# Patient Record
Sex: Female | Born: 1955
Health system: Southern US, Community
[De-identification: ages and names within clinical notes are randomized; demographics above are authoritative.]

## PROBLEM LIST (undated history)

## (undated) DIAGNOSIS — F4310 Post-traumatic stress disorder, unspecified: Secondary | ICD-10-CM

## (undated) DIAGNOSIS — E119 Type 2 diabetes mellitus without complications: Secondary | ICD-10-CM

## (undated) DIAGNOSIS — R32 Unspecified urinary incontinence: Secondary | ICD-10-CM

## (undated) DIAGNOSIS — F329 Major depressive disorder, single episode, unspecified: Secondary | ICD-10-CM

## (undated) DIAGNOSIS — G4733 Obstructive sleep apnea (adult) (pediatric): Secondary | ICD-10-CM

## (undated) DIAGNOSIS — D72829 Elevated white blood cell count, unspecified: Secondary | ICD-10-CM

## (undated) DIAGNOSIS — IMO0002 Reserved for concepts with insufficient information to code with codable children: Secondary | ICD-10-CM

## (undated) DIAGNOSIS — E785 Hyperlipidemia, unspecified: Secondary | ICD-10-CM

## (undated) DIAGNOSIS — E875 Hyperkalemia: Secondary | ICD-10-CM

## (undated) DIAGNOSIS — G4700 Insomnia, unspecified: Secondary | ICD-10-CM

## (undated) DIAGNOSIS — J309 Allergic rhinitis, unspecified: Secondary | ICD-10-CM

## (undated) DIAGNOSIS — I10 Essential (primary) hypertension: Secondary | ICD-10-CM

## (undated) DIAGNOSIS — E1351 Other specified diabetes mellitus with diabetic peripheral angiopathy without gangrene: Secondary | ICD-10-CM

## (undated) DIAGNOSIS — E1365 Other specified diabetes mellitus with hyperglycemia: Secondary | ICD-10-CM

## (undated) HISTORY — PX: CARPAL TUNNEL RELEASE: SHX101

## (undated) HISTORY — DX: Reserved for concepts with insufficient information to code with codable children: IMO0002

## (undated) HISTORY — DX: Other specified diabetes mellitus with diabetic peripheral angiopathy without gangrene: E13.51

## (undated) HISTORY — DX: Type 2 diabetes mellitus without complications: E11.9

## (undated) HISTORY — DX: Other specified diabetes mellitus with hyperglycemia: E13.65

## (undated) HISTORY — DX: Post-traumatic stress disorder, unspecified: F43.10

## (undated) HISTORY — DX: Essential (primary) hypertension: I10

## (undated) HISTORY — PX: COLONOSCOPY: SHX174

## (undated) HISTORY — DX: Allergic rhinitis, unspecified: J30.9

## (undated) HISTORY — DX: Insomnia, unspecified: G47.00

## (undated) HISTORY — DX: Hyperlipidemia, unspecified: E78.5

## (undated) HISTORY — DX: Hyperkalemia: E87.5

## (undated) HISTORY — DX: Elevated white blood cell count, unspecified: D72.829

## (undated) HISTORY — DX: Major depressive disorder, single episode, unspecified: F32.9

---

## 2016-01-10 DIAGNOSIS — E119 Type 2 diabetes mellitus without complications: Secondary | ICD-10-CM | POA: Diagnosis not present

## 2016-01-10 DIAGNOSIS — I10 Essential (primary) hypertension: Secondary | ICD-10-CM | POA: Diagnosis not present

## 2016-01-10 DIAGNOSIS — Z7984 Long term (current) use of oral hypoglycemic drugs: Secondary | ICD-10-CM | POA: Diagnosis not present

## 2016-01-10 DIAGNOSIS — Z794 Long term (current) use of insulin: Secondary | ICD-10-CM | POA: Diagnosis not present

## 2016-01-10 DIAGNOSIS — E781 Pure hyperglyceridemia: Secondary | ICD-10-CM | POA: Diagnosis not present

## 2016-01-10 DIAGNOSIS — R928 Other abnormal and inconclusive findings on diagnostic imaging of breast: Secondary | ICD-10-CM | POA: Diagnosis not present

## 2016-01-10 DIAGNOSIS — R413 Other amnesia: Secondary | ICD-10-CM | POA: Diagnosis not present

## 2016-01-11 ENCOUNTER — Other Ambulatory Visit: Payer: Self-pay | Admitting: Family Medicine

## 2016-01-11 DIAGNOSIS — R928 Other abnormal and inconclusive findings on diagnostic imaging of breast: Secondary | ICD-10-CM

## 2016-01-30 ENCOUNTER — Ambulatory Visit: Payer: Self-pay | Admitting: Neurology

## 2016-02-07 ENCOUNTER — Encounter: Payer: Self-pay | Admitting: Endocrinology

## 2016-02-07 ENCOUNTER — Ambulatory Visit (INDEPENDENT_AMBULATORY_CARE_PROVIDER_SITE_OTHER): Payer: Commercial Managed Care - HMO | Admitting: Endocrinology

## 2016-02-07 VITALS — BP 126/87 | HR 73 | Temp 97.6°F | Ht 65.5 in | Wt 220.0 lb

## 2016-02-07 DIAGNOSIS — J309 Allergic rhinitis, unspecified: Secondary | ICD-10-CM | POA: Diagnosis not present

## 2016-02-07 DIAGNOSIS — G4700 Insomnia, unspecified: Secondary | ICD-10-CM

## 2016-02-07 DIAGNOSIS — F329 Major depressive disorder, single episode, unspecified: Secondary | ICD-10-CM

## 2016-02-07 DIAGNOSIS — I10 Essential (primary) hypertension: Secondary | ICD-10-CM

## 2016-02-07 DIAGNOSIS — F32A Depression, unspecified: Secondary | ICD-10-CM

## 2016-02-07 NOTE — Patient Instructions (Addendum)
good diet and exercise significantly improve the control of your diabetes.  please let me know if you wish to be referred to a dietician.  high blood sugar is very risky to your health.  you should see an eye doctor and dentist every year.  It is very important to get all recommended vaccinations.  controlling your blood pressure and cholesterol drastically reduces the damage diabetes does to your body.  Those who smoke should quit.  please discuss these with your doctor.  check your blood sugar twice a day.  vary the time of day when you check, between before the 3 meals, and at bedtime.  also check if you have symptoms of your blood sugar being too high or too low.  please keep a record of the readings and bring it to your next appointment here (or you can bring the meter itself).  You can write it on any piece of paper.  please call us sooner if your blood sugar goes below 70, or if you have a lot of readings over 200.  For now, change the insulin to 55 units in the morning and 30 units in the evening. On this type of insulin schedule, you should eat meals on a regular schedule.  If a meal is missed or significantly delayed, your blood sugar could go low.   Please come back for a follow-up appointment in 1 month.  Please call us next week, to tell us how the blood sugar is doing.

## 2016-02-07 NOTE — Progress Notes (Signed)
Subjective:    Patient ID: Danielle Buchanan, female    DOB: [birth year], 60 y.o.   MRN: [id number]  HPI pt states DM was dx'ed in 1992; she has severe neuropathy of the lower extremities; she is unaware of any associated chronic complications; she has been on insulin since 1997; pt says her diet and exercise are "ok;"  she has never had GDM, pancreatitis, severe hypoglycemia or DKA.  She says she misses approx 3 doses of insulin per week.  She says she cannot afford insulin analogs.  She has been on 38 units BID, x the past year.  She says cbg's are in the 200's.   Past Medical History  Diagnosis Date  . Diabetes (HCC)   . Dyslipidemia   . HTN (hypertension)   . Allergic rhinitis   . Insomnia   . Depression     No past surgical history on file.  Social History   Social History  . Marital Status: N/A    Spouse Name: N/A  . Number of Children: N/A  . Years of Education: N/A   Occupational History  . Not on file.   Social History Main Topics  . Smoking status: Current Every Day Smoker  . Smokeless tobacco: Not on file  . Alcohol Use: No  . Drug Use: Not on file  . Sexual Activity: Not on file   Other Topics Concern  . Not on file   Social History Narrative    No current outpatient prescriptions on file prior to visit.   No current facility-administered medications on file prior to visit.    Allergies  Allergen Reactions  . Doxycycline Rash    Family History  Problem Relation Age of Onset  . Diabetes Neg Hx     BP 126/87 mmHg  Pulse 73  Temp(Src) 97.6 F (36.4 C) (Oral)  Ht 5' 5.5" (1.664 m)  Wt 220 lb (99.791 kg)  BMI 36.04 kg/m2  SpO2 91%   Review of Systems denies weight loss, blurry vision, headache, chest pain, sob, n/v, muscle cramps, cold intolerance, rhinorrhea, and easy bruising.  She has urinary frequency, memory loss, and diaphoresis.     Objective:   Physical Exam VS: see vs page GEN: no distress HEAD: head: no deformity eyes: no  periorbital swelling, no proptosis external nose and ears are normal mouth: no lesion seen NECK: supple, thyroid is not enlarged CHEST WALL: no deformity LUNGS: clear to auscultation CV: reg rate and rhythm, no murmur ABD: abdomen is soft, nontender.  no hepatosplenomegaly.  not distended.  no hernia MUSCULOSKELETAL: muscle bulk and strength are grossly normal.  no obvious joint swelling.  gait is normal and steady EXTEMITIES: no deformity.  no ulcer on the feet.  feet are of normal color and temp.  no edema.  Both great toenails are absent.  At the plantar aspect of the left foot, there is a cracked callus.   PULSES: dorsalis pedis intact bilat.  no carotid bruit NEURO:  cn 2-12 grossly intact.   readily moves all 4's.  sensation is intact to touch on the feet, but severely decreased from normal SKIN:  Normal texture and temperature.  No rash or suspicious lesion is visible.   NODES:  None palpable at the neck PSYCH: alert, well-oriented.  Does not appear anxious nor depressed.   I have reviewed outside records, and summarized: Pt was noted to have elevated a1c, and referred here.  outside test results are reviewed: A1c=10%    Assessment &  Plan:  DM: severe exacerbation. HTN: new to me: well-controlled.  Please continue the same medication.   Patient is advised the following: Patient Instructions  good diet and exercise significantly improve the control of your diabetes.  please let me know if you wish to be referred to a dietician.  high blood sugar is very risky to your health.  you should see an eye doctor and dentist every year.  It is very important to get all recommended vaccinations.  controlling your blood pressure and cholesterol drastically reduces the damage diabetes does to your body.  Those who smoke should quit.  please discuss these with your doctor.  check your blood sugar twice a day.  vary the time of day when you check, between before the 3 meals, and at bedtime.   also check if you have symptoms of your blood sugar being too high or too low.  please keep a record of the readings and bring it to your next appointment here (or you can bring the meter itself).  You can write it on any piece of paper.  please call us sooner if your blood sugar goes below 70, or if you have a lot of readings over 200.  For now, change the insulin to 55 units in the morning and 30 units in the evening. On this type of insulin schedule, you should eat meals on a regular schedule.  If a meal is missed or significantly delayed, your blood sugar could go low.   Please come back for a follow-up appointment in 1 month.  Please call us next week, to tell us how the blood sugar is doing.

## 2016-02-08 ENCOUNTER — Encounter: Payer: Self-pay | Admitting: Endocrinology

## 2016-02-08 DIAGNOSIS — G4700 Insomnia, unspecified: Secondary | ICD-10-CM | POA: Insufficient documentation

## 2016-02-08 DIAGNOSIS — I10 Essential (primary) hypertension: Secondary | ICD-10-CM | POA: Insufficient documentation

## 2016-02-08 DIAGNOSIS — E119 Type 2 diabetes mellitus without complications: Secondary | ICD-10-CM | POA: Insufficient documentation

## 2016-02-08 DIAGNOSIS — J309 Allergic rhinitis, unspecified: Secondary | ICD-10-CM | POA: Insufficient documentation

## 2016-02-08 DIAGNOSIS — F329 Major depressive disorder, single episode, unspecified: Secondary | ICD-10-CM | POA: Insufficient documentation

## 2016-02-13 DIAGNOSIS — E781 Pure hyperglyceridemia: Secondary | ICD-10-CM | POA: Diagnosis not present

## 2016-02-15 ENCOUNTER — Encounter: Payer: Self-pay | Admitting: Neurology

## 2016-02-15 ENCOUNTER — Other Ambulatory Visit (INDEPENDENT_AMBULATORY_CARE_PROVIDER_SITE_OTHER): Payer: Commercial Managed Care - HMO

## 2016-02-15 ENCOUNTER — Ambulatory Visit (INDEPENDENT_AMBULATORY_CARE_PROVIDER_SITE_OTHER): Payer: Commercial Managed Care - HMO | Admitting: Neurology

## 2016-02-15 VITALS — BP 124/70 | HR 75 | Ht 65.5 in | Wt 227.1 lb

## 2016-02-15 DIAGNOSIS — F329 Major depressive disorder, single episode, unspecified: Secondary | ICD-10-CM | POA: Diagnosis not present

## 2016-02-15 DIAGNOSIS — F17200 Nicotine dependence, unspecified, uncomplicated: Secondary | ICD-10-CM | POA: Diagnosis not present

## 2016-02-15 DIAGNOSIS — E1142 Type 2 diabetes mellitus with diabetic polyneuropathy: Secondary | ICD-10-CM

## 2016-02-15 DIAGNOSIS — R413 Other amnesia: Secondary | ICD-10-CM

## 2016-02-15 DIAGNOSIS — F32A Depression, unspecified: Secondary | ICD-10-CM

## 2016-02-15 LAB — VITAMIN B12: Vitamin B-12: 294 pg/mL (ref 211–911)

## 2016-02-15 LAB — TSH: TSH: 2.34 u[IU]/mL (ref 0.35–4.50)

## 2016-02-15 NOTE — Patient Instructions (Addendum)
1.  MRI brain without contrast 2.  Check blood work 3.  Neuropsychological testing 4.  It is important for you to have regular sleep and diet habits.  Advised to exercise daily and engage in mentally stimulating activities such as puzzles to maintain your brain health 5.  Stop smoking  Return to clinic in 4 months
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
## 2016-02-16 NOTE — Progress Notes (Signed)
Note routed

## 2016-02-23 ENCOUNTER — Inpatient Hospital Stay: Admission: RE | Admit: 2016-02-23 | Payer: 59 | Source: Ambulatory Visit

## 2016-02-23 ENCOUNTER — Ambulatory Visit
Admission: RE | Admit: 2016-02-23 | Discharge: 2016-02-23 | Disposition: A | Discharge status: Home / Self Care | Payer: Commercial Managed Care - HMO | Source: Ambulatory Visit | Attending: Neurology | Admitting: Neurology

## 2016-02-23 DIAGNOSIS — F329 Major depressive disorder, single episode, unspecified: Secondary | ICD-10-CM

## 2016-02-23 DIAGNOSIS — R413 Other amnesia: Secondary | ICD-10-CM | POA: Diagnosis not present

## 2016-02-23 DIAGNOSIS — F17200 Nicotine dependence, unspecified, uncomplicated: Secondary | ICD-10-CM

## 2016-02-23 DIAGNOSIS — F32A Depression, unspecified: Secondary | ICD-10-CM

## 2016-02-23 DIAGNOSIS — E1142 Type 2 diabetes mellitus with diabetic polyneuropathy: Secondary | ICD-10-CM

## 2016-03-02 DIAGNOSIS — R413 Other amnesia: Secondary | ICD-10-CM | POA: Diagnosis not present

## 2016-03-09 ENCOUNTER — Ambulatory Visit: Payer: 59 | Admitting: Endocrinology

## 2016-04-11 ENCOUNTER — Ambulatory Visit: Payer: 59 | Admitting: Endocrinology

## 2016-04-12 ENCOUNTER — Encounter: Payer: Commercial Managed Care - HMO | Admitting: Psychology

## 2016-04-12 ENCOUNTER — Ambulatory Visit (INDEPENDENT_AMBULATORY_CARE_PROVIDER_SITE_OTHER): Payer: Commercial Managed Care - HMO | Admitting: Psychology

## 2016-04-12 ENCOUNTER — Encounter: Payer: Self-pay | Admitting: Psychology

## 2016-04-12 DIAGNOSIS — F329 Major depressive disorder, single episode, unspecified: Secondary | ICD-10-CM | POA: Diagnosis not present

## 2016-04-12 DIAGNOSIS — R413 Other amnesia: Secondary | ICD-10-CM

## 2016-04-12 DIAGNOSIS — F32A Depression, unspecified: Secondary | ICD-10-CM
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
## 2016-04-16 ENCOUNTER — Ambulatory Visit (INDEPENDENT_AMBULATORY_CARE_PROVIDER_SITE_OTHER): Payer: Commercial Managed Care - HMO | Admitting: Psychology

## 2016-04-16 DIAGNOSIS — F32A Depression, unspecified: Secondary | ICD-10-CM

## 2016-04-16 DIAGNOSIS — F329 Major depressive disorder, single episode, unspecified: Secondary | ICD-10-CM

## 2016-04-16 DIAGNOSIS — R413 Other amnesia: Secondary | ICD-10-CM | POA: Diagnosis not present

## 2016-04-16 NOTE — Progress Notes (Signed)
   Neuropsychology Note  Danielle Buchanan Ganim returned today for 2 hours of neuropsychological testing with technician, Wallace Kellerana Chamberlain, BS, under the supervision of Dr. Elvis CoilMaryBeth Bailar. The patient did not appear overtly distressed by the testing session, per behavioral observation as reported to me or via self-report to the technician. Rest breaks were offered. Danielle Buchanan Baxley will return within 2 weeks for a feedback session with Dr. Alinda DoomsBailar at which time her test performances, clinical impressions and treatment recommendations will be reviewed in detail. she understands she can contact our office should she require my assistance before this time.  Full report to follow.

## 2016-04-25 ENCOUNTER — Ambulatory Visit: Payer: Commercial Managed Care - HMO | Admitting: Endocrinology

## 2016-04-26 ENCOUNTER — Encounter: Payer: Commercial Managed Care - HMO | Admitting: Psychology

## 2016-04-26 ENCOUNTER — Ambulatory Visit (INDEPENDENT_AMBULATORY_CARE_PROVIDER_SITE_OTHER): Payer: Commercial Managed Care - HMO | Admitting: Psychology

## 2016-04-26 DIAGNOSIS — R413 Other amnesia: Secondary | ICD-10-CM | POA: Diagnosis not present

## 2016-04-26 DIAGNOSIS — F4312 Post-traumatic stress disorder, chronic: Secondary | ICD-10-CM | POA: Diagnosis not present

## 2016-04-26 DIAGNOSIS — F329 Major depressive disorder, single episode, unspecified: Secondary | ICD-10-CM

## 2016-04-26 DIAGNOSIS — F32A Depression, unspecified: Secondary | ICD-10-CM
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
## 2016-04-30 ENCOUNTER — Telehealth: Payer: Self-pay | Admitting: Neurology

## 2016-04-30 ENCOUNTER — Ambulatory Visit (INDEPENDENT_AMBULATORY_CARE_PROVIDER_SITE_OTHER): Payer: Commercial Managed Care - HMO | Admitting: Endocrinology

## 2016-04-30 VITALS — BP 117/70 | HR 66 | Temp 98.4°F | Ht 64.0 in | Wt 224.0 lb

## 2016-04-30 DIAGNOSIS — E1142 Type 2 diabetes mellitus with diabetic polyneuropathy: Secondary | ICD-10-CM | POA: Diagnosis not present

## 2016-04-30 DIAGNOSIS — Z794 Long term (current) use of insulin: Secondary | ICD-10-CM

## 2016-04-30 DIAGNOSIS — E1310 Other specified diabetes mellitus with ketoacidosis without coma: Secondary | ICD-10-CM | POA: Diagnosis not present

## 2016-04-30 LAB — POCT GLYCOSYLATED HEMOGLOBIN (HGB A1C): Hemoglobin A1C: 10.7

## 2016-04-30 NOTE — Progress Notes (Signed)
Subjective:    Patient ID: Danielle Buchanan, female    DOB: 06-16-1956, 60 y.o.   MRN: [id number]  HPI Pt returns for f/u of diabetes mellitus: DM type: Insulin-requiring type 2 Dx'ed: 1992 Complications: polyneuropathy Therapy: insulin since 1997 GDM: never DKA: never Severe hypoglycemia: never Pancreatitis: never Other: she says she cannot afford insulin analogs Interval history: she brings a record of her cbg's which i have reviewed today.  It varies from 176-338.  She checks in am only.  pt states she feels well in general. Past Medical History:  Diagnosis Date  . Allergic rhinitis   . Depression   . Diabetes (HCC)   . Dyslipidemia   . HTN (hypertension)   . Insomnia     Past Surgical History:  Procedure Laterality Date  . CARPAL TUNNEL RELEASE Right     Social History   Social History  . Marital status: Single    Spouse name: N/A  . Number of children: N/A  . Years of education: N/A   Occupational History  . Not on file.   Social History Main Topics  . Smoking status: Current Every Day Smoker    Packs/day: 1.00    Years: 51.00    Types: Cigarettes  . Smokeless tobacco: Never Used  . Alcohol use No  . Drug use: No  . Sexual activity: Not on file   Other Topics Concern  . Not on file   Social History Narrative   Lives with sister in an apartment on the first floor.  Has no children.    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    Education: 3rd grade.    Current Outpatient Prescriptions on File Prior to Visit  Medication Sig Dispense Refill  . atorvastatin (LIPITOR) 40 MG tablet     . B-D INS SYR ULTRAFINE 1CC/31G 31G X 5/16" 1 ML MISC     . buPROPion (WELLBUTRIN XL) 300 MG 24 hr tablet     . cetirizine (ZYRTEC) 10 MG tablet     . fenofibrate micronized (LOFIBRA) 134 MG capsule     . FLUoxetine (PROZAC) 40 MG capsule     . Insulin NPH Human, Isophane, (NOVOLIN N [place]) 55 units in the morning and 30 units in  the evening.    Marland Kitchen losartan (COZAAR) 100 MG tablet Take 100 mg by mouth daily.    . metFORMIN (GLUCOPHAGE) 1000 MG tablet     . traZODone (DESYREL) 50 MG tablet Take 50 mg by mouth at bedtime.    . TRUE METRIX BLOOD GLUCOSE TEST test strip      No current facility-administered medications on file prior to visit.     Allergies  Allergen Reactions  . Doxycycline Rash    Family History  Problem Relation Age of Onset  . Diabetes Neg Hx   . Heart attack Sister   . Heart attack Brother   . Heart attack Mother   . Heart attack Father   . Alcohol abuse Mother   . Alcohol abuse Father   . Hypercholesterolemia Sister     BP 117/70   Pulse 66   Temp 98.4 F (36.9 C) (Oral)   Ht  (1.626 m)   Wt 224 lb (101.6 kg)   SpO2 98%   BMI 38.45 kg/m   Review of Systems She denies hypoglycemia    Objective:   Physical Exam VITAL SIGNS:  See vs page GENERAL: no distress Pulses: dorsalis  pedis intact bilat.   MSK: no deformity of the feet CV: no leg edema Skin:  no ulcer on the feet.  normal color and temp on the feet. Neuro: sensation is intact to touch on the feet, but severely decreased from normal EXTEMITIES: both great toenails are absent. There is bilateral onychomycosis of the toenails.     Lab Results  Component Value Date   HGBA1C 10.7 04/30/2016      Assessment & Plan:  Insulin-requiring type 2 DM: she needs increased rx Noncompliance with cbg recording: i'll do the beat I can for now, but I told pt as cbg's improve, cbg's will become more important.  Patient is advised the following: Patient Instructions  check your blood sugar twice a day.  vary the time of day when you check, between before the 3 meals, and at bedtime.  also check if you have symptoms of your blood sugar being too high or too low.  please keep a record of the readings and bring it to your next appointment here (or you can bring the meter itself).  You can write it on any piece of paper.  please  call us sooner if your blood sugar goes below 70, or if you have a lot of readings over 200.  For now, please increase the insulin to 65 units in the morning and 40 units in the evening. On this type of insulin schedule, you should eat meals on a regular schedule.  If a meal is missed or significantly delayed, your blood sugar could go low.   Please come back for a follow-up appointment in 6 weeks.  Please see our dietician the same day. Please call us next week, to tell us how the blood sugar is doing.    [name], MD

## 2016-04-30 NOTE — Telephone Encounter (Signed)
Patient requested for appointment to be cancelled.

## 2016-04-30 NOTE — Patient Instructions (Addendum)
check your blood sugar twice a day.  vary the time of day when you check, between before the 3 meals, and at bedtime.  also check if you have symptoms of your blood sugar being too high or too low.  please keep a record of the readings and bring it to your next appointment here (or you can bring the meter itself).  You can write it on any piece of paper.  please call us sooner if your blood sugar goes below 70, or if you have a lot of readings over 200.  For now, please increase the insulin to 65 units in the morning and 40 units in the evening. On this type of insulin schedule, you should eat meals on a regular schedule.  If a meal is missed or significantly delayed, your blood sugar could go low.   Please come back for a follow-up appointment in 6 weeks.  Please see our dietician the same day. Please call us next week, to tell us how the blood sugar is doing.

## 2016-05-14 ENCOUNTER — Telehealth: Payer: Self-pay | Admitting: Endocrinology

## 2016-05-14 NOTE — Telephone Encounter (Signed)
PT called wants to make sure she is taking the correct amount of insulin, she thinks that he increased her dose and needs to double check.  Requests call back.

## 2016-05-16 NOTE — Telephone Encounter (Signed)
Called and advised patient of Dr.Ellison's last note regarding to increase insulin to 65 units in the AM and 40 units in the evening. Patient stated that what she had been doing and had no other questions.

## 2016-05-16 NOTE — Telephone Encounter (Signed)
PT called again, wondering how much insulin she needs to take, please call back.

## 2016-05-28 DIAGNOSIS — R413 Other amnesia: Secondary | ICD-10-CM | POA: Diagnosis not present

## 2016-05-28 DIAGNOSIS — F4310 Post-traumatic stress disorder, unspecified: Secondary | ICD-10-CM | POA: Diagnosis not present

## 2016-05-28 DIAGNOSIS — F331 Major depressive disorder, recurrent, moderate: Secondary | ICD-10-CM | POA: Diagnosis not present

## 2016-05-28 DIAGNOSIS — E781 Pure hyperglyceridemia: Secondary | ICD-10-CM | POA: Diagnosis not present

## 2016-05-28 DIAGNOSIS — S069X0S Unspecified intracranial injury without loss of consciousness, sequela: Secondary | ICD-10-CM | POA: Diagnosis not present

## 2016-06-12 ENCOUNTER — Telehealth: Payer: Self-pay | Admitting: Endocrinology

## 2016-06-12 NOTE — Telephone Encounter (Signed)
See message to be advised.  

## 2016-06-12 NOTE — Telephone Encounter (Signed)
Pt states her BS has gone down since the change in the insulin so she is cancelling her appt because according to her conversation with Dr. Everardo AllEllison, she did not need to come back for an appt

## 2016-06-14 ENCOUNTER — Ambulatory Visit: Payer: Commercial Managed Care - HMO | Admitting: Endocrinology

## 2016-06-14 ENCOUNTER — Encounter: Payer: Commercial Managed Care - HMO | Admitting: Dietician

## 2016-06-18 ENCOUNTER — Ambulatory Visit: Payer: 59 | Admitting: Neurology

## 2016-07-02 DIAGNOSIS — Z794 Long term (current) use of insulin: Secondary | ICD-10-CM | POA: Diagnosis not present

## 2016-07-02 DIAGNOSIS — Z23 Encounter for immunization: Secondary | ICD-10-CM | POA: Diagnosis not present

## 2016-07-02 DIAGNOSIS — F5101 Primary insomnia: Secondary | ICD-10-CM | POA: Diagnosis not present

## 2016-07-02 DIAGNOSIS — E119 Type 2 diabetes mellitus without complications: Secondary | ICD-10-CM | POA: Diagnosis not present

## 2016-07-02 DIAGNOSIS — F331 Major depressive disorder, recurrent, moderate: Secondary | ICD-10-CM | POA: Diagnosis not present

## 2016-07-09 DIAGNOSIS — Z9114 Patient's other noncompliance with medication regimen: Secondary | ICD-10-CM | POA: Diagnosis not present

## 2016-07-09 DIAGNOSIS — E119 Type 2 diabetes mellitus without complications: Secondary | ICD-10-CM | POA: Diagnosis not present

## 2016-07-09 DIAGNOSIS — I10 Essential (primary) hypertension: Secondary | ICD-10-CM | POA: Diagnosis not present

## 2016-07-09 DIAGNOSIS — F5101 Primary insomnia: Secondary | ICD-10-CM | POA: Diagnosis not present

## 2016-07-09 DIAGNOSIS — R413 Other amnesia: Secondary | ICD-10-CM | POA: Diagnosis not present

## 2016-07-17 DIAGNOSIS — I10 Essential (primary) hypertension: Secondary | ICD-10-CM | POA: Diagnosis not present

## 2016-07-17 DIAGNOSIS — Z9114 Patient's other noncompliance with medication regimen: Secondary | ICD-10-CM | POA: Diagnosis not present

## 2016-07-17 DIAGNOSIS — R413 Other amnesia: Secondary | ICD-10-CM | POA: Diagnosis not present

## 2016-07-17 DIAGNOSIS — F5101 Primary insomnia: Secondary | ICD-10-CM | POA: Diagnosis not present

## 2016-07-17 DIAGNOSIS — E119 Type 2 diabetes mellitus without complications: Secondary | ICD-10-CM | POA: Diagnosis not present

## 2016-07-24 DIAGNOSIS — Z9114 Patient's other noncompliance with medication regimen: Secondary | ICD-10-CM | POA: Diagnosis not present

## 2016-07-24 DIAGNOSIS — E119 Type 2 diabetes mellitus without complications: Secondary | ICD-10-CM | POA: Diagnosis not present

## 2016-07-24 DIAGNOSIS — R413 Other amnesia: Secondary | ICD-10-CM | POA: Diagnosis not present

## 2016-07-24 DIAGNOSIS — I10 Essential (primary) hypertension: Secondary | ICD-10-CM | POA: Diagnosis not present

## 2016-07-24 DIAGNOSIS — F5101 Primary insomnia: Secondary | ICD-10-CM | POA: Diagnosis not present

## 2016-07-31 DIAGNOSIS — E119 Type 2 diabetes mellitus without complications: Secondary | ICD-10-CM | POA: Diagnosis not present

## 2016-07-31 DIAGNOSIS — I10 Essential (primary) hypertension: Secondary | ICD-10-CM | POA: Diagnosis not present

## 2016-07-31 DIAGNOSIS — Z9114 Patient's other noncompliance with medication regimen: Secondary | ICD-10-CM | POA: Diagnosis not present

## 2016-07-31 DIAGNOSIS — R413 Other amnesia: Secondary | ICD-10-CM | POA: Diagnosis not present

## 2016-07-31 DIAGNOSIS — F5101 Primary insomnia: Secondary | ICD-10-CM | POA: Diagnosis not present

## 2016-08-07 DIAGNOSIS — I10 Essential (primary) hypertension: Secondary | ICD-10-CM | POA: Diagnosis not present

## 2016-08-07 DIAGNOSIS — F5101 Primary insomnia: Secondary | ICD-10-CM | POA: Diagnosis not present

## 2016-08-07 DIAGNOSIS — E119 Type 2 diabetes mellitus without complications: Secondary | ICD-10-CM | POA: Diagnosis not present

## 2016-08-07 DIAGNOSIS — R413 Other amnesia: Secondary | ICD-10-CM | POA: Diagnosis not present

## 2016-08-07 DIAGNOSIS — Z9114 Patient's other noncompliance with medication regimen: Secondary | ICD-10-CM | POA: Diagnosis not present

## 2017-05-03 DIAGNOSIS — M79672 Pain in left foot: Secondary | ICD-10-CM | POA: Diagnosis not present

## 2017-05-03 DIAGNOSIS — L089 Local infection of the skin and subcutaneous tissue, unspecified: Secondary | ICD-10-CM | POA: Diagnosis not present

## 2017-05-03 DIAGNOSIS — E1165 Type 2 diabetes mellitus with hyperglycemia: Secondary | ICD-10-CM | POA: Diagnosis not present

## 2017-05-03 DIAGNOSIS — Z794 Long term (current) use of insulin: Secondary | ICD-10-CM | POA: Diagnosis not present

## 2017-05-03 DIAGNOSIS — E781 Pure hyperglyceridemia: Secondary | ICD-10-CM | POA: Diagnosis not present

## 2017-05-03 DIAGNOSIS — E119 Type 2 diabetes mellitus without complications: Secondary | ICD-10-CM | POA: Diagnosis not present

## 2017-05-03 DIAGNOSIS — E782 Mixed hyperlipidemia: Secondary | ICD-10-CM | POA: Diagnosis not present

## 2017-05-03 DIAGNOSIS — M79671 Pain in right foot: Secondary | ICD-10-CM | POA: Diagnosis not present

## 2017-05-08 ENCOUNTER — Other Ambulatory Visit (HOSPITAL_COMMUNITY): Payer: Self-pay | Admitting: Family Medicine

## 2017-05-08 DIAGNOSIS — M79672 Pain in left foot: Secondary | ICD-10-CM

## 2017-05-08 DIAGNOSIS — M79671 Pain in right foot: Secondary | ICD-10-CM

## 2017-05-09 ENCOUNTER — Ambulatory Visit (INDEPENDENT_AMBULATORY_CARE_PROVIDER_SITE_OTHER)
Admission: RE | Admit: 2017-05-09 | Discharge: 2017-05-09 | Disposition: A | Discharge status: Home / Self Care | Payer: Medicare HMO | Source: Ambulatory Visit | Attending: Vascular Surgery | Admitting: Vascular Surgery

## 2017-05-09 ENCOUNTER — Ambulatory Visit (HOSPITAL_COMMUNITY)
Admission: RE | Admit: 2017-05-09 | Discharge: 2017-05-09 | Disposition: A | Discharge status: Home / Self Care | Payer: Medicare HMO | Source: Ambulatory Visit | Attending: Vascular Surgery | Admitting: Vascular Surgery

## 2017-05-09 DIAGNOSIS — R931 Abnormal findings on diagnostic imaging of heart and coronary circulation: Secondary | ICD-10-CM | POA: Diagnosis not present

## 2017-05-09 DIAGNOSIS — M79672 Pain in left foot: Secondary | ICD-10-CM | POA: Diagnosis not present

## 2017-05-09 DIAGNOSIS — M79671 Pain in right foot: Secondary | ICD-10-CM | POA: Diagnosis not present

## 2017-05-09 LAB — VAS US LOWER EXTREMITY ARTERIAL DUPLEX
LATIBDISTSYS: -17 cm/s
LSFPPSV: 138 cm/s
Left super femoral dist sys PSV: -143 cm/s
Left super femoral mid sys PSV: -108 cm/s
RATIBDISTSYS: 94 cm/s
RIGHT POST TIB DIST SYS: 57 cm/s
RSFDPSV: -98 cm/s
Right super femoral mid sys PSV: -117 cm/s
Right super femoral prox sys PSV: 106 cm/s
left post tibial dist sys: -59 cm/s

## 2017-05-13 ENCOUNTER — Encounter (HOSPITAL_COMMUNITY): Payer: Commercial Managed Care - HMO

## 2017-05-24 ENCOUNTER — Encounter: Payer: Self-pay | Admitting: Endocrinology

## 2017-05-24 ENCOUNTER — Ambulatory Visit (INDEPENDENT_AMBULATORY_CARE_PROVIDER_SITE_OTHER): Payer: Medicare HMO | Admitting: Endocrinology

## 2017-05-24 VITALS — BP 132/86 | HR 84 | Wt 234.2 lb

## 2017-05-24 DIAGNOSIS — E1142 Type 2 diabetes mellitus with diabetic polyneuropathy: Secondary | ICD-10-CM

## 2017-05-24 DIAGNOSIS — R21 Rash and other nonspecific skin eruption: Secondary | ICD-10-CM | POA: Insufficient documentation

## 2017-05-24 DIAGNOSIS — Z794 Long term (current) use of insulin: Secondary | ICD-10-CM

## 2017-05-24 LAB — POCT GLYCOSYLATED HEMOGLOBIN (HGB A1C): Hemoglobin A1C: 12.6

## 2017-05-24 MED ORDER — INSULIN NPH ISOPHANE & REGULAR (70-30) 100 UNIT/ML ~~LOC~~ SUSP: SUBCUTANEOUS | 11 refills | Status: DC

## 2017-05-24 NOTE — Patient Instructions (Addendum)
check your blood sugar twice a day.  vary the time of day when you check, between before the 3 meals, and at bedtime.  also check if you have symptoms of your blood sugar being too high or too low.  please keep a record of the readings and bring it to your next appointment here (or you can bring the meter itself).  You can write it on any piece of paper.  please call us sooner if your blood sugar goes below 70, or if you have a lot of readings over 200.  For now, please increase the insulin to 120 units with breakfast, and 90 units with the evening meal. On this type of insulin schedule, you should eat meals on a regular schedule.  If a meal is missed or significantly delayed, your blood sugar could go low.   Please come back for a follow-up appointment in 2 months.  Please call us next week, to tell us how the blood sugar is doing.   Please see a skin specialist.  you will receive a phone call, about a day and time for an appointment.

## 2017-05-24 NOTE — Progress Notes (Signed)
Subjective:    Patient ID: Danielle Buchanan, female    DOB: 1956/06/06, 61 y.o.   MRN: [id number]  HPI Pt returns for f/u of diabetes mellitus: DM type: Insulin-requiring type 2 Dx'ed: 1992 Complications: polyneuropathy and PAD Therapy: insulin since 1997 GDM: never DKA: never Severe hypoglycemia: never.  Pancreatitis: never Other: she says she cannot afford insulin analogs.   Interval history: no cbg record, but states cbg's are in the 200's.  It is in general higher as the day goes on.  She says she never misses the insulin.  She takes 70/30, 90 units BID.  She has been on this dosage x a few weeks.  She says a vial lasts a few days.  Past Medical History:  Diagnosis Date  . Allergic rhinitis   . Depression   . Diabetes (HCC)   . Dyslipidemia   . HTN (hypertension)   . Insomnia     Past Surgical History:  Procedure Laterality Date  . CARPAL TUNNEL RELEASE Right     Social History   Social History  . Marital status: Single    Spouse name: N/A  . Number of children: N/A  . Years of education: N/A   Occupational History  . Not on file.   Social History Main Topics  . Smoking status: Current Every Day Smoker    Packs/day: 1.00    Years: 51.00    Types: Cigarettes  . Smokeless tobacco: Never Used  . Alcohol use No  . Drug use: No  . Sexual activity: Not on file   Other Topics Concern  . Not on file   Social History Narrative   Lives with sister in an apartment on the first floor.  Has no children.    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    Education: 3rd grade.    Current Outpatient Prescriptions on File Prior to Visit  Medication Sig Dispense Refill  . atorvastatin (LIPITOR) 40 MG tablet     . B-D INS SYR ULTRAFINE 1CC/31G 31G X 5/16" 1 ML MISC     . losartan (COZAAR) 100 MG tablet Take 100 mg by mouth daily.    . traZODone (DESYREL) 50 MG tablet Take 50 mg by mouth at bedtime.    . TRUE METRIX BLOOD GLUCOSE  TEST test strip     . buPROPion (WELLBUTRIN XL) 300 MG 24 hr tablet     . cetirizine (ZYRTEC) 10 MG tablet     . fenofibrate micronized (LOFIBRA) 134 MG capsule     . FLUoxetine (PROZAC) 40 MG capsule     . Insulin NPH Human, Isophane, (NOVOLIN N Vansant) 55 units in the morning and 30 units in the evening.     No current facility-administered medications on file prior to visit.     Allergies  Allergen Reactions  . Doxycycline Rash    Family History  Problem Relation Age of Onset  . Diabetes Neg Hx   . Heart attack Sister   . Heart attack Brother   . Heart attack Mother   . Heart attack Father   . Alcohol abuse Mother   . Alcohol abuse Father   . Hypercholesterolemia Sister     BP 132/86   Pulse 84   Wt 234 lb 3.2 oz (106.2 kg)   SpO2 94%   BMI 40.20 kg/m    Review of Systems She denies hypoglycemia.  She has a rash on the feet (does not  recall burning herself)    Objective:   Physical Exam VITAL SIGNS:  See vs page GENERAL: no distress Pulses: dorsalis pedis intact bilat.   MSK: no deformity of the feet CV: no leg edema Skin:  no ulcer on the feet, but there is severe erythema and exfoliation of the distal aspect of both feet.  normal color and temp on the feet. Neuro: sensation is intact to touch on the feet, but severely decreased from normal.   EXTEMITIES: both great toenails are absent. There is bilateral onychomycosis of the toenails.     (i reviewed arterial doppler report).    Lab Results  Component Value Date   HGBA1C 12.6 05/24/2017      Assessment & Plan:  Insulin-requiring type 2 DM, with polyneuropathy: ongoing poor control Noncompliance with cbg recording and f/u ov's.  This complicates the rx of DM Rash, new to me, uncertain etiology PAD: this degree does not explain the rash.  Patient Instructions  check your blood sugar twice a day.  vary the time of day when you check, between before the 3 meals, and at bedtime.  also check if you have  symptoms of your blood sugar being too high or too low.  please keep a record of the readings and bring it to your next appointment here (or you can bring the meter itself).  You can write it on any piece of paper.  please call us sooner if your blood sugar goes below 70, or if you have a lot of readings over 200.  For now, please increase the insulin to 120 units with breakfast, and 90 units with the evening meal. On this type of insulin schedule, you should eat meals on a regular schedule.  If a meal is missed or significantly delayed, your blood sugar could go low.   Please come back for a follow-up appointment in 2 months.  Please call us next week, to tell us how the blood sugar is doing.   Please see a skin specialist.  you will receive a phone call, about a day and time for an appointment.

## 2017-05-27 ENCOUNTER — Telehealth: Payer: Self-pay | Admitting: Endocrinology

## 2017-05-27 NOTE — Telephone Encounter (Signed)
Called patient & told her because referrals take time to call PCP to see if they could see her sooner.

## 2017-05-27 NOTE — Telephone Encounter (Signed)
See message.  Thanks.

## 2017-05-27 NOTE — Telephone Encounter (Signed)
See below.  Thank you

## 2017-05-27 NOTE — Telephone Encounter (Signed)
Patient has questions about her dermatology referral. Call patient at [phone] to advise, okay to leave a detailed message on phone. Patient requested a call this morning, I did not guarantee the patient that it would be possible due to patient load however, that I would put the note in the chart.

## 2017-05-27 NOTE — Telephone Encounter (Signed)
I did referral.  However, besides that, I don't know what to do.  This is outside the scope of my practice, so you would have to ask PCP.

## 2017-05-27 NOTE — Telephone Encounter (Signed)
Patient called to check the status of the note below. I advised the patient that the nurse is waiting to hear back from Dr. Everardo All on where she needs to go for her referral and someone will call once they have an update. Please advise.

## 2017-05-27 NOTE — Telephone Encounter (Signed)
Called patient & wanted referral status. I told her that I would inquire as to where it was placed, as well as seek advice on what to do about her worsening rash.

## 2017-05-28 DIAGNOSIS — L03115 Cellulitis of right lower limb: Secondary | ICD-10-CM | POA: Diagnosis not present

## 2017-05-28 DIAGNOSIS — G629 Polyneuropathy, unspecified: Secondary | ICD-10-CM | POA: Diagnosis not present

## 2017-05-28 DIAGNOSIS — L03116 Cellulitis of left lower limb: Secondary | ICD-10-CM | POA: Diagnosis not present

## 2017-05-30 DIAGNOSIS — Z7984 Long term (current) use of oral hypoglycemic drugs: Secondary | ICD-10-CM | POA: Diagnosis not present

## 2017-05-30 DIAGNOSIS — R296 Repeated falls: Secondary | ICD-10-CM | POA: Diagnosis not present

## 2017-05-30 DIAGNOSIS — Z794 Long term (current) use of insulin: Secondary | ICD-10-CM | POA: Diagnosis not present

## 2017-05-30 DIAGNOSIS — E1121 Type 2 diabetes mellitus with diabetic nephropathy: Secondary | ICD-10-CM | POA: Diagnosis not present

## 2017-05-30 DIAGNOSIS — L97512 Non-pressure chronic ulcer of other part of right foot with fat layer exposed: Secondary | ICD-10-CM | POA: Diagnosis not present

## 2017-06-03 ENCOUNTER — Ambulatory Visit: Payer: Medicare HMO | Admitting: Surgery

## 2017-06-05 ENCOUNTER — Encounter (HOSPITAL_BASED_OUTPATIENT_CLINIC_OR_DEPARTMENT_OTHER): Payer: Medicare HMO | Attending: Surgery

## 2017-06-05 DIAGNOSIS — E11621 Type 2 diabetes mellitus with foot ulcer: Secondary | ICD-10-CM | POA: Insufficient documentation

## 2017-06-05 DIAGNOSIS — E785 Hyperlipidemia, unspecified: Secondary | ICD-10-CM | POA: Diagnosis not present

## 2017-06-05 DIAGNOSIS — E1165 Type 2 diabetes mellitus with hyperglycemia: Secondary | ICD-10-CM | POA: Insufficient documentation

## 2017-06-05 DIAGNOSIS — L97512 Non-pressure chronic ulcer of other part of right foot with fat layer exposed: Secondary | ICD-10-CM | POA: Insufficient documentation

## 2017-06-05 DIAGNOSIS — E1152 Type 2 diabetes mellitus with diabetic peripheral angiopathy with gangrene: Secondary | ICD-10-CM | POA: Insufficient documentation

## 2017-06-05 DIAGNOSIS — I10 Essential (primary) hypertension: Secondary | ICD-10-CM | POA: Diagnosis not present

## 2017-06-05 DIAGNOSIS — I70235 Atherosclerosis of native arteries of right leg with ulceration of other part of foot: Secondary | ICD-10-CM | POA: Diagnosis not present

## 2017-06-05 DIAGNOSIS — I70245 Atherosclerosis of native arteries of left leg with ulceration of other part of foot: Secondary | ICD-10-CM | POA: Diagnosis not present

## 2017-06-05 DIAGNOSIS — L97522 Non-pressure chronic ulcer of other part of left foot with fat layer exposed: Secondary | ICD-10-CM | POA: Diagnosis not present

## 2017-06-05 DIAGNOSIS — I96 Gangrene, not elsewhere classified: Secondary | ICD-10-CM | POA: Diagnosis not present

## 2017-06-05 DIAGNOSIS — Z7984 Long term (current) use of oral hypoglycemic drugs: Secondary | ICD-10-CM | POA: Diagnosis not present

## 2017-06-05 DIAGNOSIS — F17210 Nicotine dependence, cigarettes, uncomplicated: Secondary | ICD-10-CM | POA: Diagnosis not present

## 2017-06-05 DIAGNOSIS — F17218 Nicotine dependence, cigarettes, with other nicotine-induced disorders: Secondary | ICD-10-CM | POA: Diagnosis not present

## 2017-06-05 DIAGNOSIS — Z794 Long term (current) use of insulin: Secondary | ICD-10-CM | POA: Diagnosis not present

## 2017-06-05 DIAGNOSIS — E1140 Type 2 diabetes mellitus with diabetic neuropathy, unspecified: Secondary | ICD-10-CM | POA: Insufficient documentation

## 2017-06-09 ENCOUNTER — Inpatient Hospital Stay (HOSPITAL_COMMUNITY)
Admission: EM | Admit: 2017-06-09 | Discharge: 2017-06-11 | DRG: 638 | Disposition: A | Discharge status: Home / Self Care | Payer: Medicare HMO | Attending: Internal Medicine | Admitting: Internal Medicine

## 2017-06-09 ENCOUNTER — Emergency Department (HOSPITAL_COMMUNITY): Payer: Medicare HMO

## 2017-06-09 ENCOUNTER — Emergency Department (HOSPITAL_COMMUNITY)
Admission: EM | Admit: 2017-06-09 | Discharge: 2017-06-09 | Discharge status: Against Medical Advice | Payer: Medicare HMO | Source: Home / Self Care

## 2017-06-09 ENCOUNTER — Encounter (HOSPITAL_COMMUNITY): Payer: Self-pay | Admitting: Emergency Medicine

## 2017-06-09 DIAGNOSIS — L0390 Cellulitis, unspecified: Secondary | ICD-10-CM | POA: Diagnosis not present

## 2017-06-09 DIAGNOSIS — L97519 Non-pressure chronic ulcer of other part of right foot with unspecified severity: Secondary | ICD-10-CM | POA: Diagnosis present

## 2017-06-09 DIAGNOSIS — E871 Hypo-osmolality and hyponatremia: Secondary | ICD-10-CM | POA: Diagnosis not present

## 2017-06-09 DIAGNOSIS — E1142 Type 2 diabetes mellitus with diabetic polyneuropathy: Secondary | ICD-10-CM | POA: Diagnosis not present

## 2017-06-09 DIAGNOSIS — I10 Essential (primary) hypertension: Secondary | ICD-10-CM | POA: Diagnosis not present

## 2017-06-09 DIAGNOSIS — E1140 Type 2 diabetes mellitus with diabetic neuropathy, unspecified: Secondary | ICD-10-CM | POA: Diagnosis present

## 2017-06-09 DIAGNOSIS — E11621 Type 2 diabetes mellitus with foot ulcer: Secondary | ICD-10-CM | POA: Diagnosis not present

## 2017-06-09 DIAGNOSIS — Z79899 Other long term (current) drug therapy: Secondary | ICD-10-CM | POA: Diagnosis not present

## 2017-06-09 DIAGNOSIS — F3289 Other specified depressive episodes: Secondary | ICD-10-CM | POA: Diagnosis present

## 2017-06-09 DIAGNOSIS — G4700 Insomnia, unspecified: Secondary | ICD-10-CM | POA: Diagnosis not present

## 2017-06-09 DIAGNOSIS — Z8249 Family history of ischemic heart disease and other diseases of the circulatory system: Secondary | ICD-10-CM | POA: Diagnosis not present

## 2017-06-09 DIAGNOSIS — E11628 Type 2 diabetes mellitus with other skin complications: Principal | ICD-10-CM | POA: Diagnosis present

## 2017-06-09 DIAGNOSIS — G4733 Obstructive sleep apnea (adult) (pediatric): Secondary | ICD-10-CM | POA: Diagnosis not present

## 2017-06-09 DIAGNOSIS — F17210 Nicotine dependence, cigarettes, uncomplicated: Secondary | ICD-10-CM | POA: Diagnosis not present

## 2017-06-09 DIAGNOSIS — Z8349 Family history of other endocrine, nutritional and metabolic diseases: Secondary | ICD-10-CM | POA: Diagnosis not present

## 2017-06-09 DIAGNOSIS — Z811 Family history of alcohol abuse and dependence: Secondary | ICD-10-CM | POA: Diagnosis not present

## 2017-06-09 DIAGNOSIS — R509 Fever, unspecified: Secondary | ICD-10-CM | POA: Diagnosis not present

## 2017-06-09 DIAGNOSIS — L97518 Non-pressure chronic ulcer of other part of right foot with other specified severity: Secondary | ICD-10-CM | POA: Diagnosis not present

## 2017-06-09 DIAGNOSIS — E1165 Type 2 diabetes mellitus with hyperglycemia: Secondary | ICD-10-CM | POA: Diagnosis present

## 2017-06-09 DIAGNOSIS — L089 Local infection of the skin and subcutaneous tissue, unspecified: Secondary | ICD-10-CM

## 2017-06-09 DIAGNOSIS — E119 Type 2 diabetes mellitus without complications: Secondary | ICD-10-CM

## 2017-06-09 DIAGNOSIS — I70219 Atherosclerosis of native arteries of extremities with intermittent claudication, unspecified extremity: Secondary | ICD-10-CM | POA: Diagnosis not present

## 2017-06-09 DIAGNOSIS — Z794 Long term (current) use of insulin: Secondary | ICD-10-CM | POA: Diagnosis not present

## 2017-06-09 DIAGNOSIS — Z888 Allergy status to other drugs, medicaments and biological substances status: Secondary | ICD-10-CM | POA: Diagnosis not present

## 2017-06-09 DIAGNOSIS — E785 Hyperlipidemia, unspecified: Secondary | ICD-10-CM | POA: Diagnosis present

## 2017-06-09 DIAGNOSIS — I517 Cardiomegaly: Secondary | ICD-10-CM | POA: Diagnosis not present

## 2017-06-09 HISTORY — DX: Obstructive sleep apnea (adult) (pediatric): G47.33

## 2017-06-09 LAB — COMPREHENSIVE METABOLIC PANEL
ALT: 25 U/L (ref 14–54)
ANION GAP: 12 (ref 5–15)
AST: 21 U/L (ref 15–41)
Albumin: 3.6 g/dL (ref 3.5–5.0)
Alkaline Phosphatase: 86 U/L (ref 38–126)
BILIRUBIN TOTAL: 0.5 mg/dL (ref 0.3–1.2)
BUN: 19 mg/dL (ref 6–20)
CO2: 21 mmol/L — ABNORMAL LOW (ref 22–32)
Calcium: 8.9 mg/dL (ref 8.9–10.3)
Chloride: 92 mmol/L — ABNORMAL LOW (ref 101–111)
Creatinine, Ser: 1.19 mg/dL — ABNORMAL HIGH (ref 0.44–1.00)
GFR, EST AFRICAN AMERICAN: 56 mL/min — AB (ref >60)
GFR, EST NON AFRICAN AMERICAN: 48 mL/min — AB (ref >60)
Glucose, Bld: 452 mg/dL — ABNORMAL HIGH (ref 65–99)
POTASSIUM: 4.1 mmol/L (ref 3.5–5.1)
Sodium: 125 mmol/L — ABNORMAL LOW (ref 135–145)
TOTAL PROTEIN: 6.8 g/dL (ref 6.5–8.1)

## 2017-06-09 LAB — CBC WITH DIFFERENTIAL/PLATELET
BASOS ABS: 0 10*3/uL (ref 0.0–0.1)
Basophils Relative: 0 %
EOS PCT: 3 %
Eosinophils Absolute: 0.4 10*3/uL (ref 0.0–0.7)
HEMATOCRIT: 39.7 % (ref 36.0–46.0)
Hemoglobin: 13.4 g/dL (ref 12.0–15.0)
LYMPHS PCT: 16 %
Lymphs Abs: 1.8 10*3/uL (ref 0.7–4.0)
MCH: 29.2 pg (ref 26.0–34.0)
MCHC: 33.8 g/dL (ref 30.0–36.0)
MCV: 86.5 fL (ref 78.0–100.0)
Monocytes Absolute: 0.7 10*3/uL (ref 0.1–1.0)
Monocytes Relative: 6 %
NEUTROS ABS: 8.2 10*3/uL — AB (ref 1.7–7.7)
Neutrophils Relative %: 75 %
PLATELETS: 205 10*3/uL (ref 150–400)
RBC: 4.59 MIL/uL (ref 3.87–5.11)
RDW: 13.4 % (ref 11.5–15.5)
WBC: 11 10*3/uL — ABNORMAL HIGH (ref 4.0–10.5)

## 2017-06-09 LAB — PROTIME-INR
INR: 0.92
Prothrombin Time: 12.3 seconds (ref 11.4–15.2)

## 2017-06-09 LAB — CBG MONITORING, ED: Glucose-Capillary: 454 mg/dL — ABNORMAL HIGH (ref 65–99)

## 2017-06-09 LAB — I-STAT CG4 LACTIC ACID, ED: Lactic Acid, Venous: 2.22 mmol/L (ref 0.5–1.9)

## 2017-06-09 MED ORDER — PIPERACILLIN-TAZOBACTAM 3.375 G IVPB 30 MIN
3.3750 g | Freq: Once | INTRAVENOUS | Status: AC
  Administered 2017-06-09: 3.375 g via INTRAVENOUS
  Filled 2017-06-09: qty 50

## 2017-06-09 MED ORDER — SODIUM CHLORIDE 0.9 % IV BOLUS (SEPSIS)
1000.0000 mL | Freq: Once | INTRAVENOUS | Status: AC
  Administered 2017-06-09: 1000 mL via INTRAVENOUS

## 2017-06-09 MED ORDER — MORPHINE SULFATE (PF) 4 MG/ML IV SOLN
8.0000 mg | Freq: Once | INTRAVENOUS | Status: AC
  Administered 2017-06-09: 8 mg via INTRAVENOUS
  Filled 2017-06-09: qty 2

## 2017-06-09 MED ORDER — ACETAMINOPHEN 325 MG PO TABS
650.0000 mg | ORAL_TABLET | Freq: Once | ORAL | Status: AC
  Administered 2017-06-09: 650 mg via ORAL
  Filled 2017-06-09: qty 2

## 2017-06-09 NOTE — ED Notes (Signed)
Patient transported to X-ray 

## 2017-06-09 NOTE — ED Triage Notes (Signed)
Pt states she is a diabetic and has been fighting decubitus ulcers on her feet for some time. Pt reports that over the past week she has had fevers and pus draining from her wounds. Same are wrapped upon her arrival. Unwrapped same and they are weeping and black with decreased pulses and delayed cap refil.

## 2017-06-09 NOTE — ED Provider Notes (Signed)
[facility]-EMERGENCY DEPT Provider Note   CSN: [id number] Arrival date & time: 06/09/17  2056     History   Chief Complaint Chief Complaint  Patient presents with  . Wound Infection    Bilateral Feet  . Hyperglycemia    HPI Danielle Buchanan is a 61 y.o. female.  HPI  61 year old female with a history of diabetes resents with worsening foot pain in her left foot. She's been having skin breakdown and ulcers in both feet for the last several months. She has seen her endocrinologist at urgent care. She saw the wound care 3 days ago and she has appointment with vascular and vein on 9/10. However she was told by the wound care center to come into the ER immediately and intervention of her vascular disease if her pain worsened. The pain has been worsening over the last few days but severely worse tonight.  Has been on bactrim for the past week from urgent care.   Past Medical History:  Diagnosis Date  . Allergic rhinitis   . Depression   . Diabetes (HCC)   . Dyslipidemia   . HTN (hypertension)   . Insomnia     Patient Active Problem List   Diagnosis Date Noted  . Rash 05/24/2017  . Diabetes (HCC)   . HTN (hypertension)   . Allergic rhinitis   . Insomnia   . Depression     Past Surgical History:  Procedure Laterality Date  . CARPAL TUNNEL RELEASE Right     OB History    No data available       Home Medications    Prior to Admission medications   Medication Sig Start Date End Date Taking? Authorizing Provider  amitriptyline (ELAVIL) 25 MG tablet Take 25 mg by mouth 2 (two) times daily.   Yes [provider]  cetirizine (ZYRTEC) 10 MG tablet Take 10 mg by mouth daily.  01/30/16  Yes [provider]  gabapentin (NEURONTIN) 300 MG capsule Take 300 mg by mouth See admin instructions. Take 300mg  once on day 1, then 300mg  twice on day 2, then 300mg  three times daily thereafter 05/28/17  Yes [provider]  HYDROcodone-acetaminophen (NORCO/VICODIN) 5-325  MG tablet Take 1 tablet by mouth every 6 (six) hours as needed for pain. 06/07/17  Yes [provider]  losartan (COZAAR) 100 MG tablet Take 100 mg by mouth daily.   Yes [provider]  mupirocin cream (BACTROBAN) 2 % Apply 1 application topically daily. 05/30/17  Yes [provider]  pravastatin (PRAVACHOL) 20 MG tablet Take 20 mg by mouth daily.    Yes [provider]  sertraline (ZOLOFT) 100 MG tablet Take 100 mg by mouth daily.    Yes [provider]  sulfamethoxazole-trimethoprim (BACTRIM DS,SEPTRA DS) 800-160 MG tablet Take 1 tablet by mouth 2 (two) times daily. 05/28/17  Yes [provider]  traZODone (DESYREL) 50 MG tablet Take 50 mg by mouth at bedtime.   Yes [provider]  insulin NPH-regular Human (NOVOLIN 70/30) (70-30) 100 UNIT/ML injection 120 units with breakfast, and 90 units with the evening meal Patient taking differently: Inject 60-90 Units into the skin See admin instructions. 90 units with breakfast, and 120 units with the evening meal 05/24/17   [name], MD    Family History Family History  Problem Relation Age of Onset  . Heart attack Mother   . Alcohol abuse Mother   . Heart attack Father   . Alcohol abuse Father   .  Heart attack Sister   . Heart attack Brother   . Hypercholesterolemia Sister   . Diabetes Neg Hx     Social History Social History  Substance Use Topics  . Smoking status: Current Every Day Smoker    Packs/day: 1.00    Years: 51.00    Types: Cigarettes  . Smokeless tobacco: Never Used  . Alcohol use No     Allergies   Doxycycline   Review of Systems Review of Systems  Constitutional: Negative for fever.  Musculoskeletal: Positive for arthralgias.  Skin: Positive for color change and wound.  Neurological: Negative for weakness and numbness.  All other systems reviewed and are negative.    Physical Exam Updated Vital Signs BP (!) 141/67   Pulse 87   Temp (!)  100.8 F (38.2 C) (Rectal)   Resp (!) 21   Ht 5\' 4"  (1.626 m)   Wt 106.6 kg (235 lb)   SpO2 94%   BMI 40.34 kg/m   Physical Exam  Constitutional: She is oriented to person, place, and time. She appears well-developed and well-nourished.  HENT:  Head: Normocephalic and atraumatic.  Right Ear: External ear normal.  Left Ear: External ear normal.  Nose: Nose normal.  Eyes: Right eye exhibits no discharge. Left eye exhibits no discharge.  Cardiovascular: Normal rate, regular rhythm and normal heart sounds.   Pulses:      Dorsalis pedis pulses are Detected w/ doppler on the right side, and Detected w/ doppler on the left side.  Pulmonary/Chest: Effort normal.  Abdominal: She exhibits no distension.  Feet:  Right Foot:  Skin Integrity: Positive for skin breakdown.  Left Foot:  Skin Integrity: Positive for blister, skin breakdown and erythema.  Neurological: She is alert and oriented to person, place, and time.  Skin: Skin is warm and dry. She is not diaphoretic.  Nursing note and vitals reviewed.        ED Treatments / Results  Labs (all labs ordered are listed, but only abnormal results are displayed) Labs Reviewed  COMPREHENSIVE METABOLIC PANEL - Abnormal; Notable for the following:       Result Value   Sodium 125 (*)    Chloride 92 (*)    CO2 21 (*)    Glucose, Bld 452 (*)    Creatinine, Ser 1.19 (*)    GFR calc non Af Amer 48 (*)    GFR calc Af Amer 56 (*)    All other components within normal limits  CBC WITH DIFFERENTIAL/PLATELET - Abnormal; Notable for the following:    WBC 11.0 (*)    Neutro Abs 8.2 (*)    All other components within normal limits  CBG MONITORING, ED - Abnormal; Notable for the following:    Glucose-Capillary 454 (*)    All other components within normal limits  I-STAT CG4 LACTIC ACID, ED - Abnormal; Notable for the following:    Lactic Acid, Venous 2.22 (*)    All other components within normal limits  CULTURE, BLOOD (ROUTINE X 2)    CULTURE, BLOOD (ROUTINE X 2)  PROTIME-INR  URINALYSIS, ROUTINE W REFLEX MICROSCOPIC  CBG MONITORING, ED  I-STAT CG4 LACTIC ACID, ED    EKG  EKG Interpretation None       Radiology Dg Foot Complete Left  Result Date: 06/09/2017 CLINICAL DATA:  Bilateral foot infection. Diabetic foot ulcers for 2 months. EXAM: LEFT FOOT - COMPLETE 3+ VIEW COMPARISON:  None. FINDINGS: Probable ulcer about the distal aspect of the great toe.  No tracking soft tissue air. No radiopaque foreign body. No associated periosteal reaction or bony destructive change. No additional soft tissue ulcer is seen radiographically. Plantar calcaneal spur. No fracture or dislocation. IMPRESSION: Probable ulcer about the distal great toe. No radiographic evidence of osteomyelitis. Electronically Signed   By: Rubye OaksMelanie  Ehinger M.D.   On: 06/09/2017 23:00   Dg Foot Complete Right  Result Date: 06/09/2017 CLINICAL DATA:  Bilateral foot infection.  Diabetic foot ulcer. EXAM: RIGHT FOOT COMPLETE - 3+ VIEW COMPARISON:  None. FINDINGS: Suspect soft tissue ulcer about the fourth-fifth interspace with probable skin defect. No radiopaque foreign body. No radiographic evidence of bony destructive change or periosteal reaction. No additional findings of soft tissue ulcer. No fracture dislocation. Plantar calcaneal spur. Ossification within Kager's fat pad which appears chronic. IMPRESSION: Suspected soft tissue ulcer about the fourth- fifth interspace. No radiographic evidence of osteomyelitis. Electronically Signed   By: Rubye OaksMelanie  Ehinger M.D.   On: 06/09/2017 23:02    Procedures Procedures (including critical care time)  Medications Ordered in ED Medications  piperacillin-tazobactam (ZOSYN) IVPB 3.375 g (3.375 g Intravenous New Bag/Given 06/09/17 2333)  sodium chloride 0.9 % bolus 1,000 mL (1,000 mLs Intravenous New Bag/Given 06/09/17 2323)  morphine 4 MG/ML injection 8 mg (8 mg Intravenous Given 06/09/17 2323)  sodium chloride 0.9 % bolus  1,000 mL (1,000 mLs Intravenous New Bag/Given 06/09/17 2323)  acetaminophen (TYLENOL) tablet 650 mg (650 mg Oral Given 06/09/17 2332)     Initial Impression / Assessment and Plan / ED Course  I have reviewed the triage vital signs and the nursing notes.  Pertinent labs & imaging results that were available during my care of the patient were reviewed by me and considered in my medical decision making (see chart for details).     Patient has a low grade fever, 100.8 here. Given antibiotics for presumed cellulitis from poor vasculature and poorly controlled diabetes. D/w Dr. Randie Heinzain who will come eval. Consult hospitalist for admission given poor diabetic control in addition to infection. Fluids, zosyn. Added vanc after discussing with Dr. Selena BattenKim. Appears stable, not septic at this time. No obvious abscess.   Final Clinical Impressions(s) / ED Diagnoses   Final diagnoses:  Type 2 diabetes mellitus with diabetic foot infection (HCC)    New Prescriptions New Prescriptions   No medications on file     Pricilla LovelessGoldston, Oren Barella, MD 06/10/17 0005

## 2017-06-09 NOTE — ED Notes (Signed)
Called for triage no response 

## 2017-06-10 ENCOUNTER — Inpatient Hospital Stay (HOSPITAL_COMMUNITY): Payer: Medicare HMO

## 2017-06-10 ENCOUNTER — Encounter (HOSPITAL_COMMUNITY): Payer: Self-pay | Admitting: Internal Medicine

## 2017-06-10 DIAGNOSIS — I10 Essential (primary) hypertension: Secondary | ICD-10-CM | POA: Diagnosis not present

## 2017-06-10 DIAGNOSIS — Z888 Allergy status to other drugs, medicaments and biological substances status: Secondary | ICD-10-CM | POA: Diagnosis not present

## 2017-06-10 DIAGNOSIS — Z79899 Other long term (current) drug therapy: Secondary | ICD-10-CM | POA: Diagnosis not present

## 2017-06-10 DIAGNOSIS — E1142 Type 2 diabetes mellitus with diabetic polyneuropathy: Secondary | ICD-10-CM | POA: Diagnosis not present

## 2017-06-10 DIAGNOSIS — L97519 Non-pressure chronic ulcer of other part of right foot with unspecified severity: Secondary | ICD-10-CM | POA: Diagnosis present

## 2017-06-10 DIAGNOSIS — E1140 Type 2 diabetes mellitus with diabetic neuropathy, unspecified: Secondary | ICD-10-CM | POA: Diagnosis present

## 2017-06-10 DIAGNOSIS — R509 Fever, unspecified: Secondary | ICD-10-CM | POA: Diagnosis not present

## 2017-06-10 DIAGNOSIS — G4733 Obstructive sleep apnea (adult) (pediatric): Secondary | ICD-10-CM | POA: Diagnosis present

## 2017-06-10 DIAGNOSIS — E1165 Type 2 diabetes mellitus with hyperglycemia: Secondary | ICD-10-CM | POA: Diagnosis present

## 2017-06-10 DIAGNOSIS — E871 Hypo-osmolality and hyponatremia: Secondary | ICD-10-CM | POA: Diagnosis present

## 2017-06-10 DIAGNOSIS — F3289 Other specified depressive episodes: Secondary | ICD-10-CM | POA: Diagnosis present

## 2017-06-10 DIAGNOSIS — E11621 Type 2 diabetes mellitus with foot ulcer: Secondary | ICD-10-CM | POA: Diagnosis present

## 2017-06-10 DIAGNOSIS — L0390 Cellulitis, unspecified: Secondary | ICD-10-CM | POA: Diagnosis present

## 2017-06-10 DIAGNOSIS — Z811 Family history of alcohol abuse and dependence: Secondary | ICD-10-CM | POA: Diagnosis not present

## 2017-06-10 DIAGNOSIS — Z8249 Family history of ischemic heart disease and other diseases of the circulatory system: Secondary | ICD-10-CM | POA: Diagnosis not present

## 2017-06-10 DIAGNOSIS — Z794 Long term (current) use of insulin: Secondary | ICD-10-CM

## 2017-06-10 DIAGNOSIS — E11628 Type 2 diabetes mellitus with other skin complications: Secondary | ICD-10-CM | POA: Diagnosis present

## 2017-06-10 DIAGNOSIS — Z8349 Family history of other endocrine, nutritional and metabolic diseases: Secondary | ICD-10-CM | POA: Diagnosis not present

## 2017-06-10 DIAGNOSIS — L089 Local infection of the skin and subcutaneous tissue, unspecified: Secondary | ICD-10-CM | POA: Diagnosis not present

## 2017-06-10 DIAGNOSIS — I70219 Atherosclerosis of native arteries of extremities with intermittent claudication, unspecified extremity: Secondary | ICD-10-CM | POA: Diagnosis not present

## 2017-06-10 DIAGNOSIS — F17210 Nicotine dependence, cigarettes, uncomplicated: Secondary | ICD-10-CM | POA: Diagnosis present

## 2017-06-10 DIAGNOSIS — G4700 Insomnia, unspecified: Secondary | ICD-10-CM | POA: Diagnosis present

## 2017-06-10 DIAGNOSIS — E785 Hyperlipidemia, unspecified: Secondary | ICD-10-CM | POA: Diagnosis present

## 2017-06-10 HISTORY — DX: Hypo-osmolality and hyponatremia: E87.1

## 2017-06-10 LAB — CBC WITH DIFFERENTIAL/PLATELET
BASOS PCT: 0 %
Basophils Absolute: 0 10*3/uL (ref 0.0–0.1)
EOS PCT: 5 %
Eosinophils Absolute: 0.4 10*3/uL (ref 0.0–0.7)
HEMATOCRIT: 37.1 % (ref 36.0–46.0)
Hemoglobin: 12.2 g/dL (ref 12.0–15.0)
Lymphocytes Relative: 27 %
Lymphs Abs: 2.5 10*3/uL (ref 0.7–4.0)
MCH: 28.4 pg (ref 26.0–34.0)
MCHC: 32.9 g/dL (ref 30.0–36.0)
MCV: 86.5 fL (ref 78.0–100.0)
MONO ABS: 0.4 10*3/uL (ref 0.1–1.0)
MONOS PCT: 4 %
NEUTROS ABS: 6 10*3/uL (ref 1.7–7.7)
Neutrophils Relative %: 64 %
PLATELETS: 191 10*3/uL (ref 150–400)
RBC: 4.29 MIL/uL (ref 3.87–5.11)
RDW: 13.6 % (ref 11.5–15.5)
WBC: 9.4 10*3/uL (ref 4.0–10.5)

## 2017-06-10 LAB — COMPREHENSIVE METABOLIC PANEL
ALBUMIN: 3.2 g/dL — AB (ref 3.5–5.0)
ALT: 22 U/L (ref 14–54)
ANION GAP: 8 (ref 5–15)
AST: 20 U/L (ref 15–41)
Alkaline Phosphatase: 72 U/L (ref 38–126)
BILIRUBIN TOTAL: 0.6 mg/dL (ref 0.3–1.2)
BUN: 17 mg/dL (ref 6–20)
CHLORIDE: 102 mmol/L (ref 101–111)
CO2: 21 mmol/L — ABNORMAL LOW (ref 22–32)
Calcium: 8.2 mg/dL — ABNORMAL LOW (ref 8.9–10.3)
Creatinine, Ser: 1.02 mg/dL — ABNORMAL HIGH (ref 0.44–1.00)
GFR calc Af Amer: >60 mL/min (ref >60)
GFR, EST NON AFRICAN AMERICAN: 58 mL/min — AB (ref >60)
GLUCOSE: 337 mg/dL — AB (ref 65–99)
POTASSIUM: 3.5 mmol/L (ref 3.5–5.1)
Sodium: 131 mmol/L — ABNORMAL LOW (ref 135–145)
TOTAL PROTEIN: 6 g/dL — AB (ref 6.5–8.1)

## 2017-06-10 LAB — GLUCOSE, CAPILLARY
GLUCOSE-CAPILLARY: 327 mg/dL — AB (ref 65–99)
GLUCOSE-CAPILLARY: 196 mg/dL — AB (ref 65–99)
GLUCOSE-CAPILLARY: 260 mg/dL — AB (ref 65–99)
Glucose-Capillary: 293 mg/dL — ABNORMAL HIGH (ref 65–99)
Glucose-Capillary: 132 mg/dL — ABNORMAL HIGH (ref 65–99)
Glucose-Capillary: 127 mg/dL — ABNORMAL HIGH (ref 65–99)

## 2017-06-10 LAB — URINALYSIS, ROUTINE W REFLEX MICROSCOPIC
BILIRUBIN URINE: NEGATIVE
Bacteria, UA: NONE SEEN
Glucose, UA: >=500 mg/dL — AB
Hgb urine dipstick: NEGATIVE
KETONES UR: NEGATIVE mg/dL
LEUKOCYTES UA: NEGATIVE
NITRITE: NEGATIVE
PROTEIN: NEGATIVE mg/dL
Specific Gravity, Urine: 1.018 (ref 1.005–1.030)
pH: 5 (ref 5.0–8.0)

## 2017-06-10 LAB — HEMOGLOBIN A1C
HEMOGLOBIN A1C: 11.6 % — AB (ref 4.8–5.6)
MEAN PLASMA GLUCOSE: 286.22 mg/dL

## 2017-06-10 MED ORDER — LOSARTAN POTASSIUM 50 MG PO TABS
100.0000 mg | ORAL_TABLET | Freq: Every day | ORAL | Status: DC
  Administered 2017-06-10 – 2017-06-11 (×2): 100 mg via ORAL
  Filled 2017-06-10 (×2): qty 2

## 2017-06-10 MED ORDER — ENOXAPARIN SODIUM 40 MG/0.4ML ~~LOC~~ SOLN
40.0000 mg | SUBCUTANEOUS | Status: DC
  Administered 2017-06-10 – 2017-06-11 (×2): 40 mg via SUBCUTANEOUS
  Filled 2017-06-10 (×2): qty 0.4

## 2017-06-10 MED ORDER — MORPHINE SULFATE (PF) 2 MG/ML IV SOLN: 1.0000 mg | INTRAVENOUS | Status: DC | PRN

## 2017-06-10 MED ORDER — VANCOMYCIN HCL IN DEXTROSE 750-5 MG/150ML-% IV SOLN
750.0000 mg | Freq: Two times a day (BID) | INTRAVENOUS | Status: DC
  Administered 2017-06-10 – 2017-06-11 (×3): 750 mg via INTRAVENOUS
  Filled 2017-06-10 (×4): qty 150

## 2017-06-10 MED ORDER — SODIUM CHLORIDE 0.9 % IV SOLN
INTRAVENOUS | Status: AC
  Administered 2017-06-10 (×2): via INTRAVENOUS

## 2017-06-10 MED ORDER — ACETAMINOPHEN 325 MG PO TABS: 650.0000 mg | ORAL_TABLET | Freq: Four times a day (QID) | ORAL | Status: DC | PRN

## 2017-06-10 MED ORDER — INSULIN ASPART PROT & ASPART (70-30 MIX) 100 UNIT/ML ~~LOC~~ SUSP
90.0000 [IU] | Freq: Two times a day (BID) | SUBCUTANEOUS | Status: DC
  Administered 2017-06-10 – 2017-06-11 (×4): 90 [IU] via SUBCUTANEOUS
  Filled 2017-06-10: qty 10

## 2017-06-10 MED ORDER — HYDROCODONE-ACETAMINOPHEN 5-325 MG PO TABS
1.0000 | ORAL_TABLET | Freq: Four times a day (QID) | ORAL | Status: DC | PRN
  Administered 2017-06-10 (×2): 1 via ORAL
  Filled 2017-06-10 (×2): qty 1

## 2017-06-10 MED ORDER — ONDANSETRON HCL 4 MG/2ML IJ SOLN: 4.0000 mg | Freq: Four times a day (QID) | INTRAMUSCULAR | Status: DC | PRN

## 2017-06-10 MED ORDER — PRAVASTATIN SODIUM 20 MG PO TABS
20.0000 mg | ORAL_TABLET | Freq: Every day | ORAL | Status: DC
  Administered 2017-06-10 – 2017-06-11 (×2): 20 mg via ORAL
  Filled 2017-06-10 (×2): qty 1

## 2017-06-10 MED ORDER — TRAZODONE HCL 50 MG PO TABS
50.0000 mg | ORAL_TABLET | Freq: Every day | ORAL | Status: DC
  Administered 2017-06-10 (×2): 50 mg via ORAL
  Filled 2017-06-10 (×2): qty 1

## 2017-06-10 MED ORDER — AMITRIPTYLINE HCL 25 MG PO TABS
25.0000 mg | ORAL_TABLET | Freq: Two times a day (BID) | ORAL | Status: DC
  Administered 2017-06-10 – 2017-06-11 (×4): 25 mg via ORAL
  Filled 2017-06-10 (×4): qty 1

## 2017-06-10 MED ORDER — VANCOMYCIN HCL IN DEXTROSE 1-5 GM/200ML-% IV SOLN
1000.0000 mg | Freq: Once | INTRAVENOUS | Status: AC
  Administered 2017-06-10: 1000 mg via INTRAVENOUS
  Filled 2017-06-10: qty 200

## 2017-06-10 MED ORDER — LORATADINE 10 MG PO TABS
10.0000 mg | ORAL_TABLET | Freq: Every day | ORAL | Status: DC
  Administered 2017-06-10 – 2017-06-11 (×2): 10 mg via ORAL
  Filled 2017-06-10 (×2): qty 1

## 2017-06-10 MED ORDER — INSULIN ASPART 100 UNIT/ML ~~LOC~~ SOLN
0.0000 [IU] | Freq: Every day | SUBCUTANEOUS | Status: DC
  Administered 2017-06-10: 4 [IU] via SUBCUTANEOUS

## 2017-06-10 MED ORDER — PIPERACILLIN-TAZOBACTAM 3.375 G IVPB
3.3750 g | Freq: Three times a day (TID) | INTRAVENOUS | Status: DC
  Administered 2017-06-10 – 2017-06-11 (×5): 3.375 g via INTRAVENOUS
  Filled 2017-06-10 (×7): qty 50

## 2017-06-10 MED ORDER — MORPHINE SULFATE (PF) 2 MG/ML IV SOLN
1.0000 mg | INTRAVENOUS | Status: DC | PRN
  Administered 2017-06-10: 2 mg via INTRAVENOUS
  Filled 2017-06-10: qty 1

## 2017-06-10 MED ORDER — HYDROCODONE-ACETAMINOPHEN 5-325 MG PO TABS
1.0000 | ORAL_TABLET | ORAL | Status: DC | PRN
  Administered 2017-06-10: 2 via ORAL
  Filled 2017-06-10: qty 2

## 2017-06-10 MED ORDER — SERTRALINE HCL 100 MG PO TABS
100.0000 mg | ORAL_TABLET | Freq: Every day | ORAL | Status: DC
  Administered 2017-06-10 – 2017-06-11 (×2): 100 mg via ORAL
  Filled 2017-06-10 (×2): qty 1

## 2017-06-10 MED ORDER — ACETAMINOPHEN 650 MG RE SUPP: 650.0000 mg | Freq: Four times a day (QID) | RECTAL | Status: DC | PRN

## 2017-06-10 MED ORDER — INSULIN ASPART 100 UNIT/ML ~~LOC~~ SOLN
0.0000 [IU] | Freq: Three times a day (TID) | SUBCUTANEOUS | Status: DC
  Administered 2017-06-10: 8 [IU] via SUBCUTANEOUS
  Administered 2017-06-10: 2 [IU] via SUBCUTANEOUS
  Administered 2017-06-10: 3 [IU] via SUBCUTANEOUS
  Administered 2017-06-11 (×2): 2 [IU] via SUBCUTANEOUS

## 2017-06-10 MED ORDER — NICOTINE 21 MG/24HR TD PT24
21.0000 mg | MEDICATED_PATCH | Freq: Every day | TRANSDERMAL | Status: DC
  Administered 2017-06-10 – 2017-06-11 (×2): 21 mg via TRANSDERMAL
  Filled 2017-06-10 (×2): qty 1

## 2017-06-10 NOTE — Consult Note (Signed)
WOC Nurse wound consult note Reason for Consult: Bilateral full thickness wounds on feet, L>R.  Poorly controlled diabetes, current smoking.  [facility] (Dr. Randie Heinzain) saw early this am in ED. ABIs performed in office in early August. [facility] came to Ed due to worsening pain in the LLE. Dr. Randie Heinzain indicates that a baseline angiogram may be forthcoming and notes that due to likelihood of microvascular disease, she may require an amputation of toes or midfoot. Wound type: Arterial insufficiency Pressure Injury POA: N/A Measurement:  Full thickness ulcerations on the right foot, base of 4th and 5th digit measuring 1cm round x 0.4cm with dry, pale pink wound bed.  The 4th digit is erythematous and discolored maroon/purple. Full thickness wound on LGT with hard, dry eschar measuring 2cm round.  Ischemic purple discolorations surround left great toe. Ischemic changes on left forefoot encompassing digits 3-5 and extending 9cm in length. 4th and 5th toes with purple/maroon discolorations.  Full thickness wound at base of 4th digit. Wound bed:As described above Drainage (amount, consistency, odor) None Periwound:As described above Dressing procedure/placement/frequency: I will implement a conservative POC that includes application of a betadine swabstick to wounds and allowing them to air-dry before applying dry dressings and placing feet into protective boots that provide pressure redistribution, correction of lateral rotation and prevention of injury. I will defer all other limb and digit care to [facility] at this time. WOC nursing team will not follow, but will remain available to this patient, the nursing and medical teams.  Please re-consult if needed. Thanks, Ladona MowLaurie Kiarah Eckstein, MSN, RN, GNP, Hans EdenCWOCN, CWON-AP, FAAN  Pager# [phone]

## 2017-06-10 NOTE — H&P (Signed)
TRH H&P   Patient Demographics:    Danielle Buchanan, is a 61 y.o. female  MRN: [id number]   DOB - 04-Aug-1956  Admit Date - 06/09/2017  Outpatient Primary MD for the patient is Shirlean MylarWebb, Carol, MD  Referring MD/NP/PA:   Pricilla LovelessScott Goldston  Outpatient Specialists:  Romero BellingSean Ellison (endocrinology)   Patient coming from:   Chief Complaint  Patient presents with  . Wound Infection    Bilateral Feet  . Hyperglycemia      HPI:    Danielle Danielle Buchanan  is a 61 y.o. female, w uncontrolled dm2, hypertension, nicotine dep, apparently c/o pain in the left foot and that is why she presented today.  Pt has had foot sores for the past couple of months according to her son.   Pt has been taking bactrim at home, has been on for about 1.5 weeks. From urgent care [facility]  In ED,  Pt was noted to be febrile.  Xrays negative for osteomyelitis.  Bun 19, creatinine 1.19  Glucose 452.  Wbc 11.0.  T 100.8,  Vascular consulted in the ED and didn't think that her circulation above her ankles was poor.  Awaiting final consult report.  Pt will be admitted for cellulitis and fever.      Review of systems:    In addition to the HPI above,   No Headache, No changes with Vision or hearing, No problems swallowing food or Liquids, No Chest pain, Cough or Shortness of Breath, No Abdominal pain, No Nausea or Vommitting, Bowel movements are regular, No Blood in stool or Urine, No dysuria,  No new joints pains-aches,  No new weakness, tingling, numbness in any extremity, No recent weight gain or loss, No polyuria, polydypsia or polyphagia, No significant Mental Stressors.  A full 10 point Review of Systems was done, except as stated above, all other Review of Systems were negative.   With Past History of the following :    Past Medical History:  Diagnosis Date  . Allergic rhinitis   . Depression   . Diabetes  (HCC)   . Dyslipidemia   . HTN (hypertension)   . Insomnia   . OSA (obstructive sleep apnea)       Past Surgical History:  Procedure Laterality Date  . CARPAL TUNNEL RELEASE Right       Social History:     Social History  Substance Use Topics  . Smoking status: Current Every Day Smoker    Packs/day: 1.00    Years: 51.00    Types: Cigarettes  . Smokeless tobacco: Never Used  . Alcohol use No     Lives - at home  Mobility - walks by self   Family History :     Family History  Problem Relation Age of Onset  . Heart attack Mother   . Alcohol abuse Mother   . Heart attack Father   .  Alcohol abuse Father   . Heart attack Sister   . Heart attack Brother   . Hypercholesterolemia Sister   . Diabetes Neg Hx       Home Medications:   Prior to Admission medications   Medication Sig Start Date End Date Taking? Authorizing Provider  amitriptyline (ELAVIL) 25 MG tablet Take 25 mg by mouth 2 (two) times daily.   Yes [provider]  cetirizine (ZYRTEC) 10 MG tablet Take 10 mg by mouth daily.  01/30/16  Yes [provider]  gabapentin (NEURONTIN) 300 MG capsule Take 300 mg by mouth See admin instructions. Take 300mg  once on day 1, then 300mg  twice on day 2, then 300mg  three times daily thereafter 05/28/17  Yes [provider]  HYDROcodone-acetaminophen (NORCO/VICODIN) 5-325 MG tablet Take 1 tablet by mouth every 6 (six) hours as needed for pain. 06/07/17  Yes [provider]  losartan (COZAAR) 100 MG tablet Take 100 mg by mouth daily.   Yes [provider]  mupirocin cream (BACTROBAN) 2 % Apply 1 application topically daily. 05/30/17  Yes [provider]  pravastatin (PRAVACHOL) 20 MG tablet Take 20 mg by mouth daily.    Yes [provider]  sertraline (ZOLOFT) 100 MG tablet Take 100 mg by mouth daily.    Yes [provider]  sulfamethoxazole-trimethoprim (BACTRIM DS,SEPTRA DS) 800-160 MG tablet Take 1 tablet  by mouth 2 (two) times daily. 05/28/17  Yes [provider]  traZODone (DESYREL) 50 MG tablet Take 50 mg by mouth at bedtime.   Yes [provider]  insulin NPH-regular Human (NOVOLIN 70/30) (70-30) 100 UNIT/ML injection 120 units with breakfast, and 90 units with the evening meal Patient taking differently: Inject 60-90 Units into the skin See admin instructions. 90 units with breakfast, and 120 units with the evening meal 05/24/17   [name], MD     Allergies:     Allergies  Allergen Reactions  . Doxycycline Rash     Physical Exam:   Vitals  Blood pressure 135/76, pulse 85, temperature (!) 100.8 F (38.2 C), temperature source Rectal, resp. rate 20, height 5\' 4"  (1.626 m), weight 106.6 kg (235 lb), SpO2 94 %.   1. General  lying in bed in NAD,   2. Normal affect and insight, Not Suicidal or Homicidal, Awake Alert, Oriented X 3.  3. No F.N deficits, ALL C.Nerves Intact, Strength 5/5 all 4 extremities, Sensation intact all 4 extremities, Plantars down going.  4. Ears and Eyes appear Normal, Conjunctivae clear, PERRLA. Moist Oral Mucosa.  5. Supple Neck, No JVD, No cervical lymphadenopathy appriciated, No Carotid Bruits.  6. Symmetrical Chest wall movement, Good air movement bilaterally, CTAB.  7. RRR, No Gallops, Rubs or Murmurs, No Parasternal Heave.  8. Positive Bowel Sounds, Abdomen Soft, No tenderness, No organomegaly appriciated,No rebound -guarding or rigidity.  9.  No Cyanosis, Normal Skin Turgor,  Black area on the tip of the 1st toe left foot about quarter sized Redness extending on the dorsum of the left foot about 1/3 towards ankle Ulcer on the right foot between the 4th and 5th mtp joint  10. Good muscle tone,  joints appear normal , no effusions, Normal ROM.  11. No Palpable Lymph Nodes in Neck or Axillae     Data Review:    CBC  Recent Labs Lab 06/09/17 2105  WBC 11.0*  HGB 13.4  HCT 39.7  PLT 205  MCV 86.5  MCH 29.2    MCHC 33.8  RDW 13.4  LYMPHSABS 1.8  MONOABS 0.7  EOSABS 0.4  BASOSABS 0.0   ------------------------------------------------------------------------------------------------------------------  Chemistries   Recent Labs Lab 06/09/17 2105  NA 125*  K 4.1  CL 92*  CO2 21*  GLUCOSE 452*  BUN 19  CREATININE 1.19*  CALCIUM 8.9  AST 21  ALT 25  ALKPHOS 86  BILITOT 0.5   ------------------------------------------------------------------------------------------------------------------ estimated creatinine clearance is 59.2 mL/min (A) (by C-G formula based on SCr of 1.19 mg/dL (H)). ------------------------------------------------------------------------------------------------------------------ No results for input(s): TSH, T4TOTAL, T3FREE, THYROIDAB in the last 72 hours.  Invalid input(s): FREET3  Coagulation profile  Recent Labs Lab 06/09/17 2105  INR 0.92   ------------------------------------------------------------------------------------------------------------------- No results for input(s): DDIMER in the last 72 hours. -------------------------------------------------------------------------------------------------------------------  Cardiac Enzymes No results for input(s): CKMB, TROPONINI, MYOGLOBIN in the last 168 hours.  Invalid input(s): CK ------------------------------------------------------------------------------------------------------------------ No results found for: BNP   ---------------------------------------------------------------------------------------------------------------  Urinalysis No results found for: COLORURINE, APPEARANCEUR, LABSPEC, PHURINE, GLUCOSEU, HGBUR, BILIRUBINUR, KETONESUR, PROTEINUR, UROBILINOGEN, NITRITE, LEUKOCYTESUR  ----------------------------------------------------------------------------------------------------------------   Imaging Results:    Dg Foot Complete Left  Result Date: 06/09/2017 CLINICAL DATA:   Bilateral foot infection. Diabetic foot ulcers for 2 months. EXAM: LEFT FOOT - COMPLETE 3+ VIEW COMPARISON:  None. FINDINGS: Probable ulcer about the distal aspect of the great toe. No tracking soft tissue air. No radiopaque foreign body. No associated periosteal reaction or bony destructive change. No additional soft tissue ulcer is seen radiographically. Plantar calcaneal spur. No fracture or dislocation. IMPRESSION: Probable ulcer about the distal great toe. No radiographic evidence of osteomyelitis. Electronically Signed   By: Rubye Oaks M.D.   On: 06/09/2017 23:00   Dg Foot Complete Right  Result Date: 06/09/2017 CLINICAL DATA:  Bilateral foot infection.  Diabetic foot ulcer. EXAM: RIGHT FOOT COMPLETE - 3+ VIEW COMPARISON:  None. FINDINGS: Suspect soft tissue ulcer about the fourth-fifth interspace with probable skin defect. No radiopaque foreign body. No radiographic evidence of bony destructive change or periosteal reaction. No additional findings of soft tissue ulcer. No fracture dislocation. Plantar calcaneal spur. Ossification within Kager's fat pad which appears chronic. IMPRESSION: Suspected soft tissue ulcer about the fourth- fifth interspace. No radiographic evidence of osteomyelitis. Electronically Signed   By: Rubye Oaks M.D.   On: 06/09/2017 23:02     Assessment & Plan:    Principal Problem:   Cellulitis Active Problems:   Diabetes (HCC)   HTN (hypertension)   Hyponatremia   Fever    Fever secondary to cellulitis Blood culture x2,  Awaiting ua, CXR tx with vanco in pharmacy to dose and zosyn  ? PVD Appreciate vascular consult  Hyponatremia Hydrate with ns iv  Diabetes uncontrolled fsbs ac and qhs, iss  Diabetic neuropathy Cont gabapentin  Hyperlipidemia Cont pravastatin  OSA probably, needs evaluation as outpatient  Nicotine dep Pt given a stern warning about smoking cessation Needs to quit immediately.  Nicotine patch    DVT Prophylaxis  Lovenox - SCDs   AM Labs Ordered, also please review Full Orders  Family Communication: Admission, patients condition and plan of care including tests being ordered have been discussed with the patient  who indicate understanding and agree with the plan and Code Status.  Code Status FULL CODE  Likely DC to  home  Condition GUARDED   Consults called: vascular  Admission status: inpatient  Time spent in minutes : 45 minutes   Pearson Grippe M.D on 06/10/2017 at 12:47 AM  Between 7am to 7pm - Pager - [phone]. After 7pm  go to [email or web address] - password [email or web address]  [facility] - Office  [phone]    

## 2017-06-10 NOTE — Progress Notes (Addendum)
  Progress Note  SUBJECTIVE:    No current complaints.   OBJECTIVE:   Vitals:   06/10/17 0436 06/10/17 0742  BP: 132/65 123/61  Pulse: 72 67  Resp: 18 20  Temp: 97.6 F (36.4 C) 97.7 F (36.5 C)  SpO2: 96% 95%    Intake/Output Summary (Last 24 hours) at 06/10/17 1245 Last data filed at 06/10/17 0915  Gross per 24 hour  Intake           978.33 ml  Output                0 ml  Net           978.33 ml   Right foot wound near 4th and 5th toes on dorsum  Left foot wound to dorsum with erythema and dry gangrene of left great toe. Left foot appears worse than right.  Palpable DP pulses bilaterally.   ASSESSMENT/PLAN:   61 y.o. female with bilateral foot wounds   Patient with palpable pedal pulses and normal ABIs and waveforms. However toe pressures are less than 30 mmHg bilaterally and her wounds are not likely to heal due to likely microvascular disease from diabetes. Will plan to evaluate with an arteriogram at some point to evaluate possible pedal intervention.  Raymond GurneyKimberly A Trinh 06/10/2017 12:45 PM -- LABS:   CBC    Component Value Date/Time   WBC 9.4 06/10/2017 0223   HGB 12.2 06/10/2017 0223   HCT 37.1 06/10/2017 0223   PLT 191 06/10/2017 0223    BMET    Component Value Date/Time   NA 131 (L) 06/10/2017 0223   K 3.5 06/10/2017 0223   CL 102 06/10/2017 0223   CO2 21 (L) 06/10/2017 0223   GLUCOSE 337 (H) 06/10/2017 0223   BUN 17 06/10/2017 0223   CREATININE 1.02 (H) 06/10/2017 0223   CALCIUM 8.2 (L) 06/10/2017 0223   GFRNONAA 58 (L) 06/10/2017 0223   GFRAA >60 06/10/2017 0223    COAG Lab Results  Component Value Date   INR 0.92 06/09/2017   No results found for: PTT  ANTIBIOTICS:   Anti-infectives    Start     Dose/Rate Route Frequency Ordered Stop   06/10/17 1000  vancomycin (VANCOCIN) IVPB 750 mg/150 ml premix     750 mg 150 mL/hr over 60 Minutes Intravenous Every 12 hours 06/10/17 0010     06/10/17 0600  piperacillin-tazobactam (ZOSYN) IVPB  3.375 g     3.375 g 12.5 mL/hr over 240 Minutes Intravenous Every 8 hours 06/10/17 0206     06/10/17 0015  vancomycin (VANCOCIN) IVPB 1000 mg/200 mL premix     1,000 mg 200 mL/hr over 60 Minutes Intravenous  Once 06/10/17 0010 06/10/17 0315   06/09/17 2330  piperacillin-tazobactam (ZOSYN) IVPB 3.375 g     3.375 g 100 mL/hr over 30 Minutes Intravenous  Once 06/09/17 2324 06/10/17 0009       Maris BergerKimberly Trinh, PA-C [facility] Office: [phone] Pager: [phone] 06/10/2017 12:45 PM     I have interviewed and examined patient with PA and agree with assessment and plan above.   Louanne Calvillo C. Randie Heinzain, MD [facility] [place] Office: [phone] Pager: [phone]

## 2017-06-10 NOTE — Consult Note (Signed)
Hospital Consult    Reason for Consult:  Bilateral foot wounds Requesting Physician:  ED MRN #:  [id number]  History of Present Illness: This is a 61 y.o. female with poorly controlled diabetes, hypertension and current smoking. She has been evaluated by her primary care doctor for bilateral foot wounds and has been referred to the [facility] and is supposed to see us next week. She did have ABIs performed in our office at the beginning of August. She states that she presents tonight with worsening left foot pain. She is followed by wound care and was told that should she have further issues she should present to the emergency department. She has not had fevers at home but is noted to have of low-grade temperature in the emergency department. States that her blood sugar has been worse control recently. She does not have any nausea or vomiting increased fatigue. She has never had vascular surgery in the past or known coronary disease. X-rays performed of her feet and ED tonight.  Past Medical History:  Diagnosis Date  . Allergic rhinitis   . Depression   . Diabetes (HCC)   . Dyslipidemia   . HTN (hypertension)   . Insomnia     Past Surgical History:  Procedure Laterality Date  . CARPAL TUNNEL RELEASE Right     Allergies  Allergen Reactions  . Doxycycline Rash    Prior to Admission medications   Medication Sig Start Date End Date Taking? Authorizing Provider  amitriptyline (ELAVIL) 25 MG tablet Take 25 mg by mouth 2 (two) times daily.   Yes [provider]  cetirizine (ZYRTEC) 10 MG tablet Take 10 mg by mouth daily.  01/30/16  Yes [provider]  gabapentin (NEURONTIN) 300 MG capsule Take 300 mg by mouth See admin instructions. Take 300mg  once on day 1, then 300mg  twice on day 2, then 300mg  three times daily thereafter 05/28/17  Yes [provider]  HYDROcodone-acetaminophen (NORCO/VICODIN) 5-325 MG tablet Take 1 tablet by mouth every 6 (six) hours as needed for  pain. 06/07/17  Yes [provider]  losartan (COZAAR) 100 MG tablet Take 100 mg by mouth daily.   Yes [provider]  mupirocin cream (BACTROBAN) 2 % Apply 1 application topically daily. 05/30/17  Yes [provider]  pravastatin (PRAVACHOL) 20 MG tablet Take 20 mg by mouth daily.    Yes [provider]  sertraline (ZOLOFT) 100 MG tablet Take 100 mg by mouth daily.    Yes [provider]  sulfamethoxazole-trimethoprim (BACTRIM DS,SEPTRA DS) 800-160 MG tablet Take 1 tablet by mouth 2 (two) times daily. 05/28/17  Yes [provider]  traZODone (DESYREL) 50 MG tablet Take 50 mg by mouth at bedtime.   Yes [provider]  insulin NPH-regular Human (NOVOLIN 70/30) (70-30) 100 UNIT/ML injection 120 units with breakfast, and 90 units with the evening meal Patient taking differently: Inject 60-90 Units into the skin See admin instructions. 90 units with breakfast, and 120 units with the evening meal 05/24/17   Romero BellingEllison, Sean, MD    Social History   Social History  . Marital status: Single    Spouse name: N/A  . Number of children: N/A  . Years of education: N/A   Occupational History  . Not on file.   Social History Main Topics  . Smoking status: Current Every Day Smoker    Packs/day: 1.00    Years: 51.00    Types: Cigarettes  . Smokeless tobacco: Never Used  .  Alcohol use No  . Drug use: No  . Sexual activity: Not on file   Other Topics Concern  . Not on file   Social History Narrative   Lives with sister in an apartment on the first floor.  Has no children.    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    Education: 3rd grade.     Family History  Problem Relation Age of Onset  . Heart attack Mother   . Alcohol abuse Mother   . Heart attack Father   . Alcohol abuse Father   . Heart attack Sister   . Heart attack Brother   . Hypercholesterolemia Sister   . Diabetes Neg Hx      REVIEW OF SYSTEMS (negative unless checked):   Cardiac:  []  Chest pain or chest pressure? []  Shortness of breath upon activity? []  Shortness of breath when lying flat? []  Irregular heart rhythm?  Vascular:  []  Pain in calf, thigh, or hip brought on by walking? []  Pain in feet at night that wakes you up from your sleep? []  Blood clot in your veins? []  Leg swelling?  Pulmonary:  []  Oxygen at home? []  Productive cough? []  Wheezing?  Neurologic:  []  Sudden weakness in arms or legs? []  Sudden numbness in arms or legs? []  Sudden onset of difficult speaking or slurred speech? []  Temporary loss of vision in one eye? []  Problems with dizziness?  Gastrointestinal:  []  Blood in stool? []  Vomited blood?  Genitourinary:  []  Burning when urinating? []  Blood in urine?  Psychiatric:  []  Major depression  Hematologic:  []  Bleeding problems? []  Problems with blood clotting?  Dermatologic:  [x]  Rashes or ulcers?  Constitutional:  [x]  Fever or chills?  Ear/Nose/Throat:  []  Change in hearing? []  Nose bleeds? []  Sore throat?  Musculoskeletal:  []  Back pain? []  Joint pain? []  Muscle pain?   Physical Examination  Vitals:   06/09/17 2345 06/10/17 0000  BP: 138/78 135/76  Pulse: 86 85  Resp: 19 20  Temp:    SpO2: 94% 94%   Body mass index is 40.34 kg/m.  General:  WDWN in NAD HENT: WNL, normocephalic Pulmonary: normal non-labored breathing Cardiac: rrr 2+ bilateral femoral pulses Palpable right DP, left PT Abdomen:  soft, NT/ND, no masses Extremities: wound near right 4th/5th interspace without erythema Left great toe has tip dry gangrene, erythema of much of forefoot Musculoskeletal: no muscle wasting or atrophy  Neurologic: A&O X 3; Appropriate Affect ; SENSATION: normal; MOTOR FUNCTION:  moving all extremities equally. Speech is fluent/normal Psychiatric:  Appropriate mood and affect  CBC    Component Value Date/Time   WBC 11.0 (H) 06/09/2017 2105    RBC 4.59 06/09/2017 2105   HGB 13.4 06/09/2017 2105   HCT 39.7 06/09/2017 2105   PLT 205 06/09/2017 2105   MCV 86.5 06/09/2017 2105   MCH 29.2 06/09/2017 2105   MCHC 33.8 06/09/2017 2105   RDW 13.4 06/09/2017 2105   LYMPHSABS 1.8 06/09/2017 2105   MONOABS 0.7 06/09/2017 2105   EOSABS 0.4 06/09/2017 2105   BASOSABS 0.0 06/09/2017 2105    BMET    Component Value Date/Time   NA 125 (L) 06/09/2017 2105   K 4.1 06/09/2017 2105   CL 92 (L) 06/09/2017 2105   CO2 21 (L) 06/09/2017 2105   GLUCOSE 452 (H) 06/09/2017 2105   BUN 19 06/09/2017 2105   CREATININE 1.19 (H) 06/09/2017 2105   CALCIUM 8.9 06/09/2017  2105   GFRNONAA 48 (L) 06/09/2017 2105   GFRAA 56 (L) 06/09/2017 2105    COAGS: Lab Results  Component Value Date   INR 0.92 06/09/2017     Non-Invasive Vascular Imaging:      ASSESSMENT/PLAN: This is a 61 y.o. female with diabetes hypertension and current smoker. She presents today with worsening pain specifically in her left foot she has wounds on both and is followed in the wound center. She does have a low-grade temperature and also mild leukocytosis for which antibiotics have been started. She'll be admitted and will need blood sugar control. She does have palpable ankle pulses and I discussed with her that much of her disease is likely distal on the foot specifically on the right where I can obtain a signal near the toes. She may benefit from angiogram as a baseline study and see if there is any pedal work to be done to help improve the blood flow. Unfortunately much of this is likely microvascular disease secondary to diabetes and I discussed with her that she might lose her toes or have more proximal amputation of her blood sugar is not better controlled and she continues to smoke. She demonstrates good understanding of the presence of her nephew.    Britiney Blahnik C. Randie Heinz, MD [facility] [place] Office: [phone] Pager: [phone]

## 2017-06-10 NOTE — Progress Notes (Signed)
Danielle Buchanan  is a 61 y.o. female, w uncontrolled dm2, hypertension, nicotine dependent, comes in for worsening left leg pain. She was found to have worsening bilateral foot wounds. She was admitted to medical service and vascular surgery consulted for recommendations. Plan for arteriogram later this week  As per vascular.   Plan:  1. Arteriogram next week.  2. Pain control.    Edson SnowballVijaya Kiyan Burmester,MD [phone]

## 2017-06-10 NOTE — Progress Notes (Signed)
Report received. Room ready.  

## 2017-06-10 NOTE — Progress Notes (Addendum)
Pharmacy Antibiotic Note  Danielle Buchanan is a 61 y.o. female admitted on 06/09/2017 with wound infection, bilateral lower extremities.  Pharmacy has been consulted for Vancomycin/Zosyn dosing. WBC 11.0 Renal function ok.   Plan: Vancomycin 750 mg IV q12h  Zosyn 3.375G IV q8h to be infused over 4 hours Trend WBC, temp, renal function  F/U infectious work-up Drug levels as indicated   Height: 5\' 4"  (162.6 cm) Weight: 235 lb (106.6 kg) IBW/kg (Calculated) : 54.7  Temp (24hrs), Avg:100 F (37.8 C), Min:99.1 F (37.3 C), Max:100.8 F (38.2 C)   Recent Labs Lab 06/09/17 2105 06/09/17 2137  WBC 11.0*  --   CREATININE 1.19*  --   LATICACIDVEN  --  2.22*    Estimated Creatinine Clearance: 59.2 mL/min (A) (by C-G formula based on SCr of 1.19 mg/dL (H)).    Allergies  Allergen Reactions  . Doxycycline Rash    Abran DukeLedford, Sylvana Bonk 06/10/2017 12:08 AM

## 2017-06-10 NOTE — Progress Notes (Signed)
New Admission Note:  Arrival Method: Stretcher  Mental Orientation: Alert and oriented x4 Telemetry: N/A Assessment: Completed Skin Warm and dry. Diabetic foot ulcer Bil.  IV: NSL  Pain: 5/10  Tubes: N/A Safety Measures: Safety Fall Prevention Plan was given, discussed and signed. Admission: Completed [facility]  Orientation: Patient has been orientated to the room, unit and the staff. Family: Nephew  Orders have been reviewed and implemented. Will continue to monitor the patient. Call light has been placed within reach and bed alarm has been activated.   Guilford ShiEmmanuel Enaya Howze BSN, RN  Phone Number: [phone]

## 2017-06-10 NOTE — Progress Notes (Signed)
Dressing changed per order and prevalon boots applied. Will continue to monitor.

## 2017-06-10 NOTE — Progress Notes (Signed)
Dressing changed per order. No complications. Will continue to monitor.  

## 2017-06-11 ENCOUNTER — Ambulatory Visit: Payer: Medicare HMO | Admitting: Internal Medicine

## 2017-06-11 DIAGNOSIS — E11628 Type 2 diabetes mellitus with other skin complications: Principal | ICD-10-CM

## 2017-06-11 DIAGNOSIS — I10 Essential (primary) hypertension: Secondary | ICD-10-CM

## 2017-06-11 DIAGNOSIS — L089 Local infection of the skin and subcutaneous tissue, unspecified: Secondary | ICD-10-CM

## 2017-06-11 LAB — BASIC METABOLIC PANEL
ANION GAP: 10 (ref 5–15)
BUN: 10 mg/dL (ref 6–20)
CO2: 21 mmol/L — ABNORMAL LOW (ref 22–32)
Calcium: 9.3 mg/dL (ref 8.9–10.3)
Chloride: 104 mmol/L (ref 101–111)
Creatinine, Ser: 0.75 mg/dL (ref 0.44–1.00)
GFR calc Af Amer: >60 mL/min (ref >60)
Glucose, Bld: 131 mg/dL — ABNORMAL HIGH (ref 65–99)
POTASSIUM: 4.7 mmol/L (ref 3.5–5.1)
SODIUM: 135 mmol/L (ref 135–145)

## 2017-06-11 LAB — GLUCOSE, CAPILLARY
GLUCOSE-CAPILLARY: 136 mg/dL — AB (ref 65–99)
GLUCOSE-CAPILLARY: 134 mg/dL — AB (ref 65–99)
GLUCOSE-CAPILLARY: 160 mg/dL — AB (ref 65–99)

## 2017-06-11 LAB — HIV ANTIBODY (ROUTINE TESTING W REFLEX): HIV Screen 4th Generation wRfx: NONREACTIVE

## 2017-06-11 LAB — LACTIC ACID, PLASMA: LACTIC ACID, VENOUS: 1.5 mmol/L (ref 0.5–1.9)

## 2017-06-11 MED ORDER — CLINDAMYCIN HCL 300 MG PO CAPS: 300.0000 mg | ORAL_CAPSULE | Freq: Three times a day (TID) | ORAL | 0 refills | Status: AC

## 2017-06-11 MED ORDER — SULFAMETHOXAZOLE-TRIMETHOPRIM 800-160 MG PO TABS: 1.0000 | ORAL_TABLET | Freq: Two times a day (BID) | ORAL | 0 refills | Status: DC

## 2017-06-11 NOTE — Consult Note (Signed)
           [facility] Primary Care Navigator  06/11/2017  Danielle Buchanan 08/23/1956 [id number]   Went to see patient at the bedside to identify possible discharge needs. Patient reports having increased soreness/ pain to both feet resulting to difficulty walking and fever that had led to this admission.  Patient endorses Dr. Shirlean Mylararol Webb with [facility] as the primary care provider.    [facility] [address] and [facility] to obtain medications without any problem so far.   Patient reports managing her medications at home with sister's assistance using "pill box" system filled every 2 weeks.  Patient's brother in-law [name] provides transportation to her doctors' appointments.  [employer] transportation benefits discussed with patient as well.  She lives with sister [name] who will assist with her care needs at home as stated.    Anticipated discharge plan is home according to patient. Awaiting for therapy evaluation and recommendation.  Patient voiced understanding to call primary care provider's office when she returns home, for a post discharge follow-up appointment within a week or sooner if needed. Patient letter (with PCP's contact number) was provided as a reminder.  Explained to patient about [facility] CM services available for health management but she denies any current needs and declined services at this time. Patient states that sister is helping her manage her diabetes at home.  Patient verbalized that she will ask provider's help to refer her to diabetic classes after discharge. Patient also states that she will talk to providers on her follow-up visit about further management needs. [facility] patient advocate Eunice Blase(Debbie) was made aware of it when seen during this visit.  Encouraged patient to seek referral to [facility] care [facility] services if deemed necessary and appropriate for services in the future.     [facility] care [facility] contact information provided for future needs that may arise.    For questions, please contact:  Wyatt HasteLorraine Brazen Domangue, BSN, RN- [facility] Primary Care Navigator  Telephone: [phone] [facility]

## 2017-06-11 NOTE — Evaluation (Signed)
Physical Therapy Evaluation Patient Details Name: Danielle Buchanan MRN: [id number] DOB: 1956-01-27 Today's Date: 06/11/2017   History of Present Illness  Pt is a 61 y/o female admitted secondary to cellulitis on bilat feet. Pt scheduled for outpatient follow up for arteriogram. PMH includes DM, HTN, depression, and R carpal tunnel release.    Clinical Impression  Pt admitted secondary to problem above with deficits below. PTA, pt required assist with mobility and ADLs from sister and used rollator for ambulation. Upon eval, pt unsteady without use of AD and required HHA, use of IV pole, and min to min guard assist for mobility. Educated to continue use of Rollator at home to increase safety with mobility. Educated about recommendations for HHPT to work on strengthening and balance to increase safety with mobility. Refusing any other DME at this time. Will continue to follow acutely to maximize functional mobility independence and safety.     Follow Up Recommendations Home health PT;Supervision for mobility/OOB    Equipment Recommendations  None recommended by PT (refused 3 in 1)    Recommendations for Other Services       Precautions / Restrictions Precautions Precautions: Fall Precaution Comments: Pt reports history of falls. Reports sister has to help her get up when she falls. Reports she mostly falls out of the bed, so she now sleeps on mattress on the floor.  Restrictions Weight Bearing Restrictions: No      Mobility  Bed Mobility Overal bed mobility: Modified Independent             General bed mobility comments: Increased time required, but no external assist.   Transfers Overall transfer level: Needs assistance Equipment used: 1 person hand held assist Transfers: Sit to/from Stand Sit to Stand: Min assist         General transfer comment: Min A for steadying assist and lift assist.   Ambulation/Gait Ambulation/Gait assistance: Min assist;Min guard Ambulation  Distance (Feet): 100 Feet Assistive device: 1 person hand held assist (IV pole ) Gait Pattern/deviations: Step-through pattern;Decreased stride length;Trunk flexed Gait velocity: Decreased  Gait velocity interpretation: Below normal speed for age/gender General Gait Details: Slow, unsteady gait with use of IV pole and HHA for steadying. Verbal cues for upright posture and safe gait speed. Educated to use rollator at home for increased safety with mobility.   Stairs            Wheelchair Mobility    Modified Rankin (Stroke Patients Only)       Balance Overall balance assessment: Needs assistance Sitting-balance support: No upper extremity supported;Feet unsupported Sitting balance-Leahy Scale: Good     Standing balance support: Bilateral upper extremity supported;During functional activity Standing balance-Leahy Scale: Poor Standing balance comment: Reliant on UE support for steadying                             Pertinent Vitals/Pain Pain Assessment: No/denies pain    Home Living Family/patient expects to be discharged to:: Private residence Living Arrangements: Other relatives (sister ) Available Help at Discharge: Family;Available 24 hours/day Type of Home: Apartment Home Access: Level entry     Home Layout: One level Home Equipment: Walker - 4 wheels      Prior Function Level of Independence: Needs assistance   Gait / Transfers Assistance Needed: Uses Rollator for mobility. Reports she sometimes needs help with mobility from sister.   ADL's / Homemaking Assistance Needed: Sister helps with bathing and dressing  Hand Dominance   Dominant Hand: Right    Extremity/Trunk Assessment        Lower Extremity Assessment Lower Extremity Assessment: RLE deficits/detail;LLE deficits/detail;Generalized weakness RLE Deficits / Details: Tingling in feet. Feet wrapped in gauze secondary to wounds on feet.  LLE Deficits / Details: Tingling in  feet. Feet wrapped in gauze secondary to wounds on feet.     Cervical / Trunk Assessment Cervical / Trunk Assessment: Kyphotic  Communication   Communication: No difficulties  Cognition Arousal/Alertness: Awake/alert Behavior During Therapy: WFL for tasks assessed/performed Overall Cognitive Status: Within Functional Limits for tasks assessed                                        General Comments      Exercises     Assessment/Plan    PT Assessment Patient needs continued PT services  PT Problem List Decreased strength;Decreased balance;Decreased mobility;Decreased knowledge of use of DME;Impaired sensation       PT Treatment Interventions DME instruction;Gait training;Stair training;Functional mobility training;Balance training;Therapeutic exercise;Therapeutic activities;Neuromuscular re-education;Patient/family education    PT Goals (Current goals can be found in the Care Plan section)  Acute Rehab PT Goals Patient Stated Goal: to go home  PT Goal Formulation: With patient Time For Goal Achievement: 06/18/17 Potential to Achieve Goals: Good    Frequency Min 3X/week   Barriers to discharge        Co-evaluation               AM-PAC PT "6 Clicks" Daily Activity  Outcome Measure Difficulty turning over in bed (including adjusting bedclothes, sheets and blankets)?: None Difficulty moving from lying on back to sitting on the side of the bed? : None Difficulty sitting down on and standing up from a chair with arms (e.g., wheelchair, bedside commode, etc,.)?: Unable Help needed moving to and from a bed to chair (including a wheelchair)?: A Little Help needed walking in hospital room?: A Little Help needed climbing 3-5 steps with a railing? : A Lot 6 Click Score: 17    End of Session Equipment Utilized During Treatment: Gait belt Activity Tolerance: Patient tolerated treatment well Patient left: in bed;with call bell/phone within reach Nurse  Communication: Mobility status PT Visit Diagnosis: Unsteadiness on feet (R26.81);Other abnormalities of gait and mobility (R26.89);History of falling (Z91.81)    Time: 1610-9604 PT Time Calculation (min) (ACUTE ONLY): 23 min   Charges:   PT Evaluation $PT Eval Low Complexity: 1 Low PT Treatments $Gait Training: 8-22 mins   PT G Codes:        Gladys Damme, PT, DPT  Acute Rehabilitation Services  Pager: [phone]   Danielle Buchanan 06/11/2017, 4:57 PM

## 2017-06-11 NOTE — Progress Notes (Signed)
Patient Discharge: Disposition: Patient discharged to home. Education: Reviewed medications, prescriptions, follow-up appointments and discharge instructions. IV: Discontinued IV before discharge. Telemetry: Discontinued Tele before discharge, CCMD notified. Transportation: Patient escorted out of the unit in w/c accompanied by the family member. Belongings: Patient took all her belongings with her.

## 2017-06-11 NOTE — Progress Notes (Signed)
  Progress Note    06/11/2017 11:25 AM * No surgery found *  Subjective:  No new complaints  Vitals:   06/10/17 2218 06/11/17 0545  BP: (!) 157/76 (!) 146/72  Pulse: 83 95  Resp: 19 18  Temp: 98.2 F (36.8 C) 98.3 F (36.8 C)  SpO2: 95% 95%    Physical Exam: aaox3 Dressings on feet are cdi  CBC    Component Value Date/Time   WBC 9.4 06/10/2017 0223   RBC 4.29 06/10/2017 0223   HGB 12.2 06/10/2017 0223   HCT 37.1 06/10/2017 0223   PLT 191 06/10/2017 0223   MCV 86.5 06/10/2017 0223   MCH 28.4 06/10/2017 0223   MCHC 32.9 06/10/2017 0223   RDW 13.6 06/10/2017 0223   LYMPHSABS 2.5 06/10/2017 0223   MONOABS 0.4 06/10/2017 0223   EOSABS 0.4 06/10/2017 0223   BASOSABS 0.0 06/10/2017 0223    BMET    Component Value Date/Time   NA 131 (L) 06/10/2017 0223   K 3.5 06/10/2017 0223   CL 102 06/10/2017 0223   CO2 21 (L) 06/10/2017 0223   GLUCOSE 337 (H) 06/10/2017 0223   BUN 17 06/10/2017 0223   CREATININE 1.02 (H) 06/10/2017 0223   CALCIUM 8.2 (L) 06/10/2017 0223   GFRNONAA 58 (L) 06/10/2017 0223   GFRAA >60 06/10/2017 0223    INR    Component Value Date/Time   INR 0.92 06/09/2017 2105     Intake/Output Summary (Last 24 hours) at 06/11/17 1125 Last data filed at 06/11/17 1006  Gross per 24 hour  Intake          3101.67 ml  Output             1702 ml  Net          1399.67 ml     61 y.o. female with bilateral foot wounds   Patient with palpable pedal pulses and normal ABIs and waveforms. However toe pressures are less than 30 mmHg bilaterally and her wounds are not likely to heal due to likely microvascular disease from diabetes.   Needs arteriogram for possible pedal reconstruction at some point but can be as outpatient. Earliest time for inpatient would be next Monday.   Needs asa and statin. Discussed smoking cessation.  If she is to leave please contact me and I will arrange procedure as outpatient.    Rox Mcgriff C. Randie Heinzain, MD [facility] Office: [phone] Pager: [phone]  06/11/2017 11:25 AM

## 2017-06-12 ENCOUNTER — Encounter: Payer: Self-pay | Admitting: *Deleted

## 2017-06-12 ENCOUNTER — Other Ambulatory Visit: Payer: Self-pay | Admitting: *Deleted

## 2017-06-12 ENCOUNTER — Encounter (HOSPITAL_BASED_OUTPATIENT_CLINIC_OR_DEPARTMENT_OTHER): Payer: Medicare HMO

## 2017-06-12 NOTE — Discharge Summary (Signed)
Physician Discharge Summary  Danielle Buchanan DGL:[id number] DOB: 16-Dec-1955 DOA: 06/09/2017  PCP: [name], MD  Admit date: 06/09/2017 Discharge date: 06/11/2017  Admitted From: Home  Disposition:  Home.   Recommendations for Outpatient Follow-up:  1. Follow up with PCP in 1-2 weeks 2. Please obtain BMP/CBC in one week Please follow up with vascular surgery as recommended next week.   Home Health:yes.   Discharge Condition: stable.  CODE STATUS: full code.  Diet recommendation: Heart Healthy   Brief/Interim Summary: JaniceWeinkeis a 61 y.o.female,w uncontrolled dm2, hypertension, nicotine dependent, comes in for worsening left leg pain. She was found to have worsening bilateral foot wounds. She was admitted to medical service and vascular surgery consulted for recommendations. Plan for arteriogram later this week   Discharge Diagnoses:  Principal Problem:   Cellulitis Active Problems:   Diabetes (HCC)   HTN (hypertension)   Hyponatremia   Fever  Diabetic foot ulcers, infected, admitted for fever: It appears she had microvascular disease in addition to diabetic ulcers.  Vascular surgery consulted and recommendations were to follow up with an arteriogram as outpatient next week.  Pt wanted to be discharged and is adamant about it.  She was discharged on oral antibiotics and home health RN to follow up the ulcers    Uncontrolled DM with hyperglycemia:  CBG (last 3)   Recent Labs  06/11/17 0742 06/11/17 1145 06/11/17 1711  GLUCAP 136* 134* 160*    Resume home regimen of insulin.     Hypertension; controlled.      Discharge Instructions  Discharge Instructions    Diet - low sodium heart healthy    Complete by:  As directed    Discharge instructions    Complete by:  As directed    Please follow up with Dr Randie Heinz as recommended next for outpatient arteriogram and reconstruction .     Allergies as of 06/11/2017      Reactions   Doxycycline Rash       Medication List    TAKE these medications   amitriptyline 25 MG tablet Commonly known as:  ELAVIL Take 25 mg by mouth 2 (two) times daily.   cetirizine 10 MG tablet Commonly known as:  ZYRTEC Take 10 mg by mouth daily.   clindamycin 300 MG capsule Commonly known as:  CLEOCIN Take 1 capsule (300 mg total) by mouth 3 (three) times daily.   gabapentin 300 MG capsule Commonly known as:  NEURONTIN Take 300 mg by mouth See admin instructions. Take 300mg  once on day 1, then 300mg  twice on day 2, then 300mg  three times daily thereafter   HYDROcodone-acetaminophen 5-325 MG tablet Commonly known as:  NORCO/VICODIN Take 1 tablet by mouth every 6 (six) hours as needed for pain.   insulin NPH-regular Human (70-30) 100 UNIT/ML injection Commonly known as:  NOVOLIN 70/30 120 units with breakfast, and 90 units with the evening meal What changed:  how much to take  how to take this  when to take this  additional instructions   losartan 100 MG tablet Commonly known as:  COZAAR Take 100 mg by mouth daily.   mupirocin cream 2 % Commonly known as:  BACTROBAN Apply 1 application topically daily.   pravastatin 20 MG tablet Commonly known as:  PRAVACHOL Take 20 mg by mouth daily.   sertraline 100 MG tablet Commonly known as:  ZOLOFT Take 100 mg by mouth daily.   sulfamethoxazole-trimethoprim 800-160 MG tablet Commonly known as:  BACTRIM DS,SEPTRA DS Take 1 tablet by  mouth 2 (two) times daily.   traZODone 50 MG tablet Commonly known as:  DESYREL Take 50 mg by mouth at bedtime.            Discharge Care Instructions        Start     Ordered   06/11/17 0000  sulfamethoxazole-trimethoprim (BACTRIM DS,SEPTRA DS) 800-160 MG tablet  2 times daily     06/11/17 1538   06/11/17 0000  clindamycin (CLEOCIN) 300 MG capsule  3 times daily     06/11/17 1538   06/11/17 0000  Diet - low sodium heart healthy     06/11/17 1538   06/11/17 0000  Discharge instructions    Comments:   Please follow up with Dr Randie Heinz as recommended next for outpatient arteriogram and reconstruction .   06/11/17 1538     Follow-up Information    [name], MD. Schedule an appointment as soon as possible for a visit in 1 week(s).   Specialty:  Family Medicine Contact information: [address] [place] [address] [phone]          Allergies  Allergen Reactions  . Doxycycline Rash    Consultations:  Vascular surgery.    Procedures/Studies: Dg Chest 2 View  Result Date: 06/10/2017 CLINICAL DATA:  Fever. EXAM: CHEST  2 VIEW COMPARISON:  None. FINDINGS: Heart is enlarged. Lung volumes are low. There is no edema or effusion. No focal airspace consolidation is present. The visualized soft tissues and bony thorax are unremarkable. IMPRESSION: 1. Cardiomegaly without failure. 2. Low lung volumes. Electronically Signed   By: Marin Roberts M.D.   On: 06/10/2017 07:37   Dg Foot Complete Left  Result Date: 06/09/2017 CLINICAL DATA:  Bilateral foot infection. Diabetic foot ulcers for 2 months. EXAM: LEFT FOOT - COMPLETE 3+ VIEW COMPARISON:  None. FINDINGS: Probable ulcer about the distal aspect of the great toe. No tracking soft tissue air. No radiopaque foreign body. No associated periosteal reaction or bony destructive change. No additional soft tissue ulcer is seen radiographically. Plantar calcaneal spur. No fracture or dislocation. IMPRESSION: Probable ulcer about the distal great toe. No radiographic evidence of osteomyelitis. Electronically Signed   By: Rubye Oaks M.D.   On: 06/09/2017 23:00   Dg Foot Complete Right  Result Date: 06/09/2017 CLINICAL DATA:  Bilateral foot infection.  Diabetic foot ulcer. EXAM: RIGHT FOOT COMPLETE - 3+ VIEW COMPARISON:  None. FINDINGS: Suspect soft tissue ulcer about the fourth-fifth interspace with probable skin defect. No radiopaque foreign body. No radiographic evidence of bony destructive change or periosteal  reaction. No additional findings of soft tissue ulcer. No fracture dislocation. Plantar calcaneal spur. Ossification within Kager's fat pad which appears chronic. IMPRESSION: Suspected soft tissue ulcer about the fourth- fifth interspace. No radiographic evidence of osteomyelitis. Electronically Signed   By: Rubye Oaks M.D.   On: 06/09/2017 23:02       Subjective: No new complaints.   Discharge Exam: Vitals:   06/10/17 2218 06/11/17 0545  BP: (!) 157/76 (!) 146/72  Pulse: 83 95  Resp: 19 18  Temp: 98.2 F (36.8 C) 98.3 F (36.8 C)  SpO2: 95% 95%   Vitals:   06/10/17 0742 06/10/17 1624 06/10/17 2218 06/11/17 0545  BP: 123/61 (!) 142/71 (!) 157/76 (!) 146/72  Pulse: 67 75 83 95  Resp: 20 20 19 18   Temp: 97.7 F (36.5 C) 98.2 F (36.8 C) 98.2 F (36.8 C) 98.3 F (36.8 C)  TempSrc: Oral Oral Oral  Oral  SpO2: 95% 95% 95% 95%  Weight:   109 kg (240 lb 4.8 oz)   Height:        General: Pt is alert, awake, not in acute distress Cardiovascular: RRR, S1/S2 +, no rubs, no gallops Respiratory: CTA bilaterally, no wheezing, no rhonchi Abdominal: Soft, NT, ND, bowel sounds + Extremities: no edema, no cyanosis    The results of significant diagnostics from this hospitalization (including imaging, microbiology, ancillary and laboratory) are listed below for reference.     Microbiology: Recent Results (from the past 240 hour(s))  Culture, blood (Routine x 2)     Status: None (Preliminary result)   Collection Time: 06/09/17  9:05 PM  Result Value Ref Range Status   Specimen Description BLOOD RIGHT HAND  Final   Special Requests   Final    BOTTLES DRAWN AEROBIC AND ANAEROBIC Blood Culture adequate volume   Culture NO GROWTH 3 DAYS  Final   Report Status PENDING  Incomplete  Culture, blood (Routine x 2)     Status: None (Preliminary result)   Collection Time: 06/09/17  9:22 PM  Result Value Ref Range Status   Specimen Description BLOOD LEFT HAND  Final   Special  Requests   Final    BOTTLES DRAWN AEROBIC AND ANAEROBIC Blood Culture adequate volume   Culture NO GROWTH 3 DAYS  Final   Report Status PENDING  Incomplete     Labs: BNP (last 3 results) No results for input(s): BNP in the last 8760 hours. Basic Metabolic Panel:  Recent Labs Lab 06/09/17 2105 06/10/17 0223 06/11/17 1204  NA 125* 131* 135  K 4.1 3.5 4.7  CL 92* 102 104  CO2 21* 21* 21*  GLUCOSE 452* 337* 131*  BUN 19 17 10   CREATININE 1.19* 1.02* 0.75  CALCIUM 8.9 8.2* 9.3   Liver Function Tests:  Recent Labs Lab 06/09/17 2105 06/10/17 0223  AST 21 20  ALT 25 22  ALKPHOS 86 72  BILITOT 0.5 0.6  PROT 6.8 6.0*  ALBUMIN 3.6 3.2*   No results for input(s): LIPASE, AMYLASE in the last 168 hours. No results for input(s): AMMONIA in the last 168 hours. CBC:  Recent Labs Lab 06/09/17 2105 06/10/17 0223  WBC 11.0* 9.4  NEUTROABS 8.2* 6.0  HGB 13.4 12.2  HCT 39.7 37.1  MCV 86.5 86.5  PLT 205 191   Cardiac Enzymes: No results for input(s): CKTOTAL, CKMB, CKMBINDEX, TROPONINI in the last 168 hours. BNP: Invalid input(s): POCBNP CBG:  Recent Labs Lab 06/10/17 1729 06/10/17 2216 06/11/17 0742 06/11/17 1145 06/11/17 1711  GLUCAP 132* 127* 136* 134* 160*   D-Dimer No results for input(s): DDIMER in the last 72 hours. Hgb A1c  Recent Labs  06/10/17 0223  HGBA1C 11.6*   Lipid Profile No results for input(s): CHOL, HDL, LDLCALC, TRIG, CHOLHDL, LDLDIRECT in the last 72 hours. Thyroid function studies No results for input(s): TSH, T4TOTAL, T3FREE, THYROIDAB in the last 72 hours.  Invalid input(s): FREET3 Anemia work up No results for input(s): VITAMINB12, FOLATE, FERRITIN, TIBC, IRON, RETICCTPCT in the last 72 hours. Urinalysis    Component Value Date/Time   COLORURINE YELLOW 06/10/2017 1422   APPEARANCEUR CLEAR 06/10/2017 1422   LABSPEC 1.018 06/10/2017 1422   PHURINE 5.0 06/10/2017 1422   GLUCOSEU >=500 (A) 06/10/2017 1422   HGBUR NEGATIVE  06/10/2017 1422   BILIRUBINUR NEGATIVE 06/10/2017 1422   KETONESUR NEGATIVE 06/10/2017 1422   PROTEINUR NEGATIVE 06/10/2017 1422   NITRITE NEGATIVE 06/10/2017 1422  LEUKOCYTESUR NEGATIVE 06/10/2017 1422   Sepsis Labs Invalid input(s): PROCALCITONIN,  WBC,  LACTICIDVEN Microbiology Recent Results (from the past 240 hour(s))  Culture, blood (Routine x 2)     Status: None (Preliminary result)   Collection Time: 06/09/17  9:05 PM  Result Value Ref Range Status   Specimen Description BLOOD RIGHT HAND  Final   Special Requests   Final    BOTTLES DRAWN AEROBIC AND ANAEROBIC Blood Culture adequate volume   Culture NO GROWTH 3 DAYS  Final   Report Status PENDING  Incomplete  Culture, blood (Routine x 2)     Status: None (Preliminary result)   Collection Time: 06/09/17  9:22 PM  Result Value Ref Range Status   Specimen Description BLOOD LEFT HAND  Final   Special Requests   Final    BOTTLES DRAWN AEROBIC AND ANAEROBIC Blood Culture adequate volume   Culture NO GROWTH 3 DAYS  Final   Report Status PENDING  Incomplete     Time coordinating discharge: Over 30 minutes  SIGNED:   Kathlen ModyAKULA,Janiyha Montufar, MD  [facility] 06/12/2017, 10:29 PM Pager   If 7PM-7AM, please contact night-coverage [email or web address] Password [email or web address]

## 2017-06-12 NOTE — Progress Notes (Signed)
Patient DC 9/4, spoke with her on the phone, discussed HH needs, she would like to use [facility]. [facility]

## 2017-06-13 NOTE — Care Management Note (Signed)
Case Management Note        LATE ENTRY  Patient Details  Name: Danielle Buchanan MRN: [id number] Date of Birth: [birth year]  Subjective/Objective:      CM  Following for progression and d/c planning.               Action/Plan: 06/13/2017 Informed by PT on 06/11/2017 at 4:55 pm that pt will need HHPT, CM unable to arrange this due to hour. This CM called pt on 06/13/2017 [facility] and [facility] and they have called her at home to arrange an appointment for today at 4-5pm. Pt confirmed that she has DME. Pt is unable to recall the name of the agency providing [facility].   Expected Discharge Date:  06/11/17               Expected Discharge Plan:  Home w Home Health Services  In-House Referral:  NA  Discharge planning Services  CM Consult  Post Acute Care Choice:  Home Health Choice offered to:  Patient  DME Arranged:  N/A DME Agency:     HH Arranged:  PT HH Agency:     Status of Service:  Completed, signed off  If discussed at Long Length of Stay Meetings, dates discussed:    Additional Comments:  Starlyn SkeansRoyal, Galen Russman U, RN 06/13/2017, 10:31 AM

## 2017-06-14 LAB — CULTURE, BLOOD (ROUTINE X 2)
Culture: NO GROWTH
Culture: NO GROWTH
Special Requests: ADEQUATE
Special Requests: ADEQUATE

## 2017-06-17 ENCOUNTER — Encounter: Payer: Medicare HMO | Admitting: Surgery

## 2017-06-17 DIAGNOSIS — Z09 Encounter for follow-up examination after completed treatment for conditions other than malignant neoplasm: Secondary | ICD-10-CM | POA: Diagnosis not present

## 2017-06-17 DIAGNOSIS — Z5181 Encounter for therapeutic drug level monitoring: Secondary | ICD-10-CM | POA: Diagnosis not present

## 2017-06-17 DIAGNOSIS — Z23 Encounter for immunization: Secondary | ICD-10-CM | POA: Diagnosis not present

## 2017-06-17 DIAGNOSIS — E1152 Type 2 diabetes mellitus with diabetic peripheral angiopathy with gangrene: Secondary | ICD-10-CM | POA: Diagnosis not present

## 2017-06-17 DIAGNOSIS — I739 Peripheral vascular disease, unspecified: Secondary | ICD-10-CM | POA: Diagnosis not present

## 2017-06-19 ENCOUNTER — Encounter (HOSPITAL_BASED_OUTPATIENT_CLINIC_OR_DEPARTMENT_OTHER): Payer: Medicare HMO | Attending: Surgery

## 2017-06-19 DIAGNOSIS — E1140 Type 2 diabetes mellitus with diabetic neuropathy, unspecified: Secondary | ICD-10-CM | POA: Diagnosis not present

## 2017-06-19 DIAGNOSIS — E11621 Type 2 diabetes mellitus with foot ulcer: Secondary | ICD-10-CM | POA: Insufficient documentation

## 2017-06-19 DIAGNOSIS — I70245 Atherosclerosis of native arteries of left leg with ulceration of other part of foot: Secondary | ICD-10-CM | POA: Insufficient documentation

## 2017-06-19 DIAGNOSIS — L97512 Non-pressure chronic ulcer of other part of right foot with fat layer exposed: Secondary | ICD-10-CM | POA: Insufficient documentation

## 2017-06-19 DIAGNOSIS — F17210 Nicotine dependence, cigarettes, uncomplicated: Secondary | ICD-10-CM | POA: Diagnosis not present

## 2017-06-19 DIAGNOSIS — L97526 Non-pressure chronic ulcer of other part of left foot with bone involvement without evidence of necrosis: Secondary | ICD-10-CM | POA: Diagnosis not present

## 2017-06-19 DIAGNOSIS — I10 Essential (primary) hypertension: Secondary | ICD-10-CM | POA: Diagnosis not present

## 2017-06-19 DIAGNOSIS — I70235 Atherosclerosis of native arteries of right leg with ulceration of other part of foot: Secondary | ICD-10-CM | POA: Diagnosis not present

## 2017-06-19 DIAGNOSIS — E1142 Type 2 diabetes mellitus with diabetic polyneuropathy: Secondary | ICD-10-CM | POA: Insufficient documentation

## 2017-06-19 DIAGNOSIS — L97522 Non-pressure chronic ulcer of other part of left foot with fat layer exposed: Secondary | ICD-10-CM | POA: Diagnosis not present

## 2017-06-24 ENCOUNTER — Encounter (HOSPITAL_COMMUNITY)
Admission: RE | Disposition: A | Discharge status: Home / Self Care | Payer: Self-pay | Source: Ambulatory Visit | Attending: Vascular Surgery

## 2017-06-24 ENCOUNTER — Ambulatory Visit (HOSPITAL_COMMUNITY)
Admission: RE | Admit: 2017-06-24 | Discharge: 2017-06-24 | Disposition: A | Discharge status: Home / Self Care | Payer: Medicare HMO | Source: Ambulatory Visit | Attending: Vascular Surgery | Admitting: Vascular Surgery

## 2017-06-24 DIAGNOSIS — Z87891 Personal history of nicotine dependence: Secondary | ICD-10-CM | POA: Diagnosis not present

## 2017-06-24 DIAGNOSIS — E1151 Type 2 diabetes mellitus with diabetic peripheral angiopathy without gangrene: Secondary | ICD-10-CM | POA: Diagnosis present

## 2017-06-24 DIAGNOSIS — E11621 Type 2 diabetes mellitus with foot ulcer: Secondary | ICD-10-CM | POA: Diagnosis not present

## 2017-06-24 DIAGNOSIS — I70245 Atherosclerosis of native arteries of left leg with ulceration of other part of foot: Secondary | ICD-10-CM | POA: Insufficient documentation

## 2017-06-24 DIAGNOSIS — L97519 Non-pressure chronic ulcer of other part of right foot with unspecified severity: Secondary | ICD-10-CM | POA: Diagnosis not present

## 2017-06-24 DIAGNOSIS — I70213 Atherosclerosis of native arteries of extremities with intermittent claudication, bilateral legs: Secondary | ICD-10-CM | POA: Diagnosis not present

## 2017-06-24 DIAGNOSIS — L97529 Non-pressure chronic ulcer of other part of left foot with unspecified severity: Secondary | ICD-10-CM | POA: Insufficient documentation

## 2017-06-24 HISTORY — PX: ABDOMINAL AORTOGRAM W/LOWER EXTREMITY: CATH118223

## 2017-06-24 LAB — POCT I-STAT, CHEM 8
BUN: 14 mg/dL (ref 6–20)
CALCIUM ION: 1.19 mmol/L (ref 1.15–1.40)
Chloride: 102 mmol/L (ref 101–111)
Creatinine, Ser: 0.6 mg/dL (ref 0.44–1.00)
Glucose, Bld: 253 mg/dL — ABNORMAL HIGH (ref 65–99)
HCT: 42 % (ref 36.0–46.0)
HEMOGLOBIN: 14.3 g/dL (ref 12.0–15.0)
Potassium: 4.4 mmol/L (ref 3.5–5.1)
SODIUM: 134 mmol/L — AB (ref 135–145)
TCO2: 25 mmol/L (ref 22–32)

## 2017-06-24 LAB — GLUCOSE, CAPILLARY
GLUCOSE-CAPILLARY: 232 mg/dL — AB (ref 65–99)
Glucose-Capillary: 212 mg/dL — ABNORMAL HIGH (ref 65–99)

## 2017-06-24 SURGERY — ABDOMINAL AORTOGRAM W/LOWER EXTREMITY
Anesthesia: LOCAL

## 2017-06-24 MED ORDER — OXYCODONE HCL 5 MG PO TABS: 5.0000 mg | ORAL_TABLET | ORAL | Status: DC | PRN

## 2017-06-24 MED ORDER — FENTANYL CITRATE (PF) 100 MCG/2ML IJ SOLN
INTRAMUSCULAR | Status: DC | PRN
Start: 2017-06-24 — End: 2017-06-24
  Administered 2017-06-24: 50 ug via INTRAVENOUS

## 2017-06-24 MED ORDER — MIDAZOLAM HCL 2 MG/2ML IJ SOLN
INTRAMUSCULAR | Status: DC | PRN
  Administered 2017-06-24: 1 mg via INTRAVENOUS

## 2017-06-24 MED ORDER — SODIUM CHLORIDE 0.9 % IV SOLN
INTRAVENOUS | Status: DC
  Administered 2017-06-24: 13:00:00 via INTRAVENOUS

## 2017-06-24 MED ORDER — SODIUM CHLORIDE 0.9% FLUSH: 3.0000 mL | Freq: Two times a day (BID) | INTRAVENOUS | Status: DC

## 2017-06-24 MED ORDER — FENTANYL CITRATE (PF) 100 MCG/2ML IJ SOLN
INTRAMUSCULAR | Status: AC
  Filled 2017-06-24: qty 2

## 2017-06-24 MED ORDER — LIDOCAINE HCL (PF) 1 % IJ SOLN
INTRAMUSCULAR | Status: DC | PRN
  Administered 2017-06-24: 15 mL

## 2017-06-24 MED ORDER — HEPARIN (PORCINE) IN NACL 2-0.9 UNIT/ML-% IJ SOLN
INTRAMUSCULAR | Status: AC
  Filled 2017-06-24: qty 1000

## 2017-06-24 MED ORDER — MIDAZOLAM HCL 2 MG/2ML IJ SOLN
INTRAMUSCULAR | Status: AC
  Filled 2017-06-24: qty 2

## 2017-06-24 MED ORDER — HEPARIN (PORCINE) IN NACL 2-0.9 UNIT/ML-% IJ SOLN
INTRAMUSCULAR | Status: AC | PRN
  Administered 2017-06-24: 1000 mL via INTRA_ARTERIAL

## 2017-06-24 MED ORDER — LIDOCAINE HCL (PF) 1 % IJ SOLN
INTRAMUSCULAR | Status: AC
  Filled 2017-06-24: qty 30

## 2017-06-24 MED ORDER — LABETALOL HCL 5 MG/ML IV SOLN: 10.0000 mg | INTRAVENOUS | Status: DC | PRN

## 2017-06-24 MED ORDER — SODIUM CHLORIDE 0.9% FLUSH: 3.0000 mL | INTRAVENOUS | Status: DC | PRN

## 2017-06-24 MED ORDER — HYDRALAZINE HCL 20 MG/ML IJ SOLN: 5.0000 mg | INTRAMUSCULAR | Status: DC | PRN

## 2017-06-24 MED ORDER — IODIXANOL 320 MG/ML IV SOLN
INTRAVENOUS | Status: DC | PRN
  Administered 2017-06-24: 167 mL via INTRA_ARTERIAL

## 2017-06-24 MED ORDER — SODIUM CHLORIDE 0.9 % IV SOLN: 250.0000 mL | INTRAVENOUS | Status: DC | PRN

## 2017-06-24 MED ORDER — SODIUM CHLORIDE 0.9 % WEIGHT BASED INFUSION: 1.0000 mL/kg/h | INTRAVENOUS | Status: DC

## 2017-06-24 SURGICAL SUPPLY — 10 items
CATH OMNI FLUSH 5F 65CM (CATHETERS) ×2 IMPLANT
COVER PRB 48X5XTLSCP FOLD TPE (BAG) ×1 IMPLANT
COVER PROBE 5X48 (BAG) ×1
KIT MICROINTRODUCER STIFF 5F (SHEATH) ×2 IMPLANT
KIT PV (KITS) ×2 IMPLANT
SHEATH PINNACLE 5F 10CM (SHEATH) ×2 IMPLANT
SYR MEDRAD MARK V 150ML (SYRINGE) ×2 IMPLANT
TRANSDUCER W/STOPCOCK (MISCELLANEOUS) ×2 IMPLANT
TRAY PV CATH (CUSTOM PROCEDURE TRAY) ×2 IMPLANT
WIRE BENTSON .035X145CM (WIRE) ×2 IMPLANT

## 2017-06-24 NOTE — H&P (Signed)
   History and Physical Update  The patient was interviewed and re-examined.  The patient's previous History and Physical has been reviewed and is unchanged from recent hospitalization. Plan for aortogram with bilateral runoff and possible intervention.  Brandon C. Randie Heinz, MD [facility] [place] Office: [phone] Pager: [phone]   06/24/2017, 12:03 PM

## 2017-06-24 NOTE — Discharge Instructions (Signed)

## 2017-06-24 NOTE — Op Note (Signed)
    Patient name: Jocelynn Gioffre MRN: [id number] DOB: 05/24/1956 Sex: female  06/24/2017 Pre-operative Diagnosis: bilateral toe ulceration Post-operative diagnosis:  Same Surgeon:  Apolinar Junes C. Randie Heinz, MD Procedure Performed: 1.  US guided cannulation right common femoral artery 2.  Aortogram with bilateral lower extremity runoff 3.  Moderate sedation with fentanyl and versed for 19 minutes  Indications:  61 year old female with diabetes and recently quit smoking with bilateral toe ulcerations the left greater than the right.  Findings: The aortoiliac segments are patent without limiting disease. There is a 30% stenosis of the left SFA otherwise patent runoff to the popliteal arteries bilaterally. Predominant runoff on the right is via the peroneal artery was not much appreciable flow on the right foot. On left side dominant runoff is via the posterior tibial artery there is also had a peroneal artery. There is no appreciable plantar arch on the left either.   Procedure:  The patient was identified in the holding area and taken to room 8.  The patient was then placed supine on the table and prepped and draped in the usual sterile fashion.  A time out was called.  Ultrasound was used to evaluate the right common femoral artery.  It was patent .  A digital ultrasound image was acquired.  A micropuncture needle was used to access the right common femoral artery under ultrasound guidance.  An 018 wire was advanced without resistance and a micropuncture sheath was placed.  The 018 wire was removed and a benson wire was placed.  The micropuncture sheath was exchanged for a 5 french sheath.  An omniflush catheter was advanced over the wire to the level of L-1.  An abdominal angiogram was obtained followed by bilateral runoff with spot views of the feet. No intervention was undertaken.    Contrast: 167cc  Climmie Cronce C. Randie Heinz, MD [facility] [place] Office: [phone] Pager:  [phone]

## 2017-06-25 ENCOUNTER — Encounter (HOSPITAL_COMMUNITY): Payer: Self-pay | Admitting: Vascular Surgery

## 2017-06-26 ENCOUNTER — Ambulatory Visit: Payer: Medicare HMO | Admitting: Endocrinology

## 2017-06-26 DIAGNOSIS — I70235 Atherosclerosis of native arteries of right leg with ulceration of other part of foot: Secondary | ICD-10-CM | POA: Diagnosis not present

## 2017-06-26 DIAGNOSIS — E1142 Type 2 diabetes mellitus with diabetic polyneuropathy: Secondary | ICD-10-CM | POA: Diagnosis not present

## 2017-06-26 DIAGNOSIS — L97526 Non-pressure chronic ulcer of other part of left foot with bone involvement without evidence of necrosis: Secondary | ICD-10-CM | POA: Diagnosis not present

## 2017-06-26 DIAGNOSIS — L97512 Non-pressure chronic ulcer of other part of right foot with fat layer exposed: Secondary | ICD-10-CM | POA: Diagnosis not present

## 2017-06-26 DIAGNOSIS — I10 Essential (primary) hypertension: Secondary | ICD-10-CM | POA: Diagnosis not present

## 2017-06-26 DIAGNOSIS — F17210 Nicotine dependence, cigarettes, uncomplicated: Secondary | ICD-10-CM | POA: Diagnosis not present

## 2017-06-26 DIAGNOSIS — E1140 Type 2 diabetes mellitus with diabetic neuropathy, unspecified: Secondary | ICD-10-CM | POA: Diagnosis not present

## 2017-06-26 DIAGNOSIS — E11621 Type 2 diabetes mellitus with foot ulcer: Secondary | ICD-10-CM | POA: Diagnosis not present

## 2017-06-26 DIAGNOSIS — I70245 Atherosclerosis of native arteries of left leg with ulceration of other part of foot: Secondary | ICD-10-CM | POA: Diagnosis not present

## 2017-06-27 DIAGNOSIS — Z9181 History of falling: Secondary | ICD-10-CM | POA: Diagnosis not present

## 2017-06-27 DIAGNOSIS — F17218 Nicotine dependence, cigarettes, with other nicotine-induced disorders: Secondary | ICD-10-CM | POA: Diagnosis not present

## 2017-06-27 DIAGNOSIS — E11621 Type 2 diabetes mellitus with foot ulcer: Secondary | ICD-10-CM | POA: Diagnosis not present

## 2017-06-27 DIAGNOSIS — L97511 Non-pressure chronic ulcer of other part of right foot limited to breakdown of skin: Secondary | ICD-10-CM | POA: Diagnosis not present

## 2017-06-27 DIAGNOSIS — L97521 Non-pressure chronic ulcer of other part of left foot limited to breakdown of skin: Secondary | ICD-10-CM | POA: Diagnosis not present

## 2017-06-27 DIAGNOSIS — E1152 Type 2 diabetes mellitus with diabetic peripheral angiopathy with gangrene: Secondary | ICD-10-CM | POA: Diagnosis not present

## 2017-06-27 DIAGNOSIS — Z792 Long term (current) use of antibiotics: Secondary | ICD-10-CM | POA: Diagnosis not present

## 2017-06-27 DIAGNOSIS — I70245 Atherosclerosis of native arteries of left leg with ulceration of other part of foot: Secondary | ICD-10-CM | POA: Diagnosis not present

## 2017-06-28 DIAGNOSIS — L97509 Non-pressure chronic ulcer of other part of unspecified foot with unspecified severity: Secondary | ICD-10-CM | POA: Diagnosis not present

## 2017-06-28 DIAGNOSIS — E7800 Pure hypercholesterolemia, unspecified: Secondary | ICD-10-CM | POA: Diagnosis not present

## 2017-06-28 DIAGNOSIS — I10 Essential (primary) hypertension: Secondary | ICD-10-CM | POA: Diagnosis not present

## 2017-06-28 DIAGNOSIS — E1165 Type 2 diabetes mellitus with hyperglycemia: Secondary | ICD-10-CM | POA: Diagnosis not present

## 2017-07-01 DIAGNOSIS — E1152 Type 2 diabetes mellitus with diabetic peripheral angiopathy with gangrene: Secondary | ICD-10-CM | POA: Diagnosis not present

## 2017-07-01 DIAGNOSIS — L97521 Non-pressure chronic ulcer of other part of left foot limited to breakdown of skin: Secondary | ICD-10-CM | POA: Diagnosis not present

## 2017-07-01 DIAGNOSIS — F17218 Nicotine dependence, cigarettes, with other nicotine-induced disorders: Secondary | ICD-10-CM | POA: Diagnosis not present

## 2017-07-01 DIAGNOSIS — E11621 Type 2 diabetes mellitus with foot ulcer: Secondary | ICD-10-CM | POA: Diagnosis not present

## 2017-07-01 DIAGNOSIS — I70245 Atherosclerosis of native arteries of left leg with ulceration of other part of foot: Secondary | ICD-10-CM | POA: Diagnosis not present

## 2017-07-01 DIAGNOSIS — L97511 Non-pressure chronic ulcer of other part of right foot limited to breakdown of skin: Secondary | ICD-10-CM | POA: Diagnosis not present

## 2017-07-01 DIAGNOSIS — Z792 Long term (current) use of antibiotics: Secondary | ICD-10-CM | POA: Diagnosis not present

## 2017-07-01 DIAGNOSIS — Z9181 History of falling: Secondary | ICD-10-CM | POA: Diagnosis not present

## 2017-07-03 DIAGNOSIS — Z792 Long term (current) use of antibiotics: Secondary | ICD-10-CM | POA: Diagnosis not present

## 2017-07-03 DIAGNOSIS — L97521 Non-pressure chronic ulcer of other part of left foot limited to breakdown of skin: Secondary | ICD-10-CM | POA: Diagnosis not present

## 2017-07-03 DIAGNOSIS — I10 Essential (primary) hypertension: Secondary | ICD-10-CM | POA: Diagnosis not present

## 2017-07-03 DIAGNOSIS — I70235 Atherosclerosis of native arteries of right leg with ulceration of other part of foot: Secondary | ICD-10-CM | POA: Diagnosis not present

## 2017-07-03 DIAGNOSIS — E1142 Type 2 diabetes mellitus with diabetic polyneuropathy: Secondary | ICD-10-CM | POA: Diagnosis not present

## 2017-07-03 DIAGNOSIS — L97526 Non-pressure chronic ulcer of other part of left foot with bone involvement without evidence of necrosis: Secondary | ICD-10-CM | POA: Diagnosis not present

## 2017-07-03 DIAGNOSIS — F17210 Nicotine dependence, cigarettes, uncomplicated: Secondary | ICD-10-CM | POA: Diagnosis not present

## 2017-07-03 DIAGNOSIS — Z9181 History of falling: Secondary | ICD-10-CM | POA: Diagnosis not present

## 2017-07-03 DIAGNOSIS — I70245 Atherosclerosis of native arteries of left leg with ulceration of other part of foot: Secondary | ICD-10-CM | POA: Diagnosis not present

## 2017-07-03 DIAGNOSIS — L97512 Non-pressure chronic ulcer of other part of right foot with fat layer exposed: Secondary | ICD-10-CM | POA: Diagnosis not present

## 2017-07-03 DIAGNOSIS — E11621 Type 2 diabetes mellitus with foot ulcer: Secondary | ICD-10-CM | POA: Diagnosis not present

## 2017-07-03 DIAGNOSIS — L97511 Non-pressure chronic ulcer of other part of right foot limited to breakdown of skin: Secondary | ICD-10-CM | POA: Diagnosis not present

## 2017-07-03 DIAGNOSIS — F17218 Nicotine dependence, cigarettes, with other nicotine-induced disorders: Secondary | ICD-10-CM | POA: Diagnosis not present

## 2017-07-03 DIAGNOSIS — E1152 Type 2 diabetes mellitus with diabetic peripheral angiopathy with gangrene: Secondary | ICD-10-CM | POA: Diagnosis not present

## 2017-07-03 DIAGNOSIS — E1140 Type 2 diabetes mellitus with diabetic neuropathy, unspecified: Secondary | ICD-10-CM | POA: Diagnosis not present

## 2017-07-05 DIAGNOSIS — L97511 Non-pressure chronic ulcer of other part of right foot limited to breakdown of skin: Secondary | ICD-10-CM | POA: Diagnosis not present

## 2017-07-05 DIAGNOSIS — Z9181 History of falling: Secondary | ICD-10-CM | POA: Diagnosis not present

## 2017-07-05 DIAGNOSIS — F17218 Nicotine dependence, cigarettes, with other nicotine-induced disorders: Secondary | ICD-10-CM | POA: Diagnosis not present

## 2017-07-05 DIAGNOSIS — L97521 Non-pressure chronic ulcer of other part of left foot limited to breakdown of skin: Secondary | ICD-10-CM | POA: Diagnosis not present

## 2017-07-05 DIAGNOSIS — Z792 Long term (current) use of antibiotics: Secondary | ICD-10-CM | POA: Diagnosis not present

## 2017-07-05 DIAGNOSIS — E1152 Type 2 diabetes mellitus with diabetic peripheral angiopathy with gangrene: Secondary | ICD-10-CM | POA: Diagnosis not present

## 2017-07-05 DIAGNOSIS — I70245 Atherosclerosis of native arteries of left leg with ulceration of other part of foot: Secondary | ICD-10-CM | POA: Diagnosis not present

## 2017-07-05 DIAGNOSIS — E11621 Type 2 diabetes mellitus with foot ulcer: Secondary | ICD-10-CM | POA: Diagnosis not present

## 2017-07-08 DIAGNOSIS — Z792 Long term (current) use of antibiotics: Secondary | ICD-10-CM | POA: Diagnosis not present

## 2017-07-08 DIAGNOSIS — F17218 Nicotine dependence, cigarettes, with other nicotine-induced disorders: Secondary | ICD-10-CM | POA: Diagnosis not present

## 2017-07-08 DIAGNOSIS — E11621 Type 2 diabetes mellitus with foot ulcer: Secondary | ICD-10-CM | POA: Diagnosis not present

## 2017-07-08 DIAGNOSIS — I70245 Atherosclerosis of native arteries of left leg with ulceration of other part of foot: Secondary | ICD-10-CM | POA: Diagnosis not present

## 2017-07-08 DIAGNOSIS — Z9181 History of falling: Secondary | ICD-10-CM | POA: Diagnosis not present

## 2017-07-08 DIAGNOSIS — L97511 Non-pressure chronic ulcer of other part of right foot limited to breakdown of skin: Secondary | ICD-10-CM | POA: Diagnosis not present

## 2017-07-08 DIAGNOSIS — E1152 Type 2 diabetes mellitus with diabetic peripheral angiopathy with gangrene: Secondary | ICD-10-CM | POA: Diagnosis not present

## 2017-07-08 DIAGNOSIS — L97521 Non-pressure chronic ulcer of other part of left foot limited to breakdown of skin: Secondary | ICD-10-CM | POA: Diagnosis not present

## 2017-07-09 DIAGNOSIS — L97513 Non-pressure chronic ulcer of other part of right foot with necrosis of muscle: Secondary | ICD-10-CM | POA: Diagnosis not present

## 2017-07-09 DIAGNOSIS — Z794 Long term (current) use of insulin: Secondary | ICD-10-CM | POA: Diagnosis not present

## 2017-07-09 DIAGNOSIS — E11621 Type 2 diabetes mellitus with foot ulcer: Secondary | ICD-10-CM | POA: Diagnosis not present

## 2017-07-09 DIAGNOSIS — L97523 Non-pressure chronic ulcer of other part of left foot with necrosis of muscle: Secondary | ICD-10-CM | POA: Diagnosis not present

## 2017-07-12 ENCOUNTER — Encounter (HOSPITAL_COMMUNITY): Payer: Self-pay | Admitting: Emergency Medicine

## 2017-07-12 ENCOUNTER — Emergency Department (HOSPITAL_COMMUNITY)
Admission: EM | Admit: 2017-07-12 | Discharge: 2017-07-12 | Disposition: A | Discharge status: Home / Self Care | Payer: Medicare HMO | Attending: Emergency Medicine | Admitting: Emergency Medicine

## 2017-07-12 DIAGNOSIS — L03116 Cellulitis of left lower limb: Secondary | ICD-10-CM | POA: Diagnosis not present

## 2017-07-12 DIAGNOSIS — I10 Essential (primary) hypertension: Secondary | ICD-10-CM | POA: Insufficient documentation

## 2017-07-12 DIAGNOSIS — L97521 Non-pressure chronic ulcer of other part of left foot limited to breakdown of skin: Secondary | ICD-10-CM | POA: Diagnosis not present

## 2017-07-12 DIAGNOSIS — Z87891 Personal history of nicotine dependence: Secondary | ICD-10-CM | POA: Diagnosis not present

## 2017-07-12 DIAGNOSIS — L97529 Non-pressure chronic ulcer of other part of left foot with unspecified severity: Secondary | ICD-10-CM | POA: Diagnosis not present

## 2017-07-12 DIAGNOSIS — F17218 Nicotine dependence, cigarettes, with other nicotine-induced disorders: Secondary | ICD-10-CM | POA: Diagnosis not present

## 2017-07-12 DIAGNOSIS — E1152 Type 2 diabetes mellitus with diabetic peripheral angiopathy with gangrene: Secondary | ICD-10-CM | POA: Diagnosis not present

## 2017-07-12 DIAGNOSIS — L97511 Non-pressure chronic ulcer of other part of right foot limited to breakdown of skin: Secondary | ICD-10-CM | POA: Diagnosis not present

## 2017-07-12 DIAGNOSIS — I96 Gangrene, not elsewhere classified: Secondary | ICD-10-CM | POA: Diagnosis not present

## 2017-07-12 DIAGNOSIS — Z9181 History of falling: Secondary | ICD-10-CM | POA: Diagnosis not present

## 2017-07-12 DIAGNOSIS — M79672 Pain in left foot: Secondary | ICD-10-CM | POA: Insufficient documentation

## 2017-07-12 DIAGNOSIS — I70245 Atherosclerosis of native arteries of left leg with ulceration of other part of foot: Secondary | ICD-10-CM | POA: Diagnosis not present

## 2017-07-12 DIAGNOSIS — E1151 Type 2 diabetes mellitus with diabetic peripheral angiopathy without gangrene: Secondary | ICD-10-CM | POA: Diagnosis not present

## 2017-07-12 DIAGNOSIS — Z792 Long term (current) use of antibiotics: Secondary | ICD-10-CM | POA: Diagnosis not present

## 2017-07-12 DIAGNOSIS — M25572 Pain in left ankle and joints of left foot: Secondary | ICD-10-CM | POA: Diagnosis not present

## 2017-07-12 DIAGNOSIS — Z79899 Other long term (current) drug therapy: Secondary | ICD-10-CM | POA: Insufficient documentation

## 2017-07-12 DIAGNOSIS — E11621 Type 2 diabetes mellitus with foot ulcer: Secondary | ICD-10-CM | POA: Diagnosis not present

## 2017-07-12 LAB — CBC WITH DIFFERENTIAL/PLATELET
BASOS ABS: 0.1 10*3/uL (ref 0.0–0.1)
Basophils Relative: 1 %
EOS PCT: 5 %
Eosinophils Absolute: 0.4 10*3/uL (ref 0.0–0.7)
HEMATOCRIT: 41.9 % (ref 36.0–46.0)
Hemoglobin: 13.7 g/dL (ref 12.0–15.0)
LYMPHS PCT: 20 %
Lymphs Abs: 1.9 10*3/uL (ref 0.7–4.0)
MCH: 28.2 pg (ref 26.0–34.0)
MCHC: 32.7 g/dL (ref 30.0–36.0)
MCV: 86.2 fL (ref 78.0–100.0)
MONO ABS: 0.5 10*3/uL (ref 0.1–1.0)
MONOS PCT: 5 %
NEUTROS ABS: 6.5 10*3/uL (ref 1.7–7.7)
Neutrophils Relative %: 69 %
PLATELETS: 215 10*3/uL (ref 150–400)
RBC: 4.86 MIL/uL (ref 3.87–5.11)
RDW: 13.7 % (ref 11.5–15.5)
WBC: 9.3 10*3/uL (ref 4.0–10.5)

## 2017-07-12 LAB — I-STAT CG4 LACTIC ACID, ED: Lactic Acid, Venous: 2.38 mmol/L (ref 0.5–1.9)

## 2017-07-12 LAB — COMPREHENSIVE METABOLIC PANEL
ALBUMIN: 4 g/dL (ref 3.5–5.0)
ALK PHOS: 76 U/L (ref 38–126)
ALT: 23 U/L (ref 14–54)
AST: 22 U/L (ref 15–41)
Anion gap: 11 (ref 5–15)
BILIRUBIN TOTAL: 0.7 mg/dL (ref 0.3–1.2)
BUN: 11 mg/dL (ref 6–20)
CALCIUM: 9.4 mg/dL (ref 8.9–10.3)
CO2: 23 mmol/L (ref 22–32)
Chloride: 100 mmol/L — ABNORMAL LOW (ref 101–111)
Creatinine, Ser: 0.83 mg/dL (ref 0.44–1.00)
GFR calc Af Amer: >60 mL/min (ref >60)
GFR calc non Af Amer: >60 mL/min (ref >60)
GLUCOSE: 263 mg/dL — AB (ref 65–99)
POTASSIUM: 4.4 mmol/L (ref 3.5–5.1)
SODIUM: 134 mmol/L — AB (ref 135–145)
TOTAL PROTEIN: 7.3 g/dL (ref 6.5–8.1)

## 2017-07-12 MED ORDER — LEVOFLOXACIN 750 MG PO TABS
750.0000 mg | ORAL_TABLET | Freq: Once | ORAL | Status: AC
  Administered 2017-07-12: 750 mg via ORAL
  Filled 2017-07-12: qty 1

## 2017-07-12 MED ORDER — OXYCODONE-ACETAMINOPHEN 5-325 MG PO TABS: 1.0000 | ORAL_TABLET | Freq: Four times a day (QID) | ORAL | 0 refills | Status: DC | PRN

## 2017-07-12 MED ORDER — LEVOFLOXACIN 500 MG PO TABS: 500.0000 mg | ORAL_TABLET | Freq: Every day | ORAL | 0 refills | Status: DC

## 2017-07-12 MED ORDER — OXYCODONE-ACETAMINOPHEN 5-325 MG PO TABS
2.0000 | ORAL_TABLET | Freq: Once | ORAL | Status: AC
  Administered 2017-07-12: 2 via ORAL
  Filled 2017-07-12: qty 2

## 2017-07-12 NOTE — ED Notes (Signed)
Informed triage RN Callie of lactic acid 2.38

## 2017-07-12 NOTE — ED Notes (Signed)
Pt provided with discharge instructions, non further questions or concerns regarding care and follow up appts. Signature pad not working at this time; unable to obtain signature.

## 2017-07-12 NOTE — ED Triage Notes (Signed)
Pt c/o left foot pain. Hx diabetes, has wounds to both feet that were wrapped this am.

## 2017-07-12 NOTE — ED Notes (Signed)
Pt's foot wounds wrapped in clean gauze, pt ambulatory out of ED accompanied by family.

## 2017-07-12 NOTE — ED Provider Notes (Signed)
[facility]-EMERGENCY DEPT Provider Note   CSN: [id number] Arrival date & time: 07/12/17  1535     History   Chief Complaint Chief Complaint  Patient presents with  . Foot Pain    HPI Danielle Buchanan is a 61 y.o. female.  Patient w hx pvd, dm, c/o pain to left foot. Notes chronic pain due to chronic ulcers, chr vascular insuff, and dry gangrene.  Pain is constant, dull, severe. Pt indicates takes neurontin and hydrocodone but requests stronger pain med.  Family member notes overall appearance/gangrene is similar (4th toe extending to mid foot, and tip of great toe), but that area of erythema seems a little bit bigger. No new malodor or purulent drainage. No fever or chills. Pt denies feeling sick or ill.    The history is provided by the patient.  Foot Pain  Pertinent negatives include no chest pain, no abdominal pain, no headaches and no shortness of breath.    Past Medical History:  Diagnosis Date  . Allergic rhinitis   . Depression   . Diabetes (HCC)   . Dyslipidemia   . HTN (hypertension)   . Insomnia   . OSA (obstructive sleep apnea)     Patient Active Problem List   Diagnosis Date Noted  . Cellulitis 06/10/2017  . Hyponatremia 06/10/2017  . Fever 06/10/2017  . Rash 05/24/2017  . Diabetes (HCC)   . HTN (hypertension)   . Allergic rhinitis   . Insomnia   . Depression     Past Surgical History:  Procedure Laterality Date  . ABDOMINAL AORTOGRAM W/LOWER EXTREMITY N/A 06/24/2017   Procedure: ABDOMINAL AORTOGRAM W/LOWER EXTREMITY;  Surgeon: Maeola Harman, MD;  Location: [facility] INVASIVE CV LAB;  Service: Cardiovascular;  Laterality: N/A;  . CARPAL TUNNEL RELEASE Right     OB History    No data available       Home Medications    Prior to Admission medications   Medication Sig Start Date End Date Taking? Authorizing Provider  amitriptyline (ELAVIL) 25 MG tablet Take 25 mg by mouth 2 (two) times daily.    [provider]  cetirizine (ZYRTEC) 10  MG tablet Take 10 mg by mouth daily.  01/30/16   [provider]  gabapentin (NEURONTIN) 300 MG capsule Take 300 mg by mouth See admin instructions. Take  once on day 1, then  twice on day 2, then  three times daily thereafter 05/28/17   [provider]  HYDROcodone-acetaminophen (NORCO/VICODIN) 5-325 MG tablet Take 1 tablet by mouth every 6 (six) hours as needed for pain. 06/07/17   [provider]  insulin NPH-regular Human (NOVOLIN 70/30) (70-30) 100 UNIT/ML injection 120 units with breakfast, and 90 units with the evening meal Patient taking differently: Inject 60-90 Units into the skin See admin instructions. 90 units with breakfast, and 120 units with the evening meal 05/24/17   [name], MD  losartan (COZAAR) 100 MG tablet Take 100 mg by mouth daily.    [provider]  mupirocin cream (BACTROBAN) 2 % Apply 1 application topically daily. 05/30/17   [provider]  pravastatin (PRAVACHOL) 20 MG tablet Take 20 mg by mouth daily.     [provider]  sertraline (ZOLOFT) 100 MG tablet Take 100 mg by mouth daily.     [provider]  sulfamethoxazole-trimethoprim (BACTRIM DS,SEPTRA DS) 800-160 MG tablet Take 1 tablet by mouth 2 (two) times daily. 06/11/17   Kathlen Mody, MD  traZODone (DESYREL) 50 MG tablet Take 50  mg by mouth at bedtime.    [provider]    Family History Family History  Problem Relation Age of Onset  . Heart attack Mother   . Alcohol abuse Mother   . Heart attack Father   . Alcohol abuse Father   . Heart attack Sister   . Heart attack Brother   . Hypercholesterolemia Sister   . Diabetes Neg Hx     Social History Social History  Substance Use Topics  . Smoking status: Former Smoker    Packs/day: 1.00    Years: 51.00    Types: Cigarettes  . Smokeless tobacco: Never Used  . Alcohol use No     Allergies   Doxycycline   Review of Systems Review of Systems    Constitutional: Negative for fever.  HENT: Negative for sore throat.   Eyes: Negative for redness.  Respiratory: Negative for shortness of breath.   Cardiovascular: Negative for chest pain.  Gastrointestinal: Negative for abdominal pain.  Genitourinary: Negative for flank pain.  Musculoskeletal: Negative for back pain.  Skin: Negative for rash.  Neurological: Negative for headaches.  Hematological: Does not bruise/bleed easily.  Psychiatric/Behavioral: Negative for confusion.     Physical Exam Updated Vital Signs BP (!) 158/102 (BP Location: Left Arm)   Pulse 81   Temp 98 F (36.7 C) (Oral)   Resp 18   Ht 1.549 m ( )   Wt 104.3 kg (230 lb)   SpO2 96%   BMI 43.46 kg/m   Physical Exam  Constitutional: She appears well-developed and well-nourished. No distress.  HENT:  Mouth/Throat: Oropharynx is clear and moist.  Eyes: Conjunctivae are normal. No scleral icterus.  Neck: Neck supple. No tracheal deviation present.  Cardiovascular: Normal rate, regular rhythm, normal heart sounds and intact distal pulses.   Pulmonary/Chest: Effort normal. No respiratory distress.  Abdominal: Normal appearance. She exhibits no distension.  Musculoskeletal: She exhibits no edema.  Chronic wounds to left foot, tip of great toe and left 4th toe with dry gangrenous changes extending to left midfoot. No purulent drainage. Around ulcerated areas is mild surrounding erythema, also extending just prox to midfoot. dp is palp bil. No lymphangitis/swelling/redness up leg.    Neurological: She is alert.  Skin: Skin is warm and dry. No rash noted. She is not diaphoretic.  Psychiatric: She has a normal mood and affect.  Nursing note and vitals reviewed.    ED Treatments / Results  Labs (all labs ordered are listed, but only abnormal results are displayed) Results for orders placed or performed during the hospital encounter of 07/12/17  Comprehensive metabolic panel  Result Value Ref Range    Sodium 134 (L) 135 - 145 mmol/L   Potassium 4.4 3.5 - 5.1 mmol/L   Chloride 100 (L) 101 - 111 mmol/L   CO2 23 22 - 32 mmol/L   Glucose, Bld 263 (H) 65 - 99 mg/dL   BUN 11 6 - 20 mg/dL   Creatinine, Ser 4.09 0.44 - 1.00 mg/dL   Calcium 9.4 8.9 - 81.1 mg/dL   Total Protein 7.3 6.5 - 8.1 g/dL   Albumin 4.0 3.5 - 5.0 g/dL   AST 22 15 - 41 U/L   ALT 23 14 - 54 U/L   Alkaline Phosphatase 76 38 - 126 U/L   Total Bilirubin 0.7 0.3 - 1.2 mg/dL   GFR calc non Af Amer >60 >60 mL/min   GFR calc Af Amer >60 >60 mL/min   Anion gap 11 5 -  15  CBC with Differential  Result Value Ref Range   WBC 9.3 4.0 - 10.5 K/uL   RBC 4.86 3.87 - 5.11 MIL/uL   Hemoglobin 13.7 12.0 - 15.0 g/dL   HCT 40.9 81.1 - 91.4 %   MCV 86.2 78.0 - 100.0 fL   MCH 28.2 26.0 - 34.0 pg   MCHC 32.7 30.0 - 36.0 g/dL   RDW 78.2 95.6 - 21.3 %   Platelets 215 150 - 400 K/uL   Neutrophils Relative % 69 %   Neutro Abs 6.5 1.7 - 7.7 K/uL   Lymphocytes Relative 20 %   Lymphs Abs 1.9 0.7 - 4.0 K/uL   Monocytes Relative 5 %   Monocytes Absolute 0.5 0.1 - 1.0 K/uL   Eosinophils Relative 5 %   Eosinophils Absolute 0.4 0.0 - 0.7 K/uL   Basophils Relative 1 %   Basophils Absolute 0.1 0.0 - 0.1 K/uL  I-Stat CG4 Lactic Acid, ED  Result Value Ref Range   Lactic Acid, Venous 2.38 (HH) 0.5 - 1.9 mmol/L   Comment NOTIFIED PHYSICIAN    EKG  EKG Interpretation None       Radiology No results found.  Procedures Procedures (including critical care time)  Medications Ordered in ED Medications  levofloxacin (LEVAQUIN) tablet 750 mg (not administered)  oxyCODONE-acetaminophen (PERCOCET/ROXICET) 5-325 MG per tablet 2 tablet (not administered)     Initial Impression / Assessment and Plan / ED Course  I have reviewed the triage vital signs and the nursing notes.  Pertinent labs & imaging results that were available during my care of the patient were reviewed by me and considered in my medical decision making (see chart for  details).  Labs sent from triage.  pts main c/o is pain in foot uncontrolled with home meds, has chr pain in foot.   Pt/fam indicate gangrenous/black area unchanged from prior however mild increase in erythema.  Pt indicates amputation has been recommended, however, they have decided to wait and see how things go.    Pt systemically does not feel sick or ill. No fevers. Hr 78, rr 14. No chills or sweats.   Will tx w abx, and pain control.  Pt has appt w vascular next week.   levaquin po. Percocet po (pt has ride, does not have to drive).    Final Clinical Impressions(s) / ED Diagnoses   Final diagnoses:  None    New Prescriptions New Prescriptions   No medications on file     Cathren Laine, MD 07/12/17 [phone]

## 2017-07-12 NOTE — Discharge Instructions (Signed)
It was our pleasure to provide your ER care today - we hope that you feel better.  Continue to keep areas very clean/dry.  Take antibiotic as prescribed.  Take motrin or aleve as need for pain. You may also take percocet as need for pain. No driving for the next 6 hours or when taking percocet. Also, do not take tylenol or acetaminophen containing medication, including your hydrocodone medication,  when taking percocet.   Follow up with your foot doctors as planned this coming week.  Return to ER if worse, new symptoms, spreading redness, high fevers, intractable pain, other concern.

## 2017-07-12 NOTE — ED Notes (Signed)
Pt reports having a fever this am, took "one tylenol tablet", temp 98.0 in triage.

## 2017-07-17 ENCOUNTER — Encounter (HOSPITAL_BASED_OUTPATIENT_CLINIC_OR_DEPARTMENT_OTHER): Payer: Medicare HMO | Attending: Surgery

## 2017-07-17 DIAGNOSIS — E1140 Type 2 diabetes mellitus with diabetic neuropathy, unspecified: Secondary | ICD-10-CM | POA: Diagnosis not present

## 2017-07-17 DIAGNOSIS — I70245 Atherosclerosis of native arteries of left leg with ulceration of other part of foot: Secondary | ICD-10-CM | POA: Insufficient documentation

## 2017-07-17 DIAGNOSIS — I10 Essential (primary) hypertension: Secondary | ICD-10-CM | POA: Diagnosis not present

## 2017-07-17 DIAGNOSIS — E11621 Type 2 diabetes mellitus with foot ulcer: Secondary | ICD-10-CM | POA: Diagnosis not present

## 2017-07-17 DIAGNOSIS — L97522 Non-pressure chronic ulcer of other part of left foot with fat layer exposed: Secondary | ICD-10-CM | POA: Diagnosis not present

## 2017-07-17 DIAGNOSIS — L97512 Non-pressure chronic ulcer of other part of right foot with fat layer exposed: Secondary | ICD-10-CM | POA: Insufficient documentation

## 2017-07-17 DIAGNOSIS — I70235 Atherosclerosis of native arteries of right leg with ulceration of other part of foot: Secondary | ICD-10-CM | POA: Insufficient documentation

## 2017-07-19 ENCOUNTER — Encounter: Payer: Self-pay | Admitting: *Deleted

## 2017-07-19 ENCOUNTER — Ambulatory Visit (INDEPENDENT_AMBULATORY_CARE_PROVIDER_SITE_OTHER): Payer: Medicare HMO | Admitting: Vascular Surgery

## 2017-07-19 ENCOUNTER — Encounter: Payer: Self-pay | Admitting: Vascular Surgery

## 2017-07-19 ENCOUNTER — Other Ambulatory Visit: Payer: Self-pay | Admitting: *Deleted

## 2017-07-19 VITALS — BP 136/85 | HR 74 | Temp 97.1°F | Resp 20 | Ht 61.0 in | Wt 234.2 lb

## 2017-07-19 DIAGNOSIS — I739 Peripheral vascular disease, unspecified: Secondary | ICD-10-CM

## 2017-07-19 NOTE — Progress Notes (Signed)
Patient ID: Danielle Buchanan, female   DOB: May 10, 1956, 61 y.o.   MRN: [id number]  Reason for Consult: New Patient (Initial Visit) (Appointment to discuss possible amputation.)   Referred by [name], MD  Subjective:     HPI:  Danielle Buchanan is a 61 y.o. female here for evaluation of her bilateral feet. She underwent angiogram recently is demonstrated a reconstructive disease. She has quit smoking frankly. She is being seen in the wound center and considered for hyperbaric. She is having significant pain in the left foot where she has progressive gangrene. She is not having any fevers and not on antibiotics at this time.  Past Medical History:  Diagnosis Date  . Allergic rhinitis   . Depression   . Diabetes (HCC)   . Dyslipidemia   . HTN (hypertension)   . Insomnia   . OSA (obstructive sleep apnea)    Family History  Problem Relation Age of Onset  . Heart attack Mother   . Alcohol abuse Mother   . Heart attack Father   . Alcohol abuse Father   . Heart attack Sister   . Heart attack Brother   . Hypercholesterolemia Sister   . Diabetes Neg Hx    Past Surgical History:  Procedure Laterality Date  . ABDOMINAL AORTOGRAM W/LOWER EXTREMITY N/A 06/24/2017   Procedure: ABDOMINAL AORTOGRAM W/LOWER EXTREMITY;  Surgeon: Maeola Harman, MD;  Location: [facility] INVASIVE CV LAB;  Service: Cardiovascular;  Laterality: N/A;  . CARPAL TUNNEL RELEASE Right     Short Social History:  Social History  Substance Use Topics  . Smoking status: Former Smoker    Packs/day: 1.00    Years: 51.00    Types: Cigarettes  . Smokeless tobacco: Never Used  . Alcohol use No    Allergies  Allergen Reactions  . Doxycycline Rash    Current Outpatient Prescriptions  Medication Sig Dispense Refill  . amitriptyline (ELAVIL) 25 MG tablet Take 25 mg by mouth 2 (two) times daily.    . cetirizine (ZYRTEC) 10 MG tablet Take 10 mg by mouth daily.     Marland Kitchen gabapentin (NEURONTIN) 300 MG capsule Take 300  mg by mouth See admin instructions. Take  once on day 1, then  twice on day 2, then  three times daily thereafter  0  . HYDROcodone-acetaminophen (NORCO/VICODIN) 5-325 MG tablet Take 1 tablet by mouth every 6 (six) hours as needed for pain.  0  . insulin NPH-regular Human (NOVOLIN 70/30) (70-30) 100 UNIT/ML injection 120 units with breakfast, and 90 units with the evening meal (Patient taking differently: Inject 60-90 Units into the skin See admin instructions. 90 units with breakfast, and 120 units with the evening meal) 70 mL 11  . levofloxacin (LEVAQUIN) 500 MG tablet Take 1 tablet (500 mg total) by mouth daily. 7 tablet 0  . losartan (COZAAR) 100 MG tablet Take 100 mg by mouth daily.    . mupirocin cream (BACTROBAN) 2 % Apply 1 application topically daily.  0  . oxyCODONE-acetaminophen (PERCOCET/ROXICET) 5-325 MG tablet Take 1-2 tablets by mouth every 6 (six) hours as needed for severe pain. 20 tablet 0  . pravastatin (PRAVACHOL) 20 MG tablet Take 20 mg by mouth daily.     . sertraline (ZOLOFT) 100 MG tablet Take 100 mg by mouth daily.     Marland Kitchen sulfamethoxazole-trimethoprim (BACTRIM DS,SEPTRA DS) 800-160 MG tablet Take 1 tablet by mouth 2 (two) times daily. 20 tablet 0  . traZODone (DESYREL) 50 MG tablet Take 50 mg  by mouth at bedtime.     No current facility-administered medications for this visit.     Review of Systems  Constitutional:  Constitutional negative. HENT: HENT negative.  Eyes: Eyes negative.  Respiratory: Respiratory negative.  Cardiovascular: Cardiovascular negative.  GI: Gastrointestinal negative.  Musculoskeletal: Positive for gait problem and leg pain.  Hematologic: Hematologic/lymphatic negative.  Psychiatric: Psychiatric negative.        Objective:  Objective   Vitals:   07/19/17 1216  BP: 136/85  Pulse: 74  Resp: 20  Temp: (!) 97.1 F (36.2 C)  TempSrc: Oral  SpO2: 92%  Weight: 234 lb 3.2 oz (106.2 kg)  Height:  (1.549 m)   Body  mass index is 44.25 kg/m.  Physical Exam  Constitutional: She is oriented to person, place, and time. She appears well-developed.  Eyes: Pupils are equal, round, and reactive to light.  Neck: Normal range of motion.  Cardiovascular:  Pulses:      Posterior tibial pulses are 1+ on the right side, and 1+ on the left side.  Pulmonary/Chest: Effort normal.  Abdominal: Soft. She exhibits no mass.  Musculoskeletal:  Right 4th and 5th toe discoloration Left foot all toes gangrenous changes onto dorsum of foot  Neurological: She is alert and oriented to person, place, and time.  Psychiatric: She has a normal mood and affect. Her behavior is normal. Judgment and thought content normal.    Data: Reviewed previous angiogram without reconstructable disease     Assessment/Plan:     61yo Female with non-reconstructable vascular disease that is microvascular in nature. She will need at least a transmetatarsal amputation on the left which will be complicated with wound healing issues and possibly require wound VAC and future surgeries. I also gave her the option of below-knee indication on the left and we can get this done next week. We will get her scheduled today and they will decide on transmetatarsal application versus BKA prior surgery. Regardless of the surgery she has we discussed possibly needing rehabilitation therapy afterwards. We will also need to watch her right foot closely issues had some progression of her wounds on that foot too. I congratulated her on smoking cessation.     Maeola Harman MD [facility]

## 2017-07-22 ENCOUNTER — Encounter (HOSPITAL_COMMUNITY): Payer: Self-pay | Admitting: *Deleted

## 2017-07-22 DIAGNOSIS — I70245 Atherosclerosis of native arteries of left leg with ulceration of other part of foot: Secondary | ICD-10-CM | POA: Diagnosis not present

## 2017-07-22 DIAGNOSIS — Z9181 History of falling: Secondary | ICD-10-CM | POA: Diagnosis not present

## 2017-07-22 DIAGNOSIS — F17218 Nicotine dependence, cigarettes, with other nicotine-induced disorders: Secondary | ICD-10-CM | POA: Diagnosis not present

## 2017-07-22 DIAGNOSIS — E11621 Type 2 diabetes mellitus with foot ulcer: Secondary | ICD-10-CM | POA: Diagnosis not present

## 2017-07-22 DIAGNOSIS — L97521 Non-pressure chronic ulcer of other part of left foot limited to breakdown of skin: Secondary | ICD-10-CM | POA: Diagnosis not present

## 2017-07-22 DIAGNOSIS — L97511 Non-pressure chronic ulcer of other part of right foot limited to breakdown of skin: Secondary | ICD-10-CM | POA: Diagnosis not present

## 2017-07-22 DIAGNOSIS — E1152 Type 2 diabetes mellitus with diabetic peripheral angiopathy with gangrene: Secondary | ICD-10-CM | POA: Diagnosis not present

## 2017-07-22 DIAGNOSIS — Z792 Long term (current) use of antibiotics: Secondary | ICD-10-CM | POA: Diagnosis not present

## 2017-07-22 NOTE — Progress Notes (Signed)
Anesthesia Chart Review:  Pt is a same day work up.   Pt is a 61 year old female scheduled for L transmetatarsal amputation vs BKA on 07/23/2017 with Lemar Livings, MD  - PCP is [name], MD  PMH includes:  HTN, DM, OSA, dyslipidemia. Former smoker (quit about a month ago).   Medications include: novolin 70/30, losartan, pravastatin  Labs will be obtained DOS.  - HbA1c was 11.6 on 06/10/17.   CXR 06/10/17:  1. Cardiomegaly without failure. 2. Low lung volumes.  EKG 06/24/17: NSR. LAD. T wave abnormality, consider lateral ischemia  Reviewed case with Dr. Krista Blue. If no active CV DOS, I anticipate pt can proceed as scheduled.   Rica Mast, FNP-BC [facility]/Anesthesiology Phone: [phone] 07/22/2017 4:25 PM

## 2017-07-22 NOTE — Progress Notes (Signed)
Spoke with Danielle Buchanan for pre-op call. Danielle Buchanan denies cardiac history, chest pain or sob. Danielle Buchanan is a type 2 diabetic, last A1C was 11.6 on 06/10/17. Danielle Buchanan states her fasting blood sugar is usually around 115. Danielle Buchanan instructed to take 70% of her Novolin 70/30 insulin this evening and none in the AM. Instructed Danielle Buchanan to check her blood sugar in the AM when she gets up and every 2 hours until she leaves for the hospital. If blood sugar is 70 or below, treat with 1/2 cup of clear juice (apple or cranberry) and recheck blood sugar 15 minutes after drinking juice. If blood sugar continues to be 70 or below, call the Short Stay department and ask to speak to a nurse.

## 2017-07-23 ENCOUNTER — Inpatient Hospital Stay (HOSPITAL_COMMUNITY)
Admission: RE | Admit: 2017-07-23 | Discharge: 2017-07-30 | DRG: 240 | Disposition: A | Discharge status: Skilled Nursing Facility | Payer: Medicare HMO | Source: Ambulatory Visit | Attending: Vascular Surgery | Admitting: Vascular Surgery

## 2017-07-23 ENCOUNTER — Inpatient Hospital Stay (HOSPITAL_COMMUNITY): Payer: Medicare HMO | Admitting: Emergency Medicine

## 2017-07-23 ENCOUNTER — Encounter (HOSPITAL_COMMUNITY)
Admission: RE | Disposition: A | Discharge status: Skilled Nursing Facility | Payer: Self-pay | Source: Ambulatory Visit | Attending: Vascular Surgery

## 2017-07-23 ENCOUNTER — Encounter (HOSPITAL_COMMUNITY): Payer: Self-pay | Admitting: *Deleted

## 2017-07-23 DIAGNOSIS — L97529 Non-pressure chronic ulcer of other part of left foot with unspecified severity: Secondary | ICD-10-CM | POA: Diagnosis present

## 2017-07-23 DIAGNOSIS — Z89512 Acquired absence of left leg below knee: Secondary | ICD-10-CM | POA: Diagnosis not present

## 2017-07-23 DIAGNOSIS — I739 Peripheral vascular disease, unspecified: Secondary | ICD-10-CM | POA: Diagnosis not present

## 2017-07-23 DIAGNOSIS — E1152 Type 2 diabetes mellitus with diabetic peripheral angiopathy with gangrene: Secondary | ICD-10-CM | POA: Diagnosis not present

## 2017-07-23 DIAGNOSIS — E669 Obesity, unspecified: Secondary | ICD-10-CM | POA: Diagnosis present

## 2017-07-23 DIAGNOSIS — G4700 Insomnia, unspecified: Secondary | ICD-10-CM | POA: Diagnosis present

## 2017-07-23 DIAGNOSIS — R2689 Other abnormalities of gait and mobility: Secondary | ICD-10-CM | POA: Diagnosis not present

## 2017-07-23 DIAGNOSIS — E11621 Type 2 diabetes mellitus with foot ulcer: Secondary | ICD-10-CM | POA: Diagnosis not present

## 2017-07-23 DIAGNOSIS — Z87891 Personal history of nicotine dependence: Secondary | ICD-10-CM

## 2017-07-23 DIAGNOSIS — G4733 Obstructive sleep apnea (adult) (pediatric): Secondary | ICD-10-CM | POA: Diagnosis present

## 2017-07-23 DIAGNOSIS — E119 Type 2 diabetes mellitus without complications: Secondary | ICD-10-CM | POA: Diagnosis not present

## 2017-07-23 DIAGNOSIS — Z6841 Body Mass Index (BMI) 40.0 and over, adult: Secondary | ICD-10-CM | POA: Diagnosis not present

## 2017-07-23 DIAGNOSIS — I70262 Atherosclerosis of native arteries of extremities with gangrene, left leg: Secondary | ICD-10-CM | POA: Diagnosis not present

## 2017-07-23 DIAGNOSIS — L97519 Non-pressure chronic ulcer of other part of right foot with unspecified severity: Secondary | ICD-10-CM | POA: Diagnosis present

## 2017-07-23 DIAGNOSIS — S88019A Complete traumatic amputation at knee level, unspecified lower leg, initial encounter: Secondary | ICD-10-CM | POA: Diagnosis not present

## 2017-07-23 DIAGNOSIS — I10 Essential (primary) hypertension: Secondary | ICD-10-CM | POA: Diagnosis not present

## 2017-07-23 DIAGNOSIS — F329 Major depressive disorder, single episode, unspecified: Secondary | ICD-10-CM | POA: Diagnosis not present

## 2017-07-23 DIAGNOSIS — S8990XA Unspecified injury of unspecified lower leg, initial encounter: Secondary | ICD-10-CM | POA: Diagnosis not present

## 2017-07-23 DIAGNOSIS — R278 Other lack of coordination: Secondary | ICD-10-CM | POA: Diagnosis not present

## 2017-07-23 DIAGNOSIS — E785 Hyperlipidemia, unspecified: Secondary | ICD-10-CM | POA: Diagnosis not present

## 2017-07-23 DIAGNOSIS — Z4789 Encounter for other orthopedic aftercare: Secondary | ICD-10-CM | POA: Diagnosis not present

## 2017-07-23 DIAGNOSIS — G8918 Other acute postprocedural pain: Secondary | ICD-10-CM | POA: Diagnosis not present

## 2017-07-23 DIAGNOSIS — M79672 Pain in left foot: Secondary | ICD-10-CM | POA: Diagnosis present

## 2017-07-23 DIAGNOSIS — M6281 Muscle weakness (generalized): Secondary | ICD-10-CM | POA: Diagnosis not present

## 2017-07-23 HISTORY — DX: Peripheral vascular disease, unspecified: I73.9

## 2017-07-23 HISTORY — DX: Unspecified urinary incontinence: R32

## 2017-07-23 HISTORY — PX: AMPUTATION: SHX166

## 2017-07-23 LAB — CREATININE, SERUM
CREATININE: 0.74 mg/dL (ref 0.44–1.00)
GFR calc Af Amer: >60 mL/min (ref >60)
GFR calc non Af Amer: >60 mL/min (ref >60)

## 2017-07-23 LAB — CBC
HCT: 37.7 % (ref 36.0–46.0)
Hemoglobin: 12.1 g/dL (ref 12.0–15.0)
MCH: 28 pg (ref 26.0–34.0)
MCHC: 32.1 g/dL (ref 30.0–36.0)
MCV: 87.3 fL (ref 78.0–100.0)
PLATELETS: 194 10*3/uL (ref 150–400)
RBC: 4.32 MIL/uL (ref 3.87–5.11)
RDW: 13.3 % (ref 11.5–15.5)
WBC: 11.6 10*3/uL — ABNORMAL HIGH (ref 4.0–10.5)

## 2017-07-23 LAB — GLUCOSE, CAPILLARY
GLUCOSE-CAPILLARY: 235 mg/dL — AB (ref 65–99)
GLUCOSE-CAPILLARY: 250 mg/dL — AB (ref 65–99)
Glucose-Capillary: 229 mg/dL — ABNORMAL HIGH (ref 65–99)

## 2017-07-23 SURGERY — AMPUTATION BELOW KNEE
Anesthesia: General | Site: Leg Lower | Laterality: Left

## 2017-07-23 MED ORDER — DEXTROSE 5 % IV SOLN
1.5000 g | INTRAVENOUS | Status: AC
  Administered 2017-07-23: 1.5 g via INTRAVENOUS
  Filled 2017-07-23: qty 1.5

## 2017-07-23 MED ORDER — 0.9 % SODIUM CHLORIDE (POUR BTL) OPTIME
TOPICAL | Status: DC | PRN
  Administered 2017-07-23: 1000 mL

## 2017-07-23 MED ORDER — GABAPENTIN 300 MG PO CAPS
300.0000 mg | ORAL_CAPSULE | Freq: Three times a day (TID) | ORAL | Status: DC | PRN
  Administered 2017-07-23 – 2017-07-29 (×7): 300 mg via ORAL
  Filled 2017-07-23 (×8): qty 1

## 2017-07-23 MED ORDER — SERTRALINE HCL 50 MG PO TABS
150.0000 mg | ORAL_TABLET | Freq: Every day | ORAL | Status: DC
  Administered 2017-07-23 – 2017-07-30 (×8): 150 mg via ORAL
  Filled 2017-07-23 (×8): qty 1

## 2017-07-23 MED ORDER — LABETALOL HCL 5 MG/ML IV SOLN
10.0000 mg | INTRAVENOUS | Status: DC | PRN
  Administered 2017-07-24 – 2017-07-25 (×2): 10 mg via INTRAVENOUS
  Filled 2017-07-23 (×2): qty 4

## 2017-07-23 MED ORDER — OXYCODONE HCL 5 MG PO TABS
5.0000 mg | ORAL_TABLET | Freq: Once | ORAL | Status: AC | PRN
  Administered 2017-07-23: 5 mg via ORAL

## 2017-07-23 MED ORDER — SUGAMMADEX SODIUM 200 MG/2ML IV SOLN
INTRAVENOUS | Status: DC | PRN
  Administered 2017-07-23: 200 mg via INTRAVENOUS

## 2017-07-23 MED ORDER — OXYCODONE HCL 5 MG/5ML PO SOLN: 5.0000 mg | Freq: Once | ORAL | Status: AC | PRN

## 2017-07-23 MED ORDER — METOCLOPRAMIDE HCL 5 MG/ML IJ SOLN
INTRAMUSCULAR | Status: DC | PRN
Start: 2017-07-23 — End: 2017-07-23
  Administered 2017-07-23: 5 mg via INTRAVENOUS

## 2017-07-23 MED ORDER — ONDANSETRON HCL 4 MG/2ML IJ SOLN
INTRAMUSCULAR | Status: AC
  Filled 2017-07-23: qty 2

## 2017-07-23 MED ORDER — FENTANYL CITRATE (PF) 100 MCG/2ML IJ SOLN
INTRAMUSCULAR | Status: DC | PRN
  Administered 2017-07-23: 50 ug via INTRAVENOUS
  Administered 2017-07-23: 100 ug via INTRAVENOUS

## 2017-07-23 MED ORDER — PHENOL 1.4 % MT LIQD: 1.0000 | OROMUCOSAL | Status: DC | PRN

## 2017-07-23 MED ORDER — MORPHINE SULFATE (PF) 4 MG/ML IV SOLN
INTRAVENOUS | Status: AC
  Filled 2017-07-23: qty 1

## 2017-07-23 MED ORDER — ALUM & MAG HYDROXIDE-SIMETH 200-200-20 MG/5ML PO SUSP: 15.0000 mL | ORAL | Status: DC | PRN

## 2017-07-23 MED ORDER — LACTATED RINGERS IV SOLN: INTRAVENOUS | Status: DC

## 2017-07-23 MED ORDER — ACETAMINOPHEN 10 MG/ML IV SOLN
1000.0000 mg | Freq: Once | INTRAVENOUS | Status: AC
  Administered 2017-07-23: 1000 mg via INTRAVENOUS

## 2017-07-23 MED ORDER — PROPOFOL 10 MG/ML IV BOLUS
INTRAVENOUS | Status: DC | PRN
  Administered 2017-07-23: 170 mg via INTRAVENOUS

## 2017-07-23 MED ORDER — METOPROLOL TARTRATE 5 MG/5ML IV SOLN: 2.0000 mg | INTRAVENOUS | Status: DC | PRN

## 2017-07-23 MED ORDER — ROCURONIUM BROMIDE 10 MG/ML (PF) SYRINGE
PREFILLED_SYRINGE | INTRAVENOUS | Status: DC | PRN
  Administered 2017-07-23: 50 mg via INTRAVENOUS

## 2017-07-23 MED ORDER — OXYCODONE-ACETAMINOPHEN 5-325 MG PO TABS
ORAL_TABLET | ORAL | Status: AC
  Filled 2017-07-23: qty 2

## 2017-07-23 MED ORDER — FENTANYL CITRATE (PF) 100 MCG/2ML IJ SOLN
25.0000 ug | INTRAMUSCULAR | Status: DC | PRN
  Administered 2017-07-23 (×3): 50 ug via INTRAVENOUS

## 2017-07-23 MED ORDER — HEPARIN SODIUM (PORCINE) 5000 UNIT/ML IJ SOLN
5000.0000 [IU] | Freq: Three times a day (TID) | INTRAMUSCULAR | Status: DC
  Administered 2017-07-23 – 2017-07-30 (×20): 5000 [IU] via SUBCUTANEOUS
  Filled 2017-07-23 (×19): qty 1

## 2017-07-23 MED ORDER — ONDANSETRON HCL 4 MG/2ML IJ SOLN
INTRAMUSCULAR | Status: DC | PRN
  Administered 2017-07-23: 4 mg via INTRAVENOUS

## 2017-07-23 MED ORDER — PHENYLEPHRINE 40 MCG/ML (10ML) SYRINGE FOR IV PUSH (FOR BLOOD PRESSURE SUPPORT)
PREFILLED_SYRINGE | INTRAVENOUS | Status: AC
  Filled 2017-07-23: qty 10

## 2017-07-23 MED ORDER — FENTANYL CITRATE (PF) 100 MCG/2ML IJ SOLN
INTRAMUSCULAR | Status: AC
  Filled 2017-07-23: qty 2

## 2017-07-23 MED ORDER — EPHEDRINE 5 MG/ML INJ
INTRAVENOUS | Status: AC
  Filled 2017-07-23: qty 10

## 2017-07-23 MED ORDER — SUGAMMADEX SODIUM 200 MG/2ML IV SOLN
INTRAVENOUS | Status: AC
  Filled 2017-07-23: qty 2

## 2017-07-23 MED ORDER — CHLORHEXIDINE GLUCONATE CLOTH 2 % EX PADS: 6.0000 | MEDICATED_PAD | Freq: Once | CUTANEOUS | Status: DC

## 2017-07-23 MED ORDER — LOSARTAN POTASSIUM 50 MG PO TABS
100.0000 mg | ORAL_TABLET | Freq: Every day | ORAL | Status: DC
  Administered 2017-07-23 – 2017-07-30 (×8): 100 mg via ORAL
  Filled 2017-07-23 (×8): qty 2

## 2017-07-23 MED ORDER — MIDAZOLAM HCL 2 MG/2ML IJ SOLN
INTRAMUSCULAR | Status: DC | PRN
  Administered 2017-07-23: 2 mg via INTRAVENOUS

## 2017-07-23 MED ORDER — ACETAMINOPHEN 10 MG/ML IV SOLN
INTRAVENOUS | Status: AC
Start: 2017-07-23 — End: 2017-07-24
  Filled 2017-07-23: qty 100

## 2017-07-23 MED ORDER — GUAIFENESIN-DM 100-10 MG/5ML PO SYRP: 15.0000 mL | ORAL_SOLUTION | ORAL | Status: DC | PRN

## 2017-07-23 MED ORDER — PHENYLEPHRINE 40 MCG/ML (10ML) SYRINGE FOR IV PUSH (FOR BLOOD PRESSURE SUPPORT)
PREFILLED_SYRINGE | INTRAVENOUS | Status: DC | PRN
  Administered 2017-07-23: 120 ug via INTRAVENOUS
  Administered 2017-07-23: 80 ug via INTRAVENOUS

## 2017-07-23 MED ORDER — SUCCINYLCHOLINE CHLORIDE 200 MG/10ML IV SOSY
PREFILLED_SYRINGE | INTRAVENOUS | Status: AC
  Filled 2017-07-23: qty 10

## 2017-07-23 MED ORDER — TRAZODONE HCL 50 MG PO TABS
50.0000 mg | ORAL_TABLET | Freq: Every day | ORAL | Status: DC
  Administered 2017-07-23 – 2017-07-29 (×7): 50 mg via ORAL
  Filled 2017-07-23 (×7): qty 1

## 2017-07-23 MED ORDER — EPHEDRINE SULFATE-NACL 50-0.9 MG/10ML-% IV SOSY
PREFILLED_SYRINGE | INTRAVENOUS | Status: DC | PRN
  Administered 2017-07-23: 15 mg via INTRAVENOUS

## 2017-07-23 MED ORDER — MORPHINE SULFATE (PF) 2 MG/ML IV SOLN
2.0000 mg | INTRAVENOUS | Status: DC | PRN
  Administered 2017-07-23 – 2017-07-24 (×5): 4 mg via INTRAVENOUS
  Administered 2017-07-24 – 2017-07-25 (×2): 2 mg via INTRAVENOUS
  Administered 2017-07-25 (×2): 4 mg via INTRAVENOUS
  Administered 2017-07-26 – 2017-07-27 (×2): 2 mg via INTRAVENOUS
  Administered 2017-07-28: 4 mg via INTRAVENOUS
  Administered 2017-07-28: 2 mg via INTRAVENOUS
  Filled 2017-07-23: qty 2
  Filled 2017-07-23 (×2): qty 1
  Filled 2017-07-23: qty 2.5
  Filled 2017-07-23: qty 1
  Filled 2017-07-23 (×2): qty 2
  Filled 2017-07-23: qty 1
  Filled 2017-07-23: qty 2
  Filled 2017-07-23: qty 1
  Filled 2017-07-23 (×2): qty 2

## 2017-07-23 MED ORDER — LIDOCAINE 2% (20 MG/ML) 5 ML SYRINGE
INTRAMUSCULAR | Status: DC | PRN
  Administered 2017-07-23: 40 mg via INTRAVENOUS

## 2017-07-23 MED ORDER — OXYCODONE HCL 5 MG PO TABS
ORAL_TABLET | ORAL | Status: AC
  Filled 2017-07-23: qty 1

## 2017-07-23 MED ORDER — SODIUM CHLORIDE 0.9 % IV SOLN: INTRAVENOUS | Status: DC

## 2017-07-23 MED ORDER — AMITRIPTYLINE HCL 50 MG PO TABS
25.0000 mg | ORAL_TABLET | Freq: Every day | ORAL | Status: DC
  Administered 2017-07-23 – 2017-07-29 (×7): 25 mg via ORAL
  Filled 2017-07-23 (×7): qty 1

## 2017-07-23 MED ORDER — INSULIN ASPART 100 UNIT/ML ~~LOC~~ SOLN
0.0000 [IU] | Freq: Three times a day (TID) | SUBCUTANEOUS | Status: DC
  Administered 2017-07-24: 15 [IU] via SUBCUTANEOUS
  Administered 2017-07-24: 11 [IU] via SUBCUTANEOUS
  Administered 2017-07-24: 20 [IU] via SUBCUTANEOUS
  Administered 2017-07-25: 11 [IU] via SUBCUTANEOUS

## 2017-07-23 MED ORDER — ONDANSETRON HCL 4 MG/2ML IJ SOLN: 4.0000 mg | Freq: Once | INTRAMUSCULAR | Status: DC | PRN

## 2017-07-23 MED ORDER — INSULIN ASPART 100 UNIT/ML ~~LOC~~ SOLN
0.0000 [IU] | Freq: Every day | SUBCUTANEOUS | Status: DC
Start: 2017-07-23 — End: 2017-07-25
  Administered 2017-07-23: 2 [IU] via SUBCUTANEOUS
  Administered 2017-07-25: 4 [IU] via SUBCUTANEOUS

## 2017-07-23 MED ORDER — POTASSIUM CHLORIDE CRYS ER 20 MEQ PO TBCR: 20.0000 meq | EXTENDED_RELEASE_TABLET | Freq: Once | ORAL | Status: DC

## 2017-07-23 MED ORDER — DEXAMETHASONE SODIUM PHOSPHATE 10 MG/ML IJ SOLN
INTRAMUSCULAR | Status: AC
  Filled 2017-07-23: qty 1

## 2017-07-23 MED ORDER — ONDANSETRON HCL 4 MG/2ML IJ SOLN: 4.0000 mg | Freq: Four times a day (QID) | INTRAMUSCULAR | Status: DC | PRN

## 2017-07-23 MED ORDER — LIDOCAINE 2% (20 MG/ML) 5 ML SYRINGE
INTRAMUSCULAR | Status: AC
  Filled 2017-07-23: qty 5

## 2017-07-23 MED ORDER — POTASSIUM CHLORIDE IN NACL 20-0.9 MEQ/L-% IV SOLN
INTRAVENOUS | Status: DC
  Administered 2017-07-23: 22:00:00 via INTRAVENOUS
  Filled 2017-07-23: qty 1000

## 2017-07-23 MED ORDER — LACTATED RINGERS IV SOLN
INTRAVENOUS | Status: DC
  Administered 2017-07-23: 08:00:00 via INTRAVENOUS

## 2017-07-23 MED ORDER — PRAVASTATIN SODIUM 20 MG PO TABS
20.0000 mg | ORAL_TABLET | Freq: Every day | ORAL | Status: DC
  Administered 2017-07-23 – 2017-07-29 (×7): 20 mg via ORAL
  Filled 2017-07-23 (×7): qty 1

## 2017-07-23 MED ORDER — FENTANYL CITRATE (PF) 100 MCG/2ML IJ SOLN
INTRAMUSCULAR | Status: AC
  Administered 2017-07-23: 100 ug
  Filled 2017-07-23: qty 2

## 2017-07-23 MED ORDER — MIDAZOLAM HCL 2 MG/2ML IJ SOLN
INTRAMUSCULAR | Status: AC
  Administered 2017-07-23: 2 mg
  Filled 2017-07-23: qty 2

## 2017-07-23 MED ORDER — PREGABALIN 50 MG PO CAPS
75.0000 mg | ORAL_CAPSULE | Freq: Two times a day (BID) | ORAL | Status: DC
  Administered 2017-07-23 – 2017-07-30 (×14): 75 mg via ORAL
  Filled 2017-07-23 (×15): qty 1

## 2017-07-23 MED ORDER — ROCURONIUM BROMIDE 50 MG/5ML IV SOLN
INTRAVENOUS | Status: AC
  Filled 2017-07-23: qty 1

## 2017-07-23 MED ORDER — METOCLOPRAMIDE HCL 5 MG/ML IJ SOLN
INTRAMUSCULAR | Status: AC
  Filled 2017-07-23: qty 2

## 2017-07-23 MED ORDER — FENTANYL CITRATE (PF) 250 MCG/5ML IJ SOLN
INTRAMUSCULAR | Status: AC
  Filled 2017-07-23: qty 5

## 2017-07-23 MED ORDER — HYDRALAZINE HCL 20 MG/ML IJ SOLN
5.0000 mg | INTRAMUSCULAR | Status: DC | PRN
  Administered 2017-07-24: 5 mg via INTRAVENOUS

## 2017-07-23 MED ORDER — PROPOFOL 10 MG/ML IV BOLUS
INTRAVENOUS | Status: AC
  Filled 2017-07-23: qty 20

## 2017-07-23 MED ORDER — MIDAZOLAM HCL 2 MG/2ML IJ SOLN
INTRAMUSCULAR | Status: AC
  Filled 2017-07-23: qty 2

## 2017-07-23 MED ORDER — LORATADINE 10 MG PO TABS
10.0000 mg | ORAL_TABLET | Freq: Every day | ORAL | Status: DC
  Administered 2017-07-23 – 2017-07-30 (×8): 10 mg via ORAL
  Filled 2017-07-23 (×8): qty 1

## 2017-07-23 MED ORDER — PANTOPRAZOLE SODIUM 40 MG PO TBEC
40.0000 mg | DELAYED_RELEASE_TABLET | Freq: Every day | ORAL | Status: DC
  Administered 2017-07-23 – 2017-07-30 (×8): 40 mg via ORAL
  Filled 2017-07-23 (×8): qty 1

## 2017-07-23 MED ORDER — OXYCODONE-ACETAMINOPHEN 5-325 MG PO TABS
1.0000 | ORAL_TABLET | ORAL | Status: DC | PRN
  Administered 2017-07-23 – 2017-07-28 (×17): 2 via ORAL
  Administered 2017-07-29: 1 via ORAL
  Administered 2017-07-29: 2 via ORAL
  Administered 2017-07-29: 1 via ORAL
  Administered 2017-07-29: 2 via ORAL
  Administered 2017-07-29: 1 via ORAL
  Administered 2017-07-30: 2 via ORAL
  Administered 2017-07-30: 1 via ORAL
  Filled 2017-07-23 (×8): qty 2
  Filled 2017-07-23: qty 1
  Filled 2017-07-23 (×2): qty 2
  Filled 2017-07-23: qty 1
  Filled 2017-07-23: qty 2
  Filled 2017-07-23: qty 1
  Filled 2017-07-23 (×10): qty 2

## 2017-07-23 SURGICAL SUPPLY — 57 items
BANDAGE ACE 4X5 VEL STRL LF (GAUZE/BANDAGES/DRESSINGS) IMPLANT
BANDAGE ACE 6X5 VEL STRL LF (GAUZE/BANDAGES/DRESSINGS) IMPLANT
BANDAGE ELASTIC 4 VELCRO ST LF (GAUZE/BANDAGES/DRESSINGS) ×3 IMPLANT
BANDAGE ESMARK 6X9 LF (GAUZE/BANDAGES/DRESSINGS) ×1 IMPLANT
BLADE SAW GIGLI 510 (BLADE) ×2 IMPLANT
BLADE SAW GIGLI 510MM (BLADE) ×1
BNDG COHESIVE 6X5 TAN STRL LF (GAUZE/BANDAGES/DRESSINGS) ×3 IMPLANT
BNDG ESMARK 6X9 LF (GAUZE/BANDAGES/DRESSINGS) ×3
BNDG GAUZE ELAST 4 BULKY (GAUZE/BANDAGES/DRESSINGS) ×3 IMPLANT
CANISTER SUCT 3000ML PPV (MISCELLANEOUS) ×3 IMPLANT
CLIP VESOCCLUDE MED 6/CT (CLIP) IMPLANT
COVER SURGICAL LIGHT HANDLE (MISCELLANEOUS) ×3 IMPLANT
CUFF TOURNIQUET SINGLE 34IN LL (TOURNIQUET CUFF) ×3 IMPLANT
CUFF TOURNIQUET SINGLE 44IN (TOURNIQUET CUFF) IMPLANT
DRAIN CHANNEL 19F RND (DRAIN) IMPLANT
DRAPE HALF SHEET 40X57 (DRAPES) ×3 IMPLANT
DRAPE ORTHO SPLIT 77X108 STRL (DRAPES) ×4
DRAPE SURG ORHT 6 SPLT 77X108 (DRAPES) ×2 IMPLANT
DRSG ADAPTIC 3X8 NADH LF (GAUZE/BANDAGES/DRESSINGS) ×3 IMPLANT
ELECT REM PT RETURN 9FT ADLT (ELECTROSURGICAL) ×3
ELECTRODE REM PT RTRN 9FT ADLT (ELECTROSURGICAL) ×1 IMPLANT
EVACUATOR SILICONE 100CC (DRAIN) IMPLANT
GAUZE SPONGE 4X4 12PLY STRL (GAUZE/BANDAGES/DRESSINGS) ×3 IMPLANT
GAUZE SPONGE 4X4 12PLY STRL LF (GAUZE/BANDAGES/DRESSINGS) ×3 IMPLANT
GLOVE BIO SURGEON STRL SZ7.5 (GLOVE) ×3 IMPLANT
GLOVE BIOGEL PI IND STRL 6.5 (GLOVE) ×1 IMPLANT
GLOVE BIOGEL PI IND STRL 7.0 (GLOVE) ×1 IMPLANT
GLOVE BIOGEL PI IND STRL 8.5 (GLOVE) ×1 IMPLANT
GLOVE BIOGEL PI INDICATOR 6.5 (GLOVE) ×2
GLOVE BIOGEL PI INDICATOR 7.0 (GLOVE) ×2
GLOVE BIOGEL PI INDICATOR 8.5 (GLOVE) ×2
GLOVE SS BIOGEL STRL SZ 8 (GLOVE) ×1 IMPLANT
GLOVE SUPERSENSE BIOGEL SZ 8 (GLOVE) ×2
GLOVE SURG SS PI 6.5 STRL IVOR (GLOVE) ×3 IMPLANT
GOWN STRL REUS W/ TWL LRG LVL3 (GOWN DISPOSABLE) ×1 IMPLANT
GOWN STRL REUS W/ TWL XL LVL3 (GOWN DISPOSABLE) ×2 IMPLANT
GOWN STRL REUS W/TWL LRG LVL3 (GOWN DISPOSABLE) ×2
GOWN STRL REUS W/TWL XL LVL3 (GOWN DISPOSABLE) ×4
KIT BASIN OR (CUSTOM PROCEDURE TRAY) ×3 IMPLANT
KIT ROOM TURNOVER OR (KITS) ×3 IMPLANT
NS IRRIG 1000ML POUR BTL (IV SOLUTION) ×3 IMPLANT
PACK GENERAL/GYN (CUSTOM PROCEDURE TRAY) ×3 IMPLANT
PAD ARMBOARD 7.5X6 YLW CONV (MISCELLANEOUS) ×6 IMPLANT
STAPLER VISISTAT 35W (STAPLE) ×3 IMPLANT
STOCKINETTE IMPERVIOUS LG (DRAPES) ×3 IMPLANT
SUT BONE WAX W31G (SUTURE) IMPLANT
SUT ETHILON 3 0 PS 1 (SUTURE) IMPLANT
SUT SILK 0 TIES 10X30 (SUTURE) ×3 IMPLANT
SUT SILK 2 0 (SUTURE) ×2
SUT SILK 2 0 SH CR/8 (SUTURE) ×3 IMPLANT
SUT SILK 2-0 18XBRD TIE 12 (SUTURE) ×1 IMPLANT
SUT SILK 3 0 (SUTURE)
SUT SILK 3-0 18XBRD TIE 12 (SUTURE) IMPLANT
SUT VIC AB 2-0 CT1 18 (SUTURE) ×9 IMPLANT
TOWEL GREEN STERILE (TOWEL DISPOSABLE) ×3 IMPLANT
UNDERPAD 30X30 (UNDERPADS AND DIAPERS) ×3 IMPLANT
WATER STERILE IRR 1000ML POUR (IV SOLUTION) ×3 IMPLANT

## 2017-07-23 NOTE — Anesthesia Procedure Notes (Signed)
Anesthesia Regional Block: Popliteal block   Pre-Anesthetic Checklist: ,, timeout performed, Correct Patient, Correct Site, Correct Laterality, Correct Procedure, Correct Position, site marked, Risks and benefits discussed,  Surgical consent,  Pre-op evaluation,  At surgeon's request and post-op pain management  Laterality: Left  Prep: chloraprep       Needles:   Needle Type: Echogenic Stimulator Needle          Additional Needles:   Procedures:,,,, ultrasound used (permanent image in chart),,,,  Narrative:  Start time: 07/23/2017 9:25 AM End time: 07/23/2017 9:30 AM Injection made incrementally with aspirations every 5 mL.  Performed by: Personally  Anesthesiologist: Hartford Maulden  Additional Notes: 20 cc 0.75% Ropivacaine injected for popliteal block  8 cc 0.75% Ropivacaine injected over proximal medial tibia for saphenous block

## 2017-07-23 NOTE — Anesthesia Procedure Notes (Signed)
Procedure Name: Intubation Date/Time: 07/23/2017 9:51 AM Performed by: Barrington Ellison Pre-anesthesia Checklist: Patient identified, Emergency Drugs available, Suction available and Patient being monitored Patient Re-evaluated:Patient Re-evaluated prior to induction Oxygen Delivery Method: Circle System Utilized Preoxygenation: Pre-oxygenation with 100% oxygen Induction Type: IV induction Ventilation: Mask ventilation without difficulty and Oral airway inserted - appropriate to patient size Laryngoscope Size: Mac and 3 Grade View: Grade I Tube type: Oral Tube size: 7.0 mm Number of attempts: 1 Airway Equipment and Method: Stylet and Oral airway Placement Confirmation: ETT inserted through vocal cords under direct vision,  positive ETCO2 and breath sounds checked- equal and bilateral Secured at: 22 cm Tube secured with: Tape Dental Injury: Teeth and Oropharynx as per pre-operative assessment

## 2017-07-23 NOTE — Anesthesia Preprocedure Evaluation (Addendum)
Anesthesia Evaluation  Patient identified by MRN, date of birth, ID band Patient awake    Reviewed: Allergy & Precautions, NPO status , Patient's Chart, lab work & pertinent test results  Airway Mallampati: II  TM Distance: >3 FB     Dental  (+) Teeth Intact, Dental Advisory Given   Pulmonary former smoker,    breath sounds clear to auscultation       Cardiovascular hypertension,  Rhythm:Regular Rate:Normal     Neuro/Psych    GI/Hepatic   Endo/Other  diabetes  Renal/GU      Musculoskeletal   Abdominal (+) + obese,   Peds  Hematology   Anesthesia Other Findings   Reproductive/Obstetrics                            Anesthesia Physical Anesthesia Plan  ASA: III  Anesthesia Plan: General   Post-op Pain Management:    Induction: Intravenous  PONV Risk Score and Plan: Ondansetron and Metaclopromide  Airway Management Planned: Oral ETT  Additional Equipment:   Intra-op Plan:   Post-operative Plan: Extubation in OR  Informed Consent: I have reviewed the patients History and Physical, chart, labs and discussed the procedure including the risks, benefits and alternatives for the proposed anesthesia with the patient or authorized representative who has indicated his/her understanding and acceptance.   Dental advisory given  Plan Discussed with: CRNA and Anesthesiologist  Anesthesia Plan Comments:         Anesthesia Quick Evaluation

## 2017-07-23 NOTE — Transfer of Care (Signed)
Immediate Anesthesia Transfer of Care Note  Patient: Danielle Buchanan  Procedure(s) Performed: AMPUTATION  BELOW KNEE (Left Leg Lower)  Patient Location: PACU  Anesthesia Type:General  Level of Consciousness: drowsy and patient cooperative  Airway & Oxygen Therapy: Patient Spontanous Breathing and Patient connected to nasal cannula oxygen  Post-op Assessment: Report given to RN  Post vital signs: Reviewed and stable  Last Vitals:  Vitals:   07/23/17 0753 07/23/17 0924  BP: (!) 165/73 (!) 126/59  Pulse: 72 71  Resp: 18 20  Temp: 36.8 C   SpO2: 95% 99%    Last Pain:  Vitals:   07/23/17 0753  TempSrc: Oral  PainSc:       Patients Stated Pain Goal: 3 (07/23/17 0735)  Complications: No apparent anesthesia complications

## 2017-07-23 NOTE — Anesthesia Postprocedure Evaluation (Signed)
Anesthesia Post Note  Patient: Danielle Buchanan  Procedure(s) Performed: AMPUTATION  BELOW KNEE (Left Leg Lower)     Patient location during evaluation: PACU Anesthesia Type: General Level of consciousness: awake, awake and alert and oriented Pain management: pain level controlled Vital Signs Assessment: post-procedure vital signs reviewed and stable Respiratory status: spontaneous breathing, nonlabored ventilation and respiratory function stable Cardiovascular status: blood pressure returned to baseline Postop Assessment: no headache Anesthetic complications: no    Last Vitals:  Vitals:   07/23/17 1833 07/23/17 1845  BP: (!) 155/76 (!) 142/75  Pulse: 69 70  Resp: 13 15  Temp: (!) 36.1 C   SpO2: 95% 95%    Last Pain:  Vitals:   07/23/17 1845  TempSrc:   PainSc: 8                  Charde Macfarlane COKER

## 2017-07-23 NOTE — H&P (Signed)
   History and Physical Update  The patient was interviewed and re-examined.  The patient's previous History and Physical has been reviewed and is unchanged from recent office visit. Plan for left side below knee amputation.   Cosby Proby C. Randie Heinz, MD [facility] [place] Office: [phone] Pager: [phone]   07/23/2017, 9:00 AM

## 2017-07-23 NOTE — Op Note (Signed)
    Patient name: Danielle Buchanan MRN: [id number] DOB: 1956/02/13 Sex: female  07/23/2017 Pre-operative Diagnosis: non healing left foot ulceration Post-operative diagnosis:  Same Surgeon:  Apolinar Junes C. Randie Heinz, MD Assistant: Debbora Presto Procedure Performed: Left sided bka  Indications:  61yo WF with non healing left foot ulceration indicated for below knee amputation.  Procedure:  The patient was identified in the holding area and taken to the operating room where geta was induced, antibiotic administered and she was prepped and draped in her left leg. The bka site was marked with a 2/3 and 1/3 approach. The leg was exsanguinated and tourniquet inflated. Total time 5 minutes. The incision was traced with 10 blade and deepened with cautery. The tibia was transected with giglia and fibula clipped. A posterior flap was created and the tourniquet was allowed down. All vessel were clamped and either tied and suture ligated. The nerve was divided on tension. The wound was irrigated, muscle debulked and closed with interrupted 2-0 vicryl sutures. The skin was closed with staples. All counts were correct at completion.   EBL 150cc    Lova Urbieta C. Randie Heinz, MD [facility] Office: [phone] Pager: [phone]

## 2017-07-24 ENCOUNTER — Ambulatory Visit: Payer: Medicare HMO | Admitting: Endocrinology

## 2017-07-24 ENCOUNTER — Encounter (HOSPITAL_COMMUNITY): Payer: Self-pay | Admitting: Vascular Surgery

## 2017-07-24 LAB — CBC
HCT: 37 % (ref 36.0–46.0)
HEMOGLOBIN: 12.1 g/dL (ref 12.0–15.0)
MCH: 28.4 pg (ref 26.0–34.0)
MCHC: 32.7 g/dL (ref 30.0–36.0)
MCV: 86.9 fL (ref 78.0–100.0)
Platelets: 202 10*3/uL (ref 150–400)
RBC: 4.26 MIL/uL (ref 3.87–5.11)
RDW: 13.8 % (ref 11.5–15.5)
WBC: 10.8 10*3/uL — ABNORMAL HIGH (ref 4.0–10.5)

## 2017-07-24 LAB — COMPREHENSIVE METABOLIC PANEL
ALK PHOS: 67 U/L (ref 38–126)
ALT: 19 U/L (ref 14–54)
AST: 29 U/L (ref 15–41)
Albumin: 3.1 g/dL — ABNORMAL LOW (ref 3.5–5.0)
Anion gap: 11 (ref 5–15)
BUN: 10 mg/dL (ref 6–20)
CALCIUM: 8.6 mg/dL — AB (ref 8.9–10.3)
CO2: 23 mmol/L (ref 22–32)
CREATININE: 0.73 mg/dL (ref 0.44–1.00)
Chloride: 99 mmol/L — ABNORMAL LOW (ref 101–111)
GFR calc non Af Amer: >60 mL/min (ref >60)
GLUCOSE: 348 mg/dL — AB (ref 65–99)
Potassium: 4.9 mmol/L (ref 3.5–5.1)
SODIUM: 133 mmol/L — AB (ref 135–145)
Total Bilirubin: 0.7 mg/dL (ref 0.3–1.2)
Total Protein: 6.5 g/dL (ref 6.5–8.1)

## 2017-07-24 LAB — GLUCOSE, CAPILLARY
GLUCOSE-CAPILLARY: 264 mg/dL — AB (ref 65–99)
GLUCOSE-CAPILLARY: 416 mg/dL — AB (ref 65–99)
GLUCOSE-CAPILLARY: 407 mg/dL — AB (ref 65–99)
Glucose-Capillary: 329 mg/dL — ABNORMAL HIGH (ref 65–99)
Glucose-Capillary: 372 mg/dL — ABNORMAL HIGH (ref 65–99)

## 2017-07-24 MED ORDER — COLLAGENASE 250 UNIT/GM EX OINT
TOPICAL_OINTMENT | Freq: Every day | CUTANEOUS | Status: DC
  Administered 2017-07-24 – 2017-07-30 (×7): via TOPICAL
  Filled 2017-07-24 (×4): qty 30

## 2017-07-24 NOTE — Progress Notes (Addendum)
Inpatient Diabetes Program Recommendations  AACE/ADA: New Consensus Statement on Inpatient Glycemic Control (2015)  Target Ranges:  Prepandial:   less than 140 mg/dL      Peak postprandial:   less than 180 mg/dL (1-2 hours)      Critically ill patients:  140 - 180 mg/dL   Results for Danielle Buchanan, Markeria (MRN [id number]) as of 07/24/2017 08:53  Ref. Range 07/23/2017 08:00 07/23/2017 10:51 07/23/2017 20:57 07/24/2017 05:43  Glucose-Capillary Latest Ref Range: 65 - 99 mg/dL 119229 (H) 147235 (H) 829250 (H) 329 (H)   Review of Glycemic Control  Diabetes history: DM 2 (Notes from Dr. Everardo AllEllison, Endocrinologist outpatient for DM management) Outpatient Diabetes medications: 70/30 90 units BID ( basal equivalent 126 units, short acting equivalent 54 units) Current orders for Inpatient glycemic control: Novolog Resistant Correction 0-20 units tid + Novolog HS scale 0-5 units  A1c 11.6%  Inpatient Diabetes Program Recommendations:    Patient has regular diet ordered. Glucose elevated in the 200-300 range. Patient also has basal insulin component with home 70/30 insulin. Please consider Changing diet to carb modified diet and add back a portion of patient's 70/30 insulin 45 units BID (Half home dose).  Thanks,  Christena DeemShannon Carley Glendenning RN, MSN, [facility] Inpatient Diabetes Coordinator Team Pager [phone] (8a-5p)

## 2017-07-24 NOTE — Progress Notes (Signed)
  Progress Note    07/24/2017 8:25 AM 1 Day Post-Op  Subjective: no overnight issues  Vitals:   07/23/17 2059 07/24/17 0514  BP: (!) 147/71 (!) 141/83  Pulse:    Resp:  18  Temp: 98.7 F (37.1 C) 98.7 F (37.1 C)  SpO2: 91% 95%    Physical Exam: aaox3 Left bka dressing cdi Right foot dressing cdi  CBC    Component Value Date/Time   WBC 10.8 (H) 07/24/2017 0250   RBC 4.26 07/24/2017 0250   HGB 12.1 07/24/2017 0250   HCT 37.0 07/24/2017 0250   PLT 202 07/24/2017 0250   MCV 86.9 07/24/2017 0250   MCH 28.4 07/24/2017 0250   MCHC 32.7 07/24/2017 0250   RDW 13.8 07/24/2017 0250   LYMPHSABS 1.9 07/12/2017 1605   MONOABS 0.5 07/12/2017 1605   EOSABS 0.4 07/12/2017 1605   BASOSABS 0.1 07/12/2017 1605    BMET    Component Value Date/Time   NA 133 (L) 07/24/2017 0250   K 4.9 07/24/2017 0250   CL 99 (L) 07/24/2017 0250   CO2 23 07/24/2017 0250   GLUCOSE 348 (H) 07/24/2017 0250   BUN 10 07/24/2017 0250   CREATININE 0.73 07/24/2017 0250   CALCIUM 8.6 (L) 07/24/2017 0250   GFRNONAA >60 07/24/2017 0250   GFRAA >60 07/24/2017 0250    INR    Component Value Date/Time   INR 0.92 06/09/2017 2105     Intake/Output Summary (Last 24 hours) at 07/24/17 0825 Last data filed at 07/24/17 0351  Gross per 24 hour  Intake             2515 ml  Output              500 ml  Net             2015 ml     Assessment:  61 y.o. female is s/p left bka  Plan: Pt consult subq heparin Santyl to right foot Left bka dressing down tomorrow   Apolinar JunesBrandon C. Randie Heinzain, MD [facility] [place] Office: [phone] Pager: [phone]  07/24/2017 8:25 AM

## 2017-07-24 NOTE — Evaluation (Signed)
Physical Therapy Evaluation Patient Details Name: Danielle Buchanan ClassJanice Yellowhair MRN: [id number] DOB: 05/26/1956 Today's Date: 07/24/2017   History of Present Illness  Patient is a 61 yo female admitted 07/23/17 with pain in Lt foot.  Patient with non-healing wounds, now s/p Lt BKA on 07/23/17.    PMH:  PVK, KM, chronic foot ulcers, depression, HTN, HLD, OSA  Clinical Impression  Patient presents with problems listed below.  Will benefit from acute PT to maximize functional mobility prior to discharge.  Patient requiring significant assistance for mobility.  Recommend Inpatient Rehab consult with goal to optimize functional independence to facilitate safe d/c home with sister.    Follow Up Recommendations CIR;Supervision for mobility/OOB    Equipment Recommendations  3in1 (PT);Wheelchair (measurements PT);Wheelchair cushion (measurements PT)    Recommendations for Other Services Rehab consult;OT consult     Precautions / Restrictions Precautions Precautions: Fall Precaution Comments: New Lt BKA Restrictions Weight Bearing Restrictions: No      Mobility  Bed Mobility Overal bed mobility: Needs Assistance Bed Mobility: Rolling;Sidelying to Sit;Sit to Sidelying Rolling: Mod assist Sidelying to sit: Mod assist     Sit to sidelying: Mod assist General bed mobility comments: Patient requiring mod assist to roll to both sides to don underpants.  Attempted to have patient reach for rails and use RLE to assist.  Patient calls for sister to pull her over using hands.  To move to sitting, patient again reaching for sister to pull her up.  Verbal cues for proper technique.  Patient was able to sit EOB x 10 minutes.  Pain increasing and requested to return to supine.  Transfers                 General transfer comment: NT  Ambulation/Gait                Stairs            Wheelchair Mobility    Modified Rankin (Stroke Patients Only)       Balance Overall balance assessment:  Needs assistance Sitting-balance support: Single extremity supported Sitting balance-Leahy Scale: Poor Sitting balance - Comments: Uses UE to maintain sitting balance.  Patient with posterior lean Postural control: Posterior lean                                   Pertinent Vitals/Pain Pain Assessment: 0-10 Pain Score: 8  Pain Location: LLE Pain Descriptors / Indicators: Grimacing;Guarding;Sharp;Shooting;Tender Pain Intervention(s): Limited activity within patient's tolerance;Monitored during session;Repositioned;Patient requesting pain meds-RN notified    Home Living Family/patient expects to be discharged to:: Private residence Living Arrangements: Other relatives (Sister) Available Help at Discharge: Family;Available 24 hours/day Type of Home: Apartment Home Access: Level entry     Home Layout: One level Home Equipment: Walker - 4 wheels      Prior Function Level of Independence: Needs assistance   Gait / Transfers Assistance Needed: Uses Rollator for mobility. Reports she sometimes needs help with mobility from sister.   ADL's / Homemaking Assistance Needed: Patient reports she does her own bathing/dressing.  Prior documentation reports sister assists.        Hand Dominance   Dominant Hand: Right    Extremity/Trunk Assessment   Upper Extremity Assessment Upper Extremity Assessment: Generalized weakness    Lower Extremity Assessment Lower Extremity Assessment: LLE deficits/detail;RLE deficits/detail RLE Sensation: history of peripheral neuropathy;decreased light touch LLE Deficits / Details: New BKA.  Able to  lift LLE against gravity.  Slow to extend knee in sitting. LLE: Unable to fully assess due to pain       Communication   Communication: No difficulties  Cognition Arousal/Alertness: Awake/alert Behavior During Therapy: Anxious;Flat affect Overall Cognitive Status: Within Functional Limits for tasks assessed                                         General Comments      Exercises Amputee Exercises Quad Sets: AROM;Left;5 reps;Supine Knee Extension: AROM;Left;Seated (Able to perform only 2 reps very slowly)   Assessment/Plan    PT Assessment Patient needs continued PT services  PT Problem List Decreased strength;Decreased activity tolerance;Decreased balance;Decreased mobility;Decreased knowledge of use of DME;Decreased knowledge of precautions;Impaired sensation;Obesity;Decreased skin integrity;Pain       PT Treatment Interventions DME instruction;Gait training;Functional mobility training;Therapeutic activities;Therapeutic exercise;Balance training;Patient/family education    PT Goals (Current goals can be found in the Care Plan section)  Acute Rehab PT Goals Patient Stated Goal: To decrease pain PT Goal Formulation: With patient/family Time For Goal Achievement: 07/31/17 Potential to Achieve Goals: Good    Frequency Min 4X/week   Barriers to discharge        Co-evaluation               AM-PAC PT "6 Clicks" Daily Activity  Outcome Measure Difficulty turning over in bed (including adjusting bedclothes, sheets and blankets)?: Unable Difficulty moving from lying on back to sitting on the side of the bed? : Unable Difficulty sitting down on and standing up from a chair with arms (e.g., wheelchair, bedside commode, etc,.)?: Unable Help needed moving to and from a bed to chair (including a wheelchair)?: A Lot Help needed walking in hospital room?: A Lot Help needed climbing 3-5 steps with a railing? : Total 6 Click Score: 8    End of Session Equipment Utilized During Treatment: Oxygen Activity Tolerance: Patient limited by pain Patient left: in bed;with call bell/phone within reach;with family/visitor present Nurse Communication: Mobility status;Patient requests pain meds PT Visit Diagnosis: Difficulty in walking, not elsewhere classified (R26.2);Pain Pain - Right/Left: Left Pain -  part of body: Leg    Time: 1610-9604 PT Time Calculation (min) (ACUTE ONLY): 24 min   Charges:   PT Evaluation $PT Eval Moderate Complexity: 1 Mod PT Treatments $Therapeutic Activity: 8-22 mins   PT G Codes:        Durenda Hurt. Renaldo Fiddler, [facility] Acute Rehab Services Pager [phone]   Vena Austria 07/24/2017, 8:24 PM

## 2017-07-24 NOTE — Progress Notes (Signed)
Inpatient Diabetes Program Recommendations  AACE/ADA: New Consensus Statement on Inpatient Glycemic Control (2015)  Target Ranges:  Prepandial:   less than 140 mg/dL      Peak postprandial:   less than 180 mg/dL (1-2 hours)      Critically ill patients:  140 - 180 mg/dL   Lab Results  Component Value Date   GLUCAP 264 (H) 07/24/2017   HGBA1C 11.6 (H) 06/10/2017   Patient has noted from Endocrinologist for DM management outpatient, however, patient reports being followed by Dr. Hyman HopesWebb for DM management. She reports last seeing her 1 month ago and reduced her 70/30 dose from 90 units but can't remember her current dose.  Patient [facility] and could not afford it for this month. This is also why she does not know her current insulin dose.   Spoke with patient about [employer] 70/30 insulin being $25/vial but has to be the [employer] brand for it to be $25. Spoke with patient about A1c level 11.6% on 06/10/17. Encouraged glucose control for wound healing. Suggested her to ask her PCP, Dr. Hyman HopesWebb about Endocrinology consult.  Thanks,  Christena DeemShannon Shadonna Benedick RN, MSN, [facility] Inpatient Diabetes Coordinator Team Pager [phone] (8a-5p)

## 2017-07-24 NOTE — Progress Notes (Addendum)
  Assumed care ; patient alert & oriented times 3; c/o pain; HOB raised 30 degrees;   Morphine 4 mg IV given for pain 9 on pain scale 0-10  PT will be Plan of care today  @1900  Morphine 2 mg IV given for pain; Hydralazine given for BP  @1916  PT in room with patient   Family @ bedside; report given to on coming RN

## 2017-07-25 DIAGNOSIS — Z89512 Acquired absence of left leg below knee: Secondary | ICD-10-CM

## 2017-07-25 LAB — GLUCOSE, CAPILLARY
GLUCOSE-CAPILLARY: 289 mg/dL — AB (ref 65–99)
GLUCOSE-CAPILLARY: 344 mg/dL — AB (ref 65–99)
Glucose-Capillary: 307 mg/dL — ABNORMAL HIGH (ref 65–99)
Glucose-Capillary: 376 mg/dL — ABNORMAL HIGH (ref 65–99)
Glucose-Capillary: 260 mg/dL — ABNORMAL HIGH (ref 65–99)

## 2017-07-25 MED ORDER — INSULIN ASPART 100 UNIT/ML ~~LOC~~ SOLN
0.0000 [IU] | Freq: Every day | SUBCUTANEOUS | Status: DC
  Administered 2017-07-25: 3 [IU] via SUBCUTANEOUS
  Administered 2017-07-26: 4 [IU] via SUBCUTANEOUS
  Administered 2017-07-27: 3 [IU] via SUBCUTANEOUS
  Administered 2017-07-28: 2 [IU] via SUBCUTANEOUS
  Administered 2017-07-29: 3 [IU] via SUBCUTANEOUS

## 2017-07-25 MED ORDER — INSULIN ASPART PROT & ASPART (70-30 MIX) 100 UNIT/ML ~~LOC~~ SUSP
23.0000 [IU] | Freq: Two times a day (BID) | SUBCUTANEOUS | Status: DC
  Administered 2017-07-25 – 2017-07-30 (×11): 23 [IU] via SUBCUTANEOUS
  Filled 2017-07-25 (×2): qty 10

## 2017-07-25 MED ORDER — INSULIN ASPART 100 UNIT/ML ~~LOC~~ SOLN
0.0000 [IU] | Freq: Three times a day (TID) | SUBCUTANEOUS | Status: DC
  Administered 2017-07-25: 15 [IU] via SUBCUTANEOUS
  Administered 2017-07-25 – 2017-07-26 (×3): 11 [IU] via SUBCUTANEOUS
  Administered 2017-07-26: 3 [IU] via SUBCUTANEOUS
  Administered 2017-07-27: 5 [IU] via SUBCUTANEOUS
  Administered 2017-07-27: 11 [IU] via SUBCUTANEOUS
  Administered 2017-07-27: 8 [IU] via SUBCUTANEOUS
  Administered 2017-07-28: 11 [IU] via SUBCUTANEOUS
  Administered 2017-07-28 (×2): 8 [IU] via SUBCUTANEOUS
  Administered 2017-07-29: 11 [IU] via SUBCUTANEOUS
  Administered 2017-07-29: 15 [IU] via SUBCUTANEOUS
  Administered 2017-07-29 – 2017-07-30 (×2): 8 [IU] via SUBCUTANEOUS
  Administered 2017-07-30: 11 [IU] via SUBCUTANEOUS

## 2017-07-25 NOTE — Progress Notes (Signed)
Rehab Admissions Coordinator Note:  Patient was screened by Trish MageLogue, Meer Reindl M for appropriateness for an Inpatient Acute Rehab Consult.  At this time, we are recommending Inpatient Rehab consult.  Trish MageLogue, Jamauri Kruzel M 07/25/2017, 8:48 AM  I can be reached at [phone].

## 2017-07-25 NOTE — Progress Notes (Signed)
I met with pt at bedside to discuss goals and expectations of an inpt rehab admission. She lives with her sister at home and was Mod I with RW pta. I await OT eval and then will begin insurance authorization with [facility] for a possible inpt rehab admit pending their approval. [phone]

## 2017-07-25 NOTE — Progress Notes (Addendum)
[facility] [place]  Subjective  - Doing well over all.   Objective (!) 154/74 70 98.6 F (37 C) (Oral) 13 94%  Intake/Output Summary (Last 24 hours) at 07/25/17 0753 Last data filed at 07/25/17 0449  Gross per 24 hour  Intake                0 ml  Output             1600 ml  Net            -1600 ml   Left BKA amputation viable, dressing changed Right foot dorsal wound between 4th and 5th digits dressing changed with santyl.  Yellow eschar without surrounding erythema.    Assessment/Planning: POD # 2 left BKA Right foot wound dressing changed with santyl  Pending CIR consult for discharge plan  Clinton GallantCOLLINS, EMMA [facility] 07/25/2017 7:53 AM --  Laboratory Lab Results:  Recent Labs  07/23/17 1904 07/24/17 0250  WBC 11.6* 10.8*  HGB 12.1 12.1  HCT 37.7 37.0  PLT 194 202   BMET  Recent Labs  07/23/17 1904 07/24/17 0250  NA  --  133*  K  --  4.9  CL  --  99*  CO2  --  23  GLUCOSE  --  348*  BUN  --  10  CREATININE 0.74 0.73  CALCIUM  --  8.6*    COAG Lab Results  Component Value Date   INR 0.92 06/09/2017   No results found for: PTT   I have independently interviewed and examined patient and agree with PA assessment and plan above.   Shalene Gallen C. Randie Heinzain, MD [facility] [place] Office: [phone] Pager: [phone]

## 2017-07-25 NOTE — Consult Note (Signed)
Physical Medicine and Rehabilitation Consult Reason for Consult:Decreased functional mobility Referring Physician: Dr. Randie Heinzain   HPI: Danielle Buchanan is a 61 y.o.right handed female with history of hypertension,diabetes mellitus,and tobacco abuse. Presented 10/16/2018with nonhealing left foot ulcer.per chart review patient lives with sister. One level home. Used a walker prior to admission.Limb was not felt to be salvageable and underwent left BKA 07/23/2017 per Dr. Randie Heinzain.Hospital course pain management.Subcutaneous heparin for DVT prophylaxis.Physical therapy evaluation completed 07/24/2017 with recommendations of physical medicine rehabilitation consult.   Review of Systems  Constitutional: Negative for chills and fever.  HENT: Negative for hearing loss.   Eyes: Negative for blurred vision and double vision.  Respiratory: Negative for cough.        Shortness of breath on exertion  Cardiovascular: Positive for leg swelling. Negative for chest pain and palpitations.  Gastrointestinal: Positive for constipation. Negative for nausea and vomiting.  Genitourinary: Negative for dysuria and hematuria.       Stress urinary incontinence  Musculoskeletal: Positive for joint pain and myalgias.  Skin: Negative for rash.  Neurological: Positive for weakness. Negative for seizures.  Psychiatric/Behavioral: Positive for depression. The patient has insomnia.   All other systems reviewed and are negative.  Past Medical History:  Diagnosis Date  . Allergic rhinitis   . Depression   . Diabetes (HCC)   . Dyslipidemia   . HTN (hypertension)   . Insomnia   . OSA (obstructive sleep apnea)    no cpap  . Urinary incontinence    Past Surgical History:  Procedure Laterality Date  . ABDOMINAL AORTOGRAM W/LOWER EXTREMITY N/A 06/24/2017   Procedure: ABDOMINAL AORTOGRAM W/LOWER EXTREMITY;  Surgeon: Maeola Harmanain, Brandon Christopher, MD;  [facility];  Service: Cardiovascular;  Laterality: N/A;    . AMPUTATION Left 07/23/2017   Procedure: AMPUTATION  BELOW KNEE;  Surgeon: Maeola Harmanain, Brandon Christopher, MD;  [facility];  Service: Vascular;  Laterality: Left;  . CARPAL TUNNEL RELEASE Right   . COLONOSCOPY     Family History  Problem Relation Age of Onset  . Heart attack Mother   . Alcohol abuse Mother   . Heart attack Father   . Alcohol abuse Father   . Heart attack Sister   . Heart attack Brother   . Hypercholesterolemia Sister   . Diabetes Neg Hx    Social History:  reports that she quit smoking about 3 weeks ago. Her smoking use included Cigarettes. She has a 51.00 pack-year smoking history. She has never used smokeless tobacco. She reports that she does not drink alcohol or use drugs. Allergies:  Allergies  Allergen Reactions  . Doxycycline Rash   Medications Prior to Admission  Medication Sig Dispense Refill  . amitriptyline (ELAVIL) 25 MG tablet Take 25 mg by mouth at bedtime.     . cetirizine (ZYRTEC) 10 MG tablet Take 10 mg by mouth daily.     . [employer] NICOTINE TRANSDERMAL SYS 14 MG/24HR patch Apply 1 patch daily as needed for smoking cessation  11  . gabapentin (NEURONTIN) 300 MG capsule Take 300 mg by mouth 3 (three) times daily as needed (pain).   0  . HYDROcodone-acetaminophen (NORCO/VICODIN) 5-325 MG tablet Take 1 tablet by mouth 3 (three) times daily.   0  . insulin NPH-regular Human (NOVOLIN 70/30) (70-30) 100 UNIT/ML injection 120 units with breakfast, and 90 units with the evening meal (Patient taking differently: Inject 90 Units into the skin 2 (two) times daily. ) 70 mL 11  .  losartan (COZAAR) 100 MG tablet Take 100 mg by mouth daily.    . mupirocin cream (BACTROBAN) 2 % Apply 1 application topically daily as needed (sores).   0  . oxyCODONE-acetaminophen (PERCOCET/ROXICET) 5-325 MG tablet Take 1-2 tablets by mouth every 6 (six) hours as needed for severe pain. 20 tablet 0  . pravastatin (PRAVACHOL) 20 MG tablet Take 20 mg by mouth daily.     . pregabalin  (LYRICA) 75 MG capsule Take 75 mg by mouth 2 (two) times daily.    . sertraline (ZOLOFT) 100 MG tablet Take 150 mg by mouth daily.     . traZODone (DESYREL) 50 MG tablet Take 50 mg by mouth at bedtime.    Marland Kitchen levofloxacin (LEVAQUIN) 500 MG tablet Take 1 tablet (500 mg total) by mouth daily. (Patient not taking: Reported on 07/19/2017) 7 tablet 0  . sulfamethoxazole-trimethoprim (BACTRIM DS,SEPTRA DS) 800-160 MG tablet Take 1 tablet by mouth 2 (two) times daily. (Patient not taking: Reported on 07/19/2017) 20 tablet 0    Home: Home Living Family/patient expects to be discharged to:: Private residence Living Arrangements: Other relatives (Sister) Available Help at Discharge: Family, Available 24 hours/day Type of Home: Apartment Home Access: Level entry Home Layout: One level Bathroom Shower/Tub: Engineer, manufacturing systems: Standard Home Equipment: Environmental consultant - 4 wheels  Functional History: Prior Function Level of Independence: Needs assistance Gait / Transfers Assistance Needed: Uses Rollator for mobility. Reports she sometimes needs help with mobility from sister.  ADL's / Homemaking Assistance Needed: Patient reports she does her own bathing/dressing.  Prior documentation reports sister assists. Functional Status:  Mobility: Bed Mobility Overal bed mobility: Needs Assistance Bed Mobility: Rolling, Sidelying to Sit, Sit to Sidelying Rolling: Mod assist Sidelying to sit: Mod assist Sit to sidelying: Mod assist General bed mobility comments: Patient requiring mod assist to roll to both sides to don underpants.  Attempted to have patient reach for rails and use RLE to assist.  Patient calls for sister to pull her over using hands.  To move to sitting, patient again reaching for sister to pull her up.  Verbal cues for proper technique.  Patient was able to sit EOB x 10 minutes.  Pain increasing and requested to return to supine. Transfers General transfer comment: NT      ADL:     Cognition: Cognition Overall Cognitive Status: Within Functional Limits for tasks assessed Orientation Level: Oriented X4 Cognition Arousal/Alertness: Awake/alert Behavior During Therapy: Anxious, Flat affect Overall Cognitive Status: Within Functional Limits for tasks assessed  Blood pressure 127/76, pulse 73, temperature 98.2 F (36.8 C), temperature source Oral, resp. rate 12, height 5\' 1"  (1.549 m), weight 106.1 kg (234 lb), SpO2 93 %. Physical Exam  Vitals reviewed. Constitutional:  Obese   HENT:  Head: Normocephalic.  Eyes: EOM are normal.  Neck: Normal range of motion. Neck supple. No thyromegaly present.  Cardiovascular: Normal rate, regular rhythm and normal heart sounds.   Respiratory: Effort normal. No respiratory distress.  GI: Soft. Bowel sounds are normal. She exhibits no distension.  Neurological:  Lethargic but arousable. Follows commands. Provides name and age  Skin:  Left BKA is dressed/ACE appropriately tender. Right foot dressed with Santyl. Wound on dorsum of foot.   Psychiatric: She has a normal mood and affect.  drowsy    Results for orders placed or performed during the hospital encounter of 07/23/17 (from the past 24 hour(s))  Glucose, capillary     Status: Abnormal   Collection Time: 07/24/17 11:25  AM  Result Value Ref Range   Glucose-Capillary 264 (H) 65 - 99 mg/dL  Glucose, capillary     Status: Abnormal   Collection Time: 07/24/17  4:07 PM  Result Value Ref Range   Glucose-Capillary 416 (H) 65 - 99 mg/dL  Glucose, capillary     Status: Abnormal   Collection Time: 07/24/17  6:26 PM  Result Value Ref Range   Glucose-Capillary 407 (H) 65 - 99 mg/dL  Glucose, capillary     Status: Abnormal   Collection Time: 07/24/17  9:49 PM  Result Value Ref Range   Glucose-Capillary 372 (H) 65 - 99 mg/dL   Comment 1 Notify RN    Comment 2 Document in Chart   Glucose, capillary     Status: Abnormal   Collection Time: 07/25/17 12:24 AM  Result Value  Ref Range   Glucose-Capillary 344 (H) 65 - 99 mg/dL  Glucose, capillary     Status: Abnormal   Collection Time: 07/25/17  6:02 AM  Result Value Ref Range   Glucose-Capillary 289 (H) 65 - 99 mg/dL   Comment 1 Notify RN    Comment 2 Document in Chart    No results found.  Assessment/Plan: Diagnosis: Left BKA, PAD 1. Does the need for close, 24 hr/day medical supervision in concert with the patient's rehab needs make it unreasonable for this patient to be served in a less intensive setting? Yes 2. Co-Morbidities requiring supervision/potential complications: morbid obesity, DM, HTN, RLE wound 3. Due to bladder management, bowel management, safety, skin/wound care, disease management, medication administration, pain management and patient education, does the patient require 24 hr/day rehab nursing? Yes 4. Does the patient require coordinated care of a physician, rehab nurse, PT (1-2 hrs/day, 5 days/week) and OT (1-2 hrs/day, 5 days/week) to address physical and functional deficits in the context of the above medical diagnosis(es)? Yes Addressing deficits in the following areas: balance, endurance, locomotion, strength, transferring, bowel/bladder control, bathing, dressing, feeding, grooming and toileting 5. Can the patient actively participate in an intensive therapy program of at least 3 hrs of therapy per day at least 5 days per week? Yes 6. The potential for patient to make measurable gains while on inpatient rehab is excellent 7. Anticipated functional outcomes upon discharge from inpatient rehab are modified independent and supervision  with PT, modified independent and supervision with OT, n/a with SLP. 8. Estimated rehab length of stay to reach the above functional goals is: 10-13 days 9. Anticipated D/C setting: Home 10. Anticipated post D/C treatments: HH therapy 11. Overall Rehab/Functional Prognosis: excellent  RECOMMENDATIONS: This patient's condition is appropriate for continued  rehabilitative care in the following setting: CIR Patient has agreed to participate in recommended program. Yes Note that insurance prior authorization may be required for reimbursement for recommended care.  Comment: Rehab Admissions Coordinator to follow up.  Thanks,  Ranelle Oyster, MD, [place] [facility]    Charlton Amor., PA-C 07/25/2017

## 2017-07-25 NOTE — Evaluation (Signed)
Occupational Therapy Evaluation Patient Details Name: Danielle Buchanan MRN: [id number] DOB: [birth year] Today's Date: 07/25/2017    History of Present Illness Patient is a 61 yo female admitted 07/23/17 with pain in Lt foot.  Patient with non-healing wounds, now s/p Lt BKA on 07/23/17.    PMH:  PVK, KM, chronic foot ulcers, depression, HTN, HLD, OSA   Clinical Impression   This 61 yo female admitted and underwent above presents to acute OT with decreased mobility, decreased balance, increased pain all affecting this pt's PLOF of being min A only for basic ADLs and now is Max A for some basics ADLs. She will benefit from acute OT with follow up OT on CIR to get back to min A or better level.      Follow Up Recommendations  CIR;Supervision/Assistance - 24 hour    Equipment Recommendations  Tub/shower bench;Other (comment) (drop arm 3n1)       Precautions / Restrictions Precautions Precautions: Fall Precaution Comments: New Lt BKA Restrictions Weight Bearing Restrictions: No      Mobility Bed Mobility Overal bed mobility: Needs Assistance Bed Mobility: Rolling;Sidelying to Sit;Sit to Supine Rolling: Min assist (use of rail and HOB elevated) Sidelying to sit: Min assist;HOB elevated (and A with rail)   Sit to supine: Min assist (min A HOB flat and use of rail)      Transfers Overall transfer level: Needs assistance Equipment used: Rolling walker (2 wheeled) Transfers: Sit to/from Stand Sit to Stand: Mod assist;From elevated surface (increased time and cues for hand placement)              Balance Overall balance assessment: Needs assistance Sitting-balance support: No upper extremity supported (RLE supported)       Standing balance support: Bilateral upper extremity supported Standing balance-Leahy Scale: Poor Standing balance comment: reliant on RW for standing                           ADL either performed or assessed with clinical judgement   ADL  Overall ADL's : Needs assistance/impaired Eating/Feeding: Independent;Sitting   Grooming: Min guard;Sitting (EOB)   Upper Body Bathing: Min guard;Sitting (EOB)   Lower Body Bathing: Moderate assistance;Bed level   Upper Body Dressing : Min guard;Sitting (EOB)   Lower Body Dressing: Maximal assistance;Bed level     Toilet Transfer Details (indicate cue type and reason): Pt Mod A sit<>stand at edge of raised bed with VCs for hand placement; but then unable to hop or pivot on RLE for transfer Toileting- Clothing Manipulation and Hygiene: Total assistance Toileting - Clothing Manipulation Details (indicate cue type and reason): Pt Mod A sit<>stand at edge of raised bed with VCs for hand placement             Vision Patient Visual Report: No change from baseline              Pertinent Vitals/Pain Pain Assessment: 0-10 Pain Score: 5  Pain Location: LLE in dependent position Pain Descriptors / Indicators: Throbbing;Grimacing Pain Intervention(s): Limited activity within patient's tolerance;Monitored during session;Repositioned     Hand Dominance Right   Extremity/Trunk Assessment Upper Extremity Assessment Upper Extremity Assessment: Generalized weakness           Communication Communication Communication: No difficulties   Cognition Arousal/Alertness: Awake/alert Behavior During Therapy: WFL for tasks assessed/performed Overall Cognitive Status: Within Functional Limits for tasks assessed  Home Living Family/patient expects to be discharged to:: Private residence Living Arrangements: Other relatives (sister) Available Help at Discharge: Family;Available 24 hours/day Type of Home: Apartment Home Access: Level entry     Home Layout: One level     Bathroom Shower/Tub: Chief Strategy OfficerTub/shower unit   Bathroom Toilet: Standard     Home Equipment: Walker - 4 wheels          Prior  Functioning/Environment Level of Independence: Needs assistance  Gait / Transfers Assistance Needed: Uses Rollator for mobility. Reports she sometimes needs help with mobility from sister.  ADL's / Homemaking Assistance Needed: Patient reports she does her own bathing/dressing (except socks and shoes and washing feet)            OT Problem List: Decreased strength;Decreased range of motion;Impaired balance (sitting and/or standing);Pain;Decreased knowledge of use of DME or AE      OT Treatment/Interventions: Self-care/ADL training;Therapeutic activities;Patient/family education;DME and/or AE instruction;Balance training;Therapeutic exercise    OT Goals(Current goals can be found in the care plan section) Acute Rehab OT Goals Patient Stated Goal: to decrease pain and be as independent as possible again OT Goal Formulation: With patient Time For Goal Achievement: 08/08/17 Potential to Achieve Goals: Good  OT Frequency: Min 3X/week              AM-PAC PT "6 Clicks" Daily Activity     Outcome Measure Help from another person eating meals?: None Help from another person taking care of personal grooming?: A Little Help from another person toileting, which includes using toliet, bedpan, or urinal?: A Lot Help from another person bathing (including washing, rinsing, drying)?: A Lot Help from another person to put on and taking off regular upper body clothing?: A Little Help from another person to put on and taking off regular lower body clothing?: A Lot 6 Click Score: 16   End of Session Equipment Utilized During Treatment: Gait belt;Rolling walker Nurse Communication:  (charge nurse (bandage off of left foot)--pt's nurse having to be another room at present)  Activity Tolerance: Patient tolerated treatment well Patient left: in bed;with call bell/phone within reach;with bed alarm set  OT Visit Diagnosis: Unsteadiness on feet (R26.81);Other abnormalities of gait and mobility  (R26.89);Muscle weakness (generalized) (M62.81);Pain Pain - Right/Left: Left Pain - part of body: Leg                Time: [id number] OT Time Calculation (min): 19 min Charges:  OT General Charges $OT Visit: 1 Visit OT Evaluation $OT Eval Moderate Complexity: [address] Mod Cathy Manuel [place], [place] [phone] 07/25/2017

## 2017-07-25 NOTE — Progress Notes (Signed)
OT Cancellation Note  Patient Details Name: Josefine ClassJanice Fiorini MRN: [id number] DOB: Feb 12, 1956   Cancelled Treatment:    Reason Eval/Treat Not Completed: Other (comment). Pt just back to bed and 12 out of 10 on pain scale. RN aware and will give her pain meds. Will try back later as schedule allows for eval.  Evette GeorgesLeonard, Lurie Mullane Eva [phone] 07/25/2017, 2:12 PM

## 2017-07-25 NOTE — Progress Notes (Signed)
Physical Therapy Treatment Patient Details Name: Danielle Buchanan MRN: [id number] DOB: 1956/01/23 Today's Date: 07/25/2017    History of Present Illness Patient is a 61 yo female admitted 07/23/17 with pain in Lt foot.  Patient with non-healing wounds, now s/p Lt BKA on 07/23/17.    PMH:  PVK, KM, chronic foot ulcers, depression, HTN, HLD, OSA    PT Comments    Patient is making progress toward mobility goals. Pt was able to stand X1 from EOB with mod A +2 and RW for ~30 seconds and was then able to perform lateral scoot transfer with mod A +2. Continue to progress as tolerated.    Follow Up Recommendations  CIR;Supervision for mobility/OOB     Equipment Recommendations  3in1 (PT);Wheelchair (measurements PT);Wheelchair cushion (measurements PT)    Recommendations for Other Services Rehab consult;OT consult     Precautions / Restrictions Precautions Precautions: Fall Precaution Comments: New Lt BKA Restrictions Weight Bearing Restrictions: No    Mobility  Bed Mobility Overal bed mobility: Needs Assistance Bed Mobility: Supine to Sit     Supine to sit: Mod assist;HOB elevated     General bed mobility comments: cues for sequencing and technique; use of bed rail; assist required to bring L Hip toward EOB and to elevate trunk into sitting  Transfers Overall transfer level: Needs assistance Equipment used: Rolling walker (2 wheeled) Transfers: Sit to/from Stand;Lateral/Scoot Transfers Sit to Stand: Mod assist;+2 physical assistance;From elevated surface        Lateral/Scoot Transfers: Mod assist;+2 physical assistance General transfer comment: pt stood X1 from EOB for ~30second before becoming fatigued; pt unable to pivot R foot this session; lateral scoot transfer EOB to recliner with use of bed pad and gait belt; cues for safe hand placement, weight shifting, and technique required  Ambulation/Gait             General Gait Details: unable this  session   Stairs            Wheelchair Mobility    Modified Rankin (Stroke Patients Only)       Balance Overall balance assessment: Needs assistance Sitting-balance support: Feet supported;Single extremity supported Sitting balance-Leahy Scale: Fair     Standing balance support: Bilateral upper extremity supported Standing balance-Leahy Scale: Poor                              Cognition Arousal/Alertness: Lethargic;Suspect due to medications Behavior During Therapy: Flat affect Overall Cognitive Status: Within Functional Limits for tasks assessed                                 General Comments: pt drowsy but easily arousable throughout session; pt had pain medication prior to session      Exercises      General Comments General comments (skin integrity, edema, etc.): pt educated on positioning of L LE       Pertinent Vitals/Pain Pain Assessment: Faces Faces Pain Scale: Hurts even more Pain Location: LLE with mobility Pain Descriptors / Indicators: Grimacing;Guarding;Sore Pain Intervention(s): Limited activity within patient's tolerance;Monitored during session;Premedicated before session;Repositioned    Home Living                      Prior Function            PT Goals (current goals can now be found in the care  plan section) Acute Rehab PT Goals PT Goal Formulation: With patient/family Time For Goal Achievement: 07/31/17 Potential to Achieve Goals: Good Progress towards PT goals: Progressing toward goals    Frequency    Min 4X/week      PT Plan Current plan remains appropriate    Co-evaluation              AM-PAC PT "6 Clicks" Daily Activity  Outcome Measure  Difficulty turning over in bed (including adjusting bedclothes, sheets and blankets)?: Unable Difficulty moving from lying on back to sitting on the side of the bed? : Unable Difficulty sitting down on and standing up from a chair with  arms (e.g., wheelchair, bedside commode, etc,.)?: Unable Help needed moving to and from a bed to chair (including a wheelchair)?: A Lot Help needed walking in hospital room?: Total Help needed climbing 3-5 steps with a railing? : Total 6 Click Score: 7    End of Session Equipment Utilized During Treatment: Gait belt Activity Tolerance: Patient tolerated treatment well Patient left: with call bell/phone within reach;in chair;with chair alarm set Nurse Communication: Mobility status PT Visit Diagnosis: Difficulty in walking, not elsewhere classified (R26.2);Pain Pain - Right/Left: Left Pain - part of body: Leg     Time: 0920-0949 PT Time Calculation (min) (ACUTE ONLY): 29 min  Charges:  $Therapeutic Activity: 23-37 mins                    G Codes:       [name], PTA Pager: [phone]     [name] 07/25/2017, 11:08 AM

## 2017-07-26 LAB — GLUCOSE, CAPILLARY
GLUCOSE-CAPILLARY: 187 mg/dL — AB (ref 65–99)
GLUCOSE-CAPILLARY: 316 mg/dL — AB (ref 65–99)
GLUCOSE-CAPILLARY: 323 mg/dL — AB (ref 65–99)
Glucose-Capillary: 311 mg/dL — ABNORMAL HIGH (ref 65–99)

## 2017-07-26 NOTE — [place] FL2 (Signed)
[place] MEDICAID FL2 LEVEL OF CARE SCREENING TOOL     IDENTIFICATION  Patient Name: Danielle Buchanan Birthdate: November 17, 1955 Sex: female Admission Date (Current Location): 07/23/2017  [facility] and [place] Number:  Producer, television/film/video and Address:  [facility] [place], [place], [address], [place], [place] [address]      Provider Number: [id number]  Attending Physician Name and Address:  Juventino Slovak*  Relative Name and Phone Number:       Current Level of Care: Hospital Recommended Level of Care: Skilled Nursing Facility Prior Approval Number:    Date Approved/Denied:   PASRR Number: Manual review  Discharge Plan: SNF    Current Diagnoses: Patient Active Problem List   Diagnosis Date Noted  . PAD (peripheral artery disease) (HCC) 07/23/2017  . Cellulitis 06/10/2017  . Hyponatremia 06/10/2017  . Fever 06/10/2017  . Rash 05/24/2017  . Diabetes (HCC)   . HTN (hypertension)   . Allergic rhinitis   . Insomnia   . Depression     Orientation RESPIRATION BLADDER Height & Weight     Self, Time, Situation, Place  Normal Continent, External catheter Weight: 234 lb (106.1 kg) Height:  5\' 1"  (154.9 cm)  BEHAVIORAL SYMPTOMS/MOOD NEUROLOGICAL BOWEL NUTRITION STATUS   (None)  (None) Continent Diet (Carb modified)  AMBULATORY STATUS COMMUNICATION OF NEEDS Skin   Limited Assist Verbally Surgical wounds, Other (Comment) (Amputation. Diabetic ulcer: right and left toe.)                       Personal Care Assistance Level of Assistance  Bathing, Feeding, Dressing Bathing Assistance: Limited assistance Feeding assistance: Independent Dressing Assistance: Maximum assistance     Functional Limitations Info  Sight, Hearing, Speech Sight Info: Adequate Hearing Info: Adequate Speech Info: Adequate    SPECIAL CARE FACTORS FREQUENCY  PT (By licensed PT), OT (By licensed OT)     PT Frequency: 5 x week OT Frequency: 5 x week             Contractures Contractures Info: Not present    Additional Factors Info  Code Status, Allergies, Psychotropic Code Status Info: Full Allergies Info: Doxycycline Psychotropic Info: Depression: Zoloft 150 mg PO daily, Trazodone 50 mg PO QHS.         Current Medications (07/26/2017):  This is the current hospital active medication list Current Facility-Administered Medications  Medication Dose Route Frequency Provider Last Rate Last Dose  . 0.9 % NaCl with KCl 20 mEq/ L  infusion   Intravenous Continuous Maeola Harman, MD 100 mL/hr at 07/23/17 2142    . alum & mag hydroxide-simeth (MAALOX/MYLANTA) 200-200-20 MG/5ML suspension 15-30 mL  15-30 mL Oral Q2H PRN Maeola Harman, MD      . amitriptyline (ELAVIL) tablet 25 mg  25 mg Oral QHS Maeola Harman, MD   25 mg at 07/25/17 2037  . collagenase (SANTYL) ointment   Topical Daily Maeola Harman, MD      . gabapentin (NEURONTIN) capsule 300 mg  300 mg Oral TID PRN Maeola Harman, MD   300 mg at 07/26/17 0631  . guaiFENesin-dextromethorphan (ROBITUSSIN DM) 100-10 MG/5ML syrup 15 mL  15 mL Oral Q4H PRN Maeola Harman, MD      . heparin injection 5,000 Units  5,000 Units Subcutaneous Q8H Maeola Harman, MD   5,000 Units at 07/26/17 1404  . hydrALAZINE (APRESOLINE) injection 5 mg  5 mg Intravenous Q20 Min PRN Lemar Livings  Cristal Deerhristopher, MD   5 mg at 07/24/17 1851  . insulin aspart (novoLOG) injection 0-15 Units  0-15 Units Subcutaneous TID WC Lars Mageollins, Emma M, PA-C   3 Units at 07/26/17 1232  . insulin aspart (novoLOG) injection 0-5 Units  0-5 Units Subcutaneous QHS Lars MageCollins, Emma M, [place]-C   3 Units at 07/25/17 2110  . insulin aspart protamine- aspart (NOVOLOG MIX 70/30) injection 23 Units  23 Units Subcutaneous BID WC Lars MageCollins, Emma M, PA-C   23 Units at 07/26/17 0631  . labetalol (NORMODYNE,TRANDATE) injection 10 mg  10 mg Intravenous Q10 min PRN Maeola Harmanain, Brandon Christopher, MD    10 mg at 07/25/17 0559  . loratadine (CLARITIN) tablet 10 mg  10 mg Oral Daily Maeola Harmanain, Brandon Christopher, MD   10 mg at 07/26/17 0757  . losartan (COZAAR) tablet 100 mg  100 mg Oral Daily Maeola Harmanain, Brandon Christopher, MD   100 mg at 07/26/17 0757  . metoprolol tartrate (LOPRESSOR) injection 2-5 mg  2-5 mg Intravenous Q2H PRN Maeola Harmanain, Brandon Christopher, MD      . morphine 2 MG/ML injection 2-5 mg  2-5 mg Intravenous Q1H PRN Maeola Harmanain, Brandon Christopher, MD   2 mg at 07/26/17 1404  . ondansetron (ZOFRAN) injection 4 mg  4 mg Intravenous Q6H PRN Maeola Harmanain, Brandon Christopher, MD      . oxyCODONE-acetaminophen (PERCOCET/ROXICET) 5-325 MG per tablet 1-2 tablet  1-2 tablet Oral Q4H PRN Maeola Harmanain, Brandon Christopher, MD   2 tablet at 07/26/17 0755  . pantoprazole (PROTONIX) EC tablet 40 mg  40 mg Oral Daily Maeola Harmanain, Brandon Christopher, MD   40 mg at 07/26/17 0758  . phenol (CHLORASEPTIC) mouth spray 1 spray  1 spray Mouth/Throat PRN Maeola Harmanain, Brandon Christopher, MD      . potassium chloride SA (K-DUR,KLOR-CON) CR tablet 20-40 mEq  20-40 mEq Oral Once Maeola Harmanain, Brandon Christopher, MD      . pravastatin (PRAVACHOL) tablet 20 mg  20 mg Oral QHS Maeola Harmanain, Brandon Christopher, MD   20 mg at 07/25/17 2037  . pregabalin (LYRICA) capsule 75 mg  75 mg Oral BID Maeola Harmanain, Brandon Christopher, MD   75 mg at 07/26/17 0757  . sertraline (ZOLOFT) tablet 150 mg  150 mg Oral Daily Maeola Harmanain, Brandon Christopher, MD   150 mg at 07/26/17 0754  . traZODone (DESYREL) tablet 50 mg  50 mg Oral QHS Maeola Harmanain, Brandon Christopher, MD   50 mg at 07/25/17 2037     Discharge Medications: Please see discharge summary for a list of discharge medications.  Relevant Imaging Results:  Relevant Lab Results:   Additional Information SS#: [id number]  Margarito LinerSarah C Ketty Bitton, LCSW

## 2017-07-26 NOTE — Progress Notes (Signed)
[employer] has denied admission for inpt rehab. They recommend SNF. I met with pt at bedside to inform of insurance denial. She states she does not want to go to SNF. I encouraged her to reconsider and to receive SNF rehab prior to d/c home. I have alerted RN CM. We will sign off. [phone]

## 2017-07-26 NOTE — Clinical Social Work Note (Signed)
Clinical Social Work Assessment  Patient Details  Name: Danielle Buchanan MRN: [id number] Date of Birth: [birth year]  Date of referral:  07/26/17               Reason for consult:  Facility Placement, Discharge Planning                Permission sought to share information with:  Chartered certified accountant granted to share information::  Yes, Verbal Permission Granted  Name::        Agency::  SNF's  Relationship::     Contact Information:     Housing/Transportation Living arrangements for the past 2 months:  Single Family Home Source of Information:  Patient, Medical Team Patient Interpreter Needed:  None Criminal Activity/Legal Involvement Pertinent to Current Situation/Hospitalization:  No - Comment as needed Significant Relationships:  Other Family Members, Siblings Lives with:  Siblings Do you feel safe going back to the place where you live?  Yes Need for family participation in patient care:  Yes (Comment)  Care giving concerns:  Insurance denied admittance to CIR. CSW initiating SNF backup plan.   Social Worker assessment / plan:  CSW met with patient. No supports at bedside. CSW introduced role and explained that PT recommendations would be discussed. Patient upset that insurance denied CIR and really prefers to return home with home health. She states that she lives with her sister who is able to provide assistance. Discussed family concerns with her returning home without any rehab. Patient is agreeable to CSW faxing out referral. CSW provided SNF list for review. PASARR under manual review. Patient cannot go to SNF until PASARR obtained. No further concerns. CSW encouraged patient to contact CSW as needed CSW will continue to follow patient for support and facilitate discharge to SNF once medically stable.  Employment status:  Disabled (Comment on whether or not currently receiving Disability) Insurance information:  Managed Medicare PT Recommendations:   Inpatient Rehab Consult Information / Referral to community resources:  [place]  Patient/Family's Response to care:  Patient reluctant regarding SNF but is agreeable to CSW faxing out referral. Patient's family supportive and involved in patient's care. Patient appreciated social work intervention.  Patient/Family's Understanding of and Emotional Response to Diagnosis, Current Treatment, and Prognosis:  Patient has a good understanding of the reason for admission and her need for therapy at this time. Patient appears happy with hospital care although upset with insurance's decision.  Emotional Assessment Appearance:  Appears stated age Attitude/Demeanor/Rapport:  Other (Pleasant but obviously upset that insurance denied CIR.) Affect (typically observed):  Appropriate, Calm, Pleasant Orientation:  Oriented to Self, Oriented to Place, Oriented to  Time, Oriented to Situation Alcohol / Substance use:  Never Used Psych involvement (Current and /or in the community):  No (Comment)  Discharge Needs  Concerns to be addressed:  Care Coordination Readmission within the last 30 days:  Yes Current discharge risk:  Dependent with Mobility Barriers to Discharge:  Awaiting Regulatory affairs officer Tour manager), [place], LCSW 07/26/2017, 4:35 PM

## 2017-07-26 NOTE — Care Management Note (Addendum)
Case Management Note Danielle PieriniKristi Aleyza Salmi RN, BSN Unit 4E-Case Manager [phone]  Patient Details  Name: Danielle Buchanan MRN: [id number] Date of Birth: 1955-11-26  Subjective/Objective:   Pt admitted s/p BKA                 Action/Plan: PTA pt lived at home, has good family support, CIR consulted for possible admission- has submitted for insurance approval  Expected Discharge Date:                 Expected Discharge Plan:  Skilled Nursing Facility  In-House Referral:  Clinical Social Work  Discharge planning Services  CM Consult  Post Acute Care Choice:    Choice offered to:     DME Arranged:    DME Agency:     HH Arranged:    HH Agency:     Status of Service:  In process, will continue to follow  If discussed at Long Length of Stay Meetings, dates discussed:    Discharge Disposition:   Additional Comments:  07/26/17- 1500- Danielle PieriniKristi Dari Carpenito RN, CM- have been notified by Britta MccreedyBarbara with CIR that pt has been denied for CIR by insurance- have spoken with pt at bedside- pt unhappy with insurance decision and wants to return home with HH-however have discussed with pt that would be an unsafe plan at this time and encouraged pt to consider STSNF for rehab- pt asked this CM to call Tammy SoursGreg her nephew to discuss the d/c plan- call made Tammy SoursGreg and informed him of insurance decision- he states that [facility] and he is to call pt in room to speak with her- pt has sister that is willing to assist but has some limitations herself. After speaking with pt again- pt now agreeable to SNF search- have notified CSW for STSNF placement need- pt medically ready- have also called and notified M. Collins PA with [facility] of updated d/c plan.  [facility] also notified CM that they are following if needed.   Darrold SpanWebster, Vita Currin Hall, RN 07/26/2017, 3:17 PM

## 2017-07-26 NOTE — Progress Notes (Signed)
Orthopedic Tech Progress Note Patient Details:  Josefine ClassJanice Heap May 18, 1956 [id number] Brace completed by bio-tech. Patient ID: Josefine ClassJanice Manocchio, female   DOB: May 18, 1956, 61 y.o.   MRN: [id number]   Jennye MoccasinHughes, Smitty Ackerley Craig 07/26/2017, 3:02 PM

## 2017-07-26 NOTE — Progress Notes (Addendum)
assumed care from off going RN; call bell in reached  Patient up in chair ; alert & oriented; c/o pain 7 on pain scale 0-10; PA rounded on patient ; unwrapped BKA; no drainage; possibly rehab today; Diabetic coordinator will round later; call bell in reach

## 2017-07-26 NOTE — Clinical Social Work Placement (Signed)
   CLINICAL SOCIAL WORK PLACEMENT  NOTE  Date:  07/26/2017  Patient Details  Name: Danielle Buchanan MRN: [id number] Date of Birth: 1956-05-18  Clinical Social Work is seeking post-discharge placement for this patient at the Skilled  Nursing Facility level of care (*CSW will initial, date and re-position this form in  chart as items are completed):  Yes   Patient/family provided with [facility] Clinical Social Work Department's list of facilities offering this level of care within the geographic area requested by the patient (or if unable, by the patient's family).  Yes   Patient/family informed of their freedom to choose among providers that offer the needed level of care, that participate in Medicare, Medicaid or managed care program needed by the patient, have an available bed and are willing to accept the patient.  Yes   Patient/family informed of [facility]'s ownership interest in [facility] and [facility], as well as of the fact that they are under no obligation to receive care at these facilities.  PASRR submitted to EDS on 07/26/17     PASRR number received on       Existing PASRR number confirmed on       FL2 transmitted to all facilities in geographic area requested by pt/family on 07/26/17     FL2 transmitted to all facilities within larger geographic area on       Patient informed that his/her managed care company has contracts with or will negotiate with certain facilities, including the following:            Patient/family informed of bed offers received.  Patient chooses bed at       Physician recommends and patient chooses bed at      Patient to be transferred to   on  .  Patient to be transferred to facility by       Patient family notified on   of transfer.  Name of family member notified:        PHYSICIAN Please sign FL2     Additional Comment:    _______________________________________________ Margarito LinerSarah C Timira Bieda, LCSW 07/26/2017, 4:39  PM

## 2017-07-26 NOTE — Progress Notes (Addendum)
[facility]  Subjective  - she is doing well over all.   Objective (!) 145/91 86 98.7 F (37.1 C) (Oral) 14 93%  Intake/Output Summary (Last 24 hours) at 07/26/17 0738 Last data filed at 07/26/17 0500  Gross per 24 hour  Intake             1080 ml  Output             1850 ml  Net             -770 ml    Left BKA healing well, viable Right foot ulcer no change santyl to wound and dry dressing  Assessment/Planning: POD # 3 Left BKA  Santyl to right foot ulcer daily [employer] for BKA sock  Plan discharge to CIR when bed available  Staples out 4 weeks from surgery. F/U with Dr. Randie Heinzain in 4 weeks.   Clinton GallantCOLLINS, EMMA [facility] 07/26/2017 7:38 AM --  Laboratory Lab Results:  Recent Labs  07/23/17 1904 07/24/17 0250  WBC 11.6* 10.8*  HGB 12.1 12.1  HCT 37.7 37.0  PLT 194 202   BMET  Recent Labs  07/23/17 1904 07/24/17 0250  NA  --  133*  K  --  4.9  CL  --  99*  CO2  --  23  GLUCOSE  --  348*  BUN  --  10  CREATININE 0.74 0.73  CALCIUM  --  8.6*    COAG Lab Results  Component Value Date   INR 0.92 06/09/2017   No results found for: PTT  I have independently interviewed and examined patient and agree with PA assessment and plan above. I have discussed with her the need to keep mobility in her knee. Case also discussed via telephone with her nephew [name] at her request.   Luanna SalkBrandon C. Randie Heinzain, MD [facility] Office: [phone] Pager: [phone]

## 2017-07-26 NOTE — Progress Notes (Signed)
Physical Therapy Treatment Patient Details Name: Danielle Buchanan MRN: [id number] DOB: [birth year] Today's Date: 07/26/2017    History of Present Illness Patient is a 61 yo female admitted 07/23/17 with pain in Lt foot.  Patient with non-healing wounds, now s/p Lt BKA on 07/23/17.    PMH:  PVK, KM, chronic foot ulcers, depression, HTN, HLD, OSA    PT Comments    Patient continues to progress toward mobility goals and is eager to participate in therapy. Pt is tolerating L LE therex well and able to perform sit to stands X5 with min/mod A +2 and ambulate 34ft forward/backwards. Current plan remains appropriate.     Follow Up Recommendations  CIR;Supervision for mobility/OOB     Equipment Recommendations  3in1 (PT);Wheelchair (measurements PT);Wheelchair cushion (measurements PT)    Recommendations for Other Services Rehab consult;OT consult     Precautions / Restrictions Precautions Precautions: Fall Precaution Comments: New Lt BKA Restrictions Weight Bearing Restrictions: Yes LLE Weight Bearing: Non weight bearing    Mobility  Bed Mobility Overal bed mobility: Needs Assistance Bed Mobility: Rolling;Sidelying to Sit;Sit to Supine Rolling: Supervision (use of rail and HOB elevated) Sidelying to sit: HOB elevated;Min guard   Sit to supine: Min guard   General bed mobility comments: use of rail; cues for sequencing; min guard for safety  Transfers Overall transfer level: Needs assistance Equipment used: Rolling walker (2 wheeled) Transfers: Sit to/from Stand Sit to Stand: Mod assist;Min assist;+2 safety/equipment         General transfer comment: mod A +2 initial stand and min A +2 next sit to stands X4; cues for safe hand placement and technique; pt demonstrated carry over throughout session  Ambulation/Gait Ambulation/Gait assistance: Mod assist;+2 safety/equipment Ambulation Distance (Feet):  (18ft forward and 45ft backwards) Assistive device: Rolling walker (2  wheeled) Gait Pattern/deviations: Step-to pattern     General Gait Details: cues for sequencing, posture, proximity of AD, and technique   Stairs            Wheelchair Mobility    Modified Rankin (Stroke Patients Only)       Balance Overall balance assessment: Needs assistance Sitting-balance support: No upper extremity supported (RLE supported) Sitting balance-Leahy Scale: Fair     Standing balance support: Bilateral upper extremity supported Standing balance-Leahy Scale: Poor Standing balance comment: reliant on RW for standing                            Cognition Arousal/Alertness: Awake/alert Behavior During Therapy: WFL for tasks assessed/performed Overall Cognitive Status: Within Functional Limits for tasks assessed                                 General Comments: pt is a little impulsive and needs cues to wait for therapist to stand      Exercises Amputee Exercises Hip Flexion/Marching: AROM;Left;10 reps;Supine Knee Extension: AROM;Left;Seated;10 reps    General Comments        Pertinent Vitals/Pain Pain Assessment: Faces Faces Pain Scale: Hurts little more Pain Location: L LE with knee flexion Pain Descriptors / Indicators: Sore Pain Intervention(s): Limited activity within patient's tolerance;Monitored during session;Premedicated before session;Repositioned    Home Living                      Prior Function            PT Goals (current  goals can now be found in the care plan section) Acute Rehab PT Goals Patient Stated Goal: to decrease pain and be as independent as possible again PT Goal Formulation: With patient/family Time For Goal Achievement: 07/31/17 Potential to Achieve Goals: Good Progress towards PT goals: Progressing toward goals    Frequency    Min 4X/week      PT Plan Current plan remains appropriate    Co-evaluation              AM-PAC PT "6 Clicks" Daily Activity   Outcome Measure  Difficulty turning over in bed (including adjusting bedclothes, sheets and blankets)?: Unable Difficulty moving from lying on back to sitting on the side of the bed? : Unable Difficulty sitting down on and standing up from a chair with arms (e.g., wheelchair, bedside commode, etc,.)?: Unable Help needed moving to and from a bed to chair (including a wheelchair)?: A Lot Help needed walking in hospital room?: A Lot Help needed climbing 3-5 steps with a railing? : Total 6 Click Score: 8    End of Session Equipment Utilized During Treatment: Gait belt Activity Tolerance: Patient tolerated treatment well Patient left: with call bell/phone within reach;in bed;with family/visitor present Nurse Communication: Mobility status PT Visit Diagnosis: Difficulty in walking, not elsewhere classified (R26.2);Pain Pain - Right/Left: Left Pain - part of body: Leg     Time: [id number] PT Time Calculation (min) (ACUTE ONLY): 23 min  Charges:  $Therapeutic Activity: 23-37 mins                    G Codes:       Danielle Buchanan, PTA Pager: [phone]     Danielle Buchanan 07/26/2017, 11:06 AM

## 2017-07-26 NOTE — Progress Notes (Signed)
Inpatient Diabetes Program Recommendations  AACE/ADA: New Consensus Statement on Inpatient Glycemic Control (2015)  Target Ranges:  Prepandial:   less than 140 mg/dL      Peak postprandial:   less than 180 mg/dL (1-2 hours)      Critically ill patients:  140 - 180 mg/dL   Lab Results  Component Value Date   GLUCAP 187 (H) 07/26/2017   HGBA1C 11.6 (H) 06/10/2017    Review of Glycemic ControlResults for Josefine ClassWEINKE, [place] (MRN [id number]) as of 07/26/2017 14:17  Ref. Range 07/25/2017 11:11 07/25/2017 15:45 07/25/2017 20:50 07/26/2017 06:21 07/26/2017 11:19  Glucose-Capillary Latest Ref Range: 65 - 99 mg/dL 409307 (H) 811376 (H) 914260 (H) 311 (H) 187 (H)   Diabetes history: Type 2 DM Outpatient Diabetes medications: 70/30 - Patient unsure of dose but states MD reduced from 90 units bid Current orders for Inpatient glycemic control:  Novolog 70/30 23 units bid, Novolog moderate tid with meals and HS  Inpatient Diabetes Program Recommendations:   Please consider increasing 70/30 insulin to 35 units bid.  Will need to order Novolin 70/30 at d/c and [facility]ial. Discussed with RN.   Thanks, Beryl MeagerJenny Sheree Lalla, RN, BC-ADM Inpatient Diabetes Coordinator Pager [phone] (8a-5p)

## 2017-07-26 NOTE — Progress Notes (Signed)
Orthopedic Tech Progress Note Patient Details:  Danielle Buchanan Stmarie Jul 04, 1956 [id number]  Patient ID: Danielle Buchanan Danielle Buchanan, female   DOB: Jul 04, 1956, 61 y.o.   MRN: [id number]   Danielle Buchanan, [name] 07/26/2017, 9:19 AM Called in bio-tech brace order; spoke with Wylene MenLacey

## 2017-07-26 NOTE — Discharge Summary (Signed)
[facility] Discharge Summary   Patient ID:  Danielle Buchanan MRN: [id number] DOB/AGE: Oct 16, 1955 61 y.o.  Admit date: 07/23/2017 Discharge date: 07/26/2017 Date of Surgery: 07/23/2017 Surgeon: Surgeon(s): Maeola Harman, MD  Admission Diagnosis: nonviable tissue left foot  Discharge Diagnoses:  nonviable tissue left foot  Secondary Diagnoses: Past Medical History:  Diagnosis Date  . Allergic rhinitis   . Depression   . Diabetes (HCC)   . Dyslipidemia   . HTN (hypertension)   . Insomnia   . OSA (obstructive sleep apnea)    no cpap  . Urinary incontinence     Procedure(s): AMPUTATION  BELOW KNEE  Discharged Condition: good  HPI: Danielle Buchanan is a 61 y.o. female here for evaluation of her bilateral feet. She underwent angiogram recently is demonstrated non  reconstructive disease. She has quit smoking frankly. She is being seen in the wound center and considered for hyperbaric. She is having significant pain in the left foot where she has progressive gangrene. She is not having any fevers and not on antibiotics at this time.   Hospital Course:  Danielle Buchanan is a 61 y.o. female is S/P left Procedure(s): AMPUTATION  BELOW KNEE Left BKA healing well, viable Right foot ulcer no change santyl to wound and dry dressing  Assessment/Planning: POD # 3 Left BKA  Santyl to right foot ulcer daily [employer] for BKA sock  Plan discharge to CIR when bed available  Staples out 4 weeks from surgery. F/U with [name] in 4 weeks.  Consults:  Dr. Riley Kill [facility]   Significant Diagnostic Studies: CBC Lab Results  Component Value Date   WBC 10.8 (H) 07/24/2017   HGB 12.1 07/24/2017   HCT 37.0 07/24/2017   MCV 86.9 07/24/2017   PLT 202 07/24/2017    BMET    Component Value Date/Time   NA 133 (L) 07/24/2017 0250   K 4.9 07/24/2017 0250   CL 99 (L) 07/24/2017 0250   CO2 23 07/24/2017 0250   GLUCOSE 348 (H) 07/24/2017 0250   BUN 10 07/24/2017  0250   CREATININE 0.73 07/24/2017 0250   CALCIUM 8.6 (L) 07/24/2017 0250   GFRNONAA >60 07/24/2017 0250   GFRAA >60 07/24/2017 0250   COAG Lab Results  Component Value Date   INR 0.92 06/09/2017     Disposition:  Discharge to :Rehab Discharge Instructions    Call MD for:  redness, tenderness, or signs of infection (pain, swelling, bleeding, redness, odor or green/yellow discharge around incision site)    Complete by:  As directed    Call MD for:  severe or increased pain, loss or decreased feeling  in affected limb(s)    Complete by:  As directed    Call MD for:  temperature >100.5    Complete by:  As directed    Discharge instructions    Complete by:  As directed    May shower daily PRN   Resume previous diet    Complete by:  As directed      Allergies as of 07/26/2017      Reactions   Doxycycline Rash      Medication List    TAKE these medications   amitriptyline 25 MG tablet Commonly known as:  ELAVIL Take 25 mg by mouth at bedtime.   cetirizine 10 MG tablet Commonly known as:  ZYRTEC Take 10 mg by mouth daily.   [employer] NICOTINE TRANSDERMAL SYS 14 mg/24hr patch Generic drug:  nicotine Apply 1 patch daily as needed  for smoking cessation   gabapentin 300 MG capsule Commonly known as:  NEURONTIN Take 300 mg by mouth 3 (three) times daily as needed (pain).   HYDROcodone-acetaminophen 5-325 MG tablet Commonly known as:  NORCO/VICODIN Take 1 tablet by mouth 3 (three) times daily.   insulin NPH-regular Human (70-30) 100 UNIT/ML injection Commonly known as:  NOVOLIN 70/30 120 units with breakfast, and 90 units with the evening meal What changed:  how much to take  how to take this  when to take this  additional instructions   levofloxacin 500 MG tablet Commonly known as:  LEVAQUIN Take 1 tablet (500 mg total) by mouth daily.   losartan 100 MG tablet Commonly known as:  COZAAR Take 100 mg by mouth daily.   mupirocin cream 2 % Commonly known as:   BACTROBAN Apply 1 application topically daily as needed (sores).   oxyCODONE-acetaminophen 5-325 MG tablet Commonly known as:  PERCOCET/ROXICET Take 1-2 tablets by mouth every 6 (six) hours as needed for severe pain.   pravastatin 20 MG tablet Commonly known as:  PRAVACHOL Take 20 mg by mouth daily.   pregabalin 75 MG capsule Commonly known as:  LYRICA Take 75 mg by mouth 2 (two) times daily.   sertraline 100 MG tablet Commonly known as:  ZOLOFT Take 150 mg by mouth daily.   sulfamethoxazole-trimethoprim 800-160 MG tablet Commonly known as:  BACTRIM DS,SEPTRA DS Take 1 tablet by mouth 2 (two) times daily.   traZODone 50 MG tablet Commonly known as:  DESYREL Take 50 mg by mouth at bedtime.      Verbal and written Discharge instructions given to the patient. Wound care per Discharge AVS F/U with Dr. Randie Heinzain in 4 weeks for staple removal Left BKA  Signed: Clinton GallantCOLLINS, EMMA [facility] 07/26/2017, 8:02 AM

## 2017-07-27 LAB — GLUCOSE, CAPILLARY
Glucose-Capillary: 309 mg/dL — ABNORMAL HIGH (ref 65–99)
Glucose-Capillary: 248 mg/dL — ABNORMAL HIGH (ref 65–99)

## 2017-07-27 NOTE — Progress Notes (Signed)
Physical Therapy Treatment Patient Details Name: Danielle Buchanan MRN: [id number] DOB: 01/30/1956 Today's Date: 07/27/2017    History of Present Illness Patient is a 61 yo female admitted 07/23/17 with pain in Lt foot.  Patient with non-healing wounds, now s/p Lt BKA on 07/23/17.    PMH:  PVK, KM, chronic foot ulcers, depression, HTN, HLD, OSA    PT Comments    Pt is making good progress towards her goals. Pt is limited in her mobility by decreased UE strength needed to progress her R LE s/p L BKA. Pt is currently min guard for bed mobility, minAx2 for transfers and modAx2 for ambulation of 8 feet with RW. Pt requires skilled PT to progress mobility and improve strength and endurance to safely navigate their discharge environment.     Follow Up Recommendations  SNF     Equipment Recommendations   (to be determined at next venue)    Recommendations for Other Services Rehab consult;OT consult     Precautions / Restrictions Precautions Precautions: Fall Precaution Comments: New Lt BKA Restrictions Weight Bearing Restrictions: Yes LLE Weight Bearing: Non weight bearing    Mobility  Bed Mobility Overal bed mobility: Needs Assistance Bed Mobility: Rolling;Sidelying to Sit;Sit to Supine Rolling: Supervision (use of rail and HOB elevated) Sidelying to sit: HOB elevated;Min guard       General bed mobility comments: use of rail; cues for sequencing; min guard for safety  Transfers Overall transfer level: Needs assistance Equipment used: Rolling walker (2 wheeled) Transfers: Sit to/from [employer] Sit to Stand: Min assist;+2 safety/equipment Stand pivot transfers: Min assist;+2 physical assistance       General transfer comment: minAx2 for sit>stand and stand pivot to BSC, vc for incrreased UE support to progress R LE   Ambulation/Gait Ambulation/Gait assistance: Mod assist;+2 safety/equipment Ambulation Distance (Feet): 8 Feet Assistive device: Rolling  walker (2 wheeled) Gait Pattern/deviations:  (hop-to pattern) Gait velocity: slowed Gait velocity interpretation: Below normal speed for age/gender General Gait Details: cues for sequencing, posture, proximity of AD, and technique       Balance Overall balance assessment: Needs assistance Sitting-balance support: No upper extremity supported (RLE supported) Sitting balance-Leahy Scale: Fair     Standing balance support: Bilateral upper extremity supported Standing balance-Leahy Scale: Poor Standing balance comment: reliant on RW for standing                            Cognition Arousal/Alertness: Awake/alert Behavior During Therapy: WFL for tasks assessed/performed Overall Cognitive Status: Within Functional Limits for tasks assessed                                 General Comments: pt is a little impulsive and needs cues to wait for therapist to stand      Exercises Amputee Exercises Hip Flexion/Marching: AROM;Left;10 reps;Supine Knee Flexion: AROM;Left;10 reps Knee Extension: AROM;Left;Seated;10 reps    General Comments        Pertinent Vitals/Pain Pain Assessment: 0-10 Faces Pain Scale: Hurts whole lot Pain Location: L LE with knee flexion Pain Descriptors / Indicators: Sore Pain Intervention(s): Limited activity within patient's tolerance;Monitored during session;Repositioned           PT Goals (current goals can now be found in the care plan section) Acute Rehab PT Goals Patient Stated Goal: to decrease pain and be as independent as possible again PT Goal Formulation: With  patient/family Time For Goal Achievement: 07/31/17 Potential to Achieve Goals: Good Progress towards PT goals: Progressing toward goals    Frequency    Min 4X/week      PT Plan Discharge plan needs to be updated       AM-PAC PT "6 Clicks" Daily Activity  Outcome Measure  Difficulty turning over in bed (including adjusting bedclothes, sheets and  blankets)?: Unable Difficulty moving from lying on back to sitting on the side of the bed? : Unable Difficulty sitting down on and standing up from a chair with arms (e.g., wheelchair, bedside commode, etc,.)?: Unable Help needed moving to and from a bed to chair (including a wheelchair)?: A Lot Help needed walking in hospital room?: A Lot Help needed climbing 3-5 steps with a railing? : Total 6 Click Score: 8    End of Session Equipment Utilized During Treatment: Gait belt Activity Tolerance: Patient tolerated treatment well Patient left: with call bell/phone within reach;in bed;with family/visitor present Nurse Communication: Mobility status PT Visit Diagnosis: Difficulty in walking, not elsewhere classified (R26.2);Pain Pain - Right/Left: Left Pain - part of body: Leg     Time: [id number] PT Time Calculation (min) (ACUTE ONLY): 25 min  Charges:  $Therapeutic Exercise: 8-22 mins $Therapeutic Activity: 8-22 mins                    G Codes:       Ahsha Hinsley B. Beverely RisenVan Buchanan PT, DPT Acute Rehabilitation  [phone] Pager [phone]     Danielle Buchanan 07/27/2017, 4:34 PM

## 2017-07-27 NOTE — Progress Notes (Signed)
Medication Lyrica 25mg  pulled from pyxis and medication not in package. Pharmacy advised to write progress note. Another 25mg  of Lyrica was pulled from the pyxis. Count correct  Audie BoxMarissa Gabriele Zwilling, RN

## 2017-07-28 LAB — GLUCOSE, CAPILLARY
GLUCOSE-CAPILLARY: 275 mg/dL — AB (ref 65–99)
GLUCOSE-CAPILLARY: 251 mg/dL — AB (ref 65–99)
Glucose-Capillary: 245 mg/dL — ABNORMAL HIGH (ref 65–99)

## 2017-07-28 NOTE — Progress Notes (Signed)
Clinical Social Worker met patient at bedside to follow up with patient/family about disposition plan. CSW called patients nephew [name] and placed him on speaker phone with patients verbal permission. [name] stated he would prefer patient to discharge to [facility]. CSW stated to patient/family that authorization from insurance company would need to be attained before being able to discharge to facility. CSW also stated that PASSRr number from the stated would need to be given before discharge. CSW has requested PASSR from the stated but patients case is under review. CSW to follow up with patient for any update on disposition plan.   Rhea Pink, MSW,  [place]

## 2017-07-28 NOTE — Progress Notes (Signed)
Patient was reaching for the water on her tray. Scooted along the bed and slid off onto the floor. VSS. Patient stated that,"top of her stump really hurt". Patient was asked if she used the call bell. Patient stated, "she had called out". Patient also stated that it takes too long for staff to get to her room when she calls. Staff was sitting outside her room. Notified MD (Brabham) at [phone]. No orders given. Morphine given. Patient denies hitting her head just her stump. Patient alert and oriented. Will continue to monitor.

## 2017-07-28 NOTE — Progress Notes (Signed)
Pt pulled IV out and stated she is not having a new one placed since she is leaving tomorrow. This nurse also attempted to educate pt on diet and healthy eating with DM. Did not go so well, pts mom snuck in 2 sandwiches from [employer] today, also a medium sized bag of chips was on the bedside table(empty). Pt ate a cheese burger with fries for dinner, BS 344 insulin coverage given.

## 2017-07-28 NOTE — Progress Notes (Signed)
  Progress Note    07/28/2017 8:40 AM 5 Days Post-Op  Subjective:  Says she fell last night and has some pain on the front and bottom of her stump  Tm 99.8 HR 70's-90's NSR 120's-140's systolic 94%  Vitals:   07/28/17 0100 07/28/17 0344  BP:  (!) 148/77  Pulse: 96 89  Resp: 18   Temp:  99.8 F (37.7 C)  SpO2: 91% 91%    Physical Exam: Incisions:  Mild serosanguinous drainage from the lateral posterior portion of incision.        CBC    Component Value Date/Time   WBC 10.8 (H) 07/24/2017 0250   RBC 4.26 07/24/2017 0250   HGB 12.1 07/24/2017 0250   HCT 37.0 07/24/2017 0250   PLT 202 07/24/2017 0250   MCV 86.9 07/24/2017 0250   MCH 28.4 07/24/2017 0250   MCHC 32.7 07/24/2017 0250   RDW 13.8 07/24/2017 0250   LYMPHSABS 1.9 07/12/2017 1605   MONOABS 0.5 07/12/2017 1605   EOSABS 0.4 07/12/2017 1605   BASOSABS 0.1 07/12/2017 1605    BMET    Component Value Date/Time   NA 133 (L) 07/24/2017 0250   K 4.9 07/24/2017 0250   CL 99 (L) 07/24/2017 0250   CO2 23 07/24/2017 0250   GLUCOSE 348 (H) 07/24/2017 0250   BUN 10 07/24/2017 0250   CREATININE 0.73 07/24/2017 0250   CALCIUM 8.6 (L) 07/24/2017 0250   GFRNONAA >60 07/24/2017 0250   GFRAA >60 07/24/2017 0250    INR    Component Value Date/Time   INR 0.92 06/09/2017 2105    No intake or output data in the 24 hours ending 07/28/17 0840   Assessment/Plan:  61 y.o. female is s/p left below knee amputation  5 Days Post-Op  -pt fell early this am and now she has pain in her stump.  There is some bruising (see picture above) -will continue to monitor   Doreatha MassedSamantha Rhyne, PA-C [facility] [phone] 07/28/2017 8:40 AM     Agree with the above.  Patient had a fall last night.  Wound inspected.  There is some new bruising.  Continue with routine dressing changes to  Keep stump dry.  Durene CalWells Brabham

## 2017-07-28 NOTE — Progress Notes (Signed)
   07/28/17 0023  What Happened  Was fall witnessed? No  Was patient injured? No  Patient found on floor  Found by Staff-comment  Stated prior activity other (comment) (pt reaching for water/slid off the bed to floor)  Follow Up  MD notified Brabham  Time MD notified [phone]  Family notified Yes-comment (patient called family)  Time family notified 0055  Additional tests No  Simple treatment Dressing (rewrapped stump and ulcer on right foot)  Vitals  Temp 98.1 F (36.7 C)  Temp Source Oral  BP 126/60  BP Location Left Arm  BP Method Automatic  ECG Heart Rate 80  Oxygen Therapy  SpO2 93 %

## 2017-07-29 ENCOUNTER — Telehealth: Payer: Self-pay | Admitting: Vascular Surgery

## 2017-07-29 LAB — GLUCOSE, CAPILLARY
GLUCOSE-CAPILLARY: 284 mg/dL — AB (ref 65–99)
GLUCOSE-CAPILLARY: 325 mg/dL — AB (ref 65–99)
Glucose-Capillary: 365 mg/dL — ABNORMAL HIGH (ref 65–99)
Glucose-Capillary: 264 mg/dL — ABNORMAL HIGH (ref 65–99)

## 2017-07-29 NOTE — Telephone Encounter (Signed)
Sched appt 09/06/17 at 10:30. Spoke to sister.

## 2017-07-29 NOTE — Care Management Important Message (Signed)
Important Message  Patient Details  Name: Danielle Buchanan MRN: [id number] Date of Birth: 21-Apr-1956   Medicare Important Message Given:  Yes    Jameel Quant Abena 07/29/2017, 11:21 AM

## 2017-07-29 NOTE — Consult Note (Signed)
   [facility] L[facility] CM Inpatient Consult   07/29/2017  Danielle ClassJanice Buchanan 01-20-1956 [id number]  [facility].  Chart review reveals the patient is being recommended for skilled nursing care.  Patient is a s/p BKA.  No current community care management needs noted at this time.  For questions, please contact:  Charlesetta ShanksVictoria Decklyn Hornik, RN BSN CCM [facility] Liaison  [phone] business mobile phone Toll free office [phone]

## 2017-07-29 NOTE — Progress Notes (Signed)
Pt needs 2 person assist and walker to get to BCS and chair. Patient ambulated about 8 ft to sink and needed to sit down and rest. Pt experiencing some pain related to her BKA that is interfering with mobility.   Danielle StarksHanna  Zohal Reny, RN

## 2017-07-29 NOTE — Telephone Encounter (Signed)
-----   Message from Sharee PimpleMarilyn K McChesney, RN sent at 07/29/2017  9:01 AM EDT ----- Regarding: 4 weeks post left BKA   ----- Message ----- From: Lars Mageollins, Emma M, PA-C Sent: 07/29/2017   7:40 AM To: Vvs Charge Pool  F/U with Dr. Randie Heinzain in 4 weeks s/p left BKA

## 2017-07-29 NOTE — Progress Notes (Signed)
Chart reviewed for LOS; B Jodeen Mclin RN,MHA,BSN [phone] 

## 2017-07-29 NOTE — Progress Notes (Signed)
THIS NURSE SPOKE WITH EMMA COLLINS PA TO GET AN ORDER TO CANCEL THE DISCHARGE ORDERS FOR THE PT. ALSO SPOKE WITH CAITLIN RN, AND SHE TO COULD NOT FIND THE ACTIVE ORDER FOR DISCHARGE. PT WILL BE READY FOR DISCHARGE ON 10/23 AFTER AUTHORIZATION FROM [facility] IN [place].

## 2017-07-29 NOTE — Progress Notes (Signed)
Occupational Therapy Treatment Patient Details Name: Danielle Buchanan MRN: [id number] DOB: 1956/07/28 Today's Date: 07/29/2017    History of present illness Patient is a 61 yo female admitted 07/23/17 with pain in Lt foot.  Patient with non-healing wounds, now s/p Lt BKA on 07/23/17.    PMH:  PVK, KM, chronic foot ulcers, depression, HTN, HLD, OSA   OT comments  Pt motivated to participate with OT this session. Pt seen in conjunction with PTA in order to maximize functional gains toward toileting goals and transfers. Pt was able to initiate simulated toilet transfer with mod assist +2 for power up and min assist +2 for safety once on her feet. Pt able to ambulate approximately 3 feet; however, limited by fatigue and requiring guidance onto bed for safety. Feel pt most appropriate for SNF level rehabilitation at this time and updated follow-up recommendations. OT will continue to follow while admitted.    Follow Up Recommendations  SNF    Equipment Recommendations  Tub/shower bench (drop arm 3-in-1)    Recommendations for Other Services      Precautions / Restrictions Precautions Precautions: Fall Precaution Comments: New Lt BKA Restrictions Weight Bearing Restrictions: Yes LLE Weight Bearing: Non weight bearing       Mobility Bed Mobility Overal bed mobility: Needs Assistance Bed Mobility: Rolling;Sidelying to Sit;Sit to Supine Rolling: Supervision (use of rail and HOB elevated) Sidelying to sit: HOB elevated;Supervision Supine to sit: Mod assist;HOB elevated Sit to supine: Max assist;+2 for physical assistance Sit to sidelying: Mod assist General bed mobility comments: supervision for safety with coming into sitting and use of rail; pt required max A to return to supine as pt began sitting prematurely and required guidance onto EOB; pt positioned with bed pad and able to scoot up into bed with supervision  Transfers Overall transfer level: Needs assistance Equipment used:  Rolling walker (2 wheeled) Transfers: Sit to/from Stand Sit to Stand: Mod assist;+2 physical assistance         General transfer comment: Mod assist to power up into standing and min assist to maintain balance. VC's for safe hand placement.     Balance Overall balance assessment: Needs assistance Sitting-balance support: No upper extremity supported (R LE supported) Sitting balance-Leahy Scale: Fair Sitting balance - Comments: pt able to sit EOB and don sock Postural control: Posterior lean Standing balance support: Bilateral upper extremity supported Standing balance-Leahy Scale: Poor Standing balance comment: reliant on RW for standing                           ADL either performed or assessed with clinical judgement   ADL Overall ADL's : Needs assistance/impaired Eating/Feeding: Independent;Sitting                       Toilet Transfer: Moderate assistance;Maximal assistance;+2 for physical assistance Toilet Transfer Details (indicate cue type and reason): Mod assist +2 to rise from bed. Able to complete 2 hops forward with min-mod assist +2. However required emergent sitting with max assist from PTA and OT to return pt safely to bed.           General ADL Comments: Pt reports that she is having difficulty remembering that her L leg has been amputated.      Vision       Perception     Praxis      Cognition Arousal/Alertness: Awake/alert Behavior During Therapy: WFL for tasks assessed/performed;Impulsive Overall Cognitive Status:  Within Functional Limits for tasks assessed                                 General Comments: Pt impulsive with returning to seated position making this process unsafe.         Exercises     Shoulder Instructions       General Comments pt educated on L LE positioning    Pertinent Vitals/ Pain       Pain Assessment: Faces Faces Pain Scale: Hurts whole lot Pain Location: L LE Pain Descriptors  / Indicators: Aching;Constant;Pressure Pain Intervention(s): Limited activity within patient's tolerance;Monitored during session;Repositioned;Patient requesting pain meds-RN notified  Home Living                                          Prior Functioning/Environment              Frequency  Min 3X/week        Progress Toward Goals  OT Goals(current goals can now be found in the care plan section)  Progress towards OT goals: Progressing toward goals  Acute Rehab OT Goals Patient Stated Goal: to decrease pain and be as independent as possible again OT Goal Formulation: With patient Time For Goal Achievement: 08/08/17 Potential to Achieve Goals: Good  Plan Discharge plan needs to be updated    Co-evaluation                 AM-PAC PT "6 Clicks" Daily Activity     Outcome Measure   Help from another person eating meals?: None Help from another person taking care of personal grooming?: A Little Help from another person toileting, which includes using toliet, bedpan, or urinal?: A Lot Help from another person bathing (including washing, rinsing, drying)?: A Lot Help from another person to put on and taking off regular upper body clothing?: A Little Help from another person to put on and taking off regular lower body clothing?: A Lot 6 Click Score: 16    End of Session Equipment Utilized During Treatment: Gait belt;Rolling walker  OT Visit Diagnosis: Unsteadiness on feet (R26.81);Other abnormalities of gait and mobility (R26.89);Muscle weakness (generalized) (M62.81);Pain Pain - Right/Left: Left Pain - part of body: Leg   Activity Tolerance Patient tolerated treatment well   Patient Left in bed;with call bell/phone within reach;with bed alarm set   Nurse Communication Mobility status (recommend bed alarm)        Time: [id number] OT Time Calculation (min): 24 min  Charges: OT General Charges $OT Visit: 1 Visit OT Treatments $Self  Care/Home Management : 8-22 mins  Danielle Sectionharity A Melynda Krzywicki, MS OTR/L  Pager: [phone]    Karianna Gusman A Ruqayyah Lute 07/29/2017, 12:30 PM

## 2017-07-29 NOTE — Discharge Summary (Addendum)
[facility] Discharge Summary   Patient ID:  Danielle Buchanan MRN: [id number] DOB/AGE: [birth year] 61 y.o.  Admit date: 07/23/2017 Discharge date: 07/30/2017 Date of Surgery: 07/23/2017 Surgeon: Surgeon(s): Maeola Harmanain, Brandon Christopher, MD  Admission Diagnosis: nonviable tissue left foot  Discharge Diagnoses:  nonviable tissue left foot  Secondary Diagnoses: Past Medical History:  Diagnosis Date  . Allergic rhinitis   . Depression   . Diabetes (HCC)   . Dyslipidemia   . HTN (hypertension)   . Insomnia   . OSA (obstructive sleep apnea)    no cpap  . Urinary incontinence     Procedure(s): AMPUTATION  BELOW KNEE  Discharged Condition: good  HPI: Danielle Buchanan is a 61 y.o. female here for evaluation of her bilateral feet. She underwent angiogram recently is demonstrated a reconstructive disease. She has quit smoking frankly. She is being seen in the wound center and considered for hyperbaric. She is having significant pain in the left foot where she has progressive gangrene. She is not having any fevers and not on antibiotics at this time. Reviewed previous angiogram without reconstructable disease.   Hospital Course:  Danielle Buchanan is a 61 y.o. female is S/P Left Procedure(s): AMPUTATION  BELOW KNEE Increased mobility with time, unfortunate that she feel on her stump 07/28/2017.  With ecchymosis verse demarcation medial and laterally.  Right foot ulcer santyl dressing changes daily.  Discharge plan for SNF rehab.  Staples will remain in place for 4 weeks from surgery date.  Compression sleeve in room for BKA.    61 y.o.femaleis s/p left belowknee amputation 7Days Post-Op Lateral stump SS drainage, pack with dry guaze daily.  Fat necrosis. She is afebrile. Plan for discharge to SNF once insurance is in order. F/U with Dr. Randie Heinzain in 2 weeks. Started Keflex 500 mg TID for 2 weeks.    Significant Diagnostic Studies: CBC Lab Results  Component Value  Date   WBC 10.8 (H) 07/24/2017   HGB 12.1 07/24/2017   HCT 37.0 07/24/2017   MCV 86.9 07/24/2017   PLT 202 07/24/2017    BMET    Component Value Date/Time   NA 133 (L) 07/24/2017 0250   K 4.9 07/24/2017 0250   CL 99 (L) 07/24/2017 0250   CO2 23 07/24/2017 0250   GLUCOSE 348 (H) 07/24/2017 0250   BUN 10 07/24/2017 0250   CREATININE 0.73 07/24/2017 0250   CALCIUM 8.6 (L) 07/24/2017 0250   GFRNONAA >60 07/24/2017 0250   GFRAA >60 07/24/2017 0250   COAG Lab Results  Component Value Date   INR 0.92 06/09/2017     Disposition:  Discharge to :Skilled nursing facility Discharge Instructions    Call MD for:  redness, tenderness, or signs of infection (pain, swelling, bleeding, redness, odor or green/yellow discharge around incision site)    Complete by:  As directed    Call MD for:  severe or increased pain, loss or decreased feeling  in affected limb(s)    Complete by:  As directed    Call MD for:  temperature >100.5    Complete by:  As directed    Discharge instructions    Complete by:  As directed    May shower daily PRN   Discharge patient    Complete by:  As directed    Discharge disposition:  03-Skilled Nursing Facility   Discharge patient date:  07/29/2017   Resume previous diet    Complete by:  As directed      Allergies as of  07/29/2017      Reactions   Doxycycline Rash      Medication List    TAKE these medications   amitriptyline 25 MG tablet Commonly known as:  ELAVIL Take 25 mg by mouth at bedtime.   cetirizine 10 MG tablet Commonly known as:  ZYRTEC Take 10 mg by mouth daily.   [employer] NICOTINE TRANSDERMAL SYS 14 mg/24hr patch Generic drug:  nicotine Apply 1 patch daily as needed for smoking cessation   gabapentin 300 MG capsule Commonly known as:  NEURONTIN Take 300 mg by mouth 3 (three) times daily as needed (pain).   HYDROcodone-acetaminophen 5-325 MG tablet Commonly known as:  NORCO/VICODIN Take 1 tablet by mouth 3 (three) times daily.    insulin NPH-regular Human (70-30) 100 UNIT/ML injection Commonly known as:  NOVOLIN 70/30 120 units with breakfast, and 90 units with the evening meal What changed:  how much to take  how to take this  when to take this  additional instructions   levofloxacin 500 MG tablet Commonly known as:  LEVAQUIN Take 1 tablet (500 mg total) by mouth daily.   losartan 100 MG tablet Commonly known as:  COZAAR Take 100 mg by mouth daily.   mupirocin cream 2 % Commonly known as:  BACTROBAN Apply 1 application topically daily as needed (sores).   oxyCODONE-acetaminophen 5-325 MG tablet Commonly known as:  PERCOCET/ROXICET Take 1-2 tablets by mouth every 6 (six) hours as needed for severe pain.   pravastatin 20 MG tablet Commonly known as:  PRAVACHOL Take 20 mg by mouth daily.   pregabalin 75 MG capsule Commonly known as:  LYRICA Take 75 mg by mouth 2 (two) times daily.   sertraline 100 MG tablet Commonly known as:  ZOLOFT Take 150 mg by mouth daily.   sulfamethoxazole-trimethoprim 800-160 MG tablet Commonly known as:  BACTRIM DS,SEPTRA DS Take 1 tablet by mouth 2 (two) times daily.   traZODone 50 MG tablet Commonly known as:  DESYREL Take 50 mg by mouth at bedtime.      Verbal and written Discharge instructions given to the patient. Wound care per Discharge AVS  F/ U with [name] in 4 weeks from surgery  Signed: Clinton Gallant [facility] 07/29/2017, 7:40 AM

## 2017-07-29 NOTE — [place] FL2 (Signed)
[place] MEDICAID FL2 LEVEL OF CARE SCREENING TOOL     IDENTIFICATION  Patient Name: Danielle Buchanan Birthdate: Nov 05, 1955 Sex: female Admission Date (Current Location): 07/23/2017  [facility] [place]   Facility and Address:  [facility] [place], [address], [place], [place] [address]      Provider Number: [id number]  Attending Physician Name and Address:  Juventino Slovakain, Brandon Christophe*  Relative Name and Phone Number:       Current Level of Care: Hospital Recommended Level of Care: Skilled Nursing Facility Prior Approval Number:    Date Approved/Denied:   PASRR Number: Manual review  Discharge Plan: SNF    Current Diagnoses: Patient Active Problem List   Diagnosis Date Noted  . PAD (peripheral artery disease) (HCC) 07/23/2017  . Cellulitis 06/10/2017  . Hyponatremia 06/10/2017  . Fever 06/10/2017  . Rash 05/24/2017  . Diabetes (HCC)   . HTN (hypertension)   . Allergic rhinitis   . Insomnia   . Depression     Orientation RESPIRATION BLADDER Height & Weight     Self, Time, Situation, Place  Normal Continent, External catheter Weight: 234 lb (106.1 kg) Height:  5\' 1"  (154.9 cm)  BEHAVIORAL SYMPTOMS/MOOD NEUROLOGICAL BOWEL NUTRITION STATUS   (None)  (None) Continent Diet (Carb modified)  AMBULATORY STATUS COMMUNICATION OF NEEDS Skin   Limited Assist Verbally Surgical wounds, Other (Comment) (Amputation. Diabetic ulcer: right and left toe.)                       Personal Care Assistance Level of Assistance  Bathing, Feeding, Dressing Bathing Assistance: Limited assistance Feeding assistance: Independent Dressing Assistance: Maximum assistance     Functional Limitations Info  Sight, Hearing, Speech Sight Info: Adequate Hearing Info: Adequate Speech Info: Adequate    SPECIAL CARE FACTORS FREQUENCY  PT (By licensed PT), OT (By licensed OT)     PT Frequency: 5 x week OT Frequency: 5 x week             Contractures Contractures Info: Not present    Additional Factors Info  Code Status, Allergies, Psychotropic Code Status Info: Full Allergies Info: Doxycycline Psychotropic Info: Depression: Zoloft 150 mg PO daily, Trazodone 50 mg PO QHS.         Current Medications (07/29/2017):  This is the current hospital active medication list Current Facility-Administered Medications  Medication Dose Route Frequency Provider Last Rate Last Dose  . 0.9 % NaCl with KCl 20 mEq/ L  infusion   Intravenous Continuous Maeola Harmanain, Brandon Christopher, MD 100 mL/hr at 07/23/17 2142    . alum & mag hydroxide-simeth (MAALOX/MYLANTA) 200-200-20 MG/5ML suspension 15-30 mL  15-30 mL Oral Q2H PRN Maeola Harmanain, Brandon Christopher, MD      . amitriptyline (ELAVIL) tablet 25 mg  25 mg Oral QHS Maeola Harmanain, Brandon Christopher, MD   25 mg at 07/28/17 2239  . collagenase (SANTYL) ointment   Topical Daily Maeola Harmanain, Brandon Christopher, MD      . gabapentin (NEURONTIN) capsule 300 mg  300 mg Oral TID PRN Maeola Harmanain, Brandon Christopher, MD   300 mg at 07/27/17 1949  . guaiFENesin-dextromethorphan (ROBITUSSIN DM) 100-10 MG/5ML syrup 15 mL  15 mL Oral Q4H PRN Maeola Harmanain, Brandon Christopher, MD      . heparin injection 5,000 Units  5,000 Units Subcutaneous Q8H Maeola Harmanain, Brandon Christopher, MD   5,000 Units at 07/29/17 [id number]  . hydrALAZINE (APRESOLINE) injection 5 mg  5 mg Intravenous Q20 Min PRN Lemar Livingsain, Brandon  Cristal Deer, MD   5 mg at 07/24/17 1851  . insulin aspart (novoLOG) injection 0-15 Units  0-15 Units Subcutaneous TID WC Lars Mage, PA-C   11 Units at 07/29/17 1118  . insulin aspart (novoLOG) injection 0-5 Units  0-5 Units Subcutaneous QHS Lars Mage, PA-C   2 Units at 07/28/17 2244  . insulin aspart protamine- aspart (NOVOLOG MIX 70/30) injection 23 Units  23 Units Subcutaneous BID WC Lars Mage, PA-C   23 Units at 07/29/17 1610  . labetalol (NORMODYNE,TRANDATE) injection 10 mg  10 mg Intravenous Q10 min PRN Maeola Harman,  MD   10 mg at 07/25/17 0559  . loratadine (CLARITIN) tablet 10 mg  10 mg Oral Daily Maeola Harman, MD   10 mg at 07/29/17 9604  . losartan (COZAAR) tablet 100 mg  100 mg Oral Daily Maeola Harman, MD   100 mg at 07/29/17 5409  . metoprolol tartrate (LOPRESSOR) injection 2-5 mg  2-5 mg Intravenous Q2H PRN Maeola Harman, MD      . morphine 2 MG/ML injection 2-5 mg  2-5 mg Intravenous Q1H PRN Maeola Harman, MD   4 mg at 07/28/17 1302  . ondansetron (ZOFRAN) injection 4 mg  4 mg Intravenous Q6H PRN Maeola Harman, MD      . oxyCODONE-acetaminophen (PERCOCET/ROXICET) 5-325 MG per tablet 1-2 tablet  1-2 tablet Oral Q4H PRN Maeola Harman, MD   2 tablet at 07/29/17 8119  . pantoprazole (PROTONIX) EC tablet 40 mg  40 mg Oral Daily Maeola Harman, MD   40 mg at 07/29/17 0820  . phenol (CHLORASEPTIC) mouth spray 1 spray  1 spray Mouth/Throat PRN Maeola Harman, MD      . potassium chloride SA (K-DUR,KLOR-CON) CR tablet 20-40 mEq  20-40 mEq Oral Once Maeola Harman, MD      . pravastatin (PRAVACHOL) tablet 20 mg  20 mg Oral QHS Maeola Harman, MD   20 mg at 07/28/17 2238  . pregabalin (LYRICA) capsule 75 mg  75 mg Oral BID Maeola Harman, MD   75 mg at 07/29/17 0820  . sertraline (ZOLOFT) tablet 150 mg  150 mg Oral Daily Maeola Harman, MD   150 mg at 07/29/17 0820  . traZODone (DESYREL) tablet 50 mg  50 mg Oral QHS Maeola Harman, MD   50 mg at 07/28/17 2239     Discharge Medications: Please see discharge summary for a list of discharge medications.  Relevant Imaging Results:  Relevant Lab Results:   Additional Information SS#: [id number]  Althea Charon, LCSW

## 2017-07-29 NOTE — Progress Notes (Addendum)
[facility] [place]  Subjective  - Increased pain in stump.   Objective (!) 148/79 84 98.5 F (36.9 C) (Oral) 11 95%  Intake/Output Summary (Last 24 hours) at 07/29/17 0725 Last data filed at 07/28/17 2330  Gross per 24 hour  Intake              880 ml  Output              600 ml  Net              280 ml    Left stump with darkness medial and lateral.  Increased pain with dressing removal this am. Right foot no change Heart RRR Lungs non labored breathing  Assessment/Planning: 61 y.o. female is s/p left below knee amputation  6 Days Post-Op  She feel yesterday and hit the stump, not sure if this represents bruising or demarcation of the stump. See picture on note from 07/28/2017  Clinton GallantCOLLINS, EMMA [facility] 07/29/2017 7:25 AM --  Laboratory Lab Results: No results for input(s): WBC, HGB, HCT, PLT in the last 72 hours. BMET No results for input(s): NA, K, CL, CO2, GLUCOSE, BUN, CREATININE, CALCIUM in the last 72 hours.  COAG Lab Results  Component Value Date   INR 0.92 06/09/2017   No results found for: PTT   I have independently interviewed and examined patient and agree with PA assessment. Bruising present on residual limb but hopefully will heal. Ok for snf when bed available  Kambria Grima C. Randie Heinzain, MD [facility] [place] Office: [phone] Pager: [phone]

## 2017-07-29 NOTE — [place] FL2 (Signed)
[place] MEDICAID FL2 LEVEL OF CARE SCREENING TOOL     IDENTIFICATION  Patient Name: Danielle Buchanan Birthdate: 11-Nov-1955 Sex: female Admission Date (Current Location): 07/23/2017  [facility] [place]   Facility and Address:  [facility] [place], [address], [place], [place] [address]      Provider Number: [id number]  Attending Physician Name and Address:  Juventino Slovakain, Brandon Christophe*  Relative Name and Phone Number:       Current Level of Care: Hospital Recommended Level of Care: Skilled Nursing Facility Prior Approval Number:    Date Approved/Denied:   PASRR Number: [id number] [id number]  Discharge Plan: SNF    Current Diagnoses: Patient Active Problem List   Diagnosis Date Noted  . PAD (peripheral artery disease) (HCC) 07/23/2017  . Cellulitis 06/10/2017  . Hyponatremia 06/10/2017  . Fever 06/10/2017  . Rash 05/24/2017  . Diabetes (HCC)   . HTN (hypertension)   . Allergic rhinitis   . Insomnia   . Depression     Orientation RESPIRATION BLADDER Height & Weight     Self, Time, Situation, Place  Normal Continent, External catheter Weight: 234 lb (106.1 kg) Height:  5\' 1"  (154.9 cm)  BEHAVIORAL SYMPTOMS/MOOD NEUROLOGICAL BOWEL NUTRITION STATUS   (None)  (None) Continent Diet (Carb modified)  AMBULATORY STATUS COMMUNICATION OF NEEDS Skin   Limited Assist Verbally Surgical wounds, Other (Comment) (Amputation. Diabetic ulcer: right and left toe.)                       Personal Care Assistance Level of Assistance  Bathing, Feeding, Dressing Bathing Assistance: Limited assistance Feeding assistance: Independent Dressing Assistance: Maximum assistance     Functional Limitations Info  Sight, Hearing, Speech Sight Info: Adequate Hearing Info: Adequate Speech Info: Adequate    SPECIAL CARE FACTORS FREQUENCY  PT (By licensed PT), OT (By licensed OT)     PT Frequency: 5 x week OT Frequency: 5 x week             Contractures Contractures Info: Not present    Additional Factors Info  Code Status, Allergies, Psychotropic Code Status Info: Full Allergies Info: Doxycycline Psychotropic Info: Depression: Zoloft 150 mg PO daily, Trazodone 50 mg PO QHS.         Current Medications (07/29/2017):  This is the current hospital active medication list Current Facility-Administered Medications  Medication Dose Route Frequency Provider Last Rate Last Dose  . 0.9 % NaCl with KCl 20 mEq/ L  infusion   Intravenous Continuous Maeola Harmanain, Brandon Christopher, MD 100 mL/hr at 07/23/17 2142    . alum & mag hydroxide-simeth (MAALOX/MYLANTA) 200-200-20 MG/5ML suspension 15-30 mL  15-30 mL Oral Q2H PRN Maeola Harmanain, Brandon Christopher, MD      . amitriptyline (ELAVIL) tablet 25 mg  25 mg Oral QHS Maeola Harmanain, Brandon Christopher, MD   25 mg at 07/28/17 2239  . collagenase (SANTYL) ointment   Topical Daily Maeola Harmanain, Brandon Christopher, MD      . gabapentin (NEURONTIN) capsule 300 mg  300 mg Oral TID PRN Maeola Harmanain, Brandon Christopher, MD   300 mg at 07/27/17 1949  . guaiFENesin-dextromethorphan (ROBITUSSIN DM) 100-10 MG/5ML syrup 15 mL  15 mL Oral Q4H PRN Maeola Harmanain, Brandon Christopher, MD      . heparin injection 5,000 Units  5,000 Units Subcutaneous Q8H Maeola Harmanain, Brandon Christopher, MD   5,000 Units at 07/29/17 1309  . hydrALAZINE (APRESOLINE) injection 5 mg  5 mg Intravenous Q20 Min PRN Maeola Harmanain, Brandon Christopher,  MD   5 mg at 07/24/17 1851  . insulin aspart (novoLOG) injection 0-15 Units  0-15 Units Subcutaneous TID WC Lars Mage, PA-C   11 Units at 07/29/17 1118  . insulin aspart (novoLOG) injection 0-5 Units  0-5 Units Subcutaneous QHS Lars Mage, PA-C   2 Units at 07/28/17 2244  . insulin aspart protamine- aspart (NOVOLOG MIX 70/30) injection 23 Units  23 Units Subcutaneous BID WC Lars Mage, PA-C   23 Units at 07/29/17 1610  . labetalol (NORMODYNE,TRANDATE) injection 10 mg  10 mg Intravenous Q10 min PRN Maeola Harman,  MD   10 mg at 07/25/17 0559  . loratadine (CLARITIN) tablet 10 mg  10 mg Oral Daily Maeola Harman, MD   10 mg at 07/29/17 9604  . losartan (COZAAR) tablet 100 mg  100 mg Oral Daily Maeola Harman, MD   100 mg at 07/29/17 5409  . metoprolol tartrate (LOPRESSOR) injection 2-5 mg  2-5 mg Intravenous Q2H PRN Maeola Harman, MD      . morphine 2 MG/ML injection 2-5 mg  2-5 mg Intravenous Q1H PRN Maeola Harman, MD   4 mg at 07/28/17 1302  . ondansetron (ZOFRAN) injection 4 mg  4 mg Intravenous Q6H PRN Maeola Harman, MD      . oxyCODONE-acetaminophen (PERCOCET/ROXICET) 5-325 MG per tablet 1-2 tablet  1-2 tablet Oral Q4H PRN Maeola Harman, MD   1 tablet at 07/29/17 1201  . pantoprazole (PROTONIX) EC tablet 40 mg  40 mg Oral Daily Maeola Harman, MD   40 mg at 07/29/17 0820  . phenol (CHLORASEPTIC) mouth spray 1 spray  1 spray Mouth/Throat PRN Maeola Harman, MD      . potassium chloride SA (K-DUR,KLOR-CON) CR tablet 20-40 mEq  20-40 mEq Oral Once Maeola Harman, MD      . pravastatin (PRAVACHOL) tablet 20 mg  20 mg Oral QHS Maeola Harman, MD   20 mg at 07/28/17 2238  . pregabalin (LYRICA) capsule 75 mg  75 mg Oral BID Maeola Harman, MD   75 mg at 07/29/17 0820  . sertraline (ZOLOFT) tablet 150 mg  150 mg Oral Daily Maeola Harman, MD   150 mg at 07/29/17 0820  . traZODone (DESYREL) tablet 50 mg  50 mg Oral QHS Maeola Harman, MD   50 mg at 07/28/17 2239     Discharge Medications: Please see discharge summary for a list of discharge medications.  Relevant Imaging Results:  Relevant Lab Results:   Additional Information SS#: [id number]  Althea Charon, LCSW

## 2017-07-29 NOTE — Progress Notes (Signed)
Physical Therapy Treatment Patient Details Name: Danielle ClassJanice Buchanan MRN: [id number] DOB: Sep 25, 1956 Today's Date: 07/29/2017    History of Present Illness Patient is a 61 yo female admitted 07/23/17 with pain in Lt foot.  Patient with non-healing wounds, now s/p Lt BKA on 07/23/17.    PMH:  PVK, KM, chronic foot ulcers, depression, HTN, HLD, OSA    PT Comments    Patient continues to be eager to mobilize and participate in therapy. Pt required min A +2 for sit to stand transfers and min/mod A +2 for ambulation of ~343ft. Pt limited by fatigue this session and returned to bed. Continue to progress as tolerated with anticipated d/c to SNF for further skilled PT services.      Follow Up Recommendations  SNF     Equipment Recommendations   (to be determined at next venue)    Recommendations for Other Services Rehab consult;OT consult     Precautions / Restrictions Precautions Precautions: Fall Precaution Comments: New Lt BKA Restrictions Weight Bearing Restrictions: Yes LLE Weight Bearing: Non weight bearing    Mobility  Bed Mobility Overal bed mobility: Needs Assistance Bed Mobility: Rolling;Sidelying to Sit;Sit to Supine Rolling: Supervision (use of rail and HOB elevated) Sidelying to sit: HOB elevated;Supervision   Sit to supine: Max assist;+2 for physical assistance   General bed mobility comments: supervision for safety with coming into sitting and use of rail; pt required max A to return to supine as pt began sitting prematurely and required guidance onto EOB; pt positioned with bed pad and able to scoot up into bed with supervision  Transfers Overall transfer level: Needs assistance Equipment used: Rolling walker (2 wheeled) Transfers: Sit to/from Stand Sit to Stand: Min assist;+2 safety/equipment         General transfer comment: assist to power up into standing and to gain balance upon stand; cues for safe hand placement  Ambulation/Gait Ambulation/Gait  assistance: Min assist;+2 physical assistance;Mod assist Ambulation Distance (Feet): 3 Feet Assistive device: Rolling walker (2 wheeled) Gait Pattern/deviations:  (hop-to pattern) Gait velocity: decreased   General Gait Details: cues for sequencing and posture; pt limited by fatigue   Stairs            Wheelchair Mobility    Modified Rankin (Stroke Patients Only)       Balance Overall balance assessment: Needs assistance Sitting-balance support: No upper extremity supported (RLE supported) Sitting balance-Leahy Scale: Fair Sitting balance - Comments: pt able to sit EOB and don sock   Standing balance support: Bilateral upper extremity supported Standing balance-Leahy Scale: Poor Standing balance comment: reliant on RW for standing                            Cognition Arousal/Alertness: Awake/alert Behavior During Therapy: WFL for tasks assessed/performed;Impulsive Overall Cognitive Status: Within Functional Limits for tasks assessed                                        Exercises      General Comments General comments (skin integrity, edema, etc.): pt educated on L LE positioning      Pertinent Vitals/Pain Pain Assessment: Faces Faces Pain Scale: Hurts whole lot Pain Location: L LE Pain Descriptors / Indicators: Aching;Constant;Pressure Pain Intervention(s): Limited activity within patient's tolerance;Monitored during session;Premedicated before session;Repositioned;Patient requesting pain meds-RN notified    Home Living  Prior Function            PT Goals (current goals can now be found in the care plan section) Acute Rehab PT Goals PT Goal Formulation: With patient/family Time For Goal Achievement: 07/31/17 Potential to Achieve Goals: Good Progress towards PT goals: Progressing toward goals    Frequency    Min 4X/week      PT Plan Current plan remains appropriate    Co-evaluation               AM-PAC PT "6 Clicks" Daily Activity  Outcome Measure  Difficulty turning over in bed (including adjusting bedclothes, sheets and blankets)?: Unable Difficulty moving from lying on back to sitting on the side of the bed? : Unable Difficulty sitting down on and standing up from a chair with arms (e.g., wheelchair, bedside commode, etc,.)?: Unable Help needed moving to and from a bed to chair (including a wheelchair)?: A Lot Help needed walking in hospital room?: A Lot Help needed climbing 3-5 steps with a railing? : Total 6 Click Score: 8    End of Session Equipment Utilized During Treatment: Gait belt Activity Tolerance: Patient limited by fatigue Patient left: with call bell/phone within reach;in bed;with bed alarm set Nurse Communication: Mobility status;Patient requests pain meds;Other (comment) (bed alarm on) PT Visit Diagnosis: Difficulty in walking, not elsewhere classified (R26.2);Pain Pain - Right/Left: Left Pain - part of body: Leg     Time: 1610-9604 PT Time Calculation (min) (ACUTE ONLY): 26 min  Charges:  $Gait Training: 8-22 mins                    G Codes:       [name], PTA Pager: [phone]     [name] 07/29/2017, 11:35 AM

## 2017-07-30 DIAGNOSIS — G4733 Obstructive sleep apnea (adult) (pediatric): Secondary | ICD-10-CM | POA: Diagnosis not present

## 2017-07-30 DIAGNOSIS — I96 Gangrene, not elsewhere classified: Secondary | ICD-10-CM | POA: Diagnosis not present

## 2017-07-30 DIAGNOSIS — L97518 Non-pressure chronic ulcer of other part of right foot with other specified severity: Secondary | ICD-10-CM | POA: Diagnosis not present

## 2017-07-30 DIAGNOSIS — I70235 Atherosclerosis of native arteries of right leg with ulceration of other part of foot: Secondary | ICD-10-CM | POA: Diagnosis not present

## 2017-07-30 DIAGNOSIS — E119 Type 2 diabetes mellitus without complications: Secondary | ICD-10-CM | POA: Diagnosis not present

## 2017-07-30 DIAGNOSIS — L97519 Non-pressure chronic ulcer of other part of right foot with unspecified severity: Secondary | ICD-10-CM | POA: Diagnosis not present

## 2017-07-30 DIAGNOSIS — F329 Major depressive disorder, single episode, unspecified: Secondary | ICD-10-CM | POA: Diagnosis not present

## 2017-07-30 DIAGNOSIS — I10 Essential (primary) hypertension: Secondary | ICD-10-CM | POA: Diagnosis not present

## 2017-07-30 DIAGNOSIS — S88019A Complete traumatic amputation at knee level, unspecified lower leg, initial encounter: Secondary | ICD-10-CM | POA: Diagnosis not present

## 2017-07-30 DIAGNOSIS — R2689 Other abnormalities of gait and mobility: Secondary | ICD-10-CM | POA: Diagnosis not present

## 2017-07-30 DIAGNOSIS — I739 Peripheral vascular disease, unspecified: Secondary | ICD-10-CM | POA: Diagnosis not present

## 2017-07-30 DIAGNOSIS — T8149XD Infection following a procedure, other surgical site, subsequent encounter: Secondary | ICD-10-CM | POA: Diagnosis not present

## 2017-07-30 DIAGNOSIS — G4700 Insomnia, unspecified: Secondary | ICD-10-CM | POA: Diagnosis not present

## 2017-07-30 DIAGNOSIS — R278 Other lack of coordination: Secondary | ICD-10-CM | POA: Diagnosis not present

## 2017-07-30 DIAGNOSIS — S8990XA Unspecified injury of unspecified lower leg, initial encounter: Secondary | ICD-10-CM | POA: Diagnosis not present

## 2017-07-30 DIAGNOSIS — E1169 Type 2 diabetes mellitus with other specified complication: Secondary | ICD-10-CM | POA: Diagnosis not present

## 2017-07-30 DIAGNOSIS — Z4789 Encounter for other orthopedic aftercare: Secondary | ICD-10-CM | POA: Diagnosis not present

## 2017-07-30 DIAGNOSIS — W19XXXD Unspecified fall, subsequent encounter: Secondary | ICD-10-CM | POA: Diagnosis not present

## 2017-07-30 DIAGNOSIS — M6281 Muscle weakness (generalized): Secondary | ICD-10-CM | POA: Diagnosis not present

## 2017-07-30 DIAGNOSIS — T8189XD Other complications of procedures, not elsewhere classified, subsequent encounter: Secondary | ICD-10-CM | POA: Diagnosis not present

## 2017-07-30 DIAGNOSIS — F3341 Major depressive disorder, recurrent, in partial remission: Secondary | ICD-10-CM | POA: Diagnosis not present

## 2017-07-30 LAB — GLUCOSE, CAPILLARY
GLUCOSE-CAPILLARY: 265 mg/dL — AB (ref 65–99)
GLUCOSE-CAPILLARY: 346 mg/dL — AB (ref 65–99)

## 2017-07-30 MED ORDER — CEPHALEXIN 500 MG PO CAPS: 500.0000 mg | ORAL_CAPSULE | Freq: Three times a day (TID) | ORAL | 0 refills | Status: DC

## 2017-07-30 MED ORDER — CEPHALEXIN 500 MG PO CAPS
500.0000 mg | ORAL_CAPSULE | Freq: Four times a day (QID) | ORAL | Status: DC
  Administered 2017-07-30: 500 mg via ORAL
  Filled 2017-07-30: qty 1

## 2017-07-30 NOTE — Progress Notes (Signed)
Inpatient Diabetes Program Recommendations  AACE/ADA: New Consensus Statement on Inpatient Glycemic Control (2015)  Target Ranges:  Prepandial:   less than 140 mg/dL      Peak postprandial:   less than 180 mg/dL (1-2 hours)      Critically ill patients:  140 - 180 mg/dL   Lab Results  Component Value Date   GLUCAP 265 (H) 07/30/2017   HGBA1C 11.6 (H) 06/10/2017    Review of Glycemic Control  Diabetes history: DM2 Outpatient Diabetes medications: 70/30 90 units bid Current orders for Inpatient glycemic control: 70/30 23 units bid, Novolog 0-15 units tidwc and hs  Endo - Dr. Hyman HopesWebb HgbA1C 11.6% - indicates poor glycemic control at home.  See note from Diabetes Coordinator on 07/24/2017:  Patient has noted from Endocrinologist for DM management outpatient, however, patient reports being followed by Dr. Hyman HopesWebb for DM management. She reports last seeing her 1 month ago and reduced her 70/30 dose from 90 units but can't remember her current dose.  Patient reports [facility] and could not afford it for this month. This is also why she does not know her current insulin dose.   Spoke with patient about [employer] 70/30 insulin being $25/vial but has to be the [employer] brand for it to be $25. Spoke with patient about A1c level 11.6% on 06/10/17. Encouraged glucose control for wound healing. Suggested her to ask her PCP, Dr. Hyman HopesWebb about Endocrinology consult.  Inpatient Diabetes Program Recommendations:    Increase 70/30 to 45 units bid.  Continue to follow while inpatient.  Thank you. Danielle Buchanan, RD, LDN, CDE Inpatient Diabetes Coordinator [phone]

## 2017-07-30 NOTE — Progress Notes (Addendum)
Clinical Social Worker facilitated patient discharge including contacting patient family and facility to confirm patient discharge plans.  Clinical information faxed to facility and family agreeable with plan.  CSW [facility]  RN to call [phone](pt will go in rm# [phone]) for report prior to discharge.  Clinical Social Worker will sign off for now as social work intervention is no longer needed. Please consult us again if new need arises.  Marrianne MoodAshley Brynn Mulgrew, MSW, [employer]CSWA [phone]

## 2017-07-30 NOTE — Progress Notes (Signed)
Physical Therapy Treatment Patient Details Name: Danielle Buchanan ClassJanice Iglehart MRN: [id number] DOB: May 05, 1956 Today's Date: 07/30/2017    History of Present Illness Patient is a 61 yo female admitted 07/23/17 with pain in Lt foot.  Patient with non-healing wounds, now s/p Lt BKA on 07/23/17.    PMH:  PVK, KM, chronic foot ulcers, depression, HTN, HLD, OSA    PT Comments    Patient tolerated session well focusing on functional transfers, L LE therex, and gait. Pt was able to ambulate 412ft bouts X3 with close chair follow as pt gives very little notice before beginning to sit. Continue to progress as tolerated with anticipated d/c to SNF for further skilled PT services.     Follow Up Recommendations  SNF     Equipment Recommendations   (to be determined at next venue)    Recommendations for Other Services Rehab consult;OT consult     Precautions / Restrictions Precautions Precautions: Fall Precaution Comments: New Lt BKA Restrictions Weight Bearing Restrictions: Yes LLE Weight Bearing: Non weight bearing    Mobility  Bed Mobility               General bed mobility comments: pt OOB in chair upon arrival  Transfers Overall transfer level: Needs assistance Equipment used: Rolling walker (2 wheeled) Transfers: Sit to/from Stand Sit to Stand: Mod assist;+2 safety/equipment         General transfer comment: assist to power up into standing and to gain balance upon standing; cues for safe hand placement and to slow down and find COG/balance prior to beginning to take steps  Ambulation/Gait Ambulation/Gait assistance: Min assist;+2 physical assistance;Mod assist Ambulation Distance (Feet):  (~72ft bouts X3) Assistive device: Rolling walker (2 wheeled) Gait Pattern/deviations: Step-to pattern (hop-to pattern) Gait velocity: decreased   General Gait Details: multimodal cues for posture, proximity of RW, and safety; pt required seated rest breaks due to fatigue and gives little notice  prior to sitting; close chair follow utilized for ambulating   Stairs            Wheelchair Mobility    Modified Rankin (Stroke Patients Only)       Balance Overall balance assessment: Needs assistance Sitting-balance support: No upper extremity supported (R LE supported) Sitting balance-Leahy Scale: Fair     Standing balance support: Bilateral upper extremity supported Standing balance-Leahy Scale: Poor Standing balance comment: reliant on RW for standing                            Cognition Arousal/Alertness: Awake/alert Behavior During Therapy: WFL for tasks assessed/performed;Impulsive Overall Cognitive Status: Within Functional Limits for tasks assessed Area of Impairment: Safety/judgement                         Safety/Judgement: Decreased awareness of safety;Decreased awareness of deficits     General Comments: pt gives very little notice when needing to sit       Exercises Amputee Exercises Hip ABduction/ADduction: AROM;Left;10 reps Hip Flexion/Marching: AROM;10 reps;Both;Seated Knee Flexion: AROM;Left;10 reps Knee Extension: AROM;Left;Seated;10 reps Straight Leg Raises: AROM;Left;10 reps Chair Push Up: AROM;10 reps    General Comments        Pertinent Vitals/Pain Pain Assessment: 0-10 Faces Pain Scale: Hurts whole lot Pain Location: L LE Pain Descriptors / Indicators: Aching Pain Intervention(s): Monitored during session;Limited activity within patient's tolerance;Premedicated before session;Repositioned    Home Living  Prior Function            PT Goals (current goals can now be found in the care plan section) Acute Rehab PT Goals PT Goal Formulation: With patient/family Time For Goal Achievement: 07/31/17 Potential to Achieve Goals: Good Progress towards PT goals: Progressing toward goals    Frequency    Min 4X/week      PT Plan Current plan remains appropriate     Co-evaluation              AM-PAC PT "6 Clicks" Daily Activity  Outcome Measure  Difficulty turning over in bed (including adjusting bedclothes, sheets and blankets)?: Unable Difficulty moving from lying on back to sitting on the side of the bed? : Unable Difficulty sitting down on and standing up from a chair with arms (e.g., wheelchair, bedside commode, etc,.)?: Unable Help needed moving to and from a bed to chair (including a wheelchair)?: A Lot Help needed walking in hospital room?: A Lot Help needed climbing 3-5 steps with a railing? : Total 6 Click Score: 8    End of Session Equipment Utilized During Treatment: Gait belt Activity Tolerance: Patient tolerated treatment well Patient left: with call bell/phone within reach;in chair;with chair alarm set Nurse Communication: Mobility status PT Visit Diagnosis: Difficulty in walking, not elsewhere classified (R26.2);Pain Pain - Right/Left: Left Pain - part of body: Leg     Time: 1191-4782 PT Time Calculation (min) (ACUTE ONLY): 25 min  Charges:  $Gait Training: 8-22 mins $Therapeutic Exercise: 8-22 mins                    G Codes:       [name], PTA Pager: [phone]     [name] 07/30/2017, 1:47 PM

## 2017-07-30 NOTE — Progress Notes (Addendum)
Patient being discharged to nursing home. Report has been called and given to the nurse. No IV access at this time. Will continue to monitor until Pitar arrives. prescription given to transport to take to the facility.   Tristin Gladman, Charlaine DaltonAnn Brooke RN

## 2017-07-30 NOTE — Progress Notes (Addendum)
[facility] [place]  Subjective  - Still pain lateral stump from fall.   Objective 138/77 (!) 103 98.1 F (36.7 C) (Oral) 17 97% No intake or output data in the 24 hours ending 07/30/17 0913  Left stump with ss drainage.  3 staples removed lateral stump, dry guaze packed .  Fat necrosis. Ecchymosis  medial and lateral dissipating.  No change in right foot ulcer.  Daily santyl and alginate dressing changes to right foot.     Assessment/Planning: 61 y.o.femaleis s/p left belowknee amputation 7 Days Post-Op Lateral stump SS drainage, pack with dry guaze daily.  Fat necrosis. She is afebrile. Plan for discharge to SNF once insurance is in order. F/U with Dr. Randie Heinzain in 2 weeks. Started Keflex 500 mg TID for 2 weeks.    Clinton GallantCOLLINS, EMMA [facility] 07/30/2017 9:13 AM --  Laboratory Lab Results: No results for input(s): WBC, HGB, HCT, PLT in the last 72 hours. BMET No results for input(s): NA, K, CL, CO2, GLUCOSE, BUN, CREATININE, CALCIUM in the last 72 hours.  COAG Lab Results  Component Value Date   INR 0.92 06/09/2017   No results found for: PTT   I have independently interviewed and examined patient and agree with PA assessment and plan above. Unfortunately has fat necrosis of bka site and a few staples were removed. Will place on preventative antibiotics and get shorter order follow-up.  Ajah Vanhoose C. Randie Heinzain, MD [facility] [place] Office: [phone] Pager: [phone]

## 2017-07-31 ENCOUNTER — Ambulatory Visit: Payer: Medicare HMO | Admitting: Vascular Surgery

## 2017-07-31 DIAGNOSIS — L97518 Non-pressure chronic ulcer of other part of right foot with other specified severity: Secondary | ICD-10-CM | POA: Diagnosis not present

## 2017-07-31 DIAGNOSIS — I10 Essential (primary) hypertension: Secondary | ICD-10-CM | POA: Diagnosis not present

## 2017-07-31 DIAGNOSIS — T8189XD Other complications of procedures, not elsewhere classified, subsequent encounter: Secondary | ICD-10-CM | POA: Diagnosis not present

## 2017-07-31 DIAGNOSIS — I70235 Atherosclerosis of native arteries of right leg with ulceration of other part of foot: Secondary | ICD-10-CM | POA: Diagnosis not present

## 2017-07-31 DIAGNOSIS — E1169 Type 2 diabetes mellitus with other specified complication: Secondary | ICD-10-CM | POA: Diagnosis not present

## 2017-07-31 DIAGNOSIS — E119 Type 2 diabetes mellitus without complications: Secondary | ICD-10-CM | POA: Diagnosis not present

## 2017-07-31 DIAGNOSIS — I739 Peripheral vascular disease, unspecified: Secondary | ICD-10-CM | POA: Diagnosis not present

## 2017-07-31 DIAGNOSIS — G4733 Obstructive sleep apnea (adult) (pediatric): Secondary | ICD-10-CM | POA: Diagnosis not present

## 2017-07-31 DIAGNOSIS — F3341 Major depressive disorder, recurrent, in partial remission: Secondary | ICD-10-CM | POA: Diagnosis not present

## 2017-07-31 DIAGNOSIS — I96 Gangrene, not elsewhere classified: Secondary | ICD-10-CM | POA: Diagnosis not present

## 2017-08-05 DIAGNOSIS — I739 Peripheral vascular disease, unspecified: Secondary | ICD-10-CM | POA: Diagnosis not present

## 2017-08-05 DIAGNOSIS — L97518 Non-pressure chronic ulcer of other part of right foot with other specified severity: Secondary | ICD-10-CM | POA: Diagnosis not present

## 2017-08-05 DIAGNOSIS — E1169 Type 2 diabetes mellitus with other specified complication: Secondary | ICD-10-CM | POA: Diagnosis not present

## 2017-08-05 DIAGNOSIS — I96 Gangrene, not elsewhere classified: Secondary | ICD-10-CM | POA: Diagnosis not present

## 2017-08-06 ENCOUNTER — Telehealth: Payer: Self-pay | Admitting: *Deleted

## 2017-08-06 NOTE — Telephone Encounter (Signed)
Danielle Buchanan at [facility] called to report that this patient's stump is draining at times. She has no fever but the family is insisting that she be brought in to us for evaluation asap. I have made her a wound check appt for this Friday with our NP. They understand that they should call us if any S&S of infections should occur.

## 2017-08-07 DIAGNOSIS — T8149XD Infection following a procedure, other surgical site, subsequent encounter: Secondary | ICD-10-CM | POA: Diagnosis not present

## 2017-08-07 DIAGNOSIS — W19XXXD Unspecified fall, subsequent encounter: Secondary | ICD-10-CM | POA: Diagnosis not present

## 2017-08-07 DIAGNOSIS — I96 Gangrene, not elsewhere classified: Secondary | ICD-10-CM | POA: Diagnosis not present

## 2017-08-07 DIAGNOSIS — I70235 Atherosclerosis of native arteries of right leg with ulceration of other part of foot: Secondary | ICD-10-CM | POA: Diagnosis not present

## 2017-08-07 DIAGNOSIS — T8189XD Other complications of procedures, not elsewhere classified, subsequent encounter: Secondary | ICD-10-CM | POA: Diagnosis not present

## 2017-08-07 DIAGNOSIS — E1169 Type 2 diabetes mellitus with other specified complication: Secondary | ICD-10-CM | POA: Diagnosis not present

## 2017-08-08 ENCOUNTER — Encounter: Payer: Self-pay | Admitting: Family

## 2017-08-08 ENCOUNTER — Ambulatory Visit (INDEPENDENT_AMBULATORY_CARE_PROVIDER_SITE_OTHER): Payer: Self-pay | Admitting: Family

## 2017-08-08 VITALS — BP 104/72 | HR 86 | Temp 97.7°F | Resp 16 | Ht 65.0 in | Wt 217.0 lb

## 2017-08-08 DIAGNOSIS — T8789 Other complications of amputation stump: Secondary | ICD-10-CM

## 2017-08-08 DIAGNOSIS — I998 Other disorder of circulatory system: Secondary | ICD-10-CM

## 2017-08-08 NOTE — Progress Notes (Signed)
Postoperative Visit   History of Present Illness  Danielle Buchanan is a 61 y.o. female who is s/p left BKA on 07-23-17 by [name] for non healing left foot ulceration.   Pt returns today after received phone call from Alesia at [facility] to report that this patient's stump is draining at times. She has no fever but the family is insisting that she be brought in to [place] for evaluation asap.  Pt denies fever or chills. She indicates little or no pain at left BKA.   Pt has uncontrolled DM, last A1C result on file was 11.6 on 06-10-17.  She quit smoking on 07-01-17, smoked x 51 years.   The patient's left BKA wound is not healing.    For VQI Use Only  PRE-ADM LIVING: Nursing home  AMB STATUS: Wheelchair    Past Medical History:  Diagnosis Date  . Allergic rhinitis   . Depression   . Diabetes (HCC)   . Dyslipidemia   . HTN (hypertension)   . Insomnia   . OSA (obstructive sleep apnea)    no cpap  . Urinary incontinence     Past Surgical History:  Procedure Laterality Date  . ABDOMINAL AORTOGRAM W/LOWER EXTREMITY N/A 06/24/2017   Procedure: ABDOMINAL AORTOGRAM W/LOWER EXTREMITY;  Surgeon: Maeola Harman, MD;  Location: [facility] INVASIVE CV LAB;  Service: Cardiovascular;  Laterality: N/A;  . AMPUTATION Left 07/23/2017   Procedure: AMPUTATION  BELOW KNEE;  Surgeon: Maeola Harman, MD;  Location: [facility] OR;  Service: Vascular;  Laterality: Left;  . CARPAL TUNNEL RELEASE Right   . COLONOSCOPY      Social History   Social History  . Marital status: Single    Spouse name: N/A  . Number of children: N/A  . Years of education: N/A   Occupational History  . Not on file.   Social History Main Topics  . Smoking status: Former Smoker    Packs/day: 1.00    Years: 51.00    Types: Cigarettes    Quit date: 07/01/2017  . Smokeless tobacco: Never Used  . Alcohol use No  . Drug use: No  . Sexual activity: Not on file   Other Topics Concern  . Not on file    Social History Narrative   Lives with sister in an apartment on the first floor.  Has no children.    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    Education: 3rd grade.    Allergies  Allergen Reactions  . Doxycycline Rash    Current Outpatient Prescriptions on File Prior to Visit  Medication Sig Dispense Refill  . amitriptyline (ELAVIL) 25 MG tablet Take 25 mg by mouth at bedtime.     . cephALEXin (KEFLEX) 500 MG capsule Take 1 capsule (500 mg total) by mouth 3 (three) times daily. 42 capsule 0  . cetirizine (ZYRTEC) 10 MG tablet Take 10 mg by mouth daily.     . [employer] NICOTINE TRANSDERMAL SYS 14 MG/24HR patch Apply 1 patch daily as needed for smoking cessation  11  . gabapentin (NEURONTIN) 300 MG capsule Take 300 mg by mouth 3 (three) times daily as needed (pain).   0  . insulin NPH-regular Human (NOVOLIN 70/30) (70-30) 100 UNIT/ML injection 120 units with breakfast, and 90 units with the evening meal (Patient taking differently: Inject 90 Units into the skin 2 (two) times daily. ) 70 mL 11  . losartan (COZAAR) 100 MG tablet  Take 100 mg by mouth daily.    . mupirocin cream (BACTROBAN) 2 % Apply 1 application topically daily as needed (sores).   0  . oxyCODONE-acetaminophen (PERCOCET/ROXICET) 5-325 MG tablet Take 1-2 tablets by mouth every 6 (six) hours as needed for severe pain. 20 tablet 0  . pravastatin (PRAVACHOL) 20 MG tablet Take 20 mg by mouth daily.     . pregabalin (LYRICA) 75 MG capsule Take 75 mg by mouth 2 (two) times daily.    . sertraline (ZOLOFT) 100 MG tablet Take 150 mg by mouth daily.     . traZODone (DESYREL) 50 MG tablet Take 50 mg by mouth at bedtime.    Marland Kitchen. HYDROcodone-acetaminophen (NORCO/VICODIN) 5-325 MG tablet Take 1 tablet by mouth 3 (three) times daily.   0   No current facility-administered medications on file prior to visit.       Physical Examination  Vitals:   08/08/17 1022  BP: 104/72  Pulse: 86  Resp: 16   Temp: 97.7 F (36.5 C)  SpO2: 96%  Weight: 217 lb (98.4 kg)  Height: 5\' 5"  (1.651 m)   Body mass index is 36.11 kg/m.   Staples are intact.   Left BKA anterior    Left BKA lateral    Left BKA medial      Medical Decision Making  Danielle Buchanan is a 61 y.o. female who presents s/p left BKA on 07-23-17.  BKA is not healing, has dark non viable tissue, tissue necrosis, dehiscence of incision.  Dr. Darrick PennaFields spoke with pt, sister, niece, and son in law.  Dr. Darrick PennaFields explained that muscle flap over stump is not healing, tissue necrosis (see photos above), and she most likely needs a left AKA which is more likely to heal. Distal surgical wound healing is severely compromised in the setting of uncontrolled DM and 51 year smoking history, having quit 5 weeks ago. Pt and family are not prepared to schedule a left AKA, will schedule pt to see Dr. Randie Heinzain tomorrow morning in office.    Azam Gervasi, Carma LairSUZANNE L, RN, MSN, FNP-C [facility] [place] Office: [phone]  08/08/2017, 10:57 AM  Clinic MD: Darrick PennaFields

## 2017-08-09 ENCOUNTER — Ambulatory Visit: Payer: Medicare HMO | Admitting: Family

## 2017-08-09 ENCOUNTER — Encounter: Payer: Self-pay | Admitting: *Deleted

## 2017-08-09 ENCOUNTER — Encounter: Payer: Self-pay | Admitting: Vascular Surgery

## 2017-08-09 ENCOUNTER — Other Ambulatory Visit: Payer: Self-pay | Admitting: *Deleted

## 2017-08-09 ENCOUNTER — Ambulatory Visit (INDEPENDENT_AMBULATORY_CARE_PROVIDER_SITE_OTHER): Payer: Self-pay | Admitting: Vascular Surgery

## 2017-08-09 ENCOUNTER — Encounter (HOSPITAL_COMMUNITY): Payer: Self-pay | Admitting: *Deleted

## 2017-08-09 VITALS — BP 133/73 | HR 79 | Temp 97.4°F | Resp 18 | Ht 65.0 in | Wt 217.0 lb

## 2017-08-09 DIAGNOSIS — I739 Peripheral vascular disease, unspecified: Secondary | ICD-10-CM

## 2017-08-09 NOTE — Progress Notes (Signed)
Patient ID: Danielle Buchanan, female   DOB: 22-Jul-1956, 61 y.o.   MRN: [id number]  Reason for Consult: Follow-up (eval by [facility] per Rosalita Chessman NP)   Referred by [name], MD  Subjective:     HPI:  Danielle Buchanan is a 61 y.o. female follows up from recent left below-knee amputation.  Unfortunately in the hospital she had a fall and now follows up with wound as demonstrated in Suzanne's note yesterday.  She is not having any fevers or chills or other constitutional symptoms.  Unfortunately on discharge she went to a nursing facility but discharged herself and had to purchase a wheelchair upon getting home does not have other supplies for home.  They are now interested in getting that and will need home health physical therapy and nursing upon next discharge from the hospital.  Past Medical History:  Diagnosis Date  . Allergic rhinitis   . Depression   . Diabetes (HCC)   . Dyslipidemia   . HTN (hypertension)   . Insomnia   . OSA (obstructive sleep apnea)    no cpap  . Urinary incontinence    Family History  Problem Relation Age of Onset  . Heart attack Mother   . Alcohol abuse Mother   . Heart attack Father   . Alcohol abuse Father   . Heart attack Sister   . Heart attack Brother   . Hypercholesterolemia Sister   . Diabetes Neg Hx    Past Surgical History:  Procedure Laterality Date  . ABDOMINAL AORTOGRAM W/LOWER EXTREMITY N/A 06/24/2017   Procedure: ABDOMINAL AORTOGRAM W/LOWER EXTREMITY;  Surgeon: Maeola Harman, MD;  Location: [facility] INVASIVE CV LAB;  Service: Cardiovascular;  Laterality: N/A;  . AMPUTATION Left 07/23/2017   Procedure: AMPUTATION  BELOW KNEE;  Surgeon: Maeola Harman, MD;  Location: [facility] OR;  Service: Vascular;  Laterality: Left;  . CARPAL TUNNEL RELEASE Right   . COLONOSCOPY      Short Social History:  Social History  Substance Use Topics  . Smoking status: Former Smoker    Packs/day: 1.00    Years: 51.00    Types: Cigarettes    Quit  date: 07/01/2017  . Smokeless tobacco: Never Used  . Alcohol use No    Allergies  Allergen Reactions  . Doxycycline Rash    Current Outpatient Prescriptions  Medication Sig Dispense Refill  . amitriptyline (ELAVIL) 25 MG tablet Take 25 mg by mouth at bedtime.     . cetirizine (ZYRTEC) 10 MG tablet Take 10 mg by mouth daily.     . [employer]S NICOTINE TRANSDERMAL SYS 14 MG/24HR patch Apply 1 patch daily as needed for smoking cessation  11  . gabapentin (NEURONTIN) 300 MG capsule Take 300 mg by mouth 3 (three) times daily as needed (pain).   0  . HYDROcodone-acetaminophen (NORCO/VICODIN) 5-325 MG tablet Take 1 tablet by mouth 3 (three) times daily.   0  . insulin NPH-regular Human (NOVOLIN 70/30) (70-30) 100 UNIT/ML injection 120 units with breakfast, and 90 units with the evening meal (Patient taking differently: Inject 90 Units into the skin 2 (two) times daily. ) 70 mL 11  . losartan (COZAAR) 100 MG tablet Take 100 mg by mouth daily.    . mupirocin cream (BACTROBAN) 2 % Apply 1 application topically daily as needed (sores).   0  . oxyCODONE-acetaminophen (PERCOCET/ROXICET) 5-325 MG tablet Take 1-2 tablets by mouth every 6 (six) hours as needed for severe pain. 20 tablet 0  . pravastatin (PRAVACHOL) 20  MG tablet Take 20 mg by mouth daily.     . pregabalin (LYRICA) 75 MG capsule Take 75 mg by mouth 2 (two) times daily.    . sertraline (ZOLOFT) 100 MG tablet Take 150 mg by mouth daily.     . traZODone (DESYREL) 50 MG tablet Take 50 mg by mouth at bedtime.    . cephALEXin (KEFLEX) 500 MG capsule Take 1 capsule (500 mg total) by mouth 3 (three) times daily. (Patient not taking: Reported on 08/09/2017) 42 capsule 0   No current facility-administered medications for this visit.     Review of Systems  Skin: Positive for wound.        Objective:  Objective   Vitals:   08/09/17 0839  BP: 133/73  Pulse: 79  Resp: 18  Temp: (!) 97.4 F (36.3 C)  TempSrc: Oral  SpO2: 96%  Weight: 217 lb  (98.4 kg)  Height: 5\' 5"  (1.651 m)   Body mass index is 36.11 kg/m.  Physical Exam  Constitutional: She is oriented to person, place, and time. She appears well-developed.  HENT:  Head: Normocephalic.  Eyes: Pupils are equal, round, and reactive to light.  Neck: Normal range of motion.  Cardiovascular:  Pulses:      Popliteal pulses are 2+ on the right side, and 2+ on the left side.  Pulmonary/Chest: Effort normal.  Abdominal: Soft.  Musculoskeletal:  Left bka breakdown  Neurological: She is alert and oriented to person, place, and time.  Psychiatric: She has a normal mood and affect. Her behavior is normal. Judgment and thought content normal.         Assessment/Plan:     61 year old female with recent left below-knee amputation the now has wound breakdown.  We discussed the options moving forward which would be a revision of left below-knee amputation versus conversion to left above-knee amputation we will get this scheduled for early next week.  She does not want to return to nursing facility and will need to be set up for home health upon discharge.  I written her prescription for a wheelchair today we will get her scheduled for early next week.  She demonstrates good understanding in the presence of her family.     Maeola HarmanBrandon Christopher Terrionna Bridwell MD [facility]

## 2017-08-09 NOTE — Progress Notes (Signed)
Pt denies SOB, chest pain, and being under the care of a cardiologist. Pt stated that a stress test was performed > 10 years ago. Pt denies having an echo and cardiac cath. Pt made aware to stop taking vitamins, fish oil , Melatonin, Zinc and herbal medications. Do not take any NSAIDs ie: Ibuprofen, Advil, Naproxen (Aleve),Motrin,BC and Goody Powder. Pt made aware to take only 63 units of NPH Novolin 70/30 insulin the night before surgery and no insulin DOS. Pt made aware to check BG every 2 hours prior to arrival to hospital on DOS. Pt made aware to treat a BG < 70 with  4 ounces of cranberry juice, wait 15 minutes after drinking juice to recheck BG, if BG remains < 70, call Short Stay unit to speak with a nurse. Pt verbalized understanding of all pre-op instructions.

## 2017-08-12 ENCOUNTER — Encounter (HOSPITAL_COMMUNITY)
Admission: RE | Disposition: A | Discharge status: Home Health | Payer: Self-pay | Source: Ambulatory Visit | Attending: Vascular Surgery

## 2017-08-12 ENCOUNTER — Inpatient Hospital Stay (HOSPITAL_COMMUNITY): Payer: Medicare HMO | Admitting: Anesthesiology

## 2017-08-12 ENCOUNTER — Inpatient Hospital Stay (HOSPITAL_COMMUNITY)
Admission: RE | Admit: 2017-08-12 | Discharge: 2017-08-15 | DRG: 476 | Disposition: A | Discharge status: Home Health | Payer: Medicare HMO | Source: Ambulatory Visit | Attending: Vascular Surgery | Admitting: Vascular Surgery

## 2017-08-12 DIAGNOSIS — T8754 Necrosis of amputation stump, left lower extremity: Principal | ICD-10-CM | POA: Diagnosis present

## 2017-08-12 DIAGNOSIS — W010XXA Fall on same level from slipping, tripping and stumbling without subsequent striking against object, initial encounter: Secondary | ICD-10-CM | POA: Diagnosis present

## 2017-08-12 DIAGNOSIS — I10 Essential (primary) hypertension: Secondary | ICD-10-CM | POA: Diagnosis not present

## 2017-08-12 DIAGNOSIS — E785 Hyperlipidemia, unspecified: Secondary | ICD-10-CM | POA: Diagnosis not present

## 2017-08-12 DIAGNOSIS — F329 Major depressive disorder, single episode, unspecified: Secondary | ICD-10-CM | POA: Diagnosis present

## 2017-08-12 DIAGNOSIS — Y835 Amputation of limb(s) as the cause of abnormal reaction of the patient, or of later complication, without mention of misadventure at the time of the procedure: Secondary | ICD-10-CM | POA: Diagnosis present

## 2017-08-12 DIAGNOSIS — E1151 Type 2 diabetes mellitus with diabetic peripheral angiopathy without gangrene: Secondary | ICD-10-CM | POA: Diagnosis not present

## 2017-08-12 DIAGNOSIS — G4733 Obstructive sleep apnea (adult) (pediatric): Secondary | ICD-10-CM | POA: Diagnosis present

## 2017-08-12 DIAGNOSIS — J309 Allergic rhinitis, unspecified: Secondary | ICD-10-CM | POA: Diagnosis not present

## 2017-08-12 DIAGNOSIS — T8781 Dehiscence of amputation stump: Secondary | ICD-10-CM | POA: Diagnosis not present

## 2017-08-12 DIAGNOSIS — G4700 Insomnia, unspecified: Secondary | ICD-10-CM | POA: Diagnosis not present

## 2017-08-12 DIAGNOSIS — I739 Peripheral vascular disease, unspecified: Secondary | ICD-10-CM | POA: Diagnosis present

## 2017-08-12 HISTORY — PX: AMPUTATION: SHX166

## 2017-08-12 LAB — BASIC METABOLIC PANEL
ANION GAP: 10 (ref 5–15)
BUN: 9 mg/dL (ref 6–20)
CHLORIDE: 103 mmol/L (ref 101–111)
CO2: 23 mmol/L (ref 22–32)
Calcium: 9.1 mg/dL (ref 8.9–10.3)
Creatinine, Ser: 0.68 mg/dL (ref 0.44–1.00)
GFR calc Af Amer: >60 mL/min (ref >60)
GLUCOSE: 153 mg/dL — AB (ref 65–99)
POTASSIUM: 4.8 mmol/L (ref 3.5–5.1)
Sodium: 136 mmol/L (ref 135–145)

## 2017-08-12 LAB — CREATININE, SERUM: CREATININE: 0.78 mg/dL (ref 0.44–1.00)

## 2017-08-12 LAB — GLUCOSE, CAPILLARY
GLUCOSE-CAPILLARY: 288 mg/dL — AB (ref 65–99)
Glucose-Capillary: 190 mg/dL — ABNORMAL HIGH (ref 65–99)
Glucose-Capillary: 147 mg/dL — ABNORMAL HIGH (ref 65–99)
Glucose-Capillary: 197 mg/dL — ABNORMAL HIGH (ref 65–99)
Glucose-Capillary: 220 mg/dL — ABNORMAL HIGH (ref 65–99)

## 2017-08-12 LAB — CBC
HCT: 37.8 % (ref 36.0–46.0)
HEMATOCRIT: 38.7 % (ref 36.0–46.0)
HEMOGLOBIN: 12.8 g/dL (ref 12.0–15.0)
HEMOGLOBIN: 11.9 g/dL — AB (ref 12.0–15.0)
MCH: 27.5 pg (ref 26.0–34.0)
MCH: 27.4 pg (ref 26.0–34.0)
MCHC: 33.1 g/dL (ref 30.0–36.0)
MCHC: 31.5 g/dL (ref 30.0–36.0)
MCV: 83 fL (ref 78.0–100.0)
MCV: 86.9 fL (ref 78.0–100.0)
PLATELETS: 263 10*3/uL (ref 150–400)
Platelets: 374 10*3/uL (ref 150–400)
RBC: 4.66 MIL/uL (ref 3.87–5.11)
RBC: 4.35 MIL/uL (ref 3.87–5.11)
RDW: 15 % (ref 11.5–15.5)
RDW: 14 % (ref 11.5–15.5)
WBC: 8.9 10*3/uL (ref 4.0–10.5)
WBC: 10.7 10*3/uL — AB (ref 4.0–10.5)

## 2017-08-12 LAB — HEMOGLOBIN A1C
HEMOGLOBIN A1C: 9.3 % — AB (ref 4.8–5.6)
MEAN PLASMA GLUCOSE: 220.21 mg/dL

## 2017-08-12 SURGERY — AMPUTATION, ABOVE KNEE
Anesthesia: General | Laterality: Left

## 2017-08-12 MED ORDER — MAGNESIUM SULFATE 2 GM/50ML IV SOLN
2.0000 g | Freq: Every day | INTRAVENOUS | Status: DC | PRN
  Filled 2017-08-12: qty 50

## 2017-08-12 MED ORDER — LOSARTAN POTASSIUM 50 MG PO TABS
100.0000 mg | ORAL_TABLET | Freq: Every day | ORAL | Status: DC
Start: 2017-08-12 — End: 2017-08-15
  Administered 2017-08-12 – 2017-08-15 (×4): 100 mg via ORAL
  Filled 2017-08-12 (×4): qty 2

## 2017-08-12 MED ORDER — MORPHINE SULFATE (PF) 4 MG/ML IV SOLN
INTRAVENOUS | Status: AC
  Filled 2017-08-12: qty 1

## 2017-08-12 MED ORDER — HYDROMORPHONE HCL 1 MG/ML IJ SOLN
INTRAMUSCULAR | Status: AC
  Administered 2017-08-12: 0.5 mg via INTRAVENOUS
  Filled 2017-08-12: qty 1

## 2017-08-12 MED ORDER — INSULIN ASPART 100 UNIT/ML ~~LOC~~ SOLN
0.0000 [IU] | Freq: Three times a day (TID) | SUBCUTANEOUS | Status: DC
  Administered 2017-08-13: 7 [IU] via SUBCUTANEOUS
  Administered 2017-08-13 (×2): 5 [IU] via SUBCUTANEOUS
  Administered 2017-08-14: 9 [IU] via SUBCUTANEOUS
  Administered 2017-08-14 – 2017-08-15 (×3): 7 [IU] via SUBCUTANEOUS

## 2017-08-12 MED ORDER — MELATONIN 3 MG PO TABS
3.0000 mg | ORAL_TABLET | Freq: Every day | ORAL | Status: DC
  Administered 2017-08-12 – 2017-08-14 (×3): 3 mg via ORAL
  Filled 2017-08-12 (×3): qty 1

## 2017-08-12 MED ORDER — PROPOFOL 10 MG/ML IV BOLUS
INTRAVENOUS | Status: DC | PRN
  Administered 2017-08-12: 100 mg via INTRAVENOUS
  Administered 2017-08-12: 20 mg via INTRAVENOUS

## 2017-08-12 MED ORDER — LIDOCAINE HCL (CARDIAC) 20 MG/ML IV SOLN
INTRAVENOUS | Status: DC | PRN
  Administered 2017-08-12: 80 mg via INTRAVENOUS

## 2017-08-12 MED ORDER — MIDAZOLAM HCL 5 MG/5ML IJ SOLN
INTRAMUSCULAR | Status: DC | PRN
  Administered 2017-08-12: 2 mg via INTRAVENOUS

## 2017-08-12 MED ORDER — ROCURONIUM BROMIDE 100 MG/10ML IV SOLN
INTRAVENOUS | Status: DC | PRN
  Administered 2017-08-12: 40 mg via INTRAVENOUS

## 2017-08-12 MED ORDER — ENOXAPARIN SODIUM 30 MG/0.3ML ~~LOC~~ SOLN
30.0000 mg | SUBCUTANEOUS | Status: DC
  Administered 2017-08-13 – 2017-08-14 (×2): 30 mg via SUBCUTANEOUS
  Filled 2017-08-12 (×2): qty 0.3

## 2017-08-12 MED ORDER — VANCOMYCIN HCL IN DEXTROSE 1-5 GM/200ML-% IV SOLN
INTRAVENOUS | Status: AC
  Filled 2017-08-12: qty 200

## 2017-08-12 MED ORDER — AMITRIPTYLINE HCL 50 MG PO TABS
25.0000 mg | ORAL_TABLET | Freq: Every day | ORAL | Status: DC
  Administered 2017-08-12 – 2017-08-14 (×3): 25 mg via ORAL
  Filled 2017-08-12 (×3): qty 1

## 2017-08-12 MED ORDER — DEXTROSE 5 % IV SOLN
1.5000 g | Freq: Two times a day (BID) | INTRAVENOUS | Status: AC
  Administered 2017-08-12 – 2017-08-13 (×2): 1.5 g via INTRAVENOUS
  Filled 2017-08-12 (×2): qty 1.5

## 2017-08-12 MED ORDER — OXYCODONE HCL 5 MG/5ML PO SOLN: 5.0000 mg | Freq: Once | ORAL | Status: DC | PRN

## 2017-08-12 MED ORDER — LIDOCAINE 2% (20 MG/ML) 5 ML SYRINGE
INTRAMUSCULAR | Status: AC
  Filled 2017-08-12: qty 5

## 2017-08-12 MED ORDER — PRAVASTATIN SODIUM 20 MG PO TABS
20.0000 mg | ORAL_TABLET | Freq: Every day | ORAL | Status: DC
  Administered 2017-08-12 – 2017-08-15 (×4): 20 mg via ORAL
  Filled 2017-08-12 (×4): qty 1

## 2017-08-12 MED ORDER — ZINC SULFATE 220 (50 ZN) MG PO CAPS
220.0000 mg | ORAL_CAPSULE | Freq: Every day | ORAL | Status: DC
  Administered 2017-08-12 – 2017-08-15 (×4): 220 mg via ORAL
  Filled 2017-08-12 (×4): qty 1

## 2017-08-12 MED ORDER — FENTANYL CITRATE (PF) 250 MCG/5ML IJ SOLN
INTRAMUSCULAR | Status: AC
  Filled 2017-08-12: qty 5

## 2017-08-12 MED ORDER — VANCOMYCIN HCL IN DEXTROSE 1-5 GM/200ML-% IV SOLN: 1000.0000 mg | INTRAVENOUS | Status: DC

## 2017-08-12 MED ORDER — SENNOSIDES-DOCUSATE SODIUM 8.6-50 MG PO TABS: 1.0000 | ORAL_TABLET | Freq: Every evening | ORAL | Status: DC | PRN

## 2017-08-12 MED ORDER — CHLORHEXIDINE GLUCONATE 4 % EX LIQD: 60.0000 mL | Freq: Once | CUTANEOUS | Status: DC

## 2017-08-12 MED ORDER — SERTRALINE HCL 50 MG PO TABS
150.0000 mg | ORAL_TABLET | Freq: Every day | ORAL | Status: DC
  Administered 2017-08-12 – 2017-08-15 (×4): 150 mg via ORAL
  Filled 2017-08-12 (×4): qty 1

## 2017-08-12 MED ORDER — ROCURONIUM BROMIDE 10 MG/ML (PF) SYRINGE
PREFILLED_SYRINGE | INTRAVENOUS | Status: AC
  Filled 2017-08-12: qty 5

## 2017-08-12 MED ORDER — OXYCODONE HCL 5 MG PO TABS: 5.0000 mg | ORAL_TABLET | Freq: Once | ORAL | Status: DC | PRN

## 2017-08-12 MED ORDER — BISACODYL 5 MG PO TBEC
5.0000 mg | DELAYED_RELEASE_TABLET | Freq: Every day | ORAL | Status: DC | PRN
Start: 2017-08-12 — End: 2017-08-15

## 2017-08-12 MED ORDER — SODIUM CHLORIDE 0.9 % IV SOLN
INTRAVENOUS | Status: DC | PRN
  Administered 2017-08-12: 11:00:00 via INTRAVENOUS

## 2017-08-12 MED ORDER — FENTANYL CITRATE (PF) 100 MCG/2ML IJ SOLN
INTRAMUSCULAR | Status: DC | PRN
  Administered 2017-08-12 (×5): 50 ug via INTRAVENOUS

## 2017-08-12 MED ORDER — 0.9 % SODIUM CHLORIDE (POUR BTL) OPTIME
TOPICAL | Status: DC | PRN
  Administered 2017-08-12: 1000 mL

## 2017-08-12 MED ORDER — SODIUM CHLORIDE 0.9 % IV SOLN: INTRAVENOUS | Status: DC

## 2017-08-12 MED ORDER — HYDROMORPHONE HCL 1 MG/ML IJ SOLN
0.2500 mg | INTRAMUSCULAR | Status: DC | PRN
  Administered 2017-08-12 (×3): 0.5 mg via INTRAVENOUS

## 2017-08-12 MED ORDER — DOCUSATE SODIUM 100 MG PO CAPS
100.0000 mg | ORAL_CAPSULE | Freq: Every day | ORAL | Status: DC
  Administered 2017-08-13 – 2017-08-15 (×3): 100 mg via ORAL
  Filled 2017-08-12 (×3): qty 1

## 2017-08-12 MED ORDER — LABETALOL HCL 5 MG/ML IV SOLN
10.0000 mg | INTRAVENOUS | Status: DC | PRN
  Administered 2017-08-12: 10 mg via INTRAVENOUS
  Filled 2017-08-12: qty 4

## 2017-08-12 MED ORDER — NICOTINE 21 MG/24HR TD PT24
21.0000 mg | MEDICATED_PATCH | Freq: Every day | TRANSDERMAL | Status: DC
  Filled 2017-08-12 (×3): qty 1

## 2017-08-12 MED ORDER — PROPOFOL 10 MG/ML IV BOLUS
INTRAVENOUS | Status: AC
  Filled 2017-08-12: qty 20

## 2017-08-12 MED ORDER — HYDROMORPHONE HCL 1 MG/ML IJ SOLN
INTRAMUSCULAR | Status: AC
Start: 2017-08-12 — End: 2017-08-13
  Filled 2017-08-12: qty 1

## 2017-08-12 MED ORDER — VANCOMYCIN HCL 1000 MG IV SOLR
INTRAVENOUS | Status: DC | PRN
  Administered 2017-08-12: 1000 mg via INTRAVENOUS

## 2017-08-12 MED ORDER — TRAZODONE HCL 50 MG PO TABS
50.0000 mg | ORAL_TABLET | Freq: Every day | ORAL | Status: DC
  Administered 2017-08-12 – 2017-08-14 (×3): 50 mg via ORAL
  Filled 2017-08-12 (×3): qty 1

## 2017-08-12 MED ORDER — HYDROMORPHONE HCL 1 MG/ML IJ SOLN
0.2500 mg | INTRAMUSCULAR | Status: DC | PRN
  Administered 2017-08-12: 0.5 mg via INTRAVENOUS

## 2017-08-12 MED ORDER — MORPHINE SULFATE (PF) 2 MG/ML IV SOLN
2.0000 mg | INTRAVENOUS | Status: DC | PRN
  Administered 2017-08-12 (×2): 5 mg via INTRAVENOUS
  Administered 2017-08-12 – 2017-08-13 (×2): 4 mg via INTRAVENOUS
  Filled 2017-08-12: qty 3
  Filled 2017-08-12: qty 2
  Filled 2017-08-12: qty 3
  Filled 2017-08-12: qty 2

## 2017-08-12 MED ORDER — SODIUM CHLORIDE 0.9 % IV SOLN
INTRAVENOUS | Status: DC
  Administered 2017-08-12 – 2017-08-13 (×3): via INTRAVENOUS

## 2017-08-12 MED ORDER — ACETAMINOPHEN 650 MG RE SUPP: 325.0000 mg | RECTAL | Status: DC | PRN

## 2017-08-12 MED ORDER — CHLORHEXIDINE GLUCONATE 4 % EX LIQD
60.0000 mL | Freq: Once | CUTANEOUS | Status: DC
Start: 2017-08-13 — End: 2017-08-12

## 2017-08-12 MED ORDER — MORPHINE SULFATE (PF) 4 MG/ML IV SOLN
2.0000 mg | INTRAVENOUS | Status: DC | PRN
  Administered 2017-08-12 (×2): 2 mg via INTRAVENOUS

## 2017-08-12 MED ORDER — MORPHINE SULFATE (PF) 4 MG/ML IV SOLN
INTRAVENOUS | Status: AC
  Administered 2017-08-12: 2 mg via INTRAVENOUS
  Filled 2017-08-12: qty 1

## 2017-08-12 MED ORDER — ONDANSETRON HCL 4 MG/2ML IJ SOLN: 4.0000 mg | Freq: Four times a day (QID) | INTRAMUSCULAR | Status: DC | PRN

## 2017-08-12 MED ORDER — METOPROLOL TARTRATE 5 MG/5ML IV SOLN: 2.0000 mg | INTRAVENOUS | Status: DC | PRN

## 2017-08-12 MED ORDER — OXYCODONE-ACETAMINOPHEN 5-325 MG PO TABS
1.0000 | ORAL_TABLET | ORAL | Status: DC | PRN
  Administered 2017-08-13 (×2): 2 via ORAL
  Administered 2017-08-14: 1 via ORAL
  Administered 2017-08-14: 2 via ORAL
  Administered 2017-08-14: 1 via ORAL
  Administered 2017-08-14 – 2017-08-15 (×3): 2 via ORAL
  Filled 2017-08-12 (×4): qty 2
  Filled 2017-08-12: qty 1
  Filled 2017-08-12: qty 2
  Filled 2017-08-12: qty 1
  Filled 2017-08-12: qty 2

## 2017-08-12 MED ORDER — COLLAGENASE 250 UNIT/GM EX OINT
1.0000 "application " | TOPICAL_OINTMENT | Freq: Every day | CUTANEOUS | Status: DC
  Administered 2017-08-12 – 2017-08-14 (×3): 1 via TOPICAL
  Filled 2017-08-12: qty 30

## 2017-08-12 MED ORDER — ACETAMINOPHEN 325 MG PO TABS: 325.0000 mg | ORAL_TABLET | ORAL | Status: DC | PRN

## 2017-08-12 MED ORDER — SUGAMMADEX SODIUM 200 MG/2ML IV SOLN
INTRAVENOUS | Status: DC | PRN
  Administered 2017-08-12: 200 mg via INTRAVENOUS

## 2017-08-12 MED ORDER — GUAIFENESIN-DM 100-10 MG/5ML PO SYRP: 15.0000 mL | ORAL_SOLUTION | ORAL | Status: DC | PRN

## 2017-08-12 MED ORDER — PANTOPRAZOLE SODIUM 40 MG PO TBEC
40.0000 mg | DELAYED_RELEASE_TABLET | Freq: Every day | ORAL | Status: DC
  Administered 2017-08-12 – 2017-08-15 (×4): 40 mg via ORAL
  Filled 2017-08-12 (×4): qty 1

## 2017-08-12 MED ORDER — HYDRALAZINE HCL 20 MG/ML IJ SOLN: 5.0000 mg | INTRAMUSCULAR | Status: DC | PRN

## 2017-08-12 MED ORDER — ALUM & MAG HYDROXIDE-SIMETH 200-200-20 MG/5ML PO SUSP: 15.0000 mL | ORAL | Status: DC | PRN

## 2017-08-12 MED ORDER — ONDANSETRON HCL 4 MG/2ML IJ SOLN
INTRAMUSCULAR | Status: DC | PRN
  Administered 2017-08-12: 4 mg via INTRAVENOUS

## 2017-08-12 MED ORDER — POTASSIUM CHLORIDE CRYS ER 20 MEQ PO TBCR: 20.0000 meq | EXTENDED_RELEASE_TABLET | Freq: Every day | ORAL | Status: DC | PRN

## 2017-08-12 MED ORDER — PHENOL 1.4 % MT LIQD: 1.0000 | OROMUCOSAL | Status: DC | PRN

## 2017-08-12 MED ORDER — MIDAZOLAM HCL 2 MG/2ML IJ SOLN
INTRAMUSCULAR | Status: AC
  Filled 2017-08-12: qty 2

## 2017-08-12 SURGICAL SUPPLY — 46 items
BANDAGE ACE 4X5 VEL STRL LF (GAUZE/BANDAGES/DRESSINGS) ×3 IMPLANT
BANDAGE ACE 6X5 VEL STRL LF (GAUZE/BANDAGES/DRESSINGS) ×3 IMPLANT
BANDAGE ELASTIC 4 VELCRO ST LF (GAUZE/BANDAGES/DRESSINGS) ×3 IMPLANT
BLADE MIC 41X13 (BLADE) ×2 IMPLANT
BLADE MIC 41X13MM (BLADE) ×1
BLADE OSCILLATING /SAGITTAL (BLADE) ×3 IMPLANT
BLADE SAW GIGLI 510 (BLADE) IMPLANT
BLADE SAW GIGLI 510MM (BLADE)
BNDG COHESIVE 6X5 TAN STRL LF (GAUZE/BANDAGES/DRESSINGS) ×3 IMPLANT
BNDG GAUZE ELAST 4 BULKY (GAUZE/BANDAGES/DRESSINGS) ×3 IMPLANT
CANISTER SUCT 3000ML PPV (MISCELLANEOUS) ×3 IMPLANT
CLIP VESOCCLUDE MED 6/CT (CLIP) ×3 IMPLANT
COVER SURGICAL LIGHT HANDLE (MISCELLANEOUS) ×3 IMPLANT
DRAIN CHANNEL 19F RND (DRAIN) IMPLANT
DRAPE HALF SHEET 40X57 (DRAPES) ×3 IMPLANT
DRAPE ORTHO SPLIT 77X108 STRL (DRAPES) ×4
DRAPE SURG ORHT 6 SPLT 77X108 (DRAPES) ×2 IMPLANT
DRSG ADAPTIC 3X8 NADH LF (GAUZE/BANDAGES/DRESSINGS) ×3 IMPLANT
ELECT CAUTERY BLADE 6.4 (BLADE) ×3 IMPLANT
ELECT REM PT RETURN 9FT ADLT (ELECTROSURGICAL) ×3
ELECTRODE REM PT RTRN 9FT ADLT (ELECTROSURGICAL) ×1 IMPLANT
EVACUATOR SILICONE 100CC (DRAIN) IMPLANT
GAUZE SPONGE 4X4 12PLY STRL (GAUZE/BANDAGES/DRESSINGS) ×3 IMPLANT
GLOVE BIO SURGEON STRL SZ7.5 (GLOVE) ×3 IMPLANT
GOWN STRL REUS W/ TWL LRG LVL3 (GOWN DISPOSABLE) ×2 IMPLANT
GOWN STRL REUS W/ TWL XL LVL3 (GOWN DISPOSABLE) ×1 IMPLANT
GOWN STRL REUS W/TWL LRG LVL3 (GOWN DISPOSABLE) ×4
GOWN STRL REUS W/TWL XL LVL3 (GOWN DISPOSABLE) ×2
KIT BASIN OR (CUSTOM PROCEDURE TRAY) ×3 IMPLANT
KIT ROOM TURNOVER OR (KITS) ×3 IMPLANT
NS IRRIG 1000ML POUR BTL (IV SOLUTION) ×3 IMPLANT
PACK GENERAL/GYN (CUSTOM PROCEDURE TRAY) ×3 IMPLANT
PAD ARMBOARD 7.5X6 YLW CONV (MISCELLANEOUS) ×6 IMPLANT
SPONGE LAP 18X18 X RAY DECT (DISPOSABLE) ×6 IMPLANT
STAPLER VISISTAT 35W (STAPLE) ×3 IMPLANT
STOCKINETTE IMPERVIOUS LG (DRAPES) ×3 IMPLANT
SUT ETHILON 3 0 PS 1 (SUTURE) IMPLANT
SUT SILK 0 TIES 10X30 (SUTURE) ×3 IMPLANT
SUT SILK 2 0 (SUTURE) ×2
SUT SILK 2-0 18XBRD TIE 12 (SUTURE) ×1 IMPLANT
SUT SILK 3 0 (SUTURE)
SUT SILK 3-0 18XBRD TIE 12 (SUTURE) IMPLANT
SUT VIC AB 2-0 CT1 18 (SUTURE) ×9 IMPLANT
TOWEL GREEN STERILE (TOWEL DISPOSABLE) ×3 IMPLANT
UNDERPAD 30X30 (UNDERPADS AND DIAPERS) ×3 IMPLANT
WATER STERILE IRR 1000ML POUR (IV SOLUTION) IMPLANT

## 2017-08-12 NOTE — Op Note (Signed)
    Patient name: Danielle Buchanan MRN: [id number] DOB: 1956/01/30 Sex: female  08/12/2017 Pre-operative Diagnosis: necrosis of left below knee amputation Post-operative diagnosis:  Same Surgeon:  Luanna SalkBrandon C. Randie Heinzain, MD Assistant: Lianne CureMaureen Collins, PA Procedure Performed: Revision of left below knee amputation  Indications:  61 year old female recently underwent left below-knee amputation but certainly had a fall onto the amputation site. She was in rehabilitation took herself home and a surgical he had breakdown of her wound is indicated for revision of her below-knee of amputation on the left versus conversion to above-the-knee amputation.  Findings: After the necrotic tissue was debrided there was healthy tissue with adequate bleeding muscle. Patient the skin edges were approximated without excessive tension although the below-knee amputation is quite sure now after resecting 6 cm of bone of both the tibia and fibula.   Procedure:  The patient was identified in the holding area and taken to the operating room where she was placed supine on the operating table and general anesthesia was induced. She was sterilely prepped and draped in the left lower extremity and timeout called. Antibodies were administered prior to incision. We initially started with an incision above the below-knee dictation line. This down to the bone. We then traced a new below-knee indication incision. The wound was dissected free with cautery both the tibia and fibula. The tibia was transected 6 cm higher than previous as well as the fibula which was approximately 1 cm higher than tibial transection. We did have to ligate the anterior tibial vessels. The soleus muscle was then fully excised using cautery. The necrotic skin tissue was excised using scissors back to bleeding tissue. Hemostasis was obtained in the wound we irrigated. We then reapproximated the fascia with 2-0 Vicryl sutures followed by the skin with staples. A dry dressing  was then placed. Patient tolerated procedure well without immediate complication. All counts were correct at completion. Next  EBL 200 mL.    Asiel Chrostowski C. Randie Heinzain, MD [facility] [place] Office: [phone] Pager: [phone]

## 2017-08-12 NOTE — Plan of Care (Signed)
  Education: Knowledge of General Education information will improve 08/12/2017 2325 - Progressing by Parke PoissonWade, Merdith Adan N, RN 08/12/2017 2324 - Progressing by Parke PoissonWade, Kalin Kyler N, RN   Activity: Risk for activity intolerance will decrease 08/12/2017 2325 - Progressing by Parke PoissonWade, Demarrion Meiklejohn N, RN 08/12/2017 2324 - Progressing by Parke PoissonWade, Shyonna Carlin N, RN   Pain Managment: General experience of comfort will improve 08/12/2017 2325 - Progressing by Parke PoissonWade, Zackarey Holleman N, RN 08/12/2017 2324 - Progressing by Parke PoissonWade, Tanekia Ryans N, RN   Safety: Ability to remain free from injury will improve 08/12/2017 2325 - Progressing by Parke PoissonWade, Dayln Tugwell N, RN

## 2017-08-12 NOTE — Anesthesia Postprocedure Evaluation (Signed)
Anesthesia Post Note  Patient: Danielle Buchanan  Procedure(s) Performed: REVISION BELOW KNEE (Left )     Patient location during evaluation: PACU Anesthesia Type: General Level of consciousness: awake and alert Pain management: pain level not controlled (continued c pain complaints despite above average narcotic dose) Vital Signs Assessment: post-procedure vital signs reviewed and stable Respiratory status: spontaneous breathing, nonlabored ventilation, respiratory function stable and patient connected to nasal cannula oxygen Cardiovascular status: blood pressure returned to baseline and stable Postop Assessment: no apparent nausea or vomiting Anesthetic complications: no    Last Vitals:  Vitals:   08/12/17 1452 08/12/17 1507  BP: (!) 169/77 (!) 166/75  Pulse: 84 89  Resp: 14 15  Temp:  (!) 36.3 C  SpO2: 93% 92%    Last Pain:  Vitals:   08/12/17 1437  TempSrc:   PainSc: 10-Worst pain ever                 [name],JAMES TERRILL

## 2017-08-12 NOTE — Transfer of Care (Signed)
Immediate Anesthesia Transfer of Care Note  Patient: Danielle Buchanan  Procedure(s) Performed: REVISION BELOW KNEE (Left )  Patient Location: PACU  Anesthesia Type:General  Level of Consciousness: awake and alert   Airway & Oxygen Therapy: Patient Spontanous Breathing  Post-op Assessment: Report given to RN and Post -op Vital signs reviewed and stable  Post vital signs: Reviewed and stable  Last Vitals:  Vitals:   08/12/17 0837 08/12/17 1337  BP: (!) 168/74 (!) 157/92  Pulse: 66 70  Resp: 18 14  Temp: 36.7 C (!) 36.2 C  SpO2: 97% 96%    Last Pain:  Vitals:   08/12/17 0922  TempSrc:   PainSc: 0-No pain      Patients Stated Pain Goal: 3 (08/12/17 [id number])  Complications: No apparent anesthesia complications

## 2017-08-12 NOTE — Anesthesia Preprocedure Evaluation (Addendum)
Anesthesia Evaluation  Patient identified by MRN, date of birth, ID band Patient awake    Reviewed: Allergy & Precautions, NPO status , Patient's Chart, lab work & pertinent test results  Airway Mallampati: II  TM Distance: >3 FB Neck ROM: Full    Dental  (+) Teeth Intact, Dental Advisory Given   Pulmonary former smoker,    breath sounds clear to auscultation       Cardiovascular hypertension, + Peripheral Vascular Disease   Rhythm:Regular Rate:Normal     Neuro/Psych    GI/Hepatic   Endo/Other  diabetes  Renal/GU      Musculoskeletal   Abdominal (+) + obese,   Peds  Hematology   Anesthesia Other Findings   Reproductive/Obstetrics                             Anesthesia Physical  Anesthesia Plan  ASA: III  Anesthesia Plan: General   Post-op Pain Management:    Induction: Intravenous  PONV Risk Score and Plan: 3 and Ondansetron and Metaclopromide  Airway Management Planned: Oral ETT  Additional Equipment:   Intra-op Plan:   Post-operative Plan: Extubation in OR  Informed Consent: I have reviewed the patients History and Physical, chart, labs and discussed the procedure including the risks, benefits and alternatives for the proposed anesthesia with the patient or authorized representative who has indicated his/her understanding and acceptance.   Dental advisory given  Plan Discussed with: CRNA and Anesthesiologist  Anesthesia Plan Comments:         Anesthesia Quick Evaluation

## 2017-08-12 NOTE — Anesthesia Procedure Notes (Signed)
Procedure Name: Intubation Date/Time: 08/12/2017 11:42 AM Performed by: Purvis Kilts, CRNA Pre-anesthesia Checklist: Patient identified, Emergency Drugs available, Suction available, Patient being monitored and Timeout performed Patient Re-evaluated:Patient Re-evaluated prior to induction Oxygen Delivery Method: Circle system utilized Preoxygenation: Pre-oxygenation with 100% oxygen Induction Type: IV induction Ventilation: Mask ventilation without difficulty Laryngoscope Size: Mac and 3 Grade View: Grade II Tube type: Oral Tube size: 7.0 mm Number of attempts: 1 Airway Equipment and Method: Stylet Placement Confirmation: ETT inserted through vocal cords under direct vision,  breath sounds checked- equal and bilateral and positive ETCO2 Secured at: 21 cm Tube secured with: Tape Dental Injury: Teeth and Oropharynx as per pre-operative assessment

## 2017-08-12 NOTE — H&P (Signed)
   History and Physical Update  The patient was interviewed and re-examined.  The patient's previous History and Physical has been reviewed and is unchanged from recent office visit. Plan for revision of left bka vs conversion to left aka.   Nevae Pinnix C. Randie Heinzain, MD [facility] Office: [phone] Pager: [phone]   08/12/2017, 9:00 AM

## 2017-08-13 ENCOUNTER — Encounter (HOSPITAL_COMMUNITY): Payer: Self-pay | Admitting: Vascular Surgery

## 2017-08-13 LAB — BASIC METABOLIC PANEL
ANION GAP: 11 (ref 5–15)
BUN: 10 mg/dL (ref 6–20)
CHLORIDE: 97 mmol/L — AB (ref 101–111)
CO2: 23 mmol/L (ref 22–32)
CREATININE: 0.88 mg/dL (ref 0.44–1.00)
Calcium: 8.5 mg/dL — ABNORMAL LOW (ref 8.9–10.3)
GFR calc non Af Amer: >60 mL/min (ref >60)
Glucose, Bld: 327 mg/dL — ABNORMAL HIGH (ref 65–99)
POTASSIUM: 5.2 mmol/L — AB (ref 3.5–5.1)
SODIUM: 131 mmol/L — AB (ref 135–145)

## 2017-08-13 LAB — CBC
HCT: 33.2 % — ABNORMAL LOW (ref 36.0–46.0)
HEMOGLOBIN: 10.7 g/dL — AB (ref 12.0–15.0)
MCH: 27.9 pg (ref 26.0–34.0)
MCHC: 32.2 g/dL (ref 30.0–36.0)
MCV: 86.5 fL (ref 78.0–100.0)
PLATELETS: 273 10*3/uL (ref 150–400)
RBC: 3.84 MIL/uL — AB (ref 3.87–5.11)
RDW: 14.2 % (ref 11.5–15.5)
WBC: 13.7 10*3/uL — AB (ref 4.0–10.5)

## 2017-08-13 LAB — GLUCOSE, CAPILLARY
GLUCOSE-CAPILLARY: 268 mg/dL — AB (ref 65–99)
GLUCOSE-CAPILLARY: 335 mg/dL — AB (ref 65–99)
Glucose-Capillary: 329 mg/dL — ABNORMAL HIGH (ref 65–99)
Glucose-Capillary: 294 mg/dL — ABNORMAL HIGH (ref 65–99)

## 2017-08-13 MED ORDER — HYDROMORPHONE HCL 1 MG/ML IJ SOLN
0.5000 mg | INTRAMUSCULAR | Status: DC | PRN
  Administered 2017-08-14 – 2017-08-15 (×2): 0.5 mg via INTRAVENOUS
  Filled 2017-08-13 (×2): qty 0.5

## 2017-08-13 NOTE — Evaluation (Signed)
Occupational Therapy Evaluation Patient Details Name: Danielle Buchanan MRN: [id number] DOB: 18-Mar-1956 Today's Date: 08/13/2017    History of Present Illness Pt is a 61 y.o. female admitted 08/12/17 for revision of recent left BKA secondary to fall on amputation site; original BKA was 07/23/17. Pertinent PMH includes HTN, DM, HLD, OSA, depression.    Clinical Impression   PTA, pt had recently returned home following short-term SNF stay for rehabilitation. She reports modified independence with ADL transfers dressing tasks at home but requiring assistance from her sister for bathing tasks. Prior to initial BKA on 07/23/17, pt reports independence with ADL overall. She currently requires mod assist +2 for toilet transfers, min assist for seated UB ADL, and max assist for LB ADL. She presents with post-operative pain and decreased balance as well as decreased safety awareness impacting her independence with ADL and functional mobility. Pt would benefit from continued OT services while admitted to improve independence and safety with ADL and functional mobility. Recommend CIR level rehabilitation to maximize return to independent PLOF. Pt very motivated to improve her independence.     Follow Up Recommendations  CIR;Supervision/Assistance - 24 hour    Equipment Recommendations  Other (comment)(TBD at next venue )    Recommendations for Other Services Rehab consult     Precautions / Restrictions Precautions Precautions: Fall Precaution Comments: L BKA Restrictions Weight Bearing Restrictions: Yes LLE Weight Bearing: Non weight bearing      Mobility Bed Mobility Overal bed mobility: Needs Assistance Bed Mobility: Rolling;Sidelying to Sit Rolling: Min assist Sidelying to sit: Mod assist;+2 for physical assistance;HOB elevated       General bed mobility comments: Reliant on modA+2 to assist trunk into elevation, pt reliant on bed rail support; once sitting, pt required min-maxA(+2) for  sitting balance. Reports she cannot stay sitting indep secondary to "feeling woozy" after receiving morphine  Transfers Overall transfer level: Needs assistance Equipment used: None Transfers: Squat Pivot Transfers     Squat pivot transfers: Mod assist;+2 physical assistance     General transfer comment: ModA+2 to squat pivot from bed to chair on RLE. Pt initially attempting an unsafe transfer, requiring cues to reset in order to transfer safely. Assist to maintain balance and cues for sequencing    Balance Overall balance assessment: Needs assistance Sitting-balance support: No upper extremity supported Sitting balance-Leahy Scale: Fair Sitting balance - Comments: Able to sit EOB with min guard for <1 min, requiring intermittent min-maxA for seated balance as pt will fall into supine on bed Postural control: Posterior lean                                 ADL either performed or assessed with clinical judgement   ADL Overall ADL's : Needs assistance/impaired Eating/Feeding: Set up;Sitting Eating/Feeding Details (indicate cue type and reason): sitting with back support Grooming: Min guard;Sitting   Upper Body Bathing: Minimal assistance;Sitting   Lower Body Bathing: Sitting/lateral leans;Maximal assistance   Upper Body Dressing : Minimal assistance;Sitting   Lower Body Dressing: Maximal assistance;Sitting/lateral leans   Toilet Transfer: Moderate assistance;Squat-pivot;+2 for physical assistance   Toileting- Clothing Manipulation and Hygiene: Maximal assistance;Sitting/lateral lean         General ADL Comments: Pt with decreased safety awareness. She reports her highest priority is making sure her dog is cared for.      Vision   Vision Assessment?: No apparent visual deficits     Perception  Praxis      Pertinent Vitals/Pain Pain Assessment: 0-10 Pain Score: 9 (reports 9.5) Faces Pain Scale: Hurts whole lot Pain Location: L LE Pain  Descriptors / Indicators: Aching;Grimacing Pain Intervention(s): Monitored during session     Hand Dominance Right   Extremity/Trunk Assessment Upper Extremity Assessment Upper Extremity Assessment: Generalized weakness   Lower Extremity Assessment Lower Extremity Assessment: Defer to PT evaluation RLE Sensation: history of peripheral neuropathy LLE Deficits / Details: s/p L BKA revision; L hip flexion 3/5 LLE: Unable to fully assess due to pain       Communication Communication Communication: No difficulties   Cognition Arousal/Alertness: Awake/alert Behavior During Therapy: WFL for tasks assessed/performed Overall Cognitive Status: Impaired/Different from baseline Area of Impairment: Safety/judgement                         Safety/Judgement: Decreased awareness of safety;Decreased awareness of deficits     General Comments: Pt with poor understanding of need for assistance and functional deficits.    General Comments  Performed L knee extension stretching; pt tolerated this poorly secondary to pain. Educ on resting L knee in extension and bilateral hip flexor stretching    Exercises     Shoulder Instructions      Home Living Family/patient expects to be discharged to:: Private residence Living Arrangements: Other relatives(sister) Available Help at Discharge: Family;Available 24 hours/day Type of Home: Apartment Home Access: Level entry     Home Layout: One level     Bathroom Shower/Tub: Chief Strategy OfficerTub/shower unit   Bathroom Toilet: Standard Bathroom Accessibility: Yes   Home Equipment: Walker - 4 wheels;Wheelchair - manual      Lives With: Family    Prior Functioning/Environment Level of Independence: Needs assistance  Gait / Transfers Assistance Needed: Mod indep for squat pivot transfers to/from w/c ADL's / Homemaking Assistance Needed: Pt dresses herself. Reliant on assist from sister for bathing and other ADLs            OT Problem List:  Decreased strength;Decreased range of motion;Impaired balance (sitting and/or standing);Pain;Decreased knowledge of use of DME or AE;Decreased activity tolerance;Decreased safety awareness;Decreased knowledge of precautions      OT Treatment/Interventions: Self-care/ADL training;Therapeutic exercise;Energy conservation;DME and/or AE instruction;Therapeutic activities;Patient/family education;Balance training    OT Goals(Current goals can be found in the care plan section) Acute Rehab OT Goals Patient Stated Goal: To return home as soon as possible OT Goal Formulation: With patient Time For Goal Achievement: 08/27/17 Potential to Achieve Goals: Good ADL Goals Pt Will Perform Grooming: with set-up;sitting Pt Will Perform Upper Body Dressing: with set-up;sitting Pt Will Perform Lower Body Dressing: with min guard assist;sitting/lateral leans Pt Will Transfer to Toilet: with min guard assist;squat pivot transfer;bedside commode(drop arm BSC) Pt Will Perform Toileting - Clothing Manipulation and hygiene: with supervision;sitting/lateral leans  OT Frequency: Min 3X/week   Barriers to D/C:            Co-evaluation PT/OT/SLP Co-Evaluation/Treatment: Yes Reason for Co-Treatment: For patient/therapist safety;To address functional/ADL transfers PT goals addressed during session: Mobility/safety with mobility;Balance OT goals addressed during session: ADL's and self-care      AM-PAC PT "6 Clicks" Daily Activity     Outcome Measure Help from another person eating meals?: A Little Help from another person taking care of personal grooming?: A Little Help from another person toileting, which includes using toliet, bedpan, or urinal?: A Lot Help from another person bathing (including washing, rinsing, drying)?: A Lot Help from another person  to put on and taking off regular upper body clothing?: A Little Help from another person to put on and taking off regular lower body clothing?: A Lot 6  Click Score: 15   End of Session Equipment Utilized During Treatment: Gait belt Nurse Communication: Mobility status  Activity Tolerance: Patient tolerated treatment well Patient left: with call bell/phone within reach;in chair  OT Visit Diagnosis: Other abnormalities of gait and mobility (R26.89);Muscle weakness (generalized) (M62.81);Pain Pain - Right/Left: Left Pain - part of body: Leg                Time: [id number] OT Time Calculation (min): 21 min Charges:  OT General Charges $OT Visit: 1 Visit OT Evaluation $OT Eval Moderate Complexity: 1 Mod G-Codes:     Doristine Sectionharity A Sandy Haye, MS OTR/L  Pager: [phone]   Avana Kreiser A Shaquisha Wynn 08/13/2017, 11:12 AM

## 2017-08-13 NOTE — Evaluation (Signed)
Physical Therapy Evaluation Patient Details Name: Danielle Buchanan ClassJanice Meinecke MRN: [id number] DOB: August 23, 1956 Today's Date: 08/13/2017   History of Present Illness  Pt is a 61 y.o. female admitted 08/12/17 for revision of recent left BKA secondary to fall on amputation site; original BKA was 07/23/17. Pertinent PMH includes HTN, DM, HLD, OSA, depression.     Clinical Impression  Pt presents with pain and an overall decrease in functional mobility secondary to above. PTA, pt had been receiving rehab at SNF since her initial BKA on 07/23/17, but recently discharged home; at home, as been mod indep with squat pivot transfers to w/c, requiring assist for ADLs from sister. Educ on precautions, positioning, therex, and importance of mobility. Today, pt able to required modA (+2 safety) for squat pivot from bed to chair; pt with poor sitting balance, requiring intermittent min-maxA to correct upright posture. Pt initially reports wanting to return home with HHPT, but is willing to consider continued rehab prior to d/c with max education on PT/OT recommendation for this. Feel pt would benefit from CIR-level therapies to maximize functional mobility and independence. Will continue to follow acutely to address established goals.    Follow Up Recommendations CIR;Supervision for mobility/OOB    Equipment Recommendations  Other (comment)(TBD)    Recommendations for Other Services Rehab consult     Precautions / Restrictions Precautions Precautions: Fall Precaution Comments: L BKA Restrictions Weight Bearing Restrictions: Yes LLE Weight Bearing: Non weight bearing      Mobility  Bed Mobility Overal bed mobility: Needs Assistance Bed Mobility: Rolling;Sidelying to Sit Rolling: Min assist Sidelying to sit: Mod assist;+2 for physical assistance;HOB elevated       General bed mobility comments: Reliant on modA+2 to assist trunk into elevation, pt reliant on bed rail support; once sitting, pt required  min-maxA(+2) for sitting balance. Reports she cannot stay sitting indep secondary to "feeling woozy" after receiving morphine  Transfers Overall transfer level: Needs assistance Equipment used: None Transfers: Squat Pivot Transfers     Squat pivot transfers: Mod assist;+2 safety/equipment     General transfer comment: ModA+2 to squat pivot from bed to chair on RLE. Pt initially attempting an unsafe transfer, requiring cues to reset in order to transfer safely. Assist to maintain balance and cues for sequencing  Ambulation/Gait                Stairs            Wheelchair Mobility    Modified Rankin (Stroke Patients Only)       Balance Overall balance assessment: Needs assistance Sitting-balance support: No upper extremity supported Sitting balance-Leahy Scale: Fair Sitting balance - Comments: Able to sit EOB with min guard for <1 min, requiring intermittent min-maxA for seated balance as pt will fall into supine on bed Postural control: Posterior lean                                   Pertinent Vitals/Pain Pain Assessment: Faces Faces Pain Scale: Hurts whole lot Pain Location: L LE Pain Descriptors / Indicators: Aching;Grimacing Pain Intervention(s): Monitored during session;Repositioned;Premedicated before session    Home Living Family/patient expects to be discharged to:: Private residence Living Arrangements: Other relatives(sister) Available Help at Discharge: Family;Available 24 hours/day Type of Home: Apartment Home Access: Level entry     Home Layout: One level Home Equipment: Walker - 4 wheels;Wheelchair - manual      Prior Function Level of Independence:  Needs assistance   Gait / Transfers Assistance Needed: Mod indep for squat pivot transfers to/from w/c  ADL's / Homemaking Assistance Needed: Pt dresses herself. Reliant on assist from sister for bathing and other ADLs        Hand Dominance   Dominant Hand: Right     Extremity/Trunk Assessment   Upper Extremity Assessment Upper Extremity Assessment: Generalized weakness    Lower Extremity Assessment Lower Extremity Assessment: LLE deficits/detail;Generalized weakness;RLE deficits/detail RLE Sensation: history of peripheral neuropathy LLE Deficits / Details: s/p L BKA revision; L hip flexion 3/5 LLE: Unable to fully assess due to pain       Communication   Communication: No difficulties  Cognition Arousal/Alertness: Awake/alert Behavior During Therapy: WFL for tasks assessed/performed Overall Cognitive Status: Within Functional Limits for tasks assessed                                        General Comments General comments (skin integrity, edema, etc.): Performed L knee extension stretching; pt tolerated this poorly secondary to pain. Educ on resting L knee in extension and bilateral hip flexor stretching    Exercises     Assessment/Plan    PT Assessment Patient needs continued PT services  PT Problem List Decreased strength;Decreased activity tolerance;Decreased balance;Decreased mobility;Decreased knowledge of use of DME;Decreased knowledge of precautions;Impaired sensation;Obesity;Decreased skin integrity;Pain       PT Treatment Interventions DME instruction;Gait training;Functional mobility training;Therapeutic activities;Therapeutic exercise;Balance training;Patient/family education;Wheelchair mobility training    PT Goals (Current goals can be found in the Care Plan section)  Acute Rehab PT Goals Patient Stated Goal: To return home as soon as possible PT Goal Formulation: With patient Time For Goal Achievement: 08/27/17 Potential to Achieve Goals: Fair    Frequency Min 3X/week   Barriers to discharge        Co-evaluation PT/OT/SLP Co-Evaluation/Treatment: Yes Reason for Co-Treatment: For patient/therapist safety;To address functional/ADL transfers PT goals addressed during session: Mobility/safety with  mobility;Balance         AM-PAC PT "6 Clicks" Daily Activity  Outcome Measure Difficulty turning over in bed (including adjusting bedclothes, sheets and blankets)?: Unable Difficulty moving from lying on back to sitting on the side of the bed? : Unable Difficulty sitting down on and standing up from a chair with arms (e.g., wheelchair, bedside commode, etc,.)?: Unable Help needed moving to and from a bed to chair (including a wheelchair)?: A Lot Help needed walking in hospital room?: A Lot Help needed climbing 3-5 steps with a railing? : Total 6 Click Score: 8    End of Session Equipment Utilized During Treatment: Gait belt Activity Tolerance: Patient tolerated treatment well;Patient limited by pain Patient left: in chair;with call bell/phone within reach Nurse Communication: Mobility status PT Visit Diagnosis: Difficulty in walking, not elsewhere classified (R26.2);Pain Pain - Right/Left: Left Pain - part of body: Leg    Time: [id number] PT Time Calculation (min) (ACUTE ONLY): 21 min   Charges:   PT Evaluation $PT Eval Moderate Complexity: 1 Mod     PT G Codes:       Ina HomesJaclyn Ella Guillotte, PT, DPT Acute Rehab Services  Pager: [phone]  Malachy ChamberJaclyn L Deryn Massengale 08/13/2017, 10:41 AM

## 2017-08-13 NOTE — Progress Notes (Signed)
Inpatient Diabetes Program Recommendations  AACE/ADA: New Consensus Statement on Inpatient Glycemic Control (2015)  Target Ranges:  Prepandial:   less than 140 mg/dL      Peak postprandial:   less than 180 mg/dL (1-2 hours)      Critically ill patients:  140 - 180 mg/dL   Lab Results  Component Value Date   GLUCAP 329 (H) 08/13/2017   HGBA1C 9.3 (H) 08/12/2017    Review of Glycemic Control Results for Danielle Buchanan, [name] (MRN [id number]) as of 08/13/2017 10:31  Ref. Range 08/12/2017 10:44 08/12/2017 13:27 08/12/2017 16:29 08/12/2017 21:53 08/13/2017 06:20  Glucose-Capillary Latest Ref Range: 65 - 99 mg/dL 332147 (H) 951197 (H) 884220 (H) 288 (H) 329 (H)   Diabetes history: DM2 Outpatient Diabetes medications: Novolin 70/30 insulin mix 90 units bid Current orders for Inpatient glycemic control: Novolog correction sensitive tid  Inpatient Diabetes Program Recommendations:   -Add Novolog 70/30 45 units bid -Add Novolog 0-5 units hs correction  Thank you, Darel HongJudy E. Kelsha Older, RN, MSN, CDE  Diabetes Coordinator Inpatient Glycemic Control Team Team Pager [phone] (8am-5pm) 08/13/2017 10:33 AM

## 2017-08-13 NOTE — Progress Notes (Signed)
Rehab Admissions Coordinator Note:  Patient was screened by Trish MageLogue, Burch Marchuk M for appropriateness for an Inpatient Acute Rehab Consult.  At this time, we are [facility].  Patient was seen by inpatient rehab around 07/26/17 and we received a denial for acute inpatient rehab from insurance [facility]. Hence, patient went to a SNF after initial amputation.   It is highly unlikely that [facility] would approve inpatient rehab admission for currently diagnosis.  Trish MageLogue, Neala Miggins M 08/13/2017, 10:54 AM  I can be reached at [phone].

## 2017-08-13 NOTE — Care Management Note (Signed)
Case Management Note Donn PieriniKristi Simaya Lumadue RN, BSN Unit 4E-Case Manager [phone]  Patient Details  Name: Danielle ClassJanice Buchanan MRN: [id number] Date of Birth: 04-30-1956  Subjective/Objective:  Pt admitted s/p revision of right BKA (hx falls at home)                   Action/Plan: PTA pt lived at home- has sister that assist minimally- on last admit had recommendation for CIR- however insurance did not approve for admission and [facility]- left SNF and went home.- CSW to f/u with pt for possible [facility] CM and CSW to follow.    Expected Discharge Date:                  Expected Discharge Plan:  Skilled Nursing Facility  In-House Referral:  Clinical Social Work  Discharge planning Services  CM Consult  Post Acute Care Choice:    Choice offered to:     DME Arranged:    DME Agency:     HH Arranged:    HH Agency:     Status of Service:  In process, will continue to follow  If discussed at Long Length of Stay Meetings, dates discussed:    Discharge Disposition:   Additional Comments:  Darrold SpanWebster, Malea Swilling Hall, RN 08/13/2017, 2:36 PM

## 2017-08-13 NOTE — Progress Notes (Addendum)
[facility] [place]  Subjective  - Pain, but over all doing OK.   Objective 133/78 85 98.9 F (37.2 C) (Oral) (!) 27 97%  Intake/Output Summary (Last 24 hours) at 08/13/2017 0741 Last data filed at 08/13/2017 0622 Gross per 24 hour  Intake 1170 ml  Output 390 ml  Net 780 ml    Left BKA dressing clean and dry   Assessment/Planning: POD # 1 revision left BKA  Plan to change dressing tomorrow WBC 13.7  Clinton GallantCOLLINS, Danielle [facility] Center[facility]EN 08/13/2017 7:41 AM --  Laboratory Lab Results: Recent Labs    08/12/17 1537 08/13/17 0256  WBC 10.7* 13.7*  HGB 11.9* 10.7*  HCT 37.8 33.2*  PLT 263 273   BMET Recent Labs    08/12/17 1005 08/12/17 1537 08/13/17 0256  NA 136  --  131*  K 4.8  --  5.2*  CL 103  --  97*  CO2 23  --  23  GLUCOSE 153*  --  327*  BUN 9  --  10  CREATININE 0.68 0.78 0.88  CALCIUM 9.1  --  8.5*    COAG Lab Results  Component Value Date   INR 0.92 06/09/2017   No results found for: PTT   I have independently interviewed and examined patient and agree with PA assessment and plan above.  Ree Alcalde C. Randie Heinzain, MD [facility] [place] Office: [phone] Pager: [phone]

## 2017-08-14 ENCOUNTER — Other Ambulatory Visit: Payer: Self-pay

## 2017-08-14 ENCOUNTER — Encounter (HOSPITAL_BASED_OUTPATIENT_CLINIC_OR_DEPARTMENT_OTHER): Payer: Medicare HMO | Attending: Surgery

## 2017-08-14 DIAGNOSIS — I10 Essential (primary) hypertension: Secondary | ICD-10-CM | POA: Insufficient documentation

## 2017-08-14 DIAGNOSIS — L97512 Non-pressure chronic ulcer of other part of right foot with fat layer exposed: Secondary | ICD-10-CM | POA: Insufficient documentation

## 2017-08-14 DIAGNOSIS — Z794 Long term (current) use of insulin: Secondary | ICD-10-CM | POA: Insufficient documentation

## 2017-08-14 DIAGNOSIS — E1152 Type 2 diabetes mellitus with diabetic peripheral angiopathy with gangrene: Secondary | ICD-10-CM | POA: Insufficient documentation

## 2017-08-14 DIAGNOSIS — Z87891 Personal history of nicotine dependence: Secondary | ICD-10-CM | POA: Insufficient documentation

## 2017-08-14 DIAGNOSIS — E1140 Type 2 diabetes mellitus with diabetic neuropathy, unspecified: Secondary | ICD-10-CM | POA: Insufficient documentation

## 2017-08-14 DIAGNOSIS — Z89512 Acquired absence of left leg below knee: Secondary | ICD-10-CM | POA: Insufficient documentation

## 2017-08-14 DIAGNOSIS — Z79899 Other long term (current) drug therapy: Secondary | ICD-10-CM | POA: Insufficient documentation

## 2017-08-14 DIAGNOSIS — E11621 Type 2 diabetes mellitus with foot ulcer: Secondary | ICD-10-CM | POA: Insufficient documentation

## 2017-08-14 LAB — BASIC METABOLIC PANEL
ANION GAP: 7 (ref 5–15)
BUN: 7 mg/dL (ref 6–20)
CHLORIDE: 94 mmol/L — AB (ref 101–111)
CO2: 28 mmol/L (ref 22–32)
CREATININE: 0.82 mg/dL (ref 0.44–1.00)
Calcium: 8.3 mg/dL — ABNORMAL LOW (ref 8.9–10.3)
GFR calc non Af Amer: >60 mL/min (ref >60)
GLUCOSE: 309 mg/dL — AB (ref 65–99)
Potassium: 3.7 mmol/L (ref 3.5–5.1)
Sodium: 129 mmol/L — ABNORMAL LOW (ref 135–145)

## 2017-08-14 LAB — GLUCOSE, CAPILLARY
Glucose-Capillary: 329 mg/dL — ABNORMAL HIGH (ref 65–99)
Glucose-Capillary: 388 mg/dL — ABNORMAL HIGH (ref 65–99)

## 2017-08-14 LAB — CBC
HCT: 31.7 % — ABNORMAL LOW (ref 36.0–46.0)
HEMOGLOBIN: 10.3 g/dL — AB (ref 12.0–15.0)
MCH: 27.7 pg (ref 26.0–34.0)
MCHC: 32.5 g/dL (ref 30.0–36.0)
MCV: 85.2 fL (ref 78.0–100.0)
Platelets: 186 10*3/uL (ref 150–400)
RBC: 3.72 MIL/uL — AB (ref 3.87–5.11)
RDW: 14.2 % (ref 11.5–15.5)
WBC: 12.4 10*3/uL — ABNORMAL HIGH (ref 4.0–10.5)

## 2017-08-14 NOTE — Care Management Note (Signed)
Case Management Note  Patient Details  Name: Josefine ClassJanice Nofsinger MRN: [id number] Date of Birth: 1955-10-27  Subjective/Objective:          Pt presents for revision of Left BKA.  Pt lives at home with sister, who is present 24/7 to care for pt.  Pt has shower chair and wheelchair at home.  SNF was recommended but pt refused.  Pt would like to return home with home health.          [facility] [facility] [place] [facility] [place] to choose [facility] health agency.  List left with patient as she would like to discuss with family.  Expected Discharge Date:                  Expected Discharge Plan:  Home w Home Health Services  In-House Referral:  Clinical Social Work  Discharge planning Services  CM Consult  Post Acute Care Choice:  Home Health Choice offered to:  Patient  DME Arranged:  N/A DME Agency:  NA  HH Arranged:  NA HH Agency:  NA  Status of Service:  In process, will continue to follow  If discussed at Long Length of Stay Meetings, dates discussed:    Additional Comments:  Verdene LennertGoldean, Whitney Hillegass K, RN 08/14/2017, 4:50 PM

## 2017-08-14 NOTE — Progress Notes (Signed)
Inpatient Diabetes Program Recommendations  AACE/ADA: New Consensus Statement on Inpatient Glycemic Control (2015)  Target Ranges:  Prepandial:   less than 140 mg/dL      Peak postprandial:   less than 180 mg/dL (1-2 hours)      Critically ill patients:  140 - 180 mg/dL   Lab Results  Component Value Date   GLUCAP 329 (H) 08/14/2017   HGBA1C 9.3 (H) 08/12/2017    Review of Glycemic Control Results for Danielle Buchanan, Danielle Buchanan (MRN [id number]) as of 08/14/2017 11:28  Ref. Range 08/13/2017 06:20 08/13/2017 11:40 08/13/2017 16:38 08/13/2017 20:52 08/14/2017 06:42  Glucose-Capillary Latest Ref Range: 65 - 99 mg/dL 102329 (H) 725294 (H) 366268 (H) 335 (H) 329 (H)   Diabetes history: DM2 Outpatient Diabetes medications: Novolin 70/30 insulin mix 90 units bid Current orders for Inpatient glycemic control: Novolog correction sensitive tid  Inpatient Diabetes Program Recommendations:   -Add Novolog 70/30 45 units bid -Add Novolog 0-5 units hs correction  Thank you, Darel HongJudy E. Tejah Brekke, RN, MSN, CDE  Diabetes Coordinator Inpatient Glycemic Control Team Team Pager [phone] (8am-5pm) 08/14/2017 11:29 AM

## 2017-08-14 NOTE — Clinical Social Work Note (Signed)
Clinical Social Work Assessment  Patient Details  Name: Danielle Buchanan MRN: [id number] Date of Birth: 1956-01-17  Date of referral:  08/14/17               Reason for consult:  Discharge Planning                Permission sought to share information with:  Facility Art therapist granted to share information::  Yes, Release of Information Signed  Name::     Mora Bellman  Agency::     Relationship::  nephew  Contact Information:  [phone]  Housing/Transportation Living arrangements for the past 2 months:  Single Family Home Source of Information:  Patient, Power of Attorney Patient Interpreter Needed:  None Criminal Activity/Legal Involvement Pertinent to Current Situation/Hospitalization:  No - Comment as needed Significant Relationships:  Siblings, Other Family Members Lives with:  Siblings Do you feel safe going back to the place where you live?  Yes Need for family participation in patient care:  Yes (Comment)  Care giving concerns:  No family at bedside. Patient lives with sister and sister is supportive. Patients POA [name] (patients nephew) checks in on patient everyday for needs  Social Worker assessment / plan:  CSW met patient at bedside to discuss disposition plan. Patient refused SNF stating she does not want to go back to facility and would like to discharge back home.   CSW reached out to patients nephew [name] who is patients POA. [name] stated he would like patient to go back to SNF but understands that patient does not want to go back to SNF. Greg requested home health to follow patient at discharge and stated patients sister will be at the home for support. [name] stated he will be able to look after patient and his wife will also  Be able to check in on patient. CSW made RNCM aware of family's decisions. CSW signing off as patient no longer has social work needs.  Employment status:  Retired Nurse, adult PT  Recommendations:  [place] / Referral to community resources:  [place]  Patient/Family's Response to care:  Family supportive of patient and patients needs  Patient/Family's Understanding of and Emotional Response to Diagnosis, Current Treatment, and Prognosis:  Patient not willing to go back to SNF. Family supportive but is aware patient will need supervision in the home   Emotional Assessment Appearance:  Appears stated age Attitude/Demeanor/Rapport:  Other Affect (typically observed):  Appropriate Orientation:  Oriented to Self, Oriented to Situation, Oriented to Place, Oriented to  Time Alcohol / Substance use:  Not Applicable Psych involvement (Current and /or in the community):  No (Comment)  Discharge Needs  Concerns to be addressed:  Discharge Planning Concerns Readmission within the last 30 days:  Yes Current discharge risk:  Physical Impairment Barriers to Discharge:  No Barriers Identified   Wende Neighbors, LCSW 08/14/2017, 3:36 PM

## 2017-08-14 NOTE — Progress Notes (Signed)
Physical Therapy Treatment Patient Details Name: Josefine ClassJanice Richart MRN: [id number] DOB: [birth year] Today's Date: 08/14/2017    History of Present Illness Pt is a 61 y.o. female admitted 08/12/17 for revision of recent left BKA secondary to fall on amputation site; original BKA was 07/23/17. Pertinent PMH includes HTN, DM, HLD, OSA, depression.     PT Comments    Pt LLE propped and flexed in bed upon arrival. Educated pt on proper positioning for LLE when resting in bed or recliner.  Pt hesitant for getting out of bed today due to fear of falling. Encouraged pt to stand using stedy lift. Standing trials (listed below). Pt toiled using stedy lift in restroom. Difficult for pt to push up from toilet Max A+2 for sit to stand transfer from toilet. Recommending use of 3-in-1 in restroom to help with transfers. Continue to educate pt on positioning and getting OOB. Pt will benefit from continued PT to increase pt independence and safety with mobility.   Follow Up Recommendations  CIR;Supervision for mobility/OOB     Equipment Recommendations  Other (comment)    Recommendations for Other Services Rehab consult     Precautions / Restrictions Precautions Precautions: Fall Precaution Comments: L BKA Restrictions Weight Bearing Restrictions: Yes LLE Weight Bearing: Non weight bearing    Mobility  Bed Mobility Overal bed mobility: Needs Assistance Bed Mobility: Supine to Sit     Supine to sit: Mod assist;HOB elevated;+2 for physical assistance(sitting pivot, Used chuck pad to help pt get EOB)     General bed mobility comments: Reliant on modA+2 to assist trunk into elevation, pt reliant on UE support once sitting EOB, pt required min-modA for sitting balance without UE support. Pt hesitant due to fear of falling  Transfers Overall transfer level: Needs assistance     Sit to Stand: Mod assist;+2 safety/equipment;+2 physical assistance(4 standing trials; 23secs, 38secs, 30secs, 20 secs; VC  for erect posture (lateral lean to R) )         General transfer comment: Mod A+2 using stedy for sit to stand from EOB. Pt to restroom with stedy lift instead of BSC. Pt fatigued max assist +2 for sit to stand transfer from toilet with stedy lift. VC for power up.  Ambulation/Gait                 Stairs            Wheelchair Mobility    Modified Rankin (Stroke Patients Only)       Balance Overall balance assessment: Needs assistance Sitting-balance support: Feet supported;Bilateral upper extremity supported(RLE supported) Sitting balance-Leahy Scale: Poor Sitting balance - Comments: Pt relies on UE support. Min-mod A for seated balance without UE support Postural control: Right lateral lean Standing balance support: Bilateral upper extremity supported Standing balance-Leahy Scale: Poor Standing balance comment: used stedy lift                            Cognition Arousal/Alertness: Awake/alert Behavior During Therapy: WFL for tasks assessed/performed Overall Cognitive Status: Within Functional Limits for tasks assessed                                        Exercises      General Comments        Pertinent Vitals/Pain Pain Assessment: 0-10 Pain Score: 4  Pain Location: L LE Pain  Descriptors / Indicators: Guarding;Sore Pain Intervention(s): Monitored during session;Repositioned    Home Living                      Prior Function            PT Goals (current goals can now be found in the care plan section) Acute Rehab PT Goals Patient Stated Goal: not discussed Progress towards PT goals: Not progressing toward goals - comment(Pt fear of falling. Stood with [employer]edy lift today)    Frequency    Min 3X/week      PT Plan Current plan remains appropriate    Co-evaluation              AM-PAC PT "6 Clicks" Daily Activity  Outcome Measure  Difficulty turning over in bed (including adjusting  bedclothes, sheets and blankets)?: Unable Difficulty moving from lying on back to sitting on the side of the bed? : Unable Difficulty sitting down on and standing up from a chair with arms (e.g., wheelchair, bedside commode, etc,.)?: Unable Help needed moving to and from a bed to chair (including a wheelchair)?: A Lot Help needed walking in hospital room?: A Lot Help needed climbing 3-5 steps with a railing? : Total 6 Click Score: 8    End of Session Equipment Utilized During Treatment: Gait belt Activity Tolerance: Patient tolerated treatment well;Patient limited by fatigue Patient left: in chair;with call bell/phone within reach;with chair alarm set Nurse Communication: Mobility status PT Visit Diagnosis: Difficulty in walking, not elsewhere classified (R26.2);Pain Pain - Right/Left: Left Pain - part of body: Leg     Time: 0928-1000 PT Time Calculation (min) (ACUTE ONLY): 32 min  Charges:  $Therapeutic Activity: 23-37 mins                    G Codes:  Functional Assessment Tool Used: AM-PAC 6 Clicks Basic Mobility    Forde RadonSybil Blessed Cotham, SPTA    Forde RadonSybil Shantia Sanford 08/14/2017, 12:09 PM

## 2017-08-14 NOTE — Progress Notes (Signed)
Orthopedic Tech Progress Note Patient Details:  Danielle Buchanan Nov 26, 1955 [id number]  Patient ID: Danielle Buchanan, female   DOB: Nov 26, 1955, 61 y.o.   MRN: [id number]   [name], [name] 08/14/2017, 9:51 AM Called in bio-tech brace order; [facility]

## 2017-08-14 NOTE — Progress Notes (Addendum)
[facility] [place]  Subjective  - Sweating a lot over night.  Pain issues post surgery.   Objective (!) 144/79 94 98.6 F (37 C) (Oral) 18 92%  Intake/Output Summary (Last 24 hours) at 08/14/2017 0734 Last data filed at 08/14/2017 0316 Gross per 24 hour  Intake 720 ml  Output -  Net 720 ml    Left revised BKA viable, no erythema or skin discoloration. New dressing applied. Heart RRR Lungs non labored breathing  Assessment/Planning: POD # 2 revised left BKA  Afebrile WBC decreasing now 12.4 from 13.7 Plan is to discharge patient home when stable and pain is controlled. Will order biotech sock   Danielle Buchanan, Danielle Buchanan 08/14/2017 7:34 AM --  Laboratory Lab Results: Recent Labs    08/13/17 0256 08/14/17 0222  WBC 13.7* 12.4*  HGB 10.7* 10.3*  HCT 33.2* 31.7*  PLT 273 186   BMET Recent Labs    08/13/17 0256 08/14/17 0222  NA 131* 129*  K 5.2* 3.7  CL 97* 94*  CO2 23 28  GLUCOSE 327* 309*  BUN 10 7  CREATININE 0.88 0.82  CALCIUM 8.5* 8.3*    COAG Lab Results  Component Value Date   INR 0.92 06/09/2017   No results found for: PTT   I have independently interviewed and examined patient and agree with PA assessment and plan above.   Brandon C. Randie Heinzain, MD [facility] [place] Office: [phone] Pager: [phone]

## 2017-08-15 LAB — CBC
HCT: 30.3 % — ABNORMAL LOW (ref 36.0–46.0)
HEMOGLOBIN: 9.7 g/dL — AB (ref 12.0–15.0)
MCH: 27.2 pg (ref 26.0–34.0)
MCHC: 32 g/dL (ref 30.0–36.0)
MCV: 85.1 fL (ref 78.0–100.0)
PLATELETS: 178 10*3/uL (ref 150–400)
RBC: 3.56 MIL/uL — AB (ref 3.87–5.11)
RDW: 14 % (ref 11.5–15.5)
WBC: 9 10*3/uL (ref 4.0–10.5)

## 2017-08-15 LAB — BASIC METABOLIC PANEL
Anion gap: 8 (ref 5–15)
BUN: 9 mg/dL (ref 6–20)
CHLORIDE: 96 mmol/L — AB (ref 101–111)
CO2: 27 mmol/L (ref 22–32)
Calcium: 8.6 mg/dL — ABNORMAL LOW (ref 8.9–10.3)
Creatinine, Ser: 0.79 mg/dL (ref 0.44–1.00)
Glucose, Bld: 300 mg/dL — ABNORMAL HIGH (ref 65–99)
POTASSIUM: 3.7 mmol/L (ref 3.5–5.1)
SODIUM: 131 mmol/L — AB (ref 135–145)

## 2017-08-15 LAB — GLUCOSE, CAPILLARY
GLUCOSE-CAPILLARY: 325 mg/dL — AB (ref 65–99)
GLUCOSE-CAPILLARY: 316 mg/dL — AB (ref 65–99)
GLUCOSE-CAPILLARY: 310 mg/dL — AB (ref 65–99)
Glucose-Capillary: 326 mg/dL — ABNORMAL HIGH (ref 65–99)

## 2017-08-15 MED ORDER — OXYCODONE-ACETAMINOPHEN 5-325 MG PO TABS: 1.0000 | ORAL_TABLET | ORAL | 0 refills | Status: DC | PRN

## 2017-08-15 NOTE — Plan of Care (Signed)
Rested well throughout shift with adequate pain relief with current pain regimen.

## 2017-08-15 NOTE — Discharge Summary (Signed)
[facility] Discharge Summary   Patient ID:  Danielle Buchanan Harrell MRN: [id number] DOB/AGE: Jun 15, 1956 61 y.o.  Admit date: 08/12/2017 Discharge date: 08/15/2017 Date of Surgery: 08/12/2017 Surgeon: Surgeon(s): Maeola Harmanain, Brandon Christopher, MD  Admission Diagnosis: nonviable tissue  Discharge Diagnoses:  nonviable tissue  Secondary Diagnoses: Past Medical History:  Diagnosis Date  . Allergic rhinitis   . Depression   . Diabetes (HCC)   . Dyslipidemia   . HTN (hypertension)   . Insomnia   . OSA (obstructive sleep apnea)    no cpap  . Urinary incontinence     Procedure(s): REVISION BELOW KNEE  Discharged Condition: stable  HPI: Danielle Buchanan Doolan is a 61 y.o. female follows up from recent left below-knee amputation.  Unfortunately in the hospital she had a fall and now follows up with wound as demonstrated in Suzanne's note yesterday.  She is not having any fevers or chills or other constitutional symptoms.  Unfortunately on discharge she went to a nursing facility but discharged herself and had to purchase a wheelchair upon getting home does not have other supplies for home.  They are now interested in getting that and will need home health physical therapy and nursing upon next discharge from the hospital.  Wound break down left BKA stump.   Hospital Course:  Danielle Buchanan Slagel is a 61 y.o. female is S/P  Procedure(s): REVISION BELOW KNEE Assessment:  61 y.o. female is s/p left bka  POD # 1 revision left BKA  Plan to change dressing tomorrow WBC 13.7  Left revised BKA viable, no erythema or skin discoloration. New dressing applied. Heart RRR Lungs non labored breathing  Assessment/Planning: POD # 2 revised left BKA  Afebrile WBC decreasing now 12.4 from 13.7 Plan is to discharge patient home when stable and pain is controlled. Will order biotech sock    Home today with HH.  F/U 4 weeks for incision check and staple removal.    Significant Diagnostic  Studies: CBC Lab Results  Component Value Date   WBC 9.0 08/15/2017   HGB 9.7 (L) 08/15/2017   HCT 30.3 (L) 08/15/2017   MCV 85.1 08/15/2017   PLT 178 08/15/2017    BMET    Component Value Date/Time   NA 131 (L) 08/15/2017 0404   K 3.7 08/15/2017 0404   CL 96 (L) 08/15/2017 0404   CO2 27 08/15/2017 0404   GLUCOSE 300 (H) 08/15/2017 0404   BUN 9 08/15/2017 0404   CREATININE 0.79 08/15/2017 0404   CALCIUM 8.6 (L) 08/15/2017 0404   GFRNONAA >60 08/15/2017 0404   GFRAA >60 08/15/2017 0404   COAG Lab Results  Component Value Date   INR 0.92 06/09/2017     Disposition:  Discharge to :Home Discharge Instructions    Call MD for:  redness, tenderness, or signs of infection (pain, swelling, bleeding, redness, odor or green/yellow discharge around incision site)   Complete by:  As directed    Call MD for:  severe or increased pain, loss or decreased feeling  in affected limb(s)   Complete by:  As directed    Call MD for:  temperature >100.5   Complete by:  As directed    Discharge instructions   Complete by:  As directed    You may shower daily as needed.   Increase activity slowly   Complete by:  As directed    Walk with assistance use walker or cane as needed   Resume previous diet   Complete by:  As directed  Allergies as of 08/15/2017      Reactions   Doxycycline Rash      Medication List    TAKE these medications   amitriptyline 25 MG tablet Commonly known as:  ELAVIL Take 25 mg by mouth at bedtime.   cephALEXin 500 MG capsule Commonly known as:  KEFLEX Take 1 capsule (500 mg total) by mouth 3 (three) times daily.   collagenase ointment Commonly known as:  SANTYL Apply 1 application topically daily.   [employer] NICOTINE TRANSDERMAL SYS 14 mg/24hr patch Generic drug:  nicotine Apply 1 patch daily for smoking cessation   insulin NPH-regular Human (70-30) 100 UNIT/ML injection Commonly known as:  NOVOLIN 70/30 120 units with breakfast, and 90 units  with the evening meal What changed:    how much to take  how to take this  when to take this  additional instructions   losartan 100 MG tablet Commonly known as:  COZAAR Take 100 mg by mouth daily.   Melatonin 5 MG Tabs Take 5 mg by mouth at bedtime.   oxyCODONE-acetaminophen 5-325 MG tablet Commonly known as:  PERCOCET/ROXICET Take 1-2 tablets by mouth every 6 (six) hours as needed for severe pain. What changed:  Another medication with the same name was added. Make sure you understand how and when to take each.   oxyCODONE-acetaminophen 5-325 MG tablet Commonly known as:  PERCOCET/ROXICET Take 1-2 tablets every 4 (four) hours as needed by mouth for moderate pain. What changed:  You were already taking a medication with the same name, and this prescription was added. Make sure you understand how and when to take each.   pravastatin 20 MG tablet Commonly known as:  PRAVACHOL Take 20 mg by mouth daily.   sertraline 100 MG tablet Commonly known as:  ZOLOFT Take 150 mg by mouth daily.   traZODone 50 MG tablet Commonly known as:  DESYREL Take 50 mg by mouth at bedtime.   VITAMIN E PO Take 180 mg by mouth daily.   Zinc 50 MG Tabs Take 50 mg by mouth daily.      Verbal and written Discharge instructions given to the patient. Wound care per Discharge AVS Follow-up Information    Maeola Harmanain, Brandon Christopher, MD. Go on 09/06/2017.   Specialties:  Vascular Surgery, Cardiology Why:  11/30 at 10:30 am Contact information: [address] [place] [address] [phone]        [facility], [facility]-Home Follow up.   Specialty:  Home Health Services Why:  HHPT arranged- they will call you to set up home visits Contact information: [address] [place] [address] [phone]           Signed: Clinton GallantCOLLINS, Yasemin Rabon [facility]MA[facility]EN 08/15/2017, 1:03 PM

## 2017-08-15 NOTE — Progress Notes (Signed)
Inpatient Diabetes Program Recommendations  AACE/ADA: New Consensus Statement on Inpatient Glycemic Control (2015)  Target Ranges:  Prepandial:   less than 140 mg/dL      Peak postprandial:   less than 180 mg/dL (1-2 hours)      Critically ill patients:  140 - 180 mg/dL   Results for Josefine ClassWEINKE, Tina (MRN [id number]) as of 08/15/2017 11:21  Ref. Range 08/14/2017 06:42 08/14/2017 11:20 08/14/2017 18:05 08/14/2017 21:30 08/15/2017 06:44 08/15/2017 09:51  Glucose-Capillary Latest Ref Range: 65 - 99 mg/dL 409329 (H) 811325 (H) 914388 (H) 316 (H) 310 (H) 326 (H)    Review of Glycemic Control  Diabetes history: DM2 Outpatient Diabetes medications: 70/30 90 units BID (per home med list with last dose of 30 units taken prior to hospital admission) Current orders for Inpatient glycemic control: Novolog 0-9 units TID with meals  Inpatient Diabetes Program Recommendations:  Insulin - Basal: Blood glucose has ranged from 316-388 mg/dl over past 24 hours. Patient needs basal and meal coverage insulin ordered; recommend using 70/30 but at lower dose than patient reports taking as an outpatient.   Please consider ordering 70/30 28 units BID (based on 98 kg x 0.4 units; this dose of 70/30 will provide a total of 40 units for basal and 16 units for meal coverage per day).  Thanks, Orlando PennerMarie Jakeria Caissie, RN, MSN, CDE Diabetes Coordinator Inpatient Diabetes Program [phone] (Team Pager from 8am to 5pm)

## 2017-08-15 NOTE — Progress Notes (Signed)
Pt given discharge instructions and Rx, all questions answered.  Pt transported by wheelchair to personal vehicle and then assisted into vehicle.

## 2017-08-15 NOTE — Progress Notes (Signed)
  Progress Note    08/15/2017 8:16 AM 3 Days Post-Op  Subjective:  Stable pain in left bka  Vitals:   08/15/17 0600 08/15/17 0700  BP:    Pulse: 80 92  Resp: 17 19  Temp:    SpO2: 94% 94%    Physical Exam: aaox3 Limited mobility of knee     CBC    Component Value Date/Time   WBC 9.0 08/15/2017 0404   RBC 3.56 (L) 08/15/2017 0404   HGB 9.7 (L) 08/15/2017 0404   HCT 30.3 (L) 08/15/2017 0404   PLT 178 08/15/2017 0404   MCV 85.1 08/15/2017 0404   MCH 27.2 08/15/2017 0404   MCHC 32.0 08/15/2017 0404   RDW 14.0 08/15/2017 0404   LYMPHSABS 1.9 07/12/2017 1605   MONOABS 0.5 07/12/2017 1605   EOSABS 0.4 07/12/2017 1605   BASOSABS 0.1 07/12/2017 1605    BMET    Component Value Date/Time   NA 131 (L) 08/15/2017 0404   K 3.7 08/15/2017 0404   CL 96 (L) 08/15/2017 0404   CO2 27 08/15/2017 0404   GLUCOSE 300 (H) 08/15/2017 0404   BUN 9 08/15/2017 0404   CREATININE 0.79 08/15/2017 0404   CALCIUM 8.6 (L) 08/15/2017 0404   GFRNONAA >60 08/15/2017 0404   GFRAA >60 08/15/2017 0404    INR    Component Value Date/Time   INR 0.92 06/09/2017 2105     Intake/Output Summary (Last 24 hours) at 08/15/2017 0816 Last data filed at 08/14/2017 1827 Gross per 24 hour  Intake 0 ml  Output -  Net 0 ml     Assessment:  61 y.o. female is s/p revision of left bka  Plan: Needs home health Can d/c today when pain controlled   Veanna Dower C. Randie Heinzain, MD [facility] [place] Office: [phone] Pager: [phone]  08/15/2017 8:16 AM

## 2017-08-15 NOTE — Consult Note (Signed)
   [facility]   08/15/2017  Josefine ClassJanice Seith 10-23-1955 [id number]  Patient assessed for recent re-admission.  Decinng skilled nursing care.  Came by to speak with the patient and family regarding [facility] Care Management services after speaking with [facility] regarding patient refusing skilled facilty.  Patient is not in room and appears to have transtioned to home.  Patient to have [facility].  Will follow up.  For questions, please contact:  Charlesetta ShanksVictoria Taetum Flewellen, RN BSN CCM [facility] Liaison  [phone] business mobile phone Toll free office [phone]

## 2017-08-15 NOTE — Progress Notes (Signed)
Physical Therapy Treatment Patient Details Name: Danielle Buchanan MRN: [id number] DOB: 1956-07-20 Today's Date: 08/15/2017    History of Present Illness Pt is a 61 y.o. female admitted 08/12/17 for revision of recent left BKA secondary to fall on amputation site; original BKA was 07/23/17. Pertinent PMH includes HTN, DM, HLD, OSA, depression.     PT Comments    Pt with improved mobility today but continues to need assistance with transfers to chair.     Follow Up Recommendations  CIR;Supervision for mobility/OOB(pt refusing; wants HHPT)     Equipment Recommendations  Rolling walker with 5" wheels(slideboard)    Recommendations for Other Services Rehab consult     Precautions / Restrictions Precautions Precautions: Fall Precaution Comments: L BKA Restrictions Weight Bearing Restrictions: Yes LLE Weight Bearing: Non weight bearing    Mobility  Bed Mobility Overal bed mobility: Needs Assistance Bed Mobility: Supine to Sit     Supine to sit: Min assist     General bed mobility comments: Pt sitting up in recliner   Transfers Overall transfer level: Needs assistance Equipment used: 1 person hand held assist Transfers: Squat Pivot Transfers Sit to Stand: Mod assist   Squat pivot transfers: Min assist;+2 safety/equipment     General transfer comment: Utilized bedrail as a grab bar and assist from therapist - attempted to simulate home environment   Ambulation/Gait                 Stairs            Wheelchair Mobility    Modified Rankin (Stroke Patients Only)       Balance Overall balance assessment: Needs assistance Sitting-balance support: Feet supported Sitting balance-Leahy Scale: Fair     Standing balance support: Single extremity supported;During functional activity;Bilateral upper extremity supported Standing balance-Leahy Scale: Poor Standing balance comment: requires min - mod A and UE support                              Cognition Arousal/Alertness: Awake/alert Behavior During Therapy: WFL for tasks assessed/performed Overall Cognitive Status: Within Functional Limits for tasks assessed                                        Exercises Amputee Exercises Quad Sets: AROM;Left;5 reps;Seated    General Comments General comments (skin integrity, edema, etc.): Pt states she feels confident in her sister's ability to assist her at home       Pertinent Vitals/Pain Pain Assessment: Faces Pain Score: 8  Faces Pain Scale: Hurts whole lot Pain Location: L LE Pain Descriptors / Indicators: Operative site guarding;Sharp Pain Intervention(s): Monitored during session;Repositioned;Patient requesting pain meds-RN notified    Home Living                      Prior Function            PT Goals (current goals can now be found in the care plan section) Acute Rehab PT Goals PT Goal Formulation: With patient Time For Goal Achievement: 08/27/17 Potential to Achieve Goals: Fair Progress towards PT goals: Progressing toward goals    Frequency    Min 3X/week      PT Plan Discharge plan needs to be updated    Co-evaluation              AM-PAC  PT "6 Clicks" Daily Activity  Outcome Measure                   End of Session Equipment Utilized During Treatment: Gait belt Activity Tolerance: Patient tolerated treatment well Patient left: in chair;with call bell/phone within reach;with chair alarm set Nurse Communication: Mobility status PT Visit Diagnosis: Difficulty in walking, not elsewhere classified (R26.2);Pain Pain - Right/Left: Left Pain - part of body: Leg     Time: 4098-11911027-1045 PT Time Calculation (min) (ACUTE ONLY): 18 min  Charges:  $Therapeutic Activity: 8-22 mins                    G Codes:       Clarita CraneStephanie F Clanton Emanuelson, PT, DPT [phone]    Moshe CiproMatthews, Brandonlee Navis K 08/15/2017, 1:20 PM

## 2017-08-15 NOTE — Progress Notes (Signed)
Occupational Therapy Treatment Patient Details Name: Danielle Buchanan ClassJanice Charpentier MRN: [id number] DOB: April 01, 1956 Today's Date: 08/15/2017    History of present illness Pt is a 61 y.o. female admitted 08/12/17 for revision of recent left BKA secondary to fall on amputation site; original BKA was 07/23/17. Pertinent PMH includes HTN, DM, HLD, OSA, depression.    OT comments  Pt now with plan to discharge home.  She requires max A for LB ADLs and mod A for transfers.  She is at high risk for falls, but states [facility], and she feels confident with sister's ability to assist her at home.  Recommend HHOT.   Follow Up Recommendations  Home health OT;Supervision/Assistance - 24 hour    Equipment Recommendations  None recommended by OT    Recommendations for Other Services      Precautions / Restrictions Precautions Precautions: Fall Precaution Comments: L BKA Restrictions Weight Bearing Restrictions: Yes LLE Weight Bearing: Non weight bearing       Mobility Bed Mobility               General bed mobility comments: Pt sitting up in recliner   Transfers Overall transfer level: Needs assistance Equipment used: 1 person hand held assist   Sit to Stand: Mod assist         General transfer comment: Utilized bedrail as a grab bar and assist from therapist - attempted to simulate home environment     Balance Overall balance assessment: Needs assistance Sitting-balance support: Feet supported Sitting balance-Leahy Scale: Fair     Standing balance support: Single extremity supported;During functional activity;Bilateral upper extremity supported Standing balance-Leahy Scale: Poor Standing balance comment: requires min - mod A and UE support                            ADL either performed or assessed with clinical judgement   ADL Overall ADL's : Needs assistance/impaired                 Upper Body Dressing : Set  up;Sitting   Lower Body Dressing: Sit to/from stand;Maximal assistance Lower Body Dressing Details (indicate cue type and reason): simulated LB dressing like she performs at home.  Using bedrail as a grab bar, pt was able to stand with mod A, max A to pull pants over hips.  Requires mod A to thread LEs through pantlegs  Toilet Transfer: Moderate assistance;Stand-pivot;BSC   Toileting- Clothing Manipulation and Hygiene: Maximal assistance;Sit to/from stand               Vision       Perception     Praxis      Cognition Arousal/Alertness: Awake/alert Behavior During Therapy: WFL for tasks assessed/performed Overall Cognitive Status: Within Functional Limits for tasks assessed                                          Exercises     Shoulder Instructions       General Comments Pt states she feels confident in her sister's ability to assist her at home     Pertinent Vitals/ Pain       Pain Assessment: Faces Faces Pain Scale: Hurts whole lot Pain Location: L LE Pain Descriptors / Indicators: Operative site guarding;Sharp Pain Intervention(s): Monitored during session;Repositioned;Patient requesting pain meds-RN notified  Home Living                                          Prior Functioning/Environment              Frequency  Min 3X/week        Progress Toward Goals  OT Goals(current goals can now be found in the care plan section)  Progress towards OT goals: Progressing toward goals     Plan Discharge plan needs to be updated    Co-evaluation                 AM-PAC PT "6 Clicks" Daily Activity     Outcome Measure   Help from another person eating meals?: A Little Help from another person taking care of personal grooming?: A Little Help from another person toileting, which includes using toliet, bedpan, or urinal?: A Lot Help from another person bathing (including washing, rinsing, drying)?: A Little Help  from another person to put on and taking off regular upper body clothing?: A Little Help from another person to put on and taking off regular lower body clothing?: A Lot 6 Click Score: 16    End of Session Equipment Utilized During Treatment: Gait belt  OT Visit Diagnosis: Pain Pain - Right/Left: Left Pain - part of body: Leg   Activity Tolerance Patient tolerated treatment well   Patient Left in chair;with call bell/phone within reach;with chair alarm set;with family/visitor present   Nurse Communication Patient requests pain meds        Time: [id number] OT Time Calculation (min): 28 min  Charges: OT General Charges $OT Visit: 1 Visit OT Treatments $Self Care/Home Management : 23-37 mins  Reynolds AmericanWendi Carston Riedl, OTR/L [phone]    Jeani HawkingConarpe, Dalasia Predmore M 08/15/2017, 12:06 PM

## 2017-08-15 NOTE — Care Management Note (Signed)
Case Management Note Donn PieriniKristi Dock Baccam RN, BSN Unit 4E-Case Manager [phone]  Patient Details  Name: Danielle ClassJanice Curro MRN: [id number] Date of Birth: 1956/01/11  Subjective/Objective:  Pt admitted s/p revision of right BKA (hx falls at home)                   Action/Plan: PTA pt lived at home- has sister that assist minimally- on last admit had recommendation for CIR- however insurance did not approve for admission and [facility]- left SNF and went home.- CSW to f/u with pt for possible return [facility] SNF [facility]- CM and CSW to follow.    Expected Discharge Date:  08/15/17               Expected Discharge Plan:  Home w Home Health Services  In-House Referral:  Clinical Social Work  Discharge planning Services  CM Consult  Post Acute Care Choice:  Home Health Choice offered to:  Patient  DME Arranged:  N/A DME Agency:  NA  HH Arranged:  Refused SNF, PT HH Agency:  [facility]  Status of Service:  Completed, signed off  If discussed at Long Length of Stay Meetings, dates discussed:    Discharge Disposition: home/home health   Additional Comments:  08/15/17- 1100- Bethany Cumming RN CM-  Pt for d/c home today- order placed for HHPT- f[facility]u done with pt [facility]- has pt has refused SNF- per pt she has selected [facility] for agency of choice- discussed DME needs- pt has w/c, RW, and shower chair at home- may benefit from slide board to discuss with HHPT - [facility]  Zenda AlpersWebster, Lenn SinkKristi Hall, RN 08/15/2017, 11:04 AM

## 2017-08-16 ENCOUNTER — Other Ambulatory Visit: Payer: Self-pay

## 2017-08-16 DIAGNOSIS — Z89512 Acquired absence of left leg below knee: Secondary | ICD-10-CM | POA: Diagnosis not present

## 2017-08-16 DIAGNOSIS — E119 Type 2 diabetes mellitus without complications: Secondary | ICD-10-CM | POA: Diagnosis not present

## 2017-08-16 DIAGNOSIS — I10 Essential (primary) hypertension: Secondary | ICD-10-CM | POA: Diagnosis not present

## 2017-08-16 DIAGNOSIS — Z794 Long term (current) use of insulin: Secondary | ICD-10-CM | POA: Diagnosis not present

## 2017-08-16 DIAGNOSIS — Z4781 Encounter for orthopedic aftercare following surgical amputation: Secondary | ICD-10-CM | POA: Diagnosis not present

## 2017-08-16 NOTE — Consult Note (Signed)
Follow up:  0952: Called placed to patient and patient would only verify her name.  Tried to explain [facility] Care [facility] but patient politely states, "we have someone that's suppose to come out.  So, I don't need to people coming out. Attempted to explain what [facility] Care Management was when patient said politely, "I don't think we need two, I am going to hang up now."   11:24 - Chart review reveals the patient had her home health care arranged with [facility], spoke with Lupita LeashDonna from [facility] for Liaison's number.   12:15 Called and left a voice mail with Henderson NewcomerMelissa Stenson, Liaison Community, with [facility] for a return call.    Charlesetta ShanksVictoria Charnee Turnipseed, RN BSN CCM [facility] Liaison  [phone] business mobile phone Toll free office [phone]

## 2017-08-20 DIAGNOSIS — Z794 Long term (current) use of insulin: Secondary | ICD-10-CM | POA: Diagnosis not present

## 2017-08-20 DIAGNOSIS — Z89512 Acquired absence of left leg below knee: Secondary | ICD-10-CM | POA: Diagnosis not present

## 2017-08-20 DIAGNOSIS — I10 Essential (primary) hypertension: Secondary | ICD-10-CM | POA: Diagnosis not present

## 2017-08-20 DIAGNOSIS — Z4781 Encounter for orthopedic aftercare following surgical amputation: Secondary | ICD-10-CM | POA: Diagnosis not present

## 2017-08-20 DIAGNOSIS — E119 Type 2 diabetes mellitus without complications: Secondary | ICD-10-CM | POA: Diagnosis not present

## 2017-08-21 ENCOUNTER — Encounter (HOSPITAL_BASED_OUTPATIENT_CLINIC_OR_DEPARTMENT_OTHER): Payer: Medicare HMO | Attending: Surgery

## 2017-08-21 DIAGNOSIS — E1140 Type 2 diabetes mellitus with diabetic neuropathy, unspecified: Secondary | ICD-10-CM | POA: Diagnosis not present

## 2017-08-21 DIAGNOSIS — L97512 Non-pressure chronic ulcer of other part of right foot with fat layer exposed: Secondary | ICD-10-CM | POA: Diagnosis not present

## 2017-08-21 DIAGNOSIS — E1152 Type 2 diabetes mellitus with diabetic peripheral angiopathy with gangrene: Secondary | ICD-10-CM | POA: Diagnosis not present

## 2017-08-21 DIAGNOSIS — I70235 Atherosclerosis of native arteries of right leg with ulceration of other part of foot: Secondary | ICD-10-CM | POA: Diagnosis not present

## 2017-08-21 DIAGNOSIS — E11628 Type 2 diabetes mellitus with other skin complications: Secondary | ICD-10-CM | POA: Diagnosis not present

## 2017-08-21 DIAGNOSIS — I10 Essential (primary) hypertension: Secondary | ICD-10-CM | POA: Diagnosis not present

## 2017-08-21 DIAGNOSIS — Z79899 Other long term (current) drug therapy: Secondary | ICD-10-CM | POA: Diagnosis not present

## 2017-08-21 DIAGNOSIS — E11621 Type 2 diabetes mellitus with foot ulcer: Secondary | ICD-10-CM | POA: Diagnosis not present

## 2017-08-21 DIAGNOSIS — Z89512 Acquired absence of left leg below knee: Secondary | ICD-10-CM | POA: Diagnosis not present

## 2017-08-21 DIAGNOSIS — Z87891 Personal history of nicotine dependence: Secondary | ICD-10-CM | POA: Diagnosis not present

## 2017-08-21 DIAGNOSIS — Z794 Long term (current) use of insulin: Secondary | ICD-10-CM | POA: Diagnosis not present

## 2017-08-22 ENCOUNTER — Telehealth: Payer: Self-pay | Admitting: *Deleted

## 2017-08-22 DIAGNOSIS — I10 Essential (primary) hypertension: Secondary | ICD-10-CM | POA: Diagnosis not present

## 2017-08-22 DIAGNOSIS — E119 Type 2 diabetes mellitus without complications: Secondary | ICD-10-CM | POA: Diagnosis not present

## 2017-08-22 DIAGNOSIS — Z4781 Encounter for orthopedic aftercare following surgical amputation: Secondary | ICD-10-CM | POA: Diagnosis not present

## 2017-08-22 DIAGNOSIS — Z794 Long term (current) use of insulin: Secondary | ICD-10-CM | POA: Diagnosis not present

## 2017-08-22 DIAGNOSIS — Z89512 Acquired absence of left leg below knee: Secondary | ICD-10-CM | POA: Diagnosis not present

## 2017-08-22 NOTE — Telephone Encounter (Signed)
[facility] nurse called requesting appt.  for patient due to" large amount of drainage, odor and significant redness on stump" Very tender to touch, staples intact, however; patient is  Afebrile and completed antibiotics 08/21/17. Patient was at the wound clinic yesterday but they did not look at the stump treated her foot wound only. Called patient's sister [name] and gave her appointment for 08/23/17 at 10 am with Dr. Randie Heinzain. This patient had a one time visit with [facility]  for wound check and will need orders for any further visits.

## 2017-08-22 NOTE — Telephone Encounter (Signed)
[facility] to see if they have assigned home health for wound check/dressing changes. This nurse spoke to patient's sister who stated she is doing dressing changes and has questions. She stated home health for PT and Baths but not sure if someone is coming out for post-op wound care. [facility] to call me back this afternoon.

## 2017-08-23 ENCOUNTER — Inpatient Hospital Stay (HOSPITAL_COMMUNITY)
Admission: AD | Admit: 2017-08-23 | Discharge: 2017-09-03 | DRG: 475 | Disposition: A | Discharge status: Home Health | Payer: Medicare HMO | Source: Ambulatory Visit | Attending: Vascular Surgery | Admitting: Vascular Surgery

## 2017-08-23 ENCOUNTER — Ambulatory Visit (INDEPENDENT_AMBULATORY_CARE_PROVIDER_SITE_OTHER): Payer: Medicare HMO | Admitting: Vascular Surgery

## 2017-08-23 ENCOUNTER — Ambulatory Visit: Payer: Medicare HMO | Admitting: Vascular Surgery

## 2017-08-23 ENCOUNTER — Other Ambulatory Visit: Payer: Self-pay

## 2017-08-23 ENCOUNTER — Encounter: Payer: Self-pay | Admitting: Vascular Surgery

## 2017-08-23 ENCOUNTER — Encounter (HOSPITAL_COMMUNITY): Payer: Self-pay | Admitting: *Deleted

## 2017-08-23 VITALS — BP 137/77 | HR 75 | Temp 97.5°F | Resp 20 | Ht 65.0 in | Wt 217.0 lb

## 2017-08-23 DIAGNOSIS — Z89612 Acquired absence of left leg above knee: Secondary | ICD-10-CM | POA: Diagnosis not present

## 2017-08-23 DIAGNOSIS — E876 Hypokalemia: Secondary | ICD-10-CM | POA: Diagnosis present

## 2017-08-23 DIAGNOSIS — Z6834 Body mass index (BMI) 34.0-34.9, adult: Secondary | ICD-10-CM | POA: Diagnosis not present

## 2017-08-23 DIAGNOSIS — T8744 Infection of amputation stump, left lower extremity: Secondary | ICD-10-CM | POA: Diagnosis not present

## 2017-08-23 DIAGNOSIS — L97929 Non-pressure chronic ulcer of unspecified part of left lower leg with unspecified severity: Secondary | ICD-10-CM | POA: Diagnosis not present

## 2017-08-23 DIAGNOSIS — G4733 Obstructive sleep apnea (adult) (pediatric): Secondary | ICD-10-CM | POA: Diagnosis not present

## 2017-08-23 DIAGNOSIS — Z811 Family history of alcohol abuse and dependence: Secondary | ICD-10-CM

## 2017-08-23 DIAGNOSIS — E785 Hyperlipidemia, unspecified: Secondary | ICD-10-CM | POA: Diagnosis present

## 2017-08-23 DIAGNOSIS — E1365 Other specified diabetes mellitus with hyperglycemia: Secondary | ICD-10-CM

## 2017-08-23 DIAGNOSIS — F329 Major depressive disorder, single episode, unspecified: Secondary | ICD-10-CM | POA: Diagnosis present

## 2017-08-23 DIAGNOSIS — J309 Allergic rhinitis, unspecified: Secondary | ICD-10-CM | POA: Diagnosis not present

## 2017-08-23 DIAGNOSIS — L97519 Non-pressure chronic ulcer of other part of right foot with unspecified severity: Secondary | ICD-10-CM | POA: Diagnosis present

## 2017-08-23 DIAGNOSIS — Z8249 Family history of ischemic heart disease and other diseases of the circulatory system: Secondary | ICD-10-CM | POA: Diagnosis not present

## 2017-08-23 DIAGNOSIS — I10 Essential (primary) hypertension: Secondary | ICD-10-CM

## 2017-08-23 DIAGNOSIS — D62 Acute posthemorrhagic anemia: Secondary | ICD-10-CM

## 2017-08-23 DIAGNOSIS — T8189XA Other complications of procedures, not elsewhere classified, initial encounter: Secondary | ICD-10-CM | POA: Diagnosis not present

## 2017-08-23 DIAGNOSIS — Z8349 Family history of other endocrine, nutritional and metabolic diseases: Secondary | ICD-10-CM | POA: Diagnosis not present

## 2017-08-23 DIAGNOSIS — E1121 Type 2 diabetes mellitus with diabetic nephropathy: Secondary | ICD-10-CM | POA: Diagnosis not present

## 2017-08-23 DIAGNOSIS — E1165 Type 2 diabetes mellitus with hyperglycemia: Secondary | ICD-10-CM | POA: Diagnosis present

## 2017-08-23 DIAGNOSIS — E875 Hyperkalemia: Secondary | ICD-10-CM | POA: Diagnosis not present

## 2017-08-23 DIAGNOSIS — E1169 Type 2 diabetes mellitus with other specified complication: Secondary | ICD-10-CM | POA: Diagnosis not present

## 2017-08-23 DIAGNOSIS — E1140 Type 2 diabetes mellitus with diabetic neuropathy, unspecified: Secondary | ICD-10-CM | POA: Diagnosis present

## 2017-08-23 DIAGNOSIS — T8789 Other complications of amputation stump: Secondary | ICD-10-CM

## 2017-08-23 DIAGNOSIS — E669 Obesity, unspecified: Secondary | ICD-10-CM | POA: Diagnosis not present

## 2017-08-23 DIAGNOSIS — IMO0002 Reserved for concepts with insufficient information to code with codable children: Secondary | ICD-10-CM

## 2017-08-23 DIAGNOSIS — I998 Other disorder of circulatory system: Secondary | ICD-10-CM

## 2017-08-23 DIAGNOSIS — E11621 Type 2 diabetes mellitus with foot ulcer: Secondary | ICD-10-CM | POA: Diagnosis not present

## 2017-08-23 DIAGNOSIS — Z794 Long term (current) use of insulin: Secondary | ICD-10-CM | POA: Diagnosis not present

## 2017-08-23 DIAGNOSIS — T8740 Infection of amputation stump, unspecified extremity: Secondary | ICD-10-CM

## 2017-08-23 DIAGNOSIS — T8781 Dehiscence of amputation stump: Secondary | ICD-10-CM | POA: Diagnosis not present

## 2017-08-23 DIAGNOSIS — S78112A Complete traumatic amputation at level between left hip and knee, initial encounter: Secondary | ICD-10-CM

## 2017-08-23 DIAGNOSIS — Z89512 Acquired absence of left leg below knee: Secondary | ICD-10-CM | POA: Diagnosis not present

## 2017-08-23 DIAGNOSIS — R296 Repeated falls: Secondary | ICD-10-CM | POA: Diagnosis not present

## 2017-08-23 DIAGNOSIS — D72829 Elevated white blood cell count, unspecified: Secondary | ICD-10-CM

## 2017-08-23 DIAGNOSIS — E1351 Other specified diabetes mellitus with diabetic peripheral angiopathy without gangrene: Secondary | ICD-10-CM

## 2017-08-23 DIAGNOSIS — Z87891 Personal history of nicotine dependence: Secondary | ICD-10-CM | POA: Diagnosis not present

## 2017-08-23 DIAGNOSIS — Z881 Allergy status to other antibiotic agents status: Secondary | ICD-10-CM | POA: Diagnosis not present

## 2017-08-23 DIAGNOSIS — E1151 Type 2 diabetes mellitus with diabetic peripheral angiopathy without gangrene: Secondary | ICD-10-CM | POA: Diagnosis not present

## 2017-08-23 DIAGNOSIS — Y835 Amputation of limb(s) as the cause of abnormal reaction of the patient, or of later complication, without mention of misadventure at the time of the procedure: Secondary | ICD-10-CM | POA: Diagnosis present

## 2017-08-23 DIAGNOSIS — G8918 Other acute postprocedural pain: Secondary | ICD-10-CM | POA: Diagnosis not present

## 2017-08-23 DIAGNOSIS — T8742 Infection of amputation stump, left upper extremity: Secondary | ICD-10-CM | POA: Diagnosis not present

## 2017-08-23 DIAGNOSIS — I70262 Atherosclerosis of native arteries of extremities with gangrene, left leg: Secondary | ICD-10-CM | POA: Diagnosis not present

## 2017-08-23 HISTORY — DX: Infection of amputation stump, unspecified extremity: T87.40

## 2017-08-23 HISTORY — DX: Complete traumatic amputation at level between left hip and knee, initial encounter: S78.112A

## 2017-08-23 LAB — COMPREHENSIVE METABOLIC PANEL
ALK PHOS: 108 U/L (ref 38–126)
ALT: 26 U/L (ref 14–54)
AST: 19 U/L (ref 15–41)
Albumin: 2.1 g/dL — ABNORMAL LOW (ref 3.5–5.0)
Anion gap: 9 (ref 5–15)
BUN: 8 mg/dL (ref 6–20)
CALCIUM: 8.6 mg/dL — AB (ref 8.9–10.3)
CO2: 25 mmol/L (ref 22–32)
CREATININE: 0.59 mg/dL (ref 0.44–1.00)
Chloride: 100 mmol/L — ABNORMAL LOW (ref 101–111)
Glucose, Bld: 61 mg/dL — ABNORMAL LOW (ref 65–99)
Potassium: 3.3 mmol/L — ABNORMAL LOW (ref 3.5–5.1)
Sodium: 134 mmol/L — ABNORMAL LOW (ref 135–145)
Total Bilirubin: 0.1 mg/dL — ABNORMAL LOW (ref 0.3–1.2)
Total Protein: 6.6 g/dL (ref 6.5–8.1)

## 2017-08-23 LAB — PROTIME-INR
INR: 1.16
PROTHROMBIN TIME: 14.7 s (ref 11.4–15.2)

## 2017-08-23 LAB — CBC WITH DIFFERENTIAL/PLATELET
BASOS PCT: 1 %
Basophils Absolute: 0.2 10*3/uL — ABNORMAL HIGH (ref 0.0–0.1)
Eosinophils Absolute: 0.2 10*3/uL (ref 0.0–0.7)
Eosinophils Relative: 1 %
HEMATOCRIT: 30.7 % — AB (ref 36.0–46.0)
HEMOGLOBIN: 9.6 g/dL — AB (ref 12.0–15.0)
LYMPHS ABS: 2.6 10*3/uL (ref 0.7–4.0)
LYMPHS PCT: 14 %
MCH: 26.9 pg (ref 26.0–34.0)
MCHC: 31.3 g/dL (ref 30.0–36.0)
MCV: 86 fL (ref 78.0–100.0)
MONOS PCT: 5 %
Monocytes Absolute: 0.9 10*3/uL (ref 0.1–1.0)
NEUTROS ABS: 14.4 10*3/uL — AB (ref 1.7–7.7)
Neutrophils Relative %: 79 %
Platelets: 564 10*3/uL — ABNORMAL HIGH (ref 150–400)
RBC: 3.57 MIL/uL — ABNORMAL LOW (ref 3.87–5.11)
RDW: 14.6 % (ref 11.5–15.5)
WBC: 18.3 10*3/uL — ABNORMAL HIGH (ref 4.0–10.5)

## 2017-08-23 LAB — HEMOGLOBIN A1C
Hgb A1c MFr Bld: 9.3 % — ABNORMAL HIGH (ref 4.8–5.6)
Mean Plasma Glucose: 220.21 mg/dL

## 2017-08-23 LAB — GLUCOSE, CAPILLARY
GLUCOSE-CAPILLARY: 133 mg/dL — AB (ref 65–99)
Glucose-Capillary: 89 mg/dL (ref 65–99)

## 2017-08-23 MED ORDER — HEPARIN SODIUM (PORCINE) 5000 UNIT/ML IJ SOLN
5000.0000 [IU] | Freq: Three times a day (TID) | INTRAMUSCULAR | Status: DC
  Filled 2017-08-23: qty 1

## 2017-08-23 MED ORDER — SODIUM CHLORIDE 0.9 % IV SOLN
1250.0000 mg | Freq: Two times a day (BID) | INTRAVENOUS | Status: DC
  Administered 2017-08-24 – 2017-08-25 (×3): 1250 mg via INTRAVENOUS
  Filled 2017-08-23 (×4): qty 1250

## 2017-08-23 MED ORDER — INSULIN ASPART PROT & ASPART (70-30 MIX) 100 UNIT/ML ~~LOC~~ SUSP
23.0000 [IU] | Freq: Two times a day (BID) | SUBCUTANEOUS | Status: DC
  Filled 2017-08-23: qty 10

## 2017-08-23 MED ORDER — OXYCODONE-ACETAMINOPHEN 5-325 MG PO TABS
1.0000 | ORAL_TABLET | ORAL | Status: DC | PRN
  Administered 2017-08-23 – 2017-08-24 (×3): 2 via ORAL
  Administered 2017-08-24: 1 via ORAL
  Administered 2017-08-25 – 2017-08-28 (×12): 2 via ORAL
  Filled 2017-08-23: qty 2
  Filled 2017-08-23: qty 1
  Filled 2017-08-23 (×14): qty 2

## 2017-08-23 MED ORDER — PRAVASTATIN SODIUM 10 MG PO TABS: 20.0000 mg | ORAL_TABLET | Freq: Every day | ORAL | Status: DC

## 2017-08-23 MED ORDER — MORPHINE SULFATE (PF) 4 MG/ML IV SOLN
2.0000 mg | INTRAVENOUS | Status: DC | PRN
  Administered 2017-08-23 – 2017-08-24 (×5): 2 mg via INTRAVENOUS
  Filled 2017-08-23 (×5): qty 1

## 2017-08-23 MED ORDER — NICOTINE 14 MG/24HR TD PT24
14.0000 mg | MEDICATED_PATCH | Freq: Every day | TRANSDERMAL | Status: DC
  Administered 2017-08-23 – 2017-09-03 (×10): 14 mg via TRANSDERMAL
  Filled 2017-08-23 (×12): qty 1

## 2017-08-23 MED ORDER — OXYCODONE-ACETAMINOPHEN 5-325 MG PO TABS: 1.0000 | ORAL_TABLET | ORAL | Status: DC | PRN

## 2017-08-23 MED ORDER — SERTRALINE HCL 50 MG PO TABS
150.0000 mg | ORAL_TABLET | Freq: Every day | ORAL | Status: DC
  Administered 2017-08-23 – 2017-09-03 (×11): 150 mg via ORAL
  Filled 2017-08-23 (×12): qty 1

## 2017-08-23 MED ORDER — SODIUM CHLORIDE 0.9% FLUSH: 3.0000 mL | INTRAVENOUS | Status: DC | PRN

## 2017-08-23 MED ORDER — ONDANSETRON HCL 4 MG/2ML IJ SOLN: 4.0000 mg | Freq: Four times a day (QID) | INTRAMUSCULAR | Status: DC | PRN

## 2017-08-23 MED ORDER — GUAIFENESIN-DM 100-10 MG/5ML PO SYRP: 15.0000 mL | ORAL_SOLUTION | ORAL | Status: DC | PRN

## 2017-08-23 MED ORDER — METOPROLOL TARTRATE 5 MG/5ML IV SOLN: 2.0000 mg | INTRAVENOUS | Status: DC | PRN

## 2017-08-23 MED ORDER — ACETAMINOPHEN 325 MG PO TABS
325.0000 mg | ORAL_TABLET | ORAL | Status: DC | PRN
  Administered 2017-08-24: 650 mg via ORAL
  Filled 2017-08-23: qty 2

## 2017-08-23 MED ORDER — PANTOPRAZOLE SODIUM 40 MG PO TBEC: 40.0000 mg | DELAYED_RELEASE_TABLET | Freq: Every day | ORAL | Status: DC

## 2017-08-23 MED ORDER — INSULIN ASPART PROT & ASPART (70-30 MIX) 100 UNIT/ML ~~LOC~~ SUSP
90.0000 [IU] | Freq: Two times a day (BID) | SUBCUTANEOUS | Status: DC
  Filled 2017-08-23: qty 10

## 2017-08-23 MED ORDER — ALUM & MAG HYDROXIDE-SIMETH 200-200-20 MG/5ML PO SUSP: 15.0000 mL | ORAL | Status: DC | PRN

## 2017-08-23 MED ORDER — HEPARIN SODIUM (PORCINE) 5000 UNIT/ML IJ SOLN
5000.0000 [IU] | Freq: Three times a day (TID) | INTRAMUSCULAR | Status: DC
Start: 2017-08-23 — End: 2017-08-23

## 2017-08-23 MED ORDER — HEPARIN SODIUM (PORCINE) 5000 UNIT/ML IJ SOLN
5000.0000 [IU] | Freq: Three times a day (TID) | INTRAMUSCULAR | Status: DC
  Administered 2017-08-23 – 2017-09-02 (×28): 5000 [IU] via SUBCUTANEOUS
  Filled 2017-08-23 (×28): qty 1

## 2017-08-23 MED ORDER — PANTOPRAZOLE SODIUM 40 MG PO TBEC
40.0000 mg | DELAYED_RELEASE_TABLET | Freq: Every day | ORAL | Status: DC
  Administered 2017-08-24 – 2017-08-26 (×3): 40 mg via ORAL
  Filled 2017-08-23 (×3): qty 1

## 2017-08-23 MED ORDER — PREGABALIN 75 MG PO CAPS
75.0000 mg | ORAL_CAPSULE | Freq: Two times a day (BID) | ORAL | Status: DC
  Administered 2017-08-23 – 2017-09-03 (×21): 75 mg via ORAL
  Filled 2017-08-23 (×22): qty 1

## 2017-08-23 MED ORDER — LOSARTAN POTASSIUM 50 MG PO TABS: 100.0000 mg | ORAL_TABLET | Freq: Every day | ORAL | Status: DC

## 2017-08-23 MED ORDER — COLLAGENASE 250 UNIT/GM EX OINT
1.0000 "application " | TOPICAL_OINTMENT | Freq: Every day | CUTANEOUS | Status: DC
  Administered 2017-08-23 – 2017-09-02 (×6): 1 via TOPICAL
  Filled 2017-08-23: qty 30

## 2017-08-23 MED ORDER — ONDANSETRON HCL 4 MG PO TABS: 4.0000 mg | ORAL_TABLET | Freq: Four times a day (QID) | ORAL | Status: DC | PRN

## 2017-08-23 MED ORDER — LORATADINE 10 MG PO TABS
10.0000 mg | ORAL_TABLET | Freq: Every day | ORAL | Status: DC
  Administered 2017-08-24 – 2017-09-03 (×10): 10 mg via ORAL
  Filled 2017-08-23 (×11): qty 1

## 2017-08-23 MED ORDER — ACETAMINOPHEN 650 MG RE SUPP: 325.0000 mg | RECTAL | Status: DC | PRN

## 2017-08-23 MED ORDER — MELATONIN 3 MG PO TABS
3.0000 mg | ORAL_TABLET | Freq: Every day | ORAL | Status: DC
  Administered 2017-08-23 – 2017-09-02 (×11): 3 mg via ORAL
  Filled 2017-08-23 (×11): qty 1

## 2017-08-23 MED ORDER — SORBITOL 70 % SOLN
30.0000 mL | Freq: Every day | Status: DC | PRN
  Filled 2017-08-23: qty 30

## 2017-08-23 MED ORDER — HYDRALAZINE HCL 20 MG/ML IJ SOLN: 5.0000 mg | INTRAMUSCULAR | Status: DC | PRN

## 2017-08-23 MED ORDER — SODIUM CHLORIDE 0.9% FLUSH
3.0000 mL | Freq: Two times a day (BID) | INTRAVENOUS | Status: DC
  Administered 2017-08-26: 3 mL via INTRAVENOUS

## 2017-08-23 MED ORDER — SERTRALINE HCL 50 MG PO TABS: 150.0000 mg | ORAL_TABLET | Freq: Every day | ORAL | Status: DC

## 2017-08-23 MED ORDER — INSULIN ASPART PROT & ASPART (70-30 MIX) 100 UNIT/ML ~~LOC~~ SUSP
70.0000 [IU] | Freq: Two times a day (BID) | SUBCUTANEOUS | Status: DC
  Administered 2017-08-23 – 2017-09-03 (×20): 70 [IU] via SUBCUTANEOUS
  Filled 2017-08-23 (×4): qty 10

## 2017-08-23 MED ORDER — LABETALOL HCL 5 MG/ML IV SOLN
10.0000 mg | INTRAVENOUS | Status: DC | PRN
  Filled 2017-08-23: qty 4

## 2017-08-23 MED ORDER — COLLAGENASE 250 UNIT/GM EX OINT
TOPICAL_OINTMENT | Freq: Every day | CUTANEOUS | Status: DC
  Administered 2017-08-23 – 2017-09-03 (×9): via TOPICAL
  Filled 2017-08-23: qty 30

## 2017-08-23 MED ORDER — AMITRIPTYLINE HCL 25 MG PO TABS
25.0000 mg | ORAL_TABLET | Freq: Every day | ORAL | Status: DC
  Administered 2017-08-23 – 2017-09-02 (×11): 25 mg via ORAL
  Filled 2017-08-23 (×11): qty 1

## 2017-08-23 MED ORDER — ADULT MULTIVITAMIN W/MINERALS CH
1.0000 | ORAL_TABLET | Freq: Every day | ORAL | Status: DC
  Administered 2017-08-23 – 2017-09-03 (×11): 1 via ORAL
  Filled 2017-08-23 (×12): qty 1

## 2017-08-23 MED ORDER — TRAZODONE HCL 50 MG PO TABS
50.0000 mg | ORAL_TABLET | Freq: Every day | ORAL | Status: DC
  Administered 2017-08-23 – 2017-09-02 (×11): 50 mg via ORAL
  Filled 2017-08-23 (×11): qty 1

## 2017-08-23 MED ORDER — PIPERACILLIN-TAZOBACTAM 3.375 G IVPB 30 MIN
3.3750 g | Freq: Once | INTRAVENOUS | Status: AC
  Administered 2017-08-23: 3.375 g via INTRAVENOUS
  Filled 2017-08-23 (×2): qty 50

## 2017-08-23 MED ORDER — PIPERACILLIN-TAZOBACTAM 3.375 G IVPB
3.3750 g | Freq: Three times a day (TID) | INTRAVENOUS | Status: DC
  Administered 2017-08-23 – 2017-09-02 (×30): 3.375 g via INTRAVENOUS
  Filled 2017-08-23 (×32): qty 50

## 2017-08-23 MED ORDER — INSULIN ASPART 100 UNIT/ML ~~LOC~~ SOLN: 0.0000 [IU] | Freq: Three times a day (TID) | SUBCUTANEOUS | Status: DC

## 2017-08-23 MED ORDER — PREMIER PROTEIN SHAKE
2.0000 [oz_av] | Freq: Three times a day (TID) | ORAL | Status: DC
  Administered 2017-08-23 – 2017-08-29 (×12): 2 [oz_av] via ORAL
  Filled 2017-08-23 (×44): qty 325.31

## 2017-08-23 MED ORDER — AMITRIPTYLINE HCL 25 MG PO TABS: 25.0000 mg | ORAL_TABLET | Freq: Every day | ORAL | Status: DC

## 2017-08-23 MED ORDER — PRAVASTATIN SODIUM 10 MG PO TABS
20.0000 mg | ORAL_TABLET | Freq: Every day | ORAL | Status: DC
  Administered 2017-08-23 – 2017-09-02 (×11): 20 mg via ORAL
  Filled 2017-08-23 (×12): qty 2

## 2017-08-23 MED ORDER — PHENOL 1.4 % MT LIQD: 1.0000 | OROMUCOSAL | Status: DC | PRN

## 2017-08-23 MED ORDER — POTASSIUM CHLORIDE CRYS ER 20 MEQ PO TBCR
20.0000 meq | EXTENDED_RELEASE_TABLET | Freq: Once | ORAL | Status: AC
  Administered 2017-08-23: 40 meq via ORAL
  Filled 2017-08-23: qty 2

## 2017-08-23 MED ORDER — ZINC 50 MG PO TABS: 50.0000 mg | ORAL_TABLET | Freq: Every day | ORAL | Status: DC

## 2017-08-23 MED ORDER — VANCOMYCIN HCL 10 G IV SOLR
2000.0000 mg | Freq: Once | INTRAVENOUS | Status: AC
  Administered 2017-08-23: 2000 mg via INTRAVENOUS
  Filled 2017-08-23 (×2): qty 2000

## 2017-08-23 MED ORDER — GABAPENTIN 300 MG PO CAPS
300.0000 mg | ORAL_CAPSULE | Freq: Three times a day (TID) | ORAL | Status: DC | PRN
  Administered 2017-08-23: 300 mg via ORAL
  Filled 2017-08-23: qty 1

## 2017-08-23 MED ORDER — SODIUM CHLORIDE 0.9 % IV SOLN
250.0000 mL | INTRAVENOUS | Status: DC | PRN
  Administered 2017-08-23: 250 mL via INTRAVENOUS

## 2017-08-23 MED ORDER — LOSARTAN POTASSIUM 50 MG PO TABS
100.0000 mg | ORAL_TABLET | Freq: Every day | ORAL | Status: DC
  Administered 2017-08-24 – 2017-09-03 (×10): 100 mg via ORAL
  Filled 2017-08-23 (×11): qty 2

## 2017-08-23 MED ORDER — POLYETHYLENE GLYCOL 3350 17 G PO PACK: 17.0000 g | PACK | Freq: Every day | ORAL | Status: DC | PRN

## 2017-08-23 MED ORDER — MELATONIN 5 MG PO TABS
5.0000 mg | ORAL_TABLET | Freq: Every day | ORAL | Status: DC
  Filled 2017-08-23: qty 1

## 2017-08-23 MED ORDER — BISACODYL 10 MG RE SUPP: 10.0000 mg | Freq: Every day | RECTAL | Status: DC | PRN

## 2017-08-23 MED ORDER — INSULIN ASPART 100 UNIT/ML ~~LOC~~ SOLN
0.0000 [IU] | Freq: Three times a day (TID) | SUBCUTANEOUS | Status: DC
  Administered 2017-08-23: 2 [IU] via SUBCUTANEOUS
  Administered 2017-08-24 – 2017-08-25 (×3): 5 [IU] via SUBCUTANEOUS
  Administered 2017-08-26: 3 [IU] via SUBCUTANEOUS
  Administered 2017-08-26: 5 [IU] via SUBCUTANEOUS
  Administered 2017-08-27: 11 [IU] via SUBCUTANEOUS
  Administered 2017-08-27: 5 [IU] via SUBCUTANEOUS
  Administered 2017-08-28: 3 [IU] via SUBCUTANEOUS
  Administered 2017-08-28: 2 [IU] via SUBCUTANEOUS
  Administered 2017-08-28: 8 [IU] via SUBCUTANEOUS
  Administered 2017-08-29: 2 [IU] via SUBCUTANEOUS
  Administered 2017-08-29: 3 [IU] via SUBCUTANEOUS
  Administered 2017-08-30: 8 [IU] via SUBCUTANEOUS
  Administered 2017-08-30: 11 [IU] via SUBCUTANEOUS
  Administered 2017-08-31: 3 [IU] via SUBCUTANEOUS
  Administered 2017-09-01: 5 [IU] via SUBCUTANEOUS
  Administered 2017-09-01: 2 [IU] via SUBCUTANEOUS
  Administered 2017-09-02: 5 [IU] via SUBCUTANEOUS
  Administered 2017-09-03: 2 [IU] via SUBCUTANEOUS

## 2017-08-23 NOTE — Progress Notes (Signed)
 Patient ID: Danielle Buchanan, female   DOB: 07/28/1956, 61 y.o.   MRN: [id number]  Reason for Consult: Follow-up (HH nurse ref for stump check )   Referred by Webb, Carol, MD  Subjective:     HPI:Danielle Buchanan is a 61 y.o. female presents in follow-up from recent left revision of her left below-knee amputation.  She now has had significant drainage and pain.  She does not have any fevers or chills.  She is not on antibiotics.  She is followed by wound care for her right fourth and fifth toe ulceration.      Past Medical History:  Diagnosis Date  . Allergic rhinitis   . Depression   . Diabetes (HCC)   . Dyslipidemia   . HTN (hypertension)   . Insomnia   . OSA (obstructive sleep apnea)    no cpap  . Urinary incontinence         Family History  Problem Relation Age of Onset  . Heart attack Mother   . Alcohol abuse Mother   . Heart attack Father   . Alcohol abuse Father   . Heart attack Sister   . Heart attack Brother   . Hypercholesterolemia Sister   . Diabetes Neg Hx         Past Surgical History:  Procedure Laterality Date  . ABDOMINAL AORTOGRAM W/LOWER EXTREMITY N/A 06/24/2017   Performed by Yitzhak Awan Christopher, MD at [facility]  . AMPUTATION  BELOW KNEE Left 07/23/2017   Performed by Venba Zenner Christopher, MD at [facility] OR  . CARPAL TUNNEL RELEASE Right   . COLONOSCOPY    . REVISION BELOW KNEE Left 08/12/2017   Performed by Shakaya Bhullar Christopher, MD at [facility] OR    Short Social History:  Social History        Tobacco Use  . Smoking status: Former Smoker    Packs/day: 1.00    Years: 51.00    Pack years: 51.00    Types: Cigarettes    Last attempt to quit: 06/08/2017    Years since quitting: 0.2  . Smokeless tobacco: Never Used  Substance Use Topics  . Alcohol use: No    Alcohol/week: 0.0 oz        Allergies  Allergen Reactions  . Doxycycline Rash          Current Outpatient Medications    Medication Sig Dispense Refill  . amitriptyline (ELAVIL) 25 MG tablet Take 25 mg by mouth at bedtime.     . collagenase (SANTYL) ointment Apply 1 application topically daily.    . [employer] NICOTINE TRANSDERMAL SYS 14 MG/24HR patch Apply 1 patch daily for smoking cessation  11  . insulin NPH-regular Human (NOVOLIN 70/30) (70-30) 100 UNIT/ML injection 120 units with breakfast, and 90 units with the evening meal (Patient taking differently: Inject 90 Units into the skin 2 (two) times daily. ) 70 mL 11  . losartan (COZAAR) 100 MG tablet Take 100 mg by mouth daily.    . Melatonin 5 MG TABS Take 5 mg by mouth at bedtime.    . oxyCODONE-acetaminophen (PERCOCET/ROXICET) 5-325 MG tablet Take 1-2 tablets every 4 (four) hours as needed by mouth for moderate pain. 30 tablet 0  . pravastatin (PRAVACHOL) 20 MG tablet Take 20 mg by mouth daily.     . sertraline (ZOLOFT) 100 MG tablet Take 150 mg by mouth daily.     . traZODone (DESYREL) 50 MG tablet Take 50 mg by mouth at bedtime.    .   VITAMIN E PO Take 180 mg by mouth daily.    . Zinc 50 MG TABS Take 50 mg by mouth daily.    . cephALEXin (KEFLEX) 500 MG capsule Take 1 capsule (500 mg total) by mouth 3 (three) times daily. (Patient not taking: Reported on 08/09/2017) 42 capsule 0  . oxyCODONE-acetaminophen (PERCOCET/ROXICET) 5-325 MG tablet Take 1-2 tablets by mouth every 6 (six) hours as needed for severe pain. (Patient not taking: Reported on 08/09/2017) 20 tablet 0   No current facility-administered medications for this visit.     Review of Systems  Constitutional:  Constitutional negative. HENT: HENT negative.  Eyes: Eyes negative.  Respiratory: Respiratory negative.  Cardiovascular: Cardiovascular negative.  GI: Gastrointestinal negative.  Musculoskeletal: Musculoskeletal negative.  Skin: Skin negative.  Neurological: Neurological negative. Hematologic: Hematologic/lymphatic negative.  Psychiatric: Psychiatric negative.         Objective:  Objective      Vitals:   08/23/17 1011  BP: 137/77  Pulse: 75  Resp: 20  Temp: (!) 97.5 F (36.4 C)  TempSrc: Oral  SpO2: 98%  Weight: 217 lb (98.4 kg)  Height: 5' 5" (1.651 m)   Body mass index is 36.11 kg/m.  Physical Exam  Constitutional: She is oriented to person, place, and time. She appears well-developed.  HENT:  Head: Normocephalic.  Eyes: Pupils are equal, round, and reactive to light.  Neck: Normal range of motion.  Cardiovascular: Normal rate.  Pulmonary/Chest: Effort normal.  Abdominal: Soft.  Musculoskeletal:  Draining purulence left bka  Neurological: She is alert and oriented to person, place, and time.  Skin:  Breakdown around left bka incision  Psychiatric: She has a normal mood and affect. Her behavior is normal. Thought content normal.     Assessment/Plan:   61-year-old female status post revision of left BKA now with breakdown of this and purulent drainage.  She will need left above-knee amputation.  Will admit her to the hospital and IV antibiotics with IV pain control and plan amputation at the nearest convenience.  She demonstrates good understanding we will get her admitted today.   Reham Slabaugh Christopher Morgyn Marut MD [facility]    

## 2017-08-23 NOTE — Progress Notes (Signed)
Initial Nutrition Assessment  DOCUMENTATION CODES:   Obesity unspecified  INTERVENTION:   -Premier Protein TID, each supplement provides 160 kcals and 30 grams protein -MVI daily  NUTRITION DIAGNOSIS:   Increased nutrient needs related to wound healing as evidenced by estimated needs.  GOAL:   Patient will meet greater than or equal to 90% of their needs  MONITOR:   PO intake, Supplement acceptance, Labs, Weight trends, Skin, I & O's  REASON FOR ASSESSMENT:   Malnutrition Screening Tool    ASSESSMENT:   Danielle Buchanan is a 61 y.o. female presents in follow-up from recent left revision of her left below-knee amputation.  She now has had significant drainage and pain.  She does not have any fevers or chills.  She is not on antibiotics.  She is followed by wound care for her right fourth and fifth toe ulceration.  Pt admitted for planned lt AKA.   Spoke with pt, who was very drowsy at time of visit. She reports very poor appetite over the past 2 months, secondary to pain. Pt reports consuming 1 meal per day (peanut butter). Noted pt consumed 100% of lunch tray (pt reported lunch was a peanut butter sandwich).   Per chart review, pt had lt BKA on 07/23/17 and revision on 08/12/17. Pt estimated a 20# wt loss over the past 2 months. Wt changes difficult to interpret secondary to amputations.   Labs reviewed: Na: 131, CBGS: 310-388 (inpatient orders for glycemic control are 0-15 units insulin aspart TID with meals and 70 units of insulin aspart protamine-aspart BID). Pt with poor glycemic control at home; last Hgb A1c: 9.3 (08/23/17). Per DM coordinator note, home DM medication regimen 70/30 insulin 90 units BID.   NUTRITION - FOCUSED PHYSICAL EXAM:    Most Recent Value  Orbital Region  No depletion  Upper Arm Region  No depletion  Thoracic and Lumbar Region  No depletion  Buccal Region  No depletion  Temple Region  No depletion  Clavicle Bone Region  No depletion  Clavicle  and Acromion Bone Region  No depletion  Scapular Bone Region  No depletion  Dorsal Hand  No depletion  Patellar Region  No depletion  Anterior Thigh Region  No depletion  Posterior Calf Region  No depletion  Edema (RD Assessment)  Mild  Hair  Reviewed  Eyes  Reviewed  Mouth  Reviewed  Skin  Reviewed  Nails  Reviewed       Diet Order:  Diet Carb Modified Fluid consistency: Thin; Room service appropriate? Yes  EDUCATION NEEDS:   Not appropriate for education at this time  Skin:  Skin Assessment: Skin Integrity Issues: Skin Integrity Issues:: Diabetic Ulcer, Incisions Diabetic Ulcer: rt foot Incisions: lt BKA site  Last BM:  08/23/17  Height:   Ht Readings from Last 1 Encounters:  08/23/17 5\' 5"  (1.651 m)    Weight:   Wt Readings from Last 1 Encounters:  08/23/17 208 lb 12.4 oz (94.7 kg)    Ideal Body Weight:  53.1 kg  BMI:  Body mass index is 34.74 kg/m.  Estimated Nutritional Needs:   Kcal:  1600-1800  Protein:  105-120 grams  Fluid:  >1.6 L    Danielle Buchanan A. Mayford KnifeWilliams, RD, LDN, CDE Pager: [phone] After hours Pager: [phone]

## 2017-08-23 NOTE — Progress Notes (Signed)
Pt lying in bed.   C/o pain LLE.  Medicated with morphine.  Pulses palpable everywhere.  Dressing to BLE intact.  No s/s of any acute distress or pain.  Call bell in reach.

## 2017-08-23 NOTE — Progress Notes (Signed)
Received diabetes coordinator consult.  Will continue to monitor blood sugars while in the hospital. Goal is to keep blood sugars less than 180 mg/dl.  Home DM meds: 70/30 insulin 90 units BID Current meds: 70/30 insulin 90 units BID, Novolog MODERATE TID with meals.   Smith MinceKendra Montana Bryngelson RN BSN CDE Diabetes Coordinator Pager: [phone]  8am-5pm

## 2017-08-23 NOTE — Progress Notes (Addendum)
Pharmacy Antibiotic Note  Josefine ClassJanice Liuzzi is a 61 y.o. female admitted on 08/23/2017 with wound infection.  Pharmacy has been consulted for Zosyn and vancomycin dosing. Pt had recent revision of left below-knee amputation now with significant drainage and pain. WBC wnl, SCr 0.79. CrCl ~ 85 mL/min.   Plan: -Zosyn 3.375 gm IV Q 8 hours -Vancomycin 2 gm IV once, then vancomycin 1250 mg IV Q 12 hours -Monitor CBC, renal fx, cultures and clinical progress -VT at SS  Height: 5\' 5"  (165.1 cm) Weight: 208 lb 12.4 oz (94.7 kg) IBW/kg (Calculated) : 57  Temp (24hrs), Avg:97.7 F (36.5 C), Min:97.5 F (36.4 C), Max:97.9 F (36.6 C)  No results for input(s): WBC, CREATININE, LATICACIDVEN, VANCOTROUGH, VANCOPEAK, VANCORANDOM, GENTTROUGH, GENTPEAK, GENTRANDOM, TOBRATROUGH, TOBRAPEAK, TOBRARND, AMIKACINPEAK, AMIKACINTROU, AMIKACIN in the last 168 hours.  Estimated Creatinine Clearance: 84.1 mL/min (by C-G formula based on SCr of 0.79 mg/dL).    Allergies  Allergen Reactions  . Doxycycline Rash    Antimicrobials this admission: Vanc 11/16 >>  Zosyn 11/16 >>   Dose adjustments this admission: None   Microbiology results:  Thank you for allowing pharmacy to be a part of this patient's care.  Vinnie LevelBenjamin Trevonne Nyland, PharmD., BCPS Clinical Pharmacist Pager [phone]

## 2017-08-23 NOTE — H&P (Signed)
Patient ID: Danielle Buchanan, female   DOB: [birth year], 61 y.o.   MRN: [id number]  Reason for Consult: Follow-up [facility](HH nurse ref for stump check )   Referred by Danielle Buchanan, Carol, MD  Subjective:     WUJ:WJXBJYHPI:Danielle Buchanan is a 61 y.o. female presents in follow-up from recent left revision of her left below-knee amputation.  She now has had significant drainage and pain.  She does not have any fevers or chills.  She is not on antibiotics.  She is followed by wound care for her right fourth and fifth toe ulceration.      Past Medical History:  Diagnosis Date  . Allergic rhinitis   . Depression   . Diabetes (HCC)   . Dyslipidemia   . HTN (hypertension)   . Insomnia   . OSA (obstructive sleep apnea)    no cpap  . Urinary incontinence         Family History  Problem Relation Age of Onset  . Heart attack Mother   . Alcohol abuse Mother   . Heart attack Father   . Alcohol abuse Father   . Heart attack Sister   . Heart attack Brother   . Hypercholesterolemia Sister   . Diabetes Neg Hx         Past Surgical History:  Procedure Laterality Date  . ABDOMINAL AORTOGRAM W/LOWER EXTREMITY N/A 06/24/2017   Performed by Maeola Harmanain, Lavella Myren Christopher[facility]  . AMPUTATION  BELOW KNEE Left 07/23/2017   Performed by Maeola Harmanain, Ramie Erman [facility], [facility]  . CARPAL TUNNEL RELEASE Right   . COLONOSCOPY    . REVISION BELOW KNEE Left 08/12/2017   Performed by Maeola Harmanain, Uchenna Rappaport [facility]    Short Social History:  Social History        Tobacco Use  . Smoking status: Former Smoker    Packs/day: 1.00    Years: 51.00    Pack years: 51.00    Types: Cigarettes    Last attempt to quit: 06/08/2017    Years since quitting: 0.2  . Smokeless tobacco: Never Used  Substance Use Topics  . Alcohol use: No    Alcohol/week: 0.0 oz        Allergies  Allergen Reactions  . Doxycycline Rash          Current Outpatient Medications    Medication Sig Dispense Refill  . amitriptyline (ELAVIL) 25 MG tablet Take 25 mg by mouth at bedtime.     . collagenase (SANTYL) ointment Apply 1 application topically daily.    . [employer] NICOTINE TRANSDERMAL SYS 14 MG/24HR patch Apply 1 patch daily for smoking cessation  11  . insulin NPH-regular Human (NOVOLIN 70/30) (70-30) 100 UNIT/ML injection 120 units with breakfast, and 90 units with the evening meal (Patient taking differently: Inject 90 Units into the skin 2 (two) times daily. ) 70 mL 11  . losartan (COZAAR) 100 MG tablet Take 100 mg by mouth daily.    . Melatonin 5 MG TABS Take 5 mg by mouth at bedtime.    Marland Kitchen. oxyCODONE-acetaminophen (PERCOCET/ROXICET) 5-325 MG tablet Take 1-2 tablets every 4 (four) hours as needed by mouth for moderate pain. 30 tablet 0  . pravastatin (PRAVACHOL) 20 MG tablet Take 20 mg by mouth daily.     . sertraline (ZOLOFT) 100 MG tablet Take 150 mg by mouth daily.     . traZODone (DESYREL) 50 MG tablet Take 50 mg by mouth at bedtime.    .Marland Kitchen  VITAMIN E PO Take 180 mg by mouth daily.    . Zinc 50 MG TABS Take 50 mg by mouth daily.    . cephALEXin (KEFLEX) 500 MG capsule Take 1 capsule (500 mg total) by mouth 3 (three) times daily. (Patient not taking: Reported on 08/09/2017) 42 capsule 0  . oxyCODONE-acetaminophen (PERCOCET/ROXICET) 5-325 MG tablet Take 1-2 tablets by mouth every 6 (six) hours as needed for severe pain. (Patient not taking: Reported on 08/09/2017) 20 tablet 0   No current facility-administered medications for this visit.     Review of Systems  Constitutional:  Constitutional negative. HENT: HENT negative.  Eyes: Eyes negative.  Respiratory: Respiratory negative.  Cardiovascular: Cardiovascular negative.  GI: Gastrointestinal negative.  Musculoskeletal: Musculoskeletal negative.  Skin: Skin negative.  Neurological: Neurological negative. Hematologic: Hematologic/lymphatic negative.  Psychiatric: Psychiatric negative.         Objective:  Objective      Vitals:   08/23/17 1011  BP: 137/77  Pulse: 75  Resp: 20  Temp: (!) 97.5 F (36.4 C)  TempSrc: Oral  SpO2: 98%  Weight: 217 lb (98.4 kg)  Height: 5\' 5"  (1.651 m)   Body mass index is 36.11 kg/m.  Physical Exam  Constitutional: She is oriented to person, place, and time. She appears well-developed.  HENT:  Head: Normocephalic.  Eyes: Pupils are equal, round, and reactive to light.  Neck: Normal range of motion.  Cardiovascular: Normal rate.  Pulmonary/Chest: Effort normal.  Abdominal: Soft.  Musculoskeletal:  Draining purulence left bka  Neurological: She is alert and oriented to person, place, and time.  Skin:  Breakdown around left bka incision  Psychiatric: She has a normal mood and affect. Her behavior is normal. Thought content normal.     Assessment/Plan:   61 year old female status post revision of left BKA now with breakdown of this and purulent drainage.  She will need left above-knee amputation.  Will admit her to the hospital and IV antibiotics with IV pain control and plan amputation at the nearest convenience.  She demonstrates good understanding we will get her admitted today.   Maeola HarmanBrandon Christopher Zahria Ding MD [facility]

## 2017-08-23 NOTE — Progress Notes (Signed)
Report obtained from prior nurse.  Charge nurse to oversea patients until I arrive back to unit around 1620 after lunch 

## 2017-08-24 LAB — GLUCOSE, CAPILLARY
GLUCOSE-CAPILLARY: 228 mg/dL — AB (ref 65–99)
GLUCOSE-CAPILLARY: 125 mg/dL — AB (ref 65–99)
Glucose-Capillary: 92 mg/dL (ref 65–99)
Glucose-Capillary: 81 mg/dL (ref 65–99)

## 2017-08-24 MED ORDER — GABAPENTIN 300 MG PO CAPS
300.0000 mg | ORAL_CAPSULE | Freq: Three times a day (TID) | ORAL | Status: DC | PRN
  Administered 2017-08-25 – 2017-09-02 (×4): 300 mg via ORAL
  Filled 2017-08-24 (×4): qty 1

## 2017-08-24 MED ORDER — HYDROMORPHONE HCL 1 MG/ML IJ SOLN
0.5000 mg | INTRAMUSCULAR | Status: DC | PRN
  Administered 2017-08-24 – 2017-08-28 (×15): 0.5 mg via INTRAVENOUS
  Filled 2017-08-24 (×15): qty 1

## 2017-08-24 NOTE — Progress Notes (Signed)
Inpatient Rehabilitation  Per OT/PT request, patient was screened by Jenalee Trevizo for appropriateness for an Inpatient Acute Rehab consult.  At this time we are recommending an Inpatient Rehab consult.  Text paged MD to notify; please order if you are agreeable.    Carole Deere, M.A., CCC/SLP Admission Coordinator  [facility]  Cell [phone]  

## 2017-08-24 NOTE — Progress Notes (Signed)
PT Cancellation Note  Patient Details Name: Danielle Buchanan ClassJanice Borre MRN: [id number] DOB: December 27, 1955   Cancelled Treatment:    Reason Eval/Treat Not Completed: Pain limiting ability to participate.  Pt had such severe slowly relenting pain after mobilizing earlier with OT she did not feel up to doing it again this PM.  She was agreeable for PT to check back tomorrow and requested that the PT ask the RN to pre-medicate her so it will not be as torturous.    Thanks,    Rollene Rotundaebecca B. Hezakiah Champeau, PT, DPT [phone]   08/24/2017, 5:21 PM

## 2017-08-24 NOTE — Evaluation (Signed)
Occupational Therapy Evaluation Patient Details Name: Danielle Buchanan MRN: [id number] DOB: 1955/12/09 Today's Date: 08/24/2017    History of Present Illness Pt is a 61 y.o. female presents in follow-up from recent left revision of her left below-knee amputation.  She now has had significant drainage and pain.  She is followed by wound care for her right fourth and fifth toe ulceration. L AKA planned for early this week.   Clinical Impression   Pt presents with left LE pain during evaluation.  She requires mod-max assist for squat pivot transfer.  Prior to current admission, she was completing dressing tasks and squat pivot transfer at mod independent level with occasional assist from sister for transfers.  Pt reports increasing levels of pain with functional mobility and ADLs.  Pt returned to bed at end of session with RN present to administer pain meds. Recommending CIR to further progress patient rehab before return home.  Will continue to follow acutely in order to maximize safety and independence with ADLs and to address deficits listed below.    Follow Up Recommendations  CIR    Equipment Recommendations  (defer to next venue)    Recommendations for Other Services Rehab consult     Precautions / Restrictions Precautions Precautions: Fall Restrictions Weight Bearing Restrictions: Yes LLE Weight Bearing: Non weight bearing      Mobility Bed Mobility Overal bed mobility: Needs Assistance Bed Mobility: Supine to Sit;Sit to Supine     Supine to sit: Min assist Sit to supine: Min guard      Transfers Overall transfer level: Needs assistance         Squat pivot transfers: Mod assist;Max assist     General transfer comment: Mod assist to transfer to right side and max assist to transfer to left side.    Balance                                           ADL either performed or assessed with clinical judgement   ADL Overall ADL's : Needs  assistance/impaired Eating/Feeding: Set up;Sitting   Grooming: Min guard;Sitting   Upper Body Bathing: Sitting;Set up;Supervision/ safety   Lower Body Bathing: Sitting/lateral leans;Maximal assistance   Upper Body Dressing : Minimal assistance;Sitting   Lower Body Dressing: Sit to/from stand;Maximal assistance   Toilet Transfer: Moderate assistance;Maximal assistance;BSC;Squat-pivot Toilet Transfer Details (indicate cue type and reason): Mod assist to transfer [facility] bed to [facility] and max assist to transfer from [facility] to bed (transferring to left side going back to bed). Toileting- Clothing Manipulation and Hygiene: Minimal assistance;Sitting/lateral lean         General ADL Comments: Pt performed toileting tasks. Pt becoming increasingly painful with transfer.     Vision   Vision Assessment?: No apparent visual deficits     Perception     Praxis      Pertinent Vitals/Pain Pain Assessment: 0-10 Pain Score: 10-Worst pain ever Pain Location: L LE Pain Descriptors / Indicators: Operative site guarding;Sharp Pain Intervention(s): Limited activity within patient's tolerance     Hand Dominance     Extremity/Trunk Assessment Upper Extremity Assessment Upper Extremity Assessment: Generalized weakness           Communication     Cognition Arousal/Alertness: Awake/alert Behavior During Therapy: WFL for tasks assessed/performed Overall Cognitive Status: Within Functional Limits for tasks assessed Area of Impairment: Safety/judgement  Safety/Judgement: Decreased awareness of safety;Decreased awareness of deficits     General Comments: Impulsive during transfer   General Comments       Exercises     Shoulder Instructions      Home Living Family/patient expects to be discharged to:: Private residence Living Arrangements: Other relatives Available Help at Discharge: Family;Available 24 hours/day Type of Home: Apartment Home Access:  Level entry     Home Layout: One level     Bathroom Shower/Tub: Chief Strategy OfficerTub/shower unit   Bathroom Toilet: Standard Bathroom Accessibility: Yes How Accessible: Accessible via walker Home Equipment: Walker - 4 wheels;Wheelchair - Manufacturing systems engineermanual;Shower seat      Lives With: Family    Prior Functioning/Environment Level of Independence: Needs assistance  Gait / Transfers Assistance Needed: Occasional assist from sister for squat pivot transfers but is mod indep majority of time. ADL's / Homemaking Assistance Needed: Pt dresses herself. Reliant on assist from sister for bathing and other ADLs            OT Problem List: Decreased strength;Decreased range of motion;Impaired balance (sitting and/or standing);Pain;Decreased knowledge of use of DME or AE;Decreased activity tolerance;Decreased safety awareness;Decreased knowledge of precautions      OT Treatment/Interventions: Self-care/ADL training;Therapeutic exercise;Energy conservation;DME and/or AE instruction;Therapeutic activities;Patient/family education;Balance training    OT Goals(Current goals can be found in the care plan section) Acute Rehab OT Goals Patient Stated Goal: to decrease pain OT Goal Formulation: With patient Time For Goal Achievement: 09/07/17 Potential to Achieve Goals: Good ADL Goals Pt Will Perform Lower Body Bathing: with set-up;sitting/lateral leans Pt Will Perform Upper Body Dressing: with set-up;sitting Pt Will Perform Lower Body Dressing: with set-up;sitting/lateral leans Pt Will Transfer to Toilet: with min assist;squat pivot transfer;bedside commode Pt Will Perform Toileting - Clothing Manipulation and hygiene: with supervision;sitting/lateral leans  OT Frequency: Min 3X/week   Barriers to D/C:            Co-evaluation              AM-PAC PT "6 Clicks" Daily Activity     Outcome Measure Help from another person eating meals?: A Little Help from another person taking care of personal grooming?: A  Little Help from another person toileting, which includes using toliet, bedpan, or urinal?: A Lot Help from another person bathing (including washing, rinsing, drying)?: A Lot Help from another person to put on and taking off regular upper body clothing?: A Little Help from another person to put on and taking off regular lower body clothing?: A Lot 6 Click Score: 15   End of Session Equipment Utilized During Treatment: Gait belt Nurse Communication: Patient requests pain meds;Mobility status  Activity Tolerance: Patient limited by pain Patient left: in bed;with call bell/phone within reach;with nursing/sitter in room  OT Visit Diagnosis: Muscle weakness (generalized) (M62.81);Pain Pain - Right/Left: Left Pain - part of body: Leg                Time: 1610-96041405-1433 OT Time Calculation (min): 28 min Charges:  OT General Charges $OT Visit: 1 Visit OT Evaluation $OT Eval Moderate Complexity: 1 Mod OT Treatments $Self Care/Home Management : 8-22 mins G-Codes:      Cipriano MileJohnson, Amber Williard Elizabeth OTR/L 08/24/2017, 2:48 PM

## 2017-08-24 NOTE — Progress Notes (Signed)
     Left BKA infection non healing Dry dressing in place  IV antibiotics Plan AKA early this week  Mosetta PigeonEmma Maureen Depaul Arizpe PA-C

## 2017-08-24 NOTE — Progress Notes (Signed)
   Daily Progress Note   Assessment/Planning:   Necrotic L BKA  L AKA sometimes this coming week   Subjective  - * No surgery date entered *   C/o pain in L BKA   Objective   Vitals:   08/23/17 2005 08/24/17 0115 08/24/17 0504 08/24/17 1042  BP: (!) 141/69 124/61 138/70 139/67  Pulse: 73 79 74   Resp: 18 18 18 18   Temp: 100 F (37.8 C) 98.8 F (37.1 C) 98.6 F (37 C) 98.7 F (37.1 C)  TempSrc: Axillary Axillary Oral Oral  SpO2: 94% 96% 94% 97%  Weight:      Height:         Intake/Output Summary (Last 24 hours) at 08/24/2017 1454 Last data filed at 08/24/2017 0600 Gross per 24 hour  Intake 930 ml  Output 600 ml  Net 330 ml    PULM  CTAB  CV  RRR  GI  soft, NTND  VASC L BKA bandaged (not changed as recently changed)  NEURO A*O x 3    Laboratory   CBC CBC Latest Ref Rng & Units 08/23/2017 08/15/2017 08/14/2017  WBC 4.0 - 10.5 K/uL 18.3(H) 9.0 12.4(H)  Hemoglobin 12.0 - 15.0 g/dL 1.6(X9.6(L) 0.9(U9.7(L) 10.3(L)  Hematocrit 36.0 - 46.0 % 30.7(L) 30.3(L) 31.7(L)  Platelets 150 - 400 K/uL 564(H) 178 186    BMET    Component Value Date/Time   NA 134 (L) 08/23/2017 1326   K 3.3 (L) 08/23/2017 1326   CL 100 (L) 08/23/2017 1326   CO2 25 08/23/2017 1326   GLUCOSE 61 (L) 08/23/2017 1326   BUN 8 08/23/2017 1326   CREATININE 0.59 08/23/2017 1326   CALCIUM 8.6 (L) 08/23/2017 1326   GFRNONAA >60 08/23/2017 1326   GFRAA >60 08/23/2017 1326     Leonides SakeBrian Chen, MD, FACS [facility] Office: [phone] Pager: [phone]  08/24/2017, 2:54 PM

## 2017-08-25 LAB — GLUCOSE, CAPILLARY
GLUCOSE-CAPILLARY: 246 mg/dL — AB (ref 65–99)
GLUCOSE-CAPILLARY: 223 mg/dL — AB (ref 65–99)
GLUCOSE-CAPILLARY: 228 mg/dL — AB (ref 65–99)
Glucose-Capillary: 223 mg/dL — ABNORMAL HIGH (ref 65–99)

## 2017-08-25 LAB — VANCOMYCIN, TROUGH: VANCOMYCIN TR: 19 ug/mL (ref 15–20)

## 2017-08-25 MED ORDER — VANCOMYCIN HCL IN DEXTROSE 1-5 GM/200ML-% IV SOLN
1000.0000 mg | Freq: Two times a day (BID) | INTRAVENOUS | Status: DC
  Administered 2017-08-25 – 2017-09-02 (×17): 1000 mg via INTRAVENOUS
  Filled 2017-08-25 (×18): qty 200

## 2017-08-25 NOTE — Progress Notes (Addendum)
[facility] [place]  Subjective  - Comfortable over all with PO pain medication.   Objective 131/68 72 99.5 F (37.5 C) (Oral) 14 94%  Intake/Output Summary (Last 24 hours) at 08/25/2017 0844 Last data filed at 08/25/2017 0526 Gross per 24 hour  Intake 120 ml  Output 0 ml  Net 120 ml    Left BKA clean dry dressing changed this am   Assessment/Planning: Left BKA with necrotic tissue not viable Plan AKA early this week  Continue IV antibiotics Tm 99 WBC 18.3  Mosetta Pigeonmma Maureen Collins 08/25/2017 8:44 AM   Addendum  I have independently interviewed and examined the patient, and I agree with the physician assistant's findings.  Awaiting scheduling L AKA by Dr. Randie Heinzain.  Leonides SakeBrian Chen, MD, FACS [facility] Office: [phone] Pager: [phone]  08/25/2017, 10:42 AM   --  Laboratory Lab Results: Recent Labs    08/23/17 1326  WBC 18.3*  HGB 9.6*  HCT 30.7*  PLT 564*   BMET Recent Labs    08/23/17 1326  NA 134*  K 3.3*  CL 100*  CO2 25  GLUCOSE 61*  BUN 8  CREATININE 0.59  CALCIUM 8.6*    COAG Lab Results  Component Value Date   INR 1.16 08/23/2017   INR 0.92 06/09/2017   No results found for: PTT

## 2017-08-25 NOTE — Progress Notes (Signed)
Pharmacy Antibiotic Note  Danielle Buchanan is a 61 y.o. female admitted on 08/23/2017 with wound infection.  Pharmacy managing Zosyn and vancomycin dosing. Pt had recent revision of left below-knee amputation now with significant drainage and pain. WBC 18.3 on 11/16. A 12-hr vanc trough drawn today was supratherapeutic at 19.   Plan: -Zosyn 3.375 gm IV Q 8 hours -Decrease vancomycin to 1000 mg IV Q 12 hours -Monitor CBC, renal fx, cultures and clinical progress -L AKA planned for 11/20   Height: 5\' 5"  (165.1 cm) Weight: 208 lb 12.4 oz (94.7 kg) IBW/kg (Calculated) : 57  Temp (24hrs), Avg:99.5 F (37.5 C), Min:98.4 F (36.9 C), Max:101.5 F (38.6 C)  Recent Labs  Lab 08/23/17 1326 08/25/17 1245  WBC 18.3*  --   CREATININE 0.59  --   VANCOTROUGH  --  19    Estimated Creatinine Clearance: 84.1 mL/min (by C-G formula based on SCr of 0.59 mg/dL).    Allergies  Allergen Reactions  . Doxycycline Rash    Antimicrobials this admission: Vanc 11/16 >>  Zosyn 11/16 >>   Dose adjustments this admission: 11/18: 12-hr VT 19 on Vanc 1250 mg IV Q 24 h  Microbiology results:  Thank you for allowing pharmacy to be a part of this patient's care.  Vinnie LevelBenjamin Yaneli Keithley, PharmD., BCPS Clinical Pharmacist Pager [phone]

## 2017-08-25 NOTE — Evaluation (Signed)
Physical Therapy Evaluation Patient Details Name: Danielle Buchanan MRN: [id number] DOB: 1956-01-21 Today's Date: 08/25/2017   History of Present Illness  Pt is a 61 y.o. female presents in follow-up from recent left revision of her left below-knee amputation.  She now has had significant drainage and pain.  She is followed by wound care for her right fourth and fifth toe ulceration. L AKA planned for early this week.  Clinical Impression   Patient is s/p above surgery resulting in functional limitations due to the deficits listed below (see PT Problem List). Currently needing 2 person assist for transfers OOB to recliner; Very nervous about pain, but still participating, and I agree that CIR for post-acute rehab will be helpful to maximize independence and safety with mobility, and facilitate return home with assist from sister;  Await AKA, likely some time this week; Patient will benefit from skilled PT to increase their independence and safety with mobility to allow discharge to the venue listed below.       Follow Up Recommendations CIR    Equipment Recommendations  Other (comment)(consider sliding board)    Recommendations for Other Services Rehab consult     Precautions / Restrictions Precautions Precautions: Fall Precaution Comments: L BKA, extremely painful Restrictions LLE Weight Bearing: Non weight bearing      Mobility  Bed Mobility Overal bed mobility: Needs Assistance Bed Mobility: Supine to Sit     Supine to sit: Mod assist;+2 for physical assistance     General bed mobility comments: Moved very slowly with Mod assist of 2 throughout the process of getting to EOB; good use of rail to help pull self to the EOB  Transfers Overall transfer level: Needs assistance Equipment used: 2 person hand held assist(and 3rd person in the wedge) Transfers: Squat Pivot Transfers     Squat pivot transfers: Max assist     General transfer comment: Blocked knee on R for  stability; cues for hand placement, and to reach for far armrest; pt very nervous about pain   Ambulation/Gait                Stairs            Wheelchair Mobility    Modified Rankin (Stroke Patients Only)       Balance     Sitting balance-Leahy Scale: Fair                                       Pertinent Vitals/Pain Pain Assessment: Faces Faces Pain Scale: Hurts whole lot Pain Location: L LE Pain Descriptors / Indicators: Operative site guarding;Sharp Pain Intervention(s): Monitored during session;Premedicated before session;Repositioned    Home Living Family/patient expects to be discharged to:: Private residence Living Arrangements: Other relatives Available Help at Discharge: Family;Available 24 hours/day Type of Home: Apartment Home Access: Level entry     Home Layout: One level Home Equipment: Walker - 4 wheels;Wheelchair - manual;Shower seat      Prior Function Level of Independence: Needs assistance   Gait / Transfers Assistance Needed: Occasional assist from sister for squat pivot transfers but is mod indep majority of time.  ADL's / Homemaking Assistance Needed: Pt dresses herself. Reliant on assist from sister for bathing and other ADLs        Hand Dominance   Dominant Hand: Right    Extremity/Trunk Assessment   Upper Extremity Assessment Upper Extremity Assessment: Defer to OT  evaluation    Lower Extremity Assessment Lower Extremity Assessment: Generalized weakness;LLE deficits/detail RLE Sensation: history of peripheral neuropathy LLE Deficits / Details: s/p L BKA revision; L hip flexion 3/5; knee ext 2/5 LLE: Unable to fully assess due to pain       Communication   Communication: No difficulties  Cognition Arousal/Alertness: Awake/alert Behavior During Therapy: WFL for tasks assessed/performed Overall Cognitive Status: Within Functional Limits for tasks assessed                                         General Comments      Exercises     Assessment/Plan    PT Assessment Patient needs continued PT services  PT Problem List Decreased strength;Decreased activity tolerance;Decreased balance;Decreased mobility;Decreased knowledge of use of DME;Decreased knowledge of precautions;Impaired sensation;Obesity;Decreased skin integrity;Pain       PT Treatment Interventions DME instruction;Gait training;Functional mobility training;Therapeutic activities;Therapeutic exercise;Balance training;Patient/family education;Wheelchair mobility training    PT Goals (Current goals can be found in the Care Plan section)  Acute Rehab PT Goals Patient Stated Goal: to decrease pain PT Goal Formulation: With patient Time For Goal Achievement: 09/08/17 Potential to Achieve Goals: Fair    Frequency Min 3X/week   Barriers to discharge        Co-evaluation               AM-PAC PT "6 Clicks" Daily Activity  Outcome Measure Difficulty turning over in bed (including adjusting bedclothes, sheets and blankets)?: Unable Difficulty moving from lying on back to sitting on the side of the bed? : Unable Difficulty sitting down on and standing up from a chair with arms (e.g., wheelchair, bedside commode, etc,.)?: Unable Help needed moving to and from a bed to chair (including a wheelchair)?: A Lot Help needed walking in hospital room?: Total Help needed climbing 3-5 steps with a railing? : Total 6 Click Score: 7    End of Session Equipment Utilized During Treatment: Gait belt Activity Tolerance: Patient tolerated treatment well Patient left: in chair;with call bell/phone within reach;with chair alarm set Nurse Communication: Mobility status PT Visit Diagnosis: Other abnormalities of gait and mobility (R26.89);Muscle weakness (generalized) (M62.81);Pain Pain - Right/Left: Left Pain - part of body: Leg    Time: 1610-96041047-1115 PT Time Calculation (min) (ACUTE ONLY): 28 min   Charges:   PT  Evaluation $PT Eval Moderate Complexity: 1 Mod PT Treatments $Therapeutic Activity: 8-22 mins   PT G Codes:        Danielle Buchanan, PT  Acute Rehabilitation Services Pager [phone] Office [phone]   Danielle AlandHolly H Mead Buchanan 08/25/2017, 3:04 PM

## 2017-08-25 NOTE — Progress Notes (Signed)
Inpatient Rehabilitation  Note plans for a Lt AKA and plan to follow up for post surgical therapy recommendations.  Call if questions.   Charlane FerrettiMelissa Basem Yannuzzi, M.A., CCC/SLP Admission Coordinator  [facility]  Cell [phone]

## 2017-08-25 NOTE — Progress Notes (Signed)
Dressing changes done for left BKA and right foot at 0345 this morning.

## 2017-08-26 LAB — GLUCOSE, CAPILLARY
GLUCOSE-CAPILLARY: 113 mg/dL — AB (ref 65–99)
Glucose-Capillary: 157 mg/dL — ABNORMAL HIGH (ref 65–99)
Glucose-Capillary: 221 mg/dL — ABNORMAL HIGH (ref 65–99)
Glucose-Capillary: 212 mg/dL — ABNORMAL HIGH (ref 65–99)

## 2017-08-26 LAB — SURGICAL PCR SCREEN
MRSA, PCR: NEGATIVE
STAPHYLOCOCCUS AUREUS: NEGATIVE

## 2017-08-26 NOTE — Progress Notes (Signed)
  Progress Note    08/26/2017 2:29 PM * No surgery date entered *  Subjective: no acute issues  Vitals:   08/25/17 2056 08/26/17 0536  BP: (!) 144/61 137/68  Pulse: 77 73  Resp: 15 15  Temp: 98.7 F (37.1 C) 98.5 F (36.9 C)  SpO2: 95% 97%    Physical Exam: aaox3 Dressing on left bka limb is cdi  CBC    Component Value Date/Time   WBC 18.3 (H) 08/23/2017 1326   RBC 3.57 (L) 08/23/2017 1326   HGB 9.6 (L) 08/23/2017 1326   HCT 30.7 (L) 08/23/2017 1326   PLT 564 (H) 08/23/2017 1326   MCV 86.0 08/23/2017 1326   MCH 26.9 08/23/2017 1326   MCHC 31.3 08/23/2017 1326   RDW 14.6 08/23/2017 1326   LYMPHSABS 2.6 08/23/2017 1326   MONOABS 0.9 08/23/2017 1326   EOSABS 0.2 08/23/2017 1326   BASOSABS 0.2 (H) 08/23/2017 1326    BMET    Component Value Date/Time   NA 134 (L) 08/23/2017 1326   K 3.3 (L) 08/23/2017 1326   CL 100 (L) 08/23/2017 1326   CO2 25 08/23/2017 1326   GLUCOSE 61 (L) 08/23/2017 1326   BUN 8 08/23/2017 1326   CREATININE 0.59 08/23/2017 1326   CALCIUM 8.6 (L) 08/23/2017 1326   GFRNONAA >60 08/23/2017 1326   GFRAA >60 08/23/2017 1326    INR    Component Value Date/Time   INR 1.16 08/23/2017 1326     Intake/Output Summary (Last 24 hours) at 08/26/2017 1429 Last data filed at 08/26/2017 1235 Gross per 24 hour  Intake 953.83 ml  Output 700 ml  Net 253.83 ml     Assessment:  61 y.o. female is s/p revision of left bka with wound breakdown  Plan: OR tomorrow for revision to left aka Npo past midnight   Cherity Blickenstaff C. Randie Heinzain, MD [facility] [place] Office: [phone] Pager: [phone]  08/26/2017 2:29 PM

## 2017-08-26 NOTE — Anesthesia Preprocedure Evaluation (Addendum)
Anesthesia Evaluation  Patient identified by MRN, date of birth, ID band Patient awake    Reviewed: Allergy & Precautions, H&P , NPO status , Patient's Chart, lab work & pertinent test results  Airway Mallampati: III  TM Distance: >3 FB Neck ROM: Full    Dental no notable dental hx. (+) Teeth Intact, Dental Advisory Given   Pulmonary sleep apnea , former smoker,    Pulmonary exam normal breath sounds clear to auscultation       Cardiovascular Exercise Tolerance: Good hypertension, Pt. on medications + Peripheral Vascular Disease   Rhythm:Regular Rate:Normal     Neuro/Psych Depression negative neurological ROS  negative psych ROS   GI/Hepatic negative GI ROS, Neg liver ROS,   Endo/Other  diabetes, Insulin DependentMorbid obesity  Renal/GU negative Renal ROS  negative genitourinary   Musculoskeletal   Abdominal   Peds  Hematology negative hematology ROS (+)   Anesthesia Other Findings   Reproductive/Obstetrics negative OB ROS                            Anesthesia Physical Anesthesia Plan  ASA: III  Anesthesia Plan: General   Post-op Pain Management:    Induction: Intravenous  PONV Risk Score and Plan: 4 or greater and Ondansetron, Dexamethasone, Midazolam and Diphenhydramine  Airway Management Planned: Oral ETT  Additional Equipment:   Intra-op Plan:   Post-operative Plan: Extubation in OR  Informed Consent: I have reviewed the patients History and Physical, chart, labs and discussed the procedure including the risks, benefits and alternatives for the proposed anesthesia with the patient or authorized representative who has indicated his/her understanding and acceptance.   Dental advisory given  Plan Discussed with: CRNA  Anesthesia Plan Comments:         Anesthesia Quick Evaluation

## 2017-08-27 ENCOUNTER — Encounter (HOSPITAL_COMMUNITY): Payer: Self-pay | Admitting: Certified Registered Nurse Anesthetist

## 2017-08-27 ENCOUNTER — Encounter (HOSPITAL_COMMUNITY)
Admission: AD | Disposition: A | Discharge status: Home Health | Payer: Self-pay | Source: Ambulatory Visit | Attending: Vascular Surgery

## 2017-08-27 ENCOUNTER — Inpatient Hospital Stay (HOSPITAL_COMMUNITY): Payer: Medicare HMO | Admitting: Certified Registered Nurse Anesthetist

## 2017-08-27 DIAGNOSIS — T8781 Dehiscence of amputation stump: Secondary | ICD-10-CM

## 2017-08-27 HISTORY — PX: APPLICATION OF WOUND VAC: SHX5189

## 2017-08-27 HISTORY — PX: AMPUTATION: SHX166

## 2017-08-27 LAB — GLUCOSE, CAPILLARY
GLUCOSE-CAPILLARY: 128 mg/dL — AB (ref 65–99)
GLUCOSE-CAPILLARY: 309 mg/dL — AB (ref 65–99)
Glucose-Capillary: 126 mg/dL — ABNORMAL HIGH (ref 65–99)
Glucose-Capillary: 240 mg/dL — ABNORMAL HIGH (ref 65–99)
Glucose-Capillary: 204 mg/dL — ABNORMAL HIGH (ref 65–99)

## 2017-08-27 LAB — URINALYSIS, ROUTINE W REFLEX MICROSCOPIC
Bilirubin Urine: NEGATIVE
Glucose, UA: NEGATIVE mg/dL
Hgb urine dipstick: NEGATIVE
Ketones, ur: NEGATIVE mg/dL
Leukocytes, UA: NEGATIVE
NITRITE: NEGATIVE
Protein, ur: NEGATIVE mg/dL
SPECIFIC GRAVITY, URINE: 1.011 (ref 1.005–1.030)
pH: 6 (ref 5.0–8.0)

## 2017-08-27 LAB — CBC
HCT: 29.7 % — ABNORMAL LOW (ref 36.0–46.0)
Hemoglobin: 9 g/dL — ABNORMAL LOW (ref 12.0–15.0)
MCH: 26.5 pg (ref 26.0–34.0)
MCHC: 30.3 g/dL (ref 30.0–36.0)
MCV: 87.4 fL (ref 78.0–100.0)
PLATELETS: 462 10*3/uL — AB (ref 150–400)
RBC: 3.4 MIL/uL — ABNORMAL LOW (ref 3.87–5.11)
RDW: 15.2 % (ref 11.5–15.5)
WBC: 11.1 10*3/uL — AB (ref 4.0–10.5)

## 2017-08-27 LAB — BASIC METABOLIC PANEL
Anion gap: 7 (ref 5–15)
BUN: 10 mg/dL (ref 6–20)
CALCIUM: 9 mg/dL (ref 8.9–10.3)
CO2: 29 mmol/L (ref 22–32)
CREATININE: 0.71 mg/dL (ref 0.44–1.00)
Chloride: 99 mmol/L — ABNORMAL LOW (ref 101–111)
GFR calc Af Amer: >60 mL/min (ref >60)
Glucose, Bld: 133 mg/dL — ABNORMAL HIGH (ref 65–99)
Potassium: 5 mmol/L (ref 3.5–5.1)
Sodium: 135 mmol/L (ref 135–145)

## 2017-08-27 SURGERY — AMPUTATION, ABOVE KNEE
Anesthesia: General | Site: Leg Upper | Laterality: Left

## 2017-08-27 MED ORDER — MIDAZOLAM HCL 2 MG/2ML IJ SOLN
INTRAMUSCULAR | Status: AC
  Filled 2017-08-27: qty 2

## 2017-08-27 MED ORDER — HYDROMORPHONE HCL 1 MG/ML IJ SOLN: 0.2500 mg | INTRAMUSCULAR | Status: DC | PRN

## 2017-08-27 MED ORDER — DIPHENHYDRAMINE HCL 50 MG/ML IJ SOLN
INTRAMUSCULAR | Status: DC | PRN
  Administered 2017-08-27: 12.5 mg via INTRAVENOUS

## 2017-08-27 MED ORDER — ONDANSETRON HCL 4 MG/2ML IJ SOLN
INTRAMUSCULAR | Status: AC
  Filled 2017-08-27: qty 2

## 2017-08-27 MED ORDER — MIDAZOLAM HCL 5 MG/5ML IJ SOLN
INTRAMUSCULAR | Status: DC | PRN
  Administered 2017-08-27: 2 mg via INTRAVENOUS

## 2017-08-27 MED ORDER — FENTANYL CITRATE (PF) 250 MCG/5ML IJ SOLN
INTRAMUSCULAR | Status: AC
  Filled 2017-08-27: qty 5

## 2017-08-27 MED ORDER — LIDOCAINE 2% (20 MG/ML) 5 ML SYRINGE
INTRAMUSCULAR | Status: DC | PRN
Start: 2017-08-27 — End: 2017-08-27
  Administered 2017-08-27: 60 mg via INTRAVENOUS

## 2017-08-27 MED ORDER — ONDANSETRON HCL 4 MG/2ML IJ SOLN
INTRAMUSCULAR | Status: DC | PRN
  Administered 2017-08-27: 4 mg via INTRAVENOUS

## 2017-08-27 MED ORDER — PHENYLEPHRINE HCL 10 MG/ML IJ SOLN
INTRAVENOUS | Status: DC | PRN
  Administered 2017-08-27: 15 ug/min via INTRAVENOUS

## 2017-08-27 MED ORDER — GABAPENTIN 300 MG PO CAPS
600.0000 mg | ORAL_CAPSULE | Freq: Once | ORAL | Status: AC
  Administered 2017-08-27: 600 mg via ORAL
  Filled 2017-08-27: qty 2

## 2017-08-27 MED ORDER — GABAPENTIN 300 MG PO CAPS
ORAL_CAPSULE | ORAL | Status: AC
  Administered 2017-08-27: 600 mg via ORAL
  Filled 2017-08-27: qty 2

## 2017-08-27 MED ORDER — LACTATED RINGERS IV SOLN
INTRAVENOUS | Status: DC | PRN
  Administered 2017-08-27 (×2): via INTRAVENOUS

## 2017-08-27 MED ORDER — DIPHENHYDRAMINE HCL 50 MG/ML IJ SOLN
INTRAMUSCULAR | Status: AC
  Filled 2017-08-27: qty 1

## 2017-08-27 MED ORDER — SODIUM CHLORIDE 0.9 % IV SOLN
INTRAVENOUS | Status: DC | PRN
  Administered 2017-08-27 (×2): 500 mL

## 2017-08-27 MED ORDER — SUGAMMADEX SODIUM 200 MG/2ML IV SOLN
INTRAVENOUS | Status: DC | PRN
  Administered 2017-08-27: 200 mg via INTRAVENOUS

## 2017-08-27 MED ORDER — KETAMINE HCL 10 MG/ML IJ SOLN
INTRAMUSCULAR | Status: DC | PRN
  Administered 2017-08-27: 40 mg via INTRAVENOUS

## 2017-08-27 MED ORDER — 0.9 % SODIUM CHLORIDE (POUR BTL) OPTIME
TOPICAL | Status: DC | PRN
  Administered 2017-08-27: 1000 mL

## 2017-08-27 MED ORDER — DEXAMETHASONE SODIUM PHOSPHATE 10 MG/ML IJ SOLN
INTRAMUSCULAR | Status: AC
  Filled 2017-08-27: qty 1

## 2017-08-27 MED ORDER — PHENYLEPHRINE 40 MCG/ML (10ML) SYRINGE FOR IV PUSH (FOR BLOOD PRESSURE SUPPORT)
PREFILLED_SYRINGE | INTRAVENOUS | Status: AC
  Filled 2017-08-27: qty 10

## 2017-08-27 MED ORDER — ACETAMINOPHEN 500 MG PO TABS
ORAL_TABLET | ORAL | Status: AC
  Filled 2017-08-27: qty 2

## 2017-08-27 MED ORDER — PROPOFOL 10 MG/ML IV BOLUS
INTRAVENOUS | Status: DC | PRN
  Administered 2017-08-27: 150 mg via INTRAVENOUS

## 2017-08-27 MED ORDER — PROPOFOL 10 MG/ML IV BOLUS
INTRAVENOUS | Status: AC
  Filled 2017-08-27: qty 40

## 2017-08-27 MED ORDER — ACETAMINOPHEN 500 MG PO TABS
1000.0000 mg | ORAL_TABLET | Freq: Once | ORAL | Status: AC
  Administered 2017-08-27: 1000 mg via ORAL
  Filled 2017-08-27: qty 2

## 2017-08-27 MED ORDER — DEXAMETHASONE SODIUM PHOSPHATE 10 MG/ML IJ SOLN
INTRAMUSCULAR | Status: DC | PRN
  Administered 2017-08-27: 5 mg via INTRAVENOUS

## 2017-08-27 MED ORDER — FENTANYL CITRATE (PF) 100 MCG/2ML IJ SOLN
INTRAMUSCULAR | Status: DC | PRN
  Administered 2017-08-27 (×5): 50 ug via INTRAVENOUS

## 2017-08-27 MED ORDER — KETAMINE HCL-SODIUM CHLORIDE 100-0.9 MG/10ML-% IV SOSY
PREFILLED_SYRINGE | INTRAVENOUS | Status: AC
  Filled 2017-08-27: qty 10

## 2017-08-27 MED ORDER — ROCURONIUM BROMIDE 10 MG/ML (PF) SYRINGE
PREFILLED_SYRINGE | INTRAVENOUS | Status: DC | PRN
  Administered 2017-08-27: 50 mg via INTRAVENOUS

## 2017-08-27 MED ORDER — PHENYLEPHRINE 40 MCG/ML (10ML) SYRINGE FOR IV PUSH (FOR BLOOD PRESSURE SUPPORT)
PREFILLED_SYRINGE | INTRAVENOUS | Status: DC | PRN
Start: 2017-08-27 — End: 2017-08-27
  Administered 2017-08-27 (×2): 80 ug via INTRAVENOUS

## 2017-08-27 MED ORDER — LIDOCAINE 2% (20 MG/ML) 5 ML SYRINGE
INTRAMUSCULAR | Status: AC
  Filled 2017-08-27: qty 5

## 2017-08-27 MED ORDER — ROCURONIUM BROMIDE 10 MG/ML (PF) SYRINGE
PREFILLED_SYRINGE | INTRAVENOUS | Status: AC
  Filled 2017-08-27: qty 5

## 2017-08-27 SURGICAL SUPPLY — 46 items
BANDAGE ACE 4X5 VEL STRL LF (GAUZE/BANDAGES/DRESSINGS) ×3 IMPLANT
BANDAGE ACE 6X5 VEL STRL LF (GAUZE/BANDAGES/DRESSINGS) ×3 IMPLANT
BLADE OSCILLATING /SAGITTAL (BLADE) ×3 IMPLANT
BLADE SAW GIGLI 510 (BLADE) ×2 IMPLANT
BLADE SAW GIGLI 510MM (BLADE) ×1
BLADE SAW RECIP 87.9 MT (BLADE) ×3 IMPLANT
BNDG COHESIVE 6X5 TAN STRL LF (GAUZE/BANDAGES/DRESSINGS) ×3 IMPLANT
BNDG GAUZE ELAST 4 BULKY (GAUZE/BANDAGES/DRESSINGS) ×3 IMPLANT
CANISTER SUCT 3000ML PPV (MISCELLANEOUS) ×3 IMPLANT
CLIP VESOCCLUDE MED 6/CT (CLIP) ×3 IMPLANT
COVER SURGICAL LIGHT HANDLE (MISCELLANEOUS) ×3 IMPLANT
DRAIN CHANNEL 19F RND (DRAIN) IMPLANT
DRAPE HALF SHEET 40X57 (DRAPES) ×3 IMPLANT
DRAPE INCISE IOBAN 66X45 STRL (DRAPES) ×3 IMPLANT
DRAPE ORTHO SPLIT 77X108 STRL (DRAPES) ×4
DRAPE SURG ORHT 6 SPLT 77X108 (DRAPES) ×2 IMPLANT
DRSG ADAPTIC 3X8 NADH LF (GAUZE/BANDAGES/DRESSINGS) ×3 IMPLANT
DRSG VAC ATS MED SENSATRAC (GAUZE/BANDAGES/DRESSINGS) ×3 IMPLANT
ELECT CAUTERY BLADE 6.4 (BLADE) ×3 IMPLANT
ELECT REM PT RETURN 9FT ADLT (ELECTROSURGICAL) ×3
ELECTRODE REM PT RTRN 9FT ADLT (ELECTROSURGICAL) ×1 IMPLANT
EVACUATOR SILICONE 100CC (DRAIN) IMPLANT
GAUZE SPONGE 4X4 12PLY STRL (GAUZE/BANDAGES/DRESSINGS) ×3 IMPLANT
GLOVE BIO SURGEON STRL SZ7.5 (GLOVE) ×3 IMPLANT
GOWN STRL REUS W/ TWL LRG LVL3 (GOWN DISPOSABLE) ×2 IMPLANT
GOWN STRL REUS W/ TWL XL LVL3 (GOWN DISPOSABLE) ×1 IMPLANT
GOWN STRL REUS W/TWL LRG LVL3 (GOWN DISPOSABLE) ×4
GOWN STRL REUS W/TWL XL LVL3 (GOWN DISPOSABLE) ×2
KIT BASIN OR (CUSTOM PROCEDURE TRAY) ×3 IMPLANT
KIT ROOM TURNOVER OR (KITS) ×3 IMPLANT
NS IRRIG 1000ML POUR BTL (IV SOLUTION) ×3 IMPLANT
PACK GENERAL/GYN (CUSTOM PROCEDURE TRAY) ×3 IMPLANT
PAD ARMBOARD 7.5X6 YLW CONV (MISCELLANEOUS) ×6 IMPLANT
SPONGE LAP 18X18 X RAY DECT (DISPOSABLE) ×3 IMPLANT
STAPLER VISISTAT 35W (STAPLE) ×3 IMPLANT
STOCKINETTE IMPERVIOUS LG (DRAPES) ×3 IMPLANT
SUT ETHILON 3 0 PS 1 (SUTURE) IMPLANT
SUT SILK 0 TIES 10X30 (SUTURE) ×3 IMPLANT
SUT SILK 2 0 (SUTURE) ×2
SUT SILK 2-0 18XBRD TIE 12 (SUTURE) ×1 IMPLANT
SUT SILK 3 0 (SUTURE)
SUT SILK 3-0 18XBRD TIE 12 (SUTURE) IMPLANT
SUT VIC AB 2-0 CT1 18 (SUTURE) ×6 IMPLANT
TOWEL GREEN STERILE (TOWEL DISPOSABLE) ×3 IMPLANT
UNDERPAD 30X30 (UNDERPADS AND DIAPERS) ×3 IMPLANT
WATER STERILE IRR 1000ML POUR (IV SOLUTION) ×3 IMPLANT

## 2017-08-27 NOTE — Progress Notes (Signed)
  Progress Note    08/27/2017 6:41 AM Day of Surgery  Subjective:  No acute issues  Vitals:   08/26/17 2100 08/27/17 0500  BP: (!) 145/60 (!) 142/69  Pulse: 83 75  Resp: 18 18  Temp: 99.4 F (37.4 C) 99 F (37.2 C)  SpO2: 98% 96%    Physical Exam: aaox3 Non labored respirations Abdomen is soft Left bka dressing cdi  CBC    Component Value Date/Time   WBC 18.3 (H) 08/23/2017 1326   RBC 3.57 (L) 08/23/2017 1326   HGB 9.6 (L) 08/23/2017 1326   HCT 30.7 (L) 08/23/2017 1326   PLT 564 (H) 08/23/2017 1326   MCV 86.0 08/23/2017 1326   MCH 26.9 08/23/2017 1326   MCHC 31.3 08/23/2017 1326   RDW 14.6 08/23/2017 1326   LYMPHSABS 2.6 08/23/2017 1326   MONOABS 0.9 08/23/2017 1326   EOSABS 0.2 08/23/2017 1326   BASOSABS 0.2 (H) 08/23/2017 1326    BMET    Component Value Date/Time   NA 134 (L) 08/23/2017 1326   K 3.3 (L) 08/23/2017 1326   CL 100 (L) 08/23/2017 1326   CO2 25 08/23/2017 1326   GLUCOSE 61 (L) 08/23/2017 1326   BUN 8 08/23/2017 1326   CREATININE 0.59 08/23/2017 1326   CALCIUM 8.6 (L) 08/23/2017 1326   GFRNONAA >60 08/23/2017 1326   GFRAA >60 08/23/2017 1326    INR    Component Value Date/Time   INR 1.16 08/23/2017 1326     Intake/Output Summary (Last 24 hours) at 08/27/2017 0641 Last data filed at 08/27/2017 0409 Gross per 24 hour  Intake 1606.17 ml  Output 2700 ml  Net -1093.83 ml     Assessment:  61 y.o. female is s/p left bka with breakdown  Plan: OR today for conversion to aka.    Terrell Shimko C. Randie Heinzain, MD [facility] [place] Office: [phone] Pager: [phone]  08/27/2017 6:41 AM

## 2017-08-27 NOTE — Care Management Note (Addendum)
Case Management Note  Patient Details  Name: Josefine ClassJanice Bowdoin MRN: [id number] Date of Birth: 22-Apr-1956  Subjective/Objective:                    Action/Plan: Done benefits check with [employer], patient's insurance is NOT in Network with [employer], therefore unable to provide home [facility] VAC at discharge if needed. [facility] has negative pressure wound system if needed at discharge.  Patient active with [facility] prior to admission. PT recommending CIR prior to surgery . Will await post surgery PT evaluation.  [facility] , [facility] . A lot [employer] plans not in network with [employer].  [facility] has negative wound pressure system.  Expected Discharge Date:                  Expected Discharge Plan:  IP Rehab Facility  In-House Referral:  Clinical Social Work  Discharge planning Services  CM Consult  Post Acute Care Choice:    Choice offered to:     DME Arranged:    DME Agency:     HH Arranged:    HH Agency:     Status of Service:  In process, will continue to follow  If discussed at Long Length of Stay Meetings, dates discussed:    Additional Comments:  Kingsley PlanWile, Jacques Willingham Marie, RN 08/27/2017, 11:16 AM

## 2017-08-27 NOTE — Consult Note (Signed)
   [facility]   08/27/2017  Danielle ClassJanice Sisley December 02, 1955 [id number]   Patient assessed for re-admission and needs.  Chart review reveals [facility].  Admitted with increased drainage and pain in her left below the knee amputation.  Chart reveals patient is post op today.  Will follow for disposition and needs as appropriate. Not sure of disposition, PT is recommending CIR. Will follow.  Charlesetta ShanksVictoria Joely Losier, RN BSN CCM [facility]  [phone] business mobile phone Toll free office [phone]

## 2017-08-27 NOTE — Transfer of Care (Signed)
Immediate Anesthesia Transfer of Care Note  Patient: Danielle Buchanan  Procedure(s) Performed: left ABOVE KNEE AMPUTATION (Left Leg Upper) APPLICATION OF WOUND VAC (Left Leg Upper)  Patient Location: PACU  Anesthesia Type:General  Level of Consciousness: drowsy and patient cooperative  Airway & Oxygen Therapy: Patient Spontanous Breathing and Patient connected to nasal cannula oxygen  Post-op Assessment: Report given to RN and Post -op Vital signs reviewed and stable  Post vital signs: Reviewed and stable  Last Vitals:  Vitals:   08/27/17 0500 08/27/17 0849  BP: (!) 142/69 122/66  Pulse: 75 76  Resp: 18 16  Temp: 37.2 C 36.8 C  SpO2: 96% 95%    Last Pain:  Vitals:   08/27/17 0500  TempSrc: Oral  PainSc:       Patients Stated Pain Goal: 3 (08/25/17 0451)  Complications: No apparent anesthesia complications

## 2017-08-27 NOTE — Progress Notes (Signed)
Nutrition Follow-up  DOCUMENTATION CODES:   Obesity unspecified  INTERVENTION:   -Continue Premier Protein TID, each supplement provides 160 kcals and 30 grams protein -Continue MVI daily  NUTRITION DIAGNOSIS:   Increased nutrient needs related to wound healing as evidenced by estimated needs.  Ongoing  GOAL:   Patient will meet greater than or equal to 90% of their needs  Progressing  MONITOR:   PO intake, Supplement acceptance, Labs, Weight trends, Skin, I & O's  REASON FOR ASSESSMENT:   Malnutrition Screening Tool    ASSESSMENT:   Danielle Buchanan is a 61 y.o. female presents in follow-up from recent left revision of her left below-knee amputation.  She now has had significant drainage and pain.  She does not have any fevers or chills.  She is not on antibiotics.  She is followed by wound care for her right fourth and fifth toe ulceration.  11/20- s/p Left above knee amputation with wound vac placement  Pt very sleeping; just recently returned from surgery at time of visit.  Meal completion improved per chart review; noted PO 75-100%. Pt has also been consuming Premier Protein supplements per MAR.   Labs reviewed: CBGS: 128-240 (inpatient orders for glycemic control are 0-15 units insulin aspart TID with meals and 70 units insulin aspart protamine-aspart BID).   Diet Order:  Diet Carb Modified Fluid consistency: Thin; Room service appropriate? Yes  EDUCATION NEEDS:   Not appropriate for education at this time  Skin:  Skin Assessment: Skin Integrity Issues: Skin Integrity Issues:: Diabetic Ulcer, Wound VAC Wound Vac: rt AKA Diabetic Ulcer: rt foot Incisions: lt BKA site  Last BM:  08/26/17  Height:   Ht Readings from Last 1 Encounters:  08/23/17 5\' 5"  (1.651 m)    Weight:   Wt Readings from Last 1 Encounters:  08/23/17 208 lb 12.4 oz (94.7 kg)    Ideal Body Weight:  52.2 kg  BMI:  Body mass index is 34.74 kg/m.  Estimated Nutritional Needs:    Kcal:  1600-1800  Protein:  105-120 grams  Fluid:  > 1.6 L    Danielle Buchanan, RD, LDN, CDE Pager: [phone] After hours Pager: [phone]

## 2017-08-27 NOTE — Anesthesia Procedure Notes (Signed)
Procedure Name: Intubation Date/Time: 08/27/2017 7:36 AM Performed by: Waynard EdwardsSmith, Caillou Minus A, CRNA Pre-anesthesia Checklist: Patient identified, Emergency Drugs available, Suction available and Patient being monitored Patient Re-evaluated:Patient Re-evaluated prior to induction Oxygen Delivery Method: Circle system utilized Preoxygenation: Pre-oxygenation with 100% oxygen Induction Type: IV induction Ventilation: Mask ventilation without difficulty and Oral airway inserted - appropriate to patient size Laryngoscope Size: Hyacinth MeekerMiller and 2 Grade View: Grade I Tube type: Oral Tube size: 7.0 mm Number of attempts: 1 Airway Equipment and Method: Stylet Placement Confirmation: ETT inserted through vocal cords under direct vision,  positive ETCO2 and breath sounds checked- equal and bilateral Secured at: 22 cm Tube secured with: Tape Dental Injury: Teeth and Oropharynx as per pre-operative assessment

## 2017-08-27 NOTE — Anesthesia Postprocedure Evaluation (Signed)
Anesthesia Post Note  Patient: Danielle Buchanan  Procedure(s) Performed: left ABOVE KNEE AMPUTATION (Left Leg Upper) APPLICATION OF WOUND VAC (Left Leg Upper)     Patient location during evaluation: PACU Anesthesia Type: General Level of consciousness: awake and alert Pain management: pain level controlled Vital Signs Assessment: post-procedure vital signs reviewed and stable Respiratory status: spontaneous breathing, nonlabored ventilation, respiratory function stable and patient connected to nasal cannula oxygen Cardiovascular status: blood pressure returned to baseline and stable Postop Assessment: no apparent nausea or vomiting Anesthetic complications: no    Last Vitals:  Vitals:   08/27/17 0900 08/27/17 0915  BP: 110/70 121/83  Pulse: 77 80  Resp: 15 16  Temp:    SpO2: 95% 96%    Last Pain:  Vitals:   08/27/17 0915  TempSrc:   PainSc: 0-No pain                 Draxton Luu,W. EDMOND

## 2017-08-27 NOTE — Progress Notes (Signed)
Pt transfer from OR s/p left AKA with wound vac, alert and oriented.

## 2017-08-27 NOTE — Op Note (Signed)
    Patient name: Josefine ClassJanice Compere MRN: [id number] DOB: 02/18/1956 Sex: female  08/27/2017 Pre-operative Diagnosis: left below knee amputation site infection Post-operative diagnosis:  Same Surgeon:  Luanna SalkBrandon C. Randie Heinzain, MD Assistant: Lianne CureMaureen Collins, PA Procedure Performed: Left above knee amputation with wound vac placement  Indications:  61 year old female has undergone below-knee amputation on the left. She had breakdown of this wound after fall and underwent revision to below-knee amputation and now has purulent drainage. She is now indicated for above knee amputation.  Findings: the underlying muscle was healthy and there is bleeding from the superficial femoral artery was clamped. There was unfortunately pus tracking posteriorly up the thigh and we could not revise the above-knee amputation to exclude the entire cavity. the wound was copiously irrigated and the bone was transected several centimeters higher than the initial transection point. A wound VAC was placed and she will need to be taken back to the operating room in 48-72 hours for evaluation of the wound.   Procedure:  The patient was identified in the holding area and taken to the operating room placed supine on the operating table and general anesthesia was induced. She was sterilely prepped and draped the left leg from a usual fashion timeout called. We began by marking an above-knee amputation just above the level the knee fishmouth incision. We then traces incision with a 10 blade carried down to the level of the bone where there was adequate bleeding in the tissue that was controlled with electrocautery. Then clamped our blood vessels in the bilateral bone. Within viral blood vessels and unfortunately encountered pus posterior flap. The posterior flap was created with amputation knife. We then identified the path posteriorly up the thigh and we irrigated the pus copiously initially with saline followed by bug juice. Realizing that the  cavity tract was very high thought that I could not take the amputation site to exclude distract. I did transect about an extra 5 cm higher and also the nerve was tied off. We again copiously irrigated away and obtain hemostasis. Elected not close the wound given the amount of pus that was drained and a medium wound VAC sponge was trimmed to size and fashioned to the fishmouth incision. And an [place] was used to fix the sponge and the suction port was placed attached to suction. Patient did tolerate this procedure well without immediate complication. WERE correct at completion. Next  EBL 200 mL.   Abb Gobert C. Randie Heinzain, MD [facility] [place] Office: [phone] Pager: [phone]

## 2017-08-28 ENCOUNTER — Encounter (HOSPITAL_COMMUNITY): Payer: Self-pay | Admitting: Vascular Surgery

## 2017-08-28 LAB — BASIC METABOLIC PANEL
ANION GAP: 11 (ref 5–15)
BUN: 15 mg/dL (ref 6–20)
CO2: 24 mmol/L (ref 22–32)
Calcium: 8.7 mg/dL — ABNORMAL LOW (ref 8.9–10.3)
Chloride: 96 mmol/L — ABNORMAL LOW (ref 101–111)
Creatinine, Ser: 0.74 mg/dL (ref 0.44–1.00)
GFR calc Af Amer: >60 mL/min (ref >60)
GFR calc non Af Amer: >60 mL/min (ref >60)
GLUCOSE: 256 mg/dL — AB (ref 65–99)
POTASSIUM: 4.5 mmol/L (ref 3.5–5.1)
Sodium: 131 mmol/L — ABNORMAL LOW (ref 135–145)

## 2017-08-28 LAB — GLUCOSE, CAPILLARY
GLUCOSE-CAPILLARY: 134 mg/dL — AB (ref 65–99)
GLUCOSE-CAPILLARY: 127 mg/dL — AB (ref 65–99)
Glucose-Capillary: 262 mg/dL — ABNORMAL HIGH (ref 65–99)
Glucose-Capillary: 173 mg/dL — ABNORMAL HIGH (ref 65–99)

## 2017-08-28 MED ORDER — DIPHENHYDRAMINE HCL 12.5 MG/5ML PO ELIX: 12.5000 mg | ORAL_SOLUTION | Freq: Four times a day (QID) | ORAL | Status: DC | PRN

## 2017-08-28 MED ORDER — SODIUM CHLORIDE 0.9% FLUSH: 9.0000 mL | INTRAVENOUS | Status: DC | PRN

## 2017-08-28 MED ORDER — MORPHINE SULFATE 2 MG/ML IV SOLN
INTRAVENOUS | Status: DC
Start: 2017-08-28 — End: 2017-09-02
  Administered 2017-08-28: 2 mg via INTRAVENOUS
  Administered 2017-08-28 (×2): 3 mg via INTRAVENOUS
  Administered 2017-08-29: 2 mg via INTRAVENOUS
  Administered 2017-08-29: 5 mg via INTRAVENOUS
  Administered 2017-08-29: 6 mg via INTRAVENOUS
  Administered 2017-08-29: 1 mg via INTRAVENOUS
  Administered 2017-08-30: 0 mg via INTRAVENOUS
  Administered 2017-08-30: 22:00:00 via INTRAVENOUS
  Administered 2017-08-30: 3 mg via INTRAVENOUS
  Administered 2017-08-30: 5 mg via INTRAVENOUS
  Administered 2017-08-30: 3 mg via INTRAVENOUS
  Administered 2017-08-31: 0 mg via INTRAVENOUS
  Administered 2017-08-31: 4 mg via INTRAVENOUS
  Administered 2017-08-31: 1 mg via INTRAVENOUS
  Administered 2017-08-31 (×2): 3 mg via INTRAVENOUS
  Administered 2017-09-01: 0 mg via INTRAVENOUS
  Administered 2017-09-01 (×2): 1 mg via INTRAVENOUS
  Administered 2017-09-01: 2 mg via INTRAVENOUS
  Administered 2017-09-01: 1 mg via INTRAVENOUS
  Filled 2017-08-28: qty 30
  Filled 2017-08-28: qty 60

## 2017-08-28 MED ORDER — NALOXONE HCL 0.4 MG/ML IJ SOLN: 0.4000 mg | INTRAMUSCULAR | Status: DC | PRN

## 2017-08-28 MED ORDER — DIPHENHYDRAMINE HCL 50 MG/ML IJ SOLN: 12.5000 mg | Freq: Four times a day (QID) | INTRAMUSCULAR | Status: DC | PRN

## 2017-08-28 NOTE — Progress Notes (Signed)
  Progress Note    08/28/2017 12:39 PM 1 Day Post-Op  Subjective:  Pain is not controlled  Tm 100.2 now 99.1 (@0500)  Vitals:   08/28/17 0453 08/28/17 0526  BP: 135/62 (!) 144/76  Pulse: 79 84  Resp: 18 18  Temp: 99.5 F (37.5 C) 99.1 F (37.3 C)  SpO2: 94% 93%    Physical Exam: Incisions:  Wound vac with good seal    CBC    Component Value Date/Time   WBC 11.1 (H) 08/27/2017 0601   RBC 3.40 (L) 08/27/2017 0601   HGB 9.0 (L) 08/27/2017 0601   HCT 29.7 (L) 08/27/2017 0601   PLT 462 (H) 08/27/2017 0601   MCV 87.4 08/27/2017 0601   MCH 26.5 08/27/2017 0601   MCHC 30.3 08/27/2017 0601   RDW 15.2 08/27/2017 0601   LYMPHSABS 2.6 08/23/2017 1326   MONOABS 0.9 08/23/2017 1326   EOSABS 0.2 08/23/2017 1326   BASOSABS 0.2 (H) 08/23/2017 1326    BMET    Component Value Date/Time   NA 131 (L) 08/28/2017 0753   K 4.5 08/28/2017 0753   CL 96 (L) 08/28/2017 0753   CO2 24 08/28/2017 0753   GLUCOSE 256 (H) 08/28/2017 0753   BUN 15 08/28/2017 0753   CREATININE 0.74 08/28/2017 0753   CALCIUM 8.7 (L) 08/28/2017 0753   GFRNONAA >60 08/28/2017 0753   GFRAA >60 08/28/2017 0753    INR    Component Value Date/Time   INR 1.16 08/23/2017 1326     Intake/Output Summary (Last 24 hours) at 08/28/2017 1239 Last data filed at 08/28/2017 1148 Gross per 24 hour  Intake 870 ml  Output 1410 ml  Net -540 ml     Assessment/Plan:  61 y.o. female is s/p left above knee amputation  1 Day Post-Op  -wound vac with good seal -pt does not have good pain control-will start PCA -for OR on Friday for washout/revision -continue IV abx   Samantha Rhyne, PA-C [facility] [phone] 08/28/2017 12:39 PM    I agree with the above.  I have seen and evaluated the patient.  We discussed operative plan for Friday.  Wells Brabham       

## 2017-08-28 NOTE — Consult Note (Signed)
   [facility]   08/28/2017  Danielle ClassJanice Nuzum Oct 31, 1955 [id number]  [facility]  Patient currently with pain control issues.  Inpatient RNCM made aware of [facility] following for disposition needs.  Came by and patient was asleep. Did not disturb at this time.  Charlesetta ShanksVictoria Patrisha Hausmann, RN BSN CCM [facility]  [phone] business mobile phone Toll free office [phone]

## 2017-08-28 NOTE — Progress Notes (Signed)
Physical Therapy Treatment Patient Details Name: Danielle Buchanan MRN: [id number] DOB: [birth year] Today's Date: 08/28/2017    History of Present Illness 61 y.o. female presents in follow-up from recent left revision of her left below-knee amputation, had significant drainage and pain, L AKA on 11/20.    PT Comments    Pt is up to side of bed reluctantly but then could assist to stand despite her pain complaints.  Has no tolerance for ROM to L leg but did actively assist with lifting it to place a small lift pad under for elevation.  Has been repositioned in bed, continuing to work toward more independence and safe mobility.  Pt is still recommended to CIR since she has family help at home and wants to regain her independence.   Follow Up Recommendations  CIR     Equipment Recommendations  None recommended by PT    Recommendations for Other Services Rehab consult     Precautions / Restrictions Precautions Precautions: Fall Precaution Comments: L AKA, extremely painful Restrictions Weight Bearing Restrictions: Yes LLE Weight Bearing: Non weight bearing(L AKA)    Mobility  Bed Mobility Overal bed mobility: Needs Assistance Bed Mobility: Supine to Sit;Sit to Supine     Supine to sit: Mod assist;+2 for physical assistance Sit to supine: Min assist;+2 for physical assistance   General bed mobility comments: Moved very slowly with Mod assist of 2 throughout the process of getting to EOB; good use of rail to help pull self to the EOB  Transfers Overall transfer level: Needs assistance Equipment used: 2 person hand held assist;Rolling walker (2 wheeled)(and 3rd person in the wedge) Transfers: Sit to/from Stand Sit to Stand: Mod assist;Min assist         General transfer comment: Blocked knee on R for stability and pt required Min-Mod A to power into standing from elevated surface.  Ambulation/Gait             General Gait Details: unable per pt   Stairs             Wheelchair Mobility    Modified Rankin (Stroke Patients Only)       Balance Overall balance assessment: Needs assistance Sitting-balance support: Feet supported Sitting balance-Leahy Scale: Poor Sitting balance - Comments: Pt relies on UE support. Min-mod A for seated balance without UE support Postural control: Right lateral lean;Posterior lean Standing balance support: During functional activity;Bilateral upper extremity supported Standing balance-Leahy Scale: Poor Standing balance comment: Requires UE support                            Cognition Arousal/Alertness: Awake/alert Behavior During Therapy: WFL for tasks assessed/performed Overall Cognitive Status: Impaired/Different from baseline Area of Impairment: Safety/judgement;Following commands;Awareness                       Following Commands: Follows one step commands with increased time;Follows multi-step commands inconsistently;Follows multi-step commands with increased time Safety/Judgement: Decreased awareness of safety;Decreased awareness of deficits Awareness: Emergent   General Comments: Pt with no memory of prior conversation on dc to CIR for further therapy. Pt also requiring increased time and VCs for processing and performing mobility.      Exercises      General Comments General comments (skin integrity, edema, etc.): L AK stump very edematous and has vac in place, painful to get near      Pertinent Vitals/Pain Faces Pain Scale: Hurts whole lot Pain  Location: L LE Pain Descriptors / Indicators: Operative site guarding;Sharp    Home Living                      Prior Function            PT Goals (current goals can now be found in the care plan section) Acute Rehab PT Goals Patient Stated Goal: to decrease pain PT Goal Formulation: With patient Progress towards PT goals: Progressing toward goals    Frequency    Min 3X/week      PT Plan Current plan  remains appropriate    Co-evaluation PT/OT/SLP Co-Evaluation/Treatment: Yes Reason for Co-Treatment: Complexity of the patient's impairments (multi-system involvement);For patient/therapist safety PT goals addressed during session: Mobility/safety with mobility;Balance OT goals addressed during session: ADL's and self-care      AM-PAC PT "6 Clicks" Daily Activity  Outcome Measure  Difficulty turning over in bed (including adjusting bedclothes, sheets and blankets)?: Unable Difficulty moving from lying on back to sitting on the side of the bed? : Unable Difficulty sitting down on and standing up from a chair with arms (e.g., wheelchair, bedside commode, etc,.)?: Unable Help needed moving to and from a bed to chair (including a wheelchair)?: A Lot Help needed walking in hospital room?: A Lot Help needed climbing 3-5 steps with a railing? : Total 6 Click Score: 8    End of Session Equipment Utilized During Treatment: Gait belt Activity Tolerance: Patient limited by pain Patient left: in bed;with call bell/phone within reach;with bed alarm set Nurse Communication: Mobility status PT Visit Diagnosis: Other abnormalities of gait and mobility (R26.89);Muscle weakness (generalized) (M62.81);Pain Pain - Right/Left: Left Pain - part of body: Leg     Time: [id number] PT Time Calculation (min) (ACUTE ONLY): 25 min  Charges:  $Therapeutic Activity: 8-22 mins                    G Codes:  Functional Assessment Tool Used: AM-PAC 6 Clicks Basic Mobility     Ivar DrapeRuth E Raji Glinski 08/28/2017, 5:52 PM   Samul Dadauth Chelsei Mcchesney, PT MS Acute Rehab Dept. Number: [facility] [phone] and [facility] [phone]

## 2017-08-28 NOTE — Progress Notes (Signed)
Pharmacy Antibiotic Note  Danielle Buchanan is a 61 y.o. female admitted on 08/23/2017 with wound infection.  Pharmacy managing Zosyn and vancomycin dosing.   Continues on abx for wound infection. Pt had recent revision of left below-knee amputation now with significant drainage and pain. Now s/p AKA on 11/20 but with copious amounts of pus drained and were not able to get all of infected tissue. May need to go back to OR in 48-72 hrs. Afebrile, WBC up to 18.   Plan: Continue Zosyn 3.375 gm IV Q8 hours Continue vancomycin 1g IV Q12 hours Monitor clinical picture, renal function, VT prn F/U LOT  Height: 5\' 5"  (165.1 cm) Weight: 208 lb 12.4 oz (94.7 kg) IBW/kg (Calculated) : 57  Temp (24hrs), Avg:99.2 F (37.3 C), Min:98.2 F (36.8 C), Max:100.2 F (37.9 C)  Recent Labs  Lab 08/23/17 1326 08/25/17 1245 08/27/17 0601 08/28/17 0753  WBC 18.3*  --  11.1*  --   CREATININE 0.59  --  0.71 0.74  VANCOTROUGH  --  19  --   --     Estimated Creatinine Clearance: 84.1 mL/min (by C-G formula based on SCr of 0.74 mg/dL).    Allergies  Allergen Reactions  . Doxycycline Rash    Antimicrobials this admission: Vanc 11/16 >>  Zosyn 11/16 >>   Dose adjustments this admission: 11/18: 12-hr VT 19 on Vanc 1250 mg IV Q 24 h  Microbiology results: No cx's  Thank you for allowing pharmacy to be a part of this patient's care.  Enzo BiNathan Demontrez Rindfleisch, PharmD, BCPS Clinical Pharmacist Pager [phone] 08/28/2017 9:48 AM

## 2017-08-28 NOTE — H&P (View-Only) (Signed)
  Progress Note    08/28/2017 12:39 PM 1 Day Post-Op  Subjective:  Pain is not controlled  Tm 100.2 now 99.1 (@0500 )  Vitals:   08/28/17 0453 08/28/17 0526  BP: 135/62 (!) 144/76  Pulse: 79 84  Resp: 18 18  Temp: 99.5 F (37.5 C) 99.1 F (37.3 C)  SpO2: 94% 93%    Physical Exam: Incisions:  Wound vac with good seal    CBC    Component Value Date/Time   WBC 11.1 (H) 08/27/2017 0601   RBC 3.40 (L) 08/27/2017 0601   HGB 9.0 (L) 08/27/2017 0601   HCT 29.7 (L) 08/27/2017 0601   PLT 462 (H) 08/27/2017 0601   MCV 87.4 08/27/2017 0601   MCH 26.5 08/27/2017 0601   MCHC 30.3 08/27/2017 0601   RDW 15.2 08/27/2017 0601   LYMPHSABS 2.6 08/23/2017 1326   MONOABS 0.9 08/23/2017 1326   EOSABS 0.2 08/23/2017 1326   BASOSABS 0.2 (H) 08/23/2017 1326    BMET    Component Value Date/Time   NA 131 (L) 08/28/2017 0753   K 4.5 08/28/2017 0753   CL 96 (L) 08/28/2017 0753   CO2 24 08/28/2017 0753   GLUCOSE 256 (H) 08/28/2017 0753   BUN 15 08/28/2017 0753   CREATININE 0.74 08/28/2017 0753   CALCIUM 8.7 (L) 08/28/2017 0753   GFRNONAA >60 08/28/2017 0753   GFRAA >60 08/28/2017 0753    INR    Component Value Date/Time   INR 1.16 08/23/2017 1326     Intake/Output Summary (Last 24 hours) at 08/28/2017 1239 Last data filed at 08/28/2017 1148 Gross per 24 hour  Intake 870 ml  Output 1410 ml  Net -540 ml     Assessment/Plan:  61 y.o. female is s/p left above knee amputation  1 Day Post-Op  -wound vac with good seal -pt does not have good pain control-will start PCA -for OR on Friday for washout/revision -continue IV abx   Doreatha MassedSamantha Rhyne, PA-C [facility] [phone] 08/28/2017 12:39 PM    I agree with the above.  I have seen and evaluated the patient.  We discussed operative plan for Friday.  Durene CalWells Elijha Dedman

## 2017-08-28 NOTE — Progress Notes (Signed)
Occupational Therapy Treatment and Re-evaluation Patient Details Name: Danielle Buchanan MRN: [id number] DOB: 05/28/1956 Today's Date: 08/28/2017    History of present illness Pt is a 61 y.o. female presents in follow-up from recent left revision of her left below-knee amputation.  She now has had significant drainage and pain.  She is followed by wound care for her right fourth and fifth toe ulceration. L AKA planned for early this week.   OT comments  Pt continues to be limited by pain and demonstrates poor balance. Pt requiring Min-Mod A +2 for bed mobility and sit<>stand. Pt presenting with decreased cognition with need for increased time and VCs. Pt currently not safe to dc home and is a fall risk. Continue to recommend dc to CIR for further OT to increase safety and independence with ADLs and functional transfer. Discussed dc recommendation for pt and she is agreeable to rehab plan. Will continue to follow acutely to facilitate safe dc.   Follow Up Recommendations  CIR    Equipment Recommendations  (defer to next venue)    Recommendations for Other Services Rehab consult    Precautions / Restrictions Precautions Precautions: Fall Precaution Comments: L BKA, extremely painful Restrictions Weight Bearing Restrictions: Yes LLE Weight Bearing: (L AKA)       Mobility Bed Mobility Overal bed mobility: Needs Assistance Bed Mobility: Supine to Sit;Sit to Supine     Supine to sit: Mod assist;+2 for physical assistance Sit to supine: Min assist;+2 for physical assistance   General bed mobility comments: Moved very slowly with Mod assist of 2 throughout the process of getting to EOB; good use of rail to help pull self to the EOB  Transfers Overall transfer level: Needs assistance Equipment used: 2 person hand held assist;Rolling walker (2 wheeled)(and 3rd person in the wedge) Transfers: Sit to/from Stand Sit to Stand: Mod assist;Min assist         General transfer comment:  Blocked knee on R for stability and pt required Min-Mod A to power into standing from elevated surface.    Balance Overall balance assessment: Needs assistance Sitting-balance support: Feet supported Sitting balance-Leahy Scale: Poor Sitting balance - Comments: Pt relies on UE support. Min-mod A for seated balance without UE support Postural control: Right lateral lean;Posterior lean Standing balance support: During functional activity;Bilateral upper extremity supported Standing balance-Leahy Scale: Poor Standing balance comment: Requires UE support                           ADL either performed or assessed with clinical judgement   ADL Overall ADL's : Needs assistance/impaired                                       General ADL Comments: Pt declining ADLs at this time due to fatigue and pain. Pt continues to demosntrating decreased activity tolerance and sitting balance with need for UE support while sitting EOB     Vision   Vision Assessment?: No apparent visual deficits   Perception     Praxis      Cognition Arousal/Alertness: Awake/alert Behavior During Therapy: WFL for tasks assessed/performed Overall Cognitive Status: Impaired/Different from baseline Area of Impairment: Safety/judgement;Following commands;Awareness                       Following Commands: Follows one step commands with increased time;Follows multi-step commands  inconsistently;Follows multi-step commands with increased time Safety/Judgement: Decreased awareness of safety;Decreased awareness of deficits Awareness: Emergent   General Comments: Pt with no memory of prior conversation on dc to CIR for further therapy. Pt also requiring increased time and VCs for processing and performing mobility.        Exercises     Shoulder Instructions       General Comments      Pertinent Vitals/ Pain       Faces Pain Scale: Hurts whole lot Pain Location: L LE Pain  Descriptors / Indicators: Operative site guarding;Sharp  Home Living                                          Prior Functioning/Environment              Frequency  Min 3X/week        Progress Toward Goals  OT Goals(current goals can now be found in the care plan section)  Progress towards OT goals: Progressing toward goals  Acute Rehab OT Goals Patient Stated Goal: to decrease pain OT Goal Formulation: With patient Time For Goal Achievement: 09/07/17 Potential to Achieve Goals: Good ADL Goals Pt Will Perform Grooming: with set-up;sitting Pt Will Perform Lower Body Bathing: with set-up;sitting/lateral leans Pt Will Perform Upper Body Dressing: with set-up;sitting Pt Will Perform Lower Body Dressing: with set-up;sitting/lateral leans Pt Will Transfer to Toilet: with min assist;squat pivot transfer;bedside commode Pt Will Perform Toileting - Clothing Manipulation and hygiene: with supervision;sitting/lateral leans  Plan Discharge plan needs to be updated    Co-evaluation    PT/OT/SLP Co-Evaluation/Treatment: Yes Reason for Co-Treatment: Complexity of the patient's impairments (multi-system involvement) PT goals addressed during session: Mobility/safety with mobility OT goals addressed during session: ADL's and self-care      AM-PAC PT "6 Clicks" Daily Activity     Outcome Measure   Help from another person eating meals?: A Little Help from another person taking care of personal grooming?: A Little Help from another person toileting, which includes using toliet, bedpan, or urinal?: A Lot Help from another person bathing (including washing, rinsing, drying)?: A Lot Help from another person to put on and taking off regular upper body clothing?: A Little Help from another person to put on and taking off regular lower body clothing?: A Lot 6 Click Score: 15    End of Session Equipment Utilized During Treatment: Gait belt;Rolling walker  OT Visit  Diagnosis: Muscle weakness (generalized) (M62.81);Pain Pain - Right/Left: Left Pain - part of body: Leg   Activity Tolerance Patient limited by pain;Patient limited by fatigue   Patient Left in bed;with call bell/phone within reach;with nursing/sitter in room   Nurse Communication Patient requests pain meds;Mobility status        Time: [id number] OT Time Calculation (min): 24 min  Charges: OT General Charges $OT Visit: 1 Visit OT Evaluation $OT Re-eval: 1 Re-eval  Hamsini Verrilli MSOT, OTR/L Acute Rehab Pager: [phone] Office: [phone]   Theodoro GristCharis M Paulena Servais 08/28/2017, 5:23 PM

## 2017-08-28 NOTE — Care Management Important Message (Signed)
Important Message  Patient Details  Name: Danielle Buchanan MRN: [id number] Date of Birth: [birth year]   Medicare Important Message Given:  Yes    [name] [name] 08/28/2017, 1:46 PM

## 2017-08-29 LAB — GLUCOSE, CAPILLARY
GLUCOSE-CAPILLARY: 135 mg/dL — AB (ref 65–99)
Glucose-Capillary: 88 mg/dL (ref 65–99)
Glucose-Capillary: 191 mg/dL — ABNORMAL HIGH (ref 65–99)
Glucose-Capillary: 161 mg/dL — ABNORMAL HIGH (ref 65–99)

## 2017-08-29 LAB — VANCOMYCIN, TROUGH: Vancomycin Tr: 15 ug/mL (ref 15–20)

## 2017-08-29 NOTE — Progress Notes (Signed)
   VASCULAR SURGERY ASSESSMENT & PLAN:   For revision of left AKA tomorrow.  All of her questions were answered.    SUBJECTIVE:   Pain under better control.  PHYSICAL EXAM:   Vitals:   08/28/17 2215 08/29/17 0000 08/29/17 0632 08/29/17 0830  BP:   134/76   Pulse:   75   Resp: 16 18 16 20   Temp:   98.5 F (36.9 C)   TempSrc:   Oral   SpO2: 98% 98% 98% 95%  Weight:      Height:       VAC with good seal.  LABS:   Lab Results  Component Value Date   WBC 11.1 (H) 08/27/2017   HGB 9.0 (L) 08/27/2017   HCT 29.7 (L) 08/27/2017   MCV 87.4 08/27/2017   PLT 462 (H) 08/27/2017   Lab Results  Component Value Date   CREATININE 0.74 08/28/2017   Lab Results  Component Value Date   INR 1.16 08/23/2017   CBG (last 3)  Recent Labs    08/28/17 1655 08/28/17 2044 08/29/17 0742  GLUCAP 173* 127* 135*    PROBLEM LIST:    Active Problems:   Amputation stump infection (HCC)   Amputation of left lower extremity above knee upon examination (HCC)   CURRENT MEDS:   . amitriptyline  25 mg Oral QHS  . collagenase  1 application Topical Daily  . collagenase   Topical Daily  . heparin  5,000 Units Subcutaneous Q8H  . insulin aspart  0-15 Units Subcutaneous TID WC  . insulin aspart protamine- aspart  70 Units Subcutaneous BID WC  . loratadine  10 mg Oral Daily  . losartan  100 mg Oral Daily  . Melatonin  3 mg Oral QHS  . morphine   Intravenous Q4H  . multivitamin with minerals  1 tablet Oral Daily  . nicotine  14 mg Transdermal Daily  . pravastatin  20 mg Oral QHS  . pregabalin  75 mg Oral BID  . protein supplement shake  2 oz Oral TID BM  . sertraline  150 mg Oral Daily  . traZODone  50 mg Oral QHS    Danielle Buchanan Beeper: [phone] Office: [phone] 08/29/2017

## 2017-08-29 NOTE — Progress Notes (Signed)
Pharmacy Antibiotic Note  Danielle Buchanan is a 61 y.o. female admitted on 08/23/2017 with wound infection.  Pharmacy managing Zosyn and vancomycin dosing.   Continues on abx for wound infection. Pt had recent revision of left below-knee amputation with significant drainage and pain. Now s/p AKA on 11/20 but with copious amounts of pus drained and were not able to get all of infected tissue. Plans to go back for revision of L AKA tomorrow per vascular's note.  Vancomycin trough this afternoon therapeutic at 6515mcg/mL. SCr stable.  Plan: Continue Zosyn 3.375g IV q8h EI Continue vancomycin 1g IV q12h Monitor clinical picture, renal function, VT prn F/U LOT  Height: 5\' 5"  (165.1 cm) Weight: 208 lb 12.4 oz (94.7 kg) IBW/kg (Calculated) : 57  Temp (24hrs), Avg:98.8 F (37.1 C), Min:98.3 F (36.8 C), Max:99.6 F (37.6 C)  Recent Labs  Lab 08/23/17 1326 08/25/17 1245 08/27/17 0601 08/28/17 0753 08/29/17 1539  WBC 18.3*  --  11.1*  --   --   CREATININE 0.59  --  0.71 0.74  --   VANCOTROUGH  --  19  --   --  15    Estimated Creatinine Clearance: 84.1 mL/min (by C-G formula based on SCr of 0.74 mg/dL).    Allergies  Allergen Reactions  . Doxycycline Rash    Antimicrobials this admission: Vanc 11/16 >>  Zosyn 11/16 >>   Dose adjustments this admission: 11/18 VT = 19 on 1250 mg IV q24h 11/22 VT = 18 on 1g IV q12h  Microbiology results: No cxs  Thank you for allowing pharmacy to be a part of this patient's care.  Kingsley Farace D. Harley Mccartney, PharmD, BCPS Clinical Pharmacist [phone] 08/29/2017 4:25 PM

## 2017-08-29 NOTE — Anesthesia Preprocedure Evaluation (Addendum)
Anesthesia Evaluation  Patient identified by MRN, date of birth, ID band Patient awake    Reviewed: Allergy & Precautions, H&P , NPO status , Patient's Chart, lab work & pertinent test results  Airway Mallampati: III  TM Distance: >3 FB Neck ROM: Full    Dental no notable dental hx. (+) Teeth Intact, Dental Advisory Given   Pulmonary sleep apnea , former smoker,    Pulmonary exam normal breath sounds clear to auscultation       Cardiovascular Exercise Tolerance: Good hypertension, Pt. on medications + Peripheral Vascular Disease   Rhythm:Regular Rate:Normal     Neuro/Psych Depression negative neurological ROS  negative psych ROS   GI/Hepatic negative GI ROS, Neg liver ROS,   Endo/Other  diabetes, Insulin Dependent  Renal/GU negative Renal ROS     Musculoskeletal   Abdominal (+) + obese,   Peds  Hematology  (+) anemia ,   Anesthesia Other Findings Dyslipidemia  Reproductive/Obstetrics                             Anesthesia Physical  Anesthesia Plan  ASA: III  Anesthesia Plan: General   Post-op Pain Management:    Induction: Intravenous  PONV Risk Score and Plan: 3 and Ondansetron, Dexamethasone and Midazolam  Airway Management Planned: LMA  Additional Equipment:   Intra-op Plan:   Post-operative Plan: Extubation in OR  Informed Consent: I have reviewed the patients History and Physical, chart, labs and discussed the procedure including the risks, benefits and alternatives for the proposed anesthesia with the patient or authorized representative who has indicated his/her understanding and acceptance.   Dental advisory given  Plan Discussed with: CRNA  Anesthesia Plan Comments:        Anesthesia Quick Evaluation

## 2017-08-30 ENCOUNTER — Inpatient Hospital Stay (HOSPITAL_COMMUNITY): Payer: Medicare HMO | Admitting: Anesthesiology

## 2017-08-30 ENCOUNTER — Encounter (HOSPITAL_COMMUNITY)
Admission: AD | Disposition: A | Discharge status: Home Health | Payer: Self-pay | Source: Ambulatory Visit | Attending: Vascular Surgery

## 2017-08-30 ENCOUNTER — Encounter (HOSPITAL_COMMUNITY): Payer: Self-pay | Admitting: Certified Registered Nurse Anesthetist

## 2017-08-30 DIAGNOSIS — E669 Obesity, unspecified: Secondary | ICD-10-CM

## 2017-08-30 DIAGNOSIS — F329 Major depressive disorder, single episode, unspecified: Secondary | ICD-10-CM

## 2017-08-30 DIAGNOSIS — E875 Hyperkalemia: Secondary | ICD-10-CM

## 2017-08-30 DIAGNOSIS — Z89612 Acquired absence of left leg above knee: Secondary | ICD-10-CM

## 2017-08-30 DIAGNOSIS — S78112A Complete traumatic amputation at level between left hip and knee, initial encounter: Secondary | ICD-10-CM

## 2017-08-30 DIAGNOSIS — T8740 Infection of amputation stump, unspecified extremity: Secondary | ICD-10-CM

## 2017-08-30 DIAGNOSIS — G4733 Obstructive sleep apnea (adult) (pediatric): Secondary | ICD-10-CM

## 2017-08-30 DIAGNOSIS — G8918 Other acute postprocedural pain: Secondary | ICD-10-CM

## 2017-08-30 DIAGNOSIS — D72829 Elevated white blood cell count, unspecified: Secondary | ICD-10-CM

## 2017-08-30 DIAGNOSIS — IMO0002 Reserved for concepts with insufficient information to code with codable children: Secondary | ICD-10-CM

## 2017-08-30 DIAGNOSIS — I10 Essential (primary) hypertension: Secondary | ICD-10-CM

## 2017-08-30 DIAGNOSIS — E1365 Other specified diabetes mellitus with hyperglycemia: Secondary | ICD-10-CM

## 2017-08-30 DIAGNOSIS — E1169 Type 2 diabetes mellitus with other specified complication: Secondary | ICD-10-CM

## 2017-08-30 DIAGNOSIS — D62 Acute posthemorrhagic anemia: Secondary | ICD-10-CM

## 2017-08-30 DIAGNOSIS — E1351 Other specified diabetes mellitus with diabetic peripheral angiopathy without gangrene: Secondary | ICD-10-CM

## 2017-08-30 HISTORY — PX: AMPUTATION: SHX166

## 2017-08-30 LAB — BASIC METABOLIC PANEL
Anion gap: 10 (ref 5–15)
BUN: 12 mg/dL (ref 6–20)
CHLORIDE: 98 mmol/L — AB (ref 101–111)
CO2: 28 mmol/L (ref 22–32)
CREATININE: 0.78 mg/dL (ref 0.44–1.00)
Calcium: 9.1 mg/dL (ref 8.9–10.3)
Glucose, Bld: 122 mg/dL — ABNORMAL HIGH (ref 65–99)
POTASSIUM: 5.3 mmol/L — AB (ref 3.5–5.1)
SODIUM: 136 mmol/L (ref 135–145)

## 2017-08-30 LAB — GLUCOSE, CAPILLARY
GLUCOSE-CAPILLARY: 163 mg/dL — AB (ref 65–99)
Glucose-Capillary: 255 mg/dL — ABNORMAL HIGH (ref 65–99)
Glucose-Capillary: 324 mg/dL — ABNORMAL HIGH (ref 65–99)
Glucose-Capillary: 173 mg/dL — ABNORMAL HIGH (ref 65–99)

## 2017-08-30 LAB — CBC
HEMATOCRIT: 28 % — AB (ref 36.0–46.0)
Hemoglobin: 8.7 g/dL — ABNORMAL LOW (ref 12.0–15.0)
MCH: 26.9 pg (ref 26.0–34.0)
MCHC: 31.1 g/dL (ref 30.0–36.0)
MCV: 86.7 fL (ref 78.0–100.0)
PLATELETS: 431 10*3/uL — AB (ref 150–400)
RBC: 3.23 MIL/uL — AB (ref 3.87–5.11)
RDW: 15.4 % (ref 11.5–15.5)
WBC: 11 10*3/uL — AB (ref 4.0–10.5)

## 2017-08-30 LAB — PROTIME-INR
INR: 1.12
Prothrombin Time: 14.3 seconds (ref 11.4–15.2)

## 2017-08-30 SURGERY — AMPUTATION, ABOVE KNEE
Anesthesia: General | Site: Leg Upper | Laterality: Left

## 2017-08-30 MED ORDER — METOPROLOL TARTRATE 5 MG/5ML IV SOLN: 2.0000 mg | INTRAVENOUS | Status: DC | PRN

## 2017-08-30 MED ORDER — BISACODYL 10 MG RE SUPP
10.0000 mg | Freq: Every day | RECTAL | Status: DC | PRN
Start: 2017-08-30 — End: 2017-09-03

## 2017-08-30 MED ORDER — SODIUM CHLORIDE 0.9 % IR SOLN
Status: DC | PRN
  Administered 2017-08-30: 3000 mL

## 2017-08-30 MED ORDER — LIDOCAINE 2% (20 MG/ML) 5 ML SYRINGE
INTRAMUSCULAR | Status: DC | PRN
  Administered 2017-08-30: 70 mg via INTRAVENOUS

## 2017-08-30 MED ORDER — DOCUSATE SODIUM 100 MG PO CAPS
100.0000 mg | ORAL_CAPSULE | Freq: Every day | ORAL | Status: DC
  Administered 2017-08-31 – 2017-09-03 (×3): 100 mg via ORAL
  Filled 2017-08-30 (×5): qty 1

## 2017-08-30 MED ORDER — VANCOMYCIN HCL 1000 MG IV SOLR
INTRAVENOUS | Status: AC
  Filled 2017-08-30: qty 1000

## 2017-08-30 MED ORDER — FENTANYL CITRATE (PF) 100 MCG/2ML IJ SOLN
INTRAMUSCULAR | Status: DC | PRN
  Administered 2017-08-30 (×8): 50 ug via INTRAVENOUS

## 2017-08-30 MED ORDER — MIDAZOLAM HCL 2 MG/2ML IJ SOLN
INTRAMUSCULAR | Status: AC
  Filled 2017-08-30: qty 2

## 2017-08-30 MED ORDER — VANCOMYCIN HCL 500 MG IV SOLR
INTRAVENOUS | Status: AC
  Filled 2017-08-30: qty 500

## 2017-08-30 MED ORDER — 0.9 % SODIUM CHLORIDE (POUR BTL) OPTIME
TOPICAL | Status: DC | PRN
  Administered 2017-08-30: 1000 mL

## 2017-08-30 MED ORDER — ALUM & MAG HYDROXIDE-SIMETH 200-200-20 MG/5ML PO SUSP: 15.0000 mL | ORAL | Status: DC | PRN

## 2017-08-30 MED ORDER — PROPOFOL 10 MG/ML IV BOLUS
INTRAVENOUS | Status: AC
  Filled 2017-08-30: qty 20

## 2017-08-30 MED ORDER — ONDANSETRON HCL 4 MG/2ML IJ SOLN: 4.0000 mg | Freq: Once | INTRAMUSCULAR | Status: DC | PRN

## 2017-08-30 MED ORDER — PANTOPRAZOLE SODIUM 40 MG PO TBEC
40.0000 mg | DELAYED_RELEASE_TABLET | Freq: Every day | ORAL | Status: DC
  Administered 2017-08-30 – 2017-09-03 (×5): 40 mg via ORAL
  Filled 2017-08-30 (×5): qty 1

## 2017-08-30 MED ORDER — ACETAMINOPHEN 650 MG RE SUPP: 325.0000 mg | RECTAL | Status: DC | PRN

## 2017-08-30 MED ORDER — FENTANYL CITRATE (PF) 250 MCG/5ML IJ SOLN
INTRAMUSCULAR | Status: AC
  Filled 2017-08-30: qty 5

## 2017-08-30 MED ORDER — GENTAMICIN SULFATE 40 MG/ML IJ SOLN
INTRAMUSCULAR | Status: AC
  Filled 2017-08-30: qty 2

## 2017-08-30 MED ORDER — LACTATED RINGERS IV SOLN
INTRAVENOUS | Status: DC
  Administered 2017-08-30: 08:00:00 via INTRAVENOUS

## 2017-08-30 MED ORDER — FENTANYL CITRATE (PF) 100 MCG/2ML IJ SOLN: 25.0000 ug | INTRAMUSCULAR | Status: DC | PRN

## 2017-08-30 MED ORDER — EPHEDRINE SULFATE 50 MG/ML IJ SOLN
INTRAMUSCULAR | Status: DC | PRN
  Administered 2017-08-30: 15 mg via INTRAVENOUS
  Administered 2017-08-30 (×2): 10 mg via INTRAVENOUS

## 2017-08-30 MED ORDER — PHENOL 1.4 % MT LIQD
1.0000 | OROMUCOSAL | Status: DC | PRN
Start: 2017-08-30 — End: 2017-09-03

## 2017-08-30 MED ORDER — HYDRALAZINE HCL 20 MG/ML IJ SOLN: 5.0000 mg | INTRAMUSCULAR | Status: DC | PRN

## 2017-08-30 MED ORDER — LABETALOL HCL 5 MG/ML IV SOLN
10.0000 mg | INTRAVENOUS | Status: DC | PRN
  Filled 2017-08-30: qty 4

## 2017-08-30 MED ORDER — ACETAMINOPHEN 325 MG PO TABS: 325.0000 mg | ORAL_TABLET | ORAL | Status: DC | PRN

## 2017-08-30 MED ORDER — ONDANSETRON HCL 4 MG/2ML IJ SOLN
INTRAMUSCULAR | Status: DC | PRN
  Administered 2017-08-30: 4 mg via INTRAVENOUS

## 2017-08-30 MED ORDER — GENTAMICIN SULFATE 40 MG/ML IJ SOLN
INTRAMUSCULAR | Status: DC | PRN
  Administered 2017-08-30: 120 mg via INTRAMUSCULAR

## 2017-08-30 MED ORDER — MIDAZOLAM HCL 5 MG/5ML IJ SOLN
INTRAMUSCULAR | Status: DC | PRN
  Administered 2017-08-30: 2 mg via INTRAVENOUS

## 2017-08-30 MED ORDER — DEXAMETHASONE SODIUM PHOSPHATE 10 MG/ML IJ SOLN
INTRAMUSCULAR | Status: DC | PRN
  Administered 2017-08-30: 5 mg via INTRAVENOUS

## 2017-08-30 MED ORDER — PROPOFOL 10 MG/ML IV BOLUS
INTRAVENOUS | Status: DC | PRN
  Administered 2017-08-30: 170 mg via INTRAVENOUS

## 2017-08-30 MED ORDER — VANCOMYCIN HCL 500 MG IV SOLR
INTRAVENOUS | Status: DC | PRN
  Administered 2017-08-30: 500 mg

## 2017-08-30 SURGICAL SUPPLY — 53 items
BANDAGE ACE 4X5 VEL STRL LF (GAUZE/BANDAGES/DRESSINGS) ×2 IMPLANT
BANDAGE ACE 6X5 VEL STRL LF (GAUZE/BANDAGES/DRESSINGS) ×2 IMPLANT
BLADE SAW GIGLI 510 (BLADE) ×2 IMPLANT
BNDG COHESIVE 6X5 TAN STRL LF (GAUZE/BANDAGES/DRESSINGS) ×2 IMPLANT
BNDG GAUZE ELAST 4 BULKY (GAUZE/BANDAGES/DRESSINGS) ×4 IMPLANT
CANISTER SUCT 3000ML PPV (MISCELLANEOUS) ×2 IMPLANT
CLIP VESOCCLUDE MED 6/CT (CLIP) ×2 IMPLANT
COVER SURGICAL LIGHT HANDLE (MISCELLANEOUS) ×2 IMPLANT
DRAIN CHANNEL 19F RND (DRAIN) IMPLANT
DRAPE HALF SHEET 40X57 (DRAPES) ×2 IMPLANT
DRAPE ORTHO SPLIT 77X108 STRL (DRAPES) ×2
DRAPE SURG ORHT 6 SPLT 77X108 (DRAPES) ×2 IMPLANT
DRSG ADAPTIC 3X8 NADH LF (GAUZE/BANDAGES/DRESSINGS) ×2 IMPLANT
DRSG VAC ATS MED SENSATRAC (GAUZE/BANDAGES/DRESSINGS) ×2 IMPLANT
ELECT CAUTERY BLADE 6.4 (BLADE) ×2 IMPLANT
ELECT REM PT RETURN 9FT ADLT (ELECTROSURGICAL) ×2
ELECTRODE REM PT RTRN 9FT ADLT (ELECTROSURGICAL) ×1 IMPLANT
EVACUATOR SILICONE 100CC (DRAIN) IMPLANT
GAUZE SPONGE 4X4 12PLY STRL (GAUZE/BANDAGES/DRESSINGS) ×4 IMPLANT
GAUZE SPONGE 4X4 16PLY XRAY LF (GAUZE/BANDAGES/DRESSINGS) ×2 IMPLANT
GLOVE BIOGEL PI IND STRL 6.5 (GLOVE) ×1 IMPLANT
GLOVE BIOGEL PI IND STRL 7.0 (GLOVE) ×2 IMPLANT
GLOVE BIOGEL PI IND STRL 7.5 (GLOVE) ×1 IMPLANT
GLOVE BIOGEL PI INDICATOR 6.5 (GLOVE) ×1
GLOVE BIOGEL PI INDICATOR 7.0 (GLOVE) ×2
GLOVE BIOGEL PI INDICATOR 7.5 (GLOVE) ×1
GLOVE SURG SS PI 6.0 STRL IVOR (GLOVE) ×2 IMPLANT
GLOVE SURG SS PI 7.5 STRL IVOR (GLOVE) ×2 IMPLANT
GOWN STRL REUS W/ TWL LRG LVL3 (GOWN DISPOSABLE) ×2 IMPLANT
GOWN STRL REUS W/ TWL XL LVL3 (GOWN DISPOSABLE) ×1 IMPLANT
GOWN STRL REUS W/TWL LRG LVL3 (GOWN DISPOSABLE) ×2
GOWN STRL REUS W/TWL XL LVL3 (GOWN DISPOSABLE) ×1
HANDPIECE INTERPULSE COAX TIP (DISPOSABLE) ×1
KIT BASIN OR (CUSTOM PROCEDURE TRAY) ×2 IMPLANT
KIT ROOM TURNOVER OR (KITS) ×2 IMPLANT
KIT STIMULAN RAPID CURE 5CC (Orthopedic Implant) ×2 IMPLANT
NS IRRIG 1000ML POUR BTL (IV SOLUTION) ×2 IMPLANT
PACK GENERAL/GYN (CUSTOM PROCEDURE TRAY) ×2 IMPLANT
PAD ARMBOARD 7.5X6 YLW CONV (MISCELLANEOUS) ×4 IMPLANT
SET HNDPC FAN SPRY TIP SCT (DISPOSABLE) ×1 IMPLANT
STAPLER VISISTAT 35W (STAPLE) ×2 IMPLANT
STOCKINETTE IMPERVIOUS LG (DRAPES) ×2 IMPLANT
SUT ETHILON 3 0 PS 1 (SUTURE) IMPLANT
SUT SILK 0 TIES 10X30 (SUTURE) ×2 IMPLANT
SUT SILK 2 0 (SUTURE)
SUT SILK 2-0 18XBRD TIE 12 (SUTURE) IMPLANT
SUT SILK 3 0 (SUTURE)
SUT SILK 3-0 18XBRD TIE 12 (SUTURE) IMPLANT
SUT VIC AB 2-0 CT1 18 (SUTURE) ×6 IMPLANT
TAPE UMBILICAL COTTON 1/8X30 (MISCELLANEOUS) IMPLANT
UNDERPAD 30X30 (UNDERPADS AND DIAPERS) ×2 IMPLANT
WATER STERILE IRR 1000ML POUR (IV SOLUTION) ×2 IMPLANT
WND VAC CANISTER 500ML (MISCELLANEOUS) ×2 IMPLANT

## 2017-08-30 NOTE — Interval H&P Note (Signed)
History and Physical Interval Note:  08/30/2017 7:48 AM  Danielle Buchanan  has presented today for surgery, with the diagnosis of nonviable tissue  The various methods of treatment have been discussed with the patient and family. After consideration of risks, benefits and other options for treatment, the patient has consented to  Procedure(s): REVISION AMPUTATION ABOVE KNEE (Left) as a surgical intervention .  The patient's history has been reviewed, patient examined, no change in status, stable for surgery.  I have reviewed the patient's chart and labs.  Questions were answered to the patient's satisfaction.     Durene CalWells Brabham

## 2017-08-30 NOTE — Op Note (Signed)
    Patient name: Danielle ClassJanice Peifer MRN: [id number] DOB: [birth year] Sex: female  08/30/2017 Pre-operative Diagnosis: Left above-knee amputation infection Post-operative diagnosis:  Same Surgeon:  Durene CalWells Brenden Rudman Assistants:  Aggie MoatsMatt Eveland Procedure:   #1: Pulse lavage irrigation of the left above-knee amputation   #2: Placement of antibiotic impregnated beads   #3 fascial closure of above-knee amputation   #4: Application of wound VAC Anesthesia: General Blood Loss: Minimal Specimens: None  Findings: The muscle and tissue all looked very healthy.  There was a tunnel on the posterior aspect of the incision that tracks proximally.  No gross infection was identified however there was dirty appearing fluid which ultimately led me to closed the incision with a wound VAC rather than staples  Indications: The patient has previously undergone a left above-knee amputation.  The wound was left open because of a posterior tracking infection.  She comes in today for further evaluation and possible closure.  Procedure:  The patient [facility] holding [facility]  The patient was then placed supine on the table. general anesthesia was administered.  The patient was prepped and draped in the usual sterile fashion.  A time out was called and antibiotics were administered.  3 L of saline was used to irrigate the wound after the wound VAC was removed.  This was done using a Pulsavac.  There was a tunnel tracking between muscle planes on the posterior aspect of the leg.  No gross purulence was seen however there was some murky appearing fluid.  All the muscle was pink and healthy.  I placed antibiotic impregnated beads (vancomycin and gentamicin) along the tunnel in the posterior part of the thigh.  I then reapproximated the fascia with interrupted 2-0 Vicryl suture.  Because of the fluid in the posterior compartment, I elected not to close the skin at this time but place a wound VAC.   Patient tolerated the procedure well there were no immediate complications.   Disposition: To PACU stable   V. Durene CalWells Winifred Bodiford, M.D. [facility] [place] Office: [phone] Pager:  [phone]

## 2017-08-30 NOTE — Anesthesia Postprocedure Evaluation (Signed)
Anesthesia Post Note  Patient: Danielle Buchanan  Procedure(s) Performed: REVISION AMPUTATION ABOVE KNEE (Left Leg Upper)     Patient location during evaluation: PACU Anesthesia Type: General Level of consciousness: awake and alert Pain management: pain level controlled Vital Signs Assessment: post-procedure vital signs reviewed and stable Respiratory status: spontaneous breathing, nonlabored ventilation, respiratory function stable and patient connected to nasal cannula oxygen Cardiovascular status: blood pressure returned to baseline and stable Postop Assessment: no apparent nausea or vomiting Anesthetic complications: no    Last Vitals:  Vitals:   08/30/17 1034 08/30/17 1237  BP: (!) 148/87 (!) 145/65  Pulse: 81 91  Resp: 15 16  Temp: 36.4 C 36.9 C  SpO2: 94% 95%    Last Pain:  Vitals:   08/30/17 1237  TempSrc: Oral  PainSc:                  Catheryn Baconyan P Jahvier Aldea

## 2017-08-30 NOTE — Progress Notes (Signed)
Rehab admissions - I met with patient and I am following for potential acute inpatient rehab admission.  Will see how patient does with therapies and will follow up on Monday.  Call me for questions.  #[phone]

## 2017-08-30 NOTE — Progress Notes (Signed)
Physical Medicine and Rehabilitation Consult Reason for Consult: Functional deficits Referring Physician: Dr. Myra Gianotti  HPI: Danielle Buchanan is a 61 y.o. female with history of HTN, uncontrolled T2DM with neuropathy, multiple TBI,  memory disorder, OSA, PAD with ischemic toes right foot and recent revision of L-BKA but continued to have significant purulent drainage from wound and pain. History tkane from chart review and patient. She admitted on 08/23/17 for IV antibiotics and IV pain meds without improvement. She underwent L-AKA with wound vac on 08/27/17 by [name]. Post of with fevers and continued issues with pain control requiring revision of L-AKA on 08/30/17. Therapy ongoing and patient limited by pain with poor balance affecting functional status. CIR recommended for follow up therapy.     Review of Systems  Gastrointestinal: Positive for constipation and nausea.  Musculoskeletal: Positive for joint pain and myalgias.  All other systems reviewed and are negative.  Past Medical History:  Diagnosis Date  . Allergic rhinitis   . Depression   . Diabetes (HCC)   . Dyslipidemia   . HTN (hypertension)   . Insomnia   . OSA (obstructive sleep apnea)    no cpap  . Urinary incontinence    Past Surgical History:  Procedure Laterality Date  . ABDOMINAL AORTOGRAM W/LOWER EXTREMITY N/A 06/24/2017   Procedure: ABDOMINAL AORTOGRAM W/LOWER EXTREMITY;  Surgeon: Maeola Harman, MD;  Location: [facility] INVASIVE CV LAB;  Service: Cardiovascular;  Laterality: N/A;  . AMPUTATION Left 07/23/2017   Procedure: AMPUTATION  BELOW KNEE;  Surgeon: Maeola Harman, MD;  Location: [facility] OR;  Service: Vascular;  Laterality: Left;  . AMPUTATION Left 08/12/2017   Procedure: REVISION BELOW KNEE;  Surgeon: Maeola Harman, MD;  Location: [facility] OR;  Service: Vascular;  Laterality: Left;  . AMPUTATION Left 08/27/2017   Procedure: left ABOVE KNEE AMPUTATION;  Surgeon: Maeola Harman, MD;  Location: [facility] OR;  Service: Vascular;  Laterality: Left;  . APPLICATION OF WOUND VAC Left 08/27/2017   Procedure: APPLICATION OF WOUND VAC;  Surgeon: Maeola Harman, MD;  Location: [facility] OR;  Service: Vascular;  Laterality: Left;  . CARPAL TUNNEL RELEASE Right   . COLONOSCOPY     Family History  Problem Relation Age of Onset  . Heart attack Mother   . Alcohol abuse Mother   . Heart attack Father   . Alcohol abuse Father   . Heart attack Sister   . Heart attack Brother   . Hypercholesterolemia Sister   . Diabetes Neg Hx    Social History: Lives with sister. Retired Arboriculturist. Per  reports that she quit smoking about 2 months ago. Her smoking use included cigarettes. She has a 51.00 pack-year smoking history. she has never used smokeless tobacco. She reports that she does not drink alcohol or use drugs.   Allergies:  Allergies  Allergen Reactions  . Doxycycline Rash   Medications Prior to Admission  Medication Sig Dispense Refill  . amitriptyline (ELAVIL) 100 MG tablet Take 100 mg at bedtime by mouth.    . collagenase (SANTYL) ointment Apply 1 application topically daily.    . [employer] NICOTINE TRANSDERMAL SYS 14 MG/24HR patch Apply 1 patch daily for smoking cessation  11  . insulin NPH-regular Human (NOVOLIN 70/30) (70-30) 100 UNIT/ML injection 120 units with breakfast, and 90 units with the evening meal (Patient taking differently: Inject 70 Units 2 (two) times daily with a meal into the skin. ) 70 mL 11  . losartan (COZAAR) 100  MG tablet Take 100 mg by mouth daily.    Marland Kitchen. LYRICA 75 MG capsule Take 75 mg 2 (two) times daily by mouth.    . Melatonin 5 MG TABS Take 5 mg by mouth at bedtime.    Marland Kitchen. oxyCODONE-acetaminophen (PERCOCET/ROXICET) 5-325 MG tablet Take 1-2 tablets every 4 (four) hours as needed by mouth for moderate pain. 30 tablet 0  . pravastatin (PRAVACHOL) 20 MG tablet Take 20 mg by mouth daily.     . sertraline (ZOLOFT) 100 MG tablet Take 150 mg by mouth  daily.     . traZODone (DESYREL) 50 MG tablet Take 50 mg by mouth at bedtime.    Marland Kitchen. VITAMIN E PO Take 1 tablet daily by mouth.     . Zinc 50 MG TABS Take 50 mg by mouth daily.      Home: Home Living Family/patient expects to be discharged to:: Private residence Living Arrangements: Other relatives Available Help at Discharge: Family, Available 24 hours/day Type of Home: Apartment Home Access: Level entry Home Layout: One level Bathroom Shower/Tub: Engineer, manufacturing systemsTub/shower unit Bathroom Toilet: Standard Bathroom Accessibility: Yes Home Equipment: Environmental consultantWalker - 4 wheels, Wheelchair - manual, Shower seat  Lives With: Family  Functional History: Prior Function Level of Independence: Needs assistance Gait / Transfers Assistance Needed: Occasional assist from sister for squat pivot transfers but is mod indep majority of time. ADL's / Homemaking Assistance Needed: Pt dresses herself. Reliant on assist from sister for bathing and other ADLs Functional Status:  Mobility: Bed Mobility Overal bed mobility: Needs Assistance Bed Mobility: Supine to Sit, Sit to Supine Supine to sit: Mod assist, +2 for physical assistance Sit to supine: Min assist, +2 for physical assistance General bed mobility comments: Moved very slowly with Mod assist of 2 throughout the process of getting to EOB; good use of rail to help pull self to the EOB Transfers Overall transfer level: Needs assistance Equipment used: 2 person hand held assist, Rolling walker (2 wheeled)(and 3rd person in the wedge) Transfers: Sit to/from Stand Sit to Stand: Mod assist, Min assist Squat pivot transfers: Max assist General transfer comment: Blocked knee on R for stability and pt required Min-Mod A to power into standing from elevated surface. Ambulation/Gait General Gait Details: unable per pt    ADL: ADL Overall ADL's : Needs assistance/impaired Eating/Feeding: Set up, Sitting Grooming: Min guard, Sitting Upper Body Bathing: Sitting, Set up,  Supervision/ safety Lower Body Bathing: Sitting/lateral leans, Maximal assistance Upper Body Dressing : Minimal assistance, Sitting Lower Body Dressing: Sit to/from stand, Maximal assistance Toilet Transfer: Moderate assistance, Maximal assistance, BSC, Squat-pivot Toilet Transfer Details (indicate cue type and reason): Mod assist to transfer [facility] to [facility] and max assist to [facility] (transferring to left side going back to bed). Toileting- Clothing Manipulation and Hygiene: Minimal assistance, Sitting/lateral lean General ADL Comments: Pt declining ADLs at this time due to fatigue and pain. Pt continues to demosntrating decreased activity tolerance and sitting balance with need for UE support while sitting EOB  Cognition: Cognition Overall Cognitive Status: Impaired/Different from baseline Orientation Level: Oriented X4 Cognition Arousal/Alertness: Awake/alert Behavior During Therapy: WFL for tasks assessed/performed Overall Cognitive Status: Impaired/Different from baseline Area of Impairment: Safety/judgement, Following commands, Awareness Following Commands: Follows one step commands with increased time, Follows multi-step commands inconsistently, Follows multi-step commands with increased time Safety/Judgement: Decreased awareness of safety, Decreased awareness of deficits Awareness: Emergent General Comments: Pt with no memory of prior conversation on dc to CIR for further therapy.  Pt also requiring increased time and VCs for processing and performing mobility.  Blood pressure 134/68, pulse 72, temperature (!) 97.5 F (36.4 C), temperature source Oral, resp. rate 13, height 5\' 5"  (1.651 m), weight 94.7 kg (208 lb 12.4 oz), SpO2 97 %. Physical Exam  Vitals reviewed. Constitutional: She is oriented to person, place, and time. She appears well-developed.  Obese  HENT:  Head: Normocephalic and atraumatic.  Eyes: EOM are normal. Right eye exhibits no discharge. Left  eye exhibits no discharge.  Neck: Normal range of motion. Neck supple.  Cardiovascular: Normal rate and regular rhythm.  Respiratory: Effort normal and breath sounds normal.  GI: Soft. Bowel sounds are normal.  Musculoskeletal: She exhibits edema and tenderness.  Left AKA  Neurological: She is alert and oriented to person, place, and time.  Motor: B/l UE 5/5 proximal to distal RLE: 4+/5 proximal to distal LLE: HF 1/5 (pain inhibiiton)  Skin: Skin is warm and dry.  +VAC  Psychiatric: She has a normal mood and affect. Her behavior is normal.    Results for orders placed or performed during the hospital encounter of 08/23/17 (from the past 24 hour(s))  Glucose, capillary     Status: None   Collection Time: 08/29/17 12:28 PM  Result Value Ref Range   Glucose-Capillary 88 65 - 99 mg/dL  Vancomycin, trough     Status: None   Collection Time: 08/29/17  3:39 PM  Result Value Ref Range   Vancomycin Tr 15 15 - 20 ug/mL  Glucose, capillary     Status: Abnormal   Collection Time: 08/29/17  4:47 PM  Result Value Ref Range   Glucose-Capillary 191 (H) 65 - 99 mg/dL  Glucose, capillary     Status: Abnormal   Collection Time: 08/29/17  9:37 PM  Result Value Ref Range   Glucose-Capillary 161 (H) 65 - 99 mg/dL  Protime-INR     Status: None   Collection Time: 08/30/17  6:17 AM  Result Value Ref Range   Prothrombin Time 14.3 11.4 - 15.2 seconds   INR 1.12   Basic metabolic panel     Status: Abnormal   Collection Time: 08/30/17  6:17 AM  Result Value Ref Range   Sodium 136 135 - 145 mmol/L   Potassium 5.3 (H) 3.5 - 5.1 mmol/L   Chloride 98 (L) 101 - 111 mmol/L   CO2 28 22 - 32 mmol/L   Glucose, Bld 122 (H) 65 - 99 mg/dL   BUN 12 6 - 20 mg/dL   Creatinine, Ser 4.09 0.44 - 1.00 mg/dL   Calcium 9.1 8.9 - 81.1 mg/dL   GFR calc non Af Amer >60 >60 mL/min   GFR calc Af Amer >60 >60 mL/min   Anion gap 10 5 - 15  CBC     Status: Abnormal   Collection Time: 08/30/17  6:17 AM  Result Value Ref  Range   WBC 11.0 (H) 4.0 - 10.5 K/uL   RBC 3.23 (L) 3.87 - 5.11 MIL/uL   Hemoglobin 8.7 (L) 12.0 - 15.0 g/dL   HCT 91.4 (L) 78.2 - 95.6 %   MCV 86.7 78.0 - 100.0 fL   MCH 26.9 26.0 - 34.0 pg   MCHC 31.1 30.0 - 36.0 g/dL   RDW 21.3 08.6 - 57.8 %   Platelets 431 (H) 150 - 400 K/uL   No results found.  Assessment/Plan: Diagnosis: left AKA Labs independently reviewed.  Records reviewed and summated above. Clean amputation daily with soap and water  Monitor incision site for signs of infection or impending skin breakdown. Staples to remain in place for 3-4 weeks Stump shrinker, for edema control  Scar mobilization massaging to prevent soft tissue adherence Stump protector during therapies Prevent flexion contractures by implementing the following:   Encourage prone lying for 20-30 mins per day BID to avoid hip flexion  Contractures if medically appropriate;  Avoid prolonged sitting Post surgical pain control with oral medication Phantom limb pain control with physical modalities including desensitization techniques (gentle self massage to the residual stump,hot packs if sensation intact, US) and mirror therapy, TENS. If ineffective, consider pharmacological treatment for neuropathic pain (e.g gabapentin, pregabalin, amytriptalyine, duloxetine).  When using wheelchair, patient should have knee on amputated side fully extended with board under the seat cushion. Avoid injury to contralateral side  1. Does the need for close, 24 hr/day medical supervision in concert with the patient's rehab needs make it unreasonable for this patient to be served in a less intensive setting? Yes 2. Co-Morbidities requiring supervision/potential complications: OSA (cont CPAP, monitor for daytime somnolence), HTN (monitor and provide prns in accordance with increased physical exertion and pain), DM (Monitor in accordance with exercise and adjust meds as necessary), depression (ensure mood does not hinder progress  of therapies), hyperkalemia (cont to monitor, treat if necessary), leukocytosis (cont to monitor for signs and symptoms of infection, further workup if indicated), ABLA (transfuse if necessary to ensure appropriate perfusion for increased activity tolerance) 3. Due to bladder management, safety, skin/wound care, disease management, pain management and patient education, does the patient require 24 hr/day rehab nursing? Yes 4. Does the patient require coordinated care of a physician, rehab nurse, PT (1-2 hrs/day, 5 days/week) and OT (1-2 hrs/day, 5 days/week) to address physical and functional deficits in the context of the above medical diagnosis(es)? Yes Addressing deficits in the following areas: balance, endurance, locomotion, strength, transferring, bathing, dressing, toileting and psychosocial support 5. Can the patient actively participate in an intensive therapy program of at least 3 hrs of therapy per day at least 5 days per week? Once pain better controlled 6. The potential for patient to make measurable gains while on inpatient rehab is excellent 7. Anticipated functional outcomes upon discharge from inpatient rehab are supervision and min assist  with PT, supervision and min assist with OT, n/a with SLP. 8. Estimated rehab length of stay to reach the above functional goals is: 7-12 days. 9. Anticipated D/C setting: Home 10. Anticipated post D/C treatments: HH therapy and Home excercise program 11. Overall Rehab/Functional Prognosis: good  RECOMMENDATIONS: This patient's condition is appropriate for continued rehabilitative care in the following setting: Will need to await reeval by therapies, but likely recommend CIR once pain better controlled.  Patient has agreed to participate in recommended program. Yes Note that insurance prior authorization may be required for reimbursement for recommended care.  Comment: Rehab Admissions Coordinator to follow up.  Maryla MorrowAnkit Elaine Roanhorse, MD,  Evert KohlABPMR 08/30/2017

## 2017-08-30 NOTE — Anesthesia Procedure Notes (Signed)
Procedure Name: LMA Insertion Date/Time: 08/30/2017 8:21 AM Performed by: White, Cordella RegisterKelsey Tena Demarquez Ciolek, CRNA Pre-anesthesia Checklist: Patient identified, Emergency Drugs available, Suction available and Patient being monitored Patient Re-evaluated:Patient Re-evaluated prior to induction Oxygen Delivery Method: Circle System Utilized Preoxygenation: Pre-oxygenation with 100% oxygen Induction Type: IV induction Ventilation: Mask ventilation without difficulty LMA: LMA inserted LMA Size: 4.0 Number of attempts: 1 Airway Equipment and Method: Bite block Placement Confirmation: positive ETCO2 Tube secured with: Tape Dental Injury: Teeth and Oropharynx as per pre-operative assessment

## 2017-08-30 NOTE — Transfer of Care (Signed)
Immediate Anesthesia Transfer of Care Note  Patient: Danielle ClassJanice Levings  Procedure(s) Performed: REVISION AMPUTATION ABOVE KNEE (Left Leg Upper)  Patient Location: PACU  Anesthesia Type:General  Level of Consciousness: awake, alert  and patient cooperative  Airway & Oxygen Therapy: Patient Spontanous Breathing  Post-op Assessment: Report given to RN and Post -op Vital signs reviewed and stable  Post vital signs: Reviewed and stable  Last Vitals:  Vitals:   08/30/17 0523 08/30/17 0804  BP: 134/68   Pulse: 72   Resp: 13 13  Temp: (!) 36.4 C   SpO2: 98% 97%    Last Pain:  Vitals:   08/30/17 0804  TempSrc:   PainSc: 0-No pain      Patients Stated Pain Goal: 7 (08/28/17 1005)  Complications: No apparent anesthesia complications

## 2017-08-30 NOTE — Progress Notes (Signed)
PT Cancellation Note  Patient Details Name: Josefine ClassJanice Toren MRN: [id number] DOB: 05/02/1956   Cancelled Treatment:    Reason Eval/Treat Not Completed: Patient at procedure or test/unavailable. Pt off the floor for L stump revision. Acute PT to return as able when appropriate.   Griffin Gerrard M Terez Montee 08/30/2017, 7:48 AM   Lewis ShockAshly Gaynor Ferreras, PT, DPT Pager #: [phone] Office #: [phone]

## 2017-08-31 LAB — CBC
HCT: 27.6 % — ABNORMAL LOW (ref 36.0–46.0)
Hemoglobin: 8.3 g/dL — ABNORMAL LOW (ref 12.0–15.0)
MCH: 26.3 pg (ref 26.0–34.0)
MCHC: 30.1 g/dL (ref 30.0–36.0)
MCV: 87.3 fL (ref 78.0–100.0)
PLATELETS: 435 10*3/uL — AB (ref 150–400)
RBC: 3.16 MIL/uL — AB (ref 3.87–5.11)
RDW: 15.6 % — AB (ref 11.5–15.5)
WBC: 10.7 10*3/uL — ABNORMAL HIGH (ref 4.0–10.5)

## 2017-08-31 LAB — GLUCOSE, CAPILLARY
GLUCOSE-CAPILLARY: 98 mg/dL (ref 65–99)
Glucose-Capillary: 112 mg/dL — ABNORMAL HIGH (ref 65–99)
Glucose-Capillary: 158 mg/dL — ABNORMAL HIGH (ref 65–99)

## 2017-08-31 LAB — BASIC METABOLIC PANEL
Anion gap: 9 (ref 5–15)
BUN: 11 mg/dL (ref 6–20)
CALCIUM: 9.3 mg/dL (ref 8.9–10.3)
CO2: 27 mmol/L (ref 22–32)
CREATININE: 0.71 mg/dL (ref 0.44–1.00)
Chloride: 99 mmol/L — ABNORMAL LOW (ref 101–111)
GFR calc non Af Amer: >60 mL/min (ref >60)
Glucose, Bld: 98 mg/dL (ref 65–99)
Potassium: 4 mmol/L (ref 3.5–5.1)
SODIUM: 135 mmol/L (ref 135–145)

## 2017-08-31 NOTE — Progress Notes (Signed)
Occupational Therapy Treatment Patient Details Name: Danielle Buchanan MRN: [id number] DOB: 1955/11/11 Today's Date: 08/31/2017    History of present illness 61 y.o. female presents in follow-up from recent left revision of her left below-knee amputation, had significant drainage and pain, L AKA on 11/20, revision and placement of wound vac 11/23.   OT comments  Focus of session on bed mobility, sitting balance/tolerance at EOB and UB exercise. Pt tolerated well. Will continue to perform theraband exercises on her own.  Follow Up Recommendations  CIR    Equipment Recommendations  None recommended by OT    Recommendations for Other Services      Precautions / Restrictions Precautions Precautions: Fall Precaution Comments: L AKA, extremely painful       Mobility Bed Mobility Overal bed mobility: Needs Assistance Bed Mobility: Supine to Sit;Sit to Supine     Supine to sit: Max assist Sit to supine: Mod assist   General bed mobility comments: increased time, heavy reliance on rail, assist with pad for hips to EOB and to raise trunk  Transfers                      Balance Overall balance assessment: Needs assistance   Sitting balance-Leahy Scale: Poor Sitting balance - Comments: requires B UE support, attempting to unweight L LE                                   ADL either performed or assessed with clinical judgement   ADL                                         General ADL Comments: Pt requiring encouragement to participate in bed mobility and sitting at EOB. Immediately agreeable to bed level UE exercises with t-band.     Vision       Perception     Praxis      Cognition Arousal/Alertness: Awake/alert Behavior During Therapy: WFL for tasks assessed/performed Overall Cognitive Status: Impaired/Different from baseline Area of Impairment: Safety/judgement;Memory                     Memory: Decreased  short-term memory   Safety/Judgement: Decreased awareness of deficits     General Comments: pt with fear of falling, limiting participation        Exercises Exercises: General Upper Extremity General Exercises - Upper Extremity Shoulder Flexion: Strengthening;Both;15 reps;Supine;Theraband Theraband Level (Shoulder Flexion): Level 1 (Yellow) Shoulder Horizontal ABduction: Strengthening;Both;15 reps;Supine;Theraband Theraband Level (Shoulder Horizontal Abduction): Level 1 (Yellow) Elbow Flexion: Strengthening;Both;15 reps;Supine;Theraband Theraband Level (Elbow Flexion): Level 2 (Red) Elbow Extension: Strengthening;Both;15 reps;Supine;Theraband Theraband Level (Elbow Extension): Level 2 (Red) Digit Composite Flexion: Strengthening;10 reps;Supine;Squeeze ball;Both   Shoulder Instructions       General Comments      Pertinent Vitals/ Pain       Pain Assessment: Faces Faces Pain Scale: Hurts even more Pain Location: L LE Pain Descriptors / Indicators: Operative site guarding;Aching;Sore;Grimacing;Guarding Pain Intervention(s): Repositioned;PCA encouraged;Monitored during session;Limited activity within patient's tolerance  Home Living                                          Prior Functioning/Environment  Frequency  Min 3X/week        Progress Toward Goals  OT Goals(current goals can now be found in the care plan section)  Progress towards OT goals: Progressing toward goals  Acute Rehab OT Goals Patient Stated Goal: to decrease pain OT Goal Formulation: With patient Time For Goal Achievement: 09/07/17 Potential to Achieve Goals: Good  Plan Discharge plan remains appropriate    Co-evaluation                 AM-PAC PT "6 Clicks" Daily Activity     Outcome Measure   Help from another person eating meals?: None Help from another person taking care of personal grooming?: A Little Help from another person toileting, which  includes using toliet, bedpan, or urinal?: A Lot Help from another person bathing (including washing, rinsing, drying)?: A Lot Help from another person to put on and taking off regular upper body clothing?: A Little Help from another person to put on and taking off regular lower body clothing?: A Lot 6 Click Score: 16    End of Session Equipment Utilized During Treatment: Gait belt  OT Visit Diagnosis: Muscle weakness (generalized) (M62.81);Pain;Other symptoms and signs involving cognitive function Pain - Right/Left: Left Pain - part of body: Leg   Activity Tolerance No increased pain   Patient Left in bed;with call bell/phone within reach   Nurse Communication          Time: 1432-1500 OT Time Calculation (min): 28 min  Charges: OT General Charges $OT Visit: 1 Visit OT Treatments $Therapeutic Activity: 8-22 mins $Therapeutic Exercise: 8-22 mins  08/31/2017 [name], OTR/L Pager: [phone]   [name], [name] Lynn 08/31/2017, 3:07 PM

## 2017-08-31 NOTE — Progress Notes (Signed)
   VASCULAR SURGERY ASSESSMENT & PLAN:   1 Day Post-Op s/p: Revision of left above-the-knee amputation and placement of negative pressure dressing.  Back with good seal.  Dressing change on Monday.  SUBJECTIVE:   No complaints.  PHYSICAL EXAM:   Vitals:   08/30/17 2209 08/31/17 0245 08/31/17 0400 08/31/17 0513  BP: (!) 130/55 (!) 132/57  139/68  Pulse: 78 80  66  Resp: 16  12   Temp: 97.9 F (36.6 C) 98 F (36.7 C)  98.6 F (37 C)  TempSrc: Oral Oral  Oral  SpO2: 100% 95% 97% 93%  Weight:      Height:       VAC with good seal. Lungs clear bilaterally to auscultation.  LABS:   Lab Results  Component Value Date   WBC 10.7 (H) 08/31/2017   HGB 8.3 (L) 08/31/2017   HCT 27.6 (L) 08/31/2017   MCV 87.3 08/31/2017   PLT 435 (H) 08/31/2017   Lab Results  Component Value Date   CREATININE 0.71 08/31/2017   Lab Results  Component Value Date   INR 1.12 08/30/2017   CBG (last 3)  Recent Labs    08/30/17 1218 08/30/17 1730 08/30/17 2155  GLUCAP 255* 324* 173*    PROBLEM LIST:    Active Problems:   Amputation stump infection (HCC)   Amputation of left lower extremity above knee upon examination (HCC)   Unilateral AKA, left (HCC)   Post-operative pain   OSA (obstructive sleep apnea)   Benign essential HTN   Diabetes mellitus type 2 in obese (HCC)   Hyperkalemia   Leukocytosis   Acute blood loss anemia   CURRENT MEDS:   . amitriptyline  25 mg Oral QHS  . collagenase  1 application Topical Daily  . collagenase   Topical Daily  . docusate sodium  100 mg Oral Daily  . heparin  5,000 Units Subcutaneous Q8H  . insulin aspart  0-15 Units Subcutaneous TID WC  . insulin aspart protamine- aspart  70 Units Subcutaneous BID WC  . loratadine  10 mg Oral Daily  . losartan  100 mg Oral Daily  . Melatonin  3 mg Oral QHS  . morphine   Intravenous Q4H  . multivitamin with minerals  1 tablet Oral Daily  . nicotine  14 mg Transdermal Daily  . pantoprazole  40 mg  Oral Daily  . pravastatin  20 mg Oral QHS  . pregabalin  75 mg Oral BID  . protein supplement shake  2 oz Oral TID BM  . sertraline  150 mg Oral Daily  . traZODone  50 mg Oral QHS    Waverly FerrariChristopher Gearold Wainer Beeper: [phone] Office: [phone]

## 2017-09-01 LAB — CBC
HEMATOCRIT: 28.5 % — AB (ref 36.0–46.0)
HEMOGLOBIN: 8.6 g/dL — AB (ref 12.0–15.0)
MCH: 26.1 pg (ref 26.0–34.0)
MCHC: 30.2 g/dL (ref 30.0–36.0)
MCV: 86.6 fL (ref 78.0–100.0)
Platelets: 411 10*3/uL — ABNORMAL HIGH (ref 150–400)
RBC: 3.29 MIL/uL — ABNORMAL LOW (ref 3.87–5.11)
RDW: 15.5 % (ref 11.5–15.5)
WBC: 9.3 10*3/uL (ref 4.0–10.5)

## 2017-09-01 LAB — BASIC METABOLIC PANEL
ANION GAP: 11 (ref 5–15)
BUN: 12 mg/dL (ref 6–20)
CALCIUM: 9.2 mg/dL (ref 8.9–10.3)
CO2: 25 mmol/L (ref 22–32)
Chloride: 97 mmol/L — ABNORMAL LOW (ref 101–111)
Creatinine, Ser: 0.72 mg/dL (ref 0.44–1.00)
GFR calc Af Amer: >60 mL/min (ref >60)
GFR calc non Af Amer: >60 mL/min (ref >60)
GLUCOSE: 118 mg/dL — AB (ref 65–99)
POTASSIUM: 3.9 mmol/L (ref 3.5–5.1)
Sodium: 133 mmol/L — ABNORMAL LOW (ref 135–145)

## 2017-09-01 LAB — GLUCOSE, CAPILLARY
Glucose-Capillary: 115 mg/dL — ABNORMAL HIGH (ref 65–99)
Glucose-Capillary: 137 mg/dL — ABNORMAL HIGH (ref 65–99)
Glucose-Capillary: 224 mg/dL — ABNORMAL HIGH (ref 65–99)
Glucose-Capillary: 148 mg/dL — ABNORMAL HIGH (ref 65–99)

## 2017-09-01 NOTE — Progress Notes (Signed)
Pharmacy Antibiotic Note  Danielle Buchanan is a 61 y.o. female admitted on 08/23/2017 with wound infection.  Pharmacy managing Zosyn and vancomycin dosing.   S/p AKA on 11/20 but with copious amounts of pus drained and were not able to get all of infected tissue per op note.  This is day # 10 of vancomycin and Zosyn for wound infection. No cultures sent. Wound vac in place. WBC now within normal limits. Afebrile. No signs of infection per MD progress note.   Plan: Continue Zosyn 3.375g IV q8h EI Continue vancomycin 1g IV q12h Monitor clinical picture, renal function, VT prn  *MD- Please address length of therapy  Height: 5\' 5"  (165.1 cm) Weight: 208 lb 12.4 oz (94.7 kg) IBW/kg (Calculated) : 57  Temp (24hrs), Avg:98.5 F (36.9 C), Min:98.2 F (36.8 C), Max:98.7 F (37.1 C)  Recent Labs  Lab 08/25/17 1245 08/27/17 0601 08/28/17 0753 08/29/17 1539 08/30/17 0617 08/31/17 0516 09/01/17 0702  WBC  --  11.1*  --   --  11.0* 10.7* 9.3  CREATININE  --  0.71 0.74  --  0.78 0.71 0.72  VANCOTROUGH 19  --   --  15  --   --   --     Estimated Creatinine Clearance: 84.1 mL/min (by C-G formula based on SCr of 0.72 mg/dL).    Allergies  Allergen Reactions  . Doxycycline Rash    Antimicrobials this admission: Vanc 11/16 >>  Zosyn 11/16 >>   Dose adjustments this admission: 11/18 VT = 19 on 1250 mg IV q24h 11/22 VT = 18 on 1g IV q12h  Microbiology results: No cxs  Thank you for allowing pharmacy to be a part of this patient's care.  Link SnufferJessica Valincia Touch, PharmD, BCPS, BCCCP Clinical Pharmacist Clinical phone 09/01/2017 until 3:30PM [phone]- [phone] After hours, please call #[phone] 09/01/2017 10:00 AM

## 2017-09-01 NOTE — Progress Notes (Signed)
   VASCULAR SURGERY ASSESSMENT & PLAN:   2 Days Post-Op s/p: Revision of left above-the-knee amputation and placement of a VAC.   No signs of infection.  She is afebrile and her white blood cell count is 10.7.  VAC change tomorrow.  SUBJECTIVE:   No specific complaints.  PHYSICAL EXAM:   Vitals:   09/01/17 0055 09/01/17 0228 09/01/17 0443 09/01/17 0500  BP:  123/63  123/60  Pulse:  81  76  Resp: 18 16 14 16   Temp:  98.7 F (37.1 C)  98.7 F (37.1 C)  TempSrc:  Oral  Oral  SpO2: 96% 96% 96% 97%  Weight:      Height:       VAC with good seal.  LABS:   Lab Results  Component Value Date   WBC 10.7 (H) 08/31/2017   HGB 8.3 (L) 08/31/2017   HCT 27.6 (L) 08/31/2017   MCV 87.3 08/31/2017   PLT 435 (H) 08/31/2017   Lab Results  Component Value Date   CREATININE 0.71 08/31/2017   Lab Results  Component Value Date   INR 1.12 08/30/2017   CBG (last 3)  Recent Labs    08/31/17 0749 08/31/17 1226 08/31/17 1705  GLUCAP 98 112* 158*    PROBLEM LIST:    Active Problems:   Amputation stump infection (HCC)   Amputation of left lower extremity above knee upon examination (HCC)   Unilateral AKA, left (HCC)   Post-operative pain   OSA (obstructive sleep apnea)   Benign essential HTN   Diabetes mellitus type 2 in obese (HCC)   Hyperkalemia   Leukocytosis   Acute blood loss anemia   CURRENT MEDS:   . amitriptyline  25 mg Oral QHS  . collagenase  1 application Topical Daily  . collagenase   Topical Daily  . docusate sodium  100 mg Oral Daily  . heparin  5,000 Units Subcutaneous Q8H  . insulin aspart  0-15 Units Subcutaneous TID WC  . insulin aspart protamine- aspart  70 Units Subcutaneous BID WC  . loratadine  10 mg Oral Daily  . losartan  100 mg Oral Daily  . Melatonin  3 mg Oral QHS  . morphine   Intravenous Q4H  . multivitamin with minerals  1 tablet Oral Daily  . nicotine  14 mg Transdermal Daily  . pantoprazole  40 mg Oral Daily  . pravastatin  20  mg Oral QHS  . pregabalin  75 mg Oral BID  . protein supplement shake  2 oz Oral TID BM  . sertraline  150 mg Oral Daily  . traZODone  50 mg Oral QHS    Danielle FerrariChristopher Buchanan Beeper: [phone] Office: [phone] 09/01/2017

## 2017-09-02 ENCOUNTER — Encounter (HOSPITAL_COMMUNITY): Payer: Self-pay | Admitting: Surgery

## 2017-09-02 LAB — GLUCOSE, CAPILLARY
GLUCOSE-CAPILLARY: 189 mg/dL — AB (ref 65–99)
GLUCOSE-CAPILLARY: 118 mg/dL — AB (ref 65–99)
GLUCOSE-CAPILLARY: 142 mg/dL — AB (ref 65–99)
Glucose-Capillary: 73 mg/dL (ref 65–99)
Glucose-Capillary: 209 mg/dL — ABNORMAL HIGH (ref 65–99)

## 2017-09-02 MED ORDER — SULFAMETHOXAZOLE-TRIMETHOPRIM 800-160 MG PO TABS
1.0000 | ORAL_TABLET | Freq: Two times a day (BID) | ORAL | Status: DC
  Administered 2017-09-02 – 2017-09-03 (×2): 1 via ORAL
  Filled 2017-09-02 (×2): qty 1

## 2017-09-02 MED ORDER — MORPHINE SULFATE (PF) 4 MG/ML IV SOLN
1.0000 mg | INTRAVENOUS | Status: DC | PRN
  Administered 2017-09-02: 1 mg via INTRAVENOUS
  Filled 2017-09-02: qty 1

## 2017-09-02 MED ORDER — OXYCODONE-ACETAMINOPHEN 5-325 MG PO TABS
1.0000 | ORAL_TABLET | Freq: Four times a day (QID) | ORAL | Status: DC | PRN
  Administered 2017-09-02 (×3): 1 via ORAL
  Filled 2017-09-02: qty 2
  Filled 2017-09-02 (×2): qty 1
  Filled 2017-09-02: qty 2
  Filled 2017-09-02: qty 1

## 2017-09-02 NOTE — Progress Notes (Signed)
Morphine 22mg  wasted from PCA syringe into sink. Alferd ApaJoan Kiang, RN witnessed.

## 2017-09-02 NOTE — Progress Notes (Signed)
Rehab admissions - Per case manager, patient wants to DC home when medically ready.  I will not pursue inpatient rehab admission at this time.  Call me for questions.  #[phone]

## 2017-09-02 NOTE — Care Management Important Message (Signed)
Important Message  Patient Details  Name: Danielle Buchanan MRN: [id number] Date of Birth: September 29, 1956   Medicare Important Message Given:  Yes    [name] 09/02/2017, 10:59 AM

## 2017-09-02 NOTE — Care Management Note (Addendum)
Case Management Note  Patient Details  Name: Danielle Buchanan MRN: [id number] Date of Birth: 05/24/1956  Subjective/Objective:                    Action/Plan: Ordered Tub/shower bench;Other [facility]comment)([facility]. [facility] 09-03-17   Spoke to patient at bedside. She is from home with her sister. She already has wheelchair and walker and bedside commode at home. Patient currently on oxygen in hospital and not at home. Bedside nurse will attempt to wean. If unable to wean will need order for home oxygen.   [facility]. Discussed potential CIR and patient states she wants to go home at discharge. Patient states sister will transport her home at discharge.   Called Dr Darc[facility]ain[facility], spoke to Danielle Buchanan who will call Danielle Buchanan. Await call back. If [facility] negative pressure system needed will need form signed and wound measurements.   Will need home health orders and face to face . Thanks     Expected Discharge Date:                  Expected Discharge Plan:  Home w Home Health Services  In-House Referral:  Clinical Social Work  Discharge planning Services  CM Consult  Post Acute Care Choice:  Home Health Choice offered to:  Patient  DME Arranged:    DME Agency:     HH Arranged:    HH Agency:  [facility]  Status of Service:  In process, will continue to follow  If discussed at Long Length of Stay Meetings, dates discussed:    Additional Comments:  Kingsley PlanWile, Danielle Buchanan Marie, RN 09/02/2017, 12:34 PM

## 2017-09-02 NOTE — Progress Notes (Addendum)
  Progress Note    09/02/2017 8:26 AM 3 Days Post-Op  Subjective:  Am I going home today?  Afebrile HR 70's-80's  130's-150's systolic 94% 1LO2NC (on O2 for PCA)  Vitals:   09/02/17 0602 09/02/17 0811  BP: (!) 158/69   Pulse: 71   Resp: 13 17  Temp: 98.3 F (36.8 C)   SpO2: 96% 94%    Physical Exam: Lungs:  Non labored Incisions:  Wound vac with good seal Extremities:  Stump looks good without erythema or pain to palpation   CBC    Component Value Date/Time   WBC 9.3 09/01/2017 0702   RBC 3.29 (L) 09/01/2017 0702   HGB 8.6 (L) 09/01/2017 0702   HCT 28.5 (L) 09/01/2017 0702   PLT 411 (H) 09/01/2017 0702   MCV 86.6 09/01/2017 0702   MCH 26.1 09/01/2017 0702   MCHC 30.2 09/01/2017 0702   RDW 15.5 09/01/2017 0702   LYMPHSABS 2.6 08/23/2017 1326   MONOABS 0.9 08/23/2017 1326   EOSABS 0.2 08/23/2017 1326   BASOSABS 0.2 (H) 08/23/2017 1326    BMET    Component Value Date/Time   NA 133 (L) 09/01/2017 0702   K 3.9 09/01/2017 0702   CL 97 (L) 09/01/2017 0702   CO2 25 09/01/2017 0702   GLUCOSE 118 (H) 09/01/2017 0702   BUN 12 09/01/2017 0702   CREATININE 0.72 09/01/2017 0702   CALCIUM 9.2 09/01/2017 0702   GFRNONAA >60 09/01/2017 0702   GFRAA >60 09/01/2017 0702    INR    Component Value Date/Time   INR 1.12 08/30/2017 0617     Intake/Output Summary (Last 24 hours) at 09/02/2017 0826 Last data filed at 09/02/2017 0604 Gross per 24 hour  Intake 240 ml  Output 800 ml  Net -560 ml     Assessment:  61 y.o. female is s/p:  Left AKA with wound vac placement 6 Days Post-Op and Procedure:   #1: Pulse lavage irrigation of the left above-knee amputation                         #2: Placement of antibiotic impregnated beads                         #3 fascial closure of above-knee amputation                         #4: Application of wound VAC 3 Days Post-Op  Plan: -pt's pain much improved -will turn off PCA for now and start po pain meds-will give  a dose of IV pain med before wound vac change -Dr. Randie Heinzain to evaluate wound later this morning.    Doreatha MassedSamantha Rhyne, PA-C [facility] [phone] 09/02/2017 8:26 AM  I have independently interviewed and examined patient and agree with PA assessment and plan above. Wound is healing without signs of infection. Transition to po abx for home for another week. Will need to continue santyl to her right foot and will see her in 2 weeks upon discharge. She would likely benefit from intervention of her right posterior tibial artery in the near future to prevent more than simple toe amputation.    Vearl Allbaugh C. Randie Heinzain, MD [facility] [place] Office: [phone] Pager: [phone]

## 2017-09-02 NOTE — Progress Notes (Signed)
Wasted 11 mls of 2mg s Morphine

## 2017-09-02 NOTE — Progress Notes (Signed)
Physical Therapy Treatment Patient Details Name: Danielle Buchanan ClassJanice Aceituno MRN: [id number] DOB: 1956-01-13 Today's Date: 09/02/2017    History of Present Illness 61 y.o. female presents in follow-up from recent left revision of her left below-knee amputation, had significant drainage and pain, L AKA on 11/20, revision and placement of wound vac 11/23.    PT Comments    Pt was seen for evaluation of her mobility today and noted vast improvement in pain and standing control.  Her attitude is brighter and demonstrates a better control of her mobility and expectations are better.  PT is still recommending CIR as pt has help at home and expects to try to get there right after rehab.  Will continue on with all mobility as tolerated and may try to have her step next visit if able.   Follow Up Recommendations  CIR     Equipment Recommendations  None recommended by PT    Recommendations for Other Services Rehab consult     Precautions / Restrictions Precautions Precautions: Fall Precaution Comments: L AKA Restrictions Weight Bearing Restrictions: Yes LLE Weight Bearing: Non weight bearing Other Position/Activity Restrictions: Keep L stump elevated as needed    Mobility  Bed Mobility Overal bed mobility: Needs Assistance Bed Mobility: Supine to Sit Rolling: Min guard Sidelying to sit: Min assist Supine to sit: Min assist     General bed mobility comments: assisted trunk and then pt assisted to scoot, used bed pad to avoid pain to L stump  Transfers Overall transfer level: Needs assistance Equipment used: 2 person hand held assist Transfers: Sit to/from [employer] CorporationStand;Stand Pivot Transfers Sit to Stand: Min assist;+2 physical assistance Stand pivot transfers: Min assist;+2 physical assistance;+2 safety/equipment;From elevated surface       General transfer comment: SPT BSC  Ambulation/Gait             General Gait Details: unable to hop yet   Stairs            Wheelchair  Mobility    Modified Rankin (Stroke Patients Only)       Balance Overall balance assessment: Needs assistance Sitting-balance support: Feet supported Sitting balance-Leahy Scale: Fair Sitting balance - Comments: UE support helpful as pt leans back slightly at times wihtout outright loss of balance Postural control: Posterior lean Standing balance support: Bilateral upper extremity supported;During functional activity Standing balance-Leahy Scale: Poor                              Cognition Arousal/Alertness: Awake/alert Behavior During Therapy: WFL for tasks assessed/performed Overall Cognitive Status: Within Functional Limits for tasks assessed                                        Exercises Amputee Exercises Quad Sets: AROM;Both;5 reps Knee Flexion: Strengthening;Right;10 reps Knee Extension: Strengthening;Right;10 reps    General Comments        Pertinent Vitals/Pain Pain Assessment: No/denies pain Pain Score: 3  Pain Location: L LE Pain Descriptors / Indicators: Aching;Sore Pain Intervention(s): Monitored during session;Premedicated before session;Repositioned    Home Living                      Prior Function            PT Goals (current goals can now be found in the care plan section) Acute Rehab PT Goals  Patient Stated Goal: to go home Progress towards PT goals: Progressing toward goals    Frequency    Min 3X/week      PT Plan Current plan remains appropriate    Co-evaluation              AM-PAC PT "6 Clicks" Daily Activity  Outcome Measure  Difficulty turning over in bed (including adjusting bedclothes, sheets and blankets)?: A Little Difficulty moving from lying on back to sitting on the side of the bed? : Unable Difficulty sitting down on and standing up from a chair with arms (e.g., wheelchair, bedside commode, etc,.)?: Unable Help needed moving to and from a bed to chair (including a  wheelchair)?: A Lot Help needed walking in hospital room?: Total Help needed climbing 3-5 steps with a railing? : Total 6 Click Score: 9    End of Session Equipment Utilized During Treatment: Gait belt Activity Tolerance: Patient limited by pain Patient left: in chair;with call bell/phone within reach;with chair alarm set;with nursing/sitter in room Nurse Communication: Mobility status PT Visit Diagnosis: Unsteadiness on feet (R26.81);Other abnormalities of gait and mobility (R26.89);Muscle weakness (generalized) (M62.81);Pain Pain - Right/Left: Left Pain - part of body: Leg     Time: 1022-1049 PT Time Calculation (min) (ACUTE ONLY): 27 min  Charges:  $Therapeutic Exercise: 8-22 mins $Therapeutic Activity: 8-22 mins                    G Codes:  Functional Assessment Tool Used: AM-PAC 6 Clicks Basic Mobility    Ivar DrapeRuth E Mayleigh Tetrault 09/02/2017, 3:47 PM   Samul Dadauth Zaccheus Edmister, PT MS Acute Rehab Dept. Number: [facility] [phone] and [facility] [phone]

## 2017-09-02 NOTE — Progress Notes (Signed)
Occupational Therapy Treatment Patient Details Name: Danielle Buchanan MRN: [id number] DOB: 11-20-1955 Today's Date: 09/02/2017    History of present illness 61 y.o. female presents in follow-up from recent left revision of her left below-knee amputation, had significant drainage and pain, L AKA on 11/20, revision and placement of wound vac 11/23.   OT comments  Pt making progress with functional goals. Pt states that she wants to d/c home with 24 hour assist from her sister vs CIR  Follow Up Recommendations  (pt states that she wants to d/c home with 24 hour assist from her sister vs CIR)    Equipment Recommendations  Tub/shower bench;Other (comment)(reacher)    Recommendations for Other Services      Precautions / Restrictions Precautions Precautions: Fall Precaution Comments: L AKA Restrictions Weight Bearing Restrictions: Yes LLE Weight Bearing: Non weight bearing       Mobility Bed Mobility               General bed mobility comments: pt up in recliner upon OT arrival  Transfers Overall transfer level: Needs assistance Equipment used: 1 person hand held assist;Rolling walker (2 wheeled) Transfers: Sit to/from [employer]Stand;Stand Pivot Transfers Sit to Stand: Min assist Stand pivot transfers: Min assist       General transfer comment: SPT BSC    Balance Overall balance assessment: Needs assistance Sitting-balance support: Feet supported Sitting balance-Leahy Scale: Fair   Postural control: Posterior lean Standing balance support: During functional activity;Bilateral upper extremity supported Standing balance-Leahy Scale: Poor                             ADL either performed or assessed with clinical judgement   ADL Overall ADL's : Needs assistance/impaired     Grooming: Sitting;Set up;Supervision/safety           Upper Body Dressing : Sitting;Min guard   Lower Body Dressing: Sit to/from stand;Moderate assistance;Sitting/lateral leans    Toilet Transfer: BSC;Minimal assistance;Stand-pivot;Cueing for sequencing;Cueing for safety;RW                   Vision Patient Visual Report: No change from baseline Vision Assessment?: No apparent visual deficits   Perception     Praxis      Cognition Arousal/Alertness: Awake/alert Behavior During Therapy: WFL for tasks assessed/performed Overall Cognitive Status: Impaired/Different from baseline                                          Exercises     Shoulder Instructions       General Comments  pt very pleasant and cooperative    Pertinent Vitals/ Pain       Pain Assessment: 0-10 Pain Score: 3  Pain Location: L LE Pain Descriptors / Indicators: Aching;Sore Pain Intervention(s): Monitored during session;Premedicated before session;Repositioned  Home Living                                          Prior Functioning/Environment              Frequency  Min 3X/week        Progress Toward Goals  OT Goals(current goals can now be found in the care plan section)  Progress towards OT goals: Progressing toward goals  Acute Rehab OT Goals Patient Stated Goal: go home instead of rehab  Plan Discharge plan remains appropriate    Co-evaluation                 AM-PAC PT "6 Clicks" Daily Activity     Outcome Measure                    End of Session Equipment Utilized During Treatment: Gait belt;Other (comment)(BSC)  OT Visit Diagnosis: Muscle weakness (generalized) (M62.81);Pain;Other symptoms and signs involving cognitive function;Unsteadiness on feet (R26.81) Pain - Right/Left: Left Pain - part of body: Leg   Activity Tolerance Patient tolerated treatment well   Patient Left with call bell/phone within reach;in chair   Nurse Communication      Functional Assessment Tool Used: AM-PAC 6 Clicks Daily Activity   Time: [id number] OT Time Calculation (min): 25 min  Charges: OT G-codes **NOT  FOR INPATIENT CLASS** Functional Assessment Tool Used: AM-PAC 6 Clicks Daily Activity OT General Charges $OT Visit: 1 Visit OT Treatments $Self Care/Home Management : 8-22 mins $Therapeutic Activity: 8-22 mins     Galen ManilaSpencer, Raeonna Milo Jeanette 09/02/2017, 1:22 PM

## 2017-09-03 ENCOUNTER — Telehealth: Payer: Self-pay | Admitting: Vascular Surgery

## 2017-09-03 ENCOUNTER — Other Ambulatory Visit: Payer: Self-pay

## 2017-09-03 LAB — BASIC METABOLIC PANEL
Anion gap: 9 (ref 5–15)
BUN: 11 mg/dL (ref 6–20)
CO2: 26 mmol/L (ref 22–32)
CREATININE: 0.8 mg/dL (ref 0.44–1.00)
Calcium: 9.5 mg/dL (ref 8.9–10.3)
Chloride: 102 mmol/L (ref 101–111)
Glucose, Bld: 121 mg/dL — ABNORMAL HIGH (ref 65–99)
POTASSIUM: 4.1 mmol/L (ref 3.5–5.1)
SODIUM: 137 mmol/L (ref 135–145)

## 2017-09-03 LAB — GLUCOSE, CAPILLARY
GLUCOSE-CAPILLARY: 76 mg/dL (ref 65–99)
Glucose-Capillary: 138 mg/dL — ABNORMAL HIGH (ref 65–99)

## 2017-09-03 MED ORDER — SULFAMETHOXAZOLE-TRIMETHOPRIM 800-160 MG PO TABS: 1.0000 | ORAL_TABLET | Freq: Two times a day (BID) | ORAL | 0 refills | Status: DC

## 2017-09-03 MED ORDER — OXYCODONE-ACETAMINOPHEN 5-325 MG PO TABS: 1.0000 | ORAL_TABLET | Freq: Four times a day (QID) | ORAL | 0 refills | Status: DC | PRN

## 2017-09-03 NOTE — Care Management Note (Signed)
Case Management Note  Patient Details  Name: Danielle Buchanan MRN: [id number] Date of Birth: 1956-04-16  Subjective/Objective:                    Action/Plan:  Called [facility] aware discharge is today . [facility] and bring shower bench. Patient does want to go home , but requesting a list of SNF's. Elease Hashimotoatricia with SW aware and will provide same. Expected Discharge Date:  09/03/17               Expected Discharge Plan:  Home w Home Health Services  In-House Referral:  Clinical Social Work  Discharge planning Services  CM Consult  Post Acute Care Choice:  Home Health Choice offered to:  Patient  DME Arranged:  Vac DME Agency:  [facility]  HH Arranged:  RN, PT, OT [facility]  [facility]  Status of Service:  Completed, signed off  If discussed at Long Length of Stay Meetings, dates discussed:    Additional Comments:  Kingsley PlanWile, Sammie Denner Marie, RN 09/03/2017, 10:23 AM

## 2017-09-03 NOTE — Consult Note (Signed)
   [facility]   09/03/2017  Josefine ClassJanice Prezioso [birth year] [id number]  Came by to see the patient and follow up with patient's disposition.  Patient states she is going home with home health care and her sister.  Explained [facility] services for post hospital follow up and needs.  She states that her nephew is her POA and he provides her the transportation to appointments.  She denies any issues with medications needs.  Explained that [facility] social worker and nursing could be of assistance if she needed a higher level of care if she got home and her transition wasn't working as she planned.  Patient is agreeable to [facility].  She states she felt that she did not need additional services for her ADLs.  She states she cannot shower with the wound VAC and her sister provides the meals. [facility] staff will follow up for transition of care needs.  Primary Care Provider is Shirlean Mylararol Webb  Charlesetta ShanksVictoria Marylee Belzer, RN BSN CCM [facility] Liaison  [phone] business mobile phone Toll free office [phone]

## 2017-09-03 NOTE — Telephone Encounter (Signed)
Sched appt 09/20/17 at 3:45. Spoke to sister.

## 2017-09-03 NOTE — Progress Notes (Signed)
  Progress Note    09/03/2017 9:58 AM 4 Days Post-Op  Subjective:  No acute issues  Vitals:   09/02/17 2104 09/03/17 0540  BP: 138/68 137/67  Pulse: 85 80  Resp: 20 17  Temp: 97.9 F (36.6 C) 98.6 F (37 C)  SpO2: 97% 94%    Physical Exam: Left aka wound vac to suction Right foot dressing cdi  CBC    Component Value Date/Time   WBC 9.3 09/01/2017 0702   RBC 3.29 (L) 09/01/2017 0702   HGB 8.6 (L) 09/01/2017 0702   HCT 28.5 (L) 09/01/2017 0702   PLT 411 (H) 09/01/2017 0702   MCV 86.6 09/01/2017 0702   MCH 26.1 09/01/2017 0702   MCHC 30.2 09/01/2017 0702   RDW 15.5 09/01/2017 0702   LYMPHSABS 2.6 08/23/2017 1326   MONOABS 0.9 08/23/2017 1326   EOSABS 0.2 08/23/2017 1326   BASOSABS 0.2 (H) 08/23/2017 1326    BMET    Component Value Date/Time   NA 137 09/03/2017 0534   K 4.1 09/03/2017 0534   CL 102 09/03/2017 0534   CO2 26 09/03/2017 0534   GLUCOSE 121 (H) 09/03/2017 0534   BUN 11 09/03/2017 0534   CREATININE 0.80 09/03/2017 0534   CALCIUM 9.5 09/03/2017 0534   GFRNONAA >60 09/03/2017 0534   GFRAA >60 09/03/2017 0534    INR    Component Value Date/Time   INR 1.12 08/30/2017 0617     Intake/Output Summary (Last 24 hours) at 09/03/2017 0958 Last data filed at 09/03/2017 0913 Gross per 24 hour  Intake 380 ml  Output 600 ml  Net -220 ml     Assessment:  61 y.o. female is left aka   Plan: Dc when wound vac set up F/u in 2 weeks, continue abx for 1 week Will need to set up rle angiogram to attempt limb salvage or right foot   Dayne Chait C. Randie Heinzain, MD [facility] [place] Office: [phone] Pager: [phone]  09/03/2017 9:58 AM

## 2017-09-03 NOTE — Telephone Encounter (Signed)
-----   Message from Sharee PimpleMarilyn K McChesney, RN sent at 09/03/2017  8:38 AM EST ----- Regarding: 2 weeks with Dr. Randie Heinzain for wound evaluation   ----- Message ----- From: Dara Lordshyne, Samantha J, PA-C Sent: 09/03/2017   8:26 AM To: Vvs Charge Pool  S/p left AKA and subsequent wash out.  Dr. Randie Heinzain wants to see pt in 2 weeks to evaluate wound.   Thanks

## 2017-09-03 NOTE — Progress Notes (Signed)
Rehab admissions - I met with patient this am.  She is up in chair and is very alert today.  She wants to discharge home directly with her sister and not come to rehab.  She says she already missed Thanksgiving and she wants to go home.  She did ask for a listing of SNF facilities in case it does not go well with her sister at home.  Case manager made aware.  Call me for questions.  #[phone]

## 2017-09-03 NOTE — Discharge Summary (Addendum)
Discharge Summary    Danielle Buchanan 04-30-1956 61 y.o. female  [id number]  Admission Date: 08/23/2017  Discharge Date: 09/03/17  Physician: Juventino Slovak*  Admission Diagnosis: infected lt bka stump   HPI:   This is a 61 y.o. female presents in follow-up from recent left revision of her left below-knee amputation. She now has had significant drainage and pain. She does not have any fevers or chills. She is not on antibiotics. She is followed by wound care for her right fourth and fifth toe ulceration.  Hospital Course:  The patient was admitted to the hospital due to breakdown of her left BKA stump with purulent drainage and started on IV abx with plans to convert to left AKA.    On 08/27/17 the pt was taken to the operating room on 08/30/2017 and underwent: Left above knee amputation with wound vac placement.     Findings: the underlying muscle was healthy and there is bleeding from the superficial femoral artery was clamped. There was unfortunately pus tracking posteriorly up the thigh and we could not revise the above-knee amputation to exclude the entire cavity. the wound was copiously irrigated and the bone was transected several centimeters higher than the initial transection point. A wound VAC was placed and she will need to be taken back to the operating room in 48-72 hours for evaluation of the wound.  The pt tolerated the procedure well and was transported to the PACU in good condition.   On POD 1, her pain was not well controlled.   Her po pain medication was discontinued and she was started on a PCA, which helped improve her pain.  Her IV abx were continued.   On 08/30/17, she was taken back to the operating room and underwent: Procedure:   #1: Pulse lavage irrigation of the left above-knee amputation                         #2: Placement of antibiotic impregnated beads                         #3 fascial closure of above-knee amputation            #4: Application of wound VAC  Findings: The muscle and tissue all looked very healthy.  There was a tunnel on the posterior aspect of the incision that tracks proximally.  No gross infection was identified however there was dirty appearing fluid which ultimately led me to closed the incision with a wound VAC rather than staples  She did fine over the weekend.  Wound vac kept a good seal.   On 09/02/17, [name] removed the wound vac and the wound is healing without signs of infection.  She was transitioned to po abx as well as po pain medication.  She has tolerated this well.  On 09/03/17, she will discharge home once her HH needs have been arranged.  She will be discharged home on Bactrim for 7 days and f/u with [name] in 2 weeks. The remainder of the hospital course consisted of increasing mobilization and increasing intake of solids without difficulty.  CBC    Component Value Date/Time   WBC 9.3 09/01/2017 0702   RBC 3.29 (L) 09/01/2017 0702   HGB 8.6 (L) 09/01/2017 0702   HCT 28.5 (L) 09/01/2017 0702   PLT 411 (H) 09/01/2017 0702   MCV 86.6 09/01/2017 0702   MCH 26.1 09/01/2017 4098  MCHC 30.2 09/01/2017 0702   RDW 15.5 09/01/2017 0702   LYMPHSABS 2.6 08/23/2017 1326   MONOABS 0.9 08/23/2017 1326   EOSABS 0.2 08/23/2017 1326   BASOSABS 0.2 (H) 08/23/2017 1326    BMET    Component Value Date/Time   NA 137 09/03/2017 0534   K 4.1 09/03/2017 0534   CL 102 09/03/2017 0534   CO2 26 09/03/2017 0534   GLUCOSE 121 (H) 09/03/2017 0534   BUN 11 09/03/2017 0534   CREATININE 0.80 09/03/2017 0534   CALCIUM 9.5 09/03/2017 0534   GFRNONAA >60 09/03/2017 0534   GFRAA >60 09/03/2017 0534      Discharge Instructions    Call MD for:  redness, tenderness, or signs of infection (pain, swelling, bleeding, redness, odor or green/yellow discharge around incision site)   Complete by:  As directed    Call MD for:  severe or increased pain, loss or decreased feeling  in  affected limb(s)   Complete by:  As directed    Call MD for:  temperature >100.5   Complete by:  As directed    Discharge wound care:   Complete by:  As directed    Continue Santyl to right foot as directed.  Wound vac to left leg wound at 125mmHg suction to be changed by home health RN on M/W/F.   Driving Restrictions   Complete by:  As directed    No driving until returning to see Dr. Randie Heinzain for follow up visit and while taking pain medication.   Lifting restrictions   Complete by:  As directed    No heavy lifting for 4 weeks   Resume previous diet   Complete by:  As directed       Discharge Diagnosis:  infected lt bka stump  Secondary Diagnosis: Patient Active Problem List   Diagnosis Date Noted  . Unilateral AKA, left (HCC)   . Post-operative pain   . OSA (obstructive sleep apnea)   . Benign essential HTN   . Diabetes mellitus type 2 in obese (HCC)   . Hyperkalemia   . Leukocytosis   . Acute blood loss anemia   . Amputation stump infection (HCC) 08/23/2017  . Amputation of left lower extremity above knee upon examination (HCC) 08/23/2017  . PAD (peripheral artery disease) (HCC) 07/23/2017  . Cellulitis 06/10/2017  . Hyponatremia 06/10/2017  . Fever 06/10/2017  . Rash 05/24/2017  . Diabetes (HCC)   . HTN (hypertension)   . Allergic rhinitis   . Insomnia   . Depression    Past Medical History:  Diagnosis Date  . Allergic rhinitis   . Depression   . Diabetes (HCC)   . Dyslipidemia   . HTN (hypertension)   . Insomnia   . OSA (obstructive sleep apnea)    no cpap  . Urinary incontinence      Allergies as of 09/03/2017      Reactions   Doxycycline Rash      Medication List    TAKE these medications   amitriptyline 100 MG tablet Commonly known as:  ELAVIL Take 100 mg at bedtime by mouth.   collagenase ointment Commonly known as:  SANTYL Apply 1 application topically daily.   [employer] NICOTINE TRANSDERMAL SYS 14 mg/24hr patch Generic drug:   nicotine Apply 1 patch daily for smoking cessation   insulin NPH-regular Human (70-30) 100 UNIT/ML injection Commonly known as:  NOVOLIN 70/30 Inject 70 Units into the skin 2 (two) times daily with a meal.   losartan  100 MG tablet Commonly known as:  COZAAR Take 100 mg by mouth daily.   LYRICA 75 MG capsule Generic drug:  pregabalin Take 75 mg 2 (two) times daily by mouth.   Melatonin 5 MG Tabs Take 5 mg by mouth at bedtime.   oxyCODONE-acetaminophen 5-325 MG tablet Commonly known as:  PERCOCET/ROXICET Take 1 tablet by mouth every 6 (six) hours as needed for moderate pain. What changed:    how much to take  when to take this   pravastatin 20 MG tablet Commonly known as:  PRAVACHOL Take 20 mg by mouth daily.   sertraline 100 MG tablet Commonly known as:  ZOLOFT Take 150 mg by mouth daily.   traZODone 50 MG tablet Commonly known as:  DESYREL Take 50 mg by mouth at bedtime.   VITAMIN E PO Take 1 tablet daily by mouth.   Zinc 50 MG Tabs Take 50 mg by mouth daily.            Durable Medical Equipment  (From admission, onward)        Start     Ordered   09/02/17 1334  For home use only DME Tub bench  Once    Comments:  Tub/shower bench;Other (comment)(reacher)   09/02/17 1334       Discharge Care Instructions  (From admission, onward)        Start     Ordered   09/03/17 0000  Discharge wound care:    Comments:  Continue Santyl to right foot as directed.  Wound vac to left leg wound at 125mmHg suction to be changed by home health RN on M/W/F.   09/03/17 [id number]      Prescriptions given: 1.  Roxicet #30 No Refill 2.  Bactrim bid #14 No Refill Instructions: 1.  No driving for until returning to see Dr. Randie Heinzain in the office and while taking pain medication 2.  No heavy lifting x 4 weeks 3.  Wound vac change M/W/F at 125mmHg suction  Disposition: home with HHPT/OT/RN  Patient's condition: is Good  Follow up: 1. Dr. Randie Heinzain in 2 weeks with wound  check.   Doreatha MassedSamantha Rhyne, PA-C [facility] [phone] 09/03/2017  8:35 AM

## 2017-09-03 NOTE — Progress Notes (Signed)
Occupational Therapy Treatment Patient Details Name: Danielle Buchanan MRN: [id number] DOB: 1956/02/13 Today's Date: 09/03/2017    History of present illness 61 y.o. female presents in follow-up from recent left revision of her left below-knee amputation, had significant drainage and pain, L AKA on 11/20, revision and placement of wound vac 11/23.   OT comments  Pt performed bathing, dressing, grooming at EOB leaning side to side with set up and supervision. Squat pivot transferred to chair with min guard assist. Pt is happy to go home today to see her dog.  Follow Up Recommendations  Home health OT;Supervision/Assistance - 24 hour    Equipment Recommendations  None recommended by OT    Recommendations for Other Services      Precautions / Restrictions Precautions Precautions: Fall Precaution Comments: L AKA, wound vac       Mobility Bed Mobility Overal bed mobility: Needs Assistance Bed Mobility: Supine to Sit     Supine to sit: Supervision     General bed mobility comments: increased time and effort and use of rail  Transfers Overall transfer level: Needs assistance Equipment used: None Transfers: Squat Pivot Transfers   Stand pivot transfers: Min guard       General transfer comment: bed to chair towards L    Balance Overall balance assessment: Needs assistance   Sitting balance-Leahy Scale: Fair Sitting balance - Comments: LOB with pericare in sitting, but self corrected                                   ADL either performed or assessed with clinical judgement   ADL Overall ADL's : Needs assistance/impaired     Grooming: Sitting;Set up;Supervision/safety;Brushing hair;Wash/dry hands;Wash/dry face   Upper Body Bathing: Minimal assistance;Sitting Upper Body Bathing Details (indicate cue type and reason): assisted for back Lower Body Bathing: Supervison/ safety;Set up;Sitting/lateral leans   Upper Body Dressing : Set  up;Sitting;Supervision/safety   Lower Body Dressing: Maximal assistance;Sitting/lateral leans Lower Body Dressing Details (indicate cue type and reason): R sock Toilet Transfer: Min Forensic psychologistguard;Squat-pivot Toilet Transfer Details (indicate cue type and reason): simulated to recliner           General ADL Comments: Pt reports follow through with UB HEP.     Vision       Perception     Praxis      Cognition Arousal/Alertness: Awake/alert Behavior During Therapy: WFL for tasks assessed/performed Overall Cognitive Status: Impaired/Different from baseline Area of Impairment: Memory                     Memory: Decreased short-term memory                  Exercises     Shoulder Instructions       General Comments      Pertinent Vitals/ Pain       Pain Assessment: No/denies pain  Home Living                                          Prior Functioning/Environment              Frequency  Min 3X/week        Progress Toward Goals  OT Goals(current goals can now be found in the care plan section)  Progress towards OT goals:  Progressing toward goals  Acute Rehab OT Goals Patient Stated Goal: to go home Time For Goal Achievement: 09/07/17 Potential to Achieve Goals: Good  Plan Discharge plan needs to be updated    Co-evaluation                 AM-PAC PT "6 Clicks" Daily Activity     Outcome Measure   Help from another person eating meals?: None Help from another person taking care of personal grooming?: A Little Help from another person toileting, which includes using toliet, bedpan, or urinal?: A Little Help from another person bathing (including washing, rinsing, drying)?: A Little Help from another person to put on and taking off regular upper body clothing?: None Help from another person to put on and taking off regular lower body clothing?: A Lot 6 Click Score: 19    End of Session Equipment Utilized During  Treatment: Gait belt  OT Visit Diagnosis: Muscle weakness (generalized) (M62.81);Other symptoms and signs involving cognitive function;Unsteadiness on feet (R26.81)   Activity Tolerance Patient tolerated treatment well   Patient Left in chair;with call bell/phone within reach   Nurse Communication Other (comment)(bath completed)        Time: [id number] OT Time Calculation (min): 37 min  Charges: OT General Charges $OT Visit: 1 Visit OT Treatments $Self Care/Home Management : 23-37 mins  09/03/2017 Danielle Buchanan, Danielle Buchanan Pager: [phone]   Danielle Buchanan, Danielle Buchanan 09/03/2017, 9:48 AM

## 2017-09-03 NOTE — Progress Notes (Signed)
Discharge instructions reviewed with pt and prescriptions given.  Pt verbalized understanding and had no questions.  Pt discharged in stable condition via wheelchair with family.  Jamilet Ambroise Lindsay   

## 2017-09-04 ENCOUNTER — Encounter: Payer: Self-pay | Admitting: *Deleted

## 2017-09-04 ENCOUNTER — Other Ambulatory Visit: Payer: Self-pay | Admitting: *Deleted

## 2017-09-04 DIAGNOSIS — Z4781 Encounter for orthopedic aftercare following surgical amputation: Secondary | ICD-10-CM | POA: Diagnosis not present

## 2017-09-04 DIAGNOSIS — I10 Essential (primary) hypertension: Secondary | ICD-10-CM | POA: Diagnosis not present

## 2017-09-04 DIAGNOSIS — E119 Type 2 diabetes mellitus without complications: Secondary | ICD-10-CM | POA: Diagnosis not present

## 2017-09-04 DIAGNOSIS — Z794 Long term (current) use of insulin: Secondary | ICD-10-CM | POA: Diagnosis not present

## 2017-09-04 DIAGNOSIS — Z89512 Acquired absence of left leg below knee: Secondary | ICD-10-CM | POA: Diagnosis not present

## 2017-09-04 NOTE — Patient Outreach (Addendum)
[facility]) Care Management  09/04/2017  Josefine ClassJanice Lainez 10-15-1955 [id number]   Transition of care RN spoke with pt today concerning [facility] and confirmed identifiers. RN discussed the purpose of today's call and introduced the [facility] program and services along with the purpose of today's call. RN inquired on her recent discharge and offered to review her discharge orders. Pt receptive and her medications were confirmed and review along with the transition of care template completed. Pt states she will contact her provider and schedule a post-op office visit with her primary care provider. RN offered to assist however pt indicated she would call and make the appointment. RN discussed some of the pt's medical issues and management of care. Pt states her DM and HTN are under control at this time. Pt states her hospital admission was for a left amputation and she is receiving services with [facility] for PT and wound care. RN offered to further engage and discussed the difference between [facility] who involved with dressing changes and PT services vs. Care management services in the home to assist with managing some of her ongoing medical need that may or maybe related as a prevention measure however pt opt to decline at this time indicating she is doing well and feels she does not need [facility] home visits at this time. RN further offered to follow up with telephonic calls weekly over the next month for case management services however pt again declined but was very appreciative for the information provided today. RN has informed pt that her primary provider will be notified of her decline of services in self management of her ongoing care. No other inquires or request at this time. Case will be closed.  Patient was recently discharged from hospital and all medications have been reviewed.  Elliot CousinLisa Kobie Matkins, RN Care Management Coordinator [facility] Main Office [phone]

## 2017-09-05 DIAGNOSIS — T8189XA Other complications of procedures, not elsewhere classified, initial encounter: Secondary | ICD-10-CM | POA: Diagnosis not present

## 2017-09-05 DIAGNOSIS — R296 Repeated falls: Secondary | ICD-10-CM | POA: Diagnosis not present

## 2017-09-05 DIAGNOSIS — E1121 Type 2 diabetes mellitus with diabetic nephropathy: Secondary | ICD-10-CM | POA: Diagnosis not present

## 2017-09-06 ENCOUNTER — Encounter: Payer: Medicare HMO | Admitting: Vascular Surgery

## 2017-09-06 DIAGNOSIS — Z09 Encounter for follow-up examination after completed treatment for conditions other than malignant neoplasm: Secondary | ICD-10-CM | POA: Diagnosis not present

## 2017-09-06 DIAGNOSIS — Z794 Long term (current) use of insulin: Secondary | ICD-10-CM | POA: Diagnosis not present

## 2017-09-06 DIAGNOSIS — E1121 Type 2 diabetes mellitus with diabetic nephropathy: Secondary | ICD-10-CM | POA: Diagnosis not present

## 2017-09-06 DIAGNOSIS — Z4781 Encounter for orthopedic aftercare following surgical amputation: Secondary | ICD-10-CM | POA: Diagnosis not present

## 2017-09-06 DIAGNOSIS — E119 Type 2 diabetes mellitus without complications: Secondary | ICD-10-CM | POA: Diagnosis not present

## 2017-09-06 DIAGNOSIS — Z89612 Acquired absence of left leg above knee: Secondary | ICD-10-CM | POA: Diagnosis not present

## 2017-09-06 DIAGNOSIS — Z89512 Acquired absence of left leg below knee: Secondary | ICD-10-CM | POA: Diagnosis not present

## 2017-09-06 DIAGNOSIS — I10 Essential (primary) hypertension: Secondary | ICD-10-CM | POA: Diagnosis not present

## 2017-09-07 DIAGNOSIS — E119 Type 2 diabetes mellitus without complications: Secondary | ICD-10-CM | POA: Diagnosis not present

## 2017-09-07 DIAGNOSIS — Z794 Long term (current) use of insulin: Secondary | ICD-10-CM | POA: Diagnosis not present

## 2017-09-07 DIAGNOSIS — I10 Essential (primary) hypertension: Secondary | ICD-10-CM | POA: Diagnosis not present

## 2017-09-07 DIAGNOSIS — Z4781 Encounter for orthopedic aftercare following surgical amputation: Secondary | ICD-10-CM | POA: Diagnosis not present

## 2017-09-07 DIAGNOSIS — Z89512 Acquired absence of left leg below knee: Secondary | ICD-10-CM | POA: Diagnosis not present

## 2017-09-09 ENCOUNTER — Telehealth: Payer: Self-pay | Admitting: *Deleted

## 2017-09-09 DIAGNOSIS — Z4781 Encounter for orthopedic aftercare following surgical amputation: Secondary | ICD-10-CM | POA: Diagnosis not present

## 2017-09-09 DIAGNOSIS — Z794 Long term (current) use of insulin: Secondary | ICD-10-CM | POA: Diagnosis not present

## 2017-09-09 DIAGNOSIS — I10 Essential (primary) hypertension: Secondary | ICD-10-CM | POA: Diagnosis not present

## 2017-09-09 DIAGNOSIS — Z89512 Acquired absence of left leg below knee: Secondary | ICD-10-CM | POA: Diagnosis not present

## 2017-09-09 DIAGNOSIS — E119 Type 2 diabetes mellitus without complications: Secondary | ICD-10-CM | POA: Diagnosis not present

## 2017-09-09 NOTE — Telephone Encounter (Signed)
Ms Danielle Buchanan called today requesting more pain medications. She was discharged on 09-03-17 with a Rx for Percocet 5/325mg  [facility] [place] [facility] She is s/p revision of left AKA by Dr. Myra Buchanan on 08-30-17.  I ran the patient in the [place] database:  Refill Daily Dose  09/06/2017 Oxycodone-Acetaminophen 10-325 #90 30days  Dr. Shirlean Mylararol Buchanan  09/03/2017 Oxycodone-Acetaminophen 5-325 #30 #8 days Danielle MassedSamantha Rhyne, PA  I have informed the patient and her sister that [facility] will NOT be prescribing any more pain medications at this time. She is to follow up with Dr. Shirlean Mylararol Buchanan who has also Rx'd pain medication to her in the past 4 months according to the Database; she must be on a pain contract with that office

## 2017-09-10 ENCOUNTER — Other Ambulatory Visit: Payer: Self-pay | Admitting: Physician Assistant

## 2017-09-11 ENCOUNTER — Encounter (HOSPITAL_BASED_OUTPATIENT_CLINIC_OR_DEPARTMENT_OTHER): Payer: Medicare HMO | Attending: Surgery

## 2017-09-11 DIAGNOSIS — Z4781 Encounter for orthopedic aftercare following surgical amputation: Secondary | ICD-10-CM | POA: Diagnosis not present

## 2017-09-11 DIAGNOSIS — I10 Essential (primary) hypertension: Secondary | ICD-10-CM | POA: Diagnosis not present

## 2017-09-11 DIAGNOSIS — E119 Type 2 diabetes mellitus without complications: Secondary | ICD-10-CM | POA: Diagnosis not present

## 2017-09-11 DIAGNOSIS — Z794 Long term (current) use of insulin: Secondary | ICD-10-CM | POA: Diagnosis not present

## 2017-09-11 DIAGNOSIS — Z89512 Acquired absence of left leg below knee: Secondary | ICD-10-CM | POA: Diagnosis not present

## 2017-09-12 ENCOUNTER — Telehealth: Payer: Self-pay | Admitting: *Deleted

## 2017-09-12 NOTE — Telephone Encounter (Signed)
Returned call to Bernerd LimboSusan D, OT with [facility]. Patient was evaluated today by Darl PikesSusan and she was requesting a verbal order to see patient twice  weekly for 5 weeks.  Verbal order was given

## 2017-09-13 DIAGNOSIS — I10 Essential (primary) hypertension: Secondary | ICD-10-CM | POA: Diagnosis not present

## 2017-09-13 DIAGNOSIS — Z794 Long term (current) use of insulin: Secondary | ICD-10-CM | POA: Diagnosis not present

## 2017-09-13 DIAGNOSIS — Z4781 Encounter for orthopedic aftercare following surgical amputation: Secondary | ICD-10-CM | POA: Diagnosis not present

## 2017-09-13 DIAGNOSIS — Z89512 Acquired absence of left leg below knee: Secondary | ICD-10-CM | POA: Diagnosis not present

## 2017-09-13 DIAGNOSIS — E119 Type 2 diabetes mellitus without complications: Secondary | ICD-10-CM | POA: Diagnosis not present

## 2017-09-17 DIAGNOSIS — Z794 Long term (current) use of insulin: Secondary | ICD-10-CM | POA: Diagnosis not present

## 2017-09-17 DIAGNOSIS — Z89512 Acquired absence of left leg below knee: Secondary | ICD-10-CM | POA: Diagnosis not present

## 2017-09-17 DIAGNOSIS — Z4781 Encounter for orthopedic aftercare following surgical amputation: Secondary | ICD-10-CM | POA: Diagnosis not present

## 2017-09-17 DIAGNOSIS — E119 Type 2 diabetes mellitus without complications: Secondary | ICD-10-CM | POA: Diagnosis not present

## 2017-09-17 DIAGNOSIS — I10 Essential (primary) hypertension: Secondary | ICD-10-CM | POA: Diagnosis not present

## 2017-09-18 ENCOUNTER — Other Ambulatory Visit: Payer: Self-pay | Admitting: Vascular Surgery

## 2017-09-18 DIAGNOSIS — E119 Type 2 diabetes mellitus without complications: Secondary | ICD-10-CM | POA: Diagnosis not present

## 2017-09-18 DIAGNOSIS — Z89512 Acquired absence of left leg below knee: Secondary | ICD-10-CM | POA: Diagnosis not present

## 2017-09-18 DIAGNOSIS — Z4781 Encounter for orthopedic aftercare following surgical amputation: Secondary | ICD-10-CM | POA: Diagnosis not present

## 2017-09-18 DIAGNOSIS — Z794 Long term (current) use of insulin: Secondary | ICD-10-CM | POA: Diagnosis not present

## 2017-09-18 DIAGNOSIS — I10 Essential (primary) hypertension: Secondary | ICD-10-CM | POA: Diagnosis not present

## 2017-09-19 DIAGNOSIS — Z89512 Acquired absence of left leg below knee: Secondary | ICD-10-CM | POA: Diagnosis not present

## 2017-09-19 DIAGNOSIS — Z4781 Encounter for orthopedic aftercare following surgical amputation: Secondary | ICD-10-CM | POA: Diagnosis not present

## 2017-09-19 DIAGNOSIS — E119 Type 2 diabetes mellitus without complications: Secondary | ICD-10-CM | POA: Diagnosis not present

## 2017-09-19 DIAGNOSIS — I10 Essential (primary) hypertension: Secondary | ICD-10-CM | POA: Diagnosis not present

## 2017-09-19 DIAGNOSIS — Z794 Long term (current) use of insulin: Secondary | ICD-10-CM | POA: Diagnosis not present

## 2017-09-20 ENCOUNTER — Ambulatory Visit (INDEPENDENT_AMBULATORY_CARE_PROVIDER_SITE_OTHER): Payer: Medicare HMO | Admitting: Vascular Surgery

## 2017-09-20 ENCOUNTER — Encounter: Payer: Self-pay | Admitting: Vascular Surgery

## 2017-09-20 ENCOUNTER — Encounter: Payer: Medicare HMO | Admitting: Vascular Surgery

## 2017-09-20 VITALS — BP 115/68 | HR 73 | Temp 98.2°F | Resp 18 | Ht 65.0 in | Wt 208.0 lb

## 2017-09-20 DIAGNOSIS — I739 Peripheral vascular disease, unspecified: Secondary | ICD-10-CM

## 2017-09-20 NOTE — Progress Notes (Signed)
Patient ID: Danielle ClassJanice Althaus, female   DOB: 03/27/1956, 61 y.o.   MRN: [id number]  Reason for Consult: Routine Post Op (2 wk f/u s/p L AKA)   Referred by Shirlean MylarWebb, Carol, MD  Subjective:     HPI:  Danielle Buchanan is a 61 y.o. female with recent history of left above-knee amputation that was infected and had to be taken back washout wound VAC placed.  She is now healing very well with the help of home health wound care.  She also has an ulceration on her right fourth toe.  She has previous angiogram which demonstrated flow via her peroneal and posterior tibial was disease to the level of the ankle.  She is feeling much better.  She is having minimal drainage in the wound VAC on the left.  Pain is now 3 out of 10.  Past Medical History:  Diagnosis Date  . Allergic rhinitis   . Depression   . Diabetes (HCC)   . Dyslipidemia   . HTN (hypertension)   . Insomnia   . OSA (obstructive sleep apnea)    no cpap  . Urinary incontinence    Family History  Problem Relation Age of Onset  . Heart attack Mother   . Alcohol abuse Mother   . Heart attack Father   . Alcohol abuse Father   . Heart attack Sister   . Heart attack Brother   . Hypercholesterolemia Sister   . Diabetes Neg Hx    Past Surgical History:  Procedure Laterality Date  . ABDOMINAL AORTOGRAM W/LOWER EXTREMITY N/A 06/24/2017   Procedure: ABDOMINAL AORTOGRAM W/LOWER EXTREMITY;  Surgeon: Maeola Harmanain, Tearah Saulsbury Christopher, MD;  [facility];  Service: Cardiovascular;  Laterality: N/A;  . AMPUTATION Left 07/23/2017   Procedure: AMPUTATION  BELOW KNEE;  Surgeon: Maeola Harmanain, Suvan Stcyr Christopher, MD;  [facility];  Service: Vascular;  Laterality: Left;  . AMPUTATION Left 08/12/2017   Procedure: REVISION BELOW KNEE;  Surgeon: Maeola Harmanain, Montrelle Eddings Christopher, MD;  [facility];  Service: Vascular;  Laterality: Left;  . AMPUTATION Left 08/27/2017   Procedure: left ABOVE KNEE AMPUTATION;  Surgeon: Maeola Harmanain, Dashiell Franchino Christopher, MD;  [facility];  Service: Vascular;  Laterality: Left;  . AMPUTATION Left 08/30/2017   Procedure: REVISION AMPUTATION ABOVE KNEE;  Surgeon: Nada LibmanBrabham, Vance W, MD;  Location: [facility] OR;  Service: Vascular;  Laterality: Left;  . APPLICATION OF WOUND VAC Left 08/27/2017   Procedure: APPLICATION OF WOUND VAC;  Surgeon: Maeola Harmanain, [name] Gullickson Christopher, MD;  [facility];  Service: Vascular;  Laterality: Left;  . CARPAL TUNNEL RELEASE Right   . COLONOSCOPY      Short Social History:  Social History   Tobacco Use  . Smoking status: Former Smoker    Packs/day: 1.00    Years: 51.00    Pack years: 51.00    Types: Cigarettes    Last attempt to quit: 06/08/2017    Years since quitting: 0.2  . Smokeless tobacco: Never Used  Substance Use Topics  . Alcohol use: No    Alcohol/week: 0.0 oz    Allergies  Allergen Reactions  . Doxycycline Rash    Current Outpatient Medications  Medication Sig Dispense Refill  . amitriptyline (ELAVIL) 100 MG tablet Take 100 mg at bedtime by mouth.    . collagenase (SANTYL) ointment Apply 1 application topically daily.    . [employer]S NICOTINE TRANSDERMAL SYS 14 MG/24HR patch Apply 1 patch daily for smoking cessation  11  . insulin NPH-regular Human (NOVOLIN  70/30) (70-30) 100 UNIT/ML injection Inject 70 Units into the skin 2 (two) times daily with a meal.    . losartan (COZAAR) 100 MG tablet Take 100 mg by mouth daily.    Marland Kitchen. LYRICA 75 MG capsule Take 75 mg 2 (two) times daily by mouth.    . Melatonin 5 MG TABS Take 5 mg by mouth at bedtime.    Marland Kitchen. oxyCODONE-acetaminophen (PERCOCET/ROXICET) 5-325 MG tablet Take 1 tablet by mouth every 6 (six) hours as needed for moderate pain. 30 tablet 0  . pravastatin (PRAVACHOL) 20 MG tablet Take 20 mg by mouth daily.     . sertraline (ZOLOFT) 100 MG tablet Take 150 mg by mouth daily.     Marland Kitchen. sulfamethoxazole-trimethoprim (BACTRIM DS,SEPTRA DS) 800-160 MG tablet TAKE 1 TABLET BY MOUTH EVERY 12 HOURS 14 tablet 0  . traZODone (DESYREL) 50 MG tablet Take 50  mg by mouth at bedtime.    Marland Kitchen. VITAMIN E PO Take 1 tablet daily by mouth.     . Zinc 50 MG TABS Take 50 mg by mouth daily.     No current facility-administered medications for this visit.     Review of Systems  Cardiovascular: Positive for leg swelling.  Skin: Positive for wound.        Objective:  Objective   Vitals:   09/20/17 1118  BP: 115/68  Pulse: 73  Resp: 18  Temp: 98.2 F (36.8 C)  TempSrc: Oral  SpO2: 98%  Weight: 208 lb (94.3 kg)  Height: 5\' 5"  (1.651 m)   Body mass index is 34.61 kg/m.  Physical Exam  Constitutional: She appears well-developed.  HENT:  Head: Normocephalic.  Eyes: Pupils are equal, round, and reactive to light.  Neck: Normal range of motion.  Abdominal: Soft.  Skin:  Granulation tissue left aka site, hourglass configuration wound with central area nearly healed    Data: No studies     Assessment/Plan:      61 year old female status post left above-knee amputation that is now healing with wound VAC.  At this time she can switch to wet-to-dry dressings and the wound VAC would be difficult.  She also has a wound on her right fourth and fifth toe is being evaluated by home health as well and this has a dressing in place today.  We will proceed with angiogram will need to be aggressive treating possibly her posterior tibial artery in case she needs trans-metatarsal irritation.  She demonstrates good understanding we will get this scheduled for after the holidays.     Maeola HarmanBrandon Christopher Aniyha Tate MD [facility]

## 2017-09-21 DIAGNOSIS — Z794 Long term (current) use of insulin: Secondary | ICD-10-CM | POA: Diagnosis not present

## 2017-09-21 DIAGNOSIS — E119 Type 2 diabetes mellitus without complications: Secondary | ICD-10-CM | POA: Diagnosis not present

## 2017-09-21 DIAGNOSIS — Z4781 Encounter for orthopedic aftercare following surgical amputation: Secondary | ICD-10-CM | POA: Diagnosis not present

## 2017-09-21 DIAGNOSIS — I10 Essential (primary) hypertension: Secondary | ICD-10-CM | POA: Diagnosis not present

## 2017-09-21 DIAGNOSIS — Z89512 Acquired absence of left leg below knee: Secondary | ICD-10-CM | POA: Diagnosis not present

## 2017-09-23 ENCOUNTER — Other Ambulatory Visit: Payer: Self-pay

## 2017-09-23 DIAGNOSIS — Z89512 Acquired absence of left leg below knee: Secondary | ICD-10-CM | POA: Diagnosis not present

## 2017-09-23 DIAGNOSIS — E119 Type 2 diabetes mellitus without complications: Secondary | ICD-10-CM | POA: Diagnosis not present

## 2017-09-23 DIAGNOSIS — Z794 Long term (current) use of insulin: Secondary | ICD-10-CM | POA: Diagnosis not present

## 2017-09-23 DIAGNOSIS — Z4781 Encounter for orthopedic aftercare following surgical amputation: Secondary | ICD-10-CM | POA: Diagnosis not present

## 2017-09-23 DIAGNOSIS — I10 Essential (primary) hypertension: Secondary | ICD-10-CM | POA: Diagnosis not present

## 2017-09-25 DIAGNOSIS — Z89512 Acquired absence of left leg below knee: Secondary | ICD-10-CM | POA: Diagnosis not present

## 2017-09-25 DIAGNOSIS — Z4781 Encounter for orthopedic aftercare following surgical amputation: Secondary | ICD-10-CM | POA: Diagnosis not present

## 2017-09-25 DIAGNOSIS — Z794 Long term (current) use of insulin: Secondary | ICD-10-CM | POA: Diagnosis not present

## 2017-09-25 DIAGNOSIS — E119 Type 2 diabetes mellitus without complications: Secondary | ICD-10-CM | POA: Diagnosis not present

## 2017-09-25 DIAGNOSIS — I10 Essential (primary) hypertension: Secondary | ICD-10-CM | POA: Diagnosis not present

## 2017-09-26 DIAGNOSIS — Z794 Long term (current) use of insulin: Secondary | ICD-10-CM | POA: Diagnosis not present

## 2017-09-26 DIAGNOSIS — Z4781 Encounter for orthopedic aftercare following surgical amputation: Secondary | ICD-10-CM | POA: Diagnosis not present

## 2017-09-26 DIAGNOSIS — Z89512 Acquired absence of left leg below knee: Secondary | ICD-10-CM | POA: Diagnosis not present

## 2017-09-26 DIAGNOSIS — I10 Essential (primary) hypertension: Secondary | ICD-10-CM | POA: Diagnosis not present

## 2017-09-26 DIAGNOSIS — E119 Type 2 diabetes mellitus without complications: Secondary | ICD-10-CM | POA: Diagnosis not present

## 2017-09-27 DIAGNOSIS — I10 Essential (primary) hypertension: Secondary | ICD-10-CM | POA: Diagnosis not present

## 2017-09-27 DIAGNOSIS — Z89512 Acquired absence of left leg below knee: Secondary | ICD-10-CM | POA: Diagnosis not present

## 2017-09-27 DIAGNOSIS — Z4781 Encounter for orthopedic aftercare following surgical amputation: Secondary | ICD-10-CM | POA: Diagnosis not present

## 2017-09-27 DIAGNOSIS — Z794 Long term (current) use of insulin: Secondary | ICD-10-CM | POA: Diagnosis not present

## 2017-09-27 DIAGNOSIS — E119 Type 2 diabetes mellitus without complications: Secondary | ICD-10-CM | POA: Diagnosis not present

## 2017-10-02 DIAGNOSIS — E119 Type 2 diabetes mellitus without complications: Secondary | ICD-10-CM | POA: Diagnosis not present

## 2017-10-02 DIAGNOSIS — I10 Essential (primary) hypertension: Secondary | ICD-10-CM | POA: Diagnosis not present

## 2017-10-02 DIAGNOSIS — Z794 Long term (current) use of insulin: Secondary | ICD-10-CM | POA: Diagnosis not present

## 2017-10-02 DIAGNOSIS — Z4781 Encounter for orthopedic aftercare following surgical amputation: Secondary | ICD-10-CM | POA: Diagnosis not present

## 2017-10-02 DIAGNOSIS — Z89512 Acquired absence of left leg below knee: Secondary | ICD-10-CM | POA: Diagnosis not present

## 2017-10-03 DIAGNOSIS — Z794 Long term (current) use of insulin: Secondary | ICD-10-CM | POA: Diagnosis not present

## 2017-10-03 DIAGNOSIS — R296 Repeated falls: Secondary | ICD-10-CM | POA: Diagnosis not present

## 2017-10-03 DIAGNOSIS — Z4781 Encounter for orthopedic aftercare following surgical amputation: Secondary | ICD-10-CM | POA: Diagnosis not present

## 2017-10-03 DIAGNOSIS — I10 Essential (primary) hypertension: Secondary | ICD-10-CM | POA: Diagnosis not present

## 2017-10-03 DIAGNOSIS — Z89512 Acquired absence of left leg below knee: Secondary | ICD-10-CM | POA: Diagnosis not present

## 2017-10-03 DIAGNOSIS — T8189XA Other complications of procedures, not elsewhere classified, initial encounter: Secondary | ICD-10-CM | POA: Diagnosis not present

## 2017-10-03 DIAGNOSIS — E1121 Type 2 diabetes mellitus with diabetic nephropathy: Secondary | ICD-10-CM | POA: Diagnosis not present

## 2017-10-03 DIAGNOSIS — E119 Type 2 diabetes mellitus without complications: Secondary | ICD-10-CM | POA: Diagnosis not present

## 2017-10-04 ENCOUNTER — Other Ambulatory Visit: Payer: Self-pay | Admitting: *Deleted

## 2017-10-04 ENCOUNTER — Telehealth: Payer: Self-pay

## 2017-10-04 DIAGNOSIS — Z4781 Encounter for orthopedic aftercare following surgical amputation: Secondary | ICD-10-CM | POA: Diagnosis not present

## 2017-10-04 DIAGNOSIS — E119 Type 2 diabetes mellitus without complications: Secondary | ICD-10-CM | POA: Diagnosis not present

## 2017-10-04 DIAGNOSIS — Z89512 Acquired absence of left leg below knee: Secondary | ICD-10-CM | POA: Diagnosis not present

## 2017-10-04 DIAGNOSIS — Z794 Long term (current) use of insulin: Secondary | ICD-10-CM | POA: Diagnosis not present

## 2017-10-04 DIAGNOSIS — I10 Essential (primary) hypertension: Secondary | ICD-10-CM | POA: Diagnosis not present

## 2017-10-04 NOTE — Telephone Encounter (Signed)
[facility] Physical Therapist, Threasa AlphaJim Hoffman, called requesting orders to continue PT. Patient is scheduled for procedure on 10/09/17. PT was continued for 2 weeks.

## 2017-10-07 DIAGNOSIS — Z89512 Acquired absence of left leg below knee: Secondary | ICD-10-CM | POA: Diagnosis not present

## 2017-10-07 DIAGNOSIS — Z89619 Acquired absence of unspecified leg above knee: Secondary | ICD-10-CM | POA: Diagnosis not present

## 2017-10-07 DIAGNOSIS — R296 Repeated falls: Secondary | ICD-10-CM | POA: Diagnosis not present

## 2017-10-07 DIAGNOSIS — I739 Peripheral vascular disease, unspecified: Secondary | ICD-10-CM | POA: Diagnosis not present

## 2017-10-07 DIAGNOSIS — Z794 Long term (current) use of insulin: Secondary | ICD-10-CM | POA: Diagnosis not present

## 2017-10-07 DIAGNOSIS — E119 Type 2 diabetes mellitus without complications: Secondary | ICD-10-CM | POA: Diagnosis not present

## 2017-10-07 DIAGNOSIS — Z4781 Encounter for orthopedic aftercare following surgical amputation: Secondary | ICD-10-CM | POA: Diagnosis not present

## 2017-10-07 DIAGNOSIS — I10 Essential (primary) hypertension: Secondary | ICD-10-CM | POA: Diagnosis not present

## 2017-10-09 ENCOUNTER — Encounter (HOSPITAL_COMMUNITY)
Admission: RE | Disposition: A | Discharge status: Home / Self Care | Payer: Self-pay | Source: Ambulatory Visit | Attending: Vascular Surgery

## 2017-10-09 ENCOUNTER — Ambulatory Visit (HOSPITAL_COMMUNITY)
Admission: RE | Admit: 2017-10-09 | Discharge: 2017-10-09 | Disposition: A | Discharge status: Home / Self Care | Payer: Medicare HMO | Source: Ambulatory Visit | Attending: Vascular Surgery | Admitting: Vascular Surgery

## 2017-10-09 DIAGNOSIS — I10 Essential (primary) hypertension: Secondary | ICD-10-CM | POA: Insufficient documentation

## 2017-10-09 DIAGNOSIS — Z87891 Personal history of nicotine dependence: Secondary | ICD-10-CM | POA: Diagnosis not present

## 2017-10-09 DIAGNOSIS — Z8249 Family history of ischemic heart disease and other diseases of the circulatory system: Secondary | ICD-10-CM | POA: Diagnosis not present

## 2017-10-09 DIAGNOSIS — G4700 Insomnia, unspecified: Secondary | ICD-10-CM | POA: Insufficient documentation

## 2017-10-09 DIAGNOSIS — Z89612 Acquired absence of left leg above knee: Secondary | ICD-10-CM | POA: Insufficient documentation

## 2017-10-09 DIAGNOSIS — I70261 Atherosclerosis of native arteries of extremities with gangrene, right leg: Secondary | ICD-10-CM | POA: Insufficient documentation

## 2017-10-09 DIAGNOSIS — Z79899 Other long term (current) drug therapy: Secondary | ICD-10-CM | POA: Diagnosis not present

## 2017-10-09 DIAGNOSIS — G4733 Obstructive sleep apnea (adult) (pediatric): Secondary | ICD-10-CM | POA: Insufficient documentation

## 2017-10-09 DIAGNOSIS — I70235 Atherosclerosis of native arteries of right leg with ulceration of other part of foot: Secondary | ICD-10-CM | POA: Diagnosis not present

## 2017-10-09 DIAGNOSIS — E785 Hyperlipidemia, unspecified: Secondary | ICD-10-CM | POA: Diagnosis not present

## 2017-10-09 DIAGNOSIS — E1152 Type 2 diabetes mellitus with diabetic peripheral angiopathy with gangrene: Secondary | ICD-10-CM | POA: Diagnosis not present

## 2017-10-09 DIAGNOSIS — Z794 Long term (current) use of insulin: Secondary | ICD-10-CM | POA: Diagnosis not present

## 2017-10-09 HISTORY — PX: LOWER EXTREMITY INTERVENTION: CATH118252

## 2017-10-09 HISTORY — PX: ABDOMINAL AORTOGRAM W/LOWER EXTREMITY: CATH118223

## 2017-10-09 LAB — POCT ACTIVATED CLOTTING TIME
ACTIVATED CLOTTING TIME: 219 s
Activated Clotting Time: 191 seconds

## 2017-10-09 LAB — GLUCOSE, CAPILLARY: GLUCOSE-CAPILLARY: 88 mg/dL (ref 65–99)

## 2017-10-09 SURGERY — LOWER EXTREMITY INTERVENTION
Anesthesia: LOCAL | Laterality: Right

## 2017-10-09 MED ORDER — NITROGLYCERIN 1 MG/10 ML FOR IR/CATH LAB
INTRA_ARTERIAL | Status: AC
  Filled 2017-10-09: qty 10

## 2017-10-09 MED ORDER — CLOPIDOGREL BISULFATE 75 MG PO TABS
300.0000 mg | ORAL_TABLET | Freq: Once | ORAL | Status: AC
  Administered 2017-10-09: 300 mg via ORAL

## 2017-10-09 MED ORDER — MIDAZOLAM HCL 2 MG/2ML IJ SOLN
INTRAMUSCULAR | Status: DC | PRN
  Administered 2017-10-09: 1 mg via INTRAVENOUS

## 2017-10-09 MED ORDER — HEPARIN (PORCINE) IN NACL 2-0.9 UNIT/ML-% IJ SOLN
INTRAMUSCULAR | Status: AC
  Filled 2017-10-09: qty 1000

## 2017-10-09 MED ORDER — MIDAZOLAM HCL 2 MG/2ML IJ SOLN
INTRAMUSCULAR | Status: AC
  Filled 2017-10-09: qty 2

## 2017-10-09 MED ORDER — HEPARIN SODIUM (PORCINE) 1000 UNIT/ML IJ SOLN
INTRAMUSCULAR | Status: AC
  Filled 2017-10-09: qty 1

## 2017-10-09 MED ORDER — SODIUM CHLORIDE 0.9 % WEIGHT BASED INFUSION: 1.0000 mL/kg/h | INTRAVENOUS | Status: DC

## 2017-10-09 MED ORDER — LIDOCAINE HCL (PF) 1 % IJ SOLN
INTRAMUSCULAR | Status: DC | PRN
  Administered 2017-10-09: 20 mL via INTRADERMAL

## 2017-10-09 MED ORDER — HEPARIN (PORCINE) IN NACL 2-0.9 UNIT/ML-% IJ SOLN
INTRAMUSCULAR | Status: AC | PRN
  Administered 2017-10-09: 1000 mL via INTRA_ARTERIAL

## 2017-10-09 MED ORDER — LABETALOL HCL 5 MG/ML IV SOLN: 10.0000 mg | INTRAVENOUS | Status: DC | PRN

## 2017-10-09 MED ORDER — FENTANYL CITRATE (PF) 100 MCG/2ML IJ SOLN
INTRAMUSCULAR | Status: AC
  Filled 2017-10-09: qty 2

## 2017-10-09 MED ORDER — SODIUM CHLORIDE 0.9% FLUSH: 3.0000 mL | INTRAVENOUS | Status: DC | PRN

## 2017-10-09 MED ORDER — CLOPIDOGREL BISULFATE 300 MG PO TABS
ORAL_TABLET | ORAL | Status: AC
  Filled 2017-10-09: qty 1

## 2017-10-09 MED ORDER — IODIXANOL 320 MG/ML IV SOLN
INTRAVENOUS | Status: DC | PRN
  Administered 2017-10-09: 130 mL via INTRAVENOUS

## 2017-10-09 MED ORDER — HEPARIN SODIUM (PORCINE) 1000 UNIT/ML IJ SOLN
INTRAMUSCULAR | Status: DC | PRN
  Administered 2017-10-09: 9000 [IU] via INTRAVENOUS
  Administered 2017-10-09: 3000 [IU] via INTRAVENOUS

## 2017-10-09 MED ORDER — SODIUM CHLORIDE 0.9 % IV SOLN: 250.0000 mL | INTRAVENOUS | Status: DC | PRN

## 2017-10-09 MED ORDER — SODIUM CHLORIDE 0.9% FLUSH: 3.0000 mL | Freq: Two times a day (BID) | INTRAVENOUS | Status: DC

## 2017-10-09 MED ORDER — OXYCODONE HCL 5 MG PO TABS: 5.0000 mg | ORAL_TABLET | ORAL | Status: DC | PRN

## 2017-10-09 MED ORDER — CLOPIDOGREL BISULFATE 75 MG PO TABS: 75.0000 mg | ORAL_TABLET | Freq: Every day | ORAL | 3 refills | Status: AC

## 2017-10-09 MED ORDER — CLOPIDOGREL BISULFATE 75 MG PO TABS: 75.0000 mg | ORAL_TABLET | Freq: Every day | ORAL | Status: DC

## 2017-10-09 MED ORDER — LIDOCAINE HCL (PF) 1 % IJ SOLN
INTRAMUSCULAR | Status: AC
  Filled 2017-10-09: qty 30

## 2017-10-09 MED ORDER — SODIUM CHLORIDE 0.9 % IV SOLN
INTRAVENOUS | Status: DC
  Administered 2017-10-09: 12:00:00 via INTRAVENOUS

## 2017-10-09 MED ORDER — NITROGLYCERIN 1 MG/10 ML FOR IR/CATH LAB
INTRA_ARTERIAL | Status: DC | PRN
  Administered 2017-10-09: 200 ug via INTRA_ARTERIAL

## 2017-10-09 MED ORDER — FENTANYL CITRATE (PF) 100 MCG/2ML IJ SOLN
INTRAMUSCULAR | Status: DC | PRN
  Administered 2017-10-09: 100 ug via INTRAVENOUS

## 2017-10-09 MED ORDER — HYDRALAZINE HCL 20 MG/ML IJ SOLN: 5.0000 mg | INTRAMUSCULAR | Status: DC | PRN

## 2017-10-09 SURGICAL SUPPLY — 19 items
BALLN STERLI SL OTW 2.5X80X150 (BALLOONS) ×2
BALLN STERLING OTW 3X220X150 (BALLOONS) ×2
BALLOON STERLING OTW 3X220X150 (BALLOONS) ×1 IMPLANT
BALLOON STRLNG OTW 2.5X80X150 (BALLOONS) ×1 IMPLANT
CATH CXI SUPP ANG 2.6FR 150CM (MICROCATHETER) ×2 IMPLANT
CATH OMNI FLUSH 5F 65CM (CATHETERS) ×2 IMPLANT
DEVICE CLOSURE MYNXGRIP 6/7F (Vascular Products) ×2 IMPLANT
KIT ENCORE 26 ADVANTAGE (KITS) ×2 IMPLANT
KIT MICROINTRODUCER STIFF 5F (SHEATH) ×2 IMPLANT
KIT PV (KITS) ×2 IMPLANT
SHEATH HIGHFLEX ANSEL 6FRX55 (SHEATH) ×2 IMPLANT
SHEATH PINNACLE 5F 10CM (SHEATH) ×2 IMPLANT
SHEATH PINNACLE 6F 10CM (SHEATH) ×2 IMPLANT
SHIELD RADPAD SCOOP 12X17 (MISCELLANEOUS) ×2 IMPLANT
SYR MEDRAD MARK V 150ML (SYRINGE) ×2 IMPLANT
TRANSDUCER W/STOPCOCK (MISCELLANEOUS) ×2 IMPLANT
TRAY PV CATH (CUSTOM PROCEDURE TRAY) ×2 IMPLANT
WIRE BENTSON .035X145CM (WIRE) ×2 IMPLANT
WIRE G V18X300CM (WIRE) ×2 IMPLANT

## 2017-10-09 NOTE — H&P (Signed)
   History and Physical Update  The patient was interviewed and re-examined.  The patient's previous History and Physical has been reviewed and is unchanged from recent office visit. Plan for aortogram with rle runoff and possible intervention.  Reco Shonk C. Randie Heinzain, MD [facility] [place] Office: [phone] Pager: [phone]   10/09/2017, 1:41 PM

## 2017-10-09 NOTE — Progress Notes (Signed)
Pt arrived to short stay, no IV, Dr Randie Heinzain aware and told lab staff she can drink enough to not place another one.

## 2017-10-09 NOTE — Discharge Instructions (Signed)

## 2017-10-09 NOTE — Op Note (Signed)
    Patient name: Danielle Buchanan MRN: [id number] DOB: [birth year] Sex: female  10/09/2017 Pre-operative Diagnosis: critical right lower extremity ischemia with dry gangrene of right 4th toe Post-operative diagnosis:  Same Surgeon:  Luanna SalkBrandon C. Randie Heinzain, MD Procedure Performed: 1.  US guided cannulation of left common femoral artery 2.  Aortogram with right lower extremity runoff 3.  Balloon angioplasty of right posterior tibial artery with 2.765mm distal and 3mm balloon proximal 4.  Mynx grip percutaneous closure of left common femoral artery 5.  Moderate sedation with fentanyl and versed for 59 minutes  Indications: 62 year old female has a left above-knee amputation.  She has right fourth toe gangrene but it does appear to be improving.  She has known tibial disease on the right is indicated for aortogram possible intervention on the right.  Findings: The aorta and iliac segments are free of disease.  The right SFA has approximately 30% stenosis proximally nothing flow-limiting.  Runoff to the foot is via the peroneal which apparently gives out near the ankle some distally in the foot does feel but no named vessels.  Posterior tibial artery is present proximally and occluded reconstitutes distally is very small.  Following intervention we have brisk flow through the posterior tibial artery with 0% residual stenosis where it was once occluded.   Procedure:  The patient was identified in the holding area and taken to room 8.  The patient was then placed supine on the table and prepped and draped in the usual sterile fashion.  A time out was called.  Ultrasound was used to evaluate the left common femoral artery.  It was patent .  A digital ultrasound image was acquired.  A micropuncture needle was used to access the left common femoral artery under ultrasound guidance.  An 018 wire was advanced without resistance and a micropuncture sheath was placed.  The 018 wire was removed and a benson wire was placed.   The micropuncture sheath was exchanged for a 5 french sheath.  An omniflush catheter was advanced over the wire to the level of L-1.  An abdominal angiogram was obtained.  Next, using the omniflush catheter and a benson wire, the aortic bifurcation was crossed and the catheter was placed into theright external iliac artery and right runoff was obtained.  With the above findings we elected to intervene.  I then placed a long 6 French sheath into the right common femoral artery and the patient was given heparin totaling 9000 units.  We then used the V 18 and CXI catheter to cross the occluded posterior tibial artery and we demonstrate intraluminal access at the level of the foot.  I then ballooned the posterior tibial artery with a 3 mm balloon and performed angiogram but unfortunately this demonstrated occlusion at the ankle which was then ballooned with a 2.5 mm balloon.  Following this we then had brisk flow without flow-limiting dissection or stenosis in the posterior tibial artery filling unnamed vessels in the foot.  Satisfied with this we exchanged over the wire for a short 6 [place] sheath.  A mynx grip device was then deployed.  Patient tolerated procedure well without immediate complication.   Contrast: 130cc  Brandon C. Randie Heinzain, MD [facility] [place] Office: [phone] Pager: [phone]

## 2017-10-10 ENCOUNTER — Encounter (HOSPITAL_COMMUNITY): Payer: Self-pay | Admitting: Vascular Surgery

## 2017-10-10 ENCOUNTER — Telehealth: Payer: Self-pay | Admitting: Vascular Surgery

## 2017-10-10 DIAGNOSIS — E119 Type 2 diabetes mellitus without complications: Secondary | ICD-10-CM | POA: Diagnosis not present

## 2017-10-10 DIAGNOSIS — Z89512 Acquired absence of left leg below knee: Secondary | ICD-10-CM | POA: Diagnosis not present

## 2017-10-10 DIAGNOSIS — I10 Essential (primary) hypertension: Secondary | ICD-10-CM | POA: Diagnosis not present

## 2017-10-10 DIAGNOSIS — Z794 Long term (current) use of insulin: Secondary | ICD-10-CM | POA: Diagnosis not present

## 2017-10-10 DIAGNOSIS — Z4781 Encounter for orthopedic aftercare following surgical amputation: Secondary | ICD-10-CM | POA: Diagnosis not present

## 2017-10-10 NOTE — Telephone Encounter (Signed)
Sched labs 11/05/17 at 8:00 and MD 11/08/17 at 11:15. Spoke to pt.

## 2017-10-10 NOTE — Telephone Encounter (Signed)
-----   Message from Sharee PimpleMarilyn K McChesney, RN sent at 10/10/2017 10:44 AM EST ----- Regarding: 4 weeks w/ 2 labs   ----- Message ----- From: Maeola Harmanain, Brandon Christopher, MD Sent: 10/09/2017   4:21 PM To: Vvs Charge Pool  Josefine ClassJanice Openshaw [id number] [birth year]  10/09/2017 Pre-operative Diagnosis: critical right lower extremity ischemia with dry gangrene of right 4th toe  Surgeon:  Luanna SalkBrandon C. Randie Heinzain, MD  Procedure Performed: 1.  US guided cannulation of left common femoral artery 2.  Aortogram with right lower extremity runoff 3.  Balloon angioplasty of right posterior tibial artery with 2.615mm distal and 3mm balloon proximal 4.  Mynx grip percutaneous closure of left common femoral artery 5.  Moderate sedation with fentanyl and versed for 59 minutes  F/u in 4 weeks with ABI and right lower extremity duplex.

## 2017-10-11 ENCOUNTER — Telehealth: Payer: Self-pay

## 2017-10-11 ENCOUNTER — Other Ambulatory Visit: Payer: Self-pay

## 2017-10-11 DIAGNOSIS — I10 Essential (primary) hypertension: Secondary | ICD-10-CM | POA: Diagnosis not present

## 2017-10-11 DIAGNOSIS — I70229 Atherosclerosis of native arteries of extremities with rest pain, unspecified extremity: Secondary | ICD-10-CM

## 2017-10-11 DIAGNOSIS — Z89512 Acquired absence of left leg below knee: Secondary | ICD-10-CM | POA: Diagnosis not present

## 2017-10-11 DIAGNOSIS — I998 Other disorder of circulatory system: Secondary | ICD-10-CM

## 2017-10-11 DIAGNOSIS — Z4781 Encounter for orthopedic aftercare following surgical amputation: Secondary | ICD-10-CM | POA: Diagnosis not present

## 2017-10-11 DIAGNOSIS — Z794 Long term (current) use of insulin: Secondary | ICD-10-CM | POA: Diagnosis not present

## 2017-10-11 DIAGNOSIS — E119 Type 2 diabetes mellitus without complications: Secondary | ICD-10-CM | POA: Diagnosis not present

## 2017-10-11 DIAGNOSIS — Z48812 Encounter for surgical aftercare following surgery on the circulatory system: Secondary | ICD-10-CM

## 2017-10-11 DIAGNOSIS — I739 Peripheral vascular disease, unspecified: Secondary | ICD-10-CM

## 2017-10-11 NOTE — Telephone Encounter (Signed)
Returned patient's phone call. She was asking if she should start using the ace wrap to start shaping her stump. She was informed not to yet as it was too early. Wait and discuss with Dr. Randie Heinzain at appointment on 11/05/17. She verbalized understanding.

## 2017-10-14 DIAGNOSIS — Z4781 Encounter for orthopedic aftercare following surgical amputation: Secondary | ICD-10-CM | POA: Diagnosis not present

## 2017-10-14 DIAGNOSIS — Z89512 Acquired absence of left leg below knee: Secondary | ICD-10-CM | POA: Diagnosis not present

## 2017-10-14 DIAGNOSIS — Z794 Long term (current) use of insulin: Secondary | ICD-10-CM | POA: Diagnosis not present

## 2017-10-14 DIAGNOSIS — I10 Essential (primary) hypertension: Secondary | ICD-10-CM | POA: Diagnosis not present

## 2017-10-14 DIAGNOSIS — E119 Type 2 diabetes mellitus without complications: Secondary | ICD-10-CM | POA: Diagnosis not present

## 2017-10-17 DIAGNOSIS — F329 Major depressive disorder, single episode, unspecified: Secondary | ICD-10-CM | POA: Diagnosis not present

## 2017-10-17 DIAGNOSIS — L97511 Non-pressure chronic ulcer of other part of right foot limited to breakdown of skin: Secondary | ICD-10-CM | POA: Diagnosis not present

## 2017-10-17 DIAGNOSIS — Z4781 Encounter for orthopedic aftercare following surgical amputation: Secondary | ICD-10-CM | POA: Diagnosis not present

## 2017-10-17 DIAGNOSIS — E669 Obesity, unspecified: Secondary | ICD-10-CM | POA: Diagnosis not present

## 2017-10-17 DIAGNOSIS — G4733 Obstructive sleep apnea (adult) (pediatric): Secondary | ICD-10-CM | POA: Diagnosis not present

## 2017-10-17 DIAGNOSIS — E1151 Type 2 diabetes mellitus with diabetic peripheral angiopathy without gangrene: Secondary | ICD-10-CM | POA: Diagnosis not present

## 2017-10-17 DIAGNOSIS — Z794 Long term (current) use of insulin: Secondary | ICD-10-CM | POA: Diagnosis not present

## 2017-10-17 DIAGNOSIS — I10 Essential (primary) hypertension: Secondary | ICD-10-CM | POA: Diagnosis not present

## 2017-10-17 DIAGNOSIS — E11621 Type 2 diabetes mellitus with foot ulcer: Secondary | ICD-10-CM | POA: Diagnosis not present

## 2017-10-18 DIAGNOSIS — E1151 Type 2 diabetes mellitus with diabetic peripheral angiopathy without gangrene: Secondary | ICD-10-CM | POA: Diagnosis not present

## 2017-10-18 DIAGNOSIS — L97511 Non-pressure chronic ulcer of other part of right foot limited to breakdown of skin: Secondary | ICD-10-CM | POA: Diagnosis not present

## 2017-10-18 DIAGNOSIS — Z4781 Encounter for orthopedic aftercare following surgical amputation: Secondary | ICD-10-CM | POA: Diagnosis not present

## 2017-10-18 DIAGNOSIS — F329 Major depressive disorder, single episode, unspecified: Secondary | ICD-10-CM | POA: Diagnosis not present

## 2017-10-18 DIAGNOSIS — G4733 Obstructive sleep apnea (adult) (pediatric): Secondary | ICD-10-CM | POA: Diagnosis not present

## 2017-10-18 DIAGNOSIS — E11621 Type 2 diabetes mellitus with foot ulcer: Secondary | ICD-10-CM | POA: Diagnosis not present

## 2017-10-18 DIAGNOSIS — E669 Obesity, unspecified: Secondary | ICD-10-CM | POA: Diagnosis not present

## 2017-10-18 DIAGNOSIS — Z794 Long term (current) use of insulin: Secondary | ICD-10-CM | POA: Diagnosis not present

## 2017-10-18 DIAGNOSIS — I10 Essential (primary) hypertension: Secondary | ICD-10-CM | POA: Diagnosis not present

## 2017-10-23 DIAGNOSIS — E1151 Type 2 diabetes mellitus with diabetic peripheral angiopathy without gangrene: Secondary | ICD-10-CM | POA: Diagnosis not present

## 2017-10-23 DIAGNOSIS — G4733 Obstructive sleep apnea (adult) (pediatric): Secondary | ICD-10-CM | POA: Diagnosis not present

## 2017-10-23 DIAGNOSIS — I10 Essential (primary) hypertension: Secondary | ICD-10-CM | POA: Diagnosis not present

## 2017-10-23 DIAGNOSIS — E11621 Type 2 diabetes mellitus with foot ulcer: Secondary | ICD-10-CM | POA: Diagnosis not present

## 2017-10-23 DIAGNOSIS — E669 Obesity, unspecified: Secondary | ICD-10-CM | POA: Diagnosis not present

## 2017-10-23 DIAGNOSIS — F329 Major depressive disorder, single episode, unspecified: Secondary | ICD-10-CM | POA: Diagnosis not present

## 2017-10-23 DIAGNOSIS — Z4781 Encounter for orthopedic aftercare following surgical amputation: Secondary | ICD-10-CM | POA: Diagnosis not present

## 2017-10-23 DIAGNOSIS — Z794 Long term (current) use of insulin: Secondary | ICD-10-CM | POA: Diagnosis not present

## 2017-10-23 DIAGNOSIS — L97511 Non-pressure chronic ulcer of other part of right foot limited to breakdown of skin: Secondary | ICD-10-CM | POA: Diagnosis not present

## 2017-10-24 DIAGNOSIS — F329 Major depressive disorder, single episode, unspecified: Secondary | ICD-10-CM | POA: Diagnosis not present

## 2017-10-24 DIAGNOSIS — G4733 Obstructive sleep apnea (adult) (pediatric): Secondary | ICD-10-CM | POA: Diagnosis not present

## 2017-10-24 DIAGNOSIS — L97511 Non-pressure chronic ulcer of other part of right foot limited to breakdown of skin: Secondary | ICD-10-CM | POA: Diagnosis not present

## 2017-10-24 DIAGNOSIS — Z794 Long term (current) use of insulin: Secondary | ICD-10-CM | POA: Diagnosis not present

## 2017-10-24 DIAGNOSIS — E1151 Type 2 diabetes mellitus with diabetic peripheral angiopathy without gangrene: Secondary | ICD-10-CM | POA: Diagnosis not present

## 2017-10-24 DIAGNOSIS — Z4781 Encounter for orthopedic aftercare following surgical amputation: Secondary | ICD-10-CM | POA: Diagnosis not present

## 2017-10-24 DIAGNOSIS — E669 Obesity, unspecified: Secondary | ICD-10-CM | POA: Diagnosis not present

## 2017-10-24 DIAGNOSIS — I10 Essential (primary) hypertension: Secondary | ICD-10-CM | POA: Diagnosis not present

## 2017-10-24 DIAGNOSIS — E11621 Type 2 diabetes mellitus with foot ulcer: Secondary | ICD-10-CM | POA: Diagnosis not present

## 2017-10-28 DIAGNOSIS — L97511 Non-pressure chronic ulcer of other part of right foot limited to breakdown of skin: Secondary | ICD-10-CM | POA: Diagnosis not present

## 2017-10-28 DIAGNOSIS — G4733 Obstructive sleep apnea (adult) (pediatric): Secondary | ICD-10-CM | POA: Diagnosis not present

## 2017-10-28 DIAGNOSIS — Z4781 Encounter for orthopedic aftercare following surgical amputation: Secondary | ICD-10-CM | POA: Diagnosis not present

## 2017-10-28 DIAGNOSIS — E11621 Type 2 diabetes mellitus with foot ulcer: Secondary | ICD-10-CM | POA: Diagnosis not present

## 2017-10-28 DIAGNOSIS — E669 Obesity, unspecified: Secondary | ICD-10-CM | POA: Diagnosis not present

## 2017-10-28 DIAGNOSIS — I10 Essential (primary) hypertension: Secondary | ICD-10-CM | POA: Diagnosis not present

## 2017-10-28 DIAGNOSIS — F329 Major depressive disorder, single episode, unspecified: Secondary | ICD-10-CM | POA: Diagnosis not present

## 2017-10-28 DIAGNOSIS — E1151 Type 2 diabetes mellitus with diabetic peripheral angiopathy without gangrene: Secondary | ICD-10-CM | POA: Diagnosis not present

## 2017-10-28 DIAGNOSIS — Z794 Long term (current) use of insulin: Secondary | ICD-10-CM | POA: Diagnosis not present

## 2017-10-30 ENCOUNTER — Telehealth: Payer: Self-pay

## 2017-10-30 NOTE — Telephone Encounter (Signed)
Returned call to Lawson FiscalLori, nurse from [facility], who wanted to let us know that patient's 4th toe on her right foot was looking a little more necrosed with a little more swelling at the base but still intact. Patient has appointment here for lab study on 1/29 with an appointment to see Dr. Randie Heinzain on 2/1. Lawson FiscalLori is going to make another visit to see the patient Friday and if there are any changes to the toe or foot she will call us.

## 2017-10-31 ENCOUNTER — Telehealth: Payer: Self-pay

## 2017-10-31 DIAGNOSIS — E669 Obesity, unspecified: Secondary | ICD-10-CM | POA: Diagnosis not present

## 2017-10-31 DIAGNOSIS — E11621 Type 2 diabetes mellitus with foot ulcer: Secondary | ICD-10-CM | POA: Diagnosis not present

## 2017-10-31 DIAGNOSIS — F329 Major depressive disorder, single episode, unspecified: Secondary | ICD-10-CM | POA: Diagnosis not present

## 2017-10-31 DIAGNOSIS — E1151 Type 2 diabetes mellitus with diabetic peripheral angiopathy without gangrene: Secondary | ICD-10-CM | POA: Diagnosis not present

## 2017-10-31 DIAGNOSIS — I10 Essential (primary) hypertension: Secondary | ICD-10-CM | POA: Diagnosis not present

## 2017-10-31 DIAGNOSIS — Z4781 Encounter for orthopedic aftercare following surgical amputation: Secondary | ICD-10-CM | POA: Diagnosis not present

## 2017-10-31 DIAGNOSIS — Z794 Long term (current) use of insulin: Secondary | ICD-10-CM | POA: Diagnosis not present

## 2017-10-31 DIAGNOSIS — G4733 Obstructive sleep apnea (adult) (pediatric): Secondary | ICD-10-CM | POA: Diagnosis not present

## 2017-10-31 DIAGNOSIS — L97511 Non-pressure chronic ulcer of other part of right foot limited to breakdown of skin: Secondary | ICD-10-CM | POA: Diagnosis not present

## 2017-10-31 NOTE — Telephone Encounter (Signed)
Lawson FiscalLori, from [facility] called to let us know that she received a call from the patient today to tell her that her toe was worse with increased pain and pressure. Lawson FiscalLori went and saw patient and she reports that the toe does look worse. Increased redness and now there is a sore formed between the toes. She feels patient needs to be seen tomorrow. Will make appointment.

## 2017-11-01 ENCOUNTER — Encounter: Payer: Self-pay | Admitting: *Deleted

## 2017-11-01 ENCOUNTER — Other Ambulatory Visit: Payer: Self-pay | Admitting: *Deleted

## 2017-11-01 ENCOUNTER — Ambulatory Visit (INDEPENDENT_AMBULATORY_CARE_PROVIDER_SITE_OTHER): Payer: Medicare HMO | Admitting: Vascular Surgery

## 2017-11-01 ENCOUNTER — Encounter: Payer: Self-pay | Admitting: Vascular Surgery

## 2017-11-01 DIAGNOSIS — I70269 Atherosclerosis of native arteries of extremities with gangrene, unspecified extremity: Secondary | ICD-10-CM

## 2017-11-01 DIAGNOSIS — I70261 Atherosclerosis of native arteries of extremities with gangrene, right leg: Secondary | ICD-10-CM | POA: Diagnosis not present

## 2017-11-01 HISTORY — DX: Atherosclerosis of native arteries of extremities with gangrene, unspecified extremity: I70.269

## 2017-11-01 MED ORDER — CEPHALEXIN 500 MG PO CAPS: 500.0000 mg | ORAL_CAPSULE | Freq: Three times a day (TID) | ORAL | 0 refills | Status: DC

## 2017-11-01 NOTE — Progress Notes (Signed)
SDW-pre-op instructions left on pt voice mailbox according to pre-op checklist; please complete pt assessments on DOS. Pt instructed to stop taking vitamins, fish oil, Melatonin and herbal medications. Do not take any NSAIDs ie: Ibuprofen, Advil, Naproxen (Aleve), Motrin, BC and Goody Powder. Pt instructed to not take insulin the morning of procedure. Pt made aware to check BG every 2 hours prior to arrival to hospital on DOS. Pt made aware to treat a BG < 70 with 4 glucose tabs or glucose gel or 4 ounces of apple or cranberry juice, wait 15 minutes after intervention to recheck BG, if BG remains < 70, call Short Stay unit to speak with a nurse.

## 2017-11-01 NOTE — Progress Notes (Signed)
Reviewed pre-op instructions with patient and family (per letter of instructions). Dr. Imogene Burnhen stated that patient could stay on Plavix. Message left for PST that patient late add on for 11/05/17 and needed to be called.

## 2017-11-01 NOTE — Progress Notes (Signed)
Postoperative Visit (Angio)   History of Present Illness   Danielle Buchanan is a 62 y.o. female who presents cc:  R 4th toe infected.  Pt recently underwent PTA R PT wit Dr. Randie Heinzain for R 4th toe gangrene.  Over the last few days, patient is now getting some drainage.  She denies any fever/chills.  She also thinks the toe may be falling off.  Past Medical History, Past Surgical History, Social History, Family History, Medications, Allergies, and Review of Systems are unchanged from previous evaluation on 10/09/17.  Current Outpatient Medications  Medication Sig Dispense Refill  . amitriptyline (ELAVIL) 25 MG tablet Take 25 mg by mouth at bedtime.    Marland Kitchen. aspirin EC 325 MG tablet Take 325 mg by mouth daily.    . clopidogrel (PLAVIX) 75 MG tablet Take 1 tablet (75 mg total) by mouth daily. 30 tablet 3  . insulin NPH-regular Human (NOVOLIN 70/30) (70-30) 100 UNIT/ML injection Inject 60 Units into the skin 2 (two) times daily with a meal.     . losartan (COZAAR) 100 MG tablet Take 100 mg by mouth daily.    . Melatonin 10 MG TABS Take 20 mg by mouth at bedtime.    . nicotine polacrilex (NICORELIEF) 2 MG gum Take 2 mg by mouth as needed for smoking cessation.    Marland Kitchen. oxyCODONE-acetaminophen (PERCOCET/ROXICET) 5-325 MG tablet Take 1 tablet by mouth every 6 (six) hours as needed for moderate pain. 30 tablet 0  . pravastatin (PRAVACHOL) 20 MG tablet Take 20 mg by mouth daily.     . sertraline (ZOLOFT) 100 MG tablet Take 150 mg by mouth daily.     Marland Kitchen. sulfamethoxazole-trimethoprim (BACTRIM DS,SEPTRA DS) 800-160 MG tablet TAKE 1 TABLET BY MOUTH EVERY 12 HOURS 14 tablet 0  . traZODone (DESYREL) 50 MG tablet Take 50 mg by mouth at bedtime.    Marland Kitchen. VITAMIN E PO Take 180 mg by mouth daily.    . Zinc 50 MG TABS Take 50 mg by mouth daily.    . cephALEXin (KEFLEX) 500 MG capsule Take 1 capsule (500 mg total) by mouth 3 (three) times daily. 21 capsule 0   No current facility-administered medications for this visit.      ROS: no fever or chills, +drainage from R 4th toe   For VQI Use Only   PRE-ADM LIVING: Home  AMB STATUS: Wheelchair   Physical Examination   Vitals:   11/01/17 1559  BP: 130/81  Pulse: 85  Resp: 16  Temp: 98 F (36.7 C)  TempSrc: Oral  SpO2: 94%  Weight: 200 lb (90.7 kg)  Height: 5\' 3"  (1.6 m)   Body mass index is 35.43 kg/m.  General Alert, O x 3, WD, NAD  Pulmonary Sym exp, good B air movt, CTA B  Cardiac RRR, Nl S1, S2, no Murmurs,   Vascular No palpable pulses in R foot  Gastrointestinal Soft, NTND, -G/R  Musculoskeletal R foot: 4th distal phalange with dry gangrene and somewhat mobile, some pus vs liquified fat draining from interphalangeal joint, edema and erythema in proximal phalange, no forefoot or plantar erythema  Neurologic  Pain and light touch somewhat decreased in R foot, Motor exam as listed above    Medical Decision Making   Danielle Buchanan is a 62 y.o. female who presents s/p R PTA PT, R 4th toe gangrene.   Keflex 500 mg 1 PO TID x 7 days  I recommended: R 4th toe amputation. I discussed in depth the  nature of toe amputation with the patient, including risks, benefits, and alternatives.   The patient is aware that the risks of toe amputation include but are not limited to: bleeding, infection, myocardial infarction, stroke, death, failure to heal amputation wound, and possible need for more proximal amputation.   The patient is aware of the risks and agrees proceed forward with the procedure. I have scheduled this with [name] this coming Monday, 28 JAN 19  Thank you for allowing [place] to participate in this patient's care.   Leonides Sake, MD, FACS [facility] Office: [phone] Pager: [phone]

## 2017-11-02 DIAGNOSIS — F329 Major depressive disorder, single episode, unspecified: Secondary | ICD-10-CM | POA: Diagnosis not present

## 2017-11-02 DIAGNOSIS — Z4781 Encounter for orthopedic aftercare following surgical amputation: Secondary | ICD-10-CM | POA: Diagnosis not present

## 2017-11-02 DIAGNOSIS — E11621 Type 2 diabetes mellitus with foot ulcer: Secondary | ICD-10-CM | POA: Diagnosis not present

## 2017-11-02 DIAGNOSIS — G4733 Obstructive sleep apnea (adult) (pediatric): Secondary | ICD-10-CM | POA: Diagnosis not present

## 2017-11-02 DIAGNOSIS — E1151 Type 2 diabetes mellitus with diabetic peripheral angiopathy without gangrene: Secondary | ICD-10-CM | POA: Diagnosis not present

## 2017-11-02 DIAGNOSIS — Z794 Long term (current) use of insulin: Secondary | ICD-10-CM | POA: Diagnosis not present

## 2017-11-02 DIAGNOSIS — I10 Essential (primary) hypertension: Secondary | ICD-10-CM | POA: Diagnosis not present

## 2017-11-02 DIAGNOSIS — L97511 Non-pressure chronic ulcer of other part of right foot limited to breakdown of skin: Secondary | ICD-10-CM | POA: Diagnosis not present

## 2017-11-02 DIAGNOSIS — E669 Obesity, unspecified: Secondary | ICD-10-CM | POA: Diagnosis not present

## 2017-11-04 ENCOUNTER — Ambulatory Visit (HOSPITAL_COMMUNITY): Payer: Medicare HMO | Admitting: Anesthesiology

## 2017-11-04 ENCOUNTER — Telehealth: Payer: Self-pay | Admitting: Vascular Surgery

## 2017-11-04 ENCOUNTER — Encounter (HOSPITAL_COMMUNITY): Payer: Self-pay | Admitting: *Deleted

## 2017-11-04 ENCOUNTER — Ambulatory Visit (HOSPITAL_COMMUNITY)
Admission: RE | Admit: 2017-11-04 | Discharge: 2017-11-04 | Disposition: A | Discharge status: Home / Self Care | Payer: Medicare HMO | Source: Ambulatory Visit | Attending: Vascular Surgery | Admitting: Vascular Surgery

## 2017-11-04 ENCOUNTER — Encounter (HOSPITAL_COMMUNITY)
Admission: RE | Disposition: A | Discharge status: Home / Self Care | Payer: Self-pay | Source: Ambulatory Visit | Attending: Vascular Surgery

## 2017-11-04 DIAGNOSIS — Z87891 Personal history of nicotine dependence: Secondary | ICD-10-CM | POA: Diagnosis not present

## 2017-11-04 DIAGNOSIS — G4733 Obstructive sleep apnea (adult) (pediatric): Secondary | ICD-10-CM | POA: Diagnosis not present

## 2017-11-04 DIAGNOSIS — F329 Major depressive disorder, single episode, unspecified: Secondary | ICD-10-CM | POA: Diagnosis not present

## 2017-11-04 DIAGNOSIS — D649 Anemia, unspecified: Secondary | ICD-10-CM | POA: Diagnosis not present

## 2017-11-04 DIAGNOSIS — I10 Essential (primary) hypertension: Secondary | ICD-10-CM | POA: Diagnosis not present

## 2017-11-04 DIAGNOSIS — Z6835 Body mass index (BMI) 35.0-35.9, adult: Secondary | ICD-10-CM | POA: Diagnosis not present

## 2017-11-04 DIAGNOSIS — G4730 Sleep apnea, unspecified: Secondary | ICD-10-CM | POA: Insufficient documentation

## 2017-11-04 DIAGNOSIS — I96 Gangrene, not elsewhere classified: Secondary | ICD-10-CM | POA: Diagnosis not present

## 2017-11-04 DIAGNOSIS — Z794 Long term (current) use of insulin: Secondary | ICD-10-CM | POA: Diagnosis not present

## 2017-11-04 DIAGNOSIS — E1152 Type 2 diabetes mellitus with diabetic peripheral angiopathy with gangrene: Secondary | ICD-10-CM | POA: Insufficient documentation

## 2017-11-04 DIAGNOSIS — E669 Obesity, unspecified: Secondary | ICD-10-CM | POA: Diagnosis not present

## 2017-11-04 DIAGNOSIS — Z79899 Other long term (current) drug therapy: Secondary | ICD-10-CM | POA: Insufficient documentation

## 2017-11-04 DIAGNOSIS — J309 Allergic rhinitis, unspecified: Secondary | ICD-10-CM | POA: Diagnosis not present

## 2017-11-04 HISTORY — PX: AMPUTATION: SHX166

## 2017-11-04 LAB — COMPREHENSIVE METABOLIC PANEL
ALK PHOS: 74 U/L (ref 38–126)
ALT: 20 U/L (ref 14–54)
AST: 20 U/L (ref 15–41)
Albumin: 3.6 g/dL (ref 3.5–5.0)
Anion gap: 12 (ref 5–15)
BUN: 11 mg/dL (ref 6–20)
CALCIUM: 9.2 mg/dL (ref 8.9–10.3)
CHLORIDE: 96 mmol/L — AB (ref 101–111)
CO2: 24 mmol/L (ref 22–32)
Creatinine, Ser: 0.73 mg/dL (ref 0.44–1.00)
GFR calc non Af Amer: >60 mL/min (ref >60)
Glucose, Bld: 325 mg/dL — ABNORMAL HIGH (ref 65–99)
Potassium: 5 mmol/L (ref 3.5–5.1)
SODIUM: 132 mmol/L — AB (ref 135–145)
Total Bilirubin: 0.5 mg/dL (ref 0.3–1.2)
Total Protein: 6.6 g/dL (ref 6.5–8.1)

## 2017-11-04 LAB — CBC
HCT: 37.6 % (ref 36.0–46.0)
Hemoglobin: 11.9 g/dL — ABNORMAL LOW (ref 12.0–15.0)
MCH: 26 pg (ref 26.0–34.0)
MCHC: 31.6 g/dL (ref 30.0–36.0)
MCV: 82.1 fL (ref 78.0–100.0)
PLATELETS: 175 10*3/uL (ref 150–400)
RBC: 4.58 MIL/uL (ref 3.87–5.11)
RDW: 14.8 % (ref 11.5–15.5)
WBC: 5.7 10*3/uL (ref 4.0–10.5)

## 2017-11-04 LAB — HEMOGLOBIN A1C
Hgb A1c MFr Bld: 8.1 % — ABNORMAL HIGH (ref 4.8–5.6)
MEAN PLASMA GLUCOSE: 185.77 mg/dL

## 2017-11-04 LAB — GLUCOSE, CAPILLARY
GLUCOSE-CAPILLARY: 284 mg/dL — AB (ref 65–99)
Glucose-Capillary: 241 mg/dL — ABNORMAL HIGH (ref 65–99)

## 2017-11-04 SURGERY — AMPUTATION DIGIT
Anesthesia: General | Site: Foot | Laterality: Right

## 2017-11-04 MED ORDER — ONDANSETRON HCL 4 MG/2ML IJ SOLN
INTRAMUSCULAR | Status: AC
  Filled 2017-11-04: qty 2

## 2017-11-04 MED ORDER — DEXTROSE 5 % IV SOLN
1.5000 g | INTRAVENOUS | Status: AC
  Administered 2017-11-04: 1.5 g via INTRAVENOUS
  Filled 2017-11-04: qty 1.5

## 2017-11-04 MED ORDER — LIDOCAINE HCL (CARDIAC) 20 MG/ML IV SOLN
INTRAVENOUS | Status: DC | PRN
  Administered 2017-11-04: 100 mg via INTRAVENOUS

## 2017-11-04 MED ORDER — CHLORHEXIDINE GLUCONATE CLOTH 2 % EX PADS: 6.0000 | MEDICATED_PAD | Freq: Once | CUTANEOUS | Status: DC

## 2017-11-04 MED ORDER — FENTANYL CITRATE (PF) 100 MCG/2ML IJ SOLN
INTRAMUSCULAR | Status: DC | PRN
  Administered 2017-11-04: 25 ug via INTRAVENOUS

## 2017-11-04 MED ORDER — OXYCODONE-ACETAMINOPHEN 5-325 MG PO TABS: 1.0000 | ORAL_TABLET | Freq: Four times a day (QID) | ORAL | 0 refills | Status: DC | PRN

## 2017-11-04 MED ORDER — MIDAZOLAM HCL 2 MG/2ML IJ SOLN
INTRAMUSCULAR | Status: AC
  Filled 2017-11-04: qty 2

## 2017-11-04 MED ORDER — ONDANSETRON HCL 4 MG/2ML IJ SOLN
INTRAMUSCULAR | Status: DC | PRN
  Administered 2017-11-04: 4 mg via INTRAVENOUS

## 2017-11-04 MED ORDER — PHENYLEPHRINE HCL 10 MG/ML IJ SOLN
INTRAMUSCULAR | Status: DC | PRN
  Administered 2017-11-04 (×2): 40 ug via INTRAVENOUS
  Administered 2017-11-04 (×2): 80 ug via INTRAVENOUS

## 2017-11-04 MED ORDER — ONDANSETRON HCL 4 MG/2ML IJ SOLN: 4.0000 mg | Freq: Once | INTRAMUSCULAR | Status: DC | PRN

## 2017-11-04 MED ORDER — LIDOCAINE HCL (PF) 1 % IJ SOLN
INTRAMUSCULAR | Status: AC
  Filled 2017-11-04: qty 30

## 2017-11-04 MED ORDER — MIDAZOLAM HCL 2 MG/2ML IJ SOLN
INTRAMUSCULAR | Status: DC | PRN
  Administered 2017-11-04: 2 mg via INTRAVENOUS

## 2017-11-04 MED ORDER — FENTANYL CITRATE (PF) 250 MCG/5ML IJ SOLN
INTRAMUSCULAR | Status: AC
  Filled 2017-11-04: qty 5

## 2017-11-04 MED ORDER — LACTATED RINGERS IV SOLN
INTRAVENOUS | Status: DC
  Administered 2017-11-04 (×2): via INTRAVENOUS

## 2017-11-04 MED ORDER — FENTANYL CITRATE (PF) 100 MCG/2ML IJ SOLN
INTRAMUSCULAR | Status: AC
  Filled 2017-11-04: qty 2

## 2017-11-04 MED ORDER — PHENYLEPHRINE 40 MCG/ML (10ML) SYRINGE FOR IV PUSH (FOR BLOOD PRESSURE SUPPORT)
PREFILLED_SYRINGE | INTRAVENOUS | Status: AC
  Filled 2017-11-04: qty 10

## 2017-11-04 MED ORDER — FENTANYL CITRATE (PF) 100 MCG/2ML IJ SOLN: 25.0000 ug | INTRAMUSCULAR | Status: DC | PRN

## 2017-11-04 MED ORDER — 0.9 % SODIUM CHLORIDE (POUR BTL) OPTIME
TOPICAL | Status: DC | PRN
  Administered 2017-11-04: 1000 mL

## 2017-11-04 MED ORDER — PROPOFOL 10 MG/ML IV BOLUS
INTRAVENOUS | Status: DC | PRN
  Administered 2017-11-04: 150 mg via INTRAVENOUS

## 2017-11-04 MED ORDER — SODIUM CHLORIDE 0.9 % IV SOLN: INTRAVENOUS | Status: DC

## 2017-11-04 MED ORDER — LIDOCAINE HCL (PF) 1 % IJ SOLN
INTRAMUSCULAR | Status: DC | PRN
  Administered 2017-11-04: 10 mL

## 2017-11-04 SURGICAL SUPPLY — 29 items
BANDAGE ACE 4X5 VEL STRL LF (GAUZE/BANDAGES/DRESSINGS) ×3 IMPLANT
BLADE AVERAGE 25MMX9MM (BLADE)
BLADE AVERAGE 25X9 (BLADE) IMPLANT
BLADE SAW SGTL 81X20 HD (BLADE) IMPLANT
BNDG GAUZE ELAST 4 BULKY (GAUZE/BANDAGES/DRESSINGS) ×3 IMPLANT
CANISTER SUCT 3000ML PPV (MISCELLANEOUS) ×3 IMPLANT
COVER SURGICAL LIGHT HANDLE (MISCELLANEOUS) ×3 IMPLANT
DRAPE EXTREMITY T 121X128X90 (DRAPE) ×3 IMPLANT
DRAPE HALF SHEET 40X57 (DRAPES) ×3 IMPLANT
ELECT REM PT RETURN 9FT ADLT (ELECTROSURGICAL) ×3
ELECTRODE REM PT RTRN 9FT ADLT (ELECTROSURGICAL) ×1 IMPLANT
GAUZE SPONGE 4X4 12PLY STRL (GAUZE/BANDAGES/DRESSINGS) ×3 IMPLANT
GAUZE SPONGE 4X4 12PLY STRL LF (GAUZE/BANDAGES/DRESSINGS) ×3 IMPLANT
GLOVE BIO SURGEON STRL SZ7.5 (GLOVE) ×3 IMPLANT
GOWN STRL REUS W/ TWL LRG LVL3 (GOWN DISPOSABLE) ×2 IMPLANT
GOWN STRL REUS W/ TWL XL LVL3 (GOWN DISPOSABLE) ×1 IMPLANT
GOWN STRL REUS W/TWL LRG LVL3 (GOWN DISPOSABLE) ×4
GOWN STRL REUS W/TWL XL LVL3 (GOWN DISPOSABLE) ×2
KIT BASIN OR (CUSTOM PROCEDURE TRAY) ×3 IMPLANT
KIT ROOM TURNOVER OR (KITS) ×3 IMPLANT
NEEDLE HYPO 25GX1X1/2 BEV (NEEDLE) IMPLANT
NS IRRIG 1000ML POUR BTL (IV SOLUTION) ×3 IMPLANT
PACK GENERAL/GYN (CUSTOM PROCEDURE TRAY) ×3 IMPLANT
PAD ARMBOARD 7.5X6 YLW CONV (MISCELLANEOUS) ×6 IMPLANT
SUT ETHILON 3 0 PS 1 (SUTURE) ×3 IMPLANT
SYR CONTROL 10ML LL (SYRINGE) IMPLANT
TOWEL GREEN STERILE (TOWEL DISPOSABLE) ×6 IMPLANT
UNDERPAD 30X30 (UNDERPADS AND DIAPERS) ×3 IMPLANT
WATER STERILE IRR 1000ML POUR (IV SOLUTION) ×3 IMPLANT

## 2017-11-04 NOTE — Progress Notes (Signed)
Orthopedic Tech Progress Note Patient Details:  Danielle ClassJanice Buchanan 1956-03-26 [id number]  Ortho Devices Type of Ortho Device: Postop shoe/boot Ortho Device/Splint Location: Pacu floor nurse requested post op shoe for pt right foot.  Post op shoe applied to pt right foot.  pt tolerated application (Right foot).  Ortho Device/Splint Interventions: Adjustment, Application   Post Interventions Patient Tolerated: Well Instructions Provided: Adjustment of device, Care of device   Alvina ChouWilliams, Flo Berroa C 11/04/2017, 2:50 PM

## 2017-11-04 NOTE — H&P (Signed)
   History and Physical Update  The patient was interviewed and re-examined.  The patient's previous History and Physical has been reviewed and is unchanged from Dr. Imogene Burnhen office note. Makes sense for toe amputation at this time. She is aware of the risks specifically non-healing that would merit more proximal amputation.  Brandon C. Randie Heinzain, MD [facility] [place] Office: [phone] Pager: [phone]   11/04/2017, 10:32 AM

## 2017-11-04 NOTE — Telephone Encounter (Signed)
Sharee PimpleMcChesney, Marilyn K, RN  P [facility] Admin Pool      Previous Messages    ----- Message -----  From: Maeola Harmanain, Brandon Christopher, MD  Sent: 11/04/2017  2:19 PM  To: [facility] Charge Pool   We can cancel her appointments this week including for the vascular lab tomorrow. I would like to see her in 2 weeks or could be with Rosalita ChessmanSuzanne on my office day.

## 2017-11-04 NOTE — Progress Notes (Signed)
Call to Tammy SoursGreg - enroute to hospital will be here in 30min- post op shoe to R foot

## 2017-11-04 NOTE — Telephone Encounter (Signed)
Cxl'd 11/05/17 and 11/08/17 appts. Sched new appt 11/22/17 at 2:45 with the NP. Spoke to pt's sister to inform them of changes.

## 2017-11-04 NOTE — Transfer of Care (Signed)
Immediate Anesthesia Transfer of Care Note  Patient: Danielle Buchanan  Procedure(s) Performed: AMPUTATION RIGHT FOURTH TOE (Right Foot)  Patient Location: PACU  Anesthesia Type:General  Level of Consciousness: awake  Airway & Oxygen Therapy: Patient Spontanous Breathing and Patient connected to nasal cannula oxygen  Post-op Assessment: Report given to RN, Post -op Vital signs reviewed and stable and Patient moving all extremities X 4  Post vital signs: Reviewed and stable  Last Vitals:  Vitals:   11/04/17 0952 11/04/17 1406  BP: (!) 145/66 126/73  Pulse: 77 71  Resp: 18 15  Temp: 36.6 C   SpO2: 95% 99%    Last Pain:  Vitals:   11/04/17 0952  TempSrc: Oral      Patients Stated Pain Goal: 5 (11/04/17 1046)  Complications: No apparent anesthesia complications

## 2017-11-04 NOTE — Anesthesia Procedure Notes (Addendum)
Procedure Name: LMA Insertion Date/Time: 11/04/2017 1:35 PM Performed by: Epifanio LeschesMercer, Ridge Lafond L, CRNA Pre-anesthesia Checklist: Patient identified, Emergency Drugs available, Suction available and Patient being monitored Patient Re-evaluated:Patient Re-evaluated prior to induction Oxygen Delivery Method: Circle System Utilized Preoxygenation: Pre-oxygenation with 100% oxygen Induction Type: IV induction Ventilation: Mask ventilation without difficulty LMA: LMA inserted LMA Size: 4.0 Number of attempts: 1 Airway Equipment and Method: Bite block Placement Confirmation: positive ETCO2 Tube secured with: Tape Dental Injury: Teeth and Oropharynx as per pre-operative assessment

## 2017-11-04 NOTE — Anesthesia Preprocedure Evaluation (Addendum)
Anesthesia Evaluation  Patient identified by MRN, date of birth, ID band Patient awake    Reviewed: Allergy & Precautions, H&P , NPO status , Patient's Chart, lab work & pertinent test results  Airway Mallampati: III  TM Distance: >3 FB Neck ROM: Full    Dental no notable dental hx. (+) Teeth Intact, Dental Advisory Given   Pulmonary sleep apnea , former smoker,    Pulmonary exam normal breath sounds clear to auscultation       Cardiovascular Exercise Tolerance: Good hypertension, Pt. on medications + Peripheral Vascular Disease   Rhythm:Regular Rate:Normal     Neuro/Psych PSYCHIATRIC DISORDERS Depression negative neurological ROS     GI/Hepatic negative GI ROS, Neg liver ROS,   Endo/Other  diabetes, Type 2, Insulin DependentObesity   Renal/GU negative Renal ROS     Musculoskeletal negative musculoskeletal ROS (+)   Abdominal (+) + obese,   Peds  Hematology  (+) Blood dyscrasia (Plavix), anemia ,   Anesthesia Other Findings Day of surgery medications reviewed with the patient.  Reproductive/Obstetrics                             Anesthesia Physical  Anesthesia Plan  ASA: III  Anesthesia Plan: General   Post-op Pain Management:    Induction: Intravenous  PONV Risk Score and Plan: 3 and Ondansetron, Dexamethasone and Midazolam  Airway Management Planned: LMA  Additional Equipment:   Intra-op Plan:   Post-operative Plan: Extubation in OR  Informed Consent: I have reviewed the patients History and Physical, chart, labs and discussed the procedure including the risks, benefits and alternatives for the proposed anesthesia with the patient or authorized representative who has indicated his/her understanding and acceptance.   Dental advisory given  Plan Discussed with: CRNA  Anesthesia Plan Comments:         Anesthesia Quick Evaluation

## 2017-11-04 NOTE — Telephone Encounter (Signed)
-----   Message from Sharee PimpleMarilyn K McChesney, RN sent at 11/04/2017  2:52 PM EST ----- Regarding: 2-3 weeks postop toe amp   ----- Message ----- From: Lars Mageollins, Emma M, PA-C Sent: 11/04/2017   1:52 PM To: Vvs Charge Pool  F/U in 2-3 weeks s/p 4th toe amputation Dr. Randie Heinzain

## 2017-11-04 NOTE — Op Note (Signed)
    Patient name: Danielle Buchanan MRN: [id number] DOB: [birth year] Sex: female  11/04/2017 Pre-operative Diagnosis: necrotic right fourth toe Post-operative diagnosis:  Same Surgeon:  Apolinar JunesBrandon C. Randie Heinzain, MD Assistant: OR nurse Procedure Performed: Right 4th toe amputation  Indications: 62 year old female who has undergone revascularization of her right lower extremity as well as a left-sided above-knee amputation.  She now has a necrotic right fourth toe and is indicated for amputation.  Findings: There is a palpable posterior tibial pulse at the level of the ankle.  The distal right fourth toe was necrotic there was minimal pus present.  The proximal phalanx appeared viable and the skin approximated well.  There was good bleeding in the wound bed.   Procedure:  The patient was identified in the holding area and taken to the operating room where LMA anesthesia was induced and she was sterilely prepped and draped in the right lower extremity given antibiotics and a timeout called.  We began with injecting 1% lidocaine to 10 cc total.  I then made a circular incision around the proximal aspect of the fourth toe where we encountered some bleeding.  Distal toe was removed.  I then used a rongeur to remove the proximal phalanx.  Wound was irrigated skin edges were debrided and reapproximated with 3-0 nylon suture to a total of 4 stitches.  Patient was then allowed away from anesthesia having tolerated procedure well without immediate complication.  All counts were correct at completion.  Next  EBL 10 cc.   Vikrant Pryce C. Randie Heinzain, MD [facility] [place] Office: [phone] Pager: [phone]

## 2017-11-04 NOTE — Anesthesia Postprocedure Evaluation (Signed)
Anesthesia Post Note  Patient: Danielle Buchanan  Procedure(s) Performed: AMPUTATION RIGHT FOURTH TOE (Right Foot)     Patient location during evaluation: PACU Anesthesia Type: General Level of consciousness: awake and alert Pain management: pain level controlled Vital Signs Assessment: post-procedure vital signs reviewed and stable Respiratory status: spontaneous breathing, nonlabored ventilation and respiratory function stable Cardiovascular status: blood pressure returned to baseline and stable Postop Assessment: no apparent nausea or vomiting Anesthetic complications: no    Last Vitals:  Vitals:   11/04/17 1501 11/04/17 1530  BP:  98/72  Pulse: 75 75  Resp:    Temp:    SpO2: 93% 93%    Last Pain:  Vitals:   11/04/17 1500  TempSrc:   PainSc: 0-No pain                 Cecile HearingStephen Edward August Gosser

## 2017-11-05 ENCOUNTER — Encounter (HOSPITAL_COMMUNITY): Payer: Medicare HMO

## 2017-11-05 ENCOUNTER — Encounter (HOSPITAL_COMMUNITY): Payer: Self-pay | Admitting: Vascular Surgery

## 2017-11-05 DIAGNOSIS — I10 Essential (primary) hypertension: Secondary | ICD-10-CM | POA: Diagnosis not present

## 2017-11-05 DIAGNOSIS — E1151 Type 2 diabetes mellitus with diabetic peripheral angiopathy without gangrene: Secondary | ICD-10-CM | POA: Diagnosis not present

## 2017-11-05 DIAGNOSIS — E11621 Type 2 diabetes mellitus with foot ulcer: Secondary | ICD-10-CM | POA: Diagnosis not present

## 2017-11-05 DIAGNOSIS — Z794 Long term (current) use of insulin: Secondary | ICD-10-CM | POA: Diagnosis not present

## 2017-11-05 DIAGNOSIS — Z4781 Encounter for orthopedic aftercare following surgical amputation: Secondary | ICD-10-CM | POA: Diagnosis not present

## 2017-11-05 DIAGNOSIS — F329 Major depressive disorder, single episode, unspecified: Secondary | ICD-10-CM | POA: Diagnosis not present

## 2017-11-05 DIAGNOSIS — G4733 Obstructive sleep apnea (adult) (pediatric): Secondary | ICD-10-CM | POA: Diagnosis not present

## 2017-11-05 DIAGNOSIS — L97511 Non-pressure chronic ulcer of other part of right foot limited to breakdown of skin: Secondary | ICD-10-CM | POA: Diagnosis not present

## 2017-11-05 DIAGNOSIS — E669 Obesity, unspecified: Secondary | ICD-10-CM | POA: Diagnosis not present

## 2017-11-07 SURGERY — AMPUTATION DIGIT
Anesthesia: Choice | Laterality: Right

## 2017-11-08 ENCOUNTER — Ambulatory Visit: Payer: Medicare HMO | Admitting: Vascular Surgery

## 2017-11-08 DIAGNOSIS — E1151 Type 2 diabetes mellitus with diabetic peripheral angiopathy without gangrene: Secondary | ICD-10-CM | POA: Diagnosis not present

## 2017-11-08 DIAGNOSIS — L97511 Non-pressure chronic ulcer of other part of right foot limited to breakdown of skin: Secondary | ICD-10-CM | POA: Diagnosis not present

## 2017-11-08 DIAGNOSIS — I10 Essential (primary) hypertension: Secondary | ICD-10-CM | POA: Diagnosis not present

## 2017-11-08 DIAGNOSIS — Z4781 Encounter for orthopedic aftercare following surgical amputation: Secondary | ICD-10-CM | POA: Diagnosis not present

## 2017-11-08 DIAGNOSIS — F329 Major depressive disorder, single episode, unspecified: Secondary | ICD-10-CM | POA: Diagnosis not present

## 2017-11-08 DIAGNOSIS — E11621 Type 2 diabetes mellitus with foot ulcer: Secondary | ICD-10-CM | POA: Diagnosis not present

## 2017-11-08 DIAGNOSIS — G4733 Obstructive sleep apnea (adult) (pediatric): Secondary | ICD-10-CM | POA: Diagnosis not present

## 2017-11-08 DIAGNOSIS — E669 Obesity, unspecified: Secondary | ICD-10-CM | POA: Diagnosis not present

## 2017-11-08 DIAGNOSIS — Z794 Long term (current) use of insulin: Secondary | ICD-10-CM | POA: Diagnosis not present

## 2017-11-09 DIAGNOSIS — Z794 Long term (current) use of insulin: Secondary | ICD-10-CM | POA: Diagnosis not present

## 2017-11-09 DIAGNOSIS — E11621 Type 2 diabetes mellitus with foot ulcer: Secondary | ICD-10-CM | POA: Diagnosis not present

## 2017-11-09 DIAGNOSIS — L97511 Non-pressure chronic ulcer of other part of right foot limited to breakdown of skin: Secondary | ICD-10-CM | POA: Diagnosis not present

## 2017-11-09 DIAGNOSIS — Z4781 Encounter for orthopedic aftercare following surgical amputation: Secondary | ICD-10-CM | POA: Diagnosis not present

## 2017-11-09 DIAGNOSIS — I10 Essential (primary) hypertension: Secondary | ICD-10-CM | POA: Diagnosis not present

## 2017-11-09 DIAGNOSIS — G4733 Obstructive sleep apnea (adult) (pediatric): Secondary | ICD-10-CM | POA: Diagnosis not present

## 2017-11-09 DIAGNOSIS — F329 Major depressive disorder, single episode, unspecified: Secondary | ICD-10-CM | POA: Diagnosis not present

## 2017-11-09 DIAGNOSIS — E1151 Type 2 diabetes mellitus with diabetic peripheral angiopathy without gangrene: Secondary | ICD-10-CM | POA: Diagnosis not present

## 2017-11-09 DIAGNOSIS — E669 Obesity, unspecified: Secondary | ICD-10-CM | POA: Diagnosis not present

## 2017-11-11 DIAGNOSIS — Z89619 Acquired absence of unspecified leg above knee: Secondary | ICD-10-CM | POA: Diagnosis not present

## 2017-11-11 DIAGNOSIS — I739 Peripheral vascular disease, unspecified: Secondary | ICD-10-CM | POA: Diagnosis not present

## 2017-11-16 DIAGNOSIS — G4733 Obstructive sleep apnea (adult) (pediatric): Secondary | ICD-10-CM | POA: Diagnosis not present

## 2017-11-16 DIAGNOSIS — L97511 Non-pressure chronic ulcer of other part of right foot limited to breakdown of skin: Secondary | ICD-10-CM | POA: Diagnosis not present

## 2017-11-16 DIAGNOSIS — E669 Obesity, unspecified: Secondary | ICD-10-CM | POA: Diagnosis not present

## 2017-11-16 DIAGNOSIS — I10 Essential (primary) hypertension: Secondary | ICD-10-CM | POA: Diagnosis not present

## 2017-11-16 DIAGNOSIS — E11621 Type 2 diabetes mellitus with foot ulcer: Secondary | ICD-10-CM | POA: Diagnosis not present

## 2017-11-16 DIAGNOSIS — Z4781 Encounter for orthopedic aftercare following surgical amputation: Secondary | ICD-10-CM | POA: Diagnosis not present

## 2017-11-16 DIAGNOSIS — F329 Major depressive disorder, single episode, unspecified: Secondary | ICD-10-CM | POA: Diagnosis not present

## 2017-11-16 DIAGNOSIS — Z794 Long term (current) use of insulin: Secondary | ICD-10-CM | POA: Diagnosis not present

## 2017-11-16 DIAGNOSIS — E1151 Type 2 diabetes mellitus with diabetic peripheral angiopathy without gangrene: Secondary | ICD-10-CM | POA: Diagnosis not present

## 2017-11-20 DIAGNOSIS — Z4781 Encounter for orthopedic aftercare following surgical amputation: Secondary | ICD-10-CM | POA: Diagnosis not present

## 2017-11-20 DIAGNOSIS — L97511 Non-pressure chronic ulcer of other part of right foot limited to breakdown of skin: Secondary | ICD-10-CM | POA: Diagnosis not present

## 2017-11-20 DIAGNOSIS — E669 Obesity, unspecified: Secondary | ICD-10-CM | POA: Diagnosis not present

## 2017-11-20 DIAGNOSIS — F329 Major depressive disorder, single episode, unspecified: Secondary | ICD-10-CM | POA: Diagnosis not present

## 2017-11-20 DIAGNOSIS — I10 Essential (primary) hypertension: Secondary | ICD-10-CM | POA: Diagnosis not present

## 2017-11-20 DIAGNOSIS — E11621 Type 2 diabetes mellitus with foot ulcer: Secondary | ICD-10-CM | POA: Diagnosis not present

## 2017-11-20 DIAGNOSIS — E1151 Type 2 diabetes mellitus with diabetic peripheral angiopathy without gangrene: Secondary | ICD-10-CM | POA: Diagnosis not present

## 2017-11-20 DIAGNOSIS — Z794 Long term (current) use of insulin: Secondary | ICD-10-CM | POA: Diagnosis not present

## 2017-11-20 DIAGNOSIS — G4733 Obstructive sleep apnea (adult) (pediatric): Secondary | ICD-10-CM | POA: Diagnosis not present

## 2017-11-22 ENCOUNTER — Encounter: Payer: Self-pay | Admitting: Family

## 2017-11-22 ENCOUNTER — Other Ambulatory Visit: Payer: Self-pay

## 2017-11-22 ENCOUNTER — Ambulatory Visit (INDEPENDENT_AMBULATORY_CARE_PROVIDER_SITE_OTHER): Payer: Medicare HMO | Admitting: Family

## 2017-11-22 VITALS — BP 118/73 | HR 82 | Temp 98.2°F | Resp 20 | Ht 63.0 in | Wt 200.0 lb

## 2017-11-22 DIAGNOSIS — Z89512 Acquired absence of left leg below knee: Secondary | ICD-10-CM

## 2017-11-22 DIAGNOSIS — I779 Disorder of arteries and arterioles, unspecified: Secondary | ICD-10-CM

## 2017-11-22 NOTE — Progress Notes (Signed)
Postoperative Visit   History of Present Illness  Danielle Buchanan is a 62 y.o. female who is s/p right 4th toe amputation on 11-04-17 by [name] for necrotic right fourth toe. She has undergone revascularization of her right lower extremity as well as a left-sided above-knee amputation.  Pt returns today for 2-3 week postoperative visit.  Pt denies fever or chills, denies pain in right foot.   [name] states her blood sugars had been in the low 100's, but lately are elevated. Pt denies URI or UTI sx's.   The patient's right 4th toe amputation site is healing well, no signs of infection, sutures in place; left AKA stump would is well healed.    She is a former smoker, smoked x 51 years, quit 06-08-17.  She has uncontrolled DM, A1C was 8.1 on 11-04-17.   For VQI Use Only  PRE-ADM LIVING: Home  AMB STATUS: Wheelchair   Past Medical History:  Diagnosis Date  . Allergic rhinitis   . Depression   . Diabetes (HCC)   . Dyslipidemia   . HTN (hypertension)   . Insomnia   . OSA (obstructive sleep apnea)    no cpap  . Urinary incontinence     Past Surgical History:  Procedure Laterality Date  . ABDOMINAL AORTOGRAM W/LOWER EXTREMITY N/A 06/24/2017   Procedure: ABDOMINAL AORTOGRAM W/LOWER EXTREMITY;  Surgeon: Maeola Harman, MD;  Location: [facility] INVASIVE CV LAB;  Service: Cardiovascular;  Laterality: N/A;  . ABDOMINAL AORTOGRAM W/LOWER EXTREMITY Right 10/09/2017   Procedure: ABDOMINAL AORTOGRAM W/LOWER EXTREMITY;  Surgeon: Maeola Harman, MD;  Location: [facility] INVASIVE CV LAB;  Service: Cardiovascular;  Laterality: Right;  . AMPUTATION Left 07/23/2017   Procedure: AMPUTATION  BELOW KNEE;  Surgeon: Maeola Harman, MD;  Location: [facility] OR;  Service: Vascular;  Laterality: Left;  . AMPUTATION Left 08/12/2017   Procedure: REVISION BELOW KNEE;  Surgeon: Maeola Harman, MD;  Location: [facility] OR;  Service: Vascular;  Laterality: Left;  . AMPUTATION Left  08/27/2017   Procedure: left ABOVE KNEE AMPUTATION;  Surgeon: Maeola Harman, MD;  Location: [facility] OR;  Service: Vascular;  Laterality: Left;  . AMPUTATION Left 08/30/2017   Procedure: REVISION AMPUTATION ABOVE KNEE;  Surgeon: Nada Libman, MD;  Location: [facility] OR;  Service: Vascular;  Laterality: Left;  . AMPUTATION Right 11/04/2017   Procedure: AMPUTATION RIGHT FOURTH TOE;  Surgeon: Maeola Harman, MD;  Location: [facility] OR;  Service: Vascular;  Laterality: Right;  . APPLICATION OF WOUND VAC Left 08/27/2017   Procedure: APPLICATION OF WOUND VAC;  Surgeon: Maeola Harman, MD;  Location: [facility] OR;  Service: Vascular;  Laterality: Left;  . CARPAL TUNNEL RELEASE Right   . COLONOSCOPY    . LOWER EXTREMITY INTERVENTION Right 10/09/2017   Procedure: LOWER EXTREMITY INTERVENTION;  Surgeon: Maeola Harman, MD;  Location: [facility] INVASIVE CV LAB;  Service: Cardiovascular;  Laterality: Right;    Social History   Socioeconomic History  . Marital status: Single    Spouse name: Not on file  . Number of children: Not on file  . Years of education: Not on file  . Highest education level: Not on file  Social Needs  . Financial resource strain: Not on file  . Food insecurity - worry: Not on file  . Food insecurity - inability: Not on file  . Transportation needs - medical: Not on file  . Transportation needs - non-medical: Not on file  Occupational History  . Not on  file  Tobacco Use  . Smoking status: Former Smoker    Packs/day: 1.00    Years: 51.00    Pack years: 51.00    Types: Cigarettes    Last attempt to quit: 06/08/2017    Years since quitting: 0.4  . Smokeless tobacco: Never Used  Substance and Sexual Activity  . Alcohol use: No    Alcohol/week: 0.0 oz  . Drug use: No  . Sexual activity: Not on file  Other Topics Concern  . Not on file  Social History Narrative   Lives with sister in an apartment on the first floor.  Has no children.    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  Retired and on disability for diabetes.    Education: 3rd grade.    Allergies  Allergen Reactions  . Milk-Related Compounds     incontinence  . Doxycycline Rash    Current Outpatient Medications on File Prior to Visit  Medication Sig Dispense Refill  . amitriptyline (ELAVIL) 25 MG tablet Take 25 mg by mouth at bedtime.    Marland Kitchen. aspirin EC 325 MG tablet Take 325 mg by mouth daily.    . cephALEXin (KEFLEX) 500 MG capsule Take 1 capsule (500 mg total) by mouth 3 (three) times daily. 21 capsule 0  . clopidogrel (PLAVIX) 75 MG tablet Take 1 tablet (75 mg total) by mouth daily. 30 tablet 3  . insulin NPH-regular Human (NOVOLIN 70/30) (70-30) 100 UNIT/ML injection Inject 60 Units into the skin 2 (two) times daily with a meal.     . losartan (COZAAR) 100 MG tablet Take 100 mg by mouth daily.    . Melatonin 10 MG TABS Take 20 mg by mouth at bedtime.    . nicotine polacrilex (NICORELIEF) 2 MG gum Take 2 mg by mouth as needed for smoking cessation.    Marland Kitchen. oxyCODONE-acetaminophen (PERCOCET/ROXICET) 5-325 MG tablet Take 1 tablet by mouth every 6 (six) hours as needed for moderate pain. 20 tablet 0  . pravastatin (PRAVACHOL) 20 MG tablet Take 20 mg by mouth daily.     . sertraline (ZOLOFT) 100 MG tablet Take 150 mg by mouth daily.     . traZODone (DESYREL) 50 MG tablet Take 50 mg by mouth at bedtime.    Marland Kitchen. VITAMIN E PO Take 180 mg by mouth daily.    . Zinc 50 MG TABS Take 50 mg by mouth daily.     No current facility-administered medications on file prior to visit.       Physical Examination  Vitals:   11/22/17 1445  BP: 118/73  Pulse: 82  Resp: 20  Temp: 98.2 F (36.8 C)  TempSrc: Oral  SpO2: 95%  Weight: 200 lb (90.7 kg)  Height: 5\' 3"  (1.6 m)   Body mass index is 35.43 kg/m.      Right foot, 4th toe amputation site     Right foot    Left AKA stump well healed   Right DP, PT, and peroneal pulses are not palpable but all have brisk Doppler  signals.  Left 4th toe amputation site is healing well, sutures in place.  Left AKA stump is well healed.    Medical Decision Making  Josefine ClassJanice Jhaveri is a 62 y.o. female who is s/p right 4th toe amputation on 11-04-17 by Dr. Randie Heinzain for necrotic right fourth toe. She has undergone revascularization of her right lower extremity as well as a left-sided above-knee amputation.  Dr. Randie Heinzain spoke with pt, sister, and  nephew, and examined pt.  Sutures removed from right 4th toe amputation site, advised local wound care at home.  Right foot does not appear infected, but pt home glucose readings have doubled from low 100's to over 200 according to nephew. She denies UTI or URI sx's; I advised pt and her family to see her PCP ASAP to evaluate reason for hyperglycemia.   Ace wrap compression to left AKA. Prescription for [employer] given to pt to start the process for prosthesis fitting for left AKA.  Return in 2 months for right ABI, see me afterward on a day that [name] is in the office.   Charisse March, RN, MSN, FNP-C [facility] Office: [phone]  11/22/2017, 2:50 PM  Clinic MD: Randie Heinz

## 2017-11-22 NOTE — Patient Instructions (Addendum)
Peripheral Vascular Disease Peripheral vascular disease (PVD) is a disease of the blood vessels that are not part of your heart and brain. A simple term for PVD is poor circulation. In most cases, PVD narrows the blood vessels that carry blood from your heart to the rest of your body. This can result in a decreased supply of blood to your arms, legs, and internal organs, like your stomach or kidneys. However, it most often affects a person's lower legs and feet. There are two types of PVD.  Organic PVD. This is the more common type. It is caused by damage to the structure of blood vessels.  Functional PVD. This is caused by conditions that make blood vessels contract and tighten (spasm).  Without treatment, PVD tends to get worse over time. PVD can also lead to acute ischemic limb. This is when an arm or limb suddenly has trouble getting enough blood. This is a medical emergency. Follow these instructions at home:  Take medicines only as told by your doctor.  Do not use any tobacco products, including cigarettes, chewing tobacco, or electronic cigarettes. If you need help quitting, ask your doctor.  Lose weight if you are overweight, and maintain a healthy weight as told by your doctor.  Eat a diet that is low in fat and cholesterol. If you need help, ask your doctor.  Exercise regularly. Ask your doctor for some good activities for you.  Take good care of your feet. ? Wear comfortable shoes that fit well. ? Check your feet often for any cuts or sores. Contact a doctor if:  You have cramps in your legs while walking.  You have leg pain when you are at rest.  You have coldness in a leg or foot.  Your skin changes.  You are unable to get or have an erection (erectile dysfunction).  You have cuts or sores on your feet that are not healing. Get help right away if:  Your arm or leg turns cold and blue.  Your arms or legs become red, warm, swollen, painful, or numb.  You have  chest pain or trouble breathing.  You suddenly have weakness in your face, arm, or leg.  You become very confused or you cannot speak.  You suddenly have a very bad headache.  You suddenly cannot see. This information is not intended to replace advice given to you by your health care provider. Make sure you discuss any questions you have with your health care provider. Document Released: 12/19/2009 Document Revised: 03/01/2016 Document Reviewed: 03/04/2014 Elsevier Interactive Patient Education  2017 Elsevier Inc.    Stump and Prosthesis Care When an arm or leg is removed, it is important to care for the artificial body part that replaces it (prosthesis) and for the remaining end of the arm or leg (stump). Caring for the stump and prosthesis will help you to be comfortable, active, and healthy. How to care for your stump Cleaning Your Skin  Wash your stump with a mild antibacterial soap at least once per day.  Wash your stump after getting dirty or sweaty.  After washing your stump, pat it dry and let it air-dry for 5-10 minutes.  Do not soak your stump in a warm or hot bath for longer than 20 minutes at a time.  Avoid shaving hair on the stump. Hair that grows out after being shaved is more easily irritated by the prosthesis. Using Skin Care Products  Apply ointment to your surgical scar if your health care  provider instructed you to do so. This can keep the scar soft and help it heal.  Do not put creams and lotions on your stump unless your health care provider says it is okay. If your health care provider says it is okay to put creams and lotions on your stump, do not use lotions that contain petroleum jelly.  Do not use skin care products with an alcohol base. These products can be harmful to your skin. They can also damage the lining of the prosthesis.  Consider using an antiperspirant spray on the skin of the stump if you are prone to sweating. Other Instructions  Every  day, look closely at the skin on your stump. Use a mirror with a long handle to check areas you cannot see, or ask a friend or family member to check those areas. Look for areas that appear reddish, swollen, or irritated. Pay extra attention to places where the stump and prosthesis rub together. How to care for your prosthesis Cleaning Your Prosthesis  Use hot water and antibacterial soap to wash your prosthesis. Attaching Your Prosthesis  Make sure your prosthesis is clean before you attach it to your stump. All the parts that touch your skin should be clean and dry.  Be sure you understand how to attach the prosthesis. A prosthetic specialist (prosthetist) can show you how to do this. It is a good idea to practice several times while he or she watches.  If you were given wraps or socks to wear under the prosthesis, make sure to wear them. Other Instructions  Exercise and move your prosthesis as recommended by your physical therapist.  Follow your health care provider's instructions about the length of time you should wear your prosthesis. You will likely need to limit the amount of time you wear your prosthesis at first. You may be instructed to increase the time you wear your prosthesis a little bit each day. Contact a health care provider if:  The prosthesis does not seem to fit correctly.  You have an itchy rash or a sore on your stump.  Sweating between the stump and the prosthesis is heavy and efforts to control the sweating do not work. Get help right away if:  Your stump is red, swollen, painful to the touch, or hot.  A bad smell develops around the stump.  There is a sore on your stump that is not healing.  Your stump is colder than the upper part of the limb.  Skin on your stump turns gray or black.  There is any drainage coming from your stump. This information is not intended to replace advice given to you by your health care provider. Make sure you discuss any  questions you have with your health care provider. Document Released: 12/19/2009 Document Revised: 05/20/2016 Document Reviewed: 09/20/2014 Elsevier Interactive Patient Education  [employer]2018 Elsevier Inc.

## 2017-11-23 IMAGING — CR DG FOOT COMPLETE 3+V*L*
3 series · 3 of 3 positions shown · non-contrast
Comparison: None.

CLINICAL DATA: Bilateral foot infection. Diabetic foot ulcers for 2
months.

EXAM:
LEFT FOOT - COMPLETE 3+ VIEW

[foot ap]
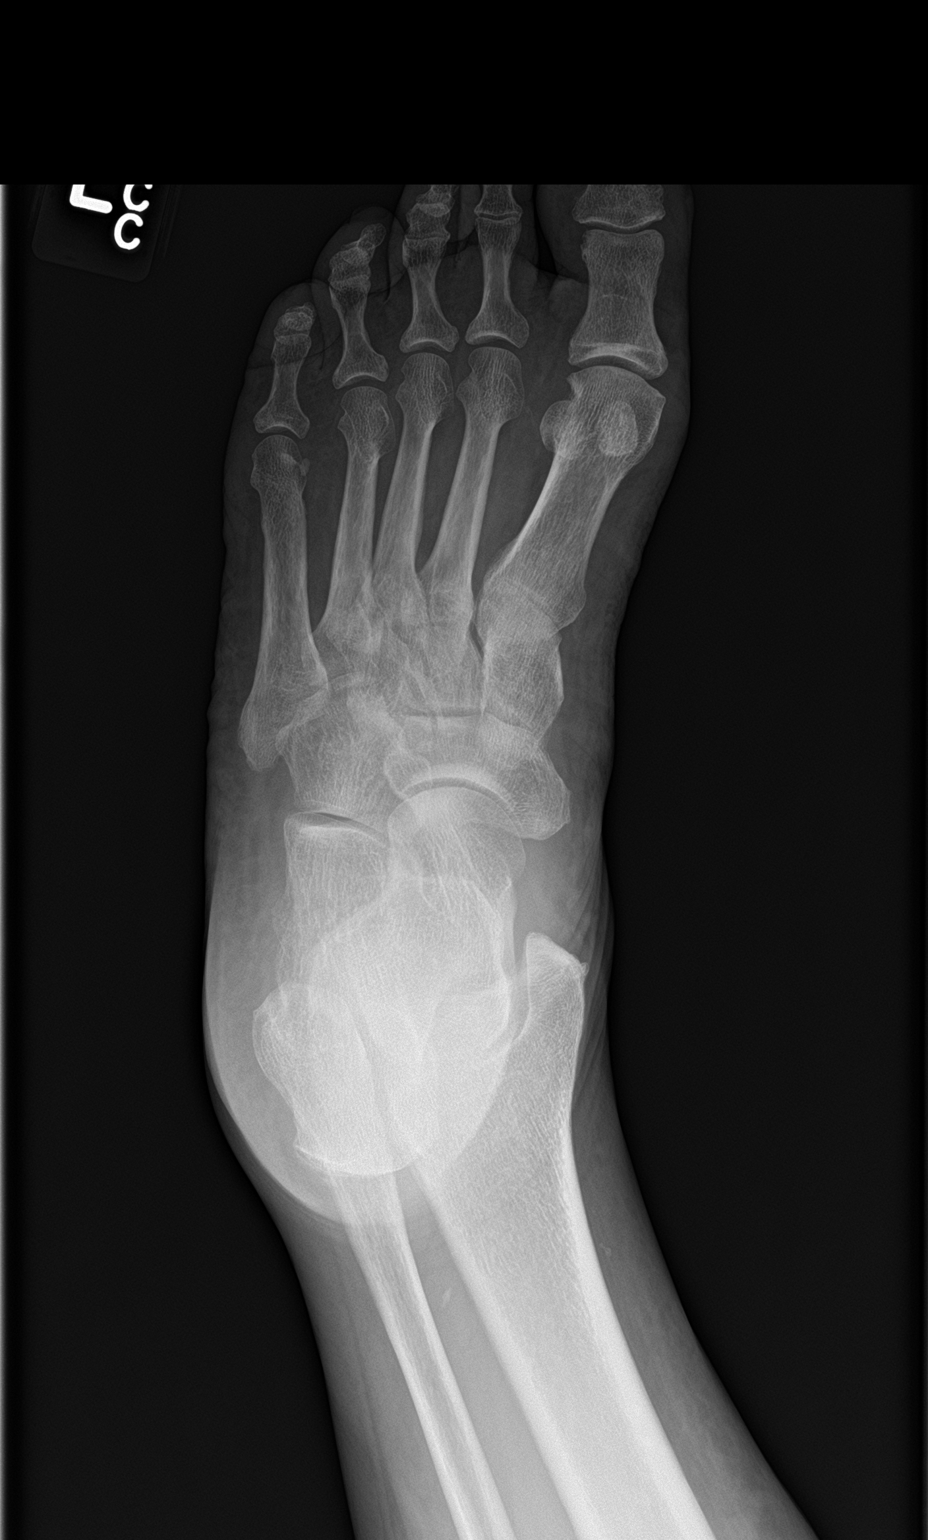

[foot obl]
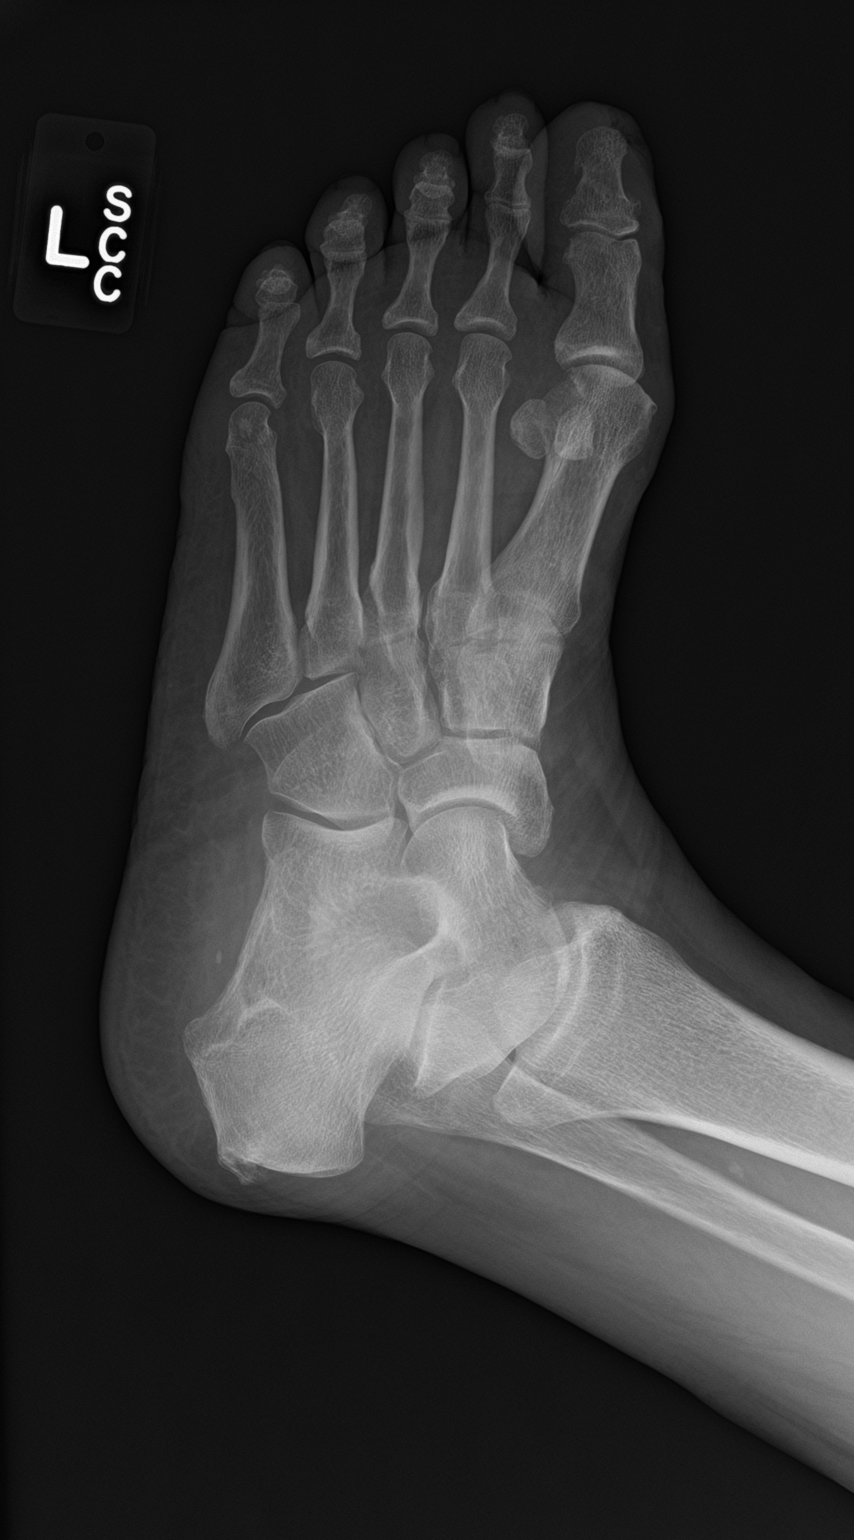

[foot lat]
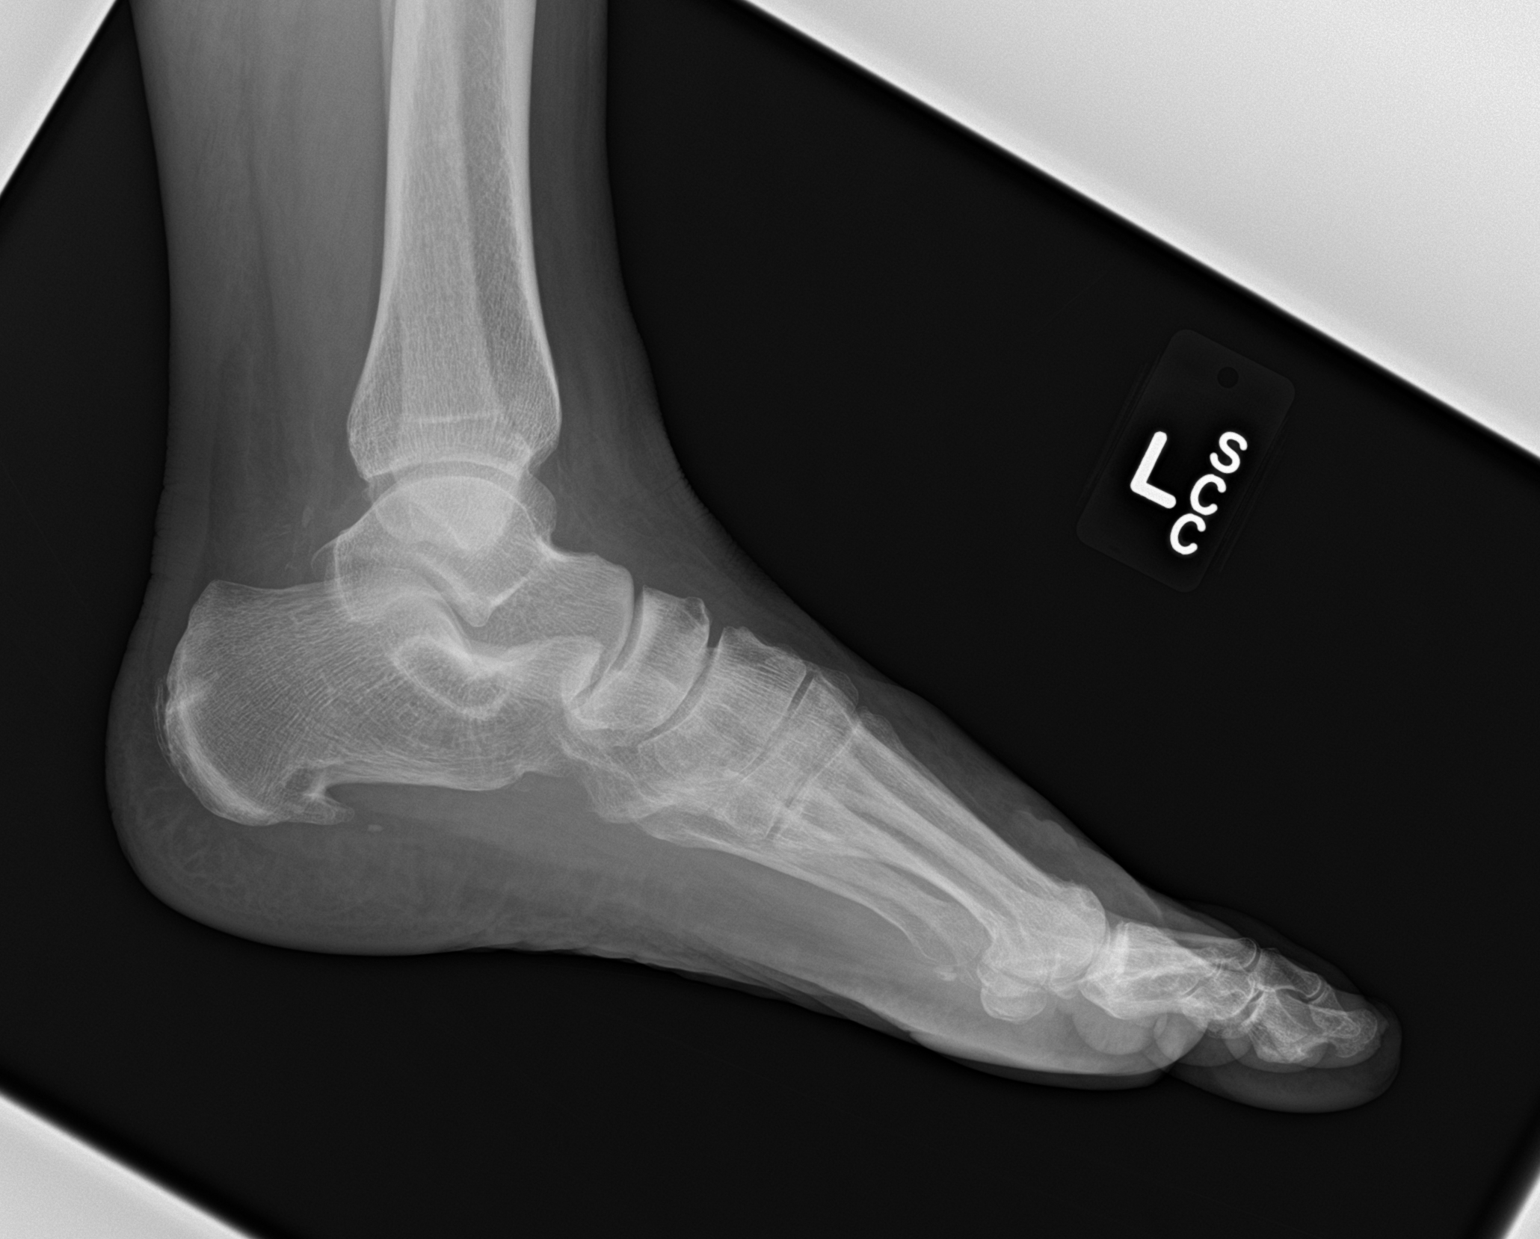

[3 of 3 positions shown; findings below may reference images not displayed]

FINDINGS: Probable ulcer about the distal aspect of the great toe. No tracking
soft tissue air. No radiopaque foreign body. No associated
periosteal reaction or bony destructive change. No additional soft
tissue ulcer is seen radiographically. Plantar calcaneal spur. No
fracture or dislocation.
IMPRESSION: Probable ulcer about the distal great toe. No radiographic evidence
of osteomyelitis.

## 2017-11-24 IMAGING — DX DG CHEST 2V
3 series · 3 of 3 positions shown · non-contrast
Comparison: None.

CLINICAL DATA: Fever.

EXAM:
CHEST  2 VIEW

[chest lat (1 of 2)]
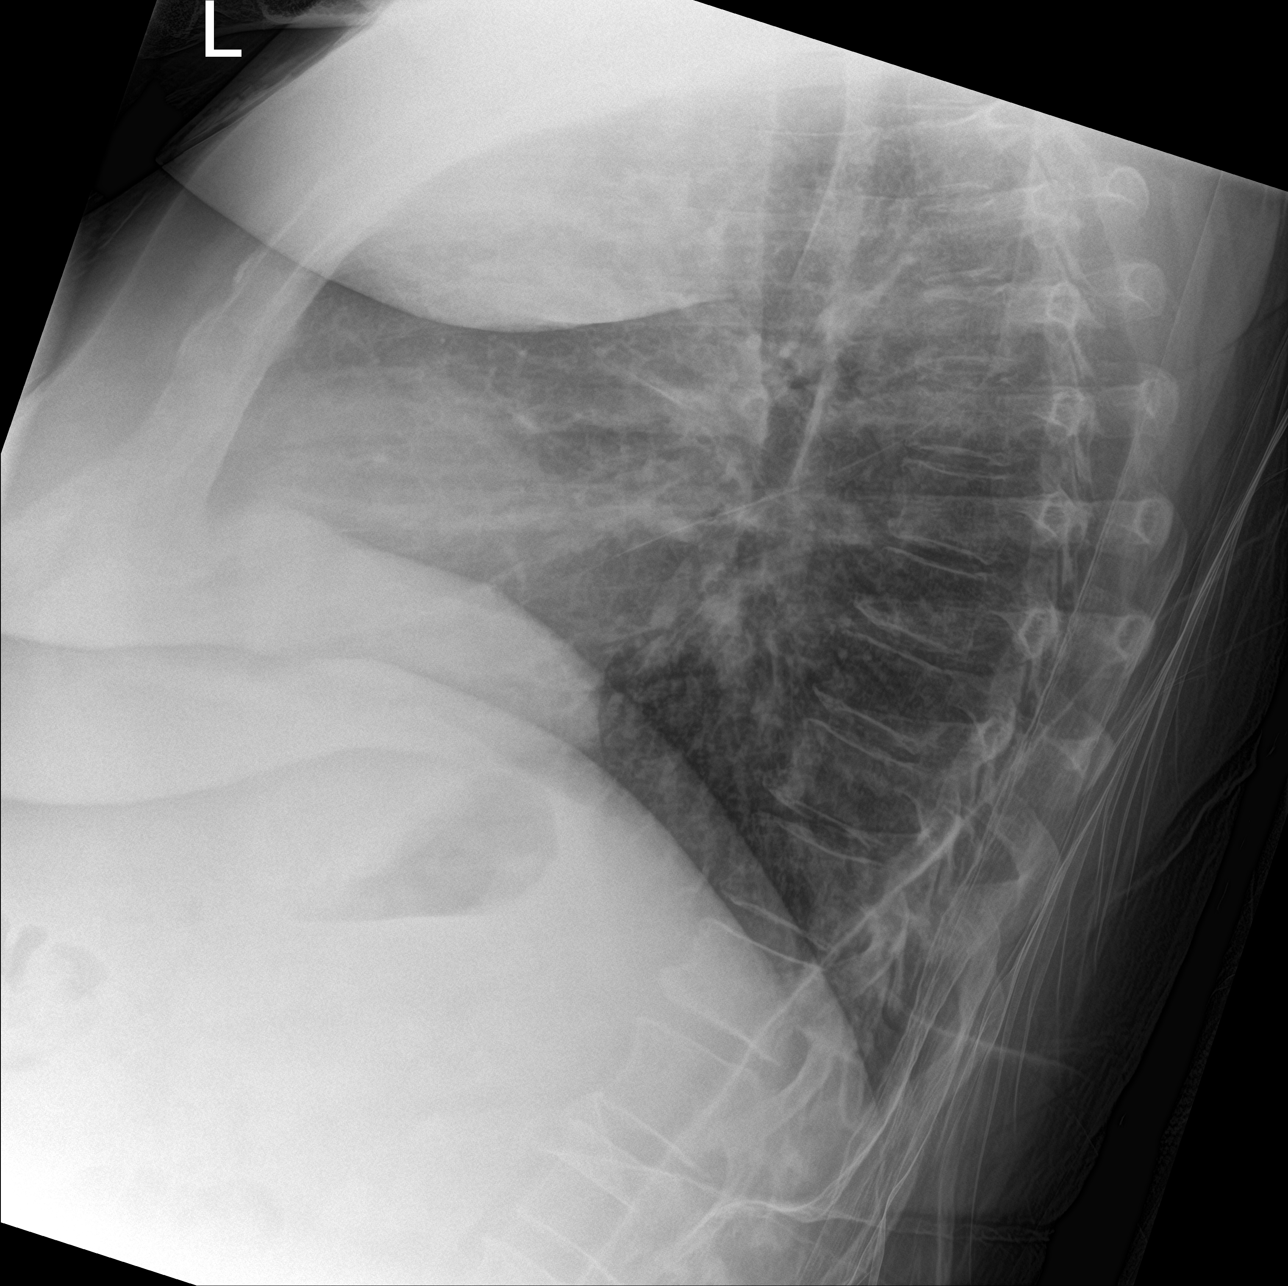

[chest ap]
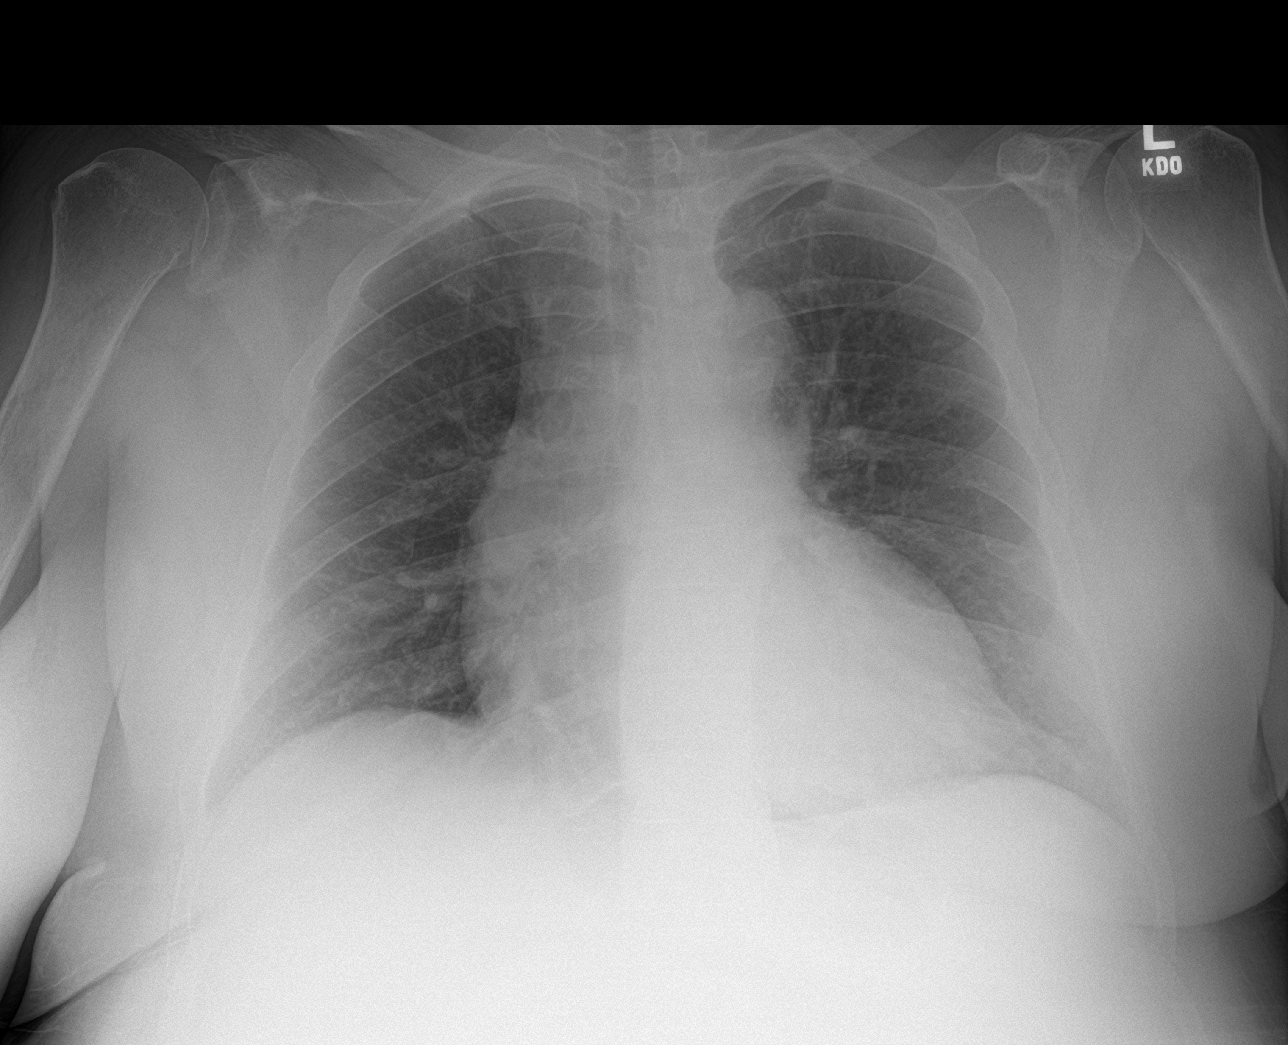

[chest lat (2 of 2)]
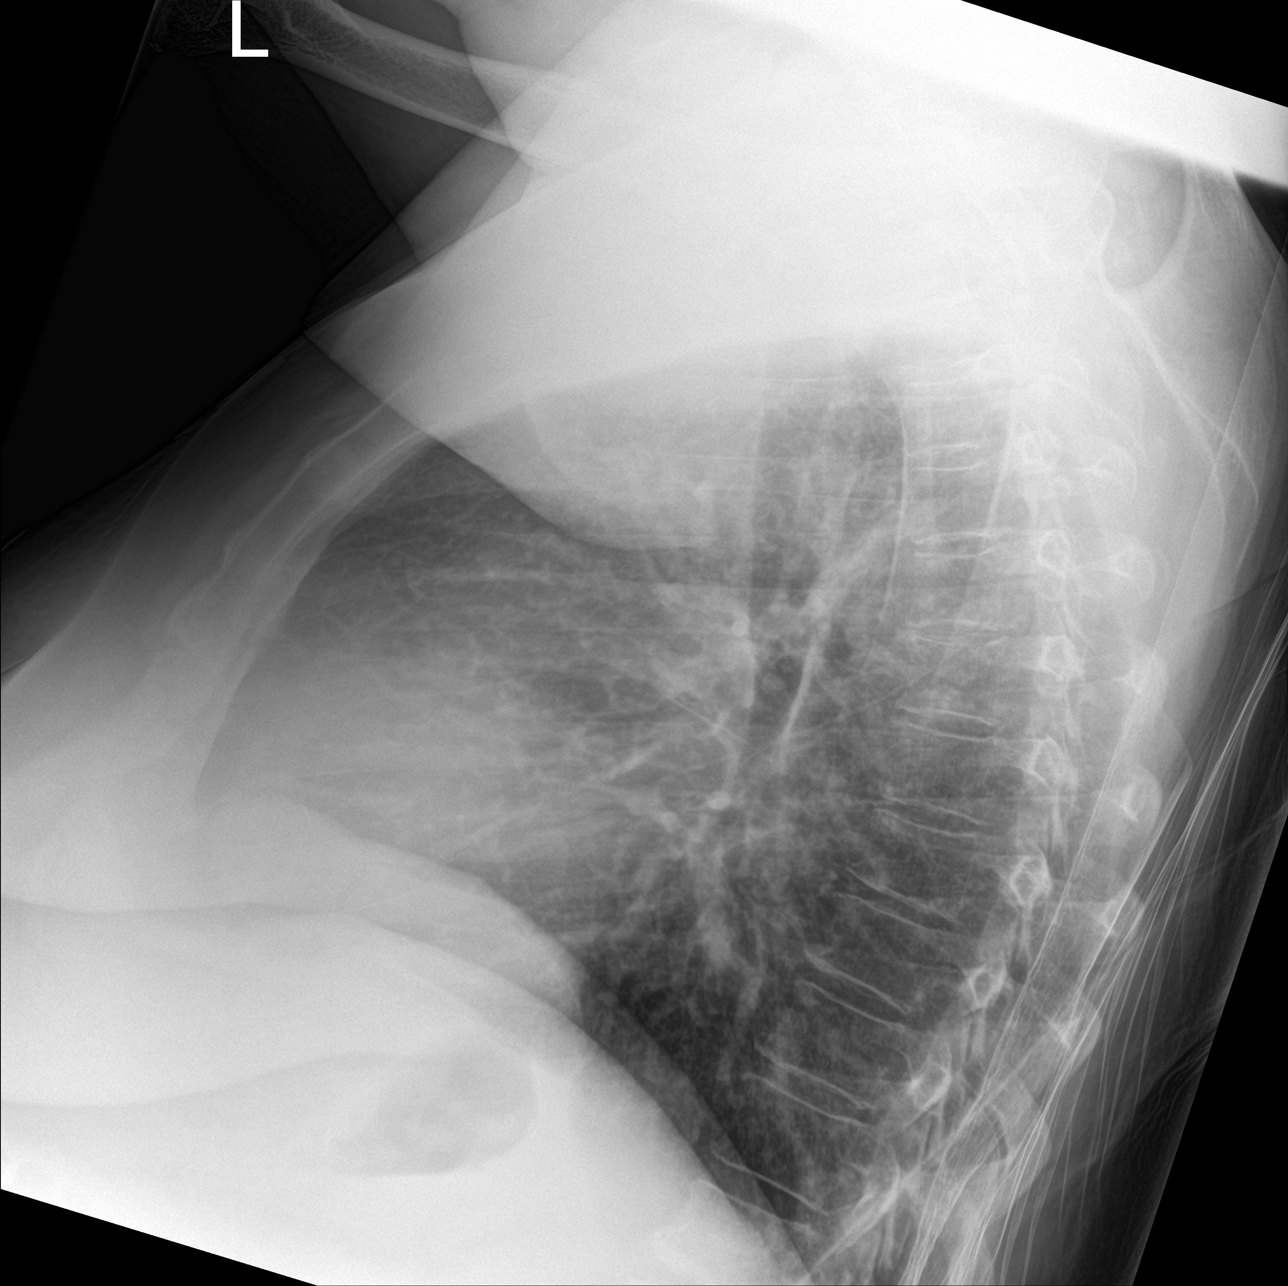

[3 of 3 positions shown; findings below may reference images not displayed]

FINDINGS: Heart is enlarged. Lung volumes are low. There is no edema or
effusion. No focal airspace consolidation is present. The visualized
soft tissues and bony thorax are unremarkable.
IMPRESSION: 1. Cardiomegaly without failure.
2. Low lung volumes.

## 2017-11-25 ENCOUNTER — Other Ambulatory Visit: Payer: Self-pay

## 2017-11-25 DIAGNOSIS — I739 Peripheral vascular disease, unspecified: Secondary | ICD-10-CM

## 2017-11-25 DIAGNOSIS — I70261 Atherosclerosis of native arteries of extremities with gangrene, right leg: Secondary | ICD-10-CM

## 2017-12-10 DIAGNOSIS — E1121 Type 2 diabetes mellitus with diabetic nephropathy: Secondary | ICD-10-CM | POA: Diagnosis not present

## 2017-12-10 DIAGNOSIS — I10 Essential (primary) hypertension: Secondary | ICD-10-CM | POA: Diagnosis not present

## 2017-12-10 DIAGNOSIS — E781 Pure hyperglyceridemia: Secondary | ICD-10-CM | POA: Diagnosis not present

## 2017-12-10 DIAGNOSIS — E782 Mixed hyperlipidemia: Secondary | ICD-10-CM | POA: Diagnosis not present

## 2017-12-13 DIAGNOSIS — Z1211 Encounter for screening for malignant neoplasm of colon: Secondary | ICD-10-CM | POA: Diagnosis not present

## 2017-12-13 DIAGNOSIS — E1121 Type 2 diabetes mellitus with diabetic nephropathy: Secondary | ICD-10-CM | POA: Diagnosis not present

## 2017-12-13 DIAGNOSIS — Z0000 Encounter for general adult medical examination without abnormal findings: Secondary | ICD-10-CM | POA: Diagnosis not present

## 2017-12-13 DIAGNOSIS — Z794 Long term (current) use of insulin: Secondary | ICD-10-CM | POA: Diagnosis not present

## 2017-12-31 DIAGNOSIS — Z89619 Acquired absence of unspecified leg above knee: Secondary | ICD-10-CM | POA: Diagnosis not present

## 2017-12-31 DIAGNOSIS — Z1211 Encounter for screening for malignant neoplasm of colon: Secondary | ICD-10-CM | POA: Diagnosis not present

## 2017-12-31 DIAGNOSIS — N644 Mastodynia: Secondary | ICD-10-CM | POA: Diagnosis not present

## 2017-12-31 DIAGNOSIS — I739 Peripheral vascular disease, unspecified: Secondary | ICD-10-CM | POA: Diagnosis not present

## 2018-01-02 ENCOUNTER — Other Ambulatory Visit: Payer: Self-pay | Admitting: Family Medicine

## 2018-01-02 DIAGNOSIS — Z89619 Acquired absence of unspecified leg above knee: Secondary | ICD-10-CM | POA: Diagnosis not present

## 2018-01-02 DIAGNOSIS — S88012A Complete traumatic amputation at knee level, left lower leg, initial encounter: Secondary | ICD-10-CM | POA: Diagnosis not present

## 2018-01-02 DIAGNOSIS — Z89612 Acquired absence of left leg above knee: Secondary | ICD-10-CM | POA: Diagnosis not present

## 2018-01-02 DIAGNOSIS — N644 Mastodynia: Secondary | ICD-10-CM

## 2018-01-02 DIAGNOSIS — T8189XA Other complications of procedures, not elsewhere classified, initial encounter: Secondary | ICD-10-CM | POA: Diagnosis not present

## 2018-01-02 DIAGNOSIS — R296 Repeated falls: Secondary | ICD-10-CM | POA: Diagnosis not present

## 2018-01-02 DIAGNOSIS — E1121 Type 2 diabetes mellitus with diabetic nephropathy: Secondary | ICD-10-CM | POA: Diagnosis not present

## 2018-01-03 ENCOUNTER — Encounter: Payer: Self-pay | Admitting: Family

## 2018-01-03 ENCOUNTER — Other Ambulatory Visit: Payer: Self-pay

## 2018-01-03 ENCOUNTER — Ambulatory Visit (HOSPITAL_COMMUNITY)
Admission: RE | Admit: 2018-01-03 | Discharge: 2018-01-03 | Disposition: A | Discharge status: Home / Self Care | Payer: Medicare HMO | Source: Ambulatory Visit | Attending: Family | Admitting: Family

## 2018-01-03 ENCOUNTER — Ambulatory Visit (INDEPENDENT_AMBULATORY_CARE_PROVIDER_SITE_OTHER): Payer: Medicare HMO | Admitting: Family

## 2018-01-03 VITALS — BP 124/75 | HR 87 | Temp 98.8°F | Resp 14 | Ht 63.0 in | Wt 200.0 lb

## 2018-01-03 DIAGNOSIS — E119 Type 2 diabetes mellitus without complications: Secondary | ICD-10-CM | POA: Insufficient documentation

## 2018-01-03 DIAGNOSIS — I739 Peripheral vascular disease, unspecified: Secondary | ICD-10-CM

## 2018-01-03 DIAGNOSIS — I70261 Atherosclerosis of native arteries of extremities with gangrene, right leg: Secondary | ICD-10-CM | POA: Diagnosis not present

## 2018-01-03 DIAGNOSIS — I10 Essential (primary) hypertension: Secondary | ICD-10-CM | POA: Insufficient documentation

## 2018-01-03 DIAGNOSIS — Z89622 Acquired absence of left hip joint: Secondary | ICD-10-CM | POA: Insufficient documentation

## 2018-01-03 DIAGNOSIS — E785 Hyperlipidemia, unspecified: Secondary | ICD-10-CM | POA: Insufficient documentation

## 2018-01-03 DIAGNOSIS — Z89612 Acquired absence of left leg above knee: Secondary | ICD-10-CM

## 2018-01-03 DIAGNOSIS — Z87891 Personal history of nicotine dependence: Secondary | ICD-10-CM | POA: Insufficient documentation

## 2018-01-03 DIAGNOSIS — I779 Disorder of arteries and arterioles, unspecified: Secondary | ICD-10-CM

## 2018-01-03 NOTE — Progress Notes (Signed)
    Postoperative Visit   History of Present Illness  Josefine ClassJanice Wanek is a 62 y.o. female who is s/p right 4th toe amputation on 11-04-17 by Dr. Randie Heinzain for necrotic right fourth toe. Shehas undergone revascularization of her right lower extremity as well as a left-sided above-knee amputation.  Pt returned on 11-22-17 for 2-3 week postoperative visit.   She returns today as an urgent referral from Dr. Shirlean Mylararol Webb with report of cool toes of right foot. Pt denies fever or chills, denies pain in right foot.  She is working with [employer] and is in the process of getting a left AKA prosthesis.  She walks well with her walker.   The patient's right 4th toe amputation site is healing well, no signs of infection, left AKA stump would is well healed.    She is a former smoker, smoked x 51 years, quit 06-08-17.  She has uncontrolled DM, A1C was 8.1 on 11-04-17.   For VQI Use Only  PRE-ADM LIVING: Home  AMB STATUS: Wheelchair    Physical Examination  Vitals:   01/03/18 1207  BP: 124/75  Pulse: 87  Resp: 14  Temp: 98.8 F (37.1 C)  TempSrc: Oral  SpO2: 95%  Weight: 200 lb (90.7 kg)  Height: 5\' 3"  (1.6 m)   Body mass index is 35.43 kg/m.    Right foot    Right foot   Right 4th toe amputation site is well healed. No signs of ischemia on right foot. Left AKA stump is swell healed.   ABI (Date: 01/03/2018):  R:   ABI: 1.22    PT: bi  DP: bi  TBI:  0.65, toe pressure 100       L: AKA Right ABI is normal with biphasic waveforms, right great toe pressure is 100  Medical Decision Making  Josefine ClassJanice Cabiness is a 62 y.o. female who is s/p right 4th toe amputation on 11-04-17 by Dr. Randie Heinzain for necrotic right fourth toe. Shehas undergone revascularization of her right lower extremity as well as a left-sided above-knee amputation.  Right ABI today is normal, excellent right great toe pressure of 100.  Right 4th toe amputation site is well healed No evidence of ischemia in the  right foot.  Left AKA stump is well healed, is using stump shrinker, in the process of obtaining left AKA prosthesis.   Dr. Randie Heinzain spoke with pt and nephew and examined pt.  Follow up in 3 months with right LE arterial duplex and right ABI.   Charisse MarchSuzanne Suezette Lafave, RN, MSN, FNP-C [facility] [place] Office: [phone]  01/03/2018, 12:18 PM  Clinic MD: Randie Heinzain

## 2018-01-08 ENCOUNTER — Encounter: Payer: Self-pay | Admitting: Endocrinology

## 2018-01-08 ENCOUNTER — Ambulatory Visit (INDEPENDENT_AMBULATORY_CARE_PROVIDER_SITE_OTHER): Payer: Medicare HMO | Admitting: Endocrinology

## 2018-01-08 VITALS — BP 148/80 | HR 85 | Temp 98.4°F | Ht 63.0 in

## 2018-01-08 DIAGNOSIS — Z794 Long term (current) use of insulin: Secondary | ICD-10-CM | POA: Diagnosis not present

## 2018-01-08 DIAGNOSIS — E1142 Type 2 diabetes mellitus with diabetic polyneuropathy: Secondary | ICD-10-CM

## 2018-01-08 LAB — POCT GLYCOSYLATED HEMOGLOBIN (HGB A1C): Hemoglobin A1C: 10.5

## 2018-01-08 MED ORDER — INSULIN NPH ISOPHANE & REGULAR (70-30) 100 UNIT/ML ~~LOC~~ SUSP: 90.0000 [IU] | Freq: Two times a day (BID) | SUBCUTANEOUS | 3 refills | Status: DC

## 2018-01-08 NOTE — Progress Notes (Signed)
Subjective:    Patient ID: Danielle Buchanan, female    DOB: Apr 03, 1956, 62 y.o.   MRN: [id number]  HPI Pt returns for f/u of diabetes mellitus: DM type: Insulin-requiring type 2 Dx'ed: 1992 Complications: polyneuropathy, left leg BKA, and PAD Therapy: insulin since 1997 GDM: never DKA: never Severe hypoglycemia: never.  Pancreatitis: never Other: she says she cannot afford insulin analogs.   Interval history: insulin was reduced after pt was in the hospital 2 mos ago.  Now, pt states cbg's vary from 180-265.  There is no trend throughout the day. She says she never misses the insulin.  she takes 70 units BID.  She says a vial lasts approx 1 week.  She feels the insulin is not leaking out from the injection site.   Past Medical History:  Diagnosis Date  . Allergic rhinitis   . Depression   . Diabetes (HCC)   . Dyslipidemia   . HTN (hypertension)   . Insomnia   . OSA (obstructive sleep apnea)    no cpap  . Urinary incontinence     Past Surgical History:  Procedure Laterality Date  . ABDOMINAL AORTOGRAM W/LOWER EXTREMITY N/A 06/24/2017   Procedure: ABDOMINAL AORTOGRAM W/LOWER EXTREMITY;  Surgeon: Maeola Harmanain, Brandon Christopher, MD;  [facility];  Service: Cardiovascular;  Laterality: N/A;  . ABDOMINAL AORTOGRAM W/LOWER EXTREMITY Right 10/09/2017   Procedure: ABDOMINAL AORTOGRAM W/LOWER EXTREMITY;  Surgeon: Maeola Harmanain, Brandon Christopher, MD;  [facility];  Service: Cardiovascular;  Laterality: Right;  . AMPUTATION Left 07/23/2017   Procedure: AMPUTATION  BELOW KNEE;  Surgeon: Maeola Harmanain, Brandon Christopher, MD;  [facility];  Service: Vascular;  Laterality: Left;  . AMPUTATION Left 08/12/2017   Procedure: REVISION BELOW KNEE;  Surgeon: Maeola Harmanain, Brandon Christopher, MD;  [facility];  Service: Vascular;  Laterality: Left;  . AMPUTATION Left 08/27/2017   Procedure: left ABOVE KNEE AMPUTATION;  Surgeon: Maeola Harmanain, Brandon Christopher, MD;  [facility];  Service:  Vascular;  Laterality: Left;  . AMPUTATION Left 08/30/2017   Procedure: REVISION AMPUTATION ABOVE KNEE;  Surgeon: Nada LibmanBrabham, Vance W, MD;  [facility];  Service: Vascular;  Laterality: Left;  . AMPUTATION Right 11/04/2017   Procedure: AMPUTATION RIGHT FOURTH TOE;  Surgeon: Maeola Harmanain, Brandon Christopher, MD;  [facility];  Service: Vascular;  Laterality: Right;  . APPLICATION OF WOUND VAC Left 08/27/2017   Procedure: APPLICATION OF WOUND VAC;  Surgeon: Maeola Harmanain, Brandon Christopher, MD;  [facility];  Service: Vascular;  Laterality: Left;  . CARPAL TUNNEL RELEASE Right   . COLONOSCOPY    . LOWER EXTREMITY INTERVENTION Right 10/09/2017   Procedure: LOWER EXTREMITY INTERVENTION;  Surgeon: Maeola Harmanain, Brandon Christopher, MD;  [facility];  Service: Cardiovascular;  Laterality: Right;    Social History   Socioeconomic History  . Marital status: Single    Spouse name: Not on file  . Number of children: Not on file  . Years of education: Not on file  . Highest education level: Not on file  Occupational History  . Not on file  Social Needs  . Financial resource strain: Not on file  . Food insecurity:    Worry: Not on file    Inability: Not on file  . Transportation needs:    Medical: Not on file    Non-medical: Not on file  Tobacco Use  . Smoking status: Former Smoker    Packs/day: 1.00    Years: 51.00    Pack years: 51.00  Types: Cigarettes    Last attempt to quit: 06/08/2017    Years since quitting: 0.5  . Smokeless tobacco: Never Used  Substance and Sexual Activity  . Alcohol use: No    Alcohol/week: 0.0 oz  . Drug use: No  . Sexual activity: Not on file  Lifestyle  . Physical activity:    Days per week: Not on file    Minutes per session: Not on file  . Stress: Not on file  Relationships  . Social connections:    Talks on phone: Not on file    Gets together: Not on file    Attends religious service: Not on file    Active member of club or organization:  Not on file    Attends meetings of clubs or organizations: Not on file    Relationship status: Not on file  . Intimate partner violence:    Fear of current or ex partner: Not on file    Emotionally abused: Not on file    Physically abused: Not on file    Forced sexual activity: Not on file  Other Topics Concern  . Not on file  Social History Narrative   Lives with sister in an apartment on the first floor.  Has no children.    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    Education: 3rd grade.    Current Outpatient Medications on File Prior to Visit  Medication Sig Dispense Refill  . amitriptyline (ELAVIL) 25 MG tablet Take 25 mg by mouth at bedtime.    Marland Kitchen aspirin EC 325 MG tablet Take 325 mg by mouth daily.    . cephALEXin (KEFLEX) 500 MG capsule Take 1 capsule (500 mg total) by mouth 3 (three) times daily. 21 capsule 0  . clopidogrel (PLAVIX) 75 MG tablet Take 1 tablet (75 mg total) by mouth daily. 30 tablet 3  . losartan (COZAAR) 100 MG tablet Take 100 mg by mouth daily.    . Melatonin 10 MG TABS Take 20 mg by mouth at bedtime.    . nicotine polacrilex (NICORELIEF) 2 MG gum Take 2 mg by mouth as needed for smoking cessation.    Marland Kitchen oxyCODONE-acetaminophen (PERCOCET/ROXICET) 5-325 MG tablet Take 1 tablet by mouth every 6 (six) hours as needed for moderate pain. 20 tablet 0  . pravastatin (PRAVACHOL) 20 MG tablet Take 20 mg by mouth daily.     . sertraline (ZOLOFT) 100 MG tablet Take 150 mg by mouth daily.     . traZODone (DESYREL) 50 MG tablet Take 50 mg by mouth at bedtime.    Marland Kitchen VITAMIN E PO Take 180 mg by mouth daily.    . Zinc 50 MG TABS Take 50 mg by mouth daily.     No current facility-administered medications on file prior to visit.     Allergies  Allergen Reactions  . Milk-Related Compounds     incontinence  . Doxycycline Rash    Family History  Problem Relation Age of Onset  . Heart attack Mother   . Alcohol abuse Mother   .  Heart attack Father   . Alcohol abuse Father   . Heart attack Sister   . Heart attack Brother   . Hypercholesterolemia Sister   . Diabetes Neg Hx     BP (!) 148/80 (BP Location: Right Arm, Patient Position: Sitting, Cuff Size: Normal)   Pulse 85   Temp 98.4 F (36.9 C) (Oral)   Ht 5'  3" (1.6 m)   SpO2 93%   BMI 35.43 kg/m    Review of Systems She denies hypoglycemia.     Objective:   Physical Exam VITAL SIGNS:  See vs page GENERAL: no distress Pulses: dorsalis pedis intact bilat.   MSK: no deformity of the feet, but the right 4th toe is surgically absent.  CV: no leg edema.   Skin:  no ulcer on the feet, but there is severe erythema and exfoliation of the distal aspect of both feet.  normal color and temp on the feet. Neuro: sensation is intact to touch on the feet, but severely decreased from normal.   EXT: both great toenails are absent. There is bilateral onychomycosis of the toenails.    Lab Results  Component Value Date   HGBA1C 10.5 01/08/2018      Assessment & Plan:  Insulin-requiring type 2 DM, with PAD: worse Amputations: insulin need decreased when she was in the hospital.  Since then, insulin need is increasing back to baseline.  I explained to pt that this process typically takes months.    Patient Instructions  check your blood sugar twice a day.  vary the time of day when you check, between before the 3 meals, and at bedtime.  also check if you have symptoms of your blood sugar being too high or too low.  please keep a record of the readings and bring it to your next appointment here (or you can bring the meter itself).  You can write it on any piece of paper.  please call us sooner if your blood sugar goes below 70, or if you have a lot of readings over 200.  For now, please increase the insulin to 90 units twice a day. On this type of insulin schedule, you should eat meals on a regular schedule.  If a meal is missed or significantly delayed, your blood  sugar could go low.   Your insulin need is increasing to what it was before the hospitalization.   Please come back for a follow-up appointment in 2 months.  Please call us next week, to tell us how the blood sugar is doing.

## 2018-01-08 NOTE — Patient Instructions (Addendum)
check your blood sugar twice a day.  vary the time of day when you check, between before the 3 meals, and at bedtime.  also check if you have symptoms of your blood sugar being too high or too low.  please keep a record of the readings and bring it to your next appointment here (or you can bring the meter itself).  You can write it on any piece of paper.  please call us sooner if your blood sugar goes below 70, or if you have a lot of readings over 200.  For now, please increase the insulin to 90 units twice a day. On this type of insulin schedule, you should eat meals on a regular schedule.  If a meal is missed or significantly delayed, your blood sugar could go low.   Your insulin need is increasing to what it was before the hospitalization.   Please come back for a follow-up appointment in 2 months.  Please call us next week, to tell us how the blood sugar is doing.

## 2018-01-10 ENCOUNTER — Other Ambulatory Visit: Payer: Self-pay | Admitting: Family Medicine

## 2018-01-10 ENCOUNTER — Ambulatory Visit
Admission: RE | Admit: 2018-01-10 | Discharge: 2018-01-10 | Disposition: A | Discharge status: Home / Self Care | Payer: Medicare HMO | Source: Ambulatory Visit | Attending: Family Medicine | Admitting: Family Medicine

## 2018-01-10 DIAGNOSIS — N644 Mastodynia: Secondary | ICD-10-CM

## 2018-01-10 DIAGNOSIS — R922 Inconclusive mammogram: Secondary | ICD-10-CM | POA: Diagnosis not present

## 2018-01-10 DIAGNOSIS — N6312 Unspecified lump in the right breast, upper inner quadrant: Secondary | ICD-10-CM | POA: Diagnosis not present

## 2018-01-10 DIAGNOSIS — N6310 Unspecified lump in the right breast, unspecified quadrant: Secondary | ICD-10-CM

## 2018-01-16 ENCOUNTER — Telehealth: Payer: Self-pay | Admitting: Endocrinology

## 2018-01-16 DIAGNOSIS — Z794 Long term (current) use of insulin: Secondary | ICD-10-CM | POA: Diagnosis not present

## 2018-01-16 DIAGNOSIS — Z89619 Acquired absence of unspecified leg above knee: Secondary | ICD-10-CM | POA: Diagnosis not present

## 2018-01-16 DIAGNOSIS — I10 Essential (primary) hypertension: Secondary | ICD-10-CM | POA: Diagnosis not present

## 2018-01-16 DIAGNOSIS — Z87891 Personal history of nicotine dependence: Secondary | ICD-10-CM | POA: Diagnosis not present

## 2018-01-16 DIAGNOSIS — Z7982 Long term (current) use of aspirin: Secondary | ICD-10-CM | POA: Diagnosis not present

## 2018-01-16 DIAGNOSIS — E782 Mixed hyperlipidemia: Secondary | ICD-10-CM | POA: Diagnosis not present

## 2018-01-16 DIAGNOSIS — E1151 Type 2 diabetes mellitus with diabetic peripheral angiopathy without gangrene: Secondary | ICD-10-CM | POA: Diagnosis not present

## 2018-01-18 DIAGNOSIS — Z7982 Long term (current) use of aspirin: Secondary | ICD-10-CM | POA: Diagnosis not present

## 2018-01-18 DIAGNOSIS — E782 Mixed hyperlipidemia: Secondary | ICD-10-CM | POA: Diagnosis not present

## 2018-01-18 DIAGNOSIS — Z87891 Personal history of nicotine dependence: Secondary | ICD-10-CM | POA: Diagnosis not present

## 2018-01-18 DIAGNOSIS — Z794 Long term (current) use of insulin: Secondary | ICD-10-CM | POA: Diagnosis not present

## 2018-01-18 DIAGNOSIS — I10 Essential (primary) hypertension: Secondary | ICD-10-CM | POA: Diagnosis not present

## 2018-01-18 DIAGNOSIS — Z89619 Acquired absence of unspecified leg above knee: Secondary | ICD-10-CM | POA: Diagnosis not present

## 2018-01-18 DIAGNOSIS — E1151 Type 2 diabetes mellitus with diabetic peripheral angiopathy without gangrene: Secondary | ICD-10-CM | POA: Diagnosis not present

## 2018-01-21 DIAGNOSIS — Z87891 Personal history of nicotine dependence: Secondary | ICD-10-CM | POA: Diagnosis not present

## 2018-01-21 DIAGNOSIS — E1151 Type 2 diabetes mellitus with diabetic peripheral angiopathy without gangrene: Secondary | ICD-10-CM | POA: Diagnosis not present

## 2018-01-21 DIAGNOSIS — E782 Mixed hyperlipidemia: Secondary | ICD-10-CM | POA: Diagnosis not present

## 2018-01-21 DIAGNOSIS — Z7982 Long term (current) use of aspirin: Secondary | ICD-10-CM | POA: Diagnosis not present

## 2018-01-21 DIAGNOSIS — Z794 Long term (current) use of insulin: Secondary | ICD-10-CM | POA: Diagnosis not present

## 2018-01-21 DIAGNOSIS — Z89619 Acquired absence of unspecified leg above knee: Secondary | ICD-10-CM | POA: Diagnosis not present

## 2018-01-21 DIAGNOSIS — I10 Essential (primary) hypertension: Secondary | ICD-10-CM | POA: Diagnosis not present

## 2018-01-22 DIAGNOSIS — Z89619 Acquired absence of unspecified leg above knee: Secondary | ICD-10-CM | POA: Diagnosis not present

## 2018-01-22 DIAGNOSIS — E1151 Type 2 diabetes mellitus with diabetic peripheral angiopathy without gangrene: Secondary | ICD-10-CM | POA: Diagnosis not present

## 2018-01-22 DIAGNOSIS — I10 Essential (primary) hypertension: Secondary | ICD-10-CM | POA: Diagnosis not present

## 2018-01-22 DIAGNOSIS — Z87891 Personal history of nicotine dependence: Secondary | ICD-10-CM | POA: Diagnosis not present

## 2018-01-22 DIAGNOSIS — Z7982 Long term (current) use of aspirin: Secondary | ICD-10-CM | POA: Diagnosis not present

## 2018-01-22 DIAGNOSIS — E782 Mixed hyperlipidemia: Secondary | ICD-10-CM | POA: Diagnosis not present

## 2018-01-22 DIAGNOSIS — Z794 Long term (current) use of insulin: Secondary | ICD-10-CM | POA: Diagnosis not present

## 2018-01-24 DIAGNOSIS — E1151 Type 2 diabetes mellitus with diabetic peripheral angiopathy without gangrene: Secondary | ICD-10-CM | POA: Diagnosis not present

## 2018-01-24 DIAGNOSIS — Z7982 Long term (current) use of aspirin: Secondary | ICD-10-CM | POA: Diagnosis not present

## 2018-01-24 DIAGNOSIS — I10 Essential (primary) hypertension: Secondary | ICD-10-CM | POA: Diagnosis not present

## 2018-01-24 DIAGNOSIS — Z794 Long term (current) use of insulin: Secondary | ICD-10-CM | POA: Diagnosis not present

## 2018-01-24 DIAGNOSIS — Z89619 Acquired absence of unspecified leg above knee: Secondary | ICD-10-CM | POA: Diagnosis not present

## 2018-01-24 DIAGNOSIS — E782 Mixed hyperlipidemia: Secondary | ICD-10-CM | POA: Diagnosis not present

## 2018-01-24 DIAGNOSIS — Z87891 Personal history of nicotine dependence: Secondary | ICD-10-CM | POA: Diagnosis not present

## 2018-01-28 ENCOUNTER — Encounter (HOSPITAL_COMMUNITY): Payer: Medicare HMO

## 2018-01-28 ENCOUNTER — Ambulatory Visit: Payer: Medicare HMO | Admitting: Family

## 2018-01-28 DIAGNOSIS — Z87891 Personal history of nicotine dependence: Secondary | ICD-10-CM | POA: Diagnosis not present

## 2018-01-28 DIAGNOSIS — E782 Mixed hyperlipidemia: Secondary | ICD-10-CM | POA: Diagnosis not present

## 2018-01-28 DIAGNOSIS — E1151 Type 2 diabetes mellitus with diabetic peripheral angiopathy without gangrene: Secondary | ICD-10-CM | POA: Diagnosis not present

## 2018-01-28 DIAGNOSIS — Z7982 Long term (current) use of aspirin: Secondary | ICD-10-CM | POA: Diagnosis not present

## 2018-01-28 DIAGNOSIS — Z794 Long term (current) use of insulin: Secondary | ICD-10-CM | POA: Diagnosis not present

## 2018-01-28 DIAGNOSIS — I10 Essential (primary) hypertension: Secondary | ICD-10-CM | POA: Diagnosis not present

## 2018-01-28 DIAGNOSIS — Z89619 Acquired absence of unspecified leg above knee: Secondary | ICD-10-CM | POA: Diagnosis not present

## 2018-01-29 DIAGNOSIS — E1151 Type 2 diabetes mellitus with diabetic peripheral angiopathy without gangrene: Secondary | ICD-10-CM | POA: Diagnosis not present

## 2018-01-29 DIAGNOSIS — I10 Essential (primary) hypertension: Secondary | ICD-10-CM | POA: Diagnosis not present

## 2018-01-29 DIAGNOSIS — Z89619 Acquired absence of unspecified leg above knee: Secondary | ICD-10-CM | POA: Diagnosis not present

## 2018-01-29 DIAGNOSIS — Z7982 Long term (current) use of aspirin: Secondary | ICD-10-CM | POA: Diagnosis not present

## 2018-01-29 DIAGNOSIS — E782 Mixed hyperlipidemia: Secondary | ICD-10-CM | POA: Diagnosis not present

## 2018-01-29 DIAGNOSIS — Z87891 Personal history of nicotine dependence: Secondary | ICD-10-CM | POA: Diagnosis not present

## 2018-01-29 DIAGNOSIS — Z794 Long term (current) use of insulin: Secondary | ICD-10-CM | POA: Diagnosis not present

## 2018-01-30 DIAGNOSIS — Z89612 Acquired absence of left leg above knee: Secondary | ICD-10-CM | POA: Diagnosis not present

## 2018-01-30 DIAGNOSIS — Z87891 Personal history of nicotine dependence: Secondary | ICD-10-CM | POA: Diagnosis not present

## 2018-01-30 DIAGNOSIS — E782 Mixed hyperlipidemia: Secondary | ICD-10-CM | POA: Diagnosis not present

## 2018-01-30 DIAGNOSIS — Z89619 Acquired absence of unspecified leg above knee: Secondary | ICD-10-CM | POA: Diagnosis not present

## 2018-01-30 DIAGNOSIS — E1151 Type 2 diabetes mellitus with diabetic peripheral angiopathy without gangrene: Secondary | ICD-10-CM | POA: Diagnosis not present

## 2018-01-30 DIAGNOSIS — I10 Essential (primary) hypertension: Secondary | ICD-10-CM | POA: Diagnosis not present

## 2018-01-30 DIAGNOSIS — Z7982 Long term (current) use of aspirin: Secondary | ICD-10-CM | POA: Diagnosis not present

## 2018-01-30 DIAGNOSIS — Z794 Long term (current) use of insulin: Secondary | ICD-10-CM | POA: Diagnosis not present

## 2018-01-31 DIAGNOSIS — I10 Essential (primary) hypertension: Secondary | ICD-10-CM | POA: Diagnosis not present

## 2018-01-31 DIAGNOSIS — Z87891 Personal history of nicotine dependence: Secondary | ICD-10-CM | POA: Diagnosis not present

## 2018-01-31 DIAGNOSIS — Z794 Long term (current) use of insulin: Secondary | ICD-10-CM | POA: Diagnosis not present

## 2018-01-31 DIAGNOSIS — E782 Mixed hyperlipidemia: Secondary | ICD-10-CM | POA: Diagnosis not present

## 2018-01-31 DIAGNOSIS — Z7982 Long term (current) use of aspirin: Secondary | ICD-10-CM | POA: Diagnosis not present

## 2018-01-31 DIAGNOSIS — Z89619 Acquired absence of unspecified leg above knee: Secondary | ICD-10-CM | POA: Diagnosis not present

## 2018-01-31 DIAGNOSIS — E1151 Type 2 diabetes mellitus with diabetic peripheral angiopathy without gangrene: Secondary | ICD-10-CM | POA: Diagnosis not present

## 2018-02-02 DIAGNOSIS — E1121 Type 2 diabetes mellitus with diabetic nephropathy: Secondary | ICD-10-CM | POA: Diagnosis not present

## 2018-02-02 DIAGNOSIS — Z89619 Acquired absence of unspecified leg above knee: Secondary | ICD-10-CM | POA: Diagnosis not present

## 2018-02-02 DIAGNOSIS — T8189XA Other complications of procedures, not elsewhere classified, initial encounter: Secondary | ICD-10-CM | POA: Diagnosis not present

## 2018-02-02 DIAGNOSIS — S88012A Complete traumatic amputation at knee level, left lower leg, initial encounter: Secondary | ICD-10-CM | POA: Diagnosis not present

## 2018-02-02 DIAGNOSIS — R296 Repeated falls: Secondary | ICD-10-CM | POA: Diagnosis not present

## 2018-02-02 DIAGNOSIS — Z89612 Acquired absence of left leg above knee: Secondary | ICD-10-CM | POA: Diagnosis not present

## 2018-02-03 DIAGNOSIS — Z794 Long term (current) use of insulin: Secondary | ICD-10-CM | POA: Diagnosis not present

## 2018-02-03 DIAGNOSIS — I10 Essential (primary) hypertension: Secondary | ICD-10-CM | POA: Diagnosis not present

## 2018-02-03 DIAGNOSIS — Z87891 Personal history of nicotine dependence: Secondary | ICD-10-CM | POA: Diagnosis not present

## 2018-02-03 DIAGNOSIS — E1151 Type 2 diabetes mellitus with diabetic peripheral angiopathy without gangrene: Secondary | ICD-10-CM | POA: Diagnosis not present

## 2018-02-03 DIAGNOSIS — Z89619 Acquired absence of unspecified leg above knee: Secondary | ICD-10-CM | POA: Diagnosis not present

## 2018-02-03 DIAGNOSIS — E782 Mixed hyperlipidemia: Secondary | ICD-10-CM | POA: Diagnosis not present

## 2018-02-03 DIAGNOSIS — Z7982 Long term (current) use of aspirin: Secondary | ICD-10-CM | POA: Diagnosis not present

## 2018-02-05 DIAGNOSIS — E782 Mixed hyperlipidemia: Secondary | ICD-10-CM | POA: Diagnosis not present

## 2018-02-05 DIAGNOSIS — Z7982 Long term (current) use of aspirin: Secondary | ICD-10-CM | POA: Diagnosis not present

## 2018-02-05 DIAGNOSIS — Z89619 Acquired absence of unspecified leg above knee: Secondary | ICD-10-CM | POA: Diagnosis not present

## 2018-02-05 DIAGNOSIS — Z87891 Personal history of nicotine dependence: Secondary | ICD-10-CM | POA: Diagnosis not present

## 2018-02-05 DIAGNOSIS — Z794 Long term (current) use of insulin: Secondary | ICD-10-CM | POA: Diagnosis not present

## 2018-02-05 DIAGNOSIS — I10 Essential (primary) hypertension: Secondary | ICD-10-CM | POA: Diagnosis not present

## 2018-02-05 DIAGNOSIS — E1151 Type 2 diabetes mellitus with diabetic peripheral angiopathy without gangrene: Secondary | ICD-10-CM | POA: Diagnosis not present

## 2018-02-07 DIAGNOSIS — Z794 Long term (current) use of insulin: Secondary | ICD-10-CM | POA: Diagnosis not present

## 2018-02-07 DIAGNOSIS — Z7982 Long term (current) use of aspirin: Secondary | ICD-10-CM | POA: Diagnosis not present

## 2018-02-07 DIAGNOSIS — Z89619 Acquired absence of unspecified leg above knee: Secondary | ICD-10-CM | POA: Diagnosis not present

## 2018-02-07 DIAGNOSIS — I10 Essential (primary) hypertension: Secondary | ICD-10-CM | POA: Diagnosis not present

## 2018-02-07 DIAGNOSIS — Z87891 Personal history of nicotine dependence: Secondary | ICD-10-CM | POA: Diagnosis not present

## 2018-02-07 DIAGNOSIS — E1151 Type 2 diabetes mellitus with diabetic peripheral angiopathy without gangrene: Secondary | ICD-10-CM | POA: Diagnosis not present

## 2018-02-07 DIAGNOSIS — E782 Mixed hyperlipidemia: Secondary | ICD-10-CM | POA: Diagnosis not present

## 2018-02-10 DIAGNOSIS — Z7982 Long term (current) use of aspirin: Secondary | ICD-10-CM | POA: Diagnosis not present

## 2018-02-10 DIAGNOSIS — E1151 Type 2 diabetes mellitus with diabetic peripheral angiopathy without gangrene: Secondary | ICD-10-CM | POA: Diagnosis not present

## 2018-02-10 DIAGNOSIS — Z87891 Personal history of nicotine dependence: Secondary | ICD-10-CM | POA: Diagnosis not present

## 2018-02-10 DIAGNOSIS — Z89619 Acquired absence of unspecified leg above knee: Secondary | ICD-10-CM | POA: Diagnosis not present

## 2018-02-10 DIAGNOSIS — I10 Essential (primary) hypertension: Secondary | ICD-10-CM | POA: Diagnosis not present

## 2018-02-10 DIAGNOSIS — E782 Mixed hyperlipidemia: Secondary | ICD-10-CM | POA: Diagnosis not present

## 2018-02-10 DIAGNOSIS — Z794 Long term (current) use of insulin: Secondary | ICD-10-CM | POA: Diagnosis not present

## 2018-02-12 DIAGNOSIS — E782 Mixed hyperlipidemia: Secondary | ICD-10-CM | POA: Diagnosis not present

## 2018-02-12 DIAGNOSIS — E1151 Type 2 diabetes mellitus with diabetic peripheral angiopathy without gangrene: Secondary | ICD-10-CM | POA: Diagnosis not present

## 2018-02-12 DIAGNOSIS — Z89619 Acquired absence of unspecified leg above knee: Secondary | ICD-10-CM | POA: Diagnosis not present

## 2018-02-12 DIAGNOSIS — Z87891 Personal history of nicotine dependence: Secondary | ICD-10-CM | POA: Diagnosis not present

## 2018-02-12 DIAGNOSIS — Z7982 Long term (current) use of aspirin: Secondary | ICD-10-CM | POA: Diagnosis not present

## 2018-02-12 DIAGNOSIS — Z794 Long term (current) use of insulin: Secondary | ICD-10-CM | POA: Diagnosis not present

## 2018-02-12 DIAGNOSIS — I10 Essential (primary) hypertension: Secondary | ICD-10-CM | POA: Diagnosis not present

## 2018-02-13 DIAGNOSIS — Z89619 Acquired absence of unspecified leg above knee: Secondary | ICD-10-CM | POA: Diagnosis not present

## 2018-02-13 DIAGNOSIS — E1151 Type 2 diabetes mellitus with diabetic peripheral angiopathy without gangrene: Secondary | ICD-10-CM | POA: Diagnosis not present

## 2018-02-13 DIAGNOSIS — Z7982 Long term (current) use of aspirin: Secondary | ICD-10-CM | POA: Diagnosis not present

## 2018-02-13 DIAGNOSIS — Z794 Long term (current) use of insulin: Secondary | ICD-10-CM | POA: Diagnosis not present

## 2018-02-13 DIAGNOSIS — I10 Essential (primary) hypertension: Secondary | ICD-10-CM | POA: Diagnosis not present

## 2018-02-13 DIAGNOSIS — E782 Mixed hyperlipidemia: Secondary | ICD-10-CM | POA: Diagnosis not present

## 2018-02-13 DIAGNOSIS — Z87891 Personal history of nicotine dependence: Secondary | ICD-10-CM | POA: Diagnosis not present

## 2018-02-14 DIAGNOSIS — R350 Frequency of micturition: Secondary | ICD-10-CM | POA: Diagnosis not present

## 2018-02-14 DIAGNOSIS — E11621 Type 2 diabetes mellitus with foot ulcer: Secondary | ICD-10-CM | POA: Diagnosis not present

## 2018-02-14 DIAGNOSIS — L97511 Non-pressure chronic ulcer of other part of right foot limited to breakdown of skin: Secondary | ICD-10-CM | POA: Diagnosis not present

## 2018-02-17 DIAGNOSIS — Z89619 Acquired absence of unspecified leg above knee: Secondary | ICD-10-CM | POA: Diagnosis not present

## 2018-02-17 DIAGNOSIS — I10 Essential (primary) hypertension: Secondary | ICD-10-CM | POA: Diagnosis not present

## 2018-02-17 DIAGNOSIS — Z87891 Personal history of nicotine dependence: Secondary | ICD-10-CM | POA: Diagnosis not present

## 2018-02-17 DIAGNOSIS — E1151 Type 2 diabetes mellitus with diabetic peripheral angiopathy without gangrene: Secondary | ICD-10-CM | POA: Diagnosis not present

## 2018-02-17 DIAGNOSIS — Z794 Long term (current) use of insulin: Secondary | ICD-10-CM | POA: Diagnosis not present

## 2018-02-17 DIAGNOSIS — E782 Mixed hyperlipidemia: Secondary | ICD-10-CM | POA: Diagnosis not present

## 2018-02-17 DIAGNOSIS — Z7982 Long term (current) use of aspirin: Secondary | ICD-10-CM | POA: Diagnosis not present

## 2018-02-19 DIAGNOSIS — E1151 Type 2 diabetes mellitus with diabetic peripheral angiopathy without gangrene: Secondary | ICD-10-CM | POA: Diagnosis not present

## 2018-02-19 DIAGNOSIS — Z7982 Long term (current) use of aspirin: Secondary | ICD-10-CM | POA: Diagnosis not present

## 2018-02-19 DIAGNOSIS — Z794 Long term (current) use of insulin: Secondary | ICD-10-CM | POA: Diagnosis not present

## 2018-02-19 DIAGNOSIS — E782 Mixed hyperlipidemia: Secondary | ICD-10-CM | POA: Diagnosis not present

## 2018-02-19 DIAGNOSIS — Z87891 Personal history of nicotine dependence: Secondary | ICD-10-CM | POA: Diagnosis not present

## 2018-02-19 DIAGNOSIS — Z89619 Acquired absence of unspecified leg above knee: Secondary | ICD-10-CM | POA: Diagnosis not present

## 2018-02-19 DIAGNOSIS — I10 Essential (primary) hypertension: Secondary | ICD-10-CM | POA: Diagnosis not present

## 2018-02-20 DIAGNOSIS — G5621 Lesion of ulnar nerve, right upper limb: Secondary | ICD-10-CM | POA: Diagnosis not present

## 2018-02-20 DIAGNOSIS — M79644 Pain in right finger(s): Secondary | ICD-10-CM

## 2018-02-20 HISTORY — DX: Pain in right finger(s): M79.644

## 2018-02-21 DIAGNOSIS — Z794 Long term (current) use of insulin: Secondary | ICD-10-CM | POA: Diagnosis not present

## 2018-02-21 DIAGNOSIS — Z89619 Acquired absence of unspecified leg above knee: Secondary | ICD-10-CM | POA: Diagnosis not present

## 2018-02-21 DIAGNOSIS — Z87891 Personal history of nicotine dependence: Secondary | ICD-10-CM | POA: Diagnosis not present

## 2018-02-21 DIAGNOSIS — Z7982 Long term (current) use of aspirin: Secondary | ICD-10-CM | POA: Diagnosis not present

## 2018-02-21 DIAGNOSIS — E1151 Type 2 diabetes mellitus with diabetic peripheral angiopathy without gangrene: Secondary | ICD-10-CM | POA: Diagnosis not present

## 2018-02-21 DIAGNOSIS — E782 Mixed hyperlipidemia: Secondary | ICD-10-CM | POA: Diagnosis not present

## 2018-02-21 DIAGNOSIS — I10 Essential (primary) hypertension: Secondary | ICD-10-CM | POA: Diagnosis not present

## 2018-02-28 DIAGNOSIS — Z87891 Personal history of nicotine dependence: Secondary | ICD-10-CM | POA: Diagnosis not present

## 2018-02-28 DIAGNOSIS — I10 Essential (primary) hypertension: Secondary | ICD-10-CM | POA: Diagnosis not present

## 2018-02-28 DIAGNOSIS — Z7982 Long term (current) use of aspirin: Secondary | ICD-10-CM | POA: Diagnosis not present

## 2018-02-28 DIAGNOSIS — Z89619 Acquired absence of unspecified leg above knee: Secondary | ICD-10-CM | POA: Diagnosis not present

## 2018-02-28 DIAGNOSIS — E1151 Type 2 diabetes mellitus with diabetic peripheral angiopathy without gangrene: Secondary | ICD-10-CM | POA: Diagnosis not present

## 2018-02-28 DIAGNOSIS — Z794 Long term (current) use of insulin: Secondary | ICD-10-CM | POA: Diagnosis not present

## 2018-02-28 DIAGNOSIS — E782 Mixed hyperlipidemia: Secondary | ICD-10-CM | POA: Diagnosis not present

## 2018-03-04 DIAGNOSIS — Z89612 Acquired absence of left leg above knee: Secondary | ICD-10-CM | POA: Diagnosis not present

## 2018-03-04 DIAGNOSIS — T8189XA Other complications of procedures, not elsewhere classified, initial encounter: Secondary | ICD-10-CM | POA: Diagnosis not present

## 2018-03-04 DIAGNOSIS — E1121 Type 2 diabetes mellitus with diabetic nephropathy: Secondary | ICD-10-CM | POA: Diagnosis not present

## 2018-03-04 DIAGNOSIS — R296 Repeated falls: Secondary | ICD-10-CM | POA: Diagnosis not present

## 2018-03-04 DIAGNOSIS — S88012A Complete traumatic amputation at knee level, left lower leg, initial encounter: Secondary | ICD-10-CM | POA: Diagnosis not present

## 2018-03-04 DIAGNOSIS — Z89619 Acquired absence of unspecified leg above knee: Secondary | ICD-10-CM | POA: Diagnosis not present

## 2018-03-05 ENCOUNTER — Ambulatory Visit: Payer: Medicare HMO | Attending: Family Medicine | Admitting: Physical Therapy

## 2018-03-05 ENCOUNTER — Other Ambulatory Visit: Payer: Self-pay

## 2018-03-05 DIAGNOSIS — R293 Abnormal posture: Secondary | ICD-10-CM | POA: Insufficient documentation

## 2018-03-05 DIAGNOSIS — M25652 Stiffness of left hip, not elsewhere classified: Secondary | ICD-10-CM

## 2018-03-05 DIAGNOSIS — R2681 Unsteadiness on feet: Secondary | ICD-10-CM | POA: Diagnosis not present

## 2018-03-05 DIAGNOSIS — M6281 Muscle weakness (generalized): Secondary | ICD-10-CM | POA: Diagnosis not present

## 2018-03-05 DIAGNOSIS — R2689 Other abnormalities of gait and mobility: Secondary | ICD-10-CM | POA: Diagnosis not present

## 2018-03-05 NOTE — Therapy (Signed)
[facility] [address] [place], [place], [address] Phone: [phone]   Fax:  [phone]  Physical Therapy Evaluation  Patient Details  Name: Danielle Buchanan MRN: [id number] Date of Birth: [birth year] Referring Provider: Maurice Small, MD   Encounter Date: 03/05/2018  PT End of Session - 03/05/18 2247    Visit Number  1    Number of Visits  29    Date for PT Re-Evaluation  06/03/18    Authorization Type  [employer]    Authorization Time Period  $3400 OOP max has been met prior to PT evaluation. Visit limit Medicare guidelines    PT Start Time  1100    PT Stop Time  1155    PT Time Calculation (min)  55 min       Past Medical History:  Diagnosis Date  . Allergic rhinitis   . Depression   . Diabetes ([place])   . Dyslipidemia   . HTN (hypertension)   . Insomnia   . OSA (obstructive sleep apnea)    no cpap  . Urinary incontinence     Past Surgical History:  Procedure Laterality Date  . ABDOMINAL AORTOGRAM W/LOWER EXTREMITY N/A 06/24/2017   Procedure: ABDOMINAL AORTOGRAM W/LOWER EXTREMITY;  Surgeon: Waynetta Sandy, MD;  Location: [facility];  Service: Cardiovascular;  Laterality: N/A;  . ABDOMINAL AORTOGRAM W/LOWER EXTREMITY Right 10/09/2017   Procedure: ABDOMINAL AORTOGRAM W/LOWER EXTREMITY;  Surgeon: Waynetta Sandy, MD;  Location: [facility];  Service: Cardiovascular;  Laterality: Right;  . AMPUTATION Left 07/23/2017   Procedure: AMPUTATION  BELOW KNEE;  Surgeon: Waynetta Sandy, MD;  Location: [facility];  Service: Vascular;  Laterality: Left;  . AMPUTATION Left 08/12/2017   Procedure: REVISION BELOW KNEE;  Surgeon: Waynetta Sandy, MD;  Location: [facility];  Service: Vascular;  Laterality: Left;  . AMPUTATION Left 08/27/2017   Procedure: left ABOVE KNEE AMPUTATION;  Surgeon: Waynetta Sandy, MD;  Location: [facility];  Service: Vascular;  Laterality: Left;  .  AMPUTATION Left 08/30/2017   Procedure: REVISION AMPUTATION ABOVE KNEE;  Surgeon: Serafina Mitchell, MD;  Location: [facility];  Service: Vascular;  Laterality: Left;  . AMPUTATION Right 11/04/2017   Procedure: AMPUTATION RIGHT FOURTH TOE;  Surgeon: Waynetta Sandy, MD;  Location: [facility];  Service: Vascular;  Laterality: Right;  . APPLICATION OF WOUND VAC Left 08/27/2017   Procedure: APPLICATION OF WOUND VAC;  Surgeon: Waynetta Sandy, MD;  Location: [facility];  Service: Vascular;  Laterality: Left;  . CARPAL TUNNEL RELEASE Right   . COLONOSCOPY    . LOWER EXTREMITY INTERVENTION Right 10/09/2017   Procedure: LOWER EXTREMITY INTERVENTION;  Surgeon: Waynetta Sandy, MD;  Location: [facility];  Service: Cardiovascular;  Laterality: Right;    There were no vitals filed for this visit.   Subjective Assessment - 03/05/18 1106    Subjective  This 62yo female was referred by Maurice Small, MD on 02/28/2018. She underwent a left Transtibial Amputation on 07/23/2017 which was revised to a Transfemoral Amputation on 08/27/2017. She also underwent a right fouth toe amputation on 11/04/2017. She recieved prosthesis on 01/10/2018. HHPT until 02/24/2018.    Patient is accompained by:  Family member sister, [name]    Pertinent History  left TFA, right 4th toe amputation, DM2, PAD, HTN, right carpal tunnel release,    Limitations  Lifting;Standing;Walking;House hold activities    Patient Stated Goals  Walk with prosthesis in  home & in community    Currently in Pain?  No/denies         [facility] PT Assessment - 03/05/18 1100      Assessment   Medical Diagnosis  Left Transfemoral Amputation    Referring Provider  Maurice Small, MD    Onset Date/Surgical Date  02/24/18 HHPT discharge    Hand Dominance  Right    Prior Therapy  [facility]      Precautions   Precautions  Fall      Balance Screen   Has the patient fallen in the past 6 months  No    Has the patient had a decrease in  activity level because of a fear of falling?   Yes    Is the patient reluctant to leave their home because of a fear of falling?   Yes      [place]  Private residence    Living Arrangements  Other relatives sister, dog ~15#    Type of [place] Access  Level entry curb cut at [place]  One level handicap accessible apt    [place] - power;Walker - 2 wheels;Walker - 4 wheels;Cane - single point;Crutches;Tub bench;Hand held shower head;Grab bars - tub/shower;Grab bars - toilet      Prior Function   Level of Independence  Independent;Independent with household mobility without device;Independent with community mobility without device    Vocation  Retired    Leisure  watching tv, shopping, walking      Posture/Postural Control   Posture/Postural Control  Postural limitations    Postural Limitations  Rounded Shoulders;Forward head;Increased lumbar lordosis;Flexed trunk;Weight shift right      ROM / Strength   AROM / PROM / Strength  PROM;Strength      PROM   Overall PROM   Deficits    Overall PROM Comments  left hip extension standing -25*      Strength   Overall Strength  Deficits    Overall Strength Comments  right knee extension & ankle dorsiflexion 5/5    Strength Assessment Site  Hip    Right/Left Hip  Right;Left    Right Hip Flexion  3-/5    Right Hip Extension  3-/5    Right Hip ABduction  3-/5    Left Hip Flexion  3-/5    Left Hip Extension  3-/5    Left Hip ABduction  3-/5      Transfers   Transfers  Sit to Stand;Stand to Sit    Sit to Stand  4: Min assist;With upper extremity assist;With armrests;From chair/3-in-1 to RW for stability    Sit to Stand Details (indicate cue type and reason)  verbal & manual cues for locking prosthetic knee    Stand to Sit  4: Min assist;With upper extremity assist;With armrests;To chair/3-in-1 from RW for stability, locked TFA prosthesis       Ambulation/Gait   Ambulation/Gait  Yes    Ambulation/Gait Assistance  3: Mod assist    Ambulation/Gait Assistance Details  excessive UE weight bearing on RW    Ambulation Distance (Feet)  8 Feet    Assistive device  Rolling walker;Prosthesis locked TFA prosthesis    Gait Pattern  Step-to pattern;Decreased step length - right;Decreased stance time - left;Decreased stride length;Decreased weight shift to left;Left circumduction;Left hip hike;Antalgic;Lateral hip instability;Trunk flexed;Abducted - left;Poor foot clearance - left;Poor foot clearance -  right    Ambulation Surface  Indoor;Level      Balance   Balance Assessed  Yes      Static Standing Balance   Static Standing - Balance Support  Bilateral upper extremity supported    Static Standing - Level of Assistance  5: Stand by assistance    Static Standing Balance -  Activities   Romberg - Eyes Opened    Static Standing - Comment/# of Minutes  Maintains upright for 1 minute with supervision with RW support & locked TFA prosthesis positioned under pelvis with PT assist.      Dynamic Standing Balance   Dynamic Standing - Balance Support  Bilateral upper extremity supported;Left upper extremity supported standing with RW support, scanning BUE & reaching with RUE     Dynamic Standing - Level of Assistance  4: Min assist    Dynamic Standing - Balance Activities  Head nods;Head turns;Reaching for objects    Dynamic Standing - Comments  BUE support on RW scans with cervical range looking to side only & up/down,  Reaching with dominant RUE with LUE support on RW: 2" anterior to RW (within length of arm)      Prosthetics Assessment - 03/05/18 1100      Prosthetics   Prosthetic Care Dependent with  Skin check;Residual limb care;Care of non-amputated limb;Prosthetic cleaning;Ply sock cleaning;Correct ply sock adjustment;Proper wear schedule/adjustment;Proper weight-bearing schedule/adjustment    Donning prosthesis   Max assist    Doffing  prosthesis   Min assist    Current prosthetic wear tolerance (days/week)   prosthesis delivered 40 days ago; worn only when HHPT present & 2 other times for <30 minutes sitting.    Current prosthetic wear tolerance (#hours/day)   30 minutes    Current prosthetic weight-bearing tolerance (hours/day)   Pt tolerated standing for 3 mintues without c/o limb pain or back pain.     Edema  pitting edema with 3 second refill    Residual limb condition   no open areas, dry skin, scar invaginated with adherance on medial 1/3, normal color & temperature, no hair growth,     K code/activity level with prosthetic use   K2 limited community with fixed cadence               Objective measurements completed on examination: See above findings.      [facility] PT Treatment/Exercise - 03/05/18 1100      Prosthetics   Prosthetic Care Comments   PT instructed to wear prosthesis 2hrs 2x/day with recommendation to donne after toileting. PT recommended sitting only, not to transfer or gait currently. Remove prosthesis prior to toileting or transfering.  Sitting positioning with pillow or folded towel under right pelvis /thigh to level pelvis when wearing prosthesis.     Education Provided  Skin check;Residual limb care;Prosthetic cleaning;Proper Donning;Proper Doffing;Proper wear schedule/adjustment;Other (comment) see prosthetic care comments    Person(s) Educated  Patient;Other (comment) sister    Education Method  Explanation;Demonstration;Tactile cues;Verbal cues    Education Method  Verbalized understanding;Returned demonstration;Tactile cues required;Verbal cues required;Needs further instruction               PT Short Term Goals - 03/05/18 2316      PT SHORT TERM GOAL #1   Title  Patient donnes prosthesis including tightening suspension strap with supervision. (All STGs Target Date 04/04/2018)    Time  4    Period  Weeks    Status  New    Target Date  04/04/18      PT SHORT TERM GOAL  #2   Title  Patient tolerates daily prosthesis wear >6hrs total / day.     Time  4    Period  Weeks    Status  New    Target Date  04/04/18      PT SHORT TERM GOAL #3   Title  Patient sit to/from stand from chairs with armrests to rolling walker with prosthesis with supervision.     Time  4    Period  Weeks    Status  New    Target Date  04/04/18      PT SHORT TERM GOAL #4   Title  Patient ambulates 30' with rolling walker & locked prosthesis with minA.     Time  4    Period  Weeks    Status  New    Target Date  04/04/18        PT Long Term Goals - 03/05/18 2308      PT LONG TERM GOAL #1   Title  Patient & sister verbalize & demonstrate understanding of proper prosthetic care to enable safe use of prosthesis.  (All LTG Target Dates:  05/30/2018)    Time  12    Period  Weeks    Status  New    Target Date  05/30/18      PT LONG TERM GOAL #2   Title  Patient tolerates wear of Transfemoral prosthesis >75% of awake hours without skin issues or limb pain.     Time  12    Period  Weeks    Status  New    Target Date  05/30/18      PT LONG TERM GOAL #3   Title  Standing balance with RW support: reaching 10" anteriorly, picks up object from floor, scans environment & manages clothes for toileting modified indepenent.     Time  12    Period  Weeks    Status  New    Target Date  05/30/18      PT LONG TERM GOAL #4   Title  Patient ambulates 200' with RW & prosthesis with supervision for limited community mobility.     Time  12    Period  Weeks    Status  New    Target Date  05/30/18      PT LONG TERM GOAL #5   Title  Patient ambulates 84' around furniture with RW & prosthesis modified independent for household mobility.     Time  12    Period  Weeks    Status  New    Target Date  05/30/18      Additional Long Term Goals   Additional Long Term Goals  Yes      PT LONG TERM GOAL #6   Title  Patient negotiates stairs with 2 rails, ramps & curbs with RW & prosthesis with  minimal assist for community access.     Time  12    Period  Weeks    Status  New    Target Date  05/30/18             Plan - 03/05/18 2252    Clinical Impression Statement  This 62yo female recieved prosthesis 01/10/2018 (40 days prior to evaluation) and only worn with HHPT (discharged 02/24/2018) & 2 other times for <30 minutes. WIthout prosthesis patient is w/c bound. Her sister & she are dependent in prosthetic care which  increases risk of issues including skin integrity or pain.  Standing balance with walker support requires MinA to reach 2" anterior to walker (arm not fully extended).  Prosthetic gait with rolling walker requires moderate assist with significant gait devations and locked Transfermoral prosthesis. Patient would benefit from skilled PT to improve function & safety with transfemoral prosthesis.     History and Personal Factors relevant to plan of care:  left TFA, right 4th toe amputation, DM2, PAD, HTN, right carpal tunnel release,    Clinical Presentation  Evolving    Clinical Presentation due to:  prosthetic dependency, high fall risk, medical issues    Clinical Decision Making  Moderate    Rehab Potential  Good    Clinical Impairments Affecting Rehab Potential  pt unable to read / write and sister who is caregiver reports limited ability to read / write    PT Frequency  3x / week 3x/wk for 4 weeks then 2x/wk for 8 weeks    PT Duration  12 weeks    PT Treatment/Interventions  ADLs/Self Care Home Management;DME Instruction;Gait training;Stair training;Functional mobility training;Therapeutic activities;Therapeutic exercise;Balance training;Neuromuscular re-education;Patient/family education;Prosthetic Training;Passive range of motion;Vestibular;Canalith Repostioning    PT Next Visit Plan  review prosthetic care, work on sit to/from stand, HEP at sink (open cabinet to enable sit down with locked prosthesis)    Consulted and Agree with Plan of Care  Patient;Family  member/caregiver    Family Member Consulted  sister, [name]       Patient will benefit from skilled therapeutic intervention in order to improve the following deficits and impairments:  Abnormal gait, Decreased activity tolerance, Decreased balance, Decreased coordination, Decreased endurance, Decreased knowledge of use of DME, Decreased mobility, Decreased range of motion, Decreased scar mobility, Decreased strength, Impaired flexibility, Postural dysfunction, Obesity, Prosthetic Dependency  Visit Diagnosis: Stiffness of left hip, not elsewhere classified  Muscle weakness (generalized)  Unsteadiness on feet  Other abnormalities of gait and mobility  Abnormal posture     Problem List Patient Active Problem List   Diagnosis Date Noted  . Atherosclerosis of native arteries of the extremities with gangrene ([place]) 11/01/2017  . Unilateral AKA, left ([place])   . Post-operative pain   . OSA (obstructive sleep apnea)   . Benign essential HTN   . Diabetes mellitus type 2 in obese ([place])   . Hyperkalemia   . Leukocytosis   . Acute blood loss anemia   . Amputation stump infection ([place]) 08/23/2017  . Amputation of left lower extremity above knee upon examination ([place]) 08/23/2017  . PAD (peripheral artery disease) ([place]) 07/23/2017  . Cellulitis 06/10/2017  . Hyponatremia 06/10/2017  . Fever 06/10/2017  . Rash 05/24/2017  . Diabetes ([place])   . HTN (hypertension)   . Allergic rhinitis   . Insomnia   . Depression     Osric Klopf PT, DPT 03/05/2018, 11:21 PM  [facility] [address] [place], [place], [address] Phone: [phone]   Fax:  [phone]  Name: Danielle Buchanan MRN: [id number] Date of Birth: [birth year]

## 2018-03-10 ENCOUNTER — Encounter: Payer: Medicare HMO | Admitting: Physical Therapy

## 2018-03-10 DIAGNOSIS — G5621 Lesion of ulnar nerve, right upper limb: Secondary | ICD-10-CM | POA: Diagnosis not present

## 2018-03-10 DIAGNOSIS — E1140 Type 2 diabetes mellitus with diabetic neuropathy, unspecified: Secondary | ICD-10-CM | POA: Diagnosis not present

## 2018-03-11 ENCOUNTER — Ambulatory Visit: Payer: Medicare HMO | Admitting: Endocrinology

## 2018-03-11 ENCOUNTER — Encounter: Payer: Self-pay | Admitting: Endocrinology

## 2018-03-11 VITALS — BP 130/76 | HR 89

## 2018-03-11 DIAGNOSIS — E1142 Type 2 diabetes mellitus with diabetic polyneuropathy: Secondary | ICD-10-CM | POA: Diagnosis not present

## 2018-03-11 DIAGNOSIS — Z794 Long term (current) use of insulin: Secondary | ICD-10-CM

## 2018-03-11 LAB — POCT GLYCOSYLATED HEMOGLOBIN (HGB A1C): Hemoglobin A1C: 10.7 % — AB (ref 4.0–5.6)

## 2018-03-11 MED ORDER — INSULIN NPH ISOPHANE & REGULAR (70-30) 100 UNIT/ML ~~LOC~~ SUSP
SUBCUTANEOUS | 3 refills | Status: DC
Start: 2018-03-11 — End: 2018-03-11

## 2018-03-11 MED ORDER — INSULIN NPH ISOPHANE & REGULAR (70-30) 100 UNIT/ML ~~LOC~~ SUSP: SUBCUTANEOUS | 3 refills | Status: DC

## 2018-03-11 NOTE — Progress Notes (Signed)
Subjective:    Patient ID: Danielle Buchanan, female    DOB: 01-04-1956, 62 y.o.   MRN: [id number]  HPI Pt returns for f/u of diabetes mellitus: DM type: Insulin-requiring type 2 Dx'ed: 1992 Complications: polyneuropathy, left leg BKA, and PAD.   Therapy: insulin since 1997 GDM: never DKA: never Severe hypoglycemia: never.  Pancreatitis: never Other: she says she cannot afford insulin analogs; she declines multiple daily injections.   Interval history: she brings a record of her cbg's which I have reviewed today.  She checks fasting only.  It varies from 165-200's.  pt states she feels well in general.   Past Medical History:  Diagnosis Date  . Allergic rhinitis   . Depression   . Diabetes ([facility])   . Dyslipidemia   . HTN (hypertension)   . Insomnia   . OSA (obstructive sleep apnea)    no cpap  . Urinary incontinence     Past Surgical History:  Procedure Laterality Date  . ABDOMINAL AORTOGRAM W/LOWER EXTREMITY N/A 06/24/2017   Procedure: ABDOMINAL AORTOGRAM W/LOWER EXTREMITY;  Surgeon: Maeola Harmanain, Brandon Christopher, MD;  [facility];  Service: Cardiovascular;  Laterality: N/A;  . ABDOMINAL AORTOGRAM W/LOWER EXTREMITY Right 10/09/2017   Procedure: ABDOMINAL AORTOGRAM W/LOWER EXTREMITY;  Surgeon: Maeola Harmanain, Brandon Christopher, MD;  [facility];  Service: Cardiovascular;  Laterality: Right;  . AMPUTATION Left 07/23/2017   Procedure: AMPUTATION  BELOW KNEE;  Surgeon: Maeola Harmanain, Brandon Christopher, MD;  [facility];  Service: Vascular;  Laterality: Left;  . AMPUTATION Left 08/12/2017   Procedure: REVISION BELOW KNEE;  Surgeon: Maeola Harmanain, Brandon Christopher, MD;  [facility];  Service: Vascular;  Laterality: Left;  . AMPUTATION Left 08/27/2017   Procedure: left ABOVE KNEE AMPUTATION;  Surgeon: Maeola Harmanain, Brandon Christopher, MD;  [facility];  Service: Vascular;  Laterality: Left;  . AMPUTATION Left 08/30/2017   Procedure: REVISION AMPUTATION ABOVE KNEE;  Surgeon:  Nada LibmanBrabham, Vance W, MD;  [facility];  Service: Vascular;  Laterality: Left;  . AMPUTATION Right 11/04/2017   Procedure: AMPUTATION RIGHT FOURTH TOE;  Surgeon: Maeola Harmanain, Brandon Christopher, MD;  [facility];  Service: Vascular;  Laterality: Right;  . APPLICATION OF WOUND VAC Left 08/27/2017   Procedure: APPLICATION OF WOUND VAC;  Surgeon: Maeola Harmanain, Brandon Christopher, MD;  [facility];  Service: Vascular;  Laterality: Left;  . CARPAL TUNNEL RELEASE Right   . COLONOSCOPY    . LOWER EXTREMITY INTERVENTION Right 10/09/2017   Procedure: LOWER EXTREMITY INTERVENTION;  Surgeon: Maeola Harmanain, Brandon Christopher, MD;  [facility];  Service: Cardiovascular;  Laterality: Right;    Social History   Socioeconomic History  . Marital status: Single    Spouse name: Not on file  . Number of children: Not on file  . Years of education: Not on file  . Highest education level: Not on file  Occupational History  . Not on file  Social Needs  . Financial resource strain: Not on file  . Food insecurity:    Worry: Not on file    Inability: Not on file  . Transportation needs:    Medical: Not on file    Non-medical: Not on file  Tobacco Use  . Smoking status: Former Smoker    Packs/day: 1.00    Years: 51.00    Pack years: 51.00    Types: Cigarettes    Last attempt to quit: 06/08/2017    Years since quitting: 0.7  . Smokeless tobacco: Never Used  Substance and Sexual Activity  . Alcohol use: No    Alcohol/week: 0.0 oz  . Drug use: No  . Sexual activity: Not on file  Lifestyle  . Physical activity:    Days per week: Not on file    Minutes per session: Not on file  . Stress: Not on file  Relationships  . Social connections:    Talks on phone: Not on file    Gets together: Not on file    Attends religious service: Not on file    Active member of club or organization: Not on file    Attends meetings of clubs or organizations: Not on file    Relationship status: Not on file  .  Intimate partner violence:    Fear of current or ex partner: Not on file    Emotionally abused: Not on file    Physically abused: Not on file    Forced sexual activity: Not on file  Other Topics Concern  . Not on file  Social History Narrative   Lives with sister in an apartment on the first floor.  Has no children.    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    Education: 3rd grade.    Current Outpatient Medications on File Prior to Visit  Medication Sig Dispense Refill  . amitriptyline (ELAVIL) 25 MG tablet Take 25 mg by mouth at bedtime.    Marland Kitchen aspirin EC 325 MG tablet Take 325 mg by mouth daily.    . clopidogrel (PLAVIX) 75 MG tablet Take 1 tablet (75 mg total) by mouth daily. 30 tablet 3  . losartan (COZAAR) 100 MG tablet Take 100 mg by mouth daily.    . Melatonin 10 MG TABS Take 20 mg by mouth at bedtime.    . nicotine polacrilex (NICORELIEF) 2 MG gum Take 2 mg by mouth as needed for smoking cessation.    . pravastatin (PRAVACHOL) 20 MG tablet Take 20 mg by mouth daily.     . sertraline (ZOLOFT) 100 MG tablet Take 150 mg by mouth daily.     . traZODone (DESYREL) 50 MG tablet Take 50 mg by mouth at bedtime.    Marland Kitchen VITAMIN E PO Take 180 mg by mouth daily.    . Zinc 50 MG TABS Take 50 mg by mouth daily.     No current facility-administered medications on file prior to visit.     Allergies  Allergen Reactions  . Milk-Related Compounds     incontinence  . Doxycycline Rash    Family History  Problem Relation Age of Onset  . Heart attack Mother   . Alcohol abuse Mother   . Heart attack Father   . Alcohol abuse Father   . Heart attack Sister   . Heart attack Brother   . Hypercholesterolemia Sister   . Diabetes Neg Hx     BP 130/76   Pulse 89   SpO2 94%   Review of Systems She denies hypoglycemia    Objective:   Physical Exam VITAL SIGNS:  See vs page GENERAL: no distress.  In wheelchair Pulses: right dorsalis pedis is  intact. MSK: the right 4th toe is surgically absent.  CV: no right leg edema.   Skin:  no ulcer on the right foot.  normal color and temp on the right foot Neuro: sensation is intact to touch on the right foot, but severely decreased from normal.   EXT: right great toenail is  absent. There is onychomycosis of the toenails.   Lab Results  Component Value Date   HGBA1C 10.7 (A) 03/11/2018   Lab Results  Component Value Date   CREATININE 0.73 11/04/2017   BUN 11 11/04/2017   NA 132 (L) 11/04/2017   K 5.0 11/04/2017   CL 96 (L) 11/04/2017   CO2 24 11/04/2017        Assessment & Plan:  Insulin-requiring type 2 DM, with PAD: worse.   Patient Instructions  check your blood sugar twice a day.  vary the time of day when you check, between before the 3 meals, and at bedtime.  also check if you have symptoms of your blood sugar being too high or too low.  please keep a record of the readings and bring it to your next appointment here (or you can bring the meter itself).  You can write it on any piece of paper.  please call us sooner if your blood sugar goes below 70, or if you have a lot of readings over 200.  For now, please increase the insulin to 120 units with breakfast, and 90 with supper.  On this type of insulin schedule, you should eat meals on a regular schedule.  If a meal is missed or significantly delayed, your blood sugar could go low.   Please come back for a follow-up appointment in 2 months.  Please call us next week, to tell us how the blood sugar is doing.

## 2018-03-11 NOTE — Patient Instructions (Addendum)
check your blood sugar twice a day.  vary the time of day when you check, between before the 3 meals, and at bedtime.  also check if you have symptoms of your blood sugar being too high or too low.  please keep a record of the readings and bring it to your next appointment here (or you can bring the meter itself).  You can write it on any piece of paper.  please call us sooner if your blood sugar goes below 70, or if you have a lot of readings over 200.  For now, please increase the insulin to 120 units with breakfast, and 90 with supper.  On this type of insulin schedule, you should eat meals on a regular schedule.  If a meal is missed or significantly delayed, your blood sugar could go low.   Please come back for a follow-up appointment in 2 months.  Please call us next week, to tell us how the blood sugar is doing.

## 2018-03-12 ENCOUNTER — Encounter: Payer: Self-pay | Admitting: Physical Therapy

## 2018-03-12 ENCOUNTER — Ambulatory Visit: Payer: Medicare HMO | Attending: Family Medicine | Admitting: Physical Therapy

## 2018-03-12 DIAGNOSIS — M25652 Stiffness of left hip, not elsewhere classified: Secondary | ICD-10-CM | POA: Diagnosis not present

## 2018-03-12 DIAGNOSIS — M6281 Muscle weakness (generalized): Secondary | ICD-10-CM | POA: Insufficient documentation

## 2018-03-12 DIAGNOSIS — R293 Abnormal posture: Secondary | ICD-10-CM | POA: Diagnosis not present

## 2018-03-12 DIAGNOSIS — R2681 Unsteadiness on feet: Secondary | ICD-10-CM

## 2018-03-12 DIAGNOSIS — R2689 Other abnormalities of gait and mobility: Secondary | ICD-10-CM

## 2018-03-12 NOTE — Therapy (Signed)
[facility] [address] [place], [place], [address] Phone: [phone]   Fax:  [phone]  Physical Therapy Treatment  Patient Details  Name: Danielle Buchanan MRN: [id number] Date of Birth: 09-Mar-1956 Referring Provider: Maurice Small, MD   Encounter Date: 03/12/2018  PT End of Session - 03/12/18 0935    Visit Number  2    Number of Visits  29    Date for PT Re-Evaluation  06/03/18    Authorization Type  [employer]    Authorization Time Period  $3400 OOP max has been met prior to PT evaluation. Visit limit Medicare guidelines    PT Start Time  6761    PT Stop Time  0930    PT Time Calculation (min)  43 min       Past Medical History:  Diagnosis Date  . Allergic rhinitis   . Depression   . Diabetes ([place])   . Dyslipidemia   . HTN (hypertension)   . Insomnia   . OSA (obstructive sleep apnea)    no cpap  . Urinary incontinence     Past Surgical History:  Procedure Laterality Date  . ABDOMINAL AORTOGRAM W/LOWER EXTREMITY N/A 06/24/2017   Procedure: ABDOMINAL AORTOGRAM W/LOWER EXTREMITY;  Surgeon: Waynetta Sandy, MD;  Location: [facility];  Service: Cardiovascular;  Laterality: N/A;  . ABDOMINAL AORTOGRAM W/LOWER EXTREMITY Right 10/09/2017   Procedure: ABDOMINAL AORTOGRAM W/LOWER EXTREMITY;  Surgeon: Waynetta Sandy, MD;  Location: [facility];  Service: Cardiovascular;  Laterality: Right;  . AMPUTATION Left 07/23/2017   Procedure: AMPUTATION  BELOW KNEE;  Surgeon: Waynetta Sandy, MD;  Location: [facility];  Service: Vascular;  Laterality: Left;  . AMPUTATION Left 08/12/2017   Procedure: REVISION BELOW KNEE;  Surgeon: Waynetta Sandy, MD;  Location: [facility];  Service: Vascular;  Laterality: Left;  . AMPUTATION Left 08/27/2017   Procedure: left ABOVE KNEE AMPUTATION;  Surgeon: Waynetta Sandy, MD;  Location: [facility];  Service: Vascular;  Laterality: Left;  .  AMPUTATION Left 08/30/2017   Procedure: REVISION AMPUTATION ABOVE KNEE;  Surgeon: Serafina Mitchell, MD;  Location: [facility];  Service: Vascular;  Laterality: Left;  . AMPUTATION Right 11/04/2017   Procedure: AMPUTATION RIGHT FOURTH TOE;  Surgeon: Waynetta Sandy, MD;  Location: [facility];  Service: Vascular;  Laterality: Right;  . APPLICATION OF WOUND VAC Left 08/27/2017   Procedure: APPLICATION OF WOUND VAC;  Surgeon: Waynetta Sandy, MD;  Location: [facility];  Service: Vascular;  Laterality: Left;  . CARPAL TUNNEL RELEASE Right   . COLONOSCOPY    . LOWER EXTREMITY INTERVENTION Right 10/09/2017   Procedure: LOWER EXTREMITY INTERVENTION;  Surgeon: Waynetta Sandy, MD;  Location: [facility];  Service: Cardiovascular;  Laterality: Right;    There were no vitals filed for this visit.  Subjective Assessment - 03/12/18 0850    Subjective  Pt goes to see orthopedist this Friday; pt is having numbness in R 4th and 5th finger may have to have surgery on elbow.    Patient is accompained by:  Family member sister, IT trainer    Pertinent History  left TFA, right 4th toe amputation, DM2, PAD, HTN, right carpal tunnel release,    Limitations  Lifting;Standing;Walking;House hold activities    Patient Stated Goals  Walk with prosthesis in home & in community    Currently in Pain?  No/denies  [facility] Adult PT Treatment/Exercise - 03/12/18 0001      Transfers   Transfers  Sit to Stand;Stand to Sit    Sit to Stand  4: Min assist;With upper extremity assist;With armrests;From chair/3-in-1    Sit to Stand Details (indicate cue type and reason)  verbal cues for locking prosthetic knee    Stand to Sit  4: Min assist;With upper extremity assist;With armrests;To chair/3-in-1      Ambulation/Gait   Ambulation/Gait  Yes    Ambulation/Gait Assistance  4: Min assist    Ambulation/Gait Assistance Details  excessive UE weight bearing on RW; cues for  sequence    Ambulation Distance (Feet)  10 Feet 6    Assistive device  Rolling walker;Prosthesis locked TFA prosthesis    Gait Pattern  Step-to pattern;Decreased step length - right;Decreased stance time - left;Decreased stride length;Decreased weight shift to left;Left circumduction;Left hip hike;Antalgic;Lateral hip instability;Trunk flexed;Abducted - left;Poor foot clearance - left;Poor foot clearance - right    Ambulation Surface  Level;Indoor      Prosthetics   Prosthetic Care Comments   Reviewed PT instructions from 5/29 to wear prosthesis 2hrs 2x/day with recommendation to donne after toileting. PT recommended sitting only, not to transfer or gait currently. Remove prosthesis prior to toileting or transfering.  Sitting positioning with pillow or folded towel under right pelvis /thigh to level pelvis when wearing prosthesis.     Current prosthetic wear tolerance (days/week)   5/7 days since last visit    Current prosthetic wear tolerance (#hours/day)   2hrs    Current prosthetic weight-bearing tolerance (hours/day)   Pt tolerated standing for 3 mintues without c/o limb pain or back pain.     Residual limb condition   no skin issues per pt    Education Provided  Skin check;Residual limb care;Prosthetic cleaning;Ply sock cleaning;Proper Donning;Proper Doffing;Proper wear schedule/adjustment    Person(s) Educated  Patient sister    Education Method  Explanation;Demonstration;Verbal cues    Education Method  Verbalized understanding    Donning Prosthesis  Minimal assist    Doffing Prosthesis  Supervision               PT Short Term Goals - 03/05/18 2316      PT SHORT TERM GOAL #1   Title  Patient donnes prosthesis including tightening suspension strap with supervision. (All STGs Target Date 04/04/2018)    Time  4    Period  Weeks    Status  New    Target Date  04/04/18      PT SHORT TERM GOAL #2   Title  Patient tolerates daily prosthesis wear >6hrs total / day.     Time  4     Period  Weeks    Status  New    Target Date  04/04/18      PT SHORT TERM GOAL #3   Title  Patient sit to/from stand from chairs with armrests to rolling walker with prosthesis with supervision.     Time  4    Period  Weeks    Status  New    Target Date  04/04/18      PT SHORT TERM GOAL #4   Title  Patient ambulates 30' with rolling walker & locked prosthesis with minA.     Time  4    Period  Weeks    Status  New    Target Date  04/04/18        PT Long Term Goals -  03/05/18 2308      PT LONG TERM GOAL #1   Title  Patient & sister verbalize & demonstrate understanding of proper prosthetic care to enable safe use of prosthesis.  (All LTG Target Dates:  05/30/2018)    Time  12    Period  Weeks    Status  New    Target Date  05/30/18      PT LONG TERM GOAL #2   Title  Patient tolerates wear of Transfemoral prosthesis >75% of awake hours without skin issues or limb pain.     Time  12    Period  Weeks    Status  New    Target Date  05/30/18      PT LONG TERM GOAL #3   Title  Standing balance with RW support: reaching 10" anteriorly, picks up object from floor, scans environment & manages clothes for toileting modified indepenent.     Time  12    Period  Weeks    Status  New    Target Date  05/30/18      PT LONG TERM GOAL #4   Title  Patient ambulates 200' with RW & prosthesis with supervision for limited community mobility.     Time  12    Period  Weeks    Status  New    Target Date  05/30/18      PT LONG TERM GOAL #5   Title  Patient ambulates 48' around furniture with RW & prosthesis modified independent for household mobility.     Time  12    Period  Weeks    Status  New    Target Date  05/30/18      Additional Long Term Goals   Additional Long Term Goals  Yes      PT LONG TERM GOAL #6   Title  Patient negotiates stairs with 2 rails, ramps & curbs with RW & prosthesis with minimal assist for community access.     Time  12    Period  Weeks    Status  New     Target Date  05/30/18            Plan - 03/12/18 1415    Clinical Impression Statement  Pt is progressing with wear time of prosthesis at home.  Pt is progressing with understanding of safe sit<>stand with RW and prosthesis requiring verbal cues and pt progressed with gait distance and less assist performing at [place] A level although with heavy UE support on RW.                        Rehab Potential  Good    Clinical Impairments Affecting Rehab Potential  pt unable to read / write and sister who is caregiver reports limited ability to read / write    PT Frequency  3x / week 3x/wk for 4 weeks then 2x/wk for 8 weeks    PT Duration  12 weeks    PT Treatment/Interventions  ADLs/Self Care Home Management;DME Instruction;Gait training;Stair training;Functional mobility training;Therapeutic activities;Therapeutic exercise;Balance training;Neuromuscular re-education;Patient/family education;Prosthetic Training;Passive range of motion;Vestibular;Canalith Repostioning    PT Next Visit Plan  review prosthetic care, work on sit to/from stand, HEP at sink (open cabinet to enable sit down with locked prosthesis)    Consulted and Agree with Plan of Care  Patient;Family member/caregiver    Family Member Consulted  sister, [name]       Patient will  benefit from skilled therapeutic intervention in order to improve the following deficits and impairments:  Abnormal gait, Decreased activity tolerance, Decreased balance, Decreased coordination, Decreased endurance, Decreased knowledge of use of DME, Decreased mobility, Decreased range of motion, Decreased scar mobility, Decreased strength, Impaired flexibility, Postural dysfunction, Obesity, Prosthetic Dependency  Visit Diagnosis: No diagnosis found.     Problem List Patient Active Problem List   Diagnosis Date Noted  . Atherosclerosis of native arteries of the extremities with gangrene ([place]) 11/01/2017  . Unilateral AKA, left ([place])   .  Post-operative pain   . OSA (obstructive sleep apnea)   . Benign essential HTN   . Diabetes mellitus type 2 in obese ([place])   . Hyperkalemia   . Leukocytosis   . Acute blood loss anemia   . Amputation stump infection ([facility]) 08/23/2017  . Amputation of left lower extremity above knee upon examination ([place]) 08/23/2017  . PAD (peripheral artery disease) ([place]) 07/23/2017  . Cellulitis 06/10/2017  . Hyponatremia 06/10/2017  . Fever 06/10/2017  . Rash 05/24/2017  . Diabetes ([place])   . HTN (hypertension)   . Allergic rhinitis   . Insomnia   . Depression     Bjorn Loser, PTA  03/12/18, 2:20 PM [facility] [address] [place], [place], [address] Phone: [phone]   Fax:  [phone]  Name: Danielle Buchanan MRN: [id number] Date of Birth: 10/16/1955

## 2018-03-13 ENCOUNTER — Encounter: Payer: Self-pay | Admitting: Physical Therapy

## 2018-03-13 ENCOUNTER — Ambulatory Visit: Payer: Medicare HMO | Admitting: Physical Therapy

## 2018-03-13 DIAGNOSIS — R2681 Unsteadiness on feet: Secondary | ICD-10-CM

## 2018-03-13 DIAGNOSIS — R2689 Other abnormalities of gait and mobility: Secondary | ICD-10-CM | POA: Diagnosis not present

## 2018-03-13 DIAGNOSIS — R293 Abnormal posture: Secondary | ICD-10-CM

## 2018-03-13 DIAGNOSIS — M25652 Stiffness of left hip, not elsewhere classified: Secondary | ICD-10-CM

## 2018-03-13 DIAGNOSIS — M6281 Muscle weakness (generalized): Secondary | ICD-10-CM | POA: Diagnosis not present

## 2018-03-14 ENCOUNTER — Encounter: Payer: Medicare HMO | Admitting: Physical Therapy

## 2018-03-14 DIAGNOSIS — G5621 Lesion of ulnar nerve, right upper limb: Secondary | ICD-10-CM | POA: Diagnosis not present

## 2018-03-14 NOTE — Therapy (Signed)
[facility] [address] [place], [place], [address] Phone: [phone]   Fax:  [phone]  Physical Therapy Treatment  Patient Details  Name: Danielle Buchanan MRN: [id number] Date of Birth: [birth year] Referring Provider: Maurice Small, MD   Encounter Date: 03/13/2018  PT End of Session - 03/13/18 1057    Visit Number  3    Number of Visits  29    Date for PT Re-Evaluation  06/03/18    Authorization Type  [employer]    Authorization Time Period  $3400 OOP max has been met prior to PT evaluation. Visit limit Medicare guidelines    PT Start Time  [phone]    PT Stop Time  0845    PT Time Calculation (min)  47 min    Equipment Utilized During Treatment  Gait belt    Activity Tolerance  Patient tolerated treatment well    Behavior During Therapy  WFL for tasks assessed/performed       Past Medical History:  Diagnosis Date  . Allergic rhinitis   . Depression   . Diabetes ([place])   . Dyslipidemia   . HTN (hypertension)   . Insomnia   . OSA (obstructive sleep apnea)    no cpap  . Urinary incontinence     Past Surgical History:  Procedure Laterality Date  . ABDOMINAL AORTOGRAM W/LOWER EXTREMITY N/A 06/24/2017   Procedure: ABDOMINAL AORTOGRAM W/LOWER EXTREMITY;  Surgeon: Waynetta Sandy, MD;  Location: [facility];  Service: Cardiovascular;  Laterality: N/A;  . ABDOMINAL AORTOGRAM W/LOWER EXTREMITY Right 10/09/2017   Procedure: ABDOMINAL AORTOGRAM W/LOWER EXTREMITY;  Surgeon: Waynetta Sandy, MD;  Location: [facility];  Service: Cardiovascular;  Laterality: Right;  . AMPUTATION Left 07/23/2017   Procedure: AMPUTATION  BELOW KNEE;  Surgeon: Waynetta Sandy, MD;  Location: [facility];  Service: Vascular;  Laterality: Left;  . AMPUTATION Left 08/12/2017   Procedure: REVISION BELOW KNEE;  Surgeon: Waynetta Sandy, MD;  Location: [facility];  Service: Vascular;  Laterality: Left;  .  AMPUTATION Left 08/27/2017   Procedure: left ABOVE KNEE AMPUTATION;  Surgeon: Waynetta Sandy, MD;  Location: [facility];  Service: Vascular;  Laterality: Left;  . AMPUTATION Left 08/30/2017   Procedure: REVISION AMPUTATION ABOVE KNEE;  Surgeon: Serafina Mitchell, MD;  Location: [facility];  Service: Vascular;  Laterality: Left;  . AMPUTATION Right 11/04/2017   Procedure: AMPUTATION RIGHT FOURTH TOE;  Surgeon: Waynetta Sandy, MD;  Location: [facility];  Service: Vascular;  Laterality: Right;  . APPLICATION OF WOUND VAC Left 08/27/2017   Procedure: APPLICATION OF WOUND VAC;  Surgeon: Waynetta Sandy, MD;  Location: [facility];  Service: Vascular;  Laterality: Left;  . CARPAL TUNNEL RELEASE Right   . COLONOSCOPY    . LOWER EXTREMITY INTERVENTION Right 10/09/2017   Procedure: LOWER EXTREMITY INTERVENTION;  Surgeon: Waynetta Sandy, MD;  Location: [facility];  Service: Cardiovascular;  Laterality: Right;    There were no vitals filed for this visit.  Subjective Assessment - 03/13/18 0758    Subjective  Reports wearing prosthesis for 1.5-2 hrs 2x/day donning after toileting so she does not have transfer while wearing it.     Patient is accompained by:  Family member sister, [name]    Pertinent History  left TFA, right 4th toe amputation, DM2, PAD, HTN, right carpal tunnel release,    Limitations  Lifting;Standing;Walking;House hold activities    Patient Stated Goals  Walk with prosthesis in home & in community    Currently in Pain?  No/denies                       [facility] Adult PT Treatment/Exercise - 03/13/18 0800      Transfers   Transfers  Sit to Stand;Stand to Sit;Stand Pivot Transfers    Sit to Stand  4: Min assist;With upper extremity assist;With armrests;From chair/3-in-1;4: Min guard to RW for stability, locking TFA while arising    Sit to Stand Details  Verbal cues for technique;Visual cues for safe use of DME/AE;Verbal cues for safe use  of DME/AE;Verbal cues for precautions/safety    Sit to Stand Details (indicate cue type and reason)  MinA from chair without armrests    Stand to Sit  4: Min guard;With upper extremity assist;With armrests;To chair/3-in-1;4: Min assist from RW for stability with TFA prosthesis locked    Stand to Sit Details (indicate cue type and reason)  Visual cues for safe use of DME/AE;Verbal cues for safe use of DME/AE;Verbal cues for precautions/safety;Verbal cues for technique    Stand to Sit Details  MinA to chair without armrests    Stand Pivot Transfers  4: Min assist;With armrests RW with locked TFA prosthesis    Stand Pivot Transfer Details (indicate cue type and reason)  PT demo & instructed technique for stand pivot transfer right & left simulating transfer to toilet.  Pt performed with PT verbal cues. Pt & her sister verbalized understanding.       Ambulation/Gait   Ambulation/Gait  Yes    Ambulation/Gait Assistance  4: Min assist    Ambulation/Gait Assistance Details  Tactile & verbal cues for wt shift over prosthesis in stance, upright posture & sequence.     Ambulation Distance (Feet)  20 Feet    Assistive device  Rolling walker;Prosthesis locked TFA prosthesis    Gait Pattern  Step-to pattern;Decreased step length - right;Decreased stance time - left;Decreased stride length;Decreased weight shift to left;Left circumduction;Left hip hike;Antalgic;Lateral hip instability;Trunk flexed;Abducted - left;Poor foot clearance - left;Poor foot clearance - right    Ambulation Surface  Indoor;Level      Self-Care   Self-Care  ADL's    ADL's  toileting with TFA prosthesis by spreading prosthetic leg & keeping sound limb facing forward, use right hand to direct privates so urinates away from prosthesis.       Prosthetics   Prosthetic Care Comments   donne prosthesis after toileting. ~1hr after donning, perfrom stand pivot transfer with sister's assistance. Wear prosthesis 2-3 hrs 2x/day.     Current  prosthetic wear tolerance (days/week)   6 days of last week    Current prosthetic wear tolerance (#hours/day)   1.5-2hrs 2x/day    Current prosthetic weight-bearing tolerance (hours/day)   --    Residual limb condition   no skin issues per pt    Education Provided  Skin check;Residual limb care;Prosthetic cleaning;Ply sock cleaning;Proper Donning;Proper Doffing;Proper wear schedule/adjustment    Person(s) Educated  Patient;Other (comment) sister    Education Method  Explanation;Demonstration;Tactile cues;Verbal cues    Education Method  Verbalized understanding;Returned demonstration;Tactile cues required;Verbal cues required;Needs further instruction               PT Short Term Goals - 03/13/18 1058      PT SHORT TERM GOAL #1   Title  Patient donnes prosthesis including tightening suspension strap with supervision. (All STGs Target Date 04/04/2018)    Time  4    Period  Weeks    Status  On-going    Target Date  04/04/18      PT SHORT TERM GOAL #2   Title  Patient tolerates daily prosthesis wear >6hrs total / day.     Time  4    Period  Weeks    Status  On-going    Target Date  04/04/18      PT SHORT TERM GOAL #3   Title  Patient sit to/from stand from chairs with armrests to rolling walker with prosthesis with supervision.     Time  4    Period  Weeks    Status  On-going    Target Date  04/04/18      PT SHORT TERM GOAL #4   Title  Patient ambulates 30' with rolling walker & locked prosthesis with minA.     Time  4    Period  Weeks    Status  On-going    Target Date  04/04/18        PT Long Term Goals - 03/13/18 1058      PT LONG TERM GOAL #1   Title  Patient & sister verbalize & demonstrate understanding of proper prosthetic care to enable safe use of prosthesis.  (All LTG Target Dates:  05/30/2018)    Time  12    Period  Weeks    Status  On-going    Target Date  05/30/18      PT LONG TERM GOAL #2   Title  Patient tolerates wear of Transfemoral prosthesis  >75% of awake hours without skin issues or limb pain.     Time  12    Period  Weeks    Status  On-going    Target Date  05/30/18      PT LONG TERM GOAL #3   Title  Standing balance with RW support: reaching 10" anteriorly, picks up object from floor, scans environment & manages clothes for toileting modified indepenent.     Time  12    Period  Weeks    Status  On-going    Target Date  05/30/18      PT LONG TERM GOAL #4   Title  Patient ambulates 200' with RW & prosthesis with supervision for limited community mobility.     Time  12    Period  Weeks    Status  On-going    Target Date  05/30/18      PT LONG TERM GOAL #5   Title  Patient ambulates 18' around furniture with RW & prosthesis modified independent for household mobility.     Time  12    Period  Weeks    Status  On-going    Target Date  05/30/18      PT LONG TERM GOAL #6   Title  Patient negotiates stairs with 2 rails, ramps & curbs with RW & prosthesis with minimal assist for community access.     Time  12    Period  Weeks    Status  On-going    Target Date  05/30/18            Plan - 03/13/18 1059    Clinical Impression Statement  Patient & her sister appear to understand stand pivot transfers with RW to enable patient with her sister's assist for transfers to toilet.      Rehab Potential  Good    Clinical Impairments Affecting Rehab Potential  pt unable to read / write and sister who is caregiver reports limited ability to read / write    PT Frequency  3x / week 3x/wk for 4 weeks then 2x/wk for 8 weeks    PT Duration  12 weeks    PT Treatment/Interventions  ADLs/Self Care Home Management;DME Instruction;Gait training;Stair training;Functional mobility training;Therapeutic activities;Therapeutic exercise;Balance training;Neuromuscular re-education;Patient/family education;Prosthetic Training;Passive range of motion;Vestibular;Canalith Repostioning    PT Next Visit Plan  review prosthetic care, work on sit  to/from stand & stand-pivot transfers, HEP at sink (open cabinet to enable sit down with locked prosthesis)    Consulted and Agree with Plan of Care  Patient;Family member/caregiver    Family Member Consulted  sister, [name]       Patient will benefit from skilled therapeutic intervention in order to improve the following deficits and impairments:  Abnormal gait, Decreased activity tolerance, Decreased balance, Decreased coordination, Decreased endurance, Decreased knowledge of use of DME, Decreased mobility, Decreased range of motion, Decreased scar mobility, Decreased strength, Impaired flexibility, Postural dysfunction, Obesity, Prosthetic Dependency  Visit Diagnosis: Stiffness of left hip, not elsewhere classified  Muscle weakness (generalized)  Unsteadiness on feet  Other abnormalities of gait and mobility  Abnormal posture     Problem List Patient Active Problem List   Diagnosis Date Noted  . Atherosclerosis of native arteries of the extremities with gangrene ([place]) 11/01/2017  . Unilateral AKA, left ([place])   . Post-operative pain   . OSA (obstructive sleep apnea)   . Benign essential HTN   . Diabetes mellitus type 2 in obese ([place])   . Hyperkalemia   . Leukocytosis   . Acute blood loss anemia   . Amputation stump infection ([place]) 08/23/2017  . Amputation of left lower extremity above knee upon examination ([place]) 08/23/2017  . PAD (peripheral artery disease) ([facility]) 07/23/2017  . Cellulitis 06/10/2017  . Hyponatremia 06/10/2017  . Fever 06/10/2017  . Rash 05/24/2017  . Diabetes ([place])   . HTN (hypertension)   . Allergic rhinitis   . Insomnia   . Depression     Gee Habig PT, DPT 03/14/2018, 11:02 AM  [facility] [address] [place], [place], [address] Phone: [phone]   Fax:  [phone]  Name: Danielle Buchanan MRN: [id number] Date of Birth: 1955-10-15

## 2018-03-17 ENCOUNTER — Ambulatory Visit: Payer: Medicare HMO | Admitting: Physical Therapy

## 2018-03-19 ENCOUNTER — Ambulatory Visit: Payer: Medicare HMO | Admitting: Physical Therapy

## 2018-03-20 ENCOUNTER — Ambulatory Visit: Payer: Medicare HMO | Admitting: Physical Therapy

## 2018-03-20 ENCOUNTER — Encounter: Payer: Self-pay | Admitting: Physical Therapy

## 2018-03-20 DIAGNOSIS — M25652 Stiffness of left hip, not elsewhere classified: Secondary | ICD-10-CM | POA: Diagnosis not present

## 2018-03-20 DIAGNOSIS — R2689 Other abnormalities of gait and mobility: Secondary | ICD-10-CM | POA: Diagnosis not present

## 2018-03-20 DIAGNOSIS — R2681 Unsteadiness on feet: Secondary | ICD-10-CM | POA: Diagnosis not present

## 2018-03-20 DIAGNOSIS — R293 Abnormal posture: Secondary | ICD-10-CM | POA: Diagnosis not present

## 2018-03-20 DIAGNOSIS — M6281 Muscle weakness (generalized): Secondary | ICD-10-CM

## 2018-03-21 NOTE — Therapy (Signed)
[facility] [address] [place], [place], [address] Phone: [phone]   Fax:  [phone]  Physical Therapy Treatment  Patient Details  Name: Danielle Buchanan MRN: [id number] Date of Birth: 07-05-1956 Referring Provider: Maurice Small, MD   Encounter Date: 03/20/2018  PT End of Session - 03/20/18 0937    Visit Number  4    Number of Visits  29    Date for PT Re-Evaluation  06/03/18    Authorization Type  [employer]    Authorization Time Period  $3400 OOP max has been met prior to PT evaluation. Visit limit Medicare guidelines    PT Start Time  1194    PT Stop Time  1015    PT Time Calculation (min)  40 min    Equipment Utilized During Treatment  Gait belt    Activity Tolerance  Patient tolerated treatment well    Behavior During Therapy  WFL for tasks assessed/performed       Past Medical History:  Diagnosis Date  . Allergic rhinitis   . Depression   . Diabetes ([place])   . Dyslipidemia   . HTN (hypertension)   . Insomnia   . OSA (obstructive sleep apnea)    no cpap  . Urinary incontinence     Past Surgical History:  Procedure Laterality Date  . ABDOMINAL AORTOGRAM W/LOWER EXTREMITY N/A 06/24/2017   Procedure: ABDOMINAL AORTOGRAM W/LOWER EXTREMITY;  Surgeon: Waynetta Sandy, MD;  Location: [facility];  Service: Cardiovascular;  Laterality: N/A;  . ABDOMINAL AORTOGRAM W/LOWER EXTREMITY Right 10/09/2017   Procedure: ABDOMINAL AORTOGRAM W/LOWER EXTREMITY;  Surgeon: Waynetta Sandy, MD;  Location: [facility];  Service: Cardiovascular;  Laterality: Right;  . AMPUTATION Left 07/23/2017   Procedure: AMPUTATION  BELOW KNEE;  Surgeon: Waynetta Sandy, MD;  Location: [facility];  Service: Vascular;  Laterality: Left;  . AMPUTATION Left 08/12/2017   Procedure: REVISION BELOW KNEE;  Surgeon: Waynetta Sandy, MD;  Location: [facility];  Service: Vascular;  Laterality: Left;  .  AMPUTATION Left 08/27/2017   Procedure: left ABOVE KNEE AMPUTATION;  Surgeon: Waynetta Sandy, MD;  Location: [facility];  Service: Vascular;  Laterality: Left;  . AMPUTATION Left 08/30/2017   Procedure: REVISION AMPUTATION ABOVE KNEE;  Surgeon: Serafina Mitchell, MD;  Location: [facility];  Service: Vascular;  Laterality: Left;  . AMPUTATION Right 11/04/2017   Procedure: AMPUTATION RIGHT FOURTH TOE;  Surgeon: Waynetta Sandy, MD;  Location: [facility];  Service: Vascular;  Laterality: Right;  . APPLICATION OF WOUND VAC Left 08/27/2017   Procedure: APPLICATION OF WOUND VAC;  Surgeon: Waynetta Sandy, MD;  Location: [facility];  Service: Vascular;  Laterality: Left;  . CARPAL TUNNEL RELEASE Right   . COLONOSCOPY    . LOWER EXTREMITY INTERVENTION Right 10/09/2017   Procedure: LOWER EXTREMITY INTERVENTION;  Surgeon: Waynetta Sandy, MD;  Location: [facility];  Service: Cardiovascular;  Laterality: Right;    There were no vitals filed for this visit.  Subjective Assessment - 03/20/18 0937    Patient is accompained by:  Family member nephew in law- [name], neice [name]    Pertinent History  left TFA, right 4th toe amputation, DM2, PAD, HTN, right carpal tunnel release,    Limitations  Lifting;Standing;Walking;House hold activities    Patient Stated Goals  Walk with prosthesis in home & in community          [facility] Adult PT Treatment/Exercise -  03/20/18 0938      Transfers   Transfers  Sit to Stand;Stand to Sit;Stand Pivot Transfers    Sit to Stand  4: Min assist;With upper extremity assist;With armrests;From chair/3-in-1;4: Min guard    Sit to Stand Details  Verbal cues for technique;Visual cues for safe use of DME/AE;Verbal cues for safe use of DME/AE;Verbal cues for precautions/safety    Sit to Stand Details (indicate cue type and reason)  needs RW to stabilize on once standing    Stand to Sit  4: Min guard;With upper extremity assist;With armrests;To chair/3-in-1;4:  Min assist    Stand to Sit Details (indicate cue type and reason)  Visual cues for safe use of DME/AE;Verbal cues for safe use of DME/AE;Verbal cues for precautions/safety;Verbal cues for technique    Stand to Sit Details  reminder cues for prosthetic placement out and on technique to unlock prosthesis once seated.     Stand Pivot Transfers  4: Min assist;4: Min guard    Stand Pivot Transfer Details (indicate cue type and reason)  wheelchair<>regular chair with armrests x 2 reps each way with RW/prosthesis. cues needed on sequencing, posture and RW use. min guard assist initially, progressed to min assist with last rep due to fatigue.       Prosthetics   Prosthetic Care Comments   pt presents with new caregivers today for prosthetic education. PTA covered cleaning/storage for drying of liners, to alternate liners every day, how to don/doff liner/prosthesis, skin care with use of lotion at night/removing prior to donning liner, use of antipersperant to assist with decreased sweating/heat rash control, and transfers with RW/prosthesis. Briefly discussed use of socks with rationale for when they are needed and why they are used. All parties will need further instruction on this concept when the time comes to use them.      Current prosthetic wear tolerance (days/week)   none since her sister passed this weekend    Current prosthetic wear tolerance (#hours/day)   1.5-2hrs 2x/day    Residual limb condition   intact with no issues.     Education Provided  Skin check;Residual limb care;Prosthetic cleaning;Ply sock cleaning;Proper Donning;Proper Doffing;Proper wear schedule/adjustment    Person(s) Educated  Patient;Other (comment);Caregiver(s) neice, nephew in law    Education Method  Explanation;Demonstration;Verbal cues    Education Method  Verbalized understanding;Verbal cues required;Needs further instruction    Donning Prosthesis  Minimal assist    Doffing Prosthesis  Supervision         PT Short  Term Goals - 03/13/18 1058      PT SHORT TERM GOAL #1   Title  Patient donnes prosthesis including tightening suspension strap with supervision. (All STGs Target Date 04/04/2018)    Time  4    Period  Weeks    Status  On-going    Target Date  04/04/18      PT SHORT TERM GOAL #2   Title  Patient tolerates daily prosthesis wear >6hrs total / day.     Time  4    Period  Weeks    Status  On-going    Target Date  04/04/18      PT SHORT TERM GOAL #3   Title  Patient sit to/from stand from chairs with armrests to rolling walker with prosthesis with supervision.     Time  4    Period  Weeks    Status  On-going    Target Date  04/04/18      PT SHORT  TERM GOAL #4   Title  Patient ambulates 8' with rolling walker & locked prosthesis with minA.     Time  4    Period  Weeks    Status  On-going    Target Date  04/04/18        PT Long Term Goals - 03/13/18 1058      PT LONG TERM GOAL #1   Title  Patient & sister verbalize & demonstrate understanding of proper prosthetic care to enable safe use of prosthesis.  (All LTG Target Dates:  05/30/2018)    Time  12    Period  Weeks    Status  On-going    Target Date  05/30/18      PT LONG TERM GOAL #2   Title  Patient tolerates wear of Transfemoral prosthesis >75% of awake hours without skin issues or limb pain.     Time  12    Period  Weeks    Status  On-going    Target Date  05/30/18      PT LONG TERM GOAL #3   Title  Standing balance with RW support: reaching 10" anteriorly, picks up object from floor, scans environment & manages clothes for toileting modified indepenent.     Time  12    Period  Weeks    Status  On-going    Target Date  05/30/18      PT LONG TERM GOAL #4   Title  Patient ambulates 200' with RW & prosthesis with supervision for limited community mobility.     Time  12    Period  Weeks    Status  On-going    Target Date  05/30/18      PT LONG TERM GOAL #5   Title  Patient ambulates 11' around furniture with RW  & prosthesis modified independent for household mobility.     Time  12    Period  Weeks    Status  On-going    Target Date  05/30/18      PT LONG TERM GOAL #6   Title  Patient negotiates stairs with 2 rails, ramps & curbs with RW & prosthesis with minimal assist for community access.     Time  12    Period  Weeks    Status  On-going    Target Date  05/30/18            Plan - 03/20/18 1601    Clinical Impression Statement  Pt presents to PT with new caregivers due to the unexpected passing of her sister this past Saturday. Skilled session focused on prosthetic education to new caregivers (review for pt) as they have no familiarity with helping pt with her prosthesis. Have requested pt return to wearing the prosthesis, still removing it for toileting at this time as she is now home alone without any assistance for toileting. Niece and Nephew in law appear motivated to help pt, however did express feeling a little overwhelmed. Will need to take this slow with regards to prosthetic edcuation and incorportion into thier daily life. Pt is progressing toward goals and should benefit from continued PT to progress toward unmet goals.                        Rehab Potential  Good    Clinical Impairments Affecting Rehab Potential  pt unable to read / write and sister who is caregiver reports limited ability to read / write  PT Frequency  3x / week 3x/wk for 4 weeks then 2x/wk for 8 weeks    PT Duration  12 weeks    PT Treatment/Interventions  ADLs/Self Care Home Management;DME Instruction;Gait training;Stair training;Functional mobility training;Therapeutic activities;Therapeutic exercise;Balance training;Neuromuscular re-education;Patient/family education;Prosthetic Training;Passive range of motion;Vestibular;Canalith Repostioning    PT Next Visit Plan  review prosthetic care, work on sit to/from stand & stand-pivot transfers, HEP at sink (open cabinet to enable sit down with locked prosthesis)     Consulted and Agree with Plan of Care  Patient;Family member/caregiver    Family Member Consulted  sister, [name]       Patient will benefit from skilled therapeutic intervention in order to improve the following deficits and impairments:  Abnormal gait, Decreased activity tolerance, Decreased balance, Decreased coordination, Decreased endurance, Decreased knowledge of use of DME, Decreased mobility, Decreased range of motion, Decreased scar mobility, Decreased strength, Impaired flexibility, Postural dysfunction, Obesity, Prosthetic Dependency  Visit Diagnosis: Muscle weakness (generalized)  Unsteadiness on feet  Other abnormalities of gait and mobility  Abnormal posture     Problem List Patient Active Problem List   Diagnosis Date Noted  . Atherosclerosis of native arteries of the extremities with gangrene ([place]) 11/01/2017  . Unilateral AKA, left ([place])   . Post-operative pain   . OSA (obstructive sleep apnea)   . Benign essential HTN   . Diabetes mellitus type 2 in obese ([place])   . Hyperkalemia   . Leukocytosis   . Acute blood loss anemia   . Amputation stump infection ([place]) 08/23/2017  . Amputation of left lower extremity above knee upon examination ([place]) 08/23/2017  . PAD (peripheral artery disease) ([place]) 07/23/2017  . Cellulitis 06/10/2017  . Hyponatremia 06/10/2017  . Fever 06/10/2017  . Rash 05/24/2017  . Diabetes ([place])   . HTN (hypertension)   . Allergic rhinitis   . Insomnia   . Depression     [facility], [place], [place] [facility] [address] [place],  [address] [phone] 03/21/18, 12:58 PM   Name: Danielle Buchanan MRN: [id number] Date of Birth: 1956/04/16

## 2018-03-24 ENCOUNTER — Ambulatory Visit: Payer: Medicare HMO | Admitting: Physical Therapy

## 2018-03-24 ENCOUNTER — Encounter: Payer: Self-pay | Admitting: Physical Therapy

## 2018-03-24 DIAGNOSIS — R2689 Other abnormalities of gait and mobility: Secondary | ICD-10-CM | POA: Diagnosis not present

## 2018-03-24 DIAGNOSIS — M25652 Stiffness of left hip, not elsewhere classified: Secondary | ICD-10-CM

## 2018-03-24 DIAGNOSIS — M6281 Muscle weakness (generalized): Secondary | ICD-10-CM | POA: Diagnosis not present

## 2018-03-24 DIAGNOSIS — R293 Abnormal posture: Secondary | ICD-10-CM

## 2018-03-24 DIAGNOSIS — R2681 Unsteadiness on feet: Secondary | ICD-10-CM | POA: Diagnosis not present

## 2018-03-24 NOTE — Therapy (Signed)
[facility] [address] [place], [place], [address] Phone: [phone]   Fax:  [phone]  Physical Therapy Treatment  Patient Details  Name: Danielle Buchanan MRN: [id number] Date of Birth: Dec 27, 1955 Referring Provider: Maurice Small, MD   Encounter Date: 03/24/2018  PT End of Session - 03/24/18 1114    Visit Number  5    Number of Visits  29    Date for PT Re-Evaluation  06/03/18    Authorization Type  [employer]    Authorization Time Period  $3400 OOP max has been met prior to PT evaluation. Visit limit Medicare guidelines    PT Start Time  7062    PT Stop Time  1108    PT Time Calculation (min)  53 min    Equipment Utilized During Treatment  Gait belt    Activity Tolerance  Patient tolerated treatment well    Behavior During Therapy  WFL for tasks assessed/performed       Past Medical History:  Diagnosis Date  . Allergic rhinitis   . Depression   . Diabetes ([place])   . Dyslipidemia   . HTN (hypertension)   . Insomnia   . OSA (obstructive sleep apnea)    no cpap  . Urinary incontinence     Past Surgical History:  Procedure Laterality Date  . ABDOMINAL AORTOGRAM W/LOWER EXTREMITY N/A 06/24/2017   Procedure: ABDOMINAL AORTOGRAM W/LOWER EXTREMITY;  Surgeon: Waynetta Sandy, MD;  Location: [facility];  Service: Cardiovascular;  Laterality: N/A;  . ABDOMINAL AORTOGRAM W/LOWER EXTREMITY Right 10/09/2017   Procedure: ABDOMINAL AORTOGRAM W/LOWER EXTREMITY;  Surgeon: Waynetta Sandy, MD;  Location: [facility];  Service: Cardiovascular;  Laterality: Right;  . AMPUTATION Left 07/23/2017   Procedure: AMPUTATION  BELOW KNEE;  Surgeon: Waynetta Sandy, MD;  Location: [facility];  Service: Vascular;  Laterality: Left;  . AMPUTATION Left 08/12/2017   Procedure: REVISION BELOW KNEE;  Surgeon: Waynetta Sandy, MD;  Location: [facility];  Service: Vascular;  Laterality: Left;  .  AMPUTATION Left 08/27/2017   Procedure: left ABOVE KNEE AMPUTATION;  Surgeon: Waynetta Sandy, MD;  Location: [facility];  Service: Vascular;  Laterality: Left;  . AMPUTATION Left 08/30/2017   Procedure: REVISION AMPUTATION ABOVE KNEE;  Surgeon: Serafina Mitchell, MD;  Location: [facility];  Service: Vascular;  Laterality: Left;  . AMPUTATION Right 11/04/2017   Procedure: AMPUTATION RIGHT FOURTH TOE;  Surgeon: Waynetta Sandy, MD;  Location: [facility];  Service: Vascular;  Laterality: Right;  . APPLICATION OF WOUND VAC Left 08/27/2017   Procedure: APPLICATION OF WOUND VAC;  Surgeon: Waynetta Sandy, MD;  Location: [facility];  Service: Vascular;  Laterality: Left;  . CARPAL TUNNEL RELEASE Right   . COLONOSCOPY    . LOWER EXTREMITY INTERVENTION Right 10/09/2017   Procedure: LOWER EXTREMITY INTERVENTION;  Surgeon: Waynetta Sandy, MD;  Location: [facility];  Service: Cardiovascular;  Laterality: Right;    There were no vitals filed for this visit.  Subjective Assessment - 03/24/18 1024    Subjective  She wore prosthesis 2-3 hrs 2x for Friday & saturday. Not  Sunday due to pain in pubic area from prosthesis pressure.     Patient is accompained by:  Family member niece, [name] & nephew, [name]    Pertinent History  left TFA, right 4th toe amputation, DM2, PAD, HTN, right carpal tunnel release,    Limitations  Lifting;Standing;Walking;House hold activities  Patient Stated Goals  Walk with prosthesis in home & in community                       [facility] Adult PT Treatment/Exercise - 03/24/18 1015      Transfers   Transfers  Sit to Stand;Stand to [employer] Transfers    Sit to Stand  4: Min guard;5: Supervision;With upper extremity assist;With armrests;From chair/3-in-1 chairs with armrests SBA & without armrests min guard to RW    Sit to Stand Details  Verbal cues for technique;Visual cues for safe use of DME/AE;Verbal cues for safe use of DME/AE;Verbal  cues for precautions/safety    Stand to Sit  5: Supervision;4: Min guard;With upper extremity assist;With armrests;To chair/3-in-1 chairs with armrests SBA & without armrests min guard to RW    Stand to Sit Details (indicate cue type and reason)  Visual cues for safe use of DME/AE;Verbal cues for safe use of DME/AE;Verbal cues for precautions/safety;Verbal cues for technique    Stand Pivot Transfers  4: Min assist;4: Min guard;With armrests with RW & locked prosthesis    Stand Pivot Transfer Details (indicate cue type and reason)  simulated toilet transfer, cues on managing pants with 1 hand on RW,       Ambulation/Gait   Ambulation/Gait  Yes    Ambulation/Gait Assistance  4: Min assist    Ambulation/Gait Assistance Details  demo & verbal cues on step length, wt shift over prosthesis in stance, upright posture and sequence.     Ambulation Distance (Feet)  15 Feet 15' X 2    Assistive device  Rolling walker;Prosthesis    Gait Pattern  Step-to pattern;Decreased step length - right;Decreased stance time - left;Decreased stride length;Decreased weight shift to left;Left circumduction;Left hip hike;Antalgic;Lateral hip instability;Trunk flexed;Abducted - left;Poor foot clearance - left;Poor foot clearance - right    Ambulation Surface  Level;Indoor      Self-Care   ADL's  toileting with TFA prosthesis by spreading prosthetic leg & keeping sound limb facing forward, use right hand to direct privates so urinates away from prosthesis.  Urinary incontinence & dehydration, recommend timing toileting initially 1.5-2 hrs.        Prosthetics   Prosthetic Care Comments   adjusting ply socks & donning socks;  lock mechanism in locked position,  need left leg rests on w/c for safety when transporting with prosthesis on limb, proper method for tightening suspension strap standing    Current prosthetic wear tolerance (days/week)   2 of 3 days since last PT appt    Current prosthetic wear tolerance (#hours/day)    2-3 hrs 2x/day    Residual limb condition   intact with no issues.     Education Provided  Skin check;Residual limb care;Prosthetic cleaning;Ply sock cleaning;Proper Donning;Proper Doffing;Proper wear schedule/adjustment;Other (comment) see prosthetic care comments    Person(s) Educated  Patient;Caregiver(s)    Education Method  Explanation;Demonstration;Tactile cues;Verbal cues    Education Method  Verbalized understanding;Returned demonstration;Tactile cues required;Verbal cues required;Needs further instruction             PT Education - 03/24/18 1118    Education Details  Toileting & stand-pivot transfers with RW/prosthesis, prosthetic gait with RW with niece assisting and set-up chair 10' away with 90* turn only to sit; Using gait belt to control lowering to floor if needed with balance loss/fall.   Call non-emergency number for fire dept for assistance with fall no injuries and set-up emergency home plan with fire  dept.     Person(s) Educated  Patient;Caregiver(s)    Methods  Explanation;Demonstration;Tactile cues;Verbal cues    Comprehension  Verbalized understanding;Verbal cues required;Returned demonstration;Tactile cues required;Need further instruction       PT Short Term Goals - 03/13/18 1058      PT SHORT TERM GOAL #1   Title  Patient donnes prosthesis including tightening suspension strap with supervision. (All STGs Target Date 04/04/2018)    Time  4    Period  Weeks    Status  On-going    Target Date  04/04/18      PT SHORT TERM GOAL #2   Title  Patient tolerates daily prosthesis wear >6hrs total / day.     Time  4    Period  Weeks    Status  On-going    Target Date  04/04/18      PT SHORT TERM GOAL #3   Title  Patient sit to/from stand from chairs with armrests to rolling walker with prosthesis with supervision.     Time  4    Period  Weeks    Status  On-going    Target Date  04/04/18      PT SHORT TERM GOAL #4   Title  Patient ambulates 30' with rolling  walker & locked prosthesis with minA.     Time  4    Period  Weeks    Status  On-going    Target Date  04/04/18        PT Long Term Goals - 03/13/18 1058      PT LONG TERM GOAL #1   Title  Patient & sister verbalize & demonstrate understanding of proper prosthetic care to enable safe use of prosthesis.  (All LTG Target Dates:  05/30/2018)    Time  12    Period  Weeks    Status  On-going    Target Date  05/30/18      PT LONG TERM GOAL #2   Title  Patient tolerates wear of Transfemoral prosthesis >75% of awake hours without skin issues or limb pain.     Time  12    Period  Weeks    Status  On-going    Target Date  05/30/18      PT LONG TERM GOAL #3   Title  Standing balance with RW support: reaching 10" anteriorly, picks up object from floor, scans environment & manages clothes for toileting modified indepenent.     Time  12    Period  Weeks    Status  On-going    Target Date  05/30/18      PT LONG TERM GOAL #4   Title  Patient ambulates 200' with RW & prosthesis with supervision for limited community mobility.     Time  12    Period  Weeks    Status  On-going    Target Date  05/30/18      PT LONG TERM GOAL #5   Title  Patient ambulates 85' around furniture with RW & prosthesis modified independent for household mobility.     Time  12    Period  Weeks    Status  On-going    Target Date  05/30/18      PT LONG TERM GOAL #6   Title  Patient negotiates stairs with 2 rails, ramps & curbs with RW & prosthesis with minimal assist for community access.     Time  12    Period  Weeks    Status  On-going    Target Date  05/30/18            Plan - 03/24/18 1115    Clinical Impression Statement  Niece & Nephew appear to understand how to safely assist with stand pivot transfer with RW and limited (~10') gait with RW. Patient may have pubic pain due to limb sitting too deep in socket with volume change / shrinking. She verbalizes understanding of adjusting ply socks to  address issue.     Rehab Potential  Good    Clinical Impairments Affecting Rehab Potential  pt unable to read / write and sister who is caregiver reports limited ability to read / write    PT Frequency  3x / week 3x/wk for 4 weeks then 2x/wk for 8 weeks    PT Duration  12 weeks    PT Treatment/Interventions  ADLs/Self Care Home Management;DME Instruction;Gait training;Stair training;Functional mobility training;Therapeutic activities;Therapeutic exercise;Balance training;Neuromuscular re-education;Patient/family education;Prosthetic Training;Passive range of motion;Vestibular;Canalith Repostioning    PT Next Visit Plan  Instruct in car transfers with RW & prosthesis, review prosthetic care, work on sit to/from stand & stand-pivot transfers, prosthetic gait with RW, HEP at sink (open cabinet to enable sit down with locked prosthesis)    Consulted and Agree with Plan of Care  Patient;Family member/caregiver    Family Member Consulted  Niece, [name] & Nephew-in-law, [name]       Patient will benefit from skilled therapeutic intervention in order to improve the following deficits and impairments:  Abnormal gait, Decreased activity tolerance, Decreased balance, Decreased coordination, Decreased endurance, Decreased knowledge of use of DME, Decreased mobility, Decreased range of motion, Decreased scar mobility, Decreased strength, Impaired flexibility, Postural dysfunction, Obesity, Prosthetic Dependency  Visit Diagnosis: Muscle weakness (generalized)  Unsteadiness on feet  Other abnormalities of gait and mobility  Abnormal posture  Stiffness of left hip, not elsewhere classified     Problem List Patient Active Problem List   Diagnosis Date Noted  . Atherosclerosis of native arteries of the extremities with gangrene ([place]) 11/01/2017  . Unilateral AKA, left ([place])   . Post-operative pain   . OSA (obstructive sleep apnea)   . Benign essential HTN   . Diabetes mellitus type 2 in obese ([place])    . Hyperkalemia   . Leukocytosis   . Acute blood loss anemia   . Amputation stump infection ([place]) 08/23/2017  . Amputation of left lower extremity above knee upon examination ([place]) 08/23/2017  . PAD (peripheral artery disease) ([place]) 07/23/2017  . Cellulitis 06/10/2017  . Hyponatremia 06/10/2017  . Fever 06/10/2017  . Rash 05/24/2017  . Diabetes ([place])   . HTN (hypertension)   . Allergic rhinitis   . Insomnia   . Depression     Jondavid Schreier PT, DPT 03/24/2018, 11:36 AM  [facility] [address] [place], [place], [address] Phone: [phone]   Fax:  [phone]  Name: Danielle Buchanan MRN: [id number] Date of Birth: 1956/01/25

## 2018-03-27 ENCOUNTER — Ambulatory Visit: Payer: Medicare HMO | Admitting: Physical Therapy

## 2018-03-27 ENCOUNTER — Encounter: Payer: Self-pay | Admitting: Physical Therapy

## 2018-03-27 DIAGNOSIS — M6281 Muscle weakness (generalized): Secondary | ICD-10-CM

## 2018-03-27 DIAGNOSIS — R2681 Unsteadiness on feet: Secondary | ICD-10-CM

## 2018-03-27 DIAGNOSIS — M25652 Stiffness of left hip, not elsewhere classified: Secondary | ICD-10-CM | POA: Diagnosis not present

## 2018-03-27 DIAGNOSIS — R293 Abnormal posture: Secondary | ICD-10-CM | POA: Diagnosis not present

## 2018-03-27 DIAGNOSIS — R2689 Other abnormalities of gait and mobility: Secondary | ICD-10-CM | POA: Diagnosis not present

## 2018-03-28 ENCOUNTER — Ambulatory Visit: Payer: Medicare HMO | Admitting: Physical Therapy

## 2018-03-28 ENCOUNTER — Encounter: Payer: Self-pay | Admitting: Physical Therapy

## 2018-03-28 DIAGNOSIS — R2681 Unsteadiness on feet: Secondary | ICD-10-CM | POA: Diagnosis not present

## 2018-03-28 DIAGNOSIS — M6281 Muscle weakness (generalized): Secondary | ICD-10-CM | POA: Diagnosis not present

## 2018-03-28 DIAGNOSIS — M25652 Stiffness of left hip, not elsewhere classified: Secondary | ICD-10-CM | POA: Diagnosis not present

## 2018-03-28 DIAGNOSIS — R2689 Other abnormalities of gait and mobility: Secondary | ICD-10-CM

## 2018-03-28 DIAGNOSIS — R293 Abnormal posture: Secondary | ICD-10-CM

## 2018-03-28 NOTE — Patient Instructions (Signed)
Do each exercise 1-2 times per day Do each exercise 10 repetitions Hold each exercise for 3-5 seconds to feel your location  AT SINK FIND YOUR MIDLINE POSITION AND PLACE FEET EQUAL DISTANCE FROM THE MIDLINE.  USE TAPE ON FLOOR TO MARK THE MIDLINE POSITION. You also should try to feel with your limb pressure in socket.  You are trying to feel with limb what you used to feel with the bottom of your foot.  1. Side to Side Shift: Moving your hips only (not shoulders): move weight onto your left leg, HOLD/FEEL.  Move back to equal weight on each leg, HOLD/FEEL. Move weight onto your right leg, HOLD/FEEL. Move back to equal weight on each leg, HOLD/FEEL. Repeat. 2. Front to Back Shift: Moving your hips only (not shoulders): move your weight forward onto your toes, HOLD/FEEL. Move your weight back to equal Flat Foot on both legs, HOLD/FEEL. Move your weight back onto your heels, HOLD/FEEL. Move your weight back to equal on both legs, HOLD/FEEL. Repeat. 3. Moving Cones / Cups: With equal weight on each leg: Hold on with one hand the first time, then progress to no hand supports. Move cups from one side of sink to the other. Place cups ~2" out of your reach, progress to 10" beyond reach. 4. Overhead/Upward Reaching: alternated reaching up to top cabinets or ceiling if no cabinets present. Keep equal weight on each leg. Start with one hand support on counter while other hand reaches and progress to no hand support with reaching. 5.   Looking Over Shoulders: With equal weight on each leg: alternate turning to look over your shoulders with one hand support on counter as needed. Shift weight to side looking, pull hip then shoulder then head/eyes around to look behind you. Start with one hand support & progress to no hand support. 

## 2018-03-28 NOTE — Therapy (Signed)
[facility] [address] [place], [place], [address] Phone: [phone]   Fax:  [phone]  Physical Therapy Treatment  Patient Details  Name: Danielle Buchanan MRN: [id number] Date of Birth: 06-04-1956 Referring Provider: Maurice Small, MD   Encounter Date: 03/27/2018  PT End of Session - 03/27/18 1221    Visit Number  6    Number of Visits  29    Date for PT Re-Evaluation  06/03/18    Authorization Type  [employer]    Authorization Time Period  $3400 OOP max has been met prior to PT evaluation. Visit limit Medicare guidelines    PT Start Time  1020    PT Stop Time  1104    PT Time Calculation (min)  44 min    Equipment Utilized During Treatment  Gait belt    Activity Tolerance  Patient tolerated treatment well    Behavior During Therapy  WFL for tasks assessed/performed       Past Medical History:  Diagnosis Date  . Allergic rhinitis   . Depression   . Diabetes ([place])   . Dyslipidemia   . HTN (hypertension)   . Insomnia   . OSA (obstructive sleep apnea)    no cpap  . Urinary incontinence     Past Surgical History:  Procedure Laterality Date  . ABDOMINAL AORTOGRAM W/LOWER EXTREMITY N/A 06/24/2017   Procedure: ABDOMINAL AORTOGRAM W/LOWER EXTREMITY;  Surgeon: Waynetta Sandy, MD;  Location: [facility];  Service: Cardiovascular;  Laterality: N/A;  . ABDOMINAL AORTOGRAM W/LOWER EXTREMITY Right 10/09/2017   Procedure: ABDOMINAL AORTOGRAM W/LOWER EXTREMITY;  Surgeon: Waynetta Sandy, MD;  Location: [facility];  Service: Cardiovascular;  Laterality: Right;  . AMPUTATION Left 07/23/2017   Procedure: AMPUTATION  BELOW KNEE;  Surgeon: Waynetta Sandy, MD;  Location: [facility];  Service: Vascular;  Laterality: Left;  . AMPUTATION Left 08/12/2017   Procedure: REVISION BELOW KNEE;  Surgeon: Waynetta Sandy, MD;  Location: [facility];  Service: Vascular;  Laterality: Left;  .  AMPUTATION Left 08/27/2017   Procedure: left ABOVE KNEE AMPUTATION;  Surgeon: Waynetta Sandy, MD;  Location: [facility];  Service: Vascular;  Laterality: Left;  . AMPUTATION Left 08/30/2017   Procedure: REVISION AMPUTATION ABOVE KNEE;  Surgeon: Serafina Mitchell, MD;  Location: [facility];  Service: Vascular;  Laterality: Left;  . AMPUTATION Right 11/04/2017   Procedure: AMPUTATION RIGHT FOURTH TOE;  Surgeon: Waynetta Sandy, MD;  Location: [facility];  Service: Vascular;  Laterality: Right;  . APPLICATION OF WOUND VAC Left 08/27/2017   Procedure: APPLICATION OF WOUND VAC;  Surgeon: Waynetta Sandy, MD;  Location: [facility];  Service: Vascular;  Laterality: Left;  . CARPAL TUNNEL RELEASE Right   . COLONOSCOPY    . LOWER EXTREMITY INTERVENTION Right 10/09/2017   Procedure: LOWER EXTREMITY INTERVENTION;  Surgeon: Waynetta Sandy, MD;  Location: [facility];  Service: Cardiovascular;  Laterality: Right;    There were no vitals filed for this visit.    03/27/18 1015  Symptoms/Limitations  Subjective She is wearing prosthesis daily 3-5 hrs once per day. She transfered to toilet with niece assist.   Patient is accompained by: Family member (niece, [name] & nephew, [name])  Pertinent History left TFA, right 4th toe amputation, DM2, PAD, HTN, right carpal tunnel release,  Limitations Lifting;Standing;Walking;House hold activities  Patient Stated Goals Walk with prosthesis in home & in community  Pain Assessment  Currently in  Pain? No/denies         03/27/18 1015  Transfers  Transfers Sit to Stand;Stand to [employer] Transfers  Sit to Stand 4: Min guard;5: Supervision;With upper extremity assist;With armrests;From chair/3-in-1 (chairs with armrests SBA & without armrests min guard to RW)  Sit to Stand Details Verbal cues for technique;Visual cues for safe use of DME/AE;Verbal cues for safe use of DME/AE;Verbal cues for precautions/safety  Stand to Sit 5:  Supervision;4: Min guard;With upper extremity assist;With armrests;To chair/3-in-1 (chairs with armrests SBA & without armrests min guard to RW)  Stand to Sit Details (indicate cue type and reason) Visual cues for safe use of DME/AE;Verbal cues for safe use of DME/AE;Verbal cues for precautions/safety;Verbal cues for technique  Stand Pivot Transfers 4: Min assist;With armrests (with RW & locked prosthesis w/c to/from car SUV high seat)  Stand Pivot Transfer Details (indicate cue type and reason) PT demo, verbal cues car transfer with RW & locked prosthesis. Pt return demo understanding with minA. Niece & pt verbalized understanding.   Ambulation/Gait  Ambulation/Gait Yes  Ambulation/Gait Assistance 4: Min assist  Ambulation/Gait Assistance Details PT manual & verbal cues on pelvic weight shift over prosthesis in stance, sequence & upright posture.  PT cued neice how to assist gait.   Ambulation Distance (Feet) 21 Feet (21' X 2 & turn 90* to position to sit)  Assistive device Rolling walker;Prosthesis  Gait Pattern Step-to pattern;Decreased step length - right;Decreased stance time - left;Decreased stride length;Decreased weight shift to left;Left circumduction;Left hip hike;Antalgic;Lateral hip instability;Trunk flexed;Abducted - left;Poor foot clearance - left;Poor foot clearance - right  Ambulation Surface Level;Indoor  Prosthetics  Prosthetic Care Comments  sitting posture with TFA prosthesis to level pelvis; adjusting ply socks & donning socks;  lock mechanism in locked position,  need left leg rests on w/c for safety when transporting with prosthesis on limb, proper method for tightening suspension strap standing  Current prosthetic wear tolerance (days/week)  daily  Current prosthetic wear tolerance (#hours/day)  2-3 hrs 2x/day  Residual limb condition  intact with no issues.   Education Provided Skin check;Residual limb care;Prosthetic cleaning;Ply sock cleaning;Proper Donning;Proper  Doffing;Proper wear schedule/adjustment;Other (comment) (see prosthetic care comments)  Person(s) Educated Patient;Caregiver(s)  Education Method Explanation;Demonstration;Tactile cues;Verbal cues  Education Method Verbalized understanding;Returned demonstration;Tactile cues required;Verbal cues required;Needs further instruction  Donning Prosthesis 4  Doffing Prosthesis 5                          PT Short Term Goals - 03/13/18 1058      PT SHORT TERM GOAL #1   Title  Patient donnes prosthesis including tightening suspension strap with supervision. (All STGs Target Date 04/04/2018)    Time  4    Period  Weeks    Status  On-going    Target Date  04/04/18      PT SHORT TERM GOAL #2   Title  Patient tolerates daily prosthesis wear >6hrs total / day.     Time  4    Period  Weeks    Status  On-going    Target Date  04/04/18      PT SHORT TERM GOAL #3   Title  Patient sit to/from stand from chairs with armrests to rolling walker with prosthesis with supervision.     Time  4    Period  Weeks    Status  On-going    Target Date  04/04/18      PT  SHORT TERM GOAL #4   Title  Patient ambulates 72' with rolling walker & locked prosthesis with minA.     Time  4    Period  Weeks    Status  On-going    Target Date  04/04/18        PT Long Term Goals - 03/13/18 1058      PT LONG TERM GOAL #1   Title  Patient & sister verbalize & demonstrate understanding of proper prosthetic care to enable safe use of prosthesis.  (All LTG Target Dates:  05/30/2018)    Time  12    Period  Weeks    Status  On-going    Target Date  05/30/18      PT LONG TERM GOAL #2   Title  Patient tolerates wear of Transfemoral prosthesis >75% of awake hours without skin issues or limb pain.     Time  12    Period  Weeks    Status  On-going    Target Date  05/30/18      PT LONG TERM GOAL #3   Title  Standing balance with RW support: reaching 10" anteriorly, picks up object from floor,  scans environment & manages clothes for toileting modified indepenent.     Time  12    Period  Weeks    Status  On-going    Target Date  05/30/18      PT LONG TERM GOAL #4   Title  Patient ambulates 200' with RW & prosthesis with supervision for limited community mobility.     Time  12    Period  Weeks    Status  On-going    Target Date  05/30/18      PT LONG TERM GOAL #5   Title  Patient ambulates 73' around furniture with RW & prosthesis modified independent for household mobility.     Time  12    Period  Weeks    Status  On-going    Target Date  05/30/18      PT LONG TERM GOAL #6   Title  Patient negotiates stairs with 2 rails, ramps & curbs with RW & prosthesis with minimal assist for community access.     Time  12    Period  Weeks    Status  On-going    Target Date  05/30/18            Plan - 03/27/18 1535    Clinical Impression Statement  Niece appears to understand car transfers with RW & locked prosthesis. Niece also seems to understand how to assist prosthetic gait with RW limited distances ~10'.     Rehab Potential  Good    Clinical Impairments Affecting Rehab Potential  pt unable to read / write and sister who is caregiver reports limited ability to read / write    PT Frequency  3x / week 3x/wk for 4 weeks then 2x/wk for 8 weeks    PT Duration  12 weeks    PT Treatment/Interventions  ADLs/Self Care Home Management;DME Instruction;Gait training;Stair training;Functional mobility training;Therapeutic activities;Therapeutic exercise;Balance training;Neuromuscular re-education;Patient/family education;Prosthetic Training;Passive range of motion;Vestibular;Canalith Repostioning    PT Next Visit Plan  review prosthetic care, work on sit to/from stand & stand-pivot transfers, prosthetic gait with RW, HEP at sink (open cabinet to enable sit down with locked prosthesis)    Consulted and Agree with Plan of Care  Patient;Family member/caregiver    Family Member Consulted   Niece, [name] &  Nephew-in-law, Greg       Patient will benefit from skilled therapeutic intervention in order to improve the following deficits and impairments:  Abnormal gait, Decreased activity tolerance, Decreased balance, Decreased coordination, Decreased endurance, Decreased knowledge of use of DME, Decreased mobility, Decreased range of motion, Decreased scar mobility, Decreased strength, Impaired flexibility, Postural dysfunction, Obesity, Prosthetic Dependency  Visit Diagnosis: Muscle weakness (generalized)  Unsteadiness on feet  Other abnormalities of gait and mobility  Abnormal posture     Problem List Patient Active Problem List   Diagnosis Date Noted  . Atherosclerosis of native arteries of the extremities with gangrene ([place]) 11/01/2017  . Unilateral AKA, left ([place])   . Post-operative pain   . OSA (obstructive sleep apnea)   . Benign essential HTN   . Diabetes mellitus type 2 in obese ([place])   . Hyperkalemia   . Leukocytosis   . Acute blood loss anemia   . Amputation stump infection ([place]) 08/23/2017  . Amputation of left lower extremity above knee upon examination ([place]) 08/23/2017  . PAD (peripheral artery disease) ([place]) 07/23/2017  . Cellulitis 06/10/2017  . Hyponatremia 06/10/2017  . Fever 06/10/2017  . Rash 05/24/2017  . Diabetes ([place])   . HTN (hypertension)   . Allergic rhinitis   . Insomnia   . Depression     Fredis Malkiewicz  PT, DPT 03/28/2018, 3:37 PM  [facility] [address] [place], [place], [address] Phone: [phone]   Fax:  [phone]  Name: Jearldine Cassady MRN: [id number] Date of Birth: 06-01-1956

## 2018-03-29 NOTE — Therapy (Signed)
[facility] [address] [place], [place], [address] Phone: [phone]   Fax:  [phone]  Physical Therapy Treatment  Patient Details  Name: Danielle Buchanan MRN: [id number] Date of Birth: Oct 24, 1955 Referring Provider: Maurice Small, MD   Encounter Date: 03/28/2018  PT End of Session - 03/28/18 1157    Visit Number  7    Number of Visits  29    Date for PT Re-Evaluation  06/03/18    Authorization Type  [employer]    Authorization Time Period  $3400 OOP max has been met prior to PT evaluation. Visit limit Medicare guidelines    PT Start Time  1017    PT Stop Time  1100    PT Time Calculation (min)  43 min    Equipment Utilized During Treatment  Gait belt    Activity Tolerance  Patient tolerated treatment well;No increased pain    Behavior During Therapy  WFL for tasks assessed/performed       Past Medical History:  Diagnosis Date  . Allergic rhinitis   . Depression   . Diabetes ([place])   . Dyslipidemia   . HTN (hypertension)   . Insomnia   . OSA (obstructive sleep apnea)    no cpap  . Urinary incontinence     Past Surgical History:  Procedure Laterality Date  . ABDOMINAL AORTOGRAM W/LOWER EXTREMITY N/A 06/24/2017   Procedure: ABDOMINAL AORTOGRAM W/LOWER EXTREMITY;  Surgeon: Waynetta Sandy, MD;  Location: [facility];  Service: Cardiovascular;  Laterality: N/A;  . ABDOMINAL AORTOGRAM W/LOWER EXTREMITY Right 10/09/2017   Procedure: ABDOMINAL AORTOGRAM W/LOWER EXTREMITY;  Surgeon: Waynetta Sandy, MD;  Location: [facility];  Service: Cardiovascular;  Laterality: Right;  . AMPUTATION Left 07/23/2017   Procedure: AMPUTATION  BELOW KNEE;  Surgeon: Waynetta Sandy, MD;  Location: [facility];  Service: Vascular;  Laterality: Left;  . AMPUTATION Left 08/12/2017   Procedure: REVISION BELOW KNEE;  Surgeon: Waynetta Sandy, MD;  Location: [facility];  Service: Vascular;   Laterality: Left;  . AMPUTATION Left 08/27/2017   Procedure: left ABOVE KNEE AMPUTATION;  Surgeon: Waynetta Sandy, MD;  Location: [facility];  Service: Vascular;  Laterality: Left;  . AMPUTATION Left 08/30/2017   Procedure: REVISION AMPUTATION ABOVE KNEE;  Surgeon: Serafina Mitchell, MD;  Location: [facility];  Service: Vascular;  Laterality: Left;  . AMPUTATION Right 11/04/2017   Procedure: AMPUTATION RIGHT FOURTH TOE;  Surgeon: Waynetta Sandy, MD;  Location: [facility];  Service: Vascular;  Laterality: Right;  . APPLICATION OF WOUND VAC Left 08/27/2017   Procedure: APPLICATION OF WOUND VAC;  Surgeon: Waynetta Sandy, MD;  Location: [facility];  Service: Vascular;  Laterality: Left;  . CARPAL TUNNEL RELEASE Right   . COLONOSCOPY    . LOWER EXTREMITY INTERVENTION Right 10/09/2017   Procedure: LOWER EXTREMITY INTERVENTION;  Surgeon: Waynetta Sandy, MD;  Location: [facility];  Service: Cardiovascular;  Laterality: Right;    There were no vitals filed for this visit.  Subjective Assessment - 03/28/18 1022    Subjective  No falls. Does report right knee and foot pain and that her right knee wants to buckle sometimes. Notices it most with transfering and with static standing. Has not noticed it wanting to buckle with gait at home with neice walking to/from bathroom.     Patient is accompained by:  Family member niece, [name]    Pertinent History  left TFA, right  4th toe amputation, DM2, PAD, HTN, right carpal tunnel release,    Limitations  Lifting;Standing;Walking;House hold activities    Patient Stated Goals  Walk with prosthesis in home & in community    Currently in Pain?  Yes          [facility] Adult PT Treatment/Exercise - 03/28/18 1025      Transfers   Transfers  Sit to Stand;Stand to Sit    Sit to Stand  4: Min guard;5: Supervision;With upper extremity assist;With armrests;From chair/3-in-1    Stand to Sit  5: Supervision;4: Min guard;With upper extremity  assist;With armrests;To chair/3-in-1      Neuro Re-ed    Neuro Re-ed Details   educated pt and niece on sink HEP for balance and proprioception. Handout provided.       Prosthetics   Current prosthetic wear tolerance (days/week)   daily    Current prosthetic wear tolerance (#hours/day)   goal is 6 hours total before neice has to go to work     Residual limb condition   intact with no issues.     Education Provided  Correct ply sock adjustment;Proper wear schedule/adjustment;Proper weight-bearing schedule/adjustment;Proper Donning;Proper Doffing;Residual limb care sink HEP    Person(s) Educated  Patient;Caregiver(s)    Education Method  Explanation;Demonstration;Verbal cues;Handout    Education Method  Verbalized understanding;Returned demonstration;Verbal cues required;Needs further instruction    Donning Prosthesis  Minimal assist    Doffing Prosthesis  Supervision             PT Education - 03/28/18 1244    Education Details  sink HEP    Person(s) Educated  Associate Professor) niece    Methods  Demonstration;Explanation;Verbal cues;Handout handout for caregivers as [name] was not present today    Comprehension  Verbalized understanding;Returned demonstration;Verbal cues required;Need further instruction       PT Short Term Goals - 03/13/18 1058      PT SHORT TERM GOAL #1   Title  Patient donnes prosthesis including tightening suspension strap with supervision. (All STGs Target Date 04/04/2018)    Time  4    Period  Weeks    Status  On-going    Target Date  04/04/18      PT SHORT TERM GOAL #2   Title  Patient tolerates daily prosthesis wear >6hrs total / day.     Time  4    Period  Weeks    Status  On-going    Target Date  04/04/18      PT SHORT TERM GOAL #3   Title  Patient sit to/from stand from chairs with armrests to rolling walker with prosthesis with supervision.     Time  4    Period  Weeks    Status  On-going    Target Date  04/04/18      PT SHORT TERM  GOAL #4   Title  Patient ambulates 30' with rolling walker & locked prosthesis with minA.     Time  4    Period  Weeks    Status  On-going    Target Date  04/04/18        PT Long Term Goals - 03/13/18 1058      PT LONG TERM GOAL #1   Title  Patient & sister verbalize & demonstrate understanding of proper prosthetic care to enable safe use of prosthesis.  (All LTG Target Dates:  05/30/2018)    Time  12    Period  Weeks    Status  On-going    Target Date  05/30/18      PT LONG TERM GOAL #2   Title  Patient tolerates wear of Transfemoral prosthesis >75% of awake hours without skin issues or limb pain.     Time  12    Period  Weeks    Status  On-going    Target Date  05/30/18      PT LONG TERM GOAL #3   Title  Standing balance with RW support: reaching 10" anteriorly, picks up object from floor, scans environment & manages clothes for toileting modified indepenent.     Time  12    Period  Weeks    Status  On-going    Target Date  05/30/18      PT LONG TERM GOAL #4   Title  Patient ambulates 200' with RW & prosthesis with supervision for limited community mobility.     Time  12    Period  Weeks    Status  On-going    Target Date  05/30/18      PT LONG TERM GOAL #5   Title  Patient ambulates 57' around furniture with RW & prosthesis modified independent for household mobility.     Time  12    Period  Weeks    Status  On-going    Target Date  05/30/18      PT LONG TERM GOAL #6   Title  Patient negotiates stairs with 2 rails, ramps & curbs with RW & prosthesis with minimal assist for community access.     Time  12    Period  Weeks    Status  On-going    Target Date  05/30/18            Plan - 03/28/18 1242    Clinical Impression Statement  Today's skilled session focused on sit<>stand transfers at the sink and education provided on sink HEP to address balance/proprioception. Pt safe to perform at home with supervision at this time. Continued to provide education  on sock ply management as well with sock ply increased for comfort during session today. Pt is progressing toward goals and should benefit from continued PT to progress toward unmet goals.     Rehab Potential  Good    Clinical Impairments Affecting Rehab Potential  pt unable to read / write and sister who is caregiver reports limited ability to read / write    PT Frequency  3x / week 3x/wk for 4 weeks then 2x/wk for 8 weeks    PT Duration  12 weeks    PT Treatment/Interventions  ADLs/Self Care Home Management;DME Instruction;Gait training;Stair training;Functional mobility training;Therapeutic activities;Therapeutic exercise;Balance training;Neuromuscular re-education;Patient/family education;Prosthetic Training;Passive range of motion;Vestibular;Canalith Repostioning    PT Next Visit Plan  review prosthetic care, work on sit to/from stand & stand-pivot transfers, prosthetic gait with RW    Consulted and Agree with Plan of Care  Patient;Family member/caregiver    Family Member Consulted  Niece, [name]        Patient will benefit from skilled therapeutic intervention in order to improve the following deficits and impairments:  Abnormal gait, Decreased activity tolerance, Decreased balance, Decreased coordination, Decreased endurance, Decreased knowledge of use of DME, Decreased mobility, Decreased range of motion, Decreased scar mobility, Decreased strength, Impaired flexibility, Postural dysfunction, Obesity, Prosthetic Dependency  Visit Diagnosis: Muscle weakness (generalized)  Unsteadiness on feet  Other abnormalities of gait and mobility  Abnormal posture     Problem List Patient Active Problem List  Diagnosis Date Noted  . Atherosclerosis of native arteries of the extremities with gangrene ([place]) 11/01/2017  . Unilateral AKA, left ([place])   . Post-operative pain   . OSA (obstructive sleep apnea)   . Benign essential HTN   . Diabetes mellitus type 2 in obese ([place])   . Hyperkalemia    . Leukocytosis   . Acute blood loss anemia   . Amputation stump infection ([place]) 08/23/2017  . Amputation of left lower extremity above knee upon examination ([place]) 08/23/2017  . PAD (peripheral artery disease) ([place]) 07/23/2017  . Cellulitis 06/10/2017  . Hyponatremia 06/10/2017  . Fever 06/10/2017  . Rash 05/24/2017  . Diabetes ([place])   . HTN (hypertension)   . Allergic rhinitis   . Insomnia   . Depression     [place], [place], [facility] [address] [place], [place] [address] [phone] 03/29/18, 12:45 PM   Name: Danielle Buchanan MRN: [id number] Date of Birth: 1956-04-08

## 2018-03-31 ENCOUNTER — Encounter: Payer: Self-pay | Admitting: Physical Therapy

## 2018-03-31 ENCOUNTER — Ambulatory Visit: Payer: Medicare HMO | Admitting: Physical Therapy

## 2018-03-31 DIAGNOSIS — R2689 Other abnormalities of gait and mobility: Secondary | ICD-10-CM

## 2018-03-31 DIAGNOSIS — R2681 Unsteadiness on feet: Secondary | ICD-10-CM

## 2018-03-31 DIAGNOSIS — M25652 Stiffness of left hip, not elsewhere classified: Secondary | ICD-10-CM | POA: Diagnosis not present

## 2018-03-31 DIAGNOSIS — R293 Abnormal posture: Secondary | ICD-10-CM

## 2018-03-31 DIAGNOSIS — M6281 Muscle weakness (generalized): Secondary | ICD-10-CM

## 2018-04-01 NOTE — Therapy (Signed)
[facility] [address] [place], [place], [address] Phone: [phone]   Fax:  [phone]  Physical Therapy Treatment  Patient Details  Name: Danielle Buchanan MRN: [id number] Date of Birth: Jan 06, 1956 Referring Provider: Maurice Small, MD   Encounter Date: 03/31/2018  PT End of Session - 03/31/18 1231    Visit Number  8    Number of Visits  29    Date for PT Re-Evaluation  06/03/18    Authorization Type  [employer]    Authorization Time Period  $3400 OOP max has been met prior to PT evaluation. Visit limit Medicare guidelines    PT Start Time  7124    PT Stop Time  1103    PT Time Calculation (min)  40 min    Equipment Utilized During Treatment  Gait belt    Activity Tolerance  Patient tolerated treatment well;No increased pain    Behavior During Therapy  WFL for tasks assessed/performed       Past Medical History:  Diagnosis Date  . Allergic rhinitis   . Depression   . Diabetes ([place])   . Dyslipidemia   . HTN (hypertension)   . Insomnia   . OSA (obstructive sleep apnea)    no cpap  . Urinary incontinence     Past Surgical History:  Procedure Laterality Date  . ABDOMINAL AORTOGRAM W/LOWER EXTREMITY N/A 06/24/2017   Procedure: ABDOMINAL AORTOGRAM W/LOWER EXTREMITY;  Surgeon: Waynetta Sandy, MD;  Location: [facility];  Service: Cardiovascular;  Laterality: N/A;  . ABDOMINAL AORTOGRAM W/LOWER EXTREMITY Right 10/09/2017   Procedure: ABDOMINAL AORTOGRAM W/LOWER EXTREMITY;  Surgeon: Waynetta Sandy, MD;  Location: [facility];  Service: Cardiovascular;  Laterality: Right;  . AMPUTATION Left 07/23/2017   Procedure: AMPUTATION  BELOW KNEE;  Surgeon: Waynetta Sandy, MD;  Location: [facility];  Service: Vascular;  Laterality: Left;  . AMPUTATION Left 08/12/2017   Procedure: REVISION BELOW KNEE;  Surgeon: Waynetta Sandy, MD;  Location: [facility];  Service: Vascular;   Laterality: Left;  . AMPUTATION Left 08/27/2017   Procedure: left ABOVE KNEE AMPUTATION;  Surgeon: Waynetta Sandy, MD;  Location: [facility];  Service: Vascular;  Laterality: Left;  . AMPUTATION Left 08/30/2017   Procedure: REVISION AMPUTATION ABOVE KNEE;  Surgeon: Serafina Mitchell, MD;  Location: [facility];  Service: Vascular;  Laterality: Left;  . AMPUTATION Right 11/04/2017   Procedure: AMPUTATION RIGHT FOURTH TOE;  Surgeon: Waynetta Sandy, MD;  Location: [facility];  Service: Vascular;  Laterality: Right;  . APPLICATION OF WOUND VAC Left 08/27/2017   Procedure: APPLICATION OF WOUND VAC;  Surgeon: Waynetta Sandy, MD;  Location: [facility];  Service: Vascular;  Laterality: Left;  . CARPAL TUNNEL RELEASE Right   . COLONOSCOPY    . LOWER EXTREMITY INTERVENTION Right 10/09/2017   Procedure: LOWER EXTREMITY INTERVENTION;  Surgeon: Waynetta Sandy, MD;  Location: [facility];  Service: Cardiovascular;  Laterality: Right;    There were no vitals filed for this visit.  Subjective Assessment - 03/31/18 1023    Subjective  right knee is sore & feels like going to give out a couple of times. Niece reports assistance needed for donning.     Patient is accompained by:  Family member niece, [name]    Pertinent History  left TFA, right 4th toe amputation, DM2, PAD, HTN, right carpal tunnel release,    Limitations  Lifting;Standing;Walking;House hold activities    Patient  Stated Goals  Walk with prosthesis in home & in community    Currently in Pain?  Yes    Pain Score  3     Pain Location  Knee    Pain Orientation  Right    Pain Descriptors / Indicators  Aching;Sore    Pain Onset  In the past 7 days    Pain Frequency  Intermittent    Aggravating Factors   standing & walking    Pain Relieving Factors  sitting to rest                       [facility] Adult PT Treatment/Exercise - 03/31/18 1025      Transfers   Transfers  Sit to Stand;Stand to Sit    Sit to  Stand  5: Supervision;4: Min assist;4: Min guard;With upper extremity assist;With armrests;From chair/3-in-1 to RW, minA from chair without armrests with UE assist    Sit to Stand Details  Visual cues/gestures for sequencing;Verbal cues for sequencing;Verbal cues for technique    Sit to Stand Details (indicate cue type and reason)  PT demo, instructed in technique for chairs without armrests with UE assist    Stand to Sit  5: Supervision;4: Min guard;With upper extremity assist;With armrests;To chair/3-in-1 from [place], Min guard chairs without armrests    Stand to Sit Details (indicate cue type and reason)  Visual cues/gestures for sequencing;Verbal cues for sequencing;Verbal cues for technique    Stand to Sit Details  PT demo, instructed in technique for chairs without armrests with UE assist    Comments  PT discussed raised toilet seat for ease of sitting vs balance / stability with sitting on toilet. Can use yoga or light wt box to support prosthesis. Niece verbalized understanding      Ambulation/Gait   Ambulation/Gait  Yes    Ambulation/Gait Assistance  4: Min assist    Ambulation/Gait Assistance Details  verbal & tactile cues on wt shift over prosthesis in stance, sequence & turning to position to sit.     Ambulation Distance (Feet)  10 Feet 10' X 2    Assistive device  Rolling walker;Prosthesis    Gait Pattern  Step-to pattern;Decreased step length - right;Decreased stance time - left;Decreased stride length;Decreased weight shift to left;Left circumduction;Left hip hike;Antalgic;Lateral hip instability;Trunk flexed;Abducted - left;Poor foot clearance - left;Poor foot clearance - right    Ambulation Surface  Indoor;Level      Prosthetics   Prosthetic Care Comments   Pt donning with verbal cues for each portion. Pt able to perform 95% with increased time and cues. Niece & pt verbalize need for patient to perform at home even if increases amount of time.   Increase wear to 4 hrs 2x/day but remove  if need to transfer to toilet & redonne.    Current prosthetic wear tolerance (days/week)   daily    Current prosthetic wear tolerance (#hours/day)   wear 2-3 hrs limited by inability to donne & niece/nephew's schedule when avaiilable to help her.     Residual limb condition   intact with no issues.     Education Provided  Correct ply sock adjustment;Proper wear schedule/adjustment;Proper Donning;Proper Doffing;Residual limb care;Other (comment) see prosthetic care comments    Person(s) Educated  Patient;Caregiver(s);Other (comment) neice    Education Method  Explanation;Demonstration;Tactile cues;Verbal cues    Education Method  Verbalized understanding;Returned demonstration;Tactile cues required;Verbal cues required;Needs further instruction    Donning Prosthesis  Minimal assist  PT Short Term Goals - 03/13/18 1058      PT SHORT TERM GOAL #1   Title  Patient donnes prosthesis including tightening suspension strap with supervision. (All STGs Target Date 04/04/2018)    Time  4    Period  Weeks    Status  On-going    Target Date  04/04/18      PT SHORT TERM GOAL #2   Title  Patient tolerates daily prosthesis wear >6hrs total / day.     Time  4    Period  Weeks    Status  On-going    Target Date  04/04/18      PT SHORT TERM GOAL #3   Title  Patient sit to/from stand from chairs with armrests to rolling walker with prosthesis with supervision.     Time  4    Period  Weeks    Status  On-going    Target Date  04/04/18      PT SHORT TERM GOAL #4   Title  Patient ambulates 30' with rolling walker & locked prosthesis with minA.     Time  4    Period  Weeks    Status  On-going    Target Date  04/04/18        PT Long Term Goals - 03/13/18 1058      PT LONG TERM GOAL #1   Title  Patient & sister verbalize & demonstrate understanding of proper prosthetic care to enable safe use of prosthesis.  (All LTG Target Dates:  05/30/2018)    Time  12    Period  Weeks     Status  On-going    Target Date  05/30/18      PT LONG TERM GOAL #2   Title  Patient tolerates wear of Transfemoral prosthesis >75% of awake hours without skin issues or limb pain.     Time  12    Period  Weeks    Status  On-going    Target Date  05/30/18      PT LONG TERM GOAL #3   Title  Standing balance with RW support: reaching 10" anteriorly, picks up object from floor, scans environment & manages clothes for toileting modified indepenent.     Time  12    Period  Weeks    Status  On-going    Target Date  05/30/18      PT LONG TERM GOAL #4   Title  Patient ambulates 200' with RW & prosthesis with supervision for limited community mobility.     Time  12    Period  Weeks    Status  On-going    Target Date  05/30/18      PT LONG TERM GOAL #5   Title  Patient ambulates 39' around furniture with RW & prosthesis modified independent for household mobility.     Time  12    Period  Weeks    Status  On-going    Target Date  05/30/18      PT LONG TERM GOAL #6   Title  Patient negotiates stairs with 2 rails, ramps & curbs with RW & prosthesis with minimal assist for community access.     Time  12    Period  Weeks    Status  On-going    Target Date  05/30/18            Plan - 03/31/18 1704    Clinical Impression Statement  Patient  was able to perform 95% of donning prosthesis including tightening strap in standing with constant verbal cues. Pt & niece seem to understand need for consistent wear for skin & pressure tolerance.     Rehab Potential  Good    Clinical Impairments Affecting Rehab Potential  pt unable to read / write and sister who is caregiver reports limited ability to read / write    PT Frequency  3x / week 3x/wk for 4 weeks then 2x/wk for 8 weeks    PT Duration  12 weeks    PT Treatment/Interventions  ADLs/Self Care Home Management;DME Instruction;Gait training;Stair training;Functional mobility training;Therapeutic activities;Therapeutic exercise;Balance  training;Neuromuscular re-education;Patient/family education;Prosthetic Training;Passive range of motion;Vestibular;Canalith Repostioning    PT Next Visit Plan  check STGs, per initial POC frequency 2x/wk starting next week,  review prosthetic care, work on sit to/from stand & stand-pivot transfers, prosthetic gait with RW    Consulted and Agree with Plan of Care  Patient;Family member/caregiver    Family Member Consulted  Niece, [name]        Patient will benefit from skilled therapeutic intervention in order to improve the following deficits and impairments:  Abnormal gait, Decreased activity tolerance, Decreased balance, Decreased coordination, Decreased endurance, Decreased knowledge of use of DME, Decreased mobility, Decreased range of motion, Decreased scar mobility, Decreased strength, Impaired flexibility, Postural dysfunction, Obesity, Prosthetic Dependency  Visit Diagnosis: Muscle weakness (generalized)  Unsteadiness on feet  Other abnormalities of gait and mobility  Abnormal posture     Problem List Patient Active Problem List   Diagnosis Date Noted  . Atherosclerosis of native arteries of the extremities with gangrene ([place]) 11/01/2017  . Unilateral AKA, left ([place])   . Post-operative pain   . OSA (obstructive sleep apnea)   . Benign essential HTN   . Diabetes mellitus type 2 in obese ([place])   . Hyperkalemia   . Leukocytosis   . Acute blood loss anemia   . Amputation stump infection ([facility]) 08/23/2017  . Amputation of left lower extremity above knee upon examination ([place]) 08/23/2017  . PAD (peripheral artery disease) ([place]) 07/23/2017  . Cellulitis 06/10/2017  . Hyponatremia 06/10/2017  . Fever 06/10/2017  . Rash 05/24/2017  . Diabetes ([place])   . HTN (hypertension)   . Allergic rhinitis   . Insomnia   . Depression     , PT, DPT 04/01/2018, 7:09 AM  [facility] [address] [place],  [place], [address] Phone: [phone]   Fax:  [phone]  Name: Danielle Buchanan MRN: [id number] Date of Birth: 17-Dec-1955

## 2018-04-02 ENCOUNTER — Encounter: Payer: Self-pay | Admitting: Physical Therapy

## 2018-04-02 ENCOUNTER — Ambulatory Visit: Payer: Medicare HMO | Admitting: Physical Therapy

## 2018-04-02 DIAGNOSIS — R293 Abnormal posture: Secondary | ICD-10-CM

## 2018-04-02 DIAGNOSIS — R2681 Unsteadiness on feet: Secondary | ICD-10-CM

## 2018-04-02 DIAGNOSIS — M6281 Muscle weakness (generalized): Secondary | ICD-10-CM

## 2018-04-02 DIAGNOSIS — R2689 Other abnormalities of gait and mobility: Secondary | ICD-10-CM | POA: Diagnosis not present

## 2018-04-02 DIAGNOSIS — M25652 Stiffness of left hip, not elsewhere classified: Secondary | ICD-10-CM | POA: Diagnosis not present

## 2018-04-02 NOTE — Therapy (Signed)
[facility] [address] [place], [place], [address] Phone: [phone]   Fax:  [phone]  Physical Therapy Treatment  Patient Details  Name: Danielle Buchanan MRN: [id number] Date of Birth: 10-16-1955 Referring Provider: Maurice Small, MD   Encounter Date: 04/02/2018  PT End of Session - 04/02/18 1340    Visit Number  9    Number of Visits  29    Date for PT Re-Evaluation  06/03/18    Authorization Type  [employer]    Authorization Time Period  $3400 OOP max has been met prior to PT evaluation. Visit limit Medicare guidelines    PT Start Time  1020    PT Stop Time  1108    PT Time Calculation (min)  48 min    Equipment Utilized During Treatment  Gait belt    Activity Tolerance  Patient tolerated treatment well;No increased pain    Behavior During Therapy  WFL for tasks assessed/performed       Past Medical History:  Diagnosis Date  . Allergic rhinitis   . Depression   . Diabetes ([place])   . Dyslipidemia   . HTN (hypertension)   . Insomnia   . OSA (obstructive sleep apnea)    no cpap  . Urinary incontinence     Past Surgical History:  Procedure Laterality Date  . ABDOMINAL AORTOGRAM W/LOWER EXTREMITY N/A 06/24/2017   Procedure: ABDOMINAL AORTOGRAM W/LOWER EXTREMITY;  Surgeon: Waynetta Sandy, MD;  Location: [facility];  Service: Cardiovascular;  Laterality: N/A;  . ABDOMINAL AORTOGRAM W/LOWER EXTREMITY Right 10/09/2017   Procedure: ABDOMINAL AORTOGRAM W/LOWER EXTREMITY;  Surgeon: Waynetta Sandy, MD;  Location: [facility];  Service: Cardiovascular;  Laterality: Right;  . AMPUTATION Left 07/23/2017   Procedure: AMPUTATION  BELOW KNEE;  Surgeon: Waynetta Sandy, MD;  Location: [facility];  Service: Vascular;  Laterality: Left;  . AMPUTATION Left 08/12/2017   Procedure: REVISION BELOW KNEE;  Surgeon: Waynetta Sandy, MD;  Location: [facility];  Service: Vascular;   Laterality: Left;  . AMPUTATION Left 08/27/2017   Procedure: left ABOVE KNEE AMPUTATION;  Surgeon: Waynetta Sandy, MD;  Location: [facility];  Service: Vascular;  Laterality: Left;  . AMPUTATION Left 08/30/2017   Procedure: REVISION AMPUTATION ABOVE KNEE;  Surgeon: Serafina Mitchell, MD;  Location: [facility];  Service: Vascular;  Laterality: Left;  . AMPUTATION Right 11/04/2017   Procedure: AMPUTATION RIGHT FOURTH TOE;  Surgeon: Waynetta Sandy, MD;  Location: [facility];  Service: Vascular;  Laterality: Right;  . APPLICATION OF WOUND VAC Left 08/27/2017   Procedure: APPLICATION OF WOUND VAC;  Surgeon: Waynetta Sandy, MD;  Location: [facility];  Service: Vascular;  Laterality: Left;  . CARPAL TUNNEL RELEASE Right   . COLONOSCOPY    . LOWER EXTREMITY INTERVENTION Right 10/09/2017   Procedure: LOWER EXTREMITY INTERVENTION;  Surgeon: Waynetta Sandy, MD;  Location: [facility];  Service: Cardiovascular;  Laterality: Right;    There were no vitals filed for this visit.  Subjective Assessment - 04/02/18 1031    Subjective  Pt put powder on residual limb to aid with donning sleeve.    Patient is accompained by:  Family member niece, Danielle Buchanan    Pertinent History  left TFA, right 4th toe amputation, DM2, PAD, HTN, right carpal tunnel release,    Limitations  Lifting;Standing;Walking;House hold activities    Patient Stated Goals  Walk with prosthesis in home & in community  Currently in Pain?  No/denies    Pain Score  8     Pain Location  Knee    Pain Orientation  Right    Pain Descriptors / Indicators  Aching;Sore    Pain Onset  In the past 7 days    Pain Frequency  Intermittent                       [facility] Adult PT Treatment/Exercise - 04/02/18 0001      Transfers   Transfers  Sit to Stand;Stand to Sit    Sit to Stand  4: Min assist;4: Min guard;With upper extremity assist;With armrests;From chair/3-in-1    Sit to Stand Details  Visual  cues/gestures for sequencing;Verbal cues for sequencing;Verbal cues for technique    Sit to Stand Details (indicate cue type and reason)   verbal cues for locking prosthetic knee; weight shifting forward to stand                               Stand to Sit  4: Min guard;4: Min assist    Stand to Sit Details (indicate cue type and reason)  Visual cues/gestures for sequencing;Verbal cues for sequencing;Verbal cues for technique    Stand to Sit Details  cues for shifting weight forward to maintian balance      Ambulation/Gait   Ambulation/Gait  Yes    Ambulation/Gait Assistance  4: Min assist    Ambulation/Gait Assistance Details  verbal & tactile cues on wt shift over prosthesis in stance, sequence & turning to position to sit.     Ambulation Distance (Feet)  15 Feet    Assistive device  Rolling walker;Prosthesis    Gait Pattern  Step-to pattern;Decreased step length - right;Decreased stance time - left;Decreased stride length;Decreased weight shift to left;Left circumduction;Left hip hike;Antalgic;Lateral hip instability;Trunk flexed;Abducted - left;Poor foot clearance - left;Poor foot clearance - right    Ambulation Surface  Level;Indoor      Prosthetics   Prosthetic Care Comments   Reviewed need for patient to perform donning sleeve at home even if increases amount of time.   Increase wear to 4 hrs 2x/day    Current prosthetic wear tolerance (days/week)   daily    Current prosthetic wear tolerance (#hours/day)   4 hrs x2    Residual limb condition   intact with no issues.     Education Provided  Correct ply sock adjustment;Proper wear schedule/adjustment;Proper Donning;Proper Doffing;Residual limb care;Other (comment)    Person(s) Educated  Patient;Caregiver(s)    Education Method  Explanation    Education Method  Verbalized understanding    Donning Prosthesis  Minimal assist             PT Education - 04/02/18 1338    Education Details  NOT to use powder to help getting sleeve on  due to safety.. Discussed goals checked. Continue to practiced with caregiver sit<>stands, 90* and 180* turns.    Person(s) Educated  Patient;Caregiver(s)    Methods  Explanation;Demonstration;Verbal cues;Tactile cues    Comprehension  Verbalized understanding;Returned demonstration;Verbal cues required;Tactile cues required;Need further instruction       PT Short Term Goals - 04/02/18 1343      PT SHORT TERM GOAL #1   Title  Patient donnes prosthesis including tightening suspension strap with supervision. (All STGs Target Date 04/04/2018)    Baseline  04/02/18 Pt progressing toward goal requiring Min A  to  don prosthesis including tightening suspension strap.     Time  4    Period  Weeks    Status  Partially Met      PT SHORT TERM GOAL #2   Title  Patient tolerates daily prosthesis wear >6hrs total / day.     Baseline  04/02/18 Pt progressing toward goal wearing prosthesis 5-6 hours consistently.    Time  4    Period  Weeks    Status  Partially Met      PT SHORT TERM GOAL #3   Title  Patient sit to/from stand from chairs with armrests to rolling walker with prosthesis with supervision.     Baseline  04/02/18 Pt progressing toward goal requiring Min A sit to/from stand from chairs with armrests to rolling walker with prosthesis.    Time  4    Period  Weeks    Status  Partially Met      PT SHORT TERM GOAL #4   Title  Patient ambulates 9' with rolling walker & locked prosthesis with minA.     Baseline  04/02/18 Pt progressing toward goal ambulates 10-15' with rolling walker & locked prosthesis with minA.     Time  4    Period  Weeks    Status  Partially Met        PT Long Term Goals - 03/13/18 1058      PT LONG TERM GOAL #1   Title  Patient & sister verbalize & demonstrate understanding of proper prosthetic care to enable safe use of prosthesis.  (All LTG Target Dates:  05/30/2018)    Time  12    Period  Weeks    Status  On-going    Target Date  05/30/18      PT LONG TERM  GOAL #2   Title  Patient tolerates wear of Transfemoral prosthesis >75% of awake hours without skin issues or limb pain.     Time  12    Period  Weeks    Status  On-going    Target Date  05/30/18      PT LONG TERM GOAL #3   Title  Standing balance with RW support: reaching 10" anteriorly, picks up object from floor, scans environment & manages clothes for toileting modified indepenent.     Time  12    Period  Weeks    Status  On-going    Target Date  05/30/18      PT LONG TERM GOAL #4   Title  Patient ambulates 200' with RW & prosthesis with supervision for limited community mobility.     Time  12    Period  Weeks    Status  On-going    Target Date  05/30/18      PT LONG TERM GOAL #5   Title  Patient ambulates 34' around furniture with RW & prosthesis modified independent for household mobility.     Time  12    Period  Weeks    Status  On-going    Target Date  05/30/18      PT LONG TERM GOAL #6   Title  Patient negotiates stairs with 2 rails, ramps & curbs with RW & prosthesis with minimal assist for community access.     Time  12    Period  Weeks    Status  On-going    Target Date  05/30/18            Plan - 04/02/18  [place] checked today;  Pt partially met all goals.  Pt progressed with each goal since eval but requires greater assist with gait, donning prosthesis, needs more time to reach goal for consistent > 6 hrs wear time, and continues to need training gait training for mechanics and activity tolerance to meet STG level.                                                                  Rehab Potential  Good    Clinical Impairments Affecting Rehab Potential  pt unable to read / write and sister who is caregiver reports limited ability to read / write    PT Frequency  3x / week 3x/wk for 4 weeks then 2x/wk for 8 weeks    PT Duration  12 weeks    PT Treatment/Interventions  ADLs/Self Care Home Management;DME Instruction;Gait  training;Stair training;Functional mobility training;Therapeutic activities;Therapeutic exercise;Balance training;Neuromuscular re-education;Patient/family education;Prosthetic Training;Passive range of motion;Vestibular;Canalith Repostioning    PT Next Visit Plan  per initial POC frequency 2x/wk starting next week,  review prosthetic care, work on sit to/from stand & stand-pivot transfers, prosthetic gait with RW    Consulted and Agree with Plan of Care  Patient;Family member/caregiver    Family Member Consulted  Niece, Danielle Buchanan        Patient will benefit from skilled therapeutic intervention in order to improve the following deficits and impairments:  Abnormal gait, Decreased activity tolerance, Decreased balance, Decreased coordination, Decreased endurance, Decreased knowledge of use of DME, Decreased mobility, Decreased range of motion, Decreased scar mobility, Decreased strength, Impaired flexibility, Postural dysfunction, Obesity, Prosthetic Dependency  Visit Diagnosis: Muscle weakness (generalized)  Unsteadiness on feet  Other abnormalities of gait and mobility  Abnormal posture     Problem List Patient Active Problem List   Diagnosis Date Noted  . Atherosclerosis of native arteries of the extremities with gangrene ([place]) 11/01/2017  . Unilateral AKA, left ([place])   . Post-operative pain   . OSA (obstructive sleep apnea)   . Benign essential HTN   . Diabetes mellitus type 2 in obese ([place])   . Hyperkalemia   . Leukocytosis   . Acute blood loss anemia   . Amputation stump infection ([place]) 08/23/2017  . Amputation of left lower extremity above knee upon examination ([place]) 08/23/2017  . PAD (peripheral artery disease) ([place]) 07/23/2017  . Cellulitis 06/10/2017  . Hyponatremia 06/10/2017  . Fever 06/10/2017  . Rash 05/24/2017  . Diabetes ([place])   . HTN (hypertension)   . Allergic rhinitis   . Insomnia   . Depression    Bjorn Loser, PTA  04/02/18, 2:02 PM [facility] [address] [place], [place], [address] Phone: [phone]   Fax:  [phone]  Name: Danielle Buchanan MRN: [id number] Date of Birth: 1956/02/24

## 2018-04-04 ENCOUNTER — Ambulatory Visit: Payer: Medicare HMO | Admitting: Physical Therapy

## 2018-04-04 ENCOUNTER — Encounter: Payer: Self-pay | Admitting: Physical Therapy

## 2018-04-04 DIAGNOSIS — R2689 Other abnormalities of gait and mobility: Secondary | ICD-10-CM | POA: Diagnosis not present

## 2018-04-04 DIAGNOSIS — S88012A Complete traumatic amputation at knee level, left lower leg, initial encounter: Secondary | ICD-10-CM | POA: Diagnosis not present

## 2018-04-04 DIAGNOSIS — M6281 Muscle weakness (generalized): Secondary | ICD-10-CM

## 2018-04-04 DIAGNOSIS — R2681 Unsteadiness on feet: Secondary | ICD-10-CM | POA: Diagnosis not present

## 2018-04-04 DIAGNOSIS — E1121 Type 2 diabetes mellitus with diabetic nephropathy: Secondary | ICD-10-CM | POA: Diagnosis not present

## 2018-04-04 DIAGNOSIS — T8189XA Other complications of procedures, not elsewhere classified, initial encounter: Secondary | ICD-10-CM | POA: Diagnosis not present

## 2018-04-04 DIAGNOSIS — R293 Abnormal posture: Secondary | ICD-10-CM | POA: Diagnosis not present

## 2018-04-04 DIAGNOSIS — Z89619 Acquired absence of unspecified leg above knee: Secondary | ICD-10-CM | POA: Diagnosis not present

## 2018-04-04 DIAGNOSIS — R296 Repeated falls: Secondary | ICD-10-CM | POA: Diagnosis not present

## 2018-04-04 DIAGNOSIS — Z89612 Acquired absence of left leg above knee: Secondary | ICD-10-CM | POA: Diagnosis not present

## 2018-04-04 DIAGNOSIS — M25652 Stiffness of left hip, not elsewhere classified: Secondary | ICD-10-CM | POA: Diagnosis not present

## 2018-04-06 NOTE — Therapy (Signed)
[facility] [address] [place], [place], [address] Phone: [phone]   Fax:  [phone]  Physical Therapy Treatment  Patient Details  Name: Danielle Buchanan MRN: [id number] Date of Birth: Jul 11, 1956 Referring Provider: Maurice Small, MD   Encounter Date: 04/04/2018   04/04/18 1023  PT Visits / Re-Eval  Visit Number 10  Number of Visits 29  Date for PT Re-Evaluation 06/03/18  Authorization  Authorization Type [employer]  Authorization Time Period $3400 OOP max has been met prior to PT evaluation. Visit limit Medicare guidelines  PT Time Calculation  PT Start Time 1019  PT Stop Time 1100  PT Time Calculation (min) 41 min  PT - End of Session  Equipment Utilized During Treatment Gait belt  Activity Tolerance Patient tolerated treatment well;No increased pain  Behavior During Therapy WFL for tasks assessed/performed     Past Medical History:  Diagnosis Date  . Allergic rhinitis   . Depression   . Diabetes ([place])   . Dyslipidemia   . HTN (hypertension)   . Insomnia   . OSA (obstructive sleep apnea)    no cpap  . Urinary incontinence     Past Surgical History:  Procedure Laterality Date  . ABDOMINAL AORTOGRAM W/LOWER EXTREMITY N/A 06/24/2017   Procedure: ABDOMINAL AORTOGRAM W/LOWER EXTREMITY;  Surgeon: Waynetta Sandy, MD;  Location: [facility];  Service: Cardiovascular;  Laterality: N/A;  . ABDOMINAL AORTOGRAM W/LOWER EXTREMITY Right 10/09/2017   Procedure: ABDOMINAL AORTOGRAM W/LOWER EXTREMITY;  Surgeon: Waynetta Sandy, MD;  Location: [facility];  Service: Cardiovascular;  Laterality: Right;  . AMPUTATION Left 07/23/2017   Procedure: AMPUTATION  BELOW KNEE;  Surgeon: Waynetta Sandy, MD;  Location: [facility];  Service: Vascular;  Laterality: Left;  . AMPUTATION Left 08/12/2017   Procedure: REVISION BELOW KNEE;  Surgeon: Waynetta Sandy, MD;  Location: [facility];  Service: Vascular;  Laterality: Left;  . AMPUTATION Left 08/27/2017   Procedure: left ABOVE KNEE AMPUTATION;  Surgeon: Waynetta Sandy, MD;  Location: [facility];  Service: Vascular;  Laterality: Left;  . AMPUTATION Left 08/30/2017   Procedure: REVISION AMPUTATION ABOVE KNEE;  Surgeon: Serafina Mitchell, MD;  Location: [facility];  Service: Vascular;  Laterality: Left;  . AMPUTATION Right 11/04/2017   Procedure: AMPUTATION RIGHT FOURTH TOE;  Surgeon: Waynetta Sandy, MD;  Location: [facility];  Service: Vascular;  Laterality: Right;  . APPLICATION OF WOUND VAC Left 08/27/2017   Procedure: APPLICATION OF WOUND VAC;  Surgeon: Waynetta Sandy, MD;  Location: [facility];  Service: Vascular;  Laterality: Left;  . CARPAL TUNNEL RELEASE Right   . COLONOSCOPY    . LOWER EXTREMITY INTERVENTION Right 10/09/2017   Procedure: LOWER EXTREMITY INTERVENTION;  Surgeon: Waynetta Sandy, MD;  Location: [facility];  Service: Cardiovascular;  Laterality: Right;    There were no vitals filed for this visit.     04/04/18 1021  Symptoms/Limitations  Subjective No new complaints except for some right leg pain. No falls. Doing the sink program with [name] or [name] as she still needs cues on posture/ex form.   Patient is accompained by: Family member (niece [name])  Pertinent History left TFA, right 4th toe amputation, DM2, PAD, HTN, right carpal tunnel release,  Limitations Lifting;Standing;Walking;House hold activities  Patient Stated Goals Walk with prosthesis in home & in community  Pain Assessment  Currently in Pain? Yes  Pain Score 8  Pain Location Knee  Pain  Orientation Right  Pain Descriptors / Indicators Aching;Sore  Pain Type Chronic pain  Pain Onset 1 to 4 weeks ago  Pain Frequency Intermittent  Aggravating Factors  standing and walking  Pain Relieving Factors sitting to rest      04/04/18 1024  Transfers  Transfers Sit to Stand;Stand to Sit  Sit to Stand 4: Min  assist;4: Min guard;With upper extremity assist;With armrests;From chair/3-in-1  Sit to Stand Details Verbal cues for sequencing;Verbal cues for technique;Verbal cues for precautions/safety;Verbal cues for safe use of DME/AE;Manual facilitation for weight shifting  Stand to Sit 4: Min guard;4: Min assist;With upper extremity assist;To chair/3-in-1  Stand to Sit Details (indicate cue type and reason) Verbal cues for sequencing;Verbal cues for technique;Verbal cues for safe use of DME/AE;Manual facilitation for weight shifting  Ambulation/Gait  Ambulation/Gait Yes  Ambulation/Gait Assistance 4: Min assist  Ambulation/Gait Assistance Details multimodal cues   Ambulation Distance (Feet) 15 Feet (x4, 30 x1)  Assistive device Rolling walker;Prosthesis  Gait Pattern Step-to pattern;Decreased step length - right;Decreased stance time - left;Decreased stride length;Decreased weight shift to left;Left circumduction;Left hip hike;Antalgic;Lateral hip instability;Trunk flexed;Abducted - left;Poor foot clearance - left;Poor foot clearance - right  Ambulation Surface Level;Indoor  Prosthetics  Prosthetic Care Comments  pt reports she is now able to self don her liner, continues to need assistance for posthesis  Current prosthetic wear tolerance (days/week)  daily  Current prosthetic wear tolerance (#hours/day)  4 hrs x2  Residual limb condition  intact with no issues.   Education Provided Residual limb care;Proper wear schedule/adjustment;Proper weight-bearing schedule/adjustment;Correct ply sock adjustment  Person(s) Educated Patient;Caregiver(s)  Education Method Explanation;Demonstration;Verbal cues  Education Method Verbalized understanding;Returned demonstration;Verbal cues required;Needs further instruction  Donning Prosthesis 5;4  Doffing Prosthesis 5      PT Short Term Goals - 04/02/18 1343      PT SHORT TERM GOAL #1   Title  Patient donnes prosthesis including tightening suspension strap  with supervision. (All STGs Target Date 04/04/2018)    Baseline  04/02/18 Pt progressing toward goal requiring Min A  to don prosthesis including tightening suspension strap.     Time  4    Period  Weeks    Status  Partially Met      PT SHORT TERM GOAL #2   Title  Patient tolerates daily prosthesis wear >6hrs total / day.     Baseline  04/02/18 Pt progressing toward goal wearing prosthesis 5-6 hours consistently.    Time  4    Period  Weeks    Status  Partially Met      PT SHORT TERM GOAL #3   Title  Patient sit to/from stand from chairs with armrests to rolling walker with prosthesis with supervision.     Baseline  04/02/18 Pt progressing toward goal requiring Min A sit to/from stand from chairs with armrests to rolling walker with prosthesis.    Time  4    Period  Weeks    Status  Partially Met      PT SHORT TERM GOAL #4   Title  Patient ambulates 40' with rolling walker & locked prosthesis with minA.     Baseline  04/02/18 Pt progressing toward goal ambulates 10-15' with rolling walker & locked prosthesis with minA.     Time  4    Period  Weeks    Status  Partially Met        PT Long Term Goals - 03/13/18 1058      PT LONG TERM  GOAL #1   Title  Patient & sister verbalize & demonstrate understanding of proper prosthetic care to enable safe use of prosthesis.  (All LTG Target Dates:  05/30/2018)    Time  12    Period  Weeks    Status  On-going    Target Date  05/30/18      PT LONG TERM GOAL #2   Title  Patient tolerates wear of Transfemoral prosthesis >75% of awake hours without skin issues or limb pain.     Time  12    Period  Weeks    Status  On-going    Target Date  05/30/18      PT LONG TERM GOAL #3   Title  Standing balance with RW support: reaching 10" anteriorly, picks up object from floor, scans environment & manages clothes for toileting modified indepenent.     Time  12    Period  Weeks    Status  On-going    Target Date  05/30/18      PT LONG TERM GOAL #4    Title  Patient ambulates 200' with RW & prosthesis with supervision for limited community mobility.     Time  12    Period  Weeks    Status  On-going    Target Date  05/30/18      PT LONG TERM GOAL #5   Title  Patient ambulates 58' around furniture with RW & prosthesis modified independent for household mobility.     Time  12    Period  Weeks    Status  On-going    Target Date  05/30/18      PT LONG TERM GOAL #6   Title  Patient negotiates stairs with 2 rails, ramps & curbs with RW & prosthesis with minimal assist for community access.     Time  12    Period  Weeks    Status  On-going    Target Date  05/30/18         04/04/18 1024  Plan  Clinical Impression Statement Today's skilled session continued to focus on transfers and short distance gait with RW/prosthesis with less overall assistance/cues needed as reps progressed. Pt is progressing toward goals and should benefit from continued PT to progress toward unmet goals.   Pt will benefit from skilled therapeutic intervention in order to improve on the following deficits Abnormal gait;Decreased activity tolerance;Decreased balance;Decreased coordination;Decreased endurance;Decreased knowledge of use of DME;Decreased mobility;Decreased range of motion;Decreased scar mobility;Decreased strength;Impaired flexibility;Postural dysfunction;Obesity;Prosthetic Dependency  Rehab Potential Good  Clinical Impairments Affecting Rehab Potential pt unable to read / write and sister who is caregiver reports limited ability to read / write  PT Frequency 3x / week (3x/wk for 4 weeks then 2x/wk for 8 weeks)  PT Duration 12 weeks  PT Treatment/Interventions ADLs/Self Care Home Management;DME Instruction;Gait training;Stair training;Functional mobility training;Therapeutic activities;Therapeutic exercise;Balance training;Neuromuscular re-education;Patient/family education;Prosthetic Training;Passive range of motion;Vestibular;Canalith Repostioning  PT  Next Visit Plan review prosthetic care, work on sit to/from stand & stand-pivot transfers, prosthetic gait with RW  Consulted and Agree with Plan of Care Patient;Family member/caregiver  Family Member Consulted Niece, [name]      Patient will benefit from skilled therapeutic intervention in order to improve the following deficits and impairments:  Abnormal gait, Decreased activity tolerance, Decreased balance, Decreased coordination, Decreased endurance, Decreased knowledge of use of DME, Decreased mobility, Decreased range of motion, Decreased scar mobility, Decreased strength, Impaired flexibility, Postural dysfunction, Obesity, Prosthetic Dependency  Visit Diagnosis:  Muscle weakness (generalized)  Unsteadiness on feet  Other abnormalities of gait and mobility     Problem List Patient Active Problem List   Diagnosis Date Noted  . Atherosclerosis of native arteries of the extremities with gangrene ([facility]) 11/01/2017  . Unilateral AKA, left ([facility])   . Post-operative pain   . OSA (obstructive sleep apnea)   . Benign essential HTN   . Diabetes mellitus type 2 in obese ([facility])   . Hyperkalemia   . Leukocytosis   . Acute blood loss anemia   . Amputation stump infection ([facility]) 08/23/2017  . Amputation of left lower extremity above knee upon examination ([place] [facility] [place]) 08/23/2017  . PAD (peripheral artery disease) ([facility]) 07/23/2017  . Cellulitis 06/10/2017  . Hyponatremia 06/10/2017  . Fever 06/10/2017  . Rash 05/24/2017  . Diabetes ([facility])   . HTN (hypertension)   . Allergic rhinitis   . Insomnia   . Depression     [facility], [place], [facility] [address] [place], [place] [address] [phone] 04/06/18, 3:40 PM   Name: Ianna Salmela MRN: [id number] Date of Birth: 1956/05/31

## 2018-04-07 ENCOUNTER — Ambulatory Visit: Payer: Medicare HMO | Attending: Family Medicine | Admitting: Physical Therapy

## 2018-04-07 ENCOUNTER — Encounter: Payer: Self-pay | Admitting: Physical Therapy

## 2018-04-07 DIAGNOSIS — R2681 Unsteadiness on feet: Secondary | ICD-10-CM | POA: Insufficient documentation

## 2018-04-07 DIAGNOSIS — M6281 Muscle weakness (generalized): Secondary | ICD-10-CM | POA: Diagnosis not present

## 2018-04-07 DIAGNOSIS — M25652 Stiffness of left hip, not elsewhere classified: Secondary | ICD-10-CM | POA: Diagnosis not present

## 2018-04-07 DIAGNOSIS — R293 Abnormal posture: Secondary | ICD-10-CM | POA: Diagnosis not present

## 2018-04-07 DIAGNOSIS — R2689 Other abnormalities of gait and mobility: Secondary | ICD-10-CM | POA: Insufficient documentation

## 2018-04-08 NOTE — Therapy (Signed)
[facility] [address] [place], [place], [address] Phone: [phone]   Fax:  [phone]  Physical Therapy Treatment  Patient Details  Name: Danielle Buchanan MRN: [id number] Date of Birth: 1956/02/20 Referring Provider: Maurice Small, MD   Encounter Date: 04/07/2018  PT End of Session - 04/07/18 1114    Visit Number  11    Number of Visits  29    Date for PT Re-Evaluation  06/03/18    Authorization Type  [employer]    Authorization Time Period  $3400 OOP max has been met prior to PT evaluation. Visit limit Medicare guidelines    PT Start Time  1020    PT Stop Time  1105    PT Time Calculation (min)  45 min    Equipment Utilized During Treatment  Gait belt    Activity Tolerance  Patient tolerated treatment well;No increased pain    Behavior During Therapy  WFL for tasks assessed/performed       Past Medical History:  Diagnosis Date  . Allergic rhinitis   . Depression   . Diabetes ([facility])   . Dyslipidemia   . HTN (hypertension)   . Insomnia   . OSA (obstructive sleep apnea)    no cpap  . Urinary incontinence     Past Surgical History:  Procedure Laterality Date  . ABDOMINAL AORTOGRAM W/LOWER EXTREMITY N/A 06/24/2017   Procedure: ABDOMINAL AORTOGRAM W/LOWER EXTREMITY;  Surgeon: Waynetta Sandy, MD;  Location: [facility];  Service: Cardiovascular;  Laterality: N/A;  . ABDOMINAL AORTOGRAM W/LOWER EXTREMITY Right 10/09/2017   Procedure: ABDOMINAL AORTOGRAM W/LOWER EXTREMITY;  Surgeon: Waynetta Sandy, MD;  Location: [facility];  Service: Cardiovascular;  Laterality: Right;  . AMPUTATION Left 07/23/2017   Procedure: AMPUTATION  BELOW KNEE;  Surgeon: Waynetta Sandy, MD;  Location: [facility];  Service: Vascular;  Laterality: Left;  . AMPUTATION Left 08/12/2017   Procedure: REVISION BELOW KNEE;  Surgeon: Waynetta Sandy, MD;  Location: [facility];  Service: Vascular;   Laterality: Left;  . AMPUTATION Left 08/27/2017   Procedure: left ABOVE KNEE AMPUTATION;  Surgeon: Waynetta Sandy, MD;  Location: [facility];  Service: Vascular;  Laterality: Left;  . AMPUTATION Left 08/30/2017   Procedure: REVISION AMPUTATION ABOVE KNEE;  Surgeon: Serafina Mitchell, MD;  Location: [facility];  Service: Vascular;  Laterality: Left;  . AMPUTATION Right 11/04/2017   Procedure: AMPUTATION RIGHT FOURTH TOE;  Surgeon: Waynetta Sandy, MD;  Location: [facility];  Service: Vascular;  Laterality: Right;  . APPLICATION OF WOUND VAC Left 08/27/2017   Procedure: APPLICATION OF WOUND VAC;  Surgeon: Waynetta Sandy, MD;  Location: [facility];  Service: Vascular;  Laterality: Left;  . CARPAL TUNNEL RELEASE Right   . COLONOSCOPY    . LOWER EXTREMITY INTERVENTION Right 10/09/2017   Procedure: LOWER EXTREMITY INTERVENTION;  Surgeon: Waynetta Sandy, MD;  Location: [facility];  Service: Cardiovascular;  Laterality: Right;    There were no vitals filed for this visit.  Subjective Assessment - 04/07/18 1020    Subjective  She put prosthesis on limb by herself this morning. It took 15 minutes but no assistance. She is wearing ~4 hrs but only once. Plans to do twice now she got prosthesis on by herself.     Patient is accompained by:  Family member niece Danielle Buchanan    Pertinent History  left TFA, right 4th toe amputation, DM2, PAD, HTN, right carpal  tunnel release,    Limitations  Lifting;Standing;Walking;House hold activities    Patient Stated Goals  Walk with prosthesis in home & in community    Currently in Pain?  Yes    Pain Score  5     Pain Location  Knee    Pain Descriptors / Indicators  Aching;Sore    Pain Onset  1 to 4 weeks ago    Pain Frequency  Intermittent    Aggravating Factors   standing & walking     Pain Relieving Factors  sitting to rest    Multiple Pain Sites  No                       [facility] Adult PT Treatment/Exercise - 04/07/18 1020       Transfers   Transfers  Sit to Stand;Stand to Sit    Sit to Stand  4: Min guard;5: Supervision;With upper extremity assist;With armrests;From chair/3-in-1 to RW    Sit to Stand Details  Verbal cues for sequencing;Verbal cues for technique;Verbal cues for precautions/safety;Verbal cues for safe use of DME/AE;Manual facilitation for weight shifting    Stand to Sit  4: Min guard;5: Supervision;With upper extremity assist;With armrests;To chair/3-in-1 from RW    Stand to Sit Details (indicate cue type and reason)  Verbal cues for sequencing;Verbal cues for technique;Verbal cues for safe use of DME/AE;Manual facilitation for weight shifting      Ambulation/Gait   Ambulation/Gait  Yes    Ambulation/Gait Assistance  4: Min assist    Ambulation/Gait Assistance Details  verbal & manual cues for posture, wt shift over prosthesis in stance. Pt has 1/4" wedge under her custom orthosis and pelvis level.  Patient has difficulty advancing prosthesis due to contact with ground.  With locked knee prosthesis typically decreased 1/4" for clearance. So PT increased right shoe lift to 1/2" under custom orthosis.  Improved prosthesis clearance second walk.     Ambulation Distance (Feet)  25 Feet 25' X 2    Assistive device  Rolling walker;Prosthesis    Gait Pattern  Step-to pattern;Decreased step length - right;Decreased stance time - left;Decreased stride length;Decreased weight shift to left;Left circumduction;Left hip hike;Antalgic;Lateral hip instability;Trunk flexed;Abducted - left;Poor foot clearance - left;Poor foot clearance - right    Ambulation Surface  Indoor;Level      Prosthetics   Prosthetic Care Comments   Pt arrived with prosthesis donned with report first time donned alone.  She forgot prosthetic socks. Pt doffed prosthesis with increased time. Liner had wrinkle posterior aspect. PT instructed pt to feel liner prior to continuing donning with socks & prosthesis. If wrinkle only need to roll liner  down to location of wrinkle and then roll back up.  PT recommended photo collage due to reading & writing limitations. Niece verbalized understanding.  PT cued on need to tighten suspension strap after some standing & walking due to limb seating deeper and strap loose causing pistoning.     Current prosthetic wear tolerance (days/week)   daily    Current prosthetic wear tolerance (#hours/day)   4 hrs x2 increase to 5hrs 2x/day    Residual limb condition   intact with no issues.     Education Provided  Residual limb care;Proper wear schedule/adjustment;Proper weight-bearing schedule/adjustment;Correct ply sock adjustment;Proper Donning;Other (comment) see prosthetic care comments    Person(s) Educated  Patient;Caregiver(s)    Education Method  Explanation;Demonstration;Tactile cues;Verbal cues    Education Method  Verbalized understanding;Verbal cues required;Needs further instruction  Donning Prosthesis  Supervision    Doffing Prosthesis  Supervision               PT Short Term Goals - 04/08/18 5927      PT SHORT TERM GOAL #1   Title  Patient correctly donnes prosthesis without assistance or cues. (All updated STGs Target Date 05/02/2018)    Time  4    Period  Weeks    Status  New    Target Date  05/02/18      PT SHORT TERM GOAL #2   Title  Patient tolerates daily prosthesis wear >10hrs total / day.     Time  4    Period  Weeks    Status  New    Target Date  05/02/18      PT SHORT TERM GOAL #3   Title  Patient sit to/from stand from chairs without armrests to rolling walker with prosthesis with supervision.     Time  4    Period  Weeks    Status  New    Target Date  05/02/18      PT SHORT TERM GOAL #4   Title  Patient ambulates 73' with rolling walker & locked prosthesis with min guard.    Time  4    Period  Weeks    Status  New    Target Date  05/02/18      PT SHORT TERM GOAL #5   Title  Standing balance with RW reaching 5" anteriorly and within 10" of floor with  supervision.     Time  4    Period  Weeks    Status  New    Target Date  05/02/18        PT Long Term Goals - 03/13/18 1058      PT LONG TERM GOAL #1   Title  Patient & sister verbalize & demonstrate understanding of proper prosthetic care to enable safe use of prosthesis.  (All LTG Target Dates:  05/30/2018)    Time  12    Period  Weeks    Status  On-going    Target Date  05/30/18      PT LONG TERM GOAL #2   Title  Patient tolerates wear of Transfemoral prosthesis >75% of awake hours without skin issues or limb pain.     Time  12    Period  Weeks    Status  On-going    Target Date  05/30/18      PT LONG TERM GOAL #3   Title  Standing balance with RW support: reaching 10" anteriorly, picks up object from floor, scans environment & manages clothes for toileting modified indepenent.     Time  12    Period  Weeks    Status  On-going    Target Date  05/30/18      PT LONG TERM GOAL #4   Title  Patient ambulates 200' with RW & prosthesis with supervision for limited community mobility.     Time  12    Period  Weeks    Status  On-going    Target Date  05/30/18      PT LONG TERM GOAL #5   Title  Patient ambulates 55' around furniture with RW & prosthesis modified independent for household mobility.     Time  12    Period  Weeks    Status  On-going    Target Date  05/30/18  PT LONG TERM GOAL #6   Title  Patient negotiates stairs with 2 rails, ramps & curbs with RW & prosthesis with minimal assist for community access.     Time  12    Period  Weeks    Status  On-going    Target Date  05/30/18            Plan - 04/07/18 1710    Clinical Impression Statement  Patient is reporting improved ability to donne prosthesis but needs supervision as had wrinkle in liner which can lead to skin issues.  Patient had improved prosthesis clearance with 1/2" heel wedge in sound shoe. PT spoke with prosthetist who plans to attend next PT session.     Rehab Potential  Good     Clinical Impairments Affecting Rehab Potential  pt unable to read / write and sister who is caregiver reports limited ability to read / write    PT Frequency  3x / week 3x/wk for 4 weeks then 2x/wk for 8 weeks    PT Duration  12 weeks    PT Treatment/Interventions  ADLs/Self Care Home Management;DME Instruction;Gait training;Stair training;Functional mobility training;Therapeutic activities;Therapeutic exercise;Balance training;Neuromuscular re-education;Patient/family education;Prosthetic Training;Passive range of motion;Vestibular;Canalith Repostioning    PT Next Visit Plan  prosthetist attending next session; work towards updated STGs;  prosthetic care, work on sit to/from stand & stand-pivot transfers, prosthetic gait with RW    Consulted and Agree with Plan of Care  Patient;Family member/caregiver    Family Member Consulted  Niece, Danielle Buchanan        Patient will benefit from skilled therapeutic intervention in order to improve the following deficits and impairments:  Abnormal gait, Decreased activity tolerance, Decreased balance, Decreased coordination, Decreased endurance, Decreased knowledge of use of DME, Decreased mobility, Decreased range of motion, Decreased scar mobility, Decreased strength, Impaired flexibility, Postural dysfunction, Obesity, Prosthetic Dependency  Visit Diagnosis: Muscle weakness (generalized)  Unsteadiness on feet  Other abnormalities of gait and mobility  Abnormal posture  Stiffness of left hip, not elsewhere classified     Problem List Patient Active Problem List   Diagnosis Date Noted  . Atherosclerosis of native arteries of the extremities with gangrene ([facility]) 11/01/2017  . Unilateral AKA, left ([facility])   . Post-operative pain   . OSA (obstructive sleep apnea)   . Benign essential HTN   . Diabetes mellitus type 2 in obese ([facility])   . Hyperkalemia   . Leukocytosis   . Acute blood loss anemia   . Amputation stump infection ([facility]) 08/23/2017  . Amputation of  left lower extremity above knee upon examination ([facility]) 08/23/2017  . PAD (peripheral artery disease) ([facility]) 07/23/2017  . Cellulitis 06/10/2017  . Hyponatremia 06/10/2017  . Fever 06/10/2017  . Rash 05/24/2017  . Diabetes ([facility])   . HTN (hypertension)   . Allergic rhinitis   . Insomnia   . Depression     Nello Corro PT, DPT 04/08/2018, 7:15 AM  [facility] [address] [place], [place], [address] Phone: [phone]   Fax:  [phone]  Name: Danielle Buchanan MRN: [id number] Date of Birth: 10-Mar-1956

## 2018-04-09 ENCOUNTER — Ambulatory Visit: Payer: Medicare HMO | Admitting: Physical Therapy

## 2018-04-09 DIAGNOSIS — M6281 Muscle weakness (generalized): Secondary | ICD-10-CM

## 2018-04-09 DIAGNOSIS — M25652 Stiffness of left hip, not elsewhere classified: Secondary | ICD-10-CM | POA: Diagnosis not present

## 2018-04-09 DIAGNOSIS — R2681 Unsteadiness on feet: Secondary | ICD-10-CM | POA: Diagnosis not present

## 2018-04-09 DIAGNOSIS — R293 Abnormal posture: Secondary | ICD-10-CM | POA: Diagnosis not present

## 2018-04-09 DIAGNOSIS — R2689 Other abnormalities of gait and mobility: Secondary | ICD-10-CM

## 2018-04-10 NOTE — Therapy (Signed)
[facility] [address] [place], [place], [address] Phone: [phone]   Fax:  [phone]  Physical Therapy Treatment  Patient Details  Name: Danielle Buchanan MRN: [id number] Date of Birth: [birth year] Referring Provider: Maurice Small, MD   Encounter Date: 04/09/2018  PT End of Session - 04/09/18 1849    Visit Number  12    Number of Visits  29    Date for PT Re-Evaluation  06/03/18    Authorization Type  [employer]    Authorization Time Period  $3400 OOP max has been met prior to PT evaluation. Visit limit Medicare guidelines    PT Start Time  1017    PT Stop Time  1100    PT Time Calculation (min)  43 min    Equipment Utilized During Treatment  Gait belt    Activity Tolerance  Patient tolerated treatment well;No increased pain    Behavior During Therapy  WFL for tasks assessed/performed       Past Medical History:  Diagnosis Date  . Allergic rhinitis   . Depression   . Diabetes ([place])   . Dyslipidemia   . HTN (hypertension)   . Insomnia   . OSA (obstructive sleep apnea)    no cpap  . Urinary incontinence     Past Surgical History:  Procedure Laterality Date  . ABDOMINAL AORTOGRAM W/LOWER EXTREMITY N/A 06/24/2017   Procedure: ABDOMINAL AORTOGRAM W/LOWER EXTREMITY;  Surgeon: Waynetta Sandy, MD;  Location: [facility];  Service: Cardiovascular;  Laterality: N/A;  . ABDOMINAL AORTOGRAM W/LOWER EXTREMITY Right 10/09/2017   Procedure: ABDOMINAL AORTOGRAM W/LOWER EXTREMITY;  Surgeon: Waynetta Sandy, MD;  Location: [facility];  Service: Cardiovascular;  Laterality: Right;  . AMPUTATION Left 07/23/2017   Procedure: AMPUTATION  BELOW KNEE;  Surgeon: Waynetta Sandy, MD;  Location: [facility];  Service: Vascular;  Laterality: Left;  . AMPUTATION Left 08/12/2017   Procedure: REVISION BELOW KNEE;  Surgeon: Waynetta Sandy, MD;  Location: [facility];  Service: Vascular;   Laterality: Left;  . AMPUTATION Left 08/27/2017   Procedure: left ABOVE KNEE AMPUTATION;  Surgeon: Waynetta Sandy, MD;  Location: [facility];  Service: Vascular;  Laterality: Left;  . AMPUTATION Left 08/30/2017   Procedure: REVISION AMPUTATION ABOVE KNEE;  Surgeon: Serafina Mitchell, MD;  Location: [facility];  Service: Vascular;  Laterality: Left;  . AMPUTATION Right 11/04/2017   Procedure: AMPUTATION RIGHT FOURTH TOE;  Surgeon: Waynetta Sandy, MD;  Location: [facility];  Service: Vascular;  Laterality: Right;  . APPLICATION OF WOUND VAC Left 08/27/2017   Procedure: APPLICATION OF WOUND VAC;  Surgeon: Waynetta Sandy, MD;  Location: [facility];  Service: Vascular;  Laterality: Left;  . CARPAL TUNNEL RELEASE Right   . COLONOSCOPY    . LOWER EXTREMITY INTERVENTION Right 10/09/2017   Procedure: LOWER EXTREMITY INTERVENTION;  Surgeon: Waynetta Sandy, MD;  Location: [facility];  Service: Cardiovascular;  Laterality: Right;    There were no vitals filed for this visit.                    [facility] Adult PT Treatment/Exercise - 04/09/18 1020      Transfers   Transfers  Sit to Stand;Stand to Sit    Sit to Stand  4: Min guard;5: Supervision;With upper extremity assist;With armrests;From chair/3-in-1 to RW    Sit to Stand Details  Verbal cues for sequencing;Verbal cues for technique;Verbal cues for  precautions/safety;Verbal cues for safe use of DME/AE;Manual facilitation for weight shifting    Stand to Sit  4: Min guard;5: Supervision;With upper extremity assist;With armrests;To chair/3-in-1 from RW    Stand to Sit Details (indicate cue type and reason)  Verbal cues for sequencing;Verbal cues for technique;Verbal cues for safe use of DME/AE;Manual facilitation for weight shifting      Ambulation/Gait   Ambulation/Gait  Yes    Ambulation/Gait Assistance  4: Min assist    Ambulation/Gait Assistance Details  verbal & tactile cues on posture, weight shift over  prosthesis in stance and turning to position to sit 90* & 180*    Ambulation Distance (Feet)  25 Feet 25' X 3    Assistive device  Rolling walker;Prosthesis locked knee TFA prosthesis    Gait Pattern  Step-to pattern;Decreased step length - right;Decreased stance time - left;Decreased stride length;Decreased weight shift to left;Left circumduction;Left hip hike;Antalgic;Lateral hip instability;Trunk flexed;Abducted - left;Poor foot clearance - left;Poor foot clearance - right    Ambulation Surface  Indoor;Level      Prosthetics   Prosthetic Care Comments   Torrie Mayers, [facility] from [place] present during session. He adjusted toe-in after gait analysis while PT ambulating with pt. Plans for visit with CPO on Friday, 7/5 to add pads to socket which should help with end bearing and shorten prosthesis 1/2" & remove heel lift in right shoe.  Patient had bruise on distal medial limb consistent with loose tissue being pulled laterally with suspension strap and then end weight bearing.  PT instructed in placement of liner to initiate donning liner with minimal tissue displacement.     Current prosthetic wear tolerance (days/week)   daily    Current prosthetic wear tolerance (#hours/day)   4 hrs x2 increase to 5hrs 2x/day    Residual limb condition   intact with no issues.     Education Provided  Residual limb care;Proper wear schedule/adjustment;Proper weight-bearing schedule/adjustment;Correct ply sock adjustment;Proper Donning;Other (comment) see prosthetic care comments    Person(s) Educated  Patient;Caregiver(s)    Education Method  Explanation;Demonstration;Tactile cues;Verbal cues    Education Method  Verbalized understanding;Verbal cues required;Needs further instruction               PT Short Term Goals - 04/08/18 0705      PT SHORT TERM GOAL #1   Title  Patient correctly donnes prosthesis without assistance or cues. (All updated STGs Target Date 05/02/2018)    Time  4    Period  Weeks     Status  New    Target Date  05/02/18      PT SHORT TERM GOAL #2   Title  Patient tolerates daily prosthesis wear >10hrs total / day.     Time  4    Period  Weeks    Status  New    Target Date  05/02/18      PT SHORT TERM GOAL #3   Title  Patient sit to/from stand from chairs without armrests to rolling walker with prosthesis with supervision.     Time  4    Period  Weeks    Status  New    Target Date  05/02/18      PT SHORT TERM GOAL #4   Title  Patient ambulates 73' with rolling walker & locked prosthesis with min guard.    Time  4    Period  Weeks    Status  New    Target Date  05/02/18  PT SHORT TERM GOAL #5   Title  Standing balance with RW reaching 5" anteriorly and within 10" of floor with supervision.     Time  4    Period  Weeks    Status  New    Target Date  05/02/18        PT Long Term Goals - 03/13/18 1058      PT LONG TERM GOAL #1   Title  Patient & sister verbalize & demonstrate understanding of proper prosthetic care to enable safe use of prosthesis.  (All LTG Target Dates:  05/30/2018)    Time  12    Period  Weeks    Status  On-going    Target Date  05/30/18      PT LONG TERM GOAL #2   Title  Patient tolerates wear of Transfemoral prosthesis >75% of awake hours without skin issues or limb pain.     Time  12    Period  Weeks    Status  On-going    Target Date  05/30/18      PT LONG TERM GOAL #3   Title  Standing balance with RW support: reaching 10" anteriorly, picks up object from floor, scans environment & manages clothes for toileting modified indepenent.     Time  12    Period  Weeks    Status  On-going    Target Date  05/30/18      PT LONG TERM GOAL #4   Title  Patient ambulates 200' with RW & prosthesis with supervision for limited community mobility.     Time  12    Period  Weeks    Status  On-going    Target Date  05/30/18      PT LONG TERM GOAL #5   Title  Patient ambulates 52' around furniture with RW & prosthesis modified  independent for household mobility.     Time  12    Period  Weeks    Status  On-going    Target Date  05/30/18      PT LONG TERM GOAL #6   Title  Patient negotiates stairs with 2 rails, ramps & curbs with RW & prosthesis with minimal assist for community access.     Time  12    Period  Weeks    Status  On-going    Target Date  05/30/18            Plan - 04/09/18 1136    Clinical Impression Statement  Prosthetist present during session & made toe-in correction today and plans to see patient Friday, 7/5 to adjust height & add pads to socket.  Patient is improving gait with consistent instructions to technique.     Rehab Potential  Good    Clinical Impairments Affecting Rehab Potential  pt unable to read / write and sister who is caregiver reports limited ability to read / write    PT Frequency  3x / week 3x/wk for 4 weeks then 2x/wk for 8 weeks    PT Duration  12 weeks    PT Treatment/Interventions  ADLs/Self Care Home Management;DME Instruction;Gait training;Stair training;Functional mobility training;Therapeutic activities;Therapeutic exercise;Balance training;Neuromuscular re-education;Patient/family education;Prosthetic Training;Passive range of motion;Vestibular;Canalith Repostioning    PT Next Visit Plan  work towards updated STGs;  prosthetic care, work on sit to/from stand & stand-pivot transfers, prosthetic gait with RW    Consulted and Agree with Plan of Care  Patient;Family member/caregiver    Family Member Consulted  Niece, [name]        Patient will benefit from skilled therapeutic intervention in order to improve the following deficits and impairments:  Abnormal gait, Decreased activity tolerance, Decreased balance, Decreased coordination, Decreased endurance, Decreased knowledge of use of DME, Decreased mobility, Decreased range of motion, Decreased scar mobility, Decreased strength, Impaired flexibility, Postural dysfunction, Obesity, Prosthetic Dependency  Visit  Diagnosis: Muscle weakness (generalized)  Unsteadiness on feet  Other abnormalities of gait and mobility  Abnormal posture  Stiffness of left hip, not elsewhere classified     Problem List Patient Active Problem List   Diagnosis Date Noted  . Atherosclerosis of native arteries of the extremities with gangrene ([place]) 11/01/2017  . Unilateral AKA, left ([place])   . Post-operative pain   . OSA (obstructive sleep apnea)   . Benign essential HTN   . Diabetes mellitus type 2 in obese ([place])   . Hyperkalemia   . Leukocytosis   . Acute blood loss anemia   . Amputation stump infection ([place]) 08/23/2017  . Amputation of left lower extremity above knee upon examination ([place]) 08/23/2017  . PAD (peripheral artery disease) ([place]) 07/23/2017  . Cellulitis 06/10/2017  . Hyponatremia 06/10/2017  . Fever 06/10/2017  . Rash 05/24/2017  . Diabetes ([place])   . HTN (hypertension)   . Allergic rhinitis   . Insomnia   . Depression     Mignonne Afonso PT, DPT 04/10/2018, 11:39 AM  [facility] [address] [place], [place], [address] Phone: [phone]   Fax:  [phone]  Name: Danielle Buchanan MRN: [id number] Date of Birth: July 06, 1956

## 2018-04-11 ENCOUNTER — Other Ambulatory Visit: Payer: Self-pay

## 2018-04-11 DIAGNOSIS — I70229 Atherosclerosis of native arteries of extremities with rest pain, unspecified extremity: Secondary | ICD-10-CM

## 2018-04-11 DIAGNOSIS — Z89612 Acquired absence of left leg above knee: Secondary | ICD-10-CM | POA: Diagnosis not present

## 2018-04-11 DIAGNOSIS — I10 Essential (primary) hypertension: Secondary | ICD-10-CM | POA: Diagnosis not present

## 2018-04-11 DIAGNOSIS — F331 Major depressive disorder, recurrent, moderate: Secondary | ICD-10-CM | POA: Diagnosis not present

## 2018-04-11 DIAGNOSIS — I739 Peripheral vascular disease, unspecified: Secondary | ICD-10-CM

## 2018-04-11 DIAGNOSIS — I70261 Atherosclerosis of native arteries of extremities with gangrene, right leg: Secondary | ICD-10-CM

## 2018-04-11 DIAGNOSIS — Z89512 Acquired absence of left leg below knee: Secondary | ICD-10-CM

## 2018-04-11 DIAGNOSIS — E1121 Type 2 diabetes mellitus with diabetic nephropathy: Secondary | ICD-10-CM | POA: Diagnosis not present

## 2018-04-11 DIAGNOSIS — Z794 Long term (current) use of insulin: Secondary | ICD-10-CM | POA: Diagnosis not present

## 2018-04-11 DIAGNOSIS — E782 Mixed hyperlipidemia: Secondary | ICD-10-CM | POA: Diagnosis not present

## 2018-04-11 DIAGNOSIS — I779 Disorder of arteries and arterioles, unspecified: Secondary | ICD-10-CM

## 2018-04-11 DIAGNOSIS — Z89619 Acquired absence of unspecified leg above knee: Secondary | ICD-10-CM | POA: Diagnosis not present

## 2018-04-11 DIAGNOSIS — T8789 Other complications of amputation stump: Secondary | ICD-10-CM

## 2018-04-11 DIAGNOSIS — I998 Other disorder of circulatory system: Secondary | ICD-10-CM

## 2018-04-11 DIAGNOSIS — F4312 Post-traumatic stress disorder, chronic: Secondary | ICD-10-CM | POA: Diagnosis not present

## 2018-04-15 ENCOUNTER — Encounter: Payer: Self-pay | Admitting: Physical Therapy

## 2018-04-15 ENCOUNTER — Ambulatory Visit: Payer: Medicare HMO | Admitting: Physical Therapy

## 2018-04-15 DIAGNOSIS — E113293 Type 2 diabetes mellitus with mild nonproliferative diabetic retinopathy without macular edema, bilateral: Secondary | ICD-10-CM | POA: Diagnosis not present

## 2018-04-15 DIAGNOSIS — R293 Abnormal posture: Secondary | ICD-10-CM

## 2018-04-15 DIAGNOSIS — M25652 Stiffness of left hip, not elsewhere classified: Secondary | ICD-10-CM

## 2018-04-15 DIAGNOSIS — H2513 Age-related nuclear cataract, bilateral: Secondary | ICD-10-CM | POA: Diagnosis not present

## 2018-04-15 DIAGNOSIS — M6281 Muscle weakness (generalized): Secondary | ICD-10-CM

## 2018-04-15 DIAGNOSIS — R2681 Unsteadiness on feet: Secondary | ICD-10-CM | POA: Diagnosis not present

## 2018-04-15 DIAGNOSIS — R2689 Other abnormalities of gait and mobility: Secondary | ICD-10-CM | POA: Diagnosis not present

## 2018-04-15 DIAGNOSIS — G5621 Lesion of ulnar nerve, right upper limb: Secondary | ICD-10-CM | POA: Diagnosis not present

## 2018-04-15 DIAGNOSIS — H25013 Cortical age-related cataract, bilateral: Secondary | ICD-10-CM | POA: Diagnosis not present

## 2018-04-15 DIAGNOSIS — E119 Type 2 diabetes mellitus without complications: Secondary | ICD-10-CM | POA: Diagnosis not present

## 2018-04-15 NOTE — Therapy (Signed)
[facility] [address] [place], [place], [address] Phone: [phone]   Fax:  [phone]  Physical Therapy Treatment  Patient Details  Name: Danielle Buchanan MRN: [id number] Date of Birth: March 22, 1956 Referring Provider: Maurice Small, MD   Encounter Date: 04/15/2018  PT End of Session - 04/15/18 0942    Visit Number  13    Number of Visits  29    Date for PT Re-Evaluation  06/03/18    Authorization Type  [employer]    Authorization Time Period  $3400 OOP max has been met prior to PT evaluation. Visit limit Medicare guidelines    PT Start Time  [phone]    PT Stop Time  1015    PT Time Calculation (min)  39 min    Equipment Utilized During Treatment  Gait belt    Activity Tolerance  Patient tolerated treatment well;No increased pain    Behavior During Therapy  WFL for tasks assessed/performed       Past Medical History:  Diagnosis Date  . Allergic rhinitis   . Depression   . Diabetes ([place])   . Dyslipidemia   . HTN (hypertension)   . Insomnia   . OSA (obstructive sleep apnea)    no cpap  . Urinary incontinence     Past Surgical History:  Procedure Laterality Date  . ABDOMINAL AORTOGRAM W/LOWER EXTREMITY N/A 06/24/2017   Procedure: ABDOMINAL AORTOGRAM W/LOWER EXTREMITY;  Surgeon: Waynetta Sandy, MD;  Location: [facility];  Service: Cardiovascular;  Laterality: N/A;  . ABDOMINAL AORTOGRAM W/LOWER EXTREMITY Right 10/09/2017   Procedure: ABDOMINAL AORTOGRAM W/LOWER EXTREMITY;  Surgeon: Waynetta Sandy, MD;  Location: [facility];  Service: Cardiovascular;  Laterality: Right;  . AMPUTATION Left 07/23/2017   Procedure: AMPUTATION  BELOW KNEE;  Surgeon: Waynetta Sandy, MD;  Location: [facility];  Service: Vascular;  Laterality: Left;  . AMPUTATION Left 08/12/2017   Procedure: REVISION BELOW KNEE;  Surgeon: Waynetta Sandy, MD;  Location: [facility];  Service: Vascular;   Laterality: Left;  . AMPUTATION Left 08/27/2017   Procedure: left ABOVE KNEE AMPUTATION;  Surgeon: Waynetta Sandy, MD;  Location: [facility];  Service: Vascular;  Laterality: Left;  . AMPUTATION Left 08/30/2017   Procedure: REVISION AMPUTATION ABOVE KNEE;  Surgeon: Serafina Mitchell, MD;  Location: [facility];  Service: Vascular;  Laterality: Left;  . AMPUTATION Right 11/04/2017   Procedure: AMPUTATION RIGHT FOURTH TOE;  Surgeon: Waynetta Sandy, MD;  Location: [facility];  Service: Vascular;  Laterality: Right;  . APPLICATION OF WOUND VAC Left 08/27/2017   Procedure: APPLICATION OF WOUND VAC;  Surgeon: Waynetta Sandy, MD;  Location: [facility];  Service: Vascular;  Laterality: Left;  . CARPAL TUNNEL RELEASE Right   . COLONOSCOPY    . LOWER EXTREMITY INTERVENTION Right 10/09/2017   Procedure: LOWER EXTREMITY INTERVENTION;  Surgeon: Waynetta Sandy, MD;  Location: [facility];  Service: Cardiovascular;  Laterality: Right;    There were no vitals filed for this visit.  Subjective Assessment - 04/15/18 0941    Subjective  Shoes were interchanged with new one with toe cap. Pt.'s family member says she has been taking about 40 steps from her chair to the bathroom and back.    Patient is accompained by:  Family member nephew [name]    Pertinent History  left TFA, right 4th toe amputation, DM2, PAD, HTN, right carpal tunnel release,    Limitations  Lifting;Standing;Walking;House  hold activities    Patient Stated Goals  Walk with prosthesis in home & in community    Currently in Pain?  Yes    Pain Score  3     Pain Location  Wrist    Pain Orientation  Right    Pain Descriptors / Indicators  Burning    Pain Type  Chronic pain;Neuropathic pain    Pain Onset  More than a month ago    Aggravating Factors   Using a walker and putting weight through the wrist    Pain Relieving Factors  Taking weight off the wrist    Multiple Pain Sites  No        Gait- with  RW/prosthesis, min assist for balance with facilitation for weight shift to Left side and VCs for posture, step length, step placement and walker position with gait.  25 ft. X 2 and 30 ft. X 2 with pivot turn of 90 degrees to sit in chair, then 60 ft. x1 with chair pulled behind on last gait rep only to work on max gait distance; Pt presents with decreased stance time on Left, decreased step length on Right, with forward trunk flexed.   Transfer- 90 degree turns x4 reps after gait with RW/prosthesis with  VCs to center with seat and reach back for chair. min guard to min assist needed (increased assistance as pt fatigued).Pt needed assist for balance and walker management. Demo'd safe technique of reaching back to chair each time and was able to unlock prosthesis after sitting each time. Did need cues for prosthetic placement prior to sitting x1 with session. Cues to ensure lock mechanism is in correct position prior to standing. Min guard to min assist for sit<>stands with UE support and arm rests with session. Increased assistance as pt fatigued.   [facility] Adult PT Treatment/Exercise - 04/16/18 0001      Prosthetics   Current prosthetic wear tolerance (days/week)   daily    Current prosthetic wear tolerance (#hours/day)   4-5 hours 2x day    Residual limb condition   intact with no issues.     Education Provided  Proper weight-bearing schedule/adjustment;Residual limb care;Correct ply sock adjustment    Person(s) Educated  Patient;Caregiver(s)    Education Method  Explanation;Demonstration;Verbal cues    Education Method  Verbalized understanding;Needs further instruction;Verbal cues required    [employer] Prosthesis  Supervision    Doffing Prosthesis  Supervision          PT Short Term Goals - 04/08/18 0705      PT SHORT TERM GOAL #1   Title  Patient correctly donnes prosthesis without assistance or cues. (All updated STGs Target Date 05/02/2018)    Time  4    Period  Weeks    Status  New     Target Date  05/02/18      PT SHORT TERM GOAL #2   Title  Patient tolerates daily prosthesis wear >10hrs total / day.     Time  4    Period  Weeks    Status  New    Target Date  05/02/18      PT SHORT TERM GOAL #3   Title  Patient sit to/from stand from chairs without armrests to rolling walker with prosthesis with supervision.     Time  4    Period  Weeks    Status  New    Target Date  05/02/18      PT SHORT TERM GOAL #  4   Title  Patient ambulates 85' with rolling walker & locked prosthesis with min guard.    Time  4    Period  Weeks    Status  New    Target Date  05/02/18      PT SHORT TERM GOAL #5   Title  Standing balance with RW reaching 5" anteriorly and within 10" of floor with supervision.     Time  4    Period  Weeks    Status  New    Target Date  05/02/18        PT Long Term Goals - 03/13/18 1058      PT LONG TERM GOAL #1   Title  Patient & sister verbalize & demonstrate understanding of proper prosthetic care to enable safe use of prosthesis.  (All LTG Target Dates:  05/30/2018)    Time  12    Period  Weeks    Status  On-going    Target Date  05/30/18      PT LONG TERM GOAL #2   Title  Patient tolerates wear of Transfemoral prosthesis >75% of awake hours without skin issues or limb pain.     Time  12    Period  Weeks    Status  On-going    Target Date  05/30/18      PT LONG TERM GOAL #3   Title  Standing balance with RW support: reaching 10" anteriorly, picks up object from floor, scans environment & manages clothes for toileting modified indepenent.     Time  12    Period  Weeks    Status  On-going    Target Date  05/30/18      PT LONG TERM GOAL #4   Title  Patient ambulates 200' with RW & prosthesis with supervision for limited community mobility.     Time  12    Period  Weeks    Status  On-going    Target Date  05/30/18      PT LONG TERM GOAL #5   Title  Patient ambulates 31' around furniture with RW & prosthesis modified independent for  household mobility.     Time  12    Period  Weeks    Status  On-going    Target Date  05/30/18      PT LONG TERM GOAL #6   Title  Patient negotiates stairs with 2 rails, ramps & curbs with RW & prosthesis with minimal assist for community access.     Time  12    Period  Weeks    Status  On-going    Target Date  05/30/18            Plan - 04/15/18 1018    Clinical Impression Statement  Today's session focused on gait training with prosthesis with shoe with new toe cap and continued work on posture. Pt. would benefit from continued PT to address remaining gait difficulty, postural deficits, and improved transfer techniques to and from vehicles while wearing the prosthesis.    Rehab Potential  Good    Clinical Impairments Affecting Rehab Potential  pt unable to read / write and sister who is caregiver reports limited ability to read / write    PT Frequency  3x / week 3x/wk for 4 weeks then 2x/wk for 8 weeks    PT Duration  12 weeks    PT Treatment/Interventions  ADLs/Self Care Home Management;DME Instruction;Gait training;Stair training;Functional mobility training;Therapeutic activities;Therapeutic  exercise;Balance training;Neuromuscular re-education;Patient/family education;Prosthetic Training;Passive range of motion;Vestibular;Canalith Repostioning    PT Next Visit Plan  work towards updated STGs;  prosthetic care, work on car transfers while wearing the prosthesis, prosthetic gait with RW    Consulted and Agree with Plan of Care  Patient;Family member/caregiver    Family Member Consulted  Niece, [name]        Patient will benefit from skilled therapeutic intervention in order to improve the following deficits and impairments:  Abnormal gait, Decreased activity tolerance, Decreased balance, Decreased coordination, Decreased endurance, Decreased knowledge of use of DME, Decreased mobility, Decreased range of motion, Decreased scar mobility, Decreased strength, Impaired flexibility,  Postural dysfunction, Obesity, Prosthetic Dependency  Visit Diagnosis: Muscle weakness (generalized)  Unsteadiness on feet  Other abnormalities of gait and mobility  Abnormal posture  Stiffness of left hip, not elsewhere classified     Problem List Patient Active Problem List   Diagnosis Date Noted  . Atherosclerosis of native arteries of the extremities with gangrene ([place]) 11/01/2017  . Unilateral AKA, left ([place])   . Post-operative pain   . OSA (obstructive sleep apnea)   . Benign essential HTN   . Diabetes mellitus type 2 in obese ([place])   . Hyperkalemia   . Leukocytosis   . Acute blood loss anemia   . Amputation stump infection ([place]) 08/23/2017  . Amputation of left lower extremity above knee upon examination ([place]) 08/23/2017  . PAD (peripheral artery disease) ([place]) 07/23/2017  . Cellulitis 06/10/2017  . Hyponatremia 06/10/2017  . Fever 06/10/2017  . Rash 05/24/2017  . Diabetes ([place])   . HTN (hypertension)   . Allergic rhinitis   . Insomnia   . Depression     Arthor Captain, SPTA 04/15/2018, 12:09 PM  [facility] [address] [place], [place], [address] Phone: [phone]   Fax:  [phone]  Name: Alaysia Lightle MRN: [id number] Date of Birth: 01/02/1956  This note has been reviewed and edited by supervising CI.  Willow Ora, PTA, [facility] [address], [place], [place] [address] [phone] 04/16/18, 11:04 AM

## 2018-04-18 ENCOUNTER — Encounter: Payer: Self-pay | Admitting: Physical Therapy

## 2018-04-18 ENCOUNTER — Ambulatory Visit: Payer: Medicare HMO | Admitting: Physical Therapy

## 2018-04-18 DIAGNOSIS — M6281 Muscle weakness (generalized): Secondary | ICD-10-CM

## 2018-04-18 DIAGNOSIS — R2689 Other abnormalities of gait and mobility: Secondary | ICD-10-CM

## 2018-04-18 DIAGNOSIS — R293 Abnormal posture: Secondary | ICD-10-CM

## 2018-04-18 DIAGNOSIS — R2681 Unsteadiness on feet: Secondary | ICD-10-CM

## 2018-04-18 DIAGNOSIS — M25652 Stiffness of left hip, not elsewhere classified: Secondary | ICD-10-CM | POA: Diagnosis not present

## 2018-04-18 NOTE — Therapy (Signed)
[facility] [address] [place], [place], [address] Phone: [phone]   Fax:  [phone]  Physical Therapy Treatment  Patient Details  Name: Danielle Buchanan MRN: [id number] Date of Birth: May 22, 1956 Referring Provider: Maurice Small, MD   Encounter Date: 04/18/2018  PT End of Session - 04/18/18 1022    Visit Number  14    Number of Visits  29    Date for PT Re-Evaluation  06/03/18    Authorization Type  [employer]    Authorization Time Period  $3400 OOP max has been met prior to PT evaluation. Visit limit Medicare guidelines    PT Start Time  1016    PT Stop Time  1053    PT Time Calculation (min)  37 min    Equipment Utilized During Treatment  Gait belt    Activity Tolerance  Patient tolerated treatment well;No increased pain    Behavior During Therapy  WFL for tasks assessed/performed       Past Medical History:  Diagnosis Date  . Allergic rhinitis   . Depression   . Diabetes ([place])   . Dyslipidemia   . HTN (hypertension)   . Insomnia   . OSA (obstructive sleep apnea)    no cpap  . Urinary incontinence     Past Surgical History:  Procedure Laterality Date  . ABDOMINAL AORTOGRAM W/LOWER EXTREMITY N/A 06/24/2017   Procedure: ABDOMINAL AORTOGRAM W/LOWER EXTREMITY;  Surgeon: Waynetta Sandy, MD;  Location: [facility];  Service: Cardiovascular;  Laterality: N/A;  . ABDOMINAL AORTOGRAM W/LOWER EXTREMITY Right 10/09/2017   Procedure: ABDOMINAL AORTOGRAM W/LOWER EXTREMITY;  Surgeon: Waynetta Sandy, MD;  Location: [facility];  Service: Cardiovascular;  Laterality: Right;  . AMPUTATION Left 07/23/2017   Procedure: AMPUTATION  BELOW KNEE;  Surgeon: Waynetta Sandy, MD;  Location: [facility];  Service: Vascular;  Laterality: Left;  . AMPUTATION Left 08/12/2017   Procedure: REVISION BELOW KNEE;  Surgeon: Waynetta Sandy, MD;  Location: [facility];  Service: Vascular;   Laterality: Left;  . AMPUTATION Left 08/27/2017   Procedure: left ABOVE KNEE AMPUTATION;  Surgeon: Waynetta Sandy, MD;  Location: [facility];  Service: Vascular;  Laterality: Left;  . AMPUTATION Left 08/30/2017   Procedure: REVISION AMPUTATION ABOVE KNEE;  Surgeon: Serafina Mitchell, MD;  Location: [facility];  Service: Vascular;  Laterality: Left;  . AMPUTATION Right 11/04/2017   Procedure: AMPUTATION RIGHT FOURTH TOE;  Surgeon: Waynetta Sandy, MD;  Location: [facility];  Service: Vascular;  Laterality: Right;  . APPLICATION OF WOUND VAC Left 08/27/2017   Procedure: APPLICATION OF WOUND VAC;  Surgeon: Waynetta Sandy, MD;  Location: [facility];  Service: Vascular;  Laterality: Left;  . CARPAL TUNNEL RELEASE Right   . COLONOSCOPY    . LOWER EXTREMITY INTERVENTION Right 10/09/2017   Procedure: LOWER EXTREMITY INTERVENTION;  Surgeon: Waynetta Sandy, MD;  Location: [facility];  Service: Cardiovascular;  Laterality: Right;    There were no vitals filed for this visit.  Subjective Assessment - 04/18/18 1021    Subjective  Pt. reports that she is scheduled for surgery for her Right wrist at the end of the month and will be down for 2-3 weeks. Niece is concerned about how to handle mobility and transfers during that time.    Patient is accompained by:  Family member niece [name]    Pertinent History  left TFA, right 4th toe amputation, DM2, PAD, HTN,  right carpal tunnel release,    Limitations  Lifting;Standing;Walking;House hold activities    Patient Stated Goals  Walk with prosthesis in home & in community    Currently in Pain?  No/denies    Pain Onset  More than a month ago    Multiple Pain Sites  No       [facility] Adult PT Treatment/Exercise - 04/18/18 1123      Transfers   Transfers  Sit to Stand;Stand to Sit    Sit to Stand  4: Min guard;5: Supervision;With upper extremity assist;With armrests;From chair/3-in-1    Sit to Stand Details  Verbal cues for  sequencing;Verbal cues for technique;Verbal cues for precautions/safety;Verbal cues for safe use of DME/AE;Manual facilitation for weight shifting    Stand to Sit  4: Min guard;5: Supervision;With upper extremity assist;With armrests;To chair/3-in-1    Stand to Sit Details (indicate cue type and reason)  Verbal cues for sequencing;Verbal cues for technique;Verbal cues for safe use of DME/AE;Manual facilitation for weight shifting    Stand Pivot Transfers  4: Min assist;Other (comment) Car transfer x3, 2 by PTA, 1 by niece    Stand Pivot Transfer Details (indicate cue type and reason)  PTA gave cues for sequencing of car transfer and pt. and niece demo'd and verbalized understanding.      Ambulation/Gait   Ambulation/Gait  Yes    Ambulation/Gait Assistance  4: Min assist    Ambulation/Gait Assistance Details  verbal & tactile cues on posture, weight shift over prosthesis in stance and turning to position to sit 90* & 180*    Ambulation Distance (Feet)  120 Feet total- 30 feet x 4 reps   Assistive device  Rolling walker    Gait Pattern  Step-to pattern;Decreased step length - right;Decreased stance time - left;Decreased stride length;Decreased weight shift to left;Left circumduction;Left hip hike;Antalgic;Lateral hip instability;Trunk flexed;Abducted - left;Poor foot clearance - left;Poor foot clearance - right    Ambulation Surface  Indoor;Level      Prosthetics   Current prosthetic wear tolerance (days/week)   daily    Current prosthetic wear tolerance (#hours/day)   4-5 hours 2x day    Residual limb condition   intact with no issues.     Education Provided  Proper weight-bearing schedule/adjustment;Residual limb care;Correct ply sock adjustment    Person(s) Educated  Patient;Caregiver(s)    Education Method  Explanation;Demonstration;Tactile cues;Verbal cues    Education Method  Verbalized understanding;Returned demonstration;Tactile cues required;Verbal cues required;Needs further instruction     Donning Prosthesis  Supervision    Doffing Prosthesis  Supervision      Trialed hand orthosis on RW with no improvement in pain reported. Then wrapped handle with Coban to increase handle width with patient reporting improved comfort this way. Pt and niece report having Coban at home.    PT Short Term Goals - 04/08/18 0705      PT SHORT TERM GOAL #1   Title  Patient correctly donnes prosthesis without assistance or cues. (All updated STGs Target Date 05/02/2018)    Time  4    Period  Weeks    Status  New    Target Date  05/02/18      PT SHORT TERM GOAL #2   Title  Patient tolerates daily prosthesis wear >10hrs total / day.     Time  4    Period  Weeks    Status  New    Target Date  05/02/18      PT SHORT TERM  GOAL #3   Title  Patient sit to/from stand from chairs without armrests to rolling walker with prosthesis with supervision.     Time  4    Period  Weeks    Status  New    Target Date  05/02/18      PT SHORT TERM GOAL #4   Title  Patient ambulates 25' with rolling walker & locked prosthesis with min guard.    Time  4    Period  Weeks    Status  New    Target Date  05/02/18      PT SHORT TERM GOAL #5   Title  Standing balance with RW reaching 5" anteriorly and within 10" of floor with supervision.     Time  4    Period  Weeks    Status  New    Target Date  05/02/18        PT Long Term Goals - 03/13/18 1058      PT LONG TERM GOAL #1   Title  Patient & sister verbalize & demonstrate understanding of proper prosthetic care to enable safe use of prosthesis.  (All LTG Target Dates:  05/30/2018)    Time  12    Period  Weeks    Status  On-going    Target Date  05/30/18      PT LONG TERM GOAL #2   Title  Patient tolerates wear of Transfemoral prosthesis >75% of awake hours without skin issues or limb pain.     Time  12    Period  Weeks    Status  On-going    Target Date  05/30/18      PT LONG TERM GOAL #3   Title  Standing balance with RW support: reaching  10" anteriorly, picks up object from floor, scans environment & manages clothes for toileting modified indepenent.     Time  12    Period  Weeks    Status  On-going    Target Date  05/30/18      PT LONG TERM GOAL #4   Title  Patient ambulates 200' with RW & prosthesis with supervision for limited community mobility.     Time  12    Period  Weeks    Status  On-going    Target Date  05/30/18      PT LONG TERM GOAL #5   Title  Patient ambulates 61' around furniture with RW & prosthesis modified independent for household mobility.     Time  12    Period  Weeks    Status  On-going    Target Date  05/30/18      PT LONG TERM GOAL #6   Title  Patient negotiates stairs with 2 rails, ramps & curbs with RW & prosthesis with minimal assist for community access.     Time  12    Period  Weeks    Status  On-going    Target Date  05/30/18        Plan - 04/18/18 1118    Clinical Impression Statement  Today's session focused on continued gait training with prosthesis over further distances and car transfers while wearing the prosthesis. Pt. would continue to benefit from PT to address postural and gait deficits and improve skill with car transfers while wearing her prosthesis.     Rehab Potential  Good    Clinical Impairments Affecting Rehab Potential  pt unable to read / write and sister who  is caregiver reports limited ability to read / write    PT Frequency  3x / week 3x/wk for 4 weeks then 2x/wk for 8 weeks    PT Duration  12 weeks    PT Treatment/Interventions  ADLs/Self Care Home Management;DME Instruction;Gait training;Stair training;Functional mobility training;Therapeutic activities;Therapeutic exercise;Balance training;Neuromuscular re-education;Patient/family education;Prosthetic Training;Passive range of motion;Vestibular;Canalith Repostioning    PT Next Visit Plan  work towards updated STGs;  prosthetic care, ask about car transfers while wearing the prosthesis and if family needs any  additional practice, prosthetic gait with RW (wrap right handle with coban)    Consulted and Agree with Plan of Care  Patient;Family member/caregiver    Family Member Consulted  Niece, [name]        Patient will benefit from skilled therapeutic intervention in order to improve the following deficits and impairments:  Abnormal gait, Decreased activity tolerance, Decreased balance, Decreased coordination, Decreased endurance, Decreased knowledge of use of DME, Decreased mobility, Decreased range of motion, Decreased scar mobility, Decreased strength, Impaired flexibility, Postural dysfunction, Obesity, Prosthetic Dependency  Visit Diagnosis: Muscle weakness (generalized)  Unsteadiness on feet  Other abnormalities of gait and mobility  Abnormal posture  Stiffness of left hip, not elsewhere classified     Problem List Patient Active Problem List   Diagnosis Date Noted  . Atherosclerosis of native arteries of the extremities with gangrene ([place]) 11/01/2017  . Unilateral AKA, left ([place])   . Post-operative pain   . OSA (obstructive sleep apnea)   . Benign essential HTN   . Diabetes mellitus type 2 in obese ([place])   . Hyperkalemia   . Leukocytosis   . Acute blood loss anemia   . Amputation stump infection ([place]) 08/23/2017  . Amputation of left lower extremity above knee upon examination ([place]) 08/23/2017  . PAD (peripheral artery disease) ([place]) 07/23/2017  . Cellulitis 06/10/2017  . Hyponatremia 06/10/2017  . Fever 06/10/2017  . Rash 05/24/2017  . Diabetes ([place])   . HTN (hypertension)   . Allergic rhinitis   . Insomnia   . Depression     Arthor Captain, [place] 04/18/2018, 11:33 AM  [facility] [address] [place], [place], [address] Phone: [phone]   Fax:  [phone]  Name: Arlenis Blaydes MRN: [id number] Date of Birth: 07-27-1956

## 2018-04-21 ENCOUNTER — Encounter: Payer: Self-pay | Admitting: Physical Therapy

## 2018-04-21 ENCOUNTER — Ambulatory Visit: Payer: Medicare HMO | Admitting: Physical Therapy

## 2018-04-21 DIAGNOSIS — R2681 Unsteadiness on feet: Secondary | ICD-10-CM

## 2018-04-21 DIAGNOSIS — R293 Abnormal posture: Secondary | ICD-10-CM | POA: Diagnosis not present

## 2018-04-21 DIAGNOSIS — M6281 Muscle weakness (generalized): Secondary | ICD-10-CM | POA: Diagnosis not present

## 2018-04-21 DIAGNOSIS — R2689 Other abnormalities of gait and mobility: Secondary | ICD-10-CM

## 2018-04-21 DIAGNOSIS — M25652 Stiffness of left hip, not elsewhere classified: Secondary | ICD-10-CM | POA: Diagnosis not present

## 2018-04-21 NOTE — Therapy (Signed)
[facility] [address] [place], [place], [address] Phone: [phone]   Fax:  [phone]  Physical Therapy Treatment  Patient Details  Name: Danielle Buchanan MRN: [id number] Date of Birth: [birth year] Referring Provider: Maurice Small, MD   Encounter Date: 04/21/2018  PT End of Session - 04/21/18 1101    Visit Number  15    Number of Visits  29    Date for PT Re-Evaluation  06/03/18    Authorization Type  [employer]    Authorization Time Period  $3400 OOP max has been met prior to PT evaluation. Visit limit Medicare guidelines    PT Start Time  [phone]    PT Stop Time  1013    PT Time Calculation (min)  42 min    Equipment Utilized During Treatment  Gait belt    Activity Tolerance  Patient tolerated treatment well;No increased pain    Behavior During Therapy  WFL for tasks assessed/performed       Past Medical History:  Diagnosis Date  . Allergic rhinitis   . Depression   . Diabetes ([place])   . Dyslipidemia   . HTN (hypertension)   . Insomnia   . OSA (obstructive sleep apnea)    no cpap  . Urinary incontinence     Past Surgical History:  Procedure Laterality Date  . ABDOMINAL AORTOGRAM W/LOWER EXTREMITY N/A 06/24/2017   Procedure: ABDOMINAL AORTOGRAM W/LOWER EXTREMITY;  Surgeon: Waynetta Sandy, MD;  Location: [facility];  Service: Cardiovascular;  Laterality: N/A;  . ABDOMINAL AORTOGRAM W/LOWER EXTREMITY Right 10/09/2017   Procedure: ABDOMINAL AORTOGRAM W/LOWER EXTREMITY;  Surgeon: Waynetta Sandy, MD;  Location: [facility];  Service: Cardiovascular;  Laterality: Right;  . AMPUTATION Left 07/23/2017   Procedure: AMPUTATION  BELOW KNEE;  Surgeon: Waynetta Sandy, MD;  Location: [facility];  Service: Vascular;  Laterality: Left;  . AMPUTATION Left 08/12/2017   Procedure: REVISION BELOW KNEE;  Surgeon: Waynetta Sandy, MD;  Location: [facility];  Service: Vascular;   Laterality: Left;  . AMPUTATION Left 08/27/2017   Procedure: left ABOVE KNEE AMPUTATION;  Surgeon: Waynetta Sandy, MD;  Location: [facility];  Service: Vascular;  Laterality: Left;  . AMPUTATION Left 08/30/2017   Procedure: REVISION AMPUTATION ABOVE KNEE;  Surgeon: Serafina Mitchell, MD;  Location: [facility];  Service: Vascular;  Laterality: Left;  . AMPUTATION Right 11/04/2017   Procedure: AMPUTATION RIGHT FOURTH TOE;  Surgeon: Waynetta Sandy, MD;  Location: [facility];  Service: Vascular;  Laterality: Right;  . APPLICATION OF WOUND VAC Left 08/27/2017   Procedure: APPLICATION OF WOUND VAC;  Surgeon: Waynetta Sandy, MD;  Location: [facility];  Service: Vascular;  Laterality: Left;  . CARPAL TUNNEL RELEASE Right   . COLONOSCOPY    . LOWER EXTREMITY INTERVENTION Right 10/09/2017   Procedure: LOWER EXTREMITY INTERVENTION;  Surgeon: Waynetta Sandy, MD;  Location: [facility];  Service: Cardiovascular;  Laterality: Right;    There were no vitals filed for this visit.  Subjective Assessment - 04/21/18 0936    Subjective  She has been walking at house with niece or nephew. The Coban & toe cap are helping.     Patient is accompained by:  Family member niece [name]    Pertinent History  left TFA, right 4th toe amputation, DM2, PAD, HTN, right carpal tunnel release,    Limitations  Lifting;Standing;Walking;House hold activities    Patient Stated Goals  Walk  with prosthesis in home & in community    Currently in Pain?  No/denies    Pain Onset  More than a month ago                       [facility] Adult PT Treatment/Exercise - 04/21/18 0930      Transfers   Transfers  Sit to Stand;Stand to Sit    Sit to Stand  4: Min guard;5: Supervision;With upper extremity assist;With armrests;From chair/3-in-1 to RW    Stand to Sit  4: Min guard;5: Supervision;With upper extremity assist;With armrests;To chair/3-in-1 from RW    Stand to Sit Details (indicate cue type and  reason)  Verbal cues for sequencing;Verbal cues for technique;Verbal cues for safe use of DME/AE;Manual facilitation for weight shifting    Stand to Sit Details  verbal & demo cues on positioning prior to sitting.       Ambulation/Gait   Ambulation/Gait  Yes    Ambulation/Gait Assistance  4: Min assist;5: Supervision progressed to supervision end of session    Ambulation/Gait Assistance Details  verbal cues on upright posture & wt shift over prosthesis in stance    Ambulation Distance (Feet)  40 Feet 40' X 2     Assistive device  Rolling walker;Prosthesis coban build-up on RW right handle    Gait Pattern  Step-to pattern;Decreased step length - right;Decreased stance time - left;Decreased stride length;Decreased weight shift to left;Left circumduction;Left hip hike;Antalgic;Lateral hip instability;Trunk flexed;Abducted - left;Poor foot clearance - left;Poor foot clearance - right    Ambulation Surface  Indoor;Level    Ramp  4: Min assist RW & locked TFA prosthesis    Ramp Details (indicate cue type and reason)  demo & verbal cues before and verbal & tactile cues during on upright posture, RW control and wt shift over prosthesis in stance.     Curb  4: Min assist RW & locked TFA prosthesis    Curb Details (indicate cue type and reason)  verbal & tactile cues on sequence, RW control and technique      Prosthetics   Prosthetic Care Comments   Increase wear to all awake hours except 2 hrs mid-day    Current prosthetic wear tolerance (days/week)   daily    Current prosthetic wear tolerance (#hours/day)   5 hours 2x day    Residual limb condition   intact with no issues.     Education Provided  Proper weight-bearing schedule/adjustment;Residual limb care;Correct ply sock adjustment    Person(s) Educated  Patient;Caregiver(s)    Education Method  Explanation;Verbal cues    Education Method  Verbalized understanding;Verbal cues required;Needs further instruction               PT Short  Term Goals - 04/08/18 0705      PT SHORT TERM GOAL #1   Title  Patient correctly donnes prosthesis without assistance or cues. (All updated STGs Target Date 05/02/2018)    Time  4    Period  Weeks    Status  New    Target Date  05/02/18      PT SHORT TERM GOAL #2   Title  Patient tolerates daily prosthesis wear >10hrs total / day.     Time  4    Period  Weeks    Status  New    Target Date  05/02/18      PT SHORT TERM GOAL #3   Title  Patient sit to/from stand from  chairs without armrests to rolling walker with prosthesis with supervision.     Time  4    Period  Weeks    Status  New    Target Date  05/02/18      PT SHORT TERM GOAL #4   Title  Patient ambulates 20' with rolling walker & locked prosthesis with min guard.    Time  4    Period  Weeks    Status  New    Target Date  05/02/18      PT SHORT TERM GOAL #5   Title  Standing balance with RW reaching 5" anteriorly and within 10" of floor with supervision.     Time  4    Period  Weeks    Status  New    Target Date  05/02/18        PT Long Term Goals - 04/21/18 1157      PT LONG TERM GOAL #1   Title  Patient & sister verbalize & demonstrate understanding of proper prosthetic care to enable safe use of prosthesis.  (All LTG Target Dates:  05/30/2018)    Time  12    Period  Weeks    Status  On-going    Target Date  05/30/18      PT LONG TERM GOAL #2   Title  Patient tolerates wear of Transfemoral prosthesis >75% of awake hours without skin issues or limb pain.     Time  12    Period  Weeks    Status  On-going    Target Date  05/30/18      PT LONG TERM GOAL #3   Title  Standing balance with RW support: reaching 10" anteriorly, picks up object from floor, scans environment & manages clothes for toileting modified indepenent.     Time  12    Period  Weeks    Status  On-going    Target Date  05/30/18      PT LONG TERM GOAL #4   Title  Patient ambulates 200' with RW & prosthesis with supervision for limited  community mobility.     Time  12    Period  Weeks    Status  On-going    Target Date  05/30/18      PT LONG TERM GOAL #5   Title  Patient ambulates 40' around furniture with RW & prosthesis modified independent for household mobility.     Time  12    Period  Weeks    Status  On-going    Target Date  05/30/18      PT LONG TERM GOAL #6   Title  Patient negotiates stairs with 2 rails, ramps & curbs with RW & prosthesis with minimal assist for community access.     Time  12    Period  Weeks    Status  On-going    Target Date  05/30/18            Plan - 04/21/18 1158    Clinical Impression Statement  Today's skilled session introduced ramps & curbs technique with prosthesis. Patient did well with minA & constant verbal cues on technique.     Rehab Potential  Good    Clinical Impairments Affecting Rehab Potential  pt unable to read / write and sister who is caregiver reports limited ability to read / write    PT Frequency  3x / week 3x/wk for 4 weeks then 2x/wk for 8 weeks  PT Duration  12 weeks    PT Treatment/Interventions  ADLs/Self Care Home Management;DME Instruction;Gait training;Stair training;Functional mobility training;Therapeutic activities;Therapeutic exercise;Balance training;Neuromuscular re-education;Patient/family education;Prosthetic Training;Passive range of motion;Vestibular;Canalith Repostioning    PT Next Visit Plan  work towards updated STGs;  prosthetic care, ask about car transfers while wearing the prosthesis and if family needs any additional practice, prosthetic gait with RW (wrap right handle with coban)    Consulted and Agree with Plan of Care  Patient;Family member/caregiver    Family Member Consulted  Niece, [name]        Patient will benefit from skilled therapeutic intervention in order to improve the following deficits and impairments:  Abnormal gait, Decreased activity tolerance, Decreased balance, Decreased coordination, Decreased endurance,  Decreased knowledge of use of DME, Decreased mobility, Decreased range of motion, Decreased scar mobility, Decreased strength, Impaired flexibility, Postural dysfunction, Obesity, Prosthetic Dependency  Visit Diagnosis: Muscle weakness (generalized)  Unsteadiness on feet  Other abnormalities of gait and mobility  Abnormal posture     Problem List Patient Active Problem List   Diagnosis Date Noted  . Atherosclerosis of native arteries of the extremities with gangrene ([place]) 11/01/2017  . Unilateral AKA, left ([place])   . Post-operative pain   . OSA (obstructive sleep apnea)   . Benign essential HTN   . Diabetes mellitus type 2 in obese ([place])   . Hyperkalemia   . Leukocytosis   . Acute blood loss anemia   . Amputation stump infection ([place]) 08/23/2017  . Amputation of left lower extremity above knee upon examination ([place]) 08/23/2017  . PAD (peripheral artery disease) ([place]) 07/23/2017  . Cellulitis 06/10/2017  . Hyponatremia 06/10/2017  . Fever 06/10/2017  . Rash 05/24/2017  . Diabetes ([place])   . HTN (hypertension)   . Allergic rhinitis   . Insomnia   . Depression     Temima Kutsch PT, DPT 04/21/2018, 12:00 PM  [facility] [address] [place], [place], [address] Phone: [phone]   Fax:  [phone]  Name: Danielle Buchanan MRN: [id number] Date of Birth: December 07, 1955

## 2018-04-24 ENCOUNTER — Encounter: Payer: Self-pay | Admitting: Physical Therapy

## 2018-04-24 ENCOUNTER — Ambulatory Visit: Payer: Medicare HMO | Admitting: Physical Therapy

## 2018-04-24 DIAGNOSIS — R2689 Other abnormalities of gait and mobility: Secondary | ICD-10-CM | POA: Diagnosis not present

## 2018-04-24 DIAGNOSIS — M6281 Muscle weakness (generalized): Secondary | ICD-10-CM

## 2018-04-24 DIAGNOSIS — M25652 Stiffness of left hip, not elsewhere classified: Secondary | ICD-10-CM | POA: Diagnosis not present

## 2018-04-24 DIAGNOSIS — R2681 Unsteadiness on feet: Secondary | ICD-10-CM | POA: Diagnosis not present

## 2018-04-24 DIAGNOSIS — R293 Abnormal posture: Secondary | ICD-10-CM | POA: Diagnosis not present

## 2018-04-25 ENCOUNTER — Encounter: Payer: Self-pay | Admitting: Family

## 2018-04-25 ENCOUNTER — Ambulatory Visit (INDEPENDENT_AMBULATORY_CARE_PROVIDER_SITE_OTHER)
Admission: RE | Admit: 2018-04-25 | Discharge: 2018-04-25 | Disposition: A | Discharge status: Home / Self Care | Payer: Medicare HMO | Source: Ambulatory Visit | Attending: Vascular Surgery | Admitting: Vascular Surgery

## 2018-04-25 ENCOUNTER — Ambulatory Visit: Payer: Medicare HMO | Admitting: Family

## 2018-04-25 ENCOUNTER — Ambulatory Visit (HOSPITAL_COMMUNITY)
Admission: RE | Admit: 2018-04-25 | Discharge: 2018-04-25 | Disposition: A | Discharge status: Home / Self Care | Payer: Medicare HMO | Source: Ambulatory Visit | Attending: Vascular Surgery | Admitting: Vascular Surgery

## 2018-04-25 VITALS — BP 135/85 | HR 81 | Temp 97.7°F | Resp 16

## 2018-04-25 DIAGNOSIS — Z89612 Acquired absence of left leg above knee: Secondary | ICD-10-CM

## 2018-04-25 DIAGNOSIS — I998 Other disorder of circulatory system: Secondary | ICD-10-CM

## 2018-04-25 DIAGNOSIS — Z89512 Acquired absence of left leg below knee: Secondary | ICD-10-CM

## 2018-04-25 DIAGNOSIS — I70261 Atherosclerosis of native arteries of extremities with gangrene, right leg: Secondary | ICD-10-CM

## 2018-04-25 DIAGNOSIS — F17200 Nicotine dependence, unspecified, uncomplicated: Secondary | ICD-10-CM

## 2018-04-25 DIAGNOSIS — I739 Peripheral vascular disease, unspecified: Secondary | ICD-10-CM | POA: Diagnosis not present

## 2018-04-25 DIAGNOSIS — I70229 Atherosclerosis of native arteries of extremities with rest pain, unspecified extremity: Secondary | ICD-10-CM

## 2018-04-25 DIAGNOSIS — E785 Hyperlipidemia, unspecified: Secondary | ICD-10-CM | POA: Insufficient documentation

## 2018-04-25 DIAGNOSIS — I779 Disorder of arteries and arterioles, unspecified: Secondary | ICD-10-CM

## 2018-04-25 DIAGNOSIS — T8789 Other complications of amputation stump: Secondary | ICD-10-CM

## 2018-04-25 DIAGNOSIS — E1151 Type 2 diabetes mellitus with diabetic peripheral angiopathy without gangrene: Secondary | ICD-10-CM | POA: Diagnosis not present

## 2018-04-25 DIAGNOSIS — I10 Essential (primary) hypertension: Secondary | ICD-10-CM | POA: Insufficient documentation

## 2018-04-25 DIAGNOSIS — Z89421 Acquired absence of other right toe(s): Secondary | ICD-10-CM

## 2018-04-25 NOTE — Progress Notes (Signed)
[facility] [place]   CC: Follow up peripheral artery occlusive disease  History of Present Illness Danielle Buchanan is a 62 y.o. female who is s/p right 4th toe amputation on 11-04-17 by [name] for necrotic right fourth toe. Shehas undergone revascularization of her right lower extremity as well as a left-sided above-knee amputation.  Pt returned on 11-22-17 for 2-3 week postoperative visit.   She returned on 01-03-18 as an urgent referral from Dr. [name] with report of cool toes of right foot. Pt denies fever or chills, denies pain in right foot.  She walks well with her walker.   She is receiving physical therapy with her prosthesis.   Pt sister died recently and pt resumed smoking.    Diabetic: Yes, uncontrolled, A1C was 8.1 on 11-04-17, 10.7 on 03-11-18 Tobacco use: smoker  (3-4 cigs/day x 51 yrs)  Pt meds include: Statin :Yes Betablocker: No ASA: Yes Other anticoagulants/antiplatelets: Plavix  Past Medical History:  Diagnosis Date  . Allergic rhinitis   . Depression   . Diabetes (HCC)   . Dyslipidemia   . HTN (hypertension)   . Insomnia   . OSA (obstructive sleep apnea)    no cpap  . Urinary incontinence     Social History Social History   Tobacco Use  . Smoking status: Former Smoker    Packs/day: 1.00    Years: 51.00    Pack years: 51.00    Types: Cigarettes    Last attempt to quit: 06/08/2017    Years since quitting: 0.8  . Smokeless tobacco: Never Used  Substance Use Topics  . Alcohol use: No    Alcohol/week: 0.0 oz  . Drug use: No    Family History Family History  Problem Relation Age of Onset  . Heart attack Mother   . Alcohol abuse Mother   . Heart attack Father   . Alcohol abuse Father   . Heart attack Sister   . Heart attack Brother   . Hypercholesterolemia Sister   . Diabetes Neg Hx     Past Surgical History:  Procedure Laterality Date  . ABDOMINAL AORTOGRAM W/LOWER EXTREMITY N/A 06/24/2017   Procedure:  ABDOMINAL AORTOGRAM W/LOWER EXTREMITY;  Surgeon: Maeola Harman, MD;  Location: [facility] INVASIVE CV LAB;  Service: Cardiovascular;  Laterality: N/A;  . ABDOMINAL AORTOGRAM W/LOWER EXTREMITY Right 10/09/2017   Procedure: ABDOMINAL AORTOGRAM W/LOWER EXTREMITY;  Surgeon: Maeola Harman, MD;  Location: [facility] INVASIVE CV LAB;  Service: Cardiovascular;  Laterality: Right;  . AMPUTATION Left 07/23/2017   Procedure: AMPUTATION  BELOW KNEE;  Surgeon: Maeola Harman, MD;  Location: [facility] OR;  Service: Vascular;  Laterality: Left;  . AMPUTATION Left 08/12/2017   Procedure: REVISION BELOW KNEE;  Surgeon: Maeola Harman, MD;  Location: [facility] OR;  Service: Vascular;  Laterality: Left;  . AMPUTATION Left 08/27/2017   Procedure: left ABOVE KNEE AMPUTATION;  Surgeon: Maeola Harman, MD;  Location: [facility] OR;  Service: Vascular;  Laterality: Left;  . AMPUTATION Left 08/30/2017   Procedure: REVISION AMPUTATION ABOVE KNEE;  Surgeon: Nada Libman, MD;  Location: [facility] OR;  Service: Vascular;  Laterality: Left;  . AMPUTATION Right 11/04/2017   Procedure: AMPUTATION RIGHT FOURTH TOE;  Surgeon: Maeola Harman, MD;  Location: [facility] OR;  Service: Vascular;  Laterality: Right;  . APPLICATION OF WOUND VAC Left 08/27/2017   Procedure: APPLICATION OF WOUND VAC;  Surgeon: Maeola Harman, MD;  Location: [facility] OR;  Service: Vascular;  Laterality:  Left;  . CARPAL TUNNEL RELEASE Right   . COLONOSCOPY    . LOWER EXTREMITY INTERVENTION Right 10/09/2017   Procedure: LOWER EXTREMITY INTERVENTION;  Surgeon: Maeola Harman, MD;  Location: [facility] INVASIVE CV LAB;  Service: Cardiovascular;  Laterality: Right;    Allergies  Allergen Reactions  . Milk-Related Compounds     incontinence  . Doxycycline Rash    Current Outpatient Medications  Medication Sig Dispense Refill  . amitriptyline (ELAVIL) 25 MG tablet Take 25 mg by mouth at bedtime.    Marland Kitchen aspirin (ASPIRIN 81) 81 MG  chewable tablet Aspir-81    . aspirin EC 325 MG tablet Take 325 mg by mouth daily.    . insulin NPH-regular Human (NOVOLIN 70/30) (70-30) 100 UNIT/ML injection 120 units with breakfast, and 90 units with supper, and syringes 3/day. 210 mL 3  . losartan (COZAAR) 100 MG tablet Take 100 mg by mouth daily.    . Melatonin 10 MG TABS Take 20 mg by mouth at bedtime.    . nicotine polacrilex (NICORELIEF) 2 MG gum Take 2 mg by mouth as needed for smoking cessation.    . pravastatin (PRAVACHOL) 20 MG tablet Take 20 mg by mouth daily.     . sertraline (ZOLOFT) 100 MG tablet Take 150 mg by mouth daily.     . traZODone (DESYREL) 50 MG tablet Take 50 mg by mouth at bedtime.    Marland Kitchen VITAMIN E PO Take 180 mg by mouth daily.    . Zinc 50 MG TABS Take 50 mg by mouth daily.    . clopidogrel (PLAVIX) 75 MG tablet Take 1 tablet (75 mg total) by mouth daily. (Patient not taking: Reported on 04/25/2018) 30 tablet 3   No current facility-administered medications for this visit.     ROS: See HPI for pertinent positives and negatives.   Physical Examination  Vitals:   04/25/18 1222  BP: 135/85  Pulse: 81  Resp: 16  Temp: 97.7 F (36.5 C)  TempSrc: Oral  SpO2: 94%   There is no height or weight on file to calculate BMI.  General: A&O x 3, WDWN, obese female. Gait: seated in her w/c HENT: No gross abnormalities.   Eyes: PERRLA. Pulmonary: Respirations are non labored, few rales in bases, fair air movement Cardiac: regular rhythm, no detected murmur.         Carotid Bruits Right Left   Negative Negative   Radial pulses are 2+ palpable bilaterally   Adominal aortic pulse is not palpable                      VASCULAR EXAM: Extremities without ischemic changes, without Gangrene; without open wounds. Right 4th toe amputation site is well healed. No signs of ischemia on right foot. Left AKA stump is well healed. Left AKA prosthesis in place.                                                                                                            LE Pulses Right Left  FEMORAL  not palpable seated in w/c, obese  not palpable        POPLITEAL  not palpable  AKA       POSTERIOR TIBIAL  not palpable   AKA        DORSALIS PEDIS      ANTERIOR TIBIAL not palpable  AKA    Abdomen: soft, NT, no palpable masses. Large panus Skin: no rashes, no cellulitis, no ulcers noted. Musculoskeletal: no muscle wasting or atrophy See Extremities  Neurologic: A&O X 3; appropriate affect, Sensation is normal; MOTOR FUNCTION:  moving all extremities equally, motor strength 5/5 throughout. Speech is fluent/normal. CN 2-12 intact. Psychiatric: Thought content is normal, mood appropriate for clinical situation.     ASSESSMENT: Danielle Buchanan is a 62 y.o. female who is s/p right 4th toe amputation on 11-04-17 by Dr. Randie Heinzain for necrotic right fourth toe. Shehas undergone revascularization of her right lower extremity as well as a left-sided above-knee amputation.  Right ABI today remins normal, excellent right great toe pressure of 126. Right 4th toe amputation site is well healed No evidence of ischemia in the right foot.  Left AKA stump is well healed, is wearing her left AKA prosthesis.   Her atherosclerotic risk factors include uncontrolled and worsening DM, resumed smoking after briefly stopping, obesity, and OSA.    DATA   Right LE Arterial Duplex (04-25-18): No significant stenosis Probable anterior tibial and peroneal occlusive disease.  Triphasic waveforms from proximal CFA to distal popliteal, monophaisc distal ATA, biphasic distal PTA, monophasic distal peroneal artery.    ABI (Date: 04/25/2018):  R:   ABI: 0.95 (was 1.22 on 01-03-18),   PT: bi  DP: bi  TBI:  0.78, toe pressure 126 (was 0.65)  L: AKA  Right ABI remains normal with biphasic waveforms. Improved right TBI.  Left AKA    PLAN:  Based on the patient's vascular studies and examination, pt will return to clinic in 3  months with right LE arterial duplex and right ABI.  I advised pt and her son to notify us if she develops concerns re the circulation in her feet or legs.   Over 3 minutes was spent counseling patient re smoking cessation, and patient was given several free resources re smoking cessation.  I discussed in depth with the patient the nature of atherosclerosis, and emphasized the importance of maximal medical management including strict control of blood pressure, blood glucose, and lipid levels, obtaining regular exercise, and cessation of smoking.  The patient is aware that without maximal medical management the underlying atherosclerotic disease process will progress, limiting the benefit of any interventions.  The patient was given information about PAD including signs, symptoms, treatment, what symptoms should prompt the patient to seek immediate medical care, and risk reduction measures to take.  Charisse MarchSuzanne Abena Erdman, RN, MSN, FNP-C [facility] [place] Office Phone: [phone]  Clinic MD: Randie HeinzCain  04/25/18 12:48 PM

## 2018-04-25 NOTE — Patient Instructions (Signed)
Steps to Quit Smoking Smoking tobacco can be bad for your health. It can also affect almost every organ in your body. Smoking puts you and people around you at risk for many serious long-lasting (chronic) diseases. Quitting smoking is hard, but it is one of the best things that you can do for your health. It is never too late to quit. What are the benefits of quitting smoking? When you quit smoking, you lower your risk for getting serious diseases and conditions. They can include:  Lung cancer or lung disease.  Heart disease.  Stroke.  Heart attack.  Not being able to have children (infertility).  Weak bones (osteoporosis) and broken bones (fractures).  If you have coughing, wheezing, and shortness of breath, those symptoms may get better when you quit. You may also get sick less often. If you are pregnant, quitting smoking can help to lower your chances of having a baby of low birth weight. What can I do to help me quit smoking? Talk with your doctor about what can help you quit smoking. Some things you can do (strategies) include:  Quitting smoking totally, instead of slowly cutting back how much you smoke over a period of time.  Going to in-person counseling. You are more likely to quit if you go to many counseling sessions.  Using resources and support systems, such as: ? Online chats with a counselor. ? Phone quitlines. ? Printed self-help materials. ? Support groups or group counseling. ? Text messaging programs. ? Mobile phone apps or applications.  Taking medicines. Some of these medicines may have nicotine in them. If you are pregnant or breastfeeding, do not take any medicines to quit smoking unless your doctor says it is okay. Talk with your doctor about counseling or other things that can help you.  Talk with your doctor about using more than one strategy at the same time, such as taking medicines while you are also going to in-person counseling. This can help make  quitting easier. What things can I do to make it easier to quit? Quitting smoking might feel very hard at first, but there is a lot that you can do to make it easier. Take these steps:  Talk to your family and friends. Ask them to support and encourage you.  Call phone quitlines, reach out to support groups, or work with a counselor.  Ask people who smoke to not smoke around you.  Avoid places that make you want (trigger) to smoke, such as: ? Bars. ? Parties. ? Smoke-break areas at work.  Spend time with people who do not smoke.  Lower the stress in your life. Stress can make you want to smoke. Try these things to help your stress: ? Getting regular exercise. ? Deep-breathing exercises. ? Yoga. ? Meditating. ? Doing a body scan. To do this, close your eyes, focus on one area of your body at a time from head to toe, and notice which parts of your body are tense. Try to relax the muscles in those areas.  Download or buy apps on your mobile phone or tablet that can help you stick to your quit plan. There are many free apps, such as QuitGuide from the CDC (Centers for Disease Control and Prevention). You can find more support from smokefree.gov and other websites.  This information is not intended to replace advice given to you by your health care provider. Make sure you discuss any questions you have with your health care provider. Document Released: 07/21/2009 Document   Revised: 05/22/2016 Document Reviewed: 02/08/2015 Elsevier Interactive Patient Education  2018 [employer] Inc.     Peripheral Vascular Disease Peripheral vascular disease (PVD) is a disease of the blood vessels that are not part of your heart and brain. A simple term for PVD is poor circulation. In most cases, PVD narrows the blood vessels that carry blood from your heart to the rest of your body. This can result in a decreased supply of blood to your arms, legs, and internal organs, like your stomach or kidneys.  However, it most often affects a person's lower legs and feet. There are two types of PVD.  Organic PVD. This is the more common type. It is caused by damage to the structure of blood vessels.  Functional PVD. This is caused by conditions that make blood vessels contract and tighten (spasm).  Without treatment, PVD tends to get worse over time. PVD can also lead to acute ischemic limb. This is when an arm or limb suddenly has trouble getting enough blood. This is a medical emergency. Follow these instructions at home:  Take medicines only as told by your doctor.  Do not use any tobacco products, including cigarettes, chewing tobacco, or electronic cigarettes. If you need help quitting, ask your doctor.  Lose weight if you are overweight, and maintain a healthy weight as told by your doctor.  Eat a diet that is low in fat and cholesterol. If you need help, ask your doctor.  Exercise regularly. Ask your doctor for some good activities for you.  Take good care of your feet. ? Wear comfortable shoes that fit well. ? Check your feet often for any cuts or sores. Contact a doctor if:  You have cramps in your legs while walking.  You have leg pain when you are at rest.  You have coldness in a leg or foot.  Your skin changes.  You are unable to get or have an erection (erectile dysfunction).  You have cuts or sores on your feet that are not healing. Get help right away if:  Your arm or leg turns cold and blue.  Your arms or legs become red, warm, swollen, painful, or numb.  You have chest pain or trouble breathing.  You suddenly have weakness in your face, arm, or leg.  You become very confused or you cannot speak.  You suddenly have a very bad headache.  You suddenly cannot see. This information is not intended to replace advice given to you by your health care provider. Make sure you discuss any questions you have with your health care provider. Document Released:  12/19/2009 Document Revised: 03/01/2016 Document Reviewed: 03/04/2014 Elsevier Interactive Patient Education  2017 [employer] Inc.  

## 2018-04-26 NOTE — Therapy (Signed)
[facility] [address] [place], [place], [address] Phone: [phone]   Fax:  [phone]  Physical Therapy Treatment  Patient Details  Name: Danielle Buchanan MRN: [id number] Date of Birth: [birth year] Referring Provider: Maurice Small, MD   Encounter Date: 04/24/2018    04/24/18 1021  PT Visits / Re-Eval  Visit Number 16  Number of Visits 29  Date for PT Re-Evaluation 06/03/18  Authorization  Authorization Type [employer]  Authorization Time Period $3400 OOP max has been met prior to PT evaluation. Visit limit Medicare guidelines  PT Time Calculation  PT Start Time 1018  PT Stop Time 1058  PT Time Calculation (min) 40 min  PT - End of Session  Equipment Utilized During Treatment Gait belt  Activity Tolerance Patient tolerated treatment well;No increased pain  Behavior During Therapy WFL for tasks assessed/performed     Past Medical History:  Diagnosis Date  . Allergic rhinitis   . Depression   . Diabetes ([place])   . Dyslipidemia   . HTN (hypertension)   . Insomnia   . OSA (obstructive sleep apnea)    no cpap  . Urinary incontinence     Past Surgical History:  Procedure Laterality Date  . ABDOMINAL AORTOGRAM W/LOWER EXTREMITY N/A 06/24/2017   Procedure: ABDOMINAL AORTOGRAM W/LOWER EXTREMITY;  Surgeon: Waynetta Sandy, MD;  Location: [facility];  Service: Cardiovascular;  Laterality: N/A;  . ABDOMINAL AORTOGRAM W/LOWER EXTREMITY Right 10/09/2017   Procedure: ABDOMINAL AORTOGRAM W/LOWER EXTREMITY;  Surgeon: Waynetta Sandy, MD;  Location: [facility];  Service: Cardiovascular;  Laterality: Right;  . AMPUTATION Left 07/23/2017   Procedure: AMPUTATION  BELOW KNEE;  Surgeon: Waynetta Sandy, MD;  Location: [facility];  Service: Vascular;  Laterality: Left;  . AMPUTATION Left 08/12/2017   Procedure: REVISION BELOW KNEE;  Surgeon: Waynetta Sandy, MD;  Location: [facility];  Service: Vascular;  Laterality: Left;  . AMPUTATION Left 08/27/2017   Procedure: left ABOVE KNEE AMPUTATION;  Surgeon: Waynetta Sandy, MD;  Location: [facility];  Service: Vascular;  Laterality: Left;  . AMPUTATION Left 08/30/2017   Procedure: REVISION AMPUTATION ABOVE KNEE;  Surgeon: Serafina Mitchell, MD;  Location: [facility];  Service: Vascular;  Laterality: Left;  . AMPUTATION Right 11/04/2017   Procedure: AMPUTATION RIGHT FOURTH TOE;  Surgeon: Waynetta Sandy, MD;  Location: [facility];  Service: Vascular;  Laterality: Right;  . APPLICATION OF WOUND VAC Left 08/27/2017   Procedure: APPLICATION OF WOUND VAC;  Surgeon: Waynetta Sandy, MD;  Location: [facility];  Service: Vascular;  Laterality: Left;  . CARPAL TUNNEL RELEASE Right   . COLONOSCOPY    . LOWER EXTREMITY INTERVENTION Right 10/09/2017   Procedure: LOWER EXTREMITY INTERVENTION;  Surgeon: Waynetta Sandy, MD;  Location: [facility];  Service: Cardiovascular;  Laterality: Right;    There were no vitals filed for this visit.     04/24/18 1020  Symptoms/Limitations  Subjective No new complaints. No falls. Does report she is tired today as she did not sleep well last night, reports not getting to sleep until 6 am today.   Patient is accompained by: Family member  Pertinent History left TFA, right 4th toe amputation, DM2, PAD, HTN, right carpal tunnel release,  Limitations Lifting;Standing;Walking;House hold activities  Patient Stated Goals Walk with prosthesis in home & in community  Pain Assessment  Currently in Pain? No/denies  Pain Score 0      04/24/18  1022  Transfers  Transfers Sit to Stand;Stand to Sit  Sit to Stand 4: Min guard;5: Supervision;With upper extremity assist;With armrests;From chair/3-in-1  Stand to Sit 4: Min guard;5: Supervision;With upper extremity assist;With armrests;To chair/3-in-1  Stand to Sit Details (indicate cue type and reason) Verbal cues for technique;Verbal  cues for precautions/safety;Verbal cues for safe use of DME/AE  Stand to Sit Details cues needed each time to fully turn and ensure she is at the chair surface before sitting down. cues to extend prosthesis out needed at times as well.   Ambulation/Gait  Ambulation/Gait Yes  Ambulation/Gait Assistance 4: Min assist;4: Min guard;5: Supervision  Ambulation/Gait Assistance Details reminder cues needed on posture and for weight shifting over prosthesis in stance  Ambulation Distance (Feet) 20 Feet (x1 right PFRW; 40 x3 RW, 35 x1 RW)  Assistive device Rolling walker;Prosthesis;Right platform walker  Gait Pattern Step-to pattern;Decreased step length - right;Decreased stance time - left;Decreased stride length;Decreased weight shift to left;Left circumduction;Left hip hike;Antalgic;Lateral hip instability;Trunk flexed;Abducted - left;Poor foot clearance - left;Poor foot clearance - right  Ambulation Surface Level;Indoor  Ramp 4: Min assist  Ramp Details (indicate cue type and reason) with RW/prosthesis, cues on sequencing, posture and step length  Curb 4: Min assist  Curb Details (indicate cue type and reason) with RW/prosthesis, cues on sequencing and technique. cues on stance position with RW advancement.   Prosthetics  Current prosthetic wear tolerance (days/week)  daily  Current prosthetic wear tolerance (#hours/day)  5 hours 2x day  Residual limb condition  intact with no issues.   Education Provided Proper weight-bearing schedule/adjustment;Residual limb care  Person(s) Educated Patient;Caregiver(s)  Education Method Demonstration;Explanation;Verbal cues  Education Method Verbalized understanding;Returned demonstration;Verbal cues required;Tactile cues required;Needs further instruction  Donning Prosthesis 5  Doffing Prosthesis 5       PT Short Term Goals - 04/08/18 0705      PT SHORT TERM GOAL #1   Title  Patient correctly donnes prosthesis without assistance or cues. (All updated  STGs Target Date 05/02/2018)    Time  4    Period  Weeks    Status  New    Target Date  05/02/18      PT SHORT TERM GOAL #2   Title  Patient tolerates daily prosthesis wear >10hrs total / day.     Time  4    Period  Weeks    Status  New    Target Date  05/02/18      PT SHORT TERM GOAL #3   Title  Patient sit to/from stand from chairs without armrests to rolling walker with prosthesis with supervision.     Time  4    Period  Weeks    Status  New    Target Date  05/02/18      PT SHORT TERM GOAL #4   Title  Patient ambulates 49' with rolling walker & locked prosthesis with min guard.    Time  4    Period  Weeks    Status  New    Target Date  05/02/18      PT SHORT TERM GOAL #5   Title  Standing balance with RW reaching 5" anteriorly and within 10" of floor with supervision.     Time  4    Period  Weeks    Status  New    Target Date  05/02/18        PT Long Term Goals - 04/21/18 1157      PT  LONG TERM GOAL #1   Title  Patient & sister verbalize & demonstrate understanding of proper prosthetic care to enable safe use of prosthesis.  (All LTG Target Dates:  05/30/2018)    Time  12    Period  Weeks    Status  On-going    Target Date  05/30/18      PT LONG TERM GOAL #2   Title  Patient tolerates wear of Transfemoral prosthesis >75% of awake hours without skin issues or limb pain.     Time  12    Period  Weeks    Status  On-going    Target Date  05/30/18      PT LONG TERM GOAL #3   Title  Standing balance with RW support: reaching 10" anteriorly, picks up object from floor, scans environment & manages clothes for toileting modified indepenent.     Time  12    Period  Weeks    Status  On-going    Target Date  05/30/18      PT LONG TERM GOAL #4   Title  Patient ambulates 200' with RW & prosthesis with supervision for limited community mobility.     Time  12    Period  Weeks    Status  On-going    Target Date  05/30/18      PT LONG TERM GOAL #5   Title  Patient  ambulates 71' around furniture with RW & prosthesis modified independent for household mobility.     Time  12    Period  Weeks    Status  On-going    Target Date  05/30/18      PT LONG TERM GOAL #6   Title  Patient negotiates stairs with 2 rails, ramps & curbs with RW & prosthesis with minimal assist for community access.     Time  12    Period  Weeks    Status  On-going    Target Date  05/30/18         04/24/18 1021  Plan  Clinical Impression Statement Today's skilled session continued to address mobility with lock knee prosthesis. Trialed RW with platform attachement to assess safety should this be allowed by surgeon after pt's upcoming hand surgery. No issues noted, pt's caregiver will check with MD to see it this will be allowed and get script if it is. Remainder of session addressed distance gait and barriers with RW with coban on right handle and locked knee prosthesis with no issues. Pt is progressing toward goals and should benefit from continued PT to progress toward unmet goals.                               Pt will benefit from skilled therapeutic intervention in order to improve on the following deficits Abnormal gait;Decreased activity tolerance;Decreased balance;Decreased coordination;Decreased endurance;Decreased knowledge of use of DME;Decreased mobility;Decreased range of motion;Decreased scar mobility;Decreased strength;Impaired flexibility;Postural dysfunction;Obesity;Prosthetic Dependency  Rehab Potential Good  Clinical Impairments Affecting Rehab Potential pt unable to read / write and sister who is caregiver reports limited ability to read / write  PT Frequency 3x / week (3x/wk for 4 weeks then 2x/wk for 8 weeks)  PT Duration 12 weeks  PT Treatment/Interventions ADLs/Self Care Home Management;DME Instruction;Gait training;Stair training;Functional mobility training;Therapeutic activities;Therapeutic exercise;Balance training;Neuromuscular re-education;Patient/family  education;Prosthetic Training;Passive range of motion;Vestibular;Canalith Repostioning  PT Next Visit Plan work towards updated STGs;  prosthetic care, ask about  car transfers while wearing the prosthesis and if family needs any additional practice, prosthetic gait with RW (wrap right handle with coban)  Consulted and Agree with Plan of Care Patient;Family member/caregiver  Family Member Consulted Niece, [name]       Patient will benefit from skilled therapeutic intervention in order to improve the following deficits and impairments:  Abnormal gait, Decreased activity tolerance, Decreased balance, Decreased coordination, Decreased endurance, Decreased knowledge of use of DME, Decreased mobility, Decreased range of motion, Decreased scar mobility, Decreased strength, Impaired flexibility, Postural dysfunction, Obesity, Prosthetic Dependency  Visit Diagnosis: Muscle weakness (generalized)  Unsteadiness on feet     Problem List Patient Active Problem List   Diagnosis Date Noted  . Atherosclerosis of native arteries of the extremities with gangrene ([place]) 11/01/2017  . Unilateral AKA, left ([place])   . Post-operative pain   . OSA (obstructive sleep apnea)   . Benign essential HTN   . Diabetes mellitus type 2 in obese ([place])   . Hyperkalemia   . Leukocytosis   . Acute blood loss anemia   . Amputation stump infection ([place]) 08/23/2017  . Amputation of left lower extremity above knee upon examination ([place]) 08/23/2017  . PAD (peripheral artery disease) ([place]) 07/23/2017  . Cellulitis 06/10/2017  . Hyponatremia 06/10/2017  . Fever 06/10/2017  . Rash 05/24/2017  . Diabetes ([place])   . HTN (hypertension)   . Allergic rhinitis   . Insomnia   . Depression     Willow Ora, [place], [facility] [place] [address], [place], [place] [address] [phone] 04/26/18, 4:03 PM   Name: Danielle Buchanan MRN: [id number] Date of Birth: Mar 09, 1956

## 2018-04-27 ENCOUNTER — Encounter: Payer: Self-pay | Admitting: Family

## 2018-04-28 ENCOUNTER — Ambulatory Visit: Payer: Medicare HMO | Admitting: Physical Therapy

## 2018-04-28 ENCOUNTER — Encounter: Payer: Self-pay | Admitting: Physical Therapy

## 2018-04-28 DIAGNOSIS — R2681 Unsteadiness on feet: Secondary | ICD-10-CM | POA: Diagnosis not present

## 2018-04-28 DIAGNOSIS — M6281 Muscle weakness (generalized): Secondary | ICD-10-CM | POA: Diagnosis not present

## 2018-04-28 DIAGNOSIS — R293 Abnormal posture: Secondary | ICD-10-CM | POA: Diagnosis not present

## 2018-04-28 DIAGNOSIS — R2689 Other abnormalities of gait and mobility: Secondary | ICD-10-CM | POA: Diagnosis not present

## 2018-04-28 DIAGNOSIS — M25652 Stiffness of left hip, not elsewhere classified: Secondary | ICD-10-CM | POA: Diagnosis not present

## 2018-04-28 NOTE — Therapy (Signed)
[facility] [address] [place], [place], [address] Phone: [phone]   Fax:  [phone]  Physical Therapy Treatment  Patient Details  Name: Danielle Buchanan MRN: [id number] Date of Birth: 01/14/1956 Referring Provider: Carol Webb, MD   Encounter Date: 04/28/2018  PT End of Session - 04/28/18 1020    Visit Number  17    Number of Visits  29    Date for PT Re-Evaluation  06/03/18    Authorization Type  [employer]    Authorization Time Period  $3400 OOP max has been met prior to PT evaluation. Visit limit Medicare guidelines    PT Start Time  1017    PT Stop Time  1057    PT Time Calculation (min)  40 min    Equipment Utilized During Treatment  Gait belt    Activity Tolerance  Patient tolerated treatment well;No increased pain    Behavior During Therapy  WFL for tasks assessed/performed       Past Medical History:  Diagnosis Date  . Allergic rhinitis   . Depression   . Diabetes ([facility])   . Dyslipidemia   . HTN (hypertension)   . Insomnia   . OSA (obstructive sleep apnea)    no cpap  . Urinary incontinence     Past Surgical History:  Procedure Laterality Date  . ABDOMINAL AORTOGRAM W/LOWER EXTREMITY N/A 06/24/2017   Procedure: ABDOMINAL AORTOGRAM W/LOWER EXTREMITY;  Surgeon: Cain, Brandon [facility], MD;  [facility];  Service: Cardiovascular;  Laterality: N/A;  . ABDOMINAL AORTOGRAM W/LOWER EXTREMITY Right 10/09/2017   Procedure: ABDOMINAL AORTOGRAM W/LOWER EXTREMITY;  Surgeon: Cain, Brandon [facility], MD;  [facility];  Service: Cardiovascular;  Laterality: Right;  . AMPUTATION Left 07/23/2017   Procedure: AMPUTATION  BELOW KNEE;  Surgeon: Cain, Brandon [facility], MD;  [facility];  Service: Vascular;  Laterality: Left;  . AMPUTATION Left 08/12/2017   Procedure: REVISION BELOW KNEE;  Surgeon: Cain, Brandon [facility], MD;  [facility];  Service: Vascular;   Laterality: Left;  . AMPUTATION Left 08/27/2017   Procedure: left ABOVE KNEE AMPUTATION;  Surgeon: Cain, Brandon [facility], MD;  [facility];  Service: Vascular;  Laterality: Left;  . AMPUTATION Left 08/30/2017   Procedure: REVISION AMPUTATION ABOVE KNEE;  Surgeon: Brabham, Vance W, MD;  [facility];  Service: Vascular;  Laterality: Left;  . AMPUTATION Right 11/04/2017   Procedure: AMPUTATION RIGHT FOURTH TOE;  Surgeon: Cain, Brandon [facility], MD;  [facility];  Service: Vascular;  Laterality: Right;  . APPLICATION OF WOUND VAC Left 08/27/2017   Procedure: APPLICATION OF WOUND VAC;  Surgeon: Cain, Brandon [facility], MD;  [facility];  Service: Vascular;  Laterality: Left;  . CARPAL TUNNEL RELEASE Right   . COLONOSCOPY    . LOWER EXTREMITY INTERVENTION Right 10/09/2017   Procedure: LOWER EXTREMITY INTERVENTION;  Surgeon: Cain, Brandon [facility], MD;  [facility];  Service: Cardiovascular;  Laterality: Right;    There were no vitals filed for this visit.  Subjective Assessment - 04/28/18 1018    Subjective  No new complaints. No falls. Wrist surgery is scheduled for 7/31.    Patient is accompained by:  Family member    Pertinent History  left TFA, right 4th toe amputation, DM2, PAD, HTN, right carpal tunnel release,    Limitations  Lifting;Standing;Walking;House hold activities    Patient Stated Goals  Walk with prosthesis in home & in community      Currently in Pain?  Yes    Pain Score  5     Pain Location  Wrist    Pain Orientation  Right    Multiple Pain Sites  No        [facility] Adult PT Treatment/Exercise - 04/28/18 1207      Ambulation/Gait   Ambulation/Gait  Yes    Ambulation/Gait Assistance  4: Min guard;4: Min assist;5: Supervision    Ambulation/Gait Assistance Details  reminder cues needed for posture and forward weight shifting over prosthesis during gait. built up handle of RW with Coban to increase handle size for improved hand comfort      Ambulation Distance (Feet)  40 Feet x1, 35x1, 25x1, 20x1    Assistive device  Rolling walker;Prosthesis    Gait Pattern  Step-to pattern;Decreased step length - right;Decreased stance time - left;Decreased stride length;Decreased weight shift to left;Left circumduction;Left hip hike;Antalgic;Lateral hip instability;Trunk flexed;Abducted - left;Poor foot clearance - left;Poor foot clearance - right    Ambulation Surface  Level;Indoor    Ramp  4: Min assist    Ramp Details (indicate cue type and reason)  with RW/prosthesis. mild loss of balance at top of ramp with turning needing assistance to correct. cues on posture, sequencing and technique needed.      Curb  4: Min assist    Curb Details (indicate cue type and reason)  with RW/prosthesis, cues needed on sequencing and technique      Prosthetics   Prosthetic Care Comments   --    Current prosthetic wear tolerance (days/week)   daily    Current prosthetic wear tolerance (#hours/day)   average 9 hours a day per pt    Current prosthetic weight-bearing tolerance (hours/day)   Pt. is beginning to shift more weight to her legs and prosthesis and is not bearing as much weight through her hands on the walker.    Residual limb condition   intact with no issues.     Education Provided  Proper weight-bearing schedule/adjustment;Residual limb care    Person(s) Educated  Patient;Caregiver(s)    Education Method  Explanation;Demonstration;Verbal cues    Education Method  Verbalized understanding;Returned demonstration;Verbal cues required;Needs further instruction    Donning Prosthesis  Modified independent (device/increased time)    Doffing Prosthesis  Modified independent (device/increased time)           PT Short Term Goals - 04/28/18 1022      PT SHORT TERM GOAL #1   Title  Patient correctly donnes prosthesis without assistance or cues. (All updated STGs Target Date 05/02/2018)    Baseline  04/28/2018 Pt. is mostly able to get her residual  limb seated in the prosthesis but still requires assistance to get full strap tightening to gain as much control of the prosthesis as possible.    Time  4    Period  Weeks    Status  Partially Met      PT SHORT TERM GOAL #2   Title  Patient tolerates daily prosthesis wear >10hrs total / day.     Baseline  04/28/2018 Pt. states that she is wearing prosthesis an average of 9 hours a day.     Time  4    Period  Weeks    Status  Partially Met      PT SHORT TERM GOAL #3   Title  Patient sit to/from stand from chairs without armrests to rolling walker with prosthesis with supervision.     Baseline  04/28/2018 Pt. is  able to sit in and get up from a chair without armrests with supervision-min guard and VCs.    Time  4    Period  Weeks    Status  Partially Met      PT SHORT TERM GOAL #4   Title  Patient ambulates 40' with rolling walker & locked prosthesis with min guard.    Baseline  04/28/2018 Met today. Pt ambulated full distance and needed only VCs to maintain proper technique.     Time  4    Period  Weeks    Status  Achieved      PT SHORT TERM GOAL #5   Title  Standing balance with RW reaching 5" anteriorly and within 10" of floor with supervision.     Time  4    Period  Weeks    Status  On-going        PT Long Term Goals - 04/21/18 1157      PT LONG TERM GOAL #1   Title  Patient & sister verbalize & demonstrate understanding of proper prosthetic care to enable safe use of prosthesis.  (All LTG Target Dates:  05/30/2018)    Time  12    Period  Weeks    Status  On-going    Target Date  05/30/18      PT LONG TERM GOAL #2   Title  Patient tolerates wear of Transfemoral prosthesis >75% of awake hours without skin issues or limb pain.     Time  12    Period  Weeks    Status  On-going    Target Date  05/30/18      PT LONG TERM GOAL #3   Title  Standing balance with RW support: reaching 10" anteriorly, picks up object from floor, scans environment & manages clothes for toileting  modified indepenent.     Time  12    Period  Weeks    Status  On-going    Target Date  05/30/18      PT LONG TERM GOAL #4   Title  Patient ambulates 200' with RW & prosthesis with supervision for limited community mobility.     Time  12    Period  Weeks    Status  On-going    Target Date  05/30/18      PT LONG TERM GOAL #5   Title  Patient ambulates 75' around furniture with RW & prosthesis modified independent for household mobility.     Time  12    Period  Weeks    Status  On-going    Target Date  05/30/18      PT LONG TERM GOAL #6   Title  Patient negotiates stairs with 2 rails, ramps & curbs with RW & prosthesis with minimal assist for community access.     Time  12    Period  Weeks    Status  On-going    Target Date  05/30/18        Plan - 04/28/18 1158    Clinical Impression Statement  Today's session focused on addressing STGs and gait with a RW. Utilized coban on Right handle to alleviate pressure for hand/wrist until surgery next week. Pt. would continue to benefit from PT to improve her gait and balance and meet her unmet goals.    Rehab Potential  Good    Clinical Impairments Affecting Rehab Potential  pt unable to read / write and sister who is caregiver reports limited   ability to read / write    PT Frequency  3x / week 3x/wk for 4 weeks then 2x/wk for 8 weeks    PT Duration  12 weeks    PT Treatment/Interventions  ADLs/Self Care Home Management;DME Instruction;Gait training;Stair training;Functional mobility training;Therapeutic activities;Therapeutic exercise;Balance training;Neuromuscular re-education;Patient/family education;Prosthetic Training;Passive range of motion;Vestibular;Canalith Repostioning    PT Next Visit Plan  Work toward [place]; prosthetic care, ask about car transfers while wearing the prosthesis and if family needs any additional practice, prosthetic gait with RW (wrap right handle with coban)    Consulted and Agree with Plan of Care  Patient;Family  member/caregiver    Family Member Consulted  Niece, [name]        Patient will benefit from skilled therapeutic intervention in order to improve the following deficits and impairments:  Abnormal gait, Decreased activity tolerance, Decreased balance, Decreased coordination, Decreased endurance, Decreased knowledge of use of DME, Decreased mobility, Decreased range of motion, Decreased scar mobility, Decreased strength, Impaired flexibility, Postural dysfunction, Obesity, Prosthetic Dependency  Visit Diagnosis: Muscle weakness (generalized)  Unsteadiness on feet  Other abnormalities of gait and mobility     Problem List Patient Active Problem List   Diagnosis Date Noted  . Atherosclerosis of native arteries of the extremities with gangrene ([place]) 11/01/2017  . Unilateral AKA, left ([place])   . Post-operative pain   . OSA (obstructive sleep apnea)   . Benign essential HTN   . Diabetes mellitus type 2 in obese ([place])   . Hyperkalemia   . Leukocytosis   . Acute blood loss anemia   . Amputation stump infection ([place]) 08/23/2017  . Amputation of left lower extremity above knee upon examination ([place]) 08/23/2017  . PAD (peripheral artery disease) ([place]) 07/23/2017  . Cellulitis 06/10/2017  . Hyponatremia 06/10/2017  . Fever 06/10/2017  . Rash 05/24/2017  . Diabetes ([place])   . HTN (hypertension)   . Allergic rhinitis   . Insomnia   . Depression     Arthor Captain, SPTA 04/28/2018, 12:16 PM  [facility] [address] [place], [place], [address] Phone: [phone]   Fax:  [phone]  Name: Danielle Buchanan MRN: [id number] Date of Birth: 27-May-1956  This note has been reviewed and edited by supervising CI. Added additional details to treatment section on ramp and curb.  Willow Ora, PTA, [place] [address], [place], [place] [address] [phone] 04/29/18, 4:32 PM

## 2018-05-01 ENCOUNTER — Ambulatory Visit: Payer: Medicare HMO | Admitting: Physical Therapy

## 2018-05-01 ENCOUNTER — Encounter: Payer: Self-pay | Admitting: Physical Therapy

## 2018-05-01 DIAGNOSIS — M6281 Muscle weakness (generalized): Secondary | ICD-10-CM

## 2018-05-01 DIAGNOSIS — R2689 Other abnormalities of gait and mobility: Secondary | ICD-10-CM | POA: Diagnosis not present

## 2018-05-01 DIAGNOSIS — R2681 Unsteadiness on feet: Secondary | ICD-10-CM | POA: Diagnosis not present

## 2018-05-01 DIAGNOSIS — R293 Abnormal posture: Secondary | ICD-10-CM

## 2018-05-01 DIAGNOSIS — M25652 Stiffness of left hip, not elsewhere classified: Secondary | ICD-10-CM | POA: Diagnosis not present

## 2018-05-01 NOTE — Therapy (Signed)
[facility] [address] [place], [place], [address] Phone: [phone]   Fax:  [phone]  Physical Therapy Treatment  Patient Details  Name: Danielle Buchanan MRN: [id number] Date of Birth: 1956-09-01 Referring Provider: Maurice Small, MD   Encounter Date: 05/01/2018  PT End of Session - 05/01/18 1205    Visit Number  18    Number of Visits  29    Date for PT Re-Evaluation  06/03/18    Authorization Type  [employer]    Authorization Time Period  $3400 OOP max has been met prior to PT evaluation. Visit limit Medicare guidelines    PT Start Time  6754    PT Stop Time  1145    PT Time Calculation (min)  40 min    Equipment Utilized During Treatment  Gait belt    Activity Tolerance  Patient tolerated treatment well;No increased pain    Behavior During Therapy  WFL for tasks assessed/performed       Past Medical History:  Diagnosis Date  . Allergic rhinitis   . Depression   . Diabetes ([place])   . Dyslipidemia   . HTN (hypertension)   . Insomnia   . OSA (obstructive sleep apnea)    no cpap  . Urinary incontinence     Past Surgical History:  Procedure Laterality Date  . ABDOMINAL AORTOGRAM W/LOWER EXTREMITY N/A 06/24/2017   Procedure: ABDOMINAL AORTOGRAM W/LOWER EXTREMITY;  Surgeon: Waynetta Sandy, MD;  Location: [facility];  Service: Cardiovascular;  Laterality: N/A;  . ABDOMINAL AORTOGRAM W/LOWER EXTREMITY Right 10/09/2017   Procedure: ABDOMINAL AORTOGRAM W/LOWER EXTREMITY;  Surgeon: Waynetta Sandy, MD;  Location: [facility];  Service: Cardiovascular;  Laterality: Right;  . AMPUTATION Left 07/23/2017   Procedure: AMPUTATION  BELOW KNEE;  Surgeon: Waynetta Sandy, MD;  Location: [facility];  Service: Vascular;  Laterality: Left;  . AMPUTATION Left 08/12/2017   Procedure: REVISION BELOW KNEE;  Surgeon: Waynetta Sandy, MD;  Location: [facility];  Service: Vascular;   Laterality: Left;  . AMPUTATION Left 08/27/2017   Procedure: left ABOVE KNEE AMPUTATION;  Surgeon: Waynetta Sandy, MD;  Location: [facility];  Service: Vascular;  Laterality: Left;  . AMPUTATION Left 08/30/2017   Procedure: REVISION AMPUTATION ABOVE KNEE;  Surgeon: Serafina Mitchell, MD;  Location: [facility];  Service: Vascular;  Laterality: Left;  . AMPUTATION Right 11/04/2017   Procedure: AMPUTATION RIGHT FOURTH TOE;  Surgeon: Waynetta Sandy, MD;  Location: [facility];  Service: Vascular;  Laterality: Right;  . APPLICATION OF WOUND VAC Left 08/27/2017   Procedure: APPLICATION OF WOUND VAC;  Surgeon: Waynetta Sandy, MD;  Location: [facility];  Service: Vascular;  Laterality: Left;  . CARPAL TUNNEL RELEASE Right   . COLONOSCOPY    . LOWER EXTREMITY INTERVENTION Right 10/09/2017   Procedure: LOWER EXTREMITY INTERVENTION;  Surgeon: Waynetta Sandy, MD;  Location: [facility];  Service: Cardiovascular;  Laterality: Right;    There were no vitals filed for this visit.  Subjective Assessment - 05/01/18 1106    Subjective  Pt did not wear the prosthesis Tuesday due soreness and wore minimally Wednesday due to being sick (vomiting)    Patient is accompained by:  Family member    Pertinent History  left TFA, right 4th toe amputation, DM2, PAD, HTN, right carpal tunnel release,    Limitations  Lifting;Standing;Walking;House hold activities    Patient Stated Goals  Walk with prosthesis  in home & in community    Currently in Pain?  Yes    Pain Score  6     Pain Location  Wrist    Pain Orientation  Right    Pain Descriptors / Indicators  Burning    Pain Type  Chronic pain;Neuropathic pain                       [facility] Adult PT Treatment/Exercise - 05/01/18 0001      Transfers   Transfers  Sit to Stand;Stand to Sit    Sit to Stand  4: Min guard;5: Supervision;With upper extremity assist;With armrests;From chair/3-in-1    Stand to Sit  4: Min guard;5:  Supervision;With upper extremity assist;With armrests;To chair/3-in-1    Stand to Sit Details (indicate cue type and reason)  Verbal cues for technique;Verbal cues for precautions/safety;Verbal cues for safe use of DME/AE    Stand to Sit Details  cues needed each time to fully turn and ensure she is at the chair surface before sitting down. cues to extend prosthesis out needed at times as well.     Transfer Cueing  Multiple reps to encourage consistency and follow through      Ambulation/Gait   Ambulation/Gait  Yes    Ambulation/Gait Assistance  4: Min guard    Ambulation/Gait Assistance Details  reminder cues needed for posture and forward weight shifting over prosthesis during gait. built up handle of RW with Coban to increase handle size for improved hand comfort     Ambulation Distance (Feet)  40 Feet +10    Assistive device  Rolling walker;Prosthesis    Gait Pattern  Step-to pattern;Decreased step length - right;Decreased stance time - left;Decreased stride length;Decreased weight shift to left;Left circumduction;Left hip hike;Antalgic;Lateral hip instability;Trunk flexed;Abducted - left;Poor foot clearance - left;Poor foot clearance - right    Ambulation Surface  Level;Indoor    Ramp  4: Min assist    Ramp Details (indicate cue type and reason)  with RW, minor LOB when stepping onto ramp ascending      Prosthetics   Prosthetic Care Comments   importance of wearing prosthesis even with muscle soreness in residual limb    Current prosthetic wear tolerance (days/week)   daily exception of last 2 days (see subjective)    Current prosthetic wear tolerance (#hours/day)   average 9 hours a day per pt    Residual limb condition   intact with no issues.     Education Provided  Proper wear schedule/adjustment    Person(s) Educated  Patient;Caregiver(s)    Education Method  Explanation    Education Method  Verbalized understanding          Balance Exercises - 05/01/18 1157      Balance  Exercises: Standing   Standing Eyes Opened  Wide ([place]);Head turns weight shifting; standing in fron of walker but attemping no UE support, min guard.         PT Short Term Goals - 04/28/18 1022      PT SHORT TERM GOAL #1   Title  Patient correctly donnes prosthesis without assistance or cues. (All updated STGs Target Date 05/02/2018)    Baseline  04/28/2018 Pt. is mostly able to get her residual limb seated in the prosthesis but still requires assistance to get full strap tightening to gain as much control of the prosthesis as possible.    Time  4    Period  Weeks    Status  Partially Met      PT SHORT TERM GOAL #2   Title  Patient tolerates daily prosthesis wear >10hrs total / day.     Baseline  04/28/2018 Pt. states that she is wearing prosthesis an average of 9 hours a day.     Time  4    Period  Weeks    Status  Partially Met      PT SHORT TERM GOAL #3   Title  Patient sit to/from stand from chairs without armrests to rolling walker with prosthesis with supervision.     Baseline  04/28/2018 Pt. is able to sit in and get up from a chair without armrests with supervision-min guard and VCs.    Time  4    Period  Weeks    Status  Partially Met      PT SHORT TERM GOAL #4   Title  Patient ambulates 98' with rolling walker & locked prosthesis with min guard.    Baseline  04/28/2018 Met today. Pt ambulated full distance and needed only VCs to maintain proper technique.     Time  4    Period  Weeks    Status  Achieved      PT SHORT TERM GOAL #5   Title  Standing balance with RW reaching 5" anteriorly and within 10" of floor with supervision.     Time  4    Period  Weeks    Status  On-going        PT Long Term Goals - 04/21/18 1157      PT LONG TERM GOAL #1   Title  Patient & sister verbalize & demonstrate understanding of proper prosthetic care to enable safe use of prosthesis.  (All LTG Target Dates:  05/30/2018)    Time  12    Period  Weeks    Status  On-going    Target  Date  05/30/18      PT LONG TERM GOAL #2   Title  Patient tolerates wear of Transfemoral prosthesis >75% of awake hours without skin issues or limb pain.     Time  12    Period  Weeks    Status  On-going    Target Date  05/30/18      PT LONG TERM GOAL #3   Title  Standing balance with RW support: reaching 10" anteriorly, picks up object from floor, scans environment & manages clothes for toileting modified indepenent.     Time  12    Period  Weeks    Status  On-going    Target Date  05/30/18      PT LONG TERM GOAL #4   Title  Patient ambulates 200' with RW & prosthesis with supervision for limited community mobility.     Time  12    Period  Weeks    Status  On-going    Target Date  05/30/18      PT LONG TERM GOAL #5   Title  Patient ambulates 30' around furniture with RW & prosthesis modified independent for household mobility.     Time  12    Period  Weeks    Status  On-going    Target Date  05/30/18      PT LONG TERM GOAL #6   Title  Patient negotiates stairs with 2 rails, ramps & curbs with RW & prosthesis with minimal assist for community access.     Time  12    Period  Weeks    Status  On-going    Target Date  05/30/18            Plan - 05/01/18 1201    Clinical Impression Statement  Pt continues to require constant cueing for safe transfer technique with RW; addressed with practising multiple reps, cues, and demonstration.  Pt demonstrates greater ability to keep posture upright and weight shift onto prosthesis for longer right step length; requires min guard and cues on level surface.                                       Rehab Potential  Good    Clinical Impairments Affecting Rehab Potential  pt unable to read / write and sister who is caregiver reports limited ability to read / write    PT Frequency  3x / week 3x/wk for 4 weeks then 2x/wk for 8 weeks    PT Duration  12 weeks    PT Treatment/Interventions  ADLs/Self Care Home Management;DME Instruction;Gait  training;Stair training;Functional mobility training;Therapeutic activities;Therapeutic exercise;Balance training;Neuromuscular re-education;Patient/family education;Prosthetic Training;Passive range of motion;Vestibular;Canalith Repostioning    PT Next Visit Plan  Work toward [place]; prosthetic care, prosthetic gait with RW (wrap right handle with coban)    Consulted and Agree with Plan of Care  Patient;Family member/caregiver    Family Member Consulted  Niece, [name]        Patient will benefit from skilled therapeutic intervention in order to improve the following deficits and impairments:  Abnormal gait, Decreased activity tolerance, Decreased balance, Decreased coordination, Decreased endurance, Decreased knowledge of use of DME, Decreased mobility, Decreased range of motion, Decreased scar mobility, Decreased strength, Impaired flexibility, Postural dysfunction, Obesity, Prosthetic Dependency  Visit Diagnosis: Muscle weakness (generalized)  Unsteadiness on feet  Other abnormalities of gait and mobility  Abnormal posture     Problem List Patient Active Problem List   Diagnosis Date Noted  . Atherosclerosis of native arteries of the extremities with gangrene ([place]) 11/01/2017  . Unilateral AKA, left ([place])   . Post-operative pain   . OSA (obstructive sleep apnea)   . Benign essential HTN   . Diabetes mellitus type 2 in obese ([place])   . Hyperkalemia   . Leukocytosis   . Acute blood loss anemia   . Amputation stump infection ([place]) 08/23/2017  . Amputation of left lower extremity above knee upon examination ([place]) 08/23/2017  . PAD (peripheral artery disease) ([place]) 07/23/2017  . Cellulitis 06/10/2017  . Hyponatremia 06/10/2017  . Fever 06/10/2017  . Rash 05/24/2017  . Diabetes ([place])   . HTN (hypertension)   . Allergic rhinitis   . Insomnia   . Depression     Bjorn Loser, PTA  05/01/18, 12:07 PM [facility] [address] [place], [place], [address] Phone: [phone]   Fax:  [phone]  Name: Arlet Marter MRN: [id number] Date of Birth: [birth year]

## 2018-05-04 DIAGNOSIS — S88012A Complete traumatic amputation at knee level, left lower leg, initial encounter: Secondary | ICD-10-CM | POA: Diagnosis not present

## 2018-05-04 DIAGNOSIS — Z89612 Acquired absence of left leg above knee: Secondary | ICD-10-CM | POA: Diagnosis not present

## 2018-05-04 DIAGNOSIS — R296 Repeated falls: Secondary | ICD-10-CM | POA: Diagnosis not present

## 2018-05-04 DIAGNOSIS — E1121 Type 2 diabetes mellitus with diabetic nephropathy: Secondary | ICD-10-CM | POA: Diagnosis not present

## 2018-05-04 DIAGNOSIS — T8189XA Other complications of procedures, not elsewhere classified, initial encounter: Secondary | ICD-10-CM | POA: Diagnosis not present

## 2018-05-04 DIAGNOSIS — Z89619 Acquired absence of unspecified leg above knee: Secondary | ICD-10-CM | POA: Diagnosis not present

## 2018-05-05 ENCOUNTER — Ambulatory Visit: Payer: Medicare HMO | Admitting: Physical Therapy

## 2018-05-05 ENCOUNTER — Encounter: Payer: Self-pay | Admitting: Physical Therapy

## 2018-05-05 DIAGNOSIS — R2689 Other abnormalities of gait and mobility: Secondary | ICD-10-CM | POA: Diagnosis not present

## 2018-05-05 DIAGNOSIS — M6281 Muscle weakness (generalized): Secondary | ICD-10-CM | POA: Diagnosis not present

## 2018-05-05 DIAGNOSIS — R2681 Unsteadiness on feet: Secondary | ICD-10-CM

## 2018-05-05 DIAGNOSIS — R293 Abnormal posture: Secondary | ICD-10-CM

## 2018-05-05 DIAGNOSIS — M25652 Stiffness of left hip, not elsewhere classified: Secondary | ICD-10-CM | POA: Diagnosis not present

## 2018-05-06 NOTE — Therapy (Signed)
[facility] [address] [place], [place], [address] Phone: [phone]   Fax:  [phone]  Physical Therapy Treatment  Patient Details  Name: Danielle Buchanan MRN: [id number] Date of Birth: 07-17-1956 Referring Provider: Maurice Small, MD   Encounter Date: 05/05/2018  PT End of Session - 05/05/18 1024    Visit Number  19    Number of Visits  29    Date for PT Re-Evaluation  06/03/18    Authorization Type  [employer]    Authorization Time Period  $3400 OOP max has been met prior to PT evaluation. Visit limit Medicare guidelines    PT Start Time  1019    PT Stop Time  1059    PT Time Calculation (min)  40 min    Equipment Utilized During Treatment  Gait belt    Activity Tolerance  Patient tolerated treatment well;Patient limited by pain limited by hand pain with increased pressure, rest breaks helped    Behavior During Therapy  WFL for tasks assessed/performed       Past Medical History:  Diagnosis Date  . Allergic rhinitis   . Depression   . Diabetes ([place])   . Dyslipidemia   . HTN (hypertension)   . Insomnia   . OSA (obstructive sleep apnea)    no cpap  . Urinary incontinence     Past Surgical History:  Procedure Laterality Date  . ABDOMINAL AORTOGRAM W/LOWER EXTREMITY N/A 06/24/2017   Procedure: ABDOMINAL AORTOGRAM W/LOWER EXTREMITY;  Surgeon: Waynetta Sandy, MD;  Location: [facility];  Service: Cardiovascular;  Laterality: N/A;  . ABDOMINAL AORTOGRAM W/LOWER EXTREMITY Right 10/09/2017   Procedure: ABDOMINAL AORTOGRAM W/LOWER EXTREMITY;  Surgeon: Waynetta Sandy, MD;  Location: [facility];  Service: Cardiovascular;  Laterality: Right;  . AMPUTATION Left 07/23/2017   Procedure: AMPUTATION  BELOW KNEE;  Surgeon: Waynetta Sandy, MD;  Location: [facility];  Service: Vascular;  Laterality: Left;  . AMPUTATION Left 08/12/2017   Procedure: REVISION BELOW KNEE;  Surgeon:  Waynetta Sandy, MD;  Location: [facility];  Service: Vascular;  Laterality: Left;  . AMPUTATION Left 08/27/2017   Procedure: left ABOVE KNEE AMPUTATION;  Surgeon: Waynetta Sandy, MD;  Location: [facility];  Service: Vascular;  Laterality: Left;  . AMPUTATION Left 08/30/2017   Procedure: REVISION AMPUTATION ABOVE KNEE;  Surgeon: Serafina Mitchell, MD;  Location: [facility];  Service: Vascular;  Laterality: Left;  . AMPUTATION Right 11/04/2017   Procedure: AMPUTATION RIGHT FOURTH TOE;  Surgeon: Waynetta Sandy, MD;  Location: [facility];  Service: Vascular;  Laterality: Right;  . APPLICATION OF WOUND VAC Left 08/27/2017   Procedure: APPLICATION OF WOUND VAC;  Surgeon: Waynetta Sandy, MD;  Location: [facility];  Service: Vascular;  Laterality: Left;  . CARPAL TUNNEL RELEASE Right   . COLONOSCOPY    . LOWER EXTREMITY INTERVENTION Right 10/09/2017   Procedure: LOWER EXTREMITY INTERVENTION;  Surgeon: Waynetta Sandy, MD;  Location: [facility];  Service: Cardiovascular;  Laterality: Right;    There were no vitals filed for this visit.  Subjective Assessment - 05/05/18 1020    Subjective  No new complaitns. No falls. Continues to have right hand/finger pain. Has surgery this Wednesday.     Patient is accompained by:  Family member    Pertinent History  left TFA, right 4th toe amputation, DM2, PAD, HTN, right carpal tunnel release,    Limitations  Lifting;Standing;Walking;House hold activities  Patient Stated Goals  Walk with prosthesis in home & in community    Currently in Pain?  Yes    Pain Score  5     Pain Location  Wrist    Pain Orientation  Right    Pain Descriptors / Indicators  Aching    Pain Type  Chronic pain    Pain Onset  More than a month ago    Pain Frequency  Constant    Aggravating Factors   weight bearing through arm    Pain Relieving Factors  taking weight off the wrist          [facility] Adult PT Treatment/Exercise - 05/05/18 1024       Transfers   Transfers  Sit to Stand;Stand to Sit    Sit to Stand  5: Supervision;With upper extremity assist;From chair/3-in-1    Stand to Sit  4: Min guard;With upper extremity assist;To chair/3-in-1      Ambulation/Gait   Ambulation/Gait  Yes    Ambulation/Gait Assistance  4: Min guard;5: Supervision    Ambulation Distance (Feet)  70 Feet x2    Assistive device  Rolling walker;Prosthesis    Gait Pattern  Step-to pattern;Decreased step length - right;Decreased stance time - left;Decreased stride length;Decreased weight shift to left;Left circumduction;Left hip hike;Antalgic;Lateral hip instability;Trunk flexed;Abducted - left;Poor foot clearance - left;Poor foot clearance - right    Ambulation Surface  Level;Indoor    Ramp  4: Min assist    Ramp Details (indicate cue type and reason)  with RW/prosthesis, cues on sequencing and safety    Curb  4: Min assist    Curb Details (indicate cue type and reason)  with RW/prosthesis, cues on posture, sequencing and safety      Neuro Re-ed    Neuro Re-ed Details   for balance/proprioception/weight bearing on prosthesis: standing with RW in front/wheelchair behind her- working on lateral weight shifting with no UE support, min assist due to loss of balance with intermittent touch to RW as well;  with equal weight bearing EC for 30 second goal, min to mod assist due to posterior balance losses. Performed multiple reps with one short seated rest break between.      Prosthetics   Current prosthetic wear tolerance (days/week)   daily    Current prosthetic wear tolerance (#hours/day)   average 9 hours a day per pt    Residual limb condition   intact with no issues.     Donning Prosthesis  Modified independent (device/increased time)    Doffing Prosthesis  Modified independent (device/increased time)           PT Short Term Goals - 04/28/18 1022      PT SHORT TERM GOAL #1   Title  Patient correctly donnes prosthesis without assistance or cues. (All  updated STGs Target Date 05/02/2018)    Baseline  04/28/2018 Pt. is mostly able to get her residual limb seated in the prosthesis but still requires assistance to get full strap tightening to gain as much control of the prosthesis as possible.    Time  4    Period  Weeks    Status  Partially Met      PT SHORT TERM GOAL #2   Title  Patient tolerates daily prosthesis wear >10hrs total / day.     Baseline  04/28/2018 Pt. states that she is wearing prosthesis an average of 9 hours a day.     Time  4    Period  Weeks    Status  Partially Met      PT SHORT TERM GOAL #3   Title  Patient sit to/from stand from chairs without armrests to rolling walker with prosthesis with supervision.     Baseline  04/28/2018 Pt. is able to sit in and get up from a chair without armrests with supervision-min guard and VCs.    Time  4    Period  Weeks    Status  Partially Met      PT SHORT TERM GOAL #4   Title  Patient ambulates 23' with rolling walker & locked prosthesis with min guard.    Baseline  04/28/2018 Met today. Pt ambulated full distance and needed only VCs to maintain proper technique.     Time  4    Period  Weeks    Status  Achieved      PT SHORT TERM GOAL #5   Title  Standing balance with RW reaching 5" anteriorly and within 10" of floor with supervision.     Time  4    Period  Weeks    Status  On-going        PT Long Term Goals - 04/21/18 1157      PT LONG TERM GOAL #1   Title  Patient & sister verbalize & demonstrate understanding of proper prosthetic care to enable safe use of prosthesis.  (All LTG Target Dates:  05/30/2018)    Time  12    Period  Weeks    Status  On-going    Target Date  05/30/18      PT LONG TERM GOAL #2   Title  Patient tolerates wear of Transfemoral prosthesis >75% of awake hours without skin issues or limb pain.     Time  12    Period  Weeks    Status  On-going    Target Date  05/30/18      PT LONG TERM GOAL #3   Title  Standing balance with RW support:  reaching 10" anteriorly, picks up object from floor, scans environment & manages clothes for toileting modified indepenent.     Time  12    Period  Weeks    Status  On-going    Target Date  05/30/18      PT LONG TERM GOAL #4   Title  Patient ambulates 200' with RW & prosthesis with supervision for limited community mobility.     Time  12    Period  Weeks    Status  On-going    Target Date  05/30/18      PT LONG TERM GOAL #5   Title  Patient ambulates 8' around furniture with RW & prosthesis modified independent for household mobility.     Time  12    Period  Weeks    Status  On-going    Target Date  05/30/18      PT LONG TERM GOAL #6   Title  Patient negotiates stairs with 2 rails, ramps & curbs with RW & prosthesis with minimal assist for community access.     Time  12    Period  Weeks    Status  On-going    Target Date  05/30/18            Plan - 05/05/18 1802    Clinical Impression Statement  Today's skilled session continued to focus on gait/barriers with RW/prosthesis. Pt is progressing well toward goals with increased consecutive and overall gait  distances this session. Pt should benefit from continued PT to progress toward unmet goals. Pt will be on hold for a few weeks due to having right wrist surgery this week.     Rehab Potential  Good    Clinical Impairments Affecting Rehab Potential  pt unable to read / write and sister who is caregiver reports limited ability to read / write    PT Frequency  3x / week 3x/wk for 4 weeks then 2x/wk for 8 weeks    PT Duration  12 weeks    PT Treatment/Interventions  ADLs/Self Care Home Management;DME Instruction;Gait training;Stair training;Functional mobility training;Therapeutic activities;Therapeutic exercise;Balance training;Neuromuscular re-education;Patient/family education;Prosthetic Training;Passive range of motion;Vestibular;Canalith Repostioning    PT Next Visit Plan  Primary PT to reassess on pt's return to PT after  wrist surgery    Consulted and Agree with Plan of Care  Patient;Family member/caregiver    Family Member Consulted  Niece, [name]        Patient will benefit from skilled therapeutic intervention in order to improve the following deficits and impairments:  Abnormal gait, Decreased activity tolerance, Decreased balance, Decreased coordination, Decreased endurance, Decreased knowledge of use of DME, Decreased mobility, Decreased range of motion, Decreased scar mobility, Decreased strength, Impaired flexibility, Postural dysfunction, Obesity, Prosthetic Dependency  Visit Diagnosis: Muscle weakness (generalized)  Unsteadiness on feet  Other abnormalities of gait and mobility  Abnormal posture     Problem List Patient Active Problem List   Diagnosis Date Noted  . Atherosclerosis of native arteries of the extremities with gangrene ([place]) 11/01/2017  . Unilateral AKA, left ([place])   . Post-operative pain   . OSA (obstructive sleep apnea)   . Benign essential HTN   . Diabetes mellitus type 2 in obese ([place])   . Hyperkalemia   . Leukocytosis   . Acute blood loss anemia   . Amputation stump infection ([place]) 08/23/2017  . Amputation of left lower extremity above knee upon examination ([place]) 08/23/2017  . PAD (peripheral artery disease) ([place]) 07/23/2017  . Cellulitis 06/10/2017  . Hyponatremia 06/10/2017  . Fever 06/10/2017  . Rash 05/24/2017  . Diabetes ([place])   . HTN (hypertension)   . Allergic rhinitis   . Insomnia   . Depression     [place], [place], [place] [address], [place], [place] [address] [phone] 05/06/18, 6:04 PM   Name: Danielle Buchanan MRN: [id number] Date of Birth: [birth year]

## 2018-05-07 DIAGNOSIS — G5621 Lesion of ulnar nerve, right upper limb: Secondary | ICD-10-CM | POA: Diagnosis not present

## 2018-05-08 ENCOUNTER — Other Ambulatory Visit: Payer: Self-pay

## 2018-05-08 DIAGNOSIS — I779 Disorder of arteries and arterioles, unspecified: Secondary | ICD-10-CM

## 2018-05-08 DIAGNOSIS — Z89612 Acquired absence of left leg above knee: Secondary | ICD-10-CM

## 2018-05-08 DIAGNOSIS — I739 Peripheral vascular disease, unspecified: Secondary | ICD-10-CM

## 2018-05-08 DIAGNOSIS — Z89421 Acquired absence of other right toe(s): Secondary | ICD-10-CM

## 2018-05-09 ENCOUNTER — Encounter: Payer: Medicare HMO | Admitting: Physical Therapy

## 2018-05-12 ENCOUNTER — Encounter: Payer: Medicare HMO | Admitting: Physical Therapy

## 2018-05-13 ENCOUNTER — Encounter (INDEPENDENT_AMBULATORY_CARE_PROVIDER_SITE_OTHER): Payer: Self-pay | Admitting: Ophthalmology

## 2018-05-15 ENCOUNTER — Encounter: Payer: Medicare HMO | Admitting: Physical Therapy

## 2018-05-19 ENCOUNTER — Encounter: Payer: Medicare HMO | Admitting: Physical Therapy

## 2018-05-22 ENCOUNTER — Encounter: Payer: Medicare HMO | Admitting: Physical Therapy

## 2018-05-23 ENCOUNTER — Ambulatory Visit: Payer: Medicare HMO | Admitting: Endocrinology

## 2018-05-26 ENCOUNTER — Ambulatory Visit: Payer: Medicare HMO | Attending: Family Medicine | Admitting: Physical Therapy

## 2018-05-26 ENCOUNTER — Encounter: Payer: Self-pay | Admitting: Physical Therapy

## 2018-05-26 DIAGNOSIS — R293 Abnormal posture: Secondary | ICD-10-CM | POA: Diagnosis not present

## 2018-05-26 DIAGNOSIS — R2681 Unsteadiness on feet: Secondary | ICD-10-CM | POA: Diagnosis not present

## 2018-05-26 DIAGNOSIS — M6281 Muscle weakness (generalized): Secondary | ICD-10-CM

## 2018-05-26 DIAGNOSIS — R2689 Other abnormalities of gait and mobility: Secondary | ICD-10-CM | POA: Insufficient documentation

## 2018-05-27 NOTE — Therapy (Signed)
[facility] [address] [place], [place], [address] Phone: [phone]   Fax:  [phone]  Physical Therapy Treatment  Patient Details  Name: Danielle Buchanan MRN: [id number] Date of Birth: 09-02-1956 Referring Provider: Maurice Small, MD   Encounter Date: 05/26/2018  PT End of Session - 05/26/18 1207    Visit Number  20    Number of Visits  29    Date for PT Re-Evaluation  06/03/18    Authorization Type  [employer]    Authorization Time Period  $3400 OOP max has been met prior to PT evaluation. Visit limit Medicare guidelines    PT Start Time  1031    PT Stop Time  1100    PT Time Calculation (min)  29 min    Equipment Utilized During Treatment  Gait belt    Activity Tolerance  Patient tolerated treatment well;Patient limited by pain   limited by hand pain with increased pressure, rest breaks helped   Behavior During Therapy  WFL for tasks assessed/performed       Past Medical History:  Diagnosis Date  . Allergic rhinitis   . Depression   . Diabetes ([place])   . Dyslipidemia   . HTN (hypertension)   . Insomnia   . OSA (obstructive sleep apnea)    no cpap  . Urinary incontinence     Past Surgical History:  Procedure Laterality Date  . ABDOMINAL AORTOGRAM W/LOWER EXTREMITY N/A 06/24/2017   Procedure: ABDOMINAL AORTOGRAM W/LOWER EXTREMITY;  Surgeon: Waynetta Sandy, MD;  Location: [facility];  Service: Cardiovascular;  Laterality: N/A;  . ABDOMINAL AORTOGRAM W/LOWER EXTREMITY Right 10/09/2017   Procedure: ABDOMINAL AORTOGRAM W/LOWER EXTREMITY;  Surgeon: Waynetta Sandy, MD;  Location: [facility];  Service: Cardiovascular;  Laterality: Right;  . AMPUTATION Left 07/23/2017   Procedure: AMPUTATION  BELOW KNEE;  Surgeon: Waynetta Sandy, MD;  Location: [facility];  Service: Vascular;  Laterality: Left;  . AMPUTATION Left 08/12/2017   Procedure: REVISION BELOW KNEE;  Surgeon:  Waynetta Sandy, MD;  Location: [facility];  Service: Vascular;  Laterality: Left;  . AMPUTATION Left 08/27/2017   Procedure: left ABOVE KNEE AMPUTATION;  Surgeon: Waynetta Sandy, MD;  Location: [facility];  Service: Vascular;  Laterality: Left;  . AMPUTATION Left 08/30/2017   Procedure: REVISION AMPUTATION ABOVE KNEE;  Surgeon: Serafina Mitchell, MD;  Location: [facility];  Service: Vascular;  Laterality: Left;  . AMPUTATION Right 11/04/2017   Procedure: AMPUTATION RIGHT FOURTH TOE;  Surgeon: Waynetta Sandy, MD;  Location: [facility];  Service: Vascular;  Laterality: Right;  . APPLICATION OF WOUND VAC Left 08/27/2017   Procedure: APPLICATION OF WOUND VAC;  Surgeon: Waynetta Sandy, MD;  Location: [facility];  Service: Vascular;  Laterality: Left;  . CARPAL TUNNEL RELEASE Right   . COLONOSCOPY    . LOWER EXTREMITY INTERVENTION Right 10/09/2017   Procedure: LOWER EXTREMITY INTERVENTION;  Surgeon: Waynetta Sandy, MD;  Location: [facility];  Service: Cardiovascular;  Laterality: Right;    There were no vitals filed for this visit.  Subjective Assessment - 05/26/18 1033    Subjective  She had surgery on 05/07/2018 on right elbow. She has been cleared to resume PT & use to tolerance with pressure on RW. She wore prosthesis yesterday for first time since surgery. She has made progress with use of prosthesis with PT but needs additional care.     Patient is accompained  by:  Family member    Pertinent History  left TFA, right 4th toe amputation, DM2, PAD, HTN, right carpal tunnel release,    Limitations  Lifting;Standing;Walking;House hold activities    Patient Stated Goals  Walk with prosthesis in home & in community    Currently in Pain?  No/denies    Pain Onset  More than a month ago                       [facility] Adult PT Treatment/Exercise - 05/26/18 1030      Transfers   Transfers  Sit to Stand;Stand to Sit    Sit to Stand  5: Supervision;With  upper extremity assist;From chair/3-in-1;With armrests   to platform RWm locking prosthetic knee upon arising   Stand to Sit  5: Supervision;With upper extremity assist;With armrests;To chair/3-in-1   from platform RW with prosthetic knee locked     Ambulation/Gait   Ambulation/Gait  Yes    Ambulation/Gait Assistance  4: Min guard;5: Supervision   Min guard initially progressed to supervision   Ambulation/Gait Assistance Details  verbal cues on posture and wt shift.     Ambulation Distance (Feet)  25 Feet   25' & 50'   Assistive device  Prosthesis;Right platform walker    Gait Pattern  Step-to pattern;Decreased step length - right;Decreased stance time - left;Decreased stride length;Decreased weight shift to left;Left circumduction;Left hip hike;Antalgic;Lateral hip instability;Trunk flexed;Abducted - left;Poor foot clearance - left;Poor foot clearance - right    Ambulation Surface  Level;Indoor      Prosthetics   Current prosthetic wear tolerance (days/week)   wore yesterday for first time since surgery 19 days ago.     Current prosthetic wear tolerance (#hours/day)   reports wore 4 hrs 2x yesterday; PT recommended increasing to 6 hrs 2x/day    Residual limb condition   intact with no issues.     Education Provided  Skin check;Proper Donning;Proper wear schedule/adjustment    Person(s) Educated  Patient;Caregiver(s)    Education Method  Explanation;Verbal cues    Education Method  Verbalized understanding;Verbal cues required;Needs further instruction             PT Education - 05/26/18 1055    Education Details  Pt questioning when she can use a cane. PT recommended staying on RW & working on skills to function at basic community level with another 90-day plan of care. Then taking a break from PT to build strength & endurance. At that time if she wants to work on cane, PT can reassess.     Person(s) Educated  Patient;Caregiver(s)    Methods  Explanation    Comprehension   Verbalized understanding       PT Short Term Goals - 04/28/18 1022      PT SHORT TERM GOAL #1   Title  Patient correctly donnes prosthesis without assistance or cues. (All updated STGs Target Date 05/02/2018)    Baseline  04/28/2018 Pt. is mostly able to get her residual limb seated in the prosthesis but still requires assistance to get full strap tightening to gain as much control of the prosthesis as possible.    Time  4    Period  Weeks    Status  Partially Met      PT SHORT TERM GOAL #2   Title  Patient tolerates daily prosthesis wear >10hrs total / day.     Baseline  04/28/2018 Pt. states that she is wearing prosthesis an average  of 9 hours a day.     Time  4    Period  Weeks    Status  Partially Met      PT SHORT TERM GOAL #3   Title  Patient sit to/from stand from chairs without armrests to rolling walker with prosthesis with supervision.     Baseline  04/28/2018 Pt. is able to sit in and get up from a chair without armrests with supervision-min guard and VCs.    Time  4    Period  Weeks    Status  Partially Met      PT SHORT TERM GOAL #4   Title  Patient ambulates 32' with rolling walker & locked prosthesis with min guard.    Baseline  04/28/2018 Met today. Pt ambulated full distance and needed only VCs to maintain proper technique.     Time  4    Period  Weeks    Status  Achieved      PT SHORT TERM GOAL #5   Title  Standing balance with RW reaching 5" anteriorly and within 10" of floor with supervision.     Time  4    Period  Weeks    Status  On-going        PT Long Term Goals - 04/21/18 1157      PT LONG TERM GOAL #1   Title  Patient & sister verbalize & demonstrate understanding of proper prosthetic care to enable safe use of prosthesis.  (All LTG Target Dates:  05/30/2018)    Time  12    Period  Weeks    Status  On-going    Target Date  05/30/18      PT LONG TERM GOAL #2   Title  Patient tolerates wear of Transfemoral prosthesis >75% of awake hours without  skin issues or limb pain.     Time  12    Period  Weeks    Status  On-going    Target Date  05/30/18      PT LONG TERM GOAL #3   Title  Standing balance with RW support: reaching 10" anteriorly, picks up object from floor, scans environment & manages clothes for toileting modified indepenent.     Time  12    Period  Weeks    Status  On-going    Target Date  05/30/18      PT LONG TERM GOAL #4   Title  Patient ambulates 200' with RW & prosthesis with supervision for limited community mobility.     Time  12    Period  Weeks    Status  On-going    Target Date  05/30/18      PT LONG TERM GOAL #5   Title  Patient ambulates 55' around furniture with RW & prosthesis modified independent for household mobility.     Time  12    Period  Weeks    Status  On-going    Target Date  05/30/18      PT LONG TERM GOAL #6   Title  Patient negotiates stairs with 2 rails, ramps & curbs with RW & prosthesis with minimal assist for community access.     Time  12    Period  Weeks    Status  On-going    Target Date  05/30/18            Plan - 05/26/18 1341    Clinical Impression Statement  Patient appears to be functioning  at similar level as 3 weeks ago prior to [place] surgery. Pt appears would benefit from additional skilled PT care to improve function to basic community level. Patient in agreement.     Rehab Potential  Good    Clinical Impairments Affecting Rehab Potential  pt unable to read / write and sister who is caregiver reports limited ability to read / write    PT Frequency  3x / week   3x/wk for 4 weeks then 2x/wk for 8 weeks   PT Duration  12 weeks    PT Treatment/Interventions  ADLs/Self Care Home Management;DME Instruction;Gait training;Stair training;Functional mobility training;Therapeutic activities;Therapeutic exercise;Balance training;Neuromuscular re-education;Patient/family education;Prosthetic Training;Passive range of motion;Vestibular;Canalith Repostioning    PT Next Visit  Plan  check LTGs and recertify 1x/wk for 90 days.     Consulted and Agree with Plan of Care  Patient;Family member/caregiver    Family Member Consulted  Niece, [name]        Patient will benefit from skilled therapeutic intervention in order to improve the following deficits and impairments:  Abnormal gait, Decreased activity tolerance, Decreased balance, Decreased coordination, Decreased endurance, Decreased knowledge of use of DME, Decreased mobility, Decreased range of motion, Decreased scar mobility, Decreased strength, Impaired flexibility, Postural dysfunction, Obesity, Prosthetic Dependency  Visit Diagnosis: Muscle weakness (generalized)  Unsteadiness on feet  Other abnormalities of gait and mobility  Abnormal posture     Problem List Patient Active Problem List   Diagnosis Date Noted  . Atherosclerosis of native arteries of the extremities with gangrene ([place]) 11/01/2017  . Unilateral AKA, left ([place])   . Post-operative pain   . OSA (obstructive sleep apnea)   . Benign essential HTN   . Diabetes mellitus type 2 in obese ([place])   . Hyperkalemia   . Leukocytosis   . Acute blood loss anemia   . Amputation stump infection ([place]) 08/23/2017  . Amputation of left lower extremity above knee upon examination ([place]) 08/23/2017  . PAD (peripheral artery disease) ([place]) 07/23/2017  . Cellulitis 06/10/2017  . Hyponatremia 06/10/2017  . Fever 06/10/2017  . Rash 05/24/2017  . Diabetes ([place])   . HTN (hypertension)   . Allergic rhinitis   . Insomnia   . Depression     , PT, DPT 05/27/2018, 1:43 PM  [facility] [address] [place], [place], [address] Phone: [phone]   Fax:  [phone]  Name: Danielle Buchanan MRN: [id number] Date of Birth: July 27, 1956

## 2018-05-29 ENCOUNTER — Encounter: Payer: Self-pay | Admitting: Physical Therapy

## 2018-05-29 ENCOUNTER — Ambulatory Visit: Payer: Medicare HMO | Admitting: Physical Therapy

## 2018-05-29 DIAGNOSIS — M6281 Muscle weakness (generalized): Secondary | ICD-10-CM | POA: Diagnosis not present

## 2018-05-29 DIAGNOSIS — R293 Abnormal posture: Secondary | ICD-10-CM | POA: Diagnosis not present

## 2018-05-29 DIAGNOSIS — R2689 Other abnormalities of gait and mobility: Secondary | ICD-10-CM

## 2018-05-29 DIAGNOSIS — R2681 Unsteadiness on feet: Secondary | ICD-10-CM | POA: Diagnosis not present

## 2018-05-30 DIAGNOSIS — F331 Major depressive disorder, recurrent, moderate: Secondary | ICD-10-CM | POA: Diagnosis not present

## 2018-05-30 DIAGNOSIS — F5101 Primary insomnia: Secondary | ICD-10-CM | POA: Diagnosis not present

## 2018-05-30 NOTE — Therapy (Signed)
[facility] [address] [place], [place], [address] Phone: [phone]   Fax:  [phone]  Physical Therapy Treatment  Patient Details  Name: Danielle Buchanan MRN: [id number] Date of Birth: 1956-06-01 Referring Provider: Maurice Small, MD   Encounter Date: 05/29/2018  PT End of Session - 05/29/18 2159    Visit Number  21    Number of Visits  29    Date for PT Re-Evaluation  06/03/18    Authorization Type  [employer]    Authorization Time Period  $3400 OOP max has been met prior to PT evaluation. Visit limit Medicare guidelines    PT Start Time  5809    PT Stop Time  1055    PT Time Calculation (min)  40 min    Equipment Utilized During Treatment  Gait belt    Activity Tolerance  Patient tolerated treatment well;Patient limited by pain   limited by hand pain with increased pressure, rest breaks helped   Behavior During Therapy  WFL for tasks assessed/performed       Past Medical History:  Diagnosis Date  . Allergic rhinitis   . Depression   . Diabetes ([place])   . Dyslipidemia   . HTN (hypertension)   . Insomnia   . OSA (obstructive sleep apnea)    no cpap  . Urinary incontinence     Past Surgical History:  Procedure Laterality Date  . ABDOMINAL AORTOGRAM W/LOWER EXTREMITY N/A 06/24/2017   Procedure: ABDOMINAL AORTOGRAM W/LOWER EXTREMITY;  Surgeon: Waynetta Sandy, MD;  Location: [facility];  Service: Cardiovascular;  Laterality: N/A;  . ABDOMINAL AORTOGRAM W/LOWER EXTREMITY Right 10/09/2017   Procedure: ABDOMINAL AORTOGRAM W/LOWER EXTREMITY;  Surgeon: Waynetta Sandy, MD;  Location: [facility];  Service: Cardiovascular;  Laterality: Right;  . AMPUTATION Left 07/23/2017   Procedure: AMPUTATION  BELOW KNEE;  Surgeon: Waynetta Sandy, MD;  Location: [facility];  Service: Vascular;  Laterality: Left;  . AMPUTATION Left 08/12/2017   Procedure: REVISION BELOW KNEE;  Surgeon:  Waynetta Sandy, MD;  Location: [facility];  Service: Vascular;  Laterality: Left;  . AMPUTATION Left 08/27/2017   Procedure: left ABOVE KNEE AMPUTATION;  Surgeon: Waynetta Sandy, MD;  Location: [facility];  Service: Vascular;  Laterality: Left;  . AMPUTATION Left 08/30/2017   Procedure: REVISION AMPUTATION ABOVE KNEE;  Surgeon: Serafina Mitchell, MD;  Location: [facility];  Service: Vascular;  Laterality: Left;  . AMPUTATION Right 11/04/2017   Procedure: AMPUTATION RIGHT FOURTH TOE;  Surgeon: Waynetta Sandy, MD;  Location: [facility];  Service: Vascular;  Laterality: Right;  . APPLICATION OF WOUND VAC Left 08/27/2017   Procedure: APPLICATION OF WOUND VAC;  Surgeon: Waynetta Sandy, MD;  Location: [facility];  Service: Vascular;  Laterality: Left;  . CARPAL TUNNEL RELEASE Right   . COLONOSCOPY    . LOWER EXTREMITY INTERVENTION Right 10/09/2017   Procedure: LOWER EXTREMITY INTERVENTION;  Surgeon: Waynetta Sandy, MD;  Location: [facility];  Service: Cardiovascular;  Laterality: Right;    There were no vitals filed for this visit.  Subjective Assessment - 05/29/18 1015    Subjective  She has been wearing prosthetic liner but not prosthesis due to family not available to help. [name] reports plans to work more on her donning herself under Lisa's supervision.   (Pended)     Patient is accompained by:  Family member  (Pended)     Pertinent History  left  TFA, right 4th toe amputation, DM2, PAD, HTN, right carpal tunnel release,  (Pended)     Limitations  Lifting;Standing;Walking;House hold activities  (Pended)     Patient Stated Goals  Walk with prosthesis in home & in community  (Pended)     Currently in Pain?  No/denies  (Pended)     Pain Onset  More than a month ago  (Pended)                        [facility] Adult PT Treatment/Exercise - 05/29/18 1015      Transfers   Transfers  Sit to Stand;Stand to Sit    Sit to Stand  5: Supervision;With upper  extremity assist;From chair/3-in-1;With armrests   to RW   Stand to Sit  5: Supervision;With upper extremity assist;With armrests;To chair/3-in-1   to RW     Ambulation/Gait   Ambulation/Gait  Yes    Ambulation/Gait Assistance  5: Supervision    Ambulation/Gait Assistance Details  Pt reports does not feel like needs platform any longer. First 74' pt reported some Right hand tingling. Added hand splint to RW & 2nd gait including ramp & curb no tingling.     Ambulation Distance (Feet)  50 Feet   89' & 25' to curb   Assistive device  Prosthesis;Right platform walker    Gait Pattern  Step-to pattern;Decreased step length - right;Decreased stance time - left;Decreased stride length;Decreased weight shift to left;Left circumduction;Left hip hike;Antalgic;Lateral hip instability;Trunk flexed;Abducted - left;Poor foot clearance - left;Poor foot clearance - right    Ambulation Surface  Indoor;Level    Ramp  4: Min assist   RW with hand splint & locked TFA prosthesis   Ramp Details (indicate cue type and reason)  verbal & tactile cues on technique    Curb  4: Min assist   RW with hand splint & locked TFA prosthesis   Curb Details (indicate cue type and reason)  verbal & tactile cues on technique      Prosthetics   Current prosthetic wear tolerance (days/week)   Has not worn but 1 time since surgery 7/31    Current prosthetic wear tolerance (#hours/day)   wore 4hrs 2x/day    Residual limb condition   intact with no issues.     Education Provided  Proper Donning;Proper wear schedule/adjustment    Person(s) Educated  Patient;Caregiver(s)    Education Method  Explanation;Verbal cues    Education Method  Verbalized understanding;Verbal cues required;Needs further instruction    Donning Prosthesis  Minimal assist;Supervision    Doffing Prosthesis  Modified independent (device/increased time)               PT Short Term Goals - 05/30/18 0619      PT SHORT TERM GOAL #1   Title  Patient  able to donne prosthesis without assistance or verbal cues. (All STGs Target Date: 06/27/2018)    Baseline        Time  1    Period  Months    Status  Revised    Target Date  06/27/18      PT SHORT TERM GOAL #2   Title  Patient tolerates daily prosthesis wear >8hrs total / day.     Baseline        Time  1    Period  Months    Status  Revised    Target Date  06/27/18      PT SHORT TERM GOAL #3  Title  Patient able to retrieve object from floor with RW support with supervision.     Baseline        Time  1    Period  Months    Status  New    Target Date  06/27/18      PT SHORT TERM GOAL #4   Title  Patient ambulates 49' with rolling walker & locked prosthesis with supervision    Baseline        Time  1    Period  Months    Status  Revised    Target Date  06/27/18      PT SHORT TERM GOAL #5   Title  Patient negotiates ramp & curb with RW & locked prosthesis with minimal guard.     Time  1    Period  Months    Status  On-going    Target Date  06/27/18        PT Long Term Goals - 05/29/18 1603      PT LONG TERM GOAL #1   Title  Patient & family verbalize & demonstrate understanding of proper prosthetic care to enable safe use of prosthesis.  (All LTG Target Dates:  05/30/2018)    Baseline  Partially MET 05/29/2018 Patient verbalizes proper cleaning & limb care. She requires cues for adjusting ply socks. Niece donnes prosthesis correctly but pt requires cues & minA.     Time  12    Period  Weeks    Status  Partially Met      PT LONG TERM GOAL #2   Title  Patient tolerates wear of Transfemoral prosthesis >75% of awake hours without skin issues or limb pain.     Baseline  NOT MET 05/29/2018 Pt had progressed wear to 8 hrs of 14-16 hr day prior to RUE surgery 3 weeks ago. Only worn prosthesis 2x for ~4 hrs since sg.    Time  12    Period  Weeks    Status  Not Met      PT LONG TERM GOAL #3   Title  Standing balance with RW support: reaching 10" anteriorly, picks up object  from floor, scans environment & manages clothes for toileting modified indepenent.     Baseline  NOT MET but progressing 05/29/2018: Pt standing balance with RW support requires supervision with intermittent MinA.     Time  12    Period  Weeks    Status  Not Met      PT LONG TERM GOAL #4   Title  Patient ambulates 200' with RW & prosthesis with supervision for limited community mobility.     Baseline  NOT MET 05/29/2018 Pt ambulates 50' with RW & prosthesis with supervision. Prior to surgery pt ambulated up to 85'.    Time  12    Period  Weeks    Status  Not Met      PT LONG TERM GOAL #5   Title  Patient ambulates 58' around furniture with RW & prosthesis modified independent for household mobility.     Baseline  NOT MET 05/29/2018 Pt requires supervision for gait. She ambulates 68' with RW with supervision.    Time  12    Period  Weeks    Status  Not Met      PT LONG TERM GOAL #6   Title  Patient negotiates stairs with 2 rails, ramps & curbs with RW & prosthesis with minimal assist for community access.  Baseline  Partially MET 05/29/2018 Patient negotiates stairs 2 rails, ramps & curbs with RW minA but requires skilled assist / cues so unable to do with family at this time.    Time  12    Period  Weeks    Status  Partially Met         PT Long Term Goals - 05/30/18 6060      PT LONG TERM GOAL #1   Title  Patient & family verbalize & demonstrate understanding of proper prosthetic care & patient donnes independently to enable safe use of prosthesis.   (All LTG Target Dates: 08/27/2018)    Baseline        Time  3    Period  Months    Status  Revised    Target Date  08/27/18      PT LONG TERM GOAL #2   Title  Patient tolerates wear of Transfemoral prosthesis >75% of awake hours without skin issues or limb pain.     Baseline        Time  3    Period  Months    Status  On-going    Target Date  08/27/18      PT LONG TERM GOAL #3   Title  Standing balance with RW support:  reaching 10" anteriorly, picks up object from floor, scans environment & manages clothes for toileting modified indepenent.     Baseline        Time  3    Period  Months    Status  On-going    Target Date  08/27/18      PT LONG TERM GOAL #4   Title  Patient ambulates 200' with RW & prosthesis with family supervision for limited community mobility.     Baseline        Time  3    Period  Months    Status  Revised    Target Date  08/27/18      PT LONG TERM GOAL #5   Title  Patient ambulates 56' around furniture with RW & prosthesis modified independent for household mobility.     Baseline        Time  3    Period  Months    Status  On-going    Target Date  08/27/18      PT LONG TERM GOAL #6   Title  Patient negotiates stairs with 2 rails, ramps & curbs with RW & prosthesis with family assist for community access.     Baseline        Time  3    Period  Months    Status  Revised    Target Date  08/27/18           Plan - 05/29/18 1800    Clinical Impression Statement  This patient has made progress but did not fully meet LTGs or her potential for function with her Transfemoral Prosthesis. This episode of care for this patient was complicated by sudden unexpected death of her primary caregiver sister that she lived with; she is now living alone with neice /nephew-inlaw providing daily assistance and she also had RUE surgery 05/07/2018 & out of PT for 2 weeks with unable to use prosthesis. Patient has potential to use prosthesis independently in home & with family assist for basic community mobility with additional skilled PT care.    Rehab Potential  Good    Clinical Impairments Affecting Rehab Potential  pt unable to  read / write and sister who is caregiver reports limited ability to read / write    PT Frequency  1x/wk   PT Duration  12 weeks    PT Treatment/Interventions  ADLs/Self Care Home Management;DME Instruction;Gait training;Stair training;Functional mobility  training;Therapeutic activities;Therapeutic exercise;Balance training;Neuromuscular re-education;Patient/family education;Prosthetic Training;Passive range of motion;Vestibular;Canalith Repostioning    PT Next Visit Plan  work towards update STGs.     Consulted and Agree with Plan of Care  Patient;Family member/caregiver    Family Member Consulted  Niece, [name]        Patient will benefit from skilled therapeutic intervention in order to improve the following deficits and impairments:  Abnormal gait, Decreased activity tolerance, Decreased balance, Decreased coordination, Decreased endurance, Decreased knowledge of use of DME, Decreased mobility, Decreased range of motion, Decreased scar mobility, Decreased strength, Impaired flexibility, Postural dysfunction, Obesity, Prosthetic Dependency  Visit Diagnosis: Muscle weakness (generalized)  Unsteadiness on feet  Other abnormalities of gait and mobility  Abnormal posture     Problem List Patient Active Problem List   Diagnosis Date Noted  . Atherosclerosis of native arteries of the extremities with gangrene ([place]) 11/01/2017  . Unilateral AKA, left ([facility])   . Post-operative pain   . OSA (obstructive sleep apnea)   . Benign essential HTN   . Diabetes mellitus type 2 in obese ([facility])   . Hyperkalemia   . Leukocytosis   . Acute blood loss anemia   . Amputation stump infection ([place] [place]) 08/23/2017  . Amputation of left lower extremity above knee upon examination ([place] [facility]) 08/23/2017  . PAD (peripheral artery disease) ([facility] [place]) 07/23/2017  . Cellulitis 06/10/2017  . Hyponatremia 06/10/2017  . Fever 06/10/2017  . Rash 05/24/2017  . Diabetes ([place])   . HTN (hypertension)   . Allergic rhinitis   . Insomnia   . Depression     Vasti Yagi PT, DPT 05/30/2018, 6:31 AM  [facility] [address] [place], [place], [address] Phone: [phone]   Fax:  [phone]  Name: Lydiah Pong MRN: [id number] Date of Birth: [birth year]

## 2018-06-02 ENCOUNTER — Encounter: Payer: Medicare HMO | Admitting: Physical Therapy

## 2018-06-04 DIAGNOSIS — E1121 Type 2 diabetes mellitus with diabetic nephropathy: Secondary | ICD-10-CM | POA: Diagnosis not present

## 2018-06-04 DIAGNOSIS — Z89612 Acquired absence of left leg above knee: Secondary | ICD-10-CM | POA: Diagnosis not present

## 2018-06-04 DIAGNOSIS — S88012A Complete traumatic amputation at knee level, left lower leg, initial encounter: Secondary | ICD-10-CM | POA: Diagnosis not present

## 2018-06-04 DIAGNOSIS — R296 Repeated falls: Secondary | ICD-10-CM | POA: Diagnosis not present

## 2018-06-04 DIAGNOSIS — Z89619 Acquired absence of unspecified leg above knee: Secondary | ICD-10-CM | POA: Diagnosis not present

## 2018-06-04 DIAGNOSIS — T8189XA Other complications of procedures, not elsewhere classified, initial encounter: Secondary | ICD-10-CM | POA: Diagnosis not present

## 2018-06-16 ENCOUNTER — Encounter: Payer: Self-pay | Admitting: Physical Therapy

## 2018-06-16 ENCOUNTER — Ambulatory Visit: Payer: Medicare HMO | Attending: Family Medicine | Admitting: Physical Therapy

## 2018-06-16 DIAGNOSIS — M25652 Stiffness of left hip, not elsewhere classified: Secondary | ICD-10-CM | POA: Diagnosis not present

## 2018-06-16 DIAGNOSIS — R293 Abnormal posture: Secondary | ICD-10-CM | POA: Diagnosis not present

## 2018-06-16 DIAGNOSIS — M6281 Muscle weakness (generalized): Secondary | ICD-10-CM | POA: Diagnosis not present

## 2018-06-16 DIAGNOSIS — R2681 Unsteadiness on feet: Secondary | ICD-10-CM | POA: Diagnosis not present

## 2018-06-16 DIAGNOSIS — R2689 Other abnormalities of gait and mobility: Secondary | ICD-10-CM | POA: Insufficient documentation

## 2018-06-18 NOTE — Therapy (Signed)
[facility] [address] [place], [place], [address] Phone: [phone]   Fax:  [phone]  Physical Therapy Treatment  Patient Details  Name: Danielle Buchanan MRN: [id number] Date of Birth: 1956-09-22 Referring Provider: Maurice Small, MD   Encounter Date: 06/16/2018   06/16/18 1028  PT Visits / Re-Eval  Visit Number 22  Number of Visits 29  Date for PT Re-Evaluation 06/03/18  Authorization  Authorization Type [employer]  Authorization Time Period $3400 OOP max has been met prior to PT evaluation. Visit limit Medicare guidelines  PT Time Calculation  PT Start Time 7564 (pt late for appt)  PT Stop Time 1059  PT Time Calculation (min) 36 min  PT - End of Session  Equipment Utilized During Treatment Gait belt  Activity Tolerance Patient tolerated treatment well;Patient limited by pain (limited by hand pain with increased pressure, rest breaks helped)  Behavior During Therapy [facility] for tasks assessed/performed     Past Medical History:  Diagnosis Date  . Allergic rhinitis   . Depression   . Diabetes ([place])   . Dyslipidemia   . HTN (hypertension)   . Insomnia   . OSA (obstructive sleep apnea)    no cpap  . Urinary incontinence     Past Surgical History:  Procedure Laterality Date  . ABDOMINAL AORTOGRAM W/LOWER EXTREMITY N/A 06/24/2017   Procedure: ABDOMINAL AORTOGRAM W/LOWER EXTREMITY;  Surgeon: Waynetta Sandy, MD;  Location: [place];  Service: Cardiovascular;  Laterality: N/A;  . ABDOMINAL AORTOGRAM W/LOWER EXTREMITY Right 10/09/2017   Procedure: ABDOMINAL AORTOGRAM W/LOWER EXTREMITY;  Surgeon: Waynetta Sandy, MD;  Location: [place] [facility];  Service: Cardiovascular;  Laterality: Right;  . AMPUTATION Left 07/23/2017   Procedure: AMPUTATION  BELOW KNEE;  Surgeon: Waynetta Sandy, MD;  Location: [place];  Service: Vascular;  Laterality: Left;  . AMPUTATION Left  08/12/2017   Procedure: REVISION BELOW KNEE;  Surgeon: Waynetta Sandy, MD;  Location: [place] [facility];  Service: Vascular;  Laterality: Left;  . AMPUTATION Left 08/27/2017   Procedure: left ABOVE KNEE AMPUTATION;  Surgeon: Waynetta Sandy, MD;  Location: [place];  Service: Vascular;  Laterality: Left;  . AMPUTATION Left 08/30/2017   Procedure: REVISION AMPUTATION ABOVE KNEE;  Surgeon: Serafina Mitchell, MD;  Location: [place];  Service: Vascular;  Laterality: Left;  . AMPUTATION Right 11/04/2017   Procedure: AMPUTATION RIGHT FOURTH TOE;  Surgeon: Waynetta Sandy, MD;  Location: [place];  Service: Vascular;  Laterality: Right;  . APPLICATION OF WOUND VAC Left 08/27/2017   Procedure: APPLICATION OF WOUND VAC;  Surgeon: Waynetta Sandy, MD;  Location: [facility];  Service: Vascular;  Laterality: Left;  . CARPAL TUNNEL RELEASE Right   . COLONOSCOPY    . LOWER EXTREMITY INTERVENTION Right 10/09/2017   Procedure: LOWER EXTREMITY INTERVENTION;  Surgeon: Waynetta Sandy, MD;  Location: [place] [facility];  Service: Cardiovascular;  Laterality: Right;    There were no vitals filed for this visit.     06/16/18 1025  Symptoms/Limitations  Subjective Had a fall about 5 days ago. Was walking down the hallway and got tired. Her right knee gave way and she went backwards. Her neice was with her and unable to catch her/get wheelchair to her in time. The maintance man came and helped her up. Did have soreness on residual limb, the right knee cap. No more soreness today. Did not hit her head.   Patient is accompained by: Family  member  Pertinent History left TFA, right 4th toe amputation, DM2, PAD, HTN, right carpal tunnel release,  Limitations Lifting;Standing;Walking;House hold activities  Patient Stated Goals Walk with prosthesis in home & in community  Pain Assessment  Currently in Pain? No/denies  Pain Score 0      06/16/18 1029  Transfers  Transfers Sit to Stand;Stand to  Sit  Sit to Stand 5: Supervision;With upper extremity assist;From chair/3-in-1;With armrests  Stand to Sit 5: Supervision;With upper extremity assist;With armrests;To chair/3-in-1  Stand Pivot Transfers 4: Min assist  Stand Pivot Transfer Details (indicate cue type and reason) cues/min assist for sequencing/technique/posture with 90* pivot turns to sit in chair after gait.   Ambulation/Gait  Ambulation/Gait Yes  Ambulation/Gait Assistance 5: Supervision;4: Min guard  Ambulation/Gait Assistance Details using walker with built up handle on right with [place]. cues on posture, weight shifting onto prosthesis in stance                      Ambulation Distance (Feet) 75 Feet (x2)  Assistive device Rolling walker  Gait Pattern Step-to pattern;Decreased step length - right;Decreased stance time - left;Decreased stride length;Decreased weight shift to left;Left circumduction;Left hip hike;Antalgic;Lateral hip instability;Trunk flexed;Abducted - left;Poor foot clearance - left;Poor foot clearance - right  Ambulation Surface Level;Indoor  Prosthetics  Prosthetic Care Comments  Neice reports that the pt donned liner and prosthesis completely on her own today  Current prosthetic wear tolerance (days/week)  daily  Current prosthetic wear tolerance (#hours/day)  4hrs 2x/day  Residual limb condition  intact with no issues.   Education Provided Proper wear schedule/adjustment;Proper weight-bearing schedule/adjustment;Residual limb care  Person(s) Educated Patient;Caregiver(s)  Education Method Explanation;Demonstration;Verbal cues  Education Method Verbalized understanding;Returned demonstration;Verbal cues required;Needs further instruction  Donning Prosthesis 5  Doffing Prosthesis 6       PT Short Term Goals - 05/30/18 7846      PT SHORT TERM GOAL #1   Title  Patient able to donne prosthesis without assistance or verbal cues. (All STGs Target Date: 06/27/2018)    Baseline        Time  1    Period   Months    Status  Revised    Target Date  06/27/18      PT SHORT TERM GOAL #2   Title  Patient tolerates daily prosthesis wear >8hrs total / day.     Baseline        Time  1    Period  Months    Status  Revised    Target Date  06/27/18      PT SHORT TERM GOAL #3   Title  Patient able to retrieve object from floor with RW support with supervision.     Baseline        Time  1    Period  Months    Status  New    Target Date  06/27/18      PT SHORT TERM GOAL #4   Title  Patient ambulates 69' with rolling walker & locked prosthesis with supervision    Baseline        Time  1    Period  Months    Status  Revised    Target Date  06/27/18      PT SHORT TERM GOAL #5   Title  Patient negotiates ramp & curb with RW & locked prosthesis with minimal guard.     Time  1    Period  Months  Status  On-going    Target Date  06/27/18        PT Long Term Goals - 05/30/18 9381      PT LONG TERM GOAL #1   Title  Patient & family verbalize & demonstrate understanding of proper prosthetic care & patient donnes independently to enable safe use of prosthesis.   (All LTG Target Dates: 08/27/2018)    Baseline        Time  3    Period  Months    Status  Revised    Target Date  08/27/18      PT LONG TERM GOAL #2   Title  Patient tolerates wear of Transfemoral prosthesis >75% of awake hours without skin issues or limb pain.     Baseline        Time  3    Period  Months    Status  On-going    Target Date  08/27/18      PT LONG TERM GOAL #3   Title  Standing balance with RW support: reaching 10" anteriorly, picks up object from floor, scans environment & manages clothes for toileting modified indepenent.     Baseline        Time  3    Period  Months    Status  On-going    Target Date  08/27/18      PT LONG TERM GOAL #4   Title  Patient ambulates 200' with RW & prosthesis with family supervision for limited community mobility.     Baseline        Time  3    Period  Months     Status  Revised    Target Date  08/27/18      PT LONG TERM GOAL #5   Title  Patient ambulates 66' around furniture with RW & prosthesis modified independent for household mobility.     Baseline        Time  3    Period  Months    Status  On-going    Target Date  08/27/18      PT LONG TERM GOAL #6   Title  Patient negotiates stairs with 2 rails, ramps & curbs with RW & prosthesis with family assist for community access.     Baseline        Time  3    Period  Months    Status  Revised    Target Date  08/27/18          06/16/18 1029  Plan  Clinical Impression Statement Today skilled session continued to focus on transfers and gait with prosthesis/RW. Pt was able to progress gait distance today with less cues/assistance needed. Does continue to need cues/assist for safety with pivot transfers to sit down after gait, especially if tired. The pt is progressing toward goals and should benefit from continued PT to progress toward unmet goals.                                                 Pt will benefit from skilled therapeutic intervention in order to improve on the following deficits Abnormal gait;Decreased activity tolerance;Decreased balance;Decreased coordination;Decreased endurance;Decreased knowledge of use of DME;Decreased mobility;Decreased range of motion;Decreased scar mobility;Decreased strength;Impaired flexibility;Postural dysfunction;Obesity;Prosthetic Dependency  Rehab Potential Good  Clinical Impairments Affecting Rehab Potential pt unable to read /  write and sister who is caregiver reports limited ability to read / write  PT Frequency 1x / week  PT Duration 12 weeks  PT Treatment/Interventions ADLs/Self Care Home Management;DME Instruction;Gait training;Stair training;Functional mobility training;Therapeutic activities;Therapeutic exercise;Balance training;Neuromuscular re-education;Patient/family education;Prosthetic Training;Passive range of motion;Vestibular;Canalith  Repostioning  PT Next Visit Plan work towards update STGs.   Consulted and Agree with Plan of Care Patient;Family member/caregiver  Family Member Consulted Niece, [name]          Patient will benefit from skilled therapeutic intervention in order to improve the following deficits and impairments:  Abnormal gait, Decreased activity tolerance, Decreased balance, Decreased coordination, Decreased endurance, Decreased knowledge of use of DME, Decreased mobility, Decreased range of motion, Decreased scar mobility, Decreased strength, Impaired flexibility, Postural dysfunction, Obesity, Prosthetic Dependency  Visit Diagnosis: Muscle weakness (generalized)  Unsteadiness on feet  Other abnormalities of gait and mobility     Problem List Patient Active Problem List   Diagnosis Date Noted  . Atherosclerosis of native arteries of the extremities with gangrene ([place]) 11/01/2017  . Unilateral AKA, left ([place])   . Post-operative pain   . OSA (obstructive sleep apnea)   . Benign essential HTN   . Diabetes mellitus type 2 in obese ([place])   . Hyperkalemia   . Leukocytosis   . Acute blood loss anemia   . Amputation stump infection ([place]) 08/23/2017  . Amputation of left lower extremity above knee upon examination ([place]) 08/23/2017  . PAD (peripheral artery disease) ([place]) 07/23/2017  . Cellulitis 06/10/2017  . Hyponatremia 06/10/2017  . Fever 06/10/2017  . Rash 05/24/2017  . Diabetes ([place])   . HTN (hypertension)   . Allergic rhinitis   . Insomnia   . Depression     [place], [place], [facility] [address] [place], [place] [address] [phone] 06/18/18, 10:07 PM   Name: Danielle Buchanan MRN: [id number] Date of Birth: [birth year]

## 2018-06-23 ENCOUNTER — Encounter: Payer: Self-pay | Admitting: Physical Therapy

## 2018-06-23 ENCOUNTER — Ambulatory Visit: Payer: Medicare HMO | Admitting: Physical Therapy

## 2018-06-23 ENCOUNTER — Other Ambulatory Visit: Payer: Self-pay

## 2018-06-23 DIAGNOSIS — R2689 Other abnormalities of gait and mobility: Secondary | ICD-10-CM | POA: Diagnosis not present

## 2018-06-23 DIAGNOSIS — R293 Abnormal posture: Secondary | ICD-10-CM | POA: Diagnosis not present

## 2018-06-23 DIAGNOSIS — R2681 Unsteadiness on feet: Secondary | ICD-10-CM

## 2018-06-23 DIAGNOSIS — M6281 Muscle weakness (generalized): Secondary | ICD-10-CM | POA: Diagnosis not present

## 2018-06-23 DIAGNOSIS — M25652 Stiffness of left hip, not elsewhere classified: Secondary | ICD-10-CM | POA: Diagnosis not present

## 2018-06-23 NOTE — Patient Outreach (Signed)
[facility] Care Management  06/23/2018  Danielle ClassJanice Buchanan Mar 06, 1956 [id number]   Referral Date: 06/23/18 [facility]erral Source[facility] Referral Reason: Extra help in the home   Outreach Attempt: no answer. HIPAA compliant voice message left.   Plan: Will send letter and attempt patient again within 4 business days.     Danielle Lericheionne J Jamarious Febo, RN, MSN [facility] Care [facility] Care Management Coordinator Direct Line [phone] Toll Free: [phone]  Fax: [phone]

## 2018-06-23 NOTE — Therapy (Signed)
[facility] [address] [place], [place], [address] Phone: [phone]   Fax:  [phone]  Physical Therapy Treatment  Patient Details  Name: Danielle Buchanan MRN: [id number] Date of Birth: 1956/01/08 Referring Provider: Maurice Small, MD   Encounter Date: 06/23/2018  PT End of Session - 06/23/18 1105    Visit Number  23    Number of Visits  29    Date for PT Re-Evaluation  06/03/18    Authorization Type  [employer] Medicare    Authorization Time Period  $3400 OOP max has been met prior to PT evaluation. Visit limit Medicare guidelines    PT Start Time  1020    PT Stop Time  1103    PT Time Calculation (min)  43 min    Equipment Utilized During Treatment  Gait belt    Activity Tolerance  Patient tolerated treatment well;Patient limited by pain   limited by hand pain with increased pressure, rest breaks helped   Behavior During Therapy  WFL for tasks assessed/performed       Past Medical History:  Diagnosis Date  . Allergic rhinitis   . Depression   . Diabetes ([place])   . Dyslipidemia   . HTN (hypertension)   . Insomnia   . OSA (obstructive sleep apnea)    no cpap  . Urinary incontinence     Past Surgical History:  Procedure Laterality Date  . ABDOMINAL AORTOGRAM W/LOWER EXTREMITY N/A 06/24/2017   Procedure: ABDOMINAL AORTOGRAM W/LOWER EXTREMITY;  Surgeon: Waynetta Sandy, MD;  Location: [facility];  Service: Cardiovascular;  Laterality: N/A;  . ABDOMINAL AORTOGRAM W/LOWER EXTREMITY Right 10/09/2017   Procedure: ABDOMINAL AORTOGRAM W/LOWER EXTREMITY;  Surgeon: Waynetta Sandy, MD;  Location: [place] [facility];  Service: Cardiovascular;  Laterality: Right;  . AMPUTATION Left 07/23/2017   Procedure: AMPUTATION  BELOW KNEE;  Surgeon: Waynetta Sandy, MD;  Location: [facility];  Service: Vascular;  Laterality: Left;  . AMPUTATION Left 08/12/2017   Procedure: REVISION BELOW KNEE;  Surgeon:  Waynetta Sandy, MD;  Location: [facility] [place];  Service: Vascular;  Laterality: Left;  . AMPUTATION Left 08/27/2017   Procedure: left ABOVE KNEE AMPUTATION;  Surgeon: Waynetta Sandy, MD;  Location: [facility];  Service: Vascular;  Laterality: Left;  . AMPUTATION Left 08/30/2017   Procedure: REVISION AMPUTATION ABOVE KNEE;  Surgeon: Serafina Mitchell, MD;  Location: [place];  Service: Vascular;  Laterality: Left;  . AMPUTATION Right 11/04/2017   Procedure: AMPUTATION RIGHT FOURTH TOE;  Surgeon: Waynetta Sandy, MD;  Location: [facility];  Service: Vascular;  Laterality: Right;  . APPLICATION OF WOUND VAC Left 08/27/2017   Procedure: APPLICATION OF WOUND VAC;  Surgeon: Waynetta Sandy, MD;  Location: [facility];  Service: Vascular;  Laterality: Left;  . CARPAL TUNNEL RELEASE Right   . COLONOSCOPY    . LOWER EXTREMITY INTERVENTION Right 10/09/2017   Procedure: LOWER EXTREMITY INTERVENTION;  Surgeon: Waynetta Sandy, MD;  Location: [place] [facility];  Service: Cardiovascular;  Laterality: Right;    There were no vitals filed for this visit.  Subjective Assessment - 06/23/18 1033    Subjective  No falls but near fall transferring to bed without prosthesis and twisted her ankle.     Patient is accompained by:  Family member    Pertinent History  left TFA, right 4th toe amputation, DM2, PAD, HTN, right carpal tunnel release,    Limitations  Lifting;Standing;Walking;House hold activities  Patient Stated Goals  Walk with prosthesis in home & in community    Currently in Pain?  Yes    Pain Score  5     Pain Location  Ankle    Pain Orientation  Right    Pain Descriptors / Indicators  Dull;Aching    Pain Type  Acute pain    Pain Onset  In the past 7 days    Pain Frequency  Intermittent    Aggravating Factors   standing on it    Pain Relieving Factors  sitting, ice, rest                       [facility] Adult PT Treatment/Exercise - 06/23/18 1105       Transfers   Transfers  Sit to Stand;Stand to [employer] Transfers    Sit to Stand  4: Min guard;5: Supervision;With upper extremity assist;With armrests;From chair/3-in-1   to RW locking prosthesis upon arising   Sit to Stand Details (indicate cue type and reason)  Min guard & increased time to arise with LE weakness    Stand to Sit  5: Supervision;With upper extremity assist;With armrests;To chair/3-in-1   from RW with TFA locked   Stand to Sit Details  cues to reach RUE to chair    Stand Pivot Transfers  4: Min guard   RW & TFA locked prosthesis   Stand Pivot Transfer Details (indicate cue type and reason)  verbal & tactile cues on wt shift to prosthesis & stepping /lifting RLE with movement (not pivoting)      Ambulation/Gait   Ambulation/Gait  Yes    Ambulation/Gait Assistance  5: Supervision;4: Min guard    Ambulation/Gait Assistance Details  verbal cues on posture & wt shift. PT demo how to follow with w/c when working on "long" walk.  Niece verbalized understanding.     Ambulation Distance (Feet)  34 Feet    Assistive device  Rolling walker;Prosthesis    Ambulation Surface  Indoor;Level             PT Education - 06/23/18 1034    Education Details  rest, ice, compression & elevation for right ankle;  increasing activity level with high frequency short walks, 1-2 long (her max) walks & 3-5 medium walks.   (Pended)     Person(s) Educated  Patient;Caregiver(s)  (Pended)     Methods  Explanation  (Pended)     Comprehension  Verbalized understanding  (Pended)        PT Short Term Goals - 06/23/18 1124      PT SHORT TERM GOAL #1   Title  Patient able to donne prosthesis without assistance or verbal cues. (All STGs Target Date: 06/27/2018)    Baseline  NOT MET 06/23/2018 patient requires verbal cues / reminders to tighten suspension strap    Time  1    Period  Months    Status  Not Met      PT SHORT TERM GOAL #2   Title  Patient tolerates daily prosthesis wear >8hrs  total / day.     Baseline  06/23/2018 NOT MET pt is experiencing increased depression over last few weeks with birthday of recently deceased sister and spending more time in bed.    Time  1    Period  Months    Status  Not Met      PT SHORT TERM GOAL #3   Title  Patient able to retrieve object from  floor with RW support with supervision.     Baseline  NOT MET 06/23/2018    Time  1    Period  Months    Status  Not Met      PT SHORT TERM GOAL #4   Title  Patient ambulates 76' with rolling walker & locked prosthesis with supervision    Baseline  NOT MET 06/23/2018  Pt ambulated 34' with RW & locked prosthesis with min guard.     Time  1    Period  Months    Status  Not Met      PT SHORT TERM GOAL #5   Title  Patient negotiates ramp & curb with RW & locked prosthesis with minimal guard.     Baseline  NOT MET 06/23/2018    Time  1    Period  Months    Status  Not Met       PT Short Term Goals - 06/23/18 1148      PT SHORT TERM GOAL #1   Title  Patient able to donne prosthesis without assistance or verbal cues. (All STGs Target Date: 07/25/2018)    Time  1    Period  Months    Status  On-going    Target Date  07/25/18      PT SHORT TERM GOAL #2   Title  Patient tolerates daily prosthesis wear >8hrs total / day.     Time  1    Period  Months    Status  On-going    Target Date  07/25/18      PT SHORT TERM GOAL #3   Title  Patient able to retrieve object from floor with RW support with supervision.     Time  1    Period  Months    Status  On-going    Target Date  07/25/18      PT SHORT TERM GOAL #4   Title  Patient ambulates 73' with rolling walker & locked prosthesis with supervision    Baseline       Time  1    Period  Months    Status  On-going    Target Date  07/25/18      PT SHORT TERM GOAL #5   Title  Patient negotiates ramp & curb with RW & locked prosthesis with minimal guard.     Baseline        Time  1    Period  Months    Status  On-going    Target Date   07/25/18         PT Long Term Goals - 05/30/18 3295      PT LONG TERM GOAL #1   Title  Patient & family verbalize & demonstrate understanding of proper prosthetic care & patient donnes independently to enable safe use of prosthesis.   (All LTG Target Dates: 08/27/2018)    Baseline        Time  3    Period  Months    Status  Revised    Target Date  08/27/18      PT LONG TERM GOAL #2   Title  Patient tolerates wear of Transfemoral prosthesis >75% of awake hours without skin issues or limb pain.     Baseline        Time  3    Period  Months    Status  On-going    Target Date  08/27/18  PT LONG TERM GOAL #3   Title  Standing balance with RW support: reaching 10" anteriorly, picks up object from floor, scans environment & manages clothes for toileting modified indepenent.     Baseline        Time  3    Period  Months    Status  On-going    Target Date  08/27/18      PT LONG TERM GOAL #4   Title  Patient ambulates 200' with RW & prosthesis with family supervision for limited community mobility.     Baseline        Time  3    Period  Months    Status  Revised    Target Date  08/27/18      PT LONG TERM GOAL #5   Title  Patient ambulates 66' around furniture with RW & prosthesis modified independent for household mobility.     Baseline        Time  3    Period  Months    Status  On-going    Target Date  08/27/18      PT LONG TERM GOAL #6   Title  Patient negotiates stairs with 2 rails, ramps & curbs with RW & prosthesis with family assist for community access.     Baseline        Time  3    Period  Months    Status  Revised    Target Date  08/27/18            Plan - 06/23/18 1129    Clinical Impression Statement  Per pt and niece who is primary caregiver, Mykeisha is experiencing increased depression with birthday of sister that she lived with who died. With increased depression, she has been more sedentry and some deconditioning. She did not meet any STGs  but has potential with skilled care.     Rehab Potential  Good    Clinical Impairments Affecting Rehab Potential  pt unable to read / write and sister who is caregiver reports limited ability to read / write    PT Frequency  1x / week    PT Duration  12 weeks    PT Treatment/Interventions  ADLs/Self Care Home Management;DME Instruction;Gait training;Stair training;Functional mobility training;Therapeutic activities;Therapeutic exercise;Balance training;Neuromuscular re-education;Patient/family education;Prosthetic Training;Passive range of motion;Vestibular;Canalith Repostioning    PT Next Visit Plan  prosthetic training towards STGs.     Consulted and Agree with Plan of Care  Patient;Family member/caregiver    Family Member Consulted  Niece, [name]        Patient will benefit from skilled therapeutic intervention in order to improve the following deficits and impairments:  Abnormal gait, Decreased activity tolerance, Decreased balance, Decreased coordination, Decreased endurance, Decreased knowledge of use of DME, Decreased mobility, Decreased range of motion, Decreased scar mobility, Decreased strength, Impaired flexibility, Postural dysfunction, Obesity, Prosthetic Dependency  Visit Diagnosis: Muscle weakness (generalized)  Unsteadiness on feet  Other abnormalities of gait and mobility     Problem List Patient Active Problem List   Diagnosis Date Noted  . Atherosclerosis of native arteries of the extremities with gangrene ([place]) 11/01/2017  . Unilateral AKA, left ([place])   . Post-operative pain   . OSA (obstructive sleep apnea)   . Benign essential HTN   . Diabetes mellitus type 2 in obese ([place])   . Hyperkalemia   . Leukocytosis   . Acute blood loss anemia   . Amputation stump infection ([place]) 08/23/2017  .  Amputation of left lower extremity above knee upon examination ([place]) 08/23/2017  . PAD (peripheral artery disease) ([place]) 07/23/2017  . Cellulitis 06/10/2017  . Hyponatremia  06/10/2017  . Fever 06/10/2017  . Rash 05/24/2017  . Diabetes ([place])   . HTN (hypertension)   . Allergic rhinitis   . Insomnia   . Depression     Nimah Uphoff PT, DPT 06/23/2018, 11:47 AM  [facility] [address] [place], [place], [address] Phone: [phone]   Fax:  [phone]  Name: Danielle Buchanan MRN: [id number] Date of Birth: Oct 02, 1956

## 2018-06-26 ENCOUNTER — Other Ambulatory Visit: Payer: Self-pay

## 2018-06-26 NOTE — Patient Outreach (Signed)
[facility](THN) Care Management  06/26/2018  Danielle ClassJanice Buchanan 1956-04-25 [id number]   Referral Date: 06/23/18 [facility] Reason: Home Therapy and help in the home.   Outreach Attempt: Spoke with patient.  She is able to verify HIPAA.  Patient reports that she needs some help with walking at home and that she needs help with light housekeeping.  Patient lives alone and her sister recently passed away.  Her niece and nephew check in on her and her niece takes to appointments and makes all her appointments.  Patient doing therapy as an outpatient once a week.  Advised patient that if she has therapy as an outpatient that her insurance would not cover in home therapy as well. She verbalized understanding.  Discussed with patient resources for light housekeeping.  Patient not sure if she qualifies for medicaid or ever applied.  Discussed with [facility] and that they could assist with seeing if she qualifies for medicaid and give her resources for housekeeping if she does not qualify for medicaid.  Patient PHQ-9 = 18.  Patient is mourning the loss of her sister and the fact that her dog is dying.  Patient reports she is on multiple antidepressants.  Patient is agreeable to social work referral.    Patient admits to depression, DM, HTN, and is a left AKA with prosthesis.  Patient reports that she has had diabetes for many years and just does not eat what she is supposed to.  She states that her sugars are running between 150-300.   Discussed with patient [facility].  Patient declined right presently.      Patient reports that she takes her medications as prescribed and able to afford medications.    Plan: RN CM will refer patient to social work for depression,  medicaid application and light housekeeping resources.     Bary Lericheionne J Janeva Peaster, RN, MSN [facility] Care Management Care Management Coordinator Direct Line [phone] Toll Free:  [phone]  Fax: [phone]

## 2018-06-27 ENCOUNTER — Encounter: Payer: Self-pay | Admitting: *Deleted

## 2018-06-27 ENCOUNTER — Ambulatory Visit: Payer: Self-pay

## 2018-06-27 ENCOUNTER — Other Ambulatory Visit: Payer: Self-pay | Admitting: *Deleted

## 2018-06-27 NOTE — Patient Outreach (Signed)
[facility](THN) Care Management  06/27/2018  Danielle ClassJanice Buchanan 10-12-1955 [id number]   CSW was able to make initial contact with patient today to perform phone assessment, as well as assess and assist with social work needs and services.  CSW introduced self, explained role and types of services provided through [facility] Care Management [facility](THN Care Management).  CSW further explained to patient that CSW works with patient's Telephonic RNCM, also with [facility] Care [facility], Dionne Leath. CSW then explained the reason for the call, indicating that Mrs. Danielle Buchanan thought that patient would benefit from social work services and resources to assist with applying for Adult [place] [facility], providing counseling and supportive services for symptoms of depression and assisting with arranging in-home care services to provide light house-keeping duties.  CSW obtained two HIPAA compliant identifiers from patient, which included patient's name and date of birth. Patient admits that she is definitely interested in receiving all of the above named services.  CSW explained to patient that CSW will have Bevelyn NgoKendra Humble, CSW colleague, also with [facility] Care Management, contact her regarding applying for Adult Medicaid, as well as assisting her with arranging in-home care services.  CSW agreed to perform a home visit with patient to provide counseling and supportive services for symptoms of depression.  The initial home visit has been scheduled for Thursday, July 03, 2018 at 9:00AM.  CSW will print EMMI information for patient, pertaining specifically to "Signs and Symptoms of Depression" to provide to patient during the initial home visit.  CSW was able to ensure that patient has the correct contact information for CSW, encouraging patient to contact CSW directly if anything changes or if she needs assistance in the meantime.  Patient  voiced understanding and was agreeable to this plan. THN CM Care Plan Problem One     Most Recent Value  Care Plan Problem One  Patient is experiencing symptoms of depression.  Role Documenting the Problem One  Clinical Social Worker  Care Plan for Problem One  Active  [facility] Long Term Goal   Patient will have a reduction in her symptoms of depression, through counseling and supportive services, within the next 45 days.  THN Long Term Goal Start Date  06/27/18  Interventions for Problem One Long Term Goal  CSW will provide counseling and supportive services to patient to help patient better cope with symptoms of depression.  THN CM Short Term Goal #1   Patient will meet with CSW for an initial home visit to discuss symptoms of depression so that CSW can provide counseling and supportive services, within the next week.  THN CM Short Term Goal #1 Start Date  06/27/18  Interventions for Short Term Goal #1  CSW was able to schedule an initial home visit with patient for Thursday, July 03, 2018 at 9:00AM to provide counseling and supportive services.  THN CM Short Term Goal #2   Patient will review literature provided to her by CSW related to "Signs and Symptoms of Depression" for her review, within the next two weeks.  THN CM Short Term Goal #2 Start Date  06/27/18  Interventions for Short Term Goal #2  CSW will print EMMI information for patient regards depression.  THN CM Short Term Goal #3  Patient will practice deep breathing exercises and relaxation techniques to help cope with symptoms of depression, within the next 30 days.  THN CM Short Term Goal #3 Start Date  06/27/18  Interventions for Short Tern Goal #3  CSW will teach patient, as well as provide literature, on proper use of deep breathing exercises and relaxation techniques.    Danford Bad, BSW, MSW, LCSW  Licensed Restaurant manager, fast food [facility] System  Mailing [place] [address], [place], [place] [address] Physical Address-[address], [place], [place] [address] Toll Free Main # [phone] Fax # [phone] Cell # [phone]  Office # [phone] [email or web address]

## 2018-06-30 ENCOUNTER — Ambulatory Visit: Payer: Medicare HMO | Admitting: Physical Therapy

## 2018-06-30 ENCOUNTER — Encounter: Payer: Self-pay | Admitting: Physical Therapy

## 2018-06-30 DIAGNOSIS — M6281 Muscle weakness (generalized): Secondary | ICD-10-CM

## 2018-06-30 DIAGNOSIS — R2681 Unsteadiness on feet: Secondary | ICD-10-CM

## 2018-06-30 DIAGNOSIS — R2689 Other abnormalities of gait and mobility: Secondary | ICD-10-CM | POA: Diagnosis not present

## 2018-06-30 DIAGNOSIS — R293 Abnormal posture: Secondary | ICD-10-CM | POA: Diagnosis not present

## 2018-06-30 DIAGNOSIS — M25652 Stiffness of left hip, not elsewhere classified: Secondary | ICD-10-CM | POA: Diagnosis not present

## 2018-06-30 NOTE — Therapy (Signed)
[facility] [address] [place], [place], [address] Phone: [phone]   Fax:  [phone]  Physical Therapy Treatment  Patient Details  Name: Danielle Buchanan MRN: [id number] Date of Birth: [birth year] Referring Provider: Maurice Small, MD   Encounter Date: 06/30/2018  PT End of Session - 06/30/18 1545    Visit Number  24    Number of Visits  29    Date for PT Re-Evaluation  06/03/18    Authorization Type  [employer] Medicare    Authorization Time Period  $3400 OOP max has been met prior to PT evaluation. Visit limit Medicare guidelines    PT Start Time  7510    PT Stop Time  1100    PT Time Calculation (min)  42 min    Equipment Utilized During Treatment  Gait belt    Activity Tolerance  Patient tolerated treatment well;Patient limited by pain   limited by hand pain with increased pressure, rest breaks helped   Behavior During Therapy  WFL for tasks assessed/performed       Past Medical History:  Diagnosis Date  . Allergic rhinitis   . Depression   . Diabetes ([place])   . Dyslipidemia   . HTN (hypertension)   . Insomnia   . OSA (obstructive sleep apnea)    no cpap  . Urinary incontinence     Past Surgical History:  Procedure Laterality Date  . ABDOMINAL AORTOGRAM W/LOWER EXTREMITY N/A 06/24/2017   Procedure: ABDOMINAL AORTOGRAM W/LOWER EXTREMITY;  Surgeon: Waynetta Sandy, MD;  Location: [place] [facility];  Service: Cardiovascular;  Laterality: N/A;  . ABDOMINAL AORTOGRAM W/LOWER EXTREMITY Right 10/09/2017   Procedure: ABDOMINAL AORTOGRAM W/LOWER EXTREMITY;  Surgeon: Waynetta Sandy, MD;  Location: [place] [facility];  Service: Cardiovascular;  Laterality: Right;  . AMPUTATION Left 07/23/2017   Procedure: AMPUTATION  BELOW KNEE;  Surgeon: Waynetta Sandy, MD;  Location: [place];  Service: Vascular;  Laterality: Left;  . AMPUTATION Left 08/12/2017   Procedure: REVISION BELOW KNEE;  Surgeon:  Waynetta Sandy, MD;  Location: [place];  Service: Vascular;  Laterality: Left;  . AMPUTATION Left 08/27/2017   Procedure: left ABOVE KNEE AMPUTATION;  Surgeon: Waynetta Sandy, MD;  Location: [place];  Service: Vascular;  Laterality: Left;  . AMPUTATION Left 08/30/2017   Procedure: REVISION AMPUTATION ABOVE KNEE;  Surgeon: Serafina Mitchell, MD;  Location: [place];  Service: Vascular;  Laterality: Left;  . AMPUTATION Right 11/04/2017   Procedure: AMPUTATION RIGHT FOURTH TOE;  Surgeon: Waynetta Sandy, MD;  Location: [place];  Service: Vascular;  Laterality: Right;  . APPLICATION OF WOUND VAC Left 08/27/2017   Procedure: APPLICATION OF WOUND VAC;  Surgeon: Waynetta Sandy, MD;  Location: [place];  Service: Vascular;  Laterality: Left;  . CARPAL TUNNEL RELEASE Right   . COLONOSCOPY    . LOWER EXTREMITY INTERVENTION Right 10/09/2017   Procedure: LOWER EXTREMITY INTERVENTION;  Surgeon: Waynetta Sandy, MD;  Location: [place] [facility];  Service: Cardiovascular;  Laterality: Right;    There were no vitals filed for this visit.  Subjective Assessment - 06/30/18 1027    Subjective  No falls. Prosthetist adjusted prosthesis.     Patient is accompained by:  Family member    Pertinent History  left TFA, right 4th toe amputation, DM2, PAD, HTN, right carpal tunnel release,    Limitations  Lifting;Standing;Walking;House hold activities    Patient Stated Goals  Walk with prosthesis  in home & in community    Currently in Pain?  No/denies    Pain Score  0-No pain                       [facility] Adult PT Treatment/Exercise - 06/30/18 1015      Transfers   Transfers  Sit to Stand;Stand to [employer] Transfers    Sit to Stand  5: Supervision;With upper extremity assist;With armrests;From chair/3-in-1   to RW & rollator locking prosthesis upon arising   Stand to Sit  5: Supervision;With upper extremity assist;With armrests;To chair/3-in-1   from  RW & rollator with TFA locked   Stand Pivot Transfers  4: Min guard   to/from rollator seat with TFA locked prosthesis   Stand Pivot Transfer Details (indicate cue type and reason)  PT demo & instructed in technique with rollator walker with rollator stabilized against wall.        Ambulation/Gait   Ambulation/Gait  Yes    Ambulation/Gait Assistance  5: Supervision    Ambulation/Gait Assistance Details  verbal cues on upper body posture & weight on hands on RW - looking up when ambulating. Rollator walker use & safety.      Ambulation Distance (Feet)  75 Feet   67' RW & 60' X 2 rollator   Assistive device  Rolling walker;Prosthesis;Rollator    Ambulation Surface  Indoor;Level    Ramp  4: Min assist   RW & TFA locked prosthesis   Ramp Details (indicate cue type and reason)  verbal & tactile cues on technique    Curb  4: Min assist   RW & TFA locked prosthesis   Curb Details (indicate cue type and reason)  verbal & tactile cues on technique      Self-Care   ADL's  picking up objects from floor with RW support with supervision.       Prosthetics   Current prosthetic wear tolerance (days/week)   daily    Current prosthetic wear tolerance (#hours/day)   4-5 hrs 2x/day               PT Short Term Goals - 06/23/18 1148      PT SHORT TERM GOAL #1   Title  Patient able to donne prosthesis without assistance or verbal cues. (All STGs Target Date: 07/25/2018)    Time  1    Period  Months    Status  On-going    Target Date  07/25/18      PT SHORT TERM GOAL #2   Title  Patient tolerates daily prosthesis wear >8hrs total / day.     Time  1    Period  Months    Status  On-going    Target Date  07/25/18      PT SHORT TERM GOAL #3   Title  Patient able to retrieve object from floor with RW support with supervision.     Time  1    Period  Months    Status  On-going    Target Date  07/25/18      PT SHORT TERM GOAL #4   Title  Patient ambulates 16' with rolling walker &  locked prosthesis with supervision    Baseline       Time  1    Period  Months    Status  On-going    Target Date  07/25/18      PT SHORT TERM GOAL #5  Title  Patient negotiates ramp & curb with RW & locked prosthesis with minimal guard.     Baseline        Time  1    Period  Months    Status  On-going    Target Date  07/25/18        PT Long Term Goals - 05/30/18 1610      PT LONG TERM GOAL #1   Title  Patient & family verbalize & demonstrate understanding of proper prosthetic care & patient donnes independently to enable safe use of prosthesis.   (All LTG Target Dates: 08/27/2018)    Baseline        Time  3    Period  Months    Status  Revised    Target Date  08/27/18      PT LONG TERM GOAL #2   Title  Patient tolerates wear of Transfemoral prosthesis >75% of awake hours without skin issues or limb pain.     Baseline        Time  3    Period  Months    Status  On-going    Target Date  08/27/18      PT LONG TERM GOAL #3   Title  Standing balance with RW support: reaching 10" anteriorly, picks up object from floor, scans environment & manages clothes for toileting modified indepenent.     Baseline        Time  3    Period  Months    Status  On-going    Target Date  08/27/18      PT LONG TERM GOAL #4   Title  Patient ambulates 200' with RW & prosthesis with family supervision for limited community mobility.     Baseline        Time  3    Period  Months    Status  Revised    Target Date  08/27/18      PT LONG TERM GOAL #5   Title  Patient ambulates 45' around furniture with RW & prosthesis modified independent for household mobility.     Baseline        Time  3    Period  Months    Status  On-going    Target Date  08/27/18      PT LONG TERM GOAL #6   Title  Patient negotiates stairs with 2 rails, ramps & curbs with RW & prosthesis with family assist for community access.     Baseline        Time  3    Period  Months    Status  Revised    Target Date   08/27/18            Plan - 06/30/18 1545    Clinical Impression Statement  patient appeared to have more energy today as she seemed to have less symptoms of depression.  Pt improved mobility with prosthesis with RW today with longer distances. Pt wanted to try rollator walker and appears with further training would be safe option to enable resting point for community & ability to transport items.     Rehab Potential  Good    Clinical Impairments Affecting Rehab Potential  pt unable to read / write and sister who is caregiver reports limited ability to read / write    PT Frequency  1x / week    PT Duration  12 weeks    PT Treatment/Interventions  ADLs/Self Care  Home Management;DME Instruction;Gait training;Stair training;Functional mobility training;Therapeutic activities;Therapeutic exercise;Balance training;Neuromuscular re-education;Patient/family education;Prosthetic Training;Passive range of motion;Vestibular;Canalith Repostioning    PT Next Visit Plan  prosthetic training towards STGs. Continue weekly work with Teaching laboratory technician.     Consulted and Agree with Plan of Care  Patient;Family member/caregiver    Family Member Consulted  Niece, [name]        Patient will benefit from skilled therapeutic intervention in order to improve the following deficits and impairments:  Abnormal gait, Decreased activity tolerance, Decreased balance, Decreased coordination, Decreased endurance, Decreased knowledge of use of DME, Decreased mobility, Decreased range of motion, Decreased scar mobility, Decreased strength, Impaired flexibility, Postural dysfunction, Obesity, Prosthetic Dependency  Visit Diagnosis: Muscle weakness (generalized)  Unsteadiness on feet  Other abnormalities of gait and mobility  Abnormal posture     Problem List Patient Active Problem List   Diagnosis Date Noted  . Atherosclerosis of native arteries of the extremities with gangrene ([place]) 11/01/2017  . Unilateral AKA,  left ([place])   . Post-operative pain   . OSA (obstructive sleep apnea)   . Benign essential HTN   . Diabetes mellitus type 2 in obese ([place])   . Hyperkalemia   . Leukocytosis   . Acute blood loss anemia   . Amputation stump infection ([place]) 08/23/2017  . Amputation of left lower extremity above knee upon examination ([place]) 08/23/2017  . PAD (peripheral artery disease) ([place]) 07/23/2017  . Cellulitis 06/10/2017  . Hyponatremia 06/10/2017  . Fever 06/10/2017  . Rash 05/24/2017  . Diabetes ([place])   . HTN (hypertension)   . Allergic rhinitis   . Insomnia   . Depression     Koryn Charlot  PT, DPT 06/30/2018, 3:49 PM  [facility] [address] [place], [place], [address] Phone: [phone]   Fax:  [phone]  Name: Ameria Sanjurjo MRN: [id number] Date of Birth: 1955-10-12

## 2018-07-01 ENCOUNTER — Other Ambulatory Visit: Payer: Self-pay

## 2018-07-01 NOTE — Patient Outreach (Signed)
[facility]N) Care Management  07/01/2018  Josefine ClassJanice Mun 1956-07-17 [id number]  Incoming call from the patient on today's date, HIPAA identifiers confirmed. BSW introduced self to the patient and began to conduct a community resource consult. The patient confirmed she is interested in applying for Medicaid. The patient reports she receives disability. The patient denies ever applying for Medicaid in the past. BSW inquired what her monthly income is and the patient states "I don't know my brother in law takes care of that". The patient reports her "brother in law" Marikay AlarGreg Richardson is her financial payee as well as power of attorney. BSW inquired why the patient was on disability. The patient reported "I only have one leg. And I don't have much knowledge". When asked to elaborate the patient reports a sixth grade education citing "they [place] you [place]  BSW spoke with the patient about how financial information and documents is needed to apply for Medicaid. It is noted the patient has a planned home visit with CSW, Danford BadJoanna Saporito on 9/26. BSW will collaborate with CSW to provide the patient with a Medicaid application during the scheduled home visit.   Bevelyn NgoKendra Audreana Hancox, BSW, CDP [facility] Care Management Social Worker [phone]

## 2018-07-01 NOTE — Patient Outreach (Signed)
[facility]) Care Management  07/01/2018  Josefine ClassJanice Hedman 1955/10/18 [id number]  BSW attempted to contact the patient on today's date to conduct a community resource consult. Unfortunately, today's call was unsuccessful. BSW left the patient a HIPAA compliant voice message requesting a return call.   Plan: BSW will mail the patient an unsuccessful outreach letter. BSW will attempt the patient again within the next four business days.  Bevelyn NgoKendra Calisa Luckenbaugh, BSW, CDP [facility] Care Management Social Worker [phone]

## 2018-07-03 ENCOUNTER — Other Ambulatory Visit: Payer: Self-pay | Admitting: *Deleted

## 2018-07-03 NOTE — Patient Outreach (Signed)
[facility]) Care Management  07/03/2018  Danielle Buchanan 07-14-1956 [id number]   CSW received an incoming call from patient, in regards to the HIPAA compliant message left on voicemail for patient by CSW earlier in the day, indicating that she is wanting to reschedule the initial home visit with CSW. Patient did not indicate why she was not available at the time of CSW's arrival this morning, just that she was interested in rescheduling.  The initial home visit has been rescheduled with patient for Wednesday, July 16, 2018 at 12:00PM. [facility] CM Care Plan Problem One     Most Recent Value  Care Plan Problem One  Patient is experiencing symptoms of depression.  Role Documenting the Problem One  Clinical Social Worker  Care Plan for Problem One  Active  [facility] Long Term Goal   Patient will have a reduction in her symptoms of depression, through counseling and supportive services, within the next 45 days.  [facility]N Long Term Goal Start Date  06/27/18  [facility] CM Short Term Goal #1   Patient will meet with CSW for an initial home visit to discuss symptoms of depression so that CSW can provide counseling and supportive services, within the next week.  [facility] CM Short Term Goal #1 Start Date  06/27/18  Interventions for Short Term Goal #1  CSW was able to schedule an initial home visit with patient for Wednesday, July 16, 2018 at 12:00PM.  [facility] CM Short Term Goal #2   Patient will review literature provided to her by CSW related to "Signs and Symptoms of Depression" for her review, within the next two weeks.  [facility] CM Short Term Goal #2 Start Date  06/27/18  [facility] CM Short Term Goal #3  Patient will practice deep breathing exercises and relaxation techniques to help cope with symptoms of depression, within the next 30 days.  THN CM Short Term Goal #3 Start Date  06/27/18    Danford Bad, BSW, MSW, LCSW  Licensed Clinical Social Worker  [facility]   Mailing [place], [place], [place] [address] Physical Address-[address], [place], [place] [address] Toll Free Main # [phone] Fax # [phone] Cell # [phone]  Office # [phone] [email or web address]

## 2018-07-03 NOTE — Patient Outreach (Signed)
[facility]) Care Management  07/03/2018  Danielle Buchanan 1956-07-23 [id number]  CSW drove out to patient's home today (Thursday, July 03, 2018 at 9:00AM) to perform the initial home visit; however, patient was not available at the time of CSW's arrival.  CSW knocked on patient's door several times and attempted to contact patient by phone.  CSW left a HIPAA compliant message for patient on voicemail, requesting to reschedule the home visit, and is currently awaiting a return call.   Danford Bad, BSW, MSW, LCSW  Licensed Restaurant manager, fast food [facility]  Mailing [place] [address], [place], [place] [address] Physical Address-[address], [place], [place] [address] Toll Free Main # [phone] Fax # [phone] Cell # [phone]  Office # [phone] [email or web address]

## 2018-07-05 DIAGNOSIS — R296 Repeated falls: Secondary | ICD-10-CM | POA: Diagnosis not present

## 2018-07-05 DIAGNOSIS — Z89619 Acquired absence of unspecified leg above knee: Secondary | ICD-10-CM | POA: Diagnosis not present

## 2018-07-05 DIAGNOSIS — E1121 Type 2 diabetes mellitus with diabetic nephropathy: Secondary | ICD-10-CM | POA: Diagnosis not present

## 2018-07-05 DIAGNOSIS — S88012A Complete traumatic amputation at knee level, left lower leg, initial encounter: Secondary | ICD-10-CM | POA: Diagnosis not present

## 2018-07-05 DIAGNOSIS — Z89612 Acquired absence of left leg above knee: Secondary | ICD-10-CM | POA: Diagnosis not present

## 2018-07-05 DIAGNOSIS — T8189XA Other complications of procedures, not elsewhere classified, initial encounter: Secondary | ICD-10-CM | POA: Diagnosis not present

## 2018-07-07 ENCOUNTER — Encounter: Payer: Self-pay | Admitting: Physical Therapy

## 2018-07-07 ENCOUNTER — Ambulatory Visit: Payer: Medicare HMO | Admitting: Physical Therapy

## 2018-07-07 ENCOUNTER — Ambulatory Visit: Payer: Self-pay

## 2018-07-07 DIAGNOSIS — R2689 Other abnormalities of gait and mobility: Secondary | ICD-10-CM

## 2018-07-07 DIAGNOSIS — M25652 Stiffness of left hip, not elsewhere classified: Secondary | ICD-10-CM | POA: Diagnosis not present

## 2018-07-07 DIAGNOSIS — M6281 Muscle weakness (generalized): Secondary | ICD-10-CM

## 2018-07-07 DIAGNOSIS — R2681 Unsteadiness on feet: Secondary | ICD-10-CM

## 2018-07-07 DIAGNOSIS — R293 Abnormal posture: Secondary | ICD-10-CM

## 2018-07-07 NOTE — Therapy (Signed)
[facility] [address] [place], [place], [address] Phone: [phone]   Fax:  [phone]  Physical Therapy Treatment  Patient Details  Name: Danielle Buchanan MRN: [id number] Date of Birth: [birth year] Referring Provider (PT): Maurice Small, MD   Encounter Date: 07/07/2018  PT End of Session - 07/07/18 2010    Visit Number  25    Number of Visits  29    Date for PT Re-Evaluation  06/03/18    Authorization Type  [employer]    Authorization Time Period  $3400 OOP max has been met prior to PT evaluation. Visit limit Medicare guidelines    PT Start Time  2706    PT Stop Time  1100    PT Time Calculation (min)  45 min    Equipment Utilized During Treatment  Gait belt    Activity Tolerance  Patient tolerated treatment well;Patient limited by pain   limited by hand pain with increased pressure, rest breaks helped   Behavior During Therapy  WFL for tasks assessed/performed       Past Medical History:  Diagnosis Date  . Allergic rhinitis   . Depression   . Diabetes ([place])   . Dyslipidemia   . HTN (hypertension)   . Insomnia   . OSA (obstructive sleep apnea)    no cpap  . Urinary incontinence     Past Surgical History:  Procedure Laterality Date  . ABDOMINAL AORTOGRAM W/LOWER EXTREMITY N/A 06/24/2017   Procedure: ABDOMINAL AORTOGRAM W/LOWER EXTREMITY;  Surgeon: Waynetta Sandy, MD;  Location: [facility];  Service: Cardiovascular;  Laterality: N/A;  . ABDOMINAL AORTOGRAM W/LOWER EXTREMITY Right 10/09/2017   Procedure: ABDOMINAL AORTOGRAM W/LOWER EXTREMITY;  Surgeon: Waynetta Sandy, MD;  Location: [facility];  Service: Cardiovascular;  Laterality: Right;  . AMPUTATION Left 07/23/2017   Procedure: AMPUTATION  BELOW KNEE;  Surgeon: Waynetta Sandy, MD;  Location: [facility];  Service: Vascular;  Laterality: Left;  . AMPUTATION Left 08/12/2017   Procedure: REVISION BELOW KNEE;   Surgeon: Waynetta Sandy, MD;  Location: [facility];  Service: Vascular;  Laterality: Left;  . AMPUTATION Left 08/27/2017   Procedure: left ABOVE KNEE AMPUTATION;  Surgeon: Waynetta Sandy, MD;  Location: [facility];  Service: Vascular;  Laterality: Left;  . AMPUTATION Left 08/30/2017   Procedure: REVISION AMPUTATION ABOVE KNEE;  Surgeon: Serafina Mitchell, MD;  Location: [facility];  Service: Vascular;  Laterality: Left;  . AMPUTATION Right 11/04/2017   Procedure: AMPUTATION RIGHT FOURTH TOE;  Surgeon: Waynetta Sandy, MD;  Location: [facility];  Service: Vascular;  Laterality: Right;  . APPLICATION OF WOUND VAC Left 08/27/2017   Procedure: APPLICATION OF WOUND VAC;  Surgeon: Waynetta Sandy, MD;  Location: [facility];  Service: Vascular;  Laterality: Left;  . CARPAL TUNNEL RELEASE Right   . COLONOSCOPY    . LOWER EXTREMITY INTERVENTION Right 10/09/2017   Procedure: LOWER EXTREMITY INTERVENTION;  Surgeon: Waynetta Sandy, MD;  Location: [facility];  Service: Cardiovascular;  Laterality: Right;    There were no vitals filed for this visit.  Subjective Assessment - 07/07/18 1029    Subjective  No falls. She has walked a few steps with walker in apt without issues.     Patient is accompained by:  Family member    Pertinent History  left TFA, right 4th toe amputation, DM2, PAD, HTN, right carpal tunnel release,    Limitations  Lifting;Standing;Walking;House hold activities  Patient Stated Goals  Walk with prosthesis in home & in community    Currently in Pain?  No/denies                       [facility] Adult PT Treatment/Exercise - 07/07/18 1015      Transfers   Transfers  Sit to Stand;Stand to [employer] Transfers    Sit to Stand  5: Supervision;With upper extremity assist;With armrests;From chair/3-in-1   to rollator locking prosthesis upon arising   Stand to Sit  5: Supervision;With upper extremity assist;With armrests;To chair/3-in-1    from rollator with TFA locked   Stand Pivot Transfers  4: Min guard;Other (comment)   to/from rollator seat with TFA locked prosthesis     Ambulation/Gait   Ambulation/Gait  Yes    Ambulation/Gait Assistance  5: Supervision    Ambulation/Gait Assistance Details  tactile & verbal cues on rollator walker position & upright posture, safety with brake management    Ambulation Distance (Feet)  75 Feet   60' X 2 rollator   Assistive device  Rolling walker;Prosthesis;Rollator    Ambulation Surface  Indoor;Level    Ramp  4: Min assist   RW & TFA locked prosthesis   Curb  4: Min assist   RW & TFA locked prosthesis     Prosthetics   Prosthetic Care Comments   PT set-up appt with prosthetist for alignment changes. Found out her prosthetist is leaving company & will not be available. PT explained her rights as patient. PT instructed in adjusting ply socks and tightening suspension strap    Current prosthetic wear tolerance (days/week)   daily    Current prosthetic wear tolerance (#hours/day)   5-6 hrs 2x/day    Residual limb condition   intact with no issues.     Education Provided  Skin check;Proper Donning;Proper wear schedule/adjustment    Person(s) Educated  Patient;Caregiver(s)    Education Method  Explanation;Verbal cues;Tactile cues;Demonstration    Education Method  Verbalized understanding;Needs further instruction;Verbal cues required;Tactile cues required               PT Short Term Goals - 06/23/18 1148      PT SHORT TERM GOAL #1   Title  Patient able to donne prosthesis without assistance or verbal cues. (All STGs Target Date: 07/25/2018)    Time  1    Period  Months    Status  On-going    Target Date  07/25/18      PT SHORT TERM GOAL #2   Title  Patient tolerates daily prosthesis wear >8hrs total / day.     Time  1    Period  Months    Status  On-going    Target Date  07/25/18      PT SHORT TERM GOAL #3   Title  Patient able to retrieve object from floor with  RW support with supervision.     Time  1    Period  Months    Status  On-going    Target Date  07/25/18      PT SHORT TERM GOAL #4   Title  Patient ambulates 14' with rolling walker & locked prosthesis with supervision    Baseline       Time  1    Period  Months    Status  On-going    Target Date  07/25/18      PT SHORT TERM GOAL #5   Title  Patient negotiates ramp & curb with RW & locked prosthesis with minimal guard.     Baseline        Time  1    Period  Months    Status  On-going    Target Date  07/25/18        PT Long Term Goals - 05/30/18 5093      PT LONG TERM GOAL #1   Title  Patient & family verbalize & demonstrate understanding of proper prosthetic care & patient donnes independently to enable safe use of prosthesis.   (All LTG Target Dates: 08/27/2018)    Baseline        Time  3    Period  Months    Status  Revised    Target Date  08/27/18      PT LONG TERM GOAL #2   Title  Patient tolerates wear of Transfemoral prosthesis >75% of awake hours without skin issues or limb pain.     Baseline        Time  3    Period  Months    Status  On-going    Target Date  08/27/18      PT LONG TERM GOAL #3   Title  Standing balance with RW support: reaching 10" anteriorly, picks up object from floor, scans environment & manages clothes for toileting modified indepenent.     Baseline        Time  3    Period  Months    Status  On-going    Target Date  08/27/18      PT LONG TERM GOAL #4   Title  Patient ambulates 200' with RW & prosthesis with family supervision for limited community mobility.     Baseline        Time  3    Period  Months    Status  Revised    Target Date  08/27/18      PT LONG TERM GOAL #5   Title  Patient ambulates 17' around furniture with RW & prosthesis modified independent for household mobility.     Baseline        Time  3    Period  Months    Status  On-going    Target Date  08/27/18      PT LONG TERM GOAL #6   Title  Patient  negotiates stairs with 2 rails, ramps & curbs with RW & prosthesis with family assist for community access.     Baseline        Time  3    Period  Months    Status  Revised    Target Date  08/27/18            Plan - 07/07/18 2011    Clinical Impression Statement  Patient improved ability to use a rollator walker with prosthesis. She has lateral lean causing lateral socket gaping and balance issues. Her prosthetist is leaving the company. She is close to needing a socket revision & verbalizes understanding of her rights for choosing a company to stay with current or switch.     Rehab Potential  Good    Clinical Impairments Affecting Rehab Potential  pt unable to read / write and sister who is caregiver reports limited ability to read / write    PT Frequency  1x / week    PT Duration  12 weeks    PT Treatment/Interventions  ADLs/Self Care Home Management;DME Instruction;Gait training;Stair training;Functional  mobility training;Therapeutic activities;Therapeutic exercise;Balance training;Neuromuscular re-education;Patient/family education;Prosthetic Training;Passive range of motion;Vestibular;Canalith Repostioning    PT Next Visit Plan  prosthetic training towards STGs. Continue weekly work with rollator walker and introduce rollator on ramps & curbs.     Consulted and Agree with Plan of Care  Patient;Family member/caregiver    Family Member Consulted  Niece, [name]        Patient will benefit from skilled therapeutic intervention in order to improve the following deficits and impairments:  Abnormal gait, Decreased activity tolerance, Decreased balance, Decreased coordination, Decreased endurance, Decreased knowledge of use of DME, Decreased mobility, Decreased range of motion, Decreased scar mobility, Decreased strength, Impaired flexibility, Postural dysfunction, Obesity, Prosthetic Dependency  Visit Diagnosis: Muscle weakness (generalized)  Unsteadiness on feet  Other abnormalities of  gait and mobility  Abnormal posture  Stiffness of left hip, not elsewhere classified     Problem List Patient Active Problem List   Diagnosis Date Noted  . Atherosclerosis of native arteries of the extremities with gangrene ([place]) 11/01/2017  . Unilateral AKA, left ([place])   . Post-operative pain   . OSA (obstructive sleep apnea)   . Benign essential HTN   . Diabetes mellitus type 2 in obese ([place])   . Hyperkalemia   . Leukocytosis   . Acute blood loss anemia   . Amputation stump infection ([place]) 08/23/2017  . Amputation of left lower extremity above knee upon examination ([place]) 08/23/2017  . PAD (peripheral artery disease) ([place]) 07/23/2017  . Cellulitis 06/10/2017  . Hyponatremia 06/10/2017  . Fever 06/10/2017  . Rash 05/24/2017  . Diabetes ([place])   . HTN (hypertension)   . Allergic rhinitis   . Insomnia   . Depression     Sherrilynn Gudgel,ROBINk PT, DPT 07/07/2018, 8:15 PM  [facility] [address] [place], [place], [address] Phone: [phone]   Fax:  [phone]  Name: Danielle Buchanan MRN: [id number] Date of Birth: 19-Apr-1956

## 2018-07-14 ENCOUNTER — Ambulatory Visit: Payer: Medicare HMO | Admitting: Physical Therapy

## 2018-07-14 ENCOUNTER — Encounter: Payer: Medicare HMO | Admitting: Physical Therapy

## 2018-07-15 ENCOUNTER — Other Ambulatory Visit: Payer: Self-pay | Admitting: Family Medicine

## 2018-07-15 ENCOUNTER — Ambulatory Visit
Admission: RE | Admit: 2018-07-15 | Discharge: 2018-07-15 | Disposition: A | Discharge status: Home / Self Care | Payer: Medicare HMO | Source: Ambulatory Visit | Attending: Family Medicine | Admitting: Family Medicine

## 2018-07-15 DIAGNOSIS — N6310 Unspecified lump in the right breast, unspecified quadrant: Secondary | ICD-10-CM

## 2018-07-15 DIAGNOSIS — N6312 Unspecified lump in the right breast, upper inner quadrant: Secondary | ICD-10-CM | POA: Diagnosis not present

## 2018-07-16 ENCOUNTER — Other Ambulatory Visit: Payer: Self-pay | Admitting: *Deleted

## 2018-07-16 NOTE — Patient Outreach (Signed)
[facility]) Care Management  07/16/2018  Danielle Buchanan 16-Sep-1956 [id number]  CSW was able to meet with patient and patient's nephew-in-law, Danielle Buchanan today to perform the initial home visit.  Patient and Mr. Danielle Buchanan were sitting out on patient's patio area at the time of CSW's arrival.  Patient indicated that she is enjoying the cooler weather, spending a great deal of her time outside visiting with friends and neighbors.  Patient reports having a great relationship with several of her neighbors and that the maintenance crew at the apartment complex check in with her on a daily basis.  Patient stated that she rarely leaves her home, unless it is to go out to dinner with family or to a doctor's appointment.  Patient is currently living in a two-bedroom apartment alone, as patient's sister, who was living with patient, as well as caring for patient and providing 24 hour care and supervision, passed away in April 08, 2018.  Patient's sister was in charge of cooking, cleaning, performing all the grocery shopping, transporting patient to and from all her physician appointments, etc.  Patient indicated that she is now relying on her niece, Danielle Buchanan and Mr. Danielle Buchanan to perform all her grocery shopping, transporting her to and from all her physician , setting up her pill box, etc.  Patient is able to prepare her own meals, perform light housekeeping duties, bath herself, and take her medications appropriately.    Mr. Danielle Buchanan reported that his wife, patient's niece, Danielle Buchanan comes by every morning to make sure that patient is dressed and helps patient put on her prothesis.  Danielle Buchanan also makes sure that patient has taken her prescription medications and that she eats at least two hot meals per day.  Mr. Danielle Buchanan went on to say that between he and his wife, patient is very well cared for and that they enjoy spending time with patient.  Mr. Danielle Buchanan stated  that he and Danielle Buchanan are good about making sure that patient gets out of her home and receives socialization, of some sort.  Patient mentioned that her dog, Danielle Buchanan has end stage kidney disease and that he is not expected to live very long.  Patient admitted that she is very attached to [facility] and that she does not know what she will do for companionship when he dies.  In [facility] to [facility], patient stated, "Well, hopefully he and I will go together".  CSW asked patient a series of questions to ensure that she is not feeling homicidal and/or suicidal, which patient adamantly denied.  Patient admits to experiencing symptoms of depression, but assured CSW that she would "never take her own life".   Patient uses a motorized wheelchair, as well as a rolling walker for ambulation and mobility.  Mr. Danielle Buchanan reported that he and Danielle Buchanan are very good about making sure that patient receives exercise on a daily basis, having patient walk the length of her apartment building and back.  Patient voiced a great deal of concern regarding the pain and numbness in her left hand, having hoped that surgery would eliminate these symptoms.  Patient has been encouraged to exercise her hand as much as possible to try and increase the strength in her wrist and fingers.  CSW offered counseling and supportive services to patient as she talked about the sudden death of her sister, only 4 months ago.  CSW spoke with patient about Grief and Loss Counseling services, offered through [facility]; however, patient denied having an interest.  Patient did agree to meet with CSW for short-term counseling services, but did not wish to seek long-term therapy in the community.  CSW performed Cognitive Behavioral Therapy on patient, as well as Person-Centered Therapy.  Patient indicated that she has urinary incontinence, which posses a real problem for her because she has to call Mr. or Mrs.  Danielle Buchanan to come help clean her up, as well as change her sheets, etc.  Patient reported that she plans to speak with her Primary Care Physician, Dr. Maurice Small, in hopes that there is some type of treatment she can receive to eliminate the incontinence.  In the meantime, CSW encouraged patient to consider wearing diapers or depends, as patient believes that she would have the ability to change her own diaper when it becomes soiled.  CSW inquired as to what patient likes to do for fun or entertainment, as CSW is aware that patient spends a large portion of her day alone.  Patient indicated that she enjoys putting together puzzles, playing cards on her phone and visiting with friends, family members and neighbors.  CSW spoke with patient about Silver Sneakers, encouraging patient to consider attending a program at her local [employer] to receive socialization, as well as build strength in her upper body.  Mr. Danielle Buchanan believed this to be a great idea, agreeing to speak with patient more about the subject, hoping he can encourage her to attend.  Patient denied being interested in attending an Adult Day Program, such as [facility] or [facility]), admitting that she does not like to be around a crowd of people, nor does she enjoy structured activities.  Patient reported that she is perfectly content staying at home most of the time, believing that her apartment is her "safe haven".  Patient went on to say that she had a very rough childhood; therefore, she has very little trust in mankind and would prefer to "keep her circle small".  Patient admits to coming from a broken home, where everybody faught all the time.  Patient further reported that both her parents were "drunks" and that her mother was a "hooker".  Patient went on to say that she lived out in the woods most of the time and had to fight for food.  Patient had 7 siblings, but is now down to 3  because 4 of them have passed away.  Patient is not close to any of her siblings and has not spoken to them in years.  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  Patient only moved to [place] three years.  Patient is fearful for her health and well-being, afraid that she is going to die soon because she lost both her parents and 4 of her siblings before the age of 65.  Patient does not believe that she is in good health, but does not wish to make any necessary changes in her life.  Patient admitted that she is not afraid to die, just somewhat concerned about the "after-life".  Patient did not mention having a religious affiliation, nor does patient attend church.  However, patient reported believing in a "higher power".  Patient and CSW agreed to meet again for the next scheduled visit on Thursday, July 24, 2018 at [facility] provided patient with EMMI information pertaining specifically to "Signs and Symptoms of Depression" for her review, which CSW plans  to discuss more in-depth with patient during the home visit.  CSW will also teach patient proper use of deep breathing exercises and relaxation techniques for when she is experiencing symptoms of depression.  [facility] CM Care Buchanan Problem One     Most Recent Value  Care Buchanan Problem One  Patient is experiencing symptoms of depression.  Role Documenting the Problem One  Clinical Social Worker  Care Buchanan for Problem One  Active  [facility] Long Term Goal   Patient will have a reduction in her symptoms of depression, through counseling and supportive services, within the next 45 days.  [facility] Long Term Goal Start Date  06/27/18  [facility] CM Short Term Goal #1   Patient will meet with CSW for an initial home visit to discuss symptoms of depression so that CSW can provide counseling and supportive services, within the next week.  [facility] CM Short Term Goal #1 Start Date  06/27/18  [facility] CM Short Term Goal #1 Met Date  07/16/18  Interventions  for Short Term Goal #1  CSW was able to meet with patient today to perform the initial home visit to provide counseling and supportive services for symptoms of depression.  [facility] CM Short Term Goal #2   Patient will review literature provided to her by CSW related to "Signs and Symptoms of Depression" for her review, within the next two weeks.  [facility] CM Short Term Goal #2 Start Date  06/27/18  [facility] CM Short Term Goal #3  Patient will practice deep breathing exercises and relaxation techniques to help cope with symptoms of depression, within the next 30 days.  [facility]N CM Short Term Goal #3 Start Date  06/27/18     Nat Christen, BSW, MSW, [place]  Licensed Clinical Social Worker  [place]  [employer] [place]. [address], [place], [place] [address] Physical Address-[address], [place],  [address] Toll Free Main # [phone] Fax # [phone] Cell # [phone]  Office # [phone] [email or web address]

## 2018-07-22 ENCOUNTER — Encounter: Payer: Self-pay | Admitting: Physical Therapy

## 2018-07-22 ENCOUNTER — Ambulatory Visit: Payer: Medicare HMO | Attending: Family Medicine | Admitting: Physical Therapy

## 2018-07-22 DIAGNOSIS — R2689 Other abnormalities of gait and mobility: Secondary | ICD-10-CM | POA: Insufficient documentation

## 2018-07-22 DIAGNOSIS — M6281 Muscle weakness (generalized): Secondary | ICD-10-CM | POA: Diagnosis not present

## 2018-07-22 DIAGNOSIS — R293 Abnormal posture: Secondary | ICD-10-CM

## 2018-07-22 DIAGNOSIS — R2681 Unsteadiness on feet: Secondary | ICD-10-CM | POA: Diagnosis not present

## 2018-07-22 DIAGNOSIS — M25652 Stiffness of left hip, not elsewhere classified: Secondary | ICD-10-CM | POA: Diagnosis not present

## 2018-07-22 NOTE — Therapy (Signed)
[facility] [address] [place], [place], [address] Phone: [phone]   Fax:  [phone]  Physical Therapy Treatment  Patient Details  Name: Danielle Buchanan MRN: [id number] Date of Birth: [birth year] Referring Provider (PT): Maurice Small, MD   Encounter Date: 07/22/2018  PT End of Session - 07/22/18 1022    Visit Number  26    Number of Visits  29    Date for PT Re-Evaluation  08/27/18    Authorization Type  [employer]    Authorization Time Period  $3400 OOP max has been met prior to PT evaluation. Visit limit Medicare guidelines    PT Start Time  1017    PT Stop Time  1100    PT Time Calculation (min)  43 min    Equipment Utilized During Treatment  Gait belt    Activity Tolerance  Patient tolerated treatment well;Patient limited by pain   limited by hand pain with increased pressure, rest breaks helped   Behavior During Therapy  WFL for tasks assessed/performed       Past Medical History:  Diagnosis Date  . Allergic rhinitis   . Depression   . Diabetes ([place])   . Dyslipidemia   . HTN (hypertension)   . Insomnia   . OSA (obstructive sleep apnea)    no cpap  . Urinary incontinence     Past Surgical History:  Procedure Laterality Date  . ABDOMINAL AORTOGRAM W/LOWER EXTREMITY N/A 06/24/2017   Procedure: ABDOMINAL AORTOGRAM W/LOWER EXTREMITY;  Surgeon: Waynetta Sandy, MD;  Location: [facility];  Service: Cardiovascular;  Laterality: N/A;  . ABDOMINAL AORTOGRAM W/LOWER EXTREMITY Right 10/09/2017   Procedure: ABDOMINAL AORTOGRAM W/LOWER EXTREMITY;  Surgeon: Waynetta Sandy, MD;  Location: [facility];  Service: Cardiovascular;  Laterality: Right;  . AMPUTATION Left 07/23/2017   Procedure: AMPUTATION  BELOW KNEE;  Surgeon: Waynetta Sandy, MD;  Location: [facility];  Service: Vascular;  Laterality: Left;  . AMPUTATION Left 08/12/2017   Procedure: REVISION BELOW KNEE;   Surgeon: Waynetta Sandy, MD;  Location: [facility];  Service: Vascular;  Laterality: Left;  . AMPUTATION Left 08/27/2017   Procedure: left ABOVE KNEE AMPUTATION;  Surgeon: Waynetta Sandy, MD;  Location: [facility];  Service: Vascular;  Laterality: Left;  . AMPUTATION Left 08/30/2017   Procedure: REVISION AMPUTATION ABOVE KNEE;  Surgeon: Serafina Mitchell, MD;  Location: [facility];  Service: Vascular;  Laterality: Left;  . AMPUTATION Right 11/04/2017   Procedure: AMPUTATION RIGHT FOURTH TOE;  Surgeon: Waynetta Sandy, MD;  Location: [facility];  Service: Vascular;  Laterality: Right;  . APPLICATION OF WOUND VAC Left 08/27/2017   Procedure: APPLICATION OF WOUND VAC;  Surgeon: Waynetta Sandy, MD;  Location: [facility];  Service: Vascular;  Laterality: Left;  . CARPAL TUNNEL RELEASE Right   . COLONOSCOPY    . LOWER EXTREMITY INTERVENTION Right 10/09/2017   Procedure: LOWER EXTREMITY INTERVENTION;  Surgeon: Waynetta Sandy, MD;  Location: [facility];  Service: Cardiovascular;  Laterality: Right;    There were no vitals filed for this visit.  Subjective Assessment - 07/22/18 1020    Subjective  No falls to report. Walked to car today with nephew in [employer]. Still with right fingers.     Patient is accompained by:  Family member    Pertinent History  left TFA, right 4th toe amputation, DM2, PAD, HTN, right carpal tunnel release,    Limitations  Lifting;Standing;Walking;House hold activities    Patient Stated Goals  Walk with prosthesis in home & in community    Currently in Pain?  Yes    Pain Score  5     Pain Location  Finger (Comment which one)   4th and 5th digits   Pain Orientation  Right    Pain Descriptors / Indicators  Aching;Dull    Pain Type  Acute pain;Chronic pain    Pain Onset  1 to 4 weeks ago    Pain Frequency  Intermittent    Aggravating Factors   weight bearing on it    Pain Relieving Factors  rest          [facility] Adult PT Treatment/Exercise  - 07/22/18 1023      Transfers   Transfers  Sit to Stand;Stand to Sit    Sit to Stand  5: Supervision;With upper extremity assist;With armrests;From chair/3-in-1    Sit to Stand Details (indicate cue type and reason)  needs UE support on device for stability with standing    Stand to Sit  5: Supervision;With upper extremity assist;With armrests;To chair/3-in-1    Stand to Sit Details  needs UE support for stability with sitting down      Ambulation/Gait   Ambulation/Gait  Yes    Ambulation/Gait Assistance  5: Supervision;4: Min guard    Ambulation/Gait Assistance Details  115 feet wtih RW/prosthesis locked with supervision to min guard assist toward last 15 feet due to fatigue/unsafe use of RW.  with rollator for 65 feet x2 reps pt needed up to min guard assist/cues for rollator proximity with gait, cues for posutre and for bil step length with gait.     Ambulation Distance (Feet)  115 Feet   x1, 25 x1 +ramp/curb, 65 x2   Assistive device  Rolling walker;Prosthesis    Gait Pattern  Step-through pattern;Decreased stride length;Decreased stance time - left;Decreased step length - right    Ambulation Surface  Level;Indoor    Ramp  Other (comment)   min guard assist   Ramp Details (indicate cue type and reason)  with RW/locked prosthesis. cues on sequencing and step length    Curb  Other (comment);4: Min assist   min guard assit   Curb Details (indicate cue type and reason)  wtih RW/locked prosthesis. reminder cues on sequencing with assist for balance with lowering RW down curb, otherwise min guard assist.       Therapeutic Activites    Therapeutic Activities  Other Therapeutic Activities    Other Therapeutic Activities  with RW support/locked prosthesis: able to retrieve foam noodle from floor with supervision.       Prosthetics   Prosthetic Care Comments   saw prosthetist and had adjustements made. reports she is leaning toward staying with this company and working with one of the other  prosthetist there when [employer].     Current prosthetic wear tolerance (days/week)   daily    Current prosthetic wear tolerance (#hours/day)   5-6 hrs 2x/day    Residual limb condition   intact with no issues.     Donning Prosthesis  Modified independent (device/increased time)    Doffing Prosthesis  Modified independent (device/increased time)               PT Short Term Goals - 07/22/18 1026      PT SHORT TERM GOAL #1   Title  Patient able to donne prosthesis without assistance or verbal cues. (All STGs Target Date: 07/25/2018)  Baseline  07/22/18: met today    Status  Achieved      PT SHORT TERM GOAL #2   Title  Patient tolerates daily prosthesis wear >8hrs total / day.     Baseline  07/22/18: met today    Status  Achieved      PT SHORT TERM GOAL #3   Title  Patient able to retrieve object from floor with RW support with supervision.     Baseline  07/22/18: met today    Time  --    Period  --    Status  Achieved      PT SHORT TERM GOAL #4   Title  Patient ambulates 78' with rolling walker & locked prosthesis with supervision    Baseline  07/22/18: met today    Time  --    Period  --    Status  Achieved      PT SHORT TERM GOAL #5   Title  Patient negotiates ramp & curb with RW & locked prosthesis with minimal guard.     Baseline  07/22/18; met today with ramp, partially met with curb    Time  --    Period  --    Status  Partially Met        PT Long Term Goals - 05/30/18 5397      PT LONG TERM GOAL #1   Title  Patient & family verbalize & demonstrate understanding of proper prosthetic care & patient donnes independently to enable safe use of prosthesis.   (All LTG Target Dates: 08/27/2018)    Baseline        Time  3    Period  Months    Status  Revised    Target Date  08/27/18      PT LONG TERM GOAL #2   Title  Patient tolerates wear of Transfemoral prosthesis >75% of awake hours without skin issues or limb pain.     Baseline        Time  3     Period  Months    Status  On-going    Target Date  08/27/18      PT LONG TERM GOAL #3   Title  Standing balance with RW support: reaching 10" anteriorly, picks up object from floor, scans environment & manages clothes for toileting modified indepenent.     Baseline        Time  3    Period  Months    Status  On-going    Target Date  08/27/18      PT LONG TERM GOAL #4   Title  Patient ambulates 200' with RW & prosthesis with family supervision for limited community mobility.     Baseline        Time  3    Period  Months    Status  Revised    Target Date  08/27/18      PT LONG TERM GOAL #5   Title  Patient ambulates 4' around furniture with RW & prosthesis modified independent for household mobility.     Baseline        Time  3    Period  Months    Status  On-going    Target Date  08/27/18      PT LONG TERM GOAL #6   Title  Patient negotiates stairs with 2 rails, ramps & curbs with RW & prosthesis with family assist for community access.     Baseline  Time  3    Period  Months    Status  Revised    Target Date  08/27/18            Plan - 07/22/18 1023    Clinical Impression Statement  Today's skilled session focused on progress toward STGs with 2/3 goals met, the remaining goal was partially met as the pt needs min assist with curb negotiation vs goal of min guard assist. Remainder of session continued with use of rollator with gait on level surfaces with locked prosthesis. This was limited due to right hand/finger pain with use of rollator brake. The pt is progressing toward goals and should benefit from continued PT to progress toward unmet goals.     Rehab Potential  Good    Clinical Impairments Affecting Rehab Potential  pt unable to read / write and sister who is caregiver reports limited ability to read / write    PT Frequency  1x / week    PT Duration  12 weeks    PT Treatment/Interventions  ADLs/Self Care Home Management;DME Instruction;Gait  training;Stair training;Functional mobility training;Therapeutic activities;Therapeutic exercise;Balance training;Neuromuscular re-education;Patient/family education;Prosthetic Training;Passive range of motion;Vestibular;Canalith Repostioning    PT Next Visit Plan   Continue weekly work with rollator walker and introduce rollator on ramps & curbs progressing toward LTGs.     Consulted and Agree with Plan of Care  Patient;Family member/caregiver    Family Member Consulted  Nephew in law- Greg       Patient will benefit from skilled therapeutic intervention in order to improve the following deficits and impairments:  Abnormal gait, Decreased activity tolerance, Decreased balance, Decreased coordination, Decreased endurance, Decreased knowledge of use of DME, Decreased mobility, Decreased range of motion, Decreased scar mobility, Decreased strength, Impaired flexibility, Postural dysfunction, Obesity, Prosthetic Dependency  Visit Diagnosis: Muscle weakness (generalized)  Unsteadiness on feet  Other abnormalities of gait and mobility  Abnormal posture     Problem List Patient Active Problem List   Diagnosis Date Noted  . Atherosclerosis of native arteries of the extremities with gangrene ([place]) 11/01/2017  . Unilateral AKA, left ([place])   . Post-operative pain   . OSA (obstructive sleep apnea)   . Benign essential HTN   . Diabetes mellitus type 2 in obese ([place])   . Hyperkalemia   . Leukocytosis   . Acute blood loss anemia   . Amputation stump infection ([place]) 08/23/2017  . Amputation of left lower extremity above knee upon examination ([place]) 08/23/2017  . PAD (peripheral artery disease) ([place]) 07/23/2017  . Cellulitis 06/10/2017  . Hyponatremia 06/10/2017  . Fever 06/10/2017  . Rash 05/24/2017  . Diabetes ([place])   . HTN (hypertension)   . Allergic rhinitis   . Insomnia   . Depression     Willow Ora, PTA, [facility] [address] [place],  [place] [address] [phone] 07/22/18, 2:16 PM   Name: Danielle Buchanan MRN: [id number] Date of Birth: 01/05/1956

## 2018-07-23 DIAGNOSIS — F5101 Primary insomnia: Secondary | ICD-10-CM | POA: Diagnosis not present

## 2018-07-24 ENCOUNTER — Other Ambulatory Visit: Payer: Self-pay | Admitting: *Deleted

## 2018-07-24 NOTE — Patient Outreach (Signed)
[facility]) Care Management  07/24/2018  Danielle Buchanan 22-Feb-1956 [id number]  CSW was able to meet with patient today for a routine home visit.  CSW noted that patient went to therapy on Tuesday, July 22, 2018.  Patient continues to go to outpatient therapy once per week at [facility].  Patient does not know how much longer she will need to attend, but reported, "I guess until I am able to walk again on my own".  Patient indicated that she is currently meeting all of her goals and is pleased with the progress she is making.  Patient reported that she is diligent about performing her exercises in the home.  Patient reported that she saw her Primary Care Physician, Dr. Maurice Small on Wednesday, July 23, 2018.  Dr. Justin Mend is a Family Medicine Doctor, affiliated with the [facility]. Patient indicated that she saw Dr. Justin Mend about her urinary incontinence, being prescribed a new medication.  Patient further reported that Dr. Justin Mend increased her Trazodone from 50 MG PO at bedtime to 100 MG PO at bedtime, as patient indicated that she is having trouble falling asleep, as well as staying asleep.  Patient admits that she is tired of "tossing and turning" all night, desperately wanting a good nights sleep.  Patient indicated that if she drinks one beer before going to bed, then she is able to fall asleep and stay asleep throughout the night.  However, patient is fearful of resorting to this measure because she reported that she used to be a "severe alcoholic".  Patient admits that she has not "drank to get drunk" since she was 62 years of age.  Patient indicated that she just quit "cold [place]", never receiving any type of treatment or therapeutic services.  Patient reported that she is now trying to quit smoking.  Patient admitted that she has been smoking a pack per day since the age of 62.  Patient is down to smoking only  4-5 cigarettes per day.  Patient was prescribed Nicotine Polacrilex 2 MG gum, as needed.  Patient did not wish to talk about her childhood history today, reporting "There is really not much to talk about".  Patient then stated, "I'm trying to bury that part of my past because that would definitely keep me up at night".  Patient admitted that she was not close to her parents at all, realizing that neither of her parents wanted her, pawning her off on her older siblings.  Patient indicated that she got thrown in jail when she was 62 years old, due to drinking and driving.  Patient reported being in jail for about two weeks, with absolutely no visitors or family support.  Per patient, the only family member she was ever close to was her sister, Danielle Buchanan, now deceased.  Patient reported that her parents cut both her and Danielle Buchanan out of their Will before they died, disowning them both.  Patient indicated that she moved out of her house around the age of 65, staying in the woods or in a nearby shed.  Patient remembers eating food from other people's gardens and nearly freezing to death in the winter months.  As soon as patient was able to afford her own vehicle, she began sleeping in it, anything to avoid the wrath of her parents and older siblings.  CSW was able to review EMMI information with patient, pertaining specifically to "Signs and Symptoms of Depression", to ensure understanding.  Patient reported that she  pets her dog, Danielle Buchanan when she becomes really anxious or depressed, as this seems to soothe her.  Patient admitted that she also enjoys smoking when she experiences symptoms of depression, needing to develop a different strategy for coping.  CSW agreed to teach patient deep breathing exercises and relaxation techniques during the next scheduled home visit.  The next routine home visit is scheduled for Wednesday, July 30, 2018 at 9:00 AM.  [facility] CM Care Plan Problem One     Most Recent Value  Care Plan  Problem One  Patient is experiencing symptoms of depression.  Role Documenting the Problem One  Clinical Social Worker  Care Plan for Problem One  Active  [facility] Long Term Goal   Patient will have a reduction in her symptoms of depression, through counseling and supportive services, within the next 45 days.  [facility] Long Term Goal Start Date  06/27/18  [facility] CM Short Term Goal #1   Patient will meet with CSW for an initial home visit to discuss symptoms of depression so that CSW can provide counseling and supportive services, within the next week.  [facility] CM Short Term Goal #1 Start Date  06/27/18  [facility] CM Short Term Goal #1 Met Date  07/16/18  [facility] CM Short Term Goal #2   Patient will review literature provided to her by CSW related to "Signs and Symptoms of Depression" for her review, within the next two weeks.  [facility] CM Short Term Goal #2 Start Date  06/27/18  [facility] CM Short Term Goal #2 Met Date  07/24/18  Interventions for Short Term Goal #2  CSW was able to review literature with patient pertaining to "Signs and Symptoms of Depression" to ensure understanding.  [facility] CM Short Term Goal #3  Patient will practice deep breathing exercises and relaxation techniques to help cope with symptoms of depression, within the next 30 days.  [facility] CM Short Term Goal #3 Start Date  06/27/18     Nat Christen, BSW, MSW, [place]  Licensed Clinical Social Worker  [facility]  [employer] [place]. [address], [place], [place] [address] Physical Address-[address], [place], [place] [address] Toll Free Main # [phone] Fax # [phone] Cell # [phone]  Office # [phone] [email or web address]

## 2018-07-28 ENCOUNTER — Ambulatory Visit (INDEPENDENT_AMBULATORY_CARE_PROVIDER_SITE_OTHER)
Admission: RE | Admit: 2018-07-28 | Discharge: 2018-07-28 | Disposition: A | Discharge status: Home / Self Care | Payer: Medicare HMO | Source: Ambulatory Visit | Attending: Family | Admitting: Family

## 2018-07-28 ENCOUNTER — Encounter: Payer: Self-pay | Admitting: Family

## 2018-07-28 ENCOUNTER — Ambulatory Visit (HOSPITAL_COMMUNITY)
Admission: RE | Admit: 2018-07-28 | Discharge: 2018-07-28 | Disposition: A | Discharge status: Home / Self Care | Payer: Medicare HMO | Source: Ambulatory Visit | Attending: Family | Admitting: Family

## 2018-07-28 ENCOUNTER — Ambulatory Visit: Payer: Medicare HMO | Admitting: Family

## 2018-07-28 VITALS — BP 135/81 | HR 78 | Temp 97.2°F | Resp 16 | Ht 65.0 in | Wt 210.0 lb

## 2018-07-28 DIAGNOSIS — Z89612 Acquired absence of left leg above knee: Secondary | ICD-10-CM

## 2018-07-28 DIAGNOSIS — E1151 Type 2 diabetes mellitus with diabetic peripheral angiopathy without gangrene: Secondary | ICD-10-CM

## 2018-07-28 DIAGNOSIS — I739 Peripheral vascular disease, unspecified: Secondary | ICD-10-CM

## 2018-07-28 DIAGNOSIS — Z89421 Acquired absence of other right toe(s): Secondary | ICD-10-CM

## 2018-07-28 DIAGNOSIS — IMO0002 Reserved for concepts with insufficient information to code with codable children: Secondary | ICD-10-CM

## 2018-07-28 DIAGNOSIS — I779 Disorder of arteries and arterioles, unspecified: Secondary | ICD-10-CM

## 2018-07-28 DIAGNOSIS — Z87891 Personal history of nicotine dependence: Secondary | ICD-10-CM | POA: Diagnosis not present

## 2018-07-28 DIAGNOSIS — E1165 Type 2 diabetes mellitus with hyperglycemia: Secondary | ICD-10-CM

## 2018-07-28 NOTE — Progress Notes (Addendum)
[facility]    CC: Follow up peripheral artery occlusive disease  History of Present Illness Danielle Buchanan is a 62 y.o. female who is s/p right 4th toe amputationon 11-04-17 by [name] fornecrotic right fourth toe. Shehas undergone revascularization of her right lower extremity as well as a left-sided above-knee amputation.  Pt returned on 11-22-17 for 2-3 week postoperative visit.   She returned on 01-03-18 as an urgent referral from Dr. [name] with report of cool toes of right foot. Pt denies fever or chills, denies pain in right foot.  She walks well with her walker.   She is receiving physical therapy with her prosthesis.   Her endocrinologist is Dr. Everardo [place].    Diabetic: Yes, uncontrolled, A1C was 8.1 on 11-04-17, 10.7 on 03-11-18 Tobacco use: former smoker, quit on 07-26-18  (3-4 cigs/day x 51+ yrs) Pt sister died recently and pt resumed smoking, quit on 07-26-18.   Pt meds include: Statin :Yes Betablocker: No ASA: Yes Other anticoagulants/antiplatelets: Plavix    Past Medical History:  Diagnosis Date  . Allergic rhinitis   . Depression   . Diabetes (HCC)   . Dyslipidemia   . HTN (hypertension)   . Insomnia   . OSA (obstructive sleep apnea)    no cpap  . Urinary incontinence     Social History Social History   Tobacco Use  . Smoking status: Light Tobacco Smoker    Packs/day: 1.00    Years: 51.00    Pack years: 51.00    Types: Cigarettes    Last attempt to quit: 06/08/2017    Years since quitting: 1.1  . Smokeless tobacco: Never Used  Substance Use Topics  . Alcohol use: No    Alcohol/week: 0.0 standard drinks  . Drug use: No    Family History Family History  Problem Relation Age of Onset  . Heart attack Mother   . Alcohol abuse Mother   . Heart attack Father   . Alcohol abuse Father   . Heart attack Sister   . Heart attack Brother   . Hypercholesterolemia Sister   . Diabetes Neg Hx     Past Surgical  History:  Procedure Laterality Date  . ABDOMINAL AORTOGRAM W/LOWER EXTREMITY N/A 06/24/2017   Procedure: ABDOMINAL AORTOGRAM W/LOWER EXTREMITY;  Surgeon: Maeola Harman, MD;  Location: [facility] INVASIVE CV LAB;  Service: Cardiovascular;  Laterality: N/A;  . ABDOMINAL AORTOGRAM W/LOWER EXTREMITY Right 10/09/2017   Procedure: ABDOMINAL AORTOGRAM W/LOWER EXTREMITY;  Surgeon: Maeola Harman, MD;  Location: [facility] INVASIVE CV LAB;  Service: Cardiovascular;  Laterality: Right;  . AMPUTATION Left 07/23/2017   Procedure: AMPUTATION  BELOW KNEE;  Surgeon: Maeola Harman, MD;  Location: [facility] OR;  Service: Vascular;  Laterality: Left;  . AMPUTATION Left 08/12/2017   Procedure: REVISION BELOW KNEE;  Surgeon: Maeola Harman, MD;  Location: [facility] OR;  Service: Vascular;  Laterality: Left;  . AMPUTATION Left 08/27/2017   Procedure: left ABOVE KNEE AMPUTATION;  Surgeon: Maeola Harman, MD;  Location: [facility] OR;  Service: Vascular;  Laterality: Left;  . AMPUTATION Left 08/30/2017   Procedure: REVISION AMPUTATION ABOVE KNEE;  Surgeon: Nada Libman, MD;  Location: [facility] OR;  Service: Vascular;  Laterality: Left;  . AMPUTATION Right 11/04/2017   Procedure: AMPUTATION RIGHT FOURTH TOE;  Surgeon: Maeola Harman, MD;  Location: [facility] OR;  Service: Vascular;  Laterality: Right;  . APPLICATION OF WOUND VAC Left 08/27/2017   Procedure: APPLICATION OF WOUND VAC;  Surgeon: Maeola Harman, MD;  Location: [facility] OR;  Service: Vascular;  Laterality: Left;  . CARPAL TUNNEL RELEASE Right   . COLONOSCOPY    . LOWER EXTREMITY INTERVENTION Right 10/09/2017   Procedure: LOWER EXTREMITY INTERVENTION;  Surgeon: Maeola Harman, MD;  Location: [facility] INVASIVE CV LAB;  Service: Cardiovascular;  Laterality: Right;    Allergies  Allergen Reactions  . Milk-Related Compounds     incontinence  . Doxycycline Rash    Current Outpatient Medications  Medication Sig Dispense Refill   . amitriptyline (ELAVIL) 25 MG tablet Take 25 mg by mouth at bedtime.    Marland Kitchen aspirin (ASPIRIN 81) 81 MG chewable tablet Aspir-81    . aspirin EC 325 MG tablet Take 325 mg by mouth daily.    . clopidogrel (PLAVIX) 75 MG tablet Take 1 tablet (75 mg total) by mouth daily. 30 tablet 3  . insulin NPH-regular Human (NOVOLIN 70/30) (70-30) 100 UNIT/ML injection 120 units with breakfast, and 90 units with supper, and syringes 3/day. 210 mL 3  . losartan (COZAAR) 100 MG tablet Take 100 mg by mouth daily.    . Melatonin 10 MG TABS Take 20 mg by mouth at bedtime.    . nicotine polacrilex (NICORELIEF) 2 MG gum Take 2 mg by mouth as needed for smoking cessation.    . pravastatin (PRAVACHOL) 20 MG tablet Take 20 mg by mouth daily.     . sertraline (ZOLOFT) 100 MG tablet Take 150 mg by mouth daily.     . traZODone (DESYREL) 50 MG tablet Take 50 mg by mouth at bedtime.    Marland Kitchen VITAMIN E PO Take 180 mg by mouth daily.    . Zinc 50 MG TABS Take 50 mg by mouth daily.     No current facility-administered medications for this visit.     ROS: See HPI for pertinent positives and negatives.   Physical Examination  Vitals:   07/28/18 1114  BP: 135/81  Pulse: 78  Resp: 16  Temp: (!) 97.2 F (36.2 C)  TempSrc: Oral  SpO2: 94%  Weight: 210 lb (95.3 kg)  Height: 5\' 5"  (1.651 m)   Body mass index is 34.95 kg/m.  General: A&O x 3, WDWN, obese female. Gait: seated in her w/c HENT: No gross abnormalities.   Eyes: PERRLA. Pulmonary: Respirations are non labored, few rales in bases, fair air movement Cardiac: regular rhythm, no detected murmur.         Carotid Bruits Right Left   Negative Negative   Radial pulses are 2+ palpable bilaterally   Adominal aortic pulse is not palpable                      VASCULAR EXAM: Extremities without ischemic changes, without Gangrene; without open wounds. Right 4th toe amputation site is well healed. No signs of ischemia on right foot.  Left AKA prosthesis stump  cover wit attacher in place.  LE Pulses Right Left       FEMORAL  not palpable seated in w/c, obese  not palpable        POPLITEAL  not palpable  AKA       POSTERIOR TIBIAL  not palpable   AKA        DORSALIS PEDIS      ANTERIOR TIBIAL not palpable  AKA    Abdomen: soft, NT, no palpable masses. Large panus Skin: no rashes, no cellulitis, no ulcers noted. Musculoskeletal: no muscle wasting or atrophy See Extremities  Neurologic: A&O X 3; appropriate affect, Sensation is normal; MOTOR FUNCTION:  moving all extremities equally, motor strength 5/5 throughout. Speech is fluent/normal. CN 2-12 intact. Psychiatric: Thought content is normal, mood appropriate for clinical situation.     ASSESSMENT: Danielle Buchanan is a 62 y.o. female who is s/p right 4th toe amputationon 11-04-17 by [name] fornecrotic right fourth toe. Shehas undergone revascularization of her right lower extremity as well as a left-sided above-knee amputation.  Right 4th toe amputation site is well healed No evidence of ischemia in the right foot.  Left AKA stump is well healed, is wearing her left AKA prosthesis stump cover with prosthesis attacher.   Her atherosclerotic risk factors include uncontrolled and worsening DM, 51+ years of smoking (quit 2 days ago), obesity, and OSA.   Suspect asymptomatic Baker's cyst at right popliteal fossa, will defer to pt PCP for evaluation and management of this.    Danielle Buchanan requires prosthetic liners for suspension of her prosthesis and to maintain residual limb health. Her current liners are are in poor condition and need to be replaced. She also requires the replacement of her prosthetic socks which serve to manage daily volume fluctuations of the residual limb.    DATA   Right LE Arterial Duplex  (07-28-18): Highest velocity is 194 cm/s at the proximal SFA. Triphasic waveforms from distal CFA to mid SFA, biphasic waveforms from distal SFA to mid PTA, monophasic waveforms at distal PTA. Distal peroneal not visualized. No significant change compared to the exam on 04-25-18 other than right medial popliteal fossa irregularly shaped fluid collection displacing vessels posteriorly - 2.88 x 0.70 x 2.60.     ABI (Date: 07/28/2018):  R:   ABI: 1.02 (was 0.95 on 04-25-18),   PT: bi  DP: bi  TBI:  0.45, toe pressure 72, (was 0.78)  L: Left AKA Improved right ABI from mild disease to normal, biphasic waveforms. Decline in right TBI. Left AKA.    PLAN:  Based on the patient's vascular studies and examination, pt will return to clinic in 3 months with right LE arterial duplex and right ABI.  I advised pt and her relative with her to notify us if she develops concerns re the circulation in her feet or legs.   Pt is not under the care of a podiatrist; at pt request will refer to a podiatrist in [place].    I discussed in depth with the patient the nature of atherosclerosis, and emphasized the importance of maximal medical management including strict control of blood pressure, blood glucose, and lipid levels, obtaining regular exercise, and continued cessation of smoking.  The patient is aware that without maximal medical management the underlying atherosclerotic disease process will progress, limiting the benefit of any interventions.  The patient was given information about PAD including signs, symptoms, treatment, what symptoms should prompt the patient to seek immediate medical care, and risk reduction measures to take.  Charisse March, RN, MSN, FNP-C [facility] Phone: [phone]  Clinic MD: Myra Gianotti  07/28/18 11:50 AM

## 2018-07-28 NOTE — Patient Instructions (Signed)
Steps to Quit Smoking Smoking tobacco can be bad for your health. It can also affect almost every organ in your body. Smoking puts you and people around you at risk for many serious long-lasting (chronic) diseases. Quitting smoking is hard, but it is one of the best things that you can do for your health. It is never too late to quit. What are the benefits of quitting smoking? When you quit smoking, you lower your risk for getting serious diseases and conditions. They can include:  Lung cancer or lung disease.  Heart disease.  Stroke.  Heart attack.  Not being able to have children (infertility).  Weak bones (osteoporosis) and broken bones (fractures).  If you have coughing, wheezing, and shortness of breath, those symptoms may get better when you quit. You may also get sick less often. If you are pregnant, quitting smoking can help to lower your chances of having a baby of low birth weight. What can I do to help me quit smoking? Talk with your doctor about what can help you quit smoking. Some things you can do (strategies) include:  Quitting smoking totally, instead of slowly cutting back how much you smoke over a period of time.  Going to in-person counseling. You are more likely to quit if you go to many counseling sessions.  Using resources and support systems, such as: ? Online chats with a counselor. ? Phone quitlines. ? Printed self-help materials. ? Support groups or group counseling. ? Text messaging programs. ? Mobile phone apps or applications.  Taking medicines. Some of these medicines may have nicotine in them. If you are pregnant or breastfeeding, do not take any medicines to quit smoking unless your doctor says it is okay. Talk with your doctor about counseling or other things that can help you.  Talk with your doctor about using more than one strategy at the same time, such as taking medicines while you are also going to in-person counseling. This can help make  quitting easier. What things can I do to make it easier to quit? Quitting smoking might feel very hard at first, but there is a lot that you can do to make it easier. Take these steps:  Talk to your family and friends. Ask them to support and encourage you.  Call phone quitlines, reach out to support groups, or work with a counselor.  Ask people who smoke to not smoke around you.  Avoid places that make you want (trigger) to smoke, such as: ? Bars. ? Parties. ? Smoke-break areas at work.  Spend time with people who do not smoke.  Lower the stress in your life. Stress can make you want to smoke. Try these things to help your stress: ? Getting regular exercise. ? Deep-breathing exercises. ? Yoga. ? Meditating. ? Doing a body scan. To do this, close your eyes, focus on one area of your body at a time from head to toe, and notice which parts of your body are tense. Try to relax the muscles in those areas.  Download or buy apps on your mobile phone or tablet that can help you stick to your quit plan. There are many free apps, such as QuitGuide from the CDC (Centers for Disease Control and Prevention). You can find more support from smokefree.gov and other websites.  This information is not intended to replace advice given to you by your health care provider. Make sure you discuss any questions you have with your health care provider. Document Released: 07/21/2009 Document   Revised: 05/22/2016 Document Reviewed: 02/08/2015 Elsevier Interactive Patient Education  2018 [employer] Inc.     Peripheral Vascular Disease Peripheral vascular disease (PVD) is a disease of the blood vessels that are not part of your heart and brain. A simple term for PVD is poor circulation. In most cases, PVD narrows the blood vessels that carry blood from your heart to the rest of your body. This can result in a decreased supply of blood to your arms, legs, and internal organs, like your stomach or kidneys.  However, it most often affects a person's lower legs and feet. There are two types of PVD.  Organic PVD. This is the more common type. It is caused by damage to the structure of blood vessels.  Functional PVD. This is caused by conditions that make blood vessels contract and tighten (spasm).  Without treatment, PVD tends to get worse over time. PVD can also lead to acute ischemic limb. This is when an arm or limb suddenly has trouble getting enough blood. This is a medical emergency. Follow these instructions at home:  Take medicines only as told by your doctor.  Do not use any tobacco products, including cigarettes, chewing tobacco, or electronic cigarettes. If you need help quitting, ask your doctor.  Lose weight if you are overweight, and maintain a healthy weight as told by your doctor.  Eat a diet that is low in fat and cholesterol. If you need help, ask your doctor.  Exercise regularly. Ask your doctor for some good activities for you.  Take good care of your feet. ? Wear comfortable shoes that fit well. ? Check your feet often for any cuts or sores. Contact a doctor if:  You have cramps in your legs while walking.  You have leg pain when you are at rest.  You have coldness in a leg or foot.  Your skin changes.  You are unable to get or have an erection (erectile dysfunction).  You have cuts or sores on your feet that are not healing. Get help right away if:  Your arm or leg turns cold and blue.  Your arms or legs become red, warm, swollen, painful, or numb.  You have chest pain or trouble breathing.  You suddenly have weakness in your face, arm, or leg.  You become very confused or you cannot speak.  You suddenly have a very bad headache.  You suddenly cannot see. This information is not intended to replace advice given to you by your health care provider. Make sure you discuss any questions you have with your health care provider. Document Released:  12/19/2009 Document Revised: 03/01/2016 Document Reviewed: 03/04/2014 Elsevier Interactive Patient Education  2017 [employer] Inc.  

## 2018-07-29 ENCOUNTER — Ambulatory Visit: Payer: Medicare HMO | Admitting: Physical Therapy

## 2018-07-29 ENCOUNTER — Encounter: Payer: Self-pay | Admitting: Physical Therapy

## 2018-07-29 DIAGNOSIS — M6281 Muscle weakness (generalized): Secondary | ICD-10-CM | POA: Diagnosis not present

## 2018-07-29 DIAGNOSIS — M25652 Stiffness of left hip, not elsewhere classified: Secondary | ICD-10-CM

## 2018-07-29 DIAGNOSIS — R2681 Unsteadiness on feet: Secondary | ICD-10-CM | POA: Diagnosis not present

## 2018-07-29 DIAGNOSIS — R293 Abnormal posture: Secondary | ICD-10-CM | POA: Diagnosis not present

## 2018-07-29 DIAGNOSIS — R2689 Other abnormalities of gait and mobility: Secondary | ICD-10-CM

## 2018-07-29 NOTE — Therapy (Signed)
Danielle Buchanan [address] [place], [place], [address] Phone: [phone]   Fax:  [phone]  Physical Therapy Treatment  Patient Details  Name: Danielle Buchanan MRN: [id number] Date of Birth: Mar 27, 1956 Referring Provider (PT): Danielle Small, MD   Encounter Date: 07/29/2018  PT End of Session - 07/29/18 1109    Visit Number  27    Number of Visits  31    Date for PT Re-Evaluation  08/27/18    Authorization Type  [employer]    Authorization Time Period  $3400 OOP max has been met prior to PT evaluation. Visit limit Medicare guidelines    PT Start Time  1019    PT Stop Time  1105    PT Time Calculation (min)  46 min    Equipment Utilized During Treatment  Gait belt    Activity Tolerance  Patient tolerated treatment well;Patient limited by pain   limited by hand pain with increased pressure, rest breaks helped   Behavior During Therapy  WFL for tasks assessed/performed       Past Medical History:  Diagnosis Date  . Allergic rhinitis   . Depression   . Diabetes ([place])   . Dyslipidemia   . HTN (hypertension)   . Insomnia   . OSA (obstructive sleep apnea)    no cpap  . Urinary incontinence     Past Surgical History:  Procedure Laterality Date  . ABDOMINAL AORTOGRAM W/LOWER EXTREMITY N/A 06/24/2017   Procedure: ABDOMINAL AORTOGRAM W/LOWER EXTREMITY;  Surgeon: Danielle Sandy, MD;  Location: [facility];  Service: Cardiovascular;  Laterality: N/A;  . ABDOMINAL AORTOGRAM W/LOWER EXTREMITY Right 10/09/2017   Procedure: ABDOMINAL AORTOGRAM W/LOWER EXTREMITY;  Surgeon: Danielle Sandy, MD;  Location: [facility];  Service: Cardiovascular;  Laterality: Right;  . AMPUTATION Left 07/23/2017   Procedure: AMPUTATION  BELOW KNEE;  Surgeon: Danielle Sandy, MD;  Location: [facility];  Service: Vascular;  Laterality: Left;  . AMPUTATION Left 08/12/2017   Procedure: REVISION BELOW KNEE;   Surgeon: Danielle Sandy, MD;  Location: [facility];  Service: Vascular;  Laterality: Left;  . AMPUTATION Left 08/27/2017   Procedure: left ABOVE KNEE AMPUTATION;  Surgeon: Danielle Sandy, MD;  Location: [facility];  Service: Vascular;  Laterality: Left;  . AMPUTATION Left 08/30/2017   Procedure: REVISION AMPUTATION ABOVE KNEE;  Surgeon: Danielle Mitchell, MD;  Location: [facility];  Service: Vascular;  Laterality: Left;  . AMPUTATION Right 11/04/2017   Procedure: AMPUTATION RIGHT FOURTH TOE;  Surgeon: Danielle Sandy, MD;  Location: [facility];  Service: Vascular;  Laterality: Right;  . APPLICATION OF WOUND VAC Left 08/27/2017   Procedure: APPLICATION OF WOUND VAC;  Surgeon: Danielle Sandy, MD;  Location: [facility];  Service: Vascular;  Laterality: Left;  . CARPAL TUNNEL RELEASE Right   . COLONOSCOPY    . LOWER EXTREMITY INTERVENTION Right 10/09/2017   Procedure: LOWER EXTREMITY INTERVENTION;  Surgeon: Danielle Sandy, MD;  Location: [facility];  Service: Cardiovascular;  Laterality: Right;    There were no vitals filed for this visit.  Subjective Assessment - 07/29/18 1032    Subjective  No falls. She has been wearing prosthesis daily and walking in home some with walker alone.     Patient is accompained by:  Family member    Pertinent History  left TFA, right 4th toe amputation, DM2, PAD, HTN, right carpal tunnel release,    Limitations  Lifting;Standing;Walking;House hold activities    Patient Stated Goals  Walk with prosthesis in home & in community    Pain Score  6     Pain Location  Finger (Comment which one)    Pain Orientation  Right    Pain Descriptors / Indicators  Tingling    Pain Type  Neuropathic pain;Chronic pain    Pain Onset  More than a month ago    Pain Frequency  Constant    Aggravating Factors   weightbearing on walker    Pain Relieving Factors  rest      Prosthetic Training: Pt reports wearing prosthesis daily most of awake  hours. PT reviewed again need to tighten suspension strap in standing with weight on prosthesis.  PT reviewed socket revision & pt rights to choose company at this time. She reports she is staying with [employer] and will call to see who new prosthetist is since hers left company. PT recommended getting second pair of shoes with leather toe cap.  PT demo & instructed how to load std RW in/out car if in driver's seat. Greg plans to take her out driving in low volume setting. PT instructed in using D-rings or shower hooks and backpack to carry items with RW. PT recommended using grocery store with order ahead and bring to car And bagging frozen & cold things together. Pt & nephew verbalized understanding. Pt ambulated 106' with RW & prosthesis with supervision. Stand-pivot transfer with RW modified independent even with prosthesis not locked for 2 steps.                            PT Short Term Goals - 07/22/18 1026      PT SHORT TERM GOAL #1   Title  Patient able to donne prosthesis without assistance or verbal cues. (All STGs Target Date: 07/25/2018)    Baseline  07/22/18: met today    Status  Achieved      PT SHORT TERM GOAL #2   Title  Patient tolerates daily prosthesis wear >8hrs total / day.     Baseline  07/22/18: met today    Status  Achieved      PT SHORT TERM GOAL #3   Title  Patient able to retrieve object from floor with RW support with supervision.     Baseline  07/22/18: met today    Time  --    Period  --    Status  Achieved      PT SHORT TERM GOAL #4   Title  Patient ambulates 27' with rolling walker & locked prosthesis with supervision    Baseline  07/22/18: met today    Time  --    Period  --    Status  Achieved      PT SHORT TERM GOAL #5   Title  Patient negotiates ramp & curb with RW & locked prosthesis with minimal guard.     Baseline  07/22/18; met today with ramp, partially met with curb    Time  --    Period  --    Status  Partially  Met        PT Long Term Goals - 05/30/18 8242      PT LONG TERM GOAL #1   Title  Patient & family verbalize & demonstrate understanding of proper prosthetic care & patient donnes independently to enable safe use of prosthesis.   (All LTG Target Dates: 08/27/2018)  Baseline        Time  3    Period  Months    Status  Revised    Target Date  08/27/18      PT LONG TERM GOAL #2   Title  Patient tolerates wear of Transfemoral prosthesis >75% of awake hours without skin issues or limb pain.     Baseline        Time  3    Period  Months    Status  On-going    Target Date  08/27/18      PT LONG TERM GOAL #3   Title  Standing balance with RW support: reaching 10" anteriorly, picks up object from floor, scans environment & manages clothes for toileting modified indepenent.     Baseline        Time  3    Period  Months    Status  On-going    Target Date  08/27/18      PT LONG TERM GOAL #4   Title  Patient ambulates 200' with RW & prosthesis with family supervision for limited community mobility.     Baseline        Time  3    Period  Months    Status  Revised    Target Date  08/27/18      PT LONG TERM GOAL #5   Title  Patient ambulates 30' around furniture with RW & prosthesis modified independent for household mobility.     Baseline        Time  3    Period  Months    Status  On-going    Target Date  08/27/18      PT LONG TERM GOAL #6   Title  Patient negotiates stairs with 2 rails, ramps & curbs with RW & prosthesis with family assist for community access.     Baseline        Time  3    Period  Months    Status  Revised    Target Date  08/27/18            Plan - 07/29/18 1705    Clinical Impression Statement  Patient improved distance she can ambulate with walker & prosthesis. PT is concerned with rollator excerbating hand/RUE pain with constant application of brakes. She reports ambulating in apt alone & nephew-n-law reports able to enter/exit 3 restaurants  with their supervision. Patient wants to return to driving and community outings.     Rehab Potential  Good    Clinical Impairments Affecting Rehab Potential  pt unable to read / write and sister who is caregiver reports limited ability to read / write    PT Frequency  1x / week    PT Duration  12 weeks    PT Treatment/Interventions  ADLs/Self Care Home Management;DME Instruction;Gait training;Stair training;Functional mobility training;Therapeutic activities;Therapeutic exercise;Balance training;Neuromuscular re-education;Patient/family education;Prosthetic Training;Passive range of motion;Vestibular;Canalith Repostioning    PT Next Visit Plan  Continue weekly work with walker and ramps & curbs progressing toward LTGs.     Consulted and Agree with Plan of Care  Patient;Family member/caregiver    Family Member Consulted  Nephew in law- Greg       Patient will benefit from skilled therapeutic intervention in order to improve the following deficits and impairments:  Abnormal gait, Decreased activity tolerance, Decreased balance, Decreased coordination, Decreased endurance, Decreased knowledge of use of DME, Decreased mobility, Decreased range of motion, Decreased scar mobility, Decreased strength,  Impaired flexibility, Postural dysfunction, Obesity, Prosthetic Dependency  Visit Diagnosis: Muscle weakness (generalized)  Unsteadiness on feet  Other abnormalities of gait and mobility  Abnormal posture  Stiffness of left hip, not elsewhere classified     Problem List Patient Active Problem List   Diagnosis Date Noted  . Atherosclerosis of native arteries of the extremities with gangrene ([place]) 11/01/2017  . Unilateral AKA, left ([place])   . Post-operative pain   . OSA (obstructive sleep apnea)   . Benign essential HTN   . Diabetes mellitus type 2 in obese ([place])   . Hyperkalemia   . Leukocytosis   . Acute blood loss anemia   . Amputation stump infection ([place]) 08/23/2017  . Amputation of  left lower extremity above knee upon examination ([place]) 08/23/2017  . PAD (peripheral artery disease) ([place]) 07/23/2017  . Cellulitis 06/10/2017  . Hyponatremia 06/10/2017  . Fever 06/10/2017  . Rash 05/24/2017  . Diabetes ([place])   . HTN (hypertension)   . Allergic rhinitis   . Insomnia   . Depression     Sherline Eberwein PT, DPT 07/29/2018, 5:10 PM  [facility] [address] [place], [place], [address] Phone: [phone]   Fax:  [phone]  Name: Danielle Buchanan MRN: [id number] Date of Birth: 29-Jun-1956

## 2018-07-30 ENCOUNTER — Other Ambulatory Visit: Payer: Self-pay | Admitting: *Deleted

## 2018-07-30 NOTE — Patient Outreach (Signed)
[facility]) Care Management  07/30/2018  Danielle Buchanan 09/04/1956 [id number]   CSW drove out to patient's home today to try and perform a routine home visit; however, patient was not available at the time of CSW's arrival.  CSW then tried calling patient on the phone, but received patient's voicemail.  CSW was able to leave a HIPAA compliant message for patient on voicemail.  CSW is currently awaiting a return call.  CSW will make a second outreach attempt within the next 3-4 business days, if a return call is not received from patient in the meantime. Danford Bad, BSW, MSW, LCSW  Licensed Restaurant manager, [employer] [facility]  [place] [place] [address] [place], [place] [address] Physical Address-[address], [place], [place] [address] Toll Free Main # [phone] Fax # [phone] Cell # [phone]  Office # [phone] [email or web address]

## 2018-08-04 ENCOUNTER — Ambulatory Visit: Payer: Medicare HMO | Admitting: Physical Therapy

## 2018-08-04 ENCOUNTER — Encounter: Payer: Self-pay | Admitting: Physical Therapy

## 2018-08-04 DIAGNOSIS — M6281 Muscle weakness (generalized): Secondary | ICD-10-CM

## 2018-08-04 DIAGNOSIS — Z89619 Acquired absence of unspecified leg above knee: Secondary | ICD-10-CM | POA: Diagnosis not present

## 2018-08-04 DIAGNOSIS — R2689 Other abnormalities of gait and mobility: Secondary | ICD-10-CM

## 2018-08-04 DIAGNOSIS — M25652 Stiffness of left hip, not elsewhere classified: Secondary | ICD-10-CM | POA: Diagnosis not present

## 2018-08-04 DIAGNOSIS — R296 Repeated falls: Secondary | ICD-10-CM | POA: Diagnosis not present

## 2018-08-04 DIAGNOSIS — Z89612 Acquired absence of left leg above knee: Secondary | ICD-10-CM | POA: Diagnosis not present

## 2018-08-04 DIAGNOSIS — R2681 Unsteadiness on feet: Secondary | ICD-10-CM | POA: Diagnosis not present

## 2018-08-04 DIAGNOSIS — T8189XA Other complications of procedures, not elsewhere classified, initial encounter: Secondary | ICD-10-CM | POA: Diagnosis not present

## 2018-08-04 DIAGNOSIS — R293 Abnormal posture: Secondary | ICD-10-CM | POA: Diagnosis not present

## 2018-08-04 DIAGNOSIS — E1121 Type 2 diabetes mellitus with diabetic nephropathy: Secondary | ICD-10-CM | POA: Diagnosis not present

## 2018-08-04 DIAGNOSIS — S88012A Complete traumatic amputation at knee level, left lower leg, initial encounter: Secondary | ICD-10-CM | POA: Diagnosis not present

## 2018-08-04 NOTE — Therapy (Signed)
[facility] [address] [place], [place], [address] Phone: [phone]   Fax:  [phone]  Physical Therapy Treatment  Patient Details  Name: Danielle Buchanan MRN: [id number] Date of Birth: 01/19/1956 Referring Provider (PT): Maurice Small, MD   Encounter Date: 08/04/2018  PT End of Session - 08/04/18 1203    Visit Number  28    Number of Visits  31    Date for PT Re-Evaluation  08/27/18    Authorization Type  [employer]    Authorization Time Period  $3400 OOP max has been met prior to PT evaluation. Visit limit Medicare guidelines    PT Start Time  7412    PT Stop Time  1100    PT Time Calculation (min)  45 min    Equipment Utilized During Treatment  Gait belt    Activity Tolerance  Patient tolerated treatment well;Patient limited by pain   limited by hand pain with increased pressure, rest breaks helped   Behavior During Therapy  WFL for tasks assessed/performed       Past Medical History:  Diagnosis Date  . Allergic rhinitis   . Depression   . Diabetes ([place])   . Dyslipidemia   . HTN (hypertension)   . Insomnia   . OSA (obstructive sleep apnea)    no cpap  . Urinary incontinence     Past Surgical History:  Procedure Laterality Date  . ABDOMINAL AORTOGRAM W/LOWER EXTREMITY N/A 06/24/2017   Procedure: ABDOMINAL AORTOGRAM W/LOWER EXTREMITY;  Surgeon: Waynetta Sandy, MD;  Location: [facility];  Service: Cardiovascular;  Laterality: N/A;  . ABDOMINAL AORTOGRAM W/LOWER EXTREMITY Right 10/09/2017   Procedure: ABDOMINAL AORTOGRAM W/LOWER EXTREMITY;  Surgeon: Waynetta Sandy, MD;  Location: [facility];  Service: Cardiovascular;  Laterality: Right;  . AMPUTATION Left 07/23/2017   Procedure: AMPUTATION  BELOW KNEE;  Surgeon: Waynetta Sandy, MD;  Location: [facility];  Service: Vascular;  Laterality: Left;  . AMPUTATION Left 08/12/2017   Procedure: REVISION BELOW KNEE;   Surgeon: Waynetta Sandy, MD;  Location: [facility];  Service: Vascular;  Laterality: Left;  . AMPUTATION Left 08/27/2017   Procedure: left ABOVE KNEE AMPUTATION;  Surgeon: Waynetta Sandy, MD;  Location: [facility];  Service: Vascular;  Laterality: Left;  . AMPUTATION Left 08/30/2017   Procedure: REVISION AMPUTATION ABOVE KNEE;  Surgeon: Serafina Mitchell, MD;  Location: [facility];  Service: Vascular;  Laterality: Left;  . AMPUTATION Right 11/04/2017   Procedure: AMPUTATION RIGHT FOURTH TOE;  Surgeon: Waynetta Sandy, MD;  Location: [facility];  Service: Vascular;  Laterality: Right;  . APPLICATION OF WOUND VAC Left 08/27/2017   Procedure: APPLICATION OF WOUND VAC;  Surgeon: Waynetta Sandy, MD;  Location: [facility];  Service: Vascular;  Laterality: Left;  . CARPAL TUNNEL RELEASE Right   . COLONOSCOPY    . LOWER EXTREMITY INTERVENTION Right 10/09/2017   Procedure: LOWER EXTREMITY INTERVENTION;  Surgeon: Waynetta Sandy, MD;  Location: [facility];  Service: Cardiovascular;  Laterality: Right;    There were no vitals filed for this visit.  Subjective Assessment - 08/04/18 1021    Subjective  She fell transferring w/c to recliner without prosthesis. She denies injuries. Right foot swollen prior to fall.     Patient is accompained by:  Family member    Pertinent History  left TFA, right 4th toe amputation, DM2, PAD, HTN, right carpal tunnel release,    Limitations  Lifting;Standing;Walking;House hold activities    Patient Stated Goals  Walk with prosthesis in home & in community    Currently in Pain?  Yes    Pain Score  6     Pain Location  Finger (Comment which one)   4th & 5th fingers   Pain Orientation  Right    Pain Descriptors / Indicators  Pins and needles    Pain Type  Chronic pain;Neuropathic pain    Pain Onset  More than a month ago    Pain Frequency  Constant    Aggravating Factors   weightbearing on walker    Pain Relieving Factors  rest                        [facility] Adult PT Treatment/Exercise - 08/04/18 1015      Transfers   Transfers  Sit to Stand;Stand to Sit    Sit to Stand  5: Supervision;With upper extremity assist;With armrests;From chair/3-in-1   to RW   Stand to Sit  5: Supervision;With upper extremity assist;With armrests;To chair/3-in-1   from RW     Ambulation/Gait   Ambulation/Gait  Yes    Ambulation/Gait Assistance  5: Supervision    Ambulation/Gait Assistance Details  cues on upright posture & weight shift over prosthesis in stance    Ambulation Distance (Feet)  125 Feet   125' & 75'   Assistive device  Rolling walker;Prosthesis    Gait Pattern  Step-through pattern;Decreased stride length;Decreased stance time - left;Decreased step length - right    Ambulation Surface  Indoor;Level      Prosthetics   Prosthetic Care Comments   PT reveiwed donning with cues on controlling rotation after pt reported niece /nephew-law sometimes have to come over to help correct prosthesis.  PT also instructed in fall risk when prosthesis is off & recommended wearing from morning bath to close to bed time.    Current prosthetic wear tolerance (days/week)   daily    Current prosthetic wear tolerance (#hours/day)   5-6 hrs PT recommended increasing to most of awake hours.     Residual limb condition   intact with no issues.     Education Provided  Proper wear schedule/adjustment;Proper Donning    Person(s) Educated  Patient;Caregiver(s)    Education Method  Explanation;Demonstration;Tactile cues;Verbal cues    Education Method  Verbalized understanding;Returned demonstration;Tactile cues required;Verbal cues required             PT Education - 08/04/18 1030    Education Details  contrast bath for RUE pain management. Upper body posture to correct head forward & rounded shoulders     Person(s) Educated  Patient    Methods  Explanation;Verbal cues;Handout    Comprehension  Verbalized understanding        PT Short Term Goals - 07/22/18 1026      PT SHORT TERM GOAL #1   Title  Patient able to donne prosthesis without assistance or verbal cues. (All STGs Target Date: 07/25/2018)    Baseline  07/22/18: met today    Status  Achieved      PT SHORT TERM GOAL #2   Title  Patient tolerates daily prosthesis wear >8hrs total / day.     Baseline  07/22/18: met today    Status  Achieved      PT SHORT TERM GOAL #3   Title  Patient able to retrieve object from floor with RW support with supervision.  Baseline  07/22/18: met today    Time  --    Period  --    Status  Achieved      PT SHORT TERM GOAL #4   Title  Patient ambulates 77' with rolling walker & locked prosthesis with supervision    Baseline  07/22/18: met today    Time  --    Period  --    Status  Achieved      PT SHORT TERM GOAL #5   Title  Patient negotiates ramp & curb with RW & locked prosthesis with minimal guard.     Baseline  07/22/18; met today with ramp, partially met with curb    Time  --    Period  --    Status  Partially Met        PT Long Term Goals - 05/30/18 6333      PT LONG TERM GOAL #1   Title  Patient & family verbalize & demonstrate understanding of proper prosthetic care & patient donnes independently to enable safe use of prosthesis.   (All LTG Target Dates: 08/27/2018)    Baseline        Time  3    Period  Months    Status  Revised    Target Date  08/27/18      PT LONG TERM GOAL #2   Title  Patient tolerates wear of Transfemoral prosthesis >75% of awake hours without skin issues or limb pain.     Baseline        Time  3    Period  Months    Status  On-going    Target Date  08/27/18      PT LONG TERM GOAL #3   Title  Standing balance with RW support: reaching 10" anteriorly, picks up object from floor, scans environment & manages clothes for toileting modified indepenent.     Baseline        Time  3    Period  Months    Status  On-going    Target Date  08/27/18      PT LONG TERM  GOAL #4   Title  Patient ambulates 200' with RW & prosthesis with family supervision for limited community mobility.     Baseline        Time  3    Period  Months    Status  Revised    Target Date  08/27/18      PT LONG TERM GOAL #5   Title  Patient ambulates 54' around furniture with RW & prosthesis modified independent for household mobility.     Baseline        Time  3    Period  Months    Status  On-going    Target Date  08/27/18      PT LONG TERM GOAL #6   Title  Patient negotiates stairs with 2 rails, ramps & curbs with RW & prosthesis with family assist for community access.     Baseline        Time  3    Period  Months    Status  Revised    Target Date  08/27/18            Plan - 08/04/18 1203    Clinical Impression Statement  Patient appears to have better understanding how to control prosthetic rotation with donning. Pt also verbalizes understanding to increase wear time as fall risk & all recent falls  when prosthesis off.     Rehab Potential  Good    Clinical Impairments Affecting Rehab Potential  pt unable to read / write and sister who is caregiver reports limited ability to read / write    PT Frequency  1x / week    PT Duration  12 weeks    PT Treatment/Interventions  ADLs/Self Care Home Management;DME Instruction;Gait training;Stair training;Functional mobility training;Therapeutic activities;Therapeutic exercise;Balance training;Neuromuscular re-education;Patient/family education;Prosthetic Training;Passive range of motion;Vestibular;Canalith Repostioning    PT Next Visit Plan  Continue weekly work with walker and ramps & curbs progressing toward LTGs.     Consulted and Agree with Plan of Care  Patient;Family member/caregiver    Family Member Consulted  Nephew in law- [facility]       Patient will benefit from skilled therapeutic intervention in order to improve the following deficits and impairments:  Abnormal gait, Decreased activity tolerance, Decreased  balance, Decreased coordination, Decreased endurance, Decreased knowledge of use of DME, Decreased mobility, Decreased range of motion, Decreased scar mobility, Decreased strength, Impaired flexibility, Postural dysfunction, Obesity, Prosthetic Dependency  Visit Diagnosis: Muscle weakness (generalized)  Unsteadiness on feet  Other abnormalities of gait and mobility  Abnormal posture     Problem List Patient Active Problem List   Diagnosis Date Noted  . Atherosclerosis of native arteries of the extremities with gangrene ([place]) 11/01/2017  . Unilateral AKA, left ([place])   . Post-operative pain   . OSA (obstructive sleep apnea)   . Benign essential HTN   . Diabetes mellitus type 2 in obese ([place])   . Hyperkalemia   . Leukocytosis   . Acute blood loss anemia   . Amputation stump infection ([place]) 08/23/2017  . Amputation of left lower extremity above knee upon examination ([place]) 08/23/2017  . PAD (peripheral artery disease) ([place]) 07/23/2017  . Cellulitis 06/10/2017  . Hyponatremia 06/10/2017  . Fever 06/10/2017  . Rash 05/24/2017  . Diabetes ([place])   . HTN (hypertension)   . Allergic rhinitis   . Insomnia   . Depression     Coni Homesley PT, DPT 08/04/2018, 12:12 PM  [facility] [address] [place], [place], [address] Phone: [phone]   Fax:  [phone]  Name: Danielle Buchanan MRN: [id number] Date of Birth: 1955/10/16

## 2018-08-04 NOTE — Patient Instructions (Signed)
Contrast Bath  Prepare the baths.  Cold = 55-65 degrees     Hot = 105-110 degrees  Starting with the hot, dip hand or foot all the way into the water and hold there for selected duration.  Preferably 3 minutes.  After selected duration is up, dip hand or foot into the cold for 1/3 duration of the hot. (3 minutes hot, 1 minute cold)  Alternate back and forth for the times indicated for no more than a total of 20 minutes ending with hot.   

## 2018-08-05 ENCOUNTER — Encounter: Payer: Self-pay | Admitting: *Deleted

## 2018-08-05 ENCOUNTER — Other Ambulatory Visit: Payer: Self-pay | Admitting: *Deleted

## 2018-08-05 NOTE — Patient Outreach (Signed)
[facility]) Care Management  08/05/2018  Danielle Buchanan October 18, 1955 [id number]   CSW made a second attempt to try and contact patient today to follow-up regarding social work services and resources, as well as to try and reschedule the routine home visit, without success.  A HIPAA compliant message was left for patient on voicemail.  CSW continues to await a return call.  CSW will mail an outreach letter to patient's home, encouraging patient to contact CSW at her earliest convenience if she is interested in continuing to receive social work services through CSW with [facility].  CSW will make a third and final outreach attempt within the next 3-4 business days, if a return call is not received from patient in the meantime.  CSW will then proceed with case closure if a return call is not received from patient with a total of 10 business days, as required number of phone attempts will have been made and outreach letter mailed.  Danford Bad, BSW, MSW, LCSW  Licensed Restaurant manager, [employer] [facility]  Mailing [facility] [address], [place], [place] [address] Physical Address-[address], [place], [place] [address] Toll Free Main # [phone] Fax # [phone] Cell # [phone]  Office # [phone] [email or web address]

## 2018-08-11 ENCOUNTER — Ambulatory Visit: Payer: Medicare HMO | Attending: Family Medicine | Admitting: Physical Therapy

## 2018-08-11 ENCOUNTER — Other Ambulatory Visit: Payer: Self-pay | Admitting: *Deleted

## 2018-08-11 ENCOUNTER — Encounter: Payer: Self-pay | Admitting: Physical Therapy

## 2018-08-11 DIAGNOSIS — M79641 Pain in right hand: Secondary | ICD-10-CM | POA: Diagnosis not present

## 2018-08-11 DIAGNOSIS — R2681 Unsteadiness on feet: Secondary | ICD-10-CM | POA: Diagnosis not present

## 2018-08-11 DIAGNOSIS — M6281 Muscle weakness (generalized): Secondary | ICD-10-CM

## 2018-08-11 DIAGNOSIS — M25652 Stiffness of left hip, not elsewhere classified: Secondary | ICD-10-CM | POA: Insufficient documentation

## 2018-08-11 DIAGNOSIS — R2689 Other abnormalities of gait and mobility: Secondary | ICD-10-CM | POA: Diagnosis not present

## 2018-08-11 DIAGNOSIS — R293 Abnormal posture: Secondary | ICD-10-CM | POA: Diagnosis not present

## 2018-08-11 NOTE — Patient Outreach (Signed)
[facility]) Care Management  08/11/2018  TANEA MOGA 14-Jan-1956 [id number]    CSW made a third and final attempt to try and contact patient today to perform phone assessment, as well as assess and assist with social work needs and services, without success.  A HIPAA compliant message was left for patient on voicemail.  CSW is currently awaiting a return call.  CSW will proceed with case closure in two business days, if a return call is not received in the meantime, as required number of phone attempts have been made and an outreach letter was mailed to patient's home allowing 10 business days for a response. Danford Bad, BSW, MSW, LCSW  Licensed Restaurant manager, [employer] [facility]  Mailing [place] [address] [place], [place] [address] Physical Address-[address], [place], [place] [address] Toll Free Main # [phone] Fax # [phone] Cell # [phone]  Office # [phone] [email or web address]

## 2018-08-11 NOTE — Therapy (Signed)
[facility] [address] [place], [place], [address] Phone: [phone]   Fax:  [phone]  Physical Therapy Treatment  Patient Details  Name: Danielle Buchanan MRN: [id number] Date of Birth: 1956/01/12 Referring Provider (PT): Maurice Small, MD   Encounter Date: 08/11/2018  PT End of Session - 08/11/18 1027    Visit Number  29    Number of Visits  31    Date for PT Re-Evaluation  08/27/18    Authorization Type  [employer]    Authorization Time Period  $3400 OOP max has been met prior to PT evaluation. Visit limit Medicare guidelines    PT Start Time  8413   pt late for appt today   PT Stop Time  1100    PT Time Calculation (min)  37 min    Equipment Utilized During Treatment  Gait belt    Activity Tolerance  Patient tolerated treatment well;Patient limited by pain   limited by hand pain with increased pressure, rest breaks helped   Behavior During Therapy  WFL for tasks assessed/performed       Past Medical History:  Diagnosis Date  . Allergic rhinitis   . Depression   . Diabetes ([place])   . Dyslipidemia   . HTN (hypertension)   . Insomnia   . OSA (obstructive sleep apnea)    no cpap  . Urinary incontinence     Past Surgical History:  Procedure Laterality Date  . ABDOMINAL AORTOGRAM W/LOWER EXTREMITY N/A 06/24/2017   Procedure: ABDOMINAL AORTOGRAM W/LOWER EXTREMITY;  Surgeon: Waynetta Sandy, MD;  Location: [facility];  Service: Cardiovascular;  Laterality: N/A;  . ABDOMINAL AORTOGRAM W/LOWER EXTREMITY Right 10/09/2017   Procedure: ABDOMINAL AORTOGRAM W/LOWER EXTREMITY;  Surgeon: Waynetta Sandy, MD;  Location: [facility];  Service: Cardiovascular;  Laterality: Right;  . AMPUTATION Left 07/23/2017   Procedure: AMPUTATION  BELOW KNEE;  Surgeon: Waynetta Sandy, MD;  Location: [facility];  Service: Vascular;  Laterality: Left;  . AMPUTATION Left 08/12/2017   Procedure:  REVISION BELOW KNEE;  Surgeon: Waynetta Sandy, MD;  Location: [facility];  Service: Vascular;  Laterality: Left;  . AMPUTATION Left 08/27/2017   Procedure: left ABOVE KNEE AMPUTATION;  Surgeon: Waynetta Sandy, MD;  Location: [facility];  Service: Vascular;  Laterality: Left;  . AMPUTATION Left 08/30/2017   Procedure: REVISION AMPUTATION ABOVE KNEE;  Surgeon: Serafina Mitchell, MD;  Location: [facility];  Service: Vascular;  Laterality: Left;  . AMPUTATION Right 11/04/2017   Procedure: AMPUTATION RIGHT FOURTH TOE;  Surgeon: Waynetta Sandy, MD;  Location: [facility];  Service: Vascular;  Laterality: Right;  . APPLICATION OF WOUND VAC Left 08/27/2017   Procedure: APPLICATION OF WOUND VAC;  Surgeon: Waynetta Sandy, MD;  Location: [facility];  Service: Vascular;  Laterality: Left;  . CARPAL TUNNEL RELEASE Right   . COLONOSCOPY    . LOWER EXTREMITY INTERVENTION Right 10/09/2017   Procedure: LOWER EXTREMITY INTERVENTION;  Surgeon: Waynetta Sandy, MD;  Location: [facility];  Service: Cardiovascular;  Laterality: Right;    There were no vitals filed for this visit.  Subjective Assessment - 08/11/18 1025    Subjective  No new falls. No new complaints. Still with pain in little finger of right hand, reports other fingers are "waking up".     Patient is accompained by:  Family member    Pertinent History  left TFA, right 4th toe amputation, DM2,  PAD, HTN, right carpal tunnel release,    Limitations  Lifting;Standing;Walking;House hold activities    Patient Stated Goals  Walk with prosthesis in home & in community    Currently in Pain?  Yes    Pain Score  4     Pain Location  Finger (Comment which one)   4 and 5 fingers, mostly the 5th   Pain Orientation  Right    Pain Descriptors / Indicators  Pins and needles    Pain Type  Chronic pain;Neuropathic pain    Pain Onset  More than a month ago    Pain Frequency  Constant    Aggravating Factors   weightbearing on  walker    Pain Relieving Factors  rest         [facility] Adult PT Treatment/Exercise - 08/11/18 1028      Transfers   Transfers  Sit to Stand;Stand to Sit    Sit to Stand  5: Supervision;With upper extremity assist;With armrests;From chair/3-in-1    Sit to Stand Details (indicate cue type and reason)  needs UE support for stability with standing    Stand to Sit  5: Supervision;With upper extremity assist;With armrests;To chair/3-in-1    Stand to Sit Details  needs UE support for stability with sitting down.       Ambulation/Gait   Ambulation/Gait  Yes    Ambulation/Gait Assistance  5: Supervision    Ambulation/Gait Assistance Details  verbal cues for posture, walker position with gait and weight shifting over prosthesis     Ambulation Distance (Feet)  125 Feet   x 2, plus with barriers, 90 x1   Assistive device  Rolling walker;Prosthesis    Gait Pattern  Step-through pattern;Decreased stride length;Decreased stance time - left;Decreased step length - right    Ambulation Surface  Level;Indoor    Ramp  Other (comment)   min guard assist   Ramp Details (indicate cue type and reason)  with RW/locked prosthesis: cues on sequencing, RW position and posture.     Curb  Other (comment);4: Min assist   min guard assist   Curb Details (indicate cue type and reason)  with RW/locked prosthesis: min assist to descend safely, min guard assist to ascend. cues on correct sequencing and stance position for RW advancement.       Prosthetics   Prosthetic Care Comments   Saw another prosthetist at [facility] who stated she did not need a socket revision. He cut down the top of current socket and advised pt not to wear socks with it so she sinks all the way in. Reviewed with pt and niece the signs that socks would be needed.     Current prosthetic wear tolerance (days/week)   daily    Current prosthetic wear tolerance (#hours/day)   5-6 hrs PT recommended increasing to most of awake hours.     Residual limb  condition   intact with no issues.     Education Provided  Residual limb care;Correct ply sock adjustment    Person(s) Educated  Patient;Caregiver(s)    Education Method  Explanation;Demonstration;Verbal cues    Education Method  Verbalized understanding;Returned demonstration;Verbal cues required    Donning Prosthesis  Modified independent (device/increased time)    Doffing Prosthesis  Modified independent (device/increased time)               PT Short Term Goals - 07/22/18 1026      PT SHORT TERM GOAL #1   Title  Patient able to donne  prosthesis without assistance or verbal cues. (All STGs Target Date: 07/25/2018)    Baseline  07/22/18: met today    Status  Achieved      PT SHORT TERM GOAL #2   Title  Patient tolerates daily prosthesis wear >8hrs total / day.     Baseline  07/22/18: met today    Status  Achieved      PT SHORT TERM GOAL #3   Title  Patient able to retrieve object from floor with RW support with supervision.     Baseline  07/22/18: met today    Time  --    Period  --    Status  Achieved      PT SHORT TERM GOAL #4   Title  Patient ambulates 17' with rolling walker & locked prosthesis with supervision    Baseline  07/22/18: met today    Time  --    Period  --    Status  Achieved      PT SHORT TERM GOAL #5   Title  Patient negotiates ramp & curb with RW & locked prosthesis with minimal guard.     Baseline  07/22/18; met today with ramp, partially met with curb    Time  --    Period  --    Status  Partially Met        PT Long Term Goals - 05/30/18 6004      PT LONG TERM GOAL #1   Title  Patient & family verbalize & demonstrate understanding of proper prosthetic care & patient donnes independently to enable safe use of prosthesis.   (All LTG Target Dates: 08/27/2018)    Baseline        Time  3    Period  Months    Status  Revised    Target Date  08/27/18      PT LONG TERM GOAL #2   Title  Patient tolerates wear of Transfemoral prosthesis  >75% of awake hours without skin issues or limb pain.     Baseline        Time  3    Period  Months    Status  On-going    Target Date  08/27/18      PT LONG TERM GOAL #3   Title  Standing balance with RW support: reaching 10" anteriorly, picks up object from floor, scans environment & manages clothes for toileting modified indepenent.     Baseline        Time  3    Period  Months    Status  On-going    Target Date  08/27/18      PT LONG TERM GOAL #4   Title  Patient ambulates 200' with RW & prosthesis with family supervision for limited community mobility.     Baseline        Time  3    Period  Months    Status  Revised    Target Date  08/27/18      PT LONG TERM GOAL #5   Title  Patient ambulates 39' around furniture with RW & prosthesis modified independent for household mobility.     Baseline        Time  3    Period  Months    Status  On-going    Target Date  08/27/18      PT LONG TERM GOAL #6   Title  Patient negotiates stairs with 2 rails, ramps & curbs with  RW & prosthesis with family assist for community access.     Baseline        Time  3    Period  Months    Status  Revised    Target Date  08/27/18            Plan - 08/11/18 1221    Clinical Impression Statement  Today's skilled session continued to address gait/barriers with RW/locked prosthesis. Continues to be limited due to increased pain in right hand with weight bearing on RW. The pt is making progress toward goals and should benefit from continued PT to progress toward unmet goals.     Rehab Potential  Good    Clinical Impairments Affecting Rehab Potential  pt unable to read / write and sister who is caregiver reports limited ability to read / write    PT Frequency  1x / week    PT Duration  12 weeks    PT Treatment/Interventions  ADLs/Self Care Home Management;DME Instruction;Gait training;Stair training;Functional mobility training;Therapeutic activities;Therapeutic exercise;Balance  training;Neuromuscular re-education;Patient/family education;Prosthetic Training;Passive range of motion;Vestibular;Canalith Repostioning    PT Next Visit Plan  Continue weekly work with walker and ramps & curbs progressing toward LTGs.     Consulted and Agree with Plan of Care  Patient;Family member/caregiver    Family Member Consulted  Nephew in law- Greg       Patient will benefit from skilled therapeutic intervention in order to improve the following deficits and impairments:  Abnormal gait, Decreased activity tolerance, Decreased balance, Decreased coordination, Decreased endurance, Decreased knowledge of use of DME, Decreased mobility, Decreased range of motion, Decreased scar mobility, Decreased strength, Impaired flexibility, Postural dysfunction, Obesity, Prosthetic Dependency  Visit Diagnosis: Muscle weakness (generalized)  Unsteadiness on feet  Other abnormalities of gait and mobility  Abnormal posture     Problem List Patient Active Problem List   Diagnosis Date Noted  . Atherosclerosis of native arteries of the extremities with gangrene ([place]) 11/01/2017  . Unilateral AKA, left ([place])   . Post-operative pain   . OSA (obstructive sleep apnea)   . Benign essential HTN   . Diabetes mellitus type 2 in obese ([place])   . Hyperkalemia   . Leukocytosis   . Acute blood loss anemia   . Amputation stump infection ([place]) 08/23/2017  . Amputation of left lower extremity above knee upon examination ([place]) 08/23/2017  . PAD (peripheral artery disease) ([place]) 07/23/2017  . Cellulitis 06/10/2017  . Hyponatremia 06/10/2017  . Fever 06/10/2017  . Rash 05/24/2017  . Diabetes ([place])   . HTN (hypertension)   . Allergic rhinitis   . Insomnia   . Depression     [facility], [place], [place] [address], [place], [place] [address] [phone] 08/11/18, 12:33 PM   Name: ILEIGH METTLER MRN: [id number] Date of Birth: June 23, 1956

## 2018-08-12 ENCOUNTER — Ambulatory Visit: Payer: Medicare HMO | Admitting: Podiatry

## 2018-08-12 ENCOUNTER — Encounter: Payer: Self-pay | Admitting: Podiatry

## 2018-08-12 VITALS — BP 116/65

## 2018-08-12 DIAGNOSIS — M79675 Pain in left toe(s): Secondary | ICD-10-CM

## 2018-08-12 DIAGNOSIS — M79674 Pain in right toe(s): Secondary | ICD-10-CM | POA: Diagnosis not present

## 2018-08-12 DIAGNOSIS — B351 Tinea unguium: Secondary | ICD-10-CM

## 2018-08-12 DIAGNOSIS — Z89421 Acquired absence of other right toe(s): Secondary | ICD-10-CM | POA: Diagnosis not present

## 2018-08-12 DIAGNOSIS — Z89612 Acquired absence of left leg above knee: Secondary | ICD-10-CM

## 2018-08-12 DIAGNOSIS — E1151 Type 2 diabetes mellitus with diabetic peripheral angiopathy without gangrene: Secondary | ICD-10-CM | POA: Diagnosis not present

## 2018-08-12 NOTE — Patient Instructions (Addendum)
Diabetes and Foot Care Diabetes may cause you to have problems because of poor blood supply (circulation) to your feet and legs. This may cause the skin on your feet to become thinner, break easier, and heal more slowly. Your skin may become dry, and the skin may peel and crack. You may also have nerve damage in your legs and feet causing decreased feeling in them. You may not notice minor injuries to your feet that could lead to infections or more serious problems. Taking care of your feet is one of the most important things you can do for yourself. Follow these instructions at home:  Wear shoes at all times, even in the house. Do not go barefoot. Bare feet are easily injured.  Check your feet daily for blisters, cuts, and redness. If you cannot see the bottom of your feet, use a mirror or ask someone for help.  Wash your feet with warm water (do not use hot water) and mild soap. Then pat your feet and the areas between your toes until they are completely dry. Do not soak your feet as this can dry your skin.  Apply a moisturizing lotion or petroleum jelly (that does not contain alcohol and is unscented) to the skin on your feet and to dry, brittle toenails. Do not apply lotion between your toes.  Trim your toenails straight across. Do not dig under them or around the cuticle. File the edges of your nails with an emery board or nail file.  Do not cut corns or calluses or try to remove them with medicine.  Wear clean socks or stockings every day. Make sure they are not too tight. Do not wear knee-high stockings since they may decrease blood flow to your legs.  Wear shoes that fit properly and have enough cushioning. To break in new shoes, wear them for just a few hours a day. This prevents you from injuring your feet. Always look in your shoes before you put them on to be sure there are no objects inside.  Do not cross your legs. This may decrease the blood flow to your feet.  If you find a  minor scrape, cut, or break in the skin on your feet, keep it and the skin around it clean and dry. These areas may be cleansed with mild soap and water. Do not cleanse the area with peroxide, alcohol, or iodine.  When you remove an adhesive bandage, be sure not to damage the skin around it.  If you have a wound, look at it several times a day to make sure it is healing.  Do not use heating pads or hot water bottles. They may burn your skin. If you have lost feeling in your feet or legs, you may not know it is happening until it is too late.  Make sure your health care provider performs a complete foot exam at least annually or more often if you have foot problems. Report any cuts, sores, or bruises to your health care provider immediately. Contact a health care provider if:  You have an injury that is not healing.  You have cuts or breaks in the skin.  You have an ingrown nail.  You notice redness on your legs or feet.  You feel burning or tingling in your legs or feet.  You have pain or cramps in your legs and feet.  Your legs or feet are numb.  Your feet always feel cold. Get help right away if:  There is increasing   redness, swelling, or pain in or around a wound.  There is a red line that goes up your leg.  Pus is coming from a wound.  You develop a fever or as directed by your health care provider.  You notice a bad smell coming from an ulcer or wound. This information is not intended to replace advice given to you by your health care provider. Make sure you discuss any questions you have with your health care provider. Document Released: 09/21/2000 Document Revised: 03/01/2016 Document Reviewed: 03/03/2013 Elsevier Interactive Patient Education  2017 [employer] Inc[employer]  Peripheral Vascular Disease Peripheral vascular disease (PVD) is a disease of the blood vessels that are not part of your heart and brain. A simple term for PVD is poor circulation. In most cases, PVD  narrows the blood vessels that carry blood from your heart to the rest of your body. This can result in a decreased supply of blood to your arms, legs, and internal organs, like your stomach or kidneys. However, it most often affects a person's lower legs and feet. There are two types of PVD.  Organic PVD. This is the more common type. It is caused by damage to the structure of blood vessels.  Functional PVD. This is caused by conditions that make blood vessels contract and tighten (spasm).  Without treatment, PVD tends to get worse over time. PVD can also lead to acute ischemic limb. This is when an arm or limb suddenly has trouble getting enough blood. This is a medical emergency. What are the causes? Each type of PVD has many different causes. The most common cause of PVD is buildup of a fatty material (plaque) inside of your arteries (atherosclerosis). Small amounts of plaque can break off from the walls of the blood vessels and become lodged in a smaller artery. This blocks blood flow and can cause acute ischemic limb. Other common causes of PVD include:  Blood clots that form inside of blood vessels.  Injuries to blood vessels.  Diseases that cause inflammation of blood vessels or cause blood vessel spasms.  Health behaviors and health history that increase your risk of developing PVD.  What increases the risk? You may have a greater risk of PVD if you:  Have a family history of PVD.  Have certain medical conditions, including: ? High cholesterol. ? Diabetes. ? High blood pressure (hypertension). ? Coronary heart disease. ? Past problems with blood clots. ? Past injury, such as burns or a broken bone. These may have damaged blood vessels in your limbs. ? Buerger disease. This is caused by inflamed blood vessels in your hands and feet. ? Some forms of arthritis. ? Rare birth defects that affect the arteries in your legs.  Use tobacco.  Do not get enough exercise.  Are  obese.  Are age 59 or older.  What are the signs or symptoms? PVD may cause many different symptoms. Your symptoms depend on what part of your body is not getting enough blood. Some common signs and symptoms include:  Cramps in your lower legs. This may be a symptom of poor leg circulation (claudication).  Pain and weakness in your legs while you are physically active that goes away when you rest (intermittent claudication).  Leg pain when at rest.  Leg numbness, tingling, or weakness.  Coldness in a leg or foot, especially when compared with the other leg.  Skin or hair changes. These can include: ? Hair loss. ? Shiny skin. ? Pale or bluish skin. ?  Thick toenails.  Inability to get or maintain an erection (erectile dysfunction).  People with PVD are more prone to developing ulcers and sores on their toes, feet, or legs. These may take longer than normal to heal. How is this diagnosed? Your health care provider may diagnose PVD from your signs and symptoms. The health care provider will also do a physical exam. You may have tests to find out what is causing your PVD and determine its severity. Tests may include:  Blood pressure recordings from your arms and legs and measurements of the strength of your pulses (pulse volume recordings).  Imaging studies using sound waves to take pictures of the blood flow through your blood vessels (Doppler ultrasound).  Injecting a dye into your blood vessels before having imaging studies using: ? X-rays (angiogram or arteriogram). ? Computer-generated X-rays (CT angiogram). ? A powerful electromagnetic field and a computer (magnetic resonance angiogram or MRA).  How is this treated? Treatment for PVD depends on the cause of your condition and the severity of your symptoms. It also depends on your age. Underlying causes need to be treated and controlled. These include long-lasting (chronic) conditions, such as diabetes, high cholesterol, and  high blood pressure. You may need to first try making lifestyle changes and taking medicines. Surgery may be needed if these do not work. Lifestyle changes may include:  Quitting smoking.  Exercising regularly.  Following a low-fat, low-cholesterol diet.  Medicines may include:  Blood thinners to prevent blood clots.  Medicines to improve blood flow.  Medicines to improve your blood cholesterol levels.  Surgical procedures may include:  A procedure that uses an inflated balloon to open a blocked artery and improve blood flow (angioplasty).  A procedure to put in a tube (stent) to keep a blocked artery open (stent implant).  Surgery to reroute blood flow around a blocked artery (peripheral bypass surgery).  Surgery to remove dead tissue from an infected wound on the affected limb.  Amputation. This is surgical removal of the affected limb. This may be necessary in cases of acute ischemic limb that are not improved through medical or surgical treatments.  Follow these instructions at home:  Take medicines only as directed by your health care provider.  Do not use any tobacco products, including cigarettes, chewing tobacco, or electronic cigarettes. If you need help quitting, ask your health care provider.  Lose weight if you are overweight, and maintain a healthy weight as directed by your health care provider.  Eat a diet that is low in fat and cholesterol. If you need help, ask your health care provider.  Exercise regularly. Ask your health care provider to suggest some good activities for you.  Use compression stockings or other mechanical devices as directed by your health care provider.  Take good care of your feet. ? Wear comfortable shoes that fit well. ? Check your feet often for any cuts or sores. Contact a health care provider if:  You have cramps in your legs while walking.  You have leg pain when you are at rest.  You have coldness in a leg or  foot.  Your skin changes.  You have erectile dysfunction.  You have cuts or sores on your feet that are not healing. Get help right away if:  Your arm or leg turns cold and blue.  Your arms or legs become red, warm, swollen, painful, or numb.  You have chest pain or trouble breathing.  You suddenly have weakness in your face, arm,  or leg.  You become very confused or lose the ability to speak.  You suddenly have a very bad headache or lose your vision. This information is not intended to replace advice given to you by your health care provider. Make sure you discuss any questions you have with your health care provider. Document Released: 11/01/2004 Document Revised: 03/01/2016 Document Reviewed: 03/04/2014 Elsevier Interactive Patient Education  2017 [employer].

## 2018-08-13 ENCOUNTER — Other Ambulatory Visit: Payer: Self-pay | Admitting: *Deleted

## 2018-08-13 ENCOUNTER — Encounter: Payer: Self-pay | Admitting: *Deleted

## 2018-08-13 NOTE — Patient Outreach (Signed)
[facility]) Care Management  08/13/2018  Danielle Buchanan September 27, 1956 [id number]   CSW will perform a case closure on patient, due to inability to maintain phone contact with patient, despite required number of phone attempts made and outreach letter mailed to patient's home allowing 10 business days for a response if patient is interested in continuing to receive social work services through CSW with [facility], [facility].  CSW will fax an update to patient's Primary Care Physician, Dr. [name] to ensure that they are aware of CSW's involvement with patient's plan of care.   Danford Bad, BSW, MSW, LCSW  Licensed Restaurant manager, [employer]  [address] [place], [place] [address] Physical Address-[address], [place], [place] [address] Toll Free Main # [phone] Fax # [phone] Cell # [phone]  Office # [phone] [email or web address]

## 2018-08-18 ENCOUNTER — Encounter: Payer: Self-pay | Admitting: Physical Therapy

## 2018-08-18 ENCOUNTER — Ambulatory Visit: Payer: Medicare HMO | Admitting: Physical Therapy

## 2018-08-18 DIAGNOSIS — M6281 Muscle weakness (generalized): Secondary | ICD-10-CM

## 2018-08-18 DIAGNOSIS — R293 Abnormal posture: Secondary | ICD-10-CM | POA: Diagnosis not present

## 2018-08-18 DIAGNOSIS — R2689 Other abnormalities of gait and mobility: Secondary | ICD-10-CM

## 2018-08-18 DIAGNOSIS — R2681 Unsteadiness on feet: Secondary | ICD-10-CM | POA: Diagnosis not present

## 2018-08-18 DIAGNOSIS — M25652 Stiffness of left hip, not elsewhere classified: Secondary | ICD-10-CM | POA: Diagnosis not present

## 2018-08-18 DIAGNOSIS — M79641 Pain in right hand: Secondary | ICD-10-CM | POA: Diagnosis not present

## 2018-08-18 NOTE — Patient Instructions (Signed)
Weight:  Pt & w/c=241.5#   Pt, w/c & prosthesis 250#, w/c 45.4# So prosthesis weighs 8.4# including shoe & liner Patient weight 206.6# without prosthesis & 215# with prosthesis  PT demo, instructed how to use scales at MD office & home

## 2018-08-18 NOTE — Therapy (Addendum)
[facility] [address] [place], [place], [address] Phone: [phone]   Fax:  [phone]   Progress Note Reporting Period 05/29/2018 to 08/18/2018  See note below for Objective Data and Assessment of Progress/Goals.       Physical Therapy Treatment  Patient Details  Name: Danielle Buchanan MRN: [id number] Date of Birth: 04-10-1956 Referring Provider (PT): Maurice Small, MD   Encounter Date: 08/18/2018  PT End of Session - 08/18/18 1425    Visit Number  30    Number of Visits  31    Date for PT Re-Evaluation  08/27/18    Authorization Type  [employer]    Authorization Time Period  $3400 OOP max has been met prior to PT evaluation. Visit limit Medicare guidelines    PT Start Time  6734    PT Stop Time  1100    PT Time Calculation (min)  45 min    Equipment Utilized During Treatment  Gait belt    Activity Tolerance  Patient tolerated treatment well;Patient limited by pain   limited by hand pain with increased pressure, rest breaks helped   Behavior During Therapy  WFL for tasks assessed/performed       Past Medical History:  Diagnosis Date  . Allergic rhinitis   . Depression   . Diabetes ([place])   . Dyslipidemia   . HTN (hypertension)   . Insomnia   . OSA (obstructive sleep apnea)    no cpap  . Urinary incontinence     Past Surgical History:  Procedure Laterality Date  . ABDOMINAL AORTOGRAM W/LOWER EXTREMITY N/A 06/24/2017   Procedure: ABDOMINAL AORTOGRAM W/LOWER EXTREMITY;  Surgeon: Waynetta Sandy, MD;  Location: [facility];  Service: Cardiovascular;  Laterality: N/A;  . ABDOMINAL AORTOGRAM W/LOWER EXTREMITY Right 10/09/2017   Procedure: ABDOMINAL AORTOGRAM W/LOWER EXTREMITY;  Surgeon: Waynetta Sandy, MD;  Location: [facility];  Service: Cardiovascular;  Laterality: Right;  . AMPUTATION Left 07/23/2017   Procedure: AMPUTATION  BELOW KNEE;  Surgeon: Waynetta Sandy, MD;  Location: [facility];  Service: Vascular;  Laterality: Left;  . AMPUTATION Left 08/12/2017   Procedure: REVISION BELOW KNEE;  Surgeon: Waynetta Sandy, MD;  Location: [facility];  Service: Vascular;  Laterality: Left;  . AMPUTATION Left 08/27/2017   Procedure: left ABOVE KNEE AMPUTATION;  Surgeon: Waynetta Sandy, MD;  Location: [facility];  Service: Vascular;  Laterality: Left;  . AMPUTATION Left 08/30/2017   Procedure: REVISION AMPUTATION ABOVE KNEE;  Surgeon: Serafina Mitchell, MD;  Location: [facility];  Service: Vascular;  Laterality: Left;  . AMPUTATION Right 11/04/2017   Procedure: AMPUTATION RIGHT FOURTH TOE;  Surgeon: Waynetta Sandy, MD;  Location: [facility];  Service: Vascular;  Laterality: Right;  . APPLICATION OF WOUND VAC Left 08/27/2017   Procedure: APPLICATION OF WOUND VAC;  Surgeon: Waynetta Sandy, MD;  Location: [facility];  Service: Vascular;  Laterality: Left;  . CARPAL TUNNEL RELEASE Right   . COLONOSCOPY    . LOWER EXTREMITY INTERVENTION Right 10/09/2017   Procedure: LOWER EXTREMITY INTERVENTION;  Surgeon: Waynetta Sandy, MD;  Location: [facility];  Service: Cardiovascular;  Laterality: Right;    There were no vitals filed for this visit.  Subjective Assessment - 08/18/18 1020    Subjective  Patient reports went to prosthetic company, [employer]. Bill Clinical biochemist is taking care of patients until they high someone. He took out some of pads and  reduced her socks. Patient reports she feels that she has gained weight but unable to weigh herself.     Patient is accompained by:  Family member    Pertinent History  left TFA, right 4th toe amputation, DM2, PAD, HTN, right carpal tunnel release,    Limitations  Lifting;Standing;Walking;House hold activities    Patient Stated Goals  Walk with prosthesis in home & in community    Currently in Pain?  Yes    Pain Score  2     Pain Location  Finger (Comment which one)    Pain Orientation   Right    Pain Descriptors / Indicators  Pins and needles    Pain Type  Chronic pain;Neuropathic pain    Pain Onset  More than a month ago    Pain Frequency  Constant    Aggravating Factors   weightbearing on walker    Pain Relieving Factors  rest                       [facility] Adult PT Treatment/Exercise - 08/18/18 1020      Transfers   Transfers  Sit to Stand;Stand to Sit    Sit to Stand  5: Supervision;With upper extremity assist;With armrests;From chair/3-in-1   to RW   Stand to Sit  5: Supervision;With upper extremity assist;With armrests;To chair/3-in-1   from RW     Ambulation/Gait   Ambulation/Gait  Yes    Ambulation/Gait Assistance  5: Supervision    Ambulation/Gait Assistance Details  PT demo technique / sequence to facilitate upright posture & less weight on UEs: moving RW after each foot & only to toe area of forward foot    Ambulation Distance (Feet)  125 Feet    Assistive device  Rolling walker;Prosthesis    Gait Pattern  Step-through pattern;Decreased stride length;Decreased stance time - left;Decreased step length - right      Prosthetics   Prosthetic Care Comments   Saw another prosthetist at [facility] who stated she did not need a socket revision. He cut down the top of current socket and advised pt not to wear socks with it so she sinks all the way in. Reviewed with pt and niece the signs that socks would be needed.     Current prosthetic wear tolerance (days/week)   daily    Current prosthetic wear tolerance (#hours/day)   5-6 hrs PT recommended increasing to most of awake hours.     Residual limb condition   intact with no issues.     Education Provided  Residual limb care;Correct ply sock adjustment    Person(s) Educated  Patient;Caregiver(s)    Education Method  Explanation;Verbal cues    Education Method  Verbalized understanding;Verbal cues required;Needs further [employer] Prosthesis  Supervision             PT Education -  08/18/18 1050    Education Details  weight of pt & prosthesis, use of scales     Person(s) Educated  Patient    Methods  Explanation;Handout;Verbal cues;Demonstration    Comprehension  Verbalized understanding;Need further instruction       PT Short Term Goals - 07/22/18 1026      PT SHORT TERM GOAL #1   Title  Patient able to donne prosthesis without assistance or verbal cues. (All STGs Target Date: 07/25/2018)    Baseline  07/22/18: met today    Status  Achieved      PT SHORT  TERM GOAL #2   Title  Patient tolerates daily prosthesis wear >8hrs total / day.     Baseline  07/22/18: met today    Status  Achieved      PT SHORT TERM GOAL #3   Title  Patient able to retrieve object from floor with RW support with supervision.     Baseline  07/22/18: met today    Time  --    Period  --    Status  Achieved      PT SHORT TERM GOAL #4   Title  Patient ambulates 32' with rolling walker & locked prosthesis with supervision    Baseline  07/22/18: met today    Time  --    Period  --    Status  Achieved      PT SHORT TERM GOAL #5   Title  Patient negotiates ramp & curb with RW & locked prosthesis with minimal guard.     Baseline  07/22/18; met today with ramp, partially met with curb    Time  --    Period  --    Status  Partially Met        PT Long Term Goals - 05/30/18 0272      PT LONG TERM GOAL #1   Title  Patient & family verbalize & demonstrate understanding of proper prosthetic care & patient donnes independently to enable safe use of prosthesis.   (All LTG Target Dates: 08/27/2018)    Baseline        Time  3    Period  Months    Status  Revised    Target Date  08/27/18      PT LONG TERM GOAL #2   Title  Patient tolerates wear of Transfemoral prosthesis >75% of awake hours without skin issues or limb pain.     Baseline        Time  3    Period  Months    Status  On-going    Target Date  08/27/18      PT LONG TERM GOAL #3   Title  Standing balance with RW  support: reaching 10" anteriorly, picks up object from floor, scans environment & manages clothes for toileting modified indepenent.     Baseline        Time  3    Period  Months    Status  On-going    Target Date  08/27/18      PT LONG TERM GOAL #4   Title  Patient ambulates 200' with RW & prosthesis with family supervision for limited community mobility.     Baseline        Time  3    Period  Months    Status  Revised    Target Date  08/27/18      PT LONG TERM GOAL #5   Title  Patient ambulates 72' around furniture with RW & prosthesis modified independent for household mobility.     Baseline        Time  3    Period  Months    Status  On-going    Target Date  08/27/18      PT LONG TERM GOAL #6   Title  Patient negotiates stairs with 2 rails, ramps & curbs with RW & prosthesis with family assist for community access.     Baseline        Time  3    Period  Months  Status  Revised    Target Date  08/27/18            Plan - 08/18/18 1426    Clinical Impression Statement  Patient is on target to meet LTGs set for this certification period. She wants to continue PT to improve function in community herself.     Rehab Potential  Good    Clinical Impairments Affecting Rehab Potential  pt unable to read / write and sister who is caregiver reports limited ability to read / write    PT Frequency  1x / week    PT Duration  12 weeks    PT Treatment/Interventions  ADLs/Self Care Home Management;DME Instruction;Gait training;Stair training;Functional mobility training;Therapeutic activities;Therapeutic exercise;Balance training;Neuromuscular re-education;Patient/family education;Prosthetic Training;Passive range of motion;Vestibular;Canalith Repostioning    PT Next Visit Plan  check LTGs & recertify 1x/wk    Consulted and Agree with Plan of Care  Patient;Family member/caregiver    Family Member Consulted  Nephew in law- Greg       Patient will benefit from skilled therapeutic  intervention in order to improve the following deficits and impairments:  Abnormal gait, Decreased activity tolerance, Decreased balance, Decreased coordination, Decreased endurance, Decreased knowledge of use of DME, Decreased mobility, Decreased range of motion, Decreased scar mobility, Decreased strength, Impaired flexibility, Postural dysfunction, Obesity, Prosthetic Dependency  Visit Diagnosis: Muscle weakness (generalized)  Unsteadiness on feet  Other abnormalities of gait and mobility  Abnormal posture  Stiffness of left hip, not elsewhere classified     Problem List Patient Active Problem List   Diagnosis Date Noted  . Pain in finger of right hand 02/20/2018  . Atherosclerosis of native arteries of the extremities with gangrene ([place]) 11/01/2017  . Unilateral AKA, left ([place])   . Post-operative pain   . OSA (obstructive sleep apnea)   . Benign essential HTN   . Diabetes mellitus type 2 in obese ([place])   . Hyperkalemia   . Leukocytosis   . Acute blood loss anemia   . Amputation stump infection ([place]) 08/23/2017  . Amputation of left lower extremity above knee upon examination ([place]) 08/23/2017  . PAD (peripheral artery disease) ([place]) 07/23/2017  . Cellulitis 06/10/2017  . Hyponatremia 06/10/2017  . Fever 06/10/2017  . Rash 05/24/2017  . Diabetes ([place])   . HTN (hypertension)   . Allergic rhinitis   . Insomnia   . Depression     , PT, DPT 08/18/2018, 2:28 PM  [facility] [address] [place], [place], [address] Phone: [phone]   Fax:  [phone]  Name: MITALI SHENEFIELD MRN: [id number] Date of Birth: 27-Feb-1956

## 2018-08-25 ENCOUNTER — Encounter: Payer: Self-pay | Admitting: Physical Therapy

## 2018-08-25 ENCOUNTER — Ambulatory Visit: Payer: Medicare HMO | Admitting: Physical Therapy

## 2018-08-25 DIAGNOSIS — R2681 Unsteadiness on feet: Secondary | ICD-10-CM

## 2018-08-25 DIAGNOSIS — M25652 Stiffness of left hip, not elsewhere classified: Secondary | ICD-10-CM | POA: Diagnosis not present

## 2018-08-25 DIAGNOSIS — M6281 Muscle weakness (generalized): Secondary | ICD-10-CM | POA: Diagnosis not present

## 2018-08-25 DIAGNOSIS — M79641 Pain in right hand: Secondary | ICD-10-CM | POA: Diagnosis not present

## 2018-08-25 DIAGNOSIS — R293 Abnormal posture: Secondary | ICD-10-CM | POA: Diagnosis not present

## 2018-08-25 DIAGNOSIS — R2689 Other abnormalities of gait and mobility: Secondary | ICD-10-CM

## 2018-08-26 NOTE — Therapy (Signed)
[facility] [address] [place], [place], [address] Phone: [phone]   Fax:  [phone]  Physical Therapy Treatment  Patient Details  Name: Danielle Buchanan MRN: [id number] Date of Birth: 1956-01-10 Referring Provider (PT): Maurice Small, MD   Encounter Date: 08/25/2018  PT End of Session - 08/25/18 1800    Visit Number  31    Number of Visits  44    Date for PT Re-Evaluation  11/24/18    Authorization Type  [employer]    Authorization Time Period  $3400 OOP max has been met prior to PT evaluation. Visit limit Medicare guidelines    PT Start Time  8563    PT Stop Time  1100    PT Time Calculation (min)  45 min    Equipment Utilized During Treatment  Gait belt    Activity Tolerance  Patient tolerated treatment well;Patient limited by pain   limited by hand pain with increased pressure, rest breaks helped   Behavior During Therapy  WFL for tasks assessed/performed       Past Medical History:  Diagnosis Date  . Allergic rhinitis   . Depression   . Diabetes ([facility])   . Dyslipidemia   . HTN (hypertension)   . Insomnia   . OSA (obstructive sleep apnea)    no cpap  . Urinary incontinence     Past Surgical History:  Procedure Laterality Date  . ABDOMINAL AORTOGRAM W/LOWER EXTREMITY N/A 06/24/2017   Procedure: ABDOMINAL AORTOGRAM W/LOWER EXTREMITY;  Surgeon: Waynetta Sandy, MD;  Location: [facility];  Service: Cardiovascular;  Laterality: N/A;  . ABDOMINAL AORTOGRAM W/LOWER EXTREMITY Right 10/09/2017   Procedure: ABDOMINAL AORTOGRAM W/LOWER EXTREMITY;  Surgeon: Waynetta Sandy, MD;  Location: [facility];  Service: Cardiovascular;  Laterality: Right;  . AMPUTATION Left 07/23/2017   Procedure: AMPUTATION  BELOW KNEE;  Surgeon: Waynetta Sandy, MD;  Location: [facility];  Service: Vascular;  Laterality: Left;  . AMPUTATION Left 08/12/2017   Procedure: REVISION BELOW KNEE;   Surgeon: Waynetta Sandy, MD;  Location: [facility];  Service: Vascular;  Laterality: Left;  . AMPUTATION Left 08/27/2017   Procedure: left ABOVE KNEE AMPUTATION;  Surgeon: Waynetta Sandy, MD;  Location: [facility];  Service: Vascular;  Laterality: Left;  . AMPUTATION Left 08/30/2017   Procedure: REVISION AMPUTATION ABOVE KNEE;  Surgeon: Serafina Mitchell, MD;  Location: [facility];  Service: Vascular;  Laterality: Left;  . AMPUTATION Right 11/04/2017   Procedure: AMPUTATION RIGHT FOURTH TOE;  Surgeon: Waynetta Sandy, MD;  Location: [facility];  Service: Vascular;  Laterality: Right;  . APPLICATION OF WOUND VAC Left 08/27/2017   Procedure: APPLICATION OF WOUND VAC;  Surgeon: Waynetta Sandy, MD;  Location: [facility];  Service: Vascular;  Laterality: Left;  . CARPAL TUNNEL RELEASE Right   . COLONOSCOPY    . LOWER EXTREMITY INTERVENTION Right 10/09/2017   Procedure: LOWER EXTREMITY INTERVENTION;  Surgeon: Waynetta Sandy, MD;  Location: [facility];  Service: Cardiovascular;  Laterality: Right;    There were no vitals filed for this visit.  Subjective Assessment - 08/25/18 1015    Subjective  Prosthetic company has hired new Arts administrator, Geographical information systems officer. They don't think she is ready for socket revision. She has made progress but needs additional care.     Patient is accompained by:  Family member    Pertinent History  left TFA, right 4th toe amputation, DM2, PAD, HTN, right  carpal tunnel release,    Limitations  Lifting;Standing;Walking;House hold activities    Patient Stated Goals  Walk with prosthesis in home & in community    Currently in Pain?  Yes    Pain Score  7     Pain Location  Finger (Comment which one)   little   Pain Orientation  Right    Pain Descriptors / Indicators  Pins and needles;Sharp    Pain Type  Neuropathic pain;Chronic pain    Pain Onset  More than a month ago    Pain Frequency  Constant    Aggravating Factors   weightbearing on walker     Pain Relieving Factors  contrast bath          Prosthetics Assessment - 08/25/18 1015      Prosthetics   Prosthetic Care Independent with  Skin check;Prosthetic cleaning;Residual limb care;Care of non-amputated limb;Ply sock cleaning    Prosthetic Care Dependent with  Correct ply sock adjustment;Proper wear schedule/adjustment;Proper weight-bearing schedule/adjustment;Other (comment)   when need to see prosthetist   Donning prosthesis   Supervision   cues on full tightening strap   Doffing prosthesis   Modified independent (Device/Increase time)    Current prosthetic wear tolerance (days/week)   daily    Current prosthetic wear tolerance (#hours/day)   ~75-87% of awake hours: donne upon arising, removes for mid-day nap, redonnes & removes for evening bath. Does not redonne so no prosthesis 2-4 hours after bath until bedtime.     Current prosthetic weight-bearing tolerance (hours/day)   Patient tolerates standing 5-10 minutes with no leg pain but right hand / little finger pain with weight bearing on RW.     Edema  pitting edema with 3 second refill    Residual limb condition   intact with no issues.     K code/activity level with prosthetic use   K2 limited community with fixed cadence  Prosthesis has manual lock knee posteriorly offset, single axis foot, silicon liner with velcro lateral suspension.                    [facility] Adult PT Treatment/Exercise - 08/25/18 1015      Transfers   Transfers  Sit to Stand;Stand to [employer] Transfers    Sit to Stand  6: Modified independent (Device/Increase time);With upper extremity assist;With armrests;From chair/3-in-1;Other (comment)   to RW for stabilization, locking knee upon arising   Stand to Sit  6: Modified independent (Device/Increase time);With upper extremity assist;With armrests;To chair/3-in-1;Other (comment)   from RW, unlocks prosthetic knee after sitting   Stand Pivot Transfers  6: Modified independent  (Device/Increase time)   with RW & locked prosthetic knee     Ambulation/Gait   Ambulation/Gait  Yes    Ambulation/Gait Assistance  5: Supervision    Ambulation/Gait Assistance Details  prosthetic knee is locked. Excessive UE weight bearing on RW. Pain in right hand limits distance    Ambulation Distance (Feet)  160 Feet   distance limited by Rt hand pain   Assistive device  Rolling walker;Prosthesis   TFA manual lock knee prosthesis   Gait Pattern  Step-through pattern;Decreased step length - right;Decreased stance time - left;Left hip hike;Left circumduction;Antalgic;Trunk flexed;Abducted - left;Poor foot clearance - left    Ambulation Surface  Indoor;Level    Gait velocity  0.50 ft/sec    Ramp  5: Supervision   RW & locked knee TFA prosthesis   Curb  5: Supervision   RW & locked  knee TFA prosthesis     Balance   Balance Assessed  Yes      Static Standing Balance   Static Standing - Balance Support  Bilateral upper extremity supported    Static Standing - Level of Assistance  6: Modified independent (Device/Increase time)    Static Standing - Comment/# of Minutes  2      Dynamic Standing Balance   Dynamic Standing - Balance Support  Left upper extremity supported    Dynamic Standing - Level of Assistance  6: Modified independent (Device/Increase time)    Dynamic Standing - Balance Activities  Other (comment)    Reaching for objects comments:  5" anteriorly    Head turns comments:  turns head to side, minimal to no trunk rotation or weight shift    Head nods comments:  cervical motion only, minimal to no trunk motion    Dynamic Standing - Comments  functional tasks of managing pants, alternate UE support on RW, requires supervision      Prosthetics   Prosthetic Care Comments   Pt reports that she removes prosthesis for toileting. PT instructed with demo in technique for toileting with TFA prosthesis. Pt reports issues with incontinence that appears to be urgency issues that  limits ability to get pants down before urination.  PT instructed in general urinary incontinence education.     Education Provided  Proper wear schedule/adjustment;Other (comment)   see prosthetic care comments   Person(s) Educated  Patient;Caregiver(s)    Education Method  Explanation;Demonstration;Tactile cues;Verbal cues    Education Method  Verbalized understanding;Returned demonstration;Tactile cues required;Verbal cues required;Needs further instruction    Donning Prosthesis  Supervision    Doffing Prosthesis  Modified independent (device/increased time)               PT Short Term Goals - 07/22/18 1026      PT SHORT TERM GOAL #1   Title  Patient able to donne prosthesis without assistance or verbal cues. (All STGs Target Date: 07/25/2018)    Baseline  07/22/18: met today    Status  Achieved      PT SHORT TERM GOAL #2   Title  Patient tolerates daily prosthesis wear >8hrs total / day.     Baseline  07/22/18: met today    Status  Achieved      PT SHORT TERM GOAL #3   Title  Patient able to retrieve object from floor with RW support with supervision.     Baseline  07/22/18: met today    Time  --    Period  --    Status  Achieved      PT SHORT TERM GOAL #4   Title  Patient ambulates 83' with rolling walker & locked prosthesis with supervision    Baseline  07/22/18: met today    Time  --    Period  --    Status  Achieved      PT SHORT TERM GOAL #5   Title  Patient negotiates ramp & curb with RW & locked prosthesis with minimal guard.     Baseline  07/22/18; met today with ramp, partially met with curb    Time  --    Period  --    Status  Partially Met        PT Short Term Goals - 08/26/18 1906      PT SHORT TERM GOAL #1   Title  Patient verbalizes how to proper manage sweat with prosthesis. (All  STGs Target Date: 09/30/2018)    Time  1    Period  Months    Status  New    Target Date  09/30/18      PT SHORT TERM GOAL #2   Title  Patient performs sit  to/from stand and stand-pivot transfers with prosthetic knee unlocked safely with supervision.     Time  1    Period  Months    Status  New    Target Date  09/30/18      PT SHORT TERM GOAL #3   Title  Patient standing balance with intermittent UE support dynamic scanning & static 1 minute no UE support with supervision.     Time  1    Period  Months    Status  New    Target Date  09/30/18      PT SHORT TERM GOAL #4   Title  Patient ambulates 180' with RW & prosthesis with supervision.     Time  1    Period  Months    Status  New    Target Date  09/30/18        PT Long Term Goals - 08/25/18 1219      PT LONG TERM GOAL #1   Title  Patient & family verbalize & demonstrate understanding of proper prosthetic care & patient donnes independently to enable safe use of prosthesis.   (All LTG Target Dates: 08/27/2018)    Baseline  Partially MET 08/25/2018 She still requires some cues on full tightening suspension strap & problem solving prosthetic issues.     Time  3    Period  Months    Status  Partially Met      PT LONG TERM GOAL #2   Title  Patient tolerates wear of Transfemoral prosthesis >75% of awake hours without skin issues or limb pain.     Baseline  MET 08/25/2018    Time  3    Period  Months    Status  Achieved      PT LONG TERM GOAL #3   Title  Standing balance with RW support: reaching 10" anteriorly, picks up object from floor, scans environment & manages clothes for toileting modified indepenent.     Baseline  Partially MET 08/25/2018 standing balance with RW support: reaches 5", reaches to knee level towards floor, scans with cervical motion & manages pants modified independent; but needs minA or close supervision for full motions noted.     Time  3    Period  Months    Status  Partially Met      PT LONG TERM GOAL #4   Title  Patient ambulates 200' with RW & prosthesis with family supervision for limited community mobility.     Baseline  Partially MET 08/25/2018   Pt ambulates 160' (distance limited by Right hand pain) with RW & prosthesis with family support    Time  3    Period  Months    Status  Partially Met      PT LONG TERM GOAL #5   Title  Patient ambulates 50' around furniture with RW & prosthesis modified independent for household mobility.     Baseline  MET 08/25/2018  Patient reports ability to walk in home with RW & prosthesis safely.     Time  3    Period  Months    Status  Achieved      PT LONG TERM GOAL #6   Title  Patient negotiates stairs with 2 rails, ramps & curbs with RW & prosthesis with family assist for community access.     Baseline  MET 08/25/2018    Time  3    Period  Months    Status  Achieved        PT Long Term Goals - 08/26/18 1900      PT LONG TERM GOAL #1   Title  Patient verbalizes & demonstrates understanding of prosthetic care & wears >80% of awake hours to enable safe use of prosthesis. (All LTGs Target Date: 11/21/2018)     Time  3    Period  Months    Status  New    Target Date  11/21/18      PT LONG TERM GOAL #2   Title  Berg Balance >36/56 to indicate lower fall risk.     Time  3    Period  Months    Status  New    Target Date  11/21/18      PT LONG TERM GOAL #3   Title  Patient ambulates 250' with RW & proshthesis modified independent to enable independent community mobility.     Time  3    Period  Weeks    Status  Revised    Target Date  11/21/18      PT LONG TERM GOAL #4   Title  Patient negotiates ramps & curbs with RW & prosthesis modified independent.     Time  3    Period  Months    Status  Revised    Target Date  11/21/18            Plan - 08/25/18 1219    Clinical Impression Statement  Patient met or partially met all LTGs set for this plan of care. She achieved overall goal to be independent in her home & access community with family supervision /assist. Patient was living with her sister who passed away suddenly near beginning of PT plan of care. She has a niece & her  husband who live close and are providing care / assistance. Patient's goal is to be able to go out in community by herself again. Therefore PT is going to recertify for another 90 day period to meet upgraded level of function.     Rehab Potential  Good    Clinical Impairments Affecting Rehab Potential  pt unable to read / write and sister who is caregiver reports limited ability to read / write    PT Frequency  1x / week    PT Duration  12 weeks    PT Treatment/Interventions  ADLs/Self Care Home Management;DME Instruction;Gait training;Stair training;Functional mobility training;Therapeutic activities;Therapeutic exercise;Balance training;Neuromuscular re-education;Patient/family education;Prosthetic Training;Passive range of motion;Vestibular;Canalith Repostioning    PT Next Visit Plan  initiate prothetic gait, transfers & sit/stand with prosthetic knee unlocked    Consulted and Agree with Plan of Care  Patient;Family member/caregiver    Family Member Consulted  Nephew in law- Greg       Patient will benefit from skilled therapeutic intervention in order to improve the following deficits and impairments:  Abnormal gait, Decreased activity tolerance, Decreased balance, Decreased coordination, Decreased endurance, Decreased knowledge of use of DME, Decreased mobility, Decreased range of motion, Decreased scar mobility, Decreased strength, Impaired flexibility, Postural dysfunction, Obesity, Prosthetic Dependency  Visit Diagnosis: Muscle weakness (generalized)  Unsteadiness on feet  Other abnormalities of gait and mobility  Abnormal posture  Stiffness of left hip, not elsewhere classified  Pain in right hand     Problem List Patient Active Problem List   Diagnosis Date Noted  . Pain in finger of right hand 02/20/2018  . Atherosclerosis of native arteries of the extremities with gangrene ([facility]) 11/01/2017  . Unilateral AKA, left ([facility])   . Post-operative pain   . OSA (obstructive  sleep apnea)   . Benign essential HTN   . Diabetes mellitus type 2 in obese ([facility])   . Hyperkalemia   . Leukocytosis   . Acute blood loss anemia   . Amputation stump infection ([facility]) 08/23/2017  . Amputation of left lower extremity above knee upon examination ([facility]) 08/23/2017  . PAD (peripheral artery disease) ([facility]) 07/23/2017  . Cellulitis 06/10/2017  . Hyponatremia 06/10/2017  . Fever 06/10/2017  . Rash 05/24/2017  . Diabetes ([facility])   . HTN (hypertension)   . Allergic rhinitis   . Insomnia   . Depression     Landry Kamath PT, DPT 08/26/2018, 12:30 PM  [facility] [address] [place], [place], [address] Phone: [phone]   Fax:  [phone]  Name: ANAISHA MAGO MRN: [id number] Date of Birth: 1956/06/06

## 2018-08-30 ENCOUNTER — Encounter: Payer: Self-pay | Admitting: Podiatry

## 2018-08-30 NOTE — Progress Notes (Signed)
Subjective: Danielle ContesJanice M Tuckett presents today with cc of  painful, discolored, thick toenails right LE. She presents to clinic with her nephew and niece. Per nephew, she has PAD and h/o amputation of left above knee amputation and right 4th toe.   She also has history of HTN, NIDDM and depression.  Past Medical History:  Diagnosis Date  . Allergic rhinitis   . Depression   . Diabetes ([facility])   . Dyslipidemia   . HTN (hypertension)   . Insomnia   . OSA (obstructive sleep apnea)    no cpap  . Urinary incontinence    Patient Active Problem List   Diagnosis Date Noted  . Pain in finger of right hand 02/20/2018  . Atherosclerosis of native arteries of the extremities with gangrene ([facility]) 11/01/2017  . Unilateral AKA, left ([facility])   . Post-operative pain   . OSA (obstructive sleep apnea)   . Benign essential HTN   . Diabetes mellitus type 2 in obese ([facility])   . Hyperkalemia   . Leukocytosis   . Acute blood loss anemia   . Amputation stump infection ([facility]) 08/23/2017  . Amputation of left lower extremity above knee upon examination ([facility]) 08/23/2017  . PAD (peripheral artery disease) ([facility]) 07/23/2017  . Cellulitis 06/10/2017  . Hyponatremia 06/10/2017  . Fever 06/10/2017  . Rash 05/24/2017  . Diabetes ([facility])   . HTN (hypertension)   . Allergic rhinitis   . Insomnia   . Depression    Past Surgical History:  Procedure Laterality Date  . ABDOMINAL AORTOGRAM W/LOWER EXTREMITY N/A 06/24/2017   Procedure: ABDOMINAL AORTOGRAM W/LOWER EXTREMITY;  Surgeon: Maeola Harmanain, Brandon Christopher, MD;  [facility];  Service: Cardiovascular;  Laterality: N/A;  . ABDOMINAL AORTOGRAM W/LOWER EXTREMITY Right 10/09/2017   Procedure: ABDOMINAL AORTOGRAM W/LOWER EXTREMITY;  Surgeon: Maeola Harmanain, Brandon Christopher, MD;  [facility];  Service: Cardiovascular;  Laterality: Right;  . AMPUTATION Left 07/23/2017   Procedure: AMPUTATION  BELOW KNEE;  Surgeon: Maeola Harmanain, Brandon Christopher, MD;   [facility];  Service: Vascular;  Laterality: Left;  . AMPUTATION Left 08/12/2017   Procedure: REVISION BELOW KNEE;  Surgeon: Maeola Harmanain, Brandon Christopher, MD;  [facility];  Service: Vascular;  Laterality: Left;  . AMPUTATION Left 08/27/2017   Procedure: left ABOVE KNEE AMPUTATION;  Surgeon: Maeola Harmanain, Brandon Christopher, MD;  [facility];  Service: Vascular;  Laterality: Left;  . AMPUTATION Left 08/30/2017   Procedure: REVISION AMPUTATION ABOVE KNEE;  Surgeon: Nada LibmanBrabham, Vance W, MD;  [facility];  Service: Vascular;  Laterality: Left;  . AMPUTATION Right 11/04/2017   Procedure: AMPUTATION RIGHT FOURTH TOE;  Surgeon: Maeola Harmanain, Brandon Christopher, MD;  [facility];  Service: Vascular;  Laterality: Right;  . APPLICATION OF WOUND VAC Left 08/27/2017   Procedure: APPLICATION OF WOUND VAC;  Surgeon: Maeola Harmanain, Brandon Christopher, MD;  [facility];  Service: Vascular;  Laterality: Left;  . CARPAL TUNNEL RELEASE Right   . COLONOSCOPY    . LOWER EXTREMITY INTERVENTION Right 10/09/2017   Procedure: LOWER EXTREMITY INTERVENTION;  Surgeon: Maeola Harmanain, Brandon Christopher, MD;  [facility];  Service: Cardiovascular;  Laterality: Right;   Current Outpatient Medications:  .  amitriptyline (ELAVIL) 25 MG tablet, Take 25 mg by mouth at bedtime., Disp: , Rfl:  .  aspirin (ASPIRIN 81) 81 MG chewable tablet, Aspir-81, Disp: , Rfl:  .  aspirin EC 325 MG tablet, Take 325 mg by mouth daily., Disp: , Rfl:  .  clopidogrel (  PLAVIX) 75 MG tablet, Take 1 tablet (75 mg total) by mouth daily., Disp: 30 tablet, Rfl: 3 .  insulin NPH-regular Human (NOVOLIN 70/30) (70-30) 100 UNIT/ML injection, 120 units with breakfast, and 90 units with supper, and syringes 3/day., Disp: 210 mL, Rfl: 3 .  losartan (COZAAR) 100 MG tablet, Take 100 mg by mouth daily., Disp: , Rfl:  .  Melatonin 10 MG TABS, Take 20 mg by mouth at bedtime., Disp: , Rfl:  .  nicotine polacrilex (NICORELIEF) 2 MG gum, Take 2 mg by mouth as  needed for smoking cessation., Disp: , Rfl:  .  pravastatin (PRAVACHOL) 20 MG tablet, Take 20 mg by mouth daily. , Disp: , Rfl:  .  sertraline (ZOLOFT) 100 MG tablet, Take 150 mg by mouth daily. , Disp: , Rfl:  .  traZODone (DESYREL) 50 MG tablet, Take 50 mg by mouth at bedtime., Disp: , Rfl:  .  VITAMIN E PO, Take 180 mg by mouth daily., Disp: , Rfl:  .  Zinc 50 MG TABS, Take 50 mg by mouth daily., Disp: , Rfl:    Allergies  Allergen Reactions  . Milk-Related Compounds     incontinence  . Doxycycline Rash   Social History   Tobacco Use  Smoking Status Light Tobacco Smoker  . Packs/day: 1.00  . Years: 51.00  . Pack years: 51.00  . Types: Cigarettes  . Last attempt to quit: 06/08/2017  . Years since quitting: 1.2  Smokeless Tobacco Never Used   Family History  Problem Relation Age of Onset  . Heart attack Mother   . Alcohol abuse Mother   . Heart attack Father   . Alcohol abuse Father   . Heart attack Sister   . Heart attack Brother   . Hypercholesterolemia Sister   . Diabetes Neg Hx    ROS:   Objective: Vitals:   08/12/18 1457  BP: 116/65   Vascular Examination: Capillary refill time less than 3 seconds to 4 digits of the right lower extremity Dorsalis pedis palpable right lower extremity  Posterior tibial pulses  No digital hair x4 digits right lower extremity Skin temperature within normal limits right lower extremity  Dermatological Examination: Skin thin and atrophic b/l Toenails 1, 2, 3, and 5 right foot discolored, thick,  with subungual debris   Musculoskeletal: Muscle strength 5/5 to all LE muscle groups of the right lower extremity  Neurological: Sensation intact with 10 gram monofilament. Vibratory sensation intact.  Assessment: Painful onychomycosis toenails 1, 2, 3, and 5 right foot  Status post amputation left above-knee amputation, right fourth toe Diabetes with peripheral arterial disease  Plan: 1. Discussed diagnoses on today.   Literature dispensed to patient and family. 2. Toenails 1, 2, 3, and 5 right foot were debrided in length and girth without iatrogenic bleeding. 3. Patient to continue soft, supportive shoe gear 4. Patient to report any pedal injuries to medical professional immediately. 5. Follow up 9 weeks.  Patient/POA to call should there be a concern in the interim.

## 2018-09-02 ENCOUNTER — Ambulatory Visit: Payer: Medicare HMO | Admitting: Physical Therapy

## 2018-09-02 ENCOUNTER — Telehealth: Payer: Self-pay | Admitting: Physical Therapy

## 2018-09-02 ENCOUNTER — Encounter: Payer: Self-pay | Admitting: Physical Therapy

## 2018-09-02 DIAGNOSIS — M79641 Pain in right hand: Secondary | ICD-10-CM | POA: Diagnosis not present

## 2018-09-02 DIAGNOSIS — M25652 Stiffness of left hip, not elsewhere classified: Secondary | ICD-10-CM | POA: Diagnosis not present

## 2018-09-02 DIAGNOSIS — R2681 Unsteadiness on feet: Secondary | ICD-10-CM

## 2018-09-02 DIAGNOSIS — R2689 Other abnormalities of gait and mobility: Secondary | ICD-10-CM

## 2018-09-02 DIAGNOSIS — R293 Abnormal posture: Secondary | ICD-10-CM | POA: Diagnosis not present

## 2018-09-02 DIAGNOSIS — M6281 Muscle weakness (generalized): Secondary | ICD-10-CM | POA: Diagnosis not present

## 2018-09-02 NOTE — Telephone Encounter (Signed)
Dr. Duanne LimerickWebb Danielle Buchanan needs a 3 in 1 Beside Commode with removable armrests to improve safety with toileting. Can you please place an order in Epic or FAX to [phone]? Please contact me with any questions Danielle Buchanan, PT, DPT PT Specializing in Prosthetics & Orthotics 09/02/2018@ 12:16 PM Phone:  [phone]  Fax:  [phone] [facility] [address] [place], [place] [address]

## 2018-09-03 NOTE — Therapy (Signed)
[facility] [address] [place], [place], [address] Phone: [phone]   Fax:  [phone]  Physical Therapy Treatment  Patient Details  Name: Danielle Buchanan MRN: [id number] Date of Birth: [birth year] Referring Provider (PT): Maurice Small, MD   Encounter Date: 09/02/2018  PT End of Session - 09/02/18 1200    Visit Number  32    Number of Visits  44    Date for PT Re-Evaluation  11/21/18    Authorization Type  [employer]    Authorization Time Period  $3400 OOP max has been met prior to PT evaluation. Visit limit Medicare guidelines    PT Start Time  1100    PT Stop Time  1145    PT Time Calculation (min)  45 min    Equipment Utilized During Treatment  Gait belt    Activity Tolerance  Patient tolerated treatment well;Patient limited by pain   limited by hand pain with increased pressure, rest breaks helped   Behavior During Therapy  WFL for tasks assessed/performed       Past Medical History:  Diagnosis Date  . Allergic rhinitis   . Depression   . Diabetes ([place])   . Dyslipidemia   . HTN (hypertension)   . Insomnia   . OSA (obstructive sleep apnea)    no cpap  . Urinary incontinence     Past Surgical History:  Procedure Laterality Date  . ABDOMINAL AORTOGRAM W/LOWER EXTREMITY N/A 06/24/2017   Procedure: ABDOMINAL AORTOGRAM W/LOWER EXTREMITY;  Surgeon: Waynetta Sandy, MD;  Location: [facility];  Service: Cardiovascular;  Laterality: N/A;  . ABDOMINAL AORTOGRAM W/LOWER EXTREMITY Right 10/09/2017   Procedure: ABDOMINAL AORTOGRAM W/LOWER EXTREMITY;  Surgeon: Waynetta Sandy, MD;  Location: [facility];  Service: Cardiovascular;  Laterality: Right;  . AMPUTATION Left 07/23/2017   Procedure: AMPUTATION  BELOW KNEE;  Surgeon: Waynetta Sandy, MD;  Location: [facility];  Service: Vascular;  Laterality: Left;  . AMPUTATION Left 08/12/2017   Procedure: REVISION BELOW KNEE;   Surgeon: Waynetta Sandy, MD;  Location: [facility];  Service: Vascular;  Laterality: Left;  . AMPUTATION Left 08/27/2017   Procedure: left ABOVE KNEE AMPUTATION;  Surgeon: Waynetta Sandy, MD;  Location: [facility];  Service: Vascular;  Laterality: Left;  . AMPUTATION Left 08/30/2017   Procedure: REVISION AMPUTATION ABOVE KNEE;  Surgeon: Serafina Mitchell, MD;  Location: [facility];  Service: Vascular;  Laterality: Left;  . AMPUTATION Right 11/04/2017   Procedure: AMPUTATION RIGHT FOURTH TOE;  Surgeon: Waynetta Sandy, MD;  Location: [facility];  Service: Vascular;  Laterality: Right;  . APPLICATION OF WOUND VAC Left 08/27/2017   Procedure: APPLICATION OF WOUND VAC;  Surgeon: Waynetta Sandy, MD;  Location: [facility];  Service: Vascular;  Laterality: Left;  . CARPAL TUNNEL RELEASE Right   . COLONOSCOPY    . LOWER EXTREMITY INTERVENTION Right 10/09/2017   Procedure: LOWER EXTREMITY INTERVENTION;  Surgeon: Waynetta Sandy, MD;  Location: [facility];  Service: Cardiovascular;  Laterality: Right;    There were no vitals filed for this visit.  Subjective Assessment - 09/02/18 1105    Subjective  No falls. She has been walking in apt. She tried toileting with prosthesis but she was falling left off of toilet.     Patient is accompained by:  Family member    Pertinent History  left TFA, right 4th toe amputation, DM2, PAD, HTN, right carpal tunnel release,  Limitations  Lifting;Standing;Walking;House hold activities    Patient Stated Goals  Walk with prosthesis in home & in community    Pain Score  3     Pain Location  Hand    Pain Orientation  Right   moved from finger up to lateral hand   Pain Descriptors / Indicators  Pins and needles;Sharp    Pain Type  Neuropathic pain;Chronic pain    Pain Onset  More than a month ago    Pain Frequency  Intermittent    Aggravating Factors   weightbearing on walker    Pain Relieving Factors  contrast bath    Multiple Pain  Sites  No                       [facility] Adult PT Treatment/Exercise - 09/02/18 1100      Transfers   Transfers  Sit to Stand;Stand to [employer]    Sit to Stand  5: Supervision;With upper extremity assist;With armrests;From chair/3-in-1;Other (comment)   to RW for stabilization, prosthetic knee not locked   Sit to Stand Details (indicate cue type and reason)  PT demo & instructed in technique with prosthetic knee unlocked    Stand to Sit  5: Supervision;With upper extremity assist;With armrests;To chair/3-in-1;Other (comment)   from RW, unlocks prosthetic knee with sitting   Stand to Sit Details  PT demo & instructed in technique with prosthetic knee unlocked    Stand Pivot Transfers  4: Min guard;5: Supervision;With armrests   with RW & unlocked prosthetic knee, 1 chair without armrests   Stand Pivot Transfer Details (indicate cue type and reason)  PT demo & instructed in technique with prosthetic knee unlocked      Ambulation/Gait   Ambulation/Gait  Yes    Ambulation/Gait Assistance  4: Min guard;5: Supervision   Min Guard as 1st time attempting w/out prosthetic knee lockc   Ambulation/Gait Assistance Details  No build up of right handle RW per pt request. She reported no difference with her pain / pins&needles.  Prosthetic knee unlocked: PT demo & instructed in stabilizing prosthetic knee with initial contact with heel and in stance. Pt able to return demo with verbal cues.     Ambulation Distance (Feet)  75 Feet   75' X 2   Assistive device  Rolling walker;Prosthesis   TFA unlocked knee prosthesis   Gait Pattern  Step-through pattern;Decreased step length - right;Decreased stance time - left;Left hip hike;Left circumduction;Antalgic;Trunk flexed;Abducted - left;Poor foot clearance - left    Ambulation Surface  Indoor;Level    Gait velocity  --    Ramp  --    Curb  --      Balance   Balance Assessed  --      Static Standing Balance   Static Standing  - Balance Support  --    Static Standing - Level of Assistance  --      Dynamic Standing Balance   Dynamic Standing - Balance Support  --    Dynamic Standing - Level of Assistance  --    Dynamic Standing - Balance Activities  --    Reaching for objects comments:  --    Head turns comments:  --    Head nods comments:  --      Prosthetics   Prosthetic Care Comments   Only stand & sit or pivot transfers with prosthetic knee unlocked.  If family uses belt for safety, she can walk with  knee unlocked only with their assist.     Current prosthetic wear tolerance (days/week)   daily    Current prosthetic wear tolerance (#hours/day)   ~75-87% of awake hours: donne upon arising, removes for mid-day nap, redonnes & removes for evening bath. Does not redonne so no prosthesis 2-4 hours after bath until bedtime.     Current prosthetic weight-bearing tolerance (hours/day)   Patient tolerates standing 5-10 minutes with no leg pain but right hand / little finger pain with weight bearing on RW.     Edema  --    Residual limb condition   intact with no issues.     Education Provided  Other (comment)   see prosthetic care comments   Person(s) Educated  Patient;Caregiver(s)    Education Method  Explanation;Verbal cues    Education Method  Verbalized understanding;Needs further instruction               PT Short Term Goals - 08/26/18 1906      PT SHORT TERM GOAL #1   Title  Patient verbalizes how to proper manage sweat with prosthesis. (All STGs Target Date: 09/30/2018)    Time  1    Period  Months    Status  New    Target Date  09/30/18      PT SHORT TERM GOAL #2   Title  Patient performs sit to/from stand and stand-pivot transfers with prosthetic knee unlocked safely with supervision.     Time  1    Period  Months    Status  New    Target Date  09/30/18      PT SHORT TERM GOAL #3   Title  Patient standing balance with intermittent UE support dynamic scanning & static 1 minute no UE  support with supervision.     Time  1    Period  Months    Status  New    Target Date  09/30/18      PT SHORT TERM GOAL #4   Title  Patient ambulates 180' with RW & prosthesis with supervision.     Time  1    Period  Months    Status  New    Target Date  09/30/18        PT Long Term Goals - 08/26/18 1900      PT LONG TERM GOAL #1   Title  Patient verbalizes & demonstrates understanding of prosthetic care & wears >80% of awake hours to enable safe use of prosthesis. (All LTGs Target Date: 11/21/2018)     Time  3    Period  Months    Status  New    Target Date  11/21/18      PT LONG TERM GOAL #2   Title  Berg Balance >36/56 to indicate lower fall risk.     Time  3    Period  Months    Status  New    Target Date  11/21/18      PT LONG TERM GOAL #3   Title  Patient ambulates 250' with RW & proshthesis modified independent to enable independent community mobility.     Time  3    Period  Weeks    Status  Revised    Target Date  11/21/18      PT LONG TERM GOAL #4   Title  Patient negotiates ramps & curbs with RW & prosthesis modified independent.     Time  3  Period  Months    Status  Revised    Target Date  11/21/18            Plan - 09/02/18 1200    Clinical Impression Statement  Today's skilled session focused on functioning with prosthetic knee unlocked. She was able to stabilize prosthetic knee with sit/stand, stand-pivot transfers and gait with verbal cues. She reports easier to advance prosthesis. She also did not want RW handle built up & no increase in hand issues even without use of manual prosthetic knee lock.     Rehab Potential  Good    Clinical Impairments Affecting Rehab Potential  pt unable to read / write and sister who is caregiver reports limited ability to read / write    PT Frequency  1x / week    PT Duration  12 weeks    PT Treatment/Interventions  ADLs/Self Care Home Management;DME Instruction;Gait training;Stair training;Functional mobility  training;Therapeutic activities;Therapeutic exercise;Balance training;Neuromuscular re-education;Patient/family education;Prosthetic Training;Passive range of motion;Vestibular;Canalith Repostioning    PT Next Visit Plan  continue prothetic gait, transfers & sit/stand with prosthetic knee unlocked working towards updated STGs    Consulted and Agree with Plan of Care  Patient;Family member/caregiver    Family Member Consulted  Nephew in law- Greg       Patient will benefit from skilled therapeutic intervention in order to improve the following deficits and impairments:  Abnormal gait, Decreased activity tolerance, Decreased balance, Decreased coordination, Decreased endurance, Decreased knowledge of use of DME, Decreased mobility, Decreased range of motion, Decreased scar mobility, Decreased strength, Impaired flexibility, Postural dysfunction, Obesity, Prosthetic Dependency  Visit Diagnosis: Muscle weakness (generalized)  Unsteadiness on feet  Other abnormalities of gait and mobility  Abnormal posture  Pain in right hand     Problem List Patient Active Problem List   Diagnosis Date Noted  . Pain in finger of right hand 02/20/2018  . Atherosclerosis of native arteries of the extremities with gangrene ([place]) 11/01/2017  . Unilateral AKA, left ([place])   . Post-operative pain   . OSA (obstructive sleep apnea)   . Benign essential HTN   . Diabetes mellitus type 2 in obese ([place])   . Hyperkalemia   . Leukocytosis   . Acute blood loss anemia   . Amputation stump infection ([place]) 08/23/2017  . Amputation of left lower extremity above knee upon examination ([place]) 08/23/2017  . PAD (peripheral artery disease) ([place]) 07/23/2017  . Cellulitis 06/10/2017  . Hyponatremia 06/10/2017  . Fever 06/10/2017  . Rash 05/24/2017  . Diabetes ([place])   . HTN (hypertension)   . Allergic rhinitis   . Insomnia   . Depression     Yandriel Boening PT, DPT 09/03/2018, 6:25 AM  [facility] [address] [place], [place], [address] Phone: [phone]   Fax:  [phone]  Name: Danielle Buchanan MRN: [id number] Date of Birth: 06-24-1956

## 2018-09-04 DIAGNOSIS — Z89619 Acquired absence of unspecified leg above knee: Secondary | ICD-10-CM | POA: Diagnosis not present

## 2018-09-04 DIAGNOSIS — T8189XA Other complications of procedures, not elsewhere classified, initial encounter: Secondary | ICD-10-CM | POA: Diagnosis not present

## 2018-09-04 DIAGNOSIS — R296 Repeated falls: Secondary | ICD-10-CM | POA: Diagnosis not present

## 2018-09-04 DIAGNOSIS — Z89612 Acquired absence of left leg above knee: Secondary | ICD-10-CM | POA: Diagnosis not present

## 2018-09-04 DIAGNOSIS — E1121 Type 2 diabetes mellitus with diabetic nephropathy: Secondary | ICD-10-CM | POA: Diagnosis not present

## 2018-09-04 DIAGNOSIS — S88012A Complete traumatic amputation at knee level, left lower leg, initial encounter: Secondary | ICD-10-CM | POA: Diagnosis not present

## 2018-09-09 ENCOUNTER — Ambulatory Visit: Payer: Medicare HMO | Attending: Family Medicine | Admitting: Physical Therapy

## 2018-09-09 ENCOUNTER — Encounter: Payer: Self-pay | Admitting: Physical Therapy

## 2018-09-09 DIAGNOSIS — R2689 Other abnormalities of gait and mobility: Secondary | ICD-10-CM | POA: Diagnosis not present

## 2018-09-09 DIAGNOSIS — R2681 Unsteadiness on feet: Secondary | ICD-10-CM | POA: Diagnosis not present

## 2018-09-09 DIAGNOSIS — R293 Abnormal posture: Secondary | ICD-10-CM

## 2018-09-09 DIAGNOSIS — M79641 Pain in right hand: Secondary | ICD-10-CM | POA: Diagnosis not present

## 2018-09-09 DIAGNOSIS — M6281 Muscle weakness (generalized): Secondary | ICD-10-CM | POA: Insufficient documentation

## 2018-09-10 ENCOUNTER — Ambulatory Visit: Payer: Medicare HMO | Admitting: Orthotics

## 2018-09-10 DIAGNOSIS — M79674 Pain in right toe(s): Principal | ICD-10-CM

## 2018-09-10 DIAGNOSIS — M79675 Pain in left toe(s): Principal | ICD-10-CM

## 2018-09-10 DIAGNOSIS — B351 Tinea unguium: Secondary | ICD-10-CM

## 2018-09-10 DIAGNOSIS — Z89421 Acquired absence of other right toe(s): Secondary | ICD-10-CM

## 2018-09-10 DIAGNOSIS — E1151 Type 2 diabetes mellitus with diabetic peripheral angiopathy without gangrene: Secondary | ICD-10-CM

## 2018-09-10 NOTE — Progress Notes (Signed)

## 2018-09-10 NOTE — Therapy (Signed)
[facility] [address] [place], [place], [address] Phone: [phone]   Fax:  [phone]  Physical Therapy Treatment  Patient Details  Name: Danielle Buchanan MRN: [id number] Date of Birth: [birth year] Referring Provider (PT): Maurice Small, MD   Encounter Date: 09/09/2018  PT End of Session - 09/09/18 1800    Visit Number  33    Number of Visits  44    Date for PT Re-Evaluation  11/21/18    Authorization Type  [employer]    Authorization Time Period  $3400 OOP max has been met prior to PT evaluation. Visit limit Medicare guidelines    PT Start Time  7106    PT Stop Time  1100    PT Time Calculation (min)  45 min    Equipment Utilized During Treatment  Gait belt    Activity Tolerance  Patient tolerated treatment well;Patient limited by pain   limited by hand pain with increased pressure, rest breaks helped   Behavior During Therapy  WFL for tasks assessed/performed       Past Medical History:  Diagnosis Date  . Allergic rhinitis   . Depression   . Diabetes ([place])   . Dyslipidemia   . HTN (hypertension)   . Insomnia   . OSA (obstructive sleep apnea)    no cpap  . Urinary incontinence     Past Surgical History:  Procedure Laterality Date  . ABDOMINAL AORTOGRAM W/LOWER EXTREMITY N/A 06/24/2017   Procedure: ABDOMINAL AORTOGRAM W/LOWER EXTREMITY;  Surgeon: Waynetta Sandy, MD;  Location: [facility];  Service: Cardiovascular;  Laterality: N/A;  . ABDOMINAL AORTOGRAM W/LOWER EXTREMITY Right 10/09/2017   Procedure: ABDOMINAL AORTOGRAM W/LOWER EXTREMITY;  Surgeon: Waynetta Sandy, MD;  Location: [facility];  Service: Cardiovascular;  Laterality: Right;  . AMPUTATION Left 07/23/2017   Procedure: AMPUTATION  BELOW KNEE;  Surgeon: Waynetta Sandy, MD;  Location: [facility];  Service: Vascular;  Laterality: Left;  . AMPUTATION Left 08/12/2017   Procedure: REVISION BELOW KNEE;   Surgeon: Waynetta Sandy, MD;  Location: [facility];  Service: Vascular;  Laterality: Left;  . AMPUTATION Left 08/27/2017   Procedure: left ABOVE KNEE AMPUTATION;  Surgeon: Waynetta Sandy, MD;  Location: [facility];  Service: Vascular;  Laterality: Left;  . AMPUTATION Left 08/30/2017   Procedure: REVISION AMPUTATION ABOVE KNEE;  Surgeon: Serafina Mitchell, MD;  Location: [facility];  Service: Vascular;  Laterality: Left;  . AMPUTATION Right 11/04/2017   Procedure: AMPUTATION RIGHT FOURTH TOE;  Surgeon: Waynetta Sandy, MD;  Location: [facility];  Service: Vascular;  Laterality: Right;  . APPLICATION OF WOUND VAC Left 08/27/2017   Procedure: APPLICATION OF WOUND VAC;  Surgeon: Waynetta Sandy, MD;  Location: [facility];  Service: Vascular;  Laterality: Left;  . CARPAL TUNNEL RELEASE Right   . COLONOSCOPY    . LOWER EXTREMITY INTERVENTION Right 10/09/2017   Procedure: LOWER EXTREMITY INTERVENTION;  Surgeon: Waynetta Sandy, MD;  Location: [facility];  Service: Cardiovascular;  Laterality: Right;    There were no vitals filed for this visit.  Subjective Assessment - 09/09/18 1019    Subjective  She has transferred with prosthetic knee unlocked without issues and walked with nephew/neice without it locked. No response on order for [facility] yet.     Patient is accompained by:  Family member    Pertinent History  left TFA, right 4th toe amputation, DM2, PAD, HTN, right carpal  tunnel release,    Limitations  Lifting;Standing;Walking;House hold activities    Patient Stated Goals  Walk with prosthesis in home & in community    Currently in Pain?  Yes    Pain Score  3     Pain Location  Finger (Comment which one)    Pain Orientation  Right   little finger only   Pain Descriptors / Indicators  Pins and needles;Sharp    Pain Type  Neuropathic pain;Chronic pain    Pain Onset  More than a month ago    Pain Frequency  Intermittent    Aggravating Factors   weightbearing on  walker    Pain Relieving Factors  contrast bath                       [facility] Adult PT Treatment/Exercise - 09/09/18 1015      Transfers   Transfers  Sit to Stand;Stand to [employer] Transfers    Sit to Stand  5: Supervision;With upper extremity assist;With armrests;From chair/3-in-1;Other (comment)   to RW for stabilization, prosthetic knee not locked   Stand to Sit  5: Supervision;With upper extremity assist;With armrests;To chair/3-in-1;Other (comment)   from RW, unlocks prosthetic knee with sitting   Stand Pivot Transfers  5: Supervision;With armrests   with RW & unlocked prosthetic knee, 1 chair without armrests     Ambulation/Gait   Ambulation/Gait  Yes    Ambulation/Gait Assistance  4: Min guard;5: Supervision   Min Guard as 1st time attempting w/out prosthetic knee lockc   Ambulation/Gait Assistance Details  prosthetic knee made snapping noise at terminal swing/initial contact. Her prosthetic knee is posteriorly offset for improved stability. This may be causing mechanical prosthetic knee improper forces on knee that are now showing up with knee unlocked. PT contacted her new prosthetist, Dion Body, CP and she is going to see pt in her office Friday, 12/6. PT instructed pt & nephew to only use prosthesis locked knee and if prosthetist moves knee forward then continue with locked knee until next PT appt.     Ambulation Distance (Feet)  100 Feet   100' unlocked with onset of noise & 50' locked no noise   Assistive device  Rolling walker;Prosthesis   TFA unlocked knee prosthesis   Gait Pattern  Step-through pattern;Decreased step length - right;Decreased stance time - left;Left hip hike;Left circumduction;Antalgic;Trunk flexed;Abducted - left;Poor foot clearance - left    Ambulation Surface  Indoor;Level      Prosthetics   Prosthetic Care Comments   see gait comments section above for new prosthetic issue    Current prosthetic wear tolerance (days/week)    daily    Current prosthetic wear tolerance (#hours/day)   ~75-87% of awake hours: donne upon arising, removes for mid-day nap, redonnes & removes for evening bath. Does not redonne so no prosthesis 2-4 hours after bath until bedtime.     Current prosthetic weight-bearing tolerance (hours/day)   Patient tolerates standing 5-10 minutes with no leg pain but right hand / little finger pain with weight bearing on RW.     Residual limb condition   intact with no issues.     Education Provided  Other (comment)   see prosthetic care comments   Person(s) Educated  Patient;Caregiver(s)    Education Method  Explanation;Verbal cues    Education Method  Verbalized understanding;Needs further instruction               PT Short Term Goals -  08/26/18 1906      PT SHORT TERM GOAL #1   Title  Patient verbalizes how to proper manage sweat with prosthesis. (All STGs Target Date: 09/30/2018)    Time  1    Period  Months    Status  New    Target Date  09/30/18      PT SHORT TERM GOAL #2   Title  Patient performs sit to/from stand and stand-pivot transfers with prosthetic knee unlocked safely with supervision.     Time  1    Period  Months    Status  New    Target Date  09/30/18      PT SHORT TERM GOAL #3   Title  Patient standing balance with intermittent UE support dynamic scanning & static 1 minute no UE support with supervision.     Time  1    Period  Months    Status  New    Target Date  09/30/18      PT SHORT TERM GOAL #4   Title  Patient ambulates 180' with RW & prosthesis with supervision.     Time  1    Period  Months    Status  New    Target Date  09/30/18        PT Long Term Goals - 08/26/18 1900      PT LONG TERM GOAL #1   Title  Patient verbalizes & demonstrates understanding of prosthetic care & wears >80% of awake hours to enable safe use of prosthesis. (All LTGs Target Date: 11/21/2018)     Time  3    Period  Months    Status  New    Target Date  11/21/18      PT  LONG TERM GOAL #2   Title  Berg Balance >36/56 to indicate lower fall risk.     Time  3    Period  Months    Status  New    Target Date  11/21/18      PT LONG TERM GOAL #3   Title  Patient ambulates 250' with RW & proshthesis modified independent to enable independent community mobility.     Time  3    Period  Weeks    Status  Revised    Target Date  11/21/18      PT LONG TERM GOAL #4   Title  Patient negotiates ramps & curbs with RW & prosthesis modified independent.     Time  3    Period  Months    Status  Revised    Target Date  11/21/18            Plan - 09/09/18 1800    Clinical Impression Statement  Patient's prosthesis had onset of loud snapping noise at terminal swing / initial contact that may be mechanical prosthetic issue with her posterior offset knee and unlocked. She is seeing her new prosthetist 12/6 to address.     Rehab Potential  Good    Clinical Impairments Affecting Rehab Potential  pt unable to read / write and sister who is caregiver reports limited ability to read / write    PT Frequency  1x / week    PT Duration  12 weeks    PT Treatment/Interventions  ADLs/Self Care Home Management;DME Instruction;Gait training;Stair training;Functional mobility training;Therapeutic activities;Therapeutic exercise;Balance training;Neuromuscular re-education;Patient/family education;Prosthetic Training;Passive range of motion;Vestibular;Canalith Repostioning    PT Next Visit Plan  check what prosthetist found with knee issue,  continue prothetic gait, transfers & sit/stand with prosthetic knee unlocked working towards updated STGs    Consulted and Agree with Plan of Care  Patient;Family member/caregiver    Family Member Consulted  Nephew in law- Greg       Patient will benefit from skilled therapeutic intervention in order to improve the following deficits and impairments:  Abnormal gait, Decreased activity tolerance, Decreased balance, Decreased coordination, Decreased  endurance, Decreased knowledge of use of DME, Decreased mobility, Decreased range of motion, Decreased scar mobility, Decreased strength, Impaired flexibility, Postural dysfunction, Obesity, Prosthetic Dependency  Visit Diagnosis: Muscle weakness (generalized)  Unsteadiness on feet  Other abnormalities of gait and mobility  Abnormal posture  Pain in right hand     Problem List Patient Active Problem List   Diagnosis Date Noted  . Pain in finger of right hand 02/20/2018  . Atherosclerosis of native arteries of the extremities with gangrene ([place]) 11/01/2017  . Unilateral AKA, left ([place])   . Post-operative pain   . OSA (obstructive sleep apnea)   . Benign essential HTN   . Diabetes mellitus type 2 in obese ([place])   . Hyperkalemia   . Leukocytosis   . Acute blood loss anemia   . Amputation stump infection ([place]) 08/23/2017  . Amputation of left lower extremity above knee upon examination ([place]) 08/23/2017  . PAD (peripheral artery disease) ([place]) 07/23/2017  . Cellulitis 06/10/2017  . Hyponatremia 06/10/2017  . Fever 06/10/2017  . Rash 05/24/2017  . Diabetes ([place])   . HTN (hypertension)   . Allergic rhinitis   . Insomnia   . Depression     Leota Maka PT, DPT 09/10/2018, 8:40 AM  [facility] [address] [place], [place], [address] Phone: [phone]   Fax:  [phone]  Name: Danielle Buchanan MRN: [id number] Date of Birth: [birth year]

## 2018-09-16 ENCOUNTER — Ambulatory Visit: Payer: Medicare HMO | Admitting: Physical Therapy

## 2018-09-16 ENCOUNTER — Encounter: Payer: Self-pay | Admitting: Physical Therapy

## 2018-09-16 DIAGNOSIS — M6281 Muscle weakness (generalized): Secondary | ICD-10-CM

## 2018-09-16 DIAGNOSIS — R2681 Unsteadiness on feet: Secondary | ICD-10-CM

## 2018-09-16 DIAGNOSIS — R293 Abnormal posture: Secondary | ICD-10-CM

## 2018-09-16 DIAGNOSIS — R2689 Other abnormalities of gait and mobility: Secondary | ICD-10-CM

## 2018-09-16 DIAGNOSIS — M79641 Pain in right hand: Secondary | ICD-10-CM | POA: Diagnosis not present

## 2018-09-17 NOTE — Therapy (Signed)
[facility] [address] [place], [place], [address] Phone: [phone]   Fax:  [phone]  Physical Therapy Treatment  Patient Details  Name: Danielle Buchanan MRN: [id number] Date of Birth: 10/30/1955 Referring Provider (PT): Maurice Small, MD   Encounter Date: 09/16/2018  PT End of Session - 09/16/18 1800    Visit Number  34    Number of Visits  44    Date for PT Re-Evaluation  11/21/18    Authorization Type  [employer]    Authorization Time Period  $3400 OOP max has been met prior to PT evaluation. Visit limit Medicare guidelines    PT Start Time  1320    PT Stop Time  1400    PT Time Calculation (min)  40 min    Equipment Utilized During Treatment  Gait belt    Activity Tolerance  Patient tolerated treatment well;Patient limited by pain   limited by hand pain with increased pressure, rest breaks helped   Behavior During Therapy  WFL for tasks assessed/performed       Past Medical History:  Diagnosis Date  . Allergic rhinitis   . Depression   . Diabetes ([place])   . Dyslipidemia   . HTN (hypertension)   . Insomnia   . OSA (obstructive sleep apnea)    no cpap  . Urinary incontinence     Past Surgical History:  Procedure Laterality Date  . ABDOMINAL AORTOGRAM W/LOWER EXTREMITY N/A 06/24/2017   Procedure: ABDOMINAL AORTOGRAM W/LOWER EXTREMITY;  Surgeon: Waynetta Sandy, MD;  Location: [facility];  Service: Cardiovascular;  Laterality: N/A;  . ABDOMINAL AORTOGRAM W/LOWER EXTREMITY Right 10/09/2017   Procedure: ABDOMINAL AORTOGRAM W/LOWER EXTREMITY;  Surgeon: Waynetta Sandy, MD;  Location: [facility];  Service: Cardiovascular;  Laterality: Right;  . AMPUTATION Left 07/23/2017   Procedure: AMPUTATION  BELOW KNEE;  Surgeon: Waynetta Sandy, MD;  Location: [facility];  Service: Vascular;  Laterality: Left;  . AMPUTATION Left 08/12/2017   Procedure: REVISION BELOW KNEE;   Surgeon: Waynetta Sandy, MD;  Location: [facility];  Service: Vascular;  Laterality: Left;  . AMPUTATION Left 08/27/2017   Procedure: left ABOVE KNEE AMPUTATION;  Surgeon: Waynetta Sandy, MD;  Location: [facility];  Service: Vascular;  Laterality: Left;  . AMPUTATION Left 08/30/2017   Procedure: REVISION AMPUTATION ABOVE KNEE;  Surgeon: Serafina Mitchell, MD;  Location: [facility];  Service: Vascular;  Laterality: Left;  . AMPUTATION Right 11/04/2017   Procedure: AMPUTATION RIGHT FOURTH TOE;  Surgeon: Waynetta Sandy, MD;  Location: [facility];  Service: Vascular;  Laterality: Right;  . APPLICATION OF WOUND VAC Left 08/27/2017   Procedure: APPLICATION OF WOUND VAC;  Surgeon: Waynetta Sandy, MD;  Location: [facility];  Service: Vascular;  Laterality: Left;  . CARPAL TUNNEL RELEASE Right   . COLONOSCOPY    . LOWER EXTREMITY INTERVENTION Right 10/09/2017   Procedure: LOWER EXTREMITY INTERVENTION;  Surgeon: Waynetta Sandy, MD;  Location: [facility];  Service: Cardiovascular;  Laterality: Right;    There were no vitals filed for this visit.  Subjective Assessment - 09/16/18 1320    Subjective  She saw prosthetist who felt prosthesis was structurally sound. She went shopping but did not attempt to drive shopping cart yet.     Patient is accompained by:  Family member    Pertinent History  left TFA, right 4th toe amputation, DM2, PAD, HTN, right carpal tunnel release,  Limitations  Lifting;Standing;Walking;House hold activities    Patient Stated Goals  Walk with prosthesis in home & in community    Currently in Pain?  Yes    Pain Score  7     Pain Location  Finger (Comment which one)    Pain Orientation  Right   little finger   Pain Descriptors / Indicators  Pins and needles;Sharp    Pain Onset  More than a month ago    Pain Frequency  Intermittent    Aggravating Factors   weightbearing on walker    Pain Relieving Factors  contrast bath                        [facility] Adult PT Treatment/Exercise - 09/16/18 1320      Transfers   Transfers  Sit to Stand;Stand to [employer] Transfers    Sit to Stand  5: Supervision;With upper extremity assist;With armrests;From chair/3-in-1;Other (comment)   to RW for stabilization, prosthetic knee not locked   Stand to Sit  5: Supervision;With upper extremity assist;With armrests;To chair/3-in-1;Other (comment)   from RW, unlocks prosthetic knee with sitting   Stand Pivot Transfers  5: Supervision;With armrests   with RW & unlocked prosthetic knee, 1 chair without armrests     Ambulation/Gait   Ambulation/Gait  Yes    Ambulation/Gait Assistance  5: Supervision    Ambulation/Gait Assistance Details  prosthetic knee unlocked: verbal cues on initial contact with heel to extend knee & hip extension in stance to stablize prosthetic knee;  prosthesis made loud popping noise still ~5 times during session (3 times when locked jumped into lock position & 2 did not seem related to the lock which is what prosthetist felt was making noise)    Ambulation Distance (Feet)  150 Feet   limited by right hand/finger pain   Assistive device  Rolling walker;Prosthesis   TFA unlocked knee prosthesis   Gait Pattern  Step-through pattern;Decreased step length - right;Decreased stance time - left;Left hip hike;Left circumduction;Antalgic;Trunk flexed;Abducted - left;Poor foot clearance - left    Ambulation Surface  Indoor;Level    Stairs  Yes    Stairs Assistance  5: Supervision    Stairs Assistance Details (indicate cue type and reason)  Prosthetic knee unlocked: demo & verbal cues on stabilizing prosthetic knee with stance.    Stair Management Technique  Two rails;Step to pattern;Forwards    Number of Stairs  4    Ramp  4: Min assist;5: Supervision   RW & unlocked TFA prosthesis   Ramp Details (indicate cue type and reason)  Prosthetic knee unlocked: demo & verbal cues on stabilizing prosthetic  knee with stance.    Curb  4: Min assist   RW & unlocked prosthetic knee   Curb Details (indicate cue type and reason)  Prosthetic knee unlocked: demo & verbal cues on stabilizing prosthetic knee with stance.      Prosthetics   Prosthetic Care Comments   --    Current prosthetic wear tolerance (days/week)   daily    Current prosthetic wear tolerance (#hours/day)   most of awake hours    Current prosthetic weight-bearing tolerance (hours/day)   Patient tolerates standing 5-10 minutes with no leg pain but right hand / little finger pain with weight bearing on RW.     Residual limb condition   intact with no issues.     Education Provided  --    Person(s) Educated  Patient;Caregiver(s)  Education Method  Explanation    Education Method  Verbalized understanding    Donning Prosthesis  Supervision;Modified independent (device/increased time)    Doffing Prosthesis  Modified independent (device/increased time)             PT Education - 09/16/18 1400    Education Details  try Isotoner glove on right hand especially during cold days that seems to increase her pain    Person(s) Educated  Patient;Caregiver(s)    Methods  Explanation;Demonstration;Verbal cues    Comprehension  Verbalized understanding       PT Short Term Goals - 08/26/18 1906      PT SHORT TERM GOAL #1   Title  Patient verbalizes how to proper manage sweat with prosthesis. (All STGs Target Date: 09/30/2018)    Time  1    Period  Months    Status  New    Target Date  09/30/18      PT SHORT TERM GOAL #2   Title  Patient performs sit to/from stand and stand-pivot transfers with prosthetic knee unlocked safely with supervision.     Time  1    Period  Months    Status  New    Target Date  09/30/18      PT SHORT TERM GOAL #3   Title  Patient standing balance with intermittent UE support dynamic scanning & static 1 minute no UE support with supervision.     Time  1    Period  Months    Status  New    Target  Date  09/30/18      PT SHORT TERM GOAL #4   Title  Patient ambulates 180' with RW & prosthesis with supervision.     Time  1    Period  Months    Status  New    Target Date  09/30/18        PT Long Term Goals - 08/26/18 1900      PT LONG TERM GOAL #1   Title  Patient verbalizes & demonstrates understanding of prosthetic care & wears >80% of awake hours to enable safe use of prosthesis. (All LTGs Target Date: 11/21/2018)     Time  3    Period  Months    Status  New    Target Date  11/21/18      PT LONG TERM GOAL #2   Title  Berg Balance >36/56 to indicate lower fall risk.     Time  3    Period  Months    Status  New    Target Date  11/21/18      PT LONG TERM GOAL #3   Title  Patient ambulates 250' with RW & proshthesis modified independent to enable independent community mobility.     Time  3    Period  Weeks    Status  Revised    Target Date  11/21/18      PT LONG TERM GOAL #4   Title  Patient negotiates ramps & curbs with RW & prosthesis modified independent.     Time  3    Period  Months    Status  Revised    Target Date  11/21/18            Plan - 09/16/18 1800    Clinical Impression Statement  Patient improved her ability to ambulate & negotiate ramps & curbs with prosthetic knee unlocked. PT recommended only using prosthesis with knee unlocked with family  are available to supervise for safety for now. Pt verbalized understanding.     Rehab Potential  Good    Clinical Impairments Affecting Rehab Potential  pt unable to read / write and sister who is caregiver reports limited ability to read / write    PT Frequency  1x / week    PT Duration  12 weeks    PT Treatment/Interventions  ADLs/Self Care Home Management;DME Instruction;Gait training;Stair training;Functional mobility training;Therapeutic activities;Therapeutic exercise;Balance training;Neuromuscular re-education;Patient/family education;Prosthetic Training;Passive range of motion;Vestibular;Canalith  Repostioning    PT Next Visit Plan  continue prothetic gait, transfers & sit/stand with prosthetic knee unlocked working towards updated STGs    Consulted and Agree with Plan of Care  Patient;Family member/caregiver    Family Member Consulted  Nephew in law- Greg       Patient will benefit from skilled therapeutic intervention in order to improve the following deficits and impairments:  Abnormal gait, Decreased activity tolerance, Decreased balance, Decreased coordination, Decreased endurance, Decreased knowledge of use of DME, Decreased mobility, Decreased range of motion, Decreased scar mobility, Decreased strength, Impaired flexibility, Postural dysfunction, Obesity, Prosthetic Dependency  Visit Diagnosis: Muscle weakness (generalized)  Unsteadiness on feet  Other abnormalities of gait and mobility  Abnormal posture  Pain in right hand     Problem List Patient Active Problem List   Diagnosis Date Noted  . Pain in finger of right hand 02/20/2018  . Atherosclerosis of native arteries of the extremities with gangrene ([place]) 11/01/2017  . Unilateral AKA, left ([place])   . Post-operative pain   . OSA (obstructive sleep apnea)   . Benign essential HTN   . Diabetes mellitus type 2 in obese ([place])   . Hyperkalemia   . Leukocytosis   . Acute blood loss anemia   . Amputation stump infection ([place]) 08/23/2017  . Amputation of left lower extremity above knee upon examination ([place]) 08/23/2017  . PAD (peripheral artery disease) ([place]) 07/23/2017  . Cellulitis 06/10/2017  . Hyponatremia 06/10/2017  . Fever 06/10/2017  . Rash 05/24/2017  . Diabetes ([place])   . HTN (hypertension)   . Allergic rhinitis   . Insomnia   . Depression     , PT, DPT 09/17/2018, 12:13 PM  [facility] [address] [place], [place], [address] Phone: [phone]   Fax:  [phone]  Name: Danielle Buchanan MRN: [id number] Date of Birth:  01-15-1956

## 2018-09-23 ENCOUNTER — Encounter: Payer: Self-pay | Admitting: Physical Therapy

## 2018-09-23 ENCOUNTER — Ambulatory Visit: Payer: Medicare HMO | Admitting: Physical Therapy

## 2018-09-23 DIAGNOSIS — R293 Abnormal posture: Secondary | ICD-10-CM

## 2018-09-23 DIAGNOSIS — M6281 Muscle weakness (generalized): Secondary | ICD-10-CM | POA: Diagnosis not present

## 2018-09-23 DIAGNOSIS — R2689 Other abnormalities of gait and mobility: Secondary | ICD-10-CM | POA: Diagnosis not present

## 2018-09-23 DIAGNOSIS — R2681 Unsteadiness on feet: Secondary | ICD-10-CM

## 2018-09-23 DIAGNOSIS — M79641 Pain in right hand: Secondary | ICD-10-CM | POA: Diagnosis not present

## 2018-09-24 DIAGNOSIS — I10 Essential (primary) hypertension: Secondary | ICD-10-CM | POA: Diagnosis not present

## 2018-09-24 DIAGNOSIS — E1121 Type 2 diabetes mellitus with diabetic nephropathy: Secondary | ICD-10-CM | POA: Diagnosis not present

## 2018-09-24 DIAGNOSIS — E782 Mixed hyperlipidemia: Secondary | ICD-10-CM | POA: Diagnosis not present

## 2018-09-24 DIAGNOSIS — Z0000 Encounter for general adult medical examination without abnormal findings: Secondary | ICD-10-CM | POA: Diagnosis not present

## 2018-09-24 NOTE — Therapy (Signed)
[facility] [address] [place], [place], [address] Phone: [phone]   Fax:  [phone]  Physical Therapy Treatment  Patient Details  Name: Danielle Buchanan MRN: [id number] Date of Birth: 10-Jun-1956 Referring Provider (PT): Maurice Small, MD   Encounter Date: 09/23/2018  PT End of Session - 09/23/18 1800    Visit Number  35    Number of Visits  44    Date for PT Re-Evaluation  11/21/18    Authorization Type  [employer]    Authorization Time Period  $3400 OOP max has been met prior to PT evaluation. Visit limit Medicare guidelines    PT Start Time  1100    PT Stop Time  1145    PT Time Calculation (min)  45 min    Equipment Utilized During Treatment  Gait belt    Activity Tolerance  Patient tolerated treatment well;Patient limited by pain   limited by hand pain with increased pressure, rest breaks helped   Behavior During Therapy  WFL for tasks assessed/performed       Past Medical History:  Diagnosis Date  . Allergic rhinitis   . Depression   . Diabetes ([place])   . Dyslipidemia   . HTN (hypertension)   . Insomnia   . OSA (obstructive sleep apnea)    no cpap  . Urinary incontinence     Past Surgical History:  Procedure Laterality Date  . ABDOMINAL AORTOGRAM W/LOWER EXTREMITY N/A 06/24/2017   Procedure: ABDOMINAL AORTOGRAM W/LOWER EXTREMITY;  Surgeon: Waynetta Sandy, MD;  Location: [facility];  Service: Cardiovascular;  Laterality: N/A;  . ABDOMINAL AORTOGRAM W/LOWER EXTREMITY Right 10/09/2017   Procedure: ABDOMINAL AORTOGRAM W/LOWER EXTREMITY;  Surgeon: Waynetta Sandy, MD;  Location: [facility];  Service: Cardiovascular;  Laterality: Right;  . AMPUTATION Left 07/23/2017   Procedure: AMPUTATION  BELOW KNEE;  Surgeon: Waynetta Sandy, MD;  Location: [facility];  Service: Vascular;  Laterality: Left;  . AMPUTATION Left 08/12/2017   Procedure: REVISION BELOW KNEE;   Surgeon: Waynetta Sandy, MD;  Location: [facility];  Service: Vascular;  Laterality: Left;  . AMPUTATION Left 08/27/2017   Procedure: left ABOVE KNEE AMPUTATION;  Surgeon: Waynetta Sandy, MD;  Location: [facility];  Service: Vascular;  Laterality: Left;  . AMPUTATION Left 08/30/2017   Procedure: REVISION AMPUTATION ABOVE KNEE;  Surgeon: Serafina Mitchell, MD;  Location: [facility];  Service: Vascular;  Laterality: Left;  . AMPUTATION Right 11/04/2017   Procedure: AMPUTATION RIGHT FOURTH TOE;  Surgeon: Waynetta Sandy, MD;  Location: [facility];  Service: Vascular;  Laterality: Right;  . APPLICATION OF WOUND VAC Left 08/27/2017   Procedure: APPLICATION OF WOUND VAC;  Surgeon: Waynetta Sandy, MD;  Location: [facility];  Service: Vascular;  Laterality: Left;  . CARPAL TUNNEL RELEASE Right   . COLONOSCOPY    . LOWER EXTREMITY INTERVENTION Right 10/09/2017   Procedure: LOWER EXTREMITY INTERVENTION;  Surgeon: Waynetta Sandy, MD;  Location: [facility];  Service: Cardiovascular;  Laterality: Right;    There were no vitals filed for this visit.  Subjective Assessment - 09/23/18 1100    Subjective  She has walked with knee unlocked with Andres Labrum without any issues.     Patient is accompained by:  Family member    Pertinent History  left TFA, right 4th toe amputation, DM2, PAD, HTN, right carpal tunnel release,    Limitations  Lifting;Standing;Walking;House hold activities  Patient Stated Goals  Walk with prosthesis in home & in community    Currently in Pain?  Yes    Pain Score  7     Pain Location  Finger (Comment which one)   lateral hand wrist to finger   Pain Orientation  Right    Pain Descriptors / Indicators  Pins and needles    Pain Type  Neuropathic pain;Chronic pain    Pain Onset  More than a month ago    Pain Frequency  Intermittent    Aggravating Factors   weightbearing on walker    Pain Relieving Factors  contrasat bath                        [facility] Adult PT Treatment/Exercise - 09/23/18 1100      Transfers   Transfers  Sit to Stand;Stand to [employer] Transfers    Sit to Stand  5: Supervision;With upper extremity assist;With armrests;From chair/3-in-1;Other (comment)   to RW for stabilization, prosthetic knee not locked   Stand to Sit  5: Supervision;With upper extremity assist;With armrests;To chair/3-in-1;Other (comment)   from RW, unlocks prosthetic knee with sitting   Stand Pivot Transfers  5: Supervision;With armrests   with RW & unlocked prosthetic knee, 1 chair without armrests     Ambulation/Gait   Ambulation/Gait  Yes    Ambulation/Gait Assistance  5: Supervision    Ambulation/Gait Assistance Details  verbal cues on prosthetic knee control with initial contact & stance    Ambulation Distance (Feet)  80 Feet   80' X 2 limited by right hand/finger pain   Assistive device  Rolling walker;Prosthesis   TFA unlocked knee prosthesis   Gait Pattern  Step-through pattern;Decreased step length - right;Decreased stance time - left;Left hip hike;Left circumduction;Antalgic;Trunk flexed;Abducted - left;Poor foot clearance - left    Ambulation Surface  Indoor;Level    Stairs  --    Stairs Assistance  --    Stair Management Technique  --    Number of Stairs  --    Ramp  --    Curb  --      Prosthetics   Prosthetic Care Comments   PT discussed new liners & socket revision with pt & nephew. Pt has appt with PCP tomorrow with PT recommendation to request [facility] & socket revision prescriptions. PT spoke with prosthetist who is working on requesting authorization.    Current prosthetic wear tolerance (days/week)   daily    Current prosthetic wear tolerance (#hours/day)   most of awake hours    Current prosthetic weight-bearing tolerance (hours/day)   Patient tolerates standing 5-10 minutes with no leg pain but right hand / little finger pain with weight bearing on RW.     Residual limb  condition   intact with no issues.     Education Provided  Other (comment)   see prosthetic care comments   Person(s) Educated  Patient;Caregiver(s)    Education Method  Explanation;Verbal cues    Education Method  Verbalized understanding;Verbal cues required;Needs further instruction               PT Short Term Goals - 08/26/18 1906      PT SHORT TERM GOAL #1   Title  Patient verbalizes how to proper manage sweat with prosthesis. (All STGs Target Date: 09/30/2018)    Time  1    Period  Months    Status  New    Target Date  09/30/18  PT SHORT TERM GOAL #2   Title  Patient performs sit to/from stand and stand-pivot transfers with prosthetic knee unlocked safely with supervision.     Time  1    Period  Months    Status  New    Target Date  09/30/18      PT SHORT TERM GOAL #3   Title  Patient standing balance with intermittent UE support dynamic scanning & static 1 minute no UE support with supervision.     Time  1    Period  Months    Status  New    Target Date  09/30/18      PT SHORT TERM GOAL #4   Title  Patient ambulates 180' with RW & prosthesis with supervision.     Time  1    Period  Months    Status  New    Target Date  09/30/18        PT Long Term Goals - 08/26/18 1900      PT LONG TERM GOAL #1   Title  Patient verbalizes & demonstrates understanding of prosthetic care & wears >80% of awake hours to enable safe use of prosthesis. (All LTGs Target Date: 11/21/2018)     Time  3    Period  Months    Status  New    Target Date  11/21/18      PT LONG TERM GOAL #2   Title  Berg Balance >36/56 to indicate lower fall risk.     Time  3    Period  Months    Status  New    Target Date  11/21/18      PT LONG TERM GOAL #3   Title  Patient ambulates 250' with RW & proshthesis modified independent to enable independent community mobility.     Time  3    Period  Weeks    Status  Revised    Target Date  11/21/18      PT LONG TERM GOAL #4   Title   Patient negotiates ramps & curbs with RW & prosthesis modified independent.     Time  3    Period  Months    Status  Revised    Target Date  11/21/18            Plan - 09/23/18 1801    Clinical Impression Statement  Patient appears would benefit from socket revision in addition to new liners. She has improved prosthetic gait with unlocked prosthetic knee and safe to ambulate short distances like her apt when she is not fatigued.    Rehab Potential  Good    Clinical Impairments Affecting Rehab Potential  pt unable to read / write and sister who is caregiver reports limited ability to read / write    PT Frequency  1x / week    PT Duration  12 weeks    PT Treatment/Interventions  ADLs/Self Care Home Management;DME Instruction;Gait training;Stair training;Functional mobility training;Therapeutic activities;Therapeutic exercise;Balance training;Neuromuscular re-education;Patient/family education;Prosthetic Training;Passive range of motion;Vestibular;Canalith Repostioning    PT Next Visit Plan  continue prothetic gait, transfers & sit/stand with prosthetic knee unlocked,  check STGs    Consulted and Agree with Plan of Care  Patient;Family member/caregiver    Family Member Consulted  Nephew in law- Greg       Patient will benefit from skilled therapeutic intervention in order to improve the following deficits and impairments:  Abnormal gait, Decreased activity tolerance, Decreased balance, Decreased coordination,  Decreased endurance, Decreased knowledge of use of DME, Decreased mobility, Decreased range of motion, Decreased scar mobility, Decreased strength, Impaired flexibility, Postural dysfunction, Obesity, Prosthetic Dependency  Visit Diagnosis: Muscle weakness (generalized)  Unsteadiness on feet  Other abnormalities of gait and mobility  Abnormal posture     Problem List Patient Active Problem List   Diagnosis Date Noted  . Pain in finger of right hand 02/20/2018  .  Atherosclerosis of native arteries of the extremities with gangrene ([place]) 11/01/2017  . Unilateral AKA, left ([place])   . Post-operative pain   . OSA (obstructive sleep apnea)   . Benign essential HTN   . Diabetes mellitus type 2 in obese ([place])   . Hyperkalemia   . Leukocytosis   . Acute blood loss anemia   . Amputation stump infection ([place]) 08/23/2017  . Amputation of left lower extremity above knee upon examination ([place]) 08/23/2017  . PAD (peripheral artery disease) ([place]) 07/23/2017  . Cellulitis 06/10/2017  . Hyponatremia 06/10/2017  . Fever 06/10/2017  . Rash 05/24/2017  . Diabetes ([place])   . HTN (hypertension)   . Allergic rhinitis   . Insomnia   . Depression     Hinata Diener PT, DPT 09/24/2018, 6:04 PM  [facility] [address] [place], [place], [address] Phone: [phone]   Fax:  [phone]  Name: Danielle Buchanan MRN: [id number] Date of Birth: 01-16-1956

## 2018-09-30 ENCOUNTER — Encounter: Payer: Self-pay | Admitting: Physical Therapy

## 2018-09-30 ENCOUNTER — Ambulatory Visit: Payer: Medicare HMO | Admitting: Physical Therapy

## 2018-09-30 DIAGNOSIS — M6281 Muscle weakness (generalized): Secondary | ICD-10-CM | POA: Diagnosis not present

## 2018-09-30 DIAGNOSIS — R293 Abnormal posture: Secondary | ICD-10-CM

## 2018-09-30 DIAGNOSIS — M79641 Pain in right hand: Secondary | ICD-10-CM | POA: Diagnosis not present

## 2018-09-30 DIAGNOSIS — R2689 Other abnormalities of gait and mobility: Secondary | ICD-10-CM | POA: Diagnosis not present

## 2018-09-30 DIAGNOSIS — R2681 Unsteadiness on feet: Secondary | ICD-10-CM | POA: Diagnosis not present

## 2018-10-02 NOTE — Therapy (Signed)
[facility] [address] [place], [place], [address] Phone: [phone]   Fax:  [phone]  Physical Therapy Treatment  Patient Details  Name: Danielle Buchanan MRN: [id number] Date of Birth: 06/12/1956 Referring Provider (PT): Maurice Small, MD   Encounter Date: 09/30/2018     09/30/18 1104  PT Visits / Re-Eval  Visit Number 36  Number of Visits 44  Date for PT Re-Evaluation 11/21/18  Authorization  Authorization Type [employer]  Authorization Time Period $3400 OOP max has been met prior to PT evaluation. Visit limit Medicare guidelines  PT Time Calculation  PT Start Time [phone]  PT Stop Time 1015  PT Time Calculation (min) 39 min  PT - End of Session  Equipment Utilized During Treatment Gait belt  Activity Tolerance Patient tolerated treatment well;Patient limited by pain (limited by hand pain with increased pressure, rest breaks helped)  Behavior During Therapy [facility] for tasks assessed/performed    Past Medical History:  Diagnosis Date  . Allergic rhinitis   . Depression   . Diabetes ([place])   . Dyslipidemia   . HTN (hypertension)   . Insomnia   . OSA (obstructive sleep apnea)    no cpap  . Urinary incontinence     Past Surgical History:  Procedure Laterality Date  . ABDOMINAL AORTOGRAM W/LOWER EXTREMITY N/A 06/24/2017   Procedure: ABDOMINAL AORTOGRAM W/LOWER EXTREMITY;  Surgeon: Waynetta Sandy, MD;  Location: [facility];  Service: Cardiovascular;  Laterality: N/A;  . ABDOMINAL AORTOGRAM W/LOWER EXTREMITY Right 10/09/2017   Procedure: ABDOMINAL AORTOGRAM W/LOWER EXTREMITY;  Surgeon: Waynetta Sandy, MD;  Location: [place] [facility];  Service: Cardiovascular;  Laterality: Right;  . AMPUTATION Left 07/23/2017   Procedure: AMPUTATION  BELOW KNEE;  Surgeon: Waynetta Sandy, MD;  Location: [facility];  Service: Vascular;  Laterality: Left;  . AMPUTATION Left 08/12/2017   Procedure: REVISION BELOW KNEE;  Surgeon: Waynetta Sandy, MD;  Location: [facility];  Service: Vascular;  Laterality: Left;  . AMPUTATION Left 08/27/2017   Procedure: left ABOVE KNEE AMPUTATION;  Surgeon: Waynetta Sandy, MD;  Location: [facility];  Service: Vascular;  Laterality: Left;  . AMPUTATION Left 08/30/2017   Procedure: REVISION AMPUTATION ABOVE KNEE;  Surgeon: Serafina Mitchell, MD;  Location: [facility];  Service: Vascular;  Laterality: Left;  . AMPUTATION Right 11/04/2017   Procedure: AMPUTATION RIGHT FOURTH TOE;  Surgeon: Waynetta Sandy, MD;  Location: [facility];  Service: Vascular;  Laterality: Right;  . APPLICATION OF WOUND VAC Left 08/27/2017   Procedure: APPLICATION OF WOUND VAC;  Surgeon: Waynetta Sandy, MD;  Location: [facility];  Service: Vascular;  Laterality: Left;  . CARPAL TUNNEL RELEASE Right   . COLONOSCOPY    . LOWER EXTREMITY INTERVENTION Right 10/09/2017   Procedure: LOWER EXTREMITY INTERVENTION;  Surgeon: Waynetta Sandy, MD;  Location: [facility];  Service: Cardiovascular;  Laterality: Right;    There were no vitals filed for this visit.     09/30/18 0936  Symptoms/Limitations  Subjective Prosthetist moved prosthetic knee more anterior so not so posterior set.   Patient is accompained by: Family member  Pertinent History left TFA, right 4th toe amputation, DM2, PAD, HTN, right carpal tunnel release,  Limitations Lifting;Standing;Walking;House hold activities  Patient Stated Goals Walk with prosthesis in home & in community  Pain Assessment  Pain Onset More than a month ago    Prosthetic Training with Transfemoral Prosthesis: Pt arrived in w/c  with prosthesis off for first time in several weeks. She reports that she fell walking with nephew to car to come to PT when prosthetic knee buckled. She reports no injuries as mainly fell onto hard prosthetic socket and nephew helped break fall & lower her to ground. PT assessed pt  and no injuries or issues noted. Pt donned prosthesis. Velcro strap is loosed. She is scheduled to get new liners at prosthetist 10/03/2018 and PT recommended locktite so strap stays in proper orientation. Prosthetist note states using warranty to get new knee due to noises PT heard. Prosthetist used new plate to decrease the posterior offset & plate appears thicker / more durable and she also increased extension assist as PT requested. Change in knee alignment may be factor in fall. Pt ambulated 37' with RW with prosthetic knee unlocked and required seated rest due to right knee pain 6/10 (initially 0/10).  5 minute rest and pt ambulated 10' but knee pain still 6/10. 5 min longer rest recovered knee pain to 3/10 and pt ambulated 40' with prosthetic knee locked. Knee pain 4/10.                           09/30/18 1804  Plan  Clinical Impression Statement Patient's pain in right hand & today right knee limited distance & standing/weightbearing tolerance. PT recommended leaving knee locked for activities for next week to determine if leaving prosthetic knee locked will increase distance/activity tolerance.    Pt will benefit from skilled therapeutic intervention in order to improve on the following deficits Abnormal gait;Decreased activity tolerance;Decreased balance;Decreased coordination;Decreased endurance;Decreased knowledge of use of DME;Decreased mobility;Decreased range of motion;Decreased scar mobility;Decreased strength;Impaired flexibility;Postural dysfunction;Obesity;Prosthetic Dependency  Rehab Potential Good  Clinical Impairments Affecting Rehab Potential pt unable to read / write and sister who is caregiver reports limited ability to read / write  PT Frequency 1x / week  PT Duration 12 weeks  PT Treatment/Interventions ADLs/Self Care Home Management;DME Instruction;Gait training;Stair training;Functional mobility training;Therapeutic activities;Therapeutic  exercise;Balance training;Neuromuscular re-education;Patient/family education;Prosthetic Training;Passive range of motion;Vestibular;Canalith Repostioning  PT Next Visit Plan work towards updated STGs, check pain issues with hand & knee changed by leaving knee locked,   Consulted and Agree with Plan of Care Patient;Family member/caregiver  Family Member Consulted Nephew in law- Blandon       PT Short Term Goals - 08/26/18 1906      PT SHORT TERM GOAL #1   Title  Patient verbalizes how to proper manage sweat with prosthesis. (All STGs Target Date: 09/30/2018)    Time  1    Period  Months    Status  New    Target Date  09/30/18      PT SHORT TERM GOAL #2   Title  Patient performs sit to/from stand and stand-pivot transfers with prosthetic knee unlocked safely with supervision.     Time  1    Period  Months    Status  New    Target Date  09/30/18      PT SHORT TERM GOAL #3   Title  Patient standing balance with intermittent UE support dynamic scanning & static 1 minute no UE support with supervision.     Time  1    Period  Months    Status  New    Target Date  09/30/18      PT SHORT TERM GOAL #4   Title  Patient ambulates 180' with RW & prosthesis with supervision.  Time  1    Period  Months    Status  New    Target Date  09/30/18        PT Short Term Goals - 10/02/18 0917      PT SHORT TERM GOAL #1   Title  Patient reports RLE / knee pain & right hand pain increases <4 increments on 0-10 scale with standing & gait activities.  (All updated STGs Target Date: 10/31/2018)    Time  1    Period  Months    Status  New    Target Date  10/31/18      PT SHORT TERM GOAL #2   Title  Patient reports able to wear prosthesis to toilet using BSC as raised toilet seat including transfers & managing pants.     Time  1    Period  Months    Status  New    Target Date  10/31/18      PT SHORT TERM GOAL #3   Title  Patient ambulates 79' with prosthesis & RW with supervision.      Time  1    Period  Months    Status  Revised    Target Date  10/31/18      PT SHORT TERM GOAL #4   Title  Patient negotiates ramps & curbs with RW & prosthesis with supervision.     Time  1    Period  Months    Status  Revised    Target Date  10/31/18        PT Long Term Goals - 08/26/18 1900      PT LONG TERM GOAL #1   Title  Patient verbalizes & demonstrates understanding of prosthetic care & wears >80% of awake hours to enable safe use of prosthesis. (All LTGs Target Date: 11/21/2018)     Time  3    Period  Months    Status  New    Target Date  11/21/18      PT LONG TERM GOAL #2   Title  Berg Balance >36/56 to indicate lower fall risk.     Time  3    Period  Months    Status  New    Target Date  11/21/18      PT LONG TERM GOAL #3   Title  Patient ambulates 250' with RW & proshthesis modified independent to enable independent community mobility.     Time  3    Period  Weeks    Status  Revised    Target Date  11/21/18      PT LONG TERM GOAL #4   Title  Patient negotiates ramps & curbs with RW & prosthesis modified independent.     Time  3    Period  Months    Status  Revised    Target Date  11/21/18              Patient will benefit from skilled therapeutic intervention in order to improve the following deficits and impairments:     Visit Diagnosis: Muscle weakness (generalized)  Unsteadiness on feet  Other abnormalities of gait and mobility  Abnormal posture  Pain in right hand     Problem List Patient Active Problem List   Diagnosis Date Noted  . Pain in finger of right hand 02/20/2018  . Atherosclerosis of native arteries of the extremities with gangrene ([place]) 11/01/2017  . Unilateral AKA, left ([place])   . Post-operative pain   .  OSA (obstructive sleep apnea)   . Benign essential HTN   . Diabetes mellitus type 2 in obese ([place])   . Hyperkalemia   . Leukocytosis   . Acute blood loss anemia   . Amputation stump infection ([place])  08/23/2017  . Amputation of left lower extremity above knee upon examination ([place]) 08/23/2017  . PAD (peripheral artery disease) ([place]) 07/23/2017  . Cellulitis 06/10/2017  . Hyponatremia 06/10/2017  . Fever 06/10/2017  . Rash 05/24/2017  . Diabetes ([place])   . HTN (hypertension)   . Allergic rhinitis   . Insomnia   . Depression     Danise Dehne PT, DPT 10/02/2018, 7:56 AM  [facility] [address] [place], [place], [address] Phone: [phone]   Fax:  [phone]  Name: Danielle Buchanan MRN: [id number] Date of Birth: [birth year]

## 2018-10-03 DIAGNOSIS — Z89612 Acquired absence of left leg above knee: Secondary | ICD-10-CM | POA: Diagnosis not present

## 2018-10-04 DIAGNOSIS — R296 Repeated falls: Secondary | ICD-10-CM | POA: Diagnosis not present

## 2018-10-04 DIAGNOSIS — Z89619 Acquired absence of unspecified leg above knee: Secondary | ICD-10-CM | POA: Diagnosis not present

## 2018-10-04 DIAGNOSIS — E1121 Type 2 diabetes mellitus with diabetic nephropathy: Secondary | ICD-10-CM | POA: Diagnosis not present

## 2018-10-04 DIAGNOSIS — T8189XA Other complications of procedures, not elsewhere classified, initial encounter: Secondary | ICD-10-CM | POA: Diagnosis not present

## 2018-10-04 DIAGNOSIS — S88012A Complete traumatic amputation at knee level, left lower leg, initial encounter: Secondary | ICD-10-CM | POA: Diagnosis not present

## 2018-10-04 DIAGNOSIS — Z89612 Acquired absence of left leg above knee: Secondary | ICD-10-CM | POA: Diagnosis not present

## 2018-10-06 DIAGNOSIS — T8189XA Other complications of procedures, not elsewhere classified, initial encounter: Secondary | ICD-10-CM | POA: Diagnosis not present

## 2018-10-06 DIAGNOSIS — E1121 Type 2 diabetes mellitus with diabetic nephropathy: Secondary | ICD-10-CM | POA: Diagnosis not present

## 2018-10-06 DIAGNOSIS — Z89612 Acquired absence of left leg above knee: Secondary | ICD-10-CM | POA: Diagnosis not present

## 2018-10-06 DIAGNOSIS — Z89619 Acquired absence of unspecified leg above knee: Secondary | ICD-10-CM | POA: Diagnosis not present

## 2018-10-06 DIAGNOSIS — S88012A Complete traumatic amputation at knee level, left lower leg, initial encounter: Secondary | ICD-10-CM | POA: Diagnosis not present

## 2018-10-06 DIAGNOSIS — R296 Repeated falls: Secondary | ICD-10-CM | POA: Diagnosis not present

## 2018-10-07 ENCOUNTER — Encounter: Payer: Self-pay | Admitting: Physical Therapy

## 2018-10-07 ENCOUNTER — Ambulatory Visit: Payer: Medicare HMO | Admitting: Physical Therapy

## 2018-10-07 DIAGNOSIS — R2681 Unsteadiness on feet: Secondary | ICD-10-CM | POA: Diagnosis not present

## 2018-10-07 DIAGNOSIS — R2689 Other abnormalities of gait and mobility: Secondary | ICD-10-CM | POA: Diagnosis not present

## 2018-10-07 DIAGNOSIS — M6281 Muscle weakness (generalized): Secondary | ICD-10-CM | POA: Diagnosis not present

## 2018-10-07 DIAGNOSIS — R293 Abnormal posture: Secondary | ICD-10-CM | POA: Diagnosis not present

## 2018-10-07 DIAGNOSIS — M79641 Pain in right hand: Secondary | ICD-10-CM

## 2018-10-08 NOTE — Therapy (Signed)
[facility] [address] [place], [place], [address] Phone: [phone]   Fax:  [phone]  Physical Therapy Treatment  Patient Details  Name: Danielle Buchanan MRN: [id number] Date of Birth: 14-Mar-1956 Referring Provider (PT): Maurice Small, MD   Encounter Date: 10/07/2018  PT End of Session - 10/07/18 1109    Visit Number  37    Number of Visits  44    Date for PT Re-Evaluation  11/21/18    Authorization Type  [employer]    Authorization Time Period  $3400 OOP max has been met prior to PT evaluation. Visit limit Medicare guidelines    PT Start Time  2111    PT Stop Time  1100    PT Time Calculation (min)  45 min    Equipment Utilized During Treatment  Gait belt    Activity Tolerance  Patient tolerated treatment well;Patient limited by pain   limited by hand pain with increased pressure, rest breaks helped   Behavior During Therapy  WFL for tasks assessed/performed       Past Medical History:  Diagnosis Date  . Allergic rhinitis   . Depression   . Diabetes ([place])   . Dyslipidemia   . HTN (hypertension)   . Insomnia   . OSA (obstructive sleep apnea)    no cpap  . Urinary incontinence     Past Surgical History:  Procedure Laterality Date  . ABDOMINAL AORTOGRAM W/LOWER EXTREMITY N/A 06/24/2017   Procedure: ABDOMINAL AORTOGRAM W/LOWER EXTREMITY;  Surgeon: Waynetta Sandy, MD;  Location: [facility];  Service: Cardiovascular;  Laterality: N/A;  . ABDOMINAL AORTOGRAM W/LOWER EXTREMITY Right 10/09/2017   Procedure: ABDOMINAL AORTOGRAM W/LOWER EXTREMITY;  Surgeon: Waynetta Sandy, MD;  Location: [facility];  Service: Cardiovascular;  Laterality: Right;  . AMPUTATION Left 07/23/2017   Procedure: AMPUTATION  BELOW KNEE;  Surgeon: Waynetta Sandy, MD;  Location: [facility];  Service: Vascular;  Laterality: Left;  . AMPUTATION Left 08/12/2017   Procedure: REVISION BELOW KNEE;   Surgeon: Waynetta Sandy, MD;  Location: [facility];  Service: Vascular;  Laterality: Left;  . AMPUTATION Left 08/27/2017   Procedure: left ABOVE KNEE AMPUTATION;  Surgeon: Waynetta Sandy, MD;  Location: [facility];  Service: Vascular;  Laterality: Left;  . AMPUTATION Left 08/30/2017   Procedure: REVISION AMPUTATION ABOVE KNEE;  Surgeon: Serafina Mitchell, MD;  Location: [facility];  Service: Vascular;  Laterality: Left;  . AMPUTATION Right 11/04/2017   Procedure: AMPUTATION RIGHT FOURTH TOE;  Surgeon: Waynetta Sandy, MD;  Location: [facility];  Service: Vascular;  Laterality: Right;  . APPLICATION OF WOUND VAC Left 08/27/2017   Procedure: APPLICATION OF WOUND VAC;  Surgeon: Waynetta Sandy, MD;  Location: [facility];  Service: Vascular;  Laterality: Left;  . CARPAL TUNNEL RELEASE Right   . COLONOSCOPY    . LOWER EXTREMITY INTERVENTION Right 10/09/2017   Procedure: LOWER EXTREMITY INTERVENTION;  Surgeon: Waynetta Sandy, MD;  Location: [facility];  Service: Cardiovascular;  Laterality: Right;    There were no vitals filed for this visit.  Subjective Assessment - 10/07/18 1015    Subjective  She got new liners and they seem to hold her tissue better. She has been walking with prosthetic knee locked as PT recommended. Her knee pain cleared about 2 days after last PT appt.     Patient is accompained by:  Family member    Pertinent History  left TFA, right 4th toe amputation, DM2, PAD, HTN, right carpal tunnel release,    Limitations  Lifting;Standing;Walking;House hold activities    Patient Stated Goals  Walk with prosthesis in home & in community    Currently in Pain?  Yes    Pain Score  7     Pain Location  Finger (Comment which one)    Pain Orientation  Right   little finger   Pain Descriptors / Indicators  Pins and needles    Pain Type  Neuropathic pain;Chronic pain    Pain Onset  More than a month ago    Pain Frequency  Intermittent    Aggravating  Factors   weightbearing on walker    Pain Relieving Factors  contrast bath                       [facility] Adult PT Treatment/Exercise - 10/07/18 1015      Transfers   Transfers  Sit to Stand;Stand to [employer] Transfers    Sit to Stand  5: Supervision;With upper extremity assist;With armrests;From chair/3-in-1;Other (comment)   to RW for stabilization, prosthetic knee locked upon arising   Stand to Sit  5: Supervision;With upper extremity assist;With armrests;To chair/3-in-1;Other (comment)   from RW, unlocks prosthetic knee after sitting   Stand Pivot Transfers  5: Supervision;With armrests   with RW & unlocked prosthetic knee, 1 chair without armrests     Ambulation/Gait   Ambulation/Gait  Yes    Ambulation/Gait Assistance  5: Supervision    Ambulation/Gait Assistance Details  PT demo, tactile & verbal cues on increasing stance duration and upright posture to decrease stress on RLE & RUE.     Ambulation Distance (Feet)  100 Feet   100', 40'ramp/curb & 80' limited by rt knee,hand/finger pain   Assistive device  Rolling walker;Prosthesis   TFA locked knee prosthesis   Gait Pattern  Step-through pattern;Decreased step length - right;Decreased stance time - left;Left hip hike;Left circumduction;Antalgic;Trunk flexed;Abducted - left;Poor foot clearance - left    Ambulation Surface  Indoor;Level    Ramp  5: Supervision   RW & locked knee prosthesis   Ramp Details (indicate cue type and reason)  cues on posture & wt shift    Curb  5: Supervision   RW & locked prosthesis   Curb Details (indicate cue type and reason)  verbal cues on sequence & wt shift      Prosthetics   Prosthetic Care Comments   Continue with locked prosthetic knee activities.    Current prosthetic wear tolerance (days/week)   daily    Current prosthetic wear tolerance (#hours/day)   most of awake hours removes to toilet due to no armrests on toilet resulting in balance loss    Current prosthetic  weight-bearing tolerance (hours/day)   Patient tolerates standing 5-10 minutes with no leg pain but right hand / little finger pain with weight bearing on RW.     Residual limb condition   intact with no issues.     Education Provided  Other (comment)   see prosthetic care comments   Person(s) Educated  Patient;Caregiver(s)    Education Method  Explanation;Verbal cues    Education Method  Verbalized understanding;Verbal cues required;Needs further instruction               PT Short Term Goals - 10/07/18 1319      PT SHORT TERM GOAL #1   Title  Patient reports RLE / knee  pain & right hand pain increases <4 increments on 0-10 scale with standing & gait activities.  (All updated STGs Target Date: 10/31/2018)    Status  On-going    Target Date  10/31/18      PT SHORT TERM GOAL #2   Title  Patient reports able to wear prosthesis to toilet using BSC as raised toilet seat including transfers & managing pants.     Status  On-going    Target Date  10/31/18      PT SHORT TERM GOAL #3   Title  Patient ambulates 62' with prosthesis & RW with supervision.     Status  On-going    Target Date  10/31/18      PT SHORT TERM GOAL #4   Title  Patient negotiates ramps & curbs with RW & prosthesis with supervision.     Status  On-going    Target Date  10/31/18        PT Long Term Goals - 10/07/18 1309      PT LONG TERM GOAL #1   Title  Patient verbalizes & demonstrates understanding of prosthetic care & wears >80% of awake hours to enable safe use of prosthesis. (All LTGs Target Date: 11/21/2018)     Time  3    Period  Months    Status  On-going    Target Date  11/21/18      PT LONG TERM GOAL #2   Title  Berg Balance >36/56 to indicate lower fall risk.     Time  3    Period  Months    Status  On-going    Target Date  11/21/18      PT LONG TERM GOAL #3   Title  Patient ambulates 250' with RW & proshthesis modified independent to enable independent community mobility.     Time  3     Period  Weeks    Status  On-going    Target Date  11/21/18      PT LONG TERM GOAL #4   Title  Patient negotiates ramps & curbs with RW & prosthesis modified independent.     Time  3    Period  Months    Status  On-going    Target Date  11/21/18            Plan - 10/07/18 1310    Clinical Impression Statement  Patient was able to decrease RLE knee & Rt hand pain with locked prosthetic knee and focus on increasing prosthetic stance duration.  She met remaining STGs.     Rehab Potential  Good    Clinical Impairments Affecting Rehab Potential  pt unable to read / write and sister who is caregiver reports limited ability to read / write    PT Frequency  1x / week    PT Duration  12 weeks    PT Treatment/Interventions  ADLs/Self Care Home Management;DME Instruction;Gait training;Stair training;Functional mobility training;Therapeutic activities;Therapeutic exercise;Balance training;Neuromuscular re-education;Patient/family education;Prosthetic Training;Passive range of motion;Vestibular;Canalith Repostioning    PT Next Visit Plan  work towards updated STGs with prosthetic knee locked & increasing stance duration.     Consulted and Agree with Plan of Care  Patient;Family member/caregiver    Family Member Consulted  Nephew in law- Greg       Patient will benefit from skilled therapeutic intervention in order to improve the following deficits and impairments:  Abnormal gait, Decreased activity tolerance, Decreased balance, Decreased coordination, Decreased endurance, Decreased knowledge of  use of DME, Decreased mobility, Decreased range of motion, Decreased scar mobility, Decreased strength, Impaired flexibility, Postural dysfunction, Obesity, Prosthetic Dependency  Visit Diagnosis: Muscle weakness (generalized)  Unsteadiness on feet  Other abnormalities of gait and mobility  Abnormal posture  Pain in right hand     Problem List Patient Active Problem List   Diagnosis Date  Noted  . Pain in finger of right hand 02/20/2018  . Atherosclerosis of native arteries of the extremities with gangrene ([place]) 11/01/2017  . Unilateral AKA, left ([place])   . Post-operative pain   . OSA (obstructive sleep apnea)   . Benign essential HTN   . Diabetes mellitus type 2 in obese ([place])   . Hyperkalemia   . Leukocytosis   . Acute blood loss anemia   . Amputation stump infection ([place]) 08/23/2017  . Amputation of left lower extremity above knee upon examination ([place]) 08/23/2017  . PAD (peripheral artery disease) ([place]) 07/23/2017  . Cellulitis 06/10/2017  . Hyponatremia 06/10/2017  . Fever 06/10/2017  . Rash 05/24/2017  . Diabetes ([place])   . HTN (hypertension)   . Allergic rhinitis   . Insomnia   . Depression     Canary Fister PT, DPT 10/08/2018, 1:20 PM  [facility] [address] [place], [place], [address] Phone: [phone]   Fax:  [phone]  Name: Danielle Buchanan MRN: [id number] Date of Birth: 12-21-1955

## 2018-10-09 ENCOUNTER — Other Ambulatory Visit: Payer: Self-pay

## 2018-10-09 DIAGNOSIS — Z89612 Acquired absence of left leg above knee: Secondary | ICD-10-CM

## 2018-10-09 DIAGNOSIS — I779 Disorder of arteries and arterioles, unspecified: Secondary | ICD-10-CM

## 2018-10-09 DIAGNOSIS — F17200 Nicotine dependence, unspecified, uncomplicated: Secondary | ICD-10-CM

## 2018-10-09 DIAGNOSIS — I739 Peripheral vascular disease, unspecified: Secondary | ICD-10-CM

## 2018-10-14 ENCOUNTER — Ambulatory Visit: Payer: Medicare HMO | Admitting: Physical Therapy

## 2018-10-20 ENCOUNTER — Encounter: Payer: Self-pay | Admitting: Physical Therapy

## 2018-10-20 ENCOUNTER — Ambulatory Visit: Payer: Medicare HMO | Attending: Family Medicine | Admitting: Physical Therapy

## 2018-10-20 DIAGNOSIS — R2681 Unsteadiness on feet: Secondary | ICD-10-CM | POA: Diagnosis not present

## 2018-10-20 DIAGNOSIS — M6281 Muscle weakness (generalized): Secondary | ICD-10-CM | POA: Insufficient documentation

## 2018-10-20 DIAGNOSIS — R2689 Other abnormalities of gait and mobility: Secondary | ICD-10-CM | POA: Insufficient documentation

## 2018-10-20 DIAGNOSIS — R293 Abnormal posture: Secondary | ICD-10-CM | POA: Diagnosis not present

## 2018-10-20 DIAGNOSIS — M25652 Stiffness of left hip, not elsewhere classified: Secondary | ICD-10-CM | POA: Insufficient documentation

## 2018-10-20 DIAGNOSIS — M79641 Pain in right hand: Secondary | ICD-10-CM

## 2018-10-21 ENCOUNTER — Encounter: Payer: Self-pay | Admitting: Family

## 2018-10-21 ENCOUNTER — Other Ambulatory Visit: Payer: Self-pay

## 2018-10-21 ENCOUNTER — Ambulatory Visit (HOSPITAL_COMMUNITY)
Admission: RE | Admit: 2018-10-21 | Discharge: 2018-10-21 | Disposition: A | Discharge status: Home / Self Care | Payer: Medicare HMO | Source: Ambulatory Visit | Attending: Family Medicine | Admitting: Family Medicine

## 2018-10-21 ENCOUNTER — Ambulatory Visit (INDEPENDENT_AMBULATORY_CARE_PROVIDER_SITE_OTHER)
Admission: RE | Admit: 2018-10-21 | Discharge: 2018-10-21 | Disposition: A | Discharge status: Home / Self Care | Payer: Medicare HMO | Source: Ambulatory Visit | Attending: Family Medicine | Admitting: Family Medicine

## 2018-10-21 ENCOUNTER — Ambulatory Visit (INDEPENDENT_AMBULATORY_CARE_PROVIDER_SITE_OTHER): Payer: Medicare HMO | Admitting: Family

## 2018-10-21 VITALS — BP 124/73 | HR 69 | Temp 97.1°F | Resp 20 | Ht 65.0 in | Wt 210.0 lb

## 2018-10-21 DIAGNOSIS — E1165 Type 2 diabetes mellitus with hyperglycemia: Secondary | ICD-10-CM

## 2018-10-21 DIAGNOSIS — Z89612 Acquired absence of left leg above knee: Secondary | ICD-10-CM

## 2018-10-21 DIAGNOSIS — Z89421 Acquired absence of other right toe(s): Secondary | ICD-10-CM | POA: Diagnosis not present

## 2018-10-21 DIAGNOSIS — F17200 Nicotine dependence, unspecified, uncomplicated: Secondary | ICD-10-CM

## 2018-10-21 DIAGNOSIS — I739 Peripheral vascular disease, unspecified: Secondary | ICD-10-CM | POA: Diagnosis not present

## 2018-10-21 DIAGNOSIS — E1151 Type 2 diabetes mellitus with diabetic peripheral angiopathy without gangrene: Secondary | ICD-10-CM

## 2018-10-21 DIAGNOSIS — I779 Disorder of arteries and arterioles, unspecified: Secondary | ICD-10-CM | POA: Insufficient documentation

## 2018-10-21 DIAGNOSIS — IMO0002 Reserved for concepts with insufficient information to code with codable children: Secondary | ICD-10-CM
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
## 2018-10-21 NOTE — Progress Notes (Signed)
[facility]   CC: Follow up peripheral artery occlusive disease  History of Present Illness Danielle Buchanan is a 63 y.o. female who is s/p right 4th toe amputationon 11-04-17 by [name] fornecrotic right fourth toe. Shehas undergone balloon angioplasty of her right lower extremity on 10-09-17  as well as a left-sided above-knee amputation in November 2018 that required a revision a few days later.   She walks well with her walker.   She is receiving physical therapy with her prosthesis; she received new liners, is working with [employer].   Her endocrinologist was Dr. Everardo All, will be seeing Dr. Sharl Ma starting in July 2020. Metformin causes diarrhea, is not taking.   She is seeing a podiatrist, states she has a follow up appointment in early February 2020.  She injured her right 3rd toe since she has seen the podiatrist, on a door jam she thinks, this has an eschar, no signs of infection.   Diabetic: Yes, 10.7 A1C on 03-11-18, uncontrolled Tobacco use: smoker  (1-2 cigs/day, started in her teens)  Pt meds include: Statin :Yes Betablocker: No ASA: Yes Other anticoagulants/antiplatelets: no  Past Medical History:  Diagnosis Date  . Allergic rhinitis   . Depression   . Diabetes ([facility])   . Dyslipidemia   . HTN (hypertension)   . Insomnia   . OSA (obstructive sleep apnea)    no cpap  . Urinary incontinence     Social History Social History   Tobacco Use  . Smoking status: Light Tobacco Smoker    Packs/day: 1.00    Years: 51.00    Pack years: 51.00    Types: Cigarettes    Last attempt to quit: 06/08/2017    Years since quitting: 1.3  . Smokeless tobacco: Never Used  Substance Use Topics  . Alcohol use: No    Alcohol/week: 0.0 standard drinks  . Drug use: No    Family History Family History  Problem Relation Age of Onset  . Heart attack Mother   . Alcohol abuse Mother   . Heart attack Father   . Alcohol abuse  Father   . Heart attack Sister   . Heart attack Brother   . Hypercholesterolemia Sister   . Diabetes Neg Hx     Past Surgical History:  Procedure Laterality Date  . ABDOMINAL AORTOGRAM W/LOWER EXTREMITY N/A 06/24/2017   Procedure: ABDOMINAL AORTOGRAM W/LOWER EXTREMITY;  Surgeon: Maeola Harman, MD;  Location: [facility];  Service: Cardiovascular;  Laterality: N/A;  . ABDOMINAL AORTOGRAM W/LOWER EXTREMITY Right 10/09/2017   Procedure: ABDOMINAL AORTOGRAM W/LOWER EXTREMITY;  Surgeon: Maeola Harman, MD;  Location: [facility];  Service: Cardiovascular;  Laterality: Right;  . AMPUTATION Left 07/23/2017   Procedure: AMPUTATION  BELOW KNEE;  Surgeon: Maeola Harman, MD;  Location: [facility];  Service: Vascular;  Laterality: Left;  . AMPUTATION Left 08/12/2017   Procedure: REVISION BELOW KNEE;  Surgeon: Maeola Harman, MD;  Location: [facility];  Service: Vascular;  Laterality: Left;  . AMPUTATION Left 08/27/2017   Procedure: left ABOVE KNEE AMPUTATION;  Surgeon: Maeola Harman, MD;  Location: [facility];  Service: Vascular;  Laterality: Left;  . AMPUTATION Left 08/30/2017   Procedure: REVISION AMPUTATION ABOVE KNEE;  Surgeon: Nada Libman, MD;  Location[facility];  Service: Vascular;  Laterality: Left;  . AMPUTATION Right 11/04/2017   Procedure: AMPUTATION RIGHT FOURTH TOE;  Surgeon: Maeola Harman, MD;  [facility];  Service: Vascular;  Laterality: Right;  . APPLICATION OF WOUND VAC Left 08/27/2017   Procedure: APPLICATION OF WOUND VAC;  Surgeon: Maeola Harmanain, Brandon Christopher, MD;  [facility];  Service: Vascular;  Laterality: Left;  . CARPAL TUNNEL RELEASE Right   . COLONOSCOPY    . LOWER EXTREMITY INTERVENTION Right 10/09/2017   Procedure: LOWER EXTREMITY INTERVENTION;  Surgeon: Maeola Harmanain, Brandon Christopher, MD;  [facility];  Service: Cardiovascular;  Laterality: Right;    Allergies  Allergen Reactions  .  Milk-Related Compounds     incontinence  . Doxycycline Rash    Current Outpatient Medications  Medication Sig Dispense Refill  . amitriptyline (ELAVIL) 25 MG tablet Take 25 mg by mouth at bedtime.    Marland Kitchen. aspirin (ASPIRIN 81) 81 MG chewable tablet Aspir-81    . aspirin EC 325 MG tablet Take 325 mg by mouth daily.    . insulin NPH-regular Human (NOVOLIN 70/30) (70-30) 100 UNIT/ML injection 120 units with breakfast, and 90 units with supper, and syringes 3/day. 210 mL 3  . losartan (COZAAR) 100 MG tablet Take 100 mg by mouth daily.    . Melatonin 10 MG TABS Take 20 mg by mouth at bedtime.    . nicotine polacrilex (NICORELIEF) 2 MG gum Take 2 mg by mouth as needed for smoking cessation.    . pravastatin (PRAVACHOL) 20 MG tablet Take 20 mg by mouth daily.     . sertraline (ZOLOFT) 100 MG tablet Take 150 mg by mouth daily.     . traZODone (DESYREL) 50 MG tablet Take 50 mg by mouth at bedtime.    . TRUE METRIX BLOOD GLUCOSE TEST test strip USE TO CHECK YOUR BLOOD SUGAR TWICE A DAY (DX: E11.21) IN VITRO 90 DAYS  3  . VITAMIN E PO Take 180 mg by mouth daily.    . Zinc 50 MG TABS Take 50 mg by mouth daily.     No current facility-administered medications for this visit.     ROS: See HPI for pertinent positives and negatives.   Physical Examination  Vitals:   10/21/18 1048  BP: 124/73  Pulse: 69  Resp: 20  Temp: (!) 97.1 F (36.2 C)  SpO2: 97%  Weight: 210 lb (95.3 kg)  Height: 5\' 5"  (1.651 m)   Body mass index is 34.95 kg/m.  General: A&O x 3, WDWN, obese female. Gait: seated in her w/c HENT: No gross abnormalities.  Eyes: PERRLA. Pulmonary: Respirations are non labored, fair air movement in all fields, few rales in bases, no rhonchi or wheezes Cardiac: regular rhythm, no detected murmur.         Carotid Bruits Right Left   Negative Negative   Radial pulses are 2+ palpable bilaterally   Adominal aortic pulse is not palpable                         VASCULAR  EXAM: Extremities without ischemic changes, without Gangrene; without open wounds. Right 4th toe amputation site is well healed. No signs of ischemia in the right foot. Left AKA stump cover in place. Healing dry (eschar) wound at dorsal aspect right 3rd toe. The wound is dry and has signs of granulation around the edges; tip of that toe is pink, warm, with brisk capillary refill.  LE Pulses Right Left       FEMORAL  not palpable, seated in w/c  not palpable        POPLITEAL  not palpable   AKA       POSTERIOR TIBIAL  not palpable   AKA        DORSALIS PEDIS      ANTERIOR TIBIAL not palpable  AKA    Abdomen: soft, NT, no palpable masses. Large panus Skin: no rashes, no cellulitis, no ulcers noted. Musculoskeletal: no muscle wasting or atrophy. See Extremities.  Neurologic: A&O X 3; appropriate affect, Sensation is normal; MOTOR FUNCTION:  moving all extremities equally, motor strength 5/5 throughout. Speech is fluent/normal. CN 2-12 intact. Psychiatric: Thought content is normal, mood appropriate for clinical situation.    ASSESSMENT: Danielle Buchanan is a 63 y.o. female who is s/p right 4th toe amputationon 11-04-17 by Dr. Randie Heinzain fornecrotic right fourth toe. Shehas undergone balloon angioplasty of her right lower extremity on 10-09-17  as well as a left-sided above-knee amputation.   Right 4th toe amputation site is well healed She injured her right 3rd toe since she has seen the podiatrist, on a door jam she thinks, this has an eschar, no signs of infection.   Left AKA stump is well healed, iswearing herleft AKA prosthesis stump cover.  Her atherosclerotic risk factors include uncontrolled DM, 51+ years of smoking, obesity, and OSA.  I advised her to work closely with her provider that helps her manage her DM, in order to decrease her risk of loss of her right leg, MI,  stoke, diabetic retinopathy, and other complications from uncontrolled DM.   Current smoker: Over 3 minutes was spent counseling patient re smoking cessation, and patient was given several free resources re smoking cessation.   DATA  Right LE Arterial Duplex (10-21-18): Highest velocity is at the mid CFA at 151 cm/s, all biphasic waveforms except monophasic at the distal PTA.  No significant change in stenosis compared to the exam on 07-28-18.   ABI (Date: 10/21/2018):  R:   ABI: 0.86 (was 1.02 on 07-28-18),   PT: mono (was bi)  DP: mono (was bi)  TBI:  0.44, toe pressure 71 (was 0.45)  L: AKA Decline in right ABI from normal to mild disease, from biphasic waveforms to monophasic. ABI of 0.86 does not correspond to monophasic waveforms, likely due to non compressible vessels.  PLAN:  Based on the patient's vascular studies and examination, pt will return to clinic in 3 months with right ABI. I advised pt and her relative with her to notify us if she develops concerns re the circulation in her feet or legs.   I discussed in depth with the patient the nature of atherosclerosis, and emphasized the importance of maximal medical management including strict control of blood pressure, blood glucose, and lipid levels, obtaining regular exercise, and cessation of smoking.  The patient is aware that without maximal medical management the underlying atherosclerotic disease process will progress, limiting the benefit of any interventions.  The patient was given information about PAD including signs, symptoms, treatment, what symptoms should prompt the patient to seek immediate medical care, and risk reduction measures to take.  Charisse MarchSuzanne Ehren Berisha, RN, MSN, FNP-C [facility] Office Phone: [phone]  Clinic MD: Bonnye Favaarly, [place]  10/21/18 11:31 AM

## 2018-10-21 NOTE — Patient Instructions (Signed)
Steps to Quit Smoking  Smoking tobacco can be bad for your health. It can also affect almost every organ in your body. Smoking puts you and people around you at risk for many serious long-lasting (chronic) diseases. Quitting smoking is hard, but it is one of the best things that you can do for your health. It is never too late to quit. What are the benefits of quitting smoking? When you quit smoking, you lower your risk for getting serious diseases and conditions. They can include:  Lung cancer or lung disease.  Heart disease.  Stroke.  Heart attack.  Not being able to have children (infertility).  Weak bones (osteoporosis) and broken bones (fractures). If you have coughing, wheezing, and shortness of breath, those symptoms may get better when you quit. You may also get sick less often. If you are pregnant, quitting smoking can help to lower your chances of having a baby of low birth weight. What can I do to help me quit smoking? Talk with your doctor about what can help you quit smoking. Some things you can do (strategies) include:  Quitting smoking totally, instead of slowly cutting back how much you smoke over a period of time.  Going to in-person counseling. You are more likely to quit if you go to many counseling sessions.  Using resources and support systems, such as: ? Online chats with a counselor. ? Phone quitlines. ? Printed self-help materials. ? Support groups or group counseling. ? Text messaging programs. ? Mobile phone apps or applications.  Taking medicines. Some of these medicines may have nicotine in them. If you are pregnant or breastfeeding, do not take any medicines to quit smoking unless your doctor says it is okay. Talk with your doctor about counseling or other things that can help you. Talk with your doctor about using more than one strategy at the same time, such as taking medicines while you are also going to in-person counseling. This can help make  quitting easier. What things can I do to make it easier to quit? Quitting smoking might feel very hard at first, but there is a lot that you can do to make it easier. Take these steps:  Talk to your family and friends. Ask them to support and encourage you.  Call phone quitlines, reach out to support groups, or work with a counselor.  Ask people who smoke to not smoke around you.  Avoid places that make you want (trigger) to smoke, such as: ? Bars. ? Parties. ? Smoke-break areas at work.  Spend time with people who do not smoke.  Lower the stress in your life. Stress can make you want to smoke. Try these things to help your stress: ? Getting regular exercise. ? Deep-breathing exercises. ? Yoga. ? Meditating. ? Doing a body scan. To do this, close your eyes, focus on one area of your body at a time from head to toe, and notice which parts of your body are tense. Try to relax the muscles in those areas.  Download or buy apps on your mobile phone or tablet that can help you stick to your quit plan. There are many free apps, such as QuitGuide from the CDC (Centers for Disease Control and Prevention). You can find more support from smokefree.gov and other websites. This information is not intended to replace advice given to you by your health care provider. Make sure you discuss any questions you have with your health care provider. Document Released: 07/21/2009 Document Revised: 05/22/2016   Document Reviewed: 02/08/2015 Elsevier Interactive Patient Education  2019 Elsevier Inc.     Peripheral Vascular Disease  Peripheral vascular disease (PVD) is a disease of the blood vessels that are not part of your heart and brain. A simple term for PVD is poor circulation. In most cases, PVD narrows the blood vessels that carry blood from your heart to the rest of your body. This can reduce the supply of blood to your arms, legs, and internal organs, like your stomach or kidneys. However, PVD most  often affects a person's lower legs and feet. Without treatment, PVD tends to get worse. PVD can also lead to acute ischemic limb. This is when an arm or leg suddenly cannot get enough blood. This is a medical emergency. Follow these instructions at home: Lifestyle  Do not use any products that contain nicotine or tobacco, such as cigarettes and e-cigarettes. If you need help quitting, ask your doctor.  Lose weight if you are overweight. Or, stay at a healthy weight as told by your doctor.  Eat a diet that is low in fat and cholesterol. If you need help, ask your doctor.  Exercise regularly. Ask your doctor for activities that are right for you. General instructions  Take over-the-counter and prescription medicines only as told by your doctor.  Take good care of your feet: ? Wear comfortable shoes that fit well. ? Check your feet often for any cuts or sores.  Keep all follow-up visits as told by your doctor This is important. Contact a doctor if:  You have cramps in your legs when you walk.  You have leg pain when you are at rest.  You have coldness in a leg or foot.  Your skin changes.  You are unable to get or have an erection (erectile dysfunction).  You have cuts or sores on your feet that do not heal. Get help right away if:  Your arm or leg turns cold, numb, and blue.  Your arms or legs become red, warm, swollen, painful, or numb.  You have chest pain.  You have trouble breathing.  You suddenly have weakness in your face, arm, or leg.  You become very confused or you cannot speak.  You suddenly have a very bad headache.  You suddenly cannot see. Summary  Peripheral vascular disease (PVD) is a disease of the blood vessels.  A simple term for PVD is poor circulation. Without treatment, PVD tends to get worse.  Treatment may include exercise, low fat and low cholesterol diet, and quitting smoking. This information is not intended to replace advice given to  you by your health care provider. Make sure you discuss any questions you have with your health care provider. Document Released: 12/19/2009 Document Revised: 11/01/2016 Document Reviewed: 11/01/2016 Elsevier Interactive Patient Education  2019 Elsevier Inc.  

## 2018-10-23 ENCOUNTER — Telehealth: Payer: Self-pay | Admitting: *Deleted

## 2018-10-23 DIAGNOSIS — G5622 Lesion of ulnar nerve, left upper limb: Secondary | ICD-10-CM

## 2018-10-23 DIAGNOSIS — G5621 Lesion of ulnar nerve, right upper limb: Secondary | ICD-10-CM | POA: Diagnosis not present

## 2018-10-23 DIAGNOSIS — G5623 Lesion of ulnar nerve, bilateral upper limbs: Secondary | ICD-10-CM | POA: Diagnosis not present

## 2018-10-23 HISTORY — DX: Lesion of ulnar nerve, left upper limb: G56.22

## 2018-10-23 NOTE — Telephone Encounter (Signed)
Attempted x 2 to call patient back. No answer no voice mail.

## 2018-10-28 ENCOUNTER — Encounter: Payer: Self-pay | Admitting: Physical Therapy

## 2018-10-28 ENCOUNTER — Ambulatory Visit: Payer: Medicare HMO | Admitting: Physical Therapy

## 2018-10-28 DIAGNOSIS — M6281 Muscle weakness (generalized): Secondary | ICD-10-CM | POA: Diagnosis not present

## 2018-10-28 DIAGNOSIS — M79641 Pain in right hand: Secondary | ICD-10-CM | POA: Diagnosis not present

## 2018-10-28 DIAGNOSIS — M25652 Stiffness of left hip, not elsewhere classified: Secondary | ICD-10-CM

## 2018-10-28 DIAGNOSIS — R2681 Unsteadiness on feet: Secondary | ICD-10-CM | POA: Diagnosis not present

## 2018-10-28 DIAGNOSIS — R293 Abnormal posture: Secondary | ICD-10-CM | POA: Diagnosis not present

## 2018-10-28 DIAGNOSIS — R2689 Other abnormalities of gait and mobility: Secondary | ICD-10-CM

## 2018-10-29 NOTE — Therapy (Signed)
[facility] [address] [place], [place], [address] Phone: [phone]   Fax:  [phone]  Physical Therapy Treatment  Patient Details  Name: Danielle Buchanan MRN: [id number] Date of Birth: 1956/08/19 Referring Provider (PT): Maurice Small, MD   Encounter Date: 10/28/2018  PT End of Session - 10/28/18 1203    Visit Number  39    Number of Visits  44    Date for PT Re-Evaluation  11/21/18    Authorization Type  [employer]    Authorization Time Period  $3400 OOP max has been met prior to PT evaluation. Visit limit Medicare guidelines    PT Start Time  1020    PT Stop Time  1100    PT Time Calculation (min)  40 min    Equipment Utilized During Treatment  Gait belt    Activity Tolerance  Patient tolerated treatment well;Patient limited by pain   limited by hand pain with increased pressure, rest breaks helped   Behavior During Therapy  WFL for tasks assessed/performed       Past Medical History:  Diagnosis Date  . Allergic rhinitis   . Depression   . Diabetes ([place])   . Dyslipidemia   . HTN (hypertension)   . Insomnia   . OSA (obstructive sleep apnea)    no cpap  . Urinary incontinence     Past Surgical History:  Procedure Laterality Date  . ABDOMINAL AORTOGRAM W/LOWER EXTREMITY N/A 06/24/2017   Procedure: ABDOMINAL AORTOGRAM W/LOWER EXTREMITY;  Surgeon: Waynetta Sandy, MD;  Location: [facility];  Service: Cardiovascular;  Laterality: N/A;  . ABDOMINAL AORTOGRAM W/LOWER EXTREMITY Right 10/09/2017   Procedure: ABDOMINAL AORTOGRAM W/LOWER EXTREMITY;  Surgeon: Waynetta Sandy, MD;  Location: [facility];  Service: Cardiovascular;  Laterality: Right;  . AMPUTATION Left 07/23/2017   Procedure: AMPUTATION  BELOW KNEE;  Surgeon: Waynetta Sandy, MD;  Location: [facility];  Service: Vascular;  Laterality: Left;  . AMPUTATION Left 08/12/2017   Procedure: REVISION BELOW KNEE;   Surgeon: Waynetta Sandy, MD;  Location: [facility];  Service: Vascular;  Laterality: Left;  . AMPUTATION Left 08/27/2017   Procedure: left ABOVE KNEE AMPUTATION;  Surgeon: Waynetta Sandy, MD;  Location: [facility];  Service: Vascular;  Laterality: Left;  . AMPUTATION Left 08/30/2017   Procedure: REVISION AMPUTATION ABOVE KNEE;  Surgeon: Serafina Mitchell, MD;  Location: [facility];  Service: Vascular;  Laterality: Left;  . AMPUTATION Right 11/04/2017   Procedure: AMPUTATION RIGHT FOURTH TOE;  Surgeon: Waynetta Sandy, MD;  Location: [facility];  Service: Vascular;  Laterality: Right;  . APPLICATION OF WOUND VAC Left 08/27/2017   Procedure: APPLICATION OF WOUND VAC;  Surgeon: Waynetta Sandy, MD;  Location: [facility];  Service: Vascular;  Laterality: Left;  . CARPAL TUNNEL RELEASE Right   . COLONOSCOPY    . LOWER EXTREMITY INTERVENTION Right 10/09/2017   Procedure: LOWER EXTREMITY INTERVENTION;  Surgeon: Waynetta Sandy, MD;  Location: [facility];  Service: Cardiovascular;  Laterality: Right;    There were no vitals filed for this visit.  Subjective Assessment - 10/28/18 1015    Subjective  Vascular doctor says toe she injured is not infected. Neurologist is doing a nerve conduction study on left arm. He is concerned with diabetic neuropathy. She should get new socket for prosthesis tomorrow or next week or so.     Patient is accompained by:  Family member  Pertinent History  left TFA, right 4th toe amputation, DM2, PAD, HTN, right carpal tunnel release,    Limitations  Lifting;Standing;Walking;House hold activities    Patient Stated Goals  Walk with prosthesis in home & in community    Currently in Pain?  Yes    Pain Score  8     Pain Location  Hand    Pain Orientation  Right;Left    Pain Descriptors / Indicators  Pins and needles;Burning    Pain Type  Chronic pain;Neuropathic pain    Pain Onset  More than a month ago    Pain Frequency  Constant     Aggravating Factors   coldness, using them    Pain Relieving Factors  rubbing them,                        [facility] Adult PT Treatment/Exercise - 10/28/18 1015      Transfers   Transfers  Sit to Stand;Stand to Sit    Sit to Stand  6: Modified independent (Device/Increase time);With upper extremity assist;With armrests;From chair/3-in-1   to RW   Stand to Sit  6: Modified independent (Device/Increase time);With upper extremity assist;With armrests;To chair/3-in-1   from RW   Stand Pivot Transfers  6: Modified independent (Device/Increase time);With armrests   using RW & locked TFA     Ambulation/Gait   Ambulation/Gait  Yes    Ambulation/Gait Assistance  5: Supervision    Ambulation/Gait Assistance Details  verbal cues on sequencing & RW position to minimize weight bearing on RW    Ambulation Distance (Feet)  25 Feet    Assistive device  Rolling walker;Prosthesis   locked knee TFA prosthesis   Ambulation Surface  Indoor;Level      Prosthetics   Prosthetic Care Comments   Using TFA with manual lock to limit weight bearing on RW    Current prosthetic wear tolerance (days/week)   daily    Current prosthetic wear tolerance (#hours/day)   most of awake hours    Residual limb condition   intact with no issues.              PT Education - 10/28/18 1050    Education Details  positioning & keeping hands warm to help hand pain. Continue to use prosthesis with knee locked to limit UE assist & weight bearing.     Person(s) Educated  Patient;Caregiver(s)    Methods  Explanation;Verbal cues    Comprehension  Verbalized understanding       PT Short Term Goals - 10/28/18 1800      PT SHORT TERM GOAL #1   Title  Patient reports RLE / knee pain & right hand pain increases <4 increments on 0-10 scale with standing & gait activities.  (All updated STGs Target Date: 10/31/2018)    Baseline  NOT MET 10/28/2018  Patient's BUEs / hand pain 8/10 initially prior to standing or gait &  limits gait to 25'    Status  Not Met      PT SHORT TERM GOAL #2   Title  Patient reports able to wear prosthesis to toilet using BSC as raised toilet seat including transfers & managing pants.     Baseline  MET 10/28/2018  Patient verbalizes that she has been able to use prosthesis on toilet in her new handicap apt.    Status  Achieved      PT SHORT TERM GOAL #3   Title  Patient ambulates 130'  with prosthesis & RW with supervision.     Baseline  NOT MET 10/28/2018 Pt only able to ambulate 25' today due to hand pain     Status  Not Met      PT SHORT TERM GOAL #4   Title  Patient negotiates ramps & curbs with RW & prosthesis with supervision.     Baseline  MET 10/20/2018    Status  Achieved        PT Long Term Goals - 10/07/18 1309      PT LONG TERM GOAL #1   Title  Patient verbalizes & demonstrates understanding of prosthetic care & wears >80% of awake hours to enable safe use of prosthesis. (All LTGs Target Date: 11/21/2018)     Time  3    Period  Months    Status  On-going    Target Date  11/21/18      PT LONG TERM GOAL #2   Title  Berg Balance >36/56 to indicate lower fall risk.     Time  3    Period  Months    Status  On-going    Target Date  11/21/18      PT LONG TERM GOAL #3   Title  Patient ambulates 250' with RW & proshthesis modified independent to enable independent community mobility.     Time  3    Period  Weeks    Status  On-going    Target Date  11/21/18      PT LONG TERM GOAL #4   Title  Patient negotiates ramps & curbs with RW & prosthesis modified independent.     Time  3    Period  Months    Status  On-going    Target Date  11/21/18            Plan - 10/28/18 1800    Clinical Impression Statement  Patient is experiencing increased hand pain which is limiting standing & gait tolerance. She only met 1 STG this week & 1 last week due to hand pain limiting her.     Rehab Potential  Good    Clinical Impairments Affecting Rehab Potential  pt unable  to read / write and sister who is caregiver reports limited ability to read / write    PT Frequency  1x / week    PT Duration  12 weeks    PT Treatment/Interventions  ADLs/Self Care Home Management;DME Instruction;Gait training;Stair training;Functional mobility training;Therapeutic activities;Therapeutic exercise;Balance training;Neuromuscular re-education;Patient/family education;Prosthetic Training;Passive range of motion;Vestibular;Canalith Repostioning    PT Next Visit Plan  check how new socket is fittning & instruct with any changes. work towards [place] with knee locked. ask about toileting.     Consulted and Agree with Plan of Care  Patient;Family member/caregiver    Family Member Consulted  Nephew in law- Greg       Patient will benefit from skilled therapeutic intervention in order to improve the following deficits and impairments:  Abnormal gait, Decreased activity tolerance, Decreased balance, Decreased coordination, Decreased endurance, Decreased knowledge of use of DME, Decreased mobility, Decreased range of motion, Decreased scar mobility, Decreased strength, Impaired flexibility, Postural dysfunction, Obesity, Prosthetic Dependency  Visit Diagnosis: Muscle weakness (generalized)  Unsteadiness on feet  Other abnormalities of gait and mobility  Abnormal posture  Pain in right hand  Stiffness of left hip, not elsewhere classified     Problem List Patient Active Problem List   Diagnosis Date Noted  . Pain in finger of  right hand 02/20/2018  . Atherosclerosis of native arteries of the extremities with gangrene ([place]) 11/01/2017  . Unilateral AKA, left ([place])   . Post-operative pain   . OSA (obstructive sleep apnea)   . Benign essential HTN   . Diabetes mellitus type 2 in obese ([place])   . Hyperkalemia   . Leukocytosis   . Acute blood loss anemia   . Amputation stump infection ([place]) 08/23/2017  . Amputation of left lower extremity above knee upon examination ([place])  08/23/2017  . PAD (peripheral artery disease) ([place]) 07/23/2017  . Cellulitis 06/10/2017  . Hyponatremia 06/10/2017  . Fever 06/10/2017  . Rash 05/24/2017  . Diabetes ([place])   . HTN (hypertension)   . Allergic rhinitis   . Insomnia   . Depression     Kolleen Ochsner PT, DPT 10/29/2018, 6:50 AM  [facility] [address] [place], [place], [address] Phone: [phone]   Fax:  [phone]  Name: CHELLSEA BECKERS MRN: [id number] Date of Birth: [birth year]

## 2018-10-30 DIAGNOSIS — M9901 Segmental and somatic dysfunction of cervical region: Secondary | ICD-10-CM | POA: Diagnosis not present

## 2018-10-30 DIAGNOSIS — M47819 Spondylosis without myelopathy or radiculopathy, site unspecified: Secondary | ICD-10-CM | POA: Diagnosis not present

## 2018-10-30 DIAGNOSIS — M50122 Cervical disc disorder at C5-C6 level with radiculopathy: Secondary | ICD-10-CM | POA: Diagnosis not present

## 2018-10-30 DIAGNOSIS — M5384 Other specified dorsopathies, thoracic region: Secondary | ICD-10-CM | POA: Diagnosis not present

## 2018-10-30 DIAGNOSIS — M9902 Segmental and somatic dysfunction of thoracic region: Secondary | ICD-10-CM | POA: Diagnosis not present

## 2018-11-04 ENCOUNTER — Encounter: Payer: Medicare HMO | Admitting: Physical Therapy

## 2018-11-04 DIAGNOSIS — Z89612 Acquired absence of left leg above knee: Secondary | ICD-10-CM | POA: Diagnosis not present

## 2018-11-04 DIAGNOSIS — T8189XA Other complications of procedures, not elsewhere classified, initial encounter: Secondary | ICD-10-CM | POA: Diagnosis not present

## 2018-11-04 DIAGNOSIS — E1121 Type 2 diabetes mellitus with diabetic nephropathy: Secondary | ICD-10-CM | POA: Diagnosis not present

## 2018-11-04 DIAGNOSIS — R296 Repeated falls: Secondary | ICD-10-CM | POA: Diagnosis not present

## 2018-11-04 DIAGNOSIS — S88012A Complete traumatic amputation at knee level, left lower leg, initial encounter: Secondary | ICD-10-CM | POA: Diagnosis not present

## 2018-11-04 DIAGNOSIS — Z89619 Acquired absence of unspecified leg above knee: Secondary | ICD-10-CM | POA: Diagnosis not present

## 2018-11-11 ENCOUNTER — Encounter (HOSPITAL_COMMUNITY): Payer: Self-pay | Admitting: Emergency Medicine

## 2018-11-11 ENCOUNTER — Encounter: Payer: Medicare HMO | Admitting: Physical Therapy

## 2018-11-11 ENCOUNTER — Ambulatory Visit (INDEPENDENT_AMBULATORY_CARE_PROVIDER_SITE_OTHER): Payer: Medicare HMO

## 2018-11-11 ENCOUNTER — Ambulatory Visit: Payer: Medicare HMO | Admitting: Sports Medicine

## 2018-11-11 ENCOUNTER — Inpatient Hospital Stay (HOSPITAL_COMMUNITY)
Admission: EM | Admit: 2018-11-11 | Discharge: 2018-11-22 | DRG: 854 | Disposition: A | Discharge status: Home Health | Payer: Medicare HMO | Source: Ambulatory Visit | Attending: Internal Medicine | Admitting: Internal Medicine

## 2018-11-11 ENCOUNTER — Emergency Department (HOSPITAL_COMMUNITY): Payer: Medicare HMO

## 2018-11-11 ENCOUNTER — Other Ambulatory Visit: Payer: Self-pay

## 2018-11-11 ENCOUNTER — Encounter: Payer: Self-pay | Admitting: Sports Medicine

## 2018-11-11 DIAGNOSIS — A4153 Sepsis due to Serratia: Secondary | ICD-10-CM | POA: Diagnosis present

## 2018-11-11 DIAGNOSIS — L7622 Postprocedural hemorrhage and hematoma of skin and subcutaneous tissue following other procedure: Secondary | ICD-10-CM | POA: Diagnosis not present

## 2018-11-11 DIAGNOSIS — M869 Osteomyelitis, unspecified: Secondary | ICD-10-CM | POA: Diagnosis present

## 2018-11-11 DIAGNOSIS — IMO0002 Reserved for concepts with insufficient information to code with codable children: Secondary | ICD-10-CM | POA: Diagnosis present

## 2018-11-11 DIAGNOSIS — L97514 Non-pressure chronic ulcer of other part of right foot with necrosis of bone: Secondary | ICD-10-CM

## 2018-11-11 DIAGNOSIS — E785 Hyperlipidemia, unspecified: Secondary | ICD-10-CM | POA: Diagnosis present

## 2018-11-11 DIAGNOSIS — E1165 Type 2 diabetes mellitus with hyperglycemia: Secondary | ICD-10-CM | POA: Diagnosis present

## 2018-11-11 DIAGNOSIS — I739 Peripheral vascular disease, unspecified: Secondary | ICD-10-CM | POA: Diagnosis present

## 2018-11-11 DIAGNOSIS — I96 Gangrene, not elsewhere classified: Secondary | ICD-10-CM

## 2018-11-11 DIAGNOSIS — I10 Essential (primary) hypertension: Secondary | ICD-10-CM | POA: Diagnosis not present

## 2018-11-11 DIAGNOSIS — E119 Type 2 diabetes mellitus without complications: Secondary | ICD-10-CM | POA: Diagnosis not present

## 2018-11-11 DIAGNOSIS — F5105 Insomnia due to other mental disorder: Secondary | ICD-10-CM | POA: Diagnosis present

## 2018-11-11 DIAGNOSIS — Z6834 Body mass index (BMI) 34.0-34.9, adult: Secondary | ICD-10-CM | POA: Diagnosis not present

## 2018-11-11 DIAGNOSIS — Z881 Allergy status to other antibiotic agents status: Secondary | ICD-10-CM | POA: Diagnosis not present

## 2018-11-11 DIAGNOSIS — E1169 Type 2 diabetes mellitus with other specified complication: Secondary | ICD-10-CM | POA: Diagnosis present

## 2018-11-11 DIAGNOSIS — I70269 Atherosclerosis of native arteries of extremities with gangrene, unspecified extremity: Secondary | ICD-10-CM | POA: Diagnosis present

## 2018-11-11 DIAGNOSIS — Z8249 Family history of ischemic heart disease and other diseases of the circulatory system: Secondary | ICD-10-CM | POA: Diagnosis not present

## 2018-11-11 DIAGNOSIS — Z87891 Personal history of nicotine dependence: Secondary | ICD-10-CM | POA: Diagnosis not present

## 2018-11-11 DIAGNOSIS — E1365 Other specified diabetes mellitus with hyperglycemia: Secondary | ICD-10-CM

## 2018-11-11 DIAGNOSIS — I70261 Atherosclerosis of native arteries of extremities with gangrene, right leg: Secondary | ICD-10-CM | POA: Diagnosis present

## 2018-11-11 DIAGNOSIS — Z89612 Acquired absence of left leg above knee: Secondary | ICD-10-CM

## 2018-11-11 DIAGNOSIS — Z79899 Other long term (current) drug therapy: Secondary | ICD-10-CM | POA: Diagnosis not present

## 2018-11-11 DIAGNOSIS — I119 Hypertensive heart disease without heart failure: Secondary | ICD-10-CM | POA: Diagnosis present

## 2018-11-11 DIAGNOSIS — B9689 Other specified bacterial agents as the cause of diseases classified elsewhere: Secondary | ICD-10-CM | POA: Diagnosis not present

## 2018-11-11 DIAGNOSIS — Z7982 Long term (current) use of aspirin: Secondary | ICD-10-CM

## 2018-11-11 DIAGNOSIS — A419 Sepsis, unspecified organism: Secondary | ICD-10-CM | POA: Diagnosis present

## 2018-11-11 DIAGNOSIS — I998 Other disorder of circulatory system: Secondary | ICD-10-CM | POA: Diagnosis present

## 2018-11-11 DIAGNOSIS — M868X7 Other osteomyelitis, ankle and foot: Secondary | ICD-10-CM | POA: Diagnosis not present

## 2018-11-11 DIAGNOSIS — F419 Anxiety disorder, unspecified: Secondary | ICD-10-CM | POA: Diagnosis present

## 2018-11-11 DIAGNOSIS — E1152 Type 2 diabetes mellitus with diabetic peripheral angiopathy with gangrene: Secondary | ICD-10-CM | POA: Diagnosis present

## 2018-11-11 DIAGNOSIS — F329 Major depressive disorder, single episode, unspecified: Secondary | ICD-10-CM | POA: Diagnosis present

## 2018-11-11 DIAGNOSIS — Z89421 Acquired absence of other right toe(s): Secondary | ICD-10-CM | POA: Diagnosis not present

## 2018-11-11 DIAGNOSIS — Z794 Long term (current) use of insulin: Secondary | ICD-10-CM | POA: Diagnosis not present

## 2018-11-11 DIAGNOSIS — E08621 Diabetes mellitus due to underlying condition with foot ulcer: Secondary | ICD-10-CM | POA: Diagnosis not present

## 2018-11-11 DIAGNOSIS — D62 Acute posthemorrhagic anemia: Secondary | ICD-10-CM | POA: Diagnosis not present

## 2018-11-11 DIAGNOSIS — Z89512 Acquired absence of left leg below knee: Secondary | ICD-10-CM | POA: Diagnosis not present

## 2018-11-11 DIAGNOSIS — E11621 Type 2 diabetes mellitus with foot ulcer: Secondary | ICD-10-CM | POA: Diagnosis present

## 2018-11-11 DIAGNOSIS — M96831 Postprocedural hemorrhage and hematoma of a musculoskeletal structure following other procedure: Secondary | ICD-10-CM | POA: Diagnosis present

## 2018-11-11 DIAGNOSIS — R58 Hemorrhage, not elsewhere classified: Secondary | ICD-10-CM | POA: Diagnosis not present

## 2018-11-11 DIAGNOSIS — F17211 Nicotine dependence, cigarettes, in remission: Secondary | ICD-10-CM | POA: Diagnosis not present

## 2018-11-11 DIAGNOSIS — E1351 Other specified diabetes mellitus with diabetic peripheral angiopathy without gangrene: Secondary | ICD-10-CM | POA: Diagnosis present

## 2018-11-11 DIAGNOSIS — E669 Obesity, unspecified: Secondary | ICD-10-CM | POA: Diagnosis present

## 2018-11-11 DIAGNOSIS — Z7902 Long term (current) use of antithrombotics/antiplatelets: Secondary | ICD-10-CM | POA: Diagnosis not present

## 2018-11-11 DIAGNOSIS — A498 Other bacterial infections of unspecified site: Secondary | ICD-10-CM | POA: Diagnosis not present

## 2018-11-11 DIAGNOSIS — T8789 Other complications of amputation stump: Secondary | ICD-10-CM | POA: Diagnosis not present

## 2018-11-11 DIAGNOSIS — Z91011 Allergy to milk products: Secondary | ICD-10-CM | POA: Diagnosis not present

## 2018-11-11 DIAGNOSIS — S78112A Complete traumatic amputation at level between left hip and knee, initial encounter: Secondary | ICD-10-CM

## 2018-11-11 DIAGNOSIS — E1151 Type 2 diabetes mellitus with diabetic peripheral angiopathy without gangrene: Secondary | ICD-10-CM | POA: Diagnosis not present

## 2018-11-11 DIAGNOSIS — R52 Pain, unspecified: Secondary | ICD-10-CM | POA: Diagnosis not present

## 2018-11-11 LAB — CBC WITH DIFFERENTIAL/PLATELET
Abs Immature Granulocytes: 0.36 10*3/uL — ABNORMAL HIGH (ref 0.00–0.07)
Basophils Absolute: 0.1 10*3/uL (ref 0.0–0.1)
Basophils Relative: 1 %
EOS PCT: 2 %
Eosinophils Absolute: 0.2 10*3/uL (ref 0.0–0.5)
HEMATOCRIT: 41.2 % (ref 36.0–46.0)
HEMOGLOBIN: 13.3 g/dL (ref 12.0–15.0)
Immature Granulocytes: 3 %
LYMPHS PCT: 10 %
Lymphs Abs: 1.5 10*3/uL (ref 0.7–4.0)
MCH: 28.4 pg (ref 26.0–34.0)
MCHC: 32.3 g/dL (ref 30.0–36.0)
MCV: 87.8 fL (ref 80.0–100.0)
MONO ABS: 0.8 10*3/uL (ref 0.1–1.0)
MONOS PCT: 5 %
Neutro Abs: 11.5 10*3/uL — ABNORMAL HIGH (ref 1.7–7.7)
Neutrophils Relative %: 79 %
Platelets: 240 10*3/uL (ref 150–400)
RBC: 4.69 MIL/uL (ref 3.87–5.11)
RDW: 13.2 % (ref 11.5–15.5)
WBC: 14.4 10*3/uL — AB (ref 4.0–10.5)
nRBC: 0 % (ref 0.0–0.2)

## 2018-11-11 LAB — COMPREHENSIVE METABOLIC PANEL
ALT: 13 U/L (ref 0–44)
AST: 12 U/L — AB (ref 15–41)
Albumin: 3.2 g/dL — ABNORMAL LOW (ref 3.5–5.0)
Alkaline Phosphatase: 85 U/L (ref 38–126)
Anion gap: 12 (ref 5–15)
BUN: 16 mg/dL (ref 8–23)
CHLORIDE: 91 mmol/L — AB (ref 98–111)
CO2: 26 mmol/L (ref 22–32)
CREATININE: 0.89 mg/dL (ref 0.44–1.00)
Calcium: 9.3 mg/dL (ref 8.9–10.3)
GFR calc Af Amer: >60 mL/min (ref >60)
Glucose, Bld: 406 mg/dL — ABNORMAL HIGH (ref 70–99)
Potassium: 4.7 mmol/L (ref 3.5–5.1)
Sodium: 129 mmol/L — ABNORMAL LOW (ref 135–145)
Total Bilirubin: 0.6 mg/dL (ref 0.3–1.2)
Total Protein: 7.4 g/dL (ref 6.5–8.1)

## 2018-11-11 LAB — LACTIC ACID, PLASMA: Lactic Acid, Venous: 2.9 mmol/L (ref 0.5–1.9)

## 2018-11-11 LAB — URINALYSIS, ROUTINE W REFLEX MICROSCOPIC
Bacteria, UA: NONE SEEN
Bilirubin Urine: NEGATIVE
Glucose, UA: >=500 mg/dL — AB
Ketones, ur: NEGATIVE mg/dL
Nitrite: NEGATIVE
PROTEIN: NEGATIVE mg/dL
Specific Gravity, Urine: 1.022 (ref 1.005–1.030)
pH: 6 (ref 5.0–8.0)

## 2018-11-11 MED ORDER — PIPERACILLIN-TAZOBACTAM 3.375 G IVPB 30 MIN
3.3750 g | Freq: Once | INTRAVENOUS | Status: AC
  Administered 2018-11-11: 3.375 g via INTRAVENOUS
  Filled 2018-11-11: qty 50

## 2018-11-11 MED ORDER — SODIUM CHLORIDE 0.9 % IV BOLUS (SEPSIS)
1000.0000 mL | Freq: Once | INTRAVENOUS | Status: AC
  Administered 2018-11-11: 1000 mL via INTRAVENOUS

## 2018-11-11 MED ORDER — SODIUM CHLORIDE 0.9 % IV SOLN
1000.0000 mL | INTRAVENOUS | Status: DC
  Administered 2018-11-11: 1000 mL via INTRAVENOUS

## 2018-11-11 MED ORDER — SODIUM CHLORIDE 0.9 % IV BOLUS (SEPSIS)
1000.0000 mL | Freq: Once | INTRAVENOUS | Status: AC
  Administered 2018-11-12: 1000 mL via INTRAVENOUS

## 2018-11-11 MED ORDER — VANCOMYCIN HCL IN DEXTROSE 1-5 GM/200ML-% IV SOLN
1000.0000 mg | Freq: Once | INTRAVENOUS | Status: AC
  Administered 2018-11-11: 1000 mg via INTRAVENOUS
  Filled 2018-11-11: qty 200

## 2018-11-11 NOTE — ED Notes (Signed)
Rad in room 

## 2018-11-11 NOTE — ED Provider Notes (Addendum)
[facility] EMERGENCY DEPARTMENT Provider Note   CSN: [id number] Arrival date & time: 11/11/18  1819     History   Chief Complaint Chief Complaint  Patient presents with  . toe infection    HPI Danielle Buchanan is a 63 y.o. female.  The history is provided by the patient and medical records. No language interpreter was used.     63 year old female with significant history of diabetes, peripheral artery disease, diabetic foot ulcer resulting AKA left sent here from [facility] with concerns of foot infection. Patient had her right fourth toe amputation on 11/04/2017 by [name] for necrotic right fourth toe.  A month ago patient has a dry scab on the dorsum of her right third toe.  It has been monitored however within the past week she reports worsening skin changes and oral affected toe.  She also endorsed fever, chills, and generalized weakness.  She notes that her blood sugar is now well controlled despite taking the medication as prescribed.  She does not complain of any significant pain to the affected area but admits that she has neuropathy.  She did recall accidentally bumping her toe against a hard surface several weeks prior.  She has had her toe amputated by [name] in the past.  She was seen by her foot specialist today, states an x-ray was obtained and was told that there is bony involvement.  Patient recommended to come to ER for further care. Pt is UTD with tetanus.   Past Medical History:  Diagnosis Date  . Allergic rhinitis   . Depression   . Diabetes (HCC)   . Dyslipidemia   . HTN (hypertension)   . Insomnia   . OSA (obstructive sleep apnea)    no cpap  . Urinary incontinence     Patient Active Problem List   Diagnosis Date Noted  . Pain in finger of right hand 02/20/2018  . Atherosclerosis of native arteries of the extremities with gangrene (HCC) 11/01/2017  . Unilateral AKA, left (HCC)   . Post-operative pain   . OSA  (obstructive sleep apnea)   . Benign essential HTN   . Diabetes mellitus type 2 in obese (HCC)   . Hyperkalemia   . Leukocytosis   . Acute blood loss anemia   . Amputation stump infection (HCC) 08/23/2017  . Amputation of left lower extremity above knee upon examination (HCC) 08/23/2017  . PAD (peripheral artery disease) (HCC) 07/23/2017  . Cellulitis 06/10/2017  . Hyponatremia 06/10/2017  . Fever 06/10/2017  . Rash 05/24/2017  . Diabetes (HCC)   . HTN (hypertension)   . Allergic rhinitis   . Insomnia   . Depression     Past Surgical History:  Procedure Laterality Date  . ABDOMINAL AORTOGRAM W/LOWER EXTREMITY N/A 06/24/2017   Procedure: ABDOMINAL AORTOGRAM W/LOWER EXTREMITY;  Surgeon: Maeola Harman, MD;  Location: [facility] INVASIVE CV LAB;  Service: Cardiovascular;  Laterality: N/A;  . ABDOMINAL AORTOGRAM W/LOWER EXTREMITY Right 10/09/2017   Procedure: ABDOMINAL AORTOGRAM W/LOWER EXTREMITY;  Surgeon: Maeola Harman, MD;  Location: [facility] INVASIVE CV LAB;  Service: Cardiovascular;  Laterality: Right;  . AMPUTATION Left 07/23/2017   Procedure: AMPUTATION  BELOW KNEE;  Surgeon: Maeola Harman, MD;  Location: [facility] OR;  Service: Vascular;  Laterality: Left;  . AMPUTATION Left 08/12/2017   Procedure: REVISION BELOW KNEE;  Surgeon: Maeola Harman, MD;  Location: [facility] OR;  Service: Vascular;  Laterality: Left;  . AMPUTATION Left  08/27/2017   Procedure: left ABOVE KNEE AMPUTATION;  Surgeon: Maeola Harman, MD;  Location: [facility] OR;  Service: Vascular;  Laterality: Left;  . AMPUTATION Left 08/30/2017   Procedure: REVISION AMPUTATION ABOVE KNEE;  Surgeon: Nada Libman, MD;  Location: [facility] OR;  Service: Vascular;  Laterality: Left;  . AMPUTATION Right 11/04/2017   Procedure: AMPUTATION RIGHT FOURTH TOE;  Surgeon: Maeola Harman, MD;  Location: [facility] OR;  Service: Vascular;  Laterality: Right;  . APPLICATION OF WOUND VAC Left 08/27/2017   Procedure:  APPLICATION OF WOUND VAC;  Surgeon: Maeola Harman, MD;  Location: [facility] OR;  Service: Vascular;  Laterality: Left;  . CARPAL TUNNEL RELEASE Right   . COLONOSCOPY    . LOWER EXTREMITY INTERVENTION Right 10/09/2017   Procedure: LOWER EXTREMITY INTERVENTION;  Surgeon: Maeola Harman, MD;  Location: [facility] INVASIVE CV LAB;  Service: Cardiovascular;  Laterality: Right;     OB History   No obstetric history on file.      Home Medications    Prior to Admission medications   Medication Sig Start Date End Date Taking? Authorizing Provider  amitriptyline (ELAVIL) 25 MG tablet Take 25 mg by mouth at bedtime.    [provider]  aspirin (ASPIRIN 81) 81 MG chewable tablet Aspir-81    [provider]  aspirin EC 325 MG tablet Take 325 mg by mouth daily.    [provider]  insulin NPH-regular Human (NOVOLIN 70/30) (70-30) 100 UNIT/ML injection 120 units with breakfast, and 90 units with supper, and syringes 3/day. 03/11/18   [name], MD  losartan (COZAAR) 100 MG tablet Take 100 mg by mouth daily.    [provider]  Melatonin 10 MG TABS Take 20 mg by mouth at bedtime.    [provider]  nicotine polacrilex (NICORELIEF) 2 MG gum Take 2 mg by mouth as needed for smoking cessation.    [provider]  pravastatin (PRAVACHOL) 20 MG tablet Take 20 mg by mouth daily.     [provider]  sertraline (ZOLOFT) 100 MG tablet Take 150 mg by mouth daily.     [provider]  traZODone (DESYREL) 50 MG tablet Take 50 mg by mouth at bedtime.    [provider]  TRUE METRIX BLOOD GLUCOSE TEST test strip USE TO CHECK YOUR BLOOD SUGAR TWICE A DAY (DX: E11.21) IN VITRO 90 DAYS 09/10/18   [provider]  VITAMIN E PO Take 180 mg by mouth daily.    [provider]  Zinc 50 MG TABS Take 50 mg by mouth daily.    [provider]    Family History Family History  Problem Relation Age of Onset  .  Heart attack Mother   . Alcohol abuse Mother   . Heart attack Father   . Alcohol abuse Father   . Heart attack Sister   . Heart attack Brother   . Hypercholesterolemia Sister   . Diabetes Neg Hx     Social History Social History   Tobacco Use  . Smoking status: Light Tobacco Smoker    Packs/day: 1.00    Years: 51.00    Pack years: 51.00    Types: Cigarettes    Last attempt to quit: 06/08/2017    Years since quitting: 1.4  . Smokeless tobacco: Never Used  Substance Use Topics  . Alcohol use: No    Alcohol/week: 0.0 standard drinks  . Drug use: No     Allergies  Milk-related compounds and Doxycycline   Review of Systems Review of Systems  All other systems reviewed and are negative.    Physical Exam Updated Vital Signs BP (!) 132/59 (BP Location: Right Arm)   Pulse 84   Temp 98.2 F (36.8 C) (Oral)   Resp 18   SpO2 97%   Physical Exam Vitals signs and nursing note reviewed.  Constitutional:      General: She is not in acute distress.    Appearance: She is well-developed. She is obese.  HENT:     Head: Atraumatic.  Eyes:     Conjunctiva/sclera: Conjunctivae normal.  Neck:     Musculoskeletal: Neck supple.  Cardiovascular:     Rate and Rhythm: Tachycardia present.     Pulses: Normal pulses.     Heart sounds: Normal heart sounds.  Pulmonary:     Effort: Pulmonary effort is normal.     Breath sounds: Normal breath sounds.  Abdominal:     General: Abdomen is flat.     Palpations: Abdomen is soft.     Tenderness: There is no abdominal tenderness.  Musculoskeletal:     Comments: Right foot: Necrotic appearing third toe involving most of the toe with evidence of dry gangrene and surrounding erythema involving the midfoot.  Difficult to palpate dorsalis pedis pulse.  2 mm circular necrotic skin changes noted to lateral aspects of right great toe.  Skin:    Findings: No rash.  Neurological:     Mental Status: She is alert.          ED Treatments  / Results  Labs (all labs ordered are listed, but only abnormal results are displayed) Labs Reviewed  LACTIC ACID, PLASMA - Abnormal; Notable for the following components:      Result Value   Lactic Acid, Venous 2.9 (*)    All other components within normal limits  COMPREHENSIVE METABOLIC PANEL - Abnormal; Notable for the following components:   Sodium 129 (*)    Chloride 91 (*)    Glucose, Bld 406 (*)    Albumin 3.2 (*)    AST 12 (*)    All other components within normal limits  CBC WITH DIFFERENTIAL/PLATELET - Abnormal; Notable for the following components:   WBC 14.4 (*)    Neutro Abs 11.5 (*)    Abs Immature Granulocytes 0.36 (*)    All other components within normal limits  URINALYSIS, ROUTINE W REFLEX MICROSCOPIC - Abnormal; Notable for the following components:   Glucose, UA >=500 (*)    Hgb urine dipstick SMALL (*)    Leukocytes, UA TRACE (*)    All other components within normal limits  CULTURE, BLOOD (ROUTINE X 2)  CULTURE, BLOOD (ROUTINE X 2)  PROCALCITONIN  PROTIME-INR  APTT    EKG None  ED ECG REPORT   Date: 11/11/2018  Rate: 91  Rhythm: normal sinus rhythm  QRS Axis: left  Intervals: normal  ST/T Wave abnormalities: normal  Conduction Disutrbances:none  Narrative Interpretation:   Old EKG Reviewed: unchanged  I have personally reviewed the EKG tracing and agree with the computerized printout as noted.   Radiology Dg Foot Complete Right  Result Date: 11/11/2018 Please see detailed radiograph report in office note.   Procedures .Critical Care Performed by: Fayrene Helperran, Omaria Plunk, PA-C Authorized by: Fayrene Helperran, Emiya Loomer, PA-C   Critical care provider statement:    Critical care time (minutes):  45   Critical care was time spent personally by me on the following activities:  Discussions  with consultants, evaluation of patient's response to treatment, examination of patient, ordering and performing treatments and interventions, ordering and review of laboratory  studies, ordering and review of radiographic studies, pulse oximetry, re-evaluation of patient's condition, obtaining history from patient or surrogate and review of old charts   (including critical care time)  Medications Ordered in ED Medications  sodium chloride 0.9 % bolus 1,000 mL (has no administration in time range)    And  sodium chloride 0.9 % bolus 1,000 mL (has no administration in time range)    And  sodium chloride 0.9 % bolus 1,000 mL (has no administration in time range)  vancomycin (VANCOCIN) IVPB 1000 mg/200 mL premix (0 mg Intravenous Stopped 11/11/18 2333)  piperacillin-tazobactam (ZOSYN) IVPB 3.375 g (0 g Intravenous Stopped 11/11/18 2333)     Initial Impression / Assessment and Plan / ED Course  I have reviewed the triage vital signs and the nursing notes.  Pertinent labs & imaging results that were available during my care of the patient were reviewed by me and considered in my medical decision making (see chart for details).     BP (!) 120/59   Pulse 95   Temp 98.2 F (36.8 C) (Oral)   Resp (!) 27   SpO2 93%    Final Clinical Impressions(s) / ED Diagnoses   Final diagnoses:  Gangrene of toe of right foot (HCC)  Diabetic ulcer of toe of right foot associated with diabetes mellitus due to underlying condition, with necrosis of bone (HCC)  Sepsis [facility](HCC)    ED Discharge Orders    None     10:16 PM Patient with history of peripheral vascular disease and uncontrolled diabetes who has had left AKA as well as right fourth great toe amputation by vascular surgeon Dr. Pascal LuxKane in the past.  She developed gangrene involving her right third toe with signs of infection.  She was told that she has bony involvement on recent x-ray by foot and ankle specialist today.  She came in with signs of increased lactic acid and white blood cells concerning for systemic infection however she is not hypotensive, no fever and not tachycardic.  Broad-spectrum antibiotic including  vancomycin and Zosyn.  I have consulted on-call vascular surgeon Dr.Bradham who request medicine admission and will see patient tomorrow.  11:35 PM Since pt has elevated lactic acid of 2.9, leukocytosis with WBC 14.4, with source of infection sepsis protocol initiated.  Will give fluid resuscitation at 9730ml/kg.  Elevated CBG of 406, normal anion gap.   11:55 PM Appreciate consultation from [facility] Dr. Julian ReilGardner who agrees to admit pt for further care.        Fayrene Helperran, Evander Macaraeg, PA-C 11/11/18 2357    Benjiman CorePickering, Nathan, MD 11/12/18 [phone]

## 2018-11-11 NOTE — Progress Notes (Signed)
Subjective: Danielle Buchanan is a 63 y.o. female patient seen in office for evaluation of ulceration of the right third toe. Patient has a history of diabetes and a blood glucose level was not recorded but has been running very high in the 3-400 range since toe has worsened.   Patient is changing the dressing using Silvadene at home with noted increased drainage dark tissue blistering and worsening swelling redness and infection.  Patient reports that this started as a scab after bumping her toe several weeks ago reports that the scab was present when she saw vascular on January 14 and at that time vascular stated that her circulation was good and that we will continue to monitor the scab however slowly the scab slowly worsened. Denies nausea/fever/vomiting/night sweats/shortness of breath.  Admits to chills and a little pain to the toe 3 out of 10.  Patient has no other pedal complaints at this time.  Patient Active Problem List   Diagnosis Date Noted  . Pain in finger of right hand 02/20/2018  . Atherosclerosis of native arteries of the extremities with gangrene (HCC) 11/01/2017  . Unilateral AKA, left (HCC)   . Post-operative pain   . OSA (obstructive sleep apnea)   . Benign essential HTN   . Diabetes mellitus type 2 in obese (HCC)   . Hyperkalemia   . Leukocytosis   . Acute blood loss anemia   . Amputation stump infection (HCC) 08/23/2017  . Amputation of left lower extremity above knee upon examination (HCC) 08/23/2017  . PAD (peripheral artery disease) (HCC) 07/23/2017  . Cellulitis 06/10/2017  . Hyponatremia 06/10/2017  . Fever 06/10/2017  . Rash 05/24/2017  . Diabetes (HCC)   . HTN (hypertension)   . Allergic rhinitis   . Insomnia   . Depression    No current facility-administered medications on file prior to visit.    Current Outpatient Medications on File Prior to Visit  Medication Sig Dispense Refill  . amitriptyline (ELAVIL) 25 MG tablet Take 25 mg by mouth at bedtime.     Marland Kitchen. aspirin (ASPIRIN 81) 81 MG chewable tablet Aspir-81    . aspirin EC 325 MG tablet Take 325 mg by mouth daily.    . insulin NPH-regular Human (NOVOLIN 70/30) (70-30) 100 UNIT/ML injection 120 units with breakfast, and 90 units with supper, and syringes 3/day. 210 mL 3  . losartan (COZAAR) 100 MG tablet Take 100 mg by mouth daily.    . Melatonin 10 MG TABS Take 20 mg by mouth at bedtime.    . nicotine polacrilex (NICORELIEF) 2 MG gum Take 2 mg by mouth as needed for smoking cessation.    . pravastatin (PRAVACHOL) 20 MG tablet Take 20 mg by mouth daily.     . sertraline (ZOLOFT) 100 MG tablet Take 150 mg by mouth daily.     . traZODone (DESYREL) 50 MG tablet Take 50 mg by mouth at bedtime.    . TRUE METRIX BLOOD GLUCOSE TEST test strip USE TO CHECK YOUR BLOOD SUGAR TWICE A DAY (DX: E11.21) IN VITRO 90 DAYS  3  . VITAMIN E PO Take 180 mg by mouth daily.    . Zinc 50 MG TABS Take 50 mg by mouth daily.     Allergies  Allergen Reactions  . Milk-Related Compounds     incontinence  . Doxycycline Rash    Recent Results (from the past 2160 hour(s))  Lactic acid, plasma     Status: Abnormal   Collection Time: 11/11/18  6:37 PM  Result Value Ref Range   Lactic Acid, Venous 2.9 (HH) 0.5 - 1.9 mmol/L    Comment: CRITICAL RESULT CALLED TO, READ BACK BY AND VERIFIED WITH: Margy Clarks MORRIS,RN 1930 11/11/2018 WBOND Performed at [facility], [address] [place], [place] [address]   Comprehensive metabolic panel     Status: Abnormal   Collection Time: 11/11/18  6:37 PM  Result Value Ref Range   Sodium 129 (L) 135 - 145 mmol/L   Potassium 4.7 3.5 - 5.1 mmol/L   Chloride 91 (L) 98 - 111 mmol/L   CO2 26 22 - 32 mmol/L   Glucose, Bld 406 (H) 70 - 99 mg/dL   BUN 16 8 - 23 mg/dL   Creatinine, Ser 6.040.89 0.44 - 1.00 mg/dL   Calcium 9.3 8.9 - 54.010.3 mg/dL   Total Protein 7.4 6.5 - 8.1 g/dL   Albumin 3.2 (L) 3.5 - 5.0 g/dL   AST 12 (L) 15 - 41 U/L   ALT 13 0 - 44 U/L   Alkaline Phosphatase 85 38 -  126 U/L   Total Bilirubin 0.6 0.3 - 1.2 mg/dL   GFR calc non Af Amer >60 >60 mL/min   GFR calc Af Amer >60 >60 mL/min   Anion gap 12 5 - 15    Comment: Performed at [facility], [address] [place], [place] [address]  CBC with Differential     Status: Abnormal   Collection Time: 11/11/18  6:37 PM  Result Value Ref Range   WBC 14.4 (H) 4.0 - 10.5 K/uL   RBC 4.69 3.87 - 5.11 MIL/uL   Hemoglobin 13.3 12.0 - 15.0 g/dL   HCT 14.741.2 82.936.0 - 56.246.0 %   MCV 87.8 80.0 - 100.0 fL   MCH 28.4 26.0 - 34.0 pg   MCHC 32.3 30.0 - 36.0 g/dL   RDW 13.013.2 86.511.5 - 78.415.5 %   Platelets 240 150 - 400 K/uL   nRBC 0.0 0.0 - 0.2 %   Neutrophils Relative % 79 %   Neutro Abs 11.5 (H) 1.7 - 7.7 K/uL   Lymphocytes Relative 10 %   Lymphs Abs 1.5 0.7 - 4.0 K/uL   Monocytes Relative 5 %   Monocytes Absolute 0.8 0.1 - 1.0 K/uL   Eosinophils Relative 2 %   Eosinophils Absolute 0.2 0.0 - 0.5 K/uL   Basophils Relative 1 %   Basophils Absolute 0.1 0.0 - 0.1 K/uL   Immature Granulocytes 3 %   Abs Immature Granulocytes 0.36 (H) 0.00 - 0.07 K/uL    Comment: Performed at [facility], [address] [place], [place] [address]  Urinalysis, Routine w reflex microscopic     Status: Abnormal   Collection Time: 11/11/18 10:46 PM  Result Value Ref Range   Color, Urine YELLOW YELLOW   APPearance CLEAR CLEAR   Specific Gravity, Urine 1.022 1.005 - 1.030   pH 6.0 5.0 - 8.0   Glucose, UA >=500 (A) NEGATIVE mg/dL   Hgb urine dipstick SMALL (A) NEGATIVE   Bilirubin Urine NEGATIVE NEGATIVE   Ketones, ur NEGATIVE NEGATIVE mg/dL   Protein, ur NEGATIVE NEGATIVE mg/dL   Nitrite NEGATIVE NEGATIVE   Leukocytes, UA TRACE (A) NEGATIVE   RBC / HPF 0-5 0 - 5 RBC/hpf   WBC, UA 21-50 0 - 5 WBC/hpf   Bacteria, UA NONE SEEN NONE SEEN   Squamous Epithelial / LPF 0-5 0 - 5   Budding Yeast PRESENT  Comment: Performed at [facility], [address] [place], [place] [address]    Objective: There were no vitals  filed for this visit.  General: Patient is awake, alert, oriented x 3 and in no acute distress.  Dermatology: There is a full-thickness ulceration noted to the dorsal aspect of the right third toe with significant necrosis and slough and palpable bone at the interphalangeal joint of the right third toe with plantar blistering and purple discoloration of the distal tuft of the right third toe with significant soft tissue swelling active bloody drainage no malodor however this is consistent with changes of early with gangrene.  There is a small abrasion at the right hallux with no surrounding signs of infection.   Vascular: Dorsalis pedis and posterior tibial pulse difficult to palpate on the right foot  Neurologic: Protective sensation severely diminished on the right foot.  Musculosketal: There is mild pain with palpation to right third toe with digital gangrene, status post left below-knee amputation  Xrays,Right foot:+ bony destruction suggestive of osteomyelitis at the middle phalanx of the right third toe. No gas in soft tissues.   No results for input(s): GRAMSTAIN, LABORGA in the last 8760 hours.  Assessment and Plan:  Problem List Items Addressed This Visit      Cardiovascular and Mediastinum   PAD (peripheral artery disease) (HCC)    Other Visit Diagnoses    Diabetic ulcer of toe of right foot associated with diabetes mellitus due to underlying condition, with necrosis of bone (HCC)    -  Primary   Relevant Orders   DG Foot Complete Right (Completed)   Toe gangrene (HCC)       Osteomyelitis of third toe of right foot (HCC)       Status post amputation of lesser toe of right foot (HCC)       Status post above-knee amputation of left lower extremity (HCC)           -Examined patient and discussed the progression of the wound and treatment alternatives. -Xrays reviewed -Cleansed ulceration at right third toe and then applied Betadine and dry sterile dressing -Patient  immediately sent by car to the emergency room for admission for IV antibiotics and for a urgent consult to [name] from vascular for reevaluation in the setting of wet gangrene of right third toe for possible vascular intervention and possible amputation advised patient that she is high risk and it is possible that she may require a more proximal amputation pending her vascular status -Discussed case with triage nurse at [facility], ER where patient was sent for admission -Dispensed a open toed postoperative shoe to use and advised patient to protect the toe when she is transferred burn from her wheelchair to bed -Patient to return to office for follow-up after discharge from hospital.  Asencion Islam, DPM

## 2018-11-11 NOTE — Patient Instructions (Signed)

## 2018-11-11 NOTE — ED Triage Notes (Addendum)
Pt sent from [facility] for diabetic ulcer of R 3rd toe with necrosis of bone.  Pt reports "infection" to R 3rd toe x 1 week.  Reports fever and chills that started today while at doctor's office.  Pt sent to ED for admission for IV antibiotics and vascular consult for possible intervention/amputation.

## 2018-11-11 NOTE — Consult Note (Addendum)
[facility] [place]  Patient name: Danielle Buchanan Utter MRN: [id number] DOB: July 29, 1956 Sex: female   REQUESTING PROVIDER:    ER   REASON FOR CONSULT:    Toe infection  HISTORY OF PRESENT ILLNESS:   Danielle Buchanan Bodley is a 63 y.o. female, who is status post left above knee amputation in 2018.  On 10-09-2017, she underwent PTA of the PTA for CLI.  On 11-04-2017, she had a right 4th toe amputation.  She was seen in the office on 10-21-2018 after injuring her right 3rd toe and the toe had an eschar but no infection.  At follow up with her podiatrist today and sent to the ED due to bone exposure.  SHe has been febrile with chills.  Code spesis was initiated.  The patient is a diabetic which is poorly controlled.  She is on a statin for hypercholesterolemia, and she takes a ARB for hypertension.  She is a smoker  PAST MEDICAL HISTORY    Past Medical History:  Diagnosis Date  . Allergic rhinitis   . Depression   . Diabetes (HCC)   . Dyslipidemia   . HTN (hypertension)   . Insomnia   . OSA (obstructive sleep apnea)    no cpap  . Urinary incontinence      FAMILY HISTORY   Family History  Problem Relation Age of Onset  . Heart attack Mother   . Alcohol abuse Mother   . Heart attack Father   . Alcohol abuse Father   . Heart attack Sister   . Heart attack Brother   . Hypercholesterolemia Sister   . Diabetes Neg Hx     SOCIAL HISTORY:   Social History   Socioeconomic History  . Marital status: Single    Spouse name: Not on file  . Number of children: Not on file  . Years of education: Not on file  . Highest education level: Not on file  Occupational History  . Not on file  Social Needs  . Financial resource strain: Not on file  . Food insecurity:    Worry: Not on file    Inability: Not on file  . Transportation needs:    Medical: Not on file    Non-medical: Not on file  Tobacco Use  . Smoking status: Light  Tobacco Smoker    Packs/day: 1.00    Years: 51.00    Pack years: 51.00    Types: Cigarettes    Last attempt to quit: 06/08/2017    Years since quitting: 1.4  . Smokeless tobacco: Never Used  Substance and Sexual Activity  . Alcohol use: No    Alcohol/week: 0.0 standard drinks  . Drug use: No  . Sexual activity: Not on file  Lifestyle  . Physical activity:    Days per week: Not on file    Minutes per session: Not on file  . Stress: Not on file  Relationships  . Social connections:    Talks on phone: Not on file    Gets together: Not on file    Attends religious service: Not on file    Active member of club or organization: Not on file    Attends meetings of clubs or organizations: Not on file    Relationship status: Not on file  . Intimate partner violence:    Fear of current or ex partner: Not on file    Emotionally abused: Not on file    Physically abused: Not on file  Forced sexual activity: Not on file  Other Topics Concern  . Not on file  Social History Narrative   Lives with sister in an apartment on the first floor.  Has no children.    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    Education: 3rd grade.    ALLERGIES:    Allergies  Allergen Reactions  . Milk-Related Compounds     incontinence  . Doxycycline Rash    CURRENT MEDICATIONS:    Current Facility-Administered Medications  Medication Dose Route Frequency Provider Last Rate Last Dose  . 0.9 %  sodium chloride infusion  1,000 mL Intravenous Continuous Fayrene Helper, PA-C 125 mL/hr at 11/11/18 2228 1,000 mL at 11/11/18 2228  . vancomycin (VANCOCIN) IVPB 1000 mg/200 mL premix  1,000 mg Intravenous Once Fayrene Helper, PA-C 200 mL/hr at 11/11/18 2230 1,000 mg at 11/11/18 2230   Current Outpatient Medications  Medication Sig Dispense Refill  . amitriptyline (ELAVIL) 25 MG tablet Take 25 mg by mouth at bedtime.    Marland Kitchen aspirin (ASPIRIN 81) 81 MG chewable tablet Aspir-81     . aspirin EC 325 MG tablet Take 325 mg by mouth daily.    . insulin NPH-regular Human (NOVOLIN 70/30) (70-30) 100 UNIT/ML injection 120 units with breakfast, and 90 units with supper, and syringes 3/day. 210 mL 3  . losartan (COZAAR) 100 MG tablet Take 100 mg by mouth daily.    . Melatonin 10 MG TABS Take 20 mg by mouth at bedtime.    . nicotine polacrilex (NICORELIEF) 2 MG gum Take 2 mg by mouth as needed for smoking cessation.    . pravastatin (PRAVACHOL) 20 MG tablet Take 20 mg by mouth daily.     . sertraline (ZOLOFT) 100 MG tablet Take 150 mg by mouth daily.     . traZODone (DESYREL) 50 MG tablet Take 50 mg by mouth at bedtime.    . TRUE METRIX BLOOD GLUCOSE TEST test strip USE TO CHECK YOUR BLOOD SUGAR TWICE A DAY (DX: E11.21) IN VITRO 90 DAYS  3  . VITAMIN E PO Take 180 mg by mouth daily.    . Zinc 50 MG TABS Take 50 mg by mouth daily.      REVIEW OF SYSTEMS:   [X]  denotes positive finding, [ ]  denotes negative finding Cardiac  Comments:  Chest pain or chest pressure:    Shortness of breath upon exertion:    Short of breath when lying flat:    Irregular heart rhythm:        Vascular    Pain in calf, thigh, or hip brought on by ambulation:    Pain in feet at night that wakes you up from your sleep:     Blood clot in your veins:    Leg swelling:         Pulmonary    Oxygen at home:    Productive cough:     Wheezing:         Neurologic    Sudden weakness in arms or legs:     Sudden numbness in arms or legs:     Sudden onset of difficulty speaking or slurred speech:    Temporary loss of vision in one eye:     Problems with dizziness:         Gastrointestinal    Blood in stool:      Vomited blood:         Genitourinary  Burning when urinating:     Blood in urine:        Psychiatric    Major depression:         Hematologic    Bleeding problems:    Problems with blood clotting too easily:        Skin    Rashes or ulcers:        Constitutional    Fever  or chills:     PHYSICAL EXAM:   Vitals:   11/11/18 1822 11/11/18 2232  BP: (!) 132/59 110/68  Pulse: 84 90  Resp: 18 18  Temp: 98.2 F (36.8 C)   TempSrc: Oral   SpO2: 97% 90%    GENERAL: The patient is a well-nourished female, in no acute distress. The vital signs are documented above. CARDIAC: There is a regular rate and rhythm.  VASCULAR: non palpable pedal pulses PULMONARY: Nonlabored respirations ABDOMEN: Soft and non-tender with normal pitched bowel sounds.  MUSCULOSKELETAL: There are no major deformities or cyanosis. NEUROLOGIC: No focal weakness or paresthesias are detected. SKIN: see photeo PSYCHIATRIC: The patient has a normal affect.      STUDIES:   none  ASSESSMENT and PLAN   Infected right 3rd toe.  The patient is going to need amputation at minimum of her right 3rd toe.  She will need arterial evaluation as well.  I will try to add her on the the OR schedule for angiogram and amputation of right toe 3, possible 2,3,5 today   [name], MD [facility] [phone] Pager [phone]

## 2018-11-12 ENCOUNTER — Encounter (HOSPITAL_COMMUNITY): Payer: Self-pay | Admitting: Certified Registered Nurse Anesthetist

## 2018-11-12 ENCOUNTER — Inpatient Hospital Stay (HOSPITAL_COMMUNITY): Payer: Medicare HMO | Admitting: Certified Registered Nurse Anesthetist

## 2018-11-12 ENCOUNTER — Encounter (HOSPITAL_COMMUNITY)
Admission: EM | Disposition: A | Discharge status: Home Health | Payer: Self-pay | Source: Ambulatory Visit | Attending: Internal Medicine

## 2018-11-12 DIAGNOSIS — E08621 Diabetes mellitus due to underlying condition with foot ulcer: Secondary | ICD-10-CM

## 2018-11-12 DIAGNOSIS — L97514 Non-pressure chronic ulcer of other part of right foot with necrosis of bone: Secondary | ICD-10-CM | POA: Diagnosis present

## 2018-11-12 DIAGNOSIS — S78112A Complete traumatic amputation at level between left hip and knee, initial encounter: Secondary | ICD-10-CM

## 2018-11-12 DIAGNOSIS — Z89421 Acquired absence of other right toe(s): Secondary | ICD-10-CM | POA: Diagnosis not present

## 2018-11-12 DIAGNOSIS — E1151 Type 2 diabetes mellitus with diabetic peripheral angiopathy without gangrene: Secondary | ICD-10-CM | POA: Diagnosis not present

## 2018-11-12 DIAGNOSIS — D62 Acute posthemorrhagic anemia: Secondary | ICD-10-CM | POA: Diagnosis not present

## 2018-11-12 DIAGNOSIS — E669 Obesity, unspecified: Secondary | ICD-10-CM

## 2018-11-12 DIAGNOSIS — B9689 Other specified bacterial agents as the cause of diseases classified elsewhere: Secondary | ICD-10-CM | POA: Diagnosis not present

## 2018-11-12 DIAGNOSIS — Z794 Long term (current) use of insulin: Secondary | ICD-10-CM | POA: Diagnosis not present

## 2018-11-12 DIAGNOSIS — Z6834 Body mass index (BMI) 34.0-34.9, adult: Secondary | ICD-10-CM | POA: Diagnosis not present

## 2018-11-12 DIAGNOSIS — M869 Osteomyelitis, unspecified: Secondary | ICD-10-CM | POA: Diagnosis present

## 2018-11-12 DIAGNOSIS — Z7902 Long term (current) use of antithrombotics/antiplatelets: Secondary | ICD-10-CM | POA: Diagnosis not present

## 2018-11-12 DIAGNOSIS — Z87891 Personal history of nicotine dependence: Secondary | ICD-10-CM | POA: Diagnosis not present

## 2018-11-12 DIAGNOSIS — I96 Gangrene, not elsewhere classified: Secondary | ICD-10-CM | POA: Diagnosis not present

## 2018-11-12 DIAGNOSIS — E119 Type 2 diabetes mellitus without complications: Secondary | ICD-10-CM | POA: Diagnosis not present

## 2018-11-12 DIAGNOSIS — I70261 Atherosclerosis of native arteries of extremities with gangrene, right leg: Secondary | ICD-10-CM | POA: Diagnosis present

## 2018-11-12 DIAGNOSIS — I119 Hypertensive heart disease without heart failure: Secondary | ICD-10-CM | POA: Diagnosis present

## 2018-11-12 DIAGNOSIS — E11621 Type 2 diabetes mellitus with foot ulcer: Secondary | ICD-10-CM | POA: Diagnosis present

## 2018-11-12 DIAGNOSIS — Z79899 Other long term (current) drug therapy: Secondary | ICD-10-CM | POA: Diagnosis not present

## 2018-11-12 DIAGNOSIS — Z8249 Family history of ischemic heart disease and other diseases of the circulatory system: Secondary | ICD-10-CM | POA: Diagnosis not present

## 2018-11-12 DIAGNOSIS — Z7982 Long term (current) use of aspirin: Secondary | ICD-10-CM | POA: Diagnosis not present

## 2018-11-12 DIAGNOSIS — E1165 Type 2 diabetes mellitus with hyperglycemia: Secondary | ICD-10-CM | POA: Diagnosis present

## 2018-11-12 DIAGNOSIS — E1169 Type 2 diabetes mellitus with other specified complication: Secondary | ICD-10-CM | POA: Diagnosis present

## 2018-11-12 DIAGNOSIS — I998 Other disorder of circulatory system: Secondary | ICD-10-CM | POA: Diagnosis present

## 2018-11-12 DIAGNOSIS — E1152 Type 2 diabetes mellitus with diabetic peripheral angiopathy with gangrene: Secondary | ICD-10-CM | POA: Diagnosis present

## 2018-11-12 DIAGNOSIS — F5105 Insomnia due to other mental disorder: Secondary | ICD-10-CM | POA: Diagnosis present

## 2018-11-12 DIAGNOSIS — A4153 Sepsis due to Serratia: Secondary | ICD-10-CM | POA: Diagnosis present

## 2018-11-12 DIAGNOSIS — Z89612 Acquired absence of left leg above knee: Secondary | ICD-10-CM | POA: Diagnosis not present

## 2018-11-12 DIAGNOSIS — E785 Hyperlipidemia, unspecified: Secondary | ICD-10-CM | POA: Diagnosis present

## 2018-11-12 DIAGNOSIS — M96831 Postprocedural hemorrhage and hematoma of a musculoskeletal structure following other procedure: Secondary | ICD-10-CM | POA: Diagnosis present

## 2018-11-12 DIAGNOSIS — F419 Anxiety disorder, unspecified: Secondary | ICD-10-CM | POA: Diagnosis present

## 2018-11-12 DIAGNOSIS — I10 Essential (primary) hypertension: Secondary | ICD-10-CM | POA: Diagnosis not present

## 2018-11-12 DIAGNOSIS — A419 Sepsis, unspecified organism: Secondary | ICD-10-CM | POA: Diagnosis present

## 2018-11-12 DIAGNOSIS — F329 Major depressive disorder, single episode, unspecified: Secondary | ICD-10-CM | POA: Diagnosis present

## 2018-11-12 HISTORY — PX: LOWER EXTREMITY ANGIOGRAM: SHX5508

## 2018-11-12 HISTORY — PX: TOE AMPUTATION: SHX809

## 2018-11-12 HISTORY — DX: Osteomyelitis, unspecified: M86.9

## 2018-11-12 HISTORY — PX: AMPUTATION: SHX166

## 2018-11-12 LAB — GLUCOSE, CAPILLARY
Glucose-Capillary: 435 mg/dL — ABNORMAL HIGH (ref 70–99)
Glucose-Capillary: 352 mg/dL — ABNORMAL HIGH (ref 70–99)
Glucose-Capillary: 227 mg/dL — ABNORMAL HIGH (ref 70–99)
Glucose-Capillary: 240 mg/dL — ABNORMAL HIGH (ref 70–99)
Glucose-Capillary: 229 mg/dL — ABNORMAL HIGH (ref 70–99)
Glucose-Capillary: 171 mg/dL — ABNORMAL HIGH (ref 70–99)
Glucose-Capillary: 191 mg/dL — ABNORMAL HIGH (ref 70–99)

## 2018-11-12 LAB — LACTIC ACID, PLASMA
Lactic Acid, Venous: 1.4 mmol/L (ref 0.5–1.9)
Lactic Acid, Venous: 1.3 mmol/L (ref 0.5–1.9)

## 2018-11-12 LAB — APTT: aPTT: 29 seconds (ref 24–36)

## 2018-11-12 LAB — HEMOGLOBIN A1C
Hgb A1c MFr Bld: 10.9 % — ABNORMAL HIGH (ref 4.8–5.6)
Mean Plasma Glucose: 266.13 mg/dL

## 2018-11-12 LAB — PROTIME-INR
INR: 1.07
Prothrombin Time: 13.8 seconds (ref 11.4–15.2)

## 2018-11-12 LAB — PREALBUMIN: Prealbumin: 9.3 mg/dL — ABNORMAL LOW (ref 18–38)

## 2018-11-12 LAB — MRSA PCR SCREENING: MRSA by PCR: NEGATIVE

## 2018-11-12 LAB — CBG MONITORING, ED: Glucose-Capillary: 421 mg/dL — ABNORMAL HIGH (ref 70–99)

## 2018-11-12 LAB — PROCALCITONIN: Procalcitonin: 0.11 ng/mL

## 2018-11-12 SURGERY — ANGIOGRAM, LOWER EXTREMITY
Anesthesia: General | Site: Leg Lower | Laterality: Right

## 2018-11-12 MED ORDER — NICOTINE POLACRILEX 2 MG MT GUM
2.0000 mg | CHEWING_GUM | Freq: Every day | OROMUCOSAL | Status: DC | PRN
  Filled 2018-11-12: qty 1

## 2018-11-12 MED ORDER — SODIUM CHLORIDE 0.9% FLUSH: 3.0000 mL | INTRAVENOUS | Status: DC | PRN

## 2018-11-12 MED ORDER — TRAZODONE HCL 50 MG PO TABS
50.0000 mg | ORAL_TABLET | Freq: Every day | ORAL | Status: DC
  Administered 2018-11-12 – 2018-11-21 (×10): 50 mg via ORAL
  Filled 2018-11-12 (×10): qty 1

## 2018-11-12 MED ORDER — ONDANSETRON HCL 4 MG/2ML IJ SOLN: 4.0000 mg | Freq: Four times a day (QID) | INTRAMUSCULAR | Status: DC | PRN

## 2018-11-12 MED ORDER — ACETAMINOPHEN 500 MG PO TABS
1000.0000 mg | ORAL_TABLET | Freq: Once | ORAL | Status: AC
  Administered 2018-11-12: 1000 mg via ORAL

## 2018-11-12 MED ORDER — LABETALOL HCL 5 MG/ML IV SOLN
10.0000 mg | INTRAVENOUS | Status: DC | PRN
  Filled 2018-11-12: qty 4

## 2018-11-12 MED ORDER — LACTATED RINGERS IV SOLN
INTRAVENOUS | Status: DC
  Administered 2018-11-12: 14:00:00 via INTRAVENOUS

## 2018-11-12 MED ORDER — ONDANSETRON HCL 4 MG/2ML IJ SOLN
INTRAMUSCULAR | Status: AC
  Filled 2018-11-12: qty 2

## 2018-11-12 MED ORDER — FENTANYL CITRATE (PF) 250 MCG/5ML IJ SOLN
INTRAMUSCULAR | Status: AC
  Filled 2018-11-12: qty 5

## 2018-11-12 MED ORDER — INSULIN NPH (HUMAN) (ISOPHANE) 100 UNIT/ML ~~LOC~~ SUSP
30.0000 [IU] | Freq: Two times a day (BID) | SUBCUTANEOUS | Status: DC
  Filled 2018-11-12: qty 10

## 2018-11-12 MED ORDER — SODIUM CHLORIDE 0.9 % IV SOLN
2.0000 g | INTRAVENOUS | Status: DC
  Administered 2018-11-12 – 2018-11-14 (×3): 2 g via INTRAVENOUS
  Filled 2018-11-12 (×3): qty 20

## 2018-11-12 MED ORDER — ACETAMINOPHEN 325 MG PO TABS
650.0000 mg | ORAL_TABLET | ORAL | Status: DC | PRN
  Administered 2018-11-13: 650 mg via ORAL
  Filled 2018-11-12: qty 2

## 2018-11-12 MED ORDER — SODIUM CHLORIDE 0.9 % IV SOLN
INTRAVENOUS | Status: DC | PRN
  Administered 2018-11-12: 20 ug/min via INTRAVENOUS

## 2018-11-12 MED ORDER — PHENYLEPHRINE 40 MCG/ML (10ML) SYRINGE FOR IV PUSH (FOR BLOOD PRESSURE SUPPORT)
PREFILLED_SYRINGE | INTRAVENOUS | Status: AC
  Filled 2018-11-12: qty 10

## 2018-11-12 MED ORDER — HYDROMORPHONE HCL 1 MG/ML IJ SOLN: 0.2500 mg | INTRAMUSCULAR | Status: DC | PRN

## 2018-11-12 MED ORDER — SODIUM CHLORIDE 0.9 % WEIGHT BASED INFUSION
1.0000 mL/kg/h | INTRAVENOUS | Status: AC
  Administered 2018-11-12: 1 mL/kg/h via INTRAVENOUS

## 2018-11-12 MED ORDER — INSULIN ASPART 100 UNIT/ML ~~LOC~~ SOLN: 0.0000 [IU] | SUBCUTANEOUS | Status: DC

## 2018-11-12 MED ORDER — LOSARTAN POTASSIUM 50 MG PO TABS
100.0000 mg | ORAL_TABLET | Freq: Every day | ORAL | Status: DC
  Administered 2018-11-12 – 2018-11-22 (×10): 100 mg via ORAL
  Filled 2018-11-12 (×11): qty 2

## 2018-11-12 MED ORDER — 0.9 % SODIUM CHLORIDE (POUR BTL) OPTIME
TOPICAL | Status: DC | PRN
  Administered 2018-11-12: 1000 mL

## 2018-11-12 MED ORDER — SODIUM CHLORIDE 0.9 % IV SOLN
250.0000 mL | INTRAVENOUS | Status: DC | PRN
  Administered 2018-11-13: 250 mL via INTRAVENOUS

## 2018-11-12 MED ORDER — SODIUM CHLORIDE (PF) 0.9 % IJ SOLN
INTRAVENOUS | Status: DC | PRN
  Administered 2018-11-12: 303.6 mL via INTRAMUSCULAR

## 2018-11-12 MED ORDER — INSULIN NPH (HUMAN) (ISOPHANE) 100 UNIT/ML ~~LOC~~ SUSP: 30.0000 [IU] | Freq: Two times a day (BID) | SUBCUTANEOUS | Status: DC

## 2018-11-12 MED ORDER — SODIUM CHLORIDE 0.9 % IV SOLN
INTRAVENOUS | Status: DC | PRN
  Administered 2018-11-12: 500 mL

## 2018-11-12 MED ORDER — SODIUM CHLORIDE 0.9 % IV SOLN
INTRAVENOUS | Status: AC
  Filled 2018-11-12: qty 1.2

## 2018-11-12 MED ORDER — PHENYLEPHRINE HCL 10 MG/ML IJ SOLN
INTRAMUSCULAR | Status: DC | PRN
  Administered 2018-11-12: 120 ug via INTRAVENOUS

## 2018-11-12 MED ORDER — AMITRIPTYLINE HCL 25 MG PO TABS
25.0000 mg | ORAL_TABLET | Freq: Every day | ORAL | Status: DC
  Administered 2018-11-12 – 2018-11-21 (×10): 25 mg via ORAL
  Filled 2018-11-12 (×10): qty 1

## 2018-11-12 MED ORDER — PROPOFOL 10 MG/ML IV BOLUS
INTRAVENOUS | Status: AC
  Filled 2018-11-12: qty 20

## 2018-11-12 MED ORDER — ASPIRIN EC 81 MG PO TBEC
81.0000 mg | DELAYED_RELEASE_TABLET | Freq: Every day | ORAL | Status: DC
  Administered 2018-11-13 – 2018-11-22 (×9): 81 mg via ORAL
  Filled 2018-11-12 (×9): qty 1

## 2018-11-12 MED ORDER — SERTRALINE HCL 100 MG PO TABS
100.0000 mg | ORAL_TABLET | Freq: Every day | ORAL | Status: DC
  Administered 2018-11-12 – 2018-11-22 (×10): 100 mg via ORAL
  Filled 2018-11-12 (×11): qty 1

## 2018-11-12 MED ORDER — PHENYLEPHRINE HCL 10 MG/ML IJ SOLN
INTRAMUSCULAR | Status: AC
  Filled 2018-11-12: qty 1

## 2018-11-12 MED ORDER — MIDAZOLAM HCL 2 MG/2ML IJ SOLN
INTRAMUSCULAR | Status: DC | PRN
  Administered 2018-11-12: 2 mg via INTRAVENOUS

## 2018-11-12 MED ORDER — METRONIDAZOLE 500 MG PO TABS
500.0000 mg | ORAL_TABLET | Freq: Three times a day (TID) | ORAL | Status: DC
  Administered 2018-11-12 – 2018-11-14 (×6): 500 mg via ORAL
  Filled 2018-11-12 (×6): qty 1

## 2018-11-12 MED ORDER — ONDANSETRON HCL 4 MG/2ML IJ SOLN
INTRAMUSCULAR | Status: DC | PRN
  Administered 2018-11-12: 4 mg via INTRAVENOUS

## 2018-11-12 MED ORDER — ACETAMINOPHEN 325 MG PO TABS: 650.0000 mg | ORAL_TABLET | Freq: Four times a day (QID) | ORAL | Status: DC | PRN

## 2018-11-12 MED ORDER — PROPOFOL 10 MG/ML IV BOLUS
INTRAVENOUS | Status: DC | PRN
  Administered 2018-11-12: 100 mg via INTRAVENOUS

## 2018-11-12 MED ORDER — MIDAZOLAM HCL 2 MG/2ML IJ SOLN
INTRAMUSCULAR | Status: AC
  Filled 2018-11-12: qty 2

## 2018-11-12 MED ORDER — HYDRALAZINE HCL 20 MG/ML IJ SOLN: 5.0000 mg | INTRAMUSCULAR | Status: DC | PRN

## 2018-11-12 MED ORDER — INSULIN GLARGINE 100 UNIT/ML ~~LOC~~ SOLN
50.0000 [IU] | Freq: Every day | SUBCUTANEOUS | Status: DC
  Administered 2018-11-12 – 2018-11-15 (×3): 50 [IU] via SUBCUTANEOUS
  Filled 2018-11-12 (×4): qty 0.5

## 2018-11-12 MED ORDER — HEPARIN SODIUM (PORCINE) 1000 UNIT/ML IJ SOLN
INTRAMUSCULAR | Status: DC | PRN
  Administered 2018-11-12: 2000 [IU] via INTRAVENOUS
  Administered 2018-11-12: 10000 [IU] via INTRAVENOUS

## 2018-11-12 MED ORDER — PROPOFOL 1000 MG/100ML IV EMUL
INTRAVENOUS | Status: AC
  Filled 2018-11-12: qty 100

## 2018-11-12 MED ORDER — FENTANYL CITRATE (PF) 100 MCG/2ML IJ SOLN
INTRAMUSCULAR | Status: DC | PRN
  Administered 2018-11-12 (×2): 25 ug via INTRAVENOUS

## 2018-11-12 MED ORDER — IODIXANOL 320 MG/ML IV SOLN
INTRAVENOUS | Status: DC | PRN
  Administered 2018-11-12: 50 mL via INTRAVENOUS

## 2018-11-12 MED ORDER — LIDOCAINE HCL (CARDIAC) PF 100 MG/5ML IV SOSY
PREFILLED_SYRINGE | INTRAVENOUS | Status: DC | PRN
  Administered 2018-11-12: 60 mg via INTRAVENOUS

## 2018-11-12 MED ORDER — ACETAMINOPHEN 650 MG RE SUPP: 650.0000 mg | Freq: Four times a day (QID) | RECTAL | Status: DC | PRN

## 2018-11-12 MED ORDER — LACTATED RINGERS IV SOLN
INTRAVENOUS | Status: DC | PRN
  Administered 2018-11-12: 14:00:00 via INTRAVENOUS

## 2018-11-12 MED ORDER — CLOPIDOGREL BISULFATE 75 MG PO TABS
75.0000 mg | ORAL_TABLET | Freq: Every day | ORAL | Status: DC
  Administered 2018-11-13 – 2018-11-22 (×9): 75 mg via ORAL
  Filled 2018-11-12 (×9): qty 1

## 2018-11-12 MED ORDER — SODIUM CHLORIDE 0.9% FLUSH
3.0000 mL | Freq: Two times a day (BID) | INTRAVENOUS | Status: DC
  Administered 2018-11-13 – 2018-11-22 (×10): 3 mL via INTRAVENOUS

## 2018-11-12 MED ORDER — ACETAMINOPHEN 500 MG PO TABS
ORAL_TABLET | ORAL | Status: AC
  Administered 2018-11-12: 1000 mg via ORAL
  Filled 2018-11-12: qty 2

## 2018-11-12 MED ORDER — ONDANSETRON HCL 4 MG PO TABS: 4.0000 mg | ORAL_TABLET | Freq: Four times a day (QID) | ORAL | Status: DC | PRN

## 2018-11-12 MED ORDER — INSULIN ASPART 100 UNIT/ML ~~LOC~~ SOLN
0.0000 [IU] | SUBCUTANEOUS | Status: DC
  Administered 2018-11-12: 20 [IU] via SUBCUTANEOUS
  Administered 2018-11-12: 7 [IU] via SUBCUTANEOUS
  Administered 2018-11-12: 4 [IU] via SUBCUTANEOUS
  Administered 2018-11-13: 3 [IU] via SUBCUTANEOUS
  Administered 2018-11-13 (×4): 4 [IU] via SUBCUTANEOUS
  Administered 2018-11-13: 3 [IU] via SUBCUTANEOUS
  Administered 2018-11-14: 4 [IU] via SUBCUTANEOUS
  Administered 2018-11-14: 11 [IU] via SUBCUTANEOUS

## 2018-11-12 MED ORDER — PRAVASTATIN SODIUM 40 MG PO TABS
40.0000 mg | ORAL_TABLET | Freq: Every day | ORAL | Status: DC
  Administered 2018-11-12 – 2018-11-22 (×10): 40 mg via ORAL
  Filled 2018-11-12 (×10): qty 1

## 2018-11-12 SURGICAL SUPPLY — 90 items
BAG BANDED W/RUBBER/TAPE 36X54 (MISCELLANEOUS) ×4 IMPLANT
BAG SNAP BAND KOVER 36X36 (MISCELLANEOUS) ×4 IMPLANT
BALLN STERLING OTW 3X150X150 (BALLOONS) ×4
BALLOON STERLING OTW 3X150X150 (BALLOONS) ×2 IMPLANT
BANDAGE ACE 4X5 VEL STRL LF (GAUZE/BANDAGES/DRESSINGS) ×4 IMPLANT
BANDAGE ACE 6X5 VEL STRL LF (GAUZE/BANDAGES/DRESSINGS) IMPLANT
BLADE LONG MED 31MMX9MM (MISCELLANEOUS)
BLADE LONG MED 31X9 (MISCELLANEOUS) IMPLANT
BLADE OSCILLATING /SAGITTAL (BLADE) ×4 IMPLANT
BLADE SAW SGTL 81X20 HD (BLADE) ×4 IMPLANT
BLADE SURG 10 STRL SS (BLADE) ×4 IMPLANT
BLADE SURG 11 STRL SS (BLADE) ×4 IMPLANT
BNDG GAUZE ELAST 4 BULKY (GAUZE/BANDAGES/DRESSINGS) ×4 IMPLANT
CANISTER SUCT 3000ML PPV (MISCELLANEOUS) ×4 IMPLANT
CATH ANGIO 5F BER2 65CM (CATHETERS) IMPLANT
CATH BEACON 5 .035 65 KMP TIP (CATHETERS) ×4 IMPLANT
CATH OMNI FLUSH .035X70CM (CATHETERS) IMPLANT
CATH OMNI FLUSH 5F 65CM (CATHETERS) ×4 IMPLANT
CHLORAPREP W/TINT 26ML (MISCELLANEOUS) IMPLANT
CLIP VESOCCLUDE MED 6/CT (CLIP) IMPLANT
CLOSURE MYNX CONTROL 6F/7F (Vascular Products) ×4 IMPLANT
COVER DOME SNAP 22 D (MISCELLANEOUS) ×4 IMPLANT
COVER PROBE W GEL 5X96 (DRAPES) ×4 IMPLANT
COVER SURGICAL LIGHT HANDLE (MISCELLANEOUS) ×4 IMPLANT
COVER WAND RF STERILE (DRAPES) ×4 IMPLANT
DERMABOND ADHESIVE PROPEN (GAUZE/BANDAGES/DRESSINGS) ×2
DERMABOND ADVANCED (GAUZE/BANDAGES/DRESSINGS) ×4
DERMABOND ADVANCED .7 DNX12 (GAUZE/BANDAGES/DRESSINGS) ×4 IMPLANT
DERMABOND ADVANCED .7 DNX6 (GAUZE/BANDAGES/DRESSINGS) ×2 IMPLANT
DEVICE TORQUE H2O (MISCELLANEOUS) IMPLANT
DRAPE EXTREMITY T 121X128X90 (DISPOSABLE) ×4 IMPLANT
DRAPE FEMORAL ANGIO 80X135IN (DRAPES) ×4 IMPLANT
DRAPE HALF SHEET 40X57 (DRAPES) ×4 IMPLANT
DRAPE X-RAY CASS 24X20 (DRAPES) IMPLANT
DRESSING OPSITE X SMALL 2X3 (GAUZE/BANDAGES/DRESSINGS) ×8 IMPLANT
DRSG ADAPTIC 3X8 NADH LF (GAUZE/BANDAGES/DRESSINGS) ×4 IMPLANT
DRSG VAC ATS SM SENSATRAC (GAUZE/BANDAGES/DRESSINGS) ×8 IMPLANT
ELECT CAUTERY BLADE 6.4 (BLADE) ×4 IMPLANT
ELECT REM PT RETURN 9FT ADLT (ELECTROSURGICAL) ×4
ELECTRODE REM PT RTRN 9FT ADLT (ELECTROSURGICAL) ×2 IMPLANT
GAUZE 4X4 16PLY RFD (DISPOSABLE) ×8 IMPLANT
GAUZE SPONGE 4X4 12PLY STRL (GAUZE/BANDAGES/DRESSINGS) ×4 IMPLANT
GAUZE SPONGE 4X4 12PLY STRL LF (GAUZE/BANDAGES/DRESSINGS) ×4 IMPLANT
GLOVE BIOGEL PI IND STRL 7.5 (GLOVE) ×2 IMPLANT
GLOVE BIOGEL PI INDICATOR 7.5 (GLOVE) ×2
GLOVE SURG SS PI 7.5 STRL IVOR (GLOVE) ×8 IMPLANT
GOWN STRL REUS W/ TWL LRG LVL3 (GOWN DISPOSABLE) ×4 IMPLANT
GOWN STRL REUS W/ TWL XL LVL3 (GOWN DISPOSABLE) ×2 IMPLANT
GOWN STRL REUS W/TWL LRG LVL3 (GOWN DISPOSABLE) ×4
GOWN STRL REUS W/TWL XL LVL3 (GOWN DISPOSABLE) ×2
GUIDEWIRE ANGLED .035X150CM (WIRE) IMPLANT
KIT BASIN OR (CUSTOM PROCEDURE TRAY) ×4 IMPLANT
KIT ENCORE 26 ADVANTAGE (KITS) ×4 IMPLANT
KIT TURNOVER KIT B (KITS) ×4 IMPLANT
NEEDLE PERC 18GX7CM (NEEDLE) ×4 IMPLANT
NS IRRIG 1000ML POUR BTL (IV SOLUTION) ×4 IMPLANT
PACK ENDO MINOR (CUSTOM PROCEDURE TRAY) ×4 IMPLANT
PACK GENERAL/GYN (CUSTOM PROCEDURE TRAY) IMPLANT
PACK SURGICAL SETUP 50X90 (CUSTOM PROCEDURE TRAY) IMPLANT
PAD ARMBOARD 7.5X6 YLW CONV (MISCELLANEOUS) ×8 IMPLANT
PENCIL BUTTON HOLSTER BLD 10FT (ELECTRODE) ×4 IMPLANT
PROTECTION STATION PRESSURIZED (MISCELLANEOUS) ×4
SET MICROPUNCTURE 5F STIFF (MISCELLANEOUS) ×4 IMPLANT
SHEATH AVANTI 11CM 5FR (SHEATH) IMPLANT
SHEATH HIGHFLEX ANSEL 6FRX55 (SHEATH) ×4 IMPLANT
SHEATH PINNACLE 5F 10CM (SHEATH) ×4 IMPLANT
SHEATH PINNACLE 6F 10CM (SHEATH) ×4 IMPLANT
SHIELD RADPAD SCOOP 12X17 (MISCELLANEOUS) ×4 IMPLANT
SPONGE GAUZE 2X2 8PLY STER LF (GAUZE/BANDAGES/DRESSINGS) ×2
SPONGE GAUZE 2X2 8PLY STRL LF (GAUZE/BANDAGES/DRESSINGS) ×6 IMPLANT
STATION PROTECTION PRESSURIZED (MISCELLANEOUS) ×2 IMPLANT
STOPCOCK MORSE 400PSI 3WAY (MISCELLANEOUS) ×8 IMPLANT
SUT BONE WAX W31G (SUTURE) IMPLANT
SUT ETHILON 3 0 PS 1 (SUTURE) ×8 IMPLANT
SYR 10ML LL (SYRINGE) ×12 IMPLANT
SYR 20CC LL (SYRINGE) ×4 IMPLANT
SYR 30ML LL (SYRINGE) ×4 IMPLANT
SYR BULB IRRIGATION 50ML (SYRINGE) ×4 IMPLANT
SYR MEDRAD MARK V 150ML (SYRINGE) ×4 IMPLANT
SYRINGE 3CC LL L/F (MISCELLANEOUS) ×4 IMPLANT
TOWEL GREEN STERILE (TOWEL DISPOSABLE) ×8 IMPLANT
TOWEL GREEN STERILE FF (TOWEL DISPOSABLE) ×4 IMPLANT
TUBE CONNECTING 12'X1/4 (SUCTIONS) ×1
TUBE CONNECTING 12X1/4 (SUCTIONS) ×3 IMPLANT
TUBING HIGH PRESSURE 120CM (CONNECTOR) ×8 IMPLANT
UNDERPAD 30X30 (UNDERPADS AND DIAPERS) ×4 IMPLANT
WATER STERILE IRR 1000ML POUR (IV SOLUTION) ×4 IMPLANT
WIRE BENTSON .035X145CM (WIRE) ×8 IMPLANT
WIRE SPARTACORE .014X300CM (WIRE) ×4 IMPLANT
WND VAC CANISTER 500ML (MISCELLANEOUS) ×4 IMPLANT

## 2018-11-12 NOTE — Progress Notes (Signed)
Pt arrived to the unit. VS WDL. CBG 435. [facility] Danielle Buchanan is notified. Per MD to administer 20 units of Novolog; recheck it in hour

## 2018-11-12 NOTE — Plan of Care (Signed)

## 2018-11-12 NOTE — Consult Note (Signed)
WOC Nurse wound consult note Reason for Consult: Necrotic toe on right foot Wound type:Infectious Consult placed simultaneously with consult for Vascular Surgery.  Dr.Brabham saw yesterday and will perform angio today and follow with minimally an amputation of the affected digit. There is no role for WOC nursing at this time.  WOC nursing team will not follow, but will remain available to this patient, the nursing and medical teams.  Please re-consult if needed. Thanks, Ladona Mow, MSN, RN, GNP, [place]  Pager# [phone]

## 2018-11-12 NOTE — Transfer of Care (Signed)
Immediate Anesthesia Transfer of Care Note  Patient: Danielle Buchanan  Procedure(s) Performed: Rosalin Hawking OF Right LEG (Right Leg Lower) AMPUTATION OF second, third and fifth toes (Right Foot)  Patient Location: PACU  Anesthesia Type:General  Level of Consciousness: awake and alert   Airway & Oxygen Therapy: Patient Spontanous Breathing and Patient connected to nasal cannula oxygen  Post-op Assessment: Report given to RN and Post -op Vital signs reviewed and stable  Post vital signs: Reviewed and stable  Last Vitals:  Vitals Value Taken Time  BP 125/67 11/12/2018  5:08 PM  Temp    Pulse 72 11/12/2018  5:10 PM  Resp 22 11/12/2018  5:11 PM  SpO2 98 % 11/12/2018  5:10 PM  Vitals shown include unvalidated device data.  Last Pain:  Vitals:   11/12/18 1158  TempSrc: Oral  PainSc:          Complications: No apparent anesthesia complications

## 2018-11-12 NOTE — Op Note (Signed)
Patient name: Danielle ContesJanice M Buchanan MRN: [id number] DOB: 1955/11/19 Sex: female  11/12/2018 Pre-operative Diagnosis: Ischemic, infected right third toe Post-operative diagnosis:  Same Surgeon:  Durene CalWells Tahj Lindseth Procedure Performed:  1.  Ultrasound-guided access, left femoral artery  2.  Abdominal aortogram  3.  Right lower extremity runoff  4.  Angioplasty, right tibioperoneal trunk and peroneal artery  5.  Closure device (Mynx)  6.  Transmetatarsal amputation, toes 2 through 5  7.  Placement of transmetatarsal amputation site wound VAC  Specimen: Cultures of the infected toe were sent to microbiology.  Findings: No bleeding at the third toe amputation site which necessitated proceeding with transmetatarsal amputation of toes 2 through 5.  At this level there was moderate capillary bleeding.  Deep space infection was encountered.  All necrotic tissue was removed.   Indications: The patient presented to the hospital last night with a several week history of a gangrenous right third toe.  She was now having fevers and foul odor.  She comes in today for angiography and toe amputation.  I discussed with the patient that she potentially could end up losing toes to 3 and 5.  Procedure:  The patient was identified in the holding area and taken to room 16.  The patient was then placed supine on the table and prepped and draped in the usual sterile fashion.  A time out was called. ultrasound was used to evaluate the left common femoral artery.  It was patent .  A digital ultrasound image was acquired.  A micropuncture needle was used to access the left common femoral artery under ultrasound guidance.  An 018 wire was advanced without resistance and a micropuncture sheath was placed.  The 018 wire was removed and a benson wire was placed.  The micropuncture sheath was exchanged for a 5 french sheath.  An omniflush catheter was advanced over the wire to the level of L-1.  An abdominal angiogram was obtained.   Next, using the omniflush catheter and a benson wire, the aortic bifurcation was crossed and the catheter was placed into theright external iliac artery and right runoff was obtained.    Findings:   Aortogram: No significant renal artery stenosis was identified.  Bilateral common and external iliac arteries are widely patent.  Right Lower Extremity: The right common femoral and profundofemoral artery are widely patent.  The superficial femoral-popliteal artery showed mild atherosclerotic changes without hemodynamically significant stenosis.  The dominant runoff was through the peroneal artery which had several areas of stenosis greater than 60%.  Left Lower Extremity: Not evaluated, left above-knee amputation  Intervention: After the above images were acquired the decision made to proceed with intervention.  A 6 French 55 cm high flex Ansell sheath was inserted into the right superficial femoral artery.  The patient was fully heparinized.  I used a 014 [place] core wire with the support of a 3 x 150 Sterling balloon to gain access into the peroneal artery.  The wire was advanced out onto the foot.  Balloon angioplasty of the peroneal artery was performed throughout its entire length requiring 2 separate inflations.  Each inflation was to nominal pressure for 2 minutes.  Follow-up imaging revealed resolution of the stenosis with the exception of a irregular area in the proximal peroneal artery.  This was likely a small focal dissection.  I treated this again with balloon angioplasty for 1 minute with a 3 mm balloon and this resolved.  At this point, the sheaths and wires were  removed and a minx device was used for closure of the arteriotomy.  Attention was then turned towards the infected necrotic right third toe.  A racquet type incision was made at the base of the third toe with a 10 blade.  This was used to cut down to the bone and a bone cutter was used to remove the toe.  Cultures were taken of the toe  and sent to microbiology.  There was no capillary bleeding at this level.  In order to get to more proximal portion of the foot I ended up removing toes 2 and 5 5, using a fishmouth incision.  A reciprocating saw was used to transect the metatarsal bone.  There was a deep space infection and necrotic tissue on the posterior side of the skin flap.  A 10 blade was used to remove all necrotic tissue.  At this point there did appear to be moderate capillary bleeding.  A rasp was used to smooth the bone surfaces, which were healthy.  The wound was then irrigated.  I loosely reapproximated the skin edges with interrupted 3-0 nylon sutures and a wound VAC was placed.  There were no immediate complications.  The patient was extubated and taken recovery in stable condition.  Impression:  #1  Successful balloon angioplasty of several greater than 60% peroneal artery stenosis with no residual stenosis.  A 3 mm balloon was performed.  #2  Transmetatarsal amputation was performed of toes 2 through 5.  There was moderate capillary bleeding.  There was a deep space infection which was sharply debrided with a 10 blade to remove all necrotic tissue.  The skin was loosely reapproximated and a wound VAC was placed.     Danielle Buchanan, M.D. [facility] [place] Office: [phone] Pager:  [phone]

## 2018-11-12 NOTE — Progress Notes (Signed)
Attending MD note  Patient was seen, examined,treatment plan was discussed with the PA-S.  I have personally reviewed the clinical findings, lab, imaging studies and management of this patient in detail. I agree with the documentation, as recorded by the PA-Student  Danielle Buchanan is a 63 y.o. year old female with medical history significant for DM2, HTN, PAD s/p L AKA in 2018, R 4th toe amputation who presented on 11/11/2018 with concern for grossly infected/necrotic third right toe after podiatrist outpatient visit and was found to have necrotic third right toe with concerns for osteomyelitis.Marland Kitchen  Physical: Vitals:   11/12/18 1014 11/12/18 1158  BP: 119/65 (!) 117/57  Pulse: 78 78  Resp: 17 16  Temp: 98.2 F (36.8 C) 98.6 F (37 C)  SpO2: 94% 94%    Constitutional:normal appearing female, no acute distress Eyes: EOMI, anicteric, normal conjunctivae ENMT: Oropharynx with moist mucous membranes Cardiovascular: RRR no MRGs, with no peripheral edema Respiratory: Normal respiratory effort on room air, clear breath sounds  Abdomen: Soft,non-tender, normal bowel sounds Skin: Necrotic third right digit with no obvious drainage or surrounding erythema Neurologic: Grossly no focal neuro deficit. Psychiatric:Appropriate affect, and mood. Mental status AAOx3  Plan  Necrotic right third toe.  Patient with poorly controlled diabetes very concerning for diabetic foot ulcer, x-ray still pending but high concern for osteomyelitis.  Patient has gone to the OR for amputation on 2/5.  Patient did present with leukocytosis and slight lactic acidosis but remained afebrile.  Continue empiric antibiotics and await further recommendations pending surgery evaluation.  Type 2 diabetes, poorly controlled.  A1c greater than 10.  On quite a bit of insulin at home.  Her Lantus was reduced prior to surgery to prevent any hypoglycemia, will closely monitor CBG with sliding scale and  reduce Lantus dose, will increase as needed.   Hypertension, stable.  Continue home losartan.  Hyperlipidemia, stable.  Continue pravastatin  Depression, stable.  Continue Zoloft and Elavil. Rest as above  Laverna Peace [facility]      PROGRESS NOTE        PATIENT DETAILS Name: Danielle Buchanan Age: 63 y.o. Sex: female Date of Birth: [birth year] Admit Date: 11/11/2018 Admitting Physician Hillary Bow, DO ZOX:WRUE, Okey Regal, MD  Brief Narrative: Patient is a 63 y.o. female is a 63 y/o female with hx poorly controlled T2DM, HTN, depression, and dyslipidemia presents with a black ulceration of the 3rd toe on her right foot.  Pt states that roughly 2 weeks ago, she stubbed her toe at home and suffered a subsequent wound. Over time, the wound grew in size and became black. It grew large enough that she went to see her podiatrist yesterday, Dr Danielle Buchanan. She was transferred immediately to [facility] emergency room for further management.    ED course:  Foot XR showed extensive deformity of the phalangeal bones CBC showed an elevated WBC (14.4)  Blood glucose - 406 Lactic acid 2.9  Pt received vancomycin and zozyn for antibiotic coverage as well as IV fluids and insulin. She was later admitted with a working diagnosis of osteomyelitis of the 3rd right toe.     Subjective:  Today, she is not complaining of any pain. Afebrile. She appears calm and exhibits no signs of acute distress.  She furthermore reports:  No chest pain No SOB No headache No Nausea  vomiting or diarrhea  Assessment/Plan: Principal Problem:  Osteomyelitis of third toe of right foot, complicated by atherosclerosis of native arteries with gangrene:   Significant tissue necrosis has occurred. Infection spreading to surrounding tissues. Vascular surgery has been consulted and they recommend amputation of the 3rd toe. They furthermore recommend an arterial evaluation and may end up amputating the 2nd and 5th toe  as well, as the infection appears to be spreading. This will likely take place this afternoon.    Patient stated that she had been feeling feverish before coming to the hospital. Her lactic acid was also critically high at 2.9 in the ED. Today, she is afebrile and her lactic acid has dropped to 1.3. We will continue Flagyl and IV rocephin while she waits for surgery.   Active Problems:    HTN (hypertension): Her BP has been stable during her stay. Continue losartan.     Diabetes mellitus type 2 in obese (HCC): [facility] 405 [facility]. Continue insulin administration. Her A1C was 10.9 today, so before discharge we will arrange for an outpatient follow up to help her control her T2DM better.     Depression: Her mood is appropriate given her situation. Continue amitriptyline, sertraline, and trazodone.   Morning labs/Imaging ordered:  A1C, procalcitonin, PT/INR, Lactic acid  DVT Prophylaxis: SCDs as tolerated  Code Status: Full code  Family Communication: None  Disposition Plan: Remain inpatient awaiting surgical amputation of 3rd right toe.    Antimicrobial agents: Anti-infectives (From admission, onward)   Start     Dose/Rate Route Frequency Ordered Stop   11/12/18 0800  cefTRIAXone (ROCEPHIN) 2 g in sodium chloride 0.9 % 100 mL IVPB     2 g 200 mL/hr over 30 Minutes Intravenous Every 24 hours 11/12/18 0243     11/12/18 0600  metroNIDAZOLE (FLAGYL) tablet 500 mg     500 mg Oral Every 8 hours 11/12/18 0243     11/11/18 2145  vancomycin (VANCOCIN) IVPB 1000 mg/200 mL premix     1,000 mg 200 mL/hr over 60 Minutes Intravenous  Once 11/11/18 2132 11/11/18 2333   11/11/18 2145  piperacillin-tazobactam (ZOSYN) IVPB 3.375 g     3.375 g 100 mL/hr over 30 Minutes Intravenous  Once 11/11/18 2132 11/11/18 2333       CONSULTS:  vascular surgery  MEDICATIONS: Scheduled Meds: . amitriptyline  25 mg Oral QHS  . insulin aspart  0-20 Units Subcutaneous Q4H  .  insulin glargine  50 Units Subcutaneous Daily  . losartan  100 mg Oral Daily  . metroNIDAZOLE  500 mg Oral Q8H  . pravastatin  40 mg Oral Daily  . sertraline  100 mg Oral Daily  . traZODone  50 mg Oral QHS   Continuous Infusions: . cefTRIAXone (ROCEPHIN)  IV 2 g (11/12/18 0922)   PRN Meds:.acetaminophen **OR** acetaminophen, nicotine polacrilex, ondansetron **OR** ondansetron (ZOFRAN) IV   PHYSICAL EXAM: Vital signs: Vitals:   11/12/18 0330 11/12/18 0345 11/12/18 0425 11/12/18 1014  BP: (!) 112/59 (!) 114/57 137/68 119/65  Pulse: 81 80 82 78  Resp: (!) 22 (!) 21 18 17   Temp:   98.4 F (36.9 C) 98.2 F (36.8 C)  TempSrc:   Oral Oral  SpO2: 97% 97% 99% 94%   There were no vitals filed for this visit. There is no height or weight on file to calculate BMI.   General appearance :Awake, alert, not in any distress. Speech Clear. Not toxic Looking HEENT: Atraumatic and  Normocephalic Resp:Good air entry bilaterally, no added sounds  CVS: S1 S2 regular, no murmurs.  GI: Bowel sounds present, Non tender and not distended with no gaurding, rigidity or rebound.No organomegaly Extremities: Left leg amputated above the knee.    Neurology:  speech clear,Non focal, sensation is grossly intact. Psychiatric: Normal judgment and insight. Alert and oriented x 3. Normal mood. Musculoskeletal:No digital cyanosis Skin:No Rash, warm and dry Wounds: Gangrenous lesion seen on R 3rd toe.   I have personally reviewed following labs and imaging studies  LABORATORY DATA: CBC: Recent Labs  Lab 11/11/18 1837  WBC 14.4*  NEUTROABS 11.5*  HGB 13.3  HCT 41.2  MCV 87.8  PLT 240    Basic Metabolic Panel: Recent Labs  Lab 11/11/18 1837  NA 129*  K 4.7  CL 91*  CO2 26  GLUCOSE 406*  BUN 16  CREATININE 0.89  CALCIUM 9.3    GFR: CrCl cannot be calculated (Unknown ideal weight.).  Liver Function Tests: Recent Labs  Lab 11/11/18 1837  AST 12*  ALT 13  ALKPHOS 85  BILITOT 0.6    PROT 7.4  ALBUMIN 3.2*   No results for input(s): LIPASE, AMYLASE in the last 168 hours. No results for input(s): AMMONIA in the last 168 hours.  Coagulation Profile: Recent Labs  Lab 11/12/18 0057  INR 1.07    Cardiac Enzymes: No results for input(s): CKTOTAL, CKMB, CKMBINDEX, TROPONINI in the last 168 hours.  BNP (last 3 results) No results for input(s): PROBNP in the last 8760 hours.  HbA1C: Recent Labs    11/12/18 0257  HGBA1C 10.9*    CBG: Recent Labs  Lab 11/12/18 0348 11/12/18 0415 11/12/18 0543 11/12/18 0820  GLUCAP 421* 435* 352* 227*    Lipid Profile: No results for input(s): CHOL, HDL, LDLCALC, TRIG, CHOLHDL, LDLDIRECT in the last 72 hours.  Thyroid Function Tests: No results for input(s): TSH, T4TOTAL, FREET4, T3FREE, THYROIDAB in the last 72 hours.  Anemia Panel: No results for input(s): VITAMINB12, FOLATE, FERRITIN, TIBC, IRON, RETICCTPCT in the last 72 hours.  Urine analysis:    Component Value Date/Time   COLORURINE YELLOW 11/11/2018 2246   APPEARANCEUR CLEAR 11/11/2018 2246   LABSPEC 1.022 11/11/2018 2246   PHURINE 6.0 11/11/2018 2246   GLUCOSEU >=500 (A) 11/11/2018 2246   HGBUR SMALL (A) 11/11/2018 2246   BILIRUBINUR NEGATIVE 11/11/2018 2246   KETONESUR NEGATIVE 11/11/2018 2246   PROTEINUR NEGATIVE 11/11/2018 2246   NITRITE NEGATIVE 11/11/2018 2246   LEUKOCYTESUR TRACE (A) 11/11/2018 2246    Sepsis Labs: Lactic Acid, Venous    Component Value Date/Time   LATICACIDVEN 1.3 11/12/2018 0711    MICROBIOLOGY: Recent Results (from the past 240 hour(s))  MRSA PCR Screening     Status: None   Collection Time: 11/12/18  4:26 AM  Result Value Ref Range Status   MRSA by PCR NEGATIVE NEGATIVE Final    Comment:        The GeneXpert MRSA Assay (FDA approved for NASAL specimens only), is one component of a comprehensive MRSA colonization surveillance program. It is not intended to diagnose MRSA infection nor to guide or monitor  treatment for MRSA infections. Performed at [facility], [address] [place], [place] [address]     RADIOLOGY STUDIES/RESULTS: Dg Chest Port 1 View  Result Date: 11/12/2018 CLINICAL DATA:  Sepsis EXAM: PORTABLE CHEST 1 VIEW COMPARISON:  06/10/2017 FINDINGS: Shallow inspiration. Cardiac enlargement. No vascular congestion, edema, or consolidation. No blunting of costophrenic angles. No  pneumothorax. Mediastinal contours appear intact. IMPRESSION: Cardiac enlargement. No evidence of active pulmonary disease. Electronically Signed   By: Burman Nieves M.D.   On: 11/12/2018 00:26   Dg Foot Complete Right  Result Date: 11/11/2018 Please see detailed radiograph report in office note.  Vas [place] Vanice Sarah With/wo Tbi  Result Date: 10/21/2018 LOWER EXTREMITY DOPPLER STUDY Indications: Ulceration, and peripheral artery disease. High Risk Factors: Hypertension, Diabetes, past history of smoking.  Vascular Interventions: L BKA 08/27/2017 with subsequent AKA 08/30/2017.  Examination Guidelines: A complete evaluation includes at minimum, Doppler waveform signals and systolic blood pressure reading at the level of bilateral brachial, anterior tibial, and posterior tibial arteries, when vessel segments are accessible. Bilateral testing is considered an integral part of a complete examination. Photoelectric Plethysmograph (PPG) waveforms and toe systolic pressure readings are included as required and additional duplex testing as needed. Limited examinations for reoccurring indications may be performed as noted.  ABI Findings: +---------+------------------+-----+----------+--------+ Right    Rt Pressure (mmHg)IndexWaveform  Comment  +---------+------------------+-----+----------+--------+ Brachial 160                                       +---------+------------------+-----+----------+--------+ PTA      114               0.71 monophasic          +---------+------------------+-----+----------+--------+ DP       138               0.86 monophasic         +---------+------------------+-----+----------+--------+ Great Toe71                0.44                    +---------+------------------+-----+----------+--------+ +--------+------------------+-----+--------+-------+ Left    Lt Pressure (mmHg)IndexWaveformComment +--------+------------------+-----+--------+-------+ [id number]                                    +--------+------------------+-----+--------+-------+ +-------+-----------+-----------+------------+------------+ ABI/TBIToday's ABIToday's TBIPrevious ABIPrevious TBI +-------+-----------+-----------+------------+------------+ Right  0.86       0.44       1.02        0.45         +-------+-----------+-----------+------------+------------+  Summary: Right: ABIs are unreliable. Index and waveform do not coorelate.  *See table(s) above for measurements and observations.  Electronically signed by Sherald Hess MD on 10/21/2018 at 3:28:07 PM.   Final    Vas [place] Lower Extremity Arterial Duplex  Result Date: 10/21/2018 LOWER EXTREMITY ARTERIAL DUPLEX STUDY Indications: Ulceration, and peripheral artery disease. High Risk Factors: Hypertension, hyperlipidemia, Diabetes.  Current ABI: R=0.86, L=AKA Performing Technologist: Dorthula Matas RVS, RCS  Examination Guidelines: A complete evaluation includes B-mode imaging, spectral Doppler, color Doppler, and power Doppler as needed of all accessible portions of each vessel. Bilateral testing is considered an integral part of a complete examination. Limited examinations for reoccurring indications may be performed as noted.  Right Duplex Findings: +----------+--------+-----+--------+----------+-----------------------------+           PSV cm/sRatioStenosisWaveform  Comments                       +----------+--------+-----+--------+----------+-----------------------------+ CFA Mid   151                  biphasic                                +----------+--------+-----+--------+----------+-----------------------------+  CFA Distal122                  biphasic                                +----------+--------+-----+--------+----------+-----------------------------+ DFA       115                  biphasic                                +----------+--------+-----+--------+----------+-----------------------------+ SFA Prox  122                  biphasic  circumferential calcification +----------+--------+-----+--------+----------+-----------------------------+ SFA Mid   136                  biphasic  circumferential calcification +----------+--------+-----+--------+----------+-----------------------------+ SFA Distal120                  biphasic  circumferential calcification +----------+--------+-----+--------+----------+-----------------------------+ POP Prox  94                   biphasic  circumferential calcification +----------+--------+-----+--------+----------+-----------------------------+ POP Distal110                  biphasic                                +----------+--------+-----+--------+----------+-----------------------------+ PTA Distal16                   monophasiccircumferential calcification +----------+--------+-----+--------+----------+-----------------------------+  Summary: Right: Patent right lower extremity arterial system with diffuse calcified disease observed. No hemodynamically significant stenosis observed. Several areas of limited visualization due to acoustic shadowing.  See table(s) above for measurements and observations. Electronically signed by Sherald Hess MD on 10/21/2018 at 4:13:55 PM.    Final      LOS: 0 days   Laurette Schimke, PA-S [facility]  If 7PM-7AM, please contact  night-coverage  Please page via [email or web address]-click on MD name and type text message  11/12/2018, 11:29 AM

## 2018-11-12 NOTE — Anesthesia Procedure Notes (Signed)
Procedure Name: LMA Insertion Date/Time: 11/12/2018 2:47 PM Performed by: Epifanio Lesches, CRNA Pre-anesthesia Checklist: Patient identified, Emergency Drugs available, Suction available and Patient being monitored Patient Re-evaluated:Patient Re-evaluated prior to induction Oxygen Delivery Method: Circle System Utilized Preoxygenation: Pre-oxygenation with 100% oxygen Induction Type: IV induction Ventilation: Mask ventilation without difficulty LMA: LMA inserted LMA Size: 4.0 Number of attempts: 1 Airway Equipment and Method: Bite block Placement Confirmation: positive ETCO2 Tube secured with: Tape Dental Injury: Teeth and Oropharynx as per pre-operative assessment

## 2018-11-12 NOTE — Progress Notes (Signed)
CBG 352. Will continue to monitor.

## 2018-11-12 NOTE — H&P (Addendum)
History and Physical    Danielle Buchanan ZOX:[id number] DOB: November 12, 1955 DOA: 11/11/2018  PCP: Shirlean MylarWebb, Carol, MD  Patient coming from: Home  I have personally briefly reviewed [facility]  Chief Complaint: Toe infection  HPI: Danielle Buchanan is a 63 y.o. female with medical history significant of DM2, HTN, PAD s/p L AKA in 2018, R 4th toe amputation.  Patient with injury to R 3rd toe back in Mid Jan.  Seen in office, had eschar but no infection.  Followed up with podiatrist today and was sent in to ED with grossly infected / necrotic 3rd R toe (see physical exam below).  ED Course: Dr. Myra GianottiBrabham saw patient in ED, consult note under incomplete section of chart, but he is adding patient to angio schedule for tomorrow for vascular evaluation.  Patient will need amputation of R 3rd toe at a minimum.  Wants hospitalist to admit.   Review of Systems: As per HPI otherwise 10 point review of systems negative.   Past Medical History:  Diagnosis Date  . Allergic rhinitis   . Depression   . Diabetes (HCC)   . Dyslipidemia   . HTN (hypertension)   . Insomnia   . OSA (obstructive sleep apnea)    no cpap  . Urinary incontinence     Past Surgical History:  Procedure Laterality Date  . ABDOMINAL AORTOGRAM W/LOWER EXTREMITY N/A 06/24/2017   Procedure: ABDOMINAL AORTOGRAM W/LOWER EXTREMITY;  Surgeon: Maeola Harmanain, Brandon Christopher, MD;  [facility];  Service: Cardiovascular;  Laterality: N/A;  . ABDOMINAL AORTOGRAM W/LOWER EXTREMITY Right 10/09/2017   Procedure: ABDOMINAL AORTOGRAM W/LOWER EXTREMITY;  Surgeon: Maeola Harmanain, Brandon Christopher, MD;  [facility];  Service: Cardiovascular;  Laterality: Right;  . AMPUTATION Left 07/23/2017   Procedure: AMPUTATION  BELOW KNEE;  Surgeon: Maeola Harmanain, Brandon Christopher, MD;  [facility];  Service: Vascular;  Laterality: Left;  . AMPUTATION Left 08/12/2017   Procedure: REVISION BELOW KNEE;  Surgeon:  Maeola Harmanain, Brandon Christopher, MD;  [facility];  Service: Vascular;  Laterality: Left;  . AMPUTATION Left 08/27/2017   Procedure: left ABOVE KNEE AMPUTATION;  Surgeon: Maeola Harmanain, Brandon Christopher, MD;  [facility];  Service: Vascular;  Laterality: Left;  . AMPUTATION Left 08/30/2017   Procedure: REVISION AMPUTATION ABOVE KNEE;  Surgeon: Nada LibmanBrabham, Vance W, MD;  [facility];  Service: Vascular;  Laterality: Left;  . AMPUTATION Right 11/04/2017   Procedure: AMPUTATION RIGHT FOURTH TOE;  Surgeon: Maeola Harmanain, Brandon Christopher, MD;  [facility];  Service: Vascular;  Laterality: Right;  . APPLICATION OF WOUND VAC Left 08/27/2017   Procedure: APPLICATION OF WOUND VAC;  Surgeon: Maeola Harmanain, Brandon Christopher, MD;  [facility];  Service: Vascular;  Laterality: Left;  . CARPAL TUNNEL RELEASE Right   . COLONOSCOPY    . LOWER EXTREMITY INTERVENTION Right 10/09/2017   Procedure: LOWER EXTREMITY INTERVENTION;  Surgeon: Maeola Harmanain, Brandon Christopher, MD;  [facility];  Service: Cardiovascular;  Laterality: Right;     reports that she has been smoking cigarettes. She has a 51.00 pack-year smoking history. She has never used smokeless tobacco. She reports that she does not drink alcohol or use drugs.  Allergies  Allergen Reactions  . Milk-Related Compounds     incontinence  . Doxycycline Rash    Family History  Problem Relation Age of Onset  . Heart attack Mother   . Alcohol abuse Mother   . Heart attack Father   .  Alcohol abuse Father   . Heart attack Sister   . Heart attack Brother   . Hypercholesterolemia Sister   . Diabetes Neg Hx      Prior to Admission medications   Medication Sig Start Date End Date Taking? Authorizing Provider  amitriptyline (ELAVIL) 25 MG tablet Take 25 mg by mouth at bedtime.   Yes [provider]  aspirin EC 325 MG tablet Take 325 mg by mouth daily.   Yes [provider]  insulin NPH-regular Human (NOVOLIN 70/30) (70-30) 100  UNIT/ML injection 120 units with breakfast, and 90 units with supper, and syringes 3/day. Patient taking differently: Inject 60-100 Units into the skin 2 (two) times daily with a meal. 60 units with breakfast, and 100 units with supper, and syringes 3/day. 03/11/18  Yes [name], MD  losartan (COZAAR) 100 MG tablet Take 100 mg by mouth daily.   Yes [provider]  nicotine polacrilex (NICORELIEF) 2 MG gum Take 2 mg by mouth daily as needed for smoking cessation.    Yes [provider]  pravastatin (PRAVACHOL) 20 MG tablet Take 40 mg by mouth daily.    Yes [provider]  sertraline (ZOLOFT) 100 MG tablet Take 100 mg by mouth daily.    Yes [provider]  traZODone (DESYREL) 50 MG tablet Take 50 mg by mouth at bedtime.   Yes [provider]  VITAMIN E PO Take 180 mg by mouth daily.   Yes [provider]  Zinc 50 MG TABS Take 50 mg by mouth daily.   Yes [provider]    Physical Exam: Vitals:   11/12/18 0100 11/12/18 0115 11/12/18 0215 11/12/18 0230  BP: 130/60 121/61 (!) 117/55 (!) 113/57  Pulse: 96 95 93 91  Resp: (!) 28 (!) 24 (!) 25 20  Temp:      TempSrc:      SpO2: 95% 95% 96% 94%    Constitutional: NAD, calm, comfortable Eyes: PERRL, lids and conjunctivae normal ENMT: Mucous membranes are moist. Posterior pharynx clear of any exudate or lesions.Normal dentition.  Neck: normal, supple, no masses, no thyromegaly Respiratory: clear to auscultation bilaterally, no wheezing, no crackles. Normal respiratory effort. No accessory muscle use.  Cardiovascular: Regular rate and rhythm, no murmurs / rubs / gallops. No extremity edema. 2+ pedal pulses. No carotid bruits.  Abdomen: no tenderness, no masses palpated. No hepatosplenomegaly. Bowel sounds positive.  Musculoskeletal: no clubbing / cyanosis. No joint deformity upper and lower extremities. Good ROM, no contractures. Normal muscle tone.  Skin: Neurologic: CN 2-12  grossly intact. Sensation intact, DTR normal. Strength 5/5 in all 4.  Psychiatric: Normal judgment and insight. Alert and oriented x 3. Normal mood.    Labs on Admission: I have personally reviewed following labs and imaging studies  CBC: Recent Labs  Lab 11/11/18 1837  WBC 14.4*  NEUTROABS 11.5*  HGB 13.3  HCT 41.2  MCV 87.8  PLT 240   Basic Metabolic Panel: Recent Labs  Lab 11/11/18 1837  NA 129*  K 4.7  CL 91*  CO2 26  GLUCOSE 406*  BUN 16  CREATININE 0.89  CALCIUM 9.3   GFR: CrCl cannot be calculated (Unknown ideal weight.). Liver Function Tests: Recent Labs  Lab 11/11/18 1837  AST 12*  ALT 13  ALKPHOS 85  BILITOT 0.6  PROT 7.4  ALBUMIN 3.2*   No results for input(s): LIPASE, AMYLASE in the last 168 hours. No results for input(s): AMMONIA in the last 168 hours.  Coagulation Profile: Recent Labs  Lab 11/12/18 0057  INR 1.07   Cardiac Enzymes: No results for input(s): CKTOTAL, CKMB, CKMBINDEX, TROPONINI in the last 168 hours. BNP (last 3 results) No results for input(s): PROBNP in the last 8760 hours. HbA1C: No results for input(s): HGBA1C in the last 72 hours. CBG: No results for input(s): GLUCAP in the last 168 hours. Lipid Profile: No results for input(s): CHOL, HDL, LDLCALC, TRIG, CHOLHDL, LDLDIRECT in the last 72 hours. Thyroid Function Tests: No results for input(s): TSH, T4TOTAL, FREET4, T3FREE, THYROIDAB in the last 72 hours. Anemia Panel: No results for input(s): VITAMINB12, FOLATE, FERRITIN, TIBC, IRON, RETICCTPCT in the last 72 hours. Urine analysis:    Component Value Date/Time   COLORURINE YELLOW 11/11/2018 2246   APPEARANCEUR CLEAR 11/11/2018 2246   LABSPEC 1.022 11/11/2018 2246   PHURINE 6.0 11/11/2018 2246   GLUCOSEU >=500 (A) 11/11/2018 2246   HGBUR SMALL (A) 11/11/2018 2246   BILIRUBINUR NEGATIVE 11/11/2018 2246   KETONESUR NEGATIVE 11/11/2018 2246   PROTEINUR NEGATIVE 11/11/2018 2246   NITRITE NEGATIVE 11/11/2018 2246    LEUKOCYTESUR TRACE (A) 11/11/2018 2246    Radiological Exams on Admission: Dg Chest Port 1 View  Result Date: 11/12/2018 CLINICAL DATA:  Sepsis EXAM: PORTABLE CHEST 1 VIEW COMPARISON:  06/10/2017 FINDINGS: Shallow inspiration. Cardiac enlargement. No vascular congestion, edema, or consolidation. No blunting of costophrenic angles. No pneumothorax. Mediastinal contours appear intact. IMPRESSION: Cardiac enlargement. No evidence of active pulmonary disease. Electronically Signed   By: Burman NievesWilliam  Stevens M.D.   On: 11/12/2018 00:26   Dg Foot Complete Right  Result Date: 11/11/2018 Please see detailed radiograph report in office note.   EKG: Independently reviewed.  Assessment/Plan Principal Problem:   Osteomyelitis of third toe of right foot (HCC) Active Problems:   HTN (hypertension)   Amputation of left lower extremity above knee upon examination (HCC)   Diabetes mellitus type 2 in obese [facility](HCC)   Atherosclerosis of native arteries of the extremities with gangrene (HCC)    1. Osteomyelitis of third toe of R foot - 1. Diabetic foot ulcer pathway 2. Procalcitonin neg, will de-escalate ABx to rocephin / flagyl for the moment 1. Checking MRSA PCR nares, if positive then would resume vanc 3. Wound care consult 4. Vasc surg consult - see Dr. Estanislado SpireBrabham's note 5. NPO after MN 6. To OR today 2. HTN - continue losartan 3. DM2 - 1. While patient NPO 2. resistant scale SSI Q4H 1. Note patient takes 210u insulin / day total 3. Diabetes coordinator eval 4. A1C ordered 5. Sounds like she is planning on seeing Dr. Sharl MaKerr in July, but seems like she is already overdue for follow up with endocrine  DVT prophylaxis: None - OR planned in AM, s/p amputation LLE, RLE has severe PAD so not ordering SCDs for the moment Code Status: Full Family Communication: No family in room Disposition Plan: Home after admit Consults called: Vascular surgery Admission status: Admit to inpatient  Severity of  Illness: The appropriate patient status for this patient is INPATIENT. Inpatient status is judged to be reasonable and necessary in order to provide the required intensity of service to ensure the patient's safety. The patient's presenting symptoms, physical exam findings, and initial radiographic and laboratory data in the context of their chronic comorbidities is felt to place them at high risk for further clinical deterioration. Furthermore, it is not anticipated that the patient will be medically stable for discharge from the hospital within 2 midnights of admission. The following  factors support the patient status of inpatient.   " The patient's presenting symptoms include Toe ulcer, gangrene, exposed bone. " The worrisome physical exam findings include Exposed bone R 3rd toe. " The chronic co-morbidities include Uncontrolled DM2.   * I certify that at the point of admission it is my clinical judgment that the patient will require inpatient hospital care spanning beyond 2 midnights from the point of admission due to high intensity of service, high risk for further deterioration and high frequency of surveillance required.*    Kelse Ploch M. DO [facility]  How to contact the [facility] Attending or Consulting provider 7A - 7P or covering provider during after hours 7P -7A, for this patient?  1. Check the care team in [facility] and look for a) attending/consulting TR[facility] provider listed and b) the [facility] team listed 2. Log into [email or web address]  Amion Physician Scheduling and messaging for groups and whole hospitals  On call and physician scheduling software for group practices, residents, hospitalists and other medical providers for call, clinic, rotation and shift schedules. OnCall Enterprise is a hospital-wide system for scheduling doctors and paging doctors on call. EasyPlot is for scientific plotting and data analysis.  [email or web address]  and use [place]'s universal password to access. If you do  not have the password, please contact the hospital operator.  3. Locate the [facility] provider you are looking for under [facility] and page to a number that you can be directly reached. 4. If you still have difficulty reaching the provider, please page the [facility] (Director on Call) for the Hospitalists listed on amion for assistance.  11/12/2018, 2:50 AM

## 2018-11-12 NOTE — Progress Notes (Addendum)
Initial Nutrition Assessment  DOCUMENTATION CODES:   Obesity unspecified  INTERVENTION:   Pt was educated on wound healing and needing adequate nutrition and importance of blood sugar control.   Juven Fruit Punch BID, each serving provides 95kcal and 2.5g of protein (amino acids glutamine and arginine)  Snacks at 10am, 2pm, and 8pm.  MVI with Minerals   NUTRITION DIAGNOSIS:   Increased nutrient needs related to wound healing, post-op healing as evidenced by estimated needs.    GOAL:   Patient will meet greater than or equal to 90% of their needs    MONITOR:   PO intake, Supplement acceptance, Labs, Skin  REASON FOR ASSESSMENT:   Consult Wound healing  ASSESSMENT:   Pt with PMH of DM2, HTN, PAD s/p L AKA (2018), and R 4th toe amputation. Mid Jan 2020, Pt had injury to R 3rd toe, now admitted for grossly infected/necrotic 3rd R toe.   Pt lives alone in an apartment uses a motorized wheelchair for mobility. Sister, who she lived with, died 05-14-2018. Pt reports not having much of an appetite. When asked about UBW she referenced 225 but seemed unsure. Pt reported not eating regularly saying that it will help control her blood sugars. Speaking with the nephew, he states that she has her medication delivered at home, but is unsure of regular taking of medication. Pt also reports that she is taking Zinc (50mg  Daily) and Vitamin E (180mg  Daily) since her L AKA 2018.   Pt describes intake as Belvita breakfast crackers with plain black coffee for breakfast and eating 0.5 sandwich (Peanut Butter and Jelly) with soup for lunch and dinner. Sometimes niece will bring food for dinner such as hamburger helper. When asked about other items the niece may bring over, she couldn't recall--saying they were strange foods. Nephew also provided example of eating cookies as a snack then not eating again for 6hrs as the form of blood sugar control.   At home Pt reports taking 90 units insulin in  the morning and 70 units in the afternoon and that she checks her BG daily with a monitor/finger stick.   When asked about supplemental drinks such as Ensure, she states that she is lactose intolerant and doesn't consume dairy, and doesn't like the drinks. Pt was educated on wound healing and needing adequate nutrition and asked if she would try snacks and Juven. Pt was willing to try.        Medications reviewed and include: SSI every 4h, 50units daily  Labs reviewed:   CBG (last 3)  Recent Labs    11/13/18 0207 11/13/18 0624 11/13/18 1112  GLUCAP 186* 191* 131*   Lab Results  Component Value Date   HGBA1C 10.9 (H) 11/12/2018      NUTRITION - FOCUSED PHYSICAL EXAM:  NFPE preformed on R Leg only (L AKA).     Most Recent Value  Orbital Region  No depletion  Upper Arm Region  No depletion  Thoracic and Lumbar Region  No depletion  Buccal Region  No depletion  Temple Region  No depletion  Clavicle Bone Region  No depletion  Clavicle and Acromion Bone Region  No depletion  Scapular Bone Region  No depletion  Dorsal Hand  Mild depletion  Patellar Region  No depletion  Anterior Thigh Region  No depletion  Posterior Calf Region  No depletion  Edema (RD Assessment)  None  Hair  Reviewed  Eyes  Reviewed  Mouth  Reviewed  Skin  Reviewed  Nails  Reviewed       Diet Order:   Diet Order            Diet Carb Modified Fluid consistency: Thin; Room service appropriate? Yes  Diet effective now              EDUCATION NEEDS:   Education needs have been addressed  Skin:  Skin Assessment: Skin Integrity Issues: Skin Integrity Issues:: Diabetic Ulcer Diabetic Ulcer: R 3rd Toe  Last BM:  unknown  Height:   Ht Readings from Last 1 Encounters:  11/12/18 5\' 5"  (1.651 m)    Weight:   Wt Readings from Last 1 Encounters:  11/12/18 95.3 kg    Ideal Body Weight:  52.3 kg  BMI:  38.00 - Adjusted for BKA  Estimated Nutritional Needs:   Kcal:   1600-1800  Protein:  95-115 grams  Fluid:  >1.6L  Burnard Bunting, [facility] Dietetic Intern

## 2018-11-12 NOTE — ED Notes (Addendum)
Unsuccessful attempt on blood draw/ RN Baird Lyons informed

## 2018-11-12 NOTE — Anesthesia Preprocedure Evaluation (Signed)
Anesthesia Evaluation  Patient identified by MRN, date of birth, ID band Patient awake    Reviewed: Allergy & Precautions, H&P , NPO status , Patient's Chart, lab work & pertinent test results  Airway Mallampati: II  TM Distance: >3 FB Neck ROM: Full    Dental no notable dental hx. (+) Teeth Intact, Dental Advisory Given   Pulmonary sleep apnea , Current Smoker,    Pulmonary exam normal breath sounds clear to auscultation       Cardiovascular hypertension, Pt. on medications + Peripheral Vascular Disease   Rhythm:Regular Rate:Normal     Neuro/Psych Depression negative neurological ROS     GI/Hepatic negative GI ROS, Neg liver ROS,   Endo/Other  diabetes, Insulin Dependent  Renal/GU negative Renal ROS  negative genitourinary   Musculoskeletal   Abdominal   Peds  Hematology  (+) Blood dyscrasia, anemia ,   Anesthesia Other Findings   Reproductive/Obstetrics negative OB ROS                             Anesthesia Physical Anesthesia Plan  ASA: III  Anesthesia Plan: General   Post-op Pain Management:    Induction: Intravenous  PONV Risk Score and Plan: 3 and Ondansetron, Dexamethasone and Midazolam  Airway Management Planned: LMA  Additional Equipment:   Intra-op Plan:   Post-operative Plan: Extubation in OR  Informed Consent: I have reviewed the patients History and Physical, chart, labs and discussed the procedure including the risks, benefits and alternatives for the proposed anesthesia with the patient or authorized representative who has indicated his/her understanding and acceptance.     Dental advisory given  Plan Discussed with: CRNA  Anesthesia Plan Comments:         Anesthesia Quick Evaluation

## 2018-11-13 ENCOUNTER — Other Ambulatory Visit: Payer: Self-pay

## 2018-11-13 ENCOUNTER — Encounter (HOSPITAL_COMMUNITY): Payer: Self-pay | Admitting: General Practice

## 2018-11-13 DIAGNOSIS — F329 Major depressive disorder, single episode, unspecified: Secondary | ICD-10-CM

## 2018-11-13 DIAGNOSIS — A419 Sepsis, unspecified organism: Secondary | ICD-10-CM

## 2018-11-13 LAB — GLUCOSE, CAPILLARY
GLUCOSE-CAPILLARY: 180 mg/dL — AB (ref 70–99)
Glucose-Capillary: 186 mg/dL — ABNORMAL HIGH (ref 70–99)
Glucose-Capillary: 191 mg/dL — ABNORMAL HIGH (ref 70–99)
Glucose-Capillary: 131 mg/dL — ABNORMAL HIGH (ref 70–99)
Glucose-Capillary: 148 mg/dL — ABNORMAL HIGH (ref 70–99)

## 2018-11-13 LAB — CBC
HCT: 33.3 % — ABNORMAL LOW (ref 36.0–46.0)
Hemoglobin: 10.8 g/dL — ABNORMAL LOW (ref 12.0–15.0)
MCH: 28.6 pg (ref 26.0–34.0)
MCHC: 32.4 g/dL (ref 30.0–36.0)
MCV: 88.3 fL (ref 80.0–100.0)
Platelets: 208 10*3/uL (ref 150–400)
RBC: 3.77 MIL/uL — ABNORMAL LOW (ref 3.87–5.11)
RDW: 13.1 % (ref 11.5–15.5)
WBC: 11.4 10*3/uL — ABNORMAL HIGH (ref 4.0–10.5)
nRBC: 0 % (ref 0.0–0.2)

## 2018-11-13 LAB — BASIC METABOLIC PANEL
Anion gap: 11 (ref 5–15)
BUN: 11 mg/dL (ref 8–23)
CO2: 23 mmol/L (ref 22–32)
CREATININE: 0.77 mg/dL (ref 0.44–1.00)
Calcium: 8.3 mg/dL — ABNORMAL LOW (ref 8.9–10.3)
Chloride: 100 mmol/L (ref 98–111)
GFR calc Af Amer: >60 mL/min (ref >60)
GFR calc non Af Amer: >60 mL/min (ref >60)
Glucose, Bld: 193 mg/dL — ABNORMAL HIGH (ref 70–99)
Potassium: 4 mmol/L (ref 3.5–5.1)
Sodium: 134 mmol/L — ABNORMAL LOW (ref 135–145)

## 2018-11-13 LAB — HIV ANTIBODY (ROUTINE TESTING W REFLEX): HIV Screen 4th Generation wRfx: NONREACTIVE

## 2018-11-13 MED ORDER — OXYCODONE-ACETAMINOPHEN 5-325 MG PO TABS
1.0000 | ORAL_TABLET | Freq: Four times a day (QID) | ORAL | Status: DC | PRN
  Administered 2018-11-13 (×2): 2 via ORAL
  Administered 2018-11-16: 1 via ORAL
  Administered 2018-11-18 – 2018-11-20 (×4): 2 via ORAL
  Filled 2018-11-13 (×3): qty 2
  Filled 2018-11-13: qty 1
  Filled 2018-11-13 (×3): qty 2

## 2018-11-13 MED ORDER — JUVEN PO PACK
1.0000 | PACK | Freq: Two times a day (BID) | ORAL | Status: DC
  Administered 2018-11-13 – 2018-11-16 (×4): 1 via ORAL
  Filled 2018-11-13 (×15): qty 1

## 2018-11-13 MED ORDER — OXYCODONE-ACETAMINOPHEN 5-325 MG PO TABS
2.0000 | ORAL_TABLET | Freq: Once | ORAL | Status: AC
  Administered 2018-11-13: 2 via ORAL
  Filled 2018-11-13: qty 2

## 2018-11-13 MED ORDER — ADULT MULTIVITAMIN W/MINERALS CH
1.0000 | ORAL_TABLET | Freq: Every day | ORAL | Status: DC
  Administered 2018-11-13 – 2018-11-22 (×9): 1
  Filled 2018-11-13 (×8): qty 1

## 2018-11-13 NOTE — Plan of Care (Signed)
  Problem: Clinical Measurements: Goal: Ability to maintain clinical measurements within normal limits will improve Outcome: Progressing   Problem: Clinical Measurements: Goal: Cardiovascular complication will be avoided Outcome: Progressing   Problem: Elimination: Goal: Will not experience complications related to bowel motility Outcome: Progressing   

## 2018-11-13 NOTE — Clinical Social Work Note (Signed)
CSW acknowledges consult "Access Meds for Discharge." RNCM already following.  CSW signing off.  Charlynn Court, CSW [phone]

## 2018-11-13 NOTE — Progress Notes (Signed)
Inpatient Diabetes Program Recommendations  AACE/ADA: New Consensus Statement on Inpatient Glycemic Control (2015)  Target Ranges:  Prepandial:   less than 140 mg/dL      Peak postprandial:   less than 180 mg/dL (1-2 hours)      Critically ill patients:  140 - 180 mg/dL   Lab Results  Component Value Date   GLUCAP 180 (H) 11/13/2018   HGBA1C 10.9 (H) 11/12/2018    Review of Glycemic Control  Diabetes history: DM2 Outpatient Diabetes medications: Novolin 70/30 60 at B, 100 units at supper Current orders for Inpatient glycemic control: Lantus 50 units QD, Novolog 0-20 units Q4H  Discussed HgbA1C of 10.9% with pt. Pt appears to be very depressed. States she wants another endocrinologist to manage her diabetes. Said she checks blood sugars, and it's always high. States she can't exercise and eats a fairly healthy diet. States stress is likely running her blood sugars up. Long discussion with pt on importance of controlling her blood sugars. Said "I just want to go home."  Blood sugars today 191, 131, 180 mg/dL. Eating 75-100%.  Inpatient Diabetes Program Recommendations:     Did not receive Lantus 50 units this am. Blood sugars acceptable today. May need to reduce Lantus dose in am.  Will continue to follow.  Thank you. Danielle Buchanan, RD, LDN, CDE Inpatient Diabetes Coordinator [phone]

## 2018-11-13 NOTE — Progress Notes (Signed)
Orthopedic Tech Progress Note Patient Details:  Danielle ContesJanice M Buchanan 03/17/1956 [id number]  Ortho Devices Type of Ortho Device: Postop shoe/boot Ortho Device/Splint Interventions: Ordered, Application, Adjustment   Post Interventions Patient Tolerated: Well Instructions Provided: Adjustment of device, Care of device   Danielle Buchanan 11/13/2018, 1:17 PM

## 2018-11-13 NOTE — Care Management Note (Signed)
Case Management Note  Patient Details  Name: JAHZEEL RAILSBACK MRN: [id number] Date of Birth: 06-17-1956  Subjective/Objective:                    Action/Plan:  Spoke w patient at bedside. She lives at home alone, in an apartment, no stairs, states she has "people checking" in on her all the time. HH ordered, has Medicare list, chose [facility], referral made to [facility]. Has VAC, spoke w S Rhyne PA, no preference for brand type, Requested [facility] to leave order form on front of chart for S Rhyne to sign. VAC will need approved and delivered to room prior to DC. Please contact CM as [facility] will need notified to connect home VAC to patient just prior to DC.   Expected Discharge Date:                  Expected Discharge Plan:     In-House Referral:     Discharge planning Services  CM Consult  Post Acute Care Choice:  Home Health, Durable Medical Equipment Choice offered to:  Patient  DME Arranged:  Vac DME Agency:  [facility]  HH Arranged:  RN [facility] Agency:  [facility]  Status of Service:  In process, will continue to follow  If discussed at Long Length of Stay Meetings, dates discussed:    Additional Comments:  Lawerance Sabal, RN 11/13/2018, 1:51 PM

## 2018-11-13 NOTE — Progress Notes (Addendum)
  Progress Note    11/13/2018 11:04 AM 1 Day Post-Op  Subjective:  Says her foot feels better today.  Says she's tired  Afebrile VSS  Vitals:   11/13/18 0552 11/13/18 0915  BP: 116/62 105/60  Pulse: 72 66  Resp: 18 12  Temp: 98.9 F (37.2 C) 98.2 F (36.8 C)  SpO2: 94% 95%    Physical Exam: General:  Sleeping; awakes easily and comfortable Lungs:  Non labored Incisions:  Left groin soft without hematoma Extremities:  Right foot with wound vac in place with good seal   CBC    Component Value Date/Time   WBC 11.4 (H) 11/13/2018 0206   RBC 3.77 (L) 11/13/2018 0206   HGB 10.8 (L) 11/13/2018 0206   HCT 33.3 (L) 11/13/2018 0206   PLT 208 11/13/2018 0206   MCV 88.3 11/13/2018 0206   MCH 28.6 11/13/2018 0206   MCHC 32.4 11/13/2018 0206   RDW 13.1 11/13/2018 0206   LYMPHSABS 1.5 11/11/2018 1837   MONOABS 0.8 11/11/2018 1837   EOSABS 0.2 11/11/2018 1837   BASOSABS 0.1 11/11/2018 1837    BMET    Component Value Date/Time   NA 134 (L) 11/13/2018 0206   K 4.0 11/13/2018 0206   CL 100 11/13/2018 0206   CO2 23 11/13/2018 0206   GLUCOSE 193 (H) 11/13/2018 0206   BUN 11 11/13/2018 0206   CREATININE 0.77 11/13/2018 0206   CALCIUM 8.3 (L) 11/13/2018 0206   GFRNONAA >60 11/13/2018 0206   GFRAA >60 11/13/2018 0206    INR    Component Value Date/Time   INR 1.07 11/12/2018 0057     Intake/Output Summary (Last 24 hours) at 11/13/2018 1104 Last data filed at 11/13/2018 0900 Gross per 24 hour  Intake 2258.49 ml  Output 900 ml  Net 1358.49 ml     Assessment:  63 y.o. female is s/p:  Procedure Performed:             1.  Ultrasound-guided access, left femoral artery             2.  Abdominal aortogram             3.  Right lower extremity runoff             4.  Angioplasty, right tibioperoneal trunk and peroneal artery             5.  Closure device (Mynx)             6.  Transmetatarsal amputation, toes 2 through 5             7.  Placement of transmetatarsal  amputation site wound VAC  1 Day Post-Op  Plan: -pt doing well this am.  Wound vac in place with good seal.  Right foot is warm.  Motor/sensation in tact right great toe. -creatinine WNL -leukocytosis improved from 2 days ago and afebrile    Doreatha Massed, PA-C [facility] [phone] 11/13/2018 11:04 AM  Agree with the above.  Plan wound vac change tomorrow.  Continue Abx. Cultures pending.  [name]

## 2018-11-13 NOTE — Progress Notes (Addendum)
[facility] PROGRESS NOTE  Danielle Buchanan ION:[id number] DOB: 03-Apr-1956 DOA: 11/11/2018 PCP: [name], MD  Assessment/Plan: 1. Sepsis secondary to necrotic right third toe with concerns for osteomyelitis, suspected diabetic foot ulcer.  Underwent transmetatarsal amputation with wound VAC placement 2/5 no complications.  WContinue IV ceftriaxone and Flagyl while awaiting cultures for intraoperative pathology.  Sepsis physiology resolving with normalization of respiratory rate and improvement in white count as it downtrends, vascular surgery following, will need PT eval for home disposition 2. Severe stenosis of right peroneal artery, status post balloon angioplasty on 2/6.  No residual stenosis 3. Acute blood loss anemia.  Suspect patient has chronic anemia as has had low hemoglobin of 08/2018.  Prior to admission hemoglobin was 13.3, suspect this was hemoconcentrated?.  Current hemoglobin 10.8, will continue to monitor CBC. 4. Type 2 diabetes, poorly controlled.  A1c 10.9% (11/12/2018).  On a significant amount of insulin at home (190 units total daily).  On reduced dose here since was n.p.o. prior to procedure, currently on 50 units closely monitor as expect will need to go up on regimen that the patient tolerating diet. FS 180 this am. 5. Hypertension, stable.  Continue home losartan 6. Hyperlipidemia, stable continue home pravastatin 7. Depression, stable continue home Zoloft and Elavil   Code Status: full code Family Communication: updated son at bedside Disposition Plan: continue IV antibiotics, monitoring cultures, wound vac to exchange tomorrow per surgery, diabetes control   Consultants:  Vascular Surgery  Procedures:  Transmetatarsal amputation of right foot (second through fifth digits) on 2/5 with wound VAC placement  Antibiotics:  IV ceftriaxone 2/5  IV Flagyl 2/4  IV Zosyn 2/4 complete  IV Vanco 2/4 complete  HPI/Subjective:  Danielle Buchanan is a 63 y.o.  year old female with medical history  63 y.o. year old female with medical history significant for DM2, HTN, PAD s/p L AKA in 2018, R 4th toe amputation and who presented on 11/11/2018 with concern for grossly infected/necrotic third right toe after podiatrist outpatient visit and was found to have necrotic third right toe with concerns for osteomyelitis.  This a.m. has no complaints.  Tolerated breakfast.  Reports pain well controlled and right foot.  Objective: Vitals:   11/13/18 1251 11/13/18 1349  BP: (!) 115/56 116/72  Pulse: 76 73  Resp: 18 16  Temp: 98.6 F (37 C) 97.9 F (36.6 C)  SpO2: 98% 96%    Intake/Output Summary (Last 24 hours) at 11/13/2018 1659 Last data filed at 11/13/2018 1300 Gross per 24 hour  Intake 1558.49 ml  Output 1100 ml  Net 458.49 ml   Filed Weights   11/12/18 1357  Weight: 95.3 kg    Exam:  Constitutional:normal appearing female, no acute distress Eyes: EOMI, anicteric, normal conjunctivae ENMT: Oropharynx with moist mucous membranes Cardiovascular: RRR no MRGs, with no peripheral edema Respiratory: Normal respiratory effort on room air, clear breath sounds  Abdomen: Soft,non-tender, normal bowel sounds MSK:  2nd-4 digits on right foot removed, wound VAC in place, left AKA Neurologic: Grossly no focal neuro deficit. Psychiatric:Appropriate affect, and mood. Mental status AAOx3   Data Reviewed: Basic Metabolic Panel: Recent Labs  Lab 11/11/18 1837 11/13/18 0206  NA 129* 134*  K 4.7 4.0  CL 91* 100  CO2 26 23  GLUCOSE 406* 193*  BUN 16 11  CREATININE 0.89 0.77  CALCIUM 9.3 8.3*   Liver Function Tests: Recent Labs  Lab 11/11/18 1837  AST 12*  ALT 13  ALKPHOS 85  BILITOT 0.6  PROT 7.4  ALBUMIN 3.2*   No results for input(s): LIPASE, AMYLASE in the last 168 hours. No results for input(s): AMMONIA in the last 168 hours. CBC: Recent Labs  Lab 11/11/18 1837 11/13/18 0206  WBC 14.4* 11.4*  NEUTROABS 11.5*  --   HGB 13.3 10.8*   HCT 41.2 33.3*  MCV 87.8 88.3  PLT 240 208   Cardiac Enzymes: No results for input(s): CKTOTAL, CKMB, CKMBINDEX, TROPONINI in the last 168 hours. BNP (last 3 results) No results for input(s): BNP in the last 8760 hours.  ProBNP (last 3 results) No results for input(s): PROBNP in the last 8760 hours.  CBG: Recent Labs  Lab 11/12/18 2154 11/13/18 0207 11/13/18 0624 11/13/18 1112 11/13/18 1604  GLUCAP 191* 186* 191* 131* 180*    Recent Results (from the past 240 hour(s))  Blood Culture (routine x 2)     Status: None (Preliminary result)   Collection Time: 11/11/18  9:49 PM  Result Value Ref Range Status   Specimen Description BLOOD RIGHT ANTECUBITAL  Final   Special Requests   Final    BOTTLES DRAWN AEROBIC AND ANAEROBIC Blood Culture results may not be optimal due to an inadequate volume of blood received in culture bottles   Culture   Final    NO GROWTH 2 DAYS Performed at [facility], [address] [place], [place] [address]    Report Status PENDING  Incomplete  Blood Culture (routine x 2)     Status: None (Preliminary result)   Collection Time: 11/11/18 10:06 PM  Result Value Ref Range Status   Specimen Description BLOOD RIGHT HAND  Final   Special Requests   Final    BOTTLES DRAWN AEROBIC AND ANAEROBIC Blood Culture adequate volume   Culture   Final    NO GROWTH 2 DAYS Performed at [facility], [address] [place], [place] [address]    Report Status PENDING  Incomplete  MRSA PCR Screening     Status: None   Collection Time: 11/12/18  4:26 AM  Result Value Ref Range Status   MRSA by PCR NEGATIVE NEGATIVE Final    Comment:        The GeneXpert MRSA Assay (FDA approved for NASAL specimens only), is one component of a comprehensive MRSA colonization surveillance program. It is not intended to diagnose MRSA infection nor to guide or monitor treatment for MRSA infections. Performed at [facility], [address] [place],  [place] [address]   Aerobic/Anaerobic Culture (surgical/deep wound)     Status: None (Preliminary result)   Collection Time: 11/12/18  4:35 PM  Result Value Ref Range Status   Specimen Description FOOT  Final   Special Requests RIGHT  Final   Gram Stain   Final    RARE WBC PRESENT,BOTH PMN AND MONONUCLEAR FEW GRAM POSITIVE COCCI RARE GRAM VARIABLE ROD Performed at [facility], [address] [place], [place] [address]    Culture MODERATE SERRATIA MARCESCENS  Final   Report Status PENDING  Incomplete     Studies: Dg Chest Port 1 View  Result Date: 11/12/2018 CLINICAL DATA:  Sepsis EXAM: PORTABLE CHEST 1 VIEW COMPARISON:  06/10/2017 FINDINGS: Shallow inspiration. Cardiac enlargement. No vascular congestion, edema, or consolidation. No blunting of costophrenic angles. No pneumothorax. Mediastinal contours appear intact. IMPRESSION: Cardiac enlargement. No evidence of active pulmonary disease. Electronically Signed   By: Burman NievesWilliam  Stevens M.D.   On: 11/12/2018 00:26  Scheduled Meds: . amitriptyline  25 mg Oral QHS  . aspirin EC  81 mg Oral Daily  . clopidogrel  75 mg Oral Q breakfast  . insulin aspart  0-20 Units Subcutaneous Q4H  . insulin glargine  50 Units Subcutaneous Daily  . losartan  100 mg Oral Daily  . metroNIDAZOLE  500 mg Oral Q8H  . multivitamin with minerals  1 tablet Per Tube Daily  . nutrition supplement (JUVEN)  1 packet Oral BID BM  . pravastatin  40 mg Oral Daily  . sertraline  100 mg Oral Daily  . sodium chloride flush  3 mL Intravenous Q12H  . traZODone  50 mg Oral QHS   Continuous Infusions: . sodium chloride    . cefTRIAXone (ROCEPHIN)  IV 2 g (11/13/18 1011)  . lactated ringers 10 mL/hr at 11/12/18 1407    Principal Problem:   Osteomyelitis of third toe of right foot (HCC) Active Problems:   HTN (hypertension)   Amputation of left lower extremity above knee upon examination (HCC)   Diabetes mellitus type 2 in obese [facility])   Atherosclerosis of native  arteries of the extremities with gangrene (HCC)      Laverna Peace  [facility]

## 2018-11-13 NOTE — Anesthesia Postprocedure Evaluation (Signed)
Anesthesia Post Note  Patient: Danielle Buchanan  Procedure(s) Performed: Rosalin Hawking OF Right LEG (Right Leg Lower) AMPUTATION OF second, third and fifth toes (Right Foot)     Patient location during evaluation: PACU Anesthesia Type: General Level of consciousness: awake and alert Pain management: pain level controlled Vital Signs Assessment: post-procedure vital signs reviewed and stable Respiratory status: spontaneous breathing, nonlabored ventilation, respiratory function stable and patient connected to nasal cannula oxygen Cardiovascular status: blood pressure returned to baseline and stable Postop Assessment: no apparent nausea or vomiting Anesthetic complications: no    Last Vitals:  Vitals:   11/13/18 0552 11/13/18 0915  BP: 116/62 105/60  Pulse: 72 66  Resp: 18 12  Temp: 37.2 C 36.8 C  SpO2: 94% 95%    Last Pain:  Vitals:   11/13/18 0915  TempSrc: Oral  PainSc: 0-No pain                 Shatima Zalar L Gahel Safley

## 2018-11-13 NOTE — Discharge Instructions (Signed)
Correction Insulin    Correction insulin, also called corrective insulin or a supplemental dose, is a small amount of insulin that can be used to lower your blood sugar (glucose) if it is too high. You may be instructed to check your blood glucose at certain times of the day and to use correction insulin as needed to lower your blood glucose to your target range.  Correction insulin is primarily used as part of diabetes management. It may also be prescribed for people who do not have diabetes.  What is a correction scale?  A correction scale, also called a sliding scale, is prescribed by your health care provider to help you determine when you need correction insulin. Your correction scale is based on your individual treatment goals, and it has two parts:   Ranges of blood glucose levels.   How much correction insulin to give yourself if your blood sugar falls within a certain range.  If your blood glucose is in your desired range, you will not need correction insulin and you should take your normal insulin dose.  What type of insulin do I need?  Your health care provider may prescribe rapid-acting or short-acting insulin for you to use as correction insulin.  Rapid-acting insulin:   Starts working quickly, in as little as 5 minutes.   Can last for 3-6 hours.   Works well when taken right before a meal to quickly lower blood glucose.  Short-acting insulin:   Starts working in about 30 minutes.   Can last for 6-8 hours.   Should be taken about 30 minutes before you start eating a meal.  Talk with your health care provider or pharmacist about which type of correction insulin to take and when to take it. If you use insulin to control your diabetes, you should use correction insulin in addition to the longer-acting (basal) insulin that you normally use.  How do I manage my blood glucose with correction insulin?  Giving a correction dose   Check your blood glucose as directed by your health care provider.   Use  your correction scale to find the range that your blood glucose is in.   Identify the units of insulin that match your blood glucose range.   Make sure you have food available that you can eat in the next 15-30 minutes, after your correction dose.   Give yourself the dose of correction insulin that your health care provider has prescribed in your correction scale. Always make sure you are using the right type of insulin.  ? If your correction insulin is rapid-acting, start eating a meal within 15 minutes after your correction dose to keep your blood glucose from getting too low.  ? If your correction insulin is short-acting, start eating a meal within 30 minutes after your correction dose to keep your blood glucose from getting too low.  Keeping a blood glucose log     Write down your blood glucose test results and the amount of insulin that you give yourself. Do this every time you check blood glucose or take insulin. Bring this log with you to your medical visits. This information will help your health care provider to manage your medicines.   Note anything that may affect your blood glucose, such as:  ? Changes in normal exercise or activity.  ? Changes in your normal schedule, such as changes in your sleep routine, going on vacation, changing your diet, or holidays.  ? New over-the-counter or prescription medicines.  ?   Illness, stress, or anxiety.  ? Changes in the time that you took your medicine or insulin.  ? Changes in your meals, such as skipping a meal, having a late meal, or dining out.  ? Eating things that may affect blood glucose, such as snacks, meal portions that are larger than normal, drinks that contain sugar, or eating less than usual.  What do I need to know about hyperglycemia and hypoglycemia?  What is hyperglycemia?  Hyperglycemia, also called high blood glucose, occurs when blood glucose is too high. Make sure you know the early signs of hyperglycemia, such as:   Increased  thirst.   Hunger.   Feeling very tired.   Needing to urinate more often than usual.   Blurry vision.  What is hypoglycemia?  Hypoglycemia is also called low blood glucose. Be aware of "stacking" your insulin doses. This happens when you correct a high blood glucose by giving yourself extra insulin too soon after a previous correction dose or mealtime dose. This may cause you to have too much insulin in your body and may put you at risk for hypoglycemia. Hypoglycemia occurs with a blood glucose level at or below 70 mg/dL (3.9 mmol/L).  It is important to know the symptoms of hypoglycemia and treat it right away. Always have a 15-gram rapid-acting carbohydrate snack with you to treat low blood glucose. Family members and close friends should also know the symptoms and should understand how to treat hypoglycemia, in case you are not able to treat yourself.  What are the symptoms of hypoglycemia?  Hypoglycemia symptoms can include:   Hunger.   Anxiety.   Sweating and feeling clammy.   Confusion.   Dizziness or light-headedness.   Sleepiness.   Nausea.   Increased heart rate.   Headache.   Blurry vision.   Jerky movements that you cannot control (seizure).   Nightmares.   Tingling or numbness around the mouth, lips, or tongue.   A change in speech.   Decreased ability to concentrate.   A change in coordination.   Restless sleep.   Tremors or shakes.   Fainting.   Irritability.  How do I treat hypoglycemia?  If you are alert and able to swallow safely, follow the 15:15 rule:   Take 15 grams of a rapid-acting carbohydrate. Rapid-acting options include:  ? 1 tube of glucose gel.  ? 3 glucose pills.  ? 6-8 pieces of hard candy.  ? 4 oz (120 mL) of fruit juice.  ? 4 oz (120 mL) of regular (not diet) soda.   Check your blood glucose 15 minutes after you take the carbohydrate.  ? If the repeat blood glucose level is still at or below 70 mg/dL (3.9 mmol/L), take 15 grams of a carbohydrate again.  ? If  your blood glucose level does not increase above 70 mg/dL (3.9 mmol/L) after 3 tries, seek emergency medical care.   After your blood glucose level returns to normal, eat a meal or a snack within 1 hour.    How do I treat severe hypoglycemia?  Severe hypoglycemia is when your blood glucose level is at or below 54 mg/dL (3 mmol/L). Severe hypoglycemia is an emergency. Do not wait to see if the symptoms will go away. Get medical help right away. Call your local emergency services (911 in the [place] Do not drive yourself to the hospital.  If you have severe hypoglycemia and you cannot eat or drink, you may need an injection of glucagon. A   health care provider may prescribe insulin because:  Keeping your blood glucose in the target range is important for your overall health.  You are taking medicines that cause your blood glucose to be higher than normal. Contact a health care provider if:  You develop low blood glucose that you are not able to treat yourself.  You have high blood glucose that you are not able to correct with correction insulin.  Your blood glucose is often too low.  You used emergency glucagon to treat low blood glucose. Get help right away if:  You become unresponsive. If this happens, someone else should call emergency services (911 in the [place] right away.  Your blood glucose is lower than 54 mg/dL (3.0 mmol/L).  You become confused or you have trouble thinking clearly.  You have difficulty  breathing. Summary  Correction insulin is a small amount of insulin that can be used to lower your blood sugar (glucose) if it is too high.  Talk with your health care provider or pharmacist about which type of correction insulin to take and when to take it. If you use insulin to control your diabetes, you should use correction insulin in addition to the longer-acting (basal) insulin that you normally use.  You may be instructed to check your blood glucose at certain times of the day and to use correction insulin as needed to lower your blood glucose to your target range. Always keep a log of your blood glucose values and the amount of insulin that you used.  It is important to know the symptoms of hypoglycemia and treat it right away. Always have a 15-gram rapid-acting carbohydrate snack with you to treat low blood glucose. Family members and close friends should also know the symptoms and should understand how to treat hypoglycemia, in case you are not able to treat yourself. This information is not intended to replace advice given to you by your health care provider. Make sure you discuss any questions you have with your health care provider. Document Released: 02/15/2011 Document Revised: 06/22/2016 Document Reviewed: 06/22/2016 Elsevier Interactive Patient Education  2019 [place].   Blood Glucose Monitoring, Adult Monitoring your blood sugar (glucose) is an important part of managing your diabetes (diabetes mellitus). Blood glucose monitoring involves checking your blood glucose as often as directed and keeping a record (log) of your results over time. Checking your blood glucose regularly and keeping a blood glucose log can:  Help you and your health care provider adjust your diabetes management plan as needed, including your medicines or insulin.  Help you understand how food, exercise, illnesses, and medicines affect your blood glucose.  Let you know what your blood glucose is at  any time. You can quickly find out if you have low blood glucose (hypoglycemia) or high blood glucose (hyperglycemia). Your health care provider will set individualized treatment goals for you. Your goals will be based on your age, other medical conditions you have, and how you respond to diabetes treatment. Generally, the goal of treatment is to maintain the following blood glucose levels:  Before meals (preprandial): 80-130 mg/dL (4.4-7.2 mmol/L).  After meals (postprandial): below 180 mg/dL (10 mmol/L).  A1c level: less than 7%. Supplies needed:  Blood glucose meter.  Test strips for your meter. Each meter has its own strips. You must use the strips that came with your meter.  A needle to prick your finger (lancet). Do not use a lancet more than one time.  A device that holds the  lancet (lancing device).  A journal or log book to write down your results. How to check your blood glucose  1. Wash your hands with soap and water. 2. Prick the side of your finger (not the tip) with the lancet. Use a different finger each time. 3. Gently rub the finger until a small drop of blood appears. 4. Follow instructions that come with your meter for inserting the test strip, applying blood to the strip, and using your blood glucose meter. 5. Write down your result and any notes. Some meters allow you to use areas of your body other than your finger (alternative sites) to test your blood. The most common alternative sites are:  Forearm.  Thigh.  Palm of the hand. If you think you may have hypoglycemia, or if you have a history of not knowing when your blood glucose is getting low (hypoglycemia unawareness), do not use alternative sites. Use your finger instead. Alternative sites may not be as accurate as the fingers, because blood flow is slower in these areas. This means that the result you get may be delayed, and it may be different from the result that you would get from your finger. Follow  these instructions at home: Blood glucose log   Every time you check your blood glucose, write down your result. Also write down any notes about things that may be affecting your blood glucose, such as your diet and exercise for the day. This information can help you and your health care provider: ? Look for patterns in your blood glucose over time. ? Adjust your diabetes management plan as needed.  Check if your meter allows you to download your records to a computer. Most glucose meters store a record of glucose readings in the meter. If you have type 1 diabetes:  Check your blood glucose 2 or more times a day.  Also check your blood glucose: ? Before every insulin injection. ? Before and after exercise. ? Before meals. ? 2 hours after a meal. ? Occasionally between 2:00 a.m. and 3:00 a.m., as directed. ? Before potentially dangerous tasks, like driving or using heavy machinery. ? At bedtime.  You may need to check your blood glucose more often, up to 6-10 times a day, if you: ? Use an insulin pump. ? Need multiple daily injections (MDI). ? Have diabetes that is not well-controlled. ? Are ill. ? Have a history of severe hypoglycemia. ? Have hypoglycemia unawareness. If you have type 2 diabetes:  If you take insulin or other diabetes medicines, check your blood glucose 2 or more times a day.  If you are on intensive insulin therapy, check your blood glucose 4 or more times a day. Occasionally, you may also need to check between 2:00 a.m. and 3:00 a.m., as directed.  Also check your blood glucose: ? Before and after exercise. ? Before potentially dangerous tasks, like driving or using heavy machinery.  You may need to check your blood glucose more often if: ? Your medicine is being adjusted. ? Your diabetes is not well-controlled. ? You are ill. General tips  Always keep your supplies with you.  If you have questions or need help, all blood glucose meters have a 24-hour  "hotline" phone number that you can call. You may also contact your health care provider.  After you use a few boxes of test strips, adjust (calibrate) your blood glucose meter by following instructions that came with your meter. Contact a health care provider if:  Your  blood glucose is at or above 240 mg/dL (13.3 mmol/L) for 2 days in a row.  You have been sick or have had a fever for 2 days or longer, and you are not getting better.  You have any of the following problems for more than 6 hours: ? You cannot eat or drink. ? You have nausea or vomiting. ? You have diarrhea. Get help right away if:  Your blood glucose is lower than 54 mg/dL (3 mmol/L).  You become confused or you have trouble thinking clearly.  You have difficulty breathing.  You have moderate or large ketone levels in your urine. Summary  Monitoring your blood sugar (glucose) is an important part of managing your diabetes (diabetes mellitus).  Blood glucose monitoring involves checking your blood glucose as often as directed and keeping a record (log) of your results over time.  Your health care provider will set individualized treatment goals for you. Your goals will be based on your age, other medical conditions you have, and how you respond to diabetes treatment.  Every time you check your blood glucose, write down your result. Also write down any notes about things that may be affecting your blood glucose, such as your diet and exercise for the day. This information is not intended to replace advice given to you by your health care provider. Make sure you discuss any questions you have with your health care provider. Document Released: 09/27/2003 Document Revised: 08/05/2017 Document Reviewed: 03/05/2016 Elsevier Interactive Patient Education  2019 [place].   Diabetes Mellitus and Exercise Exercising regularly is important for your overall health, especially when you have diabetes (diabetes mellitus).  Exercising is not only about losing weight. It has many other health benefits, such as increasing muscle strength and bone density and reducing body fat and stress. This leads to improved fitness, flexibility, and endurance, all of which result in better overall health. Exercise has additional benefits for people with diabetes, including:  Reducing appetite.  Helping to lower and control blood glucose.  Lowering blood pressure.  Helping to control amounts of fatty substances (lipids) in the blood, such as cholesterol and triglycerides.  Helping the body to respond better to insulin (improving insulin sensitivity).  Reducing how much insulin the body needs.  Decreasing the risk for heart disease by: ? Lowering cholesterol and triglyceride levels. ? Increasing the levels of good cholesterol. ? Lowering blood glucose levels. What is my activity plan? Your health care provider or certified diabetes educator can help you make a plan for the type and frequency of exercise (activity plan) that works for you. Make sure that you:  Do at least 150 minutes of moderate-intensity or vigorous-intensity exercise each week. This could be brisk walking, biking, or water aerobics. ? Do stretching and strength exercises, such as yoga or weightlifting, at least 2 times a week. ? Spread out your activity over at least 3 days of the week.  Get some form of physical activity every day. ? Do not go more than 2 days in a row without some kind of physical activity. ? Avoid being inactive for more than 30 minutes at a time. Take frequent breaks to walk or stretch.  Choose a type of exercise or activity that you enjoy, and set realistic goals.  Start slowly, and gradually increase the intensity of your exercise over time. What do I need to know about managing my diabetes?   Check your blood glucose before and after exercising. ? If your blood glucose is  240 mg/dL (13.3 mmol/L) or higher before you exercise,  check your urine for ketones. If you have ketones in your urine, do not exercise until your blood glucose returns to normal. ? If your blood glucose is 100 mg/dL (5.6 mmol/L) or lower, eat a snack containing 15-20 grams of carbohydrate. Check your blood glucose 15 minutes after the snack to make sure that your level is above 100 mg/dL (5.6 mmol/L) before you start your exercise.  Know the symptoms of low blood glucose (hypoglycemia) and how to treat it. Your risk for hypoglycemia increases during and after exercise. Common symptoms of hypoglycemia can include: ? Hunger. ? Anxiety. ? Sweating and feeling clammy. ? Confusion. ? Dizziness or feeling light-headed. ? Increased heart rate or palpitations. ? Blurry vision. ? Tingling or numbness around the mouth, lips, or tongue. ? Tremors or shakes. ? Irritability.  Keep a rapid-acting carbohydrate snack available before, during, and after exercise to help prevent or treat hypoglycemia.  Avoid injecting insulin into areas of the body that are going to be exercised. For example, avoid injecting insulin into: ? The arms, when playing tennis. ? The legs, when jogging.  Keep records of your exercise habits. Doing this can help you and your health care provider adjust your diabetes management plan as needed. Write down: ? Food that you eat before and after you exercise. ? Blood glucose levels before and after you exercise. ? The type and amount of exercise you have done. ? When your insulin is expected to peak, if you use insulin. Avoid exercising at times when your insulin is peaking.  When you start a new exercise or activity, work with your health care provider to make sure the activity is safe for you, and to adjust your insulin, medicines, or food intake as needed.  Drink plenty of water while you exercise to prevent dehydration or heat stroke. Drink enough fluid to keep your urine clear or pale yellow. Summary  Exercising regularly is  important for your overall health, especially when you have diabetes (diabetes mellitus).  Exercising has many health benefits, such as increasing muscle strength and bone density and reducing body fat and stress.  Your health care provider or certified diabetes educator can help you make a plan for the type and frequency of exercise (activity plan) that works for you.  When you start a new exercise or activity, work with your health care provider to make sure the activity is safe for you, and to adjust your insulin, medicines, or food intake as needed. This information is not intended to replace advice given to you by your health care provider. Make sure you discuss any questions you have with your health care provider. Document Released: 12/15/2003 Document Revised: 04/04/2017 Document Reviewed: 03/05/2016 Elsevier Interactive Patient Education  2019 [place] change M/W/F starting on 11/14/2018 by home health nurse.  Heel weight bearing only with [employer] shoe.

## 2018-11-14 ENCOUNTER — Inpatient Hospital Stay: Payer: Self-pay

## 2018-11-14 ENCOUNTER — Telehealth: Payer: Self-pay | Admitting: Surgery

## 2018-11-14 DIAGNOSIS — Z89512 Acquired absence of left leg below knee: Secondary | ICD-10-CM

## 2018-11-14 DIAGNOSIS — M869 Osteomyelitis, unspecified: Secondary | ICD-10-CM

## 2018-11-14 DIAGNOSIS — E1151 Type 2 diabetes mellitus with diabetic peripheral angiopathy without gangrene: Secondary | ICD-10-CM

## 2018-11-14 DIAGNOSIS — F17211 Nicotine dependence, cigarettes, in remission: Secondary | ICD-10-CM

## 2018-11-14 DIAGNOSIS — B9689 Other specified bacterial agents as the cause of diseases classified elsewhere: Secondary | ICD-10-CM

## 2018-11-14 DIAGNOSIS — Z89421 Acquired absence of other right toe(s): Secondary | ICD-10-CM

## 2018-11-14 DIAGNOSIS — Z91011 Allergy to milk products: Secondary | ICD-10-CM

## 2018-11-14 DIAGNOSIS — E1169 Type 2 diabetes mellitus with other specified complication: Secondary | ICD-10-CM

## 2018-11-14 DIAGNOSIS — I739 Peripheral vascular disease, unspecified: Secondary | ICD-10-CM

## 2018-11-14 DIAGNOSIS — I10 Essential (primary) hypertension: Secondary | ICD-10-CM

## 2018-11-14 DIAGNOSIS — Z881 Allergy status to other antibiotic agents status: Secondary | ICD-10-CM

## 2018-11-14 LAB — CBC
HEMATOCRIT: 33.3 % — AB (ref 36.0–46.0)
Hemoglobin: 10.7 g/dL — ABNORMAL LOW (ref 12.0–15.0)
MCH: 28.1 pg (ref 26.0–34.0)
MCHC: 32.1 g/dL (ref 30.0–36.0)
MCV: 87.4 fL (ref 80.0–100.0)
NRBC: 0 % (ref 0.0–0.2)
Platelets: 209 10*3/uL (ref 150–400)
RBC: 3.81 MIL/uL — ABNORMAL LOW (ref 3.87–5.11)
RDW: 13 % (ref 11.5–15.5)
WBC: 11 10*3/uL — ABNORMAL HIGH (ref 4.0–10.5)

## 2018-11-14 LAB — GLUCOSE, CAPILLARY
GLUCOSE-CAPILLARY: 160 mg/dL — AB (ref 70–99)
Glucose-Capillary: 155 mg/dL — ABNORMAL HIGH (ref 70–99)
Glucose-Capillary: 230 mg/dL — ABNORMAL HIGH (ref 70–99)
Glucose-Capillary: 246 mg/dL — ABNORMAL HIGH (ref 70–99)
Glucose-Capillary: 246 mg/dL — ABNORMAL HIGH (ref 70–99)
Glucose-Capillary: 267 mg/dL — ABNORMAL HIGH (ref 70–99)

## 2018-11-14 MED ORDER — INSULIN ASPART 100 UNIT/ML ~~LOC~~ SOLN
0.0000 [IU] | Freq: Three times a day (TID) | SUBCUTANEOUS | Status: DC
  Administered 2018-11-14 (×2): 5 [IU] via SUBCUTANEOUS
  Administered 2018-11-15: 8 [IU] via SUBCUTANEOUS
  Administered 2018-11-15: 3 [IU] via SUBCUTANEOUS
  Administered 2018-11-15: 5 [IU] via SUBCUTANEOUS
  Administered 2018-11-16: 3 [IU] via SUBCUTANEOUS
  Administered 2018-11-16 (×2): 5 [IU] via SUBCUTANEOUS
  Administered 2018-11-17: 3 [IU] via SUBCUTANEOUS
  Administered 2018-11-17: 8 [IU] via SUBCUTANEOUS
  Administered 2018-11-17 – 2018-11-18 (×2): 5 [IU] via SUBCUTANEOUS
  Administered 2018-11-18 (×2): 3 [IU] via SUBCUTANEOUS
  Administered 2018-11-19: 5 [IU] via SUBCUTANEOUS
  Administered 2018-11-19: 15 [IU] via SUBCUTANEOUS
  Administered 2018-11-19: 3 [IU] via SUBCUTANEOUS
  Administered 2018-11-20: 5 [IU] via SUBCUTANEOUS
  Administered 2018-11-20: 3 [IU] via SUBCUTANEOUS
  Administered 2018-11-20: 2 [IU] via SUBCUTANEOUS
  Administered 2018-11-21: 3 [IU] via SUBCUTANEOUS
  Administered 2018-11-21: 2 [IU] via SUBCUTANEOUS
  Administered 2018-11-21: 5 [IU] via SUBCUTANEOUS
  Administered 2018-11-22: 3 [IU] via SUBCUTANEOUS
  Administered 2018-11-22: 8 [IU] via SUBCUTANEOUS
  Administered 2018-11-22: 2 [IU] via SUBCUTANEOUS

## 2018-11-14 MED ORDER — SODIUM CHLORIDE 0.9 % IV SOLN
2.0000 g | INTRAVENOUS | Status: DC
  Administered 2018-11-15 – 2018-11-22 (×7): 2 g via INTRAVENOUS
  Filled 2018-11-14 (×8): qty 20

## 2018-11-14 MED ORDER — SODIUM CHLORIDE 0.9% FLUSH
10.0000 mL | INTRAVENOUS | Status: DC | PRN
  Administered 2018-11-19: 20 mL
  Filled 2018-11-14: qty 40

## 2018-11-14 NOTE — Evaluation (Signed)
Physical Therapy Evaluation Patient Details Name: Danielle Buchanan MRN: [id number] DOB: Feb 28, 1956 Today's Date: 11/14/2018   History of Present Illness  63 yo female with history of injury to R 3rd toe in January, wound worsened and she now received amputation of R 2nd/3rd/5th toes on 11/12/2018. PMH DM, HTN, L AKA   Clinical Impression  Patient received in bed, very pleasant and motivated to participate with therapy today. Noted dressing was bleeding and notified RN, who placed additional bandages over wound site prior to PT. She was able to complete bed mobility with MinA but demonstrated posterior LOB while sitting at EOB requiring Min guard/VC to correct. Made multiple attempts at sit to stand both with RW and with bear hug technique, unable to clear buttocks from bed and patient with very poor glute activation and initiation today. Able to return to bed with MinA, and rolled side to side with min guard for repositioning of chuck pads. She was left positioned to comfort in bed with all needs met, bed alarm active. She will continue to benefit from skilled PT services in the acute setting, also recommend intensive therapies in the CIR setting moving forward.     Follow Up Recommendations CIR    Equipment Recommendations  Other (comment)(defer to next venue )    Recommendations for Other Services       Precautions / Restrictions Precautions Precautions: Fall Restrictions Weight Bearing Restrictions: Yes RLE Weight Bearing: Weight bearing as tolerated Other Position/Activity Restrictions: WBAT through heel in post-op shoe       Mobility  Bed Mobility Overal bed mobility: Needs Assistance Bed Mobility: Supine to Sit;Sit to Supine     Supine to sit: Min assist Sit to supine: Min assist   General bed mobility comments: MinA to scoot hips around and bring trunk up at EOB; intermittent posterior lean/LOB requiring min guard/VC to correct   Transfers Overall transfer level: Needs  assistance Equipment used: Rolling walker (2 wheeled);None Transfers: Sit to/from Stand Sit to Stand: Total assist;From elevated surface         General transfer comment: attempted sit to stand multiple times from elevated bed, unable to clear buttocks from bed using RW or with bear hug technique of +1; unable to attempt lateral scoot due to not having drop arm chair   Ambulation/Gait             General Gait Details: deferred   Stairs            Wheelchair Mobility    Modified Rankin (Stroke Patients Only)       Balance Overall balance assessment: Needs assistance   Sitting balance-Leahy Scale: Fair Sitting balance - Comments: min guard/VC to maintain upright  Postural control: Posterior lean   Standing balance-Leahy Scale: Zero                               Pertinent Vitals/Pain Pain Assessment: No/denies pain    Home Living Family/patient expects to be discharged to:: Private residence Living Arrangements: Alone Available Help at Discharge: Family Type of Home: Apartment Home Access: Level entry     Home Layout: One level Home Equipment: Environmental consultant - 4 wheels;Shower seat;Wheelchair - manual      Prior Function Level of Independence: Needs assistance   Gait / Transfers Assistance Needed: reports she was Mod(I)   ADL's / Homemaking Assistance Needed: reports she was Mod(I)        Hand Dominance  Extremity/Trunk Assessment   Upper Extremity Assessment Upper Extremity Assessment: Defer to OT evaluation    Lower Extremity Assessment Lower Extremity Assessment: Generalized weakness    Cervical / Trunk Assessment Cervical / Trunk Assessment: Kyphotic  Communication   Communication: No difficulties  Cognition Arousal/Alertness: Awake/alert Behavior During Therapy: WFL for tasks assessed/performed Overall Cognitive Status: Within Functional Limits for tasks assessed                                         General Comments      Exercises     Assessment/Plan    PT Assessment Patient needs continued PT services  PT Problem List Decreased strength;Decreased coordination;Decreased activity tolerance;Decreased knowledge of use of DME;Decreased balance;Decreased safety awareness;Decreased mobility;Decreased knowledge of precautions       PT Treatment Interventions DME instruction;Therapeutic exercise;Gait training;Balance training;Wheelchair mobility training;Stair training;Neuromuscular re-education;Functional mobility training;Therapeutic activities;Patient/family education    PT Goals (Current goals can be found in the Care Plan section)  Acute Rehab PT Goals Patient Stated Goal: go home  PT Goal Formulation: With patient Time For Goal Achievement: 11/28/18 Potential to Achieve Goals: Good    Frequency Min 3X/week   Barriers to discharge        Co-evaluation               AM-PAC PT "6 Clicks" Mobility  Outcome Measure Help needed turning from your back to your side while in a flat bed without using bedrails?: None Help needed moving from lying on your back to sitting on the side of a flat bed without using bedrails?: A Little Help needed moving to and from a bed to a chair (including a wheelchair)?: A Lot Help needed standing up from a chair using your arms (e.g., wheelchair or bedside chair)?: Total Help needed to walk in hospital room?: Total Help needed climbing 3-5 steps with a railing? : Total 6 Click Score: 12    End of Session Equipment Utilized During Treatment: Gait belt Activity Tolerance: Patient tolerated treatment well Patient left: in bed;with call bell/phone within reach;with bed alarm set Nurse Communication: Mobility status PT Visit Diagnosis: Unsteadiness on feet (R26.81);Other abnormalities of gait and mobility (R26.89);Muscle weakness (generalized) (M62.81)    Time: 1000-1032 PT Time Calculation (min) (ACUTE ONLY): 32 min   Charges:   PT  Evaluation $PT Eval Moderate Complexity: 1 Mod PT Treatments $Therapeutic Activity: 8-22 mins       Deniece Ree PT, DPT, CBIS  Supplemental Physical Therapist [place]    Pager [phone] Acute Rehab Office [phone]

## 2018-11-14 NOTE — Progress Notes (Signed)
Dressing changed per MD order. Moderate sanguineous drainage noted. Pt denied pain, tolerated well. Will endorse accordingly.

## 2018-11-14 NOTE — Progress Notes (Signed)
PHARMACY CONSULT NOTE FOR:  OUTPATIENT  PARENTERAL ANTIBIOTIC THERAPY (OPAT)  Indication: Osteomyelitis  Regimen: Ceftriaxone 2 gm every 24 hours End date: 12/24/2018  IV antibiotic discharge orders are pended. To discharging provider:  please sign these orders via discharge navigator,  Select New Orders & click on the button choice - Manage This Unsigned Work.     Thank you for allowing pharmacy to be a part of this patient's care.  Sharin Mons, PharmD, BCPS, BCIDP Infectious Diseases Clinical Pharmacist Phone: [phone] 11/14/2018, 2:43 PM

## 2018-11-14 NOTE — Telephone Encounter (Signed)
sch appt lvm mld ltr 12/01/2018 945am p/o NP

## 2018-11-14 NOTE — Plan of Care (Signed)
  Problem: Clinical Measurements: Goal: Ability to maintain clinical measurements within normal limits will improve Outcome: Progressing Goal: Respiratory complications will improve Outcome: Progressing Goal: Cardiovascular complication will be avoided Outcome: Progressing   Problem: Elimination: Goal: Will not experience complications related to urinary retention Outcome: Progressing   Problem: Pain Managment: Goal: General experience of comfort will improve Outcome: Progressing   Problem: Safety: Goal: Ability to remain free from injury will improve Outcome: Progressing   

## 2018-11-14 NOTE — Telephone Encounter (Signed)
-----   Message from Westley Hummer, RN sent at 11/14/2018 11:45 AM EST ----- Regarding: f/u  ----- Message ----- From: Nada Libman, MD Sent: 11/14/2018   8:34 AM EST To: [facility] Charge Pool  11-14-2018:  f/u office 2 weeks for wound check

## 2018-11-14 NOTE — Progress Notes (Signed)
[facility] PROGRESS NOTE  Danielle Buchanan FAO:[id number] DOB:Danielle Contes 07/01/1956 DOA: 11/11/2018 PCP: Shirlean MylarWebb, Carol, MD  Assessment/Plan: 1. Sepsis secondary to necrotic right third toe/osteomyelitis. Sepsis resolved.  s/p transmetatarsal amputation with wound VAC placement 2/5.Wound vac removed today. Discussed with Dr.Brabham toe culture obtained intraoperatively was bone culture. ID consulted to assist given GPCs and likely need for prolonged IV abx as outpatient. PT recommends CIR. Continue IV ceftriaxone. D/c Flagyl while awaiting cultures for intraoperative pathology.  Sepsis physiology resolving with normalization of respiratory rate and improvement in white count as it downtrends, vascular surgery following, will need PT eval for home disposition  2. Severe stenosis of right peroneal artery, status post balloon angioplasty on 2/6.  No residual stenosis  3. Acute blood loss anemia.  Suspect patient has chronic anemia as has had low hemoglobin of 08/2018.  Prior to admission hemoglobin was 13.3, suspect this was hemoconcentrated?.  Current hemoglobin 10.8, will continue to monitor CBC.   4. Type 2 diabetes, poorly controlled.  A1c 10.9% (11/12/2018).  On a significant amount of insulin at home (190 units total daily).  Only on lantus 50 U here (1st dose today, FS 267) reduced SSI to moderate scale given drop BS from 267 to 160 with short acting insulin to avoid hypoglycemia now that we are receiving long acting insulin  5. Hypertension, stable.  Continue home losartan  6. Hyperlipidemia, stable continue home pravastatin  7. Depression, stable continue home Zoloft and Elavil   Code Status: full code Family Communication: updated son at bedside Disposition Plan: continue IV antibiotics, monitoring cultures, ID consulted   Consultants:  Vascular Surgery , ID, CIR  Procedures:  Transmetatarsal amputation of right foot (second through fifth digits) on 2/5 with wound VAC  placement  Antibiotics:  IV ceftriaxone 2/5--  IV Flagyl 2/4-2/6  IV Zosyn 2/4 complete  IV Vanco 2/4 complete  HPI/Subjective:  Danielle Buchanan is a 63 y.o. year old female with medical history  63 y.o. year old female with medical history significant for DM2, HTN, PAD s/p L AKA in 2018, R 4th toe amputation and who presented on 11/11/2018 with concern for grossly infected/necrotic third right toe after podiatrist outpatient visit and was found to have necrotic third right toe.  Outpatient XR documented as  bony destruction suggestive of osteomyelitis at the middle phalanx of the right third toe. No gas in soft tissues  No complaints this am. Pain controlled.   Objective: Vitals:   11/13/18 2124 11/14/18 0430  BP: 140/67 (!) 142/72  Pulse: 71 80  Resp: 18 18  Temp: 98.8 F (37.1 C) 99.9 F (37.7 C)  SpO2: 100% 90%    Intake/Output Summary (Last 24 hours) at 11/14/2018 [id number] Last data filed at 11/14/2018 0120 Gross per 24 hour  Intake 960 ml  Output 1075 ml  Net -115 ml   Filed Weights   11/12/18 1357  Weight: 95.3 kg    Exam:  Constitutional:normal appearing female, no acute distress Eyes: EOMI, anicteric, normal conjunctivae ENMT: Oropharynx with moist mucous membranes Cardiovascular: RRR no MRGs, with no peripheral edema Respiratory: Normal respiratory effort on room air, clear breath sounds  Abdomen: Soft,non-tender, normal bowel sounds MSK:  2nd-4 digits on right foot removed, dressing in place Neurologic: Grossly no focal neuro deficit. Psychiatric:Appropriate affect, and mood. Mental status AAOx3   Data Reviewed: Basic Metabolic Panel: Recent Labs  Lab 11/11/18 1837 11/13/18 0206  NA 129* 134*  K 4.7 4.0  CL 91* 100  CO2 26  23  GLUCOSE 406* 193*  BUN 16 11  CREATININE 0.89 0.77  CALCIUM 9.3 8.3*   Liver Function Tests: Recent Labs  Lab 11/11/18 1837  AST 12*  ALT 13  ALKPHOS 85  BILITOT 0.6  PROT 7.4  ALBUMIN 3.2*   No results for  input(s): LIPASE, AMYLASE in the last 168 hours. No results for input(s): AMMONIA in the last 168 hours. CBC: Recent Labs  Lab 11/11/18 1837 11/13/18 0206 11/14/18 0345  WBC 14.4* 11.4* 11.0*  NEUTROABS 11.5*  --   --   HGB 13.3 10.8* 10.7*  HCT 41.2 33.3* 33.3*  MCV 87.8 88.3 87.4  PLT 240 208 209   Cardiac Enzymes: No results for input(s): CKTOTAL, CKMB, CKMBINDEX, TROPONINI in the last 168 hours. BNP (last 3 results) No results for input(s): BNP in the last 8760 hours.  ProBNP (last 3 results) No results for input(s): PROBNP in the last 8760 hours.  CBG: Recent Labs  Lab 11/13/18 1604 11/13/18 2122 11/14/18 0015 11/14/18 0412 11/14/18 0813  GLUCAP 180* 148* 155* 267* 160*    Recent Results (from the past 240 hour(s))  Blood Culture (routine x 2)     Status: None (Preliminary result)   Collection Time: 11/11/18  9:49 PM  Result Value Ref Range Status   Specimen Description BLOOD RIGHT ANTECUBITAL  Final   Special Requests   Final    BOTTLES DRAWN AEROBIC AND ANAEROBIC Blood Culture results may not be optimal due to an inadequate volume of blood received in culture bottles   Culture   Final    NO GROWTH 3 DAYS Performed at [facility], [address] [place], [place] [address]    Report Status PENDING  Incomplete  Blood Culture (routine x 2)     Status: None (Preliminary result)   Collection Time: 11/11/18 10:06 PM  Result Value Ref Range Status   Specimen Description BLOOD RIGHT HAND  Final   Special Requests   Final    BOTTLES DRAWN AEROBIC AND ANAEROBIC Blood Culture adequate volume   Culture   Final    NO GROWTH 3 DAYS Performed at [facility], [address] [place], [place] [address]    Report Status PENDING  Incomplete  MRSA PCR Screening     Status: None   Collection Time: 11/12/18  4:26 AM  Result Value Ref Range Status   MRSA by PCR NEGATIVE NEGATIVE Final    Comment:        The GeneXpert MRSA Assay (FDA approved for NASAL  specimens only), is one component of a comprehensive MRSA colonization surveillance program. It is not intended to diagnose MRSA infection nor to guide or monitor treatment for MRSA infections. Performed at [facility], [address] [place], [place] [address]   Aerobic/Anaerobic Culture (surgical/deep wound)     Status: None (Preliminary result)   Collection Time: 11/12/18  4:35 PM  Result Value Ref Range Status   Specimen Description FOOT  Final   Special Requests RIGHT  Final   Gram Stain   Final    RARE WBC PRESENT,BOTH PMN AND MONONUCLEAR FEW GRAM POSITIVE COCCI RARE GRAM VARIABLE ROD Performed at [facility], [address] [place], [place] [address]    Culture MODERATE SERRATIA MARCESCENS  Final   Report Status PENDING  Incomplete     Studies: No results found.  Scheduled Meds: . amitriptyline  25 mg Oral QHS  . aspirin EC  81  mg Oral Daily  . clopidogrel  75 mg Oral Q breakfast  . insulin aspart  0-15 Units Subcutaneous TID WC  . insulin glargine  50 Units Subcutaneous Daily  . losartan  100 mg Oral Daily  . multivitamin with minerals  1 tablet Per Tube Daily  . nutrition supplement (JUVEN)  1 packet Oral BID BM  . pravastatin  40 mg Oral Daily  . sertraline  100 mg Oral Daily  . sodium chloride flush  3 mL Intravenous Q12H  . traZODone  50 mg Oral QHS   Continuous Infusions: . sodium chloride 250 mL (11/13/18 1730)  . lactated ringers 10 mL/hr at 11/12/18 1407    Principal Problem:   Osteomyelitis of third toe of right foot (HCC) Active Problems:   HTN (hypertension)   Depression   PAD (peripheral artery disease) (HCC)   Amputation of left lower extremity above knee upon examination (HCC)   Benign essential HTN   Diabetes mellitus type 2 in obese [facility])   Atherosclerosis of native arteries of the extremities with gangrene (HCC)      Laverna Peace  [facility]

## 2018-11-14 NOTE — Care Management Important Message (Signed)
Important Message  Patient Details  Name: Danielle Buchanan MRN: [id number] Date of Birth: May 22, 1956   Medicare Important Message Given:  Yes    Dorena Bodo 11/14/2018, 3:33 PM

## 2018-11-14 NOTE — Progress Notes (Signed)
Called by PT in room noted minimal sanguineous drainage. Reinforced with 2 ABD pads and kerlix dressing. No drainage noted post dressing reinforcement. Wound vac dressing ordered and placed in the room. Notified MD, received call back from on call MD. Received verbal order for a dry dressing.

## 2018-11-14 NOTE — Consult Note (Signed)
Physical Medicine and Rehabilitation Consult Reason for Consult: decreased functional mobility Referring Physician: [facility]   HPI: Danielle Buchanan is a 63 y.o.right handed female with history of hypertension, diabetes mellitus, tobacco abuse,left above-the-knee amputation 2018 as well as a recent right fourth toe amputation.Per chart review patient lives alone. One level home. Reportedly modified independent. Presented to 11/12/2018 with ischemic right foot. Angiogram completed patient underwent right transmetatarsal amputation toes 2 through 5 placement of wound VAC to 11/12/2018 per Dr. Myra [facility]Brabham. [facility] Therapy evaluation completed with recommendations of physical medicine rehabilitation consult.  Review of Systems  Constitutional: Negative for chills and fever.  Eyes: Negative for blurred vision and double vision.  Respiratory: Negative for cough and shortness of breath.   Cardiovascular: Positive for palpitations and leg swelling. Negative for chest pain.  Gastrointestinal: Positive for constipation. Negative for nausea and vomiting.  Genitourinary: Negative for dysuria and hematuria.       Bouts of urinary incontinence  Musculoskeletal: Positive for myalgias.  Psychiatric/Behavioral: Positive for depression.  All other systems reviewed and are negative.  Past Medical History:  Diagnosis Date  . Allergic rhinitis   . Depression   . Diabetes (HCC)   . Dyslipidemia   . HTN (hypertension)   . Insomnia   . OSA (obstructive sleep apnea)    no cpap  . Urinary incontinence    Past Surgical History:  Procedure Laterality Date  . ABDOMINAL AORTOGRAM W/LOWER EXTREMITY N/A 06/24/2017   Procedure: ABDOMINAL AORTOGRAM W/LOWER EXTREMITY;  Surgeon: Maeola Harmanain, Brandon Christopher, MD;  [facility];  Service: Cardiovascular;  Laterality: N/A;  . ABDOMINAL AORTOGRAM W/LOWER EXTREMITY Right 10/09/2017   Procedure: ABDOMINAL AORTOGRAM W/LOWER EXTREMITY;   Surgeon: Maeola Harmanain, Brandon Christopher, MD;  [facility];  Service: Cardiovascular;  Laterality: Right;  . AMPUTATION Left 07/23/2017   Procedure: AMPUTATION  BELOW KNEE;  Surgeon: Maeola Harmanain, Brandon Christopher, MD;  [facility];  Service: Vascular;  Laterality: Left;  . AMPUTATION Left 08/12/2017   Procedure: REVISION BELOW KNEE;  Surgeon: Maeola Harmanain, Brandon Christopher, MD;  [facility];  Service: Vascular;  Laterality: Left;  . AMPUTATION Left 08/27/2017   Procedure: left ABOVE KNEE AMPUTATION;  Surgeon: Maeola Harmanain, Brandon Christopher, MD;  [facility];  Service: Vascular;  Laterality: Left;  . AMPUTATION Left 08/30/2017   Procedure: REVISION AMPUTATION ABOVE KNEE;  Surgeon: Nada LibmanBrabham, Vance W, MD;  [facility];  Service: Vascular;  Laterality: Left;  . AMPUTATION Right 11/04/2017   Procedure: AMPUTATION RIGHT FOURTH TOE;  Surgeon: Maeola Harmanain, Brandon Christopher, MD;  [facility];  Service: Vascular;  Laterality: Right;  . AMPUTATION Right 11/12/2018   Procedure: AMPUTATION OF second, third and fifth toes;  Surgeon: Nada LibmanBrabham, Vance W, MD;  Location: [facility] OR;  Service: Vascular;  Laterality: Right;  . APPLICATION OF WOUND VAC Left 08/27/2017   Procedure: APPLICATION OF WOUND VAC;  Surgeon: Maeola Harmanain, Brandon Christopher, MD;  [facility];  Service: Vascular;  Laterality: Left;  . CARPAL TUNNEL RELEASE Right   . COLONOSCOPY    . LOWER EXTREMITY ANGIOGRAM Right 11/12/2018   Procedure: ANGIOGRAM OF Right LEG;  Surgeon: Nada LibmanBrabham, Vance W, MD;  [facility];  Service: Vascular;  Laterality: Right;  . LOWER EXTREMITY INTERVENTION Right 10/09/2017   Procedure: LOWER EXTREMITY INTERVENTION;  Surgeon: Maeola Harmanain, Brandon Christopher, MD;  [facility];  Service: Cardiovascular;  Laterality: Right;  . TOE AMPUTATION Right 11/12/2018   2ND 3RD &  5TH TOE    Family History  Problem Relation Age of Onset  . Heart attack Mother   . Alcohol abuse Mother   . Heart attack Father   . Alcohol  abuse Father   . Heart attack Sister   . Heart attack Brother   . Hypercholesterolemia Sister   . Diabetes Neg Hx    Social History:  reports that she quit smoking about 3 months ago. Her smoking use included cigarettes. She has a 51.00 pack-year smoking history. She has never used smokeless tobacco. She reports that she does not drink alcohol or use drugs. Allergies:  Allergies  Allergen Reactions  . Milk-Related Compounds     incontinence  . Doxycycline Rash   Medications Prior to Admission  Medication Sig Dispense Refill  . amitriptyline (ELAVIL) 25 MG tablet Take 25 mg by mouth at bedtime.    Marland Kitchen. aspirin EC 325 MG tablet Take 325 mg by mouth daily.    . insulin NPH-regular Human (NOVOLIN 70/30) (70-30) 100 UNIT/ML injection 120 units with breakfast, and 90 units with supper, and syringes 3/day. (Patient taking differently: Inject 60-100 Units into the skin 2 (two) times daily with a meal. 60 units with breakfast, and 100 units with supper, and syringes 3/day.) 210 mL 3  . losartan (COZAAR) 100 MG tablet Take 100 mg by mouth daily.    . nicotine polacrilex (NICORELIEF) 2 MG gum Take 2 mg by mouth daily as needed for smoking cessation.     . pravastatin (PRAVACHOL) 20 MG tablet Take 40 mg by mouth daily.     . sertraline (ZOLOFT) 100 MG tablet Take 100 mg by mouth daily.     . traZODone (DESYREL) 50 MG tablet Take 50 mg by mouth at bedtime.    Marland Kitchen. VITAMIN E PO Take 180 mg by mouth daily.    . Zinc 50 MG TABS Take 50 mg by mouth daily.      Home: Home Living Family/patient expects to be discharged to:: Private residence Living Arrangements: Alone Available Help at Discharge: Family Type of Home: Apartment Home Access: Level entry Home Layout: One level Bathroom Shower/Tub: Engineer, manufacturing systemsTub/shower unit Bathroom Toilet: Standard Home Equipment: Environmental consultantWalker - 4 wheels, Shower seat, Wheelchair - manual  Functional History: Prior Function Level of Independence: Needs assistance Gait / Transfers  Assistance Needed: reports she was Mod(I)  ADL's / Homemaking Assistance Needed: reports she was Mod(I) Functional Status:  Mobility: Bed Mobility Overal bed mobility: Needs Assistance Bed Mobility: Supine to Sit, Sit to Supine Supine to sit: Min assist Sit to supine: Min assist General bed mobility comments: MinA to scoot hips around and bring trunk up at EOB; intermittent posterior lean/LOB requiring min guard/VC to correct  Transfers Overall transfer level: Needs assistance Equipment used: Rolling walker (2 wheeled), None Transfers: Sit to/from Stand Sit to Stand: Total assist, From elevated surface General transfer comment: attempted sit to stand multiple times from elevated bed, unable to clear buttocks from bed using RW or with bear hug technique of +1; unable to attempt lateral scoot due to not having drop arm chair  Ambulation/Gait General Gait Details: deferred     ADL:    Cognition: Cognition Overall Cognitive Status: Within Functional Limits for tasks assessed Orientation Level: Oriented X4 Cognition Arousal/Alertness: Awake/alert Behavior During Therapy: WFL for tasks assessed/performed Overall Cognitive Status: Within Functional Limits for tasks assessed  Blood pressure (!) 142/72, pulse 80, temperature 99.9 F (37.7 C), temperature source Oral, resp. rate 18, height 5\' 5"  (1.651  m), weight 95.3 kg, SpO2 90 %. Physical Exam  Constitutional: She is oriented to person, place, and time.  obese  HENT:  Head: Normocephalic.  Eyes: Pupils are equal, round, and reactive to light.  Neck: Normal range of motion.  Cardiovascular: Normal rate.  Respiratory: Effort normal.  GI: Soft.  Musculoskeletal:     Comments: Left residual limb with edema. Right foot tender, dressed  Neurological: She is alert and oriented to person, place, and time. No cranial nerve deficit.  UE 5/5. RLE 2-3/5. LLE 3-4/5. Senses pain in both lower limbs.   Skin:  Right foot dressed, did not  remove dressing  Psychiatric: She has a normal mood and affect.    Results for orders placed or performed during the hospital encounter of 11/11/18 (from the past 24 hour(s))  Glucose, capillary     Status: Abnormal   Collection Time: 11/13/18  4:04 PM  Result Value Ref Range   Glucose-Capillary 180 (H) 70 - 99 mg/dL  Glucose, capillary     Status: Abnormal   Collection Time: 11/13/18  9:22 PM  Result Value Ref Range   Glucose-Capillary 148 (H) 70 - 99 mg/dL  Glucose, capillary     Status: Abnormal   Collection Time: 11/14/18 12:15 AM  Result Value Ref Range   Glucose-Capillary 155 (H) 70 - 99 mg/dL  CBC     Status: Abnormal   Collection Time: 11/14/18  3:45 AM  Result Value Ref Range   WBC 11.0 (H) 4.0 - 10.5 K/uL   RBC 3.81 (L) 3.87 - 5.11 MIL/uL   Hemoglobin 10.7 (L) 12.0 - 15.0 g/dL   HCT 57.0 (L) 17.7 - 93.9 %   MCV 87.4 80.0 - 100.0 fL   MCH 28.1 26.0 - 34.0 pg   MCHC 32.1 30.0 - 36.0 g/dL   RDW 03.0 09.2 - 33.0 %   Platelets 209 150 - 400 K/uL   nRBC 0.0 0.0 - 0.2 %  Glucose, capillary     Status: Abnormal   Collection Time: 11/14/18  4:12 AM  Result Value Ref Range   Glucose-Capillary 267 (H) 70 - 99 mg/dL  Glucose, capillary     Status: Abnormal   Collection Time: 11/14/18  8:13 AM  Result Value Ref Range   Glucose-Capillary 160 (H) 70 - 99 mg/dL  Glucose, capillary     Status: Abnormal   Collection Time: 11/14/18 11:56 AM  Result Value Ref Range   Glucose-Capillary 246 (H) 70 - 99 mg/dL   No results found.   Assessment/Plan: Diagnosis: PAD, gangrene right middle toe,  s/p right TMA 2-5, 11/12/2018 1. Does the need for close, 24 hr/day medical supervision in concert with the patient's rehab needs make it unreasonable for this patient to be served in a less intensive setting? Potentially 2. Co-Morbidities requiring supervision/potential complications: left AKA, HTN, DM, morbid obesity 3. Due to bladder management, bowel management, safety, disease management  and pain management, does the patient require 24 hr/day rehab nursing? Potentially 4. Does the patient require coordinated care of a physician, rehab nurse, PT (1-2 hrs/day, 5 days/week) and OT (1-2 hrs/day, 5 days/week) to address physical and functional deficits in the context of the above medical diagnosis(es)? Potentially Addressing deficits in the following areas: balance, endurance, locomotion, strength, transferring, bowel/bladder control, bathing, dressing, feeding, grooming, toileting and psychosocial support 5. Can the patient actively participate in an intensive therapy program of at least 3 hrs of therapy per day at least 5 days per week? No  6. The potential for patient to make measurable gains while on inpatient rehab is fair 7. Anticipated functional outcomes upon discharge from inpatient rehab are n/a  with PT, n/a with OT, n/a with SLP. 8. Estimated rehab length of stay to reach the above functional goals is: n/a 9. Anticipated D/C setting: Other 10. Anticipated post D/C treatments: HH therapy 11. Overall Rehab/Functional Prognosis: good and fair  RECOMMENDATIONS: This patient's condition is appropriate for continued rehabilitative care in the following setting: SNF Patient has agreed to participate in recommended program. Potentially Note that insurance prior authorization may be required for reimbursement for recommended care.  Comment: Pt with limited mobility prior to this hospitalization and only intermittent social support. Furthermore, did not appear interested in putting forth the effort required to participate in our program.   Ranelle Oyster, MD, [facility] Physical Medicine & Rehabilitation 11/14/2018   Mcarthur Rossetti Angiulli, PA-C 11/14/2018

## 2018-11-14 NOTE — Plan of Care (Signed)
  Problem: Clinical Measurements: Goal: Respiratory complications will improve Outcome: Not Applicable Goal: Cardiovascular complication will be avoided Outcome: Progressing   Problem: Activity: Goal: Risk for activity intolerance will decrease Outcome: Progressing   Problem: Nutrition: Goal: Adequate nutrition will be maintained Outcome: Progressing   Problem: Elimination: Goal: Will not experience complications related to bowel motility Outcome: Progressing Goal: Will not experience complications related to urinary retention Outcome: Progressing

## 2018-11-14 NOTE — Progress Notes (Signed)
Inpatient Rehabilitation-Admissions Coordinator   Met with patient at the bedside as follow up from PM&R consult. AC discussed recommendations for rehab venue. Pt is aware that they are not a candidate for CIR at this time. AC has contacted SW regarding recommendation. CIR will sign off at this time. Please call for questions.   Jhonnie Garner, OTR/L  Rehab Admissions Coordinator  [phone] 11/14/2018 4:37 PM

## 2018-11-14 NOTE — Progress Notes (Signed)
    Subjective  - POD #2  No complaints   Physical Exam:  Wound vac changed           Assessment/Plan:  POD #2  OK to d/c home from surgical perspective with wound vac and PO abx Discussed some of the wound edges are marginal and she may need additional surgery, but this will take time to declare itself Weight bearing on heel  Wells Anjel Pardo 11/14/2018 8:32 AM --  Vitals:   11/13/18 2124 11/14/18 0430  BP: 140/67 (!) 142/72  Pulse: 71 80  Resp: 18 18  Temp: 98.8 F (37.1 C) 99.9 F (37.7 C)  SpO2: 100% 90%    Intake/Output Summary (Last 24 hours) at 11/14/2018 0832 Last data filed at 11/14/2018 0120 Gross per 24 hour  Intake 1200 ml  Output 1075 ml  Net 125 ml     Laboratory CBC    Component Value Date/Time   WBC 11.0 (H) 11/14/2018 0345   HGB 10.7 (L) 11/14/2018 0345   HCT 33.3 (L) 11/14/2018 0345   PLT 209 11/14/2018 0345    BMET    Component Value Date/Time   NA 134 (L) 11/13/2018 0206   K 4.0 11/13/2018 0206   CL 100 11/13/2018 0206   CO2 23 11/13/2018 0206   GLUCOSE 193 (H) 11/13/2018 0206   BUN 11 11/13/2018 0206   CREATININE 0.77 11/13/2018 0206   CALCIUM 8.3 (L) 11/13/2018 0206   GFRNONAA >60 11/13/2018 0206   GFRAA >60 11/13/2018 0206    COAG Lab Results  Component Value Date   INR 1.07 11/12/2018   INR 1.12 08/30/2017   INR 1.16 08/23/2017   No results found for: PTT  Antibiotics Anti-infectives (From admission, onward)   Start     Dose/Rate Route Frequency Ordered Stop   11/12/18 0800  cefTRIAXone (ROCEPHIN) 2 g in sodium chloride 0.9 % 100 mL IVPB     2 g 200 mL/hr over 30 Minutes Intravenous Every 24 hours 11/12/18 0243     11/12/18 0600  metroNIDAZOLE (FLAGYL) tablet 500 mg     500 mg Oral Every 8 hours 11/12/18 0243     11/11/18 2145  vancomycin (VANCOCIN) IVPB 1000 mg/200 mL premix     1,000 mg 200 mL/hr over 60 Minutes Intravenous  Once 11/11/18 2132 11/11/18 2333   11/11/18 2145  piperacillin-tazobactam (ZOSYN)  IVPB 3.375 g     3.375 g 100 mL/hr over 30 Minutes Intravenous  Once 11/11/18 2132 11/11/18 2333       V. Charlena Cross, M.D. [facility] [place] Office: [phone] Pager:  [phone]

## 2018-11-14 NOTE — Consult Note (Signed)
[place] for Infectious Disease    Date of Admission:  11/11/2018     Total days of antibiotics 4               Reason for Consult: Osteomyelitis   Referring Provider: Lonny Prude Primary Care Provider: Maurice Small, MD   Assessment/Plan:  Danielle Buchanan is a 63 y/o who has osteomyelitis of the 3rd toe s/p transmetatarsal amputation of toes 2-5 and vascular intervention in the setting of poorly controlled diabetes and peripheral artery disease. There was concern that margins from surgery were not completely clean. Surgical cultures were positive for Serratia marcescens. Agree with her current selection of ceftriaxone for antimicrobial therapy. Will need prolonged course of 6 weeks of therapy given concern for possible infection. She currently lives alone, however does have assistance if needed. Based on chart review appears she may considered for SNF placement or inpatient rehabilitation.   1. Continue current dose of ceftriaxone. 2. Place PICC line when able.  3. OPAT orders placed. 4. Follow up in ID office in 4 weeks.   Diagnosis: Osteomyelitis of the right 3rd toe s/p transmetarsal amputation of toes 2-5 with vascular intervention.   Culture Result: Serratia Marcescens   Allergies  Allergen Reactions  . Milk-Related Compounds     incontinence  . Doxycycline Rash    OPAT Orders Discharge antibiotics: Ceftriaxone Per pharmacy protocol  Duration: 6 weeks  End Date: 12/24/18  [facility] Care Per Protocol:  Labs weekly while on IV antibiotics: _X_ CBC with differential _X_ BMP __ CMP _X_ CRP _X_ ESR __ Vancomycin trough __ CK  _X_ Please pull PIC at completion of IV antibiotics __ Please leave PIC in place until doctor has seen patient or been notified  Fax weekly labs to [phone]  Clinic Follow Up Appt:  March 5 '@2' :30 pm with Dr. 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 amitriptyline  25 mg Oral QHS  . aspirin EC  81 mg Oral Daily  . clopidogrel  75 mg Oral Q breakfast  . insulin aspart  0-15 Units Subcutaneous TID WC  . insulin glargine  50 Units Subcutaneous Daily  . losartan  100 mg Oral Daily  . multivitamin with minerals  1 tablet Per Tube Daily  . nutrition supplement (JUVEN)  1 packet Oral BID BM  . pravastatin  40 mg Oral Daily  . sertraline  100 mg Oral Daily  . sodium chloride flush  3 mL Intravenous Q12H  . traZODone  50 mg Oral QHS     HPI: Danielle Buchanan is a 63 y.o. female with previous medical history of diabetes, hypertension, peripheral artery disease and left BKA (07/2017) and amputation of right second/third/fourth metatarsals presenting with grossly infected/necrotic 3rd right toe being sent from podiatry.  Danielle Buchanan initially seen by vascular surgery on 10/21/18 after injuring her 3rd right toe with no evidence of infection at that time. On follow up with podiatry she was sent to the ED for bone exposure.   Initial x-ray imaging performed in the podiatry office with bony destruction suggestive of osteomyelitis of the middle phalanx of  the right third toe. Vascular Surgery performed angioplasty right tibioperoneal trunk and peroneal artery as well as transmetatarsal amputation of toes 2 through 5. Cultures of the infected toe were sent to microbiology.   Danielle Buchanan has been afebrile since admission with stable WBC count around 11. Blood cultures from 2/4 are without growth to date. Surgical specimen with gram stain showing gram positive cocci and gram variable rod. Cultures found to be Serratia Marcescens.    Review of Systems: Review of Systems  Constitutional:  Negative for chills, fever and weight loss.  Respiratory: Negative for cough, shortness of breath and wheezing.   Cardiovascular: Negative for chest pain and leg swelling.  Gastrointestinal: Negative for abdominal pain, constipation, diarrhea, nausea and vomiting.  Skin: Negative for rash.     Past Medical History:  Diagnosis Date  . Allergic rhinitis   . Depression   . Diabetes ([place])   . Dyslipidemia   . HTN (hypertension)   . Insomnia   . OSA (obstructive sleep apnea)    no cpap  . Urinary incontinence     Social History   Tobacco Use  . Smoking status: Former Smoker    Packs/day: 1.00    Years: 51.00    Pack years: 51.00    Types: Cigarettes    Last attempt to quit: 08/08/2018    Years since quitting: 0.2  . Smokeless tobacco: Never Used  Substance Use Topics  . Alcohol use: No    Alcohol/week: 0.0 standard drinks  . Drug use: No    Family History  Problem Relation Age of Onset  . Heart attack Mother   . Alcohol abuse Mother   . Heart attack Father   . Alcohol abuse Father   . Heart attack Sister   . Heart attack Brother   . Hypercholesterolemia Sister   . Diabetes Neg Hx     Allergies  Allergen Reactions  . Milk-Related Compounds     incontinence  . Doxycycline Rash    OBJECTIVE: Blood pressure (!) 142/72, pulse 80, temperature 99.9 F (37.7 C), temperature source Oral, resp. rate 18, height '5\' 5"'  (1.651 m), weight 95.3 kg, SpO2 90 %.  Physical Exam Constitutional:      General: She is not in acute distress.    Appearance: She is well-developed.     Comments: Lying in bed with head of bed elevated; pleasant.   Neck:     Musculoskeletal: Neck supple.  Cardiovascular:     Rate and Rhythm: Normal rate and regular rhythm.     Heart sounds: Normal heart sounds. No murmur.  Pulmonary:     Effort: Pulmonary effort is normal.     Breath sounds: Normal breath sounds. No wheezing, rhonchi or rales.  Musculoskeletal:     Comments: Right surgical  dressing is intact with some sanguinous shadowing. Left BKA.   Lymphadenopathy:     Cervical: No cervical adenopathy.  Skin:    General: Skin is warm and dry.  Neurological:     Mental Status: She is alert.  Psychiatric:        Mood and Affect: Mood normal.    Lab Results Lab Results  Component Value Date   WBC 11.0 (H) 11/14/2018   HGB 10.7 (L) 11/14/2018   HCT 33.3 (L) 11/14/2018   MCV 87.4 11/14/2018   PLT 209 11/14/2018    Lab Results  Component Value Date   CREATININE 0.77 11/13/2018   BUN 11 11/13/2018   NA 134 (L) 11/13/2018  K 4.0 11/13/2018   CL 100 11/13/2018   CO2 23 11/13/2018    Lab Results  Component Value Date   ALT 13 11/11/2018   AST 12 (L) 11/11/2018   ALKPHOS 85 11/11/2018   BILITOT 0.6 11/11/2018     Microbiology: Recent Results (from the past 240 hour(s))  Blood Culture (routine x 2)     Status: None (Preliminary result)   Collection Time: 11/11/18  9:49 PM  Result Value Ref Range Status   Specimen Description BLOOD RIGHT ANTECUBITAL  Final   Special Requests   Final    BOTTLES DRAWN AEROBIC AND ANAEROBIC Blood Culture results may not be optimal due to an inadequate volume of blood received in culture bottles   Culture   Final    NO GROWTH 3 DAYS Performed at [facility], [place] [address] [place], [place] [address]    Report Status PENDING  Incomplete  Blood Culture (routine x 2)     Status: None (Preliminary result)   Collection Time: 11/11/18 10:06 PM  Result Value Ref Range Status   Specimen Description BLOOD RIGHT HAND  Final   Special Requests   Final    BOTTLES DRAWN AEROBIC AND ANAEROBIC Blood Culture adequate volume   Culture   Final    NO GROWTH 3 DAYS Performed at [facility], [place] [address], [place], [place] [address]    Report Status PENDING  Incomplete  MRSA PCR Screening     Status: None   Collection Time: 11/12/18  4:26 AM  Result Value Ref Range Status   MRSA by PCR NEGATIVE NEGATIVE Final    Comment:         The GeneXpert MRSA Assay (FDA approved for NASAL specimens only), is one component of a comprehensive MRSA colonization surveillance program. It is not intended to diagnose MRSA infection nor to guide or monitor treatment for MRSA infections. Performed at [facility], [place] [address], [place], [place] [address]   Aerobic/Anaerobic Culture (surgical/deep wound)     Status: None (Preliminary result)   Collection Time: 11/12/18  4:35 PM  Result Value Ref Range Status   Specimen Description FOOT  Final   Special Requests RIGHT  Final   Gram Stain   Final    RARE WBC PRESENT,BOTH PMN AND MONONUCLEAR FEW GRAM POSITIVE COCCI RARE GRAM VARIABLE ROD Performed at [facility], [place] [address], [place], [place] [address]    Culture   Final    MODERATE SERRATIA MARCESCENS NO ANAEROBES ISOLATED; CULTURE IN PROGRESS FOR 5 DAYS    Report Status PENDING  Incomplete   Organism ID, Bacteria SERRATIA MARCESCENS  Final      Susceptibility   Serratia marcescens - MIC*    CEFAZOLIN >=64 RESISTANT Resistant     CEFEPIME <=1 SENSITIVE Sensitive     CEFTAZIDIME <=1 SENSITIVE Sensitive     CEFTRIAXONE <=1 SENSITIVE Sensitive     CIPROFLOXACIN <=0.25 SENSITIVE Sensitive     GENTAMICIN <=1 SENSITIVE Sensitive     TRIMETH/SULFA <=20 SENSITIVE Sensitive     * MODERATE SERRATIA MARCESCENS     Terri Piedra, NP [place] for Infectious Disease [facility] [phone] Pager  11/14/2018  2:19 PM

## 2018-11-14 NOTE — Plan of Care (Signed)

## 2018-11-14 NOTE — Progress Notes (Signed)
Peripherally Inserted Central Catheter/Midline Placement  The IV Nurse has discussed with the patient and/or persons authorized to consent for the patient, the purpose of this procedure and the potential benefits and risks involved with this procedure.  The benefits include less needle sticks, lab draws from the catheter, and the patient may be discharged home with the catheter. Risks include, but not limited to, infection, bleeding, blood clot (thrombus formation), and puncture of an artery; nerve damage and irregular heartbeat and possibility to perform a PICC exchange if needed/ordered by physician.  Alternatives to this procedure were also discussed.  Bard Power PICC patient education guide, fact sheet on infection prevention and patient information card has been provided to patient /or left at bedside.    PICC/Midline Placement Documentation  PICC Single Lumen 11/14/18 PICC Right Basilic 39 cm 0 cm (Active)  Indication for Insertion or Continuance of Line Home intravenous therapies (PICC only) 11/14/2018  8:40 PM  Exposed Catheter (cm) 0 cm 11/14/2018  8:40 PM  Site Assessment Clean;Dry;Intact 11/14/2018  8:40 PM  Line Status Flushed;Blood return noted;Saline locked 11/14/2018  8:40 PM  Dressing Type Transparent 11/14/2018  8:40 PM  Dressing Status Clean;Dry;Intact;Antimicrobial disc in place 11/14/2018  8:40 PM  Dressing Change Due 11/21/18 11/14/2018  8:40 PM       Audrie Gallus 11/14/2018, 8:51 PM

## 2018-11-15 LAB — CBC
HCT: 34.4 % — ABNORMAL LOW (ref 36.0–46.0)
Hemoglobin: 11.3 g/dL — ABNORMAL LOW (ref 12.0–15.0)
MCH: 28.5 pg (ref 26.0–34.0)
MCHC: 32.8 g/dL (ref 30.0–36.0)
MCV: 86.6 fL (ref 80.0–100.0)
Platelets: 260 10*3/uL (ref 150–400)
RBC: 3.97 MIL/uL (ref 3.87–5.11)
RDW: 12.5 % (ref 11.5–15.5)
WBC: 9.9 10*3/uL (ref 4.0–10.5)
nRBC: 0 % (ref 0.0–0.2)

## 2018-11-15 LAB — GLUCOSE, CAPILLARY
GLUCOSE-CAPILLARY: 221 mg/dL — AB (ref 70–99)
GLUCOSE-CAPILLARY: 232 mg/dL — AB (ref 70–99)
Glucose-Capillary: 258 mg/dL — ABNORMAL HIGH (ref 70–99)
Glucose-Capillary: 171 mg/dL — ABNORMAL HIGH (ref 70–99)
Glucose-Capillary: 214 mg/dL — ABNORMAL HIGH (ref 70–99)
Glucose-Capillary: 252 mg/dL — ABNORMAL HIGH (ref 70–99)

## 2018-11-15 LAB — C-REACTIVE PROTEIN: CRP: 17.8 mg/dL — ABNORMAL HIGH (ref <1.0)

## 2018-11-15 LAB — SEDIMENTATION RATE: Sed Rate: 116 mm/h — ABNORMAL HIGH (ref 0–22)

## 2018-11-15 MED ORDER — INSULIN GLARGINE 100 UNIT/ML ~~LOC~~ SOLN
53.0000 [IU] | Freq: Every day | SUBCUTANEOUS | Status: DC
  Administered 2018-11-16 – 2018-11-18 (×3): 53 [IU] via SUBCUTANEOUS
  Filled 2018-11-15 (×3): qty 0.53

## 2018-11-15 NOTE — Consult Note (Addendum)
WOC Nurse wound consult note Reason for Consult: Place NPWT dressing today and change T/TH/Saturday Wound type: Surgical  WOC Nurses arenot working today, so bedside RN Carley Hammed states that MD will return later today to place the NPWT device.  If patient is still in house on Tuesday, WOC nurse will change NPWT dressing.  WOC nursing team will follow, and will remain available to this patient, the nursing and medical teams.   Thanks, Ladona Mow, MSN, RN, GNP, [place]  Pager# [phone]

## 2018-11-15 NOTE — Progress Notes (Signed)
Spoke to MD about pt's wound vac. Wound care nurse is not available on weekends, so MD will place wound vac this afternoon. New dressing and supplies are in pt's room.

## 2018-11-15 NOTE — Plan of Care (Signed)
  Problem: Clinical Measurements: Goal: Ability to maintain clinical measurements within normal limits will improve Outcome: Progressing Goal: Will remain free from infection Outcome: Progressing Goal: Diagnostic test results will improve Outcome: Progressing Goal: Cardiovascular complication will be avoided Outcome: Progressing   Problem: Activity: Goal: Risk for activity intolerance will decrease Outcome: Progressing   Problem: Nutrition: Goal: Adequate nutrition will be maintained Outcome: Progressing   Problem: Elimination: Goal: Will not experience complications related to bowel motility Outcome: Progressing Goal: Will not experience complications related to urinary retention Outcome: Progressing   Problem: Pain Managment: Goal: General experience of comfort will improve Outcome: Progressing   Problem: Safety: Goal: Ability to remain free from injury will improve Outcome: Progressing   Problem: Skin Integrity: Goal: Risk for impaired skin integrity will decrease Outcome: Progressing   

## 2018-11-15 NOTE — Progress Notes (Signed)
[facility] PROGRESS NOTE  MINAAL BOMMER UJW:[id number] DOB: [birth year] DOA: 11/11/2018 PCP: [name], MD  Assessment/Plan: 1. Sepsis secondary to necrotic right third toe/osteomyelitis from Serratia. Sepsis resolved.  s/p transmetatarsal amputation with wound VAC placement 2/5. Due to drainage plan to reapply wound vac ( was taken down on 2/7). Continue IV ceftriaxone per ID recs, will have follow up arranged. PICC line placed. PT recommends SNF, patient is not interested. Will likely go home with home health PT once medically stable.    2. Severe stenosis of right peroneal artery, status post balloon angioplasty on 2/6.  No residual stenosis  3. Acute on chronic anemia. Acute exacerbation during stay due to postoperative blood loss anemia, stable.  Suspect patient has chronic anemia as has had low hemoglobin of 08/2018.  Prior to admission hemoglobin was 13.3, suspect this was hemoconcentrated?Marland Kitchen  No active bleeding, continue to monitor CBC.   4. Type 2 diabetes, poorly controlled.  A1c 10.9% (11/12/2018).  On a significant amount of insulin at home (190 units total daily).  Only on lantus 50 U here. FS 258. Increase to 53 U in am and monitor. SSI moderate dose to continue as well.    5. Hypertension, stable.  Continue home losartan  6. Hyperlipidemia, stable continue home pravastatin  7. Depression, stable continue home Zoloft and Elavil   Code Status: full code Family Communication: will update son by phone Disposition Plan: continue IV antibiotics, surgery placing wound vac back on   Consultants:  Vascular Surgery , ID,   Procedures:  Transmetatarsal amputation of right foot (second through fifth digits) on 2/5 with wound VAC placement  Antibiotics:  IV ceftriaxone 2/5--  IV Flagyl 2/4-2/6  IV Zosyn 2/4 complete  IV Vanco 2/4 complete  HPI/Subjective:  LEBERTA WHOOLERY is a 63 y.o. year old female with medical history  63 y.o. year old female with medical  history significant for DM2, HTN, PAD s/p L AKA in 2018, R 4th toe amputation and who presented on 11/11/2018 with concern for grossly infected/necrotic third right toe after podiatrist outpatient visit and was found to have necrotic third right toe.  Outpatient XR documented as  bony destruction suggestive of osteomyelitis at the middle phalanx of the right third toe. No gas in soft tissues  This am restless about going home. Does not want to go to SNF. Bad experience at [facility]  Objective: Vitals:   11/14/18 1943 11/15/18 0412  BP: (!) 163/73 (!) 163/79  Pulse: 73 73  Resp:    Temp: 99.9 F (37.7 C) 98.3 F (36.8 C)  SpO2: 94% 96%    Intake/Output Summary (Last 24 hours) at 11/15/2018 1408 Last data filed at 11/15/2018 1100 Gross per 24 hour  Intake 240 ml  Output 1800 ml  Net -1560 ml   Filed Weights   11/12/18 1357  Weight: 95.3 kg    Exam:  Constitutional:normal appearing female, no acute distress Eyes: EOMI, anicteric, normal conjunctivae ENMT: Oropharynx with moist mucous membranes Cardiovascular: RRR no MRGs, with no peripheral edema Respiratory: Normal respiratory effort on room air, clear breath sounds  Abdomen: Soft,non-tender, normal bowel sounds MSK:  2nd-4 digits on right foot removed, dry dressing in place Neurologic: Grossly no focal neuro deficit. Psychiatric:Appropriate affect, and mood. Mental status AAOx3   Data Reviewed: Basic Metabolic Panel: Recent Labs  Lab 11/11/18 1837 11/13/18 0206  NA 129* 134*  K 4.7 4.0  CL 91* 100  CO2 26 23  GLUCOSE 406* 193*  BUN  16 11  CREATININE 0.89 0.77  CALCIUM 9.3 8.3*   Liver Function Tests: Recent Labs  Lab 11/11/18 1837  AST 12*  ALT 13  ALKPHOS 85  BILITOT 0.6  PROT 7.4  ALBUMIN 3.2*   No results for input(s): LIPASE, AMYLASE in the last 168 hours. No results for input(s): AMMONIA in the last 168 hours. CBC: Recent Labs  Lab 11/11/18 1837 11/13/18 0206 11/14/18 0345 11/15/18 0343   WBC 14.4* 11.4* 11.0* 9.9  NEUTROABS 11.5*  --   --   --   HGB 13.3 10.8* 10.7* 11.3*  HCT 41.2 33.3* 33.3* 34.4*  MCV 87.8 88.3 87.4 86.6  PLT 240 208 209 260   Cardiac Enzymes: No results for input(s): CKTOTAL, CKMB, CKMBINDEX, TROPONINI in the last 168 hours. BNP (last 3 results) No results for input(s): BNP in the last 8760 hours.  ProBNP (last 3 results) No results for input(s): PROBNP in the last 8760 hours.  CBG: Recent Labs  Lab 11/14/18 1952 11/15/18 0007 11/15/18 0414 11/15/18 0639 11/15/18 1122  GLUCAP 246* 232* 221* 258* 171*    Recent Results (from the past 240 hour(s))  Blood Culture (routine x 2)     Status: None (Preliminary result)   Collection Time: 11/11/18  9:49 PM  Result Value Ref Range Status   Specimen Description BLOOD RIGHT ANTECUBITAL  Final   Special Requests   Final    BOTTLES DRAWN AEROBIC AND ANAEROBIC Blood Culture results may not be optimal due to an inadequate volume of blood received in culture bottles   Culture   Final    NO GROWTH 3 DAYS Performed at [facility], [address] [place], [place] [address]    Report Status PENDING  Incomplete  Blood Culture (routine x 2)     Status: None (Preliminary result)   Collection Time: 11/11/18 10:06 PM  Result Value Ref Range Status   Specimen Description BLOOD RIGHT HAND  Final   Special Requests   Final    BOTTLES DRAWN AEROBIC AND ANAEROBIC Blood Culture adequate volume   Culture   Final    NO GROWTH 3 DAYS Performed at [facility], [address] [place], [place] [address]    Report Status PENDING  Incomplete  MRSA PCR Screening     Status: None   Collection Time: 11/12/18  4:26 AM  Result Value Ref Range Status   MRSA by PCR NEGATIVE NEGATIVE Final    Comment:        The GeneXpert MRSA Assay (FDA approved for NASAL specimens only), is one component of a comprehensive MRSA colonization surveillance program. It is not intended to diagnose MRSA infection nor to  guide or monitor treatment for MRSA infections. Performed at [facility], [address] [place], [place] [address]   Aerobic/Anaerobic Culture (surgical/deep wound)     Status: None (Preliminary result)   Collection Time: 11/12/18  4:35 PM  Result Value Ref Range Status   Specimen Description FOOT  Final   Special Requests RIGHT  Final   Gram Stain   Final    RARE WBC PRESENT,BOTH PMN AND MONONUCLEAR FEW GRAM POSITIVE COCCI RARE GRAM VARIABLE ROD Performed at [facility], [address] [place], [place] [address]    Culture   Final    MODERATE SERRATIA MARCESCENS NO ANAEROBES ISOLATED; CULTURE IN PROGRESS FOR 5 DAYS    Report Status PENDING  Incomplete   Organism ID, Bacteria SERRATIA MARCESCENS  Final      Susceptibility   Serratia marcescens - MIC*    CEFAZOLIN >=64 RESISTANT Resistant     CEFEPIME <=1 SENSITIVE Sensitive     CEFTAZIDIME <=1 SENSITIVE Sensitive     CEFTRIAXONE <=1 SENSITIVE Sensitive     CIPROFLOXACIN <=0.25 SENSITIVE Sensitive     GENTAMICIN <=1 SENSITIVE Sensitive     TRIMETH/SULFA <=20 SENSITIVE Sensitive     * MODERATE SERRATIA MARCESCENS     Studies: [place] Ekg Site Rite  Result Date: 11/14/2018 If Site Rite image not attached, placement could not be confirmed due to current cardiac rhythm.  [place] Ekg Site Rite  Result Date: 11/14/2018 If [facility] image not attached, placement could not be confirmed due to current cardiac rhythm.   Scheduled Meds: . amitriptyline  25 mg Oral QHS  . aspirin EC  81 mg Oral Daily  . clopidogrel  75 mg Oral Q breakfast  . insulin aspart  0-15 Units Subcutaneous TID WC  . insulin glargine  50 Units Subcutaneous Daily  . losartan  100 mg Oral Daily  . multivitamin with minerals  1 tablet Per Tube Daily  . nutrition supplement (JUVEN)  1 packet Oral BID BM  . pravastatin  40 mg Oral Daily  . sertraline  100 mg Oral Daily  . sodium chloride flush  3 mL Intravenous Q12H  . traZODone  50 mg Oral QHS    Continuous Infusions: . sodium chloride 250 mL (11/13/18 1730)  . cefTRIAXone (ROCEPHIN)  IV 2 g (11/15/18 1018)  . 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

## 2018-11-15 NOTE — Progress Notes (Addendum)
Physical Therapy Treatment Patient Details Name: Danielle ContesJanice M Bankson MRN: [id number] DOB: [birth year] Today's Date: 11/15/2018    History of Present Illness 63 yo female with history of injury to R 3rd toe in January, wound worsened and she now received amputation of R 2nd/3rd/5th toes on 11/12/2018. PMH DM, HTN, L AKA     PT Comments    Pt now has L prosthesis in room. Gel sleeve and sock donned in supine. Pt required min assist bed mobility. Prosthesis donned EOB. She required +2 mod assist sit to stand from elevated bed. Static stand x 3 minutes with RW and mod assist for prosthesis adjustment and to switch bed/recliner behind pt. She was unable to perform SPT or progress ambulation. Pt positioned in recliner at end of session. RN advised to use bariatric stedy for transfer back to bed. Discharge recommendation updated to SNF due to CIR declining admission.    Follow Up Recommendations  SNF     Equipment Recommendations  Other (comment)(defer to next venue)    Recommendations for Other Services       Precautions / Restrictions Precautions Precautions: Fall Restrictions Weight Bearing Restrictions: Yes RLE Weight Bearing: Weight bearing as tolerated Other Position/Activity Restrictions: WBAT through heel in darco shoe    Mobility  Bed Mobility Overal bed mobility: Needs Assistance Bed Mobility: Supine to Sit;Sit to Supine     Supine to sit: Min assist;HOB elevated Sit to supine: Min assist;HOB elevated   General bed mobility comments: +rail, assist to elevate trunk and scoot to EOB, increased time and effort  Transfers Overall transfer level: Needs assistance Equipment used: Rolling walker (2 wheeled) Transfers: Sit to/from Stand Sit to Stand: +2 physical assistance;From elevated surface;Mod assist         General transfer comment: L prosthesis donned prior to stand. Cue for full upright stance/posture. Pt unable to pivot or take steps. Seating surfaces switched behind pt  for transition to recliner.    Ambulation/Gait             General Gait Details: unable   Stairs             Wheelchair Mobility    Modified Rankin (Stroke Patients Only)       Balance Overall balance assessment: Needs assistance Sitting-balance support: No upper extremity supported;Feet unsupported Sitting balance-Leahy Scale: Fair Sitting balance - Comments: min guard assist Postural control: Posterior lean   Standing balance-Leahy Scale: Poor Standing balance comment: static stand with RW and mod assist                            Cognition Arousal/Alertness: Awake/alert Behavior During Therapy: WFL for tasks assessed/performed Overall Cognitive Status: Within Functional Limits for tasks assessed                                        Exercises      General Comments        Pertinent Vitals/Pain Pain Assessment: No/denies pain    Home Living                      Prior Function            PT Goals (current goals can now be found in the care plan section) Acute Rehab PT Goals Patient Stated Goal: go home  PT Goal Formulation: With  patient Time For Goal Achievement: 11/28/18 Potential to Achieve Goals: Good Progress towards PT goals: Progressing toward goals    Frequency    Min 3X/week      PT Plan Discharge plan needs to be updated    Co-evaluation              AM-PAC PT "6 Clicks" Mobility   Outcome Measure  Help needed turning from your back to your side while in a flat bed without using bedrails?: None Help needed moving from lying on your back to sitting on the side of a flat bed without using bedrails?: A Little Help needed moving to and from a bed to a chair (including a wheelchair)?: A Lot Help needed standing up from a chair using your arms (e.g., wheelchair or bedside chair)?: A Lot Help needed to walk in hospital room?: Total Help needed climbing 3-5 steps with a railing? :  Total 6 Click Score: 13    End of Session Equipment Utilized During Treatment: Gait belt;Other (comment)(darco shoe R foot) Activity Tolerance: Patient tolerated treatment well Patient left: in chair;with call bell/phone within reach;with chair alarm set Nurse Communication: Mobility status;Need for lift equipment PT Visit Diagnosis: Unsteadiness on feet (R26.81);Other abnormalities of gait and mobility (R26.89);Muscle weakness (generalized) (M62.81)     Time: 2080-2233 PT Time Calculation (min) (ACUTE ONLY): 20 min  Charges:  $Therapeutic Activity: 8-22 mins                     Aida Raider, PT  Office # [phone] Pager [phone]    Ilda Foil 11/15/2018, 2:01 PM

## 2018-11-15 NOTE — Progress Notes (Signed)
[facility]  Subjective  - No complaints.  States foot feels fine.   Objective (!) 163/79 73 98.3 F (36.8 C) (Oral) 18 96%  Intake/Output Summary (Last 24 hours) at 11/15/2018 0949 Last data filed at 11/15/2018 0900 Gross per 24 hour  Intake 3 ml  Output 1800 ml  Net -1797 ml    Left groin soft Right foot with hematoma and drainage on dressing  Laboratory Lab Results: Recent Labs    11/14/18 0345 11/15/18 0343  WBC 11.0* 9.9  HGB 10.7* 11.3*  HCT 33.3* 34.4*  PLT 209 260   BMET Recent Labs    11/13/18 0206  NA 134*  K 4.0  CL 100  CO2 23  GLUCOSE 193*  BUN 11  CREATININE 0.77  CALCIUM 8.3*    COAG Lab Results  Component Value Date   INR 1.07 11/12/2018   INR 1.12 08/30/2017   INR 1.16 08/23/2017   No results found for: PTT  Assessment/Planning:  63 yo F s/p R TP trunk/peroneal angioplasty and transmetarsal amp of toes 2-5.  Will place right foot back in negative pressure vac today given ongoing oozing from incision.  Will continue to monitor for healing.   Cephus ShellingChristopher J Azelea Seguin 11/15/2018 9:49 AM --

## 2018-11-16 LAB — CBC
HCT: 37.7 % (ref 36.0–46.0)
Hemoglobin: 12.1 g/dL (ref 12.0–15.0)
MCH: 27.7 pg (ref 26.0–34.0)
MCHC: 32.1 g/dL (ref 30.0–36.0)
MCV: 86.3 fL (ref 80.0–100.0)
NRBC: 0 % (ref 0.0–0.2)
Platelets: 308 10*3/uL (ref 150–400)
RBC: 4.37 MIL/uL (ref 3.87–5.11)
RDW: 12.5 % (ref 11.5–15.5)
WBC: 10.2 10*3/uL (ref 4.0–10.5)

## 2018-11-16 LAB — GLUCOSE, CAPILLARY
GLUCOSE-CAPILLARY: 237 mg/dL — AB (ref 70–99)
GLUCOSE-CAPILLARY: 262 mg/dL — AB (ref 70–99)
Glucose-Capillary: 221 mg/dL — ABNORMAL HIGH (ref 70–99)
Glucose-Capillary: 214 mg/dL — ABNORMAL HIGH (ref 70–99)
Glucose-Capillary: 211 mg/dL — ABNORMAL HIGH (ref 70–99)
Glucose-Capillary: 179 mg/dL — ABNORMAL HIGH (ref 70–99)
Glucose-Capillary: 258 mg/dL — ABNORMAL HIGH (ref 70–99)

## 2018-11-16 LAB — CULTURE, BLOOD (ROUTINE X 2)
Culture: NO GROWTH
Culture: NO GROWTH
Special Requests: ADEQUATE

## 2018-11-16 MED ORDER — LORAZEPAM 0.5 MG PO TABS
0.5000 mg | ORAL_TABLET | Freq: Two times a day (BID) | ORAL | Status: DC | PRN
  Administered 2018-11-16 – 2018-11-20 (×3): 0.5 mg via ORAL
  Filled 2018-11-16 (×3): qty 1

## 2018-11-16 NOTE — Progress Notes (Signed)
Family at bedside. Pt noted to be emotionally distraught. Attempts to console pt are not effective. MD notified. Family would like to be involved in conversation regarding possible surgery to amputate further up her leg.  Continue to monitor.

## 2018-11-16 NOTE — Progress Notes (Addendum)
  Progress Note    11/16/2018 9:19 AM 4 Days Post-Op  Subjective:  No pain R foot.  WOC RN was not working yesterday thus wound vac was not re-applied.   Vitals:   11/15/18 2021 11/16/18 0430  BP: (!) 159/71 126/72  Pulse: 69 68  Resp:  16  Temp: 98.5 F (36.9 C) 98.5 F (36.9 C)  SpO2: 96% 98%   Physical Exam: Lungs:  Non labored Incisions:  R partial foot amp site with now necrotic flap, minimal sanguinous drainage from medial aspect of incision Neurologic: A&O  CBC    Component Value Date/Time   WBC 10.2 11/16/2018 0324   RBC 4.37 11/16/2018 0324   HGB 12.1 11/16/2018 0324   HCT 37.7 11/16/2018 0324   PLT 308 11/16/2018 0324   MCV 86.3 11/16/2018 0324   MCH 27.7 11/16/2018 0324   MCHC 32.1 11/16/2018 0324   RDW 12.5 11/16/2018 0324   LYMPHSABS 1.5 11/11/2018 1837   MONOABS 0.8 11/11/2018 1837   EOSABS 0.2 11/11/2018 1837   BASOSABS 0.1 11/11/2018 1837    BMET    Component Value Date/Time   NA 134 (L) 11/13/2018 0206   K 4.0 11/13/2018 0206   CL 100 11/13/2018 0206   CO2 23 11/13/2018 0206   GLUCOSE 193 (H) 11/13/2018 0206   BUN 11 11/13/2018 0206   CREATININE 0.77 11/13/2018 0206   CALCIUM 8.3 (L) 11/13/2018 0206   GFRNONAA >60 11/13/2018 0206   GFRAA >60 11/13/2018 0206    INR    Component Value Date/Time   INR 1.07 11/12/2018 0057     Intake/Output Summary (Last 24 hours) at 11/16/2018 0919 Last data filed at 11/16/2018 0700 Gross per 24 hour  Intake 820 ml  Output 1600 ml  Net -780 ml     Assessment/Plan:  63 y.o. female is s/p R TP trunk/peroneal angioplasty and transmetarsal amp of toes 2-5 4 Days Post-Op   Flap of partial foot amp now necrotic Wound vac discontinued; dry dressing applied Patient is aware she will likely require more proximal amputation No indication for urgent intervention at this time Dr. Myra Gianotti will re-assess R foot tomorrow morning   Emilie Rutter, PA-C [facility] [phone] 11/16/2018 9:19 AM   I have seen and evaluated the patient. I agree with the PA note as documented above. Concern for necrotic wound edges after TMA of toes 2-5.  Will not put back in vac.  Concern she needs more proximal amputation.  Dr. Myra Gianotti will re-evaluate tomorrow.  Cephus Shelling, MD [facility] Office: [phone] Pager: [phone]

## 2018-11-16 NOTE — Consult Note (Signed)
WOC Nurse wound consult note Notes by Dr. Chestine Spore are appreciated.  NPWT is not reinstated.  Patient may require more proximal amputation and is pending assessment by Dr. Myra Gianotti on Monday.  WOC nursing team will not follow, but will remain available to this patient, the nursing, surgical and medical teams. Please reconsult if needed. Thanks, Ladona Mow, MSN, RN, GNP, [place]  Pager# [phone]

## 2018-11-16 NOTE — Progress Notes (Signed)
[facility] PROGRESS NOTE  Danielle ContesJanice M Boldman ZOX:[id number] DOB: 1955/12/27 DOA: 11/11/2018 PCP: Shirlean MylarWebb, Carol, MD  Assessment/Plan: 1. Sepsis secondary to necrotic right third toe/osteomyelitis from Serratia. Sepsis resolved.  s/p transmetatarsal amputation with wound VAC placement 2/5. Surgery concerned she may need more proximal amputation given some necrosis on flap, Dr. Myra GianottiBrabham will re-evaluation on 2/10. Continue IV ceftriaxone per ID recs, will have follow up arranged. PICC line placed. PT recommends SNF, patient is not interested. Will likely go home with home health PT once medically stable.    2. Severe stenosis of right peroneal artery, status post balloon angioplasty on 2/6.  No residual stenosis  3. Acute on chronic anemia. Acute exacerbation during stay due to postoperative blood loss anemia, stable.  Suspect patient has chronic anemia as has had low hemoglobin of 08/2018.  Prior to admission hemoglobin was 13.3, suspect this was hemoconcentrated?Marland Kitchen.  No active bleeding, continue to monitor CBC.   4. Type 2 diabetes, poorly controlled.  A1c 10.9% (11/12/2018).  On a significant amount of insulin at home (190 units total daily).  Only on lantus 50 U here. FS 258. Increase to 53 U in am and monitor. SSI moderate dose to continue as well.    5. Hypertension, stable.  Continue home losartan  6. Hyperlipidemia, stable continue home pravastatin  7. Depression, stable continue home Zoloft and Elavil   Code Status: full code Family Communication: will update son by phone Disposition Plan: continue IV antibiotics, surgery will reevaluate potential need for amputation  Consultants:  Vascular Surgery , ID,   Procedures:  Transmetatarsal amputation of right foot (second through fifth digits) on 2/5 with wound VAC placement  Antibiotics:  IV ceftriaxone 2/5--  IV Flagyl 2/4-2/6  IV Zosyn 2/4 complete  IV Vanco 2/4 complete  HPI/Subjective:  Danielle Buchanan is a 63 y.o. year  old female with medical history  63 y.o. year old female with medical history significant for DM2, HTN, PAD s/p L AKA in 2018, R 4th toe amputation and who presented on 11/11/2018 with concern for grossly infected/necrotic third right toe after podiatrist outpatient visit and was found to have necrotic third right toe.  Outpatient XR documented as  bony destruction suggestive of osteomyelitis at the middle phalanx of the right third toe. No gas in soft tissues  This am very anxious given potential for possible more amputation. Denies any foot pain, fevers or chills.   Objective: Vitals:   11/16/18 0430 11/16/18 1331  BP: 126/72 139/72  Pulse: 68 70  Resp: 16 18  Temp: 98.5 F (36.9 C) 97.9 F (36.6 C)  SpO2: 98% 94%    Intake/Output Summary (Last 24 hours) at 11/16/2018 1546 Last data filed at 11/16/2018 1430 Gross per 24 hour  Intake 1300 ml  Output 1350 ml  Net -50 ml   Filed Weights   11/12/18 1357  Weight: 95.3 kg    Exam:  Constitutional:normal appearing female, no acute distress Eyes: EOMI, anicteric, normal conjunctivae ENMT: Oropharynx with moist mucous membranes Cardiovascular: RRR no MRGs, with no peripheral edema Respiratory: Normal respiratory effort on room air, clear breath sounds  Abdomen: Soft,non-tender, normal bowel sounds MSK:  2nd-4 digits on right foot removed, dry dressing in place Neurologic: Grossly no focal neuro deficit. Psychiatric:Appropriate affect, and mood. Mental status AAOx3   Data Reviewed: Basic Metabolic Panel: Recent Labs  Lab 11/11/18 1837 11/13/18 0206  NA 129* 134*  K 4.7 4.0  CL 91* 100  CO2 26 23  GLUCOSE  406* 193*  BUN 16 11  CREATININE 0.89 0.77  CALCIUM 9.3 8.3*   Liver Function Tests: Recent Labs  Lab 11/11/18 1837  AST 12*  ALT 13  ALKPHOS 85  BILITOT 0.6  PROT 7.4  ALBUMIN 3.2*   No results for input(s): LIPASE, AMYLASE in the last 168 hours. No results for input(s): AMMONIA in the last 168  hours. CBC: Recent Labs  Lab 11/11/18 1837 11/13/18 0206 11/14/18 0345 11/15/18 0343 11/16/18 0324  WBC 14.4* 11.4* 11.0* 9.9 10.2  NEUTROABS 11.5*  --   --   --   --   HGB 13.3 10.8* 10.7* 11.3* 12.1  HCT 41.2 33.3* 33.3* 34.4* 37.7  MCV 87.8 88.3 87.4 86.6 86.3  PLT 240 208 209 260 308   Cardiac Enzymes: No results for input(s): CKTOTAL, CKMB, CKMBINDEX, TROPONINI in the last 168 hours. BNP (last 3 results) No results for input(s): BNP in the last 8760 hours.  ProBNP (last 3 results) No results for input(s): PROBNP in the last 8760 hours.  CBG: Recent Labs  Lab 11/16/18 0018 11/16/18 0424 11/16/18 0644 11/16/18 0807 11/16/18 1113  GLUCAP 262* 221* 214* 211* 237*    Recent Results (from the past 240 hour(s))  Blood Culture (routine x 2)     Status: None (Preliminary result)   Collection Time: 11/11/18  9:49 PM  Result Value Ref Range Status   Specimen Description BLOOD RIGHT ANTECUBITAL  Final   Special Requests   Final    BOTTLES DRAWN AEROBIC AND ANAEROBIC Blood Culture results may not be optimal due to an inadequate volume of blood received in culture bottles   Culture   Final    NO GROWTH 4 DAYS Performed at [facility], [address] [place], [place] [address]    Report Status PENDING  Incomplete  Blood Culture (routine x 2)     Status: None (Preliminary result)   Collection Time: 11/11/18 10:06 PM  Result Value Ref Range Status   Specimen Description BLOOD RIGHT HAND  Final   Special Requests   Final    BOTTLES DRAWN AEROBIC AND ANAEROBIC Blood Culture adequate volume   Culture   Final    NO GROWTH 4 DAYS Performed at [facility], [address] [place], [place] [address]    Report Status PENDING  Incomplete  MRSA PCR Screening     Status: None   Collection Time: 11/12/18  4:26 AM  Result Value Ref Range Status   MRSA by PCR NEGATIVE NEGATIVE Final    Comment:        The GeneXpert MRSA Assay (FDA approved for NASAL  specimens only), is one component of a comprehensive MRSA colonization surveillance program. It is not intended to diagnose MRSA infection nor to guide or monitor treatment for MRSA infections. Performed at [facility], [address] [place], [place] [address]   Aerobic/Anaerobic Culture (surgical/deep wound)     Status: None (Preliminary result)   Collection Time: 11/12/18  4:35 PM  Result Value Ref Range Status   Specimen Description FOOT  Final   Special Requests RIGHT  Final   Gram Stain   Final    RARE WBC PRESENT,BOTH PMN AND MONONUCLEAR FEW GRAM POSITIVE COCCI RARE GRAM VARIABLE ROD Performed at [facility], [address] [place], [place] [address]    Culture   Final    MODERATE SERRATIA MARCESCENS NO ANAEROBES ISOLATED; CULTURE IN PROGRESS FOR 5 DAYS  Report Status PENDING  Incomplete   Organism ID, Bacteria SERRATIA MARCESCENS  Final      Susceptibility   Serratia marcescens - MIC*    CEFAZOLIN >=64 RESISTANT Resistant     CEFEPIME <=1 SENSITIVE Sensitive     CEFTAZIDIME <=1 SENSITIVE Sensitive     CEFTRIAXONE <=1 SENSITIVE Sensitive     CIPROFLOXACIN <=0.25 SENSITIVE Sensitive     GENTAMICIN <=1 SENSITIVE Sensitive     TRIMETH/SULFA <=20 SENSITIVE Sensitive     * MODERATE SERRATIA MARCESCENS     Studies: [place] Ekg Site Rite  Result Date: 11/14/2018 If Site Rite image not attached, placement could not be confirmed due to current cardiac rhythm.   Scheduled Meds: . amitriptyline  25 mg Oral QHS  . aspirin EC  81 mg Oral Daily  . clopidogrel  75 mg Oral Q breakfast  . insulin aspart  0-15 Units Subcutaneous TID WC  . insulin glargine  53 Units Subcutaneous Daily  . losartan  100 mg Oral Daily  . multivitamin with minerals  1 tablet Per Tube Daily  . nutrition supplement (JUVEN)  1 packet Oral BID BM  . pravastatin  40 mg Oral Daily  . sertraline  100 mg Oral Daily  . sodium chloride flush  3 mL Intravenous Q12H  . traZODone  50 mg Oral  QHS   Continuous Infusions: . cefTRIAXone (ROCEPHIN)  IV 2 g (11/16/18 0831)  . 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

## 2018-11-16 NOTE — Plan of Care (Signed)
  Problem: Pain Managment: Goal: General experience of comfort will improve Outcome: Progressing   Problem: Safety: Goal: Ability to remain free from injury will improve Outcome: Progressing   Problem: Skin Integrity: Goal: Risk for impaired skin integrity will decrease Outcome: Progressing   

## 2018-11-17 ENCOUNTER — Encounter (HOSPITAL_COMMUNITY): Payer: Self-pay | Admitting: *Deleted

## 2018-11-17 ENCOUNTER — Telehealth: Payer: Self-pay | Admitting: Podiatry

## 2018-11-17 LAB — BASIC METABOLIC PANEL
Anion gap: 9 (ref 5–15)
BUN: 9 mg/dL (ref 8–23)
CO2: 25 mmol/L (ref 22–32)
Calcium: 8.7 mg/dL — ABNORMAL LOW (ref 8.9–10.3)
Chloride: 102 mmol/L (ref 98–111)
Creatinine, Ser: 0.73 mg/dL (ref 0.44–1.00)
GFR calc Af Amer: >60 mL/min (ref >60)
GFR calc non Af Amer: >60 mL/min (ref >60)
Glucose, Bld: 263 mg/dL — ABNORMAL HIGH (ref 70–99)
Potassium: 3.8 mmol/L (ref 3.5–5.1)
Sodium: 136 mmol/L (ref 135–145)

## 2018-11-17 LAB — GLUCOSE, CAPILLARY
Glucose-Capillary: 188 mg/dL — ABNORMAL HIGH (ref 70–99)
Glucose-Capillary: 225 mg/dL — ABNORMAL HIGH (ref 70–99)
Glucose-Capillary: 279 mg/dL — ABNORMAL HIGH (ref 70–99)
Glucose-Capillary: 235 mg/dL — ABNORMAL HIGH (ref 70–99)

## 2018-11-17 LAB — CBC
HCT: 36.4 % (ref 36.0–46.0)
Hemoglobin: 11.5 g/dL — ABNORMAL LOW (ref 12.0–15.0)
MCH: 27.5 pg (ref 26.0–34.0)
MCHC: 31.6 g/dL (ref 30.0–36.0)
MCV: 87.1 fL (ref 80.0–100.0)
Platelets: 307 10*3/uL (ref 150–400)
RBC: 4.18 MIL/uL (ref 3.87–5.11)
RDW: 12.7 % (ref 11.5–15.5)
WBC: 9.8 10*3/uL (ref 4.0–10.5)
nRBC: 0 % (ref 0.0–0.2)

## 2018-11-17 LAB — HCV COMMENT:

## 2018-11-17 LAB — HEPATITIS C ANTIBODY (REFLEX): HCV Ab: <0.1 s/co ratio (ref 0.0–0.9)

## 2018-11-17 MED ORDER — INSULIN ASPART 100 UNIT/ML ~~LOC~~ SOLN
3.0000 [IU] | Freq: Three times a day (TID) | SUBCUTANEOUS | Status: DC
  Administered 2018-11-17 – 2018-11-22 (×14): 3 [IU] via SUBCUTANEOUS

## 2018-11-17 NOTE — Progress Notes (Signed)
Inpatient Diabetes Program Recommendations  AACE/ADA: New Consensus Statement on Inpatient Glycemic Control (2015)  Target Ranges:  Prepandial:   less than 140 mg/dL      Peak postprandial:   less than 180 mg/dL (1-2 hours)      Critically ill patients:  140 - 180 mg/dL   Lab Results  Component Value Date   GLUCAP 188 (H) 11/17/2018   HGBA1C 10.9 (H) 11/12/2018    Review of Glycemic Control Results for Danielle Buchanan, Danielle Buchanan (MRN [id number]) as of 11/17/2018 11:26  Ref. Range 11/16/2018 08:07 11/16/2018 11:13 11/16/2018 16:26 11/16/2018 19:15 11/17/2018 07:38  Glucose-Capillary Latest Ref Range: 70 - 99 mg/dL 409211 (H) 811237 (H) 914179 (H) 258 (H) 188 (H)   Diabetes history: DM2 Outpatient Diabetes medications: Novolin 70/30 insulin mix 60 ac breakfast,100 ac supper Current orders for Inpatient glycemic control: Lantus 53 units + Novolog moderate correction tid  Inpatient Diabetes Program Recommendations:   Noted postprandial CBGs elevated. -Add Novolog 3 units tid  Thank you, Danielle FischerJudy E. Ardel Jagger, RN, MSN, CDE  Diabetes Coordinator Inpatient Glycemic Control Team Team Pager [phone] (8am-5pm) 11/17/2018 11:29 AM

## 2018-11-17 NOTE — Progress Notes (Addendum)
[facility] PROGRESS NOTE  Danielle Buchanan BMW:[id number] DOB: October 05, 1956 DOA: 11/11/2018 PCP: [name], MD  Assessment/Plan: 1. Sepsis secondary to necrotic right third toe/osteomyelitis from Serratia. Sepsis resolved.  s/p transmetatarsal amputation with wound VAC placement 2/5. Surgery recommends total transmetatarsal amputation given poor healing, Dr. Elpidio Anis, n.p.o. midnight on 2/12 . Continue IV ceftriaxone per ID recs, will have follow up arranged. PICC line placed. PT recommends SNF, patient is not interested. Will likely go home with home health PT once medically stable.    2. Severe stenosis of right peroneal artery, status post balloon angioplasty on 2/6.  No residual stenosis.  Continue home aspirin, Plavix added after procedure  3. Acute on chronic anemia, stable. Acute exacerbation during stay due to postoperative blood loss anemia.  Suspect patient has chronic anemia as has had low hemoglobin of 08/2018.  Prior to admission hemoglobin was 13.3, suspect this was hemoconcentrated?Marland Kitchen  No active bleeding, continue to monitor CBC.   4. Type 2 diabetes, poorly controlled.  A1c 10.9% (11/12/2018).  On a significant amount of insulin at home (190 units total daily).  Only on lantus 53 U here.  Given significant postprandial hyperglycemia will add NovoLog 3 units 3 times daily. SSI moderate dose to continue as well.    5. Hypertension, stable.  Continue home losartan  6. Hyperlipidemia, stable continue home pravastatin  7. Depression, stable continue home Zoloft and Elavil   Code Status: full code Family Communication: will update son by phone Disposition Plan: continue IV antibiotics, surgery will reevaluate potential need for amputation  Consultants:  Vascular Surgery , ID,   Procedures:  Transmetatarsal amputation of right foot (second through fifth digits) on 2/5 with wound VAC placement  Antibiotics:  IV ceftriaxone 2/5--  IV Flagyl 2/4-2/6  IV Zosyn 2/4  complete  IV Vanco 2/4 complete  HPI/Subjective:  Danielle Buchanan is a 63 y.o. year old female with medical history  63 y.o. year old female with medical history significant for DM2, HTN, PAD s/p L AKA in 2018, R 4th toe amputation and who presented on 11/11/2018 with concern for grossly infected/necrotic third right toe after podiatrist outpatient visit and was found to have necrotic third right toe.  Outpatient XR documented as  bony destruction suggestive of osteomyelitis at the middle phalanx of the right third toe. No gas in soft tissues  This am flat affect.  Not as anxious as yesterday.  Denies any pain in foot.  Objective: Vitals:   11/17/18 0856 11/17/18 1114  BP: 140/75 (!) 156/69  Pulse: 82 74  Resp: 17 16  Temp: 98.3 F (36.8 C) (!) 97.2 F (36.2 C)  SpO2: 98% 93%    Intake/Output Summary (Last 24 hours) at 11/17/2018 1714 Last data filed at 11/17/2018 1300 Gross per 24 hour  Intake 480 ml  Output 1600 ml  Net -1120 ml   Filed Weights   11/12/18 1357  Weight: 95.3 kg    Exam:  Constitutional:normal appearing female, no acute distress Eyes: EOMI, anicteric, normal conjunctivae ENMT: Oropharynx with moist mucous membranes Cardiovascular: RRR no MRGs, with no peripheral edema Respiratory: Normal respiratory effort on room air, clear breath sounds  Abdomen: Soft,non-tender, normal bowel sounds MSK:  2nd-4 digits on right foot removed, dry dressing in place, left AKA Neurologic: Grossly no focal neuro deficit. Psychiatric: Flat affect, and normal mood. Mental status AAOx3   Data Reviewed: Basic Metabolic Panel: Recent Labs  Lab 11/11/18 1837 11/13/18 0206 11/17/18 0928  NA 129* 134* 136  K 4.7 4.0 3.8  CL 91* 100 102  CO2 26 23 25   GLUCOSE 406* 193* 263*  BUN 16 11 9   CREATININE 0.89 0.77 0.73  CALCIUM 9.3 8.3* 8.7*   Liver Function Tests: Recent Labs  Lab 11/11/18 1837  AST 12*  ALT 13  ALKPHOS 85  BILITOT 0.6  PROT 7.4  ALBUMIN 3.2*   No  results for input(s): LIPASE, AMYLASE in the last 168 hours. No results for input(s): AMMONIA in the last 168 hours. CBC: Recent Labs  Lab 11/11/18 1837 11/13/18 0206 11/14/18 0345 11/15/18 0343 11/16/18 0324 11/17/18 0928  WBC 14.4* 11.4* 11.0* 9.9 10.2 9.8  NEUTROABS 11.5*  --   --   --   --   --   HGB 13.3 10.8* 10.7* 11.3* 12.1 11.5*  HCT 41.2 33.3* 33.3* 34.4* 37.7 36.4  MCV 87.8 88.3 87.4 86.6 86.3 87.1  PLT 240 208 209 260 308 307   Cardiac Enzymes: No results for input(s): CKTOTAL, CKMB, CKMBINDEX, TROPONINI in the last 168 hours. BNP (last 3 results) No results for input(s): BNP in the last 8760 hours.  ProBNP (last 3 results) No results for input(s): PROBNP in the last 8760 hours.  CBG: Recent Labs  Lab 11/16/18 1626 11/16/18 1915 11/17/18 0738 11/17/18 1117 11/17/18 1627  GLUCAP 179* 258* 188* 225* 279*    Recent Results (from the past 240 hour(s))  Blood Culture (routine x 2)     Status: None   Collection Time: 11/11/18  9:49 PM  Result Value Ref Range Status   Specimen Description BLOOD RIGHT ANTECUBITAL  Final   Special Requests   Final    BOTTLES DRAWN AEROBIC AND ANAEROBIC Blood Culture results may not be optimal due to an inadequate volume of blood received in culture bottles   Culture   Final    NO GROWTH 5 DAYS Performed at [facility], [address] [place], [place] [address]    Report Status 11/16/2018 FINAL  Final  Blood Culture (routine x 2)     Status: None   Collection Time: 11/11/18 10:06 PM  Result Value Ref Range Status   Specimen Description BLOOD RIGHT HAND  Final   Special Requests   Final    BOTTLES DRAWN AEROBIC AND ANAEROBIC Blood Culture adequate volume   Culture   Final    NO GROWTH 5 DAYS Performed at [facility], [address] [place], [place] [address]    Report Status 11/16/2018 FINAL  Final  MRSA PCR Screening     Status: None   Collection Time: 11/12/18  4:26 AM  Result Value Ref Range Status    MRSA by PCR NEGATIVE NEGATIVE Final    Comment:        The GeneXpert MRSA Assay (FDA approved for NASAL specimens only), is one component of a comprehensive MRSA colonization surveillance program. It is not intended to diagnose MRSA infection nor to guide or monitor treatment for MRSA infections. Performed at [facility], [address] [place], [place] [address]   Aerobic/Anaerobic Culture (surgical/deep wound)     Status: None (Preliminary result)   Collection Time: 11/12/18  4:35 PM  Result Value Ref Range Status   Specimen Description FOOT  Final   Special Requests RIGHT  Final   Gram Stain   Final    RARE WBC PRESENT,BOTH PMN AND MONONUCLEAR FEW GRAM POSITIVE COCCI RARE GRAM VARIABLE ROD Performed at [facility], [address] [place], [place] [address]    Culture   Final    MODERATE SERRATIA MARCESCENS NO ANAEROBES ISOLATED; CULTURE IN PROGRESS FOR 5 DAYS    Report Status PENDING  Incomplete   Organism ID, Bacteria SERRATIA MARCESCENS  Final      Susceptibility   Serratia marcescens - MIC*    CEFAZOLIN >=64 RESISTANT Resistant     CEFEPIME <=1 SENSITIVE Sensitive     CEFTAZIDIME <=1 SENSITIVE Sensitive     CEFTRIAXONE <=1 SENSITIVE Sensitive     CIPROFLOXACIN <=0.25 SENSITIVE Sensitive     GENTAMICIN <=1 SENSITIVE Sensitive     TRIMETH/SULFA <=20 SENSITIVE Sensitive     * MODERATE SERRATIA MARCESCENS     Studies: No results found.  Scheduled Meds: . amitriptyline  25 mg Oral QHS  . aspirin EC  81 mg Oral Daily  . clopidogrel  75 mg Oral Q breakfast  . insulin aspart  0-15 Units Subcutaneous TID WC  . insulin glargine  53 Units Subcutaneous Daily  . losartan  100 mg Oral Daily  . multivitamin with minerals  1 tablet Per Tube Daily  . nutrition supplement (JUVEN)  1 packet Oral BID BM  . pravastatin  40 mg Oral Daily  . sertraline  100 mg Oral Daily  . sodium chloride flush  3 mL Intravenous Q12H  . traZODone  50 mg Oral QHS   Continuous  Infusions: . cefTRIAXone (ROCEPHIN)  IV 2 g (11/17/18 0909)  . 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

## 2018-11-17 NOTE — Progress Notes (Signed)
Physical Therapy Treatment Patient Details Name: Danielle Buchanan MRN: [id number] DOB: 05-Sep-1956 Today's Date: 11/17/2018    History of Present Illness 63 yo female with history of injury to R 3rd toe in January, wound worsened and she now received amputation of R 2nd/3rd/5th toes on 11/12/2018. PMH DM, HTN, L AKA     PT Comments    Patient sleeping upon entering room, easily aroused, agreeable to PT session.  Patient requires min assist with supine to sit and to maintain sitting balance. Patient falling to right while trying to manage prosthetic on left LE. Patient is able to perform sit to stand with bed elevated and min assist. Cues for posture and balance. Patient able to ambulate 3 feet forward and 3 feet back to bed. Distance limited by fatigue. Patient unsteady with backward steps requiring mod assist to return to sitting on edge of bed as she lost balance.  Patient will benefit from continued skilled PT to improve strength, transfers and ambulation to return to prior functional level.       Follow Up Recommendations  SNF     Equipment Recommendations       Recommendations for Other Services       Precautions / Restrictions Precautions Precautions: Fall Restrictions Weight Bearing Restrictions: Yes RLE Weight Bearing: Weight bearing as tolerated Other Position/Activity Restrictions: WBAT through heel in darco shoe    Mobility  Bed Mobility Overal bed mobility: Needs Assistance Bed Mobility: Supine to Sit     Supine to sit: Min assist;HOB elevated Sit to supine: Min guard   General bed mobility comments: +rail, assist to elevate trunk and scoot to EOB, increased time and effort  Transfers Overall transfer level: Needs assistance Equipment used: Rolling walker (2 wheeled) Transfers: Sit to/from Stand Sit to Stand: From elevated surface;Min assist         General transfer comment: cues for upright posture  Ambulation/Gait Ambulation/Gait assistance: Min  assist Gait Distance (Feet): 6 Feet Assistive device: Rolling walker (2 wheeled) Gait Pattern/deviations: Step-to pattern;Decreased step length - right;Decreased step length - left;Trunk flexed Gait velocity: decreased       Stairs             Wheelchair Mobility    Modified Rankin (Stroke Patients Only)       Balance Overall balance assessment: Needs assistance Sitting-balance support: Single extremity supported Sitting balance-Leahy Scale: Fair Sitting balance - Comments: requires assist to maintain sitting balance as she tends to fall to her right Postural control: Right lateral lean Standing balance support: Bilateral upper extremity supported Standing balance-Leahy Scale: Fair Standing balance comment: min assist for static standing                            Cognition Arousal/Alertness: Awake/alert Behavior During Therapy: WFL for tasks assessed/performed Overall Cognitive Status: Within Functional Limits for tasks assessed                                        Exercises Other Exercises Other Exercises: right LE: slr, hip abd/add, heel slides x 10 reps each    General Comments        Pertinent Vitals/Pain Pain Assessment: No/denies pain    Home Living Family/patient expects to be discharged to:: Private residence Living Arrangements: Alone Available Help at Discharge: Family Type of Home: Apartment Home Access: Level entry  Home Layout: One level Home Equipment: Walker - 4 wheels;Shower seat;Wheelchair - manual      Prior Function Level of Independence: Needs assistance  Gait / Transfers Assistance Needed: reports she was Mod(I)  ADL's / Homemaking Assistance Needed: reports she was Mod(I)     PT Goals (current goals can now be found in the care plan section) Acute Rehab PT Goals Patient Stated Goal: go home  PT Goal Formulation: With patient Time For Goal Achievement: 11/28/18 Potential to Achieve Goals:  Good    Frequency    Min 3X/week      PT Plan      Co-evaluation              AM-PAC PT "6 Clicks" Mobility   Outcome Measure  Help needed turning from your back to your side while in a flat bed without using bedrails?: None Help needed moving from lying on your back to sitting on the side of a flat bed without using bedrails?: A Little Help needed moving to and from a bed to a chair (including a wheelchair)?: A Lot Help needed standing up from a chair using your arms (e.g., wheelchair or bedside chair)?: A Lot Help needed to walk in hospital room?: A Lot Help needed climbing 3-5 steps with a railing? : Total 6 Click Score: 14    End of Session Equipment Utilized During Treatment: Gait belt;Other (comment)(darco shoe on right foot) Activity Tolerance: Patient tolerated treatment well;Patient limited by fatigue Patient left: in bed;with call bell/phone within reach;with bed alarm set Nurse Communication: Mobility status PT Visit Diagnosis: Unsteadiness on feet (R26.81);Muscle weakness (generalized) (M62.81);Difficulty in walking, not elsewhere classified (R26.2)     Time: 7793-9030 PT Time Calculation (min) (ACUTE ONLY): 24 min  Charges:  $Gait Training: 8-22 mins $Therapeutic Activity: 8-22 mins                     [employer], PT, GCS 11/17/18,3:02 PM

## 2018-11-17 NOTE — Telephone Encounter (Signed)
lvm for pt to call regarding diabetic shoe paperwork. Her paperwork has expired since last visit with Dr [name] was [phone].IT has to be within 6 months of that appt when delivered.I need to know if she has seen Dr Hyman Hopes since July of last yr so I can resubmit paperwork.

## 2018-11-17 NOTE — Progress Notes (Signed)
Right foot dressing changed.  Stump clearly not viable.  She will need a more proximal amputation.  I discussed with the patient and POA either a higher TMA vs. BKA.  She wants to go with the TMA, understanding that it may not hea and she would require a BKA.  I will plan for Wednesday.   [name]

## 2018-11-18 ENCOUNTER — Encounter: Payer: Medicare HMO | Admitting: Physical Therapy

## 2018-11-18 LAB — AEROBIC/ANAEROBIC CULTURE W GRAM STAIN (SURGICAL/DEEP WOUND)

## 2018-11-18 LAB — CBC
HEMATOCRIT: 35.3 % — AB (ref 36.0–46.0)
Hemoglobin: 11.4 g/dL — ABNORMAL LOW (ref 12.0–15.0)
MCH: 28.4 pg (ref 26.0–34.0)
MCHC: 32.3 g/dL (ref 30.0–36.0)
MCV: 88 fL (ref 80.0–100.0)
NRBC: 0 % (ref 0.0–0.2)
Platelets: 328 10*3/uL (ref 150–400)
RBC: 4.01 MIL/uL (ref 3.87–5.11)
RDW: 13 % (ref 11.5–15.5)
WBC: 9.8 10*3/uL (ref 4.0–10.5)

## 2018-11-18 LAB — BASIC METABOLIC PANEL
Anion gap: 13 (ref 5–15)
BUN: 8 mg/dL (ref 8–23)
CO2: 24 mmol/L (ref 22–32)
Calcium: 8.9 mg/dL (ref 8.9–10.3)
Chloride: 100 mmol/L (ref 98–111)
Creatinine, Ser: 0.68 mg/dL (ref 0.44–1.00)
GFR calc Af Amer: >60 mL/min (ref >60)
GFR calc non Af Amer: >60 mL/min (ref >60)
Glucose, Bld: 210 mg/dL — ABNORMAL HIGH (ref 70–99)
Potassium: 3.5 mmol/L (ref 3.5–5.1)
Sodium: 137 mmol/L (ref 135–145)

## 2018-11-18 LAB — GLUCOSE, CAPILLARY
Glucose-Capillary: 215 mg/dL — ABNORMAL HIGH (ref 70–99)
Glucose-Capillary: 152 mg/dL — ABNORMAL HIGH (ref 70–99)
Glucose-Capillary: 214 mg/dL — ABNORMAL HIGH (ref 70–99)
Glucose-Capillary: 207 mg/dL — ABNORMAL HIGH (ref 70–99)

## 2018-11-18 MED ORDER — SODIUM CHLORIDE 0.9 % IV SOLN
1.5000 g | INTRAVENOUS | Status: AC
  Administered 2018-11-19: 1.5 g via INTRAVENOUS
  Filled 2018-11-18: qty 1.5

## 2018-11-18 NOTE — Progress Notes (Signed)
Plan for revision of TMA tomorrow vs BKA Lengthy discussion with patient and POA  Danielle Buchanan 

## 2018-11-18 NOTE — Anesthesia Preprocedure Evaluation (Signed)
Anesthesia Evaluation  Patient identified by MRN, date of birth, ID band Patient awake    Reviewed: Allergy & Precautions, H&P , NPO status , Patient's Chart, lab work & pertinent test results  Airway Mallampati: II  TM Distance: >3 FB Neck ROM: Full    Dental  (+) Teeth Intact, Dental Advisory Given   Pulmonary sleep apnea , Current Smoker, former smoker,    Pulmonary exam normal breath sounds clear to auscultation       Cardiovascular hypertension, Pt. on medications + Peripheral Vascular Disease   Rhythm:Regular Rate:Normal     Neuro/Psych PSYCHIATRIC DISORDERS Depression negative neurological ROS     GI/Hepatic negative GI ROS, Neg liver ROS,   Endo/Other  diabetes, Insulin Dependent  Renal/GU negative Renal ROS     Musculoskeletal   Abdominal (+) + obese,   Peds  Hematology  (+) Blood dyscrasia, anemia ,   Anesthesia Other Findings   Reproductive/Obstetrics negative OB ROS                             Anesthesia Physical  Anesthesia Plan  ASA: III  Anesthesia Plan: General   Post-op Pain Management:    Induction: Intravenous  PONV Risk Score and Plan: 3 and Ondansetron, Dexamethasone and Midazolam  Airway Management Planned: LMA  Additional Equipment: None  Intra-op Plan:   Post-operative Plan: Extubation in OR  Informed Consent: I have reviewed the patients History and Physical, chart, labs and discussed the procedure including the risks, benefits and alternatives for the proposed anesthesia with the patient or authorized representative who has indicated his/her understanding and acceptance.     Dental advisory given  Plan Discussed with: CRNA  Anesthesia Plan Comments:         Anesthesia Quick Evaluation

## 2018-11-18 NOTE — H&P (View-Only) (Signed)
Plan for revision of TMA tomorrow vs BKA Lengthy discussion with patient and POA  Danielle Buchanan

## 2018-11-18 NOTE — Progress Notes (Signed)
[facility] PROGRESS NOTE  Danielle ContesJanice M Buchanan WUJ:[id number] DOB: 02-23-1956 DOA: 11/11/2018 PCP: Shirlean MylarWebb, Carol, MD  Assessment/Plan: 1. Sepsis secondary to necrotic right third toe/osteomyelitis from Serratia. Sepsis resolved.  s/p transmetatarsal amputation with wound VAC placement 2/5.  Plan for revision TMA versus BKA by surgery on 2/12, n.p.o. at midnight, hold home Lantus prior to procedure. Continue IV ceftriaxone per ID recs, will have follow up arranged. PICC line placed. PT recommends SNF, patient is not interested. Will likely go home with home health PT once medically stable.    2. Severe stenosis of right peroneal artery, status post balloon angioplasty on 2/6.  No residual stenosis.  Continue home aspirin, Plavix added after procedure  3. Acute on chronic anemia, stable. Acute exacerbation during stay due to postoperative blood loss anemia.  Suspect patient has chronic anemia as has had low hemoglobin of 08/2018.  Prior to admission hemoglobin was 13.3, suspect this was hemoconcentrated?Marland Kitchen.  No active bleeding, continue to monitor CBC.   4. Type 2 diabetes, poorly controlled.  A1c 10.9% (11/12/2018).  On a significant amount of insulin at home (190 units total daily).  Only on lantus 53 U here--we will hold home Lantus on 2/12 prior to procedure.  Given significant postprandial hyperglycemia will add NovoLog 3 units 3 times daily. SSI moderate dose to continue as well.    5. Hypertension, stable.  Continue home losartan  6. Hyperlipidemia, stable continue home pravastatin  7. Depression, stable continue home Zoloft and Elavil   Code Status: full code Family Communication: no family at bedside Disposition Plan: continue IV antibiotics, plan for TMA versus BKA on 2/12, n.p.o. at midnight  Consultants:  Vascular Surgery , ID,   Procedures:  Transmetatarsal amputation of right foot (second through fifth digits) on 2/5 with wound VAC placement  Antibiotics:  IV ceftriaxone  2/5--  IV Flagyl 2/4-2/6  IV Zosyn 2/4 complete  IV Vanco 2/4 complete  HPI/Subjective:  Danielle ContesJanice M Buchanan is a 63 y.o. year old female with medical history  63 y.o. year old female with medical history significant for DM2, HTN, PAD s/p L AKA in 2018, R 4th toe amputation and who presented on 11/11/2018 with concern for grossly infected/necrotic third right toe after podiatrist outpatient visit and was found to have necrotic third right toe.  Outpatient XR documented as  bony destruction suggestive of osteomyelitis at the middle phalanx of the right third toe. No gas in soft tissues  This am reports pain is well controlled.  Denies any fevers or chills.  No acute complaints  Objective: Vitals:   11/18/18 0508 11/18/18 1410  BP: 131/65 (!) 151/73  Pulse: 69 73  Resp: 18   Temp: 98.2 F (36.8 C) 98.4 F (36.9 C)  SpO2: 95% 97%    Intake/Output Summary (Last 24 hours) at 11/18/2018 1744 Last data filed at 11/18/2018 1313 Gross per 24 hour  Intake 480 ml  Output 2201 ml  Net -1721 ml   Filed Weights   11/12/18 1357  Weight: 95.3 kg    Exam:  Constitutional:normal appearing female, no acute distress Eyes: EOMI, anicteric, normal conjunctivae ENMT: Oropharynx with moist mucous membranes Cardiovascular: RRR no MRGs, with no peripheral edema Respiratory: Normal respiratory effort on room air, clear breath sounds  Abdomen: Soft,non-tender, normal bowel sounds MSK:  2nd-4 digits on right foot removed, dry dressing in place, left AKA Neurologic: Grossly no focal neuro deficit. Psychiatric: Flat affect, and normal mood. Mental status AAOx3   Data Reviewed: Basic Metabolic  Panel: Recent Labs  Lab 11/11/18 1837 11/13/18 0206 11/17/18 0928 11/18/18 0358  NA 129* 134* 136 137  K 4.7 4.0 3.8 3.5  CL 91* 100 102 100  CO2 26 23 25 24   GLUCOSE 406* 193* 263* 210*  BUN 16 11 9 8   CREATININE 0.89 0.77 0.73 0.68  CALCIUM 9.3 8.3* 8.7* 8.9   Liver Function Tests: Recent Labs   Lab 11/11/18 1837  AST 12*  ALT 13  ALKPHOS 85  BILITOT 0.6  PROT 7.4  ALBUMIN 3.2*   No results for input(s): LIPASE, AMYLASE in the last 168 hours. No results for input(s): AMMONIA in the last 168 hours. CBC: Recent Labs  Lab 11/11/18 1837  11/14/18 0345 11/15/18 0343 11/16/18 0324 11/17/18 0928 11/18/18 0358  WBC 14.4*   < > 11.0* 9.9 10.2 9.8 9.8  NEUTROABS 11.5*  --   --   --   --   --   --   HGB 13.3   < > 10.7* 11.3* 12.1 11.5* 11.4*  HCT 41.2   < > 33.3* 34.4* 37.7 36.4 35.3*  MCV 87.8   < > 87.4 86.6 86.3 87.1 88.0  PLT 240   < > 209 260 308 307 328   < > = values in this interval not displayed.   Cardiac Enzymes: No results for input(s): CKTOTAL, CKMB, CKMBINDEX, TROPONINI in the last 168 hours. BNP (last 3 results) No results for input(s): BNP in the last 8760 hours.  ProBNP (last 3 results) No results for input(s): PROBNP in the last 8760 hours.  CBG: Recent Labs  Lab 11/17/18 1117 11/17/18 1627 11/17/18 2141 11/18/18 0702 11/18/18 1134  GLUCAP 225* 279* 235* 215* 152*    Recent Results (from the past 240 hour(s))  Blood Culture (routine x 2)     Status: None   Collection Time: 11/11/18  9:49 PM  Result Value Ref Range Status   Specimen Description BLOOD RIGHT ANTECUBITAL  Final   Special Requests   Final    BOTTLES DRAWN AEROBIC AND ANAEROBIC Blood Culture results may not be optimal due to an inadequate volume of blood received in culture bottles   Culture   Final    NO GROWTH 5 DAYS Performed at [facility], [address] [place], [place] [address]    Report Status 11/16/2018 FINAL  Final  Blood Culture (routine x 2)     Status: None   Collection Time: 11/11/18 10:06 PM  Result Value Ref Range Status   Specimen Description BLOOD RIGHT HAND  Final   Special Requests   Final    BOTTLES DRAWN AEROBIC AND ANAEROBIC Blood Culture adequate volume   Culture   Final    NO GROWTH 5 DAYS Performed at [facility], [address] [place], [place] [address]    Report Status 11/16/2018 FINAL  Final  MRSA PCR Screening     Status: None   Collection Time: 11/12/18  4:26 AM  Result Value Ref Range Status   MRSA by PCR NEGATIVE NEGATIVE Final    Comment:        The GeneXpert MRSA Assay (FDA approved for NASAL specimens only), is one component of a comprehensive MRSA colonization surveillance program. It is not intended to diagnose MRSA infection nor to guide or monitor treatment for MRSA infections. Performed at [facility], [address] [place], [place] [address]   Aerobic/Anaerobic Culture (surgical/deep wound)     Status: None  Collection Time: 11/12/18  4:35 PM  Result Value Ref Range Status   Specimen Description FOOT  Final   Special Requests RIGHT  Final   Gram Stain   Final    RARE WBC PRESENT,BOTH PMN AND MONONUCLEAR FEW GRAM POSITIVE COCCI RARE GRAM VARIABLE ROD    Culture   Final    MODERATE SERRATIA MARCESCENS NO ANAEROBES ISOLATED Performed at [facility], [address] [place], [place] [address]    Report Status 11/18/2018 FINAL  Final   Organism ID, Bacteria SERRATIA MARCESCENS  Final      Susceptibility   Serratia marcescens - MIC*    CEFAZOLIN >=64 RESISTANT Resistant     CEFEPIME <=1 SENSITIVE Sensitive     CEFTAZIDIME <=1 SENSITIVE Sensitive     CEFTRIAXONE <=1 SENSITIVE Sensitive     CIPROFLOXACIN <=0.25 SENSITIVE Sensitive     GENTAMICIN <=1 SENSITIVE Sensitive     TRIMETH/SULFA <=20 SENSITIVE Sensitive     * MODERATE SERRATIA MARCESCENS     Studies: No results found.  Scheduled Meds: . amitriptyline  25 mg Oral QHS  . aspirin EC  81 mg Oral Daily  . clopidogrel  75 mg Oral Q breakfast  . insulin aspart  0-15 Units Subcutaneous TID WC  . insulin aspart  3 Units Subcutaneous TID WC  . insulin glargine  53 Units Subcutaneous Daily  . losartan  100 mg Oral Daily  . multivitamin with minerals  1 tablet Per Tube Daily  . nutrition supplement (JUVEN)   1 packet Oral BID BM  . pravastatin  40 mg Oral Daily  . sertraline  100 mg Oral Daily  . sodium chloride flush  3 mL Intravenous Q12H  . traZODone  50 mg Oral QHS   Continuous Infusions: . cefTRIAXone (ROCEPHIN)  IV 2 g (11/18/18 0825)  . [START ON 11/19/2018] cefUROXime (ZINACEF)  IV    . lactated ringers Stopped (11/15/18 1808)    Principal Problem:   Osteomyelitis of third toe of right foot (HCC) Active Problems:   HTN (hypertension)   Depression   PAD (peripheral artery disease) (HCC)   Amputation of left lower extremity above knee upon examination (HCC)   Benign essential HTN   Diabetes mellitus type 2 in obese [facility])   Atherosclerosis of native arteries of the extremities with gangrene (HCC)      Laverna Peace  [facility]

## 2018-11-18 NOTE — Progress Notes (Signed)
Physical Therapy Treatment Patient Details Name: Danielle Buchanan MRN: [id number] DOB: 1956/07/15 Today's Date: 11/18/2018    History of Present Illness 63 yo female with history of injury to R 3rd toe in January, wound worsened and she now received amputation of R 2nd/3rd/5th toes on 11/12/2018. PMH DM, HTN, L AKA     PT Comments    Pt seen for mobility progression. Pt requires +2 assistance for functional transfers and attempted gait training. Continue to progress as tolerated with anticipated d/c to SNF for further skilled PT services.     Follow Up Recommendations  SNF     Equipment Recommendations  Other (comment)(defer to next venue)    Recommendations for Other Services       Precautions / Restrictions Precautions Precautions: Fall Restrictions Weight Bearing Restrictions: Yes RLE Weight Bearing: Weight bearing as tolerated Other Position/Activity Restrictions: WBAT through heel in darco shoe    Mobility  Bed Mobility Overal bed mobility: Needs Assistance Bed Mobility: Supine to Sit;Sit to Supine     Supine to sit: Min assist;HOB elevated Sit to supine: Min guard   General bed mobility comments: assist to elevate trunk into sitting and to bring L hip to EOB; pt able to scoot up for HOB in sitting without assistance  Transfers Overall transfer level: Needs assistance Equipment used: Rolling walker (2 wheeled) Transfers: Sit to/from Stand Sit to Stand: From elevated surface;Mod assist;+2 physical assistance         General transfer comment: assistance to power up into standing and then to gain balance upon standing; facilitation for hip extension   Ambulation/Gait Ambulation/Gait assistance: Mod assist;+2 safety/equipment Gait Distance (Feet): 2 Feet Assistive device: Rolling walker (2 wheeled) Gait Pattern/deviations: Step-to pattern;Decreased step length - right;Decreased step length - left;Trunk flexed Gait velocity: decreased   General Gait Details: pt  took 2 steps forward and 2 steps backwards with assistance for balance and to manage RW with cues for L foot placement as pt tends to adduct L LE too much decreasing BOS; pt attempts to sit prematurely   Stairs             Wheelchair Mobility    Modified Rankin (Stroke Patients Only)       Balance Overall balance assessment: Needs assistance Sitting-balance support: Feet supported;Single extremity supported Sitting balance-Leahy Scale: Fair Sitting balance - Comments: pt requires assistance to maintain sitting balance EOB at times Postural control: Left lateral lean Standing balance support: Bilateral upper extremity supported Standing balance-Leahy Scale: Poor                              Cognition Arousal/Alertness: Awake/alert Behavior During Therapy: WFL for tasks assessed/performed Overall Cognitive Status: No family/caregiver present to determine baseline cognitive functioning                                 General Comments: decreased safety awareness      Exercises      General Comments        Pertinent Vitals/Pain Pain Assessment: Faces Faces Pain Scale: Hurts a little bit Pain Location: R foot Pain Descriptors / Indicators: Aching Pain Intervention(s): Monitored during session;Limited activity within patient's tolerance;Repositioned    Home Living                      Prior Function  PT Goals (current goals can now be found in the care plan section) Acute Rehab PT Goals Patient Stated Goal: go home  Progress towards PT goals: Progressing toward goals    Frequency    Min 3X/week      PT Plan Current plan remains appropriate    Co-evaluation              AM-PAC PT "6 Clicks" Mobility   Outcome Measure  Help needed turning from your back to your side while in a flat bed without using bedrails?: A Little Help needed moving from lying on your back to sitting on the side of a flat bed  without using bedrails?: A Little Help needed moving to and from a bed to a chair (including a wheelchair)?: A Lot Help needed standing up from a chair using your arms (e.g., wheelchair or bedside chair)?: A Lot Help needed to walk in hospital room?: A Lot Help needed climbing 3-5 steps with a railing? : Total 6 Click Score: 13    End of Session Equipment Utilized During Treatment: Gait belt;Other (comment)(L LE prosthesis and darco shoe on right foot) Activity Tolerance: Patient tolerated treatment well;Patient limited by fatigue Patient left: in bed;with call bell/phone within reach;with bed alarm set Nurse Communication: Mobility status PT Visit Diagnosis: Unsteadiness on feet (R26.81);Muscle weakness (generalized) (M62.81);Difficulty in walking, not elsewhere classified (R26.2)     Time: 1335-1401 PT Time Calculation (min) (ACUTE ONLY): 26 min  Charges:  $Gait Training: 23-37 mins                     Erline LevineKellyn Alaric Gladwin, PTA Acute Rehabilitation Services Pager: [phone] Office: [phone]     Carolynne EdouardKellyn R Surya Schroeter 11/18/2018, 2:45 PM

## 2018-11-18 NOTE — Care Management Important Message (Signed)
Important Message  Patient Details  Name: Danielle Buchanan MRN: [id number] Date of Birth: 03-19-1956   Medicare Important Message Given:  Yes    Dorena Bodo 11/18/2018, 4:02 PM

## 2018-11-18 NOTE — Consult Note (Signed)
   [facility] CM Inpatient Consult   11/18/2018  Danielle Buchanan 1955-11-04 [id number]  Patient screened for a history with [facility] and  check to see if potential [facility] Care Management services are needed . Patient was hospitalized for per MD notes: 63 yo female with history of injury to R 3rd toe in January, wound worsened and she now received amputation of R 2nd/3rd/5th toes on 11/12/2018. PMH DM, HTN, L AKA. Patient is admitted with secondary osteomyelitis from [place]atia.  She is being recommended for SNF. Disposition is currently not known.  Primary Care Provider is  [name], MD.  Please place a [facility] Care Management consult or for questions contact:   Charlesetta Shanks, RN BSN CCM [facility]  [phone] business mobile phone Toll free office [phone]

## 2018-11-19 ENCOUNTER — Encounter (HOSPITAL_COMMUNITY): Payer: Self-pay | Admitting: Anesthesiology

## 2018-11-19 ENCOUNTER — Encounter (HOSPITAL_COMMUNITY)
Admission: EM | Disposition: A | Discharge status: Home Health | Payer: Self-pay | Source: Ambulatory Visit | Attending: Internal Medicine

## 2018-11-19 ENCOUNTER — Inpatient Hospital Stay (HOSPITAL_COMMUNITY): Payer: Medicare HMO | Admitting: Anesthesiology

## 2018-11-19 HISTORY — PX: TRANSMETATARSAL AMPUTATION: SHX6197

## 2018-11-19 LAB — BASIC METABOLIC PANEL
Anion gap: 6 (ref 5–15)
BUN: 11 mg/dL (ref 8–23)
CO2: 28 mmol/L (ref 22–32)
Calcium: 8.9 mg/dL (ref 8.9–10.3)
Chloride: 104 mmol/L (ref 98–111)
Creatinine, Ser: 0.72 mg/dL (ref 0.44–1.00)
GFR calc Af Amer: >60 mL/min (ref >60)
GLUCOSE: 168 mg/dL — AB (ref 70–99)
Potassium: 3.7 mmol/L (ref 3.5–5.1)
Sodium: 138 mmol/L (ref 135–145)

## 2018-11-19 LAB — CBC
HCT: 35.3 % — ABNORMAL LOW (ref 36.0–46.0)
Hemoglobin: 11.3 g/dL — ABNORMAL LOW (ref 12.0–15.0)
MCH: 28.3 pg (ref 26.0–34.0)
MCHC: 32 g/dL (ref 30.0–36.0)
MCV: 88.3 fL (ref 80.0–100.0)
PLATELETS: 321 10*3/uL (ref 150–400)
RBC: 4 MIL/uL (ref 3.87–5.11)
RDW: 12.9 % (ref 11.5–15.5)
WBC: 9.6 10*3/uL (ref 4.0–10.5)
nRBC: 0 % (ref 0.0–0.2)

## 2018-11-19 LAB — GLUCOSE, CAPILLARY
GLUCOSE-CAPILLARY: 254 mg/dL — AB (ref 70–99)
Glucose-Capillary: 162 mg/dL — ABNORMAL HIGH (ref 70–99)
Glucose-Capillary: 164 mg/dL — ABNORMAL HIGH (ref 70–99)
Glucose-Capillary: 250 mg/dL — ABNORMAL HIGH (ref 70–99)
Glucose-Capillary: 379 mg/dL — ABNORMAL HIGH (ref 70–99)

## 2018-11-19 LAB — PROTIME-INR
INR: 1.08
Prothrombin Time: 13.9 seconds (ref 11.4–15.2)

## 2018-11-19 SURGERY — AMPUTATION, FOOT, TRANSMETATARSAL
Anesthesia: General | Site: Foot | Laterality: Right

## 2018-11-19 MED ORDER — FENTANYL CITRATE (PF) 100 MCG/2ML IJ SOLN
12.5000 ug | INTRAMUSCULAR | Status: DC | PRN
  Administered 2018-11-19 – 2018-11-20 (×6): 12.5 ug via INTRAVENOUS
  Filled 2018-11-19 (×6): qty 2

## 2018-11-19 MED ORDER — SENNOSIDES-DOCUSATE SODIUM 8.6-50 MG PO TABS
1.0000 | ORAL_TABLET | Freq: Two times a day (BID) | ORAL | Status: DC
  Administered 2018-11-19 – 2018-11-22 (×7): 1 via ORAL
  Filled 2018-11-19 (×7): qty 1

## 2018-11-19 MED ORDER — LIDOCAINE 2% (20 MG/ML) 5 ML SYRINGE
INTRAMUSCULAR | Status: DC | PRN
  Administered 2018-11-19: 60 mg via INTRAVENOUS

## 2018-11-19 MED ORDER — PROMETHAZINE HCL 25 MG/ML IJ SOLN: 6.2500 mg | INTRAMUSCULAR | Status: DC | PRN

## 2018-11-19 MED ORDER — DEXAMETHASONE SODIUM PHOSPHATE 4 MG/ML IJ SOLN
INTRAMUSCULAR | Status: DC | PRN
  Administered 2018-11-19: 5 mg via INTRAVENOUS

## 2018-11-19 MED ORDER — DEXAMETHASONE SODIUM PHOSPHATE 10 MG/ML IJ SOLN
INTRAMUSCULAR | Status: AC
  Filled 2018-11-19: qty 1

## 2018-11-19 MED ORDER — PROPOFOL 10 MG/ML IV BOLUS
INTRAVENOUS | Status: AC
  Filled 2018-11-19: qty 40

## 2018-11-19 MED ORDER — EPHEDRINE 5 MG/ML INJ
INTRAVENOUS | Status: AC
  Filled 2018-11-19: qty 10

## 2018-11-19 MED ORDER — MIDAZOLAM HCL 5 MG/5ML IJ SOLN
INTRAMUSCULAR | Status: DC | PRN
  Administered 2018-11-19: .5 mg via INTRAVENOUS

## 2018-11-19 MED ORDER — GLYCOPYRROLATE 0.2 MG/ML IJ SOLN
INTRAMUSCULAR | Status: DC | PRN
  Administered 2018-11-19: 0.2 mg via INTRAVENOUS

## 2018-11-19 MED ORDER — ONDANSETRON HCL 4 MG/2ML IJ SOLN
INTRAMUSCULAR | Status: DC | PRN
  Administered 2018-11-19: 4 mg via INTRAVENOUS

## 2018-11-19 MED ORDER — FENTANYL CITRATE (PF) 250 MCG/5ML IJ SOLN
INTRAMUSCULAR | Status: AC
  Filled 2018-11-19: qty 5

## 2018-11-19 MED ORDER — MIDAZOLAM HCL 2 MG/2ML IJ SOLN
INTRAMUSCULAR | Status: AC
  Filled 2018-11-19: qty 2

## 2018-11-19 MED ORDER — PHENYLEPHRINE HCL 10 MG/ML IJ SOLN
INTRAMUSCULAR | Status: DC | PRN
  Administered 2018-11-19 (×2): 80 ug via INTRAVENOUS

## 2018-11-19 MED ORDER — MEPERIDINE HCL 50 MG/ML IJ SOLN: 6.2500 mg | INTRAMUSCULAR | Status: DC | PRN

## 2018-11-19 MED ORDER — LIDOCAINE 2% (20 MG/ML) 5 ML SYRINGE
INTRAMUSCULAR | Status: AC
  Filled 2018-11-19: qty 5

## 2018-11-19 MED ORDER — FENTANYL CITRATE (PF) 100 MCG/2ML IJ SOLN
INTRAMUSCULAR | Status: DC | PRN
  Administered 2018-11-19: 50 ug via INTRAVENOUS

## 2018-11-19 MED ORDER — POLYETHYLENE GLYCOL 3350 17 G PO PACK: 17.0000 g | PACK | Freq: Every day | ORAL | Status: DC | PRN

## 2018-11-19 MED ORDER — ONDANSETRON HCL 4 MG/2ML IJ SOLN
INTRAMUSCULAR | Status: AC
  Filled 2018-11-19: qty 2

## 2018-11-19 MED ORDER — FENTANYL CITRATE (PF) 100 MCG/2ML IJ SOLN: 25.0000 ug | INTRAMUSCULAR | Status: DC | PRN

## 2018-11-19 MED ORDER — 0.9 % SODIUM CHLORIDE (POUR BTL) OPTIME
TOPICAL | Status: DC | PRN
  Administered 2018-11-19: 1000 mL

## 2018-11-19 MED ORDER — PHENYLEPHRINE 40 MCG/ML (10ML) SYRINGE FOR IV PUSH (FOR BLOOD PRESSURE SUPPORT)
PREFILLED_SYRINGE | INTRAVENOUS | Status: AC
  Filled 2018-11-19: qty 10

## 2018-11-19 MED ORDER — EPHEDRINE SULFATE 50 MG/ML IJ SOLN
INTRAMUSCULAR | Status: DC | PRN
  Administered 2018-11-19 (×2): 10 mg via INTRAVENOUS

## 2018-11-19 MED ORDER — PROPOFOL 10 MG/ML IV BOLUS
INTRAVENOUS | Status: DC | PRN
  Administered 2018-11-19: 110 mg via INTRAVENOUS

## 2018-11-19 MED ORDER — SODIUM CHLORIDE 0.9 % IV SOLN
INTRAVENOUS | Status: DC | PRN
  Administered 2018-11-19: 08:00:00 via INTRAVENOUS

## 2018-11-19 MED ORDER — INSULIN GLARGINE 100 UNIT/ML ~~LOC~~ SOLN
53.0000 [IU] | Freq: Every day | SUBCUTANEOUS | Status: DC
  Administered 2018-11-19 – 2018-11-21 (×3): 53 [IU] via SUBCUTANEOUS
  Filled 2018-11-19 (×4): qty 0.53

## 2018-11-19 SURGICAL SUPPLY — 42 items
BANDAGE ACE 4X5 VEL STRL LF (GAUZE/BANDAGES/DRESSINGS) ×3 IMPLANT
BLADE AVERAGE 25MMX9MM (BLADE)
BLADE AVERAGE 25X9 (BLADE) IMPLANT
BLADE CORE FAN STRYKER (BLADE) ×2 IMPLANT
BNDG CONFORM 3 STRL LF (GAUZE/BANDAGES/DRESSINGS) IMPLANT
BNDG GAUZE ELAST 4 BULKY (GAUZE/BANDAGES/DRESSINGS) ×3 IMPLANT
CANISTER SUCT 3000ML PPV (MISCELLANEOUS) ×3 IMPLANT
COVER SURGICAL LIGHT HANDLE (MISCELLANEOUS) ×3 IMPLANT
COVER WAND RF STERILE (DRAPES) ×3 IMPLANT
DRAPE EXTREMITY T 121X128X90 (DISPOSABLE) ×3 IMPLANT
DRAPE HALF SHEET 40X57 (DRAPES) ×3 IMPLANT
DRSG VAC ATS SM SENSATRAC (GAUZE/BANDAGES/DRESSINGS) ×2 IMPLANT
ELECT REM PT RETURN 9FT ADLT (ELECTROSURGICAL) ×3
ELECTRODE REM PT RTRN 9FT ADLT (ELECTROSURGICAL) ×1 IMPLANT
GAUZE SPONGE 4X4 12PLY STRL (GAUZE/BANDAGES/DRESSINGS) ×3 IMPLANT
GLOVE BIOGEL PI IND STRL 7.5 (GLOVE) ×1 IMPLANT
GLOVE BIOGEL PI INDICATOR 7.5 (GLOVE) ×2
GLOVE SURG SS PI 7.5 STRL IVOR (GLOVE) ×3 IMPLANT
GOWN STRL REUS W/ TWL LRG LVL3 (GOWN DISPOSABLE) ×2 IMPLANT
GOWN STRL REUS W/ TWL XL LVL3 (GOWN DISPOSABLE) ×1 IMPLANT
GOWN STRL REUS W/TWL LRG LVL3 (GOWN DISPOSABLE) ×4
GOWN STRL REUS W/TWL XL LVL3 (GOWN DISPOSABLE) ×2
KIT BASIN OR (CUSTOM PROCEDURE TRAY) ×3 IMPLANT
KIT TURNOVER KIT B (KITS) ×3 IMPLANT
NDL HYPO 25GX1X1/2 BEV (NEEDLE) IMPLANT
NEEDLE HYPO 25GX1X1/2 BEV (NEEDLE) IMPLANT
NS IRRIG 1000ML POUR BTL (IV SOLUTION) ×3 IMPLANT
PACK GENERAL/GYN (CUSTOM PROCEDURE TRAY) ×3 IMPLANT
PAD ARMBOARD 7.5X6 YLW CONV (MISCELLANEOUS) ×6 IMPLANT
SPECIMEN JAR SMALL (MISCELLANEOUS) ×3 IMPLANT
SUT ETHILON 2 0 FS 18 (SUTURE) ×2 IMPLANT
SUT ETHILON 3 0 PS 1 (SUTURE) ×3 IMPLANT
SUT VIC AB 2-0 CT1 27 (SUTURE) ×2
SUT VIC AB 2-0 CT1 TAPERPNT 27 (SUTURE) IMPLANT
SUT VIC AB 3-0 SH 27 (SUTURE) ×4
SUT VIC AB 3-0 SH 27X BRD (SUTURE) IMPLANT
SYR CONTROL 10ML LL (SYRINGE) IMPLANT
TOWEL GREEN STERILE (TOWEL DISPOSABLE) ×6 IMPLANT
TOWEL GREEN STERILE FF (TOWEL DISPOSABLE) ×3 IMPLANT
UNDERPAD 30X30 (UNDERPADS AND DIAPERS) ×3 IMPLANT
WATER STERILE IRR 1000ML POUR (IV SOLUTION) ×3 IMPLANT
WND VAC CANISTER 500ML (MISCELLANEOUS) ×2 IMPLANT

## 2018-11-19 NOTE — Anesthesia Procedure Notes (Signed)
Procedure Name: LMA Insertion Date/Time: 11/19/2018 8:44 AM Performed by: Fransisca Kaufmann, CRNA Pre-anesthesia Checklist: Patient identified, Emergency Drugs available, Suction available and Patient being monitored Patient Re-evaluated:Patient Re-evaluated prior to induction Oxygen Delivery Method: Circle System Utilized Preoxygenation: Pre-oxygenation with 100% oxygen Induction Type: IV induction Ventilation: Mask ventilation without difficulty LMA: LMA inserted LMA Size: 4.0 Number of attempts: 1 Airway Equipment and Method: Bite block Placement Confirmation: positive ETCO2 Tube secured with: Tape Dental Injury: Teeth and Oropharynx as per pre-operative assessment

## 2018-11-19 NOTE — Transfer of Care (Signed)
Immediate Anesthesia Transfer of Care Note  Patient: Danielle Buchanan  Procedure(s) Performed: TRANSMETATARSAL AMPUTATION REVISION (Right Foot)  Patient Location: PACU  Anesthesia Type:General  Level of Consciousness: awake, alert , oriented and sedated  Airway & Oxygen Therapy: Patient Spontanous Breathing and Patient connected to nasal cannula oxygen  Post-op Assessment: Report given to RN, Post -op Vital signs reviewed and stable and Patient moving all extremities  Post vital signs: Reviewed and stable  Last Vitals:  Vitals Value Taken Time  BP 108/53 11/19/2018  9:49 AM  Temp    Pulse 73 11/19/2018  9:50 AM  Resp 13 11/19/2018  9:50 AM  SpO2 94 % 11/19/2018  9:50 AM  Vitals shown include unvalidated device data.  Last Pain:  Vitals:   11/18/18 2304  TempSrc:   PainSc: 3       Patients Stated Pain Goal: 3 (11/17/18 0856)  Complications: No apparent anesthesia complications

## 2018-11-19 NOTE — Anesthesia Postprocedure Evaluation (Signed)
Anesthesia Post Note  Patient: Danielle Buchanan  Procedure(s) Performed: TRANSMETATARSAL AMPUTATION REVISION (Right Foot)     Patient location during evaluation: PACU Anesthesia Type: General Level of consciousness: sedated and patient cooperative Pain management: pain level controlled Vital Signs Assessment: post-procedure vital signs reviewed and stable Respiratory status: spontaneous breathing Cardiovascular status: stable Anesthetic complications: no    Last Vitals:  Vitals:   11/19/18 1015 11/19/18 1041  BP: (!) 117/57 111/61  Pulse: 72 68  Resp: 13   Temp:  36.4 C  SpO2: 94% 90%    Last Pain:  Vitals:   11/19/18 1041  TempSrc: Oral  PainSc:     LLE Motor Response: Purposeful movement;Responds to commands (11/19/18 1156) LLE Sensation: Full sensation (11/19/18 1156)   RLE Sensation: Full sensation;Pain (11/19/18 1156)      Lewie Loron

## 2018-11-19 NOTE — Interval H&P Note (Signed)
History and Physical Interval Note:  11/19/2018 7:29 AM  Danielle Buchanan  has presented today for surgery, with the diagnosis of NONVIABLE TISSUE RIGHT FOOT  The various methods of treatment have been discussed with the patient and family. After consideration of risks, benefits and other options for treatment, the patient has consented to  Procedure(s): TRANSMETATARSAL AMPUTATION REVISION (Right) as a surgical intervention .  The patient's history has been reviewed, patient examined, no change in status, stable for surgery.  I have reviewed the patient's chart and labs.  Questions were answered to the patient's satisfaction.     [name]

## 2018-11-19 NOTE — Op Note (Signed)
    Patient name: Danielle Buchanan MRN: [id number] DOB: 1956/03/21 Sex: female  11/19/2018 Pre-operative Diagnosis: non-healing right TMA Post-operative diagnosis:  Same Surgeon:  [name] Procedure:   #1: Re-do reght TMA amputation   #2: Placement of right forefoot wound VAC Anesthesia:  General Blood Loss:  minimal Specimens:  Right forefoot  Findings:  Decent capillary bleeding  Indications: Last week the patient underwent right forefoot amputation, leaving her with her great toe.  Her flap has necrosed and she comes in today for more proximal amputation.  I had a lengthy discussion with her and her power of attorney.  She really wants to try and save her foot as she does significant transfers with this and remains independent.  She understands that if we proceed with revision of her transmetatarsal amputation that she is at risk for nonhealing and would need a more proximal amputation.  Procedure:  The patient was identified in the holding area and taken to [facility] OR ROOM 16  The patient was then placed supine on the table. general anesthesia was administered.  The patient was prepped and draped in the usual sterile fashion.  A time out was called and antibiotics were administered.  A fishmouth type incision was made at the midfoot, making sure to exclude all marginal tissue.  This was done with a 10 blade and was taken down to the bone.  Periosteal elevator was used to elevate the periosteum.  An oscillating saw was used to cut through the tarsal bones.  Remaining subcutaneous and deep tissue and tendons were divided with curved Mayo scissors.  The capillary bleeding at this level was better than expected.  All devitalized tissue was removed.  A rasp was used to smooth the bone surfaces.  The wound was then irrigated.  The skin was reapproximated with 2-0 nylon vertical mattress sutures and a wound VAC was placed.   Disposition: To PACU stable   V. [name], M.D. [facility] [place] Office: [phone] Pager:  [phone]

## 2018-11-19 NOTE — Progress Notes (Signed)
PROGRESS NOTE    Danielle Buchanan  ZOX:[id number] DOB: July 25, 1956 DOA: 11/11/2018 PCP: [name], MD  Brief Narrative:  Danielle Buchanan is a 63 y.o. year old female with medical history significant for buy not limited to DM2, HTN, PAD s/p L AKA in 2018, R 4th toe amputation andwho presented on2/4/2020with concern for grossly infected/necrotic third right toe after podiatrist outpatient visitand was found to havenecrotic third right toe.  Outpatient XR documented as bony destruction suggestive of osteomyelitisat the middle phalanx of the right third toe. No gas in soft tissues. She underwent Transmetatarsal amputation on 11/12/2018 (Amputation of toes 2-5) with revision today (removal of big toe) by Dr. Myra Gianotti. Post-operatively she is having some pain.   Assessment & Plan:   Principal Problem:   Osteomyelitis of third toe of right foot (HCC) Active Problems:   HTN (hypertension)   Depression   PAD (peripheral artery disease) (HCC)   Amputation of left lower extremity above knee upon examination (HCC)   Benign essential HTN   Diabetes mellitus type 2 in obese [facility])   Atherosclerosis of native arteries of the extremities with gangrene (HCC)  Sepsis secondary to necrotic right third toe/osteomyelitis from Serratia. S/p Transmetatarsal amputation with Wound VAC placement on 2/5/20202 with Revision of TMA today -Sepsis resolved.  -Underwent revision today -Continue IV Ceftriaxone per ID recs, will have follow up arranged. Per Dr. Daiva Eves plan is treating her aggressively for 6 weeks of IV Ceftriaxone  -PICC line placed. PT recommends SNF, patient is not interested.  -Will likely go home with home health PT once medically stable.   -C/w Pain control with Acetaminophen 650 mg q4hprn, Oxycodone-Acetaminophen 1-2 tab po q6hprn for Moderate,Severe Pain -C/w IV Fentanyl 12.5 mcg q2hprn for Uncontrolled Pain not amenable to po  -Will add bowel regimen with Senna-Docusate 1 tab po BID and  Miralax 17 grams po Dailyprn for Moderate Constipation   Severe stenosis of right peroneal artery, status post balloon angioplasty on 2/6.   -No residual stenosis.  -Continue home Aspirin 81 mg po daily and Clopidogrel 75 mg po Daily added after procedure  Acute on Chronic Normocytic Anemia -Stable.  -Acute exacerbation during stay due to postoperative blood loss anemia.   -Suspect patient has chronic anemia as has had low hemoglobin of 08/2018.   -Prior to admission hemoglobin was 13.3, suspect this was hemoconcentrated?.   -Hb/Hct is now 11.3/35.3 -No active bleeding currently but will continue to Monitor for S/Sx of Bleeding -Repeat CBC in AM    Type 2 Diabetes, poorly controlled.   -HemoglobinA1c 10.9% (11/12/2018).   -On a significant amount of insulin at home (190 units total daily).   -Only on lantus 53 U here -Held home Lantus on 2/12 prior to procedure but will resume now  -Given significant postprandial hyperglycemia will add NovoLog 3 units 3 times daily.  -C/w Moderate Novolog SSI AC -CBG's ranging from 162-250  Hypertension -Stable. -Continue home Losartan 100 mg po Daily -Continue with IV Hydralazine 5 mg q20 minprn for High Blood Pressure x2 doses  Hyperlipidemia -Stable. -C/w Home Pravastatin 40 mg po Daily   Depression and Anxiety; Insomnia  -Stable  -Continue home Sertraline 100 mg po Daily and Amitriptyline 25 mg po qHS -C/w Lorazepam 0.5 mg po BIDprn Anxiety -C/w Trazodone 50 mg po qHS  Tobacco Abuse/Smoker -Smoking Cessation Counseling given -C/w Nicotine Polacrilex gum 2 mg po Dailyprn Smoking Cessation   Obesity -Estimated body mass index is 34.95 kg/m as calculated from the  following:   Height as of this encounter: 5\' 5"  (1.651 m).   Weight as of this encounter: 95.3 kg. -Weight Loss and Dietary Counseling given   DVT prophylaxis: SCDs Code Status: FULL CODE Family Communication: No family present at bedside  Disposition Plan: Home  Health with PT/OT in the next few days   Consultants:   Vascular Surgery   Infectious Diseases   WOC Nurse   Procedures:  Procedure Performed on 11/12/2018 by Dr. Myra GianottiBrabham             1.  Ultrasound-guided access, left femoral artery             2.  Abdominal aortogram             3.  Right lower extremity runoff             4.  Angioplasty, right tibioperoneal trunk and peroneal artery             5.  Closure device ([employer])             6.  Transmetatarsal amputation, toes 2 through 5             7.  Placement of transmetatarsal amputation site wound VAC   Antimicrobials:  Anti-infectives (From admission, onward)   Start     Dose/Rate Route Frequency Ordered Stop   11/19/18 0600  cefUROXime (ZINACEF) 1.5 g in sodium chloride 0.9 % 100 mL IVPB     1.5 g 200 mL/hr over 30 Minutes Intravenous On call to O.R. 11/18/18 1210 11/19/18 0850   11/15/18 1000  cefTRIAXone (ROCEPHIN) 2 g in sodium chloride 0.9 % 100 mL IVPB     2 g 200 mL/hr over 30 Minutes Intravenous Every 24 hours 11/14/18 1110     11/12/18 0800  cefTRIAXone (ROCEPHIN) 2 g in sodium chloride 0.9 % 100 mL IVPB  Status:  Discontinued     2 g 200 mL/hr over 30 Minutes Intravenous Every 24 hours 11/12/18 0243 11/14/18 0932   11/12/18 0600  metroNIDAZOLE (FLAGYL) tablet 500 mg  Status:  Discontinued     500 mg Oral Every 8 hours 11/12/18 0243 11/14/18 0932   11/11/18 2145  vancomycin (VANCOCIN) IVPB 1000 mg/200 mL premix     1,000 mg 200 mL/hr over 60 Minutes Intravenous  Once 11/11/18 2132 11/11/18 2333   11/11/18 2145  piperacillin-tazobactam (ZOSYN) IVPB 3.375 g     3.375 g 100 mL/hr over 30 Minutes Intravenous  Once 11/11/18 2132 11/11/18 2333     Subjective: Seen and examined at bedside after her surgical intervention and she states that she is doing okay.  States the pain in her right transmetatarsal amputation was a 6 out of 10.  No nausea or vomiting.  No lightheadedness or dizziness.  Has no other complaints or  concerns at this time.  Objective: Vitals:   11/18/18 0508 11/18/18 1410 11/18/18 2002 11/19/18 0347  BP: 131/65 (!) 151/73 (!) 165/72 (!) 149/65  Pulse: 69 73 74 60  Resp: 18  18 20   Temp: 98.2 F (36.8 C) 98.4 F (36.9 C) 98.4 F (36.9 C) 98.8 F (37.1 C)  TempSrc: Oral Oral Oral   SpO2: 95% 97% 90% 97%  Weight:      Height:        Intake/Output Summary (Last 24 hours) at 11/19/2018 0859 Last data filed at 11/18/2018 1313 Gross per 24 hour  Intake 240 ml  Output -  Net 240 ml  Filed Weights   11/12/18 1357  Weight: 95.3 kg   Examination: Physical Exam:  Constitutional: WN/WD obese Caucasian female NAD and appears calm but slightly uncomfortable Eyes: Lids and conjunctivae normal, sclerae anicteric  ENMT: External Ears, Nose appear normal. Grossly normal hearing.  Neck: Appears normal, supple, no cervical masses, normal ROM, no appreciable thyromegaly; no JVD Respiratory: Diminished to auscultation bilaterally, no wheezing, rales, rhonchi or crackles. Normal respiratory effort and patient is not tachypenic. No accessory muscle use.  Cardiovascular: RRR, no murmurs / rubs / gallops. S1 and S2 auscultated. No extremity edema.  Abdomen: Soft, non-tender, Distended due to body habitus. No masses palpated. No appreciable hepatosplenomegaly. Bowel sounds positive.  GU: Deferred. Musculoskeletal: Has a Right Transmetatarsal Amputation connected to a wound VAC; Left AKA Skin: No rashes, lesions, ulcers on a limited skin evaluation. No induration; Warm and dry.  Neurologic: CN 2-12 grossly intact with no focal deficits. Romberg sign and cerebellar reflexes not assessed.  Psychiatric: Normal judgment and insight. Alert and oriented x 3. Normal mood and appropriate affect.   Data Reviewed: I have personally reviewed following labs and imaging studies  CBC: Recent Labs  Lab 11/15/18 0343 11/16/18 0324 11/17/18 0928 11/18/18 0358 11/19/18 0444  WBC 9.9 10.2 9.8 9.8 9.6    HGB 11.3* 12.1 11.5* 11.4* 11.3*  HCT 34.4* 37.7 36.4 35.3* 35.3*  MCV 86.6 86.3 87.1 88.0 88.3  PLT 260 308 307 328 321   Basic Metabolic Panel: Recent Labs  Lab 11/13/18 0206 11/17/18 0928 11/18/18 0358 11/19/18 0444  NA 134* 136 137 138  K 4.0 3.8 3.5 3.7  CL 100 102 100 104  CO2 23 25 24 28   GLUCOSE 193* 263* 210* 168*  BUN 11 9 8 11   CREATININE 0.77 0.73 0.68 0.72  CALCIUM 8.3* 8.7* 8.9 8.9   GFR: Estimated Creatinine Clearance: 83.2 mL/min (by C-G formula based on SCr of 0.72 mg/dL). Liver Function Tests: No results for input(s): AST, ALT, ALKPHOS, BILITOT, PROT, ALBUMIN in the last 168 hours. No results for input(s): LIPASE, AMYLASE in the last 168 hours. No results for input(s): AMMONIA in the last 168 hours. Coagulation Profile: Recent Labs  Lab 11/19/18 0444  INR 1.08   Cardiac Enzymes: No results for input(s): CKTOTAL, CKMB, CKMBINDEX, TROPONINI in the last 168 hours. BNP (last 3 results) No results for input(s): PROBNP in the last 8760 hours. HbA1C: No results for input(s): HGBA1C in the last 72 hours. CBG: Recent Labs  Lab 11/18/18 0702 11/18/18 1134 11/18/18 1730 11/18/18 2113 11/19/18 0620  GLUCAP 215* 152* 214* 207* 162*   Lipid Profile: No results for input(s): CHOL, HDL, LDLCALC, TRIG, CHOLHDL, LDLDIRECT in the last 72 hours. Thyroid Function Tests: No results for input(s): TSH, T4TOTAL, FREET4, T3FREE, THYROIDAB in the last 72 hours. Anemia Panel: No results for input(s): VITAMINB12, FOLATE, FERRITIN, TIBC, IRON, RETICCTPCT in the last 72 hours. Sepsis Labs: No results for input(s): PROCALCITON, LATICACIDVEN in the last 168 hours.  Recent Results (from the past 240 hour(s))  Blood Culture (routine x 2)     Status: None   Collection Time: 11/11/18  9:49 PM  Result Value Ref Range Status   Specimen Description BLOOD RIGHT ANTECUBITAL  Final   Special Requests   Final    BOTTLES DRAWN AEROBIC AND ANAEROBIC Blood Culture results may not  be optimal due to an inadequate volume of blood received in culture bottles   Culture   Final    NO GROWTH 5 DAYS Performed  at [facility], [address] [place], [place] [address]    Report Status 11/16/2018 FINAL  Final  Blood Culture (routine x 2)     Status: None   Collection Time: 11/11/18 10:06 PM  Result Value Ref Range Status   Specimen Description BLOOD RIGHT HAND  Final   Special Requests   Final    BOTTLES DRAWN AEROBIC AND ANAEROBIC Blood Culture adequate volume   Culture   Final    NO GROWTH 5 DAYS Performed at [facility], [address] [place], [place] [address]    Report Status 11/16/2018 FINAL  Final  MRSA PCR Screening     Status: None   Collection Time: 11/12/18  4:26 AM  Result Value Ref Range Status   MRSA by PCR NEGATIVE NEGATIVE Final    Comment:        The GeneXpert MRSA Assay (FDA approved for NASAL specimens only), is one component of a comprehensive MRSA colonization surveillance program. It is not intended to diagnose MRSA infection nor to guide or monitor treatment for MRSA infections. Performed at [facility], [address] [place], [place] [address]   Aerobic/Anaerobic Culture (surgical/deep wound)     Status: None   Collection Time: 11/12/18  4:35 PM  Result Value Ref Range Status   Specimen Description FOOT  Final   Special Requests RIGHT  Final   Gram Stain   Final    RARE WBC PRESENT,BOTH PMN AND MONONUCLEAR FEW GRAM POSITIVE COCCI RARE GRAM VARIABLE ROD    Culture   Final    MODERATE SERRATIA MARCESCENS NO ANAEROBES ISOLATED Performed at [facility], [address] [place], [place] [address]    Report Status 11/18/2018 FINAL  Final   Organism ID, Bacteria SERRATIA MARCESCENS  Final      Susceptibility   Serratia marcescens - MIC*    CEFAZOLIN >=64 RESISTANT Resistant     CEFEPIME <=1 SENSITIVE Sensitive     CEFTAZIDIME <=1 SENSITIVE Sensitive     CEFTRIAXONE <=1 SENSITIVE Sensitive      CIPROFLOXACIN <=0.25 SENSITIVE Sensitive     GENTAMICIN <=1 SENSITIVE Sensitive     TRIMETH/SULFA <=20 SENSITIVE Sensitive     * MODERATE SERRATIA MARCESCENS    Radiology Studies: No results found.  Scheduled Meds: . [MAR Hold] amitriptyline  25 mg Oral QHS  . [MAR Hold] aspirin EC  81 mg Oral Daily  . [MAR Hold] clopidogrel  75 mg Oral Q breakfast  . [MAR Hold] insulin aspart  0-15 Units Subcutaneous TID WC  . [MAR Hold] insulin aspart  3 Units Subcutaneous TID WC  . [MAR Hold] losartan  100 mg Oral Daily  . [MAR Hold] multivitamin with minerals  1 tablet Per Tube Daily  . [MAR Hold] nutrition supplement (JUVEN)  1 packet Oral BID BM  . [MAR Hold] pravastatin  40 mg Oral Daily  . [MAR Hold] sertraline  100 mg Oral Daily  . [MAR Hold] sodium chloride flush  3 mL Intravenous Q12H  . [MAR Hold] traZODone  50 mg Oral QHS   Continuous Infusions: . [MAR Hold] cefTRIAXone (ROCEPHIN)  IV 2 g (11/18/18 0825)  . lactated ringers Stopped (11/15/18 1808)    LOS: 7 days   Merlene Laughtermair Latif Sheikh, DO [facility] PAGER is on AMION  If 7PM-7AM, please contact night-coverage [email or web address] Password [email or web address] 11/19/2018, 8:59 AM

## 2018-11-20 ENCOUNTER — Encounter (HOSPITAL_COMMUNITY): Payer: Self-pay | Admitting: Surgery

## 2018-11-20 LAB — CBC WITH DIFFERENTIAL/PLATELET
Abs Immature Granulocytes: 0.68 10*3/uL — ABNORMAL HIGH (ref 0.00–0.07)
Basophils Absolute: 0.1 10*3/uL (ref 0.0–0.1)
Basophils Relative: 1 %
Eosinophils Absolute: 0.3 10*3/uL (ref 0.0–0.5)
Eosinophils Relative: 3 %
HCT: 33.3 % — ABNORMAL LOW (ref 36.0–46.0)
Hemoglobin: 10.3 g/dL — ABNORMAL LOW (ref 12.0–15.0)
Immature Granulocytes: 6 %
Lymphocytes Relative: 19 %
Lymphs Abs: 2.2 10*3/uL (ref 0.7–4.0)
MCH: 27.4 pg (ref 26.0–34.0)
MCHC: 30.9 g/dL (ref 30.0–36.0)
MCV: 88.6 fL (ref 80.0–100.0)
Monocytes Absolute: 0.5 10*3/uL (ref 0.1–1.0)
Monocytes Relative: 4 %
NEUTROS PCT: 67 %
NRBC: 0 % (ref 0.0–0.2)
Neutro Abs: 7.5 10*3/uL (ref 1.7–7.7)
Platelets: 308 10*3/uL (ref 150–400)
RBC: 3.76 MIL/uL — AB (ref 3.87–5.11)
RDW: 13.2 % (ref 11.5–15.5)
WBC: 11.2 10*3/uL — ABNORMAL HIGH (ref 4.0–10.5)

## 2018-11-20 LAB — GLUCOSE, CAPILLARY
Glucose-Capillary: 244 mg/dL — ABNORMAL HIGH (ref 70–99)
Glucose-Capillary: 219 mg/dL — ABNORMAL HIGH (ref 70–99)
Glucose-Capillary: 134 mg/dL — ABNORMAL HIGH (ref 70–99)
Glucose-Capillary: 188 mg/dL — ABNORMAL HIGH (ref 70–99)
Glucose-Capillary: 220 mg/dL — ABNORMAL HIGH (ref 70–99)

## 2018-11-20 LAB — COMPREHENSIVE METABOLIC PANEL
ALT: 15 U/L (ref 0–44)
AST: 12 U/L — ABNORMAL LOW (ref 15–41)
Albumin: 2.6 g/dL — ABNORMAL LOW (ref 3.5–5.0)
Alkaline Phosphatase: 56 U/L (ref 38–126)
Anion gap: 11 (ref 5–15)
BILIRUBIN TOTAL: 0.4 mg/dL (ref 0.3–1.2)
BUN: 14 mg/dL (ref 8–23)
CO2: 25 mmol/L (ref 22–32)
CREATININE: 0.78 mg/dL (ref 0.44–1.00)
Calcium: 8.4 mg/dL — ABNORMAL LOW (ref 8.9–10.3)
Chloride: 101 mmol/L (ref 98–111)
GFR calc Af Amer: >60 mL/min (ref >60)
GFR calc non Af Amer: >60 mL/min (ref >60)
Glucose, Bld: 231 mg/dL — ABNORMAL HIGH (ref 70–99)
Potassium: 3.8 mmol/L (ref 3.5–5.1)
Sodium: 137 mmol/L (ref 135–145)
Total Protein: 6 g/dL — ABNORMAL LOW (ref 6.5–8.1)

## 2018-11-20 LAB — PHOSPHORUS: PHOSPHORUS: 4.1 mg/dL (ref 2.5–4.6)

## 2018-11-20 LAB — MAGNESIUM: Magnesium: 1.6 mg/dL — ABNORMAL LOW (ref 1.7–2.4)

## 2018-11-20 MED ORDER — OXYCODONE-ACETAMINOPHEN 5-325 MG PO TABS
1.0000 | ORAL_TABLET | ORAL | Status: DC | PRN
  Administered 2018-11-20 – 2018-11-22 (×8): 2 via ORAL
  Filled 2018-11-20 (×9): qty 2

## 2018-11-20 MED ORDER — MAGNESIUM SULFATE 2 GM/50ML IV SOLN
2.0000 g | Freq: Once | INTRAVENOUS | Status: AC
  Administered 2018-11-20: 2 g via INTRAVENOUS
  Filled 2018-11-20: qty 50

## 2018-11-20 NOTE — Progress Notes (Signed)
CSW spoke with patient who reports she will go home with home health and is refusing SNF at this time. Patient reports niece has planned to stay with her for any needed assistance and she has neighbors who are willing to assist as well.   RNCM notified of patient's choice of home.   [place], [place] [phone]

## 2018-11-20 NOTE — Progress Notes (Signed)
Nutrition Follow-up  DOCUMENTATION CODES:   Obesity unspecified  INTERVENTION:    D/C Juven, patient is not taking.  Continue snacks TID between meals, adjust items sent per patient preference.  Continue MVI daily.  NUTRITION DIAGNOSIS:   Increased nutrient needs related to wound healing, post-op healing as evidenced by estimated needs.  Ongoing  GOAL:   Patient will meet greater than or equal to 90% of their needs  Progressing  MONITOR:   PO intake, Supplement acceptance, Labs, Skin  ASSESSMENT:   Pt with PMH of DM2, HTN, PAD s/p L AKA (2018), and R 4th toe amputation. Mid Jan 2020, Pt had injury to R 3rd toe, now admitted for grossly infected/necrotic 3rd R toe.   S/P revision of TMA 2/12. VAC in place.  Patient states she is not eating all of her meals. Per flow sheet documentation, patient is consuming 75-100% of meals. She is refusing the Juven, does not like the way it tastes. She is receiving snacks between meals and would like to receive a peanut butter sandwich instead of crackers, RD to adjust snack order.   Labs and medications reviewed. Plans for d/c home with home health when medically stable.  Diet Order:   Diet Order            Diet Carb Modified Fluid consistency: Thin; Room service appropriate? Yes  Diet effective now              EDUCATION NEEDS:   Education needs have been addressed  Skin:  Skin Assessment: Skin Integrity Issues: Skin Integrity Issues:: Wound VAC Wound Vac: to TMA site Diabetic Ulcer: s/p TMA revision 2/12  Last BM:  2/12  Height:   Ht Readings from Last 1 Encounters:  11/12/18 5\' 5"  (1.651 m)    Weight:   Wt Readings from Last 1 Encounters:  11/12/18 95.3 kg    Ideal Body Weight:  52.3 kg  BMI:  Body mass index is 34.95 kg/m.  Estimated Nutritional Needs:   Kcal:  1600-1800  Protein:  95-115 grams  Fluid:  >1.6L    Joaquin Courts, RD, LDN, CNSC Pager [phone] After Hours Pager [phone]

## 2018-11-20 NOTE — Plan of Care (Signed)
  Problem: Clinical Measurements: Goal: Ability to maintain clinical measurements within normal limits will improve Outcome: Progressing Goal: Will remain free from infection Outcome: Progressing Goal: Diagnostic test results will improve Outcome: Progressing Goal: Cardiovascular complication will be avoided Outcome: Progressing   Problem: Activity: Goal: Risk for activity intolerance will decrease Outcome: Progressing   Problem: Elimination: Goal: Will not experience complications related to bowel motility Outcome: Progressing Goal: Will not experience complications related to urinary retention Outcome: Progressing   Problem: Pain Managment: Goal: General experience of comfort will improve Outcome: Progressing   Problem: Safety: Goal: Ability to remain free from injury will improve Outcome: Progressing   Problem: Skin Integrity: Goal: Risk for impaired skin integrity will decrease Outcome: Progressing   Problem: Education: Goal: Knowledge of the prescribed therapeutic regimen will improve Outcome: Progressing Goal: Ability to verbalize activity precautions or restrictions will improve Outcome: Progressing Goal: Understanding of discharge needs will improve Outcome: Progressing   Problem: Activity: Goal: Ability to perform//tolerate increased activity and mobilize with assistive devices will improve Outcome: Progressing   Problem: Clinical Measurements: Goal: Postoperative complications will be avoided or minimized Outcome: Progressing   Problem: Self-Care: Goal: Ability to meet self-care needs will improve Outcome: Progressing   Problem: Self-Concept: Goal: Ability to maintain and perform role responsibilities to the fullest extent possible will improve Outcome: Progressing   Problem: Pain Management: Goal: Pain level will decrease with appropriate interventions Outcome: Progressing   Problem: Skin Integrity: Goal: Demonstration of wound healing without  infection will improve Outcome: Progressing

## 2018-11-20 NOTE — Progress Notes (Addendum)
PROGRESS NOTE    Danielle Buchanan  PVV:[id number] DOB: 02-29-1956 DOA: 11/11/2018 PCP: [name], MD  Brief Narrative:  Danielle Buchanan is a 63 y.o. year old female with medical history significant for buy not limited to DM2, HTN, PAD s/p L AKA in 2018, R 4th toe amputation andwho presented on2/4/2020with concern for grossly infected/necrotic third right toe after podiatrist outpatient visitand was found to havenecrotic third right toe.  Outpatient XR documented as bony destruction suggestive of osteomyelitisat the middle phalanx of the right third toe. No gas in soft tissues. She underwent Transmetatarsal amputation on 11/12/2018 (Amputation of toes 2-5) with revision today (removal of big toe) by Dr. Myra Gianotti. Post-operatively she is having some pain so pain medications will be adjusted. Vascular to change Wound VAC tomorrow.    Assessment & Plan:   Principal Problem:   Osteomyelitis of third toe of right foot (HCC) Active Problems:   HTN (hypertension)   Depression   PAD (peripheral artery disease) (HCC)   Amputation of left lower extremity above knee upon examination (HCC)   Benign essential HTN   Diabetes mellitus type 2 in obese [facility])   Atherosclerosis of native arteries of the extremities with gangrene (HCC)  Sepsis secondary to necrotic right third toe/osteomyelitis from Serratia. S/p Transmetatarsal amputation with Wound VAC placement on 2/5/20202 with Revision of TMA 11/19/2018 -Sepsis resolved.  -Underwent revision today -Continue IV Ceftriaxone per ID recs, will have follow up arranged. Per Dr. Daiva Eves plan is treating her aggressively for 6 weeks of IV Ceftriaxone as margins weren't entirely clear the first time she underwent Amputation -PICC line placed. PT recommends SNF, patient is not interested.  -Will likely go home with home health PT once medically stable.   -C/w Pain control with Acetaminophen 650 mg q4hprn, Oxycodone-Acetaminophen 1-2 tab po q6hprn for  Moderate,Severe Pain but have increased the frequency to q4hprn -C/w IV Fentanyl 12.5 mcg q2hprn for Uncontrolled Pain not amenable to po  -Will add bowel regimen with Senna-Docusate 1 tab po BID and Miralax 17 grams po Dailyprn for Moderate Constipation  -WBC went from 9.6 -> 11.2 but likely reactive from Surgical Intervention  -Per vascular they plan to change the wound VAC tomorrow -Social work spoke with the patient and patient refusing skilled nursing facility at this time and will go home with home health services  Severe stenosis of right peroneal artery, status post balloon angioplasty on 2/6.   -No residual stenosis.  -Continue home Aspirin 81 mg po daily and Clopidogrel 75 mg po Daily added after procedure  Acute on Chronic Normocytic Anemia -Stable.  -Acute exacerbation during stay due to postoperative blood loss anemia.   -Suspect patient has chronic anemia as has had low hemoglobin of 08/2018.   -Prior to admission hemoglobin was 13.3, suspect this was hemoconcentrated?.   -Hb/Hct is now  Down from 11.3/35.3 and is now 10.3/33.3 -No active bleeding currently but will continue to Monitor for S/Sx of Bleeding -Repeat CBC in AM    Type 2 Diabetes, poorly controlled.   -HemoglobinA1c 10.9% (11/12/2018).   -On a significant amount of insulin at home (190 units total daily).   -Only on lantus 53 U here and may need to increase  -Held home Lantus on 2/12 prior to procedure but will resume now  -Given significant postprandial hyperglycemia added NovoLog 3 units 3 times daily.  -C/w Moderate Novolog SSI AC -CBG's ranging from 134-254  Hypertension -Stable. BP this AM was 134/57 -Continue home Losartan 100  mg po Daily -Continue with IV Hydralazine 5 mg q20 minprn for High Blood Pressure x2 doses  Hyperlipidemia -Stable. -C/w Home Pravastatin 40 mg po Daily   Depression and Anxiety; Insomnia  -Stable  -Continue home Sertraline 100 mg po Daily and Amitriptyline 25 mg po  qHS -C/w Lorazepam 0.5 mg po BIDprn Anxiety -C/w Trazodone 50 mg po qHS  Tobacco Abuse/Smoker -Smoking Cessation Counseling given; Stopped Smoking a Year ago -C/w Nicotine Polacrilex gum 2 mg po Dailyprn Smoking Cessation   Hypomagnesemia -Patient's Mag Level this AM was 1.6 -Replete with IV Mag Sulfate 2 grams -Continue to Monitor and Replete as Necessary -Repeat Mag Level in AM  Obesity -Estimated body mass index is 34.95 kg/m as calculated from the following:   Height as of this encounter: 5\' 5"  (1.651 m).   Weight as of this encounter: 95.3 kg. -Weight Loss and Dietary Counseling given   DVT prophylaxis: SCDs Code Status: FULL CODE Family Communication: No family present at bedside  Disposition Plan: Home Health with PT/OT in the next few days pending Vascular Clearance  Consultants:   Vascular Surgery   Infectious Diseases   WOC Nurse   Procedures:  Procedure Performed on 11/12/2018 by Dr. Myra GianottiBrabham             1.  Ultrasound-guided access, left femoral artery             2.  Abdominal aortogram             3.  Right lower extremity runoff             4.  Angioplasty, right tibioperoneal trunk and peroneal artery             5.  Closure device ([employer])             6.  Transmetatarsal amputation, toes 2 through 5             7.  Placement of transmetatarsal amputation site wound VAC  Procedure Performed on 11/19/2018 by Dr. Myra GianottiBrabham   #1: Re-do reght TMA amputation                         #2: Placement of right forefoot wound VAC   Antimicrobials:  Anti-infectives (From admission, onward)   Start     Dose/Rate Route Frequency Ordered Stop   11/19/18 0600  cefUROXime (ZINACEF) 1.5 g in sodium chloride 0.9 % 100 mL IVPB     1.5 g 200 mL/hr over 30 Minutes Intravenous On call to O.R. 11/18/18 1210 11/19/18 1715   11/15/18 1000  cefTRIAXone (ROCEPHIN) 2 g in sodium chloride 0.9 % 100 mL IVPB     2 g 200 mL/hr over 30 Minutes Intravenous Every 24 hours 11/14/18 1110      11/12/18 0800  cefTRIAXone (ROCEPHIN) 2 g in sodium chloride 0.9 % 100 mL IVPB  Status:  Discontinued     2 g 200 mL/hr over 30 Minutes Intravenous Every 24 hours 11/12/18 0243 11/14/18 0932   11/12/18 0600  metroNIDAZOLE (FLAGYL) tablet 500 mg  Status:  Discontinued     500 mg Oral Every 8 hours 11/12/18 0243 11/14/18 0932   11/11/18 2145  vancomycin (VANCOCIN) IVPB 1000 mg/200 mL premix     1,000 mg 200 mL/hr over 60 Minutes Intravenous  Once 11/11/18 2132 11/11/18 2333   11/11/18 2145  piperacillin-tazobactam (ZOSYN) IVPB 3.375 g     3.375 g 100 mL/hr over 30  Minutes Intravenous  Once 11/11/18 2132 11/11/18 2333     Subjective: Seen and examined at bedside and was complaining some pain in her transmetatarsal amputation site.  No nausea or vomiting.  No lightheadedness or dizziness but was a little tearful pain.  Refusing skilled nursing facility.  [facility] are planning to change the wound VAC tomorrow.  Objective: Vitals:   11/19/18 2015 11/20/18 0026 11/20/18 0445 11/20/18 0833  BP: (!) 141/72 136/63 131/65 (!) 134/57  Pulse: 66 63 63 (!) 59  Resp: 20 20 20    Temp: 98.2 F (36.8 C) 98.2 F (36.8 C) (!) 97.4 F (36.3 C) 98.1 F (36.7 C)  TempSrc: Oral Oral Oral Oral  SpO2: 95% 94% 96% 98%  Weight:      Height:        Intake/Output Summary (Last 24 hours) at 11/20/2018 0845 Last data filed at 11/20/2018 0630 Gross per 24 hour  Intake 500 ml  Output 145 ml  Net 355 ml   Filed Weights   11/12/18 1357  Weight: 95.3 kg   Examination: Physical Exam:  Constitutional: Well-nourished, well-developed obese Caucasian female currently no acute distress appears calm but slightly uncomfortable and is tearful due to pain Eyes: His lids and conjunctive are normal.  Sclera anicteric ENMT: External ears and nose appear normal.  Grossly normal hearing Neck: Appears supple no JVD Respiratory: Diminished auscultation bilaterally no appreciable wheezing, rales, Or patient not tachypneic  wheezing excess muscle breathe Cardiovascular: Regular rate and rhythm.  No appreciable murmurs, rubs or gallops.  No lower extremity edema noted Abdomen: Soft, nontender, distended secondary body habitus.  Bowel sounds present GU: Deferred Musculoskeletal: Has a right transmetatarsal amputation connected to a wound VAC and a left AKA noted Skin: No rashes or lesions on limited no skin evaluation.  Has no induration.  Skin is warm dry. Neurologic: Nerves II through XII grossly intact no appreciable focal deficits.  Romberg sign cerebellar reflexes were not assessed Psychiatric: Good insight.  Patient is awake and alert and oriented x3.  Has a slightly tearful mood and a flat affect  Data Reviewed: I have personally reviewed following labs and imaging studies  CBC: Recent Labs  Lab 11/16/18 0324 11/17/18 0928 11/18/18 0358 11/19/18 0444 11/20/18 0414  WBC 10.2 9.8 9.8 9.6 11.2*  NEUTROABS  --   --   --   --  7.5  HGB 12.1 11.5* 11.4* 11.3* 10.3*  HCT 37.7 36.4 35.3* 35.3* 33.3*  MCV 86.3 87.1 88.0 88.3 88.6  PLT 308 307 328 321 308   Basic Metabolic Panel: Recent Labs  Lab 11/17/18 0928 11/18/18 0358 11/19/18 0444 11/20/18 0414  NA 136 137 138 137  K 3.8 3.5 3.7 3.8  CL 102 100 104 101  CO2 25 24 28 25   GLUCOSE 263* 210* 168* 231*  BUN 9 8 11 14   CREATININE 0.73 0.68 0.72 0.78  CALCIUM 8.7* 8.9 8.9 8.4*  MG  --   --   --  1.6*  PHOS  --   --   --  4.1   GFR: Estimated Creatinine Clearance: 83.2 mL/min (by C-G formula based on SCr of 0.78 mg/dL). Liver Function Tests: Recent Labs  Lab 11/20/18 0414  AST 12*  ALT 15  ALKPHOS 56  BILITOT 0.4  PROT 6.0*  ALBUMIN 2.6*   No results for input(s): LIPASE, AMYLASE in the last 168 hours. No results for input(s): AMMONIA in the last 168 hours. Coagulation Profile: Recent Labs  Lab 11/19/18 0444  INR 1.08   Cardiac Enzymes: No results for input(s): CKTOTAL, CKMB, CKMBINDEX, TROPONINI in the last 168 hours. BNP  (last 3 results) No results for input(s): PROBNP in the last 8760 hours. HbA1C: No results for input(s): HGBA1C in the last 72 hours. CBG: Recent Labs  Lab 11/19/18 1143 11/19/18 1541 11/19/18 2125 11/20/18 0622 11/20/18 0803  GLUCAP 250* 379* 254* 244* 219*   Lipid Profile: No results for input(s): CHOL, HDL, LDLCALC, TRIG, CHOLHDL, LDLDIRECT in the last 72 hours. Thyroid Function Tests: No results for input(s): TSH, T4TOTAL, FREET4, T3FREE, THYROIDAB in the last 72 hours. Anemia Panel: No results for input(s): VITAMINB12, FOLATE, FERRITIN, TIBC, IRON, RETICCTPCT in the last 72 hours. Sepsis Labs: No results for input(s): PROCALCITON, LATICACIDVEN in the last 168 hours.  Recent Results (from the past 240 hour(s))  Blood Culture (routine x 2)     Status: None   Collection Time: 11/11/18  9:49 PM  Result Value Ref Range Status   Specimen Description BLOOD RIGHT ANTECUBITAL  Final   Special Requests   Final    BOTTLES DRAWN AEROBIC AND ANAEROBIC Blood Culture results may not be optimal due to an inadequate volume of blood received in culture bottles   Culture   Final    NO GROWTH 5 DAYS Performed at [facility], [address] [place], [place] [address]    Report Status 11/16/2018 FINAL  Final  Blood Culture (routine x 2)     Status: None   Collection Time: 11/11/18 10:06 PM  Result Value Ref Range Status   Specimen Description BLOOD RIGHT HAND  Final   Special Requests   Final    BOTTLES DRAWN AEROBIC AND ANAEROBIC Blood Culture adequate volume   Culture   Final    NO GROWTH 5 DAYS Performed at [facility], [address] [place], [place] [address]    Report Status 11/16/2018 FINAL  Final  MRSA PCR Screening     Status: None   Collection Time: 11/12/18  4:26 AM  Result Value Ref Range Status   MRSA by PCR NEGATIVE NEGATIVE Final    Comment:        The GeneXpert MRSA Assay (FDA approved for NASAL specimens only), is one component of a comprehensive  MRSA colonization surveillance program. It is not intended to diagnose MRSA infection nor to guide or monitor treatment for MRSA infections. Performed at [facility], [address] [place], [place] [address]   Aerobic/Anaerobic Culture (surgical/deep wound)     Status: None   Collection Time: 11/12/18  4:35 PM  Result Value Ref Range Status   Specimen Description FOOT  Final   Special Requests RIGHT  Final   Gram Stain   Final    RARE WBC PRESENT,BOTH PMN AND MONONUCLEAR FEW GRAM POSITIVE COCCI RARE GRAM VARIABLE ROD    Culture   Final    MODERATE SERRATIA MARCESCENS NO ANAEROBES ISOLATED Performed at [facility], [address] [place], [place] [address]    Report Status 11/18/2018 FINAL  Final   Organism ID, Bacteria SERRATIA MARCESCENS  Final      Susceptibility   Serratia marcescens - MIC*    CEFAZOLIN >=64 RESISTANT Resistant     CEFEPIME <=1 SENSITIVE Sensitive     CEFTAZIDIME <=1 SENSITIVE Sensitive     CEFTRIAXONE <=1 SENSITIVE Sensitive     CIPROFLOXACIN <=0.25 SENSITIVE Sensitive     GENTAMICIN <=1 SENSITIVE Sensitive  TRIMETH/SULFA <=20 SENSITIVE Sensitive     * MODERATE SERRATIA MARCESCENS    Radiology Studies: No results found.  Scheduled Meds: . amitriptyline  25 mg Oral QHS  . aspirin EC  81 mg Oral Daily  . clopidogrel  75 mg Oral Q breakfast  . insulin aspart  0-15 Units Subcutaneous TID WC  . insulin aspart  3 Units Subcutaneous TID WC  . insulin glargine  53 Units Subcutaneous QHS  . losartan  100 mg Oral Daily  . multivitamin with minerals  1 tablet Per Tube Daily  . nutrition supplement (JUVEN)  1 packet Oral BID BM  . pravastatin  40 mg Oral Daily  . senna-docusate  1 tablet Oral BID  . sertraline  100 mg Oral Daily  . sodium chloride flush  3 mL Intravenous Q12H  . traZODone  50 mg Oral QHS   Continuous Infusions: . cefTRIAXone (ROCEPHIN)  IV 2 g (11/18/18 0825)  . lactated ringers Stopped (11/15/18 1808)  .  magnesium sulfate 1 - 4 g bolus IVPB      LOS: 8 days   Merlene Laughter, DO [facility] PAGER is on AMION  If 7PM-7AM, please contact night-coverage [email or web address] Password [facility] 11/20/2018, 8:45 AM

## 2018-11-20 NOTE — Progress Notes (Addendum)
  Progress Note    11/20/2018 8:12 AM 1 Day Post-Op  Subjective:  Sleeping-I did not wake her  afebrile  Vitals:   11/20/18 0026 11/20/18 0445  BP: 136/63 131/65  Pulse: 63 63  Resp: 20 20  Temp: 98.2 F (36.8 C) (!) 97.4 F (36.3 C)  SpO2: 94% 96%    Physical Exam: General:  No distress; sleeping soundly Extremities:  TMA site with vac dressing in place with good seal   CBC    Component Value Date/Time   WBC 11.2 (H) 11/20/2018 0414   RBC 3.76 (L) 11/20/2018 0414   HGB 10.3 (L) 11/20/2018 0414   HCT 33.3 (L) 11/20/2018 0414   PLT 308 11/20/2018 0414   MCV 88.6 11/20/2018 0414   MCH 27.4 11/20/2018 0414   MCHC 30.9 11/20/2018 0414   RDW 13.2 11/20/2018 0414   LYMPHSABS 2.2 11/20/2018 0414   MONOABS 0.5 11/20/2018 0414   EOSABS 0.3 11/20/2018 0414   BASOSABS 0.1 11/20/2018 0414    BMET    Component Value Date/Time   NA 137 11/20/2018 0414   K 3.8 11/20/2018 0414   CL 101 11/20/2018 0414   CO2 25 11/20/2018 0414   GLUCOSE 231 (H) 11/20/2018 0414   BUN 14 11/20/2018 0414   CREATININE 0.78 11/20/2018 0414   CALCIUM 8.4 (L) 11/20/2018 0414   GFRNONAA >60 11/20/2018 0414   GFRAA >60 11/20/2018 0414    INR    Component Value Date/Time   INR 1.08 11/19/2018 0444     Intake/Output Summary (Last 24 hours) at 11/20/2018 9476 Last data filed at 11/20/2018 0630 Gross per 24 hour  Intake 500 ml  Output 145 ml  Net 355 ml     Assessment:  62 y.o. female is s/p:  Procedure:   #1: Re-do reght TMA amputation                         #2: Placement of right forefoot wound VAC  1 Day Post-Op  Plan: -pt vac with good seal-will remove this tomorrow -length of time for abx per Dr. Vista Lawman, PA-C [facility] [phone] 11/20/2018 8:12 AM  C/o pain in her TAM site.  Otherwise OK.  Pan to change VAC tomorrow.  Danielle Buchanan

## 2018-11-21 LAB — CBC WITH DIFFERENTIAL/PLATELET
ABS IMMATURE GRANULOCYTES: 0.46 10*3/uL — AB (ref 0.00–0.07)
BASOS PCT: 1 %
Basophils Absolute: 0.1 10*3/uL (ref 0.0–0.1)
Eosinophils Absolute: 0.3 10*3/uL (ref 0.0–0.5)
Eosinophils Relative: 3 %
HCT: 34.6 % — ABNORMAL LOW (ref 36.0–46.0)
Hemoglobin: 11 g/dL — ABNORMAL LOW (ref 12.0–15.0)
Immature Granulocytes: 5 %
Lymphocytes Relative: 24 %
Lymphs Abs: 2.1 10*3/uL (ref 0.7–4.0)
MCH: 28.2 pg (ref 26.0–34.0)
MCHC: 31.8 g/dL (ref 30.0–36.0)
MCV: 88.7 fL (ref 80.0–100.0)
Monocytes Absolute: 0.4 10*3/uL (ref 0.1–1.0)
Monocytes Relative: 4 %
Neutro Abs: 5.5 10*3/uL (ref 1.7–7.7)
Neutrophils Relative %: 63 %
PLATELETS: 325 10*3/uL (ref 150–400)
RBC: 3.9 MIL/uL (ref 3.87–5.11)
RDW: 13.2 % (ref 11.5–15.5)
WBC: 8.8 10*3/uL (ref 4.0–10.5)
nRBC: 0 % (ref 0.0–0.2)

## 2018-11-21 LAB — COMPREHENSIVE METABOLIC PANEL
ALT: 16 U/L (ref 0–44)
AST: 14 U/L — ABNORMAL LOW (ref 15–41)
Albumin: 2.8 g/dL — ABNORMAL LOW (ref 3.5–5.0)
Alkaline Phosphatase: 54 U/L (ref 38–126)
Anion gap: 9 (ref 5–15)
BUN: 11 mg/dL (ref 8–23)
CHLORIDE: 102 mmol/L (ref 98–111)
CO2: 26 mmol/L (ref 22–32)
Calcium: 8.9 mg/dL (ref 8.9–10.3)
Creatinine, Ser: 0.78 mg/dL (ref 0.44–1.00)
GFR calc Af Amer: >60 mL/min (ref >60)
GFR calc non Af Amer: >60 mL/min (ref >60)
Glucose, Bld: 201 mg/dL — ABNORMAL HIGH (ref 70–99)
Potassium: 4 mmol/L (ref 3.5–5.1)
SODIUM: 137 mmol/L (ref 135–145)
Total Bilirubin: 0.4 mg/dL (ref 0.3–1.2)
Total Protein: 6.2 g/dL — ABNORMAL LOW (ref 6.5–8.1)

## 2018-11-21 LAB — PHOSPHORUS: Phosphorus: 4.1 mg/dL (ref 2.5–4.6)

## 2018-11-21 LAB — GLUCOSE, CAPILLARY
GLUCOSE-CAPILLARY: 134 mg/dL — AB (ref 70–99)
Glucose-Capillary: 206 mg/dL — ABNORMAL HIGH (ref 70–99)
Glucose-Capillary: 156 mg/dL — ABNORMAL HIGH (ref 70–99)
Glucose-Capillary: 211 mg/dL — ABNORMAL HIGH (ref 70–99)
Glucose-Capillary: 159 mg/dL — ABNORMAL HIGH (ref 70–99)

## 2018-11-21 LAB — MAGNESIUM: Magnesium: 1.6 mg/dL — ABNORMAL LOW (ref 1.7–2.4)

## 2018-11-21 MED ORDER — FENTANYL CITRATE (PF) 100 MCG/2ML IJ SOLN
25.0000 ug | INTRAMUSCULAR | Status: DC | PRN
  Administered 2018-11-21 (×3): 25 ug via INTRAVENOUS
  Filled 2018-11-21 (×3): qty 2

## 2018-11-21 MED ORDER — MAGNESIUM SULFATE 2 GM/50ML IV SOLN
2.0000 g | Freq: Once | INTRAVENOUS | Status: AC
  Administered 2018-11-21: 2 g via INTRAVENOUS
  Filled 2018-11-21: qty 50

## 2018-11-21 MED ORDER — OSELTAMIVIR PHOSPHATE 75 MG PO CAPS
75.0000 mg | ORAL_CAPSULE | Freq: Every day | ORAL | Status: DC
  Administered 2018-11-21 – 2018-11-22 (×2): 75 mg via ORAL
  Filled 2018-11-21 (×2): qty 1

## 2018-11-21 NOTE — Care Management Important Message (Signed)
Important Message  Patient Details  Name: Danielle Buchanan MRN: [id number] Date of Birth: 1955/12/02   Medicare Important Message Given:  Yes    Dorena Bodo 11/21/2018, 4:24 PM

## 2018-11-21 NOTE — Progress Notes (Signed)
PROGRESS NOTE    Danielle ContesJanice M Jobin  ZOX:[id number] DOB: Jul 05, 1956 DOA: 11/11/2018 PCP: Shirlean MylarWebb, Carol, MD  Brief Narrative:  Danielle Buchanan is a 63 y.o. year old female with medical history significant for buy not limited to DM2, HTN, PAD s/p L AKA in 2018, R 4th toe amputation andwho presented on2/4/2020with concern for grossly infected/necrotic third right toe after podiatrist outpatient visitand was found to havenecrotic third right toe.  Outpatient XR documented as bony destruction suggestive of osteomyelitisat the middle phalanx of the right third toe. No gas in soft tissues. She underwent Transmetatarsal amputation on 11/12/2018 (Amputation of toes 2-5) with revision today (removal of big toe) by Dr. Myra GianottiBrabham. Post-operatively she is having some pain so pain medications will be adjusted. Vascular removed Wound VAC and are starting Dry Dressing changes today.    Assessment & Plan:   Principal Problem:   Osteomyelitis of third toe of right foot (HCC) Active Problems:   HTN (hypertension)   Depression   PAD (peripheral artery disease) (HCC)   Amputation of left lower extremity above knee upon examination (HCC)   Benign essential HTN   Diabetes mellitus type 2 in obese [facility](HCC)   Atherosclerosis of native arteries of the extremities with gangrene (HCC)  Sepsis secondary to necrotic right third toe/osteomyelitis from Serratia. S/p Transmetatarsal amputation with Wound VAC placement on 2/5/20202 with Revision of TMA 11/19/2018 -Sepsis resolved.  -Underwent revision on 11/19/2018 -Continue IV Ceftriaxone per ID recs, will have follow up arranged. Per Dr. Daiva EvesVan Dam plan is treating her aggressively for 6 weeks of IV Ceftriaxone as margins weren't entirely clear the first time she underwent Amputation -PICC line placed. PT recommends SNF, patient is not interested.  -Will likely go home with home health PT once medically stable and cleared from a Vascular Perspective. Vascular has no objections to  D/C but will need to discuss again about going to SNF as patient lives alone and ? How well she will get around at home -Will get PT/OT to evaluate and Treat to [facility] -C/w Pain control with Acetaminophen 650 mg q4hprn, Oxycodone-Acetaminophen 1-2 tab po q6hprn for Moderate,Severe Pain but have increased the frequency to q4hprn -Increased IV Fentanyl 12.5 mcg to 25 mcg q2hprn for Uncontrolled Pain not amenable to po  -Will add bowel regimen with Senna-Docusate 1 tab po BID and Miralax 17 grams po Dailyprn for Moderate Constipation  -WBC went from 9.6 -> 11.2 but likely reactive from Surgical Intervention and is now back down to 8.8 -Per vascular they removed the wound VAC today and are starting Dry Dressing Changes.  -Social work spoke with the patient and patient refusing skilled nursing facility at this time and will go home with home health services likely in AM if pain is better controlled   Severe stenosis of right peroneal artery, status post balloon angioplasty on 2/6.   -No residual stenosis.  -Continue home Aspirin 81 mg po daily and Clopidogrel 75 mg po Daily added after procedure  Acute on Chronic Normocytic Anemia -Stable.  -Acute exacerbation during stay due to postoperative blood loss anemia.   -Suspect patient has chronic anemia as has had low hemoglobin of 08/2018.   -Prior to admission hemoglobin was 13.3, suspect this was hemoconcentrated?.   -Hb/Hct is now  Down from 11.3/35.3 and is now 11.0/34.6 -No active bleeding currently but will continue to Monitor for S/Sx of Bleeding -Repeat CBC in AM    Type 2 Diabetes, poorly controlled.   -HemoglobinA1c 10.9% (  11/12/2018).   -On a significant amount of insulin at home (190 units total daily).   -Only on lantus 53 U here and may need to increase  -Held home Lantus on 2/12 prior to procedure but will resume now  -Given significant postprandial hyperglycemia added NovoLog 3 units 3 times daily.  -C/w  Moderate Novolog SSI AC -CBG's ranging from 134-220  Hypertension -Stable. BP this AM was 154/75 -Continue home Losartan 100 mg po Daily -Continue with IV Hydralazine 5 mg q20 minprn for High Blood Pressure x2 doses  Hyperlipidemia -Stable. -C/w Home Pravastatin 40 mg po Daily   Depression and Anxiety; Insomnia  -Stable  -Continue home Sertraline 100 mg po Daily and Amitriptyline 25 mg po qHS -C/w Lorazepam 0.5 mg po BIDprn Anxiety -C/w Trazodone 50 mg po qHS  Tobacco Abuse/Smoker -Smoking Cessation Counseling given; Stopped Smoking a Year ago -C/w Nicotine Polacrilex gum 2 mg po Dailyprn Smoking Cessation   Hypomagnesemia -Patient's Mag Level this AM was 1.6 -Replete again with IV Mag Sulfate 2 grams -Continue to Monitor and Replete as Necessary -Repeat Mag Level in AM  Obesity -Estimated body mass index is 34.95 kg/m as calculated from the following:   Height as of this encounter: 5\' 5"  (1.651 m).   Weight as of this encounter: 95.3 kg. -Weight Loss and Dietary Counseling given   ? Flu Exposure -She is neighbor has a flu and this is likely transmitted to the patient's neighbor nosocomially -I discussed with Infectious Diseases Dr. Jerolyn Center who recommends placing the patient on Prophylactic Tamiflu 75 mg po Daily x7 days -Patient started developing flulike symptoms will test with influenza a and B via PCR.  DVT prophylaxis: SCDs Code Status: FULL CODE Family Communication: No family present at bedside  Disposition Plan: Home Health with PT/OT in AM as Vascular felt she does not need to be in the hospital for her foot and wound.  Consultants:   Vascular Surgery   Infectious Diseases   WOC Nurse   Procedures:  Procedure Performed on 11/12/2018 by Dr. Myra Gianotti             1.  Ultrasound-guided access, left femoral artery             2.  Abdominal aortogram             3.  Right lower extremity runoff             4.  Angioplasty, right tibioperoneal trunk  and peroneal artery             5.  Closure device (Mynx)             6.  Transmetatarsal amputation, toes 2 through 5             7.  Placement of transmetatarsal amputation site wound VAC  Procedure Performed on 11/19/2018 by Dr. Myra Gianotti   #1: Re-do reght TMA amputation                         #2: Placement of right forefoot wound VAC   Antimicrobials:  Anti-infectives (From admission, onward)   Start     Dose/Rate Route Frequency Ordered Stop   11/21/18 1000  oseltamivir (TAMIFLU) capsule 75 mg     75 mg Oral Daily 11/21/18 0832 11/28/18 0959   11/19/18 0600  cefUROXime (ZINACEF) 1.5 g in sodium chloride 0.9 % 100 mL IVPB     1.5 g 200 mL/hr over  30 Minutes Intravenous On call to O.R. 11/18/18 1210 11/19/18 1715   11/15/18 1000  cefTRIAXone (ROCEPHIN) 2 g in sodium chloride 0.9 % 100 mL IVPB     2 g 200 mL/hr over 30 Minutes Intravenous Every 24 hours 11/14/18 1110     11/12/18 0800  cefTRIAXone (ROCEPHIN) 2 g in sodium chloride 0.9 % 100 mL IVPB  Status:  Discontinued     2 g 200 mL/hr over 30 Minutes Intravenous Every 24 hours 11/12/18 0243 11/14/18 0932   11/12/18 0600  metroNIDAZOLE (FLAGYL) tablet 500 mg  Status:  Discontinued     500 mg Oral Every 8 hours 11/12/18 0243 11/14/18 0932   11/11/18 2145  vancomycin (VANCOCIN) IVPB 1000 mg/200 mL premix     1,000 mg 200 mL/hr over 60 Minutes Intravenous  Once 11/11/18 2132 11/11/18 2333   11/11/18 2145  piperacillin-tazobactam (ZOSYN) IVPB 3.375 g     3.375 g 100 mL/hr over 30 Minutes Intravenous  Once 11/11/18 2132 11/11/18 2333     Subjective: Seen and examined at bedside and was still complaining of significant pain. No nausea or vomiting but did not feel well.  No other concerns or complaints at this time.  Objective: Vitals:   11/20/18 1422 11/20/18 1545 11/20/18 2026 11/21/18 0604  BP: 134/83 (!) 148/77 (!) 154/71 (!) 154/75  Pulse: 61 66 65 64  Resp: 17  16 16   Temp:  98 F (36.7 C) 97.9 F (36.6 C) 97.8 F (36.6  C)  TempSrc:  Oral Oral Oral  SpO2: 97% 96% 95% 93%  Weight:      Height:        Intake/Output Summary (Last 24 hours) at 11/21/2018 1430 Last data filed at 11/20/2018 1800 Gross per 24 hour  Intake -  Output 850 ml  Net -850 ml   Filed Weights   11/12/18 1357  Weight: 95.3 kg   Examination: Physical Exam:  Constitutional: Well-nourished, well-developed obese Caucasian female currently lying in bed appears slightly uncomfortable and still complaining of significant pain in her transmetatarsal amputation site Eyes: Lids and conjunctive are normal.  Sclera anicteric ENMT: External ears and nose appear normal.  Grossly normal hearing Neck: Appears supple no JVD Respiratory: Diminished auscultation bilaterally with no appreciable wheezing, rales, rhonchi.  Patient is not tachypneic or using any accessory muscles to breathe Cardiovascular: Regular rate and rhythm.  No appreciable murmurs, rubs, gallops.  No lower extremity edema noted Abdomen:, Nontender, distended second body habitus.  Bowel sounds present GU: Deferred Musculoskeletal: Has a right transmetatarsal amputation with a wound VAC still in place this morning and has a left AKA noted. Skin: No appreciable rashes or lesions limited skin evaluation. Neurologic: Cranial nerves II through XII grossly intact no appreciable focal deficits.  Romberg sign and cerebellar reflexes not assessed Psychiatric: Depressed appearing mood and flat affect.  She is awake and alert and oriented x3.  Normal judgment and insight.  Data Reviewed: I have personally reviewed following labs and imaging studies  CBC: Recent Labs  Lab 11/17/18 0928 11/18/18 0358 11/19/18 0444 11/20/18 0414 11/21/18 0416  WBC 9.8 9.8 9.6 11.2* 8.8  NEUTROABS  --   --   --  7.5 5.5  HGB 11.5* 11.4* 11.3* 10.3* 11.0*  HCT 36.4 35.3* 35.3* 33.3* 34.6*  MCV 87.1 88.0 88.3 88.6 88.7  PLT 307 328 321 308 325   Basic Metabolic Panel: Recent Labs  Lab  11/17/18 0928 11/18/18 0358 11/19/18 0444 11/20/18 0414 11/21/18 0416  NA 136 137 138 137 137  K 3.8 3.5 3.7 3.8 4.0  CL 102 100 104 101 102  CO2 25 24 28 25 26   GLUCOSE 263* 210* 168* 231* 201*  BUN 9 8 11 14 11   CREATININE 0.73 0.68 0.72 0.78 0.78  CALCIUM 8.7* 8.9 8.9 8.4* 8.9  MG  --   --   --  1.6* 1.6*  PHOS  --   --   --  4.1 4.1   GFR: Estimated Creatinine Clearance: 83.2 mL/min (by C-G formula based on SCr of 0.78 mg/dL). Liver Function Tests: Recent Labs  Lab 11/20/18 0414 11/21/18 0416  AST 12* 14*  ALT 15 16  ALKPHOS 56 54  BILITOT 0.4 0.4  PROT 6.0* 6.2*  ALBUMIN 2.6* 2.8*   No results for input(s): LIPASE, AMYLASE in the last 168 hours. No results for input(s): AMMONIA in the last 168 hours. Coagulation Profile: Recent Labs  Lab 11/19/18 0444  INR 1.08   Cardiac Enzymes: No results for input(s): CKTOTAL, CKMB, CKMBINDEX, TROPONINI in the last 168 hours. BNP (last 3 results) No results for input(s): PROBNP in the last 8760 hours. HbA1C: No results for input(s): HGBA1C in the last 72 hours. CBG: Recent Labs  Lab 11/20/18 1543 11/20/18 2236 11/21/18 0559 11/21/18 0826 11/21/18 1140  GLUCAP 188* 220* 206* 156* 134*   Lipid Profile: No results for input(s): CHOL, HDL, LDLCALC, TRIG, CHOLHDL, LDLDIRECT in the last 72 hours. Thyroid Function Tests: No results for input(s): TSH, T4TOTAL, FREET4, T3FREE, THYROIDAB in the last 72 hours. Anemia Panel: No results for input(s): VITAMINB12, FOLATE, FERRITIN, TIBC, IRON, RETICCTPCT in the last 72 hours. Sepsis Labs: No results for input(s): PROCALCITON, LATICACIDVEN in the last 168 hours.  Recent Results (from the past 240 hour(s))  Blood Culture (routine x 2)     Status: None   Collection Time: 11/11/18  9:49 PM  Result Value Ref Range Status   Specimen Description BLOOD RIGHT ANTECUBITAL  Final   Special Requests   Final    BOTTLES DRAWN AEROBIC AND ANAEROBIC Blood Culture results may not be  optimal due to an inadequate volume of blood received in culture bottles   Culture   Final    NO GROWTH 5 DAYS Performed at [facility], [address], [place], [place] [address]    Report Status 11/16/2018 FINAL  Final  Blood Culture (routine x 2)     Status: None   Collection Time: 11/11/18 10:06 PM  Result Value Ref Range Status   Specimen Description BLOOD RIGHT HAND  Final   Special Requests   Final    BOTTLES DRAWN AEROBIC AND ANAEROBIC Blood Culture adequate volume   Culture   Final    NO GROWTH 5 DAYS Performed at [facility], [address], [place], [place] [address]    Report Status 11/16/2018 FINAL  Final  MRSA PCR Screening     Status: None   Collection Time: 11/12/18  4:26 AM  Result Value Ref Range Status   MRSA by PCR NEGATIVE NEGATIVE Final    Comment:        The GeneXpert MRSA Assay (FDA approved for NASAL specimens only), is one component of a comprehensive MRSA colonization surveillance program. It is not intended to diagnose MRSA infection nor to guide or monitor treatment for MRSA infections. Performed at [facility], [address] [place], [place] [address]   Aerobic/Anaerobic Culture (surgical/deep wound)  Status: None   Collection Time: 11/12/18  4:35 PM  Result Value Ref Range Status   Specimen Description FOOT  Final   Special Requests RIGHT  Final   Gram Stain   Final    RARE WBC PRESENT,BOTH PMN AND MONONUCLEAR FEW GRAM POSITIVE COCCI RARE GRAM VARIABLE ROD    Culture   Final    MODERATE SERRATIA MARCESCENS NO ANAEROBES ISOLATED Performed at [facility], [address] [place], [place] [address]    Report Status 11/18/2018 FINAL  Final   Organism ID, Bacteria SERRATIA MARCESCENS  Final      Susceptibility   Serratia marcescens - MIC*    CEFAZOLIN >=64 RESISTANT Resistant     CEFEPIME <=1 SENSITIVE Sensitive     CEFTAZIDIME <=1 SENSITIVE Sensitive     CEFTRIAXONE <=1 SENSITIVE Sensitive      CIPROFLOXACIN <=0.25 SENSITIVE Sensitive     GENTAMICIN <=1 SENSITIVE Sensitive     TRIMETH/SULFA <=20 SENSITIVE Sensitive     * MODERATE SERRATIA MARCESCENS    Radiology Studies: No results found.  Scheduled Meds: . amitriptyline  25 mg Oral QHS  . aspirin EC  81 mg Oral Daily  . clopidogrel  75 mg Oral Q breakfast  . insulin aspart  0-15 Units Subcutaneous TID WC  . insulin aspart  3 Units Subcutaneous TID WC  . insulin glargine  53 Units Subcutaneous QHS  . losartan  100 mg Oral Daily  . multivitamin with minerals  1 tablet Per Tube Daily  . oseltamivir  75 mg Oral Daily  . pravastatin  40 mg Oral Daily  . senna-docusate  1 tablet Oral BID  . sertraline  100 mg Oral Daily  . sodium chloride flush  3 mL Intravenous Q12H  . traZODone  50 mg Oral QHS   Continuous Infusions: . cefTRIAXone (ROCEPHIN)  IV 2 g (11/21/18 1043)  . lactated ringers Stopped (11/15/18 1808)    LOS: 9 days   Merlene Laughtermair Latif , DO [facility] PAGER is on AMION  If 7PM-7AM, please contact night-coverage [email or web address] Password [email or web address] 11/21/2018, 2:30 PM

## 2018-11-21 NOTE — Progress Notes (Signed)
Physical Therapy Treatment Patient Details Name: Danielle Buchanan MRN: [id number] DOB: 1956-09-21 Today's Date: 11/21/2018    History of Present Illness 63 yo female with history of injury to R 3rd toe in January, wound worsened and she now received amputation of R 2nd/3rd/5th toes on 11/12/2018. PMH DM, HTN, L AKA     PT Comments    Patient seen for mobiltiy progression. Attempted sit to stand transfers X 2 trials however pt unable to lift buttocks from EOB. Pt will need to practice lateral scoot transfers to/from w/c as this will be the safest option. Pt encouraged to have family bring her personal w/c from home tomorrow. pt demonstrates poor safety awareness and will need 24 hour supervision/assistance at d/c. Continue to progress as tolerated.    Follow Up Recommendations  SNF     Equipment Recommendations  Other (comment)(defer to next venue)    Recommendations for Other Services       Precautions / Restrictions Precautions Precautions: Fall Restrictions Weight Bearing Restrictions: Yes RLE Weight Bearing: Weight bearing as tolerated Other Position/Activity Restrictions: WBAT through heel in darco shoe     Mobility  Bed Mobility Overal bed mobility: Needs Assistance Bed Mobility: Supine to Sit;Sit to Supine     Supine to sit: HOB elevated;Min guard Sit to supine: Min guard      Transfers Overall transfer level: Needs assistance Equipment used: Rolling walker (2 wheeled) Transfers: Sit to/from Stand Sit to Stand: From elevated surface;+2 physical assistance;Max assist         General transfer comment: attempted sit to stand X 2 trials with max A +2 but pt unable to lift buttocks from EOB due to pain in R LE with weight bearing; prosthesis and Darco shoe donned   Ambulation/Gait                 Stairs             Wheelchair Mobility    Modified Rankin (Stroke Patients Only)       Balance Overall balance assessment: Needs  assistance Sitting-balance support: Feet supported;Single extremity supported Sitting balance-Leahy Scale: Fair Sitting balance - Comments: pt requires assistance to maintain sitting balance EOB at times                                    Cognition Arousal/Alertness: Awake/alert Behavior During Therapy: WFL for tasks assessed/performed Overall Cognitive Status: No family/caregiver present to determine baseline cognitive functioning                                 General Comments: decreased safety awareness      Exercises      General Comments        Pertinent Vitals/Pain Pain Assessment: Faces Faces Pain Scale: Hurts even more Pain Location: R foot Pain Descriptors / Indicators: Guarding;Grimacing;Sore Pain Intervention(s): Limited activity within patient's tolerance;Monitored during session;Premedicated before session;Repositioned;Patient requesting pain meds-RN notified    Home Living                      Prior Function            PT Goals (current goals can now be found in the care plan section) Acute Rehab PT Goals Patient Stated Goal: go home  Progress towards PT goals: Progressing toward goals    Frequency  Min 3X/week      PT Plan Current plan remains appropriate    Co-evaluation              AM-PAC PT "6 Clicks" Mobility   Outcome Measure  Help needed turning from your back to your side while in a flat bed without using bedrails?: A Little Help needed moving from lying on your back to sitting on the side of a flat bed without using bedrails?: A Little Help needed moving to and from a bed to a chair (including a wheelchair)?: A Lot Help needed standing up from a chair using your arms (e.g., wheelchair or bedside chair)?: Total Help needed to walk in hospital room?: Total Help needed climbing 3-5 steps with a railing? : Total 6 Click Score: 11    End of Session Equipment Utilized During Treatment:  Gait belt;Other (comment)(L LE prosthesis and darco shoe on right foot) Activity Tolerance: Patient limited by pain Patient left: in bed;with call bell/phone within reach;with bed alarm set Nurse Communication: Mobility status PT Visit Diagnosis: Unsteadiness on feet (R26.81);Muscle weakness (generalized) (M62.81);Difficulty in walking, not elsewhere classified (R26.2)     Time: 1526-1600 PT Time Calculation (min) (ACUTE ONLY): 34 min  Charges:  $Gait Training: 23-37 mins                     Danielle Buchanan, PTA Acute Rehabilitation Services Pager: [phone] Office: [phone]     Danielle Buchanan 11/21/2018, 5:19 PM

## 2018-11-21 NOTE — Progress Notes (Signed)
    Subjective  - POD #2  Has some pain at TMA site   Physical Exam:  VAC removed.  TMA is healing.       Assessment/Plan:  POD #2  Discontinue VAC and start dry dressing changes  Danielle Buchanan 11/21/2018 10:30 AM --  Vitals:   11/20/18 2026 11/21/18 0604  BP: (!) 154/71 (!) 154/75  Pulse: 65 64  Resp: 16 16  Temp: 97.9 F (36.6 C) 97.8 F (36.6 C)  SpO2: 95% 93%    Intake/Output Summary (Last 24 hours) at 11/21/2018 1030 Last data filed at 11/20/2018 1800 Gross per 24 hour  Intake 360 ml  Output 850 ml  Net -490 ml     Laboratory CBC    Component Value Date/Time   WBC 8.8 11/21/2018 0416   HGB 11.0 (L) 11/21/2018 0416   HCT 34.6 (L) 11/21/2018 0416   PLT 325 11/21/2018 0416    BMET    Component Value Date/Time   NA 137 11/21/2018 0416   K 4.0 11/21/2018 0416   CL 102 11/21/2018 0416   CO2 26 11/21/2018 0416   GLUCOSE 201 (H) 11/21/2018 0416   BUN 11 11/21/2018 0416   CREATININE 0.78 11/21/2018 0416   CALCIUM 8.9 11/21/2018 0416   GFRNONAA >60 11/21/2018 0416   GFRAA >60 11/21/2018 0416    COAG Lab Results  Component Value Date   INR 1.08 11/19/2018   INR 1.07 11/12/2018   INR 1.12 08/30/2017   No results found for: PTT  Antibiotics Anti-infectives (From admission, onward)   Start     Dose/Rate Route Frequency Ordered Stop   11/21/18 1000  oseltamivir (TAMIFLU) capsule 75 mg     75 mg Oral Daily 11/21/18 0832 11/28/18 0959   11/19/18 0600  cefUROXime (ZINACEF) 1.5 g in sodium chloride 0.9 % 100 mL IVPB     1.5 g 200 mL/hr over 30 Minutes Intravenous On call to O.R. 11/18/18 1210 11/19/18 1715   11/15/18 1000  cefTRIAXone (ROCEPHIN) 2 g in sodium chloride 0.9 % 100 mL IVPB     2 g 200 mL/hr over 30 Minutes Intravenous Every 24 hours 11/14/18 1110     11/12/18 0800  cefTRIAXone (ROCEPHIN) 2 g in sodium chloride 0.9 % 100 mL IVPB  Status:  Discontinued     2 g 200 mL/hr over 30 Minutes Intravenous Every 24 hours 11/12/18 0243  11/14/18 0932   11/12/18 0600  metroNIDAZOLE (FLAGYL) tablet 500 mg  Status:  Discontinued     500 mg Oral Every 8 hours 11/12/18 0243 11/14/18 0932   11/11/18 2145  vancomycin (VANCOCIN) IVPB 1000 mg/200 mL premix     1,000 mg 200 mL/hr over 60 Minutes Intravenous  Once 11/11/18 2132 11/11/18 2333   11/11/18 2145  piperacillin-tazobactam (ZOSYN) IVPB 3.375 g     3.375 g 100 mL/hr over 30 Minutes Intravenous  Once 11/11/18 2132 11/11/18 2333       V. Charlena Cross, M.D. [facility] Office: [phone] Pager:  [phone]

## 2018-11-21 NOTE — Plan of Care (Signed)
  Problem: Clinical Measurements: Goal: Ability to maintain clinical measurements within normal limits will improve Outcome: Progressing Goal: Will remain free from infection Outcome: Progressing Goal: Diagnostic test results will improve Outcome: Progressing Goal: Cardiovascular complication will be avoided Outcome: Progressing   Problem: Activity: Goal: Risk for activity intolerance will decrease Outcome: Progressing   Problem: Elimination: Goal: Will not experience complications related to bowel motility Outcome: Progressing Goal: Will not experience complications related to urinary retention Outcome: Progressing   Problem: Pain Managment: Goal: General experience of comfort will improve Outcome: Progressing   Problem: Safety: Goal: Ability to remain free from injury will improve Outcome: Progressing   Problem: Skin Integrity: Goal: Risk for impaired skin integrity will decrease Outcome: Progressing   Problem: Education: Goal: Knowledge of the prescribed therapeutic regimen will improve Outcome: Progressing Goal: Ability to verbalize activity precautions or restrictions will improve Outcome: Progressing Goal: Understanding of discharge needs will improve Outcome: Progressing   Problem: Activity: Goal: Ability to perform//tolerate increased activity and mobilize with assistive devices will improve Outcome: Progressing   Problem: Clinical Measurements: Goal: Postoperative complications will be avoided or minimized Outcome: Progressing   Problem: Self-Care: Goal: Ability to meet self-care needs will improve Outcome: Progressing   Problem: Self-Concept: Goal: Ability to maintain and perform role responsibilities to the fullest extent possible will improve Outcome: Progressing   Problem: Pain Management: Goal: Pain level will decrease with appropriate interventions Outcome: Progressing   Problem: Skin Integrity: Goal: Demonstration of wound healing without  infection will improve Outcome: Progressing

## 2018-11-22 ENCOUNTER — Emergency Department (HOSPITAL_COMMUNITY)
Admission: EM | Admit: 2018-11-22 | Discharge: 2018-11-27 | Payer: Medicare HMO | Attending: Emergency Medicine | Admitting: Emergency Medicine

## 2018-11-22 ENCOUNTER — Encounter (HOSPITAL_COMMUNITY): Payer: Self-pay | Admitting: Emergency Medicine

## 2018-11-22 DIAGNOSIS — Z79899 Other long term (current) drug therapy: Secondary | ICD-10-CM | POA: Diagnosis not present

## 2018-11-22 DIAGNOSIS — T148XXA Other injury of unspecified body region, initial encounter: Secondary | ICD-10-CM

## 2018-11-22 DIAGNOSIS — Z87891 Personal history of nicotine dependence: Secondary | ICD-10-CM | POA: Insufficient documentation

## 2018-11-22 DIAGNOSIS — Z7902 Long term (current) use of antithrombotics/antiplatelets: Secondary | ICD-10-CM | POA: Diagnosis not present

## 2018-11-22 DIAGNOSIS — I10 Essential (primary) hypertension: Secondary | ICD-10-CM | POA: Diagnosis not present

## 2018-11-22 DIAGNOSIS — Z89421 Acquired absence of other right toe(s): Secondary | ICD-10-CM | POA: Diagnosis not present

## 2018-11-22 DIAGNOSIS — M868X7 Other osteomyelitis, ankle and foot: Secondary | ICD-10-CM

## 2018-11-22 DIAGNOSIS — A498 Other bacterial infections of unspecified site: Secondary | ICD-10-CM

## 2018-11-22 DIAGNOSIS — M96831 Postprocedural hemorrhage and hematoma of a musculoskeletal structure following other procedure: Secondary | ICD-10-CM | POA: Diagnosis not present

## 2018-11-22 DIAGNOSIS — E119 Type 2 diabetes mellitus without complications: Secondary | ICD-10-CM | POA: Diagnosis not present

## 2018-11-22 DIAGNOSIS — Z794 Long term (current) use of insulin: Secondary | ICD-10-CM | POA: Insufficient documentation

## 2018-11-22 DIAGNOSIS — Z7982 Long term (current) use of aspirin: Secondary | ICD-10-CM | POA: Diagnosis not present

## 2018-11-22 DIAGNOSIS — L7622 Postprocedural hemorrhage and hematoma of skin and subcutaneous tissue following other procedure: Secondary | ICD-10-CM | POA: Diagnosis not present

## 2018-11-22 LAB — COMPREHENSIVE METABOLIC PANEL
ALT: 15 U/L (ref 0–44)
AST: 13 U/L — ABNORMAL LOW (ref 15–41)
Albumin: 2.8 g/dL — ABNORMAL LOW (ref 3.5–5.0)
Alkaline Phosphatase: 55 U/L (ref 38–126)
Anion gap: 6 (ref 5–15)
BUN: 10 mg/dL (ref 8–23)
CO2: 27 mmol/L (ref 22–32)
Calcium: 8.6 mg/dL — ABNORMAL LOW (ref 8.9–10.3)
Chloride: 104 mmol/L (ref 98–111)
Creatinine, Ser: 0.66 mg/dL (ref 0.44–1.00)
GFR calc Af Amer: >60 mL/min (ref >60)
GFR calc non Af Amer: >60 mL/min (ref >60)
Glucose, Bld: 206 mg/dL — ABNORMAL HIGH (ref 70–99)
Potassium: 4 mmol/L (ref 3.5–5.1)
Sodium: 137 mmol/L (ref 135–145)
Total Bilirubin: 0.3 mg/dL (ref 0.3–1.2)
Total Protein: 5.9 g/dL — ABNORMAL LOW (ref 6.5–8.1)

## 2018-11-22 LAB — CBC WITH DIFFERENTIAL/PLATELET
Abs Immature Granulocytes: 0.38 10*3/uL — ABNORMAL HIGH (ref 0.00–0.07)
Basophils Absolute: 0.1 10*3/uL (ref 0.0–0.1)
Basophils Relative: 1 %
Eosinophils Absolute: 0.3 10*3/uL (ref 0.0–0.5)
Eosinophils Relative: 4 %
HCT: 35.4 % — ABNORMAL LOW (ref 36.0–46.0)
Hemoglobin: 11.1 g/dL — ABNORMAL LOW (ref 12.0–15.0)
Immature Granulocytes: 5 %
Lymphocytes Relative: 19 %
Lymphs Abs: 1.6 10*3/uL (ref 0.7–4.0)
MCH: 27.6 pg (ref 26.0–34.0)
MCHC: 31.4 g/dL (ref 30.0–36.0)
MCV: 88.1 fL (ref 80.0–100.0)
Monocytes Absolute: 0.4 10*3/uL (ref 0.1–1.0)
Monocytes Relative: 5 %
Neutro Abs: 5.5 10*3/uL (ref 1.7–7.7)
Neutrophils Relative %: 66 %
Platelets: 301 10*3/uL (ref 150–400)
RBC: 4.02 MIL/uL (ref 3.87–5.11)
RDW: 13.2 % (ref 11.5–15.5)
WBC: 8.1 10*3/uL (ref 4.0–10.5)
nRBC: 0 % (ref 0.0–0.2)

## 2018-11-22 LAB — PHOSPHORUS: Phosphorus: 4.4 mg/dL (ref 2.5–4.6)

## 2018-11-22 LAB — GLUCOSE, CAPILLARY
Glucose-Capillary: 191 mg/dL — ABNORMAL HIGH (ref 70–99)
Glucose-Capillary: 136 mg/dL — ABNORMAL HIGH (ref 70–99)
Glucose-Capillary: 294 mg/dL — ABNORMAL HIGH (ref 70–99)

## 2018-11-22 LAB — MAGNESIUM: Magnesium: 1.6 mg/dL — ABNORMAL LOW (ref 1.7–2.4)

## 2018-11-22 MED ORDER — OXYCODONE-ACETAMINOPHEN 5-325 MG PO TABS
1.0000 | ORAL_TABLET | Freq: Once | ORAL | Status: AC
  Administered 2018-11-22: 1 via ORAL
  Filled 2018-11-22: qty 1

## 2018-11-22 MED ORDER — LORAZEPAM 0.5 MG PO TABS: 0.5000 mg | ORAL_TABLET | Freq: Two times a day (BID) | ORAL | 0 refills | Status: DC | PRN

## 2018-11-22 MED ORDER — ASPIRIN 81 MG PO TBEC: 81.0000 mg | DELAYED_RELEASE_TABLET | Freq: Every day | ORAL | 0 refills | Status: DC

## 2018-11-22 MED ORDER — SULFAMETHOXAZOLE-TRIMETHOPRIM 800-160 MG PO TABS: 1.0000 | ORAL_TABLET | Freq: Two times a day (BID) | ORAL | 0 refills | Status: AC

## 2018-11-22 MED ORDER — OXYCODONE-ACETAMINOPHEN 5-325 MG PO TABS: 1.0000 | ORAL_TABLET | ORAL | 0 refills | Status: DC | PRN

## 2018-11-22 MED ORDER — POLYETHYLENE GLYCOL 3350 17 G PO PACK: 17.0000 g | PACK | Freq: Every day | ORAL | 0 refills | Status: DC | PRN

## 2018-11-22 MED ORDER — OSELTAMIVIR PHOSPHATE 75 MG PO CAPS: 75.0000 mg | ORAL_CAPSULE | Freq: Every day | ORAL | 0 refills | Status: AC

## 2018-11-22 MED ORDER — SENNOSIDES-DOCUSATE SODIUM 8.6-50 MG PO TABS: 1.0000 | ORAL_TABLET | Freq: Two times a day (BID) | ORAL | 0 refills | Status: DC

## 2018-11-22 MED ORDER — ADULT MULTIVITAMIN W/MINERALS CH: 1.0000 | ORAL_TABLET | Freq: Every day | ORAL | 0 refills | Status: AC

## 2018-11-22 MED ORDER — MAGNESIUM SULFATE 2 GM/50ML IV SOLN
2.0000 g | Freq: Once | INTRAVENOUS | Status: AC
  Administered 2018-11-22: 2 g via INTRAVENOUS
  Filled 2018-11-22: qty 50

## 2018-11-22 MED ORDER — TRANEXAMIC ACID 1000 MG/10ML IV SOLN
500.0000 mg | Freq: Once | INTRAVENOUS | Status: AC
  Administered 2018-11-22: 500 mg via TOPICAL
  Filled 2018-11-22: qty 10

## 2018-11-22 MED ORDER — CLOPIDOGREL BISULFATE 75 MG PO TABS: 75.0000 mg | ORAL_TABLET | Freq: Every day | ORAL | 0 refills | Status: DC

## 2018-11-22 NOTE — Progress Notes (Signed)
Physical Therapy Treatment Patient Details Name: Danielle Buchanan MRN: [id number] DOB: November 24, 1955 Today's Date: 11/22/2018    History of Present Illness 63 yo female with history of injury to R 3rd toe in January, wound worsened and she now received amputation of R 2nd/3rd/5th toes on 11/12/2018. PMH DM, HTN, L AKA     PT Comments    Pt is making slow progress towards goals. She progressed OOB to recliner chair after several attempts with RW and  max/mod A+2.  She expressed a desire to return, however continue to recommend pt d/c to SNF to maximize safety with mobility and functional independence.     Follow Up Recommendations  SNF     Equipment Recommendations  Other (comment)(defer to next venue)    Recommendations for Other Services       Precautions / Restrictions Precautions Precautions: Fall Restrictions Weight Bearing Restrictions: No RLE Weight Bearing: Weight bearing as tolerated Other Position/Activity Restrictions: WBAT through heel in darco shoe     Mobility  Bed Mobility Overal bed mobility: Needs Assistance Bed Mobility: Supine to Sit     Supine to sit: HOB elevated;Supervision     General bed mobility comments: Supervision for lines with bed mobilities. Pt moving well, no VC required.  Transfers Overall transfer level: Needs assistance Equipment used: Rolling walker (2 wheeled) Transfers: Sit to/from [employer] Sit to Stand: From elevated surface;+2 physical assistance;Max assist Stand pivot transfers: Mod assist;+2 physical assistance;+2 safety/equipment       General transfer comment: Sit<>stand attempted 5x. Able to clear bottom after 2 attempts, but unable to pivot until 5th attempt. VC for forward weight shift and postural control. Pt requiring encouragement to trust her LEs to support her.  Ambulation/Gait             General Gait Details: small pivot steps to recliner chair   Stairs             Wheelchair  Mobility    Modified Rankin (Stroke Patients Only)       Balance Overall balance assessment: Needs assistance Sitting-balance support: Feet supported;Single extremity supported Sitting balance-Leahy Scale: Fair Sitting balance - Comments: Pt able to don prosthesis while sitting EOB. Some posterior LOB noted.   Standing balance support: Bilateral upper extremity supported Standing balance-Leahy Scale: Poor Standing balance comment: Mod A for +2 transfer                            Cognition Arousal/Alertness: Awake/alert Behavior During Therapy: WFL for tasks assessed/performed;Anxious Overall Cognitive Status: No family/caregiver present to determine baseline cognitive functioning                                 General Comments: Pt fearful, but willing to participate with encouragement.      Exercises      General Comments        Pertinent Vitals/Pain Pain Assessment: 0-10 Faces Pain Scale: Hurts little more Pain Location: R foot Pain Descriptors / Indicators: Guarding;Grimacing;Sore Pain Intervention(s): Monitored during session;Limited activity within patient's tolerance;Repositioned    Home Living                      Prior Function            PT Goals (current goals can now be found in the care plan section) Acute Rehab PT Goals Patient  Stated Goal: go home  PT Goal Formulation: With patient Time For Goal Achievement: 11/28/18 Potential to Achieve Goals: Good Progress towards PT goals: Progressing toward goals    Frequency    Min 3X/week      PT Plan Current plan remains appropriate    Co-evaluation              AM-PAC PT "6 Clicks" Mobility   Outcome Measure  Help needed turning from your back to your side while in a flat bed without using bedrails?: A Little Help needed moving from lying on your back to sitting on the side of a flat bed without using bedrails?: A Little Help needed moving to and from  a bed to a chair (including a wheelchair)?: A Lot Help needed standing up from a chair using your arms (e.g., wheelchair or bedside chair)?: A Lot Help needed to walk in hospital room?: Total Help needed climbing 3-5 steps with a railing? : Total 6 Click Score: 12    End of Session Equipment Utilized During Treatment: Gait belt;Other (comment)(L LE prosthesis and darco shoe on right foot) Activity Tolerance: Patient tolerated treatment well Patient left: with call bell/phone within reach;in chair;with chair alarm set Nurse Communication: Mobility status;Need for lift equipment(Use stedy for return to bed) PT Visit Diagnosis: Unsteadiness on feet (R26.81);Muscle weakness (generalized) (M62.81);Difficulty in walking, not elsewhere classified (R26.2)     Time: 0102-7253 PT Time Calculation (min) (ACUTE ONLY): 29 min  Charges:  $Therapeutic Activity: 8-22 mins                     Danielle Buchanan, [place] Pager [phone] Acute Rehab   Danielle Buchanan 11/22/2018, 11:42 AM

## 2018-11-22 NOTE — Care Management (Signed)
[facility] aware of patient d/c on PO antibiotics.

## 2018-11-22 NOTE — Progress Notes (Addendum)
  Progress Note    11/22/2018 10:27 AM 3 Days Post-Op  Subjective:  No complaints; says she is going home.  afebrile  Vitals:   11/21/18 2136 11/22/18 0407  BP: (!) 157/73 125/74  Pulse: 66 68  Resp: 15 16  Temp: 98.2 F (36.8 C) 98.2 F (36.8 C)  SpO2: 97% 92%    Physical Exam: Incision:        CBC    Component Value Date/Time   WBC 8.1 11/22/2018 0452   RBC 4.02 11/22/2018 0452   HGB 11.1 (L) 11/22/2018 0452   HCT 35.4 (L) 11/22/2018 0452   PLT 301 11/22/2018 0452   MCV 88.1 11/22/2018 0452   MCH 27.6 11/22/2018 0452   MCHC 31.4 11/22/2018 0452   RDW 13.2 11/22/2018 0452   LYMPHSABS 1.6 11/22/2018 0452   MONOABS 0.4 11/22/2018 0452   EOSABS 0.3 11/22/2018 0452   BASOSABS 0.1 11/22/2018 0452    BMET    Component Value Date/Time   NA 137 11/22/2018 0452   K 4.0 11/22/2018 0452   CL 104 11/22/2018 0452   CO2 27 11/22/2018 0452   GLUCOSE 206 (H) 11/22/2018 0452   BUN 10 11/22/2018 0452   CREATININE 0.66 11/22/2018 0452   CALCIUM 8.6 (L) 11/22/2018 0452   GFRNONAA >60 11/22/2018 0452   GFRAA >60 11/22/2018 0452    INR    Component Value Date/Time   INR 1.08 11/19/2018 0444     Intake/Output Summary (Last 24 hours) at 11/22/2018 1027 Last data filed at 11/22/2018 0900 Gross per 24 hour  Intake 720 ml  Output 700 ml  Net 20 ml     Assessment:  63 y.o. female is s/p:  #1: Re-do reght TMA amputation #2: Placement of right forefoot wound VAC  3 Days Post-Op  Plan: -pt's incision looks good this morning.  Bloody drainage on bandage.  This was reinforced instead of changed.  If bandage saturates, please remove and replace with 4x4, ABD (if still some bloody drainage), kerlix and 4" ace (do not wrap too tight-mild compression).  Still mild bloody drainage from medial portion of incision.   -hgb stable from yesterday. -Darco shoe for ambulation -f/u with Dr. Myra Gianotti in 2-3 weeks.    Doreatha Massed, PA-C [facility] [phone] 11/22/2018 10:27 AM  Agree with above.  All infected tissue removed.  Discussed antibiotics with primary team.  [name], MD [facility] [place] Office: [phone] Pager: [phone]

## 2018-11-22 NOTE — ED Triage Notes (Signed)
Brought by ems from home for c/o bleeding to right foot s/p partial amputation on Wednesday.  Was discharged from hospital today.  Started bleeding this evening.  Reports putting pressure on foot during transfers only.  Rewrapped by ems.

## 2018-11-22 NOTE — Evaluation (Signed)
Occupational Therapy Evaluation Patient Details Name: Danielle Buchanan MRN: [id number] DOB: 05-23-1956 Today's Date: 11/22/2018    History of Present Illness 63 yo female with history of injury to R 3rd toe in January, wound worsened and she now received amputation of R 2nd/3rd/5th toes on 11/12/2018 and R great toe amputation on 11/20/18. PMH DM, HTN, L AKA    Clinical Impression   Pt PTA: living alone with family and neighbors assisting as needed. Pt was using w/c or power chair most of the time. RW for transfers. Pt currently limited by RLE s/p sx. Pt performing bed mobility with supervisionA; sit to stands  With maxA +2 for trunk elevation and power up and stand pivot with ModA +2 with RW and guidance for stepping pattern with RW. Pt with multiple attempts for standing and getting BLEs under self to stand upright before able to transfer to regular recliner. NT and board updated for bari stedy use. Pt set-upA for UB ADL and ModA for LB ADL. Pt would greatly benefit from continued OT skilled services for ADL, mobility and safety in SNF setting. OT to follow acutely.    Follow Up Recommendations  SNF;Supervision/Assistance - 24 hour    Equipment Recommendations  3 in 1 bedside commode    Recommendations for Other Services       Precautions / Restrictions Precautions Precautions: Fall Restrictions Weight Bearing Restrictions: No RLE Weight Bearing: Weight bearing as tolerated Other Position/Activity Restrictions: WBAT through heel in darco shoe       Mobility Bed Mobility Overal bed mobility: Needs Assistance Bed Mobility: Supine to Sit     Supine to sit: HOB elevated;Supervision Sit to supine: Min guard   General bed mobility comments: Supervision for lines with bed mobilities. Pt moving well, no VC required.  Transfers Overall transfer level: Needs assistance Equipment used: Rolling walker (2 wheeled) Transfers: Sit to/from [employer] Sit to Stand: From  elevated surface;+2 physical assistance;Max assist Stand pivot transfers: Mod assist;+2 physical assistance;+2 safety/equipment       General transfer comment: Sit<>stand attempted 5x. Able to clear bottom after 2 attempts, but unable to pivot until 5th attempt. VC for forward weight shift and postural control. Pt requiring encouragement to trust her LEs to support her.    Balance Overall balance assessment: Needs assistance Sitting-balance support: Feet supported;Single extremity supported Sitting balance-Leahy Scale: Fair Sitting balance - Comments: Pt able to don prosthesis while sitting EOB. Some posterior LOB noted. Postural control: Left lateral lean Standing balance support: Bilateral upper extremity supported Standing balance-Leahy Scale: Poor Standing balance comment: Mod A for +2 transfer                           ADL either performed or assessed with clinical judgement   ADL Overall ADL's : Needs assistance/impaired Eating/Feeding: Set up;Sitting   Grooming: Set up;Sitting   Upper Body Bathing: Set up;Sitting   Lower Body Bathing: Moderate assistance;Sitting/lateral leans   Upper Body Dressing : Set up;Sitting   Lower Body Dressing: Moderate assistance;Sitting/lateral leans;Sit to/from Market researcher Details (indicate cue type and reason): unable to find drop arm commode         Functional mobility during ADLs: Moderate assistance;Maximal assistance;+2 for physical assistance;+2 for safety/equipment;Rolling walker(prosthetic on LLE and RLE requires darco shoe)       Vision Baseline Vision/History: No visual deficits Vision Assessment?: No apparent visual deficits     Perception  Praxis      Pertinent Vitals/Pain Pain Assessment: 0-10 Pain Score: 4  Faces Pain Scale: Hurts little more Pain Location: R foot Pain Descriptors / Indicators: Guarding;Grimacing;Sore Pain Intervention(s): Limited activity within patient's  tolerance;Premedicated before session     Hand Dominance Right   Extremity/Trunk Assessment Upper Extremity Assessment Upper Extremity Assessment: Generalized weakness   Lower Extremity Assessment Lower Extremity Assessment: Generalized weakness RLE Deficits / Details: recent R metatarsal amputation for entire foot.   Cervical / Trunk Assessment Cervical / Trunk Assessment: Kyphotic   Communication Communication Communication: No difficulties   Cognition Arousal/Alertness: Awake/alert Behavior During Therapy: WFL for tasks assessed/performed;Anxious Overall Cognitive Status: No family/caregiver present to determine baseline cognitive functioning                                 General Comments: Pt fearful, but willing to participate with encouragement.   General Comments       Exercises     Shoulder Instructions      Home Living Family/patient expects to be discharged to:: Skilled nursing facility Living Arrangements: Alone Available Help at Discharge: Family Type of Home: Apartment Home Access: Level entry     Home Layout: One level     Bathroom Shower/Tub: Chief Strategy OfficerTub/shower unit   Bathroom Toilet: Standard Bathroom Accessibility: No   Home Equipment: Environmental consultantWalker - 4 wheels;Shower seat;Wheelchair - manual          Prior Functioning/Environment Level of Independence: Needs assistance  Gait / Transfers Assistance Needed: reports she was Mod(I)  ADL's / Homemaking Assistance Needed: reports she was Mod(I)            OT Problem List: Decreased strength;Decreased activity tolerance;Impaired balance (sitting and/or standing);Decreased safety awareness;Pain      OT Treatment/Interventions: Self-care/ADL training;Therapeutic exercise;Energy conservation;Neuromuscular education;Therapeutic activities;Patient/family education;Balance training    OT Goals(Current goals can be found in the care plan section) Acute Rehab OT Goals Patient Stated Goal: go  home  OT Goal Formulation: With patient Time For Goal Achievement: 12/06/18 Potential to Achieve Goals: Good ADL Goals Pt Will Perform Lower Body Dressing: with set-up;with adaptive equipment Pt Will Transfer to Toilet: with min assist;bedside commode Pt Will Perform Toileting - Clothing Manipulation and hygiene: with min assist;sitting/lateral leans;sit to/from stand  OT Frequency: Min 3X/week   Barriers to D/C: Decreased caregiver support          Co-evaluation PT/OT/SLP Co-Evaluation/Treatment: Yes Reason for Co-Treatment: Complexity of the patient's impairments (multi-system involvement);To address functional/ADL transfers   OT goals addressed during session: ADL's and self-care;Strengthening/ROM      AM-PAC OT "6 Clicks" Daily Activity     Outcome Measure Help from another person eating meals?: None Help from another person taking care of personal grooming?: None Help from another person toileting, which includes using toliet, bedpan, or urinal?: A Lot Help from another person bathing (including washing, rinsing, drying)?: A Lot Help from another person to put on and taking off regular upper body clothing?: A Little Help from another person to put on and taking off regular lower body clothing?: A Lot 6 Click Score: 17   End of Session Equipment Utilized During Treatment: Gait belt Nurse Communication: (bari stedy)  Activity Tolerance: Patient tolerated treatment well Patient left: in chair;with call bell/phone within reach;with chair alarm set  OT Visit Diagnosis: Unsteadiness on feet (R26.81);Muscle weakness (generalized) (M62.81)  Time: 2951-8841 OT Time Calculation (min): 30 min Charges:  OT General Charges $OT Visit: 1 Visit OT Evaluation $OT Eval Moderate Complexity: 1 Mod  Cristi Loron) Glendell Docker OTR/L Acute Rehabilitation Services Pager: [phone] Office: [phone]   Sandrea Hughs 11/22/2018, 12:29 PM

## 2018-11-22 NOTE — Discharge Instructions (Addendum)
As we discussed, make sure you are not putting any pressure on the tip of the foot when you transfer.  You should try and put on the pressure on the heel.  Call vascular surgeons office on Monday to arrange for an appointment.  Return to emergency department for worsening pain, worsening symptoms.

## 2018-11-22 NOTE — ED Provider Notes (Signed)
[facility] EMERGENCY DEPARTMENT Provider Note   CSN: [id number] Arrival date & time: 11/22/18  2112     History   Chief Complaint Chief Complaint  Patient presents with  . Post-op Problem    HPI Danielle Buchanan is a 63 y.o. female hospital history of depression, diabetes, dyslipidemia, hypertension who is status post right TMA on 11/19/18 who presents for evaluation of bleeding from wound.  Patient reports that she was discharged from the hospital earlier today with dressing in place.  Patient reports that she was told she could transfer.  She reports usually put pressure on feet when she transferred.  She reports that she transferred to the bathroom.  She reports about 7:30 PM this evening, she started noting bleeding that was going through the gauze.  Patient states that it continued bleeds and she could not stop it, prompting EMS call.  Patient reports she is on Plavix and states that she was given her dose of Plavix earlier today.  She denies any trauma, injury to the area.  The history is provided by the patient.    Past Medical History:  Diagnosis Date  . Allergic rhinitis   . Depression   . Diabetes (HCC)   . Dyslipidemia   . HTN (hypertension)   . Insomnia   . OSA (obstructive sleep apnea)    no cpap  . Urinary incontinence     Patient Active Problem List   Diagnosis Date Noted  . Osteomyelitis of third toe of right foot (HCC) 11/12/2018  . Pain in finger of right hand 02/20/2018  . Atherosclerosis of native arteries of the extremities with gangrene (HCC) 11/01/2017  . Unilateral AKA, left (HCC)   . Post-operative pain   . OSA (obstructive sleep apnea)   . Benign essential HTN   . Diabetes mellitus type 2 in obese (HCC)   . Hyperkalemia   . Leukocytosis   . Acute blood loss anemia   . Amputation stump infection (HCC) 08/23/2017  . Amputation of left lower extremity above knee upon examination (HCC) 08/23/2017  . PAD (peripheral artery  disease) (HCC) 07/23/2017  . Cellulitis 06/10/2017  . Hyponatremia 06/10/2017  . Fever 06/10/2017  . Rash 05/24/2017  . Diabetes (HCC)   . HTN (hypertension)   . Allergic rhinitis   . Insomnia   . Depression     Past Surgical History:  Procedure Laterality Date  . ABDOMINAL AORTOGRAM W/LOWER EXTREMITY N/A 06/24/2017   Procedure: ABDOMINAL AORTOGRAM W/LOWER EXTREMITY;  Surgeon: Maeola Harman, MD;  Location: [facility] INVASIVE CV LAB;  Service: Cardiovascular;  Laterality: N/A;  . ABDOMINAL AORTOGRAM W/LOWER EXTREMITY Right 10/09/2017   Procedure: ABDOMINAL AORTOGRAM W/LOWER EXTREMITY;  Surgeon: Maeola Harman, MD;  Location: [facility] INVASIVE CV LAB;  Service: Cardiovascular;  Laterality: Right;  . AMPUTATION Left 07/23/2017   Procedure: AMPUTATION  BELOW KNEE;  Surgeon: Maeola Harman, MD;  Location: [facility] OR;  Service: Vascular;  Laterality: Left;  . AMPUTATION Left 08/12/2017   Procedure: REVISION BELOW KNEE;  Surgeon: Maeola Harman, MD;  Location: [facility] OR;  Service: Vascular;  Laterality: Left;  . AMPUTATION Left 08/27/2017   Procedure: left ABOVE KNEE AMPUTATION;  Surgeon: Maeola Harman, MD;  Location: [facility] OR;  Service: Vascular;  Laterality: Left;  . AMPUTATION Left 08/30/2017   Procedure: REVISION AMPUTATION ABOVE KNEE;  Surgeon: Nada Libman, MD;  Location: [facility] OR;  Service: Vascular;  Laterality: Left;  . AMPUTATION Right 11/04/2017   Procedure: AMPUTATION  RIGHT FOURTH TOE;  Surgeon: Maeola Harmanain, Brandon Christopher, MD;  [facility];  Service: Vascular;  Laterality: Right;  . AMPUTATION Right 11/12/2018   Procedure: AMPUTATION OF second, third and fifth toes;  Surgeon: Nada LibmanBrabham, Vance W, MD;  Location: [facility] OR;  Service: Vascular;  Laterality: Right;  . APPLICATION OF WOUND VAC Left 08/27/2017   Procedure: APPLICATION OF WOUND VAC;  Surgeon: Maeola Harmanain, Brandon Christopher, MD;  [facility];  Service: Vascular;  Laterality: Left;  . CARPAL TUNNEL  RELEASE Right   . COLONOSCOPY    . LOWER EXTREMITY ANGIOGRAM Right 11/12/2018   Procedure: ANGIOGRAM OF Right LEG;  Surgeon: Nada LibmanBrabham, Vance W, MD;  [facility];  Service: Vascular;  Laterality: Right;  . LOWER EXTREMITY INTERVENTION Right 10/09/2017   Procedure: LOWER EXTREMITY INTERVENTION;  Surgeon: Maeola Harmanain, Brandon Christopher, MD;  [facility];  Service: Cardiovascular;  Laterality: Right;  . TOE AMPUTATION Right 11/12/2018   2ND 3RD & 5TH TOE   . TRANSMETATARSAL AMPUTATION Right 11/19/2018   Procedure: TRANSMETATARSAL AMPUTATION REVISION;  Surgeon: Nada LibmanBrabham, Vance W, MD;  [facility];  Service: Vascular;  Laterality: Right;     OB History   No obstetric history on file.      Home Medications    Prior to Admission medications   Medication Sig Start Date End Date Taking? Authorizing Provider  amitriptyline (ELAVIL) 25 MG tablet Take 25 mg by mouth at bedtime.   Yes [provider]  aspirin EC 81 MG EC tablet Take 1 tablet (81 mg total) by mouth daily. 11/23/18  Yes Sheikh, Omair Latif, DO  clopidogrel (PLAVIX) 75 MG tablet Take 1 tablet (75 mg total) by mouth daily with breakfast. 11/23/18  Yes Sheikh, Omair Latif, DO  insulin NPH-regular Human (NOVOLIN 70/30) (70-30) 100 UNIT/ML injection 120 units with breakfast, and 90 units with supper, and syringes 3/day. Patient taking differently: Inject 60-100 Units into the skin 2 (two) times daily with a meal. 60 units with breakfast, and 100 units with supper, and syringes 3/day. 03/11/18  Yes Romero BellingEllison, Sean, MD  LORazepam (ATIVAN) 0.5 MG tablet Take 1 tablet (0.5 mg total) by mouth 2 (two) times daily as needed for anxiety. 11/22/18  Yes Sheikh, Omair Latif, DO  losartan (COZAAR) 100 MG tablet Take 100 mg by mouth daily.   Yes [provider]  Multiple Vitamin (MULTIVITAMIN WITH MINERALS) TABS tablet Place 1 tablet into feeding tube daily. 11/23/18  Yes Sheikh, Omair Latif, DO  nicotine polacrilex (NICORELIEF) 2  MG gum Take 2 mg by mouth daily as needed for smoking cessation.    Yes [provider]  oseltamivir (TAMIFLU) 75 MG capsule Take 1 capsule (75 mg total) by mouth daily for 5 days. 11/23/18 11/28/18 Yes Sheikh, Omair Latif, DO  oxyCODONE-acetaminophen (PERCOCET/ROXICET) 5-325 MG tablet Take 1-2 tablets by mouth every 4 (four) hours as needed for moderate pain or severe pain. 11/22/18  Yes Sheikh, Omair Latif, DO  polyethylene glycol (MIRALAX / GLYCOLAX) packet Take 17 g by mouth daily as needed for moderate constipation. 11/22/18  Yes Sheikh, Omair Latif, DO  pravastatin (PRAVACHOL) 20 MG tablet Take 40 mg by mouth daily.    Yes [provider]  senna-docusate (SENOKOT-S) 8.6-50 MG tablet Take 1 tablet by mouth 2 (two) times daily. 11/22/18  Yes Sheikh, Omair Latif, DO  sertraline (ZOLOFT) 100 MG tablet Take 100 mg by mouth daily.    Yes [provider]  sulfamethoxazole-trimethoprim (BACTRIM DS,SEPTRA DS) 800-160 MG tablet  Take 1 tablet by mouth 2 (two) times daily for 14 days. 11/22/18 12/06/18 Yes Sheikh, Omair Latif, DO  traZODone (DESYREL) 50 MG tablet Take 50 mg by mouth at bedtime.   Yes [provider]  VITAMIN E PO Take 180 mg by mouth daily.   Yes [provider]  Zinc 50 MG TABS Take 50 mg by mouth daily.   Yes [provider]    Family History Family History  Problem Relation Age of Onset  . Heart attack Mother   . Alcohol abuse Mother   . Heart attack Father   . Alcohol abuse Father   . Heart attack Sister   . Heart attack Brother   . Hypercholesterolemia Sister   . Diabetes Neg Hx     Social History Social History   Tobacco Use  . Smoking status: Former Smoker    Packs/day: 1.00    Years: 51.00    Pack years: 51.00    Types: Cigarettes    Last attempt to quit: 08/08/2018    Years since quitting: 0.2  . Smokeless tobacco: Never Used  Substance Use Topics  . Alcohol use: No    Alcohol/week: 0.0 standard drinks  . Drug  use: No     Allergies   Milk-related compounds and Doxycycline   Review of Systems Review of Systems  Skin: Positive for wound.  All other systems reviewed and are negative.    Physical Exam Updated Vital Signs BP (!) 156/71   Pulse 82   Temp 99.2 F (37.3 C) (Oral)   Ht 5\' 4"  (1.626 m)   Wt 86.2 kg   SpO2 94%   BMI 32.61 kg/m   Physical Exam Vitals signs and nursing note reviewed.  Constitutional:      Appearance: She is well-developed.  HENT:     Head: Normocephalic and atraumatic.  Eyes:     General: No scleral icterus.       Right eye: No discharge.        Left eye: No discharge.     Conjunctiva/sclera: Conjunctivae normal.  Pulmonary:     Effort: Pulmonary effort is normal.  Musculoskeletal:     Comments: Left AKA.  Right TMA amputation. Situres in place with no evidence of separation.  No evidence of wound dehiscence.  Small area of oozing noted to the medial aspect that slightly improves after pressure.  Skin:    General: Skin is warm and dry.  Neurological:     Mental Status: She is alert.  Psychiatric:        Speech: Speech normal.        Behavior: Behavior normal.      ED Treatments / Results  Labs (all labs ordered are listed, but only abnormal results are displayed) Labs Reviewed - No data to display  EKG None  Radiology No results found.  Procedures Procedures (including critical care time)  Medications Ordered in ED Medications  tranexamic acid (CYKLOKAPRON) injection 500 mg (500 mg Topical Given 11/22/18 2328)     Initial Impression / Assessment and Plan / ED Course  I have reviewed the triage vital signs and the nursing notes.  Pertinent labs & imaging results that were available during my care of the patient were reviewed by me and considered in my medical decision making (see chart for details).     63 year old female with recent metatarsal amputation of right foot comes in for evaluation of wound bleeding.  She reports  she is on Plavix  and states she did take it.  She states she was told she could transfer and put slight weight on it.  She transferred this evening and it caused some bleeding.  She states that she has not been able to stop bleeding.  She denies any trauma or injury. Patient is afebrile, non-toxic appearing, sitting comfortably on examination table. Vital signs reviewed and stable.  Evaluation of wound with sutures in place.  No evidence of wound separation.  No evidence of dehiscence.  There is some amount of using from the medial aspect.  Bleeding otherwise clotting.  Plan for pressure dressing and reassess.  Re-evaluation after combat gauze. It is sttill oozing throughout the gauze.  Will plan TXA soaked gauze. Discussed with patient regarding SNF placemnt. She had been offered SNF placement during admission but declined. She again declines and wishes to go home. Will plan for home health to come help her.   Evaluation after gauze placement.  Bleeding has subsided.  I discussed with patient regarding home health nurse.  She was offered SNF placement but declined.  Patient states she wants to go home tonight.  She does have a postop shoe she was given but she states is too long and it keeps catching which may be contributing to her symptoms.  Patient states she feels comfortable going home.  Patient to follow-up with vascular. Patient had ample opportunity for questions and discussion. All patient's questions were answered with full understanding. Strict return precautions discussed. Patient expresses understanding and agreement to plan.    RN informed me that patient's neighbors were coming to pick her up. Patient placed her prosthetic leg on LLE but had difficulty bearing weight on the leg and could barely transer from bed to chair. When I asked why patient was unable to bear weight on her LLE, patient states she is scared she might fall. Her neighbors tell me she only recently started with the protesthic  about 6 months ago and has not become comfortable with it. At this time, there is no indication for admission. Her wound is closed and shows no signs of infection. Patient and neighbors are concerned about patient going home alone. Will plan ot board patient in the ED and consult casee management for them to see her tomorrow morning.   Portions of this note were generated with Scientist, clinical (histocompatibility and immunogenetics). Dictation errors may occur despite best attempts at proofreading.   Final Clinical Impressions(s) / ED Diagnoses   Final diagnoses:  Bleeding from wound    ED Discharge Orders         Ordered    Home Health     11/22/18 2258    Face-to-face encounter (required for Medicare/Medicaid patients)    Comments:  I Maxwell Caul certify that this patient is under my care and that I, or a nurse practitioner or physician's assistant working with me, had a face-to-face encounter that meets the physician face-to-face encounter requirements with this patient on 11/22/2018. The encounter with the patient was in whole, or in part for the following medical condition(s) which is the primary reason for home health care (List medical condition): Metatarsal amputation   11/22/18 2258           Rosana Hoes 11/23/18 0051    Margarita Grizzle, MD 11/23/18 1458

## 2018-11-23 ENCOUNTER — Other Ambulatory Visit: Payer: Self-pay

## 2018-11-23 MED ORDER — SENNOSIDES-DOCUSATE SODIUM 8.6-50 MG PO TABS
1.0000 | ORAL_TABLET | Freq: Two times a day (BID) | ORAL | Status: DC
  Administered 2018-11-24: 1 via ORAL
  Filled 2018-11-23 (×5): qty 1

## 2018-11-23 MED ORDER — OXYCODONE-ACETAMINOPHEN 5-325 MG PO TABS
1.0000 | ORAL_TABLET | Freq: Once | ORAL | Status: AC
  Administered 2018-11-23: 1 via ORAL
  Filled 2018-11-23: qty 1

## 2018-11-23 MED ORDER — AMITRIPTYLINE HCL 25 MG PO TABS
25.0000 mg | ORAL_TABLET | Freq: Every day | ORAL | Status: DC
  Administered 2018-11-23 – 2018-11-26 (×5): 25 mg via ORAL
  Filled 2018-11-23 (×5): qty 1

## 2018-11-23 MED ORDER — LORAZEPAM 0.5 MG PO TABS: 0.5000 mg | ORAL_TABLET | Freq: Two times a day (BID) | ORAL | Status: DC | PRN

## 2018-11-23 MED ORDER — ASPIRIN EC 81 MG PO TBEC
81.0000 mg | DELAYED_RELEASE_TABLET | Freq: Every day | ORAL | Status: DC
  Administered 2018-11-24 – 2018-11-26 (×3): 81 mg via ORAL
  Filled 2018-11-23 (×3): qty 1

## 2018-11-23 MED ORDER — INSULIN ASPART PROT & ASPART (70-30 MIX) 100 UNIT/ML ~~LOC~~ SUSP
60.0000 [IU] | Freq: Two times a day (BID) | SUBCUTANEOUS | Status: DC
  Administered 2018-11-24: 60 [IU] via SUBCUTANEOUS
  Filled 2018-11-23: qty 10

## 2018-11-23 MED ORDER — SULFAMETHOXAZOLE-TRIMETHOPRIM 800-160 MG PO TABS
1.0000 | ORAL_TABLET | Freq: Two times a day (BID) | ORAL | Status: DC
  Administered 2018-11-23 – 2018-11-26 (×8): 1 via ORAL
  Filled 2018-11-23 (×8): qty 1

## 2018-11-23 MED ORDER — LOSARTAN POTASSIUM 50 MG PO TABS
100.0000 mg | ORAL_TABLET | Freq: Every day | ORAL | Status: DC
  Administered 2018-11-23 – 2018-11-26 (×4): 100 mg via ORAL
  Filled 2018-11-23 (×4): qty 2

## 2018-11-23 MED ORDER — PRAVASTATIN SODIUM 40 MG PO TABS
40.0000 mg | ORAL_TABLET | Freq: Every day | ORAL | Status: DC
  Administered 2018-11-23 – 2018-11-26 (×4): 40 mg via ORAL
  Filled 2018-11-23 (×4): qty 1

## 2018-11-23 MED ORDER — OXYCODONE-ACETAMINOPHEN 5-325 MG PO TABS
1.0000 | ORAL_TABLET | ORAL | Status: DC | PRN
  Administered 2018-11-23 (×2): 2 via ORAL
  Administered 2018-11-24: 1 via ORAL
  Administered 2018-11-24: 2 via ORAL
  Administered 2018-11-25 – 2018-11-26 (×4): 1 via ORAL
  Administered 2018-11-26: 2 via ORAL
  Administered 2018-11-26: 1 via ORAL
  Filled 2018-11-23: qty 1
  Filled 2018-11-23 (×2): qty 2
  Filled 2018-11-23: qty 1
  Filled 2018-11-23: qty 2
  Filled 2018-11-23 (×4): qty 1
  Filled 2018-11-23: qty 2

## 2018-11-23 MED ORDER — CLOPIDOGREL BISULFATE 75 MG PO TABS
75.0000 mg | ORAL_TABLET | Freq: Every day | ORAL | Status: DC
  Administered 2018-11-24 – 2018-11-27 (×4): 75 mg via ORAL
  Filled 2018-11-23 (×4): qty 1

## 2018-11-23 NOTE — ED Notes (Signed)
Attempted to get patient up to wheelchair.  Three person assist needed to get patient in the chair.  Patient unable to bear weight.  Assisted patient to get into hospital bed for comfort.  Family at the bedside.  Encouraged to call for assistance as needed.

## 2018-11-23 NOTE — Discharge Summary (Signed)
Physician Discharge Summary  Danielle Buchanan ZOX:[id number] DOB: [birth year] DOA: 11/11/2018  PCP: [name], MD  Admit date: 11/11/2018 Discharge date: 11/23/2018  Admitted From: Home Disposition:  Home with Home Health Services as patient adamantly refused SNF Placement  Recommendations for Outpatient Follow-up:  1. Follow up with PCP in 1-2 weeks 2. Follow up with Vascular Surgery within 1 week 3. Follow up with ID within 1-2 weeks   4. Please obtain CMP/CBC, Mag, Phos in one week 5. Please follow up on the following pending results:  Home Health: No (Patient Refused)  Equipment/Devices: 3in1 Bedside Commode  Discharge Condition: Stable  CODE STATUS: FULL CODE Diet recommendation: Heart Healthy Carb Modified   Brief/Interim Summary: Danielle Buchanan a 63 y.o.year old femalewith medical history significant for buy not limited to DM2, HTN, PAD s/p L AKA in 2018, R 4th toe amputation andwho presented on2/4/2020with concern for grossly infected/necrotic third right toe after podiatrist outpatient visitand was found to havenecrotic third right toe. Outpatient XR documented as bony destruction suggestive of osteomyelitisat the middle phalanx of the right third toe. No gas in soft tissues. She underwent Transmetatarsal amputation on 11/12/2018 (Amputation of toes 2-5) with revision today (removal of big toe) by Dr. Myra Gianotti. Post-operatively she is having some pain so pain medications will be adjusted. Vascular removed Wound VAC and are starting Dry Dressing changes yesterday. I discussed with Dr. Darrick Penna of Vascular Surgery who felt the patient had clean margins. I had previously discussed with Dr. Daiva Eves and he states that if she had clean margins she could be transitioned to po Abx (Bactrim) and would not need IV Ceftriaxone for 6 weeks for her Serratia Osteomyelitis. PT/OT worked with the patient and recommending SNF and she was deemed stable to D/C and Vascular re-evaluated foot and  recommended chaning dressing and adding a ABD and Kerlix and 4" ACE wrap with follow up with Dr. Myra Gianotti. I discussed with her that it was recommended that she go to SNF for therapy but patient she was of sound mind and had capacity to refuse so will go home with Home Health Services. She was told to follow up with PCP, Vascular Surgery, and ID in the outpatient setting.    Discharge Diagnoses:  Principal Problem:   Osteomyelitis of third toe of right foot (HCC) Active Problems:   HTN (hypertension)   Depression   PAD (peripheral artery disease) (HCC)   Amputation of left lower extremity above knee upon examination (HCC)   Benign essential HTN   Diabetes mellitus type 2 in obese [facility])   Atherosclerosis of native arteries of the extremities with gangrene (HCC)  Sepsis secondary to necrotic right third toe/osteomyelitis from Serratia. S/p Transmetatarsal amputation with Wound VAC placement on 2/5/20202 with Revision of TMA 11/19/2018 -Sepsis resolved.  -Underwent revision on 11/19/2018 -Continude IV Ceftriaxone per ID recs, will have follow up arranged. Per Dr. Daiva Eves plan is treating her aggressively for 6 weeks of IV Ceftriaxone as margins weren't entirely clear the first time she underwent Amputation but the second time they were so I rediscussed with Vascular and ID and Dr. Daiva Eves Recommends 2 weeks of Po Bactrim at D/C  -PICC line placed for 6 weeks of Abx but removed. PT recommends SNF, patient is not interested and adamantly refuses.  -Will likely go home with home health PT once medically stable and cleared from a Vascular Perspective. Vascular has no objections to D/C but will need to discuss again about going to SNF  but she refused  -Will get PT/OT to evaluate and Treat to Maximize Home Health Services -C/w Pain control with Acetaminophen 650 mg q4hprn, Oxycodone-Acetaminophen 1-2 tab po q6hprn for Moderate,Severe Pain but have increased the frequency to q4hprn -Increased IV Fentanyl  12.5 mcg to 25 mcg q2hprn for Uncontrolled Pain not amenable to po  -Will add bowel regimen with Senna-Docusate 1 tab po BID and Miralax 17 grams po Dailyprn for Moderate Constipation  -WBC went from 9.6 -> 11.2 but likely reactive from Surgical Intervention and is now back down to 8.1 -Per vascular they removed the wound VAC today and are starting Dry Dressing Changes. Vascular recommended: 4x4, ABD, and Kerilx and 4" Ace Bandage for bloody dressing and Darco Shoe for Ambulation   -Social work spoke with the patient and patient refusing skilled nursing facility at this time and will go home with home health services; She is to follow up with Dr. Myra GianottiBrabham in 2-3 weeks   Severe stenosis of right peroneal artery, status post balloon angioplasty on 2/6.  -No residual stenosis.  -Continue home Aspirin 81 mg po daily and Clopidogrel 75 mg po Daily added after procedure and will continue at D/C  Acute on Chronic Normocytic Anemia -Stable.  -Acute exacerbation during stay due to postoperative blood loss anemia.  -Suspect patient has chronic anemia as has had low hemoglobin of 08/2018.  -Prior to admission hemoglobin was 13.3, suspect this was hemoconcentrated?.  -Hb/Hct is now  Down from 11.3/35.3 and is now 11.1/35.4 -No active bleeding currently but will continue to Monitor for S/Sx of Bleeding -Repeat CBC in AM    Type 2 Diabetes, poorly controlled.  -HemoglobinA1c 10.9% (11/12/2018).  -On a significant amount of insulin at home (190 units total daily).  -Only on lantus 53 U here and may need to increase  -Held home Lantus on 2/12 prior to procedure but will resume now -Given significant postprandial hyperglycemia added NovoLog 3 units 3 times daily.  -C/w Moderate Novolog SSI AC -CBG's ranging from 136-294  Hypertension -Stable. BP this AM was 139/68 -Continue home Losartan 100 mg po Daily -Continue with IV Hydralazine 5 mg q20 minprn for High Blood Pressure x2  doses  Hyperlipidemia -Stable. -C/w Home Pravastatin 40 mg po Daily   Depression and Anxiety; Insomnia  -Stable  -Continue home Sertraline 100 mg po Daily and Amitriptyline 25 mg po qHS -C/w Lorazepam 0.5 mg po BIDprn Anxiety -C/w Trazodone 50 mg po qHS  Tobacco Abuse/Smoker -Smoking Cessation Counseling given; Stopped Smoking a Year ago -C/w Nicotine Polacrilex gum 2 mg po Dailyprn Smoking Cessation   Hypomagnesemia -Patient's Mag Level this AM was 1.6 -Replete again with IV Mag Sulfate 2 grams prior to D/C -Continue to Monitor and Replete as Necessary -Repeat Mag Level in AM  Obesity -Estimated body mass index is 34.95 kg/m as calculated from the following:   Height as of this encounter: 5\' 5"  (1.651 m).   Weight as of this encounter: 95.3 kg. -Weight Loss and Dietary Counseling given   ? Flu Exposure -She is neighbor has a flu and this is likely transmitted to the patient's neighbor nosocomially -I discussed with Infectious Diseases Dr. Jerolyn Centerynthia Snyder who recommends placing the patient on Prophylactic Tamiflu 75 mg po Daily x7 days -Patient started developing flulike symptoms will test with influenza a and B via PCR  Discharge Instructions  Discharge Instructions    Call MD for:  difficulty breathing, headache or visual disturbances   Complete by:  As directed  Call MD for:  extreme fatigue   Complete by:  As directed    Call MD for:  hives   Complete by:  As directed    Call MD for:  persistant dizziness or light-headedness   Complete by:  As directed    Call MD for:  persistant nausea and vomiting   Complete by:  As directed    Call MD for:  redness, tenderness, or signs of infection (pain, swelling, redness, odor or green/yellow discharge around incision site)   Complete by:  As directed    Call MD for:  severe uncontrolled pain   Complete by:  As directed    Call MD for:  temperature >100.4   Complete by:  As directed    Diet - low sodium heart  healthy   Complete by:  As directed    Diet Carb Modified   Complete by:  As directed    Discharge instructions   Complete by:  As directed    You were cared for by a hospitalist during your hospital stay. If you have any questions about your discharge medications or the care you received while you were in the hospital after you are discharged, you can call the unit and ask to speak with the hospitalist on call if the hospitalist that took care of you is not available. Once you are discharged, your primary care physician will handle any further medical issues. Please note that NO REFILLS for any discharge medications will be authorized once you are discharged, as it is imperative that you return to your primary care physician (or establish a relationship with a primary care physician if you do not have one) for your aftercare needs so that they can reassess your need for medications and monitor your lab values.  Follow up with PCP and Vascular Surgery as an outpatient. Take all medications as prescribed. If symptoms change or worsen please return to the ED for evaluation   Increase activity slowly   Complete by:  As directed      Allergies as of 11/22/2018      Reactions   Milk-related Compounds Other (See Comments)   incontinence   Doxycycline Rash      Medication List    TAKE these medications   amitriptyline 25 MG tablet Commonly known as:  ELAVIL Take 25 mg by mouth at bedtime.   aspirin 81 MG EC tablet Take 1 tablet (81 mg total) by mouth daily. What changed:    medication strength  how much to take   clopidogrel 75 MG tablet Commonly known as:  PLAVIX Take 1 tablet (75 mg total) by mouth daily with breakfast.   insulin NPH-regular Human (70-30) 100 UNIT/ML injection 120 units with breakfast, and 90 units with supper, and syringes 3/day. What changed:    how much to take  how to take this  when to take this  additional instructions   LORazepam 0.5 MG  tablet Commonly known as:  ATIVAN Take 1 tablet (0.5 mg total) by mouth 2 (two) times daily as needed for anxiety.   losartan 100 MG tablet Commonly known as:  COZAAR Take 100 mg by mouth daily.   multivitamin with minerals Tabs tablet Place 1 tablet into feeding tube daily.   NICORELIEF 2 MG gum Generic drug:  nicotine polacrilex Take 2 mg by mouth daily as needed for smoking cessation.   oseltamivir 75 MG capsule Commonly known as:  TAMIFLU Take 1 capsule (75 mg total) by mouth  daily for 5 days.   oxyCODONE-acetaminophen 5-325 MG tablet Commonly known as:  PERCOCET/ROXICET Take 1-2 tablets by mouth every 4 (four) hours as needed for moderate pain or severe pain.   polyethylene glycol packet Commonly known as:  MIRALAX / GLYCOLAX Take 17 g by mouth daily as needed for moderate constipation.   pravastatin 20 MG tablet Commonly known as:  PRAVACHOL Take 40 mg by mouth daily.   senna-docusate 8.6-50 MG tablet Commonly known as:  Senokot-S Take 1 tablet by mouth 2 (two) times daily.   sertraline 100 MG tablet Commonly known as:  ZOLOFT Take 100 mg by mouth daily.   sulfamethoxazole-trimethoprim 800-160 MG tablet Commonly known as:  BACTRIM DS,SEPTRA DS Take 1 tablet by mouth 2 (two) times daily for 14 days.   traZODone 50 MG tablet Commonly known as:  DESYREL Take 50 mg by mouth at bedtime.   VITAMIN E PO Take 180 mg by mouth daily.   Zinc 50 MG Tabs Take 50 mg by mouth daily.      Follow-up Information    Nada LibmanBrabham, Vance W, MD In 3 weeks.   Specialties:  Vascular Surgery, Cardiology Why:  Office will call you to arrange your appt (sent) Contact information: [address] [place] [address] [phone]          Allergies  Allergen Reactions  . Milk-Related Compounds Other (See Comments)    incontinence  . Doxycycline Rash   Consultations:  Vascular Surgery  Infectious Diseases  Procedures/Studies: Dg Chest Port 1 View  Result Date:  11/12/2018 CLINICAL DATA:  Sepsis EXAM: PORTABLE CHEST 1 VIEW COMPARISON:  06/10/2017 FINDINGS: Shallow inspiration. Cardiac enlargement. No vascular congestion, edema, or consolidation. No blunting of costophrenic angles. No pneumothorax. Mediastinal contours appear intact. IMPRESSION: Cardiac enlargement. No evidence of active pulmonary disease. Electronically Signed   By: Burman NievesWilliam  Stevens M.D.   On: 11/12/2018 00:26   Dg Foot Complete Right  Result Date: 11/11/2018 Please see detailed radiograph report in office note.  [place] Ekg Site Rite  Result Date: 11/14/2018 [facility] not attached, placement could not be confirmed due to current cardiac rhythm.  [place] Ekg Site Rite  Result Date: 11/14/2018 [facility] not [facility], placement could not be confirmed due to current cardiac rhythm.   Procedure Performed on 11/12/2018 by Dr. Myra GianottiBrabham 1. Ultrasound-guided access, left femoral artery 2. Abdominal aortogram 3. Right lower extremity runoff 4. Angioplasty, right tibioperoneal trunk and peroneal artery 5. Closure device (Mynx) 6. Transmetatarsal amputation, toes 2 through 5 7. Placement of transmetatarsal amputation site wound VAC  Procedure Performed on 11/19/2018 by Dr. Myra GianottiBrabham                         #1:Re-do reght TMA amputation #2: Placement of right forefoot wound VAC  Subjective: Seen And examined at bedside was doing better.  Discussed with vascular about her margin and Dr. Darrick Pennafields states that he believes that she had clean margins a second time she was operated on.  I discussed the case with Dr. Algis LimingVandam who then recommended if she had clean margins to change her to p.o. antibiotics with Bactrim.  She was improved and deemed medically stable to be discharged to skilled nursing facility however patient adamantly refused and wanted to go home with home health so we will  maximize her home health services.  I told her I was concerned that she may be back to the hospital if she did  not go to therapy but she was of sound mind to make her own decisions and decided that she would want to go home.  She is to follow-up with PCP, vascular surgery and infectious disease in outpatient setting.  Discharge Exam: Vitals:   11/22/18 0407 11/22/18 1348  BP: 125/74 139/68  Pulse: 68 67  Resp: 16 17  Temp: 98.2 F (36.8 C) 98.2 F (36.8 C)  SpO2: 92% 93%   Vitals:   11/21/18 0604 11/21/18 2136 11/22/18 0407 11/22/18 1348  BP: (!) 154/75 (!) 157/73 125/74 139/68  Pulse: 64 66 68 67  Resp: 16 15 16 17   Temp: 97.8 F (36.6 C) 98.2 F (36.8 C) 98.2 F (36.8 C) 98.2 F (36.8 C)  TempSrc: Oral Oral Oral Oral  SpO2: 93% 97% 92% 93%  Weight:      Height:       General: Pt is alert, awake, not in acute distress Cardiovascular: RRR, S1/S2 +, no rubs, no gallops Respiratory: CTA bilaterally, no wheezing, no rhonchi Abdominal: Soft, NT, Distended, bowel sounds + Extremities: no edema, no cyanosis; Right TMA with dressing and has a Left AKA  The results of significant diagnostics from this hospitalization (including imaging, microbiology, ancillary and laboratory) are listed below for reference.    Microbiology: No results found for this or any previous visit (from the past 240 hour(s)).   Labs: BNP (last 3 results) No results for input(s): BNP in the last 8760 hours. Basic Metabolic Panel: Recent Labs  Lab 11/18/18 0358 11/19/18 0444 11/20/18 0414 11/21/18 0416 11/22/18 0452  NA 137 138 137 137 137  K 3.5 3.7 3.8 4.0 4.0  CL 100 104 101 102 104  CO2 24 28 25 26 27   GLUCOSE 210* 168* 231* 201* 206*  BUN 8 11 14 11 10   CREATININE 0.68 0.72 0.78 0.78 0.66  CALCIUM 8.9 8.9 8.4* 8.9 8.6*  MG  --   --  1.6* 1.6* 1.6*  PHOS  --   --  4.1 4.1 4.4   Liver Function Tests: Recent Labs  Lab 11/20/18 0414 11/21/18 0416 11/22/18 0452  AST 12* 14* 13*  ALT 15  16 15   ALKPHOS 56 54 55  BILITOT 0.4 0.4 0.3  PROT 6.0* 6.2* 5.9*  ALBUMIN 2.6* 2.8* 2.8*   No results for input(s): LIPASE, AMYLASE in the last 168 hours. No results for input(s): AMMONIA in the last 168 hours. CBC: Recent Labs  Lab 11/18/18 0358 11/19/18 0444 11/20/18 0414 11/21/18 0416 11/22/18 0452  WBC 9.8 9.6 11.2* 8.8 8.1  NEUTROABS  --   --  7.5 5.5 5.5  HGB 11.4* 11.3* 10.3* 11.0* 11.1*  HCT 35.3* 35.3* 33.3* 34.6* 35.4*  MCV 88.0 88.3 88.6 88.7 88.1  PLT 328 321 308 325 301   Cardiac Enzymes: No results for input(s): CKTOTAL, CKMB, CKMBINDEX, TROPONINI in the last 168 hours. BNP: Invalid input(s): POCBNP CBG: Recent Labs  Lab 11/21/18 1639 11/21/18 2206 11/22/18 0602 11/22/18 1113 11/22/18 1725  GLUCAP 211* 159* 191* 136* 294*   D-Dimer No results for input(s): DDIMER in the last 72 hours. Hgb A1c No results for input(s): HGBA1C in the last 72 hours. Lipid Profile No results for input(s): CHOL, HDL, LDLCALC, TRIG, CHOLHDL, LDLDIRECT in the last 72 hours. Thyroid function studies No results for input(s): TSH, T4TOTAL, T3FREE, THYROIDAB in the last 72 hours.  Invalid input(s): FREET3 Anemia work up No results for input(s): VITAMINB12, FOLATE, FERRITIN, TIBC, IRON, RETICCTPCT in the last 72 hours.  Urinalysis    Component Value Date/Time   COLORURINE YELLOW 11/11/2018 2246   APPEARANCEUR CLEAR 11/11/2018 2246   LABSPEC 1.022 11/11/2018 2246   PHURINE 6.0 11/11/2018 2246   GLUCOSEU >=500 (A) 11/11/2018 2246   HGBUR SMALL (A) 11/11/2018 2246   BILIRUBINUR NEGATIVE 11/11/2018 2246   KETONESUR NEGATIVE 11/11/2018 2246   PROTEINUR NEGATIVE 11/11/2018 2246   NITRITE NEGATIVE 11/11/2018 2246   LEUKOCYTESUR TRACE (A) 11/11/2018 2246   Sepsis Labs Invalid input(s): PROCALCITONIN,  WBC,  LACTICIDVEN Microbiology No results found for this or any previous visit (from the past 240 hour(s)).  Time coordinating discharge: 35 minutes  SIGNED:  Merlene Laughter, DO [facility] 11/23/2018, 6:18 PM Pager is on AMION  If 7PM-7AM, please contact night-coverage [email or web address] Password [email or web address]

## 2018-11-23 NOTE — ED Notes (Signed)
Ordered lunch tray for pt  

## 2018-11-23 NOTE — Progress Notes (Addendum)
2:11PM: Initial referrals sent out to [place], [place] SNF locations per patient's request and provided with Medicare.gov list of providers.  CSW met with patient and discuss skilled nursing. CSW noted patient was receptive to it after having bleeding from her leg and having difficulty using her prosthetic. CSW noted patient requested to stay in [facility] and requested CSW to consult with her nephew [name]. CSW informed [name] patient's family contact and noted family was supportive of SNF. CSW will begin SNF process.  Lamonte Richer, LCSW, [place] Worker II [phone]

## 2018-11-23 NOTE — ED Notes (Signed)
Pts family at bedside

## 2018-11-23 NOTE — Care Management (Signed)
ED CM updated [facility] concerning workup for SNF placement.

## 2018-11-23 NOTE — Clinical Social Work Note (Signed)
Clinical Social Work Assessment  Patient Details  Name: DIJON KOHLMAN MRN: [id number] Date of Birth: 1956-03-01  Date of referral:  11/23/18               Reason for consult:  Discharge Planning, Facility Placement                Permission sought to share information with:  Facility Sport and exercise psychologist, Family Supports Permission granted to share information::  Yes, Verbal Permission Granted  Name::        Agency::     Relationship::     Contact Information:     Housing/Transportation Living arrangements for the past 2 months:  Single Family Home Source of Information:  Patient, Adult Children Patient Interpreter Needed:  None Criminal Activity/Legal Involvement Pertinent to Current Situation/Hospitalization:  No - Comment as needed Significant Relationships:  Adult Children, [place] Lives with:  Self Do you feel safe going back to the place where you live?  No Need for family participation in patient care:  Yes (Comment)(Per patient request)  Care giving concerns:  Patient reports she lives alone and has had difficulty with her recent surgery and prosthetic leg. Patient reports she has been unable to appropriate manage her wound at home. Patient has supportive family who would like her to go to a SNF and patient is now receptive to it.   Social Worker assessment / plan:  Patient was open about wanting to go home and not remain in a facility. Patient left yesterday with home health and was unable to manage a recent bleeding from her recent surgery. Patient is now open to SNF as she had identified the need for the rehabilitation to support her in returning to her home. CSW met with patient and spoke with family and noted they were open to patient being referred to SNF in the [place] area. CSW will sent relative information out to these providers and has provided patient with a Medicare.gov copy of providers.   Employment status:  Retired Office manager PT Recommendations:  [place], [place] / Referral to community resources:     Patient/Family's Response to care:  Patient acknowledges that she would prefer to be home but is now open to SNF due to difficulties she had returning to home last time. CSW noted family is supportive to SNF placement.  Patient/Family's Understanding of and Emotional Response to Diagnosis, Current Treatment, and Prognosis:  Patient and family responded appropriately and openly to SNF discussion.   Emotional Assessment Appearance:  Appears stated age Attitude/Demeanor/Rapport:  Apprehensive Affect (typically observed):  Accepting, Adaptable, Appropriate, Calm Orientation:  Oriented to Self, Oriented to Situation, Oriented to Place, Oriented to  Time Alcohol / Substance use:  Not Applicable Psych involvement (Current and /or in the community):  No (Comment)  Discharge Needs  Concerns to be addressed:  No discharge needs identified Readmission within the last 30 days:  No Current discharge risk:  None Barriers to Discharge:  No Barriers Identified   Oretha Milch, LCSW 11/23/2018, 1:54 PM

## 2018-11-23 NOTE — ED Notes (Addendum)
Pt arrived to Rm 49 via bed. Pt noted to be alert, oriented. Pt's prosthetic noted to be on bed w/pt. Right foot noted w/bandage - dry, intact. Pure Wick noted. Pt talking on her cell phone. Pt voices understanding of tx plan - SNF placement. Call bell w/in reach.

## 2018-11-23 NOTE — ED Notes (Signed)
ORDERED BREAKFAST TRAY FOR PT

## 2018-11-23 NOTE — NC FL2 (Signed)
Danielle Buchanan MEDICAID FL2 LEVEL OF CARE SCREENING TOOL     IDENTIFICATION  Patient Name: Danielle Buchanan Birthdate: December 22, 1955 Sex: female Admission Date (Current Location): 11/22/2018  [facility] and [place] Number:  Producer, television/film/video and Address:  [facility] [place]. [facility], [address], [place], [place] [address]      Provider Number: [id number]  Attending Physician Name and Address:  No att. providers found  Relative Name and Phone Number:  Marikay Alar, nephew,  [phone]    Current Level of Care: Hospital Recommended Level of Care: Skilled Nursing Facility Prior Approval Number:    Date Approved/Denied: 07/29/17 PASRR Number: [id number]  Discharge Plan: Home    Current Diagnoses: Patient Active Problem List   Diagnosis Date Noted  . Osteomyelitis of third toe of right foot (HCC) 11/12/2018  . Pain in finger of right hand 02/20/2018  . Atherosclerosis of native arteries of the extremities with gangrene (HCC) 11/01/2017  . Unilateral AKA, left (HCC)   . Post-operative pain   . OSA (obstructive sleep apnea)   . Benign essential HTN   . Diabetes mellitus type 2 in obese (HCC)   . Hyperkalemia   . Leukocytosis   . Acute blood loss anemia   . Amputation stump infection (HCC) 08/23/2017  . Amputation of left lower extremity above knee upon examination (HCC) 08/23/2017  . PAD (peripheral artery disease) (HCC) 07/23/2017  . Cellulitis 06/10/2017  . Hyponatremia 06/10/2017  . Fever 06/10/2017  . Rash 05/24/2017  . Diabetes (HCC)   . HTN (hypertension)   . Allergic rhinitis   . Insomnia   . Depression     Orientation RESPIRATION BLADDER Height & Weight     Self, Time, Situation, Place  Normal(Room) Continent Weight: 190 lb (86.2 kg) Height:  5\' 4"  (162.6 cm)  BEHAVIORAL SYMPTOMS/MOOD NEUROLOGICAL BOWEL NUTRITION STATUS  Other (Comment)(None known at this time)   Continent    AMBULATORY STATUS COMMUNICATION OF NEEDS Skin   Extensive Assist  Verbally Surgical wounds                       Personal Care Assistance Level of Assistance  Bathing, Dressing Bathing Assistance: Maximum assistance   Dressing Assistance: Maximum assistance     Functional Limitations Info             SPECIAL CARE FACTORS FREQUENCY  PT (By licensed PT), OT (By licensed OT)     PT Frequency: 5x Weekly OT Frequency: 5x Weekly            Contractures Contractures Info: Not present    Additional Factors Info  Code Status, Allergies(Full, Allergy Doxycycline, milk based products) Code Status Info: Full Allergies Info: Doxycline, milk based products           Current Medications (11/23/2018):  This is the current hospital active medication list Current Facility-Administered Medications  Medication Dose Route Frequency Provider Last Rate Last Dose  . amitriptyline (ELAVIL) tablet 25 mg  25 mg Oral QHS Graciella Freer A, PA-C   25 mg at 11/23/18 0053  . insulin aspart protamine- aspart (NOVOLOG MIX 70/30) injection 60-100 Units  60-100 Units Subcutaneous BID WC Schlossman, Erin, MD      . LORazepam (ATIVAN) tablet 0.5 mg  0.5 mg Oral BID PRN Alvira Monday, MD      . losartan (COZAAR) tablet 100 mg  100 mg Oral Daily Alvira Monday, MD      . oxyCODONE-acetaminophen (PERCOCET/ROXICET) [id number]  MG per tablet 1-2 tablet  1-2 tablet Oral Q4H PRN Alvira Monday, MD      . pravastatin (PRAVACHOL) tablet 40 mg  40 mg Oral Daily Schlossman, Erin, MD      . senna-docusate (Senokot-S) tablet 1 tablet  1 tablet Oral BID Alvira Monday, MD      . sulfamethoxazole-trimethoprim (BACTRIM DS,SEPTRA DS) 800-160 MG per tablet 1 tablet  1 tablet Oral BID Alvira Monday, MD       Current Outpatient Medications  Medication Sig Dispense Refill  . amitriptyline (ELAVIL) 25 MG tablet Take 25 mg by mouth at bedtime.    Marland Kitchen aspirin EC 81 MG EC tablet Take 1 tablet (81 mg total) by mouth daily. 30 tablet 0  . clopidogrel (PLAVIX) 75 MG tablet Take 1  tablet (75 mg total) by mouth daily with breakfast. 30 tablet 0  . insulin NPH-regular Human (NOVOLIN 70/30) (70-30) 100 UNIT/ML injection 120 units with breakfast, and 90 units with supper, and syringes 3/day. (Patient taking differently: Inject 60-100 Units into the skin 2 (two) times daily with a meal. 60 units with breakfast, and 100 units with supper, and syringes 3/day.) 210 mL 3  . LORazepam (ATIVAN) 0.5 MG tablet Take 1 tablet (0.5 mg total) by mouth 2 (two) times daily as needed for anxiety. 10 tablet 0  . losartan (COZAAR) 100 MG tablet Take 100 mg by mouth daily.    . Multiple Vitamin (MULTIVITAMIN WITH MINERALS) TABS tablet Place 1 tablet into feeding tube daily. 30 tablet 0  . nicotine polacrilex (NICORELIEF) 2 MG gum Take 2 mg by mouth daily as needed for smoking cessation.     Marland Kitchen oseltamivir (TAMIFLU) 75 MG capsule Take 1 capsule (75 mg total) by mouth daily for 5 days. 5 capsule 0  . oxyCODONE-acetaminophen (PERCOCET/ROXICET) 5-325 MG tablet Take 1-2 tablets by mouth every 4 (four) hours as needed for moderate pain or severe pain. 20 tablet 0  . polyethylene glycol (MIRALAX / GLYCOLAX) packet Take 17 g by mouth daily as needed for moderate constipation. 14 each 0  . pravastatin (PRAVACHOL) 20 MG tablet Take 40 mg by mouth daily.     Marland Kitchen senna-docusate (SENOKOT-S) 8.6-50 MG tablet Take 1 tablet by mouth 2 (two) times daily. 30 tablet 0  . sertraline (ZOLOFT) 100 MG tablet Take 100 mg by mouth daily.     Marland Kitchen sulfamethoxazole-trimethoprim (BACTRIM DS,SEPTRA DS) 800-160 MG tablet Take 1 tablet by mouth 2 (two) times daily for 14 days. 28 tablet 0  . traZODone (DESYREL) 50 MG tablet Take 50 mg by mouth at bedtime.    Marland Kitchen VITAMIN E PO Take 180 mg by mouth daily.    . Zinc 50 MG TABS Take 50 mg by mouth daily.       Discharge Medications: Please see discharge summary for a list of discharge medications.  Relevant Imaging Results:  Relevant Lab Results:   Additional Information    Danielle Buchanan  Danielle Ochs, LCSW

## 2018-11-23 NOTE — ED Notes (Signed)
Water given as requested

## 2018-11-23 NOTE — Care Management (Signed)
ED CM noted that patient was discharge from [facility] yesterday. SNF was offered but patient declined stated she had support at home and agreed on [facility] services.  Patient arrived home and was unable to transfer from w/c to bed and noted bleeding from wound and returned to the ED. Patient and family are  requesting SNF placement. CM discussed with  ED CSW the process will be started.

## 2018-11-23 NOTE — ED Notes (Signed)
Family leaving at this time. Pt lying on bed watching tv.

## 2018-11-24 LAB — CBG MONITORING, ED
Glucose-Capillary: 291 mg/dL — ABNORMAL HIGH (ref 70–99)
Glucose-Capillary: 161 mg/dL — ABNORMAL HIGH (ref 70–99)

## 2018-11-24 MED ORDER — INSULIN ASPART 100 UNIT/ML ~~LOC~~ SOLN
0.0000 [IU] | Freq: Three times a day (TID) | SUBCUTANEOUS | Status: DC
  Administered 2018-11-25 – 2018-11-26 (×4): 5 [IU] via SUBCUTANEOUS
  Administered 2018-11-26 – 2018-11-27 (×2): 8 [IU] via SUBCUTANEOUS

## 2018-11-24 NOTE — Progress Notes (Signed)
2nd shift ED CSW received a handoff from the 1st shift [facility] ED CSW.   CSW called [facility] for an update to see if the [facility] system is "up and running" so the insurance auth can take place and left a VM for Krista in admissions at ph: [phone].   CSW awaiting return call from [place] at [place].  CSW called pt's nephew Lavena Stanford at ph: [phone] with an update in answer to the pt's newphew's query.  [name] was appreciative and thanked the CSW.  CSW will continue to follow for D/C needs.  Dorothe Pea. Zailen Albarran, LCSW, LCAS, CSI Clinical Social Worker Ph: [phone]

## 2018-11-24 NOTE — ED Notes (Signed)
Breakfast Tray Ordered. 

## 2018-11-24 NOTE — Progress Notes (Addendum)
CSW received handoff from Weekend CSW. Per notes, patient recently discharged from inpatient hospital stay on 2/15. Patient was offerend SNF placement but declined stating she wanted to return home with [facility]. Patient now agreeable to go to SNF. Per Weekend CSW, he faxed out patient's referral to SNFs in [place] area. Per Weekend CSW, patient's family is requesting [facility]. CSW spoke with patient's nephew, Tammy Sours, who confirmed that family toured [facility] yesterday and were pleased with the facility. CSW attempted to reach out to admissions at [facility] regarding bed availability. CSW left voicemail for return call.  8:55am- CSW received return call from [place], Admissions at [facility], who confirmed they have open beds and is agreeable to review patient's referral. Per Dot Lanes, she would call CSW back with a decision.  12:08pm- CSW aware [facility] SNF has made a bed offer on patient. CSW has attempted to reach out to [place] to start insurance authorization. CSW left voicemail for return call.  3:02pm- CSW informed by Dot Lanes that they can accept patient and are starting insurance authorization. CSW will continue to follow.  Archie Balboa, LCSWA  Clinical Social Work Department  [employer]  [phone]

## 2018-11-25 ENCOUNTER — Ambulatory Visit: Payer: Medicare HMO | Admitting: Physical Therapy

## 2018-11-25 LAB — CBG MONITORING, ED
Glucose-Capillary: 224 mg/dL — ABNORMAL HIGH (ref 70–99)
Glucose-Capillary: 225 mg/dL — ABNORMAL HIGH (ref 70–99)
Glucose-Capillary: 227 mg/dL — ABNORMAL HIGH (ref 70–99)

## 2018-11-25 NOTE — Progress Notes (Signed)
CSW aware patient is awaiting SNF placement with [facility]- CSW is waiting for insurance authorization but will continue to update.   Stacy Gardner, LCSW Clinical Social Worker  [facility]  [phone]

## 2018-11-25 NOTE — Progress Notes (Signed)
Physical Therapy Evaluation Patient Details Name: Danielle Buchanan MRN: [id number] DOB: Jan 16, 1956 Today's Date: 11/25/2018   History of Present Illness  Patient is 63 y/o female returning to hospital following d/c on same day due to bleeding from surgical site. Patient s/p R transmetarsal amputation on 11/19/18. Patient unable to care for self at home. PMH includes DM, HTN, cellulitis, L AKA, and PAD.    Clinical Impression  Patient presenting to ED secondary to problems above and with deficits below. Patient required heavy modA to sit EOB. Educated and reviewed seated HEP with patient for R LE strengthening. Deferred further functional mobility secondary to patient not having darco shoe with her. Given functional mobility deficits and decreased caregiver assistance, recommending SNF level therapy. Patient will benefit from acute physical therapy to maximize independence and safety with functional mobility.     Follow Up Recommendations SNF    Equipment Recommendations  Other (comment)(TBD at next venue)    Recommendations for Other Services       Precautions / Restrictions Precautions Precautions: Fall Required Braces or Orthoses: Other Brace Other Brace: darco shoe on RLE Restrictions Weight Bearing Restrictions: Yes RLE Weight Bearing: Weight bearing as tolerated Other Position/Activity Restrictions: WBAT through heel in darco shoe       Mobility  Bed Mobility Overal bed mobility: Needs Assistance Bed Mobility: Supine to Sit;Sit to Supine     Supine to sit: Mod assist;HOB elevated Sit to supine: Min guard   General bed mobility comments: Required heavy modA for trunk control to sit EOB. Required min guard to return to supine for safety.   Transfers                 General transfer comment: Deferred due to patient not having darco shoe  Ambulation/Gait                Stairs            Wheelchair Mobility    Modified Rankin (Stroke Patients  Only)       Balance Overall balance assessment: Needs assistance Sitting-balance support: Feet unsupported;No upper extremity supported Sitting balance-Leahy Scale: Fair Sitting balance - Comments: Able to maintain static sitting without LOB. Required 1UE for dynamic sitting when challenged outside of BOS.                                      Pertinent Vitals/Pain Pain Assessment: Faces Faces Pain Scale: Hurts little more Pain Location: R foot Pain Descriptors / Indicators: Guarding;Grimacing;Sore Pain Intervention(s): Limited activity within patient's tolerance;Monitored during session;Repositioned    Home Living Family/patient expects to be discharged to:: Skilled nursing facility                      Prior Function Level of Independence: Needs assistance   Gait / Transfers Assistance Needed: Requires asssit to transfer to wheelchair since surgery. Per patient's nephew, required 5 people to attempt transfer in ED           Hand Dominance        Extremity/Trunk Assessment   Upper Extremity Assessment Upper Extremity Assessment: Overall WFL for tasks assessed    Lower Extremity Assessment Lower Extremity Assessment: Generalized weakness;RLE deficits/detail;LLE deficits/detail RLE Deficits / Details: s/p R transmetatarsal amputation LLE Deficits / Details: L AKA    Cervical / Trunk Assessment Cervical / Trunk Assessment: Normal  Communication   Communication:  No difficulties  Cognition Arousal/Alertness: Awake/alert Behavior During Therapy: WFL for tasks assessed/performed Overall Cognitive Status: Within Functional Limits for tasks assessed                                        General Comments General comments (skin integrity, edema, etc.): Patient nephew in room during session.     Exercises General Exercises - Lower Extremity Long Arc Quad: AROM;Right;10 reps;Seated Hip Flexion/Marching: AROM;Right;10  reps;Seated Other Exercises Other Exercises: R LE: seated IR, ER, x 10 reps;  Other Exercises: sitting reaching without UE support in/out of BOS x10;    Assessment/Plan    PT Assessment Patient needs continued PT services  PT Problem List Decreased strength;Decreased range of motion;Decreased activity tolerance;Decreased balance;Decreased mobility;Decreased knowledge of use of DME;Decreased knowledge of precautions;Pain       PT Treatment Interventions DME instruction;Functional mobility training;Balance training;Patient/family education;Gait training;Therapeutic activities;Therapeutic exercise    PT Goals (Current goals can be found in the Care Plan section)  Acute Rehab PT Goals Patient Stated Goal: get stronger PT Goal Formulation: With patient Time For Goal Achievement: 12/09/18 Potential to Achieve Goals: Good    Frequency Min 2X/week   Barriers to discharge Decreased caregiver support      Co-evaluation               AM-PAC PT "6 Clicks" Mobility  Outcome Measure Help needed turning from your back to your side while in a flat bed without using bedrails?: A Little Help needed moving from lying on your back to sitting on the side of a flat bed without using bedrails?: A Lot Help needed moving to and from a bed to a chair (including a wheelchair)?: A Lot Help needed standing up from a chair using your arms (e.g., wheelchair or bedside chair)?: A Lot Help needed to walk in hospital room?: Total Help needed climbing 3-5 steps with a railing? : Total 6 Click Score: 11    End of Session   Activity Tolerance: Patient tolerated treatment well Patient left: with call bell/phone within reach;with family/visitor present;in bed Nurse Communication: Mobility status PT Visit Diagnosis: Other abnormalities of gait and mobility (R26.89);Muscle weakness (generalized) (M62.81);Difficulty in walking, not elsewhere classified (R26.2)    Time: 1140-1153 PT Time Calculation (min)  (ACUTE ONLY): 13 min   Charges:   PT Evaluation $PT Eval Moderate Complexity: 1 Mod          Vanessa Ralphs, SPT  Vanessa Ralphs 11/25/2018, 1:23 PM

## 2018-11-25 NOTE — ED Provider Notes (Signed)
63 year old female here awaiting SNF placement with [facility] status post transmetatarsal amputation right foot on 09/19/2019.  Patient resting comfortably in exam bed, wound was examined, no erythema discharge or signs of infection.  Wound will be wrapped by nursing staff.  Vitals:   11/25/18 0600 11/25/18 1124  BP: (!) 155/83 (!) 144/69  Pulse: 73 80  Resp: 18 16  Temp: 97.7 F (36.5 C)   SpO2: 95% 95%     Eyvonne Mechanic, PA-C 11/25/18 1503    Loren Racer, MD 11/26/18 [phone]

## 2018-11-25 NOTE — Progress Notes (Signed)
2nd shift ED CSW received a handoff from the 1st shift [facility] ED CSW.   CSW called [facility] for an update to see if the insurance Berkley Harvey has taken place and left a VM for Krista in admissions at ph: [phone].   CSW awaiting return call from [place] at [place].  CSW called pt's nephew Lavena Stanford at ph: [phone] with an update in answer to the pt's newphew's query.  [name] was appreciative and thanked the CSW.  CSW will continue to follow for D/C needs.  Dorothe Pea. Djuan Talton, LCSW, LCAS, CSI Clinical Social Worker Ph: [phone]     '

## 2018-11-26 LAB — CBG MONITORING, ED
Glucose-Capillary: 258 mg/dL — ABNORMAL HIGH (ref 70–99)
Glucose-Capillary: 221 mg/dL — ABNORMAL HIGH (ref 70–99)
Glucose-Capillary: 314 mg/dL — ABNORMAL HIGH (ref 70–99)

## 2018-11-26 NOTE — Progress Notes (Addendum)
13:46: CSW spoke with [place] from [place]- patients son, Tammy Sours, would like to private pay at [facility] until insurance authorization can be received. Patient is able to be transferred to facility once AVS has been updated. RN, lindsay, updated.    Please call report to [phone]  Insurance authorization still pending for SNF placement. CSW will continue to update.   Stacy Gardner, LCSW Clinical Social Worker  [facility] [employer]  [phone]

## 2018-11-26 NOTE — ED Notes (Signed)
PTAR called/cancel @ 1832-per Lynnze, RN-called by Marylene Land

## 2018-11-26 NOTE — ED Notes (Signed)
Pts dressing on foot changed. Area surrounding sutures is pink. There is a scant amount of clear drainage present.

## 2018-11-26 NOTE — Progress Notes (Signed)
CSW emailed via [employer] a copy of the AVS on a word document to [place] at [place] in admissions to [email or web address] ED CSW covering [facility] ED from [facility] unable to print actual AVS from Epic) and a copy of the AVS must still be faxed to admissions at [facility] on 2/20 prior to pt's D/C in order for the pt to receive needed meds upon arrival to [facility] at fax: [phone].  RN updated.  CSW asked EPD to please place CSW consult so ED CSW on 2/20 will see CSW handoff and assist with insuring AVS is faxed to the fax # above prior to D/C and that Dot Lanes in admissions at ph: [phone] confirms fax ([facility]) has been received prior to pt D/C'ing.  CSW will continue to follow for D/C needs.  Dorothe Pea. Kamoni Gentles, LCSW, LCAS, CSI Clinical Social Worker Ph: [phone]

## 2018-11-26 NOTE — ED Notes (Signed)
PTAR called @ 1629-per Lynnze, RN-called by Marylene Land

## 2018-11-26 NOTE — Progress Notes (Addendum)
CSW received a call from [place] at [place] who stated she needed the updated AVS faxed to her, RN was called and RN faxed.    Dot Lanes later called and stated she would call if the entire AVS was not received due to only the coversheet arriving via fax.  At approx 5:30-5:45 CSW received a message from [place] at [place] that fax was not received and that pt will have to go to [facility] on 2/20.  CSW stated that was not an option, that [facility] had been called and RN re-faxed while CSW was present via phone.  Dot Lanes stated she would wait 6:05pm and if fax is not received medications would not be able to be re-ordered and pt will have to D/C on 2/20.  At [employer] calls and says only the cover page is coming though again and pt will have to D/C on 11/27/18.  Dot Lanes stated she will call the pt's nephew and update him.  RN updated.  CSW will continue to follow for D/C needs.  Dorothe Pea. Ourania Hamler, LCSW, LCAS, CSI Clinical Social Worker Ph: [phone]

## 2018-11-27 ENCOUNTER — Other Ambulatory Visit: Payer: Self-pay

## 2018-11-27 ENCOUNTER — Non-Acute Institutional Stay (SKILLED_NURSING_FACILITY): Payer: Medicare HMO | Admitting: Internal Medicine

## 2018-11-27 ENCOUNTER — Encounter: Payer: Self-pay | Admitting: Internal Medicine

## 2018-11-27 DIAGNOSIS — Z7902 Long term (current) use of antithrombotics/antiplatelets: Secondary | ICD-10-CM | POA: Diagnosis not present

## 2018-11-27 DIAGNOSIS — E1351 Other specified diabetes mellitus with diabetic peripheral angiopathy without gangrene: Secondary | ICD-10-CM | POA: Diagnosis not present

## 2018-11-27 DIAGNOSIS — Z89421 Acquired absence of other right toe(s): Secondary | ICD-10-CM | POA: Diagnosis not present

## 2018-11-27 DIAGNOSIS — Z9189 Other specified personal risk factors, not elsewhere classified: Secondary | ICD-10-CM | POA: Insufficient documentation

## 2018-11-27 DIAGNOSIS — D62 Acute posthemorrhagic anemia: Secondary | ICD-10-CM | POA: Diagnosis not present

## 2018-11-27 DIAGNOSIS — Z79899 Other long term (current) drug therapy: Secondary | ICD-10-CM | POA: Diagnosis not present

## 2018-11-27 DIAGNOSIS — M96831 Postprocedural hemorrhage and hematoma of a musculoskeletal structure following other procedure: Secondary | ICD-10-CM | POA: Diagnosis not present

## 2018-11-27 DIAGNOSIS — Z89431 Acquired absence of right foot: Secondary | ICD-10-CM | POA: Diagnosis not present

## 2018-11-27 DIAGNOSIS — E119 Type 2 diabetes mellitus without complications: Secondary | ICD-10-CM | POA: Diagnosis not present

## 2018-11-27 DIAGNOSIS — R296 Repeated falls: Secondary | ICD-10-CM | POA: Diagnosis not present

## 2018-11-27 DIAGNOSIS — Z87891 Personal history of nicotine dependence: Secondary | ICD-10-CM | POA: Diagnosis not present

## 2018-11-27 DIAGNOSIS — Z7401 Bed confinement status: Secondary | ICD-10-CM | POA: Diagnosis not present

## 2018-11-27 DIAGNOSIS — I70261 Atherosclerosis of native arteries of extremities with gangrene, right leg: Secondary | ICD-10-CM | POA: Diagnosis not present

## 2018-11-27 DIAGNOSIS — Z89612 Acquired absence of left leg above knee: Secondary | ICD-10-CM | POA: Diagnosis not present

## 2018-11-27 DIAGNOSIS — S88012A Complete traumatic amputation at knee level, left lower leg, initial encounter: Secondary | ICD-10-CM | POA: Diagnosis not present

## 2018-11-27 DIAGNOSIS — M869 Osteomyelitis, unspecified: Secondary | ICD-10-CM | POA: Diagnosis not present

## 2018-11-27 DIAGNOSIS — Z7982 Long term (current) use of aspirin: Secondary | ICD-10-CM | POA: Diagnosis not present

## 2018-11-27 DIAGNOSIS — T8740 Infection of amputation stump, unspecified extremity: Secondary | ICD-10-CM | POA: Diagnosis not present

## 2018-11-27 DIAGNOSIS — Z794 Long term (current) use of insulin: Secondary | ICD-10-CM | POA: Diagnosis not present

## 2018-11-27 DIAGNOSIS — T8189XA Other complications of procedures, not elsewhere classified, initial encounter: Secondary | ICD-10-CM | POA: Diagnosis not present

## 2018-11-27 DIAGNOSIS — E1169 Type 2 diabetes mellitus with other specified complication: Secondary | ICD-10-CM | POA: Diagnosis not present

## 2018-11-27 DIAGNOSIS — S78112A Complete traumatic amputation at level between left hip and knee, initial encounter: Secondary | ICD-10-CM | POA: Diagnosis not present

## 2018-11-27 DIAGNOSIS — M6281 Muscle weakness (generalized): Secondary | ICD-10-CM | POA: Diagnosis not present

## 2018-11-27 DIAGNOSIS — E1365 Other specified diabetes mellitus with hyperglycemia: Secondary | ICD-10-CM

## 2018-11-27 DIAGNOSIS — G4733 Obstructive sleep apnea (adult) (pediatric): Secondary | ICD-10-CM | POA: Diagnosis not present

## 2018-11-27 DIAGNOSIS — Z4781 Encounter for orthopedic aftercare following surgical amputation: Secondary | ICD-10-CM | POA: Diagnosis not present

## 2018-11-27 DIAGNOSIS — I10 Essential (primary) hypertension: Secondary | ICD-10-CM | POA: Diagnosis not present

## 2018-11-27 DIAGNOSIS — I70209 Unspecified atherosclerosis of native arteries of extremities, unspecified extremity: Secondary | ICD-10-CM | POA: Diagnosis not present

## 2018-11-27 DIAGNOSIS — R58 Hemorrhage, not elsewhere classified: Secondary | ICD-10-CM | POA: Diagnosis not present

## 2018-11-27 DIAGNOSIS — Z89411 Acquired absence of right great toe: Secondary | ICD-10-CM | POA: Diagnosis not present

## 2018-11-27 DIAGNOSIS — IMO0002 Reserved for concepts with insufficient information to code with codable children: Secondary | ICD-10-CM

## 2018-11-27 DIAGNOSIS — Z89619 Acquired absence of unspecified leg above knee: Secondary | ICD-10-CM | POA: Diagnosis not present

## 2018-11-27 DIAGNOSIS — M2550 Pain in unspecified joint: Secondary | ICD-10-CM | POA: Diagnosis not present

## 2018-11-27 DIAGNOSIS — I739 Peripheral vascular disease, unspecified: Secondary | ICD-10-CM | POA: Diagnosis not present

## 2018-11-27 DIAGNOSIS — E1121 Type 2 diabetes mellitus with diabetic nephropathy: Secondary | ICD-10-CM | POA: Diagnosis not present

## 2018-11-27 DIAGNOSIS — R41841 Cognitive communication deficit: Secondary | ICD-10-CM | POA: Diagnosis not present

## 2018-11-27 LAB — CBG MONITORING, ED: Glucose-Capillary: 291 mg/dL — ABNORMAL HIGH (ref 70–99)

## 2018-11-27 MED ORDER — OXYCODONE-ACETAMINOPHEN 5-325 MG PO TABS: 2.0000 | ORAL_TABLET | Freq: Four times a day (QID) | ORAL | 0 refills | Status: AC | PRN

## 2018-11-27 NOTE — Progress Notes (Signed)
NURSING HOME LOCATION:  [facility] ROOM NUMBER:  [id number]  CODE STATUS:  Full Code  PCP:  [name] MD  This is a comprehensive admission note to [facility] performed on this date less than 30 days from date of admission. Included are preadmission medical/surgical history; reconciled medication list; family history; social history and comprehensive review of systems.  Corrections and additions to the records were documented. Comprehensive physical exam was also performed. Additionally a clinical summary was entered for each active diagnosis pertinent to this admission in the Problem List to enhance continuity of care.  HPI: Patient was hospitalized 2/4-2/15/2020 presenting with concern for grossly infected/necrotic third right toe as per Podiatry outpatient assessment.  Transmetatarsal amputation of toes 2-5 was completed 11/12/2018 and revision & removal of big toe 2/12 by Dr. Myra Gianotti.  As surgical margins were felt to be clean ID did not recommend parenteral antibiotics and she was transitioned to Bactrim for Serratia osteomyelitis.  PT/OT recommended SNF but the patient declined this and opted for home health services. The patient returned to the ED the day of discharge with postop bleeding from the right foot at the amputation site.  Patient alleges she was told that she could transfer.  She did apply pressure to the foot transferring to the bathroom.  Bleeding began approximately 7:30 PM on 2/15; this is in the context of Plavix therapy and low-dose aspirin. Patient was in the ED until 2/20 awaiting admission to SNF.  On exam 2/20 there was no evidence of active infection or bleeding.  There was scant amount of clear drainage described.  Glucose diary reveals a range of 201 up to 314 over the last week.  She had been on insulin 70/30 120 units with breakfast and 90 units with supper. A1c was 10.9% on 2/5. The patient had been possibly exposed to flu. Flulike symptoms were suggested  and influenza a and B via PCR was pursued.  ID recommended prophylactic Tamiflu 75 mg daily for 7 days, 2/16- 11/28/2018.   Past medical and surgical history: Includes essential hypertension, depression, peripheral arterial disease, diabetes, chronic normocytic anemia, OSA, dyslipidemia and atherosclerosis.  She has had repeated amputations.  Social history: Hospital record indicates she stopped smoking 08/08/2018.  She does not drink alcohol..  Family history: Reviewed; remarkable for alcohol abuse and cardiovascular events.   Review of systems: She gave the date as February 5 but then corrected this to February 12.  She describes intermittent pain in the foot as an aching discomfort.  She gave a history of taking opiates since October 2018.  She seemed indicate she was only taking it at bedtime to rest. She describes numbness in the third-fifth fingers bilaterally.  She has had carpal tunnel release surgery.  She is also had an ulnar nerve release procedure at the right elbow.  She is very vague as to glucoses; she can give me no specific numbers although she stated she did check her sugars.  Dr. Everardo All had her on doses of 90 in the morning and 80 of the evening of 70-30 insulin.   She has no obstructive sleep apnea symptoms.  She was intolerant to the CPAP machine.  She does have heat intolerance.  She has no other symptoms related to the diabetes.  Constitutional: No fever, significant weight change, fatigue  Eyes: No redness, discharge, pain, vision change ENT/mouth: No nasal congestion, purulent discharge, earache, change in hearing, sore throat  Cardiovascular: No chest pain, palpitations, paroxysmal nocturnal dyspnea, claudication, edema  Respiratory: No cough, sputum production, hemoptysis, DOE, significant snoring, apnea Gastrointestinal: No heartburn, dysphagia, abdominal pain, nausea /vomiting, rectal bleeding, melena, change in bowels Genitourinary: No dysuria, hematuria, pyuria,  incontinence, nocturia Musculoskeletal: No joint stiffness, joint swelling, weakness Dermatologic: No rash, pruritus, change in appearance of skin Neurologic: No dizziness, headache, syncope, seizures Psychiatric: No significant anxiety, depression, insomnia, anorexia Endocrine: No change in hair/skin/nails, excessive thirst, excessive hunger, excessive urination  Hematologic/lymphatic: No significant bruising, lymphadenopathy, abnormal bleeding Allergy/immunology: No itchy/watery eyes, significant sneezing, urticaria, angioedema  Physical exam:  Pertinent or positive findings: She had a fan on her upper torso because of the heat intolerance.  Her affect is flat and she exhibits somewhat of a "la belle indifference" attitude.  For instance when I was discussing the role of high fructose corn syrup sugars on central obesity and poor diabetic control; she looked out the window.  This discussion was initiated as she had a 12 ounce Pepsi at the bedside.  I explained that this would have 10.25 teaspoons of sugar.  Her response was "we are all going to die of something".  She states that she would " rather die of sodas than guns" . There is a slight droop to the left angle of the mouth.  There is some slight hirsutism of the chin.  Grade 1/2 systolic murmur is present.  The second heart sound is increased.  She has minor rales on auscultation.  AKA present on the left.  Right foot is wrapped. R pedal pulses could not be palpated.  There is an operative scar over the right elbow.    General appearance:  no acute distress, increased work of breathing is present.   Lymphatic: No lymphadenopathy about the head, neck, axilla. Eyes: No conjunctival inflammation or lid edema is present. There is no scleral icterus. Ears:  External ear exam shows no significant lesions or deformities.   Nose:  External nasal examination shows no deformity or inflammation. Nasal mucosa are pink and moist without lesions,  exudates Oral exam: Lips and gums are healthy appearing.There is no oropharyngeal erythema or exudate. Neck:  No thyromegaly, masses, tenderness noted.    Heart:  Normal rate and regular rhythm. S1  normal without gallop, click, rub.  Lungs: Chest clear to auscultation without wheezes, rhonchi, rales, rubs. Abdomen: Bowel sounds are normal.  Abdomen is soft and nontender with no organomegaly, hernias, masses. GU: Deferred  Extremities:  No cyanosis, clubbing, edema. Neurologic exam:  Balance, Rhomberg, finger to nose testing could not be completed due to clinical state Skin: Warm & dry w/o tenting. No significant rash.  See clinical summary under each active problem in the Problem List with associated updated therapeutic plan

## 2018-11-27 NOTE — Progress Notes (Signed)
CSW spoke with [place] from [place] and was informed that they can take pt today. Dot Lanes confirmed that she has received AVS. Pt will go to room 212. RN to call report to [phone].    CSW has called for PTAR at this time. No further CSW needs. CSW will sign off.       Claude Manges. Antaeus Karel, MSW, LCSW-A Emergency Department Clinical Social Worker [phone]

## 2018-11-27 NOTE — Patient Instructions (Signed)
See assessment and plan under each diagnosis in the problem list and acutely for this visit 

## 2018-11-27 NOTE — Assessment & Plan Note (Signed)
Patient denies active OSA symptoms

## 2018-11-27 NOTE — Progress Notes (Signed)
CSW received handoff from second shift ED CSW. CSW notified that pt is suppose to go to [place] today. CSW was asked to fax over AVS to [place] with [place]. CSW has faxed information over to her at this time. CSW awaiting confirmation that pt is able to come.     Claude Manges Shaydon Lease, MSW, LCSW-A Emergency Department Clinical Social Worker [phone]

## 2018-11-27 NOTE — ED Notes (Signed)
Report called to [place], Charity fundraiser at [employer]

## 2018-11-27 NOTE — Assessment & Plan Note (Addendum)
Patient was very vague about glucose levels at home She was disinterested in the discussion of excess sugar intake in the form of sugared beverages & complications of uncontrolled DM despite repeated amputations Adjust insulin @ SNF based on glucose recordings

## 2018-11-27 NOTE — Assessment & Plan Note (Addendum)
The > 80 % risk discussed; response was one of la belle indiffrence

## 2018-11-28 NOTE — Assessment & Plan Note (Addendum)
Wound care @ SNF  Complete 14 day course of oral antibiotic

## 2018-12-01 ENCOUNTER — Other Ambulatory Visit: Payer: Self-pay

## 2018-12-01 ENCOUNTER — Encounter: Payer: Self-pay | Admitting: Physician Assistant

## 2018-12-01 ENCOUNTER — Ambulatory Visit (INDEPENDENT_AMBULATORY_CARE_PROVIDER_SITE_OTHER): Payer: Medicare HMO | Admitting: Physician Assistant

## 2018-12-01 VITALS — BP 121/71 | HR 88 | Temp 97.5°F | Resp 16 | Ht 64.0 in | Wt 197.0 lb

## 2018-12-01 DIAGNOSIS — I739 Peripheral vascular disease, unspecified: Secondary | ICD-10-CM

## 2018-12-01 NOTE — Progress Notes (Signed)
POST OPERATIVE OFFICE NOTE    CC:  F/u for surgery  HPI:  This is a 63 y.o. female who is s/p Re-do right TMA 11/19/2018 by DR. Brabham.  She is here today for f/u wound check.  She is at a SNF for rehab.  She has a previous left AKA.    She has had a few episodes of right TMA incisional bleeding.  The first time she was sent to the ED and states they used a topical hemostatic agent to stop the bleeding.  She is now here for a follow up wound check visit.    She contniues to take a statin and Plavix daily.  Allergies  Allergen Reactions  . Milk-Related Compounds Other (See Comments)    incontinence  . Doxycycline Rash    Current Outpatient Medications  Medication Sig Dispense Refill  . amitriptyline (ELAVIL) 25 MG tablet Take 25 mg by mouth at bedtime.    Marland Kitchen aspirin EC 81 MG EC tablet Take 1 tablet (81 mg total) by mouth daily. 30 tablet 0  . clopidogrel (PLAVIX) 75 MG tablet Take 1 tablet (75 mg total) by mouth daily with breakfast. 30 tablet 0  . insulin NPH-regular Human (NOVOLIN 70/30) (70-30) 100 UNIT/ML injection 120 units with breakfast, and 90 units with supper, and syringes 3/day. (Patient taking differently: Inject 60-100 Units into the skin 2 (two) times daily with a meal. 60 units with breakfast, and 100 units with supper, and syringes 3/day.) 210 mL 3  . LORazepam (ATIVAN) 0.5 MG tablet Take 1 tablet (0.5 mg total) by mouth 2 (two) times daily as needed for anxiety. 10 tablet 0  . losartan (COZAAR) 100 MG tablet Take 100 mg by mouth daily.    . Multiple Vitamin (MULTIVITAMIN WITH MINERALS) TABS tablet Place 1 tablet into feeding tube daily. 30 tablet 0  . nicotine polacrilex (NICORELIEF) 2 MG gum Take 2 mg by mouth daily as needed for smoking cessation.     Marland Kitchen oxyCODONE-acetaminophen (PERCOCET/ROXICET) 5-325 MG tablet Take 2 tablets by mouth every 6 (six) hours as needed for up to 7 days for moderate pain or severe pain. 20 tablet 0  . polyethylene glycol (MIRALAX / GLYCOLAX)  packet Take 17 g by mouth daily as needed for moderate constipation. 14 each 0  . pravastatin (PRAVACHOL) 20 MG tablet Take 40 mg by mouth daily.     Marland Kitchen senna-docusate (SENOKOT-S) 8.6-50 MG tablet Take 1 tablet by mouth 2 (two) times daily. 30 tablet 0  . sertraline (ZOLOFT) 100 MG tablet Take 100 mg by mouth daily.     Marland Kitchen sulfamethoxazole-trimethoprim (BACTRIM DS,SEPTRA DS) 800-160 MG tablet Take 1 tablet by mouth 2 (two) times daily for 14 days. 28 tablet 0  . traZODone (DESYREL) 50 MG tablet Take 50 mg by mouth at bedtime.    Marland Kitchen VITAMIN E PO Take 180 mg by mouth daily.    . Zinc 50 MG TABS Take 50 mg by mouth daily.     No current facility-administered medications for this visit.      ROS:  See HPI  Physical Exam:    Incision:     Extremities:  Skin is warm and intact, slight separation lateral incision, but no evidence of necrotic tissue.  Palpable AT pulse right LE.  No active bleeding or drainage.  Mild incisional cellulitis.  No edema in the  Right foot or ankle.  Assessment/Plan:  This is a 63 y.o. female who is s/p: Re-do left TMA  We recommend continue therapy for transfer training.  If bleeding occurs change the dressing PRN, other wise it should be changed at least once a day.   She is on oral antibiotics and will continue the course for cellulitis.    F/U in 2-3 weeks for incisional check and possible removal of sutures.    Mosetta Pigeon PA-C [facility] [phone]  Clinic MD:  Myra Gianotti

## 2018-12-02 ENCOUNTER — Ambulatory Visit: Payer: Medicare HMO | Admitting: Physical Therapy

## 2018-12-09 ENCOUNTER — Encounter: Payer: Self-pay | Admitting: Internal Medicine

## 2018-12-09 ENCOUNTER — Other Ambulatory Visit: Payer: Self-pay | Admitting: Internal Medicine

## 2018-12-09 ENCOUNTER — Non-Acute Institutional Stay (SKILLED_NURSING_FACILITY): Payer: Medicare HMO | Admitting: Internal Medicine

## 2018-12-09 DIAGNOSIS — S78112A Complete traumatic amputation at level between left hip and knee, initial encounter: Secondary | ICD-10-CM | POA: Diagnosis not present

## 2018-12-09 DIAGNOSIS — IMO0002 Reserved for concepts with insufficient information to code with codable children: Secondary | ICD-10-CM

## 2018-12-09 DIAGNOSIS — Z9189 Other specified personal risk factors, not elsewhere classified: Secondary | ICD-10-CM

## 2018-12-09 DIAGNOSIS — E1351 Other specified diabetes mellitus with diabetic peripheral angiopathy without gangrene: Secondary | ICD-10-CM

## 2018-12-09 DIAGNOSIS — E1365 Other specified diabetes mellitus with hyperglycemia: Secondary | ICD-10-CM

## 2018-12-09 MED ORDER — OXYCODONE-ACETAMINOPHEN 5-325 MG PO TABS: 1.0000 | ORAL_TABLET | Freq: Four times a day (QID) | ORAL | 0 refills | Status: DC | PRN

## 2018-12-09 NOTE — Assessment & Plan Note (Addendum)
Glucose control improved, serial glucoses 160-250 The critical role of better DM control in wound healing  discussed with her and her nephew.

## 2018-12-09 NOTE — Assessment & Plan Note (Addendum)
Respiratory risk of opiates discussed with her and her nephew Potential hepatotoxic doses of acetaminophen also reviewed Decrease Percocet to 1 every 6 hours prn; vascular surgery will determine long-term need Perhaps she is a candidate for duloxetine in place of the sertraline to enhance neuropathic pain control Pending that decision by her PCP or mental health professional, gabapentin 200 mg added at bedtime

## 2018-12-09 NOTE — Patient Instructions (Signed)
See assessment and plan under each diagnosis in the problem list and acutely for this visit 

## 2018-12-09 NOTE — Progress Notes (Signed)
NURSING HOME LOCATION:  [facility] ROOM NUMBER:  212/A  CODE STATUS:  DNR  PCP: [name], MD  This is a nursing facility follow up for specific acute issue of pain management.  Interim medical record and care since last [facility] visit was updated with review of diagnostic studies and change in clinical status since last visit were documented.  HPI: The patient was admitted to the SNF on 11/27/2018 having been monitored in the ED for postop bleeding from the right foot at an amputation site.  Transmetatarsal amputation of toes 2nd-5th were  completed 2/5 during the hospitalization 2/4-2/15.  She came back to the ED the day of her discharge 2/15 because of the bleeding. She is almost 1 month postop from the amputation but has 2 orders for Percocet 5/325.  One order is 1 pill every 4 hours as needed the other is 2 pills every 4 hours as needed.  This would result in a possible dose of 90 mg of oxycodone which would be equivalent to 135 morphine milliequivalents a day.  Additionally this results in a potential dose of acetaminophen 5800 mg a day, well above the potential hepatotoxic dose of over 4 g daily. The patient was seen by the vascular surgery office 2/24.  Dressing changes was recommended daily and as needed for any bleeding.  She was to complete the course of antibiotics for cellulitis.  Follow-up was to be in 2-3 weeks for possible removal of sutures.  A prescription was proivided at that visit for oxycodone/acetaminophen 5-325 mg 2 tablets every 6 hours as needed for up to 7 days for moderate pain or severe pain. The patient now states that she only takes her pain medicine at night "to calm my body down".  At home she had been taking the oxycodone at 5 and 8 PM. She also describes chronic numbness and tingling in the third-fifth digits of the upper extremities. She had previously been on gabapentin for pain in the left foot but did not find this of value. The patient  has been on high-dose Zoloft, lorazepam, and trazodone.  She has never been on duloxetine.  Review of systems: Glucoses here at the facility have ranged from 160 up to 250.  She denies diabetic related symptoms.  She continues to have the numbness and tingling of the upper extremities in the third through the fifth fingers.  She states that she has itching in the left foot despite the AKA amputation on that side  Constitutional: No fever, significant weight change, fatigue  Eyes: No redness, discharge, pain, vision change ENT/mouth: No nasal congestion,  purulent discharge, earache, change in hearing, sore throat  Cardiovascular: No chest pain, palpitations, paroxysmal nocturnal dyspnea, claudication, edema  Respiratory: No cough, sputum production, hemoptysis, DOE, significant snoring, apnea   Gastrointestinal: No heartburn, dysphagia, abdominal pain, nausea /vomiting, rectal bleeding, melena, change in bowels Genitourinary: No dysuria, hematuria, pyuria, incontinence, nocturia Dermatologic: No rash, pruritus, new change in appearance of skin Neurologic: No dizziness, headache, syncope Psychiatric: No significant anxiety, depression, insomnia, anorexia Endocrine: No change in hair/skin/nails, excessive thirst, excessive hunger, excessive urination  Hematologic/lymphatic: No significant bruising, lymphadenopathy, abnormal bleeding Allergy/immunology: No itchy/watery eyes, significant sneezing, urticaria, angioedema  Physical exam:  Pertinent or positive findings: Affect is flat.  Anisocoria is present with a left pupil larger than the right.  Teeth are coated.  There is suggestion of intermittent asymmetry of the nasolabial folds with a decrease on the left.  She has hirsutism over  the chin.  The right foot is dressed surgically.  Pedal pulses are not palpable.  There is an AKA on the left.  She has an operative scar at the right elbow.  There is a posttraumatic scar over the right upper lateral  arm.  She has an eschar over the upper chest which she relates to scratching.  eneral appearance:  no acute distress, increased work of breathing is present.   Lymphatic: No lymphadenopathy about the head, neck, axilla. Eyes: No conjunctival inflammation or lid edema is present. There is no scleral icterus. Ears:  External ear exam shows no significant lesions or deformities.   Nose:  External nasal examination shows no deformity or inflammation. Nasal mucosa are pink and moist without lesions, exudates Oral exam:  Lips and gums are healthy appearing. There is no oropharyngeal erythema or exudate. Neck:  No thyromegaly, masses, tenderness noted.    Heart:  Normal rate and regular rhythm. S1 and S2 normal without gallop, murmur, click, rub .  Lungs: Chest clear to auscultation without wheezes, rhonchi, rales, rubs. Abdomen: Bowel sounds are normal. Abdomen is soft and nontender with no organomegaly, hernias, masses. GU: Deferred  Extremities:  No cyanosis, clubbing, edema  Neurologic exam : Balance, Rhomberg, finger to nose testing could not be completed due to clinical state Skin: Warm & dry w/o tenting. No significant  rash.  See summary under each active problem in the Problem List with associated updated therapeutic plan

## 2018-12-09 NOTE — Assessment & Plan Note (Signed)
Vascular surgery follow-up 12/22/2018

## 2018-12-09 NOTE — Progress Notes (Signed)
Rx sent to [facility] 

## 2018-12-11 ENCOUNTER — Ambulatory Visit (INDEPENDENT_AMBULATORY_CARE_PROVIDER_SITE_OTHER): Payer: Medicare HMO | Admitting: Infectious Disease

## 2018-12-11 ENCOUNTER — Encounter: Payer: Self-pay | Admitting: Infectious Disease

## 2018-12-11 VITALS — BP 118/75 | HR 83 | Temp 98.1°F | Wt 247.0 lb

## 2018-12-11 DIAGNOSIS — E1365 Other specified diabetes mellitus with hyperglycemia: Secondary | ICD-10-CM | POA: Diagnosis not present

## 2018-12-11 DIAGNOSIS — E1351 Other specified diabetes mellitus with diabetic peripheral angiopathy without gangrene: Secondary | ICD-10-CM

## 2018-12-11 DIAGNOSIS — M869 Osteomyelitis, unspecified: Secondary | ICD-10-CM

## 2018-12-11 DIAGNOSIS — I739 Peripheral vascular disease, unspecified: Secondary | ICD-10-CM

## 2018-12-11 DIAGNOSIS — IMO0002 Reserved for concepts with insufficient information to code with codable children: Secondary | ICD-10-CM

## 2018-12-11 DIAGNOSIS — T8740 Infection of amputation stump, unspecified extremity: Secondary | ICD-10-CM

## 2018-12-11 NOTE — Progress Notes (Signed)
Subjective:    Patient ID: Danielle Buchanan, female    DOB: August 12, 1956, 63 y.o.   MRN: [id number]  HPI  63 y/o who has osteomyelitis of the 3rd toe s/p transmetatarsal amputation of toes 2-5 and vascular intervention in the setting of poorly controlled diabetes and peripheral artery disease. There was concern that margins from surgery were not completely clean. Surgical cultures were positive for Serratia marcescens.   He was taken back to the operating room with vascular surgery who performed a more proximal amputation which was thought to have clean margins.  Because of the better margins were achieved I was fine with her changing to oral Bactrim and she is completed a course of therapy with this.  Been off antibiotics for several weeks and has been seen by vascular surgery and primary care.  There are some areas on the medial and distal aspects of her wound that have darkened black tissue which concerns me.  She is going to see vascular fairly soon.     Past Medical History:  Diagnosis Date  . Allergic rhinitis   . Depression   . Diabetes (HCC)   . Dyslipidemia   . HTN (hypertension)   . Insomnia   . OSA (obstructive sleep apnea)    no cpap  . Urinary incontinence     Past Surgical History:  Procedure Laterality Date  . ABDOMINAL AORTOGRAM W/LOWER EXTREMITY N/A 06/24/2017   Procedure: ABDOMINAL AORTOGRAM W/LOWER EXTREMITY;  Surgeon: Maeola Harmanain, Brandon Christopher, MD;  [facility];  Service: Cardiovascular;  Laterality: N/A;  . ABDOMINAL AORTOGRAM W/LOWER EXTREMITY Right 10/09/2017   Procedure: ABDOMINAL AORTOGRAM W/LOWER EXTREMITY;  Surgeon: Maeola Harmanain, Brandon Christopher, MD;  [facility];  Service: Cardiovascular;  Laterality: Right;  . AMPUTATION Left 07/23/2017   Procedure: AMPUTATION  BELOW KNEE;  Surgeon: Maeola Harmanain, Brandon Christopher, MD;  [facility];  Service: Vascular;  Laterality: Left;  . AMPUTATION Left 08/12/2017   Procedure: REVISION  BELOW KNEE;  Surgeon: Maeola Harmanain, Brandon Christopher, MD;  [facility];  Service: Vascular;  Laterality: Left;  . AMPUTATION Left 08/27/2017   Procedure: left ABOVE KNEE AMPUTATION;  Surgeon: Maeola Harmanain, Brandon Christopher, MD;  [facility];  Service: Vascular;  Laterality: Left;  . AMPUTATION Left 08/30/2017   Procedure: REVISION AMPUTATION ABOVE KNEE;  Surgeon: Nada LibmanBrabham, Vance W, MD;  [facility];  Service: Vascular;  Laterality: Left;  . AMPUTATION Right 11/04/2017   Procedure: AMPUTATION RIGHT FOURTH TOE;  Surgeon: Maeola Harmanain, Brandon Christopher, MD;  [facility];  Service: Vascular;  Laterality: Right;  . AMPUTATION Right 11/12/2018   Procedure: AMPUTATION OF second, third and fifth toes;  Surgeon: Nada LibmanBrabham, Vance W, MD;  [facility];  Service: Vascular;  Laterality: Right;  . APPLICATION OF WOUND VAC Left 08/27/2017   Procedure: APPLICATION OF WOUND VAC;  Surgeon: Maeola Harmanain, Brandon Christopher, MD;  [facility];  Service: Vascular;  Laterality: Left;  . CARPAL TUNNEL RELEASE Right   . COLONOSCOPY    . LOWER EXTREMITY ANGIOGRAM Right 11/12/2018   Procedure: ANGIOGRAM OF Right LEG;  Surgeon: Nada LibmanBrabham, Vance W, MD;  [facility];  Service: Vascular;  Laterality: Right;  . LOWER EXTREMITY INTERVENTION Right 10/09/2017   Procedure: LOWER EXTREMITY INTERVENTION;  Surgeon: Maeola Harmanain, Brandon Christopher, MD;  [facility];  Service: Cardiovascular;  Laterality: Right;  . TOE AMPUTATION Right 11/12/2018   2ND 3RD & 5TH TOE   . TRANSMETATARSAL AMPUTATION Right 11/19/2018   Procedure:  TRANSMETATARSAL AMPUTATION REVISION;  Surgeon: Nada Libman, MD;  Location: [facility] OR;  Service: Vascular;  Laterality: Right;    Family History  Problem Relation Age of Onset  . Heart attack Mother   . Alcohol abuse Mother   . Heart attack Father   . Alcohol abuse Father   . Heart attack Sister   . Heart attack Brother   . Hypercholesterolemia Sister   . Diabetes Neg Hx       Social History    Socioeconomic History  . Marital status: Single    Spouse name: Not on file  . Number of children: Not on file  . Years of education: Not on file  . Highest education level: Not on file  Occupational History  . Not on file  Social Needs  . Financial resource strain: Not on file  . Food insecurity:    Worry: Not on file    Inability: Not on file  . Transportation needs:    Medical: Not on file    Non-medical: Not on file  Tobacco Use  . Smoking status: Former Smoker    Packs/day: 1.00    Years: 51.00    Pack years: 51.00    Types: Cigarettes    Last attempt to quit: 08/08/2018    Years since quitting: 0.3  . Smokeless tobacco: Never Used  Substance and Sexual Activity  . Alcohol use: No    Alcohol/week: 0.0 standard drinks  . Drug use: No  . Sexual activity: Not on file  Lifestyle  . Physical activity:    Days per week: Not on file    Minutes per session: Not on file  . Stress: Not on file  Relationships  . Social connections:    Talks on phone: Not on file    Gets together: Not on file    Attends religious service: Not on file    Active member of club or organization: Not on file    Attends meetings of clubs or organizations: Not on file    Relationship status: Not on file  Other Topics Concern  . Not on file  Social History Narrative   Lives with sister in an apartment on the first floor.  Has no children.    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    Education: 3rd grade.    Allergies  Allergen Reactions  . Milk-Related Compounds Other (See Comments)    incontinence  . Doxycycline Rash     Current Outpatient Medications:  .  amitriptyline (ELAVIL) 25 MG tablet, Take 25 mg by mouth at bedtime., Disp: , Rfl:  .  aspirin EC 81 MG EC tablet, Take 1 tablet (81 mg total) by mouth daily., Disp: 30 tablet, Rfl: 0 .  clopidogrel (PLAVIX) 75 MG tablet, Take 1 tablet (75 mg total) by mouth daily with breakfast., Disp: 30  tablet, Rfl: 0 .  Insulin NPH Isophane & Regular (HUMULIN 70/30 KWIKPEN [place]), Inject 70 Units into the skin 2 (two) times daily. With  breakfast and dinner. Hold for CBG less than 60. Notify MD if CBG greater than 300 (DM), Disp: , Rfl:  .  LORazepam (ATIVAN) 0.5 MG tablet, Take 1 tablet (0.5 mg total) by mouth 2 (two) times daily as needed for anxiety., Disp: 10 tablet, Rfl: 0 .  losartan (COZAAR) 100 MG tablet, Take 100 mg by mouth daily., Disp: , Rfl:  .  Multiple Vitamin (MULTIVITAMIN WITH MINERALS) TABS tablet, Place  1 tablet into feeding tube daily., Disp: 30 tablet, Rfl: 0 .  nicotine polacrilex (NICORELIEF) 2 MG gum, Take 2 mg by mouth daily as needed for smoking cessation. , Disp: , Rfl:  .  oxyCODONE-acetaminophen (PERCOCET/ROXICET) 5-325 MG tablet, Take 1 tablet by mouth every 6 (six) hours as needed for severe pain., Disp: 28 tablet, Rfl: 0 .  polyethylene glycol (MIRALAX / GLYCOLAX) packet, Take 17 g by mouth daily as needed for moderate constipation., Disp: 14 each, Rfl: 0 .  pravastatin (PRAVACHOL) 20 MG tablet, Take 40 mg by mouth daily. , Disp: , Rfl:  .  senna-docusate (SENOKOT-S) 8.6-50 MG tablet, Take 1 tablet by mouth 2 (two) times daily., Disp: 30 tablet, Rfl: 0 .  sertraline (ZOLOFT) 100 MG tablet, Take 100 mg by mouth daily. , Disp: , Rfl:  .  traZODone (DESYREL) 50 MG tablet, Take 50 mg by mouth at bedtime., Disp: , Rfl:  .  VITAMIN E PO, Take 180 mg by mouth daily., Disp: , Rfl:  .  Zinc 50 MG TABS, Take 50 mg by mouth daily., Disp: , Rfl:    Review of Systems  Constitutional: Negative for activity change, appetite change, chills, diaphoresis, fatigue, fever and unexpected weight change.  HENT: Negative for congestion, rhinorrhea, sinus pressure, sneezing, sore throat and trouble swallowing.   Eyes: Negative for photophobia and visual disturbance.  Respiratory: Negative for cough, chest tightness, shortness of breath, wheezing and stridor.   Cardiovascular: Negative for  chest pain, palpitations and leg swelling.  Gastrointestinal: Negative for abdominal distention, abdominal pain, anal bleeding, blood in stool, constipation, diarrhea, nausea and vomiting.  Genitourinary: Negative for difficulty urinating, dysuria, flank pain and hematuria.  Musculoskeletal: Negative for arthralgias, back pain, gait problem, joint swelling and myalgias.  Skin: Positive for color change and wound. Negative for pallor and rash.  Neurological: Negative for dizziness, tremors, weakness and light-headedness.  Hematological: Negative for adenopathy. Does not bruise/bleed easily.  Psychiatric/Behavioral: Negative for agitation, behavioral problems, confusion, decreased concentration, dysphoric mood and sleep disturbance.       Objective:   Physical Exam Constitutional:      General: She is not in acute distress.    Appearance: Normal appearance. She is well-developed. She is not ill-appearing or diaphoretic.  HENT:     Head: Normocephalic and atraumatic.     Right Ear: Hearing and external ear normal.     Left Ear: Hearing and external ear normal.     Nose: No nasal deformity or rhinorrhea.  Eyes:     General: No scleral icterus.    Conjunctiva/sclera: Conjunctivae normal.     Right eye: Right conjunctiva is not injected.     Left eye: Left conjunctiva is not injected.     Pupils: Pupils are equal, round, and reactive to light.  Neck:     Musculoskeletal: Normal range of motion and neck supple.     Vascular: No JVD.  Cardiovascular:     Rate and Rhythm: Normal rate and regular rhythm.     Heart sounds: S1 normal and S2 normal.  Pulmonary:     Effort: Pulmonary effort is normal. No respiratory distress.     Breath sounds: No wheezing.  Abdominal:     General: There is no distension.     Palpations: Abdomen is soft.  Musculoskeletal: Normal range of motion.     Right shoulder: Normal.     Left shoulder: Normal.     Right hip: Normal.     Left hip:  Normal.     Right  knee: Normal.     Left knee: Normal.  Lymphadenopathy:     Head:     Right side of head: No submandibular, preauricular or posterior auricular adenopathy.     Left side of head: No submandibular, preauricular or posterior auricular adenopathy.     Cervical: No cervical adenopathy.     Right cervical: No superficial or deep cervical adenopathy.    Left cervical: No superficial or deep cervical adenopathy.  Skin:    General: Skin is warm and dry.     Coloration: Skin is not pale.     Findings: Erythema present. No abrasion, bruising, ecchymosis, lesion or rash.     Nails: There is no clubbing.   Neurological:     Mental Status: She is alert and oriented to person, place, and time.     Sensory: No sensory deficit.     Coordination: Coordination normal.     Gait: Gait normal.  Psychiatric:        Attention and Perception: She is attentive.        Mood and Affect: Mood normal.        Speech: Speech normal.        Behavior: Behavior normal. Behavior is cooperative.        Thought Content: Thought content normal.        Judgment: Judgment normal.     Right lower extremity amputation site December 11, 2018:              Assessment & Plan:   Osteomyelitis of third metatarsal status post 2 surgeries with Serratia have being isolated on culture status post ceftriaxone then Bactrim:  There are some areas of darkening around the edges of the wound there is concern he may a bit. I will check some blood blood work today  He is to see vascular surgeon again fairly soon along with primary care and I will see her back myself in  1-2 months if she is making nice progression.

## 2018-12-12 ENCOUNTER — Encounter: Payer: Self-pay | Admitting: Internal Medicine

## 2018-12-12 ENCOUNTER — Non-Acute Institutional Stay (SKILLED_NURSING_FACILITY): Payer: Medicare HMO | Admitting: Internal Medicine

## 2018-12-12 DIAGNOSIS — E1365 Other specified diabetes mellitus with hyperglycemia: Secondary | ICD-10-CM

## 2018-12-12 DIAGNOSIS — I739 Peripheral vascular disease, unspecified: Secondary | ICD-10-CM

## 2018-12-12 DIAGNOSIS — E1351 Other specified diabetes mellitus with diabetic peripheral angiopathy without gangrene: Secondary | ICD-10-CM | POA: Diagnosis not present

## 2018-12-12 DIAGNOSIS — D62 Acute posthemorrhagic anemia: Secondary | ICD-10-CM | POA: Diagnosis not present

## 2018-12-12 DIAGNOSIS — IMO0002 Reserved for concepts with insufficient information to code with codable children: Secondary | ICD-10-CM

## 2018-12-12 DIAGNOSIS — Z89431 Acquired absence of right foot: Secondary | ICD-10-CM | POA: Diagnosis not present

## 2018-12-12 NOTE — Progress Notes (Addendum)
Location:    [facility] Room Number: 212/A Place of Service:  SNF [phone])  Provider: Edmon Crape PA-C  PCP: [name], MD Patient Care Team: [name], MD as PCP - General (Family Medicine)  Extended Emergency Contact Information Primary Emergency Contact: Jens Som of [place] Mobile Phone: [phone] Relation: Nephew Secondary Emergency Contact: Richardson,Lisa Address: [address]          [place], [place] [address] [place] Home Phone: [phone] Relation: Sister  Code Status: DNR Goals of care:  Advanced Directive information Advanced Directives 12/12/2018  Does Patient Have a Medical Advance Directive? Yes  Type of Advance Directive Out of facility DNR (pink MOST or yellow form)  Does patient want to make changes to medical advance directive? No - Patient declined  Copy of Healthcare Power of Attorney in Chart? -  Would patient like information on creating a medical advance directive? No - Patient declined     Allergies  Allergen Reactions  . Milk-Related Compounds Other (See Comments)    incontinence  . Doxycycline Rash    Chief Complaint  Patient presents with  . Discharge Note    Discharge Visit    HPI:  63 y.o. female seen today for discharge from facility early next week.  Patient was here for rehab after undergoing a trans-metatarsal amputation of the right foot.  Apparently she did go home after initial discharge but had some postop bleeding and went back to the ER she also apparently had significant pain.  The amputation was of her toes second through fifth and was done on February 5 and she was discharged from the hospital on 15 February   But apparently presented to the ER later in the day with bleeding.  She was seen by vascular surgery on February 24 dressing change was recommended daily- and she was to complete a course of antibiotics for  cellulitis.  Which she has completed.  Marland Kitchen  She was seen by infectious disease physician yesterday he did note some darkening of the edges around the wound and did order lab work which is pending.  She has been afebrile she is not complaining of fever or chills-continues to have pain at times apparently her Percocet is helping she actually still has 2 orders 145 325 mg 1 tab every 6 hours as needed another for 2 tabs every 4 hours as needed severe pain- will discontinue the Percocet every 4 hours as needed per discussion with Dr. Alwyn Ren.  Apparently she does take the Percocet at times and does receive relief.  Her other diagnoses include type 2 diabetes she is on Humulin 70- 30--70 units twice daily blood sugars appear to run more in the 100s-200s range somewhat elevated but since she is about to go home would be hesitant to make significant increases with risk of hypotension but will need expedient follow-up by her primary care provider to adjust  She also has a history of hypertension she is on losartan this appears relatively well controlled blood pressure is 132/76 today.  She also has a history of depression she is on Zoloft as well as amitriptyline and this appears stable she appears to be in good spirits and is looking forward to going home.  She has been started on on Neurontin 200 mg nightly for neuropathy as well.  Currently she is lying in bed appears to be comfortable continues to be very pleasant-again is looking  forward to going home- she will need PT OT home health as well as the nursing to otherwise monitor her wound she will need vascular surgery follow-up as well.          Past Medical History:  Diagnosis Date  . Allergic rhinitis   . Depression   . Diabetes (HCC)   . Dyslipidemia   . HTN (hypertension)   . Insomnia   . OSA (obstructive sleep apnea)    no cpap  . Urinary incontinence     Past Surgical History:  Procedure Laterality Date  . ABDOMINAL  AORTOGRAM W/LOWER EXTREMITY N/A 06/24/2017   Procedure: ABDOMINAL AORTOGRAM W/LOWER EXTREMITY;  Surgeon: Maeola Harman, MD;  Location: [facility] INVASIVE CV LAB;  Service: Cardiovascular;  Laterality: N/A;  . ABDOMINAL AORTOGRAM W/LOWER EXTREMITY Right 10/09/2017   Procedure: ABDOMINAL AORTOGRAM W/LOWER EXTREMITY;  Surgeon: Maeola Harman, MD;  Location: [facility] INVASIVE CV LAB;  Service: Cardiovascular;  Laterality: Right;  . AMPUTATION Left 07/23/2017   Procedure: AMPUTATION  BELOW KNEE;  Surgeon: Maeola Harman, MD;  Location: [facility] OR;  Service: Vascular;  Laterality: Left;  . AMPUTATION Left 08/12/2017   Procedure: REVISION BELOW KNEE;  Surgeon: Maeola Harman, MD;  Location: [facility] OR;  Service: Vascular;  Laterality: Left;  . AMPUTATION Left 08/27/2017   Procedure: left ABOVE KNEE AMPUTATION;  Surgeon: Maeola Harman, MD;  Location: [facility] OR;  Service: Vascular;  Laterality: Left;  . AMPUTATION Left 08/30/2017   Procedure: REVISION AMPUTATION ABOVE KNEE;  Surgeon: Nada Libman, MD;  Location: [facility] OR;  Service: Vascular;  Laterality: Left;  . AMPUTATION Right 11/04/2017   Procedure: AMPUTATION RIGHT FOURTH TOE;  Surgeon: Maeola Harman, MD;  Location: [facility] OR;  Service: Vascular;  Laterality: Right;  . AMPUTATION Right 11/12/2018   Procedure: AMPUTATION OF second, third and fifth toes;  Surgeon: Nada Libman, MD;  Location: [facility] OR;  Service: Vascular;  Laterality: Right;  . APPLICATION OF WOUND VAC Left 08/27/2017   Procedure: APPLICATION OF WOUND VAC;  Surgeon: Maeola Harman, MD;  Location: [facility] OR;  Service: Vascular;  Laterality: Left;  . CARPAL TUNNEL RELEASE Right   . COLONOSCOPY    . LOWER EXTREMITY ANGIOGRAM Right 11/12/2018   Procedure: ANGIOGRAM OF Right LEG;  Surgeon: Nada Libman, MD;  Location: [facility] OR;  Service: Vascular;  Laterality: Right;  . LOWER EXTREMITY INTERVENTION Right 10/09/2017   Procedure: LOWER EXTREMITY  INTERVENTION;  Surgeon: Maeola Harman, MD;  Location: [facility] INVASIVE CV LAB;  Service: Cardiovascular;  Laterality: Right;  . TOE AMPUTATION Right 11/12/2018   2ND 3RD & 5TH TOE   . TRANSMETATARSAL AMPUTATION Right 11/19/2018   Procedure: TRANSMETATARSAL AMPUTATION REVISION;  Surgeon: Nada Libman, MD;  Location: [facility] OR;  Service: Vascular;  Laterality: Right;      reports that she quit smoking about 4 months ago. Her smoking use included cigarettes. She has a 51.00 pack-year smoking history. She has never used smokeless tobacco. She reports that she does not drink alcohol or use drugs. Social History   Socioeconomic History  . Marital status: Single    Spouse name: Not on file  . Number of children: Not on file  . Years of education: Not on file  . Highest education level: Not on file  Occupational History  . Not on file  Social Needs  . Financial resource strain: Not on file  . Food insecurity:    Worry: Not on file  Inability: Not on file  . Transportation needs:    Medical: Not on file    Non-medical: Not on file  Tobacco Use  . Smoking status: Former Smoker    Packs/day: 1.00    Years: 51.00    Pack years: 51.00    Types: Cigarettes    Last attempt to quit: 08/08/2018    Years since quitting: 0.3  . Smokeless tobacco: Never Used  Substance and Sexual Activity  . Alcohol use: No    Alcohol/week: 0.0 standard drinks  . Drug use: No  . Sexual activity: Not on file  Lifestyle  . Physical activity:    Days per week: Not on file    Minutes per session: Not on file  . Stress: Not on file  Relationships  . Social connections:    Talks on phone: Not on file    Gets together: Not on file    Attends religious service: Not on file    Active member of club or organization: Not on file    Attends meetings of clubs or organizations: Not on file    Relationship status: Not on file  . Intimate partner violence:    Fear of current or ex partner: Not on file     Emotionally abused: Not on file    Physically abused: Not on file    Forced sexual activity: Not on file  Other Topics Concern  . Not on file  Social History Narrative   Lives with sister in an apartment on the first floor.  Has no children.    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    Education: 3rd grade.   Functional Status Survey:    Allergies  Allergen Reactions  . Milk-Related Compounds Other (See Comments)    incontinence  . Doxycycline Rash    Pertinent  Health Maintenance Due  Topic Date Due  . PAP SMEAR-Modifier  01/09/2019 (Originally 12/17/1976)  . OPHTHALMOLOGY EXAM  01/09/2019 (Originally 12/17/1965)  . COLONOSCOPY  01/09/2019 (Originally 12/17/2005)  . FOOT EXAM  03/12/2019  . HEMOGLOBIN A1C  05/13/2019  . MAMMOGRAM  01/11/2020  . INFLUENZA VACCINE  Completed    Medications: Allergies as of 12/12/2018      Reactions   Milk-related Compounds Other (See Comments)   incontinence   Doxycycline Rash      Medication List       Accurate as of December 12, 2018  2:58 PM. Always use your most recent med list.        amitriptyline 25 MG tablet Commonly known as:  ELAVIL Take 25 mg by mouth at bedtime.   aspirin 81 MG EC tablet Take 1 tablet (81 mg total) by mouth daily.   clopidogrel 75 MG tablet Commonly known as:  PLAVIX Take 1 tablet (75 mg total) by mouth daily with breakfast.   gabapentin 100 MG capsule Commonly known as:  NEURONTIN Take 200 mg by mouth at bedtime.   HUMULIN 70/30 KWIKPEN [place] Inject 70 Units into the skin 2 (two) times daily. With  breakfast and dinner. Hold for CBG less than 60. Notify MD if CBG greater than 300 (DM)   LORazepam 0.5 MG tablet Commonly known as:  ATIVAN Take 1 tablet (0.5 mg total) by mouth 2 (two) times daily as needed for anxiety.   losartan 100 MG tablet Commonly known as:  COZAAR Take 100 mg by mouth daily.   multivitamin with minerals Tabs tablet Place 1  tablet  into feeding tube daily.   Nicorelief 2 MG gum Generic drug:  nicotine polacrilex Take 2 mg by mouth daily as needed for smoking cessation.   oxyCODONE-acetaminophen 5-325 MG tablet Commonly known as:  PERCOCET/ROXICET Take 1 tablet by mouth every 6 (six) hours as needed for severe pain.   polyethylene glycol packet Commonly known as:  MIRALAX / GLYCOLAX Take 17 g by mouth daily as needed for moderate constipation.   pravastatin 20 MG tablet Commonly known as:  PRAVACHOL Take 40 mg by mouth daily.   senna-docusate 8.6-50 MG tablet Commonly known as:  Senokot-S Take 1 tablet by mouth 2 (two) times daily.   sertraline 100 MG tablet Commonly known as:  ZOLOFT Take 100 mg by mouth daily.   traZODone 50 MG tablet Commonly known as:  DESYREL Take 50 mg by mouth at bedtime.   VITAMIN E PO Take 180 mg by mouth daily.   Zinc 50 MG Tabs Take 50 mg by mouth daily.       Review of Systems   General she is not complaining of any fever or chills.  Skin does not complain of rashes or itching her right foot amputation site is currently covered per nursing appears to be stable but will need expedient follow-up vascular surgery and was seen by infectious disease physician yesterday.  Head ears eyes nose mouth and throat does not complain of sore throat or visual changes.  Respiratory does not complain of shortness of breath or cough.  Cardiac does not complain of chest pain.  GI is not complaining of abdominal discomfort nausea vomiting diarrhea or constipation.  GU does not complain of dysuria.  Musculoskeletal continues at times to have some pain more so of her right lower extremity- the Percocet apparently does help.  Neurologic does not complain of dizziness or headache does have some evidence of neuropathy and has been started on Neurontin.  Psych does not complain of overt depression or anxiety she does continue on amitriptyline as well as Zoloft.    Vitals:    12/12/18 1254  BP: 132/76  Pulse: 84  Resp: 20  Temp: 98 F (36.7 C)  TempSrc: Oral    Physical Exam   In general this is a pleasant female in no distress lying comfortably in bed.  Her skin is warm and dry.  Oropharynx she does have some food residue on her tongue mucous membranes appear moist.  Chest is clear to auscultation there is no labored breathing.  Heart is regular rate and rhythm without murmur gallop or rub.  Abdomen is soft nontender with positive bowel sounds.  Musculoskeletal is status post left above-the-knee amputation-she does have dressing over her right foot amputation site.  Neurologic is grossly intact her speech is clear without lateralizing findings.  Psych she is alert and oriented pleasant and appropriate  Labs reviewed: Basic Metabolic Panel: Recent Labs    11/20/18 0414 11/21/18 0416 11/22/18 0452  NA 137 137 137  K 3.8 4.0 4.0  CL 101 102 104  CO2 25 26 27   GLUCOSE 231* 201* 206*  BUN 14 11 10   CREATININE 0.78 0.78 0.66  CALCIUM 8.4* 8.9 8.6*  MG 1.6* 1.6* 1.6*  PHOS 4.1 4.1 4.4   Liver Function Tests: Recent Labs    11/20/18 0414 11/21/18 0416 11/22/18 0452  AST 12* 14* 13*  ALT 15 16 15   ALKPHOS 56 54 55  BILITOT 0.4 0.4 0.3  PROT 6.0* 6.2* 5.9*  ALBUMIN 2.6* 2.8*  2.8*   No results for input(s): LIPASE, AMYLASE in the last 8760 hours. No results for input(s): AMMONIA in the last 8760 hours. CBC: Recent Labs    11/20/18 0414 11/21/18 0416 11/22/18 0452  WBC 11.2* 8.8 8.1  NEUTROABS 7.5 5.5 5.5  HGB 10.3* 11.0* 11.1*  HCT 33.3* 34.6* 35.4*  MCV 88.6 88.7 88.1  PLT 308 325 301   Cardiac Enzymes: No results for input(s): CKTOTAL, CKMB, CKMBINDEX, TROPONINI in the last 8760 hours. BNP: Invalid input(s): POCBNP CBG: Recent Labs    11/26/18 1746 11/26/18 2129 11/27/18 0757  GLUCAP 221* 314* 291*    Procedures and Imaging Studies During Stay: [place] Ekg Site Rite  Result Date: 11/14/2018 If Site Rite image  not attached, placement could not be confirmed due to current cardiac rhythm.  [place] Ekg Site Rite  Result Date: 11/14/2018 If [facility] image not attached, placement could not be confirmed due to current cardiac rhythm.   Assessment/Plan:    #1 history of transmetatarsal amputation right foot- she has been followed by infectious disease as well as vascular surgery will need follow-up- again she will need continued PT and OT at home as well as strong home health support for her multiple medical issues and wound care.  He does have strong family support from her nephew and her nephew's wife.  For pain management again will simplify Percocet order to every 6 hours 1 tab 5 325 mg as needed-She continues to complain of frequent pain and says the Percocet helps and she tolerates it well  2.  History of type 2 diabetes she is on Humulin 70-30--currently 70 units twice daily blood sugars appear to run more from the mid 100s to mid 200s recently-since she is about to go home would be hesitant to be aggressive increasing her dose-we will defer to primary care provider-would like to avoid hypoglycemia.   3.  History of hypertension this appears relatively well controlled on losartan.  4.  Depression--this appears stable on Zoloft 100 mg a day and amitriptyline 25 mg nightly-.  5.  History of anxiety she does have an order for Ativan 0.5 mg twice daily as needed at this point appears to be stable.  6.-  History of insomnia she is on trazodone 50 mg nightly this appears to be well-tolerated.  7.  History of neuropathy most likely diabetic related she has been started on Neurontin 200 mg nightly.  8.  History of hyperlipidemia she continues on pravastatin 40 mg a day.  9.  History of peripheral vascular disease she is on aspirin 81 mg a day as well as Plavix 75 mg a day- again she has been followed by vascular surgery.  10.  History of anemia hemoglobin was 11.1 on lab done February 15- this will need  monitoring apparently update labs have been ordered by infectious disease  Again she will be going home she does have a supportive nephew who helps her out-she will need extensive PT and OT as well as follow-up by vascular surgery as well as infectious disease as needed.  She will have home health support with nursing support for multiple medical issues and wound care management- as well as PT and OT.  We will discharge her with a limited supply of Percocet 5-325 mg 1 tab every 6 hours as needed with no refills with follow-up by primary care provider and surgery.  [id number] note greater than 30 minutes spent on this discharge summary- greater than 50% of time spent  coordinating a plan of care for numerous diagnoses   ADDENDUM--done- on date of discharge which was Monday, March 9 ----- we have obtain the updated labs from infectious disease shows improvement of the hemoglobin up to 12.5-her white count is normal at 4.9 platelets are 154.  Metabolic panel shows a sodium of 136 potassium 4.6 BUN of 17 and creatinine of 0.64.  Sed rate is mildly elevated at 35- C-reactive protein is mildly elevated at 0.84- will have staff notify infectious disease of these labs since they did order them  Of note I did speak with nurse this morning and apparently her Humulin 70/30 was held this morning because blood sugar was in the mid 100s- will reduce her evening dose to 60 units from 70 units-this will need follow-up by primary care provider. I also spoke with the pharmacist at her pharmacy about the change in insulin since she had already left the facility today but had not picked up her medications yet

## 2018-12-13 ENCOUNTER — Encounter: Payer: Self-pay | Admitting: Internal Medicine

## 2018-12-13 DIAGNOSIS — Z89431 Acquired absence of right foot: Secondary | ICD-10-CM | POA: Insufficient documentation

## 2018-12-15 MED ORDER — LOSARTAN POTASSIUM 100 MG PO TABS: 100.0000 mg | ORAL_TABLET | Freq: Every day | ORAL | 0 refills | Status: DC

## 2018-12-15 MED ORDER — PRAVASTATIN SODIUM 20 MG PO TABS: 40.0000 mg | ORAL_TABLET | Freq: Every day | ORAL | 0 refills | Status: DC

## 2018-12-15 MED ORDER — POLYETHYLENE GLYCOL 3350 17 G PO PACK: 17.0000 g | PACK | Freq: Every day | ORAL | 0 refills | Status: AC | PRN

## 2018-12-15 MED ORDER — VITAMIN E 180 MG PO CAPS: 180.0000 mg | ORAL_CAPSULE | Freq: Every day | ORAL | 0 refills | Status: AC

## 2018-12-15 MED ORDER — LORAZEPAM 0.5 MG PO TABS: 0.5000 mg | ORAL_TABLET | Freq: Two times a day (BID) | ORAL | 0 refills | Status: DC | PRN

## 2018-12-15 MED ORDER — SENNOSIDES-DOCUSATE SODIUM 8.6-50 MG PO TABS: 1.0000 | ORAL_TABLET | Freq: Two times a day (BID) | ORAL | 0 refills | Status: DC

## 2018-12-15 MED ORDER — GABAPENTIN 100 MG PO CAPS: 200.0000 mg | ORAL_CAPSULE | Freq: Every day | ORAL | 0 refills | Status: DC

## 2018-12-15 MED ORDER — SERTRALINE HCL 100 MG PO TABS: 100.0000 mg | ORAL_TABLET | Freq: Every day | ORAL | 0 refills | Status: DC

## 2018-12-15 MED ORDER — ZINC 50 MG PO TABS: 50.0000 mg | ORAL_TABLET | Freq: Every day | ORAL | 0 refills | Status: AC

## 2018-12-15 MED ORDER — ASPIRIN 81 MG PO TBEC: 81.0000 mg | DELAYED_RELEASE_TABLET | Freq: Every day | ORAL | 0 refills | Status: DC

## 2018-12-15 MED ORDER — OXYCODONE-ACETAMINOPHEN 5-325 MG PO TABS: 1.0000 | ORAL_TABLET | Freq: Four times a day (QID) | ORAL | 0 refills | Status: DC | PRN

## 2018-12-15 MED ORDER — TRAZODONE HCL 50 MG PO TABS: 50.0000 mg | ORAL_TABLET | Freq: Every day | ORAL | 0 refills | Status: DC

## 2018-12-15 MED ORDER — INSULIN ISOPHANE & REGULAR (HUMAN 70-30)100 UNIT/ML KWIKPEN: 70.0000 [IU] | PEN_INJECTOR | Freq: Two times a day (BID) | SUBCUTANEOUS | 0 refills | Status: DC

## 2018-12-15 MED ORDER — NICOTINE POLACRILEX 2 MG MT GUM: 2.0000 mg | CHEWING_GUM | Freq: Every day | OROMUCOSAL | 0 refills | Status: AC | PRN

## 2018-12-15 MED ORDER — CLOPIDOGREL BISULFATE 75 MG PO TABS: 75.0000 mg | ORAL_TABLET | Freq: Every day | ORAL | 0 refills | Status: DC

## 2018-12-15 MED ORDER — AMITRIPTYLINE HCL 25 MG PO TABS: 25.0000 mg | ORAL_TABLET | Freq: Every day | ORAL | 0 refills | Status: DC

## 2018-12-16 ENCOUNTER — Telehealth: Payer: Self-pay | Admitting: Podiatry

## 2018-12-16 DIAGNOSIS — Z4781 Encounter for orthopedic aftercare following surgical amputation: Secondary | ICD-10-CM | POA: Diagnosis not present

## 2018-12-16 DIAGNOSIS — I10 Essential (primary) hypertension: Secondary | ICD-10-CM | POA: Diagnosis not present

## 2018-12-16 DIAGNOSIS — I70209 Unspecified atherosclerosis of native arteries of extremities, unspecified extremity: Secondary | ICD-10-CM | POA: Diagnosis not present

## 2018-12-16 DIAGNOSIS — Z7984 Long term (current) use of oral hypoglycemic drugs: Secondary | ICD-10-CM | POA: Diagnosis not present

## 2018-12-16 DIAGNOSIS — E1151 Type 2 diabetes mellitus with diabetic peripheral angiopathy without gangrene: Secondary | ICD-10-CM | POA: Diagnosis not present

## 2018-12-16 DIAGNOSIS — E1142 Type 2 diabetes mellitus with diabetic polyneuropathy: Secondary | ICD-10-CM | POA: Diagnosis not present

## 2018-12-16 DIAGNOSIS — Z4800 Encounter for change or removal of nonsurgical wound dressing: Secondary | ICD-10-CM | POA: Diagnosis not present

## 2018-12-16 DIAGNOSIS — Z89612 Acquired absence of left leg above knee: Secondary | ICD-10-CM | POA: Diagnosis not present

## 2018-12-16 DIAGNOSIS — Z89421 Acquired absence of other right toe(s): Secondary | ICD-10-CM | POA: Diagnosis not present

## 2018-12-16 DIAGNOSIS — Z89511 Acquired absence of right leg below knee: Secondary | ICD-10-CM | POA: Diagnosis not present

## 2018-12-16 DIAGNOSIS — Z89411 Acquired absence of right great toe: Secondary | ICD-10-CM | POA: Diagnosis not present

## 2018-12-16 DIAGNOSIS — F17210 Nicotine dependence, cigarettes, uncomplicated: Secondary | ICD-10-CM | POA: Diagnosis not present

## 2018-12-16 DIAGNOSIS — I2510 Atherosclerotic heart disease of native coronary artery without angina pectoris: Secondary | ICD-10-CM | POA: Diagnosis not present

## 2018-12-16 NOTE — Telephone Encounter (Signed)
Received a message from [facility]) stating pt just got out of rehab from toes being removed so she will not be needing the diabetic shoes.   I called pt and left a message for her to call me back to confirm that she is wanting to cancel the order for the shoes.

## 2018-12-17 DIAGNOSIS — E1151 Type 2 diabetes mellitus with diabetic peripheral angiopathy without gangrene: Secondary | ICD-10-CM | POA: Diagnosis not present

## 2018-12-17 DIAGNOSIS — I70209 Unspecified atherosclerosis of native arteries of extremities, unspecified extremity: Secondary | ICD-10-CM | POA: Diagnosis not present

## 2018-12-17 DIAGNOSIS — Z89411 Acquired absence of right great toe: Secondary | ICD-10-CM | POA: Diagnosis not present

## 2018-12-17 DIAGNOSIS — Z89612 Acquired absence of left leg above knee: Secondary | ICD-10-CM | POA: Diagnosis not present

## 2018-12-17 DIAGNOSIS — Z7984 Long term (current) use of oral hypoglycemic drugs: Secondary | ICD-10-CM | POA: Diagnosis not present

## 2018-12-17 DIAGNOSIS — Z89421 Acquired absence of other right toe(s): Secondary | ICD-10-CM | POA: Diagnosis not present

## 2018-12-17 DIAGNOSIS — Z4800 Encounter for change or removal of nonsurgical wound dressing: Secondary | ICD-10-CM | POA: Diagnosis not present

## 2018-12-17 DIAGNOSIS — Z4781 Encounter for orthopedic aftercare following surgical amputation: Secondary | ICD-10-CM | POA: Diagnosis not present

## 2018-12-17 DIAGNOSIS — I10 Essential (primary) hypertension: Secondary | ICD-10-CM | POA: Diagnosis not present

## 2018-12-18 DIAGNOSIS — I10 Essential (primary) hypertension: Secondary | ICD-10-CM | POA: Diagnosis not present

## 2018-12-18 DIAGNOSIS — Z89411 Acquired absence of right great toe: Secondary | ICD-10-CM | POA: Diagnosis not present

## 2018-12-18 DIAGNOSIS — Z89612 Acquired absence of left leg above knee: Secondary | ICD-10-CM | POA: Diagnosis not present

## 2018-12-18 DIAGNOSIS — Z4800 Encounter for change or removal of nonsurgical wound dressing: Secondary | ICD-10-CM | POA: Diagnosis not present

## 2018-12-18 DIAGNOSIS — Z7984 Long term (current) use of oral hypoglycemic drugs: Secondary | ICD-10-CM | POA: Diagnosis not present

## 2018-12-18 DIAGNOSIS — Z89421 Acquired absence of other right toe(s): Secondary | ICD-10-CM | POA: Diagnosis not present

## 2018-12-18 DIAGNOSIS — I70209 Unspecified atherosclerosis of native arteries of extremities, unspecified extremity: Secondary | ICD-10-CM | POA: Diagnosis not present

## 2018-12-18 DIAGNOSIS — Z4781 Encounter for orthopedic aftercare following surgical amputation: Secondary | ICD-10-CM | POA: Diagnosis not present

## 2018-12-18 DIAGNOSIS — E1151 Type 2 diabetes mellitus with diabetic peripheral angiopathy without gangrene: Secondary | ICD-10-CM | POA: Diagnosis not present

## 2018-12-20 DIAGNOSIS — Z7984 Long term (current) use of oral hypoglycemic drugs: Secondary | ICD-10-CM | POA: Diagnosis not present

## 2018-12-20 DIAGNOSIS — I10 Essential (primary) hypertension: Secondary | ICD-10-CM | POA: Diagnosis not present

## 2018-12-20 DIAGNOSIS — Z89421 Acquired absence of other right toe(s): Secondary | ICD-10-CM | POA: Diagnosis not present

## 2018-12-20 DIAGNOSIS — Z4800 Encounter for change or removal of nonsurgical wound dressing: Secondary | ICD-10-CM | POA: Diagnosis not present

## 2018-12-20 DIAGNOSIS — E1151 Type 2 diabetes mellitus with diabetic peripheral angiopathy without gangrene: Secondary | ICD-10-CM | POA: Diagnosis not present

## 2018-12-20 DIAGNOSIS — Z89612 Acquired absence of left leg above knee: Secondary | ICD-10-CM | POA: Diagnosis not present

## 2018-12-20 DIAGNOSIS — Z89411 Acquired absence of right great toe: Secondary | ICD-10-CM | POA: Diagnosis not present

## 2018-12-20 DIAGNOSIS — Z4781 Encounter for orthopedic aftercare following surgical amputation: Secondary | ICD-10-CM | POA: Diagnosis not present

## 2018-12-20 DIAGNOSIS — I70209 Unspecified atherosclerosis of native arteries of extremities, unspecified extremity: Secondary | ICD-10-CM | POA: Diagnosis not present

## 2018-12-22 ENCOUNTER — Encounter: Payer: Self-pay | Admitting: Family

## 2018-12-22 ENCOUNTER — Ambulatory Visit (INDEPENDENT_AMBULATORY_CARE_PROVIDER_SITE_OTHER): Payer: Medicare HMO | Admitting: Family

## 2018-12-22 ENCOUNTER — Other Ambulatory Visit: Payer: Self-pay

## 2018-12-22 VITALS — BP 131/75 | HR 81 | Temp 97.1°F | Resp 14

## 2018-12-22 DIAGNOSIS — E1151 Type 2 diabetes mellitus with diabetic peripheral angiopathy without gangrene: Secondary | ICD-10-CM

## 2018-12-22 DIAGNOSIS — IMO0002 Reserved for concepts with insufficient information to code with codable children: Secondary | ICD-10-CM

## 2018-12-22 DIAGNOSIS — G5621 Lesion of ulnar nerve, right upper limb: Secondary | ICD-10-CM | POA: Diagnosis not present

## 2018-12-22 DIAGNOSIS — G5620 Lesion of ulnar nerve, unspecified upper limb: Secondary | ICD-10-CM | POA: Diagnosis not present

## 2018-12-22 DIAGNOSIS — R202 Paresthesia of skin: Secondary | ICD-10-CM | POA: Diagnosis not present

## 2018-12-22 DIAGNOSIS — R002 Palpitations: Secondary | ICD-10-CM | POA: Diagnosis not present

## 2018-12-22 DIAGNOSIS — Z89612 Acquired absence of left leg above knee: Secondary | ICD-10-CM

## 2018-12-22 DIAGNOSIS — I779 Disorder of arteries and arterioles, unspecified: Secondary | ICD-10-CM

## 2018-12-22 DIAGNOSIS — Z89431 Acquired absence of right foot: Secondary | ICD-10-CM

## 2018-12-22 DIAGNOSIS — G5622 Lesion of ulnar nerve, left upper limb: Secondary | ICD-10-CM | POA: Diagnosis not present

## 2018-12-22 DIAGNOSIS — E1165 Type 2 diabetes mellitus with hyperglycemia: Secondary | ICD-10-CM

## 2018-12-22 MED ORDER — COLLAGENASE 250 UNIT/GM EX OINT: 1.0000 "application " | TOPICAL_OINTMENT | Freq: Every day | CUTANEOUS | 1 refills | Status: DC

## 2018-12-22 NOTE — Patient Instructions (Signed)
Peripheral Vascular Disease  Peripheral vascular disease (PVD) is a disease of the blood vessels that are not part of your heart and brain. A simple term for PVD is poor circulation. In most cases, PVD narrows the blood vessels that carry blood from your heart to the rest of your body. This can reduce the supply of blood to your arms, legs, and internal organs, like your stomach or kidneys. However, PVD most often affects a person's lower legs and feet. Without treatment, PVD tends to get worse. PVD can also lead to acute ischemic limb. This is when an arm or leg suddenly cannot get enough blood. This is a medical emergency. Follow these instructions at home: Lifestyle  Do not use any products that contain nicotine or tobacco, such as cigarettes and e-cigarettes. If you need help quitting, ask your doctor.  Lose weight if you are overweight. Or, stay at a healthy weight as told by your doctor.  Eat a diet that is low in fat and cholesterol. If you need help, ask your doctor.  Exercise regularly. Ask your doctor for activities that are right for you. General instructions  Take over-the-counter and prescription medicines only as told by your doctor.  Take good care of your feet: ? Wear comfortable shoes that fit well. ? Check your feet often for any cuts or sores.  Keep all follow-up visits as told by your doctor This is important. Contact a doctor if:  You have cramps in your legs when you walk.  You have leg pain when you are at rest.  You have coldness in a leg or foot.  Your skin changes.  You are unable to get or have an erection (erectile dysfunction).  You have cuts or sores on your feet that do not heal. Get help right away if:  Your arm or leg turns cold, numb, and blue.  Your arms or legs become red, warm, swollen, painful, or numb.  You have chest pain.  You have trouble breathing.  You suddenly have weakness in your face, arm, or leg.  You become very  confused or you cannot speak.  You suddenly have a very bad headache.  You suddenly cannot see. Summary  Peripheral vascular disease (PVD) is a disease of the blood vessels.  A simple term for PVD is poor circulation. Without treatment, PVD tends to get worse.  Treatment may include exercise, low fat and low cholesterol diet, and quitting smoking. This information is not intended to replace advice given to you by your health care provider. Make sure you discuss any questions you have with your health care provider. Document Released: 12/19/2009 Document Revised: 11/01/2016 Document Reviewed: 11/01/2016 Elsevier Interactive Patient Education  2019 [employer]  

## 2018-12-22 NOTE — Progress Notes (Signed)
[facility] [place]   CC: Follow up peripheral artery occlusive disease  History of Present Illness Danielle Buchanan is a 63 y.o. female who is s/p right TMA on 11-19-18 by Dr. Myra Gianotti for ischemic toes.  She is also s/p right 4th toe amputationon 11-04-17 by [name] fornecrotic right fourth toe. Shehas undergone balloon angioplasty of her right lower extremity on 10-09-17  as well as a left-sided above-knee amputation in November 2018 that required a revision a few days later.   She walks well with her walker.   She was receiving physical therapy with her prosthesis; she received new liners, was working with [employer].   Her endocrinologist was Dr. Everardo All, will be seeing Dr. Sharl Ma starting in July 2020.Metformin causes diarrhea, is not taking.    Diabetic: Yes, 10.9 A1C on 11-12-18, uncontrolled Tobacco use: former smoker, quit February 2020  (started in her teens)  Pt meds include: Statin :Yes Betablocker: No ASA: Yes Other anticoagulants/antiplatelets: no     Past Medical History:  Diagnosis Date  . Allergic rhinitis   . Depression   . Diabetes (HCC)   . Dyslipidemia   . HTN (hypertension)   . Insomnia   . OSA (obstructive sleep apnea)    no cpap  . Urinary incontinence     Social History Social History   Tobacco Use  . Smoking status: Former Smoker    Packs/day: 1.00    Years: 51.00    Pack years: 51.00    Types: Cigarettes    Last attempt to quit: 08/08/2018    Years since quitting: 0.3  . Smokeless tobacco: Never Used  Substance Use Topics  . Alcohol use: No    Alcohol/week: 0.0 standard drinks  . Drug use: No    Family History Family History  Problem Relation Age of Onset  . Heart attack Mother   . Alcohol abuse Mother   . Heart attack Father   . Alcohol abuse Father   . Heart attack Sister   . Heart attack Brother   . Hypercholesterolemia Sister   . Diabetes Neg Hx     Past Surgical  History:  Procedure Laterality Date  . ABDOMINAL AORTOGRAM W/LOWER EXTREMITY N/A 06/24/2017   Procedure: ABDOMINAL AORTOGRAM W/LOWER EXTREMITY;  Surgeon: Maeola Harman, MD;  Location: [facility] INVASIVE CV LAB;  Service: Cardiovascular;  Laterality: N/A;  . ABDOMINAL AORTOGRAM W/LOWER EXTREMITY Right 10/09/2017   Procedure: ABDOMINAL AORTOGRAM W/LOWER EXTREMITY;  Surgeon: Maeola Harman, MD;  Location: [facility] INVASIVE CV LAB;  Service: Cardiovascular;  Laterality: Right;  . AMPUTATION Left 07/23/2017   Procedure: AMPUTATION  BELOW KNEE;  Surgeon: Maeola Harman, MD;  Location: [facility] OR;  Service: Vascular;  Laterality: Left;  . AMPUTATION Left 08/12/2017   Procedure: REVISION BELOW KNEE;  Surgeon: Maeola Harman, MD;  Location: [facility] OR;  Service: Vascular;  Laterality: Left;  . AMPUTATION Left 08/27/2017   Procedure: left ABOVE KNEE AMPUTATION;  Surgeon: Maeola Harman, MD;  Location: [facility] OR;  Service: Vascular;  Laterality: Left;  . AMPUTATION Left 08/30/2017   Procedure: REVISION AMPUTATION ABOVE KNEE;  Surgeon: Nada Libman, MD;  Location: [facility] OR;  Service: Vascular;  Laterality: Left;  . AMPUTATION Right 11/04/2017   Procedure: AMPUTATION RIGHT FOURTH TOE;  Surgeon: Maeola Harman, MD;  Location: [facility] OR;  Service: Vascular;  Laterality: Right;  . AMPUTATION Right 11/12/2018   Procedure: AMPUTATION OF second, third and fifth toes;  Surgeon:  Nada Libman, MD;  Location: [facility] OR;  Service: Vascular;  Laterality: Right;  . APPLICATION OF WOUND VAC Left 08/27/2017   Procedure: APPLICATION OF WOUND VAC;  Surgeon: Maeola Harman, MD;  Location: [facility] OR;  Service: Vascular;  Laterality: Left;  . CARPAL TUNNEL RELEASE Right   . COLONOSCOPY    . LOWER EXTREMITY ANGIOGRAM Right 11/12/2018   Procedure: ANGIOGRAM OF Right LEG;  Surgeon: Nada Libman, MD;  Location: [facility] OR;  Service: Vascular;  Laterality: Right;  . LOWER EXTREMITY INTERVENTION  Right 10/09/2017   Procedure: LOWER EXTREMITY INTERVENTION;  Surgeon: Maeola Harman, MD;  Location: [facility] INVASIVE CV LAB;  Service: Cardiovascular;  Laterality: Right;  . TOE AMPUTATION Right 11/12/2018   2ND 3RD & 5TH TOE   . TRANSMETATARSAL AMPUTATION Right 11/19/2018   Procedure: TRANSMETATARSAL AMPUTATION REVISION;  Surgeon: Nada Libman, MD;  Location: [facility] OR;  Service: Vascular;  Laterality: Right;    Allergies  Allergen Reactions  . Milk-Related Compounds Other (See Comments)    incontinence  . Doxycycline Rash    Current Outpatient Medications  Medication Sig Dispense Refill  . amitriptyline (ELAVIL) 25 MG tablet Take 1 tablet (25 mg total) by mouth at bedtime. 30 tablet 0  . aspirin 81 MG EC tablet Take 1 tablet (81 mg total) by mouth daily. 30 tablet 0  . clopidogrel (PLAVIX) 75 MG tablet Take 1 tablet (75 mg total) by mouth daily with breakfast. 30 tablet 0  . gabapentin (NEURONTIN) 100 MG capsule Take 2 capsules (200 mg total) by mouth at bedtime. 60 capsule 0  . Insulin Isophane & Regular Human (HUMULIN 70/30 KWIKPEN) (70-30) 100 UNIT/ML PEN Inject 70 Units into the skin 2 (two) times daily. Inject 70 units with breakfast and 60 units with dinner. Hold for CBG less than 60. Notify MD if CBG greater than 300 (DM) 15 mL 0  . LORazepam (ATIVAN) 0.5 MG tablet Take 1 tablet (0.5 mg total) by mouth 2 (two) times daily as needed for anxiety. 15 tablet 0  . losartan (COZAAR) 100 MG tablet Take 1 tablet (100 mg total) by mouth daily. 30 tablet 0  . Multiple Vitamin (MULTIVITAMIN WITH MINERALS) TABS tablet Place 1 tablet into feeding tube daily. 30 tablet 0  . nicotine polacrilex (NICORELIEF) 2 MG gum Take 1 each (2 mg total) by mouth daily as needed for smoking cessation. 30 tablet 0  . oxyCODONE-acetaminophen (PERCOCET/ROXICET) 5-325 MG tablet Take 1 tablet by mouth every 6 (six) hours as needed for severe pain. 30 tablet 0  . polyethylene glycol (MIRALAX / GLYCOLAX) packet  Take 17 g by mouth daily as needed for moderate constipation. 14 each 0  . pravastatin (PRAVACHOL) 20 MG tablet Take 2 tablets (40 mg total) by mouth daily. 30 tablet 0  . senna-docusate (SENOKOT-S) 8.6-50 MG tablet Take 1 tablet by mouth 2 (two) times daily. 60 tablet 0  . sertraline (ZOLOFT) 100 MG tablet Take 1 tablet (100 mg total) by mouth daily. 30 tablet 0  . traZODone (DESYREL) 50 MG tablet Take 1 tablet (50 mg total) by mouth at bedtime. 30 tablet 0  . Vitamin E 180 MG CAPS Take 180 mg by mouth daily. 30 capsule 0  . Zinc 50 MG TABS Take 1 tablet (50 mg total) by mouth daily. 30 tablet 0   No current facility-administered medications for this visit.     ROS: See HPI for pertinent positives and negatives.   Physical Examination  Vitals:  12/22/18 1126  BP: 131/75  Pulse: 81  Resp: 14  Temp: (!) 97.1 F (36.2 C)  TempSrc: Oral  SpO2: 96%   There is no height or weight on file to calculate BMI.  General: A&O x 3, WDWN, obese female. Gait: seated in w/c HENT: No gross abnormalities.  Eyes: PERRLA. Pulmonary: Respirations are non labored, fair air movement in all fields Cardiac: regular rhythm, no detected murmur.         Carotid Bruits Right Left   Negative Negative   Radial pulses are 2+ palpable bilaterally   Adominal aortic pulse is not palpable                         VASCULAR EXAM: Extremities  Well healed left AKA stump. Right TMA site: eschar at the medial and lateral edges, separation of the incision toward the lateral aspect, large amount of serosanguinous drainage.                                                                                                           LE Pulses Right Left       FEMORAL  not palpable seated in w/c  not palpable        POPLITEAL  not palpable   AKA       POSTERIOR TIBIAL  not palpable, monophasic Doppler signals   AKA       DORSALIS PEDIS      ANTERIOR TIBIAL not palpable, monophasic Doppler signals  AKA     Abdomen: soft, NT, no palpable masses. Large panus Skin: no rashes, no cellulitis, no ulcers noted. Musculoskeletal: no muscle wasting or atrophy. See Extremities.  Neurologic: A&O X 3; appropriate affect, Sensation is normal; MOTOR FUNCTION:  moving all extremities equally, motor strength 5/5 throughout. Speech is fluent/normal. CN 2-12 intact. Psychiatric: Thought content is normal, mood appropriate for clinical situation.     ASSESSMENT: SHADDAI SHAPLEY is a 63 y.o. female who is s/p right TMA on 11-19-18 by Dr. Myra Gianotti for ischemic toes.  Shehas undergone balloon angioplasty of her right lower extremity on 10-09-17  as well as a left-sided above-knee amputation.   Her atherosclerotic risk factors include uncontrolled DM,51+ years of smoking (quit in February 2020), obesity, and OSA.  I advised her to work closely with her provider that helps her manage her DM, in order to decrease her risk of loss of her right leg, MI, stoke, diabetic retinopathy, and other complications from uncontrolled DM.   Dr. Myra Gianotti spoke with pt and son and examined pt. All sutures removed from right TMA site, hydrogel dressing applied.  HH to start daily Santyl dressing changes to right TMA incision that has some dehiscence laterally, and pale non viable tissue.  Keep pressure off right heel, prevent pressure ulcer.    PLAN:  Based on the patient's vascular studies and examination, pt will return to clinic in 2-3 weeks for right TMA wound evaluation, see me or PA or Dr. Myra Gianotti.  I advised pt and her  relative with her to notify us if she develops worsening concerns re the circulation in her feet or legs.    I discussed in depth with the patient the nature of atherosclerosis, and emphasized the importance of maximal medical management including strict control of blood pressure, blood glucose, and lipid levels, obtaining regular exercise, and continued cessation of smoking.  The patient is aware that  without maximal medical management the underlying atherosclerotic disease process will progress, limiting the benefit of any interventions.  The patient was given information about PAD including signs, symptoms, treatment, what symptoms should prompt the patient to seek immediate medical care, and risk reduction measures to take.  Charisse March, RN, MSN, FNP-C [facility] [place] Phone: [phone]  Clinic MD: Myra Gianotti  12/22/18 11:58 AM

## 2018-12-23 ENCOUNTER — Telehealth: Payer: Self-pay | Admitting: *Deleted

## 2018-12-23 ENCOUNTER — Telehealth: Payer: Self-pay | Admitting: Surgery

## 2018-12-23 ENCOUNTER — Ambulatory Visit (INDEPENDENT_AMBULATORY_CARE_PROVIDER_SITE_OTHER): Payer: Medicare HMO | Admitting: Family

## 2018-12-23 ENCOUNTER — Other Ambulatory Visit: Payer: Self-pay

## 2018-12-23 ENCOUNTER — Encounter: Payer: Self-pay | Admitting: Family

## 2018-12-23 VITALS — BP 134/77 | HR 81 | Temp 98.4°F | Resp 16 | Ht 62.0 in | Wt 192.0 lb

## 2018-12-23 DIAGNOSIS — Z87891 Personal history of nicotine dependence: Secondary | ICD-10-CM

## 2018-12-23 DIAGNOSIS — Z89411 Acquired absence of right great toe: Secondary | ICD-10-CM | POA: Diagnosis not present

## 2018-12-23 DIAGNOSIS — Z4800 Encounter for change or removal of nonsurgical wound dressing: Secondary | ICD-10-CM | POA: Diagnosis not present

## 2018-12-23 DIAGNOSIS — I779 Disorder of arteries and arterioles, unspecified: Secondary | ICD-10-CM

## 2018-12-23 DIAGNOSIS — E1151 Type 2 diabetes mellitus with diabetic peripheral angiopathy without gangrene: Secondary | ICD-10-CM | POA: Diagnosis not present

## 2018-12-23 DIAGNOSIS — Z89612 Acquired absence of left leg above knee: Secondary | ICD-10-CM

## 2018-12-23 DIAGNOSIS — Z4781 Encounter for orthopedic aftercare following surgical amputation: Secondary | ICD-10-CM | POA: Diagnosis not present

## 2018-12-23 DIAGNOSIS — IMO0002 Reserved for concepts with insufficient information to code with codable children: Secondary | ICD-10-CM

## 2018-12-23 DIAGNOSIS — I10 Essential (primary) hypertension: Secondary | ICD-10-CM | POA: Diagnosis not present

## 2018-12-23 DIAGNOSIS — E1165 Type 2 diabetes mellitus with hyperglycemia: Secondary | ICD-10-CM

## 2018-12-23 DIAGNOSIS — Z89421 Acquired absence of other right toe(s): Secondary | ICD-10-CM | POA: Diagnosis not present

## 2018-12-23 DIAGNOSIS — Z89431 Acquired absence of right foot: Secondary | ICD-10-CM

## 2018-12-23 DIAGNOSIS — Z7984 Long term (current) use of oral hypoglycemic drugs: Secondary | ICD-10-CM | POA: Diagnosis not present

## 2018-12-23 DIAGNOSIS — I70209 Unspecified atherosclerosis of native arteries of extremities, unspecified extremity: Secondary | ICD-10-CM | POA: Diagnosis not present

## 2018-12-23 MED ORDER — CEPHALEXIN 500 MG PO CAPS: 500.0000 mg | ORAL_CAPSULE | Freq: Three times a day (TID) | ORAL | 0 refills | Status: DC

## 2018-12-23 NOTE — Telephone Encounter (Signed)
I called and spoke to nurse at [facility] per Dr. Myra Gianotti.  Jovita Gamma orders for daily dressing changes with Santyl to the right Transmet Amp site.  Nurse verbalized understanding.  Ernst Spell., LPN

## 2018-12-23 NOTE — Patient Instructions (Signed)
Peripheral Vascular Disease  Peripheral vascular disease (PVD) is a disease of the blood vessels that are not part of your heart and brain. A simple term for PVD is poor circulation. In most cases, PVD narrows the blood vessels that carry blood from your heart to the rest of your body. This can reduce the supply of blood to your arms, legs, and internal organs, like your stomach or kidneys. However, PVD most often affects a person's lower legs and feet. Without treatment, PVD tends to get worse. PVD can also lead to acute ischemic limb. This is when an arm or leg suddenly cannot get enough blood. This is a medical emergency. Follow these instructions at home: Lifestyle  Do not use any products that contain nicotine or tobacco, such as cigarettes and e-cigarettes. If you need help quitting, ask your doctor.  Lose weight if you are overweight. Or, stay at a healthy weight as told by your doctor.  Eat a diet that is low in fat and cholesterol. If you need help, ask your doctor.  Exercise regularly. Ask your doctor for activities that are right for you. General instructions  Take over-the-counter and prescription medicines only as told by your doctor.  Take good care of your feet: ? Wear comfortable shoes that fit well. ? Check your feet often for any cuts or sores.  Keep all follow-up visits as told by your doctor This is important. Contact a doctor if:  You have cramps in your legs when you walk.  You have leg pain when you are at rest.  You have coldness in a leg or foot.  Your skin changes.  You are unable to get or have an erection (erectile dysfunction).  You have cuts or sores on your feet that do not heal. Get help right away if:  Your arm or leg turns cold, numb, and blue.  Your arms or legs become red, warm, swollen, painful, or numb.  You have chest pain.  You have trouble breathing.  You suddenly have weakness in your face, arm, or leg.  You become very  confused or you cannot speak.  You suddenly have a very bad headache.  You suddenly cannot see. Summary  Peripheral vascular disease (PVD) is a disease of the blood vessels.  A simple term for PVD is poor circulation. Without treatment, PVD tends to get worse.  Treatment may include exercise, low fat and low cholesterol diet, and quitting smoking. This information is not intended to replace advice given to you by your health care provider. Make sure you discuss any questions you have with your health care provider. Document Released: 12/19/2009 Document Revised: 11/01/2016 Document Reviewed: 11/01/2016 Elsevier Interactive Patient Education  2019 [employer]  

## 2018-12-23 NOTE — Telephone Encounter (Signed)
Received call from [facility] nurse that Danielle Buchanan has 3 retained sutures in her TMA wound and the nurse has stated that the wound has dehisced. Patient will come to the office this afternoon for a wound check with our NP.

## 2018-12-23 NOTE — Progress Notes (Signed)
[facility] [place]   CC: HH concern for retained suture and wound dehiscence of right TMA, peripheral artery occlusive disease  History of Present Illness VALEKA Buchanan is a 63 y.o. female who is s/p right TMA on 11-19-18 by Dr. Myra Gianotti for ischemic toes.  She is also s/p right 4th toe amputationon 11-04-17 by [name] fornecrotic right fourth toe. Shehas undergoneballoonangioplastyof her right lower extremityon 1-2-19as well as a left-sided above-knee amputation in November 2018 that required a revision a few days later.  She returns today at the request of [facility] re retained suture at her right TMA site and wound dehiscence.  Pt denies fever or chills, denies pain. Pt son has the Santyl that I prescribed yesterday, has not been used for dressing change yet.   She was receiving physical therapy with her prosthesis; she received new liners, was working with [employer].  Her endocrinologistwas Dr. Everardo All, will be seeing Dr. Sharl Ma starting in July 2020.Metformin causes diarrhea, is not taking.    Diabetic:Yes, 10.9 A1C on 11-12-18, uncontrolled Tobacco TXM:IWOEHO smoker, quit February 2020 (startedin her teens)  Pt meds include: Statin :Yes Betablocker:No ASA:Yes Other anticoagulants/antiplatelets:no     Past Medical History:  Diagnosis Date  . Allergic rhinitis   . Depression   . Diabetes (HCC)   . Dyslipidemia   . HTN (hypertension)   . Insomnia   . OSA (obstructive sleep apnea)    no cpap  . Urinary incontinence     Social History Social History   Tobacco Use  . Smoking status: Former Smoker    Packs/day: 1.00    Years: 51.00    Pack years: 51.00    Types: Cigarettes    Last attempt to quit: 08/08/2018    Years since quitting: 0.3  . Smokeless tobacco: Never Used  Substance Use Topics  . Alcohol use: No    Alcohol/week: 0.0 standard drinks  . Drug use: No    Family History Family History   Problem Relation Age of Onset  . Heart attack Mother   . Alcohol abuse Mother   . Heart attack Father   . Alcohol abuse Father   . Heart attack Sister   . Heart attack Brother   . Hypercholesterolemia Sister   . Diabetes Neg Hx     Past Surgical History:  Procedure Laterality Date  . ABDOMINAL AORTOGRAM W/LOWER EXTREMITY N/A 06/24/2017   Procedure: ABDOMINAL AORTOGRAM W/LOWER EXTREMITY;  Surgeon: Maeola Harman, MD;  Location: [facility] INVASIVE CV LAB;  Service: Cardiovascular;  Laterality: N/A;  . ABDOMINAL AORTOGRAM W/LOWER EXTREMITY Right 10/09/2017   Procedure: ABDOMINAL AORTOGRAM W/LOWER EXTREMITY;  Surgeon: Maeola Harman, MD;  Location: [facility] INVASIVE CV LAB;  Service: Cardiovascular;  Laterality: Right;  . AMPUTATION Left 07/23/2017   Procedure: AMPUTATION  BELOW KNEE;  Surgeon: Maeola Harman, MD;  Location: [facility] OR;  Service: Vascular;  Laterality: Left;  . AMPUTATION Left 08/12/2017   Procedure: REVISION BELOW KNEE;  Surgeon: Maeola Harman, MD;  Location: [facility] OR;  Service: Vascular;  Laterality: Left;  . AMPUTATION Left 08/27/2017   Procedure: left ABOVE KNEE AMPUTATION;  Surgeon: Maeola Harman, MD;  Location: [facility] OR;  Service: Vascular;  Laterality: Left;  . AMPUTATION Left 08/30/2017   Procedure: REVISION AMPUTATION ABOVE KNEE;  Surgeon: Nada Libman, MD;  Location: [facility] OR;  Service: Vascular;  Laterality: Left;  . AMPUTATION Right 11/04/2017   Procedure: AMPUTATION RIGHT FOURTH TOE;  Surgeon:  Maeola Harman, MD;  Location: [facility] OR;  Service: Vascular;  Laterality: Right;  . AMPUTATION Right 11/12/2018   Procedure: AMPUTATION OF second, third and fifth toes;  Surgeon: Nada Libman, MD;  Location: [facility] OR;  Service: Vascular;  Laterality: Right;  . APPLICATION OF WOUND VAC Left 08/27/2017   Procedure: APPLICATION OF WOUND VAC;  Surgeon: Maeola Harman, MD;  Location: [facility] OR;  Service: Vascular;  Laterality: Left;   . CARPAL TUNNEL RELEASE Right   . COLONOSCOPY    . LOWER EXTREMITY ANGIOGRAM Right 11/12/2018   Procedure: ANGIOGRAM OF Right LEG;  Surgeon: Nada Libman, MD;  Location: [facility] OR;  Service: Vascular;  Laterality: Right;  . LOWER EXTREMITY INTERVENTION Right 10/09/2017   Procedure: LOWER EXTREMITY INTERVENTION;  Surgeon: Maeola Harman, MD;  Location: [facility] INVASIVE CV LAB;  Service: Cardiovascular;  Laterality: Right;  . TOE AMPUTATION Right 11/12/2018   2ND 3RD & 5TH TOE   . TRANSMETATARSAL AMPUTATION Right 11/19/2018   Procedure: TRANSMETATARSAL AMPUTATION REVISION;  Surgeon: Nada Libman, MD;  Location: [facility] OR;  Service: Vascular;  Laterality: Right;    Allergies  Allergen Reactions  . Milk-Related Compounds Other (See Comments)    incontinence  . Doxycycline Rash    Current Outpatient Medications  Medication Sig Dispense Refill  . amitriptyline (ELAVIL) 25 MG tablet Take 1 tablet (25 mg total) by mouth at bedtime. 30 tablet 0  . aspirin 81 MG EC tablet Take 1 tablet (81 mg total) by mouth daily. 30 tablet 0  . collagenase (SANTYL) ointment Apply 1 application topically daily. 30 g 1  . gabapentin (NEURONTIN) 100 MG capsule Take 2 capsules (200 mg total) by mouth at bedtime. 60 capsule 0  . Insulin Isophane & Regular Human (HUMULIN 70/30 KWIKPEN) (70-30) 100 UNIT/ML PEN Inject 70 Units into the skin 2 (two) times daily. Inject 70 units with breakfast and 60 units with dinner. Hold for CBG less than 60. Notify MD if CBG greater than 300 (DM) 15 mL 0  . LORazepam (ATIVAN) 0.5 MG tablet Take 1 tablet (0.5 mg total) by mouth 2 (two) times daily as needed for anxiety. 15 tablet 0  . losartan (COZAAR) 100 MG tablet Take 1 tablet (100 mg total) by mouth daily. 30 tablet 0  . Multiple Vitamin (MULTIVITAMIN WITH MINERALS) TABS tablet Place 1 tablet into feeding tube daily. 30 tablet 0  . nicotine polacrilex (NICORELIEF) 2 MG gum Take 1 each (2 mg total) by mouth daily as needed for  smoking cessation. 30 tablet 0  . oxyCODONE-acetaminophen (PERCOCET/ROXICET) 5-325 MG tablet Take 1 tablet by mouth every 6 (six) hours as needed for severe pain. 30 tablet 0  . polyethylene glycol (MIRALAX / GLYCOLAX) packet Take 17 g by mouth daily as needed for moderate constipation. 14 each 0  . pravastatin (PRAVACHOL) 20 MG tablet Take 2 tablets (40 mg total) by mouth daily. 30 tablet 0  . senna-docusate (SENOKOT-S) 8.6-50 MG tablet Take 1 tablet by mouth 2 (two) times daily. 60 tablet 0  . sertraline (ZOLOFT) 100 MG tablet Take 1 tablet (100 mg total) by mouth daily. 30 tablet 0  . traZODone (DESYREL) 50 MG tablet Take 1 tablet (50 mg total) by mouth at bedtime. 30 tablet 0  . TRUE METRIX BLOOD GLUCOSE TEST test strip USE TO CHECK YOUR BLOOD SUGAR TWICE A DAY (DX  E11.21) IN VITRO 90 DAYS    . Vitamin E 180 MG CAPS Take 180 mg by mouth  daily. 30 capsule 0  . Zinc 50 MG TABS Take 1 tablet (50 mg total) by mouth daily. 30 tablet 0  . clopidogrel (PLAVIX) 75 MG tablet Take 1 tablet (75 mg total) by mouth daily with breakfast. (Patient not taking: Reported on 12/23/2018) 30 tablet 0   No current facility-administered medications for this visit.     ROS: See HPI for pertinent positives and negatives.   Physical Examination  Vitals:   12/23/18 1405  BP: 134/77  Pulse: 81  Resp: 16  Temp: 98.4 F (36.9 C)  TempSrc: Oral  SpO2: 95%  Weight: 192 lb (87.1 kg)  Height:  (1.575 m)   Body mass index is 35.12 kg/m.  General: A&O x 3, WDWN, obese female. Gait: seated in w/c HENT: No gross abnormalities.  Eyes: PERRLA. Pulmonary: Respirations are non labored, fair air movement in all fields Cardiac: regular rhythm, no detected murmur.         Carotid Bruits Right Left   Negative Negative   Radial pulses are 2+ palpable bilaterally   Adominal aortic pulse is not palpable                         VASCULAR EXAM: Extremities  Well healed left AKA stump. Right TMA site: no  maceration today as was present yesterday, wound looks improved. One stub of a suture noted, is not connected to the other side of the incision, no other sutures noted.  See photos below.   Right TMA  Antero-lateral view, right TMA  Right TMA, medial view                                                                                                                                                        LE Pulses Right Left       FEMORAL  not palpable seated in w/c  not palpable        POPLITEAL  not palpable   AKA       POSTERIOR TIBIAL  not palpable, monophasic Doppler signals   AKA       DORSALIS PEDIS      ANTERIOR TIBIAL not palpable, monophasic Doppler signals  AKA    Abdomen: soft, NT, no palpable masses.Large panus Skin: no rashes, no cellulitis, no ulcers noted. Musculoskeletal: no muscle wasting or atrophy. See Extremities. Neurologic: A&O X 3; appropriate affect, Sensation is normal; MOTOR FUNCTION: moving all extremities equally, motor strength 5/5 throughout. Speech is fluent/normal. CN 2-12 intact. Psychiatric: Thought content is normal, mood appropriate for clinical situation     ASSESSMENT: Danielle Buchanan is a 63 y.o. female who is s/p right TMA on 11-19-18 by Dr. Myra Gianotti for ischemic toes.  Shehas undergoneballoonangioplastyof her right lower extremityon 1-2-19as well as a left-sided above-knee amputation.  Her atherosclerotic risk factors include uncontrolled DM,51+ years of smoking (quit in February 2020), obesity, and OSA.  I advised her to work closely with her provider that helps her manage her DM, in order to decrease her risk of loss of her right leg, MI, stoke, diabetic retinopathy, and other complications from uncontrolled DM.   All sutures removed from right TMA site yesterday, hydrogel dressing applied. Depth of TMA open areas probes (sterile cotton swab) no deeper than 1 cm Today I spoke with Dr. Arbie Cookey, we viewed the photos  of her left TMA site which looks improved from yesterday, no maceration as was present yesterday.  Santyl brought by pt son, Santyl dressing applied to right TMA site.  Will start Keflex prophylacticly: 500 mg po tid x 10 days, 0 refills.   HH to start daily Santyl dressing changes to dehisced TMA incision and pale non viable tissue.  Keep pressure off right heel, prevent pressure ulcer.    PLAN:  Based on the patient's HPI and examination, pt will return to clinic as already scheduled on 01-05-19, see Dr. Myra Gianotti to evaluate left TMA status. I advised pt and her relative with her to notify us if she develops worsening concerns re the circulation in her feet or legs.   I discussed in depth with the patient the nature of atherosclerosis, and emphasized the importance of maximal medical management including strict control of blood pressure, blood glucose, and lipid levels, obtaining regular exercise, and continued cessation of smoking.  The patient is aware that without maximal medical management the underlying atherosclerotic disease process will progress, limiting the benefit of any interventions.  The patient was given information about PAD including signs, symptoms, treatment, what symptoms should prompt the patient to seek immediate medical care, and risk reduction measures to take.  Charisse March, RN, MSN, FNP-C [facility] Phone: [phone]  Clinic MD: Early  12/23/18 2:13 PM

## 2018-12-24 DIAGNOSIS — E1151 Type 2 diabetes mellitus with diabetic peripheral angiopathy without gangrene: Secondary | ICD-10-CM | POA: Diagnosis not present

## 2018-12-24 DIAGNOSIS — Z89421 Acquired absence of other right toe(s): Secondary | ICD-10-CM | POA: Diagnosis not present

## 2018-12-24 DIAGNOSIS — Z7984 Long term (current) use of oral hypoglycemic drugs: Secondary | ICD-10-CM | POA: Diagnosis not present

## 2018-12-24 DIAGNOSIS — Z4800 Encounter for change or removal of nonsurgical wound dressing: Secondary | ICD-10-CM | POA: Diagnosis not present

## 2018-12-24 DIAGNOSIS — Z4781 Encounter for orthopedic aftercare following surgical amputation: Secondary | ICD-10-CM | POA: Diagnosis not present

## 2018-12-24 DIAGNOSIS — Z89411 Acquired absence of right great toe: Secondary | ICD-10-CM | POA: Diagnosis not present

## 2018-12-24 DIAGNOSIS — I10 Essential (primary) hypertension: Secondary | ICD-10-CM | POA: Diagnosis not present

## 2018-12-24 DIAGNOSIS — I70209 Unspecified atherosclerosis of native arteries of extremities, unspecified extremity: Secondary | ICD-10-CM | POA: Diagnosis not present

## 2018-12-24 DIAGNOSIS — Z89612 Acquired absence of left leg above knee: Secondary | ICD-10-CM | POA: Diagnosis not present

## 2018-12-24 NOTE — Telephone Encounter (Signed)
Pt and Mr Danielle Buchanan returned my call and they are not needing the diabetic shoes any longer.

## 2018-12-25 DIAGNOSIS — Z7984 Long term (current) use of oral hypoglycemic drugs: Secondary | ICD-10-CM | POA: Diagnosis not present

## 2018-12-25 DIAGNOSIS — I70209 Unspecified atherosclerosis of native arteries of extremities, unspecified extremity: Secondary | ICD-10-CM | POA: Diagnosis not present

## 2018-12-25 DIAGNOSIS — Z4781 Encounter for orthopedic aftercare following surgical amputation: Secondary | ICD-10-CM | POA: Diagnosis not present

## 2018-12-25 DIAGNOSIS — E1151 Type 2 diabetes mellitus with diabetic peripheral angiopathy without gangrene: Secondary | ICD-10-CM | POA: Diagnosis not present

## 2018-12-25 DIAGNOSIS — Z89421 Acquired absence of other right toe(s): Secondary | ICD-10-CM | POA: Diagnosis not present

## 2018-12-25 DIAGNOSIS — Z4800 Encounter for change or removal of nonsurgical wound dressing: Secondary | ICD-10-CM | POA: Diagnosis not present

## 2018-12-25 DIAGNOSIS — Z89612 Acquired absence of left leg above knee: Secondary | ICD-10-CM | POA: Diagnosis not present

## 2018-12-25 DIAGNOSIS — Z89411 Acquired absence of right great toe: Secondary | ICD-10-CM | POA: Diagnosis not present

## 2018-12-25 DIAGNOSIS — I10 Essential (primary) hypertension: Secondary | ICD-10-CM | POA: Diagnosis not present

## 2018-12-26 ENCOUNTER — Telehealth: Payer: Self-pay

## 2018-12-26 DIAGNOSIS — I70209 Unspecified atherosclerosis of native arteries of extremities, unspecified extremity: Secondary | ICD-10-CM | POA: Diagnosis not present

## 2018-12-26 DIAGNOSIS — E1151 Type 2 diabetes mellitus with diabetic peripheral angiopathy without gangrene: Secondary | ICD-10-CM | POA: Diagnosis not present

## 2018-12-26 DIAGNOSIS — Z4800 Encounter for change or removal of nonsurgical wound dressing: Secondary | ICD-10-CM | POA: Diagnosis not present

## 2018-12-26 DIAGNOSIS — Z89421 Acquired absence of other right toe(s): Secondary | ICD-10-CM | POA: Diagnosis not present

## 2018-12-26 DIAGNOSIS — Z89411 Acquired absence of right great toe: Secondary | ICD-10-CM | POA: Diagnosis not present

## 2018-12-26 DIAGNOSIS — Z7984 Long term (current) use of oral hypoglycemic drugs: Secondary | ICD-10-CM | POA: Diagnosis not present

## 2018-12-26 DIAGNOSIS — I10 Essential (primary) hypertension: Secondary | ICD-10-CM | POA: Diagnosis not present

## 2018-12-26 DIAGNOSIS — Z89612 Acquired absence of left leg above knee: Secondary | ICD-10-CM | POA: Diagnosis not present

## 2018-12-26 DIAGNOSIS — Z4781 Encounter for orthopedic aftercare following surgical amputation: Secondary | ICD-10-CM | POA: Diagnosis not present

## 2018-12-26 NOTE — Telephone Encounter (Signed)
Pt called with c/o 8/10 pain that is across the top of her foot to her knee. She denies injury to surgical site. She states the operative area does look better than it has been. She denies fever, warmth or redness around area. She has not tried any pain relievers yet. Encouraged her to elevate the area and take OTC pain relief. If this does not help she will call us back. No further concerns at this time.

## 2018-12-29 ENCOUNTER — Encounter: Payer: Self-pay | Admitting: Physical Therapy

## 2018-12-29 DIAGNOSIS — Z89619 Acquired absence of unspecified leg above knee: Secondary | ICD-10-CM | POA: Diagnosis not present

## 2018-12-29 DIAGNOSIS — R32 Unspecified urinary incontinence: Secondary | ICD-10-CM | POA: Diagnosis not present

## 2018-12-29 DIAGNOSIS — Z09 Encounter for follow-up examination after completed treatment for conditions other than malignant neoplasm: Secondary | ICD-10-CM | POA: Diagnosis not present

## 2018-12-29 NOTE — Therapy (Signed)
[facility] [address] [place], [place], [address] Phone: [phone]   Fax:  [phone]  Patient Details  Name: Danielle Buchanan MRN: [id number] Date of Birth: 06/06/1956 Referring Provider:  Maurice Small, MD  Encounter Date: 12/29/2018  PHYSICAL THERAPY DISCHARGE SUMMARY  Visits from Start of Care: 39  Current functional level related to goals / functional outcomes: See PT note on 10/28/2018   Remaining deficits: See PT note on 10/28/2018. Patient was hospitalized & underwent a partial foot amputation.    Education / Equipment: HEP & Prosthetic care/use.   Plan: Patient agrees to discharge.  Patient goals were not met. Patient is being discharged due to a change in medical status.  ?????          Murline Weigel PT, DPT 12/29/2018, 2:10 PM  [facility] [address] [place], [place], [address] Phone: [phone]   Fax:  [phone]

## 2018-12-30 DIAGNOSIS — Z89421 Acquired absence of other right toe(s): Secondary | ICD-10-CM | POA: Diagnosis not present

## 2018-12-30 DIAGNOSIS — Z4800 Encounter for change or removal of nonsurgical wound dressing: Secondary | ICD-10-CM | POA: Diagnosis not present

## 2018-12-30 DIAGNOSIS — Z89411 Acquired absence of right great toe: Secondary | ICD-10-CM | POA: Diagnosis not present

## 2018-12-30 DIAGNOSIS — Z4781 Encounter for orthopedic aftercare following surgical amputation: Secondary | ICD-10-CM | POA: Diagnosis not present

## 2018-12-30 DIAGNOSIS — Z89612 Acquired absence of left leg above knee: Secondary | ICD-10-CM | POA: Diagnosis not present

## 2018-12-30 DIAGNOSIS — E1151 Type 2 diabetes mellitus with diabetic peripheral angiopathy without gangrene: Secondary | ICD-10-CM | POA: Diagnosis not present

## 2018-12-30 DIAGNOSIS — Z7984 Long term (current) use of oral hypoglycemic drugs: Secondary | ICD-10-CM | POA: Diagnosis not present

## 2018-12-30 DIAGNOSIS — I70209 Unspecified atherosclerosis of native arteries of extremities, unspecified extremity: Secondary | ICD-10-CM | POA: Diagnosis not present

## 2018-12-30 DIAGNOSIS — I10 Essential (primary) hypertension: Secondary | ICD-10-CM | POA: Diagnosis not present

## 2018-12-31 DIAGNOSIS — Z4800 Encounter for change or removal of nonsurgical wound dressing: Secondary | ICD-10-CM | POA: Diagnosis not present

## 2018-12-31 DIAGNOSIS — Z4781 Encounter for orthopedic aftercare following surgical amputation: Secondary | ICD-10-CM | POA: Diagnosis not present

## 2018-12-31 DIAGNOSIS — Z7984 Long term (current) use of oral hypoglycemic drugs: Secondary | ICD-10-CM | POA: Diagnosis not present

## 2018-12-31 DIAGNOSIS — I70209 Unspecified atherosclerosis of native arteries of extremities, unspecified extremity: Secondary | ICD-10-CM | POA: Diagnosis not present

## 2018-12-31 DIAGNOSIS — I10 Essential (primary) hypertension: Secondary | ICD-10-CM | POA: Diagnosis not present

## 2018-12-31 DIAGNOSIS — Z89411 Acquired absence of right great toe: Secondary | ICD-10-CM | POA: Diagnosis not present

## 2018-12-31 DIAGNOSIS — E1151 Type 2 diabetes mellitus with diabetic peripheral angiopathy without gangrene: Secondary | ICD-10-CM | POA: Diagnosis not present

## 2018-12-31 DIAGNOSIS — Z89612 Acquired absence of left leg above knee: Secondary | ICD-10-CM | POA: Diagnosis not present

## 2018-12-31 DIAGNOSIS — Z89421 Acquired absence of other right toe(s): Secondary | ICD-10-CM | POA: Diagnosis not present

## 2019-01-01 DIAGNOSIS — Z4800 Encounter for change or removal of nonsurgical wound dressing: Secondary | ICD-10-CM | POA: Diagnosis not present

## 2019-01-01 DIAGNOSIS — I70209 Unspecified atherosclerosis of native arteries of extremities, unspecified extremity: Secondary | ICD-10-CM | POA: Diagnosis not present

## 2019-01-01 DIAGNOSIS — E1151 Type 2 diabetes mellitus with diabetic peripheral angiopathy without gangrene: Secondary | ICD-10-CM | POA: Diagnosis not present

## 2019-01-01 DIAGNOSIS — Z89421 Acquired absence of other right toe(s): Secondary | ICD-10-CM | POA: Diagnosis not present

## 2019-01-01 DIAGNOSIS — Z89612 Acquired absence of left leg above knee: Secondary | ICD-10-CM | POA: Diagnosis not present

## 2019-01-01 DIAGNOSIS — Z89411 Acquired absence of right great toe: Secondary | ICD-10-CM | POA: Diagnosis not present

## 2019-01-01 DIAGNOSIS — Z4781 Encounter for orthopedic aftercare following surgical amputation: Secondary | ICD-10-CM | POA: Diagnosis not present

## 2019-01-01 DIAGNOSIS — Z7984 Long term (current) use of oral hypoglycemic drugs: Secondary | ICD-10-CM | POA: Diagnosis not present

## 2019-01-01 DIAGNOSIS — I10 Essential (primary) hypertension: Secondary | ICD-10-CM | POA: Diagnosis not present

## 2019-01-02 ENCOUNTER — Telehealth (HOSPITAL_COMMUNITY): Payer: Self-pay | Admitting: Rehabilitation

## 2019-01-05 ENCOUNTER — Encounter: Payer: Self-pay | Admitting: Surgery

## 2019-01-05 ENCOUNTER — Ambulatory Visit (INDEPENDENT_AMBULATORY_CARE_PROVIDER_SITE_OTHER): Payer: Medicare HMO | Admitting: Surgery

## 2019-01-05 ENCOUNTER — Other Ambulatory Visit: Payer: Self-pay

## 2019-01-05 VITALS — BP 126/71 | HR 84 | Temp 97.2°F | Resp 20 | Ht 62.0 in | Wt 192.0 lb

## 2019-01-05 DIAGNOSIS — Z89431 Acquired absence of right foot: Secondary | ICD-10-CM

## 2019-01-05 MED ORDER — TRAMADOL HCL 50 MG PO TABS: 50.0000 mg | ORAL_TABLET | Freq: Four times a day (QID) | ORAL | 0 refills | Status: DC | PRN

## 2019-01-05 NOTE — Progress Notes (Signed)
Patient name: Danielle Buchanan MRN: [id number] DOB: 22-Mar-1956 Sex: female  REASON FOR VISIT:     post op  HISTORY OF PRESENT ILLNESS:   Danielle MASLOSKI is a 63 y.o. female who is status post redo right transmetatarsal amputation on 12/18/2018.  Prior to that she had undergone angiography with angioplasty.  We have been following her for a wound at her amputation site.  CURRENT MEDICATIONS:    Current Outpatient Medications  Medication Sig Dispense Refill  . amitriptyline (ELAVIL) 25 MG tablet Take 1 tablet (25 mg total) by mouth at bedtime. 30 tablet 0  . aspirin 81 MG EC tablet Take 1 tablet (81 mg total) by mouth daily. 30 tablet 0  . cephALEXin (KEFLEX) 500 MG capsule Take 1 capsule (500 mg total) by mouth 3 (three) times daily. 30 capsule 0  . collagenase (SANTYL) ointment Apply 1 application topically daily. 30 g 1  . gabapentin (NEURONTIN) 300 MG capsule     . Insulin Isophane & Regular Human (HUMULIN 70/30 KWIKPEN) (70-30) 100 UNIT/ML PEN Inject 70 Units into the skin 2 (two) times daily. Inject 70 units with breakfast and 60 units with dinner. Hold for CBG less than 60. Notify MD if CBG greater than 300 (DM) 15 mL 0  . LORazepam (ATIVAN) 0.5 MG tablet Take 1 tablet (0.5 mg total) by mouth 2 (two) times daily as needed for anxiety. 15 tablet 0  . losartan (COZAAR) 100 MG tablet Take 1 tablet (100 mg total) by mouth daily. 30 tablet 0  . metFORMIN (GLUCOPHAGE-XR) 500 MG 24 hr tablet     . Multiple Vitamin (MULTIVITAMIN WITH MINERALS) TABS tablet Place 1 tablet into feeding tube daily. 30 tablet 0  . nicotine polacrilex (NICORELIEF) 2 MG gum Take 1 each (2 mg total) by mouth daily as needed for smoking cessation. 30 tablet 0  . oxyCODONE-acetaminophen (PERCOCET/ROXICET) 5-325 MG tablet Take 1 tablet by mouth every 6 (six) hours as needed for severe pain. 30 tablet 0  . polyethylene glycol (MIRALAX / GLYCOLAX) packet Take 17 g by mouth daily as needed  for moderate constipation. 14 each 0  . pravastatin (PRAVACHOL) 20 MG tablet Take 2 tablets (40 mg total) by mouth daily. 30 tablet 0  . senna-docusate (SENOKOT-S) 8.6-50 MG tablet Take 1 tablet by mouth 2 (two) times daily. 60 tablet 0  . sertraline (ZOLOFT) 100 MG tablet Take 1 tablet (100 mg total) by mouth daily. 30 tablet 0  . traZODone (DESYREL) 50 MG tablet Take 1 tablet (50 mg total) by mouth at bedtime. 30 tablet 0  . TRUE METRIX BLOOD GLUCOSE TEST test strip USE TO CHECK YOUR BLOOD SUGAR TWICE A DAY (DX  E11.21) IN VITRO 90 DAYS    . Vitamin E 180 MG CAPS Take 180 mg by mouth daily. 30 capsule 0  . Zinc 50 MG TABS Take 1 tablet (50 mg total) by mouth daily. 30 tablet 0  . clopidogrel (PLAVIX) 75 MG tablet Take 1 tablet (75 mg total) by mouth daily with breakfast. (Patient not taking: Reported on 12/23/2018) 30 tablet 0  . traMADol (ULTRAM) 50 MG tablet Take 1 tablet (50 mg total) by mouth every 6 (six) hours as needed. 30 tablet 0   No current facility-administered medications for this visit.     REVIEW OF SYSTEMS:   [X]  denotes positive finding, [ ]  denotes negative finding Cardiac  Comments:  Chest pain or chest pressure:    Shortness of breath upon exertion:  Short of breath when lying flat:    Irregular heart rhythm:    Constitutional    Fever or chills:      PHYSICAL EXAM:   Vitals:   01/05/19 1105  BP: 126/71  Pulse: 84  Resp: 20  Temp: (!) 97.2 F (36.2 C)  SpO2: 95%  Weight: 87.1 kg  Height: 5\' 2"  (1.575 m)    GENERAL: The patient is a well-nourished female, in no acute distress. The vital signs are documented above. CARDIOVASCULAR: There is a regular rate and rhythm. PULMONARY: Non-labored respirations There is been breakdown of the amputation site.  The wound does not look like it is going to heal.  STUDIES:   None   MEDICAL ISSUES:   I discussed with the patient that I do not think this transmetatarsal amputation site is going to heal.  I  think she needs to be converted to below-knee amputation.  She would like more time to process this.  Currently her only symptom is pain.  I am giving her 30 tramadol to help with the pain which she only takes at night.  She will continue with the Santyl dressing changes.  She is coming back for repeat evaluation in 3 weeks.  Hopefully we can get her scheduled for below-knee amputation at that point.  Charlena Cross, MD, FACS [facility] [phone] Pager [phone]

## 2019-01-06 ENCOUNTER — Ambulatory Visit: Payer: Medicare HMO | Admitting: Infectious Disease

## 2019-01-06 DIAGNOSIS — Z4781 Encounter for orthopedic aftercare following surgical amputation: Secondary | ICD-10-CM | POA: Diagnosis not present

## 2019-01-06 DIAGNOSIS — I70209 Unspecified atherosclerosis of native arteries of extremities, unspecified extremity: Secondary | ICD-10-CM | POA: Diagnosis not present

## 2019-01-06 DIAGNOSIS — Z89612 Acquired absence of left leg above knee: Secondary | ICD-10-CM | POA: Diagnosis not present

## 2019-01-06 DIAGNOSIS — I10 Essential (primary) hypertension: Secondary | ICD-10-CM | POA: Diagnosis not present

## 2019-01-06 DIAGNOSIS — Z7984 Long term (current) use of oral hypoglycemic drugs: Secondary | ICD-10-CM | POA: Diagnosis not present

## 2019-01-06 DIAGNOSIS — Z4800 Encounter for change or removal of nonsurgical wound dressing: Secondary | ICD-10-CM | POA: Diagnosis not present

## 2019-01-06 DIAGNOSIS — Z89411 Acquired absence of right great toe: Secondary | ICD-10-CM | POA: Diagnosis not present

## 2019-01-06 DIAGNOSIS — E1151 Type 2 diabetes mellitus with diabetic peripheral angiopathy without gangrene: Secondary | ICD-10-CM | POA: Diagnosis not present

## 2019-01-06 DIAGNOSIS — Z89421 Acquired absence of other right toe(s): Secondary | ICD-10-CM | POA: Diagnosis not present

## 2019-01-07 DIAGNOSIS — Z89612 Acquired absence of left leg above knee: Secondary | ICD-10-CM | POA: Diagnosis not present

## 2019-01-07 DIAGNOSIS — I70209 Unspecified atherosclerosis of native arteries of extremities, unspecified extremity: Secondary | ICD-10-CM | POA: Diagnosis not present

## 2019-01-07 DIAGNOSIS — Z7984 Long term (current) use of oral hypoglycemic drugs: Secondary | ICD-10-CM | POA: Diagnosis not present

## 2019-01-07 DIAGNOSIS — I10 Essential (primary) hypertension: Secondary | ICD-10-CM | POA: Diagnosis not present

## 2019-01-07 DIAGNOSIS — Z89411 Acquired absence of right great toe: Secondary | ICD-10-CM | POA: Diagnosis not present

## 2019-01-07 DIAGNOSIS — Z89421 Acquired absence of other right toe(s): Secondary | ICD-10-CM | POA: Diagnosis not present

## 2019-01-07 DIAGNOSIS — Z4800 Encounter for change or removal of nonsurgical wound dressing: Secondary | ICD-10-CM | POA: Diagnosis not present

## 2019-01-07 DIAGNOSIS — E1151 Type 2 diabetes mellitus with diabetic peripheral angiopathy without gangrene: Secondary | ICD-10-CM | POA: Diagnosis not present

## 2019-01-07 DIAGNOSIS — Z4781 Encounter for orthopedic aftercare following surgical amputation: Secondary | ICD-10-CM | POA: Diagnosis not present

## 2019-01-08 DIAGNOSIS — Z4800 Encounter for change or removal of nonsurgical wound dressing: Secondary | ICD-10-CM | POA: Diagnosis not present

## 2019-01-08 DIAGNOSIS — I10 Essential (primary) hypertension: Secondary | ICD-10-CM | POA: Diagnosis not present

## 2019-01-08 DIAGNOSIS — Z89411 Acquired absence of right great toe: Secondary | ICD-10-CM | POA: Diagnosis not present

## 2019-01-08 DIAGNOSIS — Z89612 Acquired absence of left leg above knee: Secondary | ICD-10-CM | POA: Diagnosis not present

## 2019-01-08 DIAGNOSIS — I70209 Unspecified atherosclerosis of native arteries of extremities, unspecified extremity: Secondary | ICD-10-CM | POA: Diagnosis not present

## 2019-01-08 DIAGNOSIS — E1151 Type 2 diabetes mellitus with diabetic peripheral angiopathy without gangrene: Secondary | ICD-10-CM | POA: Diagnosis not present

## 2019-01-08 DIAGNOSIS — Z7984 Long term (current) use of oral hypoglycemic drugs: Secondary | ICD-10-CM | POA: Diagnosis not present

## 2019-01-08 DIAGNOSIS — Z4781 Encounter for orthopedic aftercare following surgical amputation: Secondary | ICD-10-CM | POA: Diagnosis not present

## 2019-01-08 DIAGNOSIS — Z89421 Acquired absence of other right toe(s): Secondary | ICD-10-CM | POA: Diagnosis not present

## 2019-01-09 ENCOUNTER — Other Ambulatory Visit: Payer: Self-pay | Admitting: Family Medicine

## 2019-01-09 DIAGNOSIS — I70209 Unspecified atherosclerosis of native arteries of extremities, unspecified extremity: Secondary | ICD-10-CM | POA: Diagnosis not present

## 2019-01-09 DIAGNOSIS — Z4781 Encounter for orthopedic aftercare following surgical amputation: Secondary | ICD-10-CM | POA: Diagnosis not present

## 2019-01-09 DIAGNOSIS — Z89411 Acquired absence of right great toe: Secondary | ICD-10-CM | POA: Diagnosis not present

## 2019-01-09 DIAGNOSIS — E1151 Type 2 diabetes mellitus with diabetic peripheral angiopathy without gangrene: Secondary | ICD-10-CM | POA: Diagnosis not present

## 2019-01-09 DIAGNOSIS — M542 Cervicalgia: Secondary | ICD-10-CM

## 2019-01-09 DIAGNOSIS — Z7984 Long term (current) use of oral hypoglycemic drugs: Secondary | ICD-10-CM | POA: Diagnosis not present

## 2019-01-09 DIAGNOSIS — Z89421 Acquired absence of other right toe(s): Secondary | ICD-10-CM | POA: Diagnosis not present

## 2019-01-09 DIAGNOSIS — Z4800 Encounter for change or removal of nonsurgical wound dressing: Secondary | ICD-10-CM | POA: Diagnosis not present

## 2019-01-09 DIAGNOSIS — I10 Essential (primary) hypertension: Secondary | ICD-10-CM | POA: Diagnosis not present

## 2019-01-09 DIAGNOSIS — Z89612 Acquired absence of left leg above knee: Secondary | ICD-10-CM | POA: Diagnosis not present

## 2019-01-12 DIAGNOSIS — I70209 Unspecified atherosclerosis of native arteries of extremities, unspecified extremity: Secondary | ICD-10-CM | POA: Diagnosis not present

## 2019-01-12 DIAGNOSIS — Z4800 Encounter for change or removal of nonsurgical wound dressing: Secondary | ICD-10-CM | POA: Diagnosis not present

## 2019-01-12 DIAGNOSIS — I10 Essential (primary) hypertension: Secondary | ICD-10-CM | POA: Diagnosis not present

## 2019-01-12 DIAGNOSIS — Z89421 Acquired absence of other right toe(s): Secondary | ICD-10-CM | POA: Diagnosis not present

## 2019-01-12 DIAGNOSIS — Z89612 Acquired absence of left leg above knee: Secondary | ICD-10-CM | POA: Diagnosis not present

## 2019-01-12 DIAGNOSIS — Z7984 Long term (current) use of oral hypoglycemic drugs: Secondary | ICD-10-CM | POA: Diagnosis not present

## 2019-01-12 DIAGNOSIS — Z89411 Acquired absence of right great toe: Secondary | ICD-10-CM | POA: Diagnosis not present

## 2019-01-12 DIAGNOSIS — Z4781 Encounter for orthopedic aftercare following surgical amputation: Secondary | ICD-10-CM | POA: Diagnosis not present

## 2019-01-12 DIAGNOSIS — E1151 Type 2 diabetes mellitus with diabetic peripheral angiopathy without gangrene: Secondary | ICD-10-CM | POA: Diagnosis not present

## 2019-01-14 ENCOUNTER — Telehealth: Payer: Self-pay | Admitting: *Deleted

## 2019-01-14 NOTE — Telephone Encounter (Signed)
Spoke with patient and caregiver Danielle Buchanan. Prefer to talk about scheduling surgery after office visit with Dr. Myra Gianotti on 01/26/2019.

## 2019-01-15 ENCOUNTER — Other Ambulatory Visit: Payer: Medicare HMO

## 2019-01-15 ENCOUNTER — Telehealth: Payer: Self-pay | Admitting: *Deleted

## 2019-01-15 DIAGNOSIS — Z4800 Encounter for change or removal of nonsurgical wound dressing: Secondary | ICD-10-CM | POA: Diagnosis not present

## 2019-01-15 DIAGNOSIS — I70209 Unspecified atherosclerosis of native arteries of extremities, unspecified extremity: Secondary | ICD-10-CM | POA: Diagnosis not present

## 2019-01-15 DIAGNOSIS — I10 Essential (primary) hypertension: Secondary | ICD-10-CM | POA: Diagnosis not present

## 2019-01-15 DIAGNOSIS — E1151 Type 2 diabetes mellitus with diabetic peripheral angiopathy without gangrene: Secondary | ICD-10-CM | POA: Diagnosis not present

## 2019-01-15 DIAGNOSIS — Z89421 Acquired absence of other right toe(s): Secondary | ICD-10-CM | POA: Diagnosis not present

## 2019-01-15 DIAGNOSIS — Z89411 Acquired absence of right great toe: Secondary | ICD-10-CM | POA: Diagnosis not present

## 2019-01-15 DIAGNOSIS — Z89612 Acquired absence of left leg above knee: Secondary | ICD-10-CM | POA: Diagnosis not present

## 2019-01-15 DIAGNOSIS — Z4781 Encounter for orthopedic aftercare following surgical amputation: Secondary | ICD-10-CM | POA: Diagnosis not present

## 2019-01-15 DIAGNOSIS — Z7984 Long term (current) use of oral hypoglycemic drugs: Secondary | ICD-10-CM | POA: Diagnosis not present

## 2019-01-15 NOTE — Telephone Encounter (Signed)
Call from [facility]. She states patient's right lower extremity worse and foul odor on visit today. Claims patient did not have fever or chills. [facility] nurse spoke with Tammy Sours caregiver yesterday to schedule right BKA. Patient did not want to proceed with scheduling this surgery until office visit with Dr. Myra Gianotti on 01/26/2019. I left a message on voice mail for Home health nurse to check on Friday if possible and patient should report to ER for any fever, chills or acute changes. I called and spoke with Tammy Sours and told him the same. He will call Home health also for wound check for Friday.

## 2019-01-16 DIAGNOSIS — Z89411 Acquired absence of right great toe: Secondary | ICD-10-CM | POA: Diagnosis not present

## 2019-01-16 DIAGNOSIS — Z89421 Acquired absence of other right toe(s): Secondary | ICD-10-CM | POA: Diagnosis not present

## 2019-01-16 DIAGNOSIS — Z4781 Encounter for orthopedic aftercare following surgical amputation: Secondary | ICD-10-CM | POA: Diagnosis not present

## 2019-01-16 DIAGNOSIS — E1151 Type 2 diabetes mellitus with diabetic peripheral angiopathy without gangrene: Secondary | ICD-10-CM | POA: Diagnosis not present

## 2019-01-16 DIAGNOSIS — Z89612 Acquired absence of left leg above knee: Secondary | ICD-10-CM | POA: Diagnosis not present

## 2019-01-16 DIAGNOSIS — Z7984 Long term (current) use of oral hypoglycemic drugs: Secondary | ICD-10-CM | POA: Diagnosis not present

## 2019-01-16 DIAGNOSIS — I10 Essential (primary) hypertension: Secondary | ICD-10-CM | POA: Diagnosis not present

## 2019-01-16 DIAGNOSIS — Z4800 Encounter for change or removal of nonsurgical wound dressing: Secondary | ICD-10-CM | POA: Diagnosis not present

## 2019-01-16 DIAGNOSIS — I70209 Unspecified atherosclerosis of native arteries of extremities, unspecified extremity: Secondary | ICD-10-CM | POA: Diagnosis not present

## 2019-01-17 ENCOUNTER — Emergency Department (HOSPITAL_COMMUNITY): Payer: Medicare HMO

## 2019-01-17 ENCOUNTER — Inpatient Hospital Stay (HOSPITAL_COMMUNITY)
Admission: EM | Admit: 2019-01-17 | Discharge: 2019-01-23 | DRG: 475 | Disposition: A | Discharge status: Rehabilitation Facility | Payer: Medicare HMO | Attending: Vascular Surgery | Admitting: Vascular Surgery

## 2019-01-17 ENCOUNTER — Encounter (HOSPITAL_COMMUNITY): Payer: Self-pay | Admitting: Emergency Medicine

## 2019-01-17 ENCOUNTER — Other Ambulatory Visit: Payer: Self-pay

## 2019-01-17 DIAGNOSIS — Z8249 Family history of ischemic heart disease and other diseases of the circulatory system: Secondary | ICD-10-CM

## 2019-01-17 DIAGNOSIS — Z794 Long term (current) use of insulin: Secondary | ICD-10-CM | POA: Diagnosis not present

## 2019-01-17 DIAGNOSIS — T8753 Necrosis of amputation stump, right lower extremity: Principal | ICD-10-CM | POA: Diagnosis present

## 2019-01-17 DIAGNOSIS — I2510 Atherosclerotic heart disease of native coronary artery without angina pectoris: Secondary | ICD-10-CM | POA: Diagnosis not present

## 2019-01-17 DIAGNOSIS — Z79891 Long term (current) use of opiate analgesic: Secondary | ICD-10-CM

## 2019-01-17 DIAGNOSIS — L97916 Non-pressure chronic ulcer of unspecified part of right lower leg with bone involvement without evidence of necrosis: Secondary | ICD-10-CM | POA: Diagnosis not present

## 2019-01-17 DIAGNOSIS — J309 Allergic rhinitis, unspecified: Secondary | ICD-10-CM | POA: Diagnosis present

## 2019-01-17 DIAGNOSIS — Z89421 Acquired absence of other right toe(s): Secondary | ICD-10-CM | POA: Diagnosis not present

## 2019-01-17 DIAGNOSIS — E1169 Type 2 diabetes mellitus with other specified complication: Secondary | ICD-10-CM | POA: Diagnosis not present

## 2019-01-17 DIAGNOSIS — G4730 Sleep apnea, unspecified: Secondary | ICD-10-CM | POA: Diagnosis present

## 2019-01-17 DIAGNOSIS — F329 Major depressive disorder, single episode, unspecified: Secondary | ICD-10-CM | POA: Diagnosis present

## 2019-01-17 DIAGNOSIS — Z8349 Family history of other endocrine, nutritional and metabolic diseases: Secondary | ICD-10-CM

## 2019-01-17 DIAGNOSIS — I96 Gangrene, not elsewhere classified: Secondary | ICD-10-CM

## 2019-01-17 DIAGNOSIS — G4700 Insomnia, unspecified: Secondary | ICD-10-CM | POA: Diagnosis present

## 2019-01-17 DIAGNOSIS — Z811 Family history of alcohol abuse and dependence: Secondary | ICD-10-CM | POA: Diagnosis not present

## 2019-01-17 DIAGNOSIS — Z6837 Body mass index (BMI) 37.0-37.9, adult: Secondary | ICD-10-CM

## 2019-01-17 DIAGNOSIS — Z89612 Acquired absence of left leg above knee: Secondary | ICD-10-CM | POA: Diagnosis not present

## 2019-01-17 DIAGNOSIS — T819XXA Unspecified complication of procedure, initial encounter: Secondary | ICD-10-CM | POA: Diagnosis not present

## 2019-01-17 DIAGNOSIS — I10 Essential (primary) hypertension: Secondary | ICD-10-CM | POA: Diagnosis present

## 2019-01-17 DIAGNOSIS — Z4781 Encounter for orthopedic aftercare following surgical amputation: Secondary | ICD-10-CM | POA: Diagnosis not present

## 2019-01-17 DIAGNOSIS — Z91011 Allergy to milk products: Secondary | ICD-10-CM

## 2019-01-17 DIAGNOSIS — Z7983 Long term (current) use of bisphosphonates: Secondary | ICD-10-CM | POA: Diagnosis not present

## 2019-01-17 DIAGNOSIS — S81801A Unspecified open wound, right lower leg, initial encounter: Secondary | ICD-10-CM | POA: Diagnosis not present

## 2019-01-17 DIAGNOSIS — E1151 Type 2 diabetes mellitus with diabetic peripheral angiopathy without gangrene: Secondary | ICD-10-CM | POA: Diagnosis not present

## 2019-01-17 DIAGNOSIS — L089 Local infection of the skin and subcutaneous tissue, unspecified: Secondary | ICD-10-CM | POA: Diagnosis present

## 2019-01-17 DIAGNOSIS — I70239 Atherosclerosis of native arteries of right leg with ulceration of unspecified site: Secondary | ICD-10-CM | POA: Diagnosis not present

## 2019-01-17 DIAGNOSIS — E1152 Type 2 diabetes mellitus with diabetic peripheral angiopathy with gangrene: Secondary | ICD-10-CM | POA: Diagnosis not present

## 2019-01-17 DIAGNOSIS — Z716 Tobacco abuse counseling: Secondary | ICD-10-CM | POA: Diagnosis not present

## 2019-01-17 DIAGNOSIS — Z87891 Personal history of nicotine dependence: Secondary | ICD-10-CM

## 2019-01-17 DIAGNOSIS — E785 Hyperlipidemia, unspecified: Secondary | ICD-10-CM | POA: Diagnosis present

## 2019-01-17 DIAGNOSIS — Z7982 Long term (current) use of aspirin: Secondary | ICD-10-CM | POA: Diagnosis not present

## 2019-01-17 DIAGNOSIS — Z881 Allergy status to other antibiotic agents status: Secondary | ICD-10-CM

## 2019-01-17 DIAGNOSIS — Z89511 Acquired absence of right leg below knee: Secondary | ICD-10-CM | POA: Diagnosis not present

## 2019-01-17 DIAGNOSIS — G8918 Other acute postprocedural pain: Secondary | ICD-10-CM | POA: Diagnosis not present

## 2019-01-17 DIAGNOSIS — G4733 Obstructive sleep apnea (adult) (pediatric): Secondary | ICD-10-CM | POA: Diagnosis not present

## 2019-01-17 DIAGNOSIS — K5900 Constipation, unspecified: Secondary | ICD-10-CM | POA: Diagnosis not present

## 2019-01-17 DIAGNOSIS — E669 Obesity, unspecified: Secondary | ICD-10-CM | POA: Diagnosis not present

## 2019-01-17 DIAGNOSIS — E1142 Type 2 diabetes mellitus with diabetic polyneuropathy: Secondary | ICD-10-CM | POA: Diagnosis present

## 2019-01-17 DIAGNOSIS — I739 Peripheral vascular disease, unspecified: Secondary | ICD-10-CM | POA: Diagnosis not present

## 2019-01-17 DIAGNOSIS — T8743 Infection of amputation stump, right lower extremity: Secondary | ICD-10-CM | POA: Diagnosis not present

## 2019-01-17 HISTORY — DX: Gangrene, not elsewhere classified: I96

## 2019-01-17 LAB — CBC WITH DIFFERENTIAL/PLATELET
Abs Immature Granulocytes: 0.25 10*3/uL — ABNORMAL HIGH (ref 0.00–0.07)
Basophils Absolute: 0.1 10*3/uL (ref 0.0–0.1)
Basophils Relative: 1 %
Eosinophils Absolute: 0.2 10*3/uL (ref 0.0–0.5)
Eosinophils Relative: 2 %
HCT: 40 % (ref 36.0–46.0)
Hemoglobin: 12.6 g/dL (ref 12.0–15.0)
Immature Granulocytes: 3 %
Lymphocytes Relative: 12 %
Lymphs Abs: 1.2 10*3/uL (ref 0.7–4.0)
MCH: 27.3 pg (ref 26.0–34.0)
MCHC: 31.5 g/dL (ref 30.0–36.0)
MCV: 86.6 fL (ref 80.0–100.0)
Monocytes Absolute: 0.5 10*3/uL (ref 0.1–1.0)
Monocytes Relative: 5 %
Neutro Abs: 7.8 10*3/uL — ABNORMAL HIGH (ref 1.7–7.7)
Neutrophils Relative %: 77 %
Platelets: 235 10*3/uL (ref 150–400)
RBC: 4.62 MIL/uL (ref 3.87–5.11)
RDW: 13.7 % (ref 11.5–15.5)
WBC: 10 10*3/uL (ref 4.0–10.5)
nRBC: 0 % (ref 0.0–0.2)

## 2019-01-17 LAB — BASIC METABOLIC PANEL
Anion gap: 14 (ref 5–15)
BUN: 13 mg/dL (ref 8–23)
CO2: 22 mmol/L (ref 22–32)
Calcium: 9.1 mg/dL (ref 8.9–10.3)
Chloride: 95 mmol/L — ABNORMAL LOW (ref 98–111)
Creatinine, Ser: 0.77 mg/dL (ref 0.44–1.00)
GFR calc Af Amer: >60 mL/min (ref >60)
GFR calc non Af Amer: >60 mL/min (ref >60)
Glucose, Bld: 304 mg/dL — ABNORMAL HIGH (ref 70–99)
Potassium: 3.9 mmol/L (ref 3.5–5.1)
Sodium: 131 mmol/L — ABNORMAL LOW (ref 135–145)

## 2019-01-17 LAB — SEDIMENTATION RATE: Sed Rate: 63 mm/hr — ABNORMAL HIGH (ref 0–22)

## 2019-01-17 LAB — SURGICAL PCR SCREEN
MRSA, PCR: NEGATIVE
Staphylococcus aureus: NEGATIVE

## 2019-01-17 LAB — CREATININE, SERUM
Creatinine, Ser: 0.69 mg/dL (ref 0.44–1.00)
GFR calc Af Amer: >60 mL/min (ref >60)
GFR calc non Af Amer: >60 mL/min (ref >60)

## 2019-01-17 LAB — CBC
HCT: 38.4 % (ref 36.0–46.0)
Hemoglobin: 12.2 g/dL (ref 12.0–15.0)
MCH: 27.3 pg (ref 26.0–34.0)
MCHC: 31.8 g/dL (ref 30.0–36.0)
MCV: 85.9 fL (ref 80.0–100.0)
Platelets: 234 10*3/uL (ref 150–400)
RBC: 4.47 MIL/uL (ref 3.87–5.11)
RDW: 13.7 % (ref 11.5–15.5)
WBC: 9.7 10*3/uL (ref 4.0–10.5)
nRBC: 0 % (ref 0.0–0.2)

## 2019-01-17 LAB — GLUCOSE, CAPILLARY
Glucose-Capillary: 207 mg/dL — ABNORMAL HIGH (ref 70–99)
Glucose-Capillary: 205 mg/dL — ABNORMAL HIGH (ref 70–99)

## 2019-01-17 LAB — C-REACTIVE PROTEIN: CRP: 10.5 mg/dL — ABNORMAL HIGH (ref <1.0)

## 2019-01-17 MED ORDER — SERTRALINE HCL 100 MG PO TABS
100.0000 mg | ORAL_TABLET | Freq: Every day | ORAL | Status: DC
  Administered 2019-01-18 – 2019-01-23 (×5): 100 mg via ORAL
  Filled 2019-01-17 (×5): qty 1

## 2019-01-17 MED ORDER — OXYCODONE HCL 5 MG PO TABS
5.0000 mg | ORAL_TABLET | ORAL | Status: DC | PRN
  Administered 2019-01-17: 22:00:00 10 mg via ORAL
  Administered 2019-01-18: 5 mg via ORAL
  Administered 2019-01-19 – 2019-01-21 (×6): 10 mg via ORAL
  Filled 2019-01-17 (×4): qty 2
  Filled 2019-01-17: qty 1
  Filled 2019-01-17 (×3): qty 2

## 2019-01-17 MED ORDER — INSULIN ISOPHANE & REGULAR (HUMAN 70-30)100 UNIT/ML KWIKPEN: 70.0000 [IU] | PEN_INJECTOR | Freq: Two times a day (BID) | SUBCUTANEOUS | Status: DC

## 2019-01-17 MED ORDER — INSULIN ASPART 100 UNIT/ML ~~LOC~~ SOLN
0.0000 [IU] | Freq: Three times a day (TID) | SUBCUTANEOUS | Status: DC
  Administered 2019-01-17: 19:00:00 7 [IU] via SUBCUTANEOUS
  Administered 2019-01-18: 4 [IU] via SUBCUTANEOUS
  Administered 2019-01-18 (×2): 7 [IU] via SUBCUTANEOUS
  Administered 2019-01-19: 06:00:00 11 [IU] via SUBCUTANEOUS
  Administered 2019-01-19: 16:00:00 15 [IU] via SUBCUTANEOUS
  Administered 2019-01-20 – 2019-01-21 (×3): 4 [IU] via SUBCUTANEOUS
  Administered 2019-01-21: 3 [IU] via SUBCUTANEOUS
  Administered 2019-01-21: 18:00:00 4 [IU] via SUBCUTANEOUS
  Administered 2019-01-22: 11 [IU] via SUBCUTANEOUS
  Administered 2019-01-22: 4 [IU] via SUBCUTANEOUS
  Administered 2019-01-22: 06:00:00 11 [IU] via SUBCUTANEOUS
  Administered 2019-01-23: 7 [IU] via SUBCUTANEOUS
  Administered 2019-01-23: 4 [IU] via SUBCUTANEOUS

## 2019-01-17 MED ORDER — ALUM & MAG HYDROXIDE-SIMETH 200-200-20 MG/5ML PO SUSP: 15.0000 mL | ORAL | Status: DC | PRN

## 2019-01-17 MED ORDER — HEPARIN SODIUM (PORCINE) 5000 UNIT/ML IJ SOLN
5000.0000 [IU] | Freq: Three times a day (TID) | INTRAMUSCULAR | Status: DC
  Administered 2019-01-17 – 2019-01-23 (×17): 5000 [IU] via SUBCUTANEOUS
  Filled 2019-01-17 (×17): qty 1

## 2019-01-17 MED ORDER — POTASSIUM CHLORIDE CRYS ER 20 MEQ PO TBCR: 20.0000 meq | EXTENDED_RELEASE_TABLET | Freq: Once | ORAL | Status: DC

## 2019-01-17 MED ORDER — COLLAGENASE 250 UNIT/GM EX OINT
1.0000 "application " | TOPICAL_OINTMENT | Freq: Every day | CUTANEOUS | Status: DC
  Administered 2019-01-18: 1 via TOPICAL
  Filled 2019-01-17 (×2): qty 30

## 2019-01-17 MED ORDER — ONDANSETRON HCL 4 MG/2ML IJ SOLN: 4.0000 mg | Freq: Four times a day (QID) | INTRAMUSCULAR | Status: DC | PRN

## 2019-01-17 MED ORDER — GABAPENTIN 300 MG PO CAPS
300.0000 mg | ORAL_CAPSULE | Freq: Every day | ORAL | Status: DC
  Administered 2019-01-17 – 2019-01-22 (×6): 300 mg via ORAL
  Filled 2019-01-17 (×6): qty 1

## 2019-01-17 MED ORDER — LABETALOL HCL 5 MG/ML IV SOLN
10.0000 mg | INTRAVENOUS | Status: DC | PRN
  Administered 2019-01-23: 07:00:00 10 mg via INTRAVENOUS
  Filled 2019-01-17: qty 4

## 2019-01-17 MED ORDER — METFORMIN HCL ER 500 MG PO TB24
500.0000 mg | ORAL_TABLET | Freq: Every day | ORAL | Status: DC
  Filled 2019-01-17 (×8): qty 1

## 2019-01-17 MED ORDER — SODIUM CHLORIDE 0.9 % IV SOLN: 250.0000 mL | INTRAVENOUS | Status: DC | PRN

## 2019-01-17 MED ORDER — SODIUM CHLORIDE 0.9% FLUSH: 3.0000 mL | INTRAVENOUS | Status: DC | PRN

## 2019-01-17 MED ORDER — HYDRALAZINE HCL 20 MG/ML IJ SOLN: 5.0000 mg | INTRAMUSCULAR | Status: DC | PRN

## 2019-01-17 MED ORDER — LORAZEPAM 0.5 MG PO TABS: 0.5000 mg | ORAL_TABLET | Freq: Two times a day (BID) | ORAL | Status: DC | PRN

## 2019-01-17 MED ORDER — PRAVASTATIN SODIUM 40 MG PO TABS
40.0000 mg | ORAL_TABLET | Freq: Every day | ORAL | Status: DC
  Administered 2019-01-18 – 2019-01-23 (×5): 40 mg via ORAL
  Filled 2019-01-17 (×5): qty 1

## 2019-01-17 MED ORDER — MORPHINE SULFATE (PF) 2 MG/ML IV SOLN
2.0000 mg | INTRAVENOUS | Status: DC | PRN
  Administered 2019-01-19: 19:00:00 2 mg via INTRAVENOUS
  Administered 2019-01-19 – 2019-01-20 (×2): 4 mg via INTRAVENOUS
  Administered 2019-01-20: 23:00:00 2 mg via INTRAVENOUS
  Administered 2019-01-20 (×3): 4 mg via INTRAVENOUS
  Administered 2019-01-20: 2 mg via INTRAVENOUS
  Administered 2019-01-21 (×3): 4 mg via INTRAVENOUS
  Filled 2019-01-17 (×8): qty 2
  Filled 2019-01-17 (×3): qty 1

## 2019-01-17 MED ORDER — ADULT MULTIVITAMIN W/MINERALS CH
1.0000 | ORAL_TABLET | Freq: Every day | ORAL | Status: DC
  Administered 2019-01-18 – 2019-01-23 (×5): 1
  Filled 2019-01-17 (×5): qty 1

## 2019-01-17 MED ORDER — PHENOL 1.4 % MT LIQD
1.0000 | OROMUCOSAL | Status: DC | PRN
  Filled 2019-01-17: qty 177

## 2019-01-17 MED ORDER — POLYETHYLENE GLYCOL 3350 17 G PO PACK: 17.0000 g | PACK | Freq: Every day | ORAL | Status: DC | PRN

## 2019-01-17 MED ORDER — METOPROLOL TARTRATE 5 MG/5ML IV SOLN: 2.0000 mg | INTRAVENOUS | Status: DC | PRN

## 2019-01-17 MED ORDER — INSULIN ASPART PROT & ASPART (70-30 MIX) 100 UNIT/ML ~~LOC~~ SUSP
70.0000 [IU] | Freq: Every day | SUBCUTANEOUS | Status: DC
  Administered 2019-01-18 – 2019-01-23 (×5): 70 [IU] via SUBCUTANEOUS
  Filled 2019-01-17: qty 10

## 2019-01-17 MED ORDER — GUAIFENESIN-DM 100-10 MG/5ML PO SYRP: 15.0000 mL | ORAL_SOLUTION | ORAL | Status: DC | PRN

## 2019-01-17 MED ORDER — ASPIRIN EC 81 MG PO TBEC
81.0000 mg | DELAYED_RELEASE_TABLET | Freq: Every day | ORAL | Status: DC
  Administered 2019-01-18 – 2019-01-23 (×5): 81 mg via ORAL
  Filled 2019-01-17 (×6): qty 1

## 2019-01-17 MED ORDER — PANTOPRAZOLE SODIUM 40 MG PO TBEC
40.0000 mg | DELAYED_RELEASE_TABLET | Freq: Every day | ORAL | Status: DC
  Administered 2019-01-18 – 2019-01-23 (×5): 40 mg via ORAL
  Filled 2019-01-17 (×5): qty 1

## 2019-01-17 MED ORDER — NICOTINE POLACRILEX 2 MG MT GUM
2.0000 mg | CHEWING_GUM | Freq: Every day | OROMUCOSAL | Status: DC | PRN
  Administered 2019-01-20: 2 mg via ORAL
  Filled 2019-01-17 (×3): qty 1

## 2019-01-17 MED ORDER — ACETAMINOPHEN 325 MG RE SUPP: 325.0000 mg | RECTAL | Status: DC | PRN

## 2019-01-17 MED ORDER — LOSARTAN POTASSIUM 50 MG PO TABS
100.0000 mg | ORAL_TABLET | Freq: Every day | ORAL | Status: DC
  Administered 2019-01-18 – 2019-01-23 (×5): 100 mg via ORAL
  Filled 2019-01-17 (×5): qty 2

## 2019-01-17 MED ORDER — TRAZODONE HCL 50 MG PO TABS
50.0000 mg | ORAL_TABLET | Freq: Every day | ORAL | Status: DC
  Administered 2019-01-17 – 2019-01-22 (×6): 50 mg via ORAL
  Filled 2019-01-17 (×6): qty 1

## 2019-01-17 MED ORDER — INSULIN ASPART PROT & ASPART (70-30 MIX) 100 UNIT/ML ~~LOC~~ SUSP
60.0000 [IU] | Freq: Every day | SUBCUTANEOUS | Status: DC
  Administered 2019-01-18 – 2019-01-22 (×4): 60 [IU] via SUBCUTANEOUS
  Filled 2019-01-17: qty 10

## 2019-01-17 MED ORDER — AMITRIPTYLINE HCL 50 MG PO TABS
25.0000 mg | ORAL_TABLET | Freq: Every day | ORAL | Status: DC
  Administered 2019-01-17 – 2019-01-22 (×6): 25 mg via ORAL
  Filled 2019-01-17 (×6): qty 1

## 2019-01-17 MED ORDER — DOCUSATE SODIUM 100 MG PO CAPS
100.0000 mg | ORAL_CAPSULE | Freq: Two times a day (BID) | ORAL | Status: DC
  Administered 2019-01-18 – 2019-01-22 (×5): 100 mg via ORAL
  Filled 2019-01-17 (×10): qty 1

## 2019-01-17 MED ORDER — ZINC 50 MG PO TABS: 50.0000 mg | ORAL_TABLET | Freq: Every day | ORAL | Status: DC

## 2019-01-17 MED ORDER — MUPIROCIN 2 % EX OINT
1.0000 "application " | TOPICAL_OINTMENT | Freq: Two times a day (BID) | CUTANEOUS | Status: AC
  Administered 2019-01-17 – 2019-01-18 (×2): 1 via NASAL
  Filled 2019-01-17 (×2): qty 22

## 2019-01-17 MED ORDER — ACETAMINOPHEN 325 MG PO TABS
325.0000 mg | ORAL_TABLET | ORAL | Status: DC | PRN
  Filled 2019-01-17: qty 2

## 2019-01-17 MED ORDER — SODIUM CHLORIDE 0.9% FLUSH
3.0000 mL | Freq: Two times a day (BID) | INTRAVENOUS | Status: DC
  Administered 2019-01-18 – 2019-01-23 (×7): 3 mL via INTRAVENOUS

## 2019-01-17 MED ORDER — PIPERACILLIN-TAZOBACTAM 3.375 G IVPB
3.3750 g | Freq: Three times a day (TID) | INTRAVENOUS | Status: DC
  Administered 2019-01-17 – 2019-01-23 (×17): 3.375 g via INTRAVENOUS
  Filled 2019-01-17 (×20): qty 50

## 2019-01-17 MED ORDER — ZINC SULFATE 220 (50 ZN) MG PO CAPS
220.0000 mg | ORAL_CAPSULE | Freq: Every day | ORAL | Status: DC
  Administered 2019-01-18 – 2019-01-23 (×5): 220 mg via ORAL
  Filled 2019-01-17 (×5): qty 1

## 2019-01-17 NOTE — Plan of Care (Signed)
Care plans reviewed and patient is progressing.  

## 2019-01-17 NOTE — Progress Notes (Signed)
Attempted to call report

## 2019-01-17 NOTE — H&P (Signed)
Patient name: Danielle Buchanan Agro MRN: [id number] DOB: 24-Jan-1956 Sex: female  HPI: Danielle Buchanan Butkus is a 63 y.o. female, s/p PTA of TPT and peroneal artery by Dr Myra GianottiBrabham 2/20.  She subsequently had an open TMA and has been doing local wound care.  She was sent to ER today with deterioration of wound and foul smell.  Her glucose has also been elevated.  She does not really complain of pain.  She has no fever or chills. Other medical problems include depression, sleep apnea, dyslipidemia which have been stable.  Past Medical History:  Diagnosis Date  . Allergic rhinitis   . Depression   . Diabetes (HCC)   . Dyslipidemia   . HTN (hypertension)   . Insomnia   . OSA (obstructive sleep apnea)    no cpap  . Urinary incontinence    Past Surgical History:  Procedure Laterality Date  . ABDOMINAL AORTOGRAM W/LOWER EXTREMITY N/A 06/24/2017   Procedure: ABDOMINAL AORTOGRAM W/LOWER EXTREMITY;  Surgeon: Maeola Harmanain, Brandon Christopher, MD;  [facility];  Service: Cardiovascular;  Laterality: N/A;  . ABDOMINAL AORTOGRAM W/LOWER EXTREMITY Right 10/09/2017   Procedure: ABDOMINAL AORTOGRAM W/LOWER EXTREMITY;  Surgeon: Maeola Harmanain, Brandon Christopher, MD;  [facility];  Service: Cardiovascular;  Laterality: Right;  . AMPUTATION Left 07/23/2017   Procedure: AMPUTATION  BELOW KNEE;  Surgeon: Maeola Harmanain, Brandon Christopher, MD;  [facility];  Service: Vascular;  Laterality: Left;  . AMPUTATION Left 08/12/2017   Procedure: REVISION BELOW KNEE;  Surgeon: Maeola Harmanain, Brandon Christopher, MD;  [facility];  Service: Vascular;  Laterality: Left;  . AMPUTATION Left 08/27/2017   Procedure: left ABOVE KNEE AMPUTATION;  Surgeon: Maeola Harmanain, Brandon Christopher, MD;  [facility];  Service: Vascular;  Laterality: Left;  . AMPUTATION Left 08/30/2017   Procedure: REVISION AMPUTATION ABOVE KNEE;  Surgeon: Nada LibmanBrabham, Vance W, MD;  [facility];  Service: Vascular;  Laterality: Left;  . AMPUTATION Right  11/04/2017   Procedure: AMPUTATION RIGHT FOURTH TOE;  Surgeon: Maeola Harmanain, Brandon Christopher, MD;  [facility];  Service: Vascular;  Laterality: Right;  . AMPUTATION Right 11/12/2018   Procedure: AMPUTATION OF second, third and fifth toes;  Surgeon: Nada LibmanBrabham, Vance W, MD;  [facility];  Service: Vascular;  Laterality: Right;  . APPLICATION OF WOUND VAC Left 08/27/2017   Procedure: APPLICATION OF WOUND VAC;  Surgeon: Maeola Harmanain, Brandon Christopher, MD;  [facility];  Service: Vascular;  Laterality: Left;  . CARPAL TUNNEL RELEASE Right   . COLONOSCOPY    . LOWER EXTREMITY ANGIOGRAM Right 11/12/2018   Procedure: ANGIOGRAM OF Right LEG;  Surgeon: Nada LibmanBrabham, Vance W, MD;  [facility];  Service: Vascular;  Laterality: Right;  . LOWER EXTREMITY INTERVENTION Right 10/09/2017   Procedure: LOWER EXTREMITY INTERVENTION;  Surgeon: Maeola Harmanain, Brandon Christopher, MD;  [facility];  Service: Cardiovascular;  Laterality: Right;  . TOE AMPUTATION Right 11/12/2018   2ND 3RD & 5TH TOE   . TRANSMETATARSAL AMPUTATION Right 11/19/2018   Procedure: TRANSMETATARSAL AMPUTATION REVISION;  Surgeon: Nada LibmanBrabham, Vance W, MD;  [facility];  Service: Vascular;  Laterality: Right;    Family History  Problem Relation Age of Onset  . Heart attack Mother   . Alcohol abuse Mother   . Heart attack Father   . Alcohol abuse Father   . Heart attack Sister   . Heart attack Brother   . Hypercholesterolemia Sister   . Diabetes Neg Hx     SOCIAL  HISTORY: Social History   Socioeconomic History  . Marital status: Single    Spouse name: Not on file  . Number of children: Not on file  . Years of education: Not on file  . Highest education level: Not on file  Occupational History  . Not on file  Social Needs  . Financial resource strain: Not on file  . Food insecurity:    Worry: Not on file    Inability: Not on file  . Transportation needs:    Medical: Not on file    Non-medical: Not on file  Tobacco Use   . Smoking status: Former Smoker    Packs/day: 1.00    Years: 51.00    Pack years: 51.00    Types: Cigarettes    Last attempt to quit: 08/08/2018    Years since quitting: 0.4  . Smokeless tobacco: Never Used  Substance and Sexual Activity  . Alcohol use: No    Alcohol/week: 0.0 standard drinks  . Drug use: No  . Sexual activity: Not on file  Lifestyle  . Physical activity:    Days per week: Not on file    Minutes per session: Not on file  . Stress: Not on file  Relationships  . Social connections:    Talks on phone: Not on file    Gets together: Not on file    Attends religious service: Not on file    Active member of club or organization: Not on file    Attends meetings of clubs or organizations: Not on file    Relationship status: Not on file  . Intimate partner violence:    Fear of current or ex partner: Not on file    Emotionally abused: Not on file    Physically abused: Not on file    Forced sexual activity: Not on file  Other Topics Concern  . Not on file  Social History Narrative   Lives with sister in an apartment on the first floor.  Has no children.    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    Education: 3rd grade.    Allergies  Allergen Reactions  . Milk-Related Compounds Other (See Comments)    incontinence  . Doxycycline Rash    Current Facility-Administered Medications  Medication Dose Route Frequency Provider Last Rate Last Dose  . 0.9 %  sodium chloride infusion  250 mL Intravenous PRN Sherren Kerns, MD      . acetaminophen (TYLENOL) tablet 325-650 mg  325-650 mg Oral Q4H PRN Sherren Kerns, MD       Or  . acetaminophen (TYLENOL) suppository 325-650 mg  325-650 mg Rectal Q4H PRN Sherren Kerns, MD      . alum & mag hydroxide-simeth (MAALOX/MYLANTA) 200-200-20 MG/5ML suspension 15-30 mL  15-30 mL Oral Q2H PRN Sherren Kerns, MD      . amitriptyline (ELAVIL) tablet 25 mg  25 mg Oral QHS Sherren Kerns, MD      . aspirin EC tablet 81 mg  81 mg Oral Daily Jaxsyn Azam E, MD      . collagenase (SANTYL) ointment 1 application  1 application Topical Daily Hailly Fess E, MD      . docusate sodium (COLACE) capsule 100 mg  100 mg Oral BID Sherren Kerns, MD      . gabapentin (NEURONTIN) capsule 300 mg  300 mg Oral QHS Sherren Kerns, MD      .  guaiFENesin-dextromethorphan (ROBITUSSIN DM) 100-10 MG/5ML syrup 15 mL  15 mL Oral Q4H PRN Sherren Kerns, MD      . heparin injection 5,000 Units  5,000 Units Subcutaneous Q8H Shakir Petrosino E, MD      . hydrALAZINE (APRESOLINE) injection 5 mg  5 mg Intravenous Q20 Min PRN Diangelo Radel, Janetta Hora, MD      . insulin aspart (novoLOG) injection 0-20 Units  0-20 Units Subcutaneous TID WC Maudy Yonan, Janetta Hora, MD      . Insulin Isophane & Regular Human (HUMULIN 70/30 MIX) (70-30) 100 UNIT/ML 70 Units  70 Units Subcutaneous BID Sherren Kerns, MD      . labetalol (NORMODYNE,TRANDATE) injection 10 mg  10 mg Intravenous Q10 min PRN Sherren Kerns, MD      . LORazepam (ATIVAN) tablet 0.5 mg  0.5 mg Oral BID PRN Sherren Kerns, MD      . losartan (COZAAR) tablet 100 mg  100 mg Oral Daily Sherren Kerns, MD      . Melene Muller ON 01/18/2019] metFORMIN (GLUCOPHAGE-XR) 24 hr tablet 500 mg  500 mg Oral Q breakfast Agnes Probert, Janetta Hora, MD      . metoprolol tartrate (LOPRESSOR) injection 2-5 mg  2-5 mg Intravenous Q2H PRN Sherren Kerns, MD      . morphine 2 MG/ML injection 2-5 mg  2-5 mg Intravenous Q1H PRN Sherren Kerns, MD      . multivitamin with minerals tablet 1 tablet  1 tablet Per Tube Daily Abdulhadi Stopa, Janetta Hora, MD      . nicotine polacrilex (NICORETTE) gum 2 mg  2 mg Oral Daily PRN Sherren Kerns, MD      . ondansetron [facility]) injection 4 mg  4 mg Intravenous Q6H PRN Sherren Kerns, MD      . oxyCODONE (Oxy IR/ROXICODONE) immediate release tablet 5-10 mg  5-10 mg Oral Q4H PRN Sherren Kerns, MD      . pantoprazole (PROTONIX) EC  tablet 40 mg  40 mg Oral Daily Rad Gramling E, MD      . phenol (CHLORASEPTIC) mouth spray 1 spray  1 spray Mouth/Throat PRN Sherren Kerns, MD      . piperacillin-tazobactam (ZOSYN) IVPB 3.375 g  3.375 g Intravenous Q8H Jaeanna Mccomber E, MD      . polyethylene glycol (MIRALAX / GLYCOLAX) packet 17 g  17 g Oral Daily PRN Sherren Kerns, MD      . potassium chloride SA (K-DUR,KLOR-CON) CR tablet 20-40 mEq  20-40 mEq Oral Once Sherren Kerns, MD      . pravastatin (PRAVACHOL) tablet 40 mg  40 mg Oral Daily Ndia Sampath, Janetta Hora, MD      . sertraline (ZOLOFT) tablet 100 mg  100 mg Oral Daily Vilda Zollner E, MD      . sodium chloride flush (NS) 0.9 % injection 3 mL  3 mL Intravenous Q12H Janey Petron E, MD      . sodium chloride flush (NS) 0.9 % injection 3 mL  3 mL Intravenous PRN Sherren Kerns, MD      . traZODone (DESYREL) tablet 50 mg  50 mg Oral QHS Sherren Kerns, MD      . Zinc TABS 50 mg  50 mg Oral Daily Sherren Kerns, MD       Current Outpatient Medications  Medication Sig Dispense Refill  . amitriptyline (ELAVIL) 25 MG tablet Take 1 tablet (25 mg total) by mouth at bedtime. 30  tablet 0  . aspirin 81 MG EC tablet Take 1 tablet (81 mg total) by mouth daily. 30 tablet 0  . cephALEXin (KEFLEX) 500 MG capsule Take 1 capsule (500 mg total) by mouth 3 (three) times daily. 30 capsule 0  . clopidogrel (PLAVIX) 75 MG tablet Take 1 tablet (75 mg total) by mouth daily with breakfast. (Patient not taking: Reported on 12/23/2018) 30 tablet 0  . collagenase (SANTYL) ointment Apply 1 application topically daily. 30 g 1  . gabapentin (NEURONTIN) 300 MG capsule     . Insulin Isophane & Regular Human (HUMULIN 70/30 KWIKPEN) (70-30) 100 UNIT/ML PEN Inject 70 Units into the skin 2 (two) times daily. Inject 70 units with breakfast and 60 units with dinner. Hold for CBG less than 60. Notify MD if CBG greater than 300 (DM) 15 mL 0  . LORazepam (ATIVAN) 0.5 MG tablet Take 1 tablet (0.5 mg  total) by mouth 2 (two) times daily as needed for anxiety. 15 tablet 0  . losartan (COZAAR) 100 MG tablet Take 1 tablet (100 mg total) by mouth daily. 30 tablet 0  . metFORMIN (GLUCOPHAGE-XR) 500 MG 24 hr tablet     . Multiple Vitamin (MULTIVITAMIN WITH MINERALS) TABS tablet Place 1 tablet into feeding tube daily. 30 tablet 0  . nicotine polacrilex (NICORELIEF) 2 MG gum Take 1 each (2 mg total) by mouth daily as needed for smoking cessation. 30 tablet 0  . oxyCODONE-acetaminophen (PERCOCET/ROXICET) 5-325 MG tablet Take 1 tablet by mouth every 6 (six) hours as needed for severe pain. 30 tablet 0  . polyethylene glycol (MIRALAX / GLYCOLAX) packet Take 17 g by mouth daily as needed for moderate constipation. 14 each 0  . pravastatin (PRAVACHOL) 20 MG tablet Take 2 tablets (40 mg total) by mouth daily. 30 tablet 0  . senna-docusate (SENOKOT-S) 8.6-50 MG tablet Take 1 tablet by mouth 2 (two) times daily. 60 tablet 0  . sertraline (ZOLOFT) 100 MG tablet Take 1 tablet (100 mg total) by mouth daily. 30 tablet 0  . traMADol (ULTRAM) 50 MG tablet Take 1 tablet (50 mg total) by mouth every 6 (six) hours as needed. 30 tablet 0  . traZODone (DESYREL) 50 MG tablet Take 1 tablet (50 mg total) by mouth at bedtime. 30 tablet 0  . TRUE METRIX BLOOD GLUCOSE TEST test strip USE TO CHECK YOUR BLOOD SUGAR TWICE A DAY (DX  E11.21) IN VITRO 90 DAYS    . Vitamin E 180 MG CAPS Take 180 mg by mouth daily. 30 capsule 0  . Zinc 50 MG TABS Take 1 tablet (50 mg total) by mouth daily. 30 tablet 0    ROS:   General:  No weight loss, Fever, chills  HEENT: No recent headaches, no nasal bleeding, no visual changes, no sore throat  Neurologic: No dizziness, blackouts, seizures. No recent symptoms of stroke or mini- stroke. No recent episodes of slurred speech, or temporary blindness.  Cardiac: No recent episodes of chest pain/pressure, no shortness of breath at rest.  +shortness of breath with exertion.  Denies history of  atrial fibrillation or irregular heartbeat  Vascular: No history of rest pain in feet.  No history of claudication.  +history of non-healing ulcer, No history of DVT   Pulmonary: No home oxygen, no productive cough, no hemoptysis,  No asthma or wheezing  Musculoskeletal:   Arthritis,  Low back pain,   Joint pain  Hematologic:No history of hypercoagulable state.  No history of easy  bleeding.  No history of anemia  Gastrointestinal: No hematochezia or melena,  No gastroesophageal reflux, no trouble swallowing  Urinary:  chronic Kidney disease,  on HD -  MWF or  TTHS,  Burning with urination,  Frequent urination,  Difficulty urinating;   Skin: No rashes  Psychological: No history of anxiety,  No history of depression   Physical Examination  Vitals:   01/17/19 1208 01/17/19 1214 01/17/19 1215 01/17/19 1413  BP: (!) 149/71  138/73   Pulse: 85  85   Resp: (!) 23  20   Temp: 97.9 F (36.6 C)     TempSrc: Oral     SpO2: 97%  97%   Weight:  87.1 kg  87 kg  Height:     (1.575 Buchanan)    Body mass index is 35.08 kg/Buchanan.  General:  Alert and oriented, no acute distress HEENT: Normal Neck: No JVD Pulmonary: Clear to auscultation bilaterally Cardiac: Regular Rate and Rhythm Abdomen: Soft, non-tender, non-distended, obese Skin: No rash, open TMA with necrosis and gangrene extending on the plantar aspect almost to the heel, foul smell, minimal granulation Extremity Pulses:  2+ radial, brachial, femoral,absent dorsalis pedis, posterior tibial pulse Musculoskeletal: well healed left AKA  Neurologic: Upper and lower extremity motor 5/5 and symmetric  DATA:  CBC    Component Value Date/Time   WBC 10.0 01/17/2019 1240   RBC 4.62 01/17/2019 1240   HGB 12.6 01/17/2019 1240   HCT 40.0 01/17/2019 1240   PLT 235 01/17/2019 1240   MCV 86.6 01/17/2019 1240   MCH 27.3 01/17/2019 1240   MCHC 31.5 01/17/2019 1240   RDW 13.7 01/17/2019 1240   LYMPHSABS 1.2  01/17/2019 1240   MONOABS 0.5 01/17/2019 1240   EOSABS 0.2 01/17/2019 1240   BASOSABS 0.1 01/17/2019 1240    BMET    Component Value Date/Time   NA 131 (L) 01/17/2019 1240   K 3.9 01/17/2019 1240   CL 95 (L) 01/17/2019 1240   CO2 22 01/17/2019 1240   GLUCOSE 304 (H) 01/17/2019 1240   BUN 13 01/17/2019 1240   CREATININE 0.77 01/17/2019 1240   CALCIUM 9.1 01/17/2019 1240   GFRNONAA >60 01/17/2019 1240   GFRAA >60 01/17/2019 1240     ASSESSMENT:  Gangrene right foot with failed TMA   PLAN: Admit for IV antibiotics.  Discussed situation with pt and her power of attorney.  Listed options of BKA vs AKA.  I doubt she will ever be strong enough to use a prosthesis but her nephew thinks she may have more advantage with transfer on a BKA.  Discussed risk of non healing 10-15%.  They want a BKA for now Will schedule for Monday 01/19/19   [name], MD [facility] [place] Office: [phone] Pager: [phone]

## 2019-01-17 NOTE — ED Triage Notes (Signed)
Pt in from home with c/o non-healing R foot wound. States she had all 5 toes amputated 2 mo's ago by Dr. Luanna Salk. Yesterday, [facility] RN stated she noticed malodorous scent at site. Denies any new drainage or fevers.

## 2019-01-17 NOTE — ED Provider Notes (Signed)
[facility] EMERGENCY DEPARTMENT Provider Note   CSN: [id number] Arrival date & time: 01/17/19  1158    History   Chief Complaint Chief Complaint  Patient presents with   Post-op Problem   R foot infection    HPI Danielle Buchanan is a 63 y.o. female.     HPI  63 year old female presents with concern for stump infection.  She has been having her wound changed by home health nurse every day since she had her toes and distal foot removed 2 months ago.  Last night when the wound was being changed they noticed a foul smell.  This is the first time they have noticed this.  She had on and off tiny bits of blood but noticed purulent drainage.  The bleeding is not new.  No increase in drainage.  She denies any pain to her foot.  No fevers, cough, shortness of breath. Was encouraged to come to ER today for evaluation.  Past Medical History:  Diagnosis Date   Allergic rhinitis    Depression    Diabetes (HCC)    Dyslipidemia    HTN (hypertension)    Insomnia    OSA (obstructive sleep apnea)    no cpap   Urinary incontinence     Patient Active Problem List   Diagnosis Date Noted   Gangrene of right foot (HCC) 01/17/2019   History of transmetatarsal amputation of right foot (HCC) 12/13/2018   At risk for adverse drug event 11/27/2018   Osteomyelitis of third toe of right foot (HCC) 11/12/2018   Pain in finger of right hand 02/20/2018   Atherosclerosis of native arteries of the extremities with gangrene (HCC) 11/01/2017   Unilateral AKA, left (HCC)    Post-operative pain    OSA (obstructive sleep apnea)    Benign essential HTN    DM (diabetes mellitus), secondary, uncontrolled, with peripheral vascular complications (HCC)    Hyperkalemia    Leukocytosis    Acute blood loss anemia    Amputation stump infection (HCC) 08/23/2017   Amputation of left lower extremity above knee upon examination (HCC) 08/23/2017   PAD (peripheral artery  disease) (HCC) 07/23/2017   Cellulitis 06/10/2017   Hyponatremia 06/10/2017   Fever 06/10/2017   Rash 05/24/2017   HTN (hypertension)    Allergic rhinitis    Insomnia    Depression     Past Surgical History:  Procedure Laterality Date   ABDOMINAL AORTOGRAM W/LOWER EXTREMITY N/A 06/24/2017   Procedure: ABDOMINAL AORTOGRAM W/LOWER EXTREMITY;  Surgeon: Maeola Harman, MD;  Location: [facility] INVASIVE CV LAB;  Service: Cardiovascular;  Laterality: N/A;   ABDOMINAL AORTOGRAM W/LOWER EXTREMITY Right 10/09/2017   Procedure: ABDOMINAL AORTOGRAM W/LOWER EXTREMITY;  Surgeon: Maeola Harman, MD;  Location[facility] INVASIVE CV LAB;  Service: Cardiovascular;  Laterality: Right;   AMPUTATION Left 07/23/2017   Procedure: AMPUTATION  BELOW KNEE;  Surgeon: Maeola Harman, MD;  Location: [facility] OR;  Service: Vascular;  Laterality: Left;   AMPUTATION Left 08/12/2017   Procedure: REVISION BELOW KNEE;  Surgeon: Maeola Harman, MD;  Location: [facility] OR;  Service: Vascular;  Laterality: Left;   AMPUTATION Left 08/27/2017   Procedure: left ABOVE KNEE AMPUTATION;  Surgeon: Maeola Harman, MD;  Location: [facility] OR;  Service: Vascular;  Laterality: Left;   AMPUTATION Left 08/30/2017   Procedure: REVISION AMPUTATION ABOVE KNEE;  Surgeon: Nada Libman, MD;  Location: [facility] OR;  Service: Vascular;  Laterality: Left;   AMPUTATION Right 11/04/2017  Procedure: AMPUTATION RIGHT FOURTH TOE;  Surgeon: Maeola Harmanain, Brandon Christopher, MD;  [facility];  Service: Vascular;  Laterality: Right;   AMPUTATION Right 11/12/2018   Procedure: AMPUTATION OF second, third and fifth toes;  Surgeon: Nada LibmanBrabham, Vance W, MD;  [facility];  Service: Vascular;  Laterality: Right;   APPLICATION OF WOUND VAC Left 08/27/2017   Procedure: APPLICATION OF WOUND VAC;  Surgeon: Maeola Harmanain, Brandon Christopher, MD;  [facility];  Service: Vascular;  Laterality: Left;   CARPAL TUNNEL RELEASE Right     COLONOSCOPY     LOWER EXTREMITY ANGIOGRAM Right 11/12/2018   Procedure: Rosalin HawkingANGIOGRAM OF Right LEG;  Surgeon: Nada LibmanBrabham, Vance W, MD;  [facility];  Service: Vascular;  Laterality: Right;   LOWER EXTREMITY INTERVENTION Right 10/09/2017   Procedure: LOWER EXTREMITY INTERVENTION;  Surgeon: Maeola Harmanain, Brandon Christopher, MD;  [facility];  Service: Cardiovascular;  Laterality: Right;   TOE AMPUTATION Right 11/12/2018   2ND 3RD & 5TH TOE    TRANSMETATARSAL AMPUTATION Right 11/19/2018   Procedure: TRANSMETATARSAL AMPUTATION REVISION;  Surgeon: Nada LibmanBrabham, Vance W, MD;  Location: [facility] OR;  Service: Vascular;  Laterality: Right;     OB History   No obstetric history on file.      Home Medications    Prior to Admission medications   Medication Sig Start Date End Date Taking? Authorizing Provider  amitriptyline (ELAVIL) 25 MG tablet Take 1 tablet (25 mg total) by mouth at bedtime. 12/15/18   Roena MaladyLassen, Arlo C, PA-C  aspirin 81 MG EC tablet Take 1 tablet (81 mg total) by mouth daily. 12/15/18   Edmon CrapeLassen, Arlo C, PA-C  cephALEXin (KEFLEX) 500 MG capsule Take 1 capsule (500 mg total) by mouth 3 (three) times daily. 12/23/18   Nickel, Carma LairSuzanne L, NP  clopidogrel (PLAVIX) 75 MG tablet Take 1 tablet (75 mg total) by mouth daily with breakfast. Patient not taking: Reported on 12/23/2018 12/15/18   Roena MaladyLassen, Arlo C, PA-C  collagenase (SANTYL) ointment Apply 1 application topically daily. 12/22/18   Nickel, Carma LairSuzanne L, NP  gabapentin (NEURONTIN) 300 MG capsule  12/31/18   [provider]  Insulin Isophane & Regular Human (HUMULIN 70/30 KWIKPEN) (70-30) 100 UNIT/ML PEN Inject 70 Units into the skin 2 (two) times daily. Inject 70 units with breakfast and 60 units with dinner. Hold for CBG less than 60. Notify MD if CBG greater than 300 (DM) 12/15/18   Edmon CrapeLassen, Arlo C, PA-C  LORazepam (ATIVAN) 0.5 MG tablet Take 1 tablet (0.5 mg total) by mouth 2 (two) times daily as needed for anxiety. 12/15/18   Edmon CrapeLassen, Arlo C, PA-C   losartan (COZAAR) 100 MG tablet Take 1 tablet (100 mg total) by mouth daily. 12/15/18   Roena MaladyLassen, Arlo C, PA-C  metFORMIN (GLUCOPHAGE-XR) 500 MG 24 hr tablet  12/30/18   [provider]  Multiple Vitamin (MULTIVITAMIN WITH MINERALS) TABS tablet Place 1 tablet into feeding tube daily. 11/23/18   Marguerita MerlesSheikh, Omair Latif, DO  nicotine polacrilex (NICORELIEF) 2 MG gum Take 1 each (2 mg total) by mouth daily as needed for smoking cessation. 12/15/18   Roena MaladyLassen, Arlo C, PA-C  oxyCODONE-acetaminophen (PERCOCET/ROXICET) 5-325 MG tablet Take 1 tablet by mouth every 6 (six) hours as needed for severe pain. 12/15/18   Edmon CrapeLassen, Arlo C, PA-C  polyethylene glycol (MIRALAX / GLYCOLAX) packet Take 17 g by mouth daily as needed for moderate constipation. 12/15/18   Edmon CrapeLassen, Arlo C, PA-C  pravastatin (PRAVACHOL) 20 MG tablet Take 2 tablets (40 mg  total) by mouth daily. 12/15/18   Trudie Reed, Arlo C, PA-C  senna-docusate (SENOKOT-S) 8.6-50 MG tablet Take 1 tablet by mouth 2 (two) times daily. 12/15/18   Edmon Crape C, PA-C  sertraline (ZOLOFT) 100 MG tablet Take 1 tablet (100 mg total) by mouth daily. 12/15/18   Edmon Crape C, PA-C  traMADol (ULTRAM) 50 MG tablet Take 1 tablet (50 mg total) by mouth every 6 (six) hours as needed. 01/05/19   Nada Libman, MD  traZODone (DESYREL) 50 MG tablet Take 1 tablet (50 mg total) by mouth at bedtime. 12/15/18   Edmon Crape C, PA-C  TRUE METRIX BLOOD GLUCOSE TEST test strip USE TO CHECK YOUR BLOOD SUGAR TWICE A DAY (DX  E11.21) IN VITRO 90 DAYS 12/04/18   [provider]  Vitamin E 180 MG CAPS Take 180 mg by mouth daily. 12/15/18   Edmon Crape C, PA-C  Zinc 50 MG TABS Take 1 tablet (50 mg total) by mouth daily. 12/15/18   Roena Malady, PA-C    Family History Family History  Problem Relation Age of Onset   Heart attack Mother    Alcohol abuse Mother    Heart attack Father    Alcohol abuse Father    Heart attack Sister    Heart attack Brother    Hypercholesterolemia Sister      Diabetes Neg Hx     Social History Social History   Tobacco Use   Smoking status: Former Smoker    Packs/day: 1.00    Years: 51.00    Pack years: 51.00    Types: Cigarettes    Last attempt to quit: 08/08/2018    Years since quitting: 0.4   Smokeless tobacco: Never Used  Substance Use Topics   Alcohol use: No    Alcohol/week: 0.0 standard drinks   Drug use: No     Allergies   Milk-related compounds and Doxycycline   Review of Systems Review of Systems  Constitutional: Negative for fever.  Respiratory: Negative for cough and shortness of breath.   Musculoskeletal: Negative for arthralgias.  Skin: Positive for wound.  All other systems reviewed and are negative.    Physical Exam Updated Vital Signs BP 126/60    Pulse 80    Temp 97.9 F (36.6 C) (Oral)    Resp (!) 22    Ht  (1.575 m)    Wt 87 kg    SpO2 93%    BMI 35.08 kg/m   Physical Exam Vitals signs and nursing note reviewed.  Constitutional:      Appearance: She is well-developed. She is obese.  HENT:     Head: Normocephalic and atraumatic.     Right Ear: External ear normal.     Left Ear: External ear normal.     Nose: Nose normal.  Eyes:     General:        Right eye: No discharge.        Left eye: No discharge.  Cardiovascular:     Rate and Rhythm: Normal rate and regular rhythm.     Pulses:          Dorsalis pedis pulses are detected w/ Doppler on the right side.  Pulmonary:     Effort: Pulmonary effort is normal.  Abdominal:     General: There is no distension.  Musculoskeletal:     Comments: See picture. Foot is mildly swollen. Mild dark redness surrounding edge of wound, no increased warmth. No drainage from wound  but there is a foul smell  Skin:    General: Skin is warm and dry.  Neurological:     Mental Status: She is alert.  Psychiatric:        Mood and Affect: Mood is not anxious.        ED Treatments / Results  Labs (all labs ordered are listed, but only abnormal  results are displayed) Labs Reviewed  BASIC METABOLIC PANEL - Abnormal; Notable for the following components:      Result Value   Sodium 131 (*)    Chloride 95 (*)    Glucose, Bld 304 (*)    All other components within normal limits  CBC WITH DIFFERENTIAL/PLATELET - Abnormal; Notable for the following components:   Neutro Abs 7.8 (*)    Abs Immature Granulocytes 0.25 (*)    All other components within normal limits  SEDIMENTATION RATE - Abnormal; Notable for the following components:   Sed Rate 63 (*)    All other components within normal limits  C-REACTIVE PROTEIN - Abnormal; Notable for the following components:   CRP 10.5 (*)    All other components within normal limits  CBC  CREATININE, SERUM    EKG None  Radiology Dg Foot Complete Right  Result Date: 01/17/2019 CLINICAL DATA:  Right foot stump infection. Hx of diabetes, hypertension, right foot transmetatarsal amputation. EXAM: RIGHT FOOT COMPLETE - 3+ VIEW COMPARISON:  Plain film of the RIGHT foot dated 11/11/2018. FINDINGS: Interval amputation at the first through fifth TMT joints. At least some fragmentation overlying the distal margin of the first cuneiform bone, of uncertain acuity and could be benign postsurgical change or sequela of osteomyelitis. Additional fragmentation adjacent to the lateral margin of the cuboid bone appears chronic. Probable soft tissue edema. IMPRESSION: 1. Interval amputation at the first through fifth TMT joints. At least some fragmentation overlying the distal margin of the first cuneiform bone, of uncertain acuity and could be benign postsurgical change or sequela of osteomyelitis. 2. Probable soft tissue edema over the midfoot. No soft tissue gas seen. Electronically Signed   By: Bary Richard M.D.   On: 01/17/2019 13:41    Procedures Procedures (including critical care time)  Medications Ordered in ED Medications  potassium chloride SA (K-DUR,KLOR-CON) CR tablet 20-40 mEq (has no  administration in time range)  ondansetron (ZOFRAN) injection 4 mg (has no administration in time range)  alum & mag hydroxide-simeth (MAALOX/MYLANTA) 200-200-20 MG/5ML suspension 15-30 mL (has no administration in time range)  pantoprazole (PROTONIX) EC tablet 40 mg (has no administration in time range)  labetalol (NORMODYNE,TRANDATE) injection 10 mg (has no administration in time range)  hydrALAZINE (APRESOLINE) injection 5 mg (has no administration in time range)  metoprolol tartrate (LOPRESSOR) injection 2-5 mg (has no administration in time range)  guaiFENesin-dextromethorphan (ROBITUSSIN DM) 100-10 MG/5ML syrup 15 mL (has no administration in time range)  phenol (CHLORASEPTIC) mouth spray 1 spray (has no administration in time range)  heparin injection 5,000 Units (has no administration in time range)  sodium chloride flush (NS) 0.9 % injection 3 mL (has no administration in time range)  sodium chloride flush (NS) 0.9 % injection 3 mL (has no administration in time range)  0.9 %  sodium chloride infusion (has no administration in time range)  piperacillin-tazobactam (ZOSYN) IVPB 3.375 g (has no administration in time range)  acetaminophen (TYLENOL) tablet 325-650 mg (has no administration in time range)    Or  acetaminophen (TYLENOL) suppository 325-650 mg (has no administration in  time range)  oxyCODONE (Oxy IR/ROXICODONE) immediate release tablet 5-10 mg (has no administration in time range)  morphine 2 MG/ML injection 2-5 mg (has no administration in time range)  docusate sodium (COLACE) capsule 100 mg (has no administration in time range)  amitriptyline (ELAVIL) tablet 25 mg (has no administration in time range)  aspirin EC tablet 81 mg (has no administration in time range)  collagenase (SANTYL) ointment 1 application (has no administration in time range)  gabapentin (NEURONTIN) capsule 300 mg (has no administration in time range)  Insulin Isophane & Regular Human (HUMULIN 70/30  MIX) (70-30) 100 UNIT/ML 70 Units (has no administration in time range)  LORazepam (ATIVAN) tablet 0.5 mg (has no administration in time range)  losartan (COZAAR) tablet 100 mg (has no administration in time range)  metFORMIN (GLUCOPHAGE-XR) 24 hr tablet 500 mg (has no administration in time range)  multivitamin with minerals tablet 1 tablet (has no administration in time range)  nicotine polacrilex (NICORETTE) gum 2 mg (has no administration in time range)  polyethylene glycol (MIRALAX / GLYCOLAX) packet 17 g (has no administration in time range)  pravastatin (PRAVACHOL) tablet 40 mg (has no administration in time range)  sertraline (ZOLOFT) tablet 100 mg (has no administration in time range)  traZODone (DESYREL) tablet 50 mg (has no administration in time range)  Zinc TABS 50 mg (has no administration in time range)  insulin aspart (novoLOG) injection 0-20 Units (has no administration in time range)     Initial Impression / Assessment and Plan / ED Course  I have reviewed the triage vital signs and the nursing notes.  Pertinent labs & imaging results that were available during my care of the patient were reviewed by me and considered in my medical decision making (see chart for details).        Patient is stable but her foot is concerning for infection.  Discussed with Dr. Darrick Penna after work-up and he will admit for antibiotics and operative treatment.  Final Clinical Impressions(s) / ED Diagnoses   Final diagnoses:  Gangrene of right foot [facility])    ED Discharge Orders    None       Pricilla Loveless, MD 01/17/19 1517

## 2019-01-17 NOTE — ED Notes (Signed)
ED TO INPATIENT HANDOFF REPORT  ED Nurse Name and Phone #: Caryl Comes RN [phone]  S Name/Age/Gender Danielle Buchanan 63 y.o. female Room/Bed: 017C/017C  Code Status   Code Status: Full Code  Home/SNF/Other Home Patient oriented to: self, person, place, time Is this baseline? Yes   Triage Complete: Triage complete  Chief Complaint post op problem/infection  Triage Note Pt in from home with c/o non-healing R foot wound. States she had all 5 toes amputated 2 mo's ago by Dr. Luanna Salk. Yesterday, [facility] RN stated she noticed malodorous scent at site. Denies any new drainage or fevers.    Allergies Allergies  Allergen Reactions  . Milk-Related Compounds Other (See Comments)    incontinence  . Doxycycline Rash    Level of Care/Admitting Diagnosis ED Disposition    ED Disposition Condition Comment   Admit  Hospital Area: [facility] [[id number]]  Level of Care: Telemetry Surgical [105]  Diagnosis: Gangrene of right foot [facility]) [[id number]]  Admitting Physician: Wynona Neat  Attending Physician: Sherren Kerns [phone]  Estimated length of stay: 5 - 7 days  Certification:: I certify this patient will need inpatient services for at least 2 midnights  Bed request comments: 4e  PT Class (Do Not Modify): Inpatient [101]  PT Acc Code (Do Not Modify): Private [1]       B Medical/Surgery History Past Medical History:  Diagnosis Date  . Allergic rhinitis   . Depression   . Diabetes (HCC)   . Dyslipidemia   . HTN (hypertension)   . Insomnia   . OSA (obstructive sleep apnea)    no cpap  . Urinary incontinence    Past Surgical History:  Procedure Laterality Date  . ABDOMINAL AORTOGRAM W/LOWER EXTREMITY N/A 06/24/2017   Procedure: ABDOMINAL AORTOGRAM W/LOWER EXTREMITY;  Surgeon: Maeola Harman, MD;  Location: [facility] INVASIVE CV LAB;  Service: Cardiovascular;  Laterality: N/A;  . ABDOMINAL AORTOGRAM W/LOWER EXTREMITY Right 10/09/2017   Procedure: ABDOMINAL AORTOGRAM W/LOWER EXTREMITY;  Surgeon: Maeola Harman, MD;  Location: [facility] INVASIVE CV LAB;  Service: Cardiovascular;  Laterality: Right;  . AMPUTATION Left 07/23/2017   Procedure: AMPUTATION  BELOW KNEE;  Surgeon: Maeola Harman, MD;  Location: [facility] OR;  Service: Vascular;  Laterality: Left;  . AMPUTATION Left 08/12/2017   Procedure: REVISION BELOW KNEE;  Surgeon: Maeola Harman, MD;  Location: [facility] OR;  Service: Vascular;  Laterality: Left;  . AMPUTATION Left 08/27/2017   Procedure: left ABOVE KNEE AMPUTATION;  Surgeon: Maeola Harman, MD;  Location: [facility] OR;  Service: Vascular;  Laterality: Left;  . AMPUTATION Left 08/30/2017   Procedure: REVISION AMPUTATION ABOVE KNEE;  Surgeon: Nada Libman, MD;  Location: [facility] OR;  Service: Vascular;  Laterality: Left;  . AMPUTATION Right 11/04/2017   Procedure: AMPUTATION RIGHT FOURTH TOE;  Surgeon: Maeola Harman, MD;  Location: [facility] OR;  Service: Vascular;  Laterality: Right;  . AMPUTATION Right 11/12/2018   Procedure: AMPUTATION OF second, third and fifth toes;  Surgeon: Nada Libman, MD;  Location: [facility] OR;  Service: Vascular;  Laterality: Right;  . APPLICATION OF WOUND VAC Left 08/27/2017   Procedure: APPLICATION OF WOUND VAC;  Surgeon: Maeola Harman, MD;  Location: [facility] OR;  Service: Vascular;  Laterality: Left;  . CARPAL TUNNEL RELEASE Right   . COLONOSCOPY    . LOWER EXTREMITY ANGIOGRAM Right 11/12/2018   Procedure: Rosalin Hawking OF Right LEG;  Surgeon: Nada Libman, MD;  Location: [facility] OR;  Service: Vascular;  Laterality: Right;  . LOWER EXTREMITY INTERVENTION Right 10/09/2017   Procedure: LOWER EXTREMITY INTERVENTION;  Surgeon: Maeola Harman, MD;  Location: [facility] INVASIVE CV LAB;  Service: Cardiovascular;  Laterality: Right;  . TOE AMPUTATION Right 11/12/2018   2ND 3RD & 5TH TOE   . TRANSMETATARSAL AMPUTATION Right 11/19/2018   Procedure: TRANSMETATARSAL AMPUTATION  REVISION;  Surgeon: Nada Libman, MD;  Location: [facility] OR;  Service: Vascular;  Laterality: Right;     A IV Location/Drains/Wounds Patient Lines/Drains/Airways Status   Active Line/Drains/Airways    Name:   Placement date:   Placement time:   Site:   Days:   Peripheral IV 01/17/19 Right;Posterior Forearm   01/17/19    1256    Forearm   less than 1   External Urinary Catheter   11/14/18    0441    -   64   External Urinary Catheter   11/23/18    0330    -   55   Incision (Closed) 11/12/18 Foot Right   11/12/18    1551     66   Incision (Closed) 11/12/18 Groin Right   11/12/18    1551     66   Incision (Closed) 11/12/18 Groin Left   11/12/18    1551     66   Incision (Closed) 11/19/18 Foot Right   11/19/18    0922     59          Intake/Output Last 24 hours No intake or output data in the 24 hours ending 01/17/19 1456  Labs/Imaging Results for orders placed or performed during the hospital encounter of 01/17/19 (from the past 48 hour(s))  Basic metabolic panel     Status: Abnormal   Collection Time: 01/17/19 12:40 PM  Result Value Ref Range   Sodium 131 (L) 135 - 145 mmol/L   Potassium 3.9 3.5 - 5.1 mmol/L   Chloride 95 (L) 98 - 111 mmol/L   CO2 22 22 - 32 mmol/L   Glucose, Bld 304 (H) 70 - 99 mg/dL   BUN 13 8 - 23 mg/dL   Creatinine, Ser 1.61 0.44 - 1.00 mg/dL   Calcium 9.1 8.9 - 09.6 mg/dL   GFR calc non Af Amer >60 >60 mL/min   GFR calc Af Amer >60 >60 mL/min   Anion gap 14 5 - 15    Comment: Performed at [facility], [address], [place], [place] [address]  CBC with Differential     Status: Abnormal   Collection Time: 01/17/19 12:40 PM  Result Value Ref Range   WBC 10.0 4.0 - 10.5 K/uL   RBC 4.62 3.87 - 5.11 MIL/uL   Hemoglobin 12.6 12.0 - 15.0 g/dL   HCT 98.1 19.1 - 47.8 %   MCV 86.6 80.0 - 100.0 fL   MCH 27.3 26.0 - 34.0 pg   MCHC 31.5 30.0 - 36.0 g/dL   RDW 29.5 62.1 - 30.8 %   Platelets 235 150 - 400 K/uL   nRBC 0.0 0.0 - 0.2 %   Neutrophils  Relative % 77 %   Neutro Abs 7.8 (H) 1.7 - 7.7 K/uL   Lymphocytes Relative 12 %   Lymphs Abs 1.2 0.7 - 4.0 K/uL   Monocytes Relative 5 %   Monocytes Absolute 0.5 0.1 - 1.0 K/uL   Eosinophils Relative 2 %   Eosinophils Absolute 0.2 0.0 - 0.5 K/uL   Basophils Relative 1 %  Basophils Absolute 0.1 0.0 - 0.1 K/uL   Immature Granulocytes 3 %   Abs Immature Granulocytes 0.25 (H) 0.00 - 0.07 K/uL    Comment: Performed at [facility], [address] [place], [place] [address]  Sedimentation rate     Status: Abnormal   Collection Time: 01/17/19 12:40 PM  Result Value Ref Range   Sed Rate 63 (H) 0 - 22 mm/hr    Comment: Performed at [facility], [address], [place], [place] [address]  C-reactive protein     Status: Abnormal   Collection Time: 01/17/19 12:40 PM  Result Value Ref Range   CRP 10.5 (H) <1.0 mg/dL    Comment: Performed at [facility], [address], [place], [place] [address]   Dg Foot Complete Right  Result Date: 01/17/2019 CLINICAL DATA:  Right foot stump infection. Hx of diabetes, hypertension, right foot transmetatarsal amputation. EXAM: RIGHT FOOT COMPLETE - 3+ VIEW COMPARISON:  Plain film of the RIGHT foot dated 11/11/2018. FINDINGS: Interval amputation at the first through fifth TMT joints. At least some fragmentation overlying the distal margin of the first cuneiform bone, of uncertain acuity and could be benign postsurgical change or sequela of osteomyelitis. Additional fragmentation adjacent to the lateral margin of the cuboid bone appears chronic. Probable soft tissue edema. IMPRESSION: 1. Interval amputation at the first through fifth TMT joints. At least some fragmentation overlying the distal margin of the first cuneiform bone, of uncertain acuity and could be benign postsurgical change or sequela of osteomyelitis. 2. Probable soft tissue edema over the midfoot. No soft tissue gas seen. Electronically Signed   By: Bary Richard M.D.   On:  01/17/2019 13:41    Pending Labs Unresulted Labs (From admission, onward)    Start     Ordered   01/18/19 0500  CBC  Tomorrow morning,   R     01/17/19 1437   01/18/19 0500  Comprehensive metabolic panel  Tomorrow morning,   R     01/17/19 1437   01/18/19 0500  Hemoglobin A1c  Tomorrow morning,   R    Comments:  To assess prior glycemic control    01/17/19 1440   01/17/19 1435  CBC  (heparin)  Once,   R    Comments:  Baseline for heparin therapy IF NOT ALREADY DRAWN.  Notify MD if PLT < 100 K.    01/17/19 1437   01/17/19 1435  Creatinine, serum  (heparin)  Once,   R    Comments:  Baseline for heparin therapy IF NOT ALREADY DRAWN.    01/17/19 1437          Vitals/Pain Today's Vitals   01/17/19 1230 01/17/19 1245 01/17/19 1413 01/17/19 1413  BP: 125/70 126/60    Pulse: 81 80    Resp: (!) 22 (!) 22    Temp:      TempSrc:      SpO2: 97% 93%    Weight:    87 kg  Height:     (1.575 m)  PainSc:   0-No pain     Isolation Precautions No active isolations  Medications Medications  potassium chloride SA (K-DUR,KLOR-CON) CR tablet 20-40 mEq (has no administration in time range)  ondansetron (ZOFRAN) injection 4 mg (has no administration in time range)  alum & mag hydroxide-simeth (MAALOX/MYLANTA) 200-200-20 MG/5ML suspension 15-30 mL (has no administration in time range)  pantoprazole (PROTONIX) EC tablet 40 mg (has no administration in time  range)  labetalol (NORMODYNE,TRANDATE) injection 10 mg (has no administration in time range)  hydrALAZINE (APRESOLINE) injection 5 mg (has no administration in time range)  metoprolol tartrate (LOPRESSOR) injection 2-5 mg (has no administration in time range)  guaiFENesin-dextromethorphan (ROBITUSSIN DM) 100-10 MG/5ML syrup 15 mL (has no administration in time range)  phenol (CHLORASEPTIC) mouth spray 1 spray (has no administration in time range)  heparin injection 5,000 Units (has no administration in time range)  sodium chloride  flush (NS) 0.9 % injection 3 mL (has no administration in time range)  sodium chloride flush (NS) 0.9 % injection 3 mL (has no administration in time range)  0.9 %  sodium chloride infusion (has no administration in time range)  piperacillin-tazobactam (ZOSYN) IVPB 3.375 g (has no administration in time range)  acetaminophen (TYLENOL) tablet 325-650 mg (has no administration in time range)    Or  acetaminophen (TYLENOL) suppository 325-650 mg (has no administration in time range)  oxyCODONE (Oxy IR/ROXICODONE) immediate release tablet 5-10 mg (has no administration in time range)  morphine 2 MG/ML injection 2-5 mg (has no administration in time range)  docusate sodium (COLACE) capsule 100 mg (has no administration in time range)  amitriptyline (ELAVIL) tablet 25 mg (has no administration in time range)  aspirin EC tablet 81 mg (has no administration in time range)  collagenase (SANTYL) ointment 1 application (has no administration in time range)  gabapentin (NEURONTIN) capsule 300 mg (has no administration in time range)  Insulin Isophane & Regular Human (HUMULIN 70/30 MIX) (70-30) 100 UNIT/ML 70 Units (has no administration in time range)  LORazepam (ATIVAN) tablet 0.5 mg (has no administration in time range)  losartan (COZAAR) tablet 100 mg (has no administration in time range)  metFORMIN (GLUCOPHAGE-XR) 24 hr tablet 500 mg (has no administration in time range)  multivitamin with minerals tablet 1 tablet (has no administration in time range)  nicotine polacrilex (NICORETTE) gum 2 mg (has no administration in time range)  polyethylene glycol (MIRALAX / GLYCOLAX) packet 17 g (has no administration in time range)  pravastatin (PRAVACHOL) tablet 40 mg (has no administration in time range)  sertraline (ZOLOFT) tablet 100 mg (has no administration in time range)  traZODone (DESYREL) tablet 50 mg (has no administration in time range)  Zinc TABS 50 mg (has no administration in time range)  insulin  aspart (novoLOG) injection 0-20 Units (has no administration in time range)    Mobility walks with device Moderate fall risk   Focused Assessments Musculoskeletal/vascular   R Recommendations: See Admitting Provider Note  Report given to:   Additional Notes:  Pt had 5 toes on right foot amputated 2 months ago.  HHRN reported increased drainage with redness, warmth, and serrosang drainage noted. Pt afebrile, uses wheelchair and walker at home, no complaints. A/O x 4. VSS. 20 g RPFA IV locked off.

## 2019-01-17 NOTE — ED Notes (Signed)
1st attempt to call report to [id number], RN on floor will call this RN directly.

## 2019-01-18 LAB — COMPREHENSIVE METABOLIC PANEL
ALT: 14 U/L (ref 0–44)
AST: 11 U/L — ABNORMAL LOW (ref 15–41)
Albumin: 3.1 g/dL — ABNORMAL LOW (ref 3.5–5.0)
Alkaline Phosphatase: 84 U/L (ref 38–126)
Anion gap: 11 (ref 5–15)
BUN: 11 mg/dL (ref 8–23)
CO2: 28 mmol/L (ref 22–32)
Calcium: 9.3 mg/dL (ref 8.9–10.3)
Chloride: 95 mmol/L — ABNORMAL LOW (ref 98–111)
Creatinine, Ser: 0.86 mg/dL (ref 0.44–1.00)
GFR calc Af Amer: >60 mL/min (ref >60)
GFR calc non Af Amer: >60 mL/min (ref >60)
Glucose, Bld: 228 mg/dL — ABNORMAL HIGH (ref 70–99)
Potassium: 4.1 mmol/L (ref 3.5–5.1)
Sodium: 134 mmol/L — ABNORMAL LOW (ref 135–145)
Total Bilirubin: 0.7 mg/dL (ref 0.3–1.2)
Total Protein: 6.4 g/dL — ABNORMAL LOW (ref 6.5–8.1)

## 2019-01-18 LAB — GLUCOSE, CAPILLARY
Glucose-Capillary: 203 mg/dL — ABNORMAL HIGH (ref 70–99)
Glucose-Capillary: 168 mg/dL — ABNORMAL HIGH (ref 70–99)
Glucose-Capillary: 215 mg/dL — ABNORMAL HIGH (ref 70–99)
Glucose-Capillary: 316 mg/dL — ABNORMAL HIGH (ref 70–99)
Glucose-Capillary: 292 mg/dL — ABNORMAL HIGH (ref 70–99)

## 2019-01-18 LAB — CBC
HCT: 39.6 % (ref 36.0–46.0)
Hemoglobin: 12.3 g/dL (ref 12.0–15.0)
MCH: 26.6 pg (ref 26.0–34.0)
MCHC: 31.1 g/dL (ref 30.0–36.0)
MCV: 85.7 fL (ref 80.0–100.0)
Platelets: 228 10*3/uL (ref 150–400)
RBC: 4.62 MIL/uL (ref 3.87–5.11)
RDW: 13.7 % (ref 11.5–15.5)
WBC: 8.3 10*3/uL (ref 4.0–10.5)
nRBC: 0 % (ref 0.0–0.2)

## 2019-01-18 MED ORDER — CEFAZOLIN SODIUM-DEXTROSE 2-4 GM/100ML-% IV SOLN
2.0000 g | INTRAVENOUS | Status: AC
  Administered 2019-01-19: 11:00:00 2 g via INTRAVENOUS
  Filled 2019-01-18 (×2): qty 100

## 2019-01-18 NOTE — Progress Notes (Signed)
Spoke with pt nephew by phone clarified issues regarding gangrenous changes in foot  He is now willing to proceed with BKA tomorrow as planned  [name], MD [facility] [place] Office: [phone] Pager: [phone]

## 2019-01-18 NOTE — H&P (View-Only) (Signed)
  Progress Note    01/18/2019 7:53 AM  Subjective:  No new complaints this morning   Vitals:   01/17/19 1944 01/18/19 0500  BP: (!) 149/66 (!) 147/71  Pulse: 87 73  Resp: (!) 24 14  Temp: 99.2 F (37.3 C) 97.8 F (36.6 C)  SpO2: 96% 97%   Physical Exam Lungs:  Non labored Extremities:  Extensive tissue loss RLE Neurologic: A&O  CBC    Component Value Date/Time   WBC 8.3 01/18/2019 0333   RBC 4.62 01/18/2019 0333   HGB 12.3 01/18/2019 0333   HCT 39.6 01/18/2019 0333   PLT 228 01/18/2019 0333   MCV 85.7 01/18/2019 0333   MCH 26.6 01/18/2019 0333   MCHC 31.1 01/18/2019 0333   RDW 13.7 01/18/2019 0333   LYMPHSABS 1.2 01/17/2019 1240   MONOABS 0.5 01/17/2019 1240   EOSABS 0.2 01/17/2019 1240   BASOSABS 0.1 01/17/2019 1240    BMET    Component Value Date/Time   NA 134 (L) 01/18/2019 0333   K 4.1 01/18/2019 0333   CL 95 (L) 01/18/2019 0333   CO2 28 01/18/2019 0333   GLUCOSE 228 (H) 01/18/2019 0333   BUN 11 01/18/2019 0333   CREATININE 0.86 01/18/2019 0333   CALCIUM 9.3 01/18/2019 0333   GFRNONAA >60 01/18/2019 0333   GFRAA >60 01/18/2019 0333    INR    Component Value Date/Time   INR 1.08 11/19/2018 0444     Intake/Output Summary (Last 24 hours) at 01/18/2019 0753 Last data filed at 01/18/2019 0500 Gross per 24 hour  Intake 480 ml  Output 1175 ml  Net -695 ml     Assessment/Plan:  63 y.o. female with extensive wound RLE  Plan is for R BKA tomorrow by Dr. Darrick Penna Please call patient's nephew to obtain consent NPO past midnight   Emilie Rutter, PA-C [facility] [phone] 01/18/2019 7:53 AM   Glucose improved this morning BKA tomorrow  [name], MD [facility] [place] Office: [phone] Pager: [phone]

## 2019-01-18 NOTE — Progress Notes (Addendum)
  Progress Note    01/18/2019 7:53 AM  Subjective:  No new complaints this morning   Vitals:   01/17/19 1944 01/18/19 0500  BP: (!) 149/66 (!) 147/71  Pulse: 87 73  Resp: (!) 24 14  Temp: 99.2 F (37.3 C) 97.8 F (36.6 C)  SpO2: 96% 97%   Physical Exam Lungs:  Non labored Extremities:  Extensive tissue loss RLE Neurologic: A&O  CBC    Component Value Date/Time   WBC 8.3 01/18/2019 0333   RBC 4.62 01/18/2019 0333   HGB 12.3 01/18/2019 0333   HCT 39.6 01/18/2019 0333   PLT 228 01/18/2019 0333   MCV 85.7 01/18/2019 0333   MCH 26.6 01/18/2019 0333   MCHC 31.1 01/18/2019 0333   RDW 13.7 01/18/2019 0333   LYMPHSABS 1.2 01/17/2019 1240   MONOABS 0.5 01/17/2019 1240   EOSABS 0.2 01/17/2019 1240   BASOSABS 0.1 01/17/2019 1240    BMET    Component Value Date/Time   NA 134 (L) 01/18/2019 0333   K 4.1 01/18/2019 0333   CL 95 (L) 01/18/2019 0333   CO2 28 01/18/2019 0333   GLUCOSE 228 (H) 01/18/2019 0333   BUN 11 01/18/2019 0333   CREATININE 0.86 01/18/2019 0333   CALCIUM 9.3 01/18/2019 0333   GFRNONAA >60 01/18/2019 0333   GFRAA >60 01/18/2019 0333    INR    Component Value Date/Time   INR 1.08 11/19/2018 0444     Intake/Output Summary (Last 24 hours) at 01/18/2019 0753 Last data filed at 01/18/2019 0500 Gross per 24 hour  Intake 480 ml  Output 1175 ml  Net -695 ml     Assessment/Plan:  63 y.o. female with extensive wound RLE  Plan is for R BKA tomorrow by Dr. Ruthetta Koopmann Please call patient's nephew to obtain consent NPO past midnight   Matthew Eveland, PA-C [facility] [phone] 01/18/2019 7:53 AM   Glucose improved this morning BKA tomorrow  Wai Litt, MD [facility] [place] Office: [phone] Pager: [phone]  

## 2019-01-18 NOTE — Plan of Care (Signed)
Care plans reviewed and patient is progressing.  

## 2019-01-19 ENCOUNTER — Inpatient Hospital Stay (HOSPITAL_COMMUNITY): Payer: Medicare HMO | Admitting: Certified Registered Nurse Anesthetist

## 2019-01-19 ENCOUNTER — Encounter (HOSPITAL_COMMUNITY)
Admission: EM | Disposition: A | Discharge status: Rehabilitation Facility | Payer: Self-pay | Source: Home / Self Care | Attending: Vascular Surgery

## 2019-01-19 ENCOUNTER — Encounter (HOSPITAL_COMMUNITY): Payer: Self-pay

## 2019-01-19 ENCOUNTER — Other Ambulatory Visit: Payer: Self-pay

## 2019-01-19 DIAGNOSIS — I96 Gangrene, not elsewhere classified: Secondary | ICD-10-CM

## 2019-01-19 HISTORY — PX: AMPUTATION: SHX166

## 2019-01-19 LAB — GLUCOSE, CAPILLARY
Glucose-Capillary: 277 mg/dL — ABNORMAL HIGH (ref 70–99)
Glucose-Capillary: 191 mg/dL — ABNORMAL HIGH (ref 70–99)
Glucose-Capillary: 237 mg/dL — ABNORMAL HIGH (ref 70–99)
Glucose-Capillary: 340 mg/dL — ABNORMAL HIGH (ref 70–99)
Glucose-Capillary: 409 mg/dL — ABNORMAL HIGH (ref 70–99)
Glucose-Capillary: 318 mg/dL — ABNORMAL HIGH (ref 70–99)

## 2019-01-19 LAB — HEMOGLOBIN A1C
Hgb A1c MFr Bld: 8.5 % — ABNORMAL HIGH (ref 4.8–5.6)
Mean Plasma Glucose: 197 mg/dL

## 2019-01-19 SURGERY — AMPUTATION BELOW KNEE
Anesthesia: Regional | Site: Leg Lower | Laterality: Right

## 2019-01-19 MED ORDER — POTASSIUM CHLORIDE CRYS ER 20 MEQ PO TBCR: 20.0000 meq | EXTENDED_RELEASE_TABLET | Freq: Every day | ORAL | Status: DC | PRN

## 2019-01-19 MED ORDER — PROPOFOL 10 MG/ML IV BOLUS
INTRAVENOUS | Status: DC | PRN
  Administered 2019-01-19: 140 mg via INTRAVENOUS

## 2019-01-19 MED ORDER — LIDOCAINE-EPINEPHRINE (PF) 1.5 %-1:200000 IJ SOLN
INTRAMUSCULAR | Status: DC | PRN
  Administered 2019-01-19 (×2): 10 mL via PERINEURAL

## 2019-01-19 MED ORDER — FENTANYL CITRATE (PF) 250 MCG/5ML IJ SOLN
INTRAMUSCULAR | Status: AC
  Filled 2019-01-19: qty 5

## 2019-01-19 MED ORDER — MIDAZOLAM HCL 2 MG/2ML IJ SOLN
INTRAMUSCULAR | Status: AC
  Filled 2019-01-19: qty 2

## 2019-01-19 MED ORDER — PHENYLEPHRINE 40 MCG/ML (10ML) SYRINGE FOR IV PUSH (FOR BLOOD PRESSURE SUPPORT)
PREFILLED_SYRINGE | INTRAVENOUS | Status: AC
  Filled 2019-01-19: qty 20

## 2019-01-19 MED ORDER — EPHEDRINE 5 MG/ML INJ
INTRAVENOUS | Status: AC
  Filled 2019-01-19: qty 30

## 2019-01-19 MED ORDER — SODIUM CHLORIDE 0.9% FLUSH
3.0000 mL | Freq: Two times a day (BID) | INTRAVENOUS | Status: DC
  Administered 2019-01-20 – 2019-01-23 (×3): 3 mL via INTRAVENOUS

## 2019-01-19 MED ORDER — LACTATED RINGERS IV SOLN
INTRAVENOUS | Status: DC
  Administered 2019-01-19: 10:00:00 via INTRAVENOUS

## 2019-01-19 MED ORDER — SODIUM CHLORIDE 0.9% FLUSH
3.0000 mL | INTRAVENOUS | Status: DC | PRN
  Administered 2019-01-23: 3 mL via INTRAVENOUS
  Filled 2019-01-19: qty 3

## 2019-01-19 MED ORDER — DEXAMETHASONE SODIUM PHOSPHATE 10 MG/ML IJ SOLN
INTRAMUSCULAR | Status: DC | PRN
  Administered 2019-01-19: 4 mg via INTRAVENOUS

## 2019-01-19 MED ORDER — ONDANSETRON HCL 4 MG/2ML IJ SOLN
INTRAMUSCULAR | Status: DC | PRN
  Administered 2019-01-19: 4 mg via INTRAVENOUS

## 2019-01-19 MED ORDER — MIDAZOLAM HCL 2 MG/2ML IJ SOLN
INTRAMUSCULAR | Status: DC | PRN
  Administered 2019-01-19: 2 mg via INTRAVENOUS

## 2019-01-19 MED ORDER — 0.9 % SODIUM CHLORIDE (POUR BTL) OPTIME
TOPICAL | Status: DC | PRN
  Administered 2019-01-19: 1000 mL

## 2019-01-19 MED ORDER — BUPIVACAINE HCL (PF) 0.5 % IJ SOLN
INTRAMUSCULAR | Status: DC | PRN
  Administered 2019-01-19: 15 mL
  Administered 2019-01-19: 20 mL

## 2019-01-19 MED ORDER — SODIUM CHLORIDE 0.9 % IV SOLN: 250.0000 mL | INTRAVENOUS | Status: DC | PRN

## 2019-01-19 MED ORDER — LACTATED RINGERS IV SOLN
INTRAVENOUS | Status: DC | PRN
  Administered 2019-01-19: 11:00:00 via INTRAVENOUS

## 2019-01-19 MED ORDER — FENTANYL CITRATE (PF) 100 MCG/2ML IJ SOLN
INTRAMUSCULAR | Status: DC | PRN
  Administered 2019-01-19 (×2): 50 ug via INTRAVENOUS

## 2019-01-19 MED ORDER — LIDOCAINE 2% (20 MG/ML) 5 ML SYRINGE
INTRAMUSCULAR | Status: DC | PRN
  Administered 2019-01-19: 60 mg via INTRAVENOUS

## 2019-01-19 MED ORDER — HEPARIN SODIUM (PORCINE) 1000 UNIT/ML IJ SOLN
INTRAMUSCULAR | Status: AC
  Filled 2019-01-19: qty 1

## 2019-01-19 MED ORDER — MAGNESIUM SULFATE 2 GM/50ML IV SOLN: 2.0000 g | Freq: Every day | INTRAVENOUS | Status: DC | PRN

## 2019-01-19 MED ORDER — PHENYLEPHRINE 40 MCG/ML (10ML) SYRINGE FOR IV PUSH (FOR BLOOD PRESSURE SUPPORT)
PREFILLED_SYRINGE | INTRAVENOUS | Status: DC | PRN
  Administered 2019-01-19 (×2): 80 ug via INTRAVENOUS
  Administered 2019-01-19: 160 ug via INTRAVENOUS
  Administered 2019-01-19: 120 ug via INTRAVENOUS
  Administered 2019-01-19: 160 ug via INTRAVENOUS

## 2019-01-19 SURGICAL SUPPLY — 48 items
BANDAGE ACE 6X5 VEL STRL LF (GAUZE/BANDAGES/DRESSINGS) ×1 IMPLANT
BANDAGE ELASTIC 4 VELCRO ST LF (GAUZE/BANDAGES/DRESSINGS) ×1 IMPLANT
BANDAGE ESMARK 6X9 LF (GAUZE/BANDAGES/DRESSINGS) IMPLANT
BLADE SAW RECIP 87.9 MT (BLADE) ×2 IMPLANT
BNDG COHESIVE 6X5 TAN STRL LF (GAUZE/BANDAGES/DRESSINGS) ×2 IMPLANT
BNDG ESMARK 6X9 LF (GAUZE/BANDAGES/DRESSINGS)
BNDG GAUZE ELAST 4 BULKY (GAUZE/BANDAGES/DRESSINGS) ×3 IMPLANT
CANISTER SUCT 3000ML PPV (MISCELLANEOUS) ×2 IMPLANT
CLIP VESOCCLUDE MED 6/CT (CLIP) ×1 IMPLANT
COVER BACK TABLE 60X90IN (DRAPES) ×2 IMPLANT
COVER SURGICAL LIGHT HANDLE (MISCELLANEOUS) ×2 IMPLANT
COVER WAND RF STERILE (DRAPES) ×1 IMPLANT
CUFF TOURNIQUET SINGLE 18IN (TOURNIQUET CUFF) IMPLANT
CUFF TOURNIQUET SINGLE 24IN (TOURNIQUET CUFF) IMPLANT
CUFF TOURNIQUET SINGLE 34IN LL (TOURNIQUET CUFF) IMPLANT
CUFF TOURNIQUET SINGLE 44IN (TOURNIQUET CUFF) IMPLANT
DRAIN CHANNEL 19F RND (DRAIN) IMPLANT
DRAPE HALF SHEET 40X57 (DRAPES) ×2 IMPLANT
DRAPE ORTHO SPLIT 77X108 STRL (DRAPES) ×2
DRAPE SURG ORHT 6 SPLT 77X108 (DRAPES) ×2 IMPLANT
DRSG ADAPTIC 3X8 NADH LF (GAUZE/BANDAGES/DRESSINGS) ×2 IMPLANT
ELECT REM PT RETURN 9FT ADLT (ELECTROSURGICAL) ×2
ELECTRODE REM PT RTRN 9FT ADLT (ELECTROSURGICAL) ×1 IMPLANT
EVACUATOR SILICONE 100CC (DRAIN) IMPLANT
GAUZE SPONGE 4X4 12PLY STRL (GAUZE/BANDAGES/DRESSINGS) ×3 IMPLANT
GLOVE BIO SURGEON STRL SZ 6.5 (GLOVE) ×1 IMPLANT
GLOVE BIO SURGEON STRL SZ7.5 (GLOVE) ×2 IMPLANT
GLOVE BIOGEL PI IND STRL 6.5 (GLOVE) IMPLANT
GLOVE BIOGEL PI INDICATOR 6.5 (GLOVE) ×1
GOWN STRL REUS W/ TWL LRG LVL3 (GOWN DISPOSABLE) ×3 IMPLANT
GOWN STRL REUS W/TWL LRG LVL3 (GOWN DISPOSABLE) ×4
KIT BASIN OR (CUSTOM PROCEDURE TRAY) ×2 IMPLANT
KIT TURNOVER KIT B (KITS) ×2 IMPLANT
NS IRRIG 1000ML POUR BTL (IV SOLUTION) ×2 IMPLANT
PACK GENERAL/GYN (CUSTOM PROCEDURE TRAY) ×2 IMPLANT
PAD ARMBOARD 7.5X6 YLW CONV (MISCELLANEOUS) ×4 IMPLANT
STAPLER VISISTAT 35W (STAPLE) ×2 IMPLANT
STOCKINETTE IMPERVIOUS LG (DRAPES) ×2 IMPLANT
SUT ETHILON 3 0 PS 1 (SUTURE) ×1 IMPLANT
SUT SILK 2 0 (SUTURE) ×1
SUT SILK 2 0 SH CR/8 (SUTURE) ×2 IMPLANT
SUT SILK 2-0 18XBRD TIE 12 (SUTURE) ×1 IMPLANT
SUT VIC AB 2-0 SH 18 (SUTURE) ×5 IMPLANT
SUT VIC AB 3-0 SH 27 (SUTURE) ×1
SUT VIC AB 3-0 SH 27X BRD (SUTURE) ×1 IMPLANT
TOWEL GREEN STERILE (TOWEL DISPOSABLE) ×4 IMPLANT
UNDERPAD 30X30 (UNDERPADS AND DIAPERS) ×2 IMPLANT
WATER STERILE IRR 1000ML POUR (IV SOLUTION) ×2 IMPLANT

## 2019-01-19 NOTE — Transfer of Care (Signed)
Immediate Anesthesia Transfer of Care Note  Patient: Danielle Buchanan  Procedure(s) Performed: AMPUTATION BELOW KNEE (Right Leg Lower)  Patient Location: PACU  Anesthesia Type:General  Level of Consciousness: sedated  Airway & Oxygen Therapy: Patient Spontanous Breathing and Patient connected to face mask oxygen  Post-op Assessment: Report given to RN and Post -op Vital signs reviewed and stable  Post vital signs: Reviewed and stable  Last Vitals:  Vitals Value Taken Time  BP 81/63 01/19/2019 12:48 PM  Temp    Pulse 77 01/19/2019 12:48 PM  Resp 16 01/19/2019 12:48 PM  SpO2 100 % 01/19/2019 12:48 PM  Vitals shown include unvalidated device data.  Last Pain:  Vitals:   01/19/19 0952  TempSrc:   PainSc: 0-No pain      Patients Stated Pain Goal: 3 (01/19/19 8250)  Complications: No apparent anesthesia complications

## 2019-01-19 NOTE — Interval H&P Note (Signed)
History and Physical Interval Note:  01/19/2019 10:31 AM  Danielle Buchanan  has presented today for surgery, with the diagnosis of ESRD.  The various methods of treatment have been discussed with the patient and family. After consideration of risks, benefits and other options for treatment, the patient has consented to  Procedure(s): AMPUTATION BELOW KNEE (Right) as a surgical intervention.  The patient's history has been reviewed, patient examined, no change in status, stable for surgery.  I have reviewed the patient's chart and labs.  Questions were answered to the patient's satisfaction.     [name]

## 2019-01-19 NOTE — Anesthesia Procedure Notes (Signed)
Procedure Name: LMA Insertion Date/Time: 01/19/2019 11:24 AM Performed by: Carmela Rima, CRNA Pre-anesthesia Checklist: Timeout performed, Patient being monitored, Suction available, Emergency Drugs available and Patient identified Patient Re-evaluated:Patient Re-evaluated prior to induction Oxygen Delivery Method: Circle system utilized Preoxygenation: Pre-oxygenation with 100% oxygen Induction Type: IV induction LMA: LMA inserted LMA Size: 4.0 Placement Confirmation: positive ETCO2 and breath sounds checked- equal and bilateral Tube secured with: Tape Dental Injury: Teeth and Oropharynx as per pre-operative assessment

## 2019-01-19 NOTE — Progress Notes (Signed)
Pt's purse delivered to nephew, [name], per pt's request. Nephew met this RN at [facility] main entrance to pickup purse.

## 2019-01-19 NOTE — Progress Notes (Signed)
Inpatient Diabetes Program Recommendations  AACE/ADA: New Consensus Statement on Inpatient Glycemic Control (2015)  Target Ranges:  Prepandial:   less than 140 mg/dL      Peak postprandial:   less than 180 mg/dL (1-2 hours)      Critically ill patients:  140 - 180 mg/dL   Results for Danielle Buchanan, Danielle Buchanan (MRN [id number]) as of 01/19/2019 15:52  Ref. Range 01/19/2019 06:23 01/19/2019 09:24 01/19/2019 12:49 01/19/2019 15:30  Glucose-Capillary Latest Ref Range: 70 - 99 mg/dL 096 (H) 283 (H) 662 (H) 340 (H)    Review of Glycemic Control  Diabetes history: DM 2 Outpatient Diabetes medications: 70/30 100 units qam, 90 units qpm Current orders for Inpatient glycemic control: 70/30 70 units qam, 60 units qpm, Novolog 0-20 units tid, Metformin 500 mg Daily  Inpatient Diabetes Program Recommendations:    Glucose trends increased. Patient received Decadron 4 mg During surgery. Patient takes more insulin at home. Please consider increasing 70/30 doses to 80 units qam, 68 units qpm.  A1c 8.5% this admission, down from 10.9 % on 11/12/2018.  Thanks,  Christena Deem RN, MSN, BC-ADM Inpatient Diabetes Coordinator Team Pager [phone] (8a-5p)

## 2019-01-19 NOTE — Anesthesia Preprocedure Evaluation (Addendum)
Anesthesia Evaluation  Patient identified by MRN, date of birth, ID band Patient awake    Reviewed: Allergy & Precautions, NPO status , Patient's Chart, lab work & pertinent test results  History of Anesthesia Complications Negative for: history of anesthetic complications  Airway Mallampati: II  TM Distance: >3 FB Neck ROM: Full    Dental  (+) Teeth Intact, Dental Advisory Given   Pulmonary sleep apnea , former smoker,    breath sounds clear to auscultation       Cardiovascular hypertension, Pt. on medications + Peripheral Vascular Disease   Rhythm:Regular     Neuro/Psych PSYCHIATRIC DISORDERS Depression    GI/Hepatic negative GI ROS, Neg liver ROS,   Endo/Other  diabetes, Type 2, Insulin DependentMorbid obesity  Renal/GU      Musculoskeletal   Abdominal   Peds  Hematology   Anesthesia Other Findings   Reproductive/Obstetrics                          Anesthesia Physical Anesthesia Plan  ASA: II  Anesthesia Plan: General and Regional   Post-op Pain Management: GA combined w/ Regional for post-op pain   Induction:   PONV Risk Score and Plan: 3 and Midazolam, Ondansetron and Dexamethasone  Airway Management Planned: LMA  Additional Equipment:   Intra-op Plan:   Post-operative Plan: Extubation in OR  Informed Consent: I have reviewed the patients History and Physical, chart, labs and discussed the procedure including the risks, benefits and alternatives for the proposed anesthesia with the patient or authorized representative who has indicated his/her understanding and acceptance.     Dental advisory given  Plan Discussed with: Anesthesiologist, CRNA and Surgeon  Anesthesia Plan Comments:       Anesthesia Quick Evaluation                                  Anesthesia Evaluation  Patient identified by MRN, date of birth, ID band Patient awake    Reviewed: Allergy &  Precautions, H&P , NPO status , Patient's Chart, lab work & pertinent test results  Airway Mallampati: II  TM Distance: >3 FB Neck ROM: Full    Dental  (+) Teeth Intact, Dental Advisory Given   Pulmonary sleep apnea , Current Smoker, former smoker,    Pulmonary exam normal breath sounds clear to auscultation       Cardiovascular hypertension, Pt. on medications + Peripheral Vascular Disease   Rhythm:Regular Rate:Normal     Neuro/Psych PSYCHIATRIC DISORDERS Depression negative neurological ROS     GI/Hepatic negative GI ROS, Neg liver ROS,   Endo/Other  diabetes, Insulin Dependent  Renal/GU negative Renal ROS     Musculoskeletal   Abdominal (+) + obese,   Peds  Hematology  (+) Blood dyscrasia, anemia ,   Anesthesia Other Findings   Reproductive/Obstetrics negative OB ROS                             Anesthesia Physical  Anesthesia Plan  ASA: III  Anesthesia Plan: General   Post-op Pain Management:    Induction: Intravenous  PONV Risk Score and Plan: 3 and Ondansetron, Dexamethasone and Midazolam  Airway Management Planned: LMA  Additional Equipment: None  Intra-op Plan:   Post-operative Plan: Extubation in OR  Informed Consent: I have reviewed the patients History and Physical, chart, labs and discussed the  procedure including the risks, benefits and alternatives for the proposed anesthesia with the patient or authorized representative who has indicated his/her understanding and acceptance.     Dental advisory given  Plan Discussed with: CRNA  Anesthesia Plan Comments:         Anesthesia Quick Evaluation

## 2019-01-19 NOTE — Anesthesia Procedure Notes (Signed)
Anesthesia Regional Block: Popliteal block   Pre-Anesthetic Checklist: ,, timeout performed, Correct Patient, Correct Site, Correct Laterality, Correct Procedure, Correct Position, site marked, Risks and benefits discussed,  Surgical consent,  Pre-op evaluation,  At surgeon's request and post-op pain management  Laterality: Right and Lower  Prep: chloraprep       Needles:  Injection technique: Single-shot     Needle Length: 9cm  Needle Gauge: 22     Additional Needles: Arrow StimuQuik ECHO Echogenic Stimulating PNB Needle  Procedures:,,,, ultrasound used (permanent image in chart),,,,  Narrative:  Start time: 01/19/2019 11:00 AM End time: 01/19/2019 11:20 AM Injection made incrementally with aspirations every 5 mL.  Performed by: Personally  Anesthesiologist: Val Eagle, MD

## 2019-01-19 NOTE — Progress Notes (Deleted)
Pt just arrived back from dialysis. Pt is to go to cath lab now. Cath lab has been notified to come pick up pt.   Haruka Kowaleski BSN, RN 

## 2019-01-19 NOTE — Op Note (Signed)
Procedure: Right below-knee amputation  Preoperative diagnosis: Gangrene right foot  Postoperative diagnosis: Same  Anesthesia Gen.  Assistant: Maureen Collins PA-C  Upper findings: #1 calcified vessels with reasonably well perfused muscle tissue  Operative details: After obtaining informed consent, the patient was taken to the operating room. The patient was placed in supine position on the operating table. After induction of general anesthesia and endotracheal intubation, the patient's entire right lower extremity was prepped and draped all the way down to the level of the ankle. Next a transverse incision was made approximately 4 fingerbreadths below the tibial tuberosity on the right leg.  The  incision was carried posteriorly to the midportion of the leg and then extended longitudinally to create a posterior flap. The subcutaneous tissues and fascia was taken down with cautery. Periosteum was raised on the tibia approximately 5 cm above the skin edge.  The periosteum was also raised on the fibula several centimeters above this. The tibia was then divided with a saw. The fibula was divided with a bone cutter. The leg was then elevated in the operative field and the amputation was completed posterior to the bones with an amputation knife. Hemostasis was then obtained with cautery and several suture ligatures. The tibial and sural nerves were pulled down into the field transected and allowed to retract up into the leg.  After hemostasis was obtained, the wound was thoroughly irrigated with  normal saline solution. The fascial edges were then reapproximated with interrupted 2-0 Vicryl sutures. Subcutaneous tissues were reapproximated using running 3-0 Vicryl suture. The skin was closed with staples. The patient tolerated the procedure well and there were no complications. Instrument sponge and  needle counts were correct at the end of the case. The patient was taken to the recovery room in stable  condition.  Elayna Tobler, MD [facility] [place] Office: [phone] Pager: [phone] 

## 2019-01-19 NOTE — Anesthesia Postprocedure Evaluation (Signed)
Anesthesia Post Note  Patient: Danielle Buchanan  Procedure(s) Performed: AMPUTATION BELOW KNEE (Right Leg Lower)     Patient location during evaluation: PACU Anesthesia Type: Regional and General Level of consciousness: awake and alert Pain management: pain level controlled Vital Signs Assessment: post-procedure vital signs reviewed and stable Respiratory status: spontaneous breathing, nonlabored ventilation, respiratory function stable and patient connected to nasal cannula oxygen Cardiovascular status: blood pressure returned to baseline and stable Postop Assessment: no apparent nausea or vomiting Anesthetic complications: no    Last Vitals:  Vitals:   01/19/19 1340 01/19/19 1352  BP: 138/66 132/62  Pulse: 76   Resp: 18   Temp: 36.7 C 37 C  SpO2: 99% 95%    Last Pain:  Vitals:   01/19/19 1413  TempSrc:   PainSc: 6                  Cathe Bilger

## 2019-01-19 NOTE — Anesthesia Procedure Notes (Signed)
Anesthesia Regional Block: Femoral nerve block   Pre-Anesthetic Checklist: ,, timeout performed, Correct Patient, Correct Site, Correct Laterality, Correct Procedure, Correct Position, site marked, Risks and benefits discussed,  Surgical consent,  Pre-op evaluation,  At surgeon's request and post-op pain management  Laterality: Right  Prep: chloraprep       Needles:  Injection technique: Single-shot     Needle Length: 9cm  Needle Gauge: 22     Additional Needles: Arrow StimuQuik ECHO Echogenic Stimulating PNB Needle  Procedures:,,,, ultrasound used (permanent image in chart),,,,  Narrative:  Start time: 01/19/2019 11:00 AM End time: 01/19/2019 11:20 AM Injection made incrementally with aspirations every 5 mL.  Performed by: Personally  Anesthesiologist: Val Eagle, MD

## 2019-01-20 ENCOUNTER — Encounter (HOSPITAL_COMMUNITY): Payer: Self-pay | Admitting: Vascular Surgery

## 2019-01-20 ENCOUNTER — Encounter (HOSPITAL_COMMUNITY): Payer: Medicare HMO

## 2019-01-20 ENCOUNTER — Ambulatory Visit: Payer: Medicare HMO | Admitting: Family

## 2019-01-20 LAB — BASIC METABOLIC PANEL
Anion gap: 12 (ref 5–15)
BUN: 12 mg/dL (ref 8–23)
CO2: 28 mmol/L (ref 22–32)
Calcium: 9.1 mg/dL (ref 8.9–10.3)
Chloride: 96 mmol/L — ABNORMAL LOW (ref 98–111)
Creatinine, Ser: 0.92 mg/dL (ref 0.44–1.00)
GFR calc Af Amer: >60 mL/min (ref >60)
GFR calc non Af Amer: >60 mL/min (ref >60)
Glucose, Bld: 162 mg/dL — ABNORMAL HIGH (ref 70–99)
Potassium: 4.4 mmol/L (ref 3.5–5.1)
Sodium: 136 mmol/L (ref 135–145)

## 2019-01-20 LAB — CBC
HCT: 35.7 % — ABNORMAL LOW (ref 36.0–46.0)
Hemoglobin: 11.4 g/dL — ABNORMAL LOW (ref 12.0–15.0)
MCH: 27.4 pg (ref 26.0–34.0)
MCHC: 31.9 g/dL (ref 30.0–36.0)
MCV: 85.8 fL (ref 80.0–100.0)
Platelets: 256 10*3/uL (ref 150–400)
RBC: 4.16 MIL/uL (ref 3.87–5.11)
RDW: 13.3 % (ref 11.5–15.5)
WBC: 9.7 10*3/uL (ref 4.0–10.5)
nRBC: 0 % (ref 0.0–0.2)

## 2019-01-20 LAB — GLUCOSE, CAPILLARY
Glucose-Capillary: 193 mg/dL — ABNORMAL HIGH (ref 70–99)
Glucose-Capillary: 153 mg/dL — ABNORMAL HIGH (ref 70–99)
Glucose-Capillary: 80 mg/dL (ref 70–99)
Glucose-Capillary: 135 mg/dL — ABNORMAL HIGH (ref 70–99)

## 2019-01-20 MED ORDER — KETOROLAC TROMETHAMINE 30 MG/ML IJ SOLN
30.0000 mg | Freq: Three times a day (TID) | INTRAMUSCULAR | Status: AC
  Administered 2019-01-20 – 2019-01-23 (×9): 30 mg via INTRAVENOUS
  Filled 2019-01-20 (×9): qty 1

## 2019-01-20 NOTE — Consult Note (Signed)
   [facility] Inpatient Consult   01/20/2019  Danielle Buchanan [facility] 12-16-1955 [id number]     Patient's chart reviewed for possible [facility] care management services as benefit from [facility] plan and 19% (medium) risk score for unplanned readmission and hospitalization. Noted with previous outreach by [facility] Care Management coordinators with community based plan of care that had focused on disease management and community resource support, but she is currently inactive.  Chart review reveals from MD history and physical dated 01/17/2019 as follows: Ms. Danielle Nattress. Buchanan is a 63 y.o. female, s/p PTA of TPT and peroneal artery by Dr Myra Gianotti 2/20, subsequently had an open TMA and has been doing local wound care. (had 5 toes on right foot amputated 2 months ago). Other medical problems include depression, sleep apnea, dyslipidemia which have been stable.  She was sent to the ED from home for deterioration of wound and foul smell, glucose has been elevated, underwent right below-knee amputation. Inpatient DM coordinator is following patient during this admission.  Patient is being recommended per PT/ OT to SNF (skilled nursing facility) stay for rehab prior to returning to her handicap accessible apartment, since she is living alone.   Will follow disposition. Please place a [facility] Care Management referral if there are changes in disposition needs for [facility] Care management services as appropriate.    For additional questions or referrals please contact:  Karin Golden A. Laila Myhre, BSN, RN-BC [facility] Liaison Cell: [phone]

## 2019-01-20 NOTE — Progress Notes (Addendum)
  Progress Note    01/20/2019 7:30 AM 1 Day Post-Op  Subjective:  Pain overnight R BKA stump   Vitals:   01/19/19 1938 01/20/19 0634  BP: (!) 141/72 (!) 145/77  Pulse: 76 64  Resp:    Temp: 98.6 F (37 C) 97.7 F (36.5 C)  SpO2: 96%     Physical Exam: Incisions:  Dressing left in place, no breakthrough bleeding   CBC    Component Value Date/Time   WBC 9.7 01/20/2019 0440   RBC 4.16 01/20/2019 0440   HGB 11.4 (L) 01/20/2019 0440   HCT 35.7 (L) 01/20/2019 0440   PLT 256 01/20/2019 0440   MCV 85.8 01/20/2019 0440   MCH 27.4 01/20/2019 0440   MCHC 31.9 01/20/2019 0440   RDW 13.3 01/20/2019 0440   LYMPHSABS 1.2 01/17/2019 1240   MONOABS 0.5 01/17/2019 1240   EOSABS 0.2 01/17/2019 1240   BASOSABS 0.1 01/17/2019 1240    BMET    Component Value Date/Time   NA 136 01/20/2019 0440   K 4.4 01/20/2019 0440   CL 96 (L) 01/20/2019 0440   CO2 28 01/20/2019 0440   GLUCOSE 162 (H) 01/20/2019 0440   BUN 12 01/20/2019 0440   CREATININE 0.92 01/20/2019 0440   CALCIUM 9.1 01/20/2019 0440   GFRNONAA >60 01/20/2019 0440   GFRAA >60 01/20/2019 0440    INR    Component Value Date/Time   INR 1.08 11/19/2018 0444     Intake/Output Summary (Last 24 hours) at 01/20/2019 0730 Last data filed at 01/20/2019 0321 Gross per 24 hour  Intake 1618.04 ml  Output 1300 ml  Net 318.04 ml     Assessment/Plan:  63 y.o. female is s/p right below knee amputation  1 Day Post-Op  - Dressing change tomorrow  - Pain medication on schedule today - Therapy teams consulted   Danielle Rutter, PA-C [facility] [phone] 01/20/2019 7:30 AM   Agree with the above.  I have seen and examined the patient.  Plan for dressing change tomorrow.  Will try toradol for pain control.  Danielle Buchanan

## 2019-01-20 NOTE — Progress Notes (Signed)
PT Evaluation  Clinical Impression: Pt is very painful on entry and did not want to get up but was able to be convinced of the benefits of mobility in pain reduction. Pt requires maxAx2 for coming to longsitting and pivoting in bed and modAx2 for anterior-posterior transfer to recliner. Pt instructed to sit up for 1 hour before returning to bed. Pt will benefit from SNF level care prior to returning to her handicap accessible apartment. PT will continue to follow acutely.     01/20/19 0900  PT Visit Information  Last PT Received On 01/20/19  Assistance Needed +2  PT/OT/SLP Co-Evaluation/Treatment Yes  PT goals addressed during session Mobility/safety with mobility  History of Present Illness 63 y.o. female, s/p PTA of TPT and peroneal artery by Dr Myra Gianotti 2/20.  She subsequently had an open TMA and has been doing local wound care. PMH: L AKA,depression, sleep apnea, dyslipidemia and CAD. s/p R BKA 01/19/19  Precautions  Precautions Fall  Precaution Comments now bilateral amputee with change to CoG causing increased posterior lean  Restrictions  Weight Bearing Restrictions Yes  RLE Weight Bearing NWB  LLE Weight Bearing NWB  Home Living  Family/patient expects to be discharged to: Private residence  Living Arrangements Alone  Available Help at Discharge Family;Available PRN/intermittently (niece and nephew help out )  Type of Home Apartment  Home Access Level entry  Home Layout One level  Bathroom Shower/Tub Tub/shower unit  Bathroom Toilet Handicapped height  Bathroom Accessibility Yes  Home Equipment Tub bench;Grab bars - tub/shower;Hand held shower head;Wheelchair - manual  Prior Function  Level of Independence Needs assistance  Gait / Transfers Assistance Needed mostly transferring independently to w/c utilizing R LE to mobilize with w/c. sometimes requires nephews help and able to transfer to/from tub bench   ADL's / Homemaking Assistance Needed family does cleaning, niece and  nephew take her to Dr's appointments and shopping  Communication  Communication No difficulties  Pain Assessment  Pain Assessment 0-10  Pain Score 10  Pain Location R LE residual limb,   Pain Descriptors / Indicators Grimacing;Guarding;Throbbing;Aching;Burning  Pain Intervention(s) Limited activity within patient's tolerance;Monitored during session;Repositioned;Patient requesting pain meds-RN notified;RN gave pain meds during session  Cognition  Arousal/Alertness Awake/alert  Behavior During Therapy [facility] for tasks assessed/performed  Overall Cognitive Status Within Functional Limits for tasks assessed  General Comments Reports she had to have dog Mango put down in October 10, 2023 and her sister died last 2023/04/10, depressed   Upper Extremity Assessment  Upper Extremity Assessment Defer to OT evaluation  Lower Extremity Assessment  Lower Extremity Assessment RLE deficits/detail;LLE deficits/detail  RLE Deficits / Details new BKA, increased pain and decreased knee extension   RLE Unable to fully assess due to pain  LLE Deficits / Details prior AKA   Bed Mobility  Overal bed mobility Needs Assistance  Bed Mobility Supine to Sit  Supine to sit +2 for physical assistance;HOB elevated;Max assist  General bed mobility comments maxAx2 for coming to longsitting and pivoting to EoB, vc for hand placement to aid in pivot  Transfers  Overall transfer level Needs assistance  Equipment used None  Transfers Anterior-Posterior Transfer  Anterior-Posterior transfers +2 physical assistance;Mod assist  General transfer comment modAx2 for assisting with pad lift for posterior transfer to recliner perpendicular to the bed, pt able to assist with UE push to assist in offweighting hips for scooting, requires posterior support with each scoot to reduce risk of falling backward due to change in CoG  Balance  Overall  balance assessment Needs assistance  Sitting-balance support Bilateral upper extremity  supported (bilateral residual limbs supported)  Sitting balance-Leahy Scale Poor  Sitting balance - Comments pt with LoB with each scoot backwards due to change in CoG  General Comments  General comments (skin integrity, edema, etc.) NT and mobility tech instructed in how to return to bed   PT - End of Session  Activity Tolerance Patient limited by pain  Patient left in chair;with call bell/phone within reach;with chair alarm set  Nurse Communication Other (comment);Mobility status;Patient requests pain meds;Need for lift equipment;Precautions;Weight bearing status (NT and mobility tech instructed in how to return to bed )  PT Assessment  PT Recommendation/Assessment Patient needs continued PT services  PT Visit Diagnosis Other abnormalities of gait and mobility (R26.89);Muscle weakness (generalized) (M62.81);Difficulty in walking, not elsewhere classified (R26.2);Pain  Pain - Right/Left Right  Pain - part of body  (residual limb )  PT Problem List Decreased strength;Decreased range of motion;Decreased activity tolerance;Decreased balance;Decreased mobility;Cardiopulmonary status limiting activity;Pain  Barriers to Discharge Decreased caregiver support  PT Plan  PT Frequency (ACUTE ONLY) Min 2X/week  PT Treatment/Interventions (ACUTE ONLY) DME instruction;Functional mobility training;Therapeutic activities;Therapeutic exercise;Balance training;Cognitive remediation;Wheelchair mobility training;Patient/family education  AM-PAC PT "6 Clicks" Mobility Outcome Measure (Version 2)  Help needed turning from your back to your side while in a flat bed without using bedrails? 2  Help needed moving from lying on your back to sitting on the side of a flat bed without using bedrails? 1  Help needed moving to and from a bed to a chair (including a wheelchair)? 1  Help needed standing up from a chair using your arms (e.g., wheelchair or bedside chair)? 1  Help needed to walk in hospital room? 1  Help  needed climbing 3-5 steps with a railing?  1  6 Click Score 7  Consider Recommendation of Discharge To: CIR/SNF/LTACH  PT Recommendation  Follow Up Recommendations SNF  PT equipment Other (comment) (TBD at next venue)  Individuals Consulted  Consulted and Agree with Results and Recommendations Patient  Acute Rehab PT Goals  Patient Stated Goal go home  PT Goal Formulation With patient  Time For Goal Achievement 02/03/19  Potential to Achieve Goals Fair  PT Time Calculation  PT Start Time (ACUTE ONLY) 0939  PT Stop Time (ACUTE ONLY) 1010  PT Time Calculation (min) (ACUTE ONLY) 31 min  PT General Charges  $$ ACUTE PT VISIT 1 Visit  PT Evaluation  $PT Eval Moderate Complexity 1 Mod  Written Expression  Dominant Hand Right    Torence Palmeri B. Beverely RisenVan Fleet PT, DPT Acute Rehabilitation Services Pager [phone] Office [phone]

## 2019-01-20 NOTE — Evaluation (Signed)
Occupational Therapy Evaluation Patient Details Name: Danielle Buchanan MRN: [id number] DOB: Aug 19, 1956 Today's Date: 01/20/2019    History of Present Illness 63 y.o. female, s/p PTA of TPT and peroneal artery by Dr Myra Gianotti 2/20.  She subsequently had an open TMA and has been doing local wound care. PMH: L AKA,depression, sleep apnea, dyslipidemia and CAD. s/p R BKA 01/19/19   Clinical Impression   PTA, pt was living alone and was performing ADLs and used w/c for mobility; pt's niece and nephew performs her IADLs including cooking, cleaning, and driving. Pt currently requiring Min A for UB ADLs, Max A for LB ADLs, and Mod A +2 for anterior/posterior transfer to recliner. Pt presenting with decreased strength, balance, and activity tolerance with increased pain. Pt would benefit from further acute OT to facilitate safe dc. Recommend dc to SNF for further OT to optimize safety, independence with ADLs, and return to PLOF.      Follow Up Recommendations  SNF;Supervision/Assistance - 24 hour    Equipment Recommendations  Other (comment)(defer to next venue)    Recommendations for Other Services PT consult     Precautions / Restrictions Precautions Precautions: Fall Precaution Comments: now bilateral amputee with change to CoG causing increased posterior lean Restrictions Weight Bearing Restrictions: Yes RLE Weight Bearing: Non weight bearing      Mobility Bed Mobility Overal bed mobility: Needs Assistance Bed Mobility: Supine to Sit     Supine to sit: +2 for physical assistance;HOB elevated;Max assist     General bed mobility comments: maxAx2 for coming to longsitting and pivoting to EoB, vc for hand placement to aid in pivot  Transfers Overall transfer level: Needs assistance Equipment used: None Transfers: Licensed conveyancer transfers: +2 physical assistance;Mod assist   General transfer comment: modAx2 for assisting with pad lift for  posterior transfer to recliner perpendicular to the bed, pt able to assist with UE push to assist in offweighting hips for scooting, requires posterior support with each scoot to reduce risk of falling backward due to change in CoG    Balance Overall balance assessment: Needs assistance Sitting-balance support: Bilateral upper extremity supported(bilateral residual limbs supported) Sitting balance-Leahy Scale: Poor Sitting balance - Comments: pt with LoB with each scoot backwards due to change in CoG                                   ADL either performed or assessed with clinical judgement   ADL Overall ADL's : Needs assistance/impaired Eating/Feeding: Independent;Sitting   Grooming: Set up;Supervision/safety;Sitting   Upper Body Bathing: Minimal assistance;Sitting   Lower Body Bathing: Moderate assistance;Bed level;Sitting/lateral leans   Upper Body Dressing : Minimal assistance;Sitting   Lower Body Dressing: Maximal assistance;Sitting/lateral leans;Bed level   Toilet Transfer: Moderate assistance;+2 for physical assistance;+2 for safety/equipment Toilet Transfer Details (indicate cue type and reason): Mod A +2 for posterior transfer to recliner. Using bed pads to facilitate hips back into the chair. Cues for sequencing.          Functional mobility during ADLs: Moderate assistance;+2 for physical assistance;+2 for safety/equipment General ADL Comments: Pt demonstrating decreased functional performance, balance, and strength.     Vision         Perception     Praxis      Pertinent Vitals/Pain Pain Assessment: 0-10 Pain Score: 10-Worst pain ever Pain Location: R LE residual limb,  Pain Descriptors /  Indicators: Grimacing;Guarding;Throbbing;Aching;Burning Pain Intervention(s): Monitored during session;Limited activity within patient's tolerance;Repositioned     Hand Dominance Right   Extremity/Trunk Assessment Upper Extremity Assessment Upper  Extremity Assessment: Generalized weakness   Lower Extremity Assessment Lower Extremity Assessment: RLE deficits/detail;LLE deficits/detail RLE Deficits / Details: new BKA, increased pain and decreased knee extension  RLE: Unable to fully assess due to pain LLE Deficits / Details: prior AKA    Cervical / Trunk Assessment Cervical / Trunk Assessment: Other exceptions Cervical / Trunk Exceptions: Poor trunk control with posterior lean   Communication Communication Communication: No difficulties   Cognition Arousal/Alertness: Awake/alert Behavior During Therapy: WFL for tasks assessed/performed Overall Cognitive Status: Within Functional Limits for tasks assessed                                 General Comments: Reports she had to have dog Mango put down in Oct 03, 2023 and her sister died last 04/03/2023, depressed    General Comments  RN, NT, and mobility tech instructed in how to return to bed     Exercises     Shoulder Instructions      Home Living Family/patient expects to be discharged to:: Private residence Living Arrangements: Alone Available Help at Discharge: Family;Available PRN/intermittently(niece and nephew help out ) Type of Home: Apartment Home Access: Level entry     Home Layout: One level     Bathroom Shower/Tub: Chief Strategy Officer: Handicapped height Bathroom Accessibility: Yes   Home Equipment: Tub bench;Grab bars - tub/shower;Hand held shower head;Wheelchair - manual          Prior Functioning/Environment Level of Independence: Needs assistance  Gait / Transfers Assistance Needed: mostly transferring independently to w/c utilizing R LE to mobilize with w/c. sometimes requires nephews help and able to transfer to/from tub bench  ADL's / Homemaking Assistance Needed: family does cleaning, niece and nephew take her to Dr's appointments and shopping            OT Problem List: Decreased strength;Decreased range of  motion;Decreased activity tolerance;Impaired balance (sitting and/or standing);Decreased knowledge of use of DME or AE;Decreased knowledge of precautions;Pain      OT Treatment/Interventions: Self-care/ADL training;Therapeutic exercise;Energy conservation;DME and/or AE instruction;Therapeutic activities;Patient/family education    OT Goals(Current goals can be found in the care plan section) Acute Rehab OT Goals Patient Stated Goal: go home OT Goal Formulation: With patient Time For Goal Achievement: 02/03/19 Potential to Achieve Goals: Good  OT Frequency: Min 2X/week   Barriers to D/C:            Co-evaluation PT/OT/SLP Co-Evaluation/Treatment: Yes Reason for Co-Treatment: For patient/therapist safety;To address functional/ADL transfers   OT goals addressed during session: ADL's and self-care      AM-PAC OT "6 Clicks" Daily Activity     Outcome Measure Help from another person eating meals?: None Help from another person taking care of personal grooming?: A Little Help from another person toileting, which includes using toliet, bedpan, or urinal?: A Lot Help from another person bathing (including washing, rinsing, drying)?: A Lot Help from another person to put on and taking off regular upper body clothing?: A Little Help from another person to put on and taking off regular lower body clothing?: A Lot 6 Click Score: 16   End of Session Nurse Communication: Mobility status;Weight bearing status  Activity Tolerance: Patient limited by pain;Patient limited by fatigue Patient left: in chair;with call bell/phone within reach;with  chair alarm set  OT Visit Diagnosis: Unsteadiness on feet (R26.81);Other abnormalities of gait and mobility (R26.89);Muscle weakness (generalized) (M62.81);Other symptoms and signs involving cognitive function;Pain Pain - Right/Left: Right Pain - part of body: Leg                Time: [id number] OT Time Calculation (min): 33 min Charges:  OT General  Charges $OT Visit: 1 Visit OT Evaluation $OT Eval Moderate Complexity: 1 Mod  Sophia Cubero MSOT, OTR/L Acute Rehab Pager: [phone] Office: [phone]  Theodoro GristCharis M Meleah Demeyer 01/20/2019, 4:39 PM

## 2019-01-21 LAB — GLUCOSE, CAPILLARY
Glucose-Capillary: 124 mg/dL — ABNORMAL HIGH (ref 70–99)
Glucose-Capillary: 180 mg/dL — ABNORMAL HIGH (ref 70–99)
Glucose-Capillary: 188 mg/dL — ABNORMAL HIGH (ref 70–99)
Glucose-Capillary: 189 mg/dL — ABNORMAL HIGH (ref 70–99)
Glucose-Capillary: 220 mg/dL — ABNORMAL HIGH (ref 70–99)

## 2019-01-21 LAB — CBC
HCT: 35.2 % — ABNORMAL LOW (ref 36.0–46.0)
Hemoglobin: 11 g/dL — ABNORMAL LOW (ref 12.0–15.0)
MCH: 26.6 pg (ref 26.0–34.0)
MCHC: 31.3 g/dL (ref 30.0–36.0)
MCV: 85.2 fL (ref 80.0–100.0)
Platelets: 223 10*3/uL (ref 150–400)
RBC: 4.13 MIL/uL (ref 3.87–5.11)
RDW: 13.4 % (ref 11.5–15.5)
WBC: 7.6 10*3/uL (ref 4.0–10.5)
nRBC: 0 % (ref 0.0–0.2)

## 2019-01-21 LAB — BASIC METABOLIC PANEL
Anion gap: 12 (ref 5–15)
BUN: 14 mg/dL (ref 8–23)
CO2: 26 mmol/L (ref 22–32)
Calcium: 8.9 mg/dL (ref 8.9–10.3)
Chloride: 96 mmol/L — ABNORMAL LOW (ref 98–111)
Creatinine, Ser: 0.84 mg/dL (ref 0.44–1.00)
GFR calc Af Amer: >60 mL/min (ref >60)
GFR calc non Af Amer: >60 mL/min (ref >60)
Glucose, Bld: 178 mg/dL — ABNORMAL HIGH (ref 70–99)
Potassium: 3.6 mmol/L (ref 3.5–5.1)
Sodium: 134 mmol/L — ABNORMAL LOW (ref 135–145)

## 2019-01-21 NOTE — Progress Notes (Addendum)
  Progress Note    01/21/2019 7:47 AM 2 Days Post-Op  Subjective:  Pain overnight   Vitals:   01/21/19 0200 01/21/19 0420  BP:  (!) 156/82  Pulse:  75  Resp: 19 10  Temp:  97.6 F (36.4 C)  SpO2: 96% 100%    Physical Exam: Incisions:  R BKA incision c/d/i, no areas of fluctuance, skin edges viable   CBC    Component Value Date/Time   WBC 7.6 01/21/2019 0436   RBC 4.13 01/21/2019 0436   HGB 11.0 (L) 01/21/2019 0436   HCT 35.2 (L) 01/21/2019 0436   PLT 223 01/21/2019 0436   MCV 85.2 01/21/2019 0436   MCH 26.6 01/21/2019 0436   MCHC 31.3 01/21/2019 0436   RDW 13.4 01/21/2019 0436   LYMPHSABS 1.2 01/17/2019 1240   MONOABS 0.5 01/17/2019 1240   EOSABS 0.2 01/17/2019 1240   BASOSABS 0.1 01/17/2019 1240    BMET    Component Value Date/Time   NA 134 (L) 01/21/2019 0436   K 3.6 01/21/2019 0436   CL 96 (L) 01/21/2019 0436   CO2 26 01/21/2019 0436   GLUCOSE 178 (H) 01/21/2019 0436   BUN 14 01/21/2019 0436   CREATININE 0.84 01/21/2019 0436   CALCIUM 8.9 01/21/2019 0436   GFRNONAA >60 01/21/2019 0436   GFRAA >60 01/21/2019 0436    INR    Component Value Date/Time   INR 1.08 11/19/2018 0444     Intake/Output Summary (Last 24 hours) at 01/21/2019 0747 Last data filed at 01/21/2019 0521 Gross per 24 hour  Intake 962.69 ml  Output 1750 ml  Net -787.31 ml     Assessment/Plan:  63 y.o. female is s/p right below knee amputation  2 Days Post-Op  - dressing changed today; stump sock ordered - PT/OT recommending SNF - CSW consulted for SNF placement - Ok for discharge when pain controlled better   Emilie Rutter, PA-C [facility] [phone] 01/21/2019 7:47 AM  I agree with the above.  Have seen and evaluated the patient.  Discharge planning in progress.  [name]

## 2019-01-21 NOTE — Progress Notes (Signed)
Physical Therapy Treatment Patient Details Name: Danielle ContesJanice M Real MRN: [id number] DOB: November 20, 1955 Today's Date: 01/21/2019    History of Present Illness 63 y.o. female, s/p PTA of TPT and peroneal artery by Dr Myra GianottiBrabham 2/20.  She subsequently had an open TMA and has been doing local wound care. PMH: L AKA,depression, sleep apnea, dyslipidemia and CAD. s/p R BKA 01/19/19    PT Comments    Patient progressing well with therapy today. Performed sliding board transfer from chair uphill to bed with contact guard and assist with board positioning. Will continue to progress her independence with functional transfers.    Follow Up Recommendations  SNF     Equipment Recommendations  Other (comment)    Recommendations for Other Services       Precautions / Restrictions Precautions Precautions: Fall Restrictions RLE Weight Bearing: Non weight bearing LLE Weight Bearing: Non weight bearing    Mobility  Bed Mobility Overal bed mobility: Needs Assistance Bed Mobility: Supine to Sit     Supine to sit: Modified independent (Device/Increase time);Mod assist     General bed mobility comments: assist to raise trunk dependent on level of HOB, performed x 5   Transfers Overall transfer level: Needs assistance Equipment used: None Transfers: Lateral/Scoot Transfers          Lateral/Scoot Transfers: Min guard General transfer comment: lateral scooting on transfer board, assist with positioning and cues for technique.   Ambulation/Gait                 Stairs             Wheelchair Mobility    Modified Rankin (Stroke Patients Only)       Balance Overall balance assessment: Needs assistance   Sitting balance-Leahy Scale: Fair Sitting balance - Comments: in long sitting                                    Cognition Arousal/Alertness: Awake/alert Behavior During Therapy: WFL for tasks assessed/performed Overall Cognitive Status: Within Functional  Limits for tasks assessed                                        Exercises General Exercises - Upper Extremity Shoulder Flexion: Strengthening;Both;10 reps;Seated;Theraband Theraband Level (Shoulder Flexion): Level 3 (Green) Shoulder Extension: Strengthening;Both;10 reps;Seated;Theraband Theraband Level (Shoulder Extension): Level 3 (Green) Shoulder Horizontal ABduction: Strengthening;Both;10 reps;Seated;Theraband Theraband Level (Shoulder Horizontal Abduction): Level 3 (Green) Elbow Flexion: Strengthening;Both;10 reps;Seated;Theraband Theraband Level (Elbow Flexion): Level 3 (Green) Elbow Extension: Strengthening;Both;10 reps;Seated;Theraband Theraband Level (Elbow Extension): Level 3 (Green)    General Comments        Pertinent Vitals/Pain Pain Assessment: Faces Faces Pain Scale: Hurts little more Pain Location: R LE residual limb,  Pain Descriptors / Indicators: Sore Pain Intervention(s): Monitored during session;Repositioned    Home Living                      Prior Function            PT Goals (current goals can now be found in the care plan section) Acute Rehab PT Goals Patient Stated Goal: go home PT Goal Formulation: With patient Time For Goal Achievement: 02/03/19 Potential to Achieve Goals: Fair Progress towards PT goals: Progressing toward goals    Frequency    Min 2X/week  PT Plan Current plan remains appropriate    Co-evaluation              AM-PAC PT "6 Clicks" Mobility   Outcome Measure  Help needed turning from your back to your side while in a flat bed without using bedrails?: A Lot Help needed moving from lying on your back to sitting on the side of a flat bed without using bedrails?: Total Help needed moving to and from a bed to a chair (including a wheelchair)?: Total Help needed standing up from a chair using your arms (e.g., wheelchair or bedside chair)?: Total Help needed to walk in hospital room?:  Total Help needed climbing 3-5 steps with a railing? : Total 6 Click Score: 7    End of Session     Patient left: in chair;with call bell/phone within reach;with chair alarm set Nurse Communication: Other (comment);Mobility status;Patient requests pain meds;Need for lift equipment;Precautions;Weight bearing status PT Visit Diagnosis: Other abnormalities of gait and mobility (R26.89);Muscle weakness (generalized) (M62.81);Difficulty in walking, not elsewhere classified (R26.2);Pain Pain - Right/Left: Right     Time: 1130-1145 PT Time Calculation (min) (ACUTE ONLY): 15 min  Charges:  $Therapeutic Activity: 8-22 mins                     Etta Grandchild, PT, DPT Acute Rehabilitation Services Pager: [phone] Office: [phone]     Etta Grandchild 01/21/2019, 1:27 PM

## 2019-01-21 NOTE — Progress Notes (Signed)
Occupational Therapy Treatment Patient Details Name: Danielle ContesJanice M Buchanan MRN: [id number] DOB: May 24, 1956 Today's Date: 01/21/2019    History of present illness 63 y.o. female, s/p PTA of TPT and peroneal artery by Dr Myra GianottiBrabham 2/20.  She subsequently had an open TMA and has been doing local wound care. PMH: L AKA,depression, sleep apnea, dyslipidemia and CAD. s/p R BKA 01/19/19   OT comments  Focus of session on educating pt in activities she can perform on her own between therapy sessions including supine>sit using bed rails to achieve long sitting, reaching activities in long sitting and B UE exercise program with level 3 theraband. Pt with Sp02 of 93% or greater on RA.   Follow Up Recommendations  SNF;Supervision/Assistance - 24 hour    Equipment Recommendations  Other (comment)(defer to next venue)    Recommendations for Other Services      Precautions / Restrictions Precautions Precautions: Fall Restrictions RLE Weight Bearing: Non weight bearing LLE Weight Bearing: Non weight bearing       Mobility Bed Mobility Overal bed mobility: Needs Assistance Bed Mobility: Supine to Sit     Supine to sit: Modified independent (Device/Increase time);Mod assist     General bed mobility comments: assist to raise trunk dependent on level of HOB, performed x 5   Transfers                      Balance Overall balance assessment: Needs assistance   Sitting balance-Leahy Scale: Fair Sitting balance - Comments: in long sitting                                   ADL either performed or assessed with clinical judgement   ADL                                         General ADL Comments: Worked at bed level on pulling trunk from semi reclined into sitting using B bed rails followed by sitting balance reaching within BoS in long sitting.     Vision       Perception     Praxis      Cognition Arousal/Alertness: Awake/alert Behavior During  Therapy: WFL for tasks assessed/performed Overall Cognitive Status: Within Functional Limits for tasks assessed                                          Exercises Exercises: General Upper Extremity General Exercises - Upper Extremity Shoulder Flexion: Strengthening;Both;10 reps;Seated;Theraband Theraband Level (Shoulder Flexion): Level 3 (Green) Shoulder Extension: Strengthening;Both;10 reps;Seated;Theraband Theraband Level (Shoulder Extension): Level 3 (Green) Shoulder Horizontal ABduction: Strengthening;Both;10 reps;Seated;Theraband Theraband Level (Shoulder Horizontal Abduction): Level 3 (Green) Elbow Flexion: Strengthening;Both;10 reps;Seated;Theraband Theraband Level (Elbow Flexion): Level 3 (Green) Elbow Extension: Strengthening;Both;10 reps;Seated;Theraband Theraband Level (Elbow Extension): Level 3 (Green)   Shoulder Instructions       General Comments      Pertinent Vitals/ Pain       Pain Assessment: Faces Faces Pain Scale: Hurts little more Pain Location: R LE residual limb,  Pain Descriptors / Indicators: Sore Pain Intervention(s): Monitored during session;Repositioned  Home Living  Prior Functioning/Environment              Frequency  Min 2X/week        Progress Toward Goals  OT Goals(current goals can now be found in the care plan section)  Progress towards OT goals: Progressing toward goals  Acute Rehab OT Goals Patient Stated Goal: go home OT Goal Formulation: With patient Time For Goal Achievement: 02/03/19 Potential to Achieve Goals: Good  Plan Discharge plan remains appropriate    Co-evaluation                 AM-PAC OT "6 Clicks" Daily Activity     Outcome Measure   Help from another person eating meals?: None Help from another person taking care of personal grooming?: A Little Help from another person toileting, which includes using toliet, bedpan,  or urinal?: A Lot Help from another person bathing (including washing, rinsing, drying)?: A Lot Help from another person to put on and taking off regular upper body clothing?: A Little Help from another person to put on and taking off regular lower body clothing?: A Lot 6 Click Score: 16    End of Session    OT Visit Diagnosis: Muscle weakness (generalized) (M62.81);Other symptoms and signs involving cognitive function;Pain Pain - Right/Left: Right Pain - part of body: Leg   Activity Tolerance Patient tolerated treatment well   Patient Left in bed;with call bell/phone within reach;with bed alarm set   Nurse Communication          Time: 5638-7564 OT Time Calculation (min): 29 min  Charges: OT General Charges $OT Visit: 1 Visit OT Treatments $Therapeutic Exercise: 23-37 mins  Martie Round, OTR/L Acute Rehabilitation Services Pager: [phone] Office: [phone]  Evern Bio 01/21/2019, 10:44 AM

## 2019-01-21 NOTE — TOC Initial Note (Signed)
Transition of Care [facility]) - Initial/Assessment Note    Patient Details  Name: Danielle Buchanan MRN: [id number] Date of Birth: September 03, 1956  Transition of Care [facility]) CM/SW Contact:    Eduard Roux, LCSWA Phone Number: 01/21/2019, 5:05 PM  Clinical Narrative:                 CSW received consult for discharge needs. CSW spoke with patient regarding PT recommendation of SNF placement at time of discharge. Patient states she lives in a handicap accessible apartment. She lives alone but has the support of her nephew.   Patient recognizes need for rehab before returning home and is agreeable to a SNF placement. Patient reported preference for [facility]. CSW explained some of the current challenges with getting SNF placement and she needs to select a few others for back up. CSW gave the patient the Medicare.gov SNF listing.   Patient/family is realistic regarding therapy needs and expressed being hopeful for SNF placement. Patient expressed understanding of CSW role and discharge process as well as medical condition. No questions/concerns about plan or treatment at this time.    Expected Discharge Plan: Skilled Nursing Facility Barriers to Discharge: SNF Pending bed offer   Patient Goals and CMS Choice Patient states their goals for this hospitalization and ongoing recovery are:: want to go home CMS Medicare.gov Compare Post Acute Care list provided to:: Patient Choice offered to / list presented to : Patient  Expected Discharge Plan and Services Expected Discharge Plan: Skilled Nursing Facility       Living arrangements for the past 2 months: Apartment Expected Discharge Date: 01/23/19                        Prior Living Arrangements/Services Living arrangements for the past 2 months: Apartment Lives with:: Self Patient language and need for interpreter reviewed:: No Do you feel safe going back to the place where you live?: No(going to SNF for ST rehab)      Need for Family  Participation in Patient Care: Yes (Comment) Care giver support system in place?: Yes (comment)   Criminal Activity/Legal Involvement Pertinent to Current Situation/Hospitalization: No - Comment as needed  Activities of Daily Living Home Assistive Devices/Equipment: Grab bars in shower, Hand-held shower hose, Long-handled sponge, Raised toilet seat with rails, Shower chair with back, Wheelchair ADL Screening (condition at time of admission) Patient's cognitive ability adequate to safely complete daily activities?: Yes Is the patient deaf or have difficulty hearing?: No Does the patient have difficulty seeing, even when wearing glasses/contacts?: No Does the patient have difficulty concentrating, remembering, or making decisions?: No Patient able to express need for assistance with ADLs?: Yes Does the patient have difficulty dressing or bathing?: No Independently performs ADLs?: Yes (appropriate for developmental age) Does the patient have difficulty walking or climbing stairs?: Yes Weakness of Legs: Both Weakness of Arms/Hands: None  Permission Sought/Granted Permission sought to share information with : Case Manager, Family Supports, Oceanographer granted to share information with : Yes, Verbal Permission Granted  Share Information with NAME: Richardson,Greg  Permission granted to share info w AGENCY: SNFs  Permission granted to share info w Relationship: Nephew   Permission granted to share info w Contact Information: [phone]  Emotional Assessment Appearance:: Appears stated age Attitude/Demeanor/Rapport: Engaged Affect (typically observed): Accepting(appears to be in pain) Orientation: : Oriented to Self, Oriented to Place, Oriented to  Time, Oriented to Situation Alcohol / Substance Use: Not Applicable Psych Involvement:  No (comment)  Admission diagnosis:  Gangrene of right foot [facility](HCC) [I96] Patient Active Problem List   Diagnosis Date Noted   . Gangrene of right foot (HCC) 01/17/2019  . History of transmetatarsal amputation of right foot (HCC) 12/13/2018  . At risk for adverse drug event 11/27/2018  . Osteomyelitis of third toe of right foot (HCC) 11/12/2018  . Pain in finger of right hand 02/20/2018  . Atherosclerosis of native arteries of the extremities with gangrene (HCC) 11/01/2017  . Unilateral AKA, left (HCC)   . Post-operative pain   . OSA (obstructive sleep apnea)   . Benign essential HTN   . DM (diabetes mellitus), secondary, uncontrolled, with peripheral vascular complications (HCC)   . Hyperkalemia   . Leukocytosis   . Acute blood loss anemia   . Amputation stump infection (HCC) 08/23/2017  . Amputation of left lower extremity above knee upon examination (HCC) 08/23/2017  . PAD (peripheral artery disease) (HCC) 07/23/2017  . Cellulitis 06/10/2017  . Hyponatremia 06/10/2017  . Fever 06/10/2017  . Rash 05/24/2017  . HTN (hypertension)   . Allergic rhinitis   . Insomnia   . Depression    PCP:  Shirlean MylarWebb, Carol, MD Pharmacy:   [id number] [address]- [place], [place] - [phone] [place] [address] Phone: [phone] Fax: [phone]  [facility] [place], [place] - [address] [place] [id number] Phone: [phone] Fax: [phone]     Social Determinants of Health (SDOH) Interventions    Readmission Risk Interventions No flowsheet data found.

## 2019-01-21 NOTE — Progress Notes (Signed)
Orthopedic Tech Progress Note Patient Details:  Danielle Buchanan 01/29/1956 [id number] Called in order to [facility] Patient ID: [name], female   DOB: [birth year], 63 y.o.   MRN: [id number]   Donald Pore 01/21/2019, 8:49 AM

## 2019-01-21 NOTE — Plan of Care (Signed)
Care plan has been reviewed:  Problem: Pain Managment: had severe pain scale 9-10/10. Goal: General experience of comfort will improve Outcome: Progressing: alternated pain controlled regimen effectively with Oxycodone, Morphine and Ketorolac. Pt sleeps well tonight.   Problem: Clinical Measurements: post op right BKA, DM Goal: Will remain free from infection Outcome: Progressing: afebrile, dressing dry, clean and intact,will change dressing daily by MD at am. Zosyn q 8 hrs IV. Check blood glucose ACHS.    Problem: Clinical Measurements: hypertension, on cardiac monitoring. Goal: Cardiovascular complication will be avoided Outcome: Progressing: NSR on monitor, BP remained stable.  Problem: Clinical Measurements: SPO2 88% with room air when sleep at night. Goal: Respiratory complications will improve Outcome: Progressing: 2 LPM of NCL given, SPO2 maintained above 92%, no respiratory distress had seen.  Problem: Elimination: limited movement, maximum assist, experiencing urinary incontinent, on external catheter "Purewick". Concern that Pt may have constipation from opioid. Goal: Will not experience complications related to urinary retention,Will not experience complications related to bowel motility Outcome: Progressing: good amount of urine out put, last BM was 01/19/2019. Pt has very good oral fluid intake.  Problem: Coping: anxiety, pain, inability to ambulate, being dependent. Goal: Level of anxiety will decrease Outcome: Progressing: emotional and physical support, frequent verbal contact with active listening.Pt has already been taking psychiatric medications. Will continue to monitor.      Filiberto Pinks, BSN, RN, PCCN-CMC,CSC

## 2019-01-21 NOTE — [place] FL2 (Signed)
[place] MEDICAID FL2 LEVEL OF CARE SCREENING TOOL     IDENTIFICATION  Patient Name: Danielle ContesJanice M Aho Birthdate: [birth year] Sex: female Admission Date (Current Location): 01/17/2019  [facility] [place]   Facility and Address:         Provider Number: [id number]  Attending Physician Name and Address:  Sherren KernsFields, Charles E, MD  Relative Name and Phone Number:  Marikay AlarGreg Richardson [phone]    Current Level of Care: Hospital Recommended Level of Care: Skilled Nursing Facility Prior Approval Number:    Date Approved/Denied:   PASRR Number:  [id number]   Discharge Plan: SNF    Current Diagnoses: Patient Active Problem List   Diagnosis Date Noted  . Gangrene of right foot (HCC) 01/17/2019  . History of transmetatarsal amputation of right foot (HCC) 12/13/2018  . At risk for adverse drug event 11/27/2018  . Osteomyelitis of third toe of right foot (HCC) 11/12/2018  . Pain in finger of right hand 02/20/2018  . Atherosclerosis of native arteries of the extremities with gangrene (HCC) 11/01/2017  . Unilateral AKA, left (HCC)   . Post-operative pain   . OSA (obstructive sleep apnea)   . Benign essential HTN   . DM (diabetes mellitus), secondary, uncontrolled, with peripheral vascular complications (HCC)   . Hyperkalemia   . Leukocytosis   . Acute blood loss anemia   . Amputation stump infection (HCC) 08/23/2017  . Amputation of left lower extremity above knee upon examination (HCC) 08/23/2017  . PAD (peripheral artery disease) (HCC) 07/23/2017  . Cellulitis 06/10/2017  . Hyponatremia 06/10/2017  . Fever 06/10/2017  . Rash 05/24/2017  . HTN (hypertension)   . Allergic rhinitis   . Insomnia   . Depression     Orientation RESPIRATION BLADDER Height & Weight     Self, Time, Situation, Place  O2(nasal cannula(2l/min)) Incontinent, External catheter Weight: 203 lb 14.8 oz (92.5 kg) Height:  5\' 2"  (157.5 cm)  BEHAVIORAL SYMPTOMS/MOOD NEUROLOGICAL  BOWEL NUTRITION STATUS      Incontinent Diet(please see discharge summary )  AMBULATORY STATUS COMMUNICATION OF NEEDS Skin   Limited Assist Verbally Surgical wounds(incision(closed) leg, right )                       Personal Care Assistance Level of Assistance  Bathing, Feeding, Dressing Bathing Assistance: Limited assistance Feeding assistance: Independent Dressing Assistance: Limited assistance     Functional Limitations Info  Sight, Hearing, Speech Sight Info: Adequate Hearing Info: Adequate Speech Info: Adequate    SPECIAL CARE FACTORS FREQUENCY  PT (By licensed PT), OT (By licensed OT)     PT Frequency: 5x per week  OT Frequency: 5x per week            Contractures Contractures Info: Not present    Additional Factors Info  Code Status, Allergies, Insulin Sliding Scale Code Status Info: FULL Allergies Info: Metformin And Related,Milk-related Compounds, Doxycycline   Insulin Sliding Scale Info: please see discharge summary        Current Medications (01/21/2019):  This is the current hospital active medication list Current Facility-Administered Medications  Medication Dose Route Frequency Provider Last Rate Last Dose  . 0.9 %  sodium chloride infusion  250 mL Intravenous PRN Clinton Gallantollins, Emma M, PA-C      . 0.9 %  sodium chloride infusion  250 mL Intravenous PRN Clinton Gallantollins, Emma M, PA-C      . acetaminophen (TYLENOL) tablet 325-650 mg  325-650 mg Oral Q4H PRN  Clinton Gallant M, PA-C       Or  . acetaminophen (TYLENOL) suppository 325-650 mg  325-650 mg Rectal Q4H PRN Lars Mage, PA-C      . alum & mag hydroxide-simeth (MAALOX/MYLANTA) 200-200-20 MG/5ML suspension 15-30 mL  15-30 mL Oral Q2H PRN Clinton Gallant M, PA-C      . amitriptyline (ELAVIL) tablet 25 mg  25 mg Oral QHS Clinton Gallant M, PA-C   25 mg at 01/20/19 2050  . aspirin EC tablet 81 mg  81 mg Oral Daily Clinton Gallant M, PA-C   81 mg at 01/21/19 0848  . collagenase (SANTYL) ointment 1 application  1  application Topical Daily Lars Mage, PA-C   1 application at 01/18/19 1415  . docusate sodium (COLACE) capsule 100 mg  100 mg Oral BID Clinton Gallant M, PA-C   100 mg at 01/21/19 0850  . gabapentin (NEURONTIN) capsule 300 mg  300 mg Oral QHS Clinton Gallant M, PA-C   300 mg at 01/20/19 2053  . guaiFENesin-dextromethorphan (ROBITUSSIN DM) 100-10 MG/5ML syrup 15 mL  15 mL Oral Q4H PRN Clinton Gallant M, PA-C      . heparin injection 5,000 Units  5,000 Units Subcutaneous Q8H Clinton Gallant M, PA-C   5,000 Units at 01/21/19 1459  . hydrALAZINE (APRESOLINE) injection 5 mg  5 mg Intravenous Q20 Min PRN Clinton Gallant M, PA-C      . insulin aspart (novoLOG) injection 0-20 Units  0-20 Units Subcutaneous TID WC Lars Mage, PA-C   3 Units at 01/21/19 1300  . insulin aspart protamine- aspart (NOVOLOG MIX 70/30) injection 60 Units  60 Units Subcutaneous Q supper Lars Mage, PA-C   60 Units at 01/19/19 1842  . insulin aspart protamine- aspart (NOVOLOG MIX 70/30) injection 70 Units  70 Units Subcutaneous Q breakfast Lars Mage, [place]   70 Units at 01/21/19 0847  . ketorolac (TORADOL) 30 MG/ML injection 30 mg  30 mg Intravenous Q8H Nada Libman, MD   30 mg at 01/21/19 1500  . labetalol (NORMODYNE,TRANDATE) injection 10 mg  10 mg Intravenous Q10 min PRN Clinton Gallant M, PA-C      . lactated ringers infusion   Intravenous Continuous Lars Mage, PA-C 10 mL/hr at 01/19/19 0957    . LORazepam (ATIVAN) tablet 0.5 mg  0.5 mg Oral BID PRN Clinton Gallant M, PA-C      . losartan (COZAAR) tablet 100 mg  100 mg Oral Daily Clinton Gallant M, PA-C   100 mg at 01/21/19 0848  . magnesium sulfate IVPB 2 g 50 mL  2 g Intravenous Daily PRN Clinton Gallant M, PA-C      . metFORMIN (GLUCOPHAGE-XR) 24 hr tablet 500 mg  500 mg Oral Q breakfast Clinton Gallant M, PA-C      . metoprolol tartrate (LOPRESSOR) injection 2-5 mg  2-5 mg Intravenous Q2H PRN Clinton Gallant M, PA-C      . morphine 2 MG/ML injection 2-5 mg  2-5 mg  Intravenous Q1H PRN Clinton Gallant M, PA-C   4 mg at 01/21/19 1118  . multivitamin with minerals tablet 1 tablet  1 tablet Per Tube Daily Lars Mage, PA-C   1 tablet at 01/21/19 0849  . mupirocin ointment (BACTROBAN) 2 % 1 application  1 application Nasal BID Lars Mage, PA-C   1 application at 01/18/19 1050  . nicotine polacrilex (NICORETTE) gum 2 mg  2 mg Oral Daily PRN Lars Mage, PA-C  2 mg at 01/20/19 1012  . ondansetron (ZOFRAN) injection 4 mg  4 mg Intravenous Q6H PRN Clinton Gallant M, PA-C      . oxyCODONE (Oxy IR/ROXICODONE) immediate release tablet 5-10 mg  5-10 mg Oral Q4H PRN Lars Mage, PA-C   10 mg at 01/21/19 9147  . pantoprazole (PROTONIX) EC tablet 40 mg  40 mg Oral Daily Clinton Gallant M, PA-C   40 mg at 01/21/19 0849  . phenol (CHLORASEPTIC) mouth spray 1 spray  1 spray Mouth/Throat PRN Clinton Gallant M, PA-C      . piperacillin-tazobactam (ZOSYN) IVPB 3.375 g  3.375 g Intravenous Q8H Clinton Gallant M, PA-C 12.5 mL/hr at 01/21/19 1510 3.375 g at 01/21/19 1510  . polyethylene glycol (MIRALAX / GLYCOLAX) packet 17 g  17 g Oral Daily PRN Clinton Gallant M, PA-C      . potassium chloride SA (K-DUR,KLOR-CON) CR tablet 20-40 mEq  20-40 mEq Oral Once Clinton Gallant M, PA-C      . potassium chloride SA (K-DUR,KLOR-CON) CR tablet 20-40 mEq  20-40 mEq Oral Daily PRN Clinton Gallant M, PA-C      . pravastatin (PRAVACHOL) tablet 40 mg  40 mg Oral Daily Clinton Gallant M, PA-C   40 mg at 01/21/19 0849  . sertraline (ZOLOFT) tablet 100 mg  100 mg Oral Daily Clinton Gallant M, PA-C   100 mg at 01/21/19 0849  . sodium chloride flush (NS) 0.9 % injection 3 mL  3 mL Intravenous Q12H Clinton Gallant M, PA-C   3 mL at 01/21/19 8295  . sodium chloride flush (NS) 0.9 % injection 3 mL  3 mL Intravenous PRN Clinton Gallant M, PA-C      . sodium chloride flush (NS) 0.9 % injection 3 mL  3 mL Intravenous Q12H Clinton Gallant M, PA-C   3 mL at 01/20/19 2054  . sodium chloride flush (NS) 0.9 % injection 3  mL  3 mL Intravenous PRN Clinton Gallant M, PA-C      . traZODone (DESYREL) tablet 50 mg  50 mg Oral QHS Clinton Gallant M, PA-C   50 mg at 01/20/19 2052  . zinc sulfate capsule 220 mg  220 mg Oral Daily Clinton Gallant M, PA-C   220 mg at 01/21/19 6213     Discharge Medications: Please see discharge summary for a list of discharge medications.  Relevant Imaging Results:  Relevant Lab Results:   Additional Information SSN # [id number], [place]

## 2019-01-22 LAB — GLUCOSE, CAPILLARY
Glucose-Capillary: 268 mg/dL — ABNORMAL HIGH (ref 70–99)
Glucose-Capillary: 204 mg/dL — ABNORMAL HIGH (ref 70–99)
Glucose-Capillary: 166 mg/dL — ABNORMAL HIGH (ref 70–99)
Glucose-Capillary: 253 mg/dL — ABNORMAL HIGH (ref 70–99)
Glucose-Capillary: 181 mg/dL — ABNORMAL HIGH (ref 70–99)

## 2019-01-22 MED ORDER — AMITRIPTYLINE HCL 25 MG PO TABS: 100.0000 mg | ORAL_TABLET | Freq: Every day | ORAL | Status: DC

## 2019-01-22 MED ORDER — TRAMADOL HCL 50 MG PO TABS: 50.0000 mg | ORAL_TABLET | Freq: Four times a day (QID) | ORAL | 0 refills | Status: DC | PRN

## 2019-01-22 NOTE — Plan of Care (Signed)
Care plan has been reviewed:  Problem: Pain Managment: Goal: General experience of comfort will improve Outcome: Progressing  Problem: Clinical Measurements: Goal: Cardiovascular complication will be avoided Outcome: Progressing    Problem: Clinical Measurements: Goal: Will remain free from infection Outcome: Progressing   Problem: Elimination: Goal: Will not experience complications related to bowel motility Outcome: Progressing  Problem: Activity: Goal: Risk for activity intolerance will decrease Outcome: Progressing  Danielle Buchanan, BSN,RN,PCCN-CMC,CSC

## 2019-01-22 NOTE — Progress Notes (Signed)
Occupational Therapy Treatment Patient Details Name: Danielle ContesJanice M Buchanan MRN: [id number] DOB: Aug 07, 1956 Today's Date: 01/22/2019    History of present illness 63 y.o. female, s/p PTA of TPT and peroneal artery by Dr Myra GianottiBrabham 2/20.  She subsequently had an open TMA and has been doing local wound care. PMH: L AKA,depression, sleep apnea, dyslipidemia and CAD. s/p R BKA 01/19/19   OT comments  Pt reports being faithful to her UE exercise program with level 3 theraband since visit yesterday. Transferred bed to chair using lateral scoot technique, but requiring moderate assistance without transfer board. Pt is highly motivated to return to her PLOF, which is functioning modified independently from a w/c level, which is very realistic with intensive rehab. Recommending CIR consult.   Follow Up Recommendations  CIR    Equipment Recommendations  Other (comment)(drop arm commode)    Recommendations for Other Services      Precautions / Restrictions Precautions Precautions: Fall Precaution Comments: now bilateral amputee with change to CoG causing increased posterior lean Restrictions RLE Weight Bearing: Non weight bearing LLE Weight Bearing: Non weight bearing       Mobility Bed Mobility Overal bed mobility: Needs Assistance Bed Mobility: Supine to Sit     Supine to sit: Mod assist     General bed mobility comments: mod assist to raise trunk  Transfers Overall transfer level: Needs assistance Equipment used: None Transfers: Lateral/Scoot Transfers          Lateral/Scoot Transfers: Mod assist General transfer comment: pt somewhat inefficient, did not use transfer board this visit therefore requiring more assistance with use of bed pad    Balance Overall balance assessment: Needs assistance   Sitting balance-Leahy Scale: Fair Sitting balance - Comments: cannot accept challenges to static sitting balance Postural control: Posterior lean                                  ADL either performed or assessed with clinical judgement   ADL Overall ADL's : Needs assistance/impaired     Grooming: Set up;Sitting;Wash/dry hands;Wash/dry face;Oral care                                       Vision       Perception     Praxis      Cognition Arousal/Alertness: Awake/alert Behavior During Therapy: WFL for tasks assessed/performed Overall Cognitive Status: Within Functional Limits for tasks assessed                                          Exercises     Shoulder Instructions       General Comments      Pertinent Vitals/ Pain       Pain Assessment: Faces Faces Pain Scale: Hurts even more Pain Location: R LE residual limb,  Pain Descriptors / Indicators: Sore Pain Intervention(s): Monitored during session;Repositioned;Premedicated before session  Home Living                                          Prior Functioning/Environment              Frequency  Min 2X/week        Progress Toward Goals  OT Goals(current goals can now be found in the care plan section)  Progress towards OT goals: Progressing toward goals  Acute Rehab OT Goals Patient Stated Goal: go home OT Goal Formulation: With patient Time For Goal Achievement: 02/03/19 Potential to Achieve Goals: Good  Plan Discharge plan needs to be updated    Co-evaluation                 AM-PAC OT "6 Clicks" Daily Activity     Outcome Measure   Help from another person eating meals?: None Help from another person taking care of personal grooming?: A Little Help from another person toileting, which includes using toliet, bedpan, or urinal?: A Lot Help from another person bathing (including washing, rinsing, drying)?: A Lot Help from another person to put on and taking off regular upper body clothing?: A Little Help from another person to put on and taking off regular lower body clothing?: A Lot 6 Click Score:  16    End of Session    OT Visit Diagnosis: Muscle weakness (generalized) (M62.81);Other symptoms and signs involving cognitive function;Pain Pain - Right/Left: Right Pain - part of body: Leg   Activity Tolerance Patient tolerated treatment well   Patient Left in chair;with call bell/phone within reach   Nurse Communication Mobility status        Time: 1761-6073 OT Time Calculation (min): 15 min  Charges: OT General Charges $OT Visit: 1 Visit OT Treatments $Self Care/Home Management : 8-22 mins  Martie Round, OTR/L Acute Rehabilitation Services Pager: [phone] Office: [phone]   Evern Bio 01/22/2019, 10:15 AM

## 2019-01-22 NOTE — Discharge Summary (Signed)
Discharge Summary    Danielle Buchanan [birth year] 63 y.o. female  [id number]  Admission Date: 01/17/2019  Discharge Date: 01/23/2019  Physician: Sherren Kerns, MD  Admission Diagnosis: Gangrene of right foot [facility]) [I96]   HPI:   This is a 63 y.o. female s/p PTA of TPT and peroneal artery by Dr Myra Gianotti 2/20.  She subsequently had an open TMA and has been doing local wound care.  She was sent to ER today with deterioration of wound and foul smell.  Her glucose has also been elevated.  She does not really complain of pain.  She has no fever or chills. Other medical problems include depression, sleep apnea, dyslipidemia which have been stable.  Hospital Course:  The patient was admitted to the hospital and taken to the operating room on 01/19/2019 and underwent: Right BKA    Findings:  findings: #1 calcified vessels with reasonably well perfused muscle tissue  The pt tolerated the procedure well and was transported to the PACU in good condition.   PT/OT evaluated pt and felt she was candidate for SNF.    On POD 2, her dressing was removed and incision looked good.  A stump sock was ordered.    On POD 3, she is doing well.  Stump is viable and healing well  The remainder of the hospital course consisted of increasing mobilization and increasing intake of solids without difficulty.  She will discharged and be admitted to CIR today in stable condition.  CBC    Component Value Date/Time   WBC 7.6 01/21/2019 0436   RBC 4.13 01/21/2019 0436   HGB 11.0 (L) 01/21/2019 0436   HCT 35.2 (L) 01/21/2019 0436   PLT 223 01/21/2019 0436   MCV 85.2 01/21/2019 0436   MCH 26.6 01/21/2019 0436   MCHC 31.3 01/21/2019 0436   RDW 13.4 01/21/2019 0436   LYMPHSABS 1.2 01/17/2019 1240   MONOABS 0.5 01/17/2019 1240   EOSABS 0.2 01/17/2019 1240   BASOSABS 0.1 01/17/2019 1240    BMET    Component Value Date/Time   NA 134 (L) 01/21/2019 0436   K 3.6 01/21/2019 0436   CL 96 (L) 01/21/2019  0436   CO2 26 01/21/2019 0436   GLUCOSE 178 (H) 01/21/2019 0436   BUN 14 01/21/2019 0436   CREATININE 0.84 01/21/2019 0436   CALCIUM 8.9 01/21/2019 0436   GFRNONAA >60 01/21/2019 0436   GFRAA >60 01/21/2019 0436      Discharge Instructions    Call MD for:  redness, tenderness, or signs of infection (pain, swelling, bleeding, redness, odor or green/yellow discharge around incision site)   Complete by:  As directed    Call MD for:  severe or increased pain, loss or decreased feeling  in affected limb(s)   Complete by:  As directed    Call MD for:  temperature >100.5   Complete by:  As directed    Discharge wound care:   Complete by:  As directed    Shower daily with soap and water starting 01/24/2019   Lifting restrictions   Complete by:  As directed    No heavy lifting for 4 weeks   Resume previous diet   Complete by:  As directed       Discharge Diagnosis:  Gangrene of right foot (HCC) [I96]  Secondary Diagnosis: Patient Active Problem List   Diagnosis Date Noted  . Gangrene of right foot (HCC) 01/17/2019  . History of transmetatarsal amputation of right foot (HCC) 12/13/2018  .  At risk for adverse drug event 11/27/2018  . Osteomyelitis of third toe of right foot (HCC) 11/12/2018  . Pain in finger of right hand 02/20/2018  . Atherosclerosis of native arteries of the extremities with gangrene (HCC) 11/01/2017  . Unilateral AKA, left (HCC)   . Post-operative pain   . OSA (obstructive sleep apnea)   . Benign essential HTN   . DM (diabetes mellitus), secondary, uncontrolled, with peripheral vascular complications (HCC)   . Hyperkalemia   . Leukocytosis   . Acute blood loss anemia   . Amputation stump infection (HCC) 08/23/2017  . Amputation of left lower extremity above knee upon examination (HCC) 08/23/2017  . PAD (peripheral artery disease) (HCC) 07/23/2017  . Cellulitis 06/10/2017  . Hyponatremia 06/10/2017  . Fever 06/10/2017  . Rash 05/24/2017  . HTN  (hypertension)   . Allergic rhinitis   . Insomnia   . Depression    Past Medical History:  Diagnosis Date  . Allergic rhinitis   . Depression   . Diabetes (HCC)   . Dyslipidemia   . HTN (hypertension)   . Insomnia   . OSA (obstructive sleep apnea)    no cpap  . Urinary incontinence      Allergies as of 01/23/2019      Reactions   Metformin And Related Diarrhea   Milk-related Compounds Diarrhea, Other (See Comments)   incontinence   Doxycycline Rash      Medication List    STOP taking these medications   clopidogrel 75 MG tablet Commonly known as:  PLAVIX   collagenase ointment Commonly known as:  SANTYL   oxyCODONE-acetaminophen 5-325 MG tablet Commonly known as:  PERCOCET/ROXICET     TAKE these medications   amitriptyline 25 MG tablet Commonly known as:  ELAVIL Take 4 tablets (100 mg total) by mouth at bedtime.   aspirin EC 325 MG tablet Take 325 mg by mouth daily. What changed:  Another medication with the same name was removed. Continue taking this medication, and follow the directions you see here.   gabapentin 300 MG capsule Commonly known as:  NEURONTIN Take 300 mg by mouth at bedtime.   Insulin Isophane & Regular Human (70-30) 100 UNIT/ML PEN Commonly known as:  HumuLIN 70/30 KwikPen Inject 70 Units into the skin 2 (two) times daily. Inject 70 units with breakfast and 60 units with dinner. Hold for CBG less than 60. Notify MD if CBG greater than 300 (DM) What changed:    how much to take  when to take this  additional instructions   LORazepam 0.5 MG tablet Commonly known as:  ATIVAN Take 1 tablet (0.5 mg total) by mouth 2 (two) times daily as needed for anxiety. What changed:  reasons to take this   losartan 100 MG tablet Commonly known as:  COZAAR Take 1 tablet (100 mg total) by mouth daily.   multivitamin with minerals Tabs tablet Place 1 tablet into feeding tube daily. What changed:  how to take this   nicotine polacrilex 2 MG gum  Commonly known as:  Nicorelief Take 1 each (2 mg total) by mouth daily as needed for smoking cessation. What changed:  when to take this   polyethylene glycol 17 g packet Commonly known as:  MIRALAX / GLYCOLAX Take 17 g by mouth daily as needed for moderate constipation.   pravastatin 40 MG tablet Commonly known as:  PRAVACHOL Take 40 mg by mouth daily. What changed:  Another medication with the same name was removed. Continue  taking this medication, and follow the directions you see here.   senna-docusate 8.6-50 MG tablet Commonly known as:  Senokot-S Take 1 tablet by mouth 2 (two) times daily.   sertraline 100 MG tablet Commonly known as:  ZOLOFT Take 1 tablet (100 mg total) by mouth daily.   traMADol 50 MG tablet Commonly known as:  Ultram Take 1 tablet (50 mg total) by mouth every 6 (six) hours as needed (pain).   traZODone 50 MG tablet Commonly known as:  DESYREL Take 1 tablet (50 mg total) by mouth at bedtime.   True Metrix Blood Glucose Test test strip Generic drug:  glucose blood USE TO CHECK YOUR BLOOD SUGAR TWICE A DAY (DX  E11.21) IN VITRO 90 DAYS   Vitamin E 180 MG Caps Take 180 mg by mouth daily.   Zinc 50 MG Tabs Take 1 tablet (50 mg total) by mouth daily.            Discharge Care Instructions  (From admission, onward)         Start     Ordered   01/22/19 0000  Discharge wound care:    Comments:  Shower daily with soap and water starting 01/24/2019   01/22/19 0800          Prescriptions given: Roxicet #20 No Refill  Instructions: 1.  No heavy lifting x 4 weeks 2.  Shower daily starting 01/24/2019 3.  Call for fever greater than 100.5 4.  Call MD for redness, tenderness, or signs of infection (pain, swelling bleeding, redness, odor, or green/yellow discharge around incision site).  5.  Call for severe or increased pain, loss or decrease feeling in affected limb.  Disposition: SNF  Patient's condition: is Good  Follow up: 1. Dr.  Darrick Penna in 4 weeks   Doreatha Massed, PA-C [facility] [phone] 01/23/2019  11:01 AM

## 2019-01-22 NOTE — Progress Notes (Addendum)
  Progress Note    01/22/2019 7:41 AM 3 Days Post-Op  Subjective:  Sleeping-awakes easily  Afebrile HR 50's-70's NSR 110's-120's systolic 100% 2LO2NC  Vitals:   01/21/19 2351 01/22/19 0327  BP:  126/60  Pulse:  68  Resp: 14 16  Temp:  97.8 F (36.6 C)  SpO2: 99% 99%    Physical Exam: Incisions:  Stump sock in place and is clean and dry   CBC    Component Value Date/Time   WBC 7.6 01/21/2019 0436   RBC 4.13 01/21/2019 0436   HGB 11.0 (L) 01/21/2019 0436   HCT 35.2 (L) 01/21/2019 0436   PLT 223 01/21/2019 0436   MCV 85.2 01/21/2019 0436   MCH 26.6 01/21/2019 0436   MCHC 31.3 01/21/2019 0436   RDW 13.4 01/21/2019 0436   LYMPHSABS 1.2 01/17/2019 1240   MONOABS 0.5 01/17/2019 1240   EOSABS 0.2 01/17/2019 1240   BASOSABS 0.1 01/17/2019 1240    BMET    Component Value Date/Time   NA 134 (L) 01/21/2019 0436   K 3.6 01/21/2019 0436   CL 96 (L) 01/21/2019 0436   CO2 26 01/21/2019 0436   GLUCOSE 178 (H) 01/21/2019 0436   BUN 14 01/21/2019 0436   CREATININE 0.84 01/21/2019 0436   CALCIUM 8.9 01/21/2019 0436   GFRNONAA >60 01/21/2019 0436   GFRAA >60 01/21/2019 0436    INR    Component Value Date/Time   INR 1.08 11/19/2018 0444     Intake/Output Summary (Last 24 hours) at 01/22/2019 0741 Last data filed at 01/22/2019 0500 Gross per 24 hour  Intake 1318.81 ml  Output 1000 ml  Net 318.81 ml     Assessment/Plan:  63 y.o. female is s/p right below knee amputation  3 Days Post-Op  -pt with stump sock in place -for SNF when bed available  Doreatha Massed, PA-C [facility] [phone] 01/22/2019 7:41 AM  I have interviewed the patient and examined the patient. I agree with the findings by the PA. I inspected the BKA which looks fine.   Cari Caraway, MD [phone]

## 2019-01-22 NOTE — Progress Notes (Signed)
Inpatient Rehab Admissions:  Inpatient Rehab Consult received.  I met with patient at the bedside for rehabilitation assessment and to discuss goals and expectations of an inpatient rehab admission.  She is very interested in [facility] program.  I have spoken with her nephew, [name], who is willing and able to arrange 24/7 help at d/c if needed.  I will open up a case with her insurance for prior authorization today for possible admission tomorrow, pending approval.   Signed: Shann Medal, PT, DPT Admissions Coordinator [phone] 01/22/19  10:22 AM

## 2019-01-23 ENCOUNTER — Telehealth: Payer: Self-pay | Admitting: Vascular Surgery

## 2019-01-23 ENCOUNTER — Inpatient Hospital Stay (HOSPITAL_COMMUNITY)
Admission: RE | Admit: 2019-01-23 | Discharge: 2019-02-06 | DRG: 561 | Disposition: A | Discharge status: Home Health | Payer: Medicare HMO | Source: Intra-hospital | Attending: Physical Medicine & Rehabilitation | Admitting: Physical Medicine & Rehabilitation

## 2019-01-23 ENCOUNTER — Other Ambulatory Visit: Payer: Self-pay

## 2019-01-23 ENCOUNTER — Encounter (HOSPITAL_COMMUNITY): Payer: Self-pay | Admitting: Nurse Practitioner

## 2019-01-23 DIAGNOSIS — Z7982 Long term (current) use of aspirin: Secondary | ICD-10-CM | POA: Diagnosis not present

## 2019-01-23 DIAGNOSIS — I2510 Atherosclerotic heart disease of native coronary artery without angina pectoris: Secondary | ICD-10-CM | POA: Diagnosis present

## 2019-01-23 DIAGNOSIS — Z888 Allergy status to other drugs, medicaments and biological substances status: Secondary | ICD-10-CM | POA: Diagnosis not present

## 2019-01-23 DIAGNOSIS — Z8349 Family history of other endocrine, nutritional and metabolic diseases: Secondary | ICD-10-CM

## 2019-01-23 DIAGNOSIS — Z7902 Long term (current) use of antithrombotics/antiplatelets: Secondary | ICD-10-CM | POA: Diagnosis not present

## 2019-01-23 DIAGNOSIS — Z79891 Long term (current) use of opiate analgesic: Secondary | ICD-10-CM | POA: Diagnosis not present

## 2019-01-23 DIAGNOSIS — Z811 Family history of alcohol abuse and dependence: Secondary | ICD-10-CM

## 2019-01-23 DIAGNOSIS — Z89612 Acquired absence of left leg above knee: Secondary | ICD-10-CM | POA: Diagnosis not present

## 2019-01-23 DIAGNOSIS — G4700 Insomnia, unspecified: Secondary | ICD-10-CM | POA: Diagnosis present

## 2019-01-23 DIAGNOSIS — E669 Obesity, unspecified: Secondary | ICD-10-CM | POA: Diagnosis present

## 2019-01-23 DIAGNOSIS — Z881 Allergy status to other antibiotic agents status: Secondary | ICD-10-CM

## 2019-01-23 DIAGNOSIS — E1169 Type 2 diabetes mellitus with other specified complication: Secondary | ICD-10-CM | POA: Diagnosis not present

## 2019-01-23 DIAGNOSIS — I739 Peripheral vascular disease, unspecified: Secondary | ICD-10-CM

## 2019-01-23 DIAGNOSIS — Z87891 Personal history of nicotine dependence: Secondary | ICD-10-CM | POA: Diagnosis not present

## 2019-01-23 DIAGNOSIS — Z794 Long term (current) use of insulin: Secondary | ICD-10-CM | POA: Diagnosis not present

## 2019-01-23 DIAGNOSIS — Z89511 Acquired absence of right leg below knee: Secondary | ICD-10-CM | POA: Diagnosis not present

## 2019-01-23 DIAGNOSIS — G4733 Obstructive sleep apnea (adult) (pediatric): Secondary | ICD-10-CM | POA: Diagnosis present

## 2019-01-23 DIAGNOSIS — F329 Major depressive disorder, single episode, unspecified: Secondary | ICD-10-CM | POA: Diagnosis present

## 2019-01-23 DIAGNOSIS — Z8249 Family history of ischemic heart disease and other diseases of the circulatory system: Secondary | ICD-10-CM | POA: Diagnosis not present

## 2019-01-23 DIAGNOSIS — E1142 Type 2 diabetes mellitus with diabetic polyneuropathy: Secondary | ICD-10-CM | POA: Diagnosis present

## 2019-01-23 DIAGNOSIS — E1151 Type 2 diabetes mellitus with diabetic peripheral angiopathy without gangrene: Secondary | ICD-10-CM | POA: Diagnosis not present

## 2019-01-23 DIAGNOSIS — Z716 Tobacco abuse counseling: Secondary | ICD-10-CM

## 2019-01-23 DIAGNOSIS — E785 Hyperlipidemia, unspecified: Secondary | ICD-10-CM | POA: Diagnosis present

## 2019-01-23 DIAGNOSIS — K5900 Constipation, unspecified: Secondary | ICD-10-CM | POA: Diagnosis not present

## 2019-01-23 DIAGNOSIS — Z6836 Body mass index (BMI) 36.0-36.9, adult: Secondary | ICD-10-CM

## 2019-01-23 DIAGNOSIS — Z4781 Encounter for orthopedic aftercare following surgical amputation: Secondary | ICD-10-CM | POA: Diagnosis not present

## 2019-01-23 DIAGNOSIS — I10 Essential (primary) hypertension: Secondary | ICD-10-CM

## 2019-01-23 DIAGNOSIS — E739 Lactose intolerance, unspecified: Secondary | ICD-10-CM | POA: Diagnosis present

## 2019-01-23 DIAGNOSIS — Z79899 Other long term (current) drug therapy: Secondary | ICD-10-CM

## 2019-01-23 HISTORY — DX: Acquired absence of right leg below knee: Z89.511

## 2019-01-23 LAB — CBC
HCT: 32.9 % — ABNORMAL LOW (ref 36.0–46.0)
Hemoglobin: 11 g/dL — ABNORMAL LOW (ref 12.0–15.0)
MCH: 28.3 pg (ref 26.0–34.0)
MCHC: 33.4 g/dL (ref 30.0–36.0)
MCV: 84.6 fL (ref 80.0–100.0)
Platelets: 231 10*3/uL (ref 150–400)
RBC: 3.89 MIL/uL (ref 3.87–5.11)
RDW: 13.2 % (ref 11.5–15.5)
WBC: 6.8 10*3/uL (ref 4.0–10.5)
nRBC: 0 % (ref 0.0–0.2)

## 2019-01-23 LAB — GLUCOSE, CAPILLARY
Glucose-Capillary: 202 mg/dL — ABNORMAL HIGH (ref 70–99)
Glucose-Capillary: 157 mg/dL — ABNORMAL HIGH (ref 70–99)
Glucose-Capillary: 167 mg/dL — ABNORMAL HIGH (ref 70–99)
Glucose-Capillary: 121 mg/dL — ABNORMAL HIGH (ref 70–99)

## 2019-01-23 LAB — CREATININE, SERUM
Creatinine, Ser: 0.88 mg/dL (ref 0.44–1.00)
GFR calc Af Amer: >60 mL/min (ref >60)
GFR calc non Af Amer: >60 mL/min (ref >60)

## 2019-01-23 MED ORDER — DOCUSATE SODIUM 100 MG PO CAPS
100.0000 mg | ORAL_CAPSULE | Freq: Two times a day (BID) | ORAL | Status: DC
  Administered 2019-01-24 – 2019-02-05 (×9): 100 mg via ORAL
  Filled 2019-01-23 (×17): qty 1

## 2019-01-23 MED ORDER — LORAZEPAM 0.5 MG PO TABS
0.5000 mg | ORAL_TABLET | Freq: Two times a day (BID) | ORAL | Status: DC | PRN
  Administered 2019-01-30: 21:00:00 0.5 mg via ORAL
  Filled 2019-01-23: qty 1

## 2019-01-23 MED ORDER — HEPARIN SODIUM (PORCINE) 5000 UNIT/ML IJ SOLN: 5000.0000 [IU] | Freq: Three times a day (TID) | INTRAMUSCULAR | Status: DC

## 2019-01-23 MED ORDER — SERTRALINE HCL 100 MG PO TABS
100.0000 mg | ORAL_TABLET | Freq: Every day | ORAL | Status: DC
  Administered 2019-01-24 – 2019-02-06 (×14): 100 mg via ORAL
  Filled 2019-01-23 (×14): qty 1

## 2019-01-23 MED ORDER — AMITRIPTYLINE HCL 25 MG PO TABS
25.0000 mg | ORAL_TABLET | Freq: Every day | ORAL | Status: DC
  Administered 2019-01-23 – 2019-01-31 (×9): 25 mg via ORAL
  Filled 2019-01-23 (×9): qty 1

## 2019-01-23 MED ORDER — PANTOPRAZOLE SODIUM 40 MG PO TBEC
40.0000 mg | DELAYED_RELEASE_TABLET | Freq: Every day | ORAL | Status: DC
  Administered 2019-01-24 – 2019-02-06 (×14): 40 mg via ORAL
  Filled 2019-01-23 (×14): qty 1

## 2019-01-23 MED ORDER — SORBITOL 70 % SOLN: 30.0000 mL | Freq: Every day | Status: DC | PRN

## 2019-01-23 MED ORDER — HEPARIN SODIUM (PORCINE) 5000 UNIT/ML IJ SOLN
5000.0000 [IU] | Freq: Three times a day (TID) | INTRAMUSCULAR | Status: DC
  Administered 2019-01-23 – 2019-02-06 (×41): 5000 [IU] via SUBCUTANEOUS
  Filled 2019-01-23 (×41): qty 1

## 2019-01-23 MED ORDER — GABAPENTIN 300 MG PO CAPS
300.0000 mg | ORAL_CAPSULE | Freq: Every day | ORAL | Status: DC
  Administered 2019-01-23 – 2019-02-05 (×14): 300 mg via ORAL
  Filled 2019-01-23 (×14): qty 1

## 2019-01-23 MED ORDER — ADULT MULTIVITAMIN W/MINERALS CH
1.0000 | ORAL_TABLET | Freq: Every day | ORAL | Status: DC
  Administered 2019-01-24 – 2019-02-06 (×14): 1
  Filled 2019-01-23 (×14): qty 1

## 2019-01-23 MED ORDER — TRAZODONE HCL 50 MG PO TABS
50.0000 mg | ORAL_TABLET | Freq: Every day | ORAL | Status: DC
  Administered 2019-01-23 – 2019-02-05 (×14): 50 mg via ORAL
  Filled 2019-01-23 (×14): qty 1

## 2019-01-23 MED ORDER — METFORMIN HCL ER 500 MG PO TB24
500.0000 mg | ORAL_TABLET | Freq: Every day | ORAL | Status: DC
  Administered 2019-01-24 – 2019-01-25 (×2): 500 mg via ORAL
  Filled 2019-01-23 (×2): qty 1

## 2019-01-23 MED ORDER — ASPIRIN EC 81 MG PO TBEC
81.0000 mg | DELAYED_RELEASE_TABLET | Freq: Every day | ORAL | Status: DC
  Administered 2019-01-24 – 2019-02-06 (×14): 81 mg via ORAL
  Filled 2019-01-23 (×14): qty 1

## 2019-01-23 MED ORDER — ACETAMINOPHEN 650 MG RE SUPP: 325.0000 mg | RECTAL | Status: DC | PRN

## 2019-01-23 MED ORDER — PRAVASTATIN SODIUM 40 MG PO TABS
40.0000 mg | ORAL_TABLET | Freq: Every day | ORAL | Status: DC
  Administered 2019-01-24 – 2019-02-06 (×14): 40 mg via ORAL
  Filled 2019-01-23 (×14): qty 1

## 2019-01-23 MED ORDER — ALUM & MAG HYDROXIDE-SIMETH 200-200-20 MG/5ML PO SUSP: 15.0000 mL | ORAL | Status: DC | PRN

## 2019-01-23 MED ORDER — LOSARTAN POTASSIUM 50 MG PO TABS
100.0000 mg | ORAL_TABLET | Freq: Every day | ORAL | Status: DC
  Administered 2019-01-24 – 2019-02-06 (×14): 100 mg via ORAL
  Filled 2019-01-23 (×14): qty 2

## 2019-01-23 MED ORDER — COLLAGENASE 250 UNIT/GM EX OINT
1.0000 "application " | TOPICAL_OINTMENT | Freq: Every day | CUTANEOUS | Status: DC
  Filled 2019-01-23: qty 30

## 2019-01-23 MED ORDER — OXYCODONE HCL 5 MG PO TABS
5.0000 mg | ORAL_TABLET | ORAL | Status: DC | PRN
  Administered 2019-01-23 – 2019-01-27 (×8): 10 mg via ORAL
  Administered 2019-01-27: 5 mg via ORAL
  Administered 2019-01-27 – 2019-01-28 (×5): 10 mg via ORAL
  Administered 2019-01-29 (×3): 5 mg via ORAL
  Administered 2019-01-29 – 2019-01-30 (×2): 10 mg via ORAL
  Administered 2019-01-30: 5 mg via ORAL
  Administered 2019-01-30 – 2019-01-31 (×2): 10 mg via ORAL
  Administered 2019-02-02 (×3): 5 mg via ORAL
  Administered 2019-02-04: 15:00:00 10 mg via ORAL
  Administered 2019-02-05: 5 mg via ORAL
  Filled 2019-01-23 (×7): qty 2
  Filled 2019-01-23: qty 1
  Filled 2019-01-23: qty 2
  Filled 2019-01-23: qty 1
  Filled 2019-01-23 (×4): qty 2
  Filled 2019-01-23 (×2): qty 1
  Filled 2019-01-23 (×4): qty 2
  Filled 2019-01-23: qty 1
  Filled 2019-01-23 (×5): qty 2
  Filled 2019-01-23: qty 1
  Filled 2019-01-23: qty 2

## 2019-01-23 MED ORDER — INSULIN ASPART 100 UNIT/ML ~~LOC~~ SOLN
0.0000 [IU] | Freq: Three times a day (TID) | SUBCUTANEOUS | Status: DC
  Administered 2019-01-23 – 2019-01-25 (×4): 4 [IU] via SUBCUTANEOUS
  Administered 2019-01-26: 18:00:00 3 [IU] via SUBCUTANEOUS
  Administered 2019-01-27: 4 [IU] via SUBCUTANEOUS
  Administered 2019-01-27 – 2019-01-28 (×3): 3 [IU] via SUBCUTANEOUS
  Administered 2019-01-28 – 2019-01-29 (×3): 4 [IU] via SUBCUTANEOUS
  Administered 2019-01-30 (×2): 7 [IU] via SUBCUTANEOUS
  Administered 2019-01-31: 17:00:00 4 [IU] via SUBCUTANEOUS
  Administered 2019-01-31: 08:00:00 7 [IU] via SUBCUTANEOUS
  Administered 2019-01-31 – 2019-02-01 (×3): 4 [IU] via SUBCUTANEOUS
  Administered 2019-02-01: 18:00:00 7 [IU] via SUBCUTANEOUS
  Administered 2019-02-02 (×2): 4 [IU] via SUBCUTANEOUS
  Administered 2019-02-02 – 2019-02-03 (×2): 3 [IU] via SUBCUTANEOUS
  Administered 2019-02-03: 09:00:00 4 [IU] via SUBCUTANEOUS
  Administered 2019-02-03: 3 [IU] via SUBCUTANEOUS
  Administered 2019-02-04: 17:00:00 7 [IU] via SUBCUTANEOUS
  Administered 2019-02-04: 3 [IU] via SUBCUTANEOUS
  Administered 2019-02-04 – 2019-02-05 (×2): 4 [IU] via SUBCUTANEOUS
  Administered 2019-02-06: 3 [IU] via SUBCUTANEOUS

## 2019-01-23 MED ORDER — INSULIN ASPART PROT & ASPART (70-30 MIX) 100 UNIT/ML ~~LOC~~ SUSP
60.0000 [IU] | Freq: Every day | SUBCUTANEOUS | Status: DC
  Administered 2019-01-23 – 2019-01-29 (×7): 60 [IU] via SUBCUTANEOUS
  Filled 2019-01-23 (×2): qty 10

## 2019-01-23 MED ORDER — POLYETHYLENE GLYCOL 3350 17 G PO PACK: 17.0000 g | PACK | Freq: Every day | ORAL | Status: DC | PRN

## 2019-01-23 MED ORDER — ACETAMINOPHEN 325 MG PO TABS
325.0000 mg | ORAL_TABLET | ORAL | Status: DC | PRN
  Administered 2019-01-29 – 2019-02-04 (×6): 650 mg via ORAL
  Filled 2019-01-23 (×6): qty 2

## 2019-01-23 MED ORDER — NICOTINE POLACRILEX 2 MG MT GUM
2.0000 mg | CHEWING_GUM | Freq: Every day | OROMUCOSAL | Status: DC | PRN
  Filled 2019-01-23: qty 1

## 2019-01-23 MED ORDER — ZINC SULFATE 220 (50 ZN) MG PO CAPS
220.0000 mg | ORAL_CAPSULE | Freq: Every day | ORAL | Status: DC
  Administered 2019-01-24 – 2019-02-06 (×14): 220 mg via ORAL
  Filled 2019-01-23 (×14): qty 1

## 2019-01-23 MED ORDER — INSULIN ASPART PROT & ASPART (70-30 MIX) 100 UNIT/ML ~~LOC~~ SUSP
70.0000 [IU] | Freq: Every day | SUBCUTANEOUS | Status: DC
  Administered 2019-01-24 – 2019-02-03 (×11): 70 [IU] via SUBCUTANEOUS
  Filled 2019-01-23 (×2): qty 10

## 2019-01-23 NOTE — Progress Notes (Signed)
Patient ID: Danielle Buchanan, female   DOB: 07/17/1956, 63 y.o.   MRN: [id number] Patient admitted to [phone] via bed, escorted by nursing staff.  Patient oriented to unit, including fall prevention policy, visitation policy, and personal belongings policy.  Patient came with IV antibiotics infusing through IV in right forearm.  Incision to right BKA intact with staples.  No dressing noted, just loose stump sock in place.  Could benefit from compression wrapping due to shape of stump.  Appears to be in no immediate distress at this time.  [name], RN

## 2019-01-23 NOTE — Telephone Encounter (Signed)
sch appt lvm mld ltr 02/19/2019 9am p/o MD

## 2019-01-23 NOTE — H&P (Signed)
Physical Medicine and Rehabilitation Admission H&P        Chief Complaint  Patient presents with  . Post-op Problem  . R foot infection  Chief complaint:stump pain   HPI: Danielle Buchanan is a 63 year old right-handed female with history of type 2 diabetes mellitus,CAD,tobacco abuse,TBI with memory deficits hypertension, left AKA 2018 as well as recent right fourth toe amputation 11/11/2018 and had been discharged to skilled nursing facility before returning home. Patient lives alone in a handicapped accessible apartment and primarily uses a wheelchair prior to admission although she does have a prosthesis for left AKA with limited use after right foot infection. She has a niece and nephew who plans to provide assistance as needed on discharge. Presented 01/19/2019 with gangrenous right foot ischemic changes. Limb was not felt to be salvageable and underwent right BKA 01/19/2019 per Dr. Darrick Penna.Hospital course pain management. Stump sock has been applied.  Subcutaneous heparin for DVT prophylaxis. Blood sugars closely monitored maintained on insulin therapy. Therapy evaluations completed and patient was admitted for a comprehensive rehabilitation program.   Review of Systems  Constitutional: Negative for chills and fever.  HENT: Negative for hearing loss.   Eyes: Negative for blurred vision and double vision.  Respiratory: Negative for cough and shortness of breath.   Cardiovascular: Negative for chest pain and palpitations.  Gastrointestinal: Positive for constipation. Negative for heartburn, nausea and vomiting.  Genitourinary: Negative for dysuria, flank pain and hematuria.  Musculoskeletal: Positive for myalgias.  Skin: Negative for rash.  Psychiatric/Behavioral: Positive for depression and memory loss. The patient has insomnia.   All other systems reviewed and are negative.       Past Medical History:  Diagnosis Date  . Allergic rhinitis    . Depression    . Diabetes (HCC)     . Dyslipidemia    . HTN (hypertension)    . Insomnia    . OSA (obstructive sleep apnea)      no cpap  . Urinary incontinence           Past Surgical History:  Procedure Laterality Date  . ABDOMINAL AORTOGRAM W/LOWER EXTREMITY N/A 06/24/2017    Procedure: ABDOMINAL AORTOGRAM W/LOWER EXTREMITY;  Surgeon: Maeola Harman, MD;  Location: [facility] INVASIVE CV LAB;  Service: Cardiovascular;  Laterality: N/A;  . ABDOMINAL AORTOGRAM W/LOWER EXTREMITY Right 10/09/2017    Procedure: ABDOMINAL AORTOGRAM W/LOWER EXTREMITY;  Surgeon: Maeola Harman, MD;  Location: [facility] INVASIVE CV LAB;  Service: Cardiovascular;  Laterality: Right;  . AMPUTATION Left 07/23/2017    Procedure: AMPUTATION  BELOW KNEE;  Surgeon: Maeola Harman, MD;  Location: [facility] OR;  Service: Vascular;  Laterality: Left;  . AMPUTATION Left 08/12/2017    Procedure: REVISION BELOW KNEE;  Surgeon: Maeola Harman, MD;  Location: [facility] OR;  Service: Vascular;  Laterality: Left;  . AMPUTATION Left 08/27/2017    Procedure: left ABOVE KNEE AMPUTATION;  Surgeon: Maeola Harman, MD;  Location: [facility] OR;  Service: Vascular;  Laterality: Left;  . AMPUTATION Left 08/30/2017    Procedure: REVISION AMPUTATION ABOVE KNEE;  Surgeon: Nada Libman, MD;  Location: [facility] OR;  Service: Vascular;  Laterality: Left;  . AMPUTATION Right 11/04/2017    Procedure: AMPUTATION RIGHT FOURTH TOE;  Surgeon: Maeola Harman, MD;  Location: [facility] OR;  Service: Vascular;  Laterality: Right;  . AMPUTATION Right 11/12/2018    Procedure: AMPUTATION OF second, third and fifth toes;  Surgeon: Nada Libman, MD;  Location: [facility]  OR;  Service: Vascular;  Laterality: Right;  . AMPUTATION Right 01/19/2019    Procedure: AMPUTATION BELOW KNEE;  Surgeon: Sherren Kerns, MD;  Location: [facility] OR;  Service: Vascular;  Laterality: Right;  . APPLICATION OF WOUND VAC Left 08/27/2017    Procedure: APPLICATION OF WOUND VAC;  Surgeon: Maeola Harman, MD;  Location: [facility] OR;  Service: Vascular;  Laterality: Left;  . CARPAL TUNNEL RELEASE Right    . COLONOSCOPY      . LOWER EXTREMITY ANGIOGRAM Right 11/12/2018    Procedure: ANGIOGRAM OF Right LEG;  Surgeon: Nada Libman, MD;  Location: [facility] OR;  Service: Vascular;  Laterality: Right;  . LOWER EXTREMITY INTERVENTION Right 10/09/2017    Procedure: LOWER EXTREMITY INTERVENTION;  Surgeon: Maeola Harman, MD;  Location: [facility] INVASIVE CV LAB;  Service: Cardiovascular;  Laterality: Right;  . TOE AMPUTATION Right 11/12/2018    2ND 3RD & 5TH TOE   . TRANSMETATARSAL AMPUTATION Right 11/19/2018    Procedure: TRANSMETATARSAL AMPUTATION REVISION;  Surgeon: Nada Libman, MD;  Location: [facility] OR;  Service: Vascular;  Laterality: Right;         Family History  Problem Relation Age of Onset  . Heart attack Mother    . Alcohol abuse Mother    . Heart attack Father    . Alcohol abuse Father    . Heart attack Sister    . Heart attack Brother    . Hypercholesterolemia Sister    . Diabetes Neg Hx      Social History:  reports that she quit smoking about 5 months ago. Her smoking use included cigarettes. She has a 51.00 pack-year smoking history. She has never used smokeless tobacco. She reports that she does not drink alcohol or use drugs. Allergies:       Allergies  Allergen Reactions  . Metformin And Related Diarrhea  . Milk-Related Compounds Diarrhea and Other (See Comments)      incontinence  . Doxycycline Rash          Medications Prior to Admission  Medication Sig Dispense Refill  . aspirin EC 325 MG tablet Take 325 mg by mouth daily.      . collagenase (SANTYL) ointment Apply 1 application topically daily. 30 g 1  . gabapentin (NEURONTIN) 300 MG capsule Take 300 mg by mouth at bedtime.       . Insulin Isophane & Regular Human (HUMULIN 70/30 KWIKPEN) (70-30) 100 UNIT/ML PEN Inject 70 Units into the skin 2 (two) times daily. Inject 70 units with breakfast and 60 units with  dinner. Hold for CBG less than 60. Notify MD if CBG greater than 300 (DM) (Patient taking differently: Inject 90-100 Units into the skin See admin instructions. Inject 100 units subcutaneously with breakfast and 90 units with supper) 15 mL 0  . LORazepam (ATIVAN) 0.5 MG tablet Take 1 tablet (0.5 mg total) by mouth 2 (two) times daily as needed for anxiety. (Patient taking differently: Take 0.5 mg by mouth 2 (two) times daily as needed for anxiety or sleep. ) 15 tablet 0  . losartan (COZAAR) 100 MG tablet Take 1 tablet (100 mg total) by mouth daily. 30 tablet 0  . Multiple Vitamin (MULTIVITAMIN WITH MINERALS) TABS tablet Place 1 tablet into feeding tube daily. (Patient taking differently: Take 1 tablet by mouth daily. ) 30 tablet 0  . nicotine polacrilex (NICORELIEF) 2 MG gum Take 1 each (2 mg total) by mouth daily as needed for smoking cessation. (Patient taking  differently: Take 2 mg by mouth 4 (four) times daily as needed for smoking cessation. ) 30 tablet 0  . pravastatin (PRAVACHOL) 40 MG tablet Take 40 mg by mouth daily.      . sertraline (ZOLOFT) 100 MG tablet Take 1 tablet (100 mg total) by mouth daily. 30 tablet 0  . traZODone (DESYREL) 50 MG tablet Take 1 tablet (50 mg total) by mouth at bedtime. 30 tablet 0  . Vitamin E 180 MG CAPS Take 180 mg by mouth daily. 30 capsule 0  . Zinc 50 MG TABS Take 1 tablet (50 mg total) by mouth daily. 30 tablet 0  . [DISCONTINUED] amitriptyline (ELAVIL) 25 MG tablet Take 1 tablet (25 mg total) by mouth at bedtime. (Patient taking differently: Take 100 mg by mouth at bedtime. ) 30 tablet 0  . [DISCONTINUED] traMADol (ULTRAM) 50 MG tablet Take 1 tablet (50 mg total) by mouth every 6 (six) hours as needed. (Patient taking differently: Take 50 mg by mouth every 6 (six) hours as needed (pain). ) 30 tablet 0  . aspirin 81 MG EC tablet Take 1 tablet (81 mg total) by mouth daily. (Patient not taking: Reported on 01/18/2019) 30 tablet 0  . clopidogrel (PLAVIX) 75 MG  tablet Take 1 tablet (75 mg total) by mouth daily with breakfast. (Patient not taking: Reported on 01/17/2019) 30 tablet 0  . oxyCODONE-acetaminophen (PERCOCET/ROXICET) 5-325 MG tablet Take 1 tablet by mouth every 6 (six) hours as needed for severe pain. (Patient not taking: Reported on 01/18/2019) 30 tablet 0  . polyethylene glycol (MIRALAX / GLYCOLAX) packet Take 17 g by mouth daily as needed for moderate constipation. (Patient not taking: Reported on 01/18/2019) 14 each 0  . pravastatin (PRAVACHOL) 20 MG tablet Take 2 tablets (40 mg total) by mouth daily. (Patient not taking: Reported on 01/18/2019) 30 tablet 0  . senna-docusate (SENOKOT-S) 8.6-50 MG tablet Take 1 tablet by mouth 2 (two) times daily. (Patient not taking: Reported on 01/18/2019) 60 tablet 0  . TRUE METRIX BLOOD GLUCOSE TEST test strip USE TO CHECK YOUR BLOOD SUGAR TWICE A DAY (DX  E11.21) IN VITRO 90 DAYS          Drug Regimen Review Drug regimen was reviewed and remains appropriate with no significant issues identified   Home: Home Living Family/patient expects to be discharged to:: Private residence Living Arrangements: Alone Available Help at Discharge: Family, Available PRN/intermittently(niece and nephew help out ) Type of Home: Apartment Home Access: Level entry Home Layout: One level Bathroom Shower/Tub: Engineer, manufacturing systemsTub/shower unit Bathroom Toilet: Handicapped height Bathroom Accessibility: Yes Home Equipment: Tub bench, Grab bars - tub/shower, Hand held shower head, Wheelchair - manual   Functional History: Prior Function Level of Independence: Needs assistance Gait / Transfers Assistance Needed: mostly transferring independently to w/c utilizing R LE to mobilize with w/c. sometimes requires nephews help and able to transfer to/from tub bench  ADL's / Homemaking Assistance Needed: family does cleaning, niece and nephew take her to Dr's appointments and shopping   Functional Status:  Mobility: Bed Mobility Overal bed  mobility: Needs Assistance Bed Mobility: Supine to Sit Supine to sit: Mod assist General bed mobility comments: mod assist to raise trunk Transfers Overall transfer level: Needs assistance Equipment used: None Transfers: Lateral/Scoot Transfers Anterior-Posterior transfers: +2 physical assistance, Mod assist  Lateral/Scoot Transfers: Mod assist General transfer comment: pt somewhat inefficient, did not use transfer board this visit therefore requiring more assistance with use of bed pad   ADL: ADL Overall  ADL's : Needs assistance/impaired Eating/Feeding: Independent, Sitting Grooming: Set up, Sitting, Wash/dry hands, Wash/dry face, Oral care Upper Body Bathing: Minimal assistance, Sitting Lower Body Bathing: Moderate assistance, Bed level, Sitting/lateral leans Upper Body Dressing : Minimal assistance, Sitting Lower Body Dressing: Maximal assistance, Sitting/lateral leans, Bed level Toilet Transfer: Moderate assistance, +2 for physical assistance, +2 for safety/equipment Toilet Transfer Details (indicate cue type and reason): Mod A +2 for posterior transfer to recliner. Using bed pads to facilitate hips back into the chair. Cues for sequencing.  Functional mobility during ADLs: Moderate assistance, +2 for physical assistance, +2 for safety/equipment General ADL Comments: Worked at bed level on pulling trunk from semi reclined into sitting using B bed rails followed by sitting balance reaching within BoS in long sitting.   Cognition: Cognition Overall Cognitive Status: Within Functional Limits for tasks assessed Orientation Level: Oriented X4 Cognition Arousal/Alertness: Awake/alert Behavior During Therapy: WFL for tasks assessed/performed Overall Cognitive Status: Within Functional Limits for tasks assessed General Comments: Reports she had to have dog Mango put down in 09-18-23 and her sister died last 03-19-23, depressed    Physical Exam: Blood pressure (!) 162/76, pulse 67,  temperature 97.9 F (36.6 C), temperature source Axillary, resp. rate 14, height 5\' 2"  (1.575 m), weight 92.5 kg, SpO2 97 %. Physical Exam  Constitutional: She is oriented to person, place, and time. No distress.  obese  HENT:  Head: Normocephalic and atraumatic.  Eyes: Pupils are equal, round, and reactive to light. EOM are normal. Left eye exhibits no discharge.  Neck: Normal range of motion. No tracheal deviation present. No thyromegaly present.  Cardiovascular: Normal rate. Exam reveals no friction rub.  No murmur heard. Respiratory: Effort normal. No respiratory distress. She has no wheezes. She has no rales.  GI: Soft. She exhibits no distension. There is no abdominal tenderness.  Musculoskeletal:     Comments: Right BK very tender to palpation, edematous  Neurological: She is alert and oriented to person, place, and time. No cranial nerve deficit. Coordination normal.  Alert acute distress. Follows commands. Provides her name and age.Fair butLimited medical historian    Skin: Skin is warm.  AKA well-healed. Right BKA is CDI with staples.   Psychiatric: She has a normal mood and affect. Her behavior is normal. Judgment and thought content normal.      Lab Results Last 48 Hours       Results for orders placed or performed during the hospital encounter of 01/17/19 (from the past 48 hour(s))  Glucose, capillary     Status: Abnormal    Collection Time: 01/21/19 11:20 AM  Result Value Ref Range    Glucose-Capillary 124 (H) 70 - 99 mg/dL  Glucose, capillary     Status: Abnormal    Collection Time: 01/21/19  4:42 PM  Result Value Ref Range    Glucose-Capillary 188 (H) 70 - 99 mg/dL  Glucose, capillary     Status: Abnormal    Collection Time: 01/21/19  8:55 PM  Result Value Ref Range    Glucose-Capillary 220 (H) 70 - 99 mg/dL  Glucose, capillary     Status: Abnormal    Collection Time: 01/22/19  6:12 AM  Result Value Ref Range    Glucose-Capillary 268 (H) 70 - 99 mg/dL   Glucose, capillary     Status: Abnormal    Collection Time: 01/22/19  7:50 AM  Result Value Ref Range    Glucose-Capillary 204 (H) 70 - 99 mg/dL  Glucose, capillary  Status: Abnormal    Collection Time: 01/22/19 11:29 AM  Result Value Ref Range    Glucose-Capillary 166 (H) 70 - 99 mg/dL  Glucose, capillary     Status: Abnormal    Collection Time: 01/22/19  4:03 PM  Result Value Ref Range    Glucose-Capillary 253 (H) 70 - 99 mg/dL  Glucose, capillary     Status: Abnormal    Collection Time: 01/22/19  9:18 PM  Result Value Ref Range    Glucose-Capillary 181 (H) 70 - 99 mg/dL  Glucose, capillary     Status: Abnormal    Collection Time: 01/23/19  6:41 AM  Result Value Ref Range    Glucose-Capillary 202 (H) 70 - 99 mg/dL      Imaging Results (Last 48 hours)  No results found.           Medical Problem List and Plan: 1.  Decreased functional mobility secondary to right BKA 01/19/2019 as well as history of left AKA 2018             -admit to inpatient rehab 2.  Antithrombotics: -DVT/anticoagulation:  Subcutaneous heparin             -antiplatelet therapy: aspirin 81 mg daily 3. Pain Management: Elavil 25 mg daily at bedtime,Neurontin 300 mg daily at bedtime,oxycodone as needed             -discussed massage and desensitization for R BKA 4. Mood: trazodone 50 mg daily at bedtime, Zoloft 100 mg daily, Ativan 0.5 mg twice a day as needed             -antipsychotic agents: N/A 5. Neuropsych: This patient is capable of making decisions on her own behalf. 6. Skin/Wound Care:  Routine skin checks, stump sock             -apply stump shrinker when able to tolerate 7. Fluids/Electrolytes/Nutrition:  Routine ins and outs with follow-up chemistries 8. CAD. Continue aspirin therapy. No chest pain 9. Diabetes mellitus with peripheral neuropathy. Hemoglobin A1c 8.5.             -NovoLog 70/30 70 units at breakfast 60 units at supper             -Glucophage 500 mg daily.               -Check blood sugars before meals and at bedtime. Diabetic teaching 10. Hypertension. Cozaar 100 mg daily. Monitor with increased mobility 11. Tobacco abuse. Provide counseling 12. Pravachol 40 mg daily 13. Constipation. Laxative assistance        Post Admission Physician Evaluation: 1. Functional deficits secondary  to right BKA. 2. Patient is admitted to receive collaborative, interdisciplinary care between the physiatrist, rehab nursing staff, and therapy team. 3. Patient's level of medical complexity and substantial therapy needs in context of that medical necessity cannot be provided at a lesser intensity of care such as a SNF. 4. Patient has experienced substantial functional loss from his/her baseline which was documented above under the "Functional History" and "Functional Status" headings.  Judging by the patient's diagnosis, physical exam, and functional history, the patient has potential for functional progress which will result in measurable gains while on inpatient rehab.  These gains will be of substantial and practical use upon discharge  in facilitating mobility and self-care at the household level. 5. Physiatrist will provide 24 hour management of medical needs as well as oversight of the therapy plan/treatment and provide guidance as appropriate regarding the interaction  of the two. 6. The Preadmission Screening has been reviewed and patient status is unchanged unless otherwise stated above. 7. 24 hour rehab nursing will assist with bladder management, bowel management, safety, skin/wound care, disease management, medication administration, pain management and patient education  and help integrate therapy concepts, techniques,education, etc. 8. PT will assess and treat for/with: Lower extremity strength, range of motion, stamina, balance, functional mobility, safety, adaptive techniques and equipment, pain mgt, pre-prosthetic education.   Goals are: mod I. 9. OT will assess and  treat for/with: ADL's, functional mobility, safety, upper extremity strength, adaptive techniques and equipment, pain mgt, pre-prosthetic education, community reentry.   Goals are: mod I. Therapy may proceed with showering this patient. 10. SLP will assess and treat for/with: n/a.  Goals are: n/a. 11. Case Management and Social Worker will assess and treat for psychological issues and discharge planning. 12. Team conference will be held weekly to assess progress toward goals and to determine barriers to discharge. 13. Patient will receive at least 3 hours of therapy per day at least 5 days per week. 14. ELOS: 7-10 days       15. Prognosis:  excellent   I have personally performed a face to face diagnostic evaluation of this patient and formulated the key components of the plan.  Additionally, I have personally reviewed laboratory data, imaging studies, as well as relevant notes and concur with the physician assistant's documentation above.  Ranelle Oyster, MD, FAAPMR     Mcarthur Rossetti Angiulli, PA-C 01/23/2019

## 2019-01-23 NOTE — H&P (Signed)
Physical Medicine and Rehabilitation Admission H&P    Chief Complaint  Patient presents with   Post-op Problem   R foot infection  Chief complaint:stump pain  HPI: Aldine ContesJanice M Cupit is a 63 year old right-handed female with history of type 2 diabetes mellitus,CAD,tobacco abuse,TBI with memory deficits hypertension, left AKA 2018 as well as recent right fourth toe amputation 11/11/2018 and had been discharged to skilled nursing facility before returning home. Patient lives alone in a handicapped accessible apartment and primarily uses a wheelchair prior to admission although she does have a prosthesis for left AKA with limited use after right foot infection. She has a niece and nephew who plans to provide assistance as needed on discharge. Presented 01/19/2019 with gangrenous right foot ischemic changes. Limb was not felt to be salvageable and underwent right BKA 01/19/2019 per Dr. Darrick PennaFields.Hospital course pain management. Stump sock has been applied.  Subcutaneous heparin for DVT prophylaxis. Blood sugars closely monitored maintained on insulin therapy. Therapy evaluations completed and patient was admitted for a comprehensive rehabilitation program.  Review of Systems  Constitutional: Negative for chills and fever.  HENT: Negative for hearing loss.   Eyes: Negative for blurred vision and double vision.  Respiratory: Negative for cough and shortness of breath.   Cardiovascular: Negative for chest pain and palpitations.  Gastrointestinal: Positive for constipation. Negative for heartburn, nausea and vomiting.  Genitourinary: Negative for dysuria, flank pain and hematuria.  Musculoskeletal: Positive for myalgias.  Skin: Negative for rash.  Psychiatric/Behavioral: Positive for depression and memory loss. The patient has insomnia.   All other systems reviewed and are negative.  Past Medical History:  Diagnosis Date   Allergic rhinitis    Depression    Diabetes (HCC)    Dyslipidemia      HTN (hypertension)    Insomnia    OSA (obstructive sleep apnea)    no cpap   Urinary incontinence    Past Surgical History:  Procedure Laterality Date   ABDOMINAL AORTOGRAM W/LOWER EXTREMITY N/A 06/24/2017   Procedure: ABDOMINAL AORTOGRAM W/LOWER EXTREMITY;  Surgeon: Maeola Harmanain, Brandon Christopher, MD;  [facility];  Service: Cardiovascular;  Laterality: N/A;   ABDOMINAL AORTOGRAM W/LOWER EXTREMITY Right 10/09/2017   Procedure: ABDOMINAL AORTOGRAM W/LOWER EXTREMITY;  Surgeon: Maeola Harmanain, Brandon Christopher, MD;  [facility];  Service: Cardiovascular;  Laterality: Right;   AMPUTATION Left 07/23/2017   Procedure: AMPUTATION  BELOW KNEE;  Surgeon: Maeola Harmanain, Brandon Christopher, MD;  [facility];  Service: Vascular;  Laterality: Left;   AMPUTATION Left 08/12/2017   Procedure: REVISION BELOW KNEE;  Surgeon: Maeola Harmanain, Brandon Christopher, MD;  [facility];  Service: Vascular;  Laterality: Left;   AMPUTATION Left 08/27/2017   Procedure: left ABOVE KNEE AMPUTATION;  Surgeon: Maeola Harmanain, Brandon Christopher, MD;  [facility];  Service: Vascular;  Laterality: Left;   AMPUTATION Left 08/30/2017   Procedure: REVISION AMPUTATION ABOVE KNEE;  Surgeon: Nada LibmanBrabham, Vance W, MD;  [facility];  Service: Vascular;  Laterality: Left;   AMPUTATION Right 11/04/2017   Procedure: AMPUTATION RIGHT FOURTH TOE;  Surgeon: Maeola Harmanain, Brandon Christopher, MD;  [facility];  Service: Vascular;  Laterality: Right;   AMPUTATION Right 11/12/2018   Procedure: AMPUTATION OF second, third and fifth toes;  Surgeon: Nada LibmanBrabham, Vance W, MD;  Location: [facility] OR;  Service: Vascular;  Laterality: Right;   AMPUTATION Right 01/19/2019   Procedure: AMPUTATION BELOW KNEE;  Surgeon: Sherren KernsFields, Charles E, MD;  [facility];  Service: Vascular;  Laterality: Right;  APPLICATION OF WOUND VAC Left 08/27/2017   Procedure: APPLICATION OF WOUND VAC;  Surgeon: Maeola Harman, MD;  Location[facility];  Service:  Vascular;  Laterality: Left;   CARPAL TUNNEL RELEASE Right    COLONOSCOPY     LOWER EXTREMITY ANGIOGRAM Right 11/12/2018   Procedure: Rosalin Hawking OF Right LEG;  Surgeon: Nada Libman, MD;  [facility];  Service: Vascular;  Laterality: Right;   LOWER EXTREMITY INTERVENTION Right 10/09/2017   Procedure: LOWER EXTREMITY INTERVENTION;  Surgeon: Maeola Harman, MD;  Location: [facility] INV[facility] CV LAB;  Service: Cardiovascular;  Laterality: Right;   TOE AMPUTATION Right 11/12/2018   2ND 3RD & 5TH TOE    TRANSMETATARSAL AMPUTATION Right 11/19/2018   Procedure: TRANSMETATARSAL AMPUTATION REVISION;  Surgeon: Nada Libman, MD;  [facility]: [facility] OR;  Service: Vascular;  Laterality: Right;   Family History  Problem Relation Age of Onset   Heart attack Mother    Alcohol abuse Mother    Heart attack Father    Alcohol abuse Father    Heart attack Sister    Heart attack Brother    Hypercholesterolemia Sister    Diabetes Neg Hx    Social History:  reports that she quit smoking about 5 months ago. Her smoking use included cigarettes. She has a 51.00 pack-year smoking history. She has never used smokeless tobacco. She reports that she does not drink alcohol or use drugs. Allergies:  Allergies  Allergen Reactions   Metformin And Related Diarrhea   Milk-Related Compounds Diarrhea and Other (See Comments)    incontinence   Doxycycline Rash   Medications Prior to Admission  Medication Sig Dispense Refill   aspirin EC 325 MG tablet Take 325 mg by mouth daily.     collagenase (SANTYL) ointment Apply 1 application topically daily. 30 g 1   gabapentin (NEURONTIN) 300 MG capsule Take 300 mg by mouth at bedtime.      Insulin Isophane & Regular Human (HUMULIN 70/30 KWIKPEN) (70-30) 100 UNIT/ML PEN Inject 70 Units into the skin 2 (two) times daily. Inject 70 units with breakfast and 60 units with dinner. Hold for CBG less than 60. Notify MD if CBG greater than 300 (DM) (Patient  taking differently: Inject 90-100 Units into the skin See admin instructions. Inject 100 units subcutaneously with breakfast and 90 units with supper) 15 mL 0   LORazepam (ATIVAN) 0.5 MG tablet Take 1 tablet (0.5 mg total) by mouth 2 (two) times daily as needed for anxiety. (Patient taking differently: Take 0.5 mg by mouth 2 (two) times daily as needed for anxiety or sleep. ) 15 tablet 0   losartan (COZAAR) 100 MG tablet Take 1 tablet (100 mg total) by mouth daily. 30 tablet 0   Multiple Vitamin (MULTIVITAMIN WITH MINERALS) TABS tablet Place 1 tablet into feeding tube daily. (Patient taking differently: Take 1 tablet by mouth daily. ) 30 tablet 0   nicotine polacrilex (NICORELIEF) 2 MG gum Take 1 each (2 mg total) by mouth daily as needed for smoking cessation. (Patient taking differently: Take 2 mg by mouth 4 (four) times daily as needed for smoking cessation. ) 30 tablet 0   pravastatin (PRAVACHOL) 40 MG tablet Take 40 mg by mouth daily.     sertraline (ZOLOFT) 100 MG tablet Take 1 tablet (100 mg total) by mouth daily. 30 tablet 0   traZODone (DESYREL) 50 MG tablet Take 1 tablet (50 mg total) by mouth at bedtime. 30 tablet 0   Vitamin E 180  MG CAPS Take 180 mg by mouth daily. 30 capsule 0   Zinc 50 MG TABS Take 1 tablet (50 mg total) by mouth daily. 30 tablet 0   [DISCONTINUED] amitriptyline (ELAVIL) 25 MG tablet Take 1 tablet (25 mg total) by mouth at bedtime. (Patient taking differently: Take 100 mg by mouth at bedtime. ) 30 tablet 0   [DISCONTINUED] traMADol (ULTRAM) 50 MG tablet Take 1 tablet (50 mg total) by mouth every 6 (six) hours as needed. (Patient taking differently: Take 50 mg by mouth every 6 (six) hours as needed (pain). ) 30 tablet 0   aspirin 81 MG EC tablet Take 1 tablet (81 mg total) by mouth daily. (Patient not taking: Reported on 01/18/2019) 30 tablet 0   clopidogrel (PLAVIX) 75 MG tablet Take 1 tablet (75 mg total) by mouth daily with breakfast. (Patient not taking:  Reported on 01/17/2019) 30 tablet 0   oxyCODONE-acetaminophen (PERCOCET/ROXICET) 5-325 MG tablet Take 1 tablet by mouth every 6 (six) hours as needed for severe pain. (Patient not taking: Reported on 01/18/2019) 30 tablet 0   polyethylene glycol (MIRALAX / GLYCOLAX) packet Take 17 g by mouth daily as needed for moderate constipation. (Patient not taking: Reported on 01/18/2019) 14 each 0   pravastatin (PRAVACHOL) 20 MG tablet Take 2 tablets (40 mg total) by mouth daily. (Patient not taking: Reported on 01/18/2019) 30 tablet 0   senna-docusate (SENOKOT-S) 8.6-50 MG tablet Take 1 tablet by mouth 2 (two) times daily. (Patient not taking: Reported on 01/18/2019) 60 tablet 0   TRUE METRIX BLOOD GLUCOSE TEST test strip USE TO CHECK YOUR BLOOD SUGAR TWICE A DAY (DX  E11.21) IN VITRO 90 DAYS      Drug Regimen Review Drug regimen was reviewed and remains appropriate with no significant issues identified  Home: Home Living Family/patient expects to be discharged to:: Private residence Living Arrangements: Alone Available Help at Discharge: Family, Available PRN/intermittently(niece and nephew help out ) Type of Home: Apartment Home Access: Level entry Home Layout: One level Bathroom Shower/Tub: Engineer, manufacturing systems: Handicapped height Bathroom Accessibility: Yes Home Equipment: Tub bench, Grab bars - tub/shower, Hand held shower head, Wheelchair - manual   Functional History: Prior Function Level of Independence: Needs assistance Gait / Transfers Assistance Needed: mostly transferring independently to w/c utilizing R LE to mobilize with w/c. sometimes requires nephews help and able to transfer to/from tub bench  ADL's / Homemaking Assistance Needed: family does cleaning, niece and nephew take her to Dr's appointments and shopping  Functional Status:  Mobility: Bed Mobility Overal bed mobility: Needs Assistance Bed Mobility: Supine to Sit Supine to sit: Mod assist General bed  mobility comments: mod assist to raise trunk Transfers Overall transfer level: Needs assistance Equipment used: None Transfers: Lateral/Scoot Transfers Anterior-Posterior transfers: +2 physical assistance, Mod assist  Lateral/Scoot Transfers: Mod assist General transfer comment: pt somewhat inefficient, did not use transfer board this visit therefore requiring more assistance with use of bed pad      ADL: ADL Overall ADL's : Needs assistance/impaired Eating/Feeding: Independent, Sitting Grooming: Set up, Sitting, Wash/dry hands, Wash/dry face, Oral care Upper Body Bathing: Minimal assistance, Sitting Lower Body Bathing: Moderate assistance, Bed level, Sitting/lateral leans Upper Body Dressing : Minimal assistance, Sitting Lower Body Dressing: Maximal assistance, Sitting/lateral leans, Bed level Toilet Transfer: Moderate assistance, +2 for physical assistance, +2 for safety/equipment Toilet Transfer Details (indicate cue type and reason): Mod A +2 for posterior transfer to recliner. Using bed pads to facilitate hips back into  the chair. Cues for sequencing.  Functional mobility during ADLs: Moderate assistance, +2 for physical assistance, +2 for safety/equipment General ADL Comments: Worked at bed level on pulling trunk from semi reclined into sitting using B bed rails followed by sitting balance reaching within BoS in long sitting.  Cognition: Cognition Overall Cognitive Status: Within Functional Limits for tasks assessed Orientation Level: Oriented X4 Cognition Arousal/Alertness: Awake/alert Behavior During Therapy: WFL for tasks assessed/performed Overall Cognitive Status: Within Functional Limits for tasks assessed General Comments: Reports she had to have dog Mango put down in 16-Oct-2023 and her sister died last 2023/04/16, depressed   Physical Exam: Blood pressure (!) 162/76, pulse 67, temperature 97.9 F (36.6 C), temperature source Axillary, resp. rate 14, height  (1.575 m),  weight 92.5 kg, SpO2 97 %. Physical Exam  Constitutional: She is oriented to person, place, and time. No distress.  obese  HENT:  Head: Normocephalic and atraumatic.  Eyes: Pupils are equal, round, and reactive to light. EOM are normal. Left eye exhibits no discharge.  Neck: Normal range of motion. No tracheal deviation present. No thyromegaly present.  Cardiovascular: Normal rate. Exam reveals no friction rub.  No murmur heard. Respiratory: Effort normal. No respiratory distress. She has no wheezes. She has no rales.  GI: Soft. She exhibits no distension. There is no abdominal tenderness.  Musculoskeletal:     Comments: Right BK very tender to palpation, edematous  Neurological: She is alert and oriented to person, place, and time. No cranial nerve deficit. Coordination normal.  Alert acute distress. Follows commands. Provides her name and age.Fair butLimited medical historian    Skin: Skin is warm.  AKA well-healed. Right BKA is CDI with staples.   Psychiatric: She has a normal mood and affect. Her behavior is normal. Judgment and thought content normal.    Results for orders placed or performed during the hospital encounter of 01/17/19 (from the past 48 hour(s))  Glucose, capillary     Status: Abnormal   Collection Time: 01/21/19 11:20 AM  Result Value Ref Range   Glucose-Capillary 124 (H) 70 - 99 mg/dL  Glucose, capillary     Status: Abnormal   Collection Time: 01/21/19  4:42 PM  Result Value Ref Range   Glucose-Capillary 188 (H) 70 - 99 mg/dL  Glucose, capillary     Status: Abnormal   Collection Time: 01/21/19  8:55 PM  Result Value Ref Range   Glucose-Capillary 220 (H) 70 - 99 mg/dL  Glucose, capillary     Status: Abnormal   Collection Time: 01/22/19  6:12 AM  Result Value Ref Range   Glucose-Capillary 268 (H) 70 - 99 mg/dL  Glucose, capillary     Status: Abnormal   Collection Time: 01/22/19  7:50 AM  Result Value Ref Range   Glucose-Capillary 204 (H) 70 - 99 mg/dL    Glucose, capillary     Status: Abnormal   Collection Time: 01/22/19 11:29 AM  Result Value Ref Range   Glucose-Capillary 166 (H) 70 - 99 mg/dL  Glucose, capillary     Status: Abnormal   Collection Time: 01/22/19  4:03 PM  Result Value Ref Range   Glucose-Capillary 253 (H) 70 - 99 mg/dL  Glucose, capillary     Status: Abnormal   Collection Time: 01/22/19  9:18 PM  Result Value Ref Range   Glucose-Capillary 181 (H) 70 - 99 mg/dL  Glucose, capillary     Status: Abnormal   Collection Time: 01/23/19  6:41 AM  Result Value Ref Range  Glucose-Capillary 202 (H) 70 - 99 mg/dL   No results found.     Medical Problem List and Plan: 1.  Decreased functional mobility secondary to right BKA 01/19/2019 as well as history of left AKA 2018  -admit to inpatient rehab 2.  Antithrombotics: -DVT/anticoagulation:  Subcutaneous heparin  -antiplatelet therapy: aspirin 81 mg daily 3. Pain Management: Elavil 25 mg daily at bedtime,Neurontin 300 mg daily at bedtime,oxycodone as needed  -discussed massage and desensitization for R BKA 4. Mood: trazodone 50 mg daily at bedtime, Zoloft 100 mg daily, Ativan 0.5 mg twice a day as needed  -antipsychotic agents: N/A 5. Neuropsych: This patient is capable of making decisions on her own behalf. 6. Skin/Wound Care:  Routine skin checks, stump sock  -apply stump shrinker when able to tolerate 7. Fluids/Electrolytes/Nutrition:  Routine ins and outs with follow-up chemistries 8. CAD. Continue aspirin therapy. No chest pain 9. Diabetes mellitus with peripheral neuropathy. Hemoglobin A1c 8.5.  -NovoLog 70/30 70 units at breakfast 60 units at supper  -Glucophage 500 mg daily.   -Check blood sugars before meals and at bedtime. Diabetic teaching 10. Hypertension. Cozaar 100 mg daily. Monitor with increased mobility 11. Tobacco abuse. Provide counseling 12. Pravachol 40 mg daily 13. Constipation. Laxative assistance       Charlton Amor, PA-C 01/23/2019

## 2019-01-23 NOTE — Progress Notes (Signed)
Physical Therapy Treatment Patient Details Name: Danielle ContesJanice M Shen MRN: [id number] DOB: 20-Dec-1955 Today's Date: 01/23/2019    History of Present Illness 63 y.o. female, s/p PTA of TPT and peroneal artery by Dr Myra GianottiBrabham 2/20.  She subsequently had an open TMA and has been doing local wound care. PMH: L AKA,depression, sleep apnea, dyslipidemia and CAD. s/p R BKA 01/19/19    PT Comments    Patient min to mod A for lateral scoot transfers with board today. Bed adjusted for neutral height to chair this visit. Agree with updated recs for CIR as patient would greatly benefit from skilled therapy to progress functional mobility.    Follow Up Recommendations  CIR     Equipment Recommendations  Other (comment)    Recommendations for Other Services       Precautions / Restrictions Precautions Precautions: Fall Precaution Comments: now bilateral amputee with change to CoG causing increased posterior lean Restrictions RLE Weight Bearing: Non weight bearing LLE Weight Bearing: Non weight bearing    Mobility  Bed Mobility Overal bed mobility: Needs Assistance Bed Mobility: Supine to Sit     Supine to sit: Mod assist     General bed mobility comments: mod assist to raise trunk  Transfers Overall transfer level: Needs assistance Equipment used: None Transfers: Lateral/Scoot Transfers          Lateral/Scoot Transfers: Mod assist General transfer comment: pt somewhat inefficient, did not use transfer board this visit therefore requiring more assistance with use of bed pad  Ambulation/Gait                 Stairs             Wheelchair Mobility    Modified Rankin (Stroke Patients Only)       Balance Overall balance assessment: Needs assistance   Sitting balance-Leahy Scale: Fair Sitting balance - Comments: cannot accept challenges to static sitting balance Postural control: Posterior lean                                  Cognition  Arousal/Alertness: Awake/alert Behavior During Therapy: WFL for tasks assessed/performed Overall Cognitive Status: Within Functional Limits for tasks assessed                                        Exercises      General Comments        Pertinent Vitals/Pain Pain Assessment: Faces Faces Pain Scale: Hurts even more Pain Location: R LE residual limb,  Pain Descriptors / Indicators: Sore    Home Living                      Prior Function            PT Goals (current goals can now be found in the care plan section) Acute Rehab PT Goals Patient Stated Goal: go home PT Goal Formulation: With patient Time For Goal Achievement: 02/03/19 Potential to Achieve Goals: Fair Progress towards PT goals: Progressing toward goals    Frequency    Min 2X/week      PT Plan Current plan remains appropriate    Co-evaluation              AM-PAC PT "6 Clicks" Mobility   Outcome Measure  Help needed turning from your back to  your side while in a flat bed without using bedrails?: A Lot Help needed moving from lying on your back to sitting on the side of a flat bed without using bedrails?: Total Help needed moving to and from a bed to a chair (including a wheelchair)?: Total Help needed standing up from a chair using your arms (e.g., wheelchair or bedside chair)?: Total Help needed to walk in hospital room?: Total Help needed climbing 3-5 steps with a railing? : Total 6 Click Score: 7    End of Session Equipment Utilized During Treatment: Gait belt Activity Tolerance: Patient limited by pain Patient left: in chair;with call bell/phone within reach;with chair alarm set Nurse Communication: Other (comment);Mobility status;Patient requests pain meds;Need for lift equipment;Precautions;Weight bearing status PT Visit Diagnosis: Other abnormalities of gait and mobility (R26.89);Muscle weakness (generalized) (M62.81);Difficulty in walking, not elsewhere  classified (R26.2);Pain Pain - Right/Left: Right     Time: 1210-1230 PT Time Calculation (min) (ACUTE ONLY): 20 min  Charges:  $Therapeutic Activity: 8-22 mins                     Etta Grandchild, PT, DPT Acute Rehabilitation Services Pager: [phone] Office: [phone]     Etta Grandchild 01/23/2019, 1:16 PM

## 2019-01-23 NOTE — Telephone Encounter (Signed)
-----   Message from Lars Mage, [place] sent at 01/23/2019 10:59 AM EDT -----  S/P right BKA f/u with Dr. Darrick Penna in 4 weeks

## 2019-01-23 NOTE — Consult Note (Signed)
   [facility] CM Inpatient Consult   01/23/2019  Leetha Bernhart [facility] 09/22/1956 [id number]    Follow-up note:  Noted patient's disposition upon review of transition of care CM note that patient has been approved for CIR [facility]) and has been cleared by attending team to transition to CIR today.  Discharge plan is to go to [facility] prior to returning home. Per Inpatient Rehab admissions coordinator note, patient's nephew Tammy Sours) is willing and able to arrange 24/7 help at discharge if needed.  If there are any changes in disposition needs, please place a referral to [facility] Care management for services and follow-up as appropriate.   For questions and additional information, please contact:  Coulson Wehner A. Suan Pyeatt, BSN, RN-BC [facility] Liaison Cell: [phone]

## 2019-01-23 NOTE — PMR Pre-admission (Signed)
PMR Admission Coordinator Pre-Admission Assessment  Patient: Danielle Buchanan is an 63 y.o., female MRN: [id number] DOB: [birth year] Height: '5\' 2"'  (157.5 cm) Weight: 92.5 kg  Insurance Information HMO: yes    PPO:      PCP:      IPA:      80/20:      OTHER:  PRIMARY: [employer]      Policy#: [id number]      Subscriber: patient CM Name: Jenny Reichmann      Phone#: [phone]     Fax#: [phone] Pre-Cert#: [id number], automatic approval for CIR from [place].  CM will contact when updates are due.      Employer:  Benefits:  Phone #: [phone]     Name:  Eff. Date: 10/08/2018     Deduct: $0      Out of Pocket Max: $3400 (met [phone])      Life Max: n/a CIR: $295/day for days 1-6      SNF: $0/days 1-20, $178/days 21-100 Outpatient:      Co-Pay: $10-$40 Home Health: 100%      Co-Pay:  DME: 80%     Co-Pay: 20%  SECONDARY:       Policy#:       Subscriber:  CM Name:       Phone#:      Fax#:  Pre-Cert#:       Employer:  Benefits:  Phone #:      Name:  Eff. Date:      Deduct:       Out of Pocket Max:       Life Max:  CIR:       SNF:  Outpatient:      Co-Pay:  Home Health:       Co-Pay:  DME:      Co-Pay:   Medicaid Application Date:       Case Manager:  Disability Application Date:       Case Worker:   The "Data Collection Information Summary" for patients in Inpatient Rehabilitation Facilities with attached "Privacy Act [place] Records" was provided and verbally reviewed with: Patient and Family  Emergency Contact Information Contact Information    Name Relation Home Work Mobile   [place]   [phone]   Richardson,Lisa Sister [phone]        Current Medical History  Patient Admitting Diagnosis: R BKA  History of Present Illness: Danielle Buchanan is a 63 year old right-handed female with history of type 2 diabetes mellitus,CAD,tobacco abuse,TBI with memory deficits hypertension, left AKA 2018 as well as recent right fourth toe amputation 11/11/2018 and  had been discharged to skilled nursing facility before returning home.  Presented 01/19/2019 with gangrenous right foot ischemic changes. Limb was not felt to be salvageable and underwent right BKA 01/19/2019 per Dr. Oneida Alar. Hospital course pain management. Stump sock has been applied.  Subcutaneous heparin for DVT prophylaxis. Blood sugars closely monitored maintained on insulin therapy.    Patient's medical record from [facility] has been reviewed by the rehabilitation admission coordinator and physician.  Past Medical History  Past Medical History:  Diagnosis Date  . Allergic rhinitis   . Depression   . Diabetes ([place])   . Dyslipidemia   . HTN (hypertension)   . Insomnia   . OSA (obstructive sleep apnea)    no cpap  . Urinary incontinence     Family History   family history includes Alcohol abuse in her father and mother; Heart attack in her brother,  father, mother, and sister; Hypercholesterolemia in her sister.  Prior Rehab/Hospitalizations Has the patient had prior rehab or hospitalizations prior to admission? No  Has the patient had major surgery during 100 days prior to admission? Yes   Current Medications  Current Facility-Administered Medications:  .  0.9 %  sodium chloride infusion, 250 mL, Intravenous, PRN, Theda Sers, Emma M, PA-C .  0.9 %  sodium chloride infusion, 250 mL, Intravenous, PRN, Theda Sers, Emma M, PA-C .  acetaminophen (TYLENOL) tablet 325-650 mg, 325-650 mg, Oral, Q4H PRN **OR** acetaminophen (TYLENOL) suppository 325-650 mg, 325-650 mg, Rectal, Q4H PRN, Theda Sers, Emma M, PA-C .  alum & mag hydroxide-simeth (MAALOX/MYLANTA) 200-200-20 MG/5ML suspension 15-30 mL, 15-30 mL, Oral, Q2H PRN, Theda Sers, Emma M, PA-C .  amitriptyline (ELAVIL) tablet 25 mg, 25 mg, Oral, QHS, Collins, Emma M, PA-C, 25 mg at 01/22/19 2121 .  aspirin EC tablet 81 mg, 81 mg, Oral, Daily, Laurence Slate M, PA-C, 81 mg at 01/23/19 1001 .  collagenase (SANTYL) ointment 1 application, 1  application, Topical, Daily, Ulyses Amor, PA-C, 1 application at 70/62/37 1415 .  docusate sodium (COLACE) capsule 100 mg, 100 mg, Oral, BID, Laurence Slate M, PA-C, 100 mg at 01/22/19 6283 .  gabapentin (NEURONTIN) capsule 300 mg, 300 mg, Oral, QHS, Collins, Emma M, PA-C, 300 mg at 01/22/19 2121 .  guaiFENesin-dextromethorphan (ROBITUSSIN DM) 100-10 MG/5ML syrup 15 mL, 15 mL, Oral, Q4H PRN, Theda Sers, Emma M, PA-C .  heparin injection 5,000 Units, 5,000 Units, Subcutaneous, Q8H, Ulyses Amor, [place], 5,000 Units at 01/23/19 [phone] .  hydrALAZINE (APRESOLINE) injection 5 mg, 5 mg, Intravenous, Q20 Min PRN, Collins, Emma M, PA-C .  insulin aspart (novoLOG) injection 0-20 Units, 0-20 Units, Subcutaneous, TID WC, Collins, Emma M, PA-C, 7 Units at 01/23/19 [phone] .  insulin aspart protamine- aspart (NOVOLOG MIX 70/30) injection 60 Units, 60 Units, Subcutaneous, Q supper, Ulyses Amor, [place], 60 Units at 01/22/19 1715 .  insulin aspart protamine- aspart (NOVOLOG MIX 70/30) injection 70 Units, 70 Units, Subcutaneous, Q breakfast, Ulyses Amor, [place], 70 Units at 01/23/19 0827 .  labetalol (NORMODYNE,TRANDATE) injection 10 mg, 10 mg, Intravenous, Q10 min PRN, Ulyses Amor, PA-C, 10 mg at 01/23/19 7371 .  lactated ringers infusion, , Intravenous, Continuous, Ulyses Amor, [place], Last Rate: 10 mL/hr at 01/19/19 0957 .  LORazepam (ATIVAN) tablet 0.5 mg, 0.5 mg, Oral, BID PRN, Laurence Slate M, PA-C .  losartan (COZAAR) tablet 100 mg, 100 mg, Oral, Daily, Laurence Slate M, PA-C, 100 mg at 01/23/19 1001 .  magnesium sulfate IVPB 2 g 50 mL, 2 g, Intravenous, Daily PRN, Theda Sers, Emma M, PA-C .  metFORMIN (GLUCOPHAGE-XR) 24 hr tablet 500 mg, 500 mg, Oral, Q breakfast, Collins, Emma M, PA-C .  metoprolol tartrate (LOPRESSOR) injection 2-5 mg, 2-5 mg, Intravenous, Q2H PRN, Theda Sers, Emma M, PA-C .  morphine 2 MG/ML injection 2-5 mg, 2-5 mg, Intravenous, Q1H PRN, Laurence Slate M, PA-C, 4 mg at 01/21/19 2330 .   multivitamin with minerals tablet 1 tablet, 1 tablet, Per Tube, Daily, Collins, Emma M, PA-C, 1 tablet at 01/23/19 1001 .  nicotine polacrilex (NICORETTE) gum 2 mg, 2 mg, Oral, Daily PRN, Laurence Slate M, PA-C, 2 mg at 01/20/19 1012 .  ondansetron (ZOFRAN) injection 4 mg, 4 mg, Intravenous, Q6H PRN, Theda Sers, Emma M, PA-C .  oxyCODONE (Oxy IR/ROXICODONE) immediate release tablet 5-10 mg, 5-10 mg, Oral, Q4H PRN, Ulyses Amor, PA-C, 10 mg at 01/21/19 2043 .  pantoprazole (PROTONIX) EC tablet  40 mg, 40 mg, Oral, Daily, Laurence Slate M, PA-C, 40 mg at 01/23/19 1001 .  phenol (CHLORASEPTIC) mouth spray 1 spray, 1 spray, Mouth/Throat, PRN, Collins, Emma M, PA-C .  piperacillin-tazobactam (ZOSYN) IVPB 3.375 g, 3.375 g, Intravenous, Q8H, Collins, Emma M, [place], Last Rate: 12.5 mL/hr at 01/23/19 0639, 3.375 g at 01/23/19 0639 .  polyethylene glycol (MIRALAX / GLYCOLAX) packet 17 g, 17 g, Oral, Daily PRN, Theda Sers, Emma M, PA-C .  potassium chloride SA (K-DUR,KLOR-CON) CR tablet 20-40 mEq, 20-40 mEq, Oral, Once, Collins, Emma M, PA-C .  potassium chloride SA (K-DUR,KLOR-CON) CR tablet 20-40 mEq, 20-40 mEq, Oral, Daily PRN, Theda Sers, Emma M, PA-C .  pravastatin (PRAVACHOL) tablet 40 mg, 40 mg, Oral, Daily, Laurence Slate M, PA-C, 40 mg at 01/23/19 1001 .  sertraline (ZOLOFT) tablet 100 mg, 100 mg, Oral, Daily, Laurence Slate M, PA-C, 100 mg at 01/23/19 1001 .  sodium chloride flush (NS) 0.9 % injection 3 mL, 3 mL, Intravenous, Q12H, Collins, Emma M, PA-C, 3 mL at 01/23/19 0955 .  sodium chloride flush (NS) 0.9 % injection 3 mL, 3 mL, Intravenous, PRN, Theda Sers, Emma M, PA-C .  sodium chloride flush (NS) 0.9 % injection 3 mL, 3 mL, Intravenous, Q12H, Collins, Emma M, PA-C, 3 mL at 01/23/19 1004 .  sodium chloride flush (NS) 0.9 % injection 3 mL, 3 mL, Intravenous, PRN, Laurence Slate M, PA-C, 3 mL at 01/23/19 1003 .  traZODone (DESYREL) tablet 50 mg, 50 mg, Oral, QHS, Collins, Emma M, PA-C, 50 mg at 01/22/19 2122 .   zinc sulfate capsule 220 mg, 220 mg, Oral, Daily, Laurence Slate M, PA-C, 220 mg at 01/23/19 1000  Patients Current Diet:  Diet Order            Diet Carb Modified Fluid consistency: Thin; Room service appropriate? Yes with Assist  Diet effective now              Precautions / Restrictions Precautions Precautions: Fall Precaution Comments: now bilateral amputee with change to CoG causing increased posterior lean Restrictions Weight Bearing Restrictions: Yes RLE Weight Bearing: Non weight bearing LLE Weight Bearing: Non weight bearing   Has the patient had 2 or more falls or a fall with injury in the past year? No  Prior Activity Level Limited Community (1-2x/wk): not driving, limited community access for errands/appointments, w/c level  Prior Functional Level Self Care: Did the patient need help bathing, dressing, using the toilet or eating? Needed some help for transfers in/out of tub  Indoor Mobility: Did the patient need assistance with walking from room to room (with or without device)? Independent w/c mobility  Stairs: Did the patient need assistance with internal or external stairs (with or without device)? Dependent , pt is w/c level at baseline  Functional Cognition: Did the patient need help planning regular tasks such as shopping or remembering to take medications? Needed some help  Home Assistive Devices / Equipment Home Assistive Devices/Equipment: Grab bars in shower, Hand-held shower hose, Long-handled sponge, Raised toilet seat with rails, Shower chair with back, Wheelchair Home Equipment: Tub bench, Grab bars - tub/shower, Hand held shower head, Wheelchair - manual  Prior Device Use: Indicate devices/aids used by the patient prior to current illness, exacerbation or injury? Manual wheelchair  Current Functional Level Cognition  Overall Cognitive Status: Within Functional Limits for tasks assessed Orientation Level: Oriented X4 General Comments: Reports she  had to have dog [place]ango put down in 2023/09/23 and her sister died last 2023-03-24, depressed  Extremity Assessment (includes Sensation/Coordination)  Upper Extremity Assessment: Generalized weakness  Lower Extremity Assessment: RLE deficits/detail, LLE deficits/detail RLE Deficits / Details: new BKA, increased pain and decreased knee extension  RLE: Unable to fully assess due to pain LLE Deficits / Details: prior AKA     ADLs  Overall ADL's : Needs assistance/impaired Eating/Feeding: Independent, Sitting Grooming: Set up, Sitting, Wash/dry hands, Wash/dry face, Oral care Upper Body Bathing: Minimal assistance, Sitting Lower Body Bathing: Moderate assistance, Bed level, Sitting/lateral leans Upper Body Dressing : Minimal assistance, Sitting Lower Body Dressing: Maximal assistance, Sitting/lateral leans, Bed level Toilet Transfer: Moderate assistance, +2 for physical assistance, +2 for safety/equipment Toilet Transfer Details (indicate cue type and reason): Mod A +2 for posterior transfer to recliner. Using bed pads to facilitate hips back into the chair. Cues for sequencing.  Functional mobility during ADLs: Moderate assistance, +2 for physical assistance, +2 for safety/equipment General ADL Comments: Worked at bed level on pulling trunk from semi reclined into sitting using B bed rails followed by sitting balance reaching within BoS in long sitting.    Mobility  Overal bed mobility: Needs Assistance Bed Mobility: Supine to Sit Supine to sit: Mod assist General bed mobility comments: mod assist to raise trunk    Transfers  Overall transfer level: Needs assistance Equipment used: None Transfers: Lateral/Scoot Transfers Anterior-Posterior transfers: +2 physical assistance, Mod assist  Lateral/Scoot Transfers: Mod assist General transfer comment: pt somewhat inefficient, did not use transfer board this visit therefore requiring more assistance with use of bed pad    Ambulation / Gait /  Stairs / Wheelchair Mobility    unable   Posture / Balance Dynamic Sitting Balance Sitting balance - Comments: cannot accept challenges to static sitting balance Balance Overall balance assessment: Needs assistance Sitting-balance support: Bilateral upper extremity supported(bilateral residual limbs supported) Sitting balance-Leahy Scale: Fair Sitting balance - Comments: cannot accept challenges to static sitting balance Postural control: Posterior lean    Special needs/care consideration BiPAP/CPAP no CPM no Continuous Drip IV zosyn Dialysis no        Days n/a Life Vest no Oxygen on 2L via Nasal Cannula occasionally while in acute care Special Bed no Trach Size no Wound Vac (area) no      Location n/a Skin surgical incision to RLE                            Bowel mgmt: incontinent, last BM on 01/22/2019 Bladder mgmt: incontinent Diabetic mgmt: yes, novolog Behavioral consideration no Chemo/radiation no   Previous Home Environment (from acute therapy documentation) Living Arrangements: Alone Available Help at Discharge: Family, Available PRN/intermittently(niece and nephew help out ) Type of Home: Apartment Home Layout: One level Home Access: Level entry Bathroom Shower/Tub: Chiropodist: Handicapped height Bathroom Accessibility: Yes Home Care Services: Yes Type of Home Care Services: Other (Comment)(Family and friends supportative) [place] (if known): [facility]  Discharge Living Setting Plans for Discharge Living Setting: Patient's home Type of Home at Discharge: Apartment Discharge Home Layout: One level Discharge Home Access: Level entry Discharge Bathroom Shower/Tub: Tub/shower unit Discharge Bathroom Toilet: Handicapped height Discharge Bathroom Accessibility: Yes How Accessible: Accessible via wheelchair Does the patient have any problems obtaining your medications?: No  Social/Family/Support Systems Anticipated  Caregiver: pt's nephew, [name] Anticipated Caregiver's Contact Information: [phone] Ability/Limitations of Caregiver: states he will arrange 24/7 if needed at discharge Caregiver Availability: 24/7 Discharge Plan Discussed with Primary Caregiver: Yes Is  Caregiver In Agreement with Plan?: Yes Does Caregiver/Family have Issues with Lodging/Transportation while Pt is in Rehab?: No  Goals/Additional Needs Patient/Family Goal for Rehab: PT/OT mod I Expected length of stay: 7-10 days Dietary Needs: carb modified, thin Equipment Needs: slide board Pt/Family Agrees to Admission and willing to participate: Yes Program Orientation Provided & Reviewed with Pt/Caregiver Including Roles  & Responsibilities: Yes   Possible need for SNF placement upon discharge: not anticipated  Patient Condition: I have reviewed medical records from [facility], spoken with CM, and patient and family member. I met with patient at the bedside and discussed via phone for inpatient rehabilitation assessment.  Patient will benefit from ongoing PT and OT, can actively participate in 3 hours of therapy a day 5 days of the week, and can make measurable gains during the admission.  Patient will also benefit from the coordinated team approach during an Inpatient Acute Rehabilitation admission.  The patient will receive intensive therapy as well as Rehabilitation physician, nursing, social worker, and care management interventions.  Due to bladder management, bowel management, safety, skin/wound care, disease management, medication administration, pain management and patient education the patient requires 24 hour a day rehabilitation nursing.  The patient is currently min assist with mobility and basic ADLs.  Discharge setting and therapy post discharge at home with home health is anticipated.  Patient has agreed to participate in the Acute Inpatient Rehabilitation Program and will admit today.  Preadmission Screen Completed  By:  Michel Santee, 01/23/2019 11:52 AM ______________________________________________________________________   Discussed status with Dr. Naaman Plummer on 01/23/19  at 11:52 AM  and received approval for admission today.  Admission Coordinator:  Michel Santee, PT, time 11:52 AM Sudie Grumbling 01/23/19    Assessment/Plan: Diagnosis: right bka 1. Does the need for close, 24 hr/day Medical supervision in concert with the patient's rehab needs make it unreasonable for this patient to be served in a less intensive setting? Yes 2. Co-Morbidities requiring supervision/potential complications: DM, CAD, obesity, prior left AKA 3. Due to bladder management, bowel management, safety, skin/wound care, disease management, medication administration, pain management and patient education, does the patient require 24 hr/day rehab nursing? Yes 4. Does the patient require coordinated care of a physician, rehab nurse, PT (1-2 hrs/day, 5 days/week) and OT (1-2 hrs/day, 5 days/week) to address physical and functional deficits in the context of the above medical diagnosis(es)? Yes Addressing deficits in the following areas: balance, endurance, locomotion, strength, transferring, bowel/bladder control, bathing, dressing, feeding, grooming, toileting and psychosocial support 5. Can the patient actively participate in an intensive therapy program of at least 3 hrs of therapy 5 days a week? Yes 6. The potential for patient to make measurable gains while on inpatient rehab is excellent 7. Anticipated functional outcomes upon discharge from inpatients are: modified independent PT, modified independent OT, n/a SLP at w/c level.  8. Estimated rehab length of stay to reach the above functional goals is: 7-10 days 9. Anticipated D/C setting: Home 10. Anticipated post D/C treatments: [place] therapy 11. Overall Rehab/Functional Prognosis: excellent  MD Signature: Meredith Staggers, MD, [facility] 01/23/2019

## 2019-01-23 NOTE — Progress Notes (Addendum)
[facility] [place]  Subjective  - Stable without new complaints.   Objective (!) 162/76 67 97.9 F (36.6 C) (Axillary) 14 97%  Intake/Output Summary (Last 24 hours) at 01/23/2019 0916 Last data filed at 01/22/2019 1847 Gross per 24 hour  Intake 294.29 ml  Output -  Net 294.29 ml    Right BKA viable.  No erythema or drainage.   Assessment/Planning: POD # 4 right BKA  Stump sock placed She will be discharged to CIR today  Mosetta Pigeon 01/23/2019 9:16 AM --  Laboratory Lab Results: Recent Labs    01/21/19 0436  WBC 7.6  HGB 11.0*  HCT 35.2*  PLT 223   BMET Recent Labs    01/21/19 0436  NA 134*  K 3.6  CL 96*  CO2 26  GLUCOSE 178*  BUN 14  CREATININE 0.84  CALCIUM 8.9    COAG Lab Results  Component Value Date   INR 1.08 11/19/2018   INR 1.07 11/12/2018   INR 1.12 08/30/2017   No results found for: PTT  I have seen and evaluated the patient. I agree with the PA note as documented above. S/P Right BKA for gangrene of foot.  Wounds looks great.  CIR today hopefully.  Cephus Shelling, MD [facility] [place] Office: [phone] Pager: [phone]

## 2019-01-23 NOTE — TOC Transition Note (Signed)
Transition of Care [facility]) - CM/SW Discharge Note Donn Pierini RN, BSN Transitions of Care Unit 4E- RN Case Manager [phone]  Patient Details  Name: Danielle Buchanan MRN: [id number] Date of Birth: 1956-08-08  Transition of Care [facility]) CM/SW Contact:  Darrold Span, RN Phone Number: [phone] 01/23/2019, 11:58 AM   Clinical Narrative:    Pt s/p right AKA 4/13- CIR consulted for possible admission with SNF as backup. Received notice from [facility] with CIR that pt has been approved by insurance for CIR and they have bed available today to admit. Pt has been cleared by attending team to transition to CIR today.   Final next level of care: IP Rehab Facility Barriers to Discharge: No Barriers Identified   Patient Goals and CMS Choice Patient states their goals for this hospitalization and ongoing recovery are:: want to go home CMS Medicare.gov Compare Post Acute Care list provided to:: Patient Choice offered to / list presented to : Patient  Discharge Placement  Plan to go to [facility] IP rehab prior to returning home.                      Discharge Plan and Services In-house Referral: Clinical Social Work Discharge Planning Services: CM Consult Post Acute Care Choice: IP Rehab                    Social Determinants of Health (SDOH) Interventions     Readmission Risk Interventions Readmission Risk Prevention Plan 01/23/2019  Transportation Screening Complete  PCP or Specialist Appt within 3-5 Days Complete  HRI or Home Care Consult Complete  Social Work Consult for Recovery Care Planning/Counseling Complete  Palliative Care Screening Not Applicable  Medication Review [employer]) Complete  Some recent data might be hidden

## 2019-01-23 NOTE — Progress Notes (Addendum)
Patient discharged to Inpatient Rehab..  Report given to [place], RN [phone]).  Pt informed of transfer to 4W 17.  Dose of IV Zosyn started prior to transfer. Updated POA (nephew) Marikay Alar of move to 4W.

## 2019-01-23 NOTE — Progress Notes (Addendum)
Inpatient Rehab Admissions Coordinator:    I have insurance authorization and a bed available for pt to admit to inpatient rehab today.  I await confirmation from Dr. Darrick Penna that pt can admit and will update CM, RN, and pt at that time.    Addendum: I have MD approval and will plan for admission today.  I will alert RN, CM, and pt/family.  Estill Dooms, PT, DPT Admissions Coordinator [phone] 01/23/19  9:57 AM

## 2019-01-23 NOTE — Care Management Important Message (Signed)
Important Message  Patient Details  Name: Danielle Buchanan MRN: [id number] Date of Birth: 07-10-1956   Medicare Important Message Given:  Yes    Dorena Bodo 01/23/2019, 3:15 PM

## 2019-01-23 NOTE — Progress Notes (Signed)
Meredith Staggers, MD  Physician  Physical Medicine and Rehabilitation  PMR Pre-admission  Signed  Date of Service:  01/23/2019 11:42 AM       Related encounter: ED to Hosp-Admission (Current) from 01/17/2019 in [facility] 4E CV SURGICAL PROGRESSIVE CARE      Signed         Show:Clear all '[x]' Manual'[x]' Template'[x]' Copied  Added by: '[x]' Meredith Staggers, MD'[x]' Michel Santee, PT  '[]' Hover for details PMR Admission Coordinator Pre-Admission Assessment  Patient: Danielle Buchanan is an 63 y.o., female MRN: [id number] DOB: 1956-01-17 Height: '5\' 2"'  (157.5 cm) Weight: 92.5 kg  Insurance Information HMO: yes    PPO:      PCP:      IPA:      80/20:      OTHER:  PRIMARY: [employer]      Policy#: [id number]      Subscriber: patient CM Name: Jenny Reichmann      Phone#: [phone]     Fax#: [phone] Pre-Cert#: [id number], automatic approval for CIR from [employer].  CM will contact when updates are due.      Employer:  Benefits:  Phone #: [phone]     Name:  Eff. Date: 10/08/2018     Deduct: $0      Out of Pocket Max: $3400 (met [phone])      Life Max: n/a CIR: $295/day for days 1-6      SNF: $0/days 1-20, $178/days 21-100 Outpatient:      Co-Pay: $10-$40 Home Health: 100%      Co-Pay:  DME: 80%     Co-Pay: 20%  SECONDARY:       Policy#:       Subscriber:  CM Name:       Phone#:      Fax#:  Pre-Cert#:       Employer:  Benefits:  Phone #:      Name:  Eff. Date:      Deduct:       Out of Pocket Max:       Life Max:  CIR:       SNF:  Outpatient:      Co-Pay:  Home Health:       Co-Pay:  DME:      Co-Pay:   Medicaid Application Date:       Case Manager:  Disability Application Date:       Case Worker:   The "Data Collection Information Summary" for patients in Inpatient Rehabilitation Facilities with attached "Privacy Act [place] Records" was provided and verbally reviewed with: Patient and Family  Emergency Contact Information         Contact Information    Name  Relation Home Work Mobile   [place]   [phone]   Richardson,Lisa Sister [phone]        Current Medical History  Patient Admitting Diagnosis: R BKA  History of Present Illness: VIRDA BETTERS is a 63 year old right-handed female with history of type 2 diabetes mellitus,CAD,tobacco abuse,TBI with memory deficits hypertension, left AKA 2018 as well as recent right fourth toe amputation 11/11/2018 and had been discharged to skilled nursing facility before returning home.  Presented 01/19/2019 with gangrenous right foot ischemic changes. Limb was not felt to be salvageable and underwent right BKA 01/19/2019 per Dr. Oneida Alar. Hospital course pain management. Stump sock has been applied. Subcutaneous heparin for DVT prophylaxis. Blood sugars closely monitored maintained on insulin therapy.    Patient's medical record from [facility] has been reviewed  by the rehabilitation admission coordinator and physician.  Past Medical History      Past Medical History:  Diagnosis Date  . Allergic rhinitis   . Depression   . Diabetes ([place])   . Dyslipidemia   . HTN (hypertension)   . Insomnia   . OSA (obstructive sleep apnea)    no cpap  . Urinary incontinence     Family History   family history includes Alcohol abuse in her father and mother; Heart attack in her brother, father, mother, and sister; Hypercholesterolemia in her sister.  Prior Rehab/Hospitalizations Has the patient had prior rehab or hospitalizations prior to admission? No  Has the patient had major surgery during 100 days prior to admission? Yes             Current Medications  Current Facility-Administered Medications:  .  0.9 %  sodium chloride infusion, 250 mL, Intravenous, PRN, Theda Sers, Emma M, PA-C .  0.9 %  sodium chloride infusion, 250 mL, Intravenous, PRN, Theda Sers, Emma M, PA-C .  acetaminophen (TYLENOL) tablet 325-650 mg, 325-650 mg, Oral, Q4H PRN **OR**  acetaminophen (TYLENOL) suppository 325-650 mg, 325-650 mg, Rectal, Q4H PRN, Theda Sers, Emma M, PA-C .  alum & mag hydroxide-simeth (MAALOX/MYLANTA) 200-200-20 MG/5ML suspension 15-30 mL, 15-30 mL, Oral, Q2H PRN, Theda Sers, Emma M, PA-C .  amitriptyline (ELAVIL) tablet 25 mg, 25 mg, Oral, QHS, Collins, Emma M, PA-C, 25 mg at 01/22/19 2121 .  aspirin EC tablet 81 mg, 81 mg, Oral, Daily, Laurence Slate M, PA-C, 81 mg at 01/23/19 1001 .  collagenase (SANTYL) ointment 1 application, 1 application, Topical, Daily, Ulyses Amor, PA-C, 1 application at 66/59/93 1415 .  docusate sodium (COLACE) capsule 100 mg, 100 mg, Oral, BID, Laurence Slate M, PA-C, 100 mg at 01/22/19 5701 .  gabapentin (NEURONTIN) capsule 300 mg, 300 mg, Oral, QHS, Collins, Emma M, PA-C, 300 mg at 01/22/19 2121 .  guaiFENesin-dextromethorphan (ROBITUSSIN DM) 100-10 MG/5ML syrup 15 mL, 15 mL, Oral, Q4H PRN, Theda Sers, Emma M, PA-C .  heparin injection 5,000 Units, 5,000 Units, Subcutaneous, Q8H, Ulyses Amor, [place], 5,000 Units at 01/23/19 [phone] .  hydrALAZINE (APRESOLINE) injection 5 mg, 5 mg, Intravenous, Q20 Min PRN, Collins, Emma M, PA-C .  insulin aspart (novoLOG) injection 0-20 Units, 0-20 Units, Subcutaneous, TID WC, Collins, Emma M, PA-C, 7 Units at 01/23/19 [phone] .  insulin aspart protamine- aspart (NOVOLOG MIX 70/30) injection 60 Units, 60 Units, Subcutaneous, Q supper, Ulyses Amor, [place], 60 Units at 01/22/19 1715 .  insulin aspart protamine- aspart (NOVOLOG MIX 70/30) injection 70 Units, 70 Units, Subcutaneous, Q breakfast, Ulyses Amor, [place], 70 Units at 01/23/19 0827 .  labetalol (NORMODYNE,TRANDATE) injection 10 mg, 10 mg, Intravenous, Q10 min PRN, Ulyses Amor, PA-C, 10 mg at 01/23/19 0923 .  lactated ringers infusion, , Intravenous, Continuous, Ulyses Amor, [place], Last Rate: 10 mL/hr at 01/19/19 0957 .  LORazepam (ATIVAN) tablet 0.5 mg, 0.5 mg, Oral, BID PRN, Laurence Slate M, PA-C .  losartan (COZAAR) tablet 100  mg, 100 mg, Oral, Daily, Laurence Slate M, PA-C, 100 mg at 01/23/19 1001 .  magnesium sulfate IVPB 2 g 50 mL, 2 g, Intravenous, Daily PRN, Theda Sers, Emma M, PA-C .  metFORMIN (GLUCOPHAGE-XR) 24 hr tablet 500 mg, 500 mg, Oral, Q breakfast, Collins, Emma M, PA-C .  metoprolol tartrate (LOPRESSOR) injection 2-5 mg, 2-5 mg, Intravenous, Q2H PRN, Theda Sers, Emma M, PA-C .  morphine 2 MG/ML injection 2-5 mg, 2-5 mg, Intravenous, Q1H PRN, Laurence Slate  M, PA-C, 4 mg at 01/21/19 2330 .  multivitamin with minerals tablet 1 tablet, 1 tablet, Per Tube, Daily, Collins, Emma M, PA-C, 1 tablet at 01/23/19 1001 .  nicotine polacrilex (NICORETTE) gum 2 mg, 2 mg, Oral, Daily PRN, Laurence Slate M, PA-C, 2 mg at 01/20/19 1012 .  ondansetron (ZOFRAN) injection 4 mg, 4 mg, Intravenous, Q6H PRN, Theda Sers, Emma M, PA-C .  oxyCODONE (Oxy IR/ROXICODONE) immediate release tablet 5-10 mg, 5-10 mg, Oral, Q4H PRN, Ulyses Amor, PA-C, 10 mg at 01/21/19 2043 .  pantoprazole (PROTONIX) EC tablet 40 mg, 40 mg, Oral, Daily, Laurence Slate M, PA-C, 40 mg at 01/23/19 1001 .  phenol (CHLORASEPTIC) mouth spray 1 spray, 1 spray, Mouth/Throat, PRN, Collins, Emma M, PA-C .  piperacillin-tazobactam (ZOSYN) IVPB 3.375 g, 3.375 g, Intravenous, Q8H, Collins, Emma M, [place], Last Rate: 12.5 mL/hr at 01/23/19 0639, 3.375 g at 01/23/19 0639 .  polyethylene glycol (MIRALAX / GLYCOLAX) packet 17 g, 17 g, Oral, Daily PRN, Theda Sers, Emma M, PA-C .  potassium chloride SA (K-DUR,KLOR-CON) CR tablet 20-40 mEq, 20-40 mEq, Oral, Once, Collins, Emma M, PA-C .  potassium chloride SA (K-DUR,KLOR-CON) CR tablet 20-40 mEq, 20-40 mEq, Oral, Daily PRN, Theda Sers, Emma M, PA-C .  pravastatin (PRAVACHOL) tablet 40 mg, 40 mg, Oral, Daily, Laurence Slate M, PA-C, 40 mg at 01/23/19 1001 .  sertraline (ZOLOFT) tablet 100 mg, 100 mg, Oral, Daily, Laurence Slate M, PA-C, 100 mg at 01/23/19 1001 .  sodium chloride flush (NS) 0.9 % injection 3 mL, 3 mL, Intravenous, Q12H, Collins,  Emma M, PA-C, 3 mL at 01/23/19 0955 .  sodium chloride flush (NS) 0.9 % injection 3 mL, 3 mL, Intravenous, PRN, Theda Sers, Emma M, PA-C .  sodium chloride flush (NS) 0.9 % injection 3 mL, 3 mL, Intravenous, Q12H, Collins, Emma M, PA-C, 3 mL at 01/23/19 1004 .  sodium chloride flush (NS) 0.9 % injection 3 mL, 3 mL, Intravenous, PRN, Laurence Slate M, PA-C, 3 mL at 01/23/19 1003 .  traZODone (DESYREL) tablet 50 mg, 50 mg, Oral, QHS, Collins, Emma M, PA-C, 50 mg at 01/22/19 2122 .  zinc sulfate capsule 220 mg, 220 mg, Oral, Daily, Laurence Slate M, PA-C, 220 mg at 01/23/19 1000  Patients Current Diet:     Diet Order                  Diet Carb Modified Fluid consistency: Thin; Room service appropriate? Yes with Assist  Diet effective now               Precautions / Restrictions Precautions Precautions: Fall Precaution Comments: now bilateral amputee with change to CoG causing increased posterior lean Restrictions Weight Bearing Restrictions: Yes RLE Weight Bearing: Non weight bearing LLE Weight Bearing: Non weight bearing   Has the patient had 2 or more falls or a fall with injury in the past year? No  Prior Activity Level Limited Community (1-2x/wk): not driving, limited community access for errands/appointments, w/c level  Prior Functional Level Self Care: Did the patient need help bathing, dressing, using the toilet or eating? Needed some help for transfers in/out of tub  Indoor Mobility: Did the patient need assistance with walking from room to room (with or without device)? Independent w/c mobility  Stairs: Did the patient need assistance with internal or external stairs (with or without device)? Dependent , pt is w/c level at baseline  Functional Cognition: Did the patient need help planning regular tasks such as shopping or remembering to  take medications? Needed some help  Home Assistive Devices / Equipment Home Assistive Devices/Equipment: Grab bars in  shower, Hand-held shower hose, Long-handled sponge, Raised toilet seat with rails, Shower chair with back, Wheelchair Home Equipment: Tub bench, Grab bars - tub/shower, Hand held shower head, Wheelchair - manual  Prior Device Use: Indicate devices/aids used by the patient prior to current illness, exacerbation or injury? Manual wheelchair  Current Functional Level Cognition  Overall Cognitive Status: Within Functional Limits for tasks assessed Orientation Level: Oriented X4 General Comments: Reports she had to have dog Mango put down in 2023-09-26 and her sister died last 03-27-23, depressed     Extremity Assessment (includes Sensation/Coordination)  Upper Extremity Assessment: Generalized weakness  Lower Extremity Assessment: RLE deficits/detail, LLE deficits/detail RLE Deficits / Details: new BKA, increased pain and decreased knee extension  RLE: Unable to fully assess due to pain LLE Deficits / Details: prior AKA     ADLs  Overall ADL's : Needs assistance/impaired Eating/Feeding: Independent, Sitting Grooming: Set up, Sitting, Wash/dry hands, Wash/dry face, Oral care Upper Body Bathing: Minimal assistance, Sitting Lower Body Bathing: Moderate assistance, Bed level, Sitting/lateral leans Upper Body Dressing : Minimal assistance, Sitting Lower Body Dressing: Maximal assistance, Sitting/lateral leans, Bed level Toilet Transfer: Moderate assistance, +2 for physical assistance, +2 for safety/equipment Toilet Transfer Details (indicate cue type and reason): Mod A +2 for posterior transfer to recliner. Using bed pads to facilitate hips back into the chair. Cues for sequencing.  Functional mobility during ADLs: Moderate assistance, +2 for physical assistance, +2 for safety/equipment General ADL Comments: Worked at bed level on pulling trunk from semi reclined into sitting using B bed rails followed by sitting balance reaching within BoS in long sitting.    Mobility  Overal bed mobility:  Needs Assistance Bed Mobility: Supine to Sit Supine to sit: Mod assist General bed mobility comments: mod assist to raise trunk    Transfers  Overall transfer level: Needs assistance Equipment used: None Transfers: Lateral/Scoot Transfers Anterior-Posterior transfers: +2 physical assistance, Mod assist  Lateral/Scoot Transfers: Mod assist General transfer comment: pt somewhat inefficient, did not use transfer board this visit therefore requiring more assistance with use of bed pad    Ambulation / Gait / Stairs / Wheelchair Mobility    unable   Posture / Balance Dynamic Sitting Balance Sitting balance - Comments: cannot accept challenges to static sitting balance Balance Overall balance assessment: Needs assistance Sitting-balance support: Bilateral upper extremity supported(bilateral residual limbs supported) Sitting balance-Leahy Scale: Fair Sitting balance - Comments: cannot accept challenges to static sitting balance Postural control: Posterior lean    Special needs/care consideration BiPAP/CPAP no CPM no Continuous Drip IV zosyn Dialysis no        Days n/a Life Vest no Oxygen on 2L via Nasal Cannula occasionally while in acute care Special Bed no Trach Size no Wound Vac (area) no      Location n/a Skin surgical incision to RLE                            Bowel mgmt: incontinent, last BM on 01/22/2019 Bladder mgmt: incontinent Diabetic mgmt: yes, novolog Behavioral consideration no Chemo/radiation no   Previous Home Environment (from acute therapy documentation) Living Arrangements: Alone Available Help at Discharge: Family, Available PRN/intermittently(niece and nephew help out ) Type of Home: Apartment Home Layout: One level Home Access: Level entry Bathroom Shower/Tub: Chiropodist: Handicapped height Bathroom Accessibility: Yes Home Care Services: Yes  Type of Home Care Services: Other (Comment)(Family and friends supportative) [place] (if known): [facility]  Discharge Living Setting Plans for Discharge Living Setting: Patient's home Type of Home at Discharge: Apartment Discharge Home Layout: One level Discharge Home Access: Level entry Discharge Bathroom Shower/Tub: Tub/shower unit Discharge Bathroom Toilet: Handicapped height Discharge Bathroom Accessibility: Yes How Accessible: Accessible via wheelchair Does the patient have any problems obtaining your medications?: No  Social/Family/Support Systems Anticipated Caregiver: pt's nephew, [name] Anticipated Caregiver's Contact Information: [phone] Ability/Limitations of Caregiver: states he will arrange 24/7 if needed at discharge Caregiver Availability: 24/7 Discharge Plan Discussed with Primary Caregiver: Yes Is Caregiver In Agreement with Plan?: Yes Does Caregiver/Family have Issues with Lodging/Transportation while Pt is in Rehab?: No  Goals/Additional Needs Patient/Family Goal for Rehab: PT/OT mod I Expected length of stay: 7-10 days Dietary Needs: carb modified, thin Equipment Needs: slide board Pt/Family Agrees to Admission and willing to participate: Yes Program Orientation Provided & Reviewed with Pt/Caregiver Including Roles  & Responsibilities: Yes   Possible need for SNF placement upon discharge: not anticipated  Patient Condition: I have reviewed medical records from [facility], spoken with CM, and patient and family member. I met with patient at the bedside and discussed via phone for inpatient rehabilitation assessment.  Patient will benefit from ongoing PT and OT, can actively participate in 3 hours of therapy a day 5 days of the week, and can make measurable gains during the admission.  Patient will also benefit from the coordinated team approach during an Inpatient Acute Rehabilitation admission.  The patient will receive intensive therapy as well as Rehabilitation physician, nursing, social worker, and care  management interventions.  Due to bladder management, bowel management, safety, skin/wound care, disease management, medication administration, pain management and patient education the patient requires 24 hour a day rehabilitation nursing.  The patient is currently min assist with mobility and basic ADLs.  Discharge setting and therapy post discharge at home with home health is anticipated.  Patient has agreed to participate in the Acute Inpatient Rehabilitation Program and will admit today.  Preadmission Screen Completed By:  Michel Santee, 01/23/2019 11:52 AM ______________________________________________________________________   Discussed status with Dr. Naaman Plummer on 01/23/19  at 11:52 AM  and received approval for admission today.  Admission Coordinator:  Michel Santee, PT, time 11:52 AM Sudie Grumbling 01/23/19    Assessment/Plan: Diagnosis: right bka 1. Does the need for close, 24 hr/day Medical supervision in concert with the patient's rehab needs make it unreasonable for this patient to be served in a less intensive setting? Yes 2. Co-Morbidities requiring supervision/potential complications: DM, CAD, obesity, prior left AKA 3. Due to bladder management, bowel management, safety, skin/wound care, disease management, medication administration, pain management and patient education, does the patient require 24 hr/day rehab nursing? Yes 4. Does the patient require coordinated care of a physician, rehab nurse, PT (1-2 hrs/day, 5 days/week) and OT (1-2 hrs/day, 5 days/week) to address physical and functional deficits in the context of the above medical diagnosis(es)? Yes Addressing deficits in the following areas: balance, endurance, locomotion, strength, transferring, bowel/bladder control, bathing, dressing, feeding, grooming, toileting and psychosocial support 5. Can the patient actively participate in an intensive therapy program of at least 3 hrs of therapy 5 days a week? Yes 6. The potential  for patient to make measurable gains while on inpatient rehab is excellent 7. Anticipated functional outcomes upon discharge from inpatients are: modified independent PT, modified independent OT, n/a SLP at  w/c level.  8. Estimated rehab length of stay to reach the above functional goals is: 7-10 days 9. Anticipated D/C setting: Home 10. Anticipated post D/C treatments: [place] therapy 11. Overall Rehab/Functional Prognosis: excellent  MD Signature: Meredith Staggers, MD, [facility] 01/23/2019         Revision History

## 2019-01-23 NOTE — Progress Notes (Signed)
Skin assessment performed on patient this morning, Pt able to roll and skin on buttocks intact. Pt to be transferred to inpatient Rehab, provider Harvel Ricks, [facility] [phone] notified of cloudy urine this morning. Provider made aware. Pt does not report pain with voiding, urine not foul smelling.

## 2019-01-24 ENCOUNTER — Inpatient Hospital Stay (HOSPITAL_COMMUNITY): Payer: Medicare HMO | Admitting: Physical Therapy

## 2019-01-24 ENCOUNTER — Inpatient Hospital Stay (HOSPITAL_COMMUNITY): Payer: Medicare HMO

## 2019-01-24 LAB — GLUCOSE, CAPILLARY
Glucose-Capillary: 117 mg/dL — ABNORMAL HIGH (ref 70–99)
Glucose-Capillary: 74 mg/dL (ref 70–99)
Glucose-Capillary: 171 mg/dL — ABNORMAL HIGH (ref 70–99)
Glucose-Capillary: 159 mg/dL — ABNORMAL HIGH (ref 70–99)

## 2019-01-24 NOTE — Evaluation (Signed)
Occupational Therapy Assessment and Plan  Patient Details  Name: Danielle Buchanan MRN: [id number] Date of Birth: 24-Nov-1955  OT Diagnosis: abnormal posture, cognitive deficits and muscle weakness (generalized) Rehab Potential:   ELOS: 10-14   Today's Date: 01/24/2019 OT Individual Time: 7782-4235 OT Individual Time Calculation (min): 75 min     Problem List:  Patient Active Problem List   Diagnosis Date Noted  . Right below-knee amputee ([place]) 01/23/2019  . Gangrene of right foot ([place]) 01/17/2019  . History of transmetatarsal amputation of right foot ([place]) 12/13/2018  . At risk for adverse drug event 11/27/2018  . Osteomyelitis of third toe of right foot ([place]) 11/12/2018  . Pain in finger of right hand 02/20/2018  . Atherosclerosis of native arteries of the extremities with gangrene ([place]) 11/01/2017  . Unilateral AKA, left ([place])   . Post-operative pain   . OSA (obstructive sleep apnea)   . Benign essential HTN   . DM (diabetes mellitus), secondary, uncontrolled, with peripheral vascular complications ([place])   . Hyperkalemia   . Leukocytosis   . Acute blood loss anemia   . Amputation stump infection ([place]) 08/23/2017  . Amputation of left lower extremity above knee upon examination ([place]) 08/23/2017  . PAD (peripheral artery disease) ([place]) 07/23/2017  . Cellulitis 06/10/2017  . Hyponatremia 06/10/2017  . Fever 06/10/2017  . Rash 05/24/2017  . HTN (hypertension)   . Allergic rhinitis   . Insomnia   . Depression     Past Medical History:  Past Medical History:  Diagnosis Date  . Allergic rhinitis   . Depression   . Diabetes ([place])   . Dyslipidemia   . HTN (hypertension)   . Insomnia   . OSA (obstructive sleep apnea)    no cpap  . Urinary incontinence    Past Surgical History:  Past Surgical History:  Procedure Laterality Date  . ABDOMINAL AORTOGRAM W/LOWER EXTREMITY N/A 06/24/2017   Procedure: ABDOMINAL AORTOGRAM W/LOWER EXTREMITY;  Surgeon: Waynetta Sandy, MD;  Location: [place] [facility];  Service: Cardiovascular;  Laterality: N/A;  . ABDOMINAL AORTOGRAM W/LOWER EXTREMITY Right 10/09/2017   Procedure: ABDOMINAL AORTOGRAM W/LOWER EXTREMITY;  Surgeon: Waynetta Sandy, MD;  Location: [place] [facility];  Service: Cardiovascular;  Laterality: Right;  . AMPUTATION Left 07/23/2017   Procedure: AMPUTATION  BELOW KNEE;  Surgeon: Waynetta Sandy, MD;  Location: [place];  Service: Vascular;  Laterality: Left;  . AMPUTATION Left 08/12/2017   Procedure: REVISION BELOW KNEE;  Surgeon: Waynetta Sandy, MD;  Location: [place];  Service: Vascular;  Laterality: Left;  . AMPUTATION Left 08/27/2017   Procedure: left ABOVE KNEE AMPUTATION;  Surgeon: Waynetta Sandy, MD;  Location: [place];  Service: Vascular;  Laterality: Left;  . AMPUTATION Left 08/30/2017   Procedure: REVISION AMPUTATION ABOVE KNEE;  Surgeon: Serafina Mitchell, MD;  Location: [place];  Service: Vascular;  Laterality: Left;  . AMPUTATION Right 11/04/2017   Procedure: AMPUTATION RIGHT FOURTH TOE;  Surgeon: Waynetta Sandy, MD;  Location: [place];  Service: Vascular;  Laterality: Right;  . AMPUTATION Right 11/12/2018   Procedure: AMPUTATION OF second, third and fifth toes;  Surgeon: Serafina Mitchell, MD;  Location: [place];  Service: Vascular;  Laterality: Right;  . AMPUTATION Right 01/19/2019   Procedure: AMPUTATION BELOW KNEE;  Surgeon: Elam Dutch, MD;  Location: [place];  Service: Vascular;  Laterality: Right;  . APPLICATION OF WOUND VAC Left 08/27/2017   Procedure: APPLICATION OF WOUND VAC;  Surgeon: Waynetta Sandy, MD;  Location: [place];  Service: Vascular;  Laterality: Left;  . CARPAL TUNNEL RELEASE Right   . COLONOSCOPY    . LOWER EXTREMITY ANGIOGRAM Right 11/12/2018   Procedure: ANGIOGRAM OF Right LEG;  Surgeon: Serafina Mitchell, MD;  Location: [place];  Service: Vascular;  Laterality: Right;  . LOWER EXTREMITY INTERVENTION Right  10/09/2017   Procedure: LOWER EXTREMITY INTERVENTION;  Surgeon: Waynetta Sandy, MD;  Location: [place] [facility];  Service: Cardiovascular;  Laterality: Right;  . TOE AMPUTATION Right 11/12/2018   2ND 3RD & 5TH TOE   . TRANSMETATARSAL AMPUTATION Right 11/19/2018   Procedure: TRANSMETATARSAL AMPUTATION REVISION;  Surgeon: Serafina Mitchell, MD;  [facility];  Service: Vascular;  Laterality: Right;    Assessment & Plan Clinical Impression: Danielle Buchanan is a 63 year old right-handed female with history of type 2 diabetes mellitus,CAD,tobacco abuse,TBI with memory deficits hypertension, left AKA 2018 as well as recent right fourth toe amputation 11/11/2018 and had been discharged to skilled nursing facility before returning home. Patient lives alone in a handicapped accessible apartment and primarily uses a wheelchair prior to admission although she does have a prosthesis for left AKA with limited use after right foot infection. She has a niece and nephew who plans to provide assistance as needed on discharge. Presented 01/19/2019 with gangrenous right foot ischemic changes. Limb was not felt to be salvageable and underwent right BKA 01/19/2019 per Dr. Oneida Alar.Hospital course pain management. Stump sock has been applied. Subcutaneous heparin for DVT prophylaxis. Blood sugars closely monitored maintained on insulin therapy. Therapy evaluations completed and patient was admitted for a comprehensive rehabilitation program.  Patient currently requires mod with basic self-care skills secondary to muscle weakness, decreased cardiorespiratoy endurance, decreased awareness, decreased problem solving, decreased safety awareness and decreased memory and decreased sitting balance, decreased postural control and decreased balance strategies.  Prior to hospitalization, patient could complete BADL with modified independent .  Patient will benefit from skilled intervention to increase independence with  basic self-care skills prior to discharge home with care partner.  Anticipate patient will require intermittent supervision and follow up home health.  OT - End of Session Activity Tolerance: Tolerates 30+ min activity with multiple rests Endurance Deficit: Yes OT Assessment Rehab Potential (ACUTE ONLY): Good OT Barriers to Discharge: Decreased caregiver support;Lack of/limited family support OT Barriers to Discharge Comments: family only avaiable PRN OT Patient demonstrates impairments in the following area(s): Balance;Cognition;Endurance;Motor;Pain;Safety;Skin Integrity OT Basic ADL's Functional Problem(s): Grooming;Bathing;Dressing;Toileting OT Advanced ADL's Functional Problem(s): Simple Meal Preparation OT Transfers Functional Problem(s): Toilet;Tub/Shower OT Plan OT Intensity: Minimum of 1-2 x/day, 45 to 90 minutes OT Frequency: 5 out of 7 days OT Duration/Estimated Length of Stay: 10-14 OT Treatment/Interventions: Balance/vestibular training;Disease mangement/prevention;Neuromuscular re-education;Self Care/advanced ADL retraining;Therapeutic Exercise;Wheelchair propulsion/positioning;Cognitive remediation/compensation;Pain management;DME/adaptive equipment instruction;Skin care/wound managment;UE/LE Strength taining/ROM;Community reintegration;Patient/family education;Splinting/orthotics;UE/LE Coordination activities;Discharge planning;Functional mobility training;Psychosocial support;Therapeutic Activities;Visual/perceptual remediation/compensation OT Self Feeding Anticipated Outcome(s): MOD I OT Basic Self-Care Anticipated Outcome(s): MOD I OT Toileting Anticipated Outcome(s): MOD I toilet; S shower OT Bathroom Transfers Anticipated Outcome(s): MOD I toilet; S shower OT Recommendation Patient destination: Home Follow Up Recommendations: Home health OT Equipment Recommended: Other (comment) Equipment Details: [facility]   Skilled Therapeutic Intervention 1:1. Pt received in bed with no  c/o pian. Pt educated on role/purpose of OT, CIR, ELOS, POC, limb loss healing, desensitization techaniues as well as figure 8 wrapping. Pt completes supine>sitting with MOD A for trunk elevation. Pt completes UB bathing and dressing with min A  for sitting balace, LB dressing with mod A overall for sitting balnace and donning brief. VC for lateral lean techaniue provided. Pt demo core weakness and delayed protective responses impacting postural control during lateral leans. Pt completes MAX A slide board transfer EOB>w/c with VC for weight shift forwar dand hand placement. Pt completes oral care and hair care at sink with set up. Pt OT ace wraps RLE in figure 8 with light pressure d/t sensitivity. Exited session with pt seated in w/c, call light tin reach and all needs met. Pt does not have belt alarm in room. RN alerted.  OT Evaluation Precautions/Restrictions  Precautions Precautions: Fall Precaution Comments: now bilateral amputee with change to CoG causing increased posterior lean Restrictions Weight Bearing Restrictions: Yes RLE Weight Bearing: Non weight bearing LLE Weight Bearing: Non weight bearing General Chart Reviewed: Yes Family/Caregiver Present: No Vital Signs Therapy Vitals Temp: 97.8 F (36.6 C) Temp Source: Oral Pulse Rate: 66 Resp: 16 BP: (!) 162/73 Patient Position (if appropriate): Lying Oxygen Therapy SpO2: 96 % O2 Device: Room Air Pain Pain Assessment Pain Score: 0-No pain Home Living/Prior Functioning Home Living Family/patient expects to be discharged to:: Unsure Living Arrangements: Alone Available Help at Discharge: Available PRN/intermittently Type of Home: Apartment Home Access: Level entry Home Layout: One level Bathroom Shower/Tub: Chiropodist: Handicapped height Bathroom Accessibility: Yes Prior Function Level of Independence: Independent with basic ADLs(A for IADLs and showering) ADL ADL Grooming: Setup Upper Body  Bathing: Supervision/safety Where Assessed-Upper Body Bathing: Edge of bed Lower Body Bathing: Minimal assistance Where Assessed-Lower Body Bathing: Edge of bed Upper Body Dressing: Minimal assistance(sitting balance) Where Assessed-Upper Body Dressing: Edge of bed Lower Body Dressing: Moderate assistance Toileting: Not assessed Tub/Shower Transfer: Not assessed Vision Baseline Vision/History: No visual deficits Patient Visual Report: No change from baseline Vision Assessment?: No apparent visual deficits Perception  Perception: Within Functional Limits Praxis Praxis: Intact Cognition Orientation Level: Person;Situation;Place Person: Oriented Place: Oriented Situation: Oriented Year: 2020 Month: April Day of Week: Correct Memory: Appears intact Immediate Memory Recall: Sock;Blue;Bed Memory Recall: Sock;Blue;Bed Memory Recall Sock: Without Cue Memory Recall Blue: Without Cue Memory Recall Bed: Without Cue Attention: Selective Selective Attention: Appears intact Awareness: Impaired Awareness Impairment: Anticipatory impairment Safety/Judgment: Impaired Sensation Sensation Light Touch: Appears Intact Coordination Gross Motor Movements are Fluid and Coordinated: Yes Fine Motor Movements are Fluid and Coordinated: No Motor  Motor Motor - Discharge Observations: generalized weakness Mobility  Bed Mobility Bed Mobility: Supine to Sit Supine to Sit: Moderate Assistance - Patient 50-74%  Trunk/Postural Assessment  Cervical Assessment Cervical Assessment: Exceptions to WFL(head forward) Thoracic Assessment Thoracic Assessment: Exceptions to WFL(rounded shoulders) Lumbar Assessment Lumbar Assessment: Exceptions to WFL(posterior pelvic tilt) Postural Control Postural Control: Deficits on evaluation(delayed)  Balance Balance Balance Assessed: Yes Static Sitting Balance Static Sitting - Balance Support: No upper extremity supported Static Sitting - Level of  Assistance: 5: Stand by assistance Dynamic Sitting Balance Dynamic Sitting - Balance Support: No upper extremity supported Dynamic Sitting - Level of Assistance: 4: Min assist Dynamic Sitting Balance - Compensations: lateral leans Extremity/Trunk Assessment RUE Assessment RUE Assessment: Exceptions to [facility] General Strength Comments: generalized weakness LUE Assessment LUE Assessment: Exceptions to [facility] General Strength Comments: generalized wekaness     Refer to Care Plan for Long Term Goals  Recommendations for other services: Therapeutic Recreation  Pet therapy   Discharge Criteria: Patient will be discharged from OT if patient refuses treatment 3 consecutive times without medical reason, if treatment goals not met, if there  is a change in medical status, if patient makes no progress towards goals or if patient is discharged from hospital.  The above assessment, treatment plan, treatment alternatives and goals were discussed and mutually agreed upon: by patient  Tonny Branch 01/24/2019, 7:57 AM

## 2019-01-24 NOTE — Evaluation (Signed)
Physical Therapy Assessment and Plan  Patient Details  Name: Danielle Buchanan MRN: [id number] Date of Birth: Nov 25, 1955  PT Diagnosis: Impaired sensation, Muscle weakness and Pain in residual limb Rehab Potential: Good ELOS: 10-12 days   Today's Date: 01/24/2019 PT Individual Time: 0900-1000 PT Individual Time Calculation (min): 60 min    Problem List:  Patient Active Problem List   Diagnosis Date Noted  . Right below-knee amputee ([place]) 01/23/2019  . Gangrene of right foot ([place]) 01/17/2019  . History of transmetatarsal amputation of right foot ([place]) 12/13/2018  . At risk for adverse drug event 11/27/2018  . Osteomyelitis of third toe of right foot ([place]) 11/12/2018  . Pain in finger of right hand 02/20/2018  . Atherosclerosis of native arteries of the extremities with gangrene ([place]) 11/01/2017  . Unilateral AKA, left ([place])   . Post-operative pain   . OSA (obstructive sleep apnea)   . Benign essential HTN   . DM (diabetes mellitus), secondary, uncontrolled, with peripheral vascular complications ([place])   . Hyperkalemia   . Leukocytosis   . Acute blood loss anemia   . Amputation stump infection ([place]) 08/23/2017  . Amputation of left lower extremity above knee upon examination ([place]) 08/23/2017  . PAD (peripheral artery disease) ([place]) 07/23/2017  . Cellulitis 06/10/2017  . Hyponatremia 06/10/2017  . Fever 06/10/2017  . Rash 05/24/2017  . HTN (hypertension)   . Allergic rhinitis   . Insomnia   . Depression     Past Medical History:  Past Medical History:  Diagnosis Date  . Allergic rhinitis   . Depression   . Diabetes ([place])   . Dyslipidemia   . HTN (hypertension)   . Insomnia   . OSA (obstructive sleep apnea)    no cpap  . Urinary incontinence    Past Surgical History:  Past Surgical History:  Procedure Laterality Date  . ABDOMINAL AORTOGRAM W/LOWER EXTREMITY N/A 06/24/2017   Procedure: ABDOMINAL AORTOGRAM W/LOWER EXTREMITY;  Surgeon: Waynetta Sandy, MD;   Location: [place] [facility];  Service: Cardiovascular;  Laterality: N/A;  . ABDOMINAL AORTOGRAM W/LOWER EXTREMITY Right 10/09/2017   Procedure: ABDOMINAL AORTOGRAM W/LOWER EXTREMITY;  Surgeon: Waynetta Sandy, MD;  Location: [place] [facility];  Service: Cardiovascular;  Laterality: Right;  . AMPUTATION Left 07/23/2017   Procedure: AMPUTATION  BELOW KNEE;  Surgeon: Waynetta Sandy, MD;  Location: [place];  Service: Vascular;  Laterality: Left;  . AMPUTATION Left 08/12/2017   Procedure: REVISION BELOW KNEE;  Surgeon: Waynetta Sandy, MD;  Location: [place];  Service: Vascular;  Laterality: Left;  . AMPUTATION Left 08/27/2017   Procedure: left ABOVE KNEE AMPUTATION;  Surgeon: Waynetta Sandy, MD;  Location: [place];  Service: Vascular;  Laterality: Left;  . AMPUTATION Left 08/30/2017   Procedure: REVISION AMPUTATION ABOVE KNEE;  Surgeon: Serafina Mitchell, MD;  Location: [place];  Service: Vascular;  Laterality: Left;  . AMPUTATION Right 11/04/2017   Procedure: AMPUTATION RIGHT FOURTH TOE;  Surgeon: Waynetta Sandy, MD;  Location: [place];  Service: Vascular;  Laterality: Right;  . AMPUTATION Right 11/12/2018   Procedure: AMPUTATION OF second, third and fifth toes;  Surgeon: Serafina Mitchell, MD;  Location: [place];  Service: Vascular;  Laterality: Right;  . AMPUTATION Right 01/19/2019   Procedure: AMPUTATION BELOW KNEE;  Surgeon: Elam Dutch, MD;  Location: [place];  Service: Vascular;  Laterality: Right;  . APPLICATION OF WOUND VAC Left 08/27/2017   Procedure: APPLICATION OF WOUND VAC;  Surgeon: Waynetta Sandy, MD;  Location: [place];  Service: Vascular;  Laterality: Left;  . CARPAL TUNNEL RELEASE Right   . COLONOSCOPY    . LOWER EXTREMITY ANGIOGRAM Right 11/12/2018   Procedure: ANGIOGRAM OF Right LEG;  Surgeon: Serafina Mitchell, MD;  Location: [place];  Service: Vascular;  Laterality: Right;  . LOWER EXTREMITY INTERVENTION Right 10/09/2017   Procedure:  LOWER EXTREMITY INTERVENTION;  Surgeon: Waynetta Sandy, MD;  Location: [place] [facility];  Service: Cardiovascular;  Laterality: Right;  . TOE AMPUTATION Right 11/12/2018   2ND 3RD & 5TH TOE   . TRANSMETATARSAL AMPUTATION Right 11/19/2018   Procedure: TRANSMETATARSAL AMPUTATION REVISION;  Surgeon: Serafina Mitchell, MD;  Location: [facility];  Service: Vascular;  Laterality: Right;    Assessment & Plan Clinical Impression: Patient is a 63 y.o. year old female with recent admission to the hospital for recent right fourth toe amputation 11/11/2018 and had been discharged to skilled nursing facility before returning home. Patient lives alone in a handicapped accessible apartment and primarily uses a wheelchair prior to admission although she does have a prosthesis for left AKA with limited use after right foot infection. She has a niece and nephew who plans to provide assistance as needed on discharge. Presented 01/19/2019 with gangrenous right foot ischemic changes. Limb was not felt to be salvageable and underwent right BKA 01/19/2019 per Dr. Oneida Alar.Hospital course pain management. Stump sock has been applied. Subcutaneous heparin for DVT prophylaxis. Blood sugars closely monitored maintained on insulin therapy..  Patient transferred to CIR on 01/23/2019 .   Patient currently requires max with mobility secondary to muscle weakness and decreased sitting balance, decreased postural control and decreased balance strategies.  Prior to hospitalization, patient was modified independent  with mobility and lived with Alone in a [place] home.  Home access is  Level entry.  Patient will benefit from skilled PT intervention to maximize safe functional mobility, minimize fall risk and decrease caregiver burden for planned discharge home with intermittent assist.  Anticipate patient will benefit from follow up [facility] at discharge.  PT - End of Session Activity Tolerance: Tolerates 30+ min activity with multiple  rests Endurance Deficit: Yes PT Assessment Rehab Potential (ACUTE/IP ONLY): Good PT Barriers to Discharge: Decreased caregiver support PT Patient demonstrates impairments in the following area(s): Balance;Edema;Endurance;Pain;Motor;Sensory;Skin Integrity PT Transfers Functional Problem(s): Bed Mobility;Bed to Chair;Car;Furniture PT Locomotion Functional Problem(s): Wheelchair Mobility PT Plan PT Intensity: Minimum of 1-2 x/day ,45 to 90 minutes PT Frequency: 5 out of 7 days PT Duration Estimated Length of Stay: 10-12 days PT Treatment/Interventions: Financial risk analyst;Neuromuscular re-education;UE/LE Strength taining/ROM;Wheelchair propulsion/positioning;Balance/vestibular training;Discharge planning;Pain management;Skin care/wound management;Therapeutic Activities;UE/LE Coordination activities;Therapeutic Exercise;Splinting/orthotics;Patient/family education;Functional mobility training  Skilled Therapeutic Intervention Pt participated in skilled PT eval and was educated on PT goals and POC. Pt performs sliding board transfers to Rt with mod A, to Lt with max A, cues for safety and assist for placing board.  Simulated car transfer to SUV height with max A.  Discussed possibility of pt wearing prosthesis on Lt LE to stand pivot for transfers, pt states she will have nephew bring prosthetic in.  Supine therex for AROM of Rt LE with quad sets with tactile cues, SLR, hip abd/add, SAQ.  Pt left in room with all needs at hand.  PT Evaluation Precautions/Restrictions Precautions Precautions: Fall Precaution Comments: now bilateral amputee with change to CoG causing increased posterior lean Restrictions Weight Bearing Restrictions: Yes RLE Weight Bearing: Non weight bearing  Pain Pain  Assessment Pain Scale: 0-10 Pain Score: 0-No pain Faces Pain Scale: Hurts little more Pain Type: Acute pain;Surgical pain Pain Location: Leg Pain Orientation:  Right Pain Descriptors / Indicators: Aching Pain Onset: On-going Pain Intervention(s): Repositioned;Distraction;Rest Home Living/Prior Functioning Home Living Available Help at Discharge: Available PRN/intermittently;Family Type of Home: Apartment Home Access: Level entry Home Layout: One level Bathroom Shower/Tub: Chiropodist: Handicapped height Bathroom Accessibility: Yes  Lives With: Alone Prior Function Level of Independence: Requires assistive device for independence  Able to Take Stairs?: No Driving: No Vocation: On disability Vision/Perception  Perception Perception: Within Functional Limits Praxis Praxis: Intact  Cognition Overall Cognitive Status: Within Functional Limits for tasks assessed Arousal/Alertness: Awake/alert Orientation Level: Oriented X4 Attention: Selective Selective Attention: Appears intact Memory: Appears intact Awareness: Impaired Awareness Impairment: Anticipatory impairment Safety/Judgment: Impaired Sensation Sensation Light Touch: Impaired Detail Light Touch Impaired Details: Impaired RLE;Impaired LUE;Impaired RUE Proprioception: Appears Intact Coordination Gross Motor Movements are Fluid and Coordinated: Yes Fine Motor Movements are Fluid and Coordinated: No Coordination and Movement Description: digits 3-5 numb on both hands Motor  Motor Motor - Skilled Clinical Observations: generalized weakness Motor - Discharge Observations: generalized weakness  Mobility Bed Mobility Bed Mobility: Supine to Sit Supine to Sit: Moderate Assistance - Patient 50-74% Transfers Transfers: Lateral/Scoot Transfers Lateral/Scoot Transfers: Maximal Assistance - Patient 25-49% Locomotion  Gait Ambulation: No Gait Gait: No Stairs / Additional Locomotion Stairs: No Architect: Yes Wheelchair Assistance: Chartered loss adjuster: Both upper extremities Distance: 120   Trunk/Postural Assessment  Cervical Assessment Cervical Assessment: (fwd head) Thoracic Assessment Thoracic Assessment: (rounded shoulders) Lumbar Assessment Lumbar Assessment: (posterior pelvic tilt) Postural Control Postural Control: Deficits on evaluation Righting Reactions: delayed  Balance Balance Balance Assessed: Yes Static Sitting Balance Static Sitting - Balance Support: No upper extremity supported Static Sitting - Level of Assistance: 5: Stand by assistance Dynamic Sitting Balance Dynamic Sitting - Balance Support: No upper extremity supported Dynamic Sitting - Level of Assistance: 4: Min assist Dynamic Sitting Balance - Compensations: lateral leans Extremity Assessment  RUE Assessment RUE Assessment: Exceptions to [facility] General Strength Comments: generalized weakness LUE Assessment LUE Assessment: Exceptions to [facility] General Strength Comments: generalized wekaness RLE Assessment General Strength Comments: grossly 3/5 LLE Assessment General Strength Comments: hip 3/5    Refer to Care Plan for Long Term Goals  Recommendations for other services: None   Discharge Criteria: Patient will be discharged from PT if patient refuses treatment 3 consecutive times without medical reason, if treatment goals not met, if there is a change in medical status, if patient makes no progress towards goals or if patient is discharged from hospital.  The above assessment, treatment plan, treatment alternatives and goals were discussed and mutually agreed upon: by patient  Cyd Hostler 01/24/2019, 9:58 AM

## 2019-01-24 NOTE — Progress Notes (Signed)
[facility] PHYSICAL MEDICINE & REHABILITATION PROGRESS NOTE   Subjective/Complaints: Had a reasonable night. Pain was tolerable. Able to sleep  ROS: Patient denies fever, rash, sore throat, blurred vision, nausea, vomiting, diarrhea, cough, shortness of breath or chest pain,   headache, or mood change.    Objective:   No results found. Recent Labs    01/23/19 1500  WBC 6.8  HGB 11.0*  HCT 32.9*  PLT 231   Recent Labs    01/23/19 1500  CREATININE 0.88    Intake/Output Summary (Last 24 hours) at 01/24/2019 1049 Last data filed at 01/24/2019 0825 Gross per 24 hour  Intake 240 ml  Output -  Net 240 ml     Physical Exam: Vital Signs Blood pressure (!) 162/73, pulse 66, temperature 97.8 F (36.6 C), temperature source Oral, resp. rate 16, height 5\' 3"  (1.6 m), weight 92.7 kg, SpO2 96 %. Constitutional: No distress . Vital signs reviewed. obese HEENT: EOMI, oral membranes moist Neck: supple Cardiovascular: RRR without murmur. No JVD    Respiratory: CTA Bilaterally without wheezes or rales. Normal effort    GI: BS +, non-tender, non-distended  Musculoskeletal:  Comments: Right BK remains tender to palpation, edematous Neurological: She is alertand oriented to person, place, and time. Nocranial nerve deficit.Coordinationnormal. Reasonable insight and awareness  Skin: Skin iswarm. AKA well-healed. Right BKA is CDI with staples. Psychiatric: pleasant     Assessment/Plan: 1. Functional deficits secondary to right BKA which require 3+ hours per day of interdisciplinary therapy in a comprehensive inpatient rehab setting.  Physiatrist is providing close team supervision and 24 hour management of active medical problems listed below.  Physiatrist and rehab team continue to assess barriers to discharge/monitor patient progress toward functional and medical goals  Care Tool:  Bathing    Body parts bathed by patient: Right arm, Left arm, Chest, Abdomen, Front  perineal area, Buttocks, Right upper leg, Left upper leg, Face     Body parts n/a: Right lower leg, Left lower leg   Bathing assist Assist Level: Minimal Assistance - Patient > 75%     Upper Body Dressing/Undressing Upper body dressing   What is the patient wearing?: Pull over shirt    Upper body assist Assist Level: Minimal Assistance - Patient > 75%    Lower Body Dressing/Undressing Lower body dressing      What is the patient wearing?: Incontinence brief, Pants, Ace wrap/stump shrinker     Lower body assist Assist for lower body dressing: Moderate Assistance - Patient 50 - 74%     Toileting Toileting    Toileting assist Assist for toileting: Maximal Assistance - Patient 25 - 49%     Transfers Chair/bed transfer  Transfers assist     Chair/bed transfer assist level: Maximal Assistance - Patient 25 - 49%     Locomotion Ambulation   Ambulation assist   Ambulation activity did not occur: N/A(bilat amputee)          Walk 10 feet activity   Assist  Walk 10 feet activity did not occur: N/A        Walk 50 feet activity   Assist Walk 50 feet with 2 turns activity did not occur: N/A         Walk 150 feet activity   Assist Walk 150 feet activity did not occur: N/A         Walk 10 feet on uneven surface  activity   Assist Walk 10 feet on uneven surfaces activity did not  occur: N/A         Wheelchair     Assist Will patient use wheelchair at discharge?: Yes Type of Wheelchair: Manual    Wheelchair assist level: Supervision/Verbal cueing Max wheelchair distance: 120    Wheelchair 50 feet with 2 turns activity    Assist        Assist Level: Supervision/Verbal cueing   Wheelchair 150 feet activity     Assist Wheelchair 150 feet activity did not occur: Safety/medical concerns        Medical Problem List and Plan: 1.Decreased functional mobilitysecondary to right BKA 01/19/2019 as well as history of left  AKA 2018 Patient is beginning CIR therapies today including PT and OT  2. Antithrombotics: -DVT/anticoagulation:Subcutaneous heparin -antiplatelet therapy: aspirin 81 mg daily 3. Pain Management:Elavil 25 mg daily at bedtime,Neurontin 300 mg daily at bedtime,oxycodone as needed -reviewed massage and desensitization for R BKA 4. Mood:trazodone 50 mg daily at bedtime, Zoloft 100 mg daily, Ativan 0.5 mg twice a day as needed -antipsychotic agents: N/A 5. Neuropsych: This patientiscapable of making decisions on herown behalf. 6. Skin/Wound Care:Routine skin checks, stump sock -apply stump shrinker when able to tolerate compression  -can use ACE wrap in interim  7. Fluids/Electrolytes/Nutrition:Routine ins and outs with follow-up chemistries 8. CAD. Continue aspirin therapy. No chest pain 9. Diabetes mellitus with peripheral neuropathy. Hemoglobin A1c 8.5. -NovoLog 70/30 70 units at breakfast 60 units at supper -Glucophage 500 mg daily. -fair to bordeline control at present. Observe today 10. Hypertension. Cozaar 100 mg daily. Monitor with increased mobility 11. Tobacco abuse. Provide counseling 12. Pravachol 40 mg daily 13. Constipation. Laxative assistance    LOS: 1 days A FACE TO FACE EVALUATION WAS PERFORMED  Ranelle OysterZachary T  01/24/2019, 10:49 AM

## 2019-01-24 NOTE — Progress Notes (Signed)
Occupational Therapy Session Note  Patient Details  Name: Danielle Buchanan MRN: [id number] Date of Birth: 1956/05/30  Today's Date: 01/24/2019 OT Individual Time: 9326-7124 OT Individual Time Calculation (min): 55 min    Short Term Goals: Week 1:  OT Short Term Goal 1 (Week 1): Pt will completes transfer to DAC/toilet wiht LRAD and min A OT Short Term Goal 2 (Week 1): Pt will complete shirt donning with supervision OT Short Term Goal 3 (Week 1): Pt will complete lateral leans wiht supervision during LB dressing OT Short Term Goal 4 (Week 1): Pt will sit EOM during dynamic sitting balance task for 10 min wiht supervision  Skilled Therapeutic Interventions/Progress Updates:    1:1. Pt received in bed on bed pan. Pt completes rolling with MIN A for hygiene (total A) after BM. Pt completes SBT to R with MOD fading to MIN  A bed>w/c<>EOM. Pt completes w/c propulsion to/from all tx destinations for BUE strengthening. Pt completes 3x10 sit ups iwht 2kg overhead raise for momentum to help with sit up from foam wedge. Pt completes lateral side taps elbow to mat for postural control/core strengthening. Pt completes lateral leans onto elbow reaching for items in mod to max ranges outside BOS with CGA fading to supervision. Pt reporting need to toilet at end of session. Pt completes slide board transfer with MOD A to R to [facility] and A provided for lateral leans to doff clothing. Rn supervises/finishes toileting. Exited session with RN in room and pt seated on DAC.  Therapy Documentation Precautions:  Precautions Precautions: Fall Precaution Comments: now bilateral amputee with change to CoG causing increased posterior lean Restrictions Weight Bearing Restrictions: Yes RLE Weight Bearing: Non weight bearing LLE Weight Bearing: Non weight bearing General:     Therapy/Group: Individual Therapy  Shon Hale 01/24/2019, 4:33 PM

## 2019-01-25 LAB — GLUCOSE, CAPILLARY
Glucose-Capillary: 153 mg/dL — ABNORMAL HIGH (ref 70–99)
Glucose-Capillary: 79 mg/dL (ref 70–99)
Glucose-Capillary: 161 mg/dL — ABNORMAL HIGH (ref 70–99)
Glucose-Capillary: 125 mg/dL — ABNORMAL HIGH (ref 70–99)

## 2019-01-25 MED ORDER — METFORMIN HCL ER 500 MG PO TB24
1000.0000 mg | ORAL_TABLET | Freq: Every day | ORAL | Status: DC
  Administered 2019-01-27 – 2019-02-05 (×8): 1000 mg via ORAL
  Filled 2019-01-25 (×11): qty 2

## 2019-01-25 MED ORDER — METFORMIN HCL ER 500 MG PO TB24
500.0000 mg | ORAL_TABLET | Freq: Once | ORAL | Status: DC
  Filled 2019-01-25: qty 1

## 2019-01-25 NOTE — Progress Notes (Signed)
[facility] PHYSICAL MEDICINE & REHABILITATION PROGRESS NOTE   Subjective/Complaints: Had a good night. Right leg sore. Wearing ACE wrap this morning  ROS: Patient denies fever, rash, sore throat, blurred vision, nausea, vomiting, diarrhea, cough, shortness of breath or chest pain, joint or back pain, headache, or mood change.   Objective:   No results found. Recent Labs    01/23/19 1500  WBC 6.8  HGB 11.0*  HCT 32.9*  PLT 231   Recent Labs    01/23/19 1500  CREATININE 0.88    Intake/Output Summary (Last 24 hours) at 01/25/2019 1043 Last data filed at 01/25/2019 0730 Gross per 24 hour  Intake 720 ml  Output -  Net 720 ml     Physical Exam: Vital Signs Blood pressure (!) 152/69, pulse 66, temperature 97.6 F (36.4 C), resp. rate 16, height 5\' 3"  (1.6 m), weight 92.7 kg, SpO2 95 %. Constitutional: No distress . Vital signs reviewed. HEENT: EOMI, oral membranes moist Neck: supple Cardiovascular: RRR without murmur. No JVD    Respiratory: CTA Bilaterally without wheezes or rales. Normal effort    GI: BS +, non-tender, non-distended  Musculoskeletal:  Comments: Right BK tender to palp. Has ACE on Neurological: She is alertand oriented to person, place, and time. Nocranial nerve deficit.Coordinationnormal. Reasonable insight and awareness  Skin: Skin iswarm. AKA well-healed. Right BKA remains CDI with staples. Psychiatric: pleasant     Assessment/Plan: 1. Functional deficits secondary to right BKA which require 3+ hours per day of interdisciplinary therapy in a comprehensive inpatient rehab setting.  Physiatrist is providing close team supervision and 24 hour management of active medical problems listed below.  Physiatrist and rehab team continue to assess barriers to discharge/monitor patient progress toward functional and medical goals  Care Tool:  Bathing    Body parts bathed by patient: Right arm, Left arm, Chest, Abdomen, Front perineal area,  Buttocks, Right upper leg, Left upper leg, Face     Body parts n/a: Right lower leg, Left lower leg   Bathing assist Assist Level: Minimal Assistance - Patient > 75%     Upper Body Dressing/Undressing Upper body dressing   What is the patient wearing?: Pull over shirt    Upper body assist Assist Level: Minimal Assistance - Patient > 75%    Lower Body Dressing/Undressing Lower body dressing      What is the patient wearing?: Incontinence brief, Pants, Ace wrap/stump shrinker     Lower body assist Assist for lower body dressing: Moderate Assistance - Patient 50 - 74%     Toileting Toileting    Toileting assist Assist for toileting: Maximal Assistance - Patient 25 - 49%     Transfers Chair/bed transfer  Transfers assist     Chair/bed transfer assist level: Maximal Assistance - Patient 25 - 49%     Locomotion Ambulation   Ambulation assist   Ambulation activity did not occur: N/A(bilat amputee)          Walk 10 feet activity   Assist  Walk 10 feet activity did not occur: N/A        Walk 50 feet activity   Assist Walk 50 feet with 2 turns activity did not occur: N/A         Walk 150 feet activity   Assist Walk 150 feet activity did not occur: N/A         Walk 10 feet on uneven surface  activity   Assist Walk 10 feet on uneven surfaces activity did not  occur: N/A         Wheelchair     Assist Will patient use wheelchair at discharge?: Yes Type of Wheelchair: Manual    Wheelchair assist level: Supervision/Verbal cueing Max wheelchair distance: 120    Wheelchair 50 feet with 2 turns activity    Assist        Assist Level: Supervision/Verbal cueing   Wheelchair 150 feet activity     Assist Wheelchair 150 feet activity did not occur: Safety/medical concerns        Medical Problem List and Plan: 1.Decreased functional mobilitysecondary to right BKA 01/19/2019 as well as history of left AKA  2018 -Continue CIR therapies including PT, OT   2. Antithrombotics: -DVT/anticoagulation:Subcutaneous heparin -antiplatelet therapy: aspirin 81 mg daily 3. Pain Management:Elavil 25 mg daily at bedtime,Neurontin 300 mg daily at bedtime,oxycodone as needed -reviewed massage and desensitization for R BKA 4. Mood:trazodone 50 mg daily at bedtime, Zoloft 100 mg daily, Ativan 0.5 mg twice a day as needed -antipsychotic agents: N/A 5. Neuropsych: This patientiscapable of making decisions on herown behalf. 6. Skin/Wound Care:Routine skin checks  -ACE wrap, dry dressing for now 7. Fluids/Electrolytes/Nutrition:Routine ins and outs with follow-up chemistries 8. CAD. Continue aspirin therapy. No chest pain 9. Diabetes mellitus with peripheral neuropathy. Hemoglobin A1c 8.5.  -borderline control -NovoLog 70/30 70 units at breakfast 60 units at supper -Glucophage 500 mg daily---increase to bid  10. Hypertension. Cozaar 100 mg daily. Monitor with increased mobility 11. Tobacco abuse. Provide counseling 12. Pravachol 40 mg daily 13. Constipation. Laxative assistance    LOS: 2 days A FACE TO FACE EVALUATION WAS PERFORMED  Ranelle OysterZachary T Swartz 01/25/2019, 10:43 AM

## 2019-01-26 ENCOUNTER — Inpatient Hospital Stay (HOSPITAL_COMMUNITY): Payer: Medicare HMO | Admitting: Physical Therapy

## 2019-01-26 ENCOUNTER — Inpatient Hospital Stay (HOSPITAL_COMMUNITY): Payer: Medicare HMO

## 2019-01-26 ENCOUNTER — Inpatient Hospital Stay (HOSPITAL_COMMUNITY): Payer: Medicare HMO | Admitting: Occupational Therapy

## 2019-01-26 ENCOUNTER — Ambulatory Visit: Payer: Medicare HMO | Admitting: Family

## 2019-01-26 ENCOUNTER — Ambulatory Visit: Payer: Medicare HMO | Admitting: Surgery

## 2019-01-26 LAB — CBC WITH DIFFERENTIAL/PLATELET
Abs Immature Granulocytes: 0.3 10*3/uL — ABNORMAL HIGH (ref 0.00–0.07)
Basophils Absolute: 0.3 10*3/uL — ABNORMAL HIGH (ref 0.0–0.1)
Basophils Relative: 3 %
Eosinophils Absolute: 0.5 10*3/uL (ref 0.0–0.5)
Eosinophils Relative: 6 %
HCT: 37.3 % (ref 36.0–46.0)
Hemoglobin: 12.1 g/dL (ref 12.0–15.0)
Lymphocytes Relative: 10 %
Lymphs Abs: 0.8 10*3/uL (ref 0.7–4.0)
MCH: 27.4 pg (ref 26.0–34.0)
MCHC: 32.4 g/dL (ref 30.0–36.0)
MCV: 84.6 fL (ref 80.0–100.0)
Monocytes Absolute: 0.3 10*3/uL (ref 0.1–1.0)
Monocytes Relative: 4 %
Myelocytes: 3 %
Neutro Abs: 6.2 10*3/uL (ref 1.7–7.7)
Neutrophils Relative %: 74 %
Platelets: 250 10*3/uL (ref 150–400)
RBC: 4.41 MIL/uL (ref 3.87–5.11)
RDW: 13.5 % (ref 11.5–15.5)
WBC: 8.4 10*3/uL (ref 4.0–10.5)
nRBC: 0 % (ref 0.0–0.2)
nRBC: 0 /100 WBC

## 2019-01-26 LAB — COMPREHENSIVE METABOLIC PANEL
ALT: 15 U/L (ref 0–44)
AST: 18 U/L (ref 15–41)
Albumin: 3.2 g/dL — ABNORMAL LOW (ref 3.5–5.0)
Alkaline Phosphatase: 68 U/L (ref 38–126)
Anion gap: 19 — ABNORMAL HIGH (ref 5–15)
BUN: 15 mg/dL (ref 8–23)
CO2: 18 mmol/L — ABNORMAL LOW (ref 22–32)
Calcium: 9.3 mg/dL (ref 8.9–10.3)
Chloride: 101 mmol/L (ref 98–111)
Creatinine, Ser: 0.73 mg/dL (ref 0.44–1.00)
GFR calc Af Amer: >60 mL/min (ref >60)
GFR calc non Af Amer: >60 mL/min (ref >60)
Glucose, Bld: 110 mg/dL — ABNORMAL HIGH (ref 70–99)
Potassium: 3.8 mmol/L (ref 3.5–5.1)
Sodium: 138 mmol/L (ref 135–145)
Total Bilirubin: 0.7 mg/dL (ref 0.3–1.2)
Total Protein: 6.5 g/dL (ref 6.5–8.1)

## 2019-01-26 LAB — GLUCOSE, CAPILLARY
Glucose-Capillary: 120 mg/dL — ABNORMAL HIGH (ref 70–99)
Glucose-Capillary: 80 mg/dL (ref 70–99)
Glucose-Capillary: 150 mg/dL — ABNORMAL HIGH (ref 70–99)
Glucose-Capillary: 92 mg/dL (ref 70–99)

## 2019-01-26 NOTE — Progress Notes (Signed)
Social Work  Social Work Assessment and Plan  Patient Details  Name: Danielle Buchanan MRN: [id number] Date of Birth: [birth year]  Today's Date: 01/26/2019  Problem List:  Patient Active Problem List   Diagnosis Date Noted  . Right below-knee amputee (HCC) 01/23/2019  . Gangrene of right foot (HCC) 01/17/2019  . History of transmetatarsal amputation of right foot (HCC) 12/13/2018  . At risk for adverse drug event 11/27/2018  . Osteomyelitis of third toe of right foot (HCC) 11/12/2018  . Pain in finger of right hand 02/20/2018  . Atherosclerosis of native arteries of the extremities with gangrene (HCC) 11/01/2017  . Unilateral AKA, left (HCC)   . Post-operative pain   . OSA (obstructive sleep apnea)   . Benign essential HTN   . DM (diabetes mellitus), secondary, uncontrolled, with peripheral vascular complications (HCC)   . Hyperkalemia   . Leukocytosis   . Acute blood loss anemia   . Amputation stump infection (HCC) 08/23/2017  . Amputation of left lower extremity above knee upon examination (HCC) 08/23/2017  . PAD (peripheral artery disease) (HCC) 07/23/2017  . Cellulitis 06/10/2017  . Hyponatremia 06/10/2017  . Fever 06/10/2017  . Rash 05/24/2017  . HTN (hypertension)   . Allergic rhinitis   . Insomnia   . Depression    Past Medical History:  Past Medical History:  Diagnosis Date  . Allergic rhinitis   . Depression   . Diabetes (HCC)   . Dyslipidemia   . HTN (hypertension)   . Insomnia   . OSA (obstructive sleep apnea)    no cpap  . Urinary incontinence    Past Surgical History:  Past Surgical History:  Procedure Laterality Date  . ABDOMINAL AORTOGRAM W/LOWER EXTREMITY N/A 06/24/2017   Procedure: ABDOMINAL AORTOGRAM W/LOWER EXTREMITY;  Surgeon: Maeola Harman, MD;  Location: [facility] INVASIVE CV LAB;  Service: Cardiovascular;  Laterality: N/A;  . ABDOMINAL AORTOGRAM W/LOWER EXTREMITY Right 10/09/2017   Procedure: ABDOMINAL AORTOGRAM W/LOWER EXTREMITY;   Surgeon: Maeola Harman, MD;  Location: [facility] INVASIVE CV LAB;  Service: Cardiovascular;  Laterality: Right;  . AMPUTATION Left 07/23/2017   Procedure: AMPUTATION  BELOW KNEE;  Surgeon: Maeola Harman, MD;  Location: [facility] OR;  Service: Vascular;  Laterality: Left;  . AMPUTATION Left 08/12/2017   Procedure: REVISION BELOW KNEE;  Surgeon: Maeola Harman, MD;  Location: [facility] OR;  Service: Vascular;  Laterality: Left;  . AMPUTATION Left 08/27/2017   Procedure: left ABOVE KNEE AMPUTATION;  Surgeon: Maeola Harman, MD;  Location: [facility] OR;  Service: Vascular;  Laterality: Left;  . AMPUTATION Left 08/30/2017   Procedure: REVISION AMPUTATION ABOVE KNEE;  Surgeon: Nada Libman, MD;  Location: [facility] OR;  Service: Vascular;  Laterality: Left;  . AMPUTATION Right 11/04/2017   Procedure: AMPUTATION RIGHT FOURTH TOE;  Surgeon: Maeola Harman, MD;  Location: [facility] OR;  Service: Vascular;  Laterality: Right;  . AMPUTATION Right 11/12/2018   Procedure: AMPUTATION OF second, third and fifth toes;  Surgeon: Nada Libman, MD;  Location: [facility] OR;  Service: Vascular;  Laterality: Right;  . AMPUTATION Right 01/19/2019   Procedure: AMPUTATION BELOW KNEE;  Surgeon: Sherren Kerns, MD;  Location: [facility] OR;  Service: Vascular;  Laterality: Right;  . APPLICATION OF WOUND VAC Left 08/27/2017   Procedure: APPLICATION OF WOUND VAC;  Surgeon: Maeola Harman, MD;  Location: [facility] OR;  Service: Vascular;  Laterality: Left;  . CARPAL TUNNEL RELEASE Right   . COLONOSCOPY    . LOWER  EXTREMITY ANGIOGRAM Right 11/12/2018   Procedure: ANGIOGRAM OF Right LEG;  Surgeon: Nada LibmanBrabham, Vance W, MD;  [facility];  Service: Vascular;  Laterality: Right;  . LOWER EXTREMITY INTERVENTION Right 10/09/2017   Procedure: LOWER EXTREMITY INTERVENTION;  Surgeon: Maeola Harmanain, Brandon Christopher, MD;  [facility];  Service: Cardiovascular;  Laterality: Right;  . TOE AMPUTATION Right 11/12/2018    2ND 3RD & 5TH TOE   . TRANSMETATARSAL AMPUTATION Right 11/19/2018   Procedure: TRANSMETATARSAL AMPUTATION REVISION;  Surgeon: Nada LibmanBrabham, Vance W, MD;  [facility];  Service: Vascular;  Laterality: Right;   Social History:  reports that she quit smoking about 5 months ago. Her smoking use included cigarettes. She has a 51.00 pack-year smoking history. She has never used smokeless tobacco. She reports that she does not drink alcohol or use drugs.  Family / Support Systems Marital Status: Single Patient Roles: Other (Comment)(has a neice and nephew) Other Supports: nephew-in-law, Marikay AlarGreg Richardson @ [phone] and Judene Companionneice, Lisa Richardson @ [phone] Anticipated Caregiver: pt's nephew, Tammy SoursGreg Ability/Limitations of Caregiver: states he will arrange 24/7 if needed at discharge Caregiver Availability: 24/7 Family Dynamics: Pt describes very close relationship with neice and her husband as they have been living close to one another for several years [place]  Pt states they provide very reliable support to her.  Social History Preferred language: English Religion: Non-Denominational Cultural Background: NA Read: Yes Write: Yes Employment Status: Retired Marine scientistLegal History/Current Legal Issues: None Guardian/Conservator: None - per MD, pt is capable of making decisions on her own behalf.   Abuse/Neglect Abuse/Neglect Assessment Can Be Completed: Yes Physical Abuse: Denies Verbal Abuse: Denies Sexual Abuse: Denies Exploitation of patient/patient's resources: Denies Self-Neglect: Denies  Emotional Status Pt's affect, behavior and adjustment status: Pt pleasant, sitting up in w/ and completes assessment interview without any difficulty.  She is very happy to be receiving her rehab on CIR as two prior SNF stays "were awful".  She is hopeful she will reach the mod ind goals.  She states, "I've got to figure out different ways to do things."  Pt denies any significant  emotional distress - will monitor and refer for neuropsychology as indicated. Recent Psychosocial Issues: Prior amputations, each with SNF stays that were "awful" per pt.  She also notes that her sister and her dog died within the past year. Psychiatric History: None Substance Abuse History: None  Patient / Family Perceptions, Expectations & Goals Pt/Family understanding of illness & functional limitations: Pt and family with good, general understanding of medical issues leading to need for amputation.  Good understanding of current functional limitations/ need for CIR. Premorbid pt/family roles/activities: Pt was mostly independent in her handicap accessible apartment.  Neice would assist with bathing and cleaning and nephew with transportation needs. Anticipated changes in roles/activities/participation: Little change to existing roles. Pt/family expectations/goals: "I need to figure out how to get around with no legs now."  [employer] Resources Community Agencies: None Premorbid Home Care/DME Agencies: Other (Comment)([facility]) Transportation available at discharge: yes Resource referrals recommended: Support group (specify)  Discharge Planning Living Arrangements: Alone Support Systems: Other relatives, Friends/neighbors Type of Residence: Private residence Insurance Resources: Medicare([employer] Medicare) Financial Resources: Restaurant manager, fast foodocial Security Financial Screen Referred: No Living Expenses: Rent Money Management: Patient Does the patient have any problems obtaining your medications?: No Home Management: Pt with occasional assist of neice Patient/Family Preliminary Plans: Pt to return to her apartment in intermittent assist of neice and her spouse. Social Work Anticipated Follow Up  Needs: HH/OP Expected length of stay: 10-14 days  Clinical Impression Very pleasant woman on CIR following new BKA and h/o prior AKA.  Living alone in handicap accessible apartment with local niece and her husband  providing frequent, reliable support.  She is motivated for CIR and pleased she is here instead of at SNF.  Denies any significant emotional distress at this time.  Will follow for d/c planning needs.  Parish Augustine 01/26/2019, 1:29 PM

## 2019-01-26 NOTE — Progress Notes (Signed)
Occupational Therapy Session Note  Patient Details  Name: Danielle Buchanan MRN: [id number] Date of Birth: 04-24-1956  Today's Date: 01/26/2019 OT Individual Time: 1235-1340 OT Individual Time Calculation (min): 65 min  and Today's Date: 01/26/2019 OT Missed Time: 10 Minutes Missed Time Reason: Patient fatigue   Short Term Goals: Week 1:  OT Short Term Goal 1 (Week 1): Pt will completes transfer to DAC/toilet wiht LRAD and min A OT Short Term Goal 2 (Week 1): Pt will complete shirt donning with supervision OT Short Term Goal 3 (Week 1): Pt will complete lateral leans wiht supervision during LB dressing OT Short Term Goal 4 (Week 1): Pt will sit EOM during dynamic sitting balance task for 10 min wiht supervision  Skilled Therapeutic Interventions/Progress Updates:    Pt seen for OT session focusing on ADL re-training. Ptsitting up in w/c upon arrival eating lunch and agreeable to tx session. She reports pain 8/10 at urgical site. RN made aware and medication administered at start of session. Discusion of pt's PLOF, home layout and d/c planning. Pt reports she has new prosthetic that needs to be picked up from orthotistis. Pt called son during session to see if he could pick it up in order to practice in therapy as PT had great difficulty donning prosthetic. She completed mod A sliding board transfer from w/c>EOB with max multi-modal cuing for head/hip relationship and maintaining throughout duration of transfer. She completed UB bathing/dressing seated on EOB with set-up and supervision, several LOB episodes which pt was able to self correct. Following VCs, she transitioned back to supine and rolled to doff pants using hospital bed features and VCs. LB bathing and pericare as well as donning of pants completed from supine position with supervision and VCs for technique. Therapist assisted in donning L prosthetic "skin with max A, pt able to direct caregiver in technique of donning prosthetic. Pt left  in supine to rest in prep for next tx session. All needs in reach and bed alarm on.   Therapy Documentation Precautions:  Precautions Precautions: Fall Precaution Comments: now bilateral amputee with change to CoG causing increased posterior lean Restrictions Weight Bearing Restrictions: Yes RLE Weight Bearing: Non weight bearing LLE Weight Bearing: Non weight bearing   Therapy/Group: Individual Therapy  Aasia Peavler L 01/26/2019, 7:12 AM

## 2019-01-26 NOTE — Progress Notes (Signed)
Physical Therapy Session Note  Patient Details  Name: Danielle Buchanan MRN: [id number] Date of Birth: 1956-03-24  Today's Date: 01/26/2019 PT Individual Time: 0800-0830 PT Individual Time Calculation (min): 30 min   Short Term Goals: Week 1:  PT Short Term Goal 1 (Week 1): pt will perform functional transfers with min A PT Short Term Goal 2 (Week 1): pt will demo dynamic sitting balance for therapeutic activities with supervision  Skilled Therapeutic Interventions/Progress Updates: Pt presented in bed agreeable to therapy. PTA threaded pants through residual limbs and pt performed lateral leans with minA for pulling pants over hips. Pt then performed SB transfer to w/c requiring modA. Pt required mod multimodal cues for correct hand placement as pt was attempting to reach furthest arm rest anteriorly placing pt in unsafe position. MD arrived for am assessment and changed dressing on residual limb. Pt transported to ortho gym and participated in UBE Level 5 x 4 minutes for endurance. Pt transported back to room and left with call bell within reach and needs met.       Therapy Documentation Precautions:  Precautions Precautions: Fall Precaution Comments: now bilateral amputee with change to CoG causing increased posterior lean Restrictions Weight Bearing Restrictions: Yes RLE Weight Bearing: Non weight bearing LLE Weight Bearing: Non weight bearing General:   Vital Signs:   Pain: Pain Assessment Pain Scale: 0-10 Pain Score: 8  Pain Type: Acute pain;Surgical pain;Phantom pain Pain Location: Leg Pain Orientation: Right Pain Descriptors / Indicators: Aching;Throbbing Pain Frequency: Intermittent Pain Onset: On-going Patients Stated Pain Goal: 2 Pain Intervention(s): Medication (See eMAR)   Therapy/Group: Individual Therapy  Danielle Buchanan  Sundus Pete, PTA  01/26/2019, 1:47 PM

## 2019-01-26 NOTE — Progress Notes (Signed)
Physical Therapy Session Note  Patient Details  Name: Danielle Buchanan MRN: [id number] Date of Birth: 1956-06-25  Today's Date: 01/26/2019 PT Individual Time: 0915-1030 PT Individual Time Calculation (min): 75 min   Short Term Goals: Week 1:  PT Short Term Goal 1 (Week 1): pt will perform functional transfers with min A PT Short Term Goal 2 (Week 1): pt will demo dynamic sitting balance for therapeutic activities with supervision  Skilled Therapeutic Interventions/Progress Updates:    Session 1: Pt received seated in w/c in room, agreeable to PT session. Manual w/c propulsion x 150 ft with use of BUE and Supervision, increased time needed to complete task. Slide board transfer w/c to/from mat table with min A progressing to mod A with onset of fatigue. Pt requires max multimodal cueing for head/hips relationship during transfer. Session focus on assisting pt to don LLE prosthetic. Pt will need to be able to don her prosthetic in order to perform a stand pivot transfer into the car she will be d/c home in. Pt is min A to don socket in supine on mat. Attempt to have pt stand to RW from elevated mat table to don prosthetic limb, unable to achieve standing from this position. Attempt sit to stand in // bars, pt unable to achieve standing due to low height of w/c seat. Attempt sit to stand from elevated mat table to // bars, pt again unable to achieve standing due to weakness. Pt is finally able to fully don RLE prosthetic in supine on mat with prosthetic limb flexed at knee and with manual assist from therapist. Supine to/from sit with CGA. Pt fatigued following donning of limb and unable to attempt further standing this session. Seated push-ups from push-up blocks x 10 reps, pt unable to clear buttocks. Slide board transfer back to w/c with mod A. Pt left seated in w/c in room with needs in reach at end of session.   Session 2: Pt received supine in bed asleep, arousable and agreeable to participate in  therapy session. Supine to sit with min A for LLE management as pt has prosthetic on. Attempt sit to stand to RW in order to don prosthetic. Unable to come to a full stand to get prosthetic all the way on. Per pt report she was not actually able to don her prosthetic this AM. Core strengthening/balance: ball toss while seated EOB reaching outside BOS to catch ball; 2.2# weighted ball punch-outs. Provided pt with home measurement sheet to have family measure heights of various surfaces in the home. Pt left seated in bed with needs in reach, bed alarm in place.  Therapy Documentation Precautions:  Precautions Precautions: Fall Precaution Comments: now bilateral amputee with change to CoG causing increased posterior lean Restrictions Weight Bearing Restrictions: Yes RLE Weight Bearing: Non weight bearing LLE Weight Bearing: Non weight bearing    Therapy/Group: Individual Therapy   Peter Congo, PT, DPT  01/26/2019, 12:35 PM

## 2019-01-26 NOTE — Progress Notes (Signed)
Inpatient Rehabilitation  Patient information reviewed and entered into eRehab system by Channing Savich M. Marci Polito, M.A., CCC/SLP, PPS Coordinator.  Information including medical coding, functional ability and quality indicators will be reviewed and updated through discharge.    

## 2019-01-26 NOTE — Progress Notes (Signed)
[facility] PHYSICAL MEDICINE & REHABILITATION PROGRESS NOTE   Subjective/Complaints: Doing fairly well. Pain under control although stump still sensitive  ROS: Patient denies fever, rash, sore throat, blurred vision, nausea, vomiting, diarrhea, cough, shortness of breath or chest pain,   headache, or mood change. .   Objective:   No results found. Recent Labs    01/23/19 1500 01/26/19 0653  WBC 6.8 8.4  HGB 11.0* 12.1  HCT 32.9* 37.3  PLT 231 250   Recent Labs    01/23/19 1500 01/26/19 0653  NA  --  138  K  --  3.8  CL  --  101  CO2  --  18*  GLUCOSE  --  110*  BUN  --  15  CREATININE 0.88 0.73  CALCIUM  --  9.3    Intake/Output Summary (Last 24 hours) at 01/26/2019 1001 Last data filed at 01/25/2019 1730 Gross per 24 hour  Intake 480 ml  Output -  Net 480 ml     Physical Exam: Vital Signs Blood pressure 129/74, pulse 65, temperature 98 F (36.7 C), temperature source Oral, resp. rate 16, height 5\' 3"  (1.6 m), weight 92.7 kg, SpO2 97 %. Constitutional: No distress . Vital signs reviewed. HEENT: EOMI, oral membranes moist Neck: supple Cardiovascular: RRR without murmur. No JVD    Respiratory: CTA Bilaterally without wheezes or rales. Normal effort    GI: BS +, non-tender, non-distended  Musculoskeletal:  Comments: Right BK tender to palp along edges Neurological: She is alertand oriented to person, place, and time. Nocranial nerve deficit.Coordinationnormal. Reasonable insight and awareness  Skin: Skin iswarm. AKA well-healed. Right BKA intact with staples. Minimal s/s discharge.  Psychiatric: pleasant    Assessment/Plan: 1. Functional deficits secondary to right BKA which require 3+ hours per day of interdisciplinary therapy in a comprehensive inpatient rehab setting.  Physiatrist is providing close team supervision and 24 hour management of active medical problems listed below.  Physiatrist and rehab team continue to assess barriers to  discharge/monitor patient progress toward functional and medical goals  Care Tool:  Bathing    Body parts bathed by patient: Right arm, Left arm, Chest, Abdomen, Front perineal area, Buttocks, Right upper leg, Left upper leg, Face     Body parts n/a: Right lower leg, Left lower leg   Bathing assist Assist Level: Minimal Assistance - Patient > 75%     Upper Body Dressing/Undressing Upper body dressing   What is the patient wearing?: Pull over shirt    Upper body assist Assist Level: Minimal Assistance - Patient > 75%    Lower Body Dressing/Undressing Lower body dressing      What is the patient wearing?: Incontinence brief, Pants, Ace wrap/stump shrinker     Lower body assist Assist for lower body dressing: Moderate Assistance - Patient 50 - 74%     Toileting Toileting    Toileting assist Assist for toileting: Maximal Assistance - Patient 25 - 49%     Transfers Chair/bed transfer  Transfers assist     Chair/bed transfer assist level: Maximal Assistance - Patient 25 - 49%     Locomotion Ambulation   Ambulation assist   Ambulation activity did not occur: N/A(bilat amputee)          Walk 10 feet activity   Assist  Walk 10 feet activity did not occur: N/A        Walk 50 feet activity   Assist Walk 50 feet with 2 turns activity did not occur: N/A  Walk 150 feet activity   Assist Walk 150 feet activity did not occur: N/A         Walk 10 feet on uneven surface  activity   Assist Walk 10 feet on uneven surfaces activity did not occur: N/A         Wheelchair     Assist Will patient use wheelchair at discharge?: Yes Type of Wheelchair: Manual    Wheelchair assist level: Supervision/Verbal cueing Max wheelchair distance: 120    Wheelchair 50 feet with 2 turns activity    Assist        Assist Level: Supervision/Verbal cueing   Wheelchair 150 feet activity     Assist Wheelchair 150 feet activity did  not occur: Safety/medical concerns        Medical Problem List and Plan: 1.Decreased functional mobilitysecondary to right BKA 01/19/2019 as well as history of left AKA 2018 -Continue CIR therapies including PT, OT   2. Antithrombotics: -DVT/anticoagulation:Subcutaneous heparin -antiplatelet therapy: aspirin 81 mg daily 3. Pain Management:Elavil 25 mg daily at bedtime,Neurontin 300 mg daily at bedtime,oxycodone as needed -continue massage and desensitization for R BKA 4. Mood:trazodone 50 mg daily at bedtime, Zoloft 100 mg daily, Ativan 0.5 mg twice a day as needed -antipsychotic agents: N/A 5. Neuropsych: This patientiscapable of making decisions on herown behalf. 6. Skin/Wound Care:Routine skin checks  -ACE wrap 4", telfa---change daily 7. Fluids/Electrolytes/Nutrition:Routine ins and outs with follow-up chemistries 8. CAD. Continue aspirin therapy. No chest pain 9. Diabetes mellitus with peripheral neuropathy. Hemoglobin A1c 8.5.  -borderline but improving control -NovoLog 70/30 70 units at breakfast 60 units at supper -Glucophage 500 mg daily---increased to bid  10. Hypertension. Cozaar 100 mg daily. Monitor with increased mobility 11. Tobacco abuse. Provide counseling 12. Pravachol 40 mg daily 13. Constipation. Laxative assistance    LOS: 3 days A FACE TO FACE EVALUATION WAS PERFORMED  Ranelle OysterZachary T Swartz 01/26/2019, 10:01 AM

## 2019-01-26 NOTE — IPOC Note (Signed)
Overall Plan of Care [facility](IPOC) Patient Details Name: Danielle ContesJanice M Buchanan MRN: [id number] DOB: September 06, 1956  Admitting Diagnosis: <principal problem not specified>  Hospital Problems: Active Problems:   Right below-knee amputee [facility](HCC)     Functional Problem List: Nursing Bladder, Endurance, Medication Management, Sensory, Pain, Motor, Skin Integrity  PT Balance, Edema, Endurance, Pain, Motor, Sensory, Skin Integrity  OT Balance, Cognition, Endurance, Motor, Pain, Safety, Skin Integrity  SLP    TR         Basic ADL's: OT Grooming, Bathing, Dressing, Toileting     Advanced  ADL's: OT Simple Meal Preparation     Transfers: PT Bed Mobility, Bed to Chair, Car, Occupational psychologisturniture  OT Toilet, Research scientist (life sciences)Tub/Shower     Locomotion: PT Wheelchair Mobility     Additional Impairments: OT    SLP        TR      Anticipated Outcomes Item Anticipated Outcome  Self Feeding MOD I  Swallowing      Basic self-care  MOD I  Toileting  MOD I toilet; S shower   Bathroom Transfers MOD I toilet; S shower  Bowel/Bladder  Mod I assist  Transfers     Locomotion     Communication     Cognition     Pain  < 4  Safety/Judgment  Mod I assist   Therapy Plan: PT Intensity: Minimum of 1-2 x/day ,45 to 90 minutes PT Frequency: 5 out of 7 days PT Duration Estimated Length of Stay: 10-12 days OT Intensity: Minimum of 1-2 x/day, 45 to 90 minutes OT Frequency: 5 out of 7 days OT Duration/Estimated Length of Stay: 10-14     Due to the current state of emergency, patients may not be receiving their 3-hours of Medicare-mandated therapy.   Team Interventions: Nursing Interventions Patient/Family Education, Medication Management, Disease Management/Prevention, Pain Management, Skin Care/Wound Management, Discharge Planning, Psychosocial Support  PT interventions Community reintegration, DME/adaptive equipment instruction, Neuromuscular re-education, UE/LE Strength taining/ROM, Wheelchair propulsion/positioning,  Warden/rangerBalance/vestibular training, Discharge planning, Pain management, Skin care/wound management, Therapeutic Activities, UE/LE Coordination activities, Therapeutic Exercise, Splinting/orthotics, Patient/family education, Functional mobility training  OT Interventions Balance/vestibular training, Disease mangement/prevention, Neuromuscular re-education, Self Care/advanced ADL retraining, Therapeutic Exercise, Wheelchair propulsion/positioning, Cognitive remediation/compensation, Pain management, DME/adaptive equipment instruction, Skin care/wound managment, UE/LE Strength taining/ROM, Community reintegration, Equities traderatient/family education, Splinting/orthotics, UE/LE Coordination activities, Discharge planning, Functional mobility training, Psychosocial support, Therapeutic Activities, Visual/perceptual remediation/compensation  SLP Interventions    TR Interventions    SW/CM Interventions Discharge Planning, Psychosocial Support, Patient/Family Education   Barriers to Discharge MD  Medical stability  Nursing Decreased caregiver support    PT Decreased caregiver support    OT Decreased caregiver support, Lack of/limited family support family only avaiable PRN  SLP      SW       Team Discharge Planning: Destination: PT-  ,OT- Home , SLP-  Projected Follow-up: PT- , OT-  Home health OT, SLP-  Projected Equipment Needs: PT- , OT- Other (comment), SLP-  Equipment Details: PT- , OT-DAC Patient/family involved in discharge planning: PT-  ,  OT-Patient, SLP-   MD ELOS: 10-12 days Medical Rehab Prognosis:  Excellent Assessment: The patient has been admitted for CIR therapies with the diagnosis of right BKA, hx of left AKA. The team will be addressing functional mobility, strength, stamina, balance, safety, adaptive techniques and equipment, self-care, bowel and bladder mgt, patient and caregiver education, NMR, pre-prosthetic education, pain control, wound care, community reentry. Goals have been set at mod  I for self-care and mobility  at a w/c level.   Due to the current state of emergency, patients may not be receiving their 3 hours per day of Medicare-mandated therapy.    Ranelle Oyster, MD, FAAPMR      See Team Conference Notes for weekly updates to the plan of care

## 2019-01-26 NOTE — Progress Notes (Signed)
Occupational Therapy Session Note  Patient Details  Name: Danielle Buchanan MRN: [id number] Date of Birth: [birth year]  Today's Date: 01/26/2019 OT Individual Time: 1100-1130 OT Individual Time Calculation (min): 30 min    Short Term Goals: Week 1:  OT Short Term Goal 1 (Week 1): Pt will completes transfer to DAC/toilet wiht LRAD and min A OT Short Term Goal 2 (Week 1): Pt will complete shirt donning with supervision OT Short Term Goal 3 (Week 1): Pt will complete lateral leans wiht supervision during LB dressing OT Short Term Goal 4 (Week 1): Pt will sit EOM during dynamic sitting balance task for 10 min wiht supervision  Skilled Therapeutic Interventions/Progress Updates:    Session focused on B UE strengthening/endurance. Pt received in w/c with c/o pain 4/10 in residual limb, no request for intervention. Pt completed 150 ft of w/c propulsion with no physical assist but increased rest breaks/time. Pt completed 5 min of BUE ergometer as a preparatory method for strengthening exercises. Pt transferred to therapy mat with mod A using slideboard, moderate cueing for technique. Pt then held 4 # dowel and complete BUE strengthening circuit to increase strength needed for ADL transfers. Pt returned to room and was left sitting up with all needs met.  Therapy Documentation Precautions:  Precautions Precautions: Fall Precaution Comments: now bilateral amputee with change to CoG causing increased posterior lean Restrictions Weight Bearing Restrictions: Yes RLE Weight Bearing: Non weight bearing LLE Weight Bearing: Non weight bearing   Therapy/Group: Individual Therapy  Curtis Sites 01/26/2019, 7:24 AM

## 2019-01-27 ENCOUNTER — Inpatient Hospital Stay (HOSPITAL_COMMUNITY): Payer: Medicare HMO

## 2019-01-27 ENCOUNTER — Inpatient Hospital Stay (HOSPITAL_COMMUNITY): Payer: Medicare HMO | Admitting: Occupational Therapy

## 2019-01-27 ENCOUNTER — Inpatient Hospital Stay (HOSPITAL_COMMUNITY): Payer: Medicare HMO | Admitting: Physical Therapy

## 2019-01-27 LAB — GLUCOSE, CAPILLARY
Glucose-Capillary: 121 mg/dL — ABNORMAL HIGH (ref 70–99)
Glucose-Capillary: 147 mg/dL — ABNORMAL HIGH (ref 70–99)
Glucose-Capillary: 179 mg/dL — ABNORMAL HIGH (ref 70–99)
Glucose-Capillary: 132 mg/dL — ABNORMAL HIGH (ref 70–99)

## 2019-01-27 NOTE — Progress Notes (Addendum)
Physical Therapy Session Note  Patient Details  Name: Danielle Buchanan MRN: [id number] Date of Birth: April 08, 1956  Today's Date: 01/27/2019 PT Individual Time: 0830-0900 PT Individual Time Calculation (min): 30 min   Short Term Goals: Week 1:  PT Short Term Goal 1 (Week 1): pt will perform functional transfers with min A PT Short Term Goal 2 (Week 1): pt will demo dynamic sitting balance for therapeutic activities with supervision  Skilled Therapeutic Interventions/Progress Updates:    Patient asleep in bed aroused with verbal and tactile stim.  Reports poor sleep overnight due to severe pain ("15").  Agreeable to bed level exercise.  Performed UE/LE therex as noted below.  Rolling to sidelying and prone to L with min A.  Patient left in supine with call bell in reach and bed alarm activated.   Therapy Documentation Precautions:  Precautions Precautions: Fall Precaution Comments: now bilateral amputee with change to CoG causing increased posterior lean Restrictions Weight Bearing Restrictions: Yes RLE Weight Bearing: Non weight bearing LLE Weight Bearing: Non weight bearing Pain: Pain Assessment Pain Scale: 0-10 Pain Score: 5  Pain Type: Surgical pain Pain Location: Leg Pain Orientation: Right Pain Descriptors / Indicators: Aching Pain Frequency: Intermittent Pain Onset: On-going Patients Stated Pain Goal: 2 Pain Intervention(s): Rest;Repositioned Multiple Pain Sites: No   Exercises: General Exercises - Upper Extremity Shoulder Horizontal ABduction: Strengthening;Both;10 reps;Theraband;Supine Theraband Level (Shoulder Horizontal Abduction): Level 3 (Green) Elbow Extension: Strengthening;Both;10 reps;Theraband;Supine Theraband Level (Elbow Extension): Level 3 (Green) Amputee Exercises Quad Sets: Strengthening;Both;10 reps;Supine Gluteal Sets: Strengthening;Both;10 reps;Supine Towel Squeeze: Strengthening;Both;10 reps;Supine Hip Extension: Strengthening;Right;10  reps;Prone Hip ABduction/ADduction: Strengthening;Both;10 reps;Sidelying Knee Flexion: Strengthening;Right;10 reps;Other (comment)(prone) Straight Leg Raises: Strengthening;Both;10 reps;Supine    Therapy/Group: Individual Therapy  Elray Mcgregor  Sheran Lawless, PT 01/27/2019, 8:55 AM

## 2019-01-27 NOTE — Progress Notes (Signed)
Physical Therapy Note   Downgraded transfer goals to Supervision due to lack of progress.  Peter Congo, PT, DPT  01/27/2019, 3:52 PM

## 2019-01-27 NOTE — Progress Notes (Signed)
Occupational Therapy Session Note  Patient Details  Name: Danielle Buchanan MRN: [id number] Date of Birth: 11/10/1955  Today's Date: 01/27/2019 OT Individual Time: 4431-5400 OT Individual Time Calculation (min): 55 min    Short Term Goals: Week 1:  OT Short Term Goal 1 (Week 1): Pt will completes transfer to DAC/toilet wiht LRAD and min A OT Short Term Goal 2 (Week 1): Pt will complete shirt donning with supervision OT Short Term Goal 3 (Week 1): Pt will complete lateral leans wiht supervision during LB dressing OT Short Term Goal 4 (Week 1): Pt will sit EOM during dynamic sitting balance task for 10 min wiht supervision  Skilled Therapeutic Interventions/Progress Updates:    Pt seen for OT session focusing on functional transfers and sitting balance, pt decliining bathing/dressing this session. Pt in supine upon arrival, voiced pain as "manageable" and agreeable to tx session without intervention. She transferred to sitting EOB with supervision using hospital bed functions. Sliding board transfer EOB>w/c with CGA overall with assist to place board and max cuing for head/hip relationship with significantly increased time. She gathered personal items throughout room at w/c level with supervision and VCs for locking w/c brakes at appropriate times.  She self propelled w/c throughout unit with supervision for UE strengthening and endurance.  In therapy gym, completed sliding board transfer with min A and same cuing/set-up as noted above. Completed dynamic activities seated EOM to address sitting balance deficits and assisting pt in finding new BOS. Completed lateral leans x10 to R/L with supervision and VCs for technique with education provided regarding functional uses of lateral leans for ADLs.  Completed x5 abdominal strengthening activities, reclining back on large wedged pillow and to standard pillows and required to come sitting upright. Oly able to complete 5 with increased time while  preventing compensatory strategies.  She completed min A sliding board transfer back to w/c in same manner as described above. She returned to room and left seated with all needs in reach.  Education/discusiion throughtout session regarding pt's PLOF, old prosthetic and modified ADLs.   Therapy Documentation Precautions:  Precautions Precautions: Fall Precaution Comments: now bilateral amputee with change to CoG causing increased posterior lean Restrictions Weight Bearing Restrictions: Yes RLE Weight Bearing: Non weight bearing LLE Weight Bearing: Non weight bearing   Therapy/Group: Individual Therapy  Roshan Salamon L 01/27/2019, 6:58 AM

## 2019-01-27 NOTE — Plan of Care (Signed)
  Problem: Consults Goal: RH LIMB LOSS PATIENT EDUCATION Description Description: See Patient Education module for eduction specifics. Outcome: Progressing Goal: Skin Care Protocol Initiated - if Braden Score 18 or less Description If consults are not indicated, leave blank or document N/A Outcome: Progressing Goal: Diabetes Guidelines if Diabetic/Glucose > 140 Description If diabetic or lab glucose is > 140 mg/dl - Initiate Diabetes/Hyperglycemia Guidelines & Document Interventions  Outcome: Progressing   Problem: RH BOWEL ELIMINATION Goal: RH STG MANAGE BOWEL W/MEDICATION W/ASSISTANCE Description STG Manage Bowel with Medication with mod I Assistance.  Outcome: Progressing   Problem: RH BLADDER ELIMINATION Goal: RH STG MANAGE BLADDER WITH ASSISTANCE Description STG Manage Bladder With mod I Assistance  Outcome: Progressing   Problem: RH SKIN INTEGRITY Goal: RH STG ABLE TO PERFORM INCISION/WOUND CARE W/ASSISTANCE Description STG Able To Perform Incision/Wound Care With mod I Assistance.  Outcome: Progressing   Problem: RH SAFETY Goal: RH STG ADHERE TO SAFETY PRECAUTIONS W/ASSISTANCE/DEVICE Description STG Adhere to Safety Precautions With supervision Assistance/Device.  Outcome: Progressing   Problem: RH PAIN MANAGEMENT Goal: RH STG PAIN MANAGED AT OR BELOW PT'S PAIN GOAL Description < 5  Outcome: Progressing

## 2019-01-27 NOTE — Progress Notes (Signed)
Physical Therapy Session Note  Patient Details  Name: Danielle Buchanan MRN: [id number] Date of Birth: August 09, 1956  Today's Date: 01/27/2019 PT Individual Time: 1130-1220; 1330-1400 PT Individual Time Calculation (min): 50 min and 30 min Missed Time: 10 min Missed Time Reason: pain  Short Term Goals: Week 1:  PT Short Term Goal 1 (Week 1): pt will perform functional transfers with min A PT Short Term Goal 2 (Week 1): pt will demo dynamic sitting balance for therapeutic activities with supervision  Skilled Therapeutic Interventions/Progress Updates:    Session 1: Pt received seated on commode in room with NT assisting her. Pt is max A x 2 for pulling up pants while seated on commode with lateral leans. Pt is max A x 2 for slide board transfer from bedside commode to w/c. Manual w/c propulsion x 150 ft with use of BUE and Supervision. Slide board transfer w/c to/from mat table with min A with increased time and cues needed to perform transfer correctly and for head/hips relationship. Car transfer via slide board with mod A with increased time and cues needed to perform transfer correctly. Pt reporting significant pain in RLE at end of session, RN notified and will check on pain medication for patient. Pt missed 10 min at end of session due to pain. Pt left seated in w/c in room with needs in reach setup for lunch.  Session 2: Pt received seated in w/c in room, agreeable to PT session. Pt reports pain in improved from AM session, not rated. Manual w/c propulsion x 150 ft with use of BUE and Supervision. Slide board transfer w/c to mat table to the L with CGA, significantly increased time needed to complete transfer (10 min) and v/c for transfer technique. Slide board transfer back to the to the R with CGA and decreased time needed going this direction and back into w/c. Pt requests to return to bed at end of session. Slide board transfer back to bed min A due to time constraints. Sit to supine CGA. Pt  left semi-reclined in bed with needs in reach, bed alarm in place.   Therapy Documentation Precautions:  Precautions Precautions: Fall Precaution Comments: now bilateral amputee with change to CoG causing increased posterior lean Restrictions Weight Bearing Restrictions: Yes RLE Weight Bearing: Non weight bearing LLE Weight Bearing: Non weight bearing    Therapy/Group: Individual Therapy   Peter Congo, PT, DPT  01/27/2019, 1:08 PM

## 2019-01-27 NOTE — Care Management (Signed)
[facility] Individual Statement of Services  Patient Name:  Danielle Buchanan  Date:  01/27/2019  Welcome to the [facility]patient [facility].  Our goal is to provide you with an individualized program based on your diagnosis and situation, designed to meet your specific needs.  With this comprehensive rehabilitation program, you will be expected to participate in at least 3 hours of rehabilitation therapies Monday-Friday, with modified therapy programming on the weekends.  Your rehabilitation program will include the following services:  Physical Therapy (PT), Occupational Therapy (OT), 24 hour per day rehabilitation nursing, Therapeutic Recreaction (TR), Neuropsychology, Case Management (Social Worker), Rehabilitation Medicine, Nutrition Services and Pharmacy Services  Weekly team conferences will be held on Tuesdays to discuss your progress.  Your Social Worker will talk with you frequently to get your input and to update you on team discussions.  Team conferences with you and your family in attendance may also be held.  Expected length of stay: 10-14 days   Overall anticipated outcome: mod independent  Depending on your progress and recovery, your program may change. Your Social Worker will coordinate services and will keep you informed of any changes. Your Social Worker's name and contact numbers are listed  below.  The following services may also be recommended but are not provided by the Inpatient [facility]:   Driving Evaluations  Home Health Rehabiltiation Services  Outpatient Rehabilitation Services   Arrangements will be made to provide these services after discharge if needed.  Arrangements include referral to agencies that provide these services.  Your insurance has been verified to be:  [employer] Your primary doctor is:  [name]  Pertinent information will be shared with your doctor and your insurance company.  Social Worker:   [place], [place] [phone] or ([phone]   Information discussed with and copy given to patient by: Amada Jupiter, 01/27/2019, 11:30 AM

## 2019-01-27 NOTE — Progress Notes (Signed)
[facility] PHYSICAL MEDICINE & REHABILITATION PROGRESS NOTE   Subjective/Complaints: No new issues. Had more pain last night. Didn't sleep all that well  ROS: Patient denies fever, rash, sore throat, blurred vision, nausea, vomiting, diarrhea, cough, shortness of breath or chest pain, , headache, or mood change.  .   Objective:   No results found. Recent Labs    01/26/19 0653  WBC 8.4  HGB 12.1  HCT 37.3  PLT 250   Recent Labs    01/26/19 0653  NA 138  K 3.8  CL 101  CO2 18*  GLUCOSE 110*  BUN 15  CREATININE 0.73  CALCIUM 9.3   No intake or output data in the 24 hours ending 01/27/19 0850   Physical Exam: Vital Signs Blood pressure (!) 156/76, pulse 65, temperature 98.3 F (36.8 C), resp. rate 18, height 5\' 3"  (1.6 m), weight 92.7 kg, SpO2 97 %. Constitutional: No distress . Vital signs reviewed. HEENT: EOMI, oral membranes moist Neck: supple Cardiovascular: RRR without murmur. No JVD    Respiratory: CTA Bilaterally without wheezes or rales. Normal effort    GI: BS +, non-tender, non-distended  Musculoskeletal:  Comments: Right BK remains tender Neurological: She is oriented to person, place, and time. Nocranial nerve deficit.Coordinationnormal. Reasonable insight and awareness  Skin: Skin iswarm. AKA well-healed. Right BKA in ACE wrap  Psychiatric: flat   Assessment/Plan: 1. Functional deficits secondary to right BKA which require 3+ hours per day of interdisciplinary therapy in a comprehensive inpatient rehab setting.  Physiatrist is providing close team supervision and 24 hour management of active medical problems listed below.  Physiatrist and rehab team continue to assess barriers to discharge/monitor patient progress toward functional and medical goals  Care Tool:  Bathing    Body parts bathed by patient: Right arm, Left arm, Chest, Abdomen, Front perineal area, Buttocks, Right upper leg, Left upper leg, Face     Body parts n/a: Right  lower leg, Left lower leg   Bathing assist Assist Level: Minimal Assistance - Patient > 75%     Upper Body Dressing/Undressing Upper body dressing   What is the patient wearing?: Pull over shirt    Upper body assist Assist Level: Supervision/Verbal cueing    Lower Body Dressing/Undressing Lower body dressing      What is the patient wearing?: Pants, Ace wrap/stump shrinker     Lower body assist Assist for lower body dressing: Minimal Assistance - Patient > 75%     Toileting Toileting    Toileting assist Assist for toileting: Maximal Assistance - Patient 25 - 49%     Transfers Chair/bed transfer  Transfers assist     Chair/bed transfer assist level: Moderate Assistance - Patient 50 - 74%     Locomotion Ambulation   Ambulation assist   Ambulation activity did not occur: N/A(bilat amputee)          Walk 10 feet activity   Assist  Walk 10 feet activity did not occur: N/A        Walk 50 feet activity   Assist Walk 50 feet with 2 turns activity did not occur: N/A         Walk 150 feet activity   Assist Walk 150 feet activity did not occur: N/A         Walk 10 feet on uneven surface  activity   Assist Walk 10 feet on uneven surfaces activity did not occur: N/A         Wheelchair  Assist Will patient use wheelchair at discharge?: Yes Type of Wheelchair: Manual    Wheelchair assist level: Supervision/Verbal cueing Max wheelchair distance: 150'    Wheelchair 50 feet with 2 turns activity    Assist        Assist Level: Supervision/Verbal cueing   Wheelchair 150 feet activity     Assist Wheelchair 150 feet activity did not occur: Safety/medical concerns   Assist Level: Supervision/Verbal cueing    Medical Problem List and Plan: 1.Decreased functional mobilitysecondary to right BKA 01/19/2019 as well as history of left AKA 2018 -Continue CIR therapies including PT, OT  -team conference  today   2. Antithrombotics: -DVT/anticoagulation:Subcutaneous heparin -antiplatelet therapy: aspirin 81 mg daily  -no changes today 3. Pain Management:Elavil 25 mg daily at bedtime,Neurontin 300 mg daily at bedtime,oxycodone as needed -continue massage and desensitization for R BKA 4. Mood:trazodone 50 mg daily at bedtime, Zoloft 100 mg daily, Ativan 0.5 mg twice a day as needed -antipsychotic agents: N/A 5. Neuropsych: This patientiscapable of making decisions on herown behalf. 6. Skin/Wound Care:Routine skin checks  -ACE wrap 4", telfa---continue to change daily 7. Fluids/Electrolytes/Nutrition:Routine ins and outs with follow-up chemistries 8. CAD. Continue aspirin therapy. No chest pain 9. Diabetes mellitus with peripheral neuropathy. Hemoglobin A1c 8.5.  -cbg's improving -NovoLog 70/30 70 units at breakfast 60 units at supper -Glucophage 500 mg daily---increased to bid --continue for now  10. Hypertension. Cozaar 100 mg daily. Monitor with increased mobility 11. Tobacco abuse. Provide counseling 12. Pravachol 40 mg daily 13. Constipation. Laxative assistance    LOS: 4 days A FACE TO FACE EVALUATION WAS PERFORMED  Ranelle OysterZachary T Amy Gothard 01/27/2019, 8:50 AM

## 2019-01-28 ENCOUNTER — Inpatient Hospital Stay (HOSPITAL_COMMUNITY): Payer: Medicare HMO | Admitting: Physical Therapy

## 2019-01-28 ENCOUNTER — Inpatient Hospital Stay (HOSPITAL_COMMUNITY): Payer: Medicare HMO

## 2019-01-28 ENCOUNTER — Inpatient Hospital Stay (HOSPITAL_COMMUNITY): Payer: Medicare HMO | Admitting: Occupational Therapy

## 2019-01-28 LAB — GLUCOSE, CAPILLARY
Glucose-Capillary: 168 mg/dL — ABNORMAL HIGH (ref 70–99)
Glucose-Capillary: 102 mg/dL — ABNORMAL HIGH (ref 70–99)
Glucose-Capillary: 149 mg/dL — ABNORMAL HIGH (ref 70–99)
Glucose-Capillary: 172 mg/dL — ABNORMAL HIGH (ref 70–99)

## 2019-01-28 NOTE — Progress Notes (Signed)
[facility] PHYSICAL MEDICINE & REHABILITATION PROGRESS NOTE   Subjective/Complaints: Slept well. States she has no pain this morning.   ROS: Patient denies fever, rash, sore throat, blurred vision, nausea, vomiting, diarrhea, cough, shortness of breath or chest pain, joint or back pain, headache, or mood change. .   Objective:   No results found. Recent Labs    01/26/19 0653  WBC 8.4  HGB 12.1  HCT 37.3  PLT 250   Recent Labs    01/26/19 0653  NA 138  K 3.8  CL 101  CO2 18*  GLUCOSE 110*  BUN 15  CREATININE 0.73  CALCIUM 9.3    Intake/Output Summary (Last 24 hours) at 01/28/2019 0842 Last data filed at 01/27/2019 1822 Gross per 24 hour  Intake 540 ml  Output -  Net 540 ml     Physical Exam: Vital Signs Blood pressure 140/75, pulse 66, temperature 98 F (36.7 C), temperature source Oral, resp. rate 18, height 5\' 3"  (1.6 m), weight 93.2 kg, SpO2 93 %. Constitutional: No distress . Vital signs reviewed. HEENT: EOMI, oral membranes moist Neck: supple Cardiovascular: RRR without murmur. No JVD    Respiratory: CTA Bilaterally without wheezes or rales. Normal effort    GI: BS +, non-tender, non-distended   Musculoskeletal:  Comments: Right BK less tender to palp Neurological: She is oriented to person, place, and time. Nocranial nerve deficit.Coordinationnormal. Reasonable insight and awareness  Skin: Skin iswarm. AKA well-healed. Right BKA in ACE wrap---dressing wasn't removed today  Psychiatric: cooperative   Assessment/Plan: 1. Functional deficits secondary to right BKA which require 3+ hours per day of interdisciplinary therapy in a comprehensive inpatient rehab setting.  Physiatrist is providing close team supervision and 24 hour management of active medical problems listed below.  Physiatrist and rehab team continue to assess barriers to discharge/monitor patient progress toward functional and medical goals  Care Tool:  Bathing    Body  parts bathed by patient: Right arm, Left arm, Chest, Abdomen, Front perineal area, Buttocks, Right upper leg, Left upper leg, Face     Body parts n/a: Right lower leg, Left lower leg   Bathing assist Assist Level: Minimal Assistance - Patient > 75%     Upper Body Dressing/Undressing Upper body dressing   What is the patient wearing?: Pull over shirt    Upper body assist Assist Level: Supervision/Verbal cueing    Lower Body Dressing/Undressing Lower body dressing      What is the patient wearing?: Pants, Ace wrap/stump shrinker     Lower body assist Assist for lower body dressing: Minimal Assistance - Patient > 75%     Toileting Toileting    Toileting assist Assist for toileting: Maximal Assistance - Patient 25 - 49%     Transfers Chair/bed transfer  Transfers assist     Chair/bed transfer assist level: Minimal Assistance - Patient > 75%     Locomotion Ambulation   Ambulation assist   Ambulation activity did not occur: N/A(bilat amputee)          Walk 10 feet activity   Assist  Walk 10 feet activity did not occur: N/A        Walk 50 feet activity   Assist Walk 50 feet with 2 turns activity did not occur: N/A         Walk 150 feet activity   Assist Walk 150 feet activity did not occur: N/A         Walk 10 feet on uneven surface  activity   Assist Walk 10 feet on uneven surfaces activity did not occur: N/A         Wheelchair     Assist Will patient use wheelchair at discharge?: Yes Type of Wheelchair: Manual    Wheelchair assist level: Supervision/Verbal cueing Max wheelchair distance: 150'    Wheelchair 50 feet with 2 turns activity    Assist        Assist Level: Supervision/Verbal cueing   Wheelchair 150 feet activity     Assist Wheelchair 150 feet activity did not occur: Safety/medical concerns   Assist Level: Supervision/Verbal cueing    Medical Problem List and Plan: 1.Decreased functional  mobilitysecondary to right BKA 01/19/2019 as well as history of left AKA 2018 -Continue CIR therapies including PT, OT  -supervision goals 2. Antithrombotics: -DVT/anticoagulation:Subcutaneous heparin -antiplatelet therapy: aspirin 81 mg daily  -no changes today 3. Pain Management:Elavil 25 mg daily at bedtime,Neurontin 300 mg daily at bedtime,oxycodone as needed -continue massage and desensitization for R BKA 4. Mood:trazodone 50 mg daily at bedtime, Zoloft 100 mg daily, Ativan 0.5 mg twice a day as needed -antipsychotic agents: N/A 5. Neuropsych: This patientiscapable of making decisions on herown behalf. 6. Skin/Wound Care:Routine skin checks  -ACE wrap 4", telfa---continue to change daily 7. Fluids/Electrolytes/Nutrition:Routine ins and outs with follow-up chemistries 8. CAD. Continue aspirin therapy. No chest pain 9. Diabetes mellitus with peripheral neuropathy. Hemoglobin A1c 8.5.  -cbg's with improvement -NovoLog 70/30 70 units at breakfast 60 units at supper -Glucophage 500 mg daily---increased to bid --monitor for trend  10. Hypertension. Cozaar 100 mg daily. Monitor with increased mobility 11. Tobacco abuse. Provide counseling 12. Pravachol 40 mg daily 13. Constipation. Laxative assistance    LOS: 5 days A FACE TO FACE EVALUATION WAS PERFORMED  Ranelle OysterZachary T  01/28/2019, 8:42 AM

## 2019-01-28 NOTE — Progress Notes (Signed)
Physical Therapy Session Note  Patient Details  Name: Danielle Buchanan MRN: [id number] Date of Birth: [birth year]  Today's Date: 01/28/2019 PT Individual Time: 0900-1000 PT Individual Time Calculation (min): 60 min   Short Term Goals: Week 1:  PT Short Term Goal 1 (Week 1): pt will perform functional transfers with min A PT Short Term Goal 2 (Week 1): pt will demo dynamic sitting balance for therapeutic activities with supervision  Skilled Therapeutic Interventions/Progress Updates:    Pt received seated EOB, agreeable to PT session. Pt reports pain in R residual limb is controlled this AM. Slide board transfer bed to w/c to the L with close SBA, increased time and cueing needed to complete transfer. Manual w/c propulsion x 150 ft with use of BUE and Supervision. Pt requests to perform transfer to recliner as she likes to sit in hers at home. Slide board transfer w/c to recliner with CGA going downhill. Pt requires max A x 2 to slide from recliner back into w/c due to height difference. Slide board w/c to/from mat table with CGA. Seated balance and core strengthening while seated EOM: volleyball reaching outside BOS to return ball to therapist. Pt has several LOB posteriorly but is able to catch herself with use of BUE. Pt left seated in w/c in room with needs in reach at end of session.  Therapy Documentation Precautions:  Precautions Precautions: Fall Precaution Comments: now bilateral amputee with change to CoG causing increased posterior lean Restrictions Weight Bearing Restrictions: Yes RLE Weight Bearing: Non weight bearing LLE Weight Bearing: Non weight bearing    Therapy/Group: Individual Therapy   Peter Congo, PT, DPT  01/28/2019, 12:42 PM

## 2019-01-28 NOTE — Progress Notes (Signed)
Physical Therapy Session Note  Patient Details  Name: Danielle Buchanan MRN: [id number] Date of Birth: Jan 12, 1956  Today's Date: 01/28/2019 PT Individual Time: 1430-1455 PT Individual Time Calculation (min): 25 min   Short Term Goals: Week 1:  PT Short Term Goal 1 (Week 1): pt will perform functional transfers with min A PT Short Term Goal 2 (Week 1): pt will demo dynamic sitting balance for therapeutic activities with supervision  Skilled Therapeutic Interventions/Progress Updates:    Patient received in [facility], pleasant and willing to participate in PT, but reporting high levels of fatigue today. Able to set up [facility] for transfer with S and required MinA for placement of SB, then able to transfer to bed with MinA. Performed sit to supine with S-min guard and then focused session on functional exercises in bed including quad sets, SLR with quad set, supine isometric hip extensions, sidelying hip ABD, sidelying hip extensions, chest presses with 5#, B UE flexion with 3#, and scapular retractions with lime therband. She was left in bed with all needs met, bed alarm active this afternoon.   Therapy Documentation Precautions:  Precautions Precautions: Fall Precaution Comments: now bilateral amputee with change to CoG causing increased posterior lean Restrictions Weight Bearing Restrictions: Yes RLE Weight Bearing: Non weight bearing LLE Weight Bearing: Non weight bearing Pain: Pain Assessment Pain Scale: 0-10 Pain Score: 7  Pain Type: Surgical pain;Acute pain Pain Location: Leg Pain Orientation: Right Pain Descriptors / Indicators: Aching;Sore Pain Frequency: Intermittent Pain Onset: On-going Patients Stated Pain Goal: 0 Pain Intervention(s): Medication (See eMAR);Repositioned Multiple Pain Sites: No    Therapy/Group: Individual Therapy   Deniece Ree PT, DPT, CBIS  Supplemental Physical Therapist [facility]    Pager [phone] Acute Rehab Office [phone]   01/28/2019,  4:15 PM

## 2019-01-28 NOTE — Progress Notes (Signed)
Patient states 'Im so upset the therapist dropped that heavy sliding board on me today, trying to get me back to bed",.States the board landed on her surgical leg," its still hurts".assesed area with no acute area of concerns noted.ay this time. Patient has been medicated for generalized discomfort of pain, repositioned in bed, call bell within reach, bed alarm on.,

## 2019-01-28 NOTE — Patient Care Conference (Signed)
Inpatient RehabilitationTeam Conference and Plan of Care Update Date: 01/27/2019   Time: 2:15 PM    Patient Name: Danielle Buchanan      Medical Record Number: [id number]  Date of Birth: [birth year] Sex: Female         Room/Bed: 4W17C/4W17C-01 Payor Info: Payor: [employer] MEDICARE / Plan: [employer] MEDICARE HMO / Product Type: *No Product type* /    Admitting Diagnosis: R BKA  Admit Date/Time:  01/23/2019  2:37 PM Admission Comments: No comment available   Primary Diagnosis:  <principal problem not specified> Principal Problem: <principal problem not specified>  Patient Active Problem List   Diagnosis Date Noted  . Right below-knee amputee (HCC) 01/23/2019  . Gangrene of right foot (HCC) 01/17/2019  . History of transmetatarsal amputation of right foot (HCC) 12/13/2018  . At risk for adverse drug event 11/27/2018  . Osteomyelitis of third toe of right foot (HCC) 11/12/2018  . Pain in finger of right hand 02/20/2018  . Atherosclerosis of native arteries of the extremities with gangrene (HCC) 11/01/2017  . Unilateral AKA, left (HCC)   . Post-operative pain   . OSA (obstructive sleep apnea)   . Benign essential HTN   . DM (diabetes mellitus), secondary, uncontrolled, with peripheral vascular complications (HCC)   . Hyperkalemia   . Leukocytosis   . Acute blood loss anemia   . Amputation stump infection (HCC) 08/23/2017  . Amputation of left lower extremity above knee upon examination (HCC) 08/23/2017  . PAD (peripheral artery disease) (HCC) 07/23/2017  . Cellulitis 06/10/2017  . Hyponatremia 06/10/2017  . Fever 06/10/2017  . Rash 05/24/2017  . HTN (hypertension)   . Allergic rhinitis   . Insomnia   . Depression     Expected Discharge Date: Expected Discharge Date: 02/06/19  Team Members Present: Physician leading conference: Dr. Faith Rogue Social Worker Present: Amada Jupiter, LCSW Nurse Present: Otilio Carpen, LPN PT Present: Other (comment)(Taylor Serita Grit, PT) OT Present: Amy  Rounds, OT PPS Coordinator present : Fae Pippin     Current Status/Progress Goal Weekly Team Focus  Medical   right bka, hx of left aka, type 2 diabetes with improved control. wound care issues  maximize pain control  wound care, pain mgt, dm control   Bowel/Bladder   Continent/incontinent of bowel/bladder; LBM 01/24/19  Maintain normal bowel/bladder with Mod I  Assist with bowel/bladder needs PRN   Swallow/Nutrition/ Hydration             ADL's   Mod A sliding board transfers, bed level ADLs with min A, total A toileting from bed level  Supervision-mod I overall  Functional transfers, functional sitting balance, ADL re-trianing, activity tolerance   Mobility   mod A slide board transfers, Supervision w/c mobility, max/dependent to don LLE prosthesis  mod I transfers, min A car transfer  transfers, sitting balance, donning prosthesis   Communication             Safety/Cognition/ Behavioral Observations            Pain   Complains of leg pain, Oxy IR PRN on-board  <2  Assess and treat pain q shift and as needed   Skin   Surgical incision to right leg with ace wrap   Maintain skin integrity with Mod I  Assess skin q shift and as needed    Rehab Goals Patient on target to meet rehab goals: Yes *See Care Plan and progress notes for long and short-term goals.     Barriers to Discharge  Current Status/Progress Possible Resolutions Date Resolved   Physician    Medical stability;Wound Care        management of medical issues above      Nursing                  PT  Decreased caregiver support                 OT                  SLP                SW                Discharge Planning/Teaching Needs:  Pt plans to return to her handicap accessible apartment.  Neice and her spouse are local and available to provide assist.  Need to confirm if they can provide 24/7 if recommended.  Teaching needs still TBD   Team Discussion:  New BKA with old AKA and ?TBI.  +/- cont and  with urgency.  Pain management issues.  Min - mod assist overall with therapies.  Slide board tfs and requires max cues for all mobility and ADLs.  Initial goals set for mod independent, however, plan to downgrade to supervision.  SW to follow up to confirm 24/7 can be provided.  Revisions to Treatment Plan:  Plan to downgrade overall goals to supervision    Continued Need for Acute Rehabilitation Level of Care: The patient requires daily medical management by a physician with specialized training in physical medicine and rehabilitation for the following conditions: Daily direction of a multidisciplinary physical rehabilitation program to ensure safe treatment while eliciting the highest outcome that is of practical value to the patient.: Yes Daily medical management of patient stability for increased activity during participation in an intensive rehabilitation regime.: Yes Daily analysis of laboratory values and/or radiology reports with any subsequent need for medication adjustment of medical intervention for : Diabetes problems;Post surgical problems;Wound care problems   I attest that I was present, lead the team conference, and concur with the assessment and plan of the team.   Amada JupiterHOYLE, Audie Stayer 01/28/2019, 10:37 AM     Team conference was held via web/ teleconference due to COVID - 19

## 2019-01-28 NOTE — Progress Notes (Signed)
Occupational Therapy Session Note  Patient Details  Name: Danielle Buchanan MRN: [id number] Date of Birth: 06/25/1956  Today's Date: 01/28/2019 OT Individual Time: 1230-1340 OT Individual Time Calculation (min): 70 min    Short Term Goals: Week 1:  OT Short Term Goal 1 (Week 1): Pt will completes transfer to DAC/toilet wiht LRAD and min A OT Short Term Goal 2 (Week 1): Pt will complete shirt donning with supervision OT Short Term Goal 3 (Week 1): Pt will complete lateral leans wiht supervision during LB dressing OT Short Term Goal 4 (Week 1): Pt will sit EOM during dynamic sitting balance task for 10 min wiht supervision  Skilled Therapeutic Interventions/Progress Updates:    Pt received sitting up in w/c with c/o pain in R residual limb- 7/10. Pt wishing to not take pain medication d/t side effects, stating she would like to continue with therapy. Pt propelled w/c to therapy gym 130 ft with min A. Pt used slideboard to transfer w/c > therapy mat with min A. Pt sat EOM and completed dynamic throwing task to challenge BUE strength and sitting balance. Pt transitioned to prone on mat with min A. In prone, pt completed 3x5 push ups into back extension with a focus on tricep control and proper residual limb positioning. Pt transitioned back to sitting EOM with min A. Pt completed core stabilization exercises with cueing for proper engagement and activation. Pt reported needing to use the bathroom and transferred back to her chair via SB with mod A. Pt was propelled to room and BSC set up. Pt transferred with mod A, and required min A to doff pants via lateral leans. Pt voided urine and completed peri hygiene seated. Pt required mod A to don brief and pants seated. Pt transferred back to w/c and was left sitting up with all needs met.   Therapy Documentation Precautions:  Precautions Precautions: Fall Precaution Comments: now bilateral amputee with change to CoG causing increased posterior  lean Restrictions Weight Bearing Restrictions: Yes RLE Weight Bearing: Non weight bearing LLE Weight Bearing: Non weight bearing   Therapy/Group: Individual Therapy  Curtis Sites 01/28/2019, 12:50 PM

## 2019-01-28 NOTE — Progress Notes (Signed)
Social Work Patient ID: Danielle Buchanan, female   DOB: [birth year], 63 y.o.   MRN: [id number]   Have reviewed team conference with pt and nephew, Tammy Sours.  Both aware and agreeable with targeted d/c date of 5/1 and supervision goals overall.  Nephew confirms that he and his wife can provide 24/7 support and are very encouraged by pt's current progress.  Pt concerned, "what if I'm not ready?" -provided support and explained that we will monitor her progress and adjust as needed/ if needed and appropriate.  Continue to follow.    Pancho Rushing, LCSW

## 2019-01-28 NOTE — Progress Notes (Signed)
Occupational Therapy Session Note  Patient Details  Name: Danielle Buchanan MRN: [id number] Date of Birth: 1956/03/26  Today's Date: 01/28/2019 OT Individual Time: 1045-1130 OT Individual Time Calculation (min): 45 min    Short Term Goals: Week 1:  OT Short Term Goal 1 (Week 1): Pt will completes transfer to DAC/toilet wiht LRAD and min A OT Short Term Goal 2 (Week 1): Pt will complete shirt donning with supervision OT Short Term Goal 3 (Week 1): Pt will complete lateral leans wiht supervision during LB dressing OT Short Term Goal 4 (Week 1): Pt will sit EOM during dynamic sitting balance task for 10 min wiht supervision      Skilled Therapeutic Interventions/Progress Updates:    Pt received in w/c ready for therapy.Marland Kitchen discussed the challenges she is having with the slide board to her [facility].  She stated she has a shorter board at home. Asked her to ask her family to bring that board to the hospital for her to have to use as a long board is challenging to the [facility].  Pt taken to gym and worked on board transfer to her R to mat that was slightly higher than the w/c.  She needed mod A at times to scoot hips along.  Coming back to w/c to L, pt was able to do so with close S.  On mat, pt worked on lateral leans, hips lifts using push up blocks to simulate bars on BSC and hip lifts reaching hand under thigh to practice movement needed for clothing management on BSC.  UE AROM exercises on mat for balance and endurance.     Pt taken back to room with all needs met.   Therapy Documentation Precautions:  Precautions Precautions: Fall Precaution Comments: now bilateral amputee with change to CoG causing increased posterior lean Restrictions Weight Bearing Restrictions: Yes RLE Weight Bearing: Non weight bearing LLE Weight Bearing: Non weight bearing   Pain: Pain Assessment Pain Scale: 0-10 Pain Score: 5  Pain Type: Acute pain;Surgical pain Pain Location: Leg Pain Orientation: Right Pain  Descriptors / Indicators: Aching Pain Frequency: Intermittent Pain Onset: On-going Pain Intervention(s): Medication (See eMAR)   Therapy/Group: Individual Therapy  Bellefontaine Neighbors 01/28/2019, 12:35 PM

## 2019-01-29 ENCOUNTER — Inpatient Hospital Stay (HOSPITAL_COMMUNITY): Payer: Medicare HMO | Admitting: Occupational Therapy

## 2019-01-29 ENCOUNTER — Inpatient Hospital Stay (HOSPITAL_COMMUNITY): Payer: Medicare HMO | Admitting: Physical Therapy

## 2019-01-29 LAB — GLUCOSE, CAPILLARY
Glucose-Capillary: 194 mg/dL — ABNORMAL HIGH (ref 70–99)
Glucose-Capillary: 187 mg/dL — ABNORMAL HIGH (ref 70–99)
Glucose-Capillary: 107 mg/dL — ABNORMAL HIGH (ref 70–99)
Glucose-Capillary: 151 mg/dL — ABNORMAL HIGH (ref 70–99)

## 2019-01-29 NOTE — Progress Notes (Addendum)
[place] PHYSICAL MEDICINE & REHABILITATION PROGRESS NOTE   Subjective/Complaints: No new problems overnight. Pain up and down in leg. Slept soundly  ROS: Patient denies fever, rash, sore throat, blurred vision, nausea, vomiting, diarrhea, cough, shortness of breath or chest pain, joint or back pain, headache, or mood change.  .   Objective:   No results found. No results for input(s): WBC, HGB, HCT, PLT in the last 72 hours. No results for input(s): NA, K, CL, CO2, GLUCOSE, BUN, CREATININE, CALCIUM in the last 72 hours.  Intake/Output Summary (Last 24 hours) at 01/29/2019 0853 Last data filed at 01/28/2019 1858 Gross per 24 hour  Intake 360 ml  Output -  Net 360 ml     Physical Exam: Vital Signs Blood pressure (!) 147/66, pulse 66, temperature 98 F (36.7 C), temperature source Oral, resp. rate 15, height 5\' 3"  (1.6 m), weight 93.2 kg, SpO2 90 %. Constitutional: No distress . Vital signs reviewed. HEENT: EOMI, oral membranes moist Neck: supple Cardiovascular: RRR without murmur. No JVD    Respiratory: CTA Bilaterally without wheezes or rales. Normal effort    GI: BS +, non-tender, non-distended  Musculoskeletal:  Comments: Right BK was tender with manipulation today Neurological: She is oriented to person, place, and time. Nocranial nerve deficit.Coordinationnormal. Reasonable insight and awareness  Skin: Skin iswarm. AKA well-healed. Right BK incision CDI with staples. Minimal serous drainage on dressing. Small incisional bruising Psychiatric: cooperative   Assessment/Plan: 1. Functional deficits secondary to right BKA which require 3+ hours per day of interdisciplinary therapy in a comprehensive inpatient rehab setting.  Physiatrist is providing close team supervision and 24 hour management of active medical problems listed below.  Physiatrist and rehab team continue to assess barriers to discharge/monitor patient progress toward functional and medical  goals  Care Tool:  Bathing    Body parts bathed by patient: Right arm, Left arm, Chest, Abdomen, Front perineal area, Buttocks, Right upper leg, Left upper leg, Face     Body parts n/a: Right lower leg, Left lower leg   Bathing assist Assist Level: Minimal Assistance - Patient > 75%     Upper Body Dressing/Undressing Upper body dressing   What is the patient wearing?: Pull over shirt    Upper body assist Assist Level: Supervision/Verbal cueing    Lower Body Dressing/Undressing Lower body dressing      What is the patient wearing?: Pants, Ace wrap/stump shrinker     Lower body assist Assist for lower body dressing: Minimal Assistance - Patient > 75%     Toileting Toileting    Toileting assist Assist for toileting: Maximal Assistance - Patient 25 - 49%     Transfers Chair/bed transfer  Transfers assist     Chair/bed transfer assist level: Minimal Assistance - Patient > 75%     Locomotion Ambulation   Ambulation assist   Ambulation activity did not occur: N/A(bilat amputee)          Walk 10 feet activity   Assist  Walk 10 feet activity did not occur: N/A        Walk 50 feet activity   Assist Walk 50 feet with 2 turns activity did not occur: N/A         Walk 150 feet activity   Assist Walk 150 feet activity did not occur: N/A         Walk 10 feet on uneven surface  activity   Assist Walk 10 feet on uneven surfaces activity did not occur: N/A  Wheelchair     Assist Will patient use wheelchair at discharge?: Yes Type of Wheelchair: Manual    Wheelchair assist level: Supervision/Verbal cueing Max wheelchair distance: 150'    Wheelchair 50 feet with 2 turns activity    Assist        Assist Level: Supervision/Verbal cueing   Wheelchair 150 feet activity     Assist Wheelchair 150 feet activity did not occur: Safety/medical concerns   Assist Level: Supervision/Verbal cueing    Medical Problem  List and Plan: 1.Decreased functional mobilitysecondary to right BKA 01/19/2019 as well as history of left AKA 2018 -Continue CIR therapies including PT, OT    2. Antithrombotics: -DVT/anticoagulation:Subcutaneous heparin -antiplatelet therapy: aspirin 81 mg daily  -no changes today 3. Pain Management:Elavil 25 mg daily at bedtime,Neurontin 300 mg daily at bedtime,oxycodone as needed -continue massage and desensitization for R BKA  -continue edema control with ACE 4. Mood:trazodone 50 mg daily at bedtime, Zoloft 100 mg daily, Ativan 0.5 mg twice a day as needed -antipsychotic agents: N/A 5. Neuropsych: This patientiscapable of making decisions on herown behalf. 6. Skin/Wound Care:Routine skin checks  -ACE wrap 4", telfa---I changed dressing today  -consider shrinker next week 7. Fluids/Electrolytes/Nutrition: encourage po 8. CAD. Continue aspirin therapy. No chest pain 9. Diabetes mellitus with peripheral neuropathy. Hemoglobin A1c 8.5.  -cbg's with improvement but no consistent pattern -NovoLog 70/30 70 units at breakfast 60 units at supper -Glucophage XR 1000mg  daily  -hesitate to make changes right now given inconsistent numbers 10. Hypertension. Cozaar 100 mg daily. Monitor with increased mobility 11. Tobacco abuse. Provide counseling 12. Pravachol 40 mg daily 13. Constipation. Laxative assistance    LOS: 6 days A FACE TO FACE EVALUATION WAS PERFORMED  Ranelle Oyster 01/29/2019, 8:53 AM

## 2019-01-29 NOTE — Progress Notes (Signed)
Pt reports that does not have socket for right BKA.

## 2019-01-29 NOTE — Progress Notes (Signed)
Occupational Therapy Session Note  Patient Details  Name: Danielle Buchanan MRN: [id number] Date of Birth: 05-21-1956  Today's Date: 01/29/2019 OT Individual Time: 1230-1300 OT Individual Time Calculation (min): 30 min    Short Term Goals: Week 1:  OT Short Term Goal 1 (Week 1): Pt will completes transfer to DAC/toilet wiht LRAD and min A OT Short Term Goal 2 (Week 1): Pt will complete shirt donning with supervision OT Short Term Goal 3 (Week 1): Pt will complete lateral leans wiht supervision during LB dressing OT Short Term Goal 4 (Week 1): Pt will sit EOM during dynamic sitting balance task for 10 min wiht supervision  Skilled Therapeutic Interventions/Progress Updates:    Treatment session with focus on functional transfers and lateral leans.  Upon arrival, pt reports excruciating pain in Rt residual limb - pain meds not due yet.  Discussed pain management strategies to combine with pain meds.  Attempted slide board transfer to wide drop arm BSC, however due to pain pt unable to complete enough of a scoot to complete transfer.  Therefore engaged in lateral scoots Rt and Lt on EOB.  Pt required mod assist and increased time when scooting to Lt and CGA when scooting to Rt.  Educated on need for weight shift and anterior lean to complete transfers.  Pt returned to semi-reclined in bed and left with all needs in reach.  Therapy Documentation Precautions:  Precautions Precautions: Fall Precaution Comments: now bilateral amputee with change to CoG causing increased posterior lean Restrictions Weight Bearing Restrictions: Yes RLE Weight Bearing: Non weight bearing LLE Weight Bearing: Non weight bearing Pain: Pain Assessment Pain Scale: 0-10 Pain Score: 9  Pain Type: Acute pain;Surgical pain Pain Location: Incision Pain Orientation: Right Pain Descriptors / Indicators: Aching Pain Frequency: Constant Pain Onset: On-going Pain Intervention(s): Medication (See eMAR) Multiple Pain Sites:  No   Therapy/Group: Individual Therapy  Rosalio Loud 01/29/2019, 2:17 PM

## 2019-01-29 NOTE — Progress Notes (Signed)
Occupational Therapy Session Note  Patient Details  Name: Danielle Buchanan MRN: [id number] Date of Birth: 1956-09-03  Today's Date: 01/29/2019 OT Individual Time: 0930-1030 OT Individual Time Calculation (min): 60 min    Short Term Goals: Week 1:  OT Short Term Goal 1 (Week 1): Pt will completes transfer to DAC/toilet wiht LRAD and min A OT Short Term Goal 2 (Week 1): Pt will complete shirt donning with supervision OT Short Term Goal 3 (Week 1): Pt will complete lateral leans wiht supervision during LB dressing OT Short Term Goal 4 (Week 1): Pt will sit EOM during dynamic sitting balance task for 10 min wiht supervision  Skilled Therapeutic Interventions/Progress Updates:    Pt seen for OT session focusing on functional mobility and trnsfers. Pt in supine upon arrival, agreeable to tx session. Pain 7/10, RN in room providing pain meds upon arrival. Pt agreeable to tx session as able and repositioned and rest breaks provided throughout session 2/2 pain.  She transferred to sitting EOB from flet bed with min A. Pt then reported to therapist she plans to not use bed at d/c and sleep in recliner as she did PTA. She completed bed>w/c transfer with min A overall transferring to R with max VCs for head/hip relationship and proper positioning of board in prep for transfer. She completed grooming tasks from w/c level at sink mod I.  Cont working on [employer] transfers completing another set of w/c<> EOB. Mod A on first attempt to bed, then min A to return to w/c. Required same cuing and assist as noted above.  Discussed home-layout and routine. Pt reports she plans to use power scooter within home and to sleep in recliner. Recommending use of drop arm BSC until HHOT can assess home bathroom accessibility and transfer method. Pt plans to complete bathing/dresing routine from scooter level at d/c. Will practice this during next bathing/dressing OT session.  Pt left seated in w/c at end of session,  encouraged to stay sitting up in w/c in order to increase functional activity tolerance.  Therapy Documentation Precautions:  Precautions Precautions: Fall Precaution Comments: now bilateral amputee with change to CoG causing increased posterior lean Restrictions Weight Bearing Restrictions: Yes RLE Weight Bearing: Non weight bearing LLE Weight Bearing: Non weight bearing Pain:     Therapy/Group: Individual Therapy  Cassara Nida L 01/29/2019, 7:02 AM

## 2019-01-29 NOTE — Progress Notes (Signed)
Physical Therapy Session Note  Patient Details  Name: Danielle Buchanan MRN: [id number] Date of Birth: Sep 03, 1956  Today's Date: 01/29/2019 PT Individual Time: 1330-1435 PT Individual Time Calculation (min): 65 min   Short Term Goals: Week 1:  PT Short Term Goal 1 (Week 1): pt will perform functional transfers with min A PT Short Term Goal 2 (Week 1): pt will demo dynamic sitting balance for therapeutic activities with supervision  Skilled Therapeutic Interventions/Progress Updates:    Patient received in bed, fatigued but willing to participate in session. Performed bed mobility with S and use of railings/bed features, then required heavy ModA for sliding board transfer to chair, difficulty with head-hips relationship and UE lift up for transfer. Able to self-propel WC multiple distances of 1100f with S and extended time. [facility] with ModA and cues for head-hips relationship, then able to perform sit to supine with S. Worked on functional exercises appropriate for new BKA in supine, sidelying, and prone and introduced prone stretch for hip flexors today, tolerated well by patient. Note she did require ModA for supine to sit without use of railings. Worked on head-hips relationship with mod facilitation and cues for anterior translation of head/improved pushdown of UEs for lateral scooting along edge of mat table, very fatigued by this activity. Placed pillow case over sliding board to improve ease of transfers, again [place] [facility] but able to perform sliding board transfer back to bed with minA and pillow case on board. She declined further activity at this point due to fatigue. She was left in bed with all needs met, bed alarm active, 10 minutes of skilled therapy time missed due to fatigue.   Therapy Documentation Precautions:  Precautions Precautions: Fall Precaution Comments: now bilateral amputee with change to CoG causing  increased posterior lean Restrictions Weight Bearing Restrictions: Yes RLE Weight Bearing: Non weight bearing LLE Weight Bearing: Non weight bearing General: PT Amount of Missed Time (min): 10 Minutes PT Missed Treatment Reason: Patient fatigue Pain: Pain Assessment Pain Scale: 0-10 Pain Score: 9  Pain Type: Acute pain;Surgical pain Pain Location: Leg Pain Orientation: Right Pain Descriptors / Indicators: Aching;Sore Pain Frequency: Constant Pain Onset: On-going Patients Stated Pain Goal: 0 Pain Intervention(s): Medication (See eMAR) Multiple Pain Sites: No    Therapy/Group: Individual Therapy   KDeniece ReePT, DPT, CBIS  Supplemental Physical Therapist [facility]   Pager [phone] Rehab Office [phone]   01/29/2019, 3:41 PM

## 2019-01-29 NOTE — Progress Notes (Signed)
Physical Therapy Session Note  Patient Details  Name: Danielle Buchanan MRN: [id number] Date of Birth: April 26, 1956  Today's Date: 01/29/2019 PT Individual Time: 7482-7078 PT Individual Time Calculation (min): 25 min   Short Term Goals: Week 1:  PT Short Term Goal 1 (Week 1): pt will perform functional transfers with min A PT Short Term Goal 2 (Week 1): pt will demo dynamic sitting balance for therapeutic activities with supervision Week 2:     Skilled Therapeutic Interventions/Progress Updates:   Pt received supine in bed and agreeable to PT. Per pt request, perform bed level therex for UE and LE. 5#bar weight. Chest press, shoulder press, low row, high row, bicep curl all completed x 12 BLE. Hip abduction in supine bridges x 10 each BLE. Cues for full ROM and proper speed for each exercise. Pt left supine in bed with call bell in reach and all needs met.      Therapy Documentation Precautions:  Precautions Precautions: Fall Precaution Comments: now bilateral amputee with change to CoG causing increased posterior lean Restrictions Weight Bearing Restrictions: Yes RLE Weight Bearing: Non weight bearing LLE Weight Bearing: Non weight bearing  Vital Signs: Therapy Vitals Pulse Rate: 72 Resp: 19 BP: 113/66 Patient Position (if appropriate): Sitting Oxygen Therapy SpO2: 97 % O2 Device: Room Air Pain: Pain Assessment Pain Scale: 0-10 Pain Score: 7  Pain Type: Acute pain;Surgical pain Pain Location: Leg Pain Orientation: Right Pain Descriptors / Indicators: Aching Pain Frequency: Constant Pain Onset: On-going Patients Stated Pain Goal: 0 Pain Intervention(s): Medication (See eMAR) Multiple Pain Sites: No    Therapy/Group: Individual Therapy  Lorie Phenix 01/29/2019, 6:10 PM

## 2019-01-30 ENCOUNTER — Inpatient Hospital Stay (HOSPITAL_COMMUNITY): Payer: Medicare HMO | Admitting: Occupational Therapy

## 2019-01-30 ENCOUNTER — Ambulatory Visit: Payer: Medicare HMO | Admitting: Vascular Surgery

## 2019-01-30 ENCOUNTER — Inpatient Hospital Stay (HOSPITAL_COMMUNITY): Payer: Medicare HMO | Admitting: Physical Therapy

## 2019-01-30 LAB — GLUCOSE, CAPILLARY
Glucose-Capillary: 211 mg/dL — ABNORMAL HIGH (ref 70–99)
Glucose-Capillary: 115 mg/dL — ABNORMAL HIGH (ref 70–99)
Glucose-Capillary: 230 mg/dL — ABNORMAL HIGH (ref 70–99)
Glucose-Capillary: 130 mg/dL — ABNORMAL HIGH (ref 70–99)

## 2019-01-30 MED ORDER — INSULIN ASPART PROT & ASPART (70-30 MIX) 100 UNIT/ML ~~LOC~~ SUSP
65.0000 [IU] | Freq: Every day | SUBCUTANEOUS | Status: DC
  Administered 2019-01-30: 18:00:00 65 [IU] via SUBCUTANEOUS

## 2019-01-30 NOTE — Progress Notes (Signed)
[facility] PHYSICAL MEDICINE & REHABILITATION PROGRESS NOTE   Subjective/Complaints: No new issues. Sleeping well. Pain better controlled  ROS: Patient denies fever, rash, sore throat, blurred vision, nausea, vomiting, diarrhea, cough, shortness of breath or chest pain,  headache, or mood change.    Objective:   No results found. No results for input(s): WBC, HGB, HCT, PLT in the last 72 hours. No results for input(s): NA, K, CL, CO2, GLUCOSE, BUN, CREATININE, CALCIUM in the last 72 hours.  Intake/Output Summary (Last 24 hours) at 01/30/2019 0943 Last data filed at 01/30/2019 0901 Gross per 24 hour  Intake 480 ml  Output -  Net 480 ml     Physical Exam: Vital Signs Blood pressure (!) 176/79, pulse 73, temperature 99.3 F (37.4 C), temperature source Oral, resp. rate 17, height 5\' 3"  (1.6 m), weight 93.2 kg, SpO2 90 %. Constitutional: No distress . Vital signs reviewed. HEENT: EOMI, oral membranes moist Neck: supple Cardiovascular: RRR without murmur. No JVD    Respiratory: CTA Bilaterally without wheezes or rales. Normal effort    GI: BS +, non-tender, non-distended   Musculoskeletal:  Comments: Right BK remains tender with manipulation today Neurological: She is oriented to person, place, and time. Nocranial nerve deficit.Coordinationnormal. Reasonable insight and awareness  Skin: Skin iswarm. AKA well-healed. Right BK CDI with staples. Minimal s/s drainage Psychiatric: cooperative   Assessment/Plan: 1. Functional deficits secondary to right BKA which require 3+ hours per day of interdisciplinary therapy in a comprehensive inpatient rehab setting.  Physiatrist is providing close team supervision and 24 hour management of active medical problems listed below.  Physiatrist and rehab team continue to assess barriers to discharge/monitor patient progress toward functional and medical goals  Care Tool:  Bathing    Body parts bathed by patient: Right arm, Left  arm, Chest, Abdomen, Front perineal area, Buttocks, Right upper leg, Left upper leg, Face     Body parts n/a: Right lower leg, Left lower leg   Bathing assist Assist Level: Minimal Assistance - Patient > 75%     Upper Body Dressing/Undressing Upper body dressing   What is the patient wearing?: Pull over shirt    Upper body assist Assist Level: Supervision/Verbal cueing    Lower Body Dressing/Undressing Lower body dressing      What is the patient wearing?: Pants, Ace wrap/stump shrinker     Lower body assist Assist for lower body dressing: Minimal Assistance - Patient > 75%     Toileting Toileting    Toileting assist Assist for toileting: Maximal Assistance - Patient 25 - 49%     Transfers Chair/bed transfer  Transfers assist     Chair/bed transfer assist level: Moderate Assistance - Patient 50 - 74%     Locomotion Ambulation   Ambulation assist   Ambulation activity did not occur: N/A(bilat amputee)          Walk 10 feet activity   Assist  Walk 10 feet activity did not occur: N/A        Walk 50 feet activity   Assist Walk 50 feet with 2 turns activity did not occur: N/A         Walk 150 feet activity   Assist Walk 150 feet activity did not occur: N/A         Walk 10 feet on uneven surface  activity   Assist Walk 10 feet on uneven surfaces activity did not occur: N/A         Wheelchair  Assist Will patient use wheelchair at discharge?: Yes Type of Wheelchair: Manual    Wheelchair assist level: Supervision/Verbal cueing Max wheelchair distance: 150'    Wheelchair 50 feet with 2 turns activity    Assist        Assist Level: Supervision/Verbal cueing   Wheelchair 150 feet activity     Assist Wheelchair 150 feet activity did not occur: Safety/medical concerns   Assist Level: Supervision/Verbal cueing    Medical Problem List and Plan: 1.Decreased functional mobilitysecondary to right BKA  01/19/2019 as well as history of left AKA 2018 -Continue CIR therapies including PT, OT    2. Antithrombotics: -DVT/anticoagulation:Subcutaneous heparin -antiplatelet therapy: aspirin 81 mg daily    3. Pain Management:Elavil 25 mg daily at bedtime,Neurontin 300 mg daily at bedtime,oxycodone as needed -continue massage and desensitization for R BKA  -continue edema control with ACE  -gradually improving 4. Mood:trazodone 50 mg daily at bedtime, Zoloft 100 mg daily, Ativan 0.5 mg twice a day as needed -antipsychotic agents: N/A 5. Neuropsych: This patientiscapable of making decisions on herown behalf. 6. Skin/Wound Care:Routine skin checks  -ACE wrap 4", telfa---change daily  -consider shrinker next week 7. Fluids/Electrolytes/Nutrition: encourage po 8. CAD. Continue aspirin therapy. No chest pain 9. Diabetes mellitus with peripheral neuropathy. Hemoglobin A1c 8.5.  -cbg's with improvement, still higher in AM typically -NovoLog 70/30 70 units at breakfast 60 units at supper   -increase to 65u at dinner -Glucophage XR 1000mg  daily    10. Hypertension. Cozaar 100 mg daily. Monitor with increased mobility 11. Tobacco abuse. Provide counseling 12. Pravachol 40 mg daily 13. Constipation. Laxative assistance    LOS: 7 days A FACE TO FACE EVALUATION WAS PERFORMED  Ranelle Oyster 01/30/2019, 9:43 AM

## 2019-01-30 NOTE — Progress Notes (Signed)
Occupational Therapy Session Note  Patient Details  Name: Danielle Buchanan MRN: [id number] Date of Birth: [birth year]  Today's Date: 01/30/2019 OT Individual Time: 1015-1130 OT Individual Time Calculation (min): 75 min    Short Term Goals: Week 2:  OT Short Term Goal 1 (Week 2): STG=LTG due to LOS  Skilled Therapeutic Interventions/Progress Updates:    Patient in bed and ready for therapy session.  She is pleasant and cooperative t/o session.  LB washing and dressing completed with min/mod a.  Supine to SSP with min A and use of bed rails.  SB transfer to/from w/c, bed and mat table with min/mod A and cues for weight shift/body position with increased time.  Patient able to propel w/c to and from therapy gym, completed trunk and UB conditioning exercises in unsupported sitting on edge of mat with good tolerance and sitting balance.  W/c push ups with fair tolerance due to hand discomfort/weakness.  Patient able to propel w/c in room to organize clothing and supplies.  She remained in the w/c next to the bed with tray table and call bell in reach.    Therapy Documentation Precautions:  Precautions Precautions: Fall Precaution Comments: now bilateral amputee with change to CoG causing increased posterior lean Restrictions Weight Bearing Restrictions: Yes RLE Weight Bearing: Non weight bearing LLE Weight Bearing: Non weight bearing General:   Vital Signs:  Pain: Pain Assessment Pain Scale: 0-10 Pain Score: 7  Pain Type: Acute pain;Surgical pain Pain Location: Leg Pain Orientation: Right Pain Descriptors / Indicators: Aching Pain Frequency: Constant Pain Onset: On-going Pain Intervention(s): Repositioned;Elevated extremity   Other Treatments:     Therapy/Group: Individual Therapy  Barrie Lyme 01/30/2019, 11:51 AM

## 2019-01-30 NOTE — Plan of Care (Signed)
  Problem: Consults Goal: RH LIMB LOSS PATIENT EDUCATION Description Description: See Patient Education module for eduction specifics. Outcome: Progressing Goal: Skin Care Protocol Initiated - if Braden Score 18 or less Description If consults are not indicated, leave blank or document N/A Outcome: Progressing Goal: Diabetes Guidelines if Diabetic/Glucose > 140 Description If diabetic or lab glucose is > 140 mg/dl - Initiate Diabetes/Hyperglycemia Guidelines & Document Interventions  Outcome: Progressing   Problem: RH BOWEL ELIMINATION Goal: RH STG MANAGE BOWEL W/MEDICATION W/ASSISTANCE Description STG Manage Bowel with Medication with mod I Assistance.  Outcome: Progressing   Problem: RH BLADDER ELIMINATION Goal: RH STG MANAGE BLADDER WITH ASSISTANCE Description STG Manage Bladder With mod I Assistance  Outcome: Progressing   Problem: RH SKIN INTEGRITY Goal: RH STG ABLE TO PERFORM INCISION/WOUND CARE W/ASSISTANCE Description STG Able To Perform Incision/Wound Care With mod I Assistance.  Outcome: Progressing   Problem: RH SAFETY Goal: RH STG ADHERE TO SAFETY PRECAUTIONS W/ASSISTANCE/DEVICE Description STG Adhere to Safety Precautions With supervision Assistance/Device.  Outcome: Progressing   Problem: RH PAIN MANAGEMENT Goal: RH STG PAIN MANAGED AT OR BELOW PT'S PAIN GOAL Description < 5  Outcome: Progressing   

## 2019-01-30 NOTE — Progress Notes (Signed)
Occupational Therapy Weekly Progress Note  Patient Details  Name: Danielle Buchanan MRN: [id number] Date of Birth: 07-Aug-1956  Beginning of progress report period: January 31, 2019 End of progress report period: January 30, 2019  Today's Date: 01/30/2019 OT Individual Time: 1400-1500 OT Individual Time Calculation (min): 60 min    Patient has met 2 of 4 short term goals.  Pt making slow but steady progress towards OT goals. She cont to be most limited by R LE surgical pain. She is inconsistent in performance of sliding board transfers, supervision-mod A depending on incline of transfer, fatigue and pain. She requires max cuing for head/hip relationship with poor carryover over. Completing ADLs at bed level at this time, however, pt reports wanting to complete in w/c/ power scooter as she plans to do at d/c. Cont to provide education regarding increased independence and ease of task of ADLs from bed level using hospital bed functions, cont to problem solve. Pt currently requires mod-max A overall from toileting on drop arm BSC.    Patient continues to demonstrate the following deficits: muscle weakness, decreased cardiorespiratoy endurance and decreased sitting balance, decreased postural control and decreased balance strategies and therefore will continue to benefit from skilled OT intervention to enhance overall performance with BADL and Reduce care partner burden.  Patient not progressing toward long term goals.  See goal revision..  Plan of care revisions: Goals downgraded to supervision-min A overall .  OT Short Term Goals Week 1:  OT Short Term Goal 1 (Week 1): Pt will completes transfer to DAC/toilet wiht LRAD and min A OT Short Term Goal 1 - Progress (Week 1): Progressing toward goal OT Short Term Goal 2 (Week 1): Pt will complete shirt donning with supervision OT Short Term Goal 2 - Progress (Week 1): Met OT Short Term Goal 3 (Week 1): Pt will complete lateral leans wiht supervision during  LB dressing OT Short Term Goal 3 - Progress (Week 1): Progressing toward goal OT Short Term Goal 4 (Week 1): Pt will sit EOM during dynamic sitting balance task for 10 min wiht supervision OT Short Term Goal 4 - Progress (Week 1): Met Week 2:  OT Short Term Goal 1 (Week 2): STG=LTG due to LOS  Skilled Therapeutic Interventions/Progress Updates:    Pt seen for OT session focusing on functional transfers and ADL re-training. Pt in supine upon arrival, reports having recieved pain meds prior to tx session and pain in R LE trending downwards.  Session focusing on [facility] transfers and toileting task. She completed sliding board transfer to bariatric drop arm BSC. CGA overall with assist to place and steady equipment with VCs for head/hip relationship with increased time and rest breaks required. She completed lateral leans for clothing management, supervision to pull pants down and mod A to advance pants completely over hips once finished. Sliding board transfer back to bed in same manner as dscribed above. Trialed A/P transfer to [facility] from EOB. Completed at overall min A level with assist to advance hips completely during weightshift. Close supervision posterior transfer to w/c with VCs for technique. Education provided regarding pros/cons of sliding board vs. A/P transfer. Will benefit from cont practice with both methods. Completed w/c mobility within unit with supervision for UE strengthening and general activiety tolerance. Pt returned to room at end of session, left seated in w/c with all needs in reach.  Education and encouragement provided during session regarding benefits of OOB tolerance, pt requires lots of encouragement to stay up in  w/c throughout the day. Reports sitting in recliner all day at home PTA.  Pt also noted to have lots of snacks, cookies, and sugared drinks in room. RN made aware of pt diabetic.   Therapy Documentation Precautions:  Precautions Precautions: Fall Precaution  Comments: now bilateral amputee with change to CoG causing increased posterior lean Restrictions Weight Bearing Restrictions: Yes RLE Weight Bearing: Non weight bearing LLE Weight Bearing: Non weight bearing      Therapy/Group: Individual Therapy  Silena Wyss L 01/30/2019, 6:50 AM

## 2019-01-30 NOTE — Progress Notes (Signed)
Physical Therapy Session Note  Patient Details  Name: Danielle Buchanan MRN: [id number] Date of Birth: Aug 02, 1956  Today's Date: 01/30/2019 PT Individual Time: [phone] PT Individual Time Calculation (min): 42 min   Short Term Goals: Week 1:  PT Short Term Goal 1 (Week 1): pt will perform functional transfers with min A PT Short Term Goal 2 (Week 1): pt will demo dynamic sitting balance for therapeutic activities with supervision  Skilled Therapeutic Interventions/Progress Updates:   Pt received supine in bed, on bed pan requesting additional time for toileting. Pt performed rolling R/L using bed rails with mod assist for full hip movement in order to perform bed pan removal and peri-care with total assist as well as LB clothing management with max assist. Pt performed supine to sit, HOB partially elevated and using bedrails, with mod assist for trunk upright. Pt performed L lateral scoot transfer EOB to w/c using transfer board with supervision for board placement and min assist for scooting hips - cuing throughout for head/hips relationship and hand placement during transfer. Education provided on how to lock/unlock R LE leg rest. Pt performed B UE w/c mobility ~175ft to gym with supervision and significantly decreased speed of propulsion. Pt educated on importance of maintaining shoulder integrity for w/c propulsion and transfers.   Pt performed the following seated B UE exercises against level 2 theraband resistance 3 sets to fatigue: - external rotation pulsing - scapular retractions Visual demonstration provided and verbal/visual cuing throughout for proper technique.   Pt performed BUE w/c propulsion ~37ft with supervision then pt transported remainder of distance to room for energy conservation. Pt able to set-up lateral scoot transfer w/c to EOB with min cuing. Pt performed R lateral scoot transfer using transfer board with supervision for board placement and mod assist for hip scooting  due to bed slightly higher than w/c. Performed sit to supine with close supervision and using bedrails. Pt left supine in bed with needs in reach and bed alarm on.   Therapy Documentation Precautions:  Precautions Precautions: Fall Precaution Comments: now bilateral amputee with change to CoG causing increased posterior lean Restrictions Weight Bearing Restrictions: Yes RLE Weight Bearing: Non weight bearing LLE Weight Bearing: Non weight bearing  Pain: Reported 7/10 pain in R LE and requested to limit transfers during session due to pain medication not being available for a few more hours.    Therapy/Group: Individual Therapy  Ginny Forth, PT, DPT 01/30/2019, 3:49 PM

## 2019-01-31 DIAGNOSIS — E669 Obesity, unspecified: Secondary | ICD-10-CM

## 2019-01-31 DIAGNOSIS — E1169 Type 2 diabetes mellitus with other specified complication: Secondary | ICD-10-CM

## 2019-01-31 LAB — GLUCOSE, CAPILLARY
Glucose-Capillary: 234 mg/dL — ABNORMAL HIGH (ref 70–99)
Glucose-Capillary: 174 mg/dL — ABNORMAL HIGH (ref 70–99)
Glucose-Capillary: 156 mg/dL — ABNORMAL HIGH (ref 70–99)
Glucose-Capillary: 140 mg/dL — ABNORMAL HIGH (ref 70–99)

## 2019-01-31 MED ORDER — OXYCODONE HCL ER 10 MG PO T12A
10.0000 mg | EXTENDED_RELEASE_TABLET | Freq: Every day | ORAL | Status: DC
  Administered 2019-01-31 – 2019-02-05 (×6): 10 mg via ORAL
  Filled 2019-01-31 (×6): qty 1

## 2019-01-31 MED ORDER — INSULIN ASPART PROT & ASPART (70-30 MIX) 100 UNIT/ML ~~LOC~~ SUSP
70.0000 [IU] | Freq: Every day | SUBCUTANEOUS | Status: DC
  Administered 2019-01-31: 17:00:00 70 [IU] via SUBCUTANEOUS

## 2019-01-31 NOTE — Progress Notes (Signed)
[place] PHYSICAL MEDICINE & REHABILITATION PROGRESS NOTE   Subjective/Complaints:  Increased Right stump pain last noc   ROS: Patient denies CP, SOB, N/V?D Objective:   No results found. No results for input(s): WBC, HGB, HCT, PLT in the last 72 hours. No results for input(s): NA, K, CL, CO2, GLUCOSE, BUN, CREATININE, CALCIUM in the last 72 hours.  Intake/Output Summary (Last 24 hours) at 01/31/2019 0904 Last data filed at 01/30/2019 1819 Gross per 24 hour  Intake 600 ml  Output -  Net 600 ml     Physical Exam: Vital Signs Blood pressure 134/75, pulse 60, temperature 97.7 F (36.5 C), temperature source Oral, resp. rate 12, height 5\' 3"  (1.6 m), weight 93.2 kg, SpO2 94 %. Constitutional: No distress . Vital signs reviewed. HEENT: EOMI, oral membranes moist Neck: supple Cardiovascular: RRR without murmur. No JVD    Respiratory: CTA Bilaterally without wheezes or rales. Normal effort    GI: BS +, non-tender, non-distended   Musculoskeletal:  Comments: Right BK remains tender with manipulation today Neurological: She is oriented to person, place, and time. Nocranial nerve deficit.Coordinationnormal. Reasonable insight and awareness  Skin: Skin iswarm. AKA well-healed. Right BK CDI with staples. Minimal s/s drainage Psychiatric: cooperative   Assessment/Plan: 1. Functional deficits secondary to right BKA which require 3+ hours per day of interdisciplinary therapy in a comprehensive inpatient rehab setting.  Physiatrist is providing close team supervision and 24 hour management of active medical problems listed below.  Physiatrist and rehab team continue to assess barriers to discharge/monitor patient progress toward functional and medical goals  Care Tool:  Bathing    Body parts bathed by patient: Right arm, Left arm, Chest, Abdomen, Front perineal area, Buttocks, Right upper leg, Left upper leg, Face     Body parts n/a: Right lower leg, Left lower leg    Bathing assist Assist Level: Minimal Assistance - Patient > 75%     Upper Body Dressing/Undressing Upper body dressing   What is the patient wearing?: Pull over shirt    Upper body assist Assist Level: Supervision/Verbal cueing    Lower Body Dressing/Undressing Lower body dressing      What is the patient wearing?: Pants, Incontinence brief     Lower body assist Assist for lower body dressing: Moderate Assistance - Patient 50 - 74%     Toileting Toileting    Toileting assist Assist for toileting: Moderate Assistance - Patient 50 - 74%     Transfers Chair/bed transfer  Transfers assist     Chair/bed transfer assist level: Moderate Assistance - Patient 50 - 74%(slide board)     Locomotion Ambulation   Ambulation assist   Ambulation activity did not occur: N/A(bilat amputee)          Walk 10 feet activity   Assist  Walk 10 feet activity did not occur: N/A        Walk 50 feet activity   Assist Walk 50 feet with 2 turns activity did not occur: N/A         Walk 150 feet activity   Assist Walk 150 feet activity did not occur: N/A         Walk 10 feet on uneven surface  activity   Assist Walk 10 feet on uneven surfaces activity did not occur: N/A         Wheelchair     Assist Will patient use wheelchair at discharge?: Yes Type of Wheelchair: Manual    Wheelchair assist level: Supervision/Verbal cueing  Max wheelchair distance: 158ft    Wheelchair 50 feet with 2 turns activity    Assist        Assist Level: Supervision/Verbal cueing   Wheelchair 150 feet activity     Assist Wheelchair 150 feet activity did not occur: Safety/medical concerns   Assist Level: Supervision/Verbal cueing    Medical Problem List and Plan: 1.Decreased functional mobilitysecondary to right BKA 01/19/2019 as well as history of left AKA 2018 -Continue CIR therapies including PT, OT    2.  Antithrombotics: -DVT/anticoagulation:Subcutaneous heparin -antiplatelet therapy: aspirin 81 mg daily    3. Pain Management:Elavil 25 mg daily at bedtime,Neurontin 300 mg daily at bedtime,oxycodone as needed Add oxyCR 10mg  qhs -continue massage and desensitization for R BKA  -continue edema control with ACE  -gradually improving 4. Mood:trazodone 50 mg daily at bedtime, Zoloft 100 mg daily, Ativan 0.5 mg twice a day as needed -antipsychotic agents: N/A 5. Neuropsych: This patientiscapable of making decisions on herown behalf. 6. Skin/Wound Care:Routine skin checks  -ACE wrap 4", telfa---change daily  -consider shrinker next week 7. Fluids/Electrolytes/Nutrition: encourage po 8. CAD. Continue aspirin therapy. No chest pain 9. Diabetes mellitus with peripheral neuropathy. Hemoglobin A1c 8.5.  -cbg's with improvement, still higher in AM typically -NovoLog 70/30 70 units at breakfast 60 units at supper   -increase to 65u at dinner -Glucophage XR 1000mg  daily    CBG (last 3)  Recent Labs    01/30/19 1702 01/30/19 2120 01/31/19 0636  GLUCAP 230* 130* 234*  goal is <180, will increase pm 70/30 to 70U 10. Hypertension. Cozaar 100 mg daily. Monitor with increased mobility Vitals:   01/30/19 1940 01/31/19 0447  BP: (!) 155/77 134/75  Pulse: 77 60  Resp: 12 12  Temp: 98.4 F (36.9 C) 97.7 F (36.5 C)  SpO2: 96% 94%  some lability, no med change monitor 11. Tobacco abuse. Provide counseling 12. Pravachol 40 mg daily 13. Constipation. Laxative assistance    LOS: 8 days A FACE TO FACE EVALUATION WAS PERFORMED  Erick Colace 01/31/2019, 9:04 AM

## 2019-01-31 NOTE — Progress Notes (Signed)
[facility]   Subjective  - Some pain in BKA stump.   Objective 134/75 60 97.7 F (36.5 C) (Oral) 12 94%  Intake/Output Summary (Last 24 hours) at 01/31/2019 1015 Last data filed at 01/31/2019 0900 Gross per 24 hour  Intake 720 ml  Output -  Net 720 ml    Dressing c/d/i to R BKA  Laboratory Lab Results: No results for input(s): WBC, HGB, HCT, PLT in the last 72 hours. BMET No results for input(s): NA, K, CL, CO2, GLUCOSE, BUN, CREATININE, CALCIUM in the last 72 hours.  COAG Lab Results  Component Value Date   INR 1.08 11/19/2018   INR 1.07 11/12/2018   INR 1.12 08/30/2017   No results found for: PTT  Assessment/Planning:  R BKA dressing changed by Dr. Wynn Banker with PM&R this am.  No immediate concerns.  Vascular surgery will be available as needed.  Call with questions or concerns.  Discussed staples typically stay for 3 weeks.  Aprreciate CIR help.  Cephus Shelling 01/31/2019 10:15 AM --

## 2019-02-01 ENCOUNTER — Inpatient Hospital Stay (HOSPITAL_COMMUNITY): Payer: Medicare HMO

## 2019-02-01 LAB — GLUCOSE, CAPILLARY
Glucose-Capillary: 196 mg/dL — ABNORMAL HIGH (ref 70–99)
Glucose-Capillary: 186 mg/dL — ABNORMAL HIGH (ref 70–99)
Glucose-Capillary: 208 mg/dL — ABNORMAL HIGH (ref 70–99)
Glucose-Capillary: 149 mg/dL — ABNORMAL HIGH (ref 70–99)

## 2019-02-01 MED ORDER — AMITRIPTYLINE HCL 10 MG PO TABS
10.0000 mg | ORAL_TABLET | Freq: Every day | ORAL | Status: DC
  Administered 2019-02-01: 22:00:00 10 mg via ORAL
  Filled 2019-02-01: qty 1

## 2019-02-01 MED ORDER — INSULIN ASPART PROT & ASPART (70-30 MIX) 100 UNIT/ML ~~LOC~~ SUSP
75.0000 [IU] | Freq: Every day | SUBCUTANEOUS | Status: DC
  Administered 2019-02-01 – 2019-02-05 (×5): 75 [IU] via SUBCUTANEOUS

## 2019-02-01 NOTE — Progress Notes (Signed)
[place] PHYSICAL MEDICINE & REHABILITATION PROGRESS NOTE   Subjective/Complaints:  No issues overnite   ROS: Patient denies CP, SOB, N/V?D Objective:   No results found. No results for input(s): WBC, HGB, HCT, PLT in the last 72 hours. No results for input(s): NA, K, CL, CO2, GLUCOSE, BUN, CREATININE, CALCIUM in the last 72 hours.  Intake/Output Summary (Last 24 hours) at 02/01/2019 0839 Last data filed at 01/31/2019 1855 Gross per 24 hour  Intake 720 ml  Output -  Net 720 ml     Physical Exam: Vital Signs Blood pressure 130/74, pulse 65, temperature 97.8 F (36.6 C), temperature source Oral, resp. rate 14, height 5\' 3"  (1.6 m), weight 93.2 kg, SpO2 94 %. Constitutional: No distress . Vital signs reviewed. HEENT: EOMI, oral membranes moist Neck: supple Cardiovascular: RRR without murmur. No JVD    Respiratory: CTA Bilaterally without wheezes or rales. Normal effort    GI: BS +, non-tender, non-distended   Musculoskeletal:  Comments: Right BK remains tender with manipulation today Neurological: She is oriented to person, place, and time. Nocranial nerve deficit.Coordinationnormal. Reasonable insight and awareness  Skin: Skin iswarm. AKA well-healed. Right BK CDI with staples. Minimal s/s drainage Psychiatric: cooperative   Assessment/Plan: 1. Functional deficits secondary to right BKA which require 3+ hours per day of interdisciplinary therapy in a comprehensive inpatient rehab setting.  Physiatrist is providing close team supervision and 24 hour management of active medical problems listed below.  Physiatrist and rehab team continue to assess barriers to discharge/monitor patient progress toward functional and medical goals  Care Tool:  Bathing    Body parts bathed by patient: Right arm, Left arm, Chest, Abdomen, Front perineal area, Buttocks, Right upper leg, Left upper leg, Face     Body parts n/a: Right lower leg, Left lower leg   Bathing assist  Assist Level: Minimal Assistance - Patient > 75%     Upper Body Dressing/Undressing Upper body dressing   What is the patient wearing?: Pull over shirt    Upper body assist Assist Level: Supervision/Verbal cueing    Lower Body Dressing/Undressing Lower body dressing      What is the patient wearing?: Pants, Incontinence brief     Lower body assist Assist for lower body dressing: Moderate Assistance - Patient 50 - 74%     Toileting Toileting    Toileting assist Assist for toileting: Moderate Assistance - Patient 50 - 74%     Transfers Chair/bed transfer  Transfers assist     Chair/bed transfer assist level: Moderate Assistance - Patient 50 - 74%(slide board)     Locomotion Ambulation   Ambulation assist   Ambulation activity did not occur: N/A(bilat amputee)          Walk 10 feet activity   Assist  Walk 10 feet activity did not occur: N/A        Walk 50 feet activity   Assist Walk 50 feet with 2 turns activity did not occur: N/A         Walk 150 feet activity   Assist Walk 150 feet activity did not occur: N/A         Walk 10 feet on uneven surface  activity   Assist Walk 10 feet on uneven surfaces activity did not occur: N/A         Wheelchair     Assist Will patient use wheelchair at discharge?: Yes Type of Wheelchair: Manual    Wheelchair assist level: Supervision/Verbal cueing Max wheelchair distance:  1450ft    Wheelchair 50 feet with 2 turns activity    Assist        Assist Level: Supervision/Verbal cueing   Wheelchair 150 feet activity     Assist Wheelchair 150 feet activity did not occur: Safety/medical concerns   Assist Level: Supervision/Verbal cueing    Medical Problem List and Plan: 1.Decreased functional mobilitysecondary to right BKA 01/19/2019 as well as history of left AKA 2018 -Continue CIR therapies including PT, OT    2.  Antithrombotics: -DVT/anticoagulation:Subcutaneous heparin -antiplatelet therapy: aspirin 81 mg daily    3. Pain Management:Elavil 25 mg daily at bedtime,Neurontin 300 mg daily at bedtime,oxycodone as needed Added oxyCR 10mg  qhs slept much better but a bit groggy today, will reduce elevil to 10mg  -continue massage and desensitization for R BKA  -continue edema control with ACE  -gradually improving 4. Mood:trazodone 50 mg daily at bedtime, Zoloft 100 mg daily, Ativan 0.5 mg twice a day as needed -antipsychotic agents: N/A 5. Neuropsych: This patientiscapable of making decisions on herown behalf. 6. Skin/Wound Care:Routine skin checks  -ACE wrap 4", telfa---change daily  -consider shrinker next week 7. Fluids/Electrolytes/Nutrition: encourage po 8. CAD. Continue aspirin therapy. No chest pain 9. Diabetes mellitus with peripheral neuropathy. Hemoglobin A1c 8.5.  -cbg's with improvement, still higher in AM typically -NovoLog 70/30 70 units at breakfast 60 units at supper   -increase to 65u at dinner -Glucophage XR 1000mg  daily    CBG (last 3)  Recent Labs    01/31/19 1632 01/31/19 2120 02/01/19 0640  GLUCAP 156* 140* 196*  goal is <180, will increase pm 70/30 to 75U 10. Hypertension. Cozaar 100 mg daily. Monitor with increased mobility Vitals:   01/31/19 2105 02/01/19 0548  BP: (!) 151/68 130/74  Pulse: 71 65  Resp: 16 14  Temp: 98.1 F (36.7 C) 97.8 F (36.6 C)  SpO2: 96% 94%  some lability, no med change monitor 11. Tobacco abuse. Provide counseling 12. Pravachol 40 mg daily 13. Constipation. Laxative assistance    LOS: 9 days A FACE TO FACE EVALUATION WAS PERFORMED  Erick Colacendrew E Oriyah Lamphear 02/01/2019, 8:39 AM

## 2019-02-01 NOTE — Progress Notes (Signed)
Occupational Therapy Session Note  Patient Details  Name: LATIKA KRONICK MRN: [id number] Date of Birth: November 14, 1955  Today's Date: 02/01/2019 OT Individual Time: 1600-1630 OT Individual Time Calculation (min): 30 min    Short Term Goals: Week 2:  OT Short Term Goal 1 (Week 2): STG=LTG due to LOS  Skilled Therapeutic Interventions/Progress Updates:    Session focused on forward weight shift and functional reaching. Pt met OT in gym, ready and eager for session! Pt completed slideboard transfer to mat with slight uphill, requiring mod A and continued cueing for UE placement. Pt completed functional reaching, alternating between unilateral and bilateral reach. Pt provided with tactile cue anteriorly to improve pt confidence. Pt had several slight LOB with exaggerated protective response. Pt returned to w/c and transferred back to bed at end of session with min A. Pt left supine with all needs met, bed alarm set.   Therapy Documentation Precautions:  Precautions Precautions: Fall Precaution Comments: now bilateral amputee with change to CoG causing increased posterior lean Restrictions Weight Bearing Restrictions: Yes RLE Weight Bearing: Non weight bearing LLE Weight Bearing: Non weight bearing   Therapy/Group: Individual Therapy  Curtis Sites 02/01/2019, 4:57 PM

## 2019-02-02 ENCOUNTER — Inpatient Hospital Stay (HOSPITAL_COMMUNITY): Payer: Medicare HMO | Admitting: Physical Therapy

## 2019-02-02 ENCOUNTER — Inpatient Hospital Stay (HOSPITAL_COMMUNITY): Payer: Medicare HMO | Admitting: Occupational Therapy

## 2019-02-02 LAB — GLUCOSE, CAPILLARY
Glucose-Capillary: 158 mg/dL — ABNORMAL HIGH (ref 70–99)
Glucose-Capillary: 128 mg/dL — ABNORMAL HIGH (ref 70–99)
Glucose-Capillary: 191 mg/dL — ABNORMAL HIGH (ref 70–99)
Glucose-Capillary: 165 mg/dL — ABNORMAL HIGH (ref 70–99)

## 2019-02-02 NOTE — Progress Notes (Signed)
Physical Therapy Weekly Progress Note  Patient Details  Name: Danielle Buchanan MRN: [id number] Date of Birth: 1955/11/21  Beginning of progress report period: January 24, 2019 End of progress report period: February 02, 2019  Today's Date: 02/02/2019 PT Individual Time: 2778-2423; 1030-1130 PT Individual Time Calculation (min): 75 min and 60 min  Patient has met 2 of 2 short term goals.  Pt is requiring anywhere from close SBA to min A for slide board transfers at this time. Pt usually requires close SBA and setup A for level surface transfers with max verbal cueing for head/hips relationship and weight shift during transfer as well as requires significantly increased time to complete the transfer. When transferring to/from uneven surfaces pt requires up to min A. Pt is at Supervision level for w/c mobility.  Patient continues to demonstrate the following deficits muscle weakness and decreased sitting balance, decreased postural control and decreased balance strategies and therefore will continue to benefit from skilled PT intervention to increase functional independence with mobility.  Patient progressing toward long term goals..  Continue plan of care.  PT Short Term Goals Week 1:  PT Short Term Goal 1 (Week 1): pt will perform functional transfers with min A PT Short Term Goal 1 - Progress (Week 1): Met PT Short Term Goal 2 (Week 1): pt will demo dynamic sitting balance for therapeutic activities with supervision PT Short Term Goal 2 - Progress (Week 1): Met Week 2:  PT Short Term Goal 1 (Week 2): =LTG due to ELOS  Skilled Therapeutic Interventions/Progress Updates:   Session 1: Pt received seated in bed, agreeable to PT session. Pt reports 4/10 pain in R residual limb. Pt reports incontinence of urine in her brief. Rolling L/R with use of bedrails for dependent brief change and setup A for pericare. Dependent to don new brief, setup A to don pants in supine. Supine to sit with min A for trunk  control with use of bedrails. Pt is setup A to change shirt while seated EOB. Slide board transfer bed to w/c with min A and increased time and cues needed to complete transfer. Pt eats breakfast while seated in w/c. Discussion with pt about d/c plan, home setup, etc while she eats breakfast. Manual w/c propulsion x 150 ft with use of BUE and Supervision, increased time needed to complete. A/P transfer w/c to level mat table with CGA for sitting balance, significantly increased time and cueing needed to perform transfer. A/P transfer mat table to w/c with close SBA. Slide board transfer w/c to 23" mat table to simulate patient's bed height at home, min A due to going uphill. Slide board transfer mat table to w/c with close SBA. Discussion with patient about therapist recommendation for hospital bed due to ongoing patient reliance on bedrails as well as pt is more independent with level surface transfers vs going uphill and she would benefit from an adjustable height bed, pt agreeable. Notified CSW who is able to provide information about hospital bed to patient and her family. Pt left seated in w/c in room with needs in reach at end of session.  Session 2: Pt received seated in w/c in room, agreeable to PT session. Pt reports 4/10 pain in R residual limb. Slide board transfer w/c to car with SBA going into passenger seat to the L with increased time and cues needed. Slide board transfer car to w/c with CGA for safety going to the R, 22" seat height to simulate pt's family's car. Slide  board transfer x 6 reps w/c to/from mat table at 20" height with close SBA, increased time and cues needed. Pt left seated in w/c in room with needs in reach at end of session.   Therapy Documentation Precautions:  Precautions Precautions: Fall Precaution Comments: now bilateral amputee with change to CoG causing increased posterior lean Restrictions Weight Bearing Restrictions: Yes RLE Weight Bearing: Non weight  bearing LLE Weight Bearing: Non weight bearing   Therapy/Group: Individual Therapy   Excell Seltzer, PT, DPT  02/02/2019, 12:33 PM

## 2019-02-02 NOTE — Progress Notes (Signed)
Occupational Therapy Session Note  Patient Details  Name: Danielle Buchanan MRN: [id number] Date of Birth: 26-Oct-1955  Today's Date: 02/02/2019 OT Individual Time: 1345-1500 OT Individual Time Calculation (min): 75 min    Short Term Goals: Week 2:  OT Short Term Goal 1 (Week 2): STG=LTG due to LOS  Skilled Therapeutic Interventions/Progress Updates:    Pt seen for OT session focusing on UE and core strengthening. Pt sitting upright in w/c upon arrival, no formal complaints of pain. She declined working on ADLs or toilet transfers this session, opting to work on that tomorrow and desiring to work on UE and core strengthening. She self propelled w/c throughout unit mod I for UE strengthening and general activity tolerance.  Completed sliding board transfers w/c>EOM and w/c>EOB with supervision, assist to steady equipment and significantly increased time.  Exercises as followed completed x2 sets of 10 of each exercise with demonstration and VCs for proper form and technique.   Seated EOM Using 9# dumbbell- chest press, overhead press, [place] Twists, and tricep extension  Lateral Leans R/L  In Prone: glute squeeze, hip ABduction, supermans. Rest breaks provided btwn each exercise. Pt able to transfer prone on mat to sitting EOM with supervision/VCs and increased time. Completed posterior transfer back into w/c with increased time and VCs.  Returned to room and pt opting to complete sliding board transfer back to bed. Pt left in supine at end of session with NT present to perform vitals.   Therapy Documentation Precautions:  Precautions Precautions: Fall Precaution Comments: now bilateral amputee with change to CoG causing increased posterior lean Restrictions Weight Bearing Restrictions: Yes RLE Weight Bearing: Non weight bearing LLE Weight Bearing: Non weight bearing   Therapy/Group: Individual Therapy  [name] 02/02/2019, 7:08 AM

## 2019-02-02 NOTE — Progress Notes (Signed)
[place] PHYSICAL MEDICINE & REHABILITATION PROGRESS NOTE   Subjective/Complaints:  Had a reasonable night. Having a hard time waking up. Pain control fair  ROS: Patient denies fever, rash, sore throat, blurred vision, nausea, vomiting, diarrhea, cough, shortness of breath or chest pain, joint or back pain, headache, or mood change.    Objective:   No results found. No results for input(s): WBC, HGB, HCT, PLT in the last 72 hours. No results for input(s): NA, K, CL, CO2, GLUCOSE, BUN, CREATININE, CALCIUM in the last 72 hours.  Intake/Output Summary (Last 24 hours) at 02/02/2019 8466 Last data filed at 02/02/2019 0845 Gross per 24 hour  Intake 1260 ml  Output -  Net 1260 ml     Physical Exam: Vital Signs Blood pressure (!) 158/85, pulse 64, temperature 98.1 F (36.7 C), temperature source Oral, resp. rate 16, height 5\' 3"  (1.6 m), weight 93.2 kg, SpO2 91 %. Constitutional: No distress . Vital signs reviewed. HEENT: EOMI, oral membranes moist Neck: supple Cardiovascular: RRR without murmur. No JVD    Respiratory: CTA Bilaterally without wheezes or rales. Normal effort    GI: BS +, non-tender, non-distended  Musculoskeletal:  Comments: Right BK tender but tolerated manipulation more. Moves it more on her own Neurological: She is oriented to person, place, and time. Nocranial nerve deficit.Coordinationnormal. Reasonable insight and awareness  Skin: Skin iswarm. AKA well-healed. Right BK clean with staples Psychiatric: cooperative   Assessment/Plan: 1. Functional deficits secondary to right BKA which require 3+ hours per day of interdisciplinary therapy in a comprehensive inpatient rehab setting.  Physiatrist is providing close team supervision and 24 hour management of active medical problems listed below.  Physiatrist and rehab team continue to assess barriers to discharge/monitor patient progress toward functional and medical goals  Care Tool:  Bathing     Body parts bathed by patient: Right arm, Left arm, Chest, Abdomen, Front perineal area, Buttocks, Right upper leg, Left upper leg, Face     Body parts n/a: Right lower leg, Left lower leg   Bathing assist Assist Level: Minimal Assistance - Patient > 75%     Upper Body Dressing/Undressing Upper body dressing   What is the patient wearing?: Pull over shirt    Upper body assist Assist Level: Supervision/Verbal cueing    Lower Body Dressing/Undressing Lower body dressing      What is the patient wearing?: Pants, Incontinence brief     Lower body assist Assist for lower body dressing: Moderate Assistance - Patient 50 - 74%     Toileting Toileting    Toileting assist Assist for toileting: Moderate Assistance - Patient 50 - 74%     Transfers Chair/bed transfer  Transfers assist     Chair/bed transfer assist level: Moderate Assistance - Patient 50 - 74%(slide board)     Locomotion Ambulation   Ambulation assist   Ambulation activity did not occur: N/A(bilat amputee)          Walk 10 feet activity   Assist  Walk 10 feet activity did not occur: N/A        Walk 50 feet activity   Assist Walk 50 feet with 2 turns activity did not occur: N/A         Walk 150 feet activity   Assist Walk 150 feet activity did not occur: N/A         Walk 10 feet on uneven surface  activity   Assist Walk 10 feet on uneven surfaces activity did not occur: N/A  Wheelchair     Assist Will patient use wheelchair at discharge?: Yes Type of Wheelchair: Manual    Wheelchair assist level: Supervision/Verbal cueing Max wheelchair distance: 12450ft    Wheelchair 50 feet with 2 turns activity    Assist        Assist Level: Supervision/Verbal cueing   Wheelchair 150 feet activity     Assist Wheelchair 150 feet activity did not occur: Safety/medical concerns   Assist Level: Supervision/Verbal cueing    Medical Problem List and  Plan: 1.Decreased functional mobilitysecondary to right BKA 01/19/2019 as well as history of left AKA 2018 -Continue CIR therapies including PT, OT    2. Antithrombotics: -DVT/anticoagulation:Subcutaneous heparin -antiplatelet therapy: aspirin 81 mg daily    3. Pain Management:Elavil 25 mg daily at bedtime,Neurontin 300 mg daily at bedtime,oxycodone as needed   oxyCR 10mg  qhs helping but sleepy.   -elavil reduced yesterday, will stop tonight -continue massage and desensitization for R BKA  -continue edema control with ACE  -gradually improving 4. Mood:trazodone 50 mg daily at bedtime, Zoloft 100 mg daily, Ativan 0.5 mg twice a day as needed -antipsychotic agents: N/A 5. Neuropsych: This patientiscapable of making decisions on herown behalf. 6. Skin/Wound Care:Routine skin checks  -ACE wrap 4", telfa---change daily  -will probably order shrinker tomorrow 7. Fluids/Electrolytes/Nutrition: encourage po 8. CAD. Continue aspirin therapy. No chest pain 9. Diabetes mellitus with peripheral neuropathy. Hemoglobin A1c 8.5.  -cbg's with improvement, remain higher in AM typically still -NovoLog 70/30 70 units at breakfast 60 units at supper   -increased to 75u at dinner 4/26 -Glucophage XR 1000mg  daily    CBG (last 3)  Recent Labs    02/01/19 1734 02/01/19 2100 02/02/19 0612  GLUCAP 208* 149* 158*    10. Hypertension. Cozaar 100 mg daily. Monitor with increased mobility Vitals:   02/01/19 1953 02/02/19 0534  BP: 140/67 (!) 158/85  Pulse: 75 64  Resp: 16 16  Temp: 98.3 F (36.8 C) 98.1 F (36.7 C)  SpO2: 94% 91%  fair to borderline control 11. Tobacco abuse. Provide counseling 12. Pravachol 40 mg daily 13. Constipation. Laxative assistance    LOS: 10 days A FACE TO FACE EVALUATION WAS PERFORMED  Ranelle OysterZachary T Nathania Waldman 02/02/2019, 9:22 AM

## 2019-02-03 ENCOUNTER — Inpatient Hospital Stay (HOSPITAL_COMMUNITY): Payer: Medicare HMO | Admitting: Occupational Therapy

## 2019-02-03 ENCOUNTER — Inpatient Hospital Stay (HOSPITAL_COMMUNITY): Payer: Medicare HMO | Admitting: Physical Therapy

## 2019-02-03 LAB — GLUCOSE, CAPILLARY
Glucose-Capillary: 152 mg/dL — ABNORMAL HIGH (ref 70–99)
Glucose-Capillary: 139 mg/dL — ABNORMAL HIGH (ref 70–99)
Glucose-Capillary: 136 mg/dL — ABNORMAL HIGH (ref 70–99)
Glucose-Capillary: 176 mg/dL — ABNORMAL HIGH (ref 70–99)

## 2019-02-03 LAB — CBC
HCT: 37.2 % (ref 36.0–46.0)
Hemoglobin: 12.9 g/dL (ref 12.0–15.0)
MCH: 28.6 pg (ref 26.0–34.0)
MCHC: 34.7 g/dL (ref 30.0–36.0)
MCV: 82.5 fL (ref 80.0–100.0)
Platelets: 188 10*3/uL (ref 150–400)
RBC: 4.51 MIL/uL (ref 3.87–5.11)
RDW: 14.2 % (ref 11.5–15.5)
WBC: 8.4 10*3/uL (ref 4.0–10.5)
nRBC: 0 % (ref 0.0–0.2)

## 2019-02-03 LAB — BASIC METABOLIC PANEL
Anion gap: 13 (ref 5–15)
BUN: 19 mg/dL (ref 8–23)
CO2: 23 mmol/L (ref 22–32)
Calcium: 9.2 mg/dL (ref 8.9–10.3)
Chloride: 94 mmol/L — ABNORMAL LOW (ref 98–111)
Creatinine, Ser: 0.56 mg/dL (ref 0.44–1.00)
GFR calc Af Amer: >60 mL/min (ref >60)
GFR calc non Af Amer: >60 mL/min (ref >60)
Glucose, Bld: 127 mg/dL — ABNORMAL HIGH (ref 70–99)
Potassium: 4 mmol/L (ref 3.5–5.1)
Sodium: 130 mmol/L — ABNORMAL LOW (ref 135–145)

## 2019-02-03 MED ORDER — INSULIN ASPART PROT & ASPART (70-30 MIX) 100 UNIT/ML ~~LOC~~ SUSP
75.0000 [IU] | Freq: Every day | SUBCUTANEOUS | Status: DC
  Administered 2019-02-04 – 2019-02-06 (×3): 75 [IU] via SUBCUTANEOUS
  Filled 2019-02-03: qty 10

## 2019-02-03 NOTE — Progress Notes (Signed)
  Patient ID: Danielle Buchanan, female   DOB: 1956-03-17, 63 y.o.   MRN: [id number]    Diagnosis code: Z47.81;  Z89.612; I25.10; E11.42  Height:  5'2"  Weight:  204 lbs   Patient has new right BKA, prior left AKA, CAD and neuropathy  which requires lower extremities to be positioned in ways not feasible with a normal bed.  Neuropathy and bilateral amputation of lower extremeties require frequent and immediate changes in body position which cannot be achieved with a normal bed.   Deatra Ina, PA-C

## 2019-02-03 NOTE — Progress Notes (Addendum)
[place] PHYSICAL MEDICINE & REHABILITATION PROGRESS NOTE   Subjective/Complaints:  No new issues. More awake this morning. Pain controlled. Asked when she was going to receive shrinker  ROS: Patient denies fever, rash, sore throat, blurred vision, nausea, vomiting, diarrhea, cough, shortness of breath or chest pain, joint or back pain, headache, or mood change.     Objective:   No results found. Recent Labs    02/03/19 0550  WBC 8.4  HGB 12.9  HCT 37.2  PLT 188   Recent Labs    02/03/19 0550  NA 130*  K 4.0  CL 94*  CO2 23  GLUCOSE 127*  BUN 19  CREATININE 0.56  CALCIUM 9.2    Intake/Output Summary (Last 24 hours) at 02/03/2019 0909 Last data filed at 02/03/2019 0720 Gross per 24 hour  Intake 843 ml  Output -  Net 843 ml     Physical Exam: Vital Signs Blood pressure 138/73, pulse 67, temperature 97.9 F (36.6 C), resp. rate 18, height 5\' 3"  (1.6 m), weight 93.2 kg, SpO2 97 %. Constitutional: No distress . Vital signs reviewed. HEENT: EOMI, oral membranes moist Neck: supple Cardiovascular: RRR without murmur. No JVD    Respiratory: CTA Bilaterally without wheezes or rales. Normal effort    GI: BS +, non-tender, non-distended  Musculoskeletal:  Comments: right BK less tender Neurological: alert and  oriented to person, place, and time. Nocranial nerve deficit.Coordinationnormal. Reasonable insight and awareness  Skin: Skin iswarm. AKA well-healed. Right BK clean with staples intact Psychiatric: cooperative   Assessment/Plan: 1. Functional deficits secondary to right BKA which require 3+ hours per day of interdisciplinary therapy in a comprehensive inpatient rehab setting.  Physiatrist is providing close team supervision and 24 hour management of active medical problems listed below.  Physiatrist and rehab team continue to assess barriers to discharge/monitor patient progress toward functional and medical goals  Care Tool:  Bathing     Body parts bathed by patient: Right arm, Left arm, Chest, Abdomen, Front perineal area, Buttocks, Right upper leg, Left upper leg, Face     Body parts n/a: Right lower leg, Left lower leg   Bathing assist Assist Level: Minimal Assistance - Patient > 75%     Upper Body Dressing/Undressing Upper body dressing   What is the patient wearing?: Pull over shirt    Upper body assist Assist Level: Supervision/Verbal cueing    Lower Body Dressing/Undressing Lower body dressing      What is the patient wearing?: Pants, Incontinence brief     Lower body assist Assist for lower body dressing: Moderate Assistance - Patient 50 - 74%     Toileting Toileting    Toileting assist Assist for toileting: Moderate Assistance - Patient 50 - 74%     Transfers Chair/bed transfer  Transfers assist     Chair/bed transfer assist level: Minimal Assistance - Patient > 75%     Locomotion Ambulation   Ambulation assist   Ambulation activity did not occur: N/A(bilat amputee)          Walk 10 feet activity   Assist  Walk 10 feet activity did not occur: N/A        Walk 50 feet activity   Assist Walk 50 feet with 2 turns activity did not occur: N/A         Walk 150 feet activity   Assist Walk 150 feet activity did not occur: N/A         Walk 10 feet on uneven surface  activity   Assist Walk 10 feet on uneven surfaces activity did not occur: N/A         Wheelchair     Assist Will patient use wheelchair at discharge?: Yes Type of Wheelchair: Manual    Wheelchair assist level: Independent Max wheelchair distance: 176ft    Wheelchair 50 feet with 2 turns activity    Assist        Assist Level: Independent   Wheelchair 150 feet activity     Assist Wheelchair 150 feet activity did not occur: Safety/medical concerns   Assist Level: Independent    Medical Problem List and Plan: 1.Decreased functional mobilitysecondary to right BKA  01/19/2019 as well as history of left AKA 2018 -Continue CIR therapies including PT, OT  -will ask [employer] to bring shrinker sock today 2. Antithrombotics: -DVT/anticoagulation:Subcutaneous heparin -antiplatelet therapy: aspirin 81 mg daily    3. Pain Management:Elavil 25 mg daily at bedtime,Neurontin 300 mg daily at bedtime,oxycodone as needed   oxyCR 10mg  qhs helping     -elavil at night stopped---pt more alert today -continue massage and desensitization for R BKA  -continue edema control with ACE  -gradually improving 4. Mood:trazodone 50 mg daily at bedtime, Zoloft 100 mg daily, Ativan 0.5 mg twice a day as needed -antipsychotic agents: N/A 5. Neuropsych: This patientiscapable of making decisions on herown behalf. 6. Skin/Wound Care:Routine skin checks  -ACE wrap 4", telfa---change daily  -shrinker 7. Fluids/Electrolytes/Nutrition: encourage po 8. CAD. Continue aspirin therapy. No chest pain 9. Diabetes mellitus with peripheral neuropathy. Hemoglobin A1c 8.5.  -cbg's with improvement, remain higher in AM typically still -NovoLog 70/30 70 units at breakfast 60 units at supper   -increased to 75u at dinner 4/26   -increase am 70/30 to 75 -Glucophage XR 1000mg  daily    CBG (last 3)  Recent Labs    02/02/19 1700 02/02/19 2103 02/03/19 0632  GLUCAP 191* 165* 152*    10. Hypertension. Cozaar 100 mg daily. Monitor with increased mobility Vitals:   02/02/19 1926 02/03/19 0444  BP: (!) 147/72 138/73  Pulse: 72 67  Resp: 18 18  Temp: 98.1 F (36.7 C) 97.9 F (36.6 C)  SpO2: 93% 97%  fair to borderline control 4/28 11. Tobacco abuse. Provide counseling 12. Pravachol 40 mg daily 13. Constipation. Laxative assistance    LOS: 11 days A FACE TO FACE EVALUATION WAS PERFORMED  Ranelle Oyster 02/03/2019, 9:09 AM

## 2019-02-03 NOTE — Patient Care Conference (Signed)
Inpatient RehabilitationTeam Conference and Plan of Care Update Date: 02/03/2019   Time: 2:10 PM    Patient Name: Danielle Buchanan      Medical Record Number: [id number]  Date of Birth: 1956-09-04 Sex: Female         Room/Bed: 4W17C/4W17C-01 Payor Info: Payor: [employer] MEDICARE / Plan: [employer] MEDICARE HMO / Product Type: *No Product type* /    Admitting Diagnosis: R BKA  Admit Date/Time:  01/23/2019  2:37 PM Admission Comments: No comment available   Primary Diagnosis:  <principal problem not specified> Principal Problem: <principal problem not specified>  Patient Active Problem List   Diagnosis Date Noted  . Right below-knee amputee (HCC) 01/23/2019  . Gangrene of right foot (HCC) 01/17/2019  . History of transmetatarsal amputation of right foot (HCC) 12/13/2018  . At risk for adverse drug event 11/27/2018  . Osteomyelitis of third toe of right foot (HCC) 11/12/2018  . Pain in finger of right hand 02/20/2018  . Atherosclerosis of native arteries of the extremities with gangrene (HCC) 11/01/2017  . Unilateral AKA, left (HCC)   . Post-operative pain   . OSA (obstructive sleep apnea)   . Benign essential HTN   . DM (diabetes mellitus), secondary, uncontrolled, with peripheral vascular complications (HCC)   . Hyperkalemia   . Leukocytosis   . Acute blood loss anemia   . Amputation stump infection (HCC) 08/23/2017  . Amputation of left lower extremity above knee upon examination (HCC) 08/23/2017  . PAD (peripheral artery disease) (HCC) 07/23/2017  . Cellulitis 06/10/2017  . Hyponatremia 06/10/2017  . Fever 06/10/2017  . Rash 05/24/2017  . HTN (hypertension)   . Allergic rhinitis   . Insomnia   . Depression     Expected Discharge Date: Expected Discharge Date: 02/06/19  Team Members Present: Physician leading conference: Dr. Faith Rogue Social Worker Present: Amada Jupiter, LCSW Nurse Present: Other (comment)(Crystal Willeen Cass, RN) PT Present: Other (comment)(Taylor Serita Grit,  PT) OT Present: Amy Rounds, OT PPS Coordinator present : Fae Pippin     Current Status/Progress Goal Weekly Team Focus  Medical   pain better controlled. sugars have been difficult to control. awaiting shrinker  better diabetes control, stump shaping  wound care, dietary/dm education, maximize arousal with pain medications   Bowel/Bladder   urinary urgency  (P) Maintain normal bowel/bladder with Mod I  (P) Assist and assess toileting needs QS/PRN,    Swallow/Nutrition/ Hydration             ADL's   Supervision-CGA sliding board or A/P transfers, mod A toileting,  min A bathing/dressing from bed level  Supervision overall  Functional transfers, ADL re-training, family training, d/c planning   Mobility   Supervision level slide board transfers, min A uneven surface slide board transfers, Supervision w/c mobility  downgraded to Supervision transfers, min A car transfer  transfers, sitting balance, w/c mobility   Communication             Safety/Cognition/ Behavioral Observations            Pain   (P) s/p right BKA has Oxy 5-10 mg Q4 hr prn, and schedule Oxycodone 10 mg at HS , rate pain 7-9 on pain scale  (P) goal 3-4 tolerance  (P) QS assessment/ prn  verbalize pain at goal    Skin   Surgical incision intact with edema, tenderness,stapes intact with no drainage, Ecchymotic areas(bruising) abdomen resolving  Maintain skin integrity with Mod I  QS assessment/prn no dranage      *  See Care Plan and progress notes for long and short-term goals.     Barriers to Discharge  Current Status/Progress Possible Resolutions Date Resolved   Physician    Medical stability        ongoing medical mgt      Nursing                  PT                    OT                  SLP                SW                Discharge Planning/Teaching Needs:  Pt plans to return to her handicap accessible apartment.  Neice and her spouse are local and available to provide assist.  Have confirmed  they can provide 24/7 if recommended.  Teaching needs still TBD   Team Discussion:  No new medical issues.  Continue with pain management.  A-P transfers @ mod independent.  Mod-max for clothing management.  Supervision - min assist with sliding board transfers.  Hope to set up family ed for Thursday.  On track for Friday d/c.  Revisions to Treatment Plan:  NA    Continued Need for Acute Rehabilitation Level of Care: The patient requires daily medical management by a physician with specialized training in physical medicine and rehabilitation for the following conditions: Daily direction of a multidisciplinary physical rehabilitation program to ensure safe treatment while eliciting the highest outcome that is of practical value to the patient.: Yes Daily medical management of patient stability for increased activity during participation in an intensive rehabilitation regime.: Yes Daily analysis of laboratory values and/or radiology reports with any subsequent need for medication adjustment of medical intervention for : Post surgical problems;Diabetes problems   I attest that I was present, lead the team conference, and concur with the assessment and plan of the team.   Amada JupiterHOYLE, Yanina Knupp 02/03/2019, 4:39 PM     Team conference was held via web/ teleconference due to COVID - 19

## 2019-02-03 NOTE — Progress Notes (Signed)
Orthopedic Tech Progress Note Patient Details:  Danielle Buchanan 26-Jan-1956 [id number]  Patient ID: [name], female   DOB: September 17, 1956, 63 y.o.   MRN: [id number]   Saul Fordyce 02/03/2019, 9:41 AMCalled [employer] for right Stump shrinker.

## 2019-02-03 NOTE — Progress Notes (Signed)
Physical Therapy Session Note  Patient Details  Name: Danielle Buchanan MRN: [id number] Date of Birth: 01/28/1956  Today's Date: 02/03/2019 PT Individual Time: 1430-1525 PT Individual Time Calculation (min): 55 min   Short Term Goals: Week 2:  PT Short Term Goal 1 (Week 2): =LTG due to ELOS  Skilled Therapeutic Interventions/Progress Updates:    Patient received up in [facility], pleasant and willing to participate in session but reporting high levels of fatigue this afternoon. Self-propelled WC approximately 30f, then transported to PT gym tIaegerin [facility]for energy conservation. Able to [facility] up [facility] with min VC from this therapist, then able to perform AP transfer to mat table with min guard, extended time,  and VC for offloading of hips for scooting forward, very fatigued by this transfer. Continued working on prone on elbows stretch for hip flexors x5 minutes today, then continued work on [employer] S[employer] with quad set, sidelying hip abduction B. Patient able to roll 360 degrees and change position well on table with extended time and S. Performed sliding board transfer [facility] to [facility] due to significant fatigue) with min guard and extended time, cues for head hips relationship and angle of board placement. Able to self-propel WC 1569fwith distant S, then performed sliding board transfer from chair to bed with min guard, again with cues for head-hips relationship and correct angle of board. She was left in bed with all needs met, bed alarm active this afternoon.   Therapy Documentation Precautions:  Precautions Precautions: Fall Precaution Comments: now bilateral amputee with change to CoG causing increased posterior lean Restrictions Weight Bearing Restrictions: Yes RLE Weight Bearing: Non weight bearing LLE Weight Bearing: Non weight bearing Pain: Pain Assessment Pain Scale: 0-10 Pain Score: 0-No pain Faces Pain Scale: No hurt    Therapy/Group: Individual Therapy    KrDeniece ReeT, DPT, CBIS  Supplemental Physical Therapist [facility]  Pager [phone] Rehab Office [phone]  02/03/2019, 3:58 PM

## 2019-02-03 NOTE — Plan of Care (Signed)
  Problem: Consults Goal: RH LIMB LOSS PATIENT EDUCATION Description Description: See Patient Education module for eduction specifics. Outcome: Progressing Goal: Diabetes Guidelines if Diabetic/Glucose > 140 Description If diabetic or lab glucose is > 140 mg/dl - Initiate Diabetes/Hyperglycemia Guidelines & Document Interventions  Outcome: Not Progressing   Problem: RH SKIN INTEGRITY Goal: RH STG ABLE TO PERFORM INCISION/WOUND CARE W/ASSISTANCE Description STG Able To Perform Incision/Wound Care With mod I Assistance.  Outcome: Progressing   Problem: RH SAFETY Goal: RH STG ADHERE TO SAFETY PRECAUTIONS W/ASSISTANCE/DEVICE Description STG Adhere to Safety Precautions With supervision Assistance/Device.  Outcome: Progressing   Problem: RH PAIN MANAGEMENT Goal: RH STG PAIN MANAGED AT OR BELOW PT'S PAIN GOAL Description < 5  Outcome: Not Progressing

## 2019-02-03 NOTE — Progress Notes (Addendum)
Occupational Therapy Session Note  Patient Details  Name: Danielle Buchanan MRN: [id number] Date of Birth: 08-26-1956  Today's Date: 02/03/2019 OT Individual Time: 1030-1130 and 1315-1400 OT Individual Time Calculation (min): 60 min and 45 min   Short Term Goals: Week 2:  OT Short Term Goal 1 (Week 2): STG=LTG due to LOS  Skilled Therapeutic Interventions/Progress Updates:    Session One: Pt seen for OT session focusing on functional transfers and ADL re-training. Pt sitting up in w/c upon arrival, no complaints of pain. Discussed plan for toileting at home. Pt only with standard BSC, not able to afford private purchasing a drop arm option and insurance already having covered standard BSC. She completed A/P transfer onto standard [facility] with supervision, assist to stabilize equipment and VCs for technique. She was able to complete modified lateral leans to pull pants down, however, due to body habitus and armrails on BSC, difficulty efficiently leaning enough in order to advance brief/pants over hips without assist. Pt becoming fatigue with trials. Therefore, therapist assisted with pulling pants up and opted to transfer back to bed in order to don brief. Completed sliding board transfer to EOB with assist to place and stabilize board and VCs for safety and head/hip relationship. Brief donned total A in supine with pt rolling mod I using hospital bed functions. Donned pants in supine mod I. Pt desiring to stay in supine at end of session, left with all needs in reach and bed alarm on.   Session Two: Pt seen for OT session with cont focus on functional transfers. Pt in supine upon arrival, agreeable to tx session and cont to work on [facility] transfers. Completed A/P transfer EOB<> BSC. Completed with supervision with assist to stabilze equipment, VCs and significantly increased time. Pt with increased fatigue when transferring back to bed. Completed sliding board transfer EOB>w/c with same assist as noted  above.  She self propelled w/c throughout unit for UE strengthening/endurance. In therapy gym, completed anterior weight shift onto physioball x2 sets of 10 focusing on anterior weight shift and core strengthening/stability. Pt returned to room at end of session, left seated in w/c with all needs in reach.  Pt received shrinker btwn sessions. She declined wanting to practice donning/doffing of shrinker this session. Education provided regarding purpose, wear schedule, and care of shrinker.   Therapy Documentation Precautions:  Precautions Precautions: Fall Precaution Comments: now bilateral amputee with change to CoG causing increased posterior lean Restrictions Weight Bearing Restrictions: Yes RLE Weight Bearing: Non weight bearing LLE Weight Bearing: Non weight bearing   Therapy/Group: Individual Therapy  Janiyla Long L 02/03/2019, 7:11 AM

## 2019-02-03 NOTE — Progress Notes (Signed)
Physical Therapy Session Note  Patient Details  Name: Danielle Buchanan MRN: [id number] Date of Birth: 03/07/1956  Today's Date: 02/03/2019 PT Individual Time: 0800-0855 PT Individual Time Calculation (min): 55 min   Short Term Goals: Week 2:  PT Short Term Goal 1 (Week 2): =LTG due to ELOS  Skilled Therapeutic Interventions/Progress Updates:    Pt received seated in bed, agreeable to PT session. No complaints of pain. Rolling L/R with use of bedrails and Supervision to don pants with setup A. Supine to sit with Supervision with use of bed features to elevated HOB and use of bedrails. Slide board transfer bed to w/c with Supervision, v/c, setup A for board placement, increased time. Manual w/c propulsion 2 x 150 ft with use of BUE mod I, increased time needed. Slide board transfer w/c to/from 21" mat table with CGA for balance. Slide board transfer w/c to/from 22" mat table with min A w/c to mat table due to height difference and decreased balance with transfer. Pt is setup A to change shirt due to shirt being wet at end of session. Pt left seated in w/c in room with needs in reach at end of session.  Therapy Documentation Precautions:  Precautions Precautions: Fall Precaution Comments: now bilateral amputee with change to CoG causing increased posterior lean Restrictions Weight Bearing Restrictions: Yes RLE Weight Bearing: Non weight bearing LLE Weight Bearing: Non weight bearing    Therapy/Group: Individual Therapy   Peter Congo, PT, DPT  02/03/2019, 8:58 AM

## 2019-02-04 ENCOUNTER — Inpatient Hospital Stay (HOSPITAL_COMMUNITY): Payer: Medicare HMO

## 2019-02-04 ENCOUNTER — Inpatient Hospital Stay (HOSPITAL_COMMUNITY): Payer: Medicare HMO | Admitting: Physical Therapy

## 2019-02-04 ENCOUNTER — Inpatient Hospital Stay (HOSPITAL_COMMUNITY): Payer: Medicare HMO | Admitting: Occupational Therapy

## 2019-02-04 LAB — GLUCOSE, CAPILLARY
Glucose-Capillary: 185 mg/dL — ABNORMAL HIGH (ref 70–99)
Glucose-Capillary: 144 mg/dL — ABNORMAL HIGH (ref 70–99)
Glucose-Capillary: 209 mg/dL — ABNORMAL HIGH (ref 70–99)
Glucose-Capillary: 87 mg/dL (ref 70–99)

## 2019-02-04 NOTE — Progress Notes (Signed)
Occupational Therapy Session Note  Patient Details  Name: Danielle Buchanan MRN: [id number] Date of Birth: 24-Sep-1956  Today's Date: 02/04/2019 OT Individual Time: 6468-0321 OT Individual Time Calculation (min): 57 min    Short Term Goals: Week 2:  OT Short Term Goal 1 (Week 2): STG=LTG due to LOS  Skilled Therapeutic Interventions/Progress Updates:    Pt seen for OT session focusig on functional transfers and ADL re-training with education on shrinker. Pt sitting up in supine upon arrival, no formal complaints of pain and reports being pre-medicated prior to tx session. SHe reports having compelted athing/dressig from bed level with set-up from nurisng staff.  She transferred to sitting EOB with supervision using hospital bed functions. Able to direct therapist with set-up of sliding board transfer with min questioning cues. Supervision sliding board transfer to w/c with assist to stabilze equipment. Completed A/P transfer w/c<> standard BSC. Completed with significantly increased time and assist to stabilze equipment with VCs for technique. She completed modified lateral leans in order to get pants down. Increased assist required for pulling pants back up over hips. Lateral leans insufficient on small BSC. Pt demonstrated ability to complete push-up while using armrests of BSC and therapist able to pull pants remainder of way over hips. Transferred back onto w/c in same manner as described above. Extensive education and discussion regarding energy conservation and modified ADLs/toileting tasks including efficient movements and having increased assist with tasks in order to conserve energy needed for other tasks or transfers. Addressed care and donning/doffing techniques of pt's new shrinker., With increased time and encouragement, pt able to doff shrinker and washed it in sink. Required max A for donning new shrinker with VCs for technique and deep breathing techniques due to increased pain. Provided  education on increasing stimuli and tolerance to touch on residual limb. Pt left seated in w/c at end of session, completing grooming tasks at sink with all needs in reach.   Therapy Documentation Precautions:  Precautions Precautions: Fall Precaution Comments: now bilateral amputee with change to CoG causing increased posterior lean Restrictions Weight Bearing Restrictions: Yes RLE Weight Bearing: Non weight bearing LLE Weight Bearing: Non weight bearing   Therapy/Group: Individual Therapy  Derra Shartzer L 02/04/2019, 7:00 AM

## 2019-02-04 NOTE — Progress Notes (Signed)
Physical Therapy Session Note  Patient Details  Name: Danielle Buchanan MRN: [id number] Date of Birth: [birth year]  Today's Date: 02/04/2019 PT Individual Time: 1330-1425 PT Individual Time Calculation (min): 55 min   Short Term Goals: Week 2:  PT Short Term Goal 1 (Week 2): =LTG due to ELOS  Skilled Therapeutic Interventions/Progress Updates:    Pt received seated in w/c in room, agreeable to PT session. See pain details below. Manual w/c propulsion x 150 ft with use of BUE and Supervision. Slide board transfer w/c to recliner with setup A. Slide board transfer recliner chair to w/c without cushion for lower surface height with max A due to uphill transfer. Slide board w/c to/from mat table with setup A. Pt exhibits decreased time needed to complete transfers this date. Supine, sidelying, and prone BLE therex x 10-15 reps: hip flex, hip abd, quad set, hip ext, hip abd against gravity, prone hip flexor stretch. Handout for HEP provided. Pt left seated in bed with needs in reach, bed alarm in place.  Therapy Documentation Precautions:  Precautions Precautions: Fall Precaution Comments: now bilateral amputee with change to CoG causing increased posterior lean Restrictions Weight Bearing Restrictions: Yes RLE Weight Bearing: Non weight bearing LLE Weight Bearing: Non weight bearing Pain: Pain Assessment Pain Score: 8  Pain Type: Surgical pain Pain Location: Leg Pain Orientation: Right Pain Descriptors / Indicators: Sore;Tightness Pain Onset: On-going Pain Intervention(s): Repositioned Multiple Pain Sites: Yes 2nd Pain Site Pain Score: 9 Pain Type: Neuropathic pain Pain Location: Hand Pain Orientation: Right;Left Pain Descriptors / Indicators: Tingling;Burning Pain Onset: On-going Pain Intervention(s): RN made aware    Therapy/Group: Individual Therapy   Peter Congo, PT, DPT  02/04/2019, 2:28 PM

## 2019-02-04 NOTE — Progress Notes (Signed)
[facility] PHYSICAL MEDICINE & REHABILITATION PROGRESS NOTE   Subjective/Complaints:  Tolerating shrinker fairly well. Leg remains tender. No problems overnight  ROS: Patient denies fever, rash, sore throat, blurred vision, nausea, vomiting, diarrhea, cough, shortness of breath or chest pain,  headache, or mood change.      Objective:   No results found. Recent Labs    02/03/19 0550  WBC 8.4  HGB 12.9  HCT 37.2  PLT 188   Recent Labs    02/03/19 0550  NA 130*  K 4.0  CL 94*  CO2 23  GLUCOSE 127*  BUN 19  CREATININE 0.56  CALCIUM 9.2    Intake/Output Summary (Last 24 hours) at 02/04/2019 0951 Last data filed at 02/03/2019 1940 Gross per 24 hour  Intake 462 ml  Output 25 ml  Net 437 ml     Physical Exam: Vital Signs Blood pressure (!) 157/84, pulse 73, temperature 98 F (36.7 C), temperature source Oral, resp. rate 15, height 5\' 3"  (1.6 m), weight 93.1 kg, SpO2 98 %. Constitutional: No distress . Vital signs reviewed. HEENT: EOMI, oral membranes moist Neck: supple Cardiovascular: RRR without murmur. No JVD    Respiratory: CTA Bilaterally without wheezes or rales. Normal effort    GI: BS +, non-tender, non-distended  Musculoskeletal:  Comments: right BK less tender Neurological: alert and  oriented to person, place, and time. Nocranial nerve deficit.Coordinationnormal. Reasonable insight and awareness  Skin: Skin iswarm. AKA well-healed. Right BK clean with staples intact Psychiatric: cooperative   Assessment/Plan: 1. Functional deficits secondary to right BKA which require 3+ hours per day of interdisciplinary therapy in a comprehensive inpatient rehab setting.  Physiatrist is providing close team supervision and 24 hour management of active medical problems listed below.  Physiatrist and rehab team continue to assess barriers to discharge/monitor patient progress toward functional and medical goals  Care Tool:  Bathing    Body parts  bathed by patient: Right arm, Left arm, Chest, Abdomen, Front perineal area, Buttocks, Right upper leg, Left upper leg, Face     Body parts n/a: Right lower leg, Left lower leg   Bathing assist Assist Level: Minimal Assistance - Patient > 75%     Upper Body Dressing/Undressing Upper body dressing   What is the patient wearing?: Pull over shirt    Upper body assist Assist Level: Supervision/Verbal cueing    Lower Body Dressing/Undressing Lower body dressing      What is the patient wearing?: Pants, Incontinence brief     Lower body assist Assist for lower body dressing: Moderate Assistance - Patient 50 - 74%     Toileting Toileting    Toileting assist Assist for toileting: Maximal Assistance - Patient 25 - 49%     Transfers Chair/bed transfer  Transfers assist     Chair/bed transfer assist level: Contact Guard/Touching assist     Locomotion Ambulation   Ambulation assist   Ambulation activity did not occur: N/A(bilat amputee)          Walk 10 feet activity   Assist  Walk 10 feet activity did not occur: N/A        Walk 50 feet activity   Assist Walk 50 feet with 2 turns activity did not occur: N/A         Walk 150 feet activity   Assist Walk 150 feet activity did not occur: N/A         Walk 10 feet on uneven surface  activity   Assist Walk 10  feet on uneven surfaces activity did not occur: N/A         Wheelchair     Assist Will patient use wheelchair at discharge?: Yes Type of Wheelchair: Manual    Wheelchair assist level: Independent Max wheelchair distance: 16050ft    Wheelchair 50 feet with 2 turns activity    Assist        Assist Level: Independent   Wheelchair 150 feet activity     Assist Wheelchair 150 feet activity did not occur: Safety/medical concerns   Assist Level: Independent    Medical Problem List and Plan: 1.Decreased functional mobilitysecondary to right BKA 01/19/2019 as well as  history of left AKA 2018 -Continue CIR therapies including PT, OT  -pt wearing shrinker 2. Antithrombotics: -DVT/anticoagulation:Subcutaneous heparin -antiplatelet therapy: aspirin 81 mg daily    3. Pain Management:Elavil 25 mg daily at bedtime,Neurontin 300 mg daily at bedtime,oxycodone as needed   oxyCR 10mg  qhs helping     -elavil at night stopped---with improvement in sedation -continue massage and desensitization for R BKA  -continue edema control with ACE  -gradually improving 4. Mood:trazodone 50 mg daily at bedtime, Zoloft 100 mg daily, Ativan 0.5 mg twice a day as needed -antipsychotic agents: N/A 5. Neuropsych: This patientiscapable of making decisions on herown behalf. 6. Skin/Wound Care:Routine skin checks  - now in shrinker 7. Fluids/Electrolytes/Nutrition: encourage po 8. CAD. Continue aspirin therapy. No chest pain 9. Diabetes mellitus with peripheral neuropathy. Hemoglobin A1c 8.5.  -cbg's with improvement, remain higher in AM typically still -NovoLog 70/30 70 units at breakfast 60 units at supper   -increased to 75u at dinner 4/26   -increased am 70/30 to 75 -Glucophage XR 1000mg  daily  -dietary compliance an issue, education    CBG (last 3)  Recent Labs    02/03/19 1632 02/03/19 2105 02/04/19 0624  GLUCAP 136* 176* 185*    10. Hypertension. Cozaar 100 mg daily. Monitor with increased mobility Vitals:   02/03/19 1937 02/04/19 0520  BP: 128/73 (!) 157/84  Pulse: 79 73  Resp: 18 15  Temp: 97.7 F (36.5 C) 98 F (36.7 C)  SpO2: 97% 98%  fair to borderline control 4/29 11. Tobacco abuse. Provide counseling 12. Pravachol 40 mg daily 13. Constipation. Laxative assistance    LOS: 12 days A FACE TO FACE EVALUATION WAS PERFORMED  Ranelle OysterZachary T Reynaldo Rossman 02/04/2019, 9:51 AM

## 2019-02-04 NOTE — Progress Notes (Signed)
Social Work Patient ID: Danielle Buchanan, female   DOB: [birth year], 63 y.o.   MRN: [id number]  Have reviewed team conference with pt and nephew, Tammy Sours.  All ready for d/c on Friday.  Have planned for family education tomorrow morning with pt's niece, Misty Stanley beginning at 10:30am.  Will check in with pt/neice tomorrow to review d/c referrals put in place.    Nyle Limb, LCSW

## 2019-02-04 NOTE — Progress Notes (Signed)
Physical Therapy Session Note  Patient Details  Name: Danielle Buchanan MRN: [id number] Date of Birth: Jul 31, 1956  Today's Date: 02/04/2019 PT Individual Time: 7416-3845 PT Individual Time Calculation (min): 74 min   Short Term Goals: Week 2:  PT Short Term Goal 1 (Week 2): =LTG due to ELOS  Skilled Therapeutic Interventions/Progress Updates:   Patient in room in w/c agreeable to PT.   Patient propelled w/c 150' mod I.  In dayroom transferred to mat with SBT with close S cues for safe board placement and increased time.  Patient c/o hand pain due to neuropathy vs pinched nerve in her neck.  Seated to doff shrinker on R LE increased time and c/o severe pain.  Patient c/o pain and unable to initiate donning on her own.  Donned shrinker initializing with mostly inside out and pt able to take over once initiated on end of residual limb, but in severe pain after.  Pt sit to supine with S increased time.  Reported some pain relief in hands when rubbing her neck so performed manual traction in supine and pt reported improvement in pain to 6/10.  Patient performed supine AKA therex as noted below. Supine to sit with min A on mat from sidelying.  Patient seated for using pushup blocks for chair push ups with A to keep them stable.  Performed SBT back to w/c with close S.  Patient in w/c assisted to ortho gym for car transfer performed with mod A.  Patient assisted in w/c to room and left in chair with call bell and needs in reach.   Therapy Documentation Precautions:  Precautions Precautions: Fall Precaution Comments: now bilateral amputee with change to CoG causing increased posterior lean Restrictions Weight Bearing Restrictions: Yes RLE Weight Bearing: Non weight bearing LLE Weight Bearing: Non weight bearing Pain: Pain Assessment Pain Scale: 0-10 Pain Score: 8  Pain Type: Surgical pain Pain Location: Leg Pain Orientation: Right Pain Descriptors / Indicators: Sore;Tightness Pain Onset:  On-going Pain Intervention(s): Repositioned Multiple Pain Sites: Yes 2nd Pain Site Pain Score: 9 Pain Type: Neuropathic pain Pain Location: Hand Pain Orientation: Right;Left Pain Descriptors / Indicators: Tingling;Burning Pain Onset: On-going Pain Intervention(s): RN made aware Exercises: Amputee Exercises Gluteal Sets: Strengthening;Both;10 reps;Supine Towel Squeeze: Strengthening;Both;10 reps;Supine Hip Extension: Strengthening;10 reps;Sidelying;Right Hip ABduction/ADduction: Strengthening;Right;10 reps;Sidelying Hip Flexion/Marching: Strengthening;Right;10 reps;Supine Knee Extension: Strengthening;Right;10 reps;Supine Straight Leg Raises: Strengthening;Right;10 reps;Supine    Therapy/Group: Individual Therapy  Elray Mcgregor  Sheran Lawless, PT 02/04/2019, 11:19 AM

## 2019-02-05 ENCOUNTER — Encounter (HOSPITAL_COMMUNITY): Payer: Medicare HMO | Admitting: Occupational Therapy

## 2019-02-05 ENCOUNTER — Inpatient Hospital Stay (HOSPITAL_COMMUNITY): Payer: Medicare HMO | Admitting: Physical Therapy

## 2019-02-05 ENCOUNTER — Inpatient Hospital Stay (HOSPITAL_COMMUNITY): Payer: Medicare HMO

## 2019-02-05 ENCOUNTER — Ambulatory Visit (HOSPITAL_COMMUNITY): Payer: Medicare HMO | Admitting: Physical Therapy

## 2019-02-05 LAB — GLUCOSE, CAPILLARY
Glucose-Capillary: 154 mg/dL — ABNORMAL HIGH (ref 70–99)
Glucose-Capillary: 102 mg/dL — ABNORMAL HIGH (ref 70–99)
Glucose-Capillary: 109 mg/dL — ABNORMAL HIGH (ref 70–99)
Glucose-Capillary: 114 mg/dL — ABNORMAL HIGH (ref 70–99)

## 2019-02-05 MED ORDER — GLIMEPIRIDE 2 MG PO TABS
2.0000 mg | ORAL_TABLET | Freq: Every day | ORAL | Status: DC
  Administered 2019-02-05 – 2019-02-06 (×2): 2 mg via ORAL
  Filled 2019-02-05 (×2): qty 1

## 2019-02-05 NOTE — Discharge Instructions (Signed)
Inpatient Rehab Discharge Instructions  Danielle Buchanan [facility] Discharge date and time: No discharge date for patient encounter.   Activities/Precautions/ Functional Status: Activity: activity as tolerated Diet: diabetic diet Wound Care: keep wound clean and dry Functional status:  ___ No restrictions     ___ Walk up steps independently ___ 24/7 supervision/assistance   ___ Walk up steps with assistance ___ Intermittent supervision/assistance  ___ Bathe/dress independently ___ Walk with walker     __x_ Bathe/dress with assistance ___ Walk Independently    ___ Shower independently ___ Walk with assistance    ___ Shower with assistance ___ No alcohol     ___ Return to work/school ________    COMMUNITY REFERRALS UPON DISCHARGE:    Home Health:   PT     OT    RN                     Agency:  [facility]    Phone: [phone]   Medical Equipment/Items Ordered:  Hospital bed                                                       Agency/Supplier:  [facility] @ [phone]    GENERAL COMMUNITY RESOURCES FOR PATIENT/FAMILY:  Support Groups:  Amputee Support Group (see handout)       Special Instructions: No smoking or alcohol   My questions have been answered and I understand these instructions. I will adhere to these goals and the provided educational materials after my discharge from the hospital.  Patient/Caregiver Signature _______________________________ Date __________  Clinician Signature _______________________________________ Date __________  Please bring this form and your medication list with you to all your follow-up doctor's appointments.

## 2019-02-05 NOTE — Progress Notes (Signed)
Physical Therapy Discharge Summary  Patient Details  Name: Danielle Buchanan MRN: [id number] Date of Birth: June 19, 1956  Today's Date: 02/05/2019 PT Individual Time: 1130-1230 PT Individual Time Calculation (min): 60 min    Patient has met 6 of 6 long term goals due to improved activity tolerance, improved balance, improved postural control, increased strength and ability to compensate for deficits.  Patient to discharge at a wheelchair level Supervision.   Patient's care partner is independent to provide the necessary physical assistance at discharge.  Reasons goals not met: Pt has met all rehab goals.  Recommendation:  Patient will benefit from ongoing skilled PT services in home health setting to continue to advance safe functional mobility, address ongoing impairments in balance, endurance, strength, independence with functional mobility, and minimize fall risk.  Equipment: Pt already owns a manual w/c, slide board, and has a wheelchair cushion. Provided pt with a R residual limb support.  Reasons for discharge: treatment goals met and discharge from hospital  Patient/family agrees with progress made and goals achieved: Yes   Skilled Intervention: Pt received seated in w/c in dayroom from OT session, agreeable to PT. Pt reports ongoing pain in R residual limb due to shrinker, not rated and declines intervention. [name] Danielle Buchanan present during therapy session for hands-on family training. Manual w/c propulsion x 150 ft with use of BUE mod I. Pt is able to manage w/c parts independently. Car transfer via slide board with min A, demonstrated transfer with patient for her niece then had niece perform return-demo of transfer. Education with patient and her niece about HEP, residual limb postioning, transfers at home, etc. Patient and her niece demo good safety and understanding of education. Pt will be safe to d/c home tomorrow at Supervision level overall. Pt left seated in room in w/c with  needs in reach at end of session.  PT Discharge Precautions/Restrictions Precautions Precautions: Fall Restrictions Weight Bearing Restrictions: Yes RLE Weight Bearing: Non weight bearing LLE Weight Bearing: Weight bearing as tolerated Vision/Perception  Perception Perception: Within Functional Limits Praxis Praxis: Intact  Cognition Overall Cognitive Status: Within Functional Limits for tasks assessed Arousal/Alertness: Awake/alert Orientation Level: Oriented X4 Attention: Selective Selective Attention: Appears intact Memory: Appears intact Awareness: Appears intact Problem Solving: Appears intact Safety/Judgment: Appears intact Sensation Sensation Light Touch: Impaired Detail Light Touch Impaired Details: Impaired RLE;Impaired LUE;Impaired RUE Proprioception: Appears Intact Additional Comments: hx nerve/sensation impairment B hands Coordination Gross Motor Movements are Fluid and Coordinated: No Fine Motor Movements are Fluid and Coordinated: No Coordination and Movement Description: Impaired 2/2 new R BKA and old L AKA; digits 3-5 numb on B hands Motor  Motor Motor: Within Functional Limits Motor - Skilled Clinical Observations: generalized weakness Motor - Discharge Observations: generalized weakness  Mobility Bed Mobility Bed Mobility: Rolling Right;Rolling Left;Supine to Sit;Sit to Supine Rolling Right: Independent with assistive device Rolling Left: Independent with assistive device Supine to Sit: Independent with assistive device Sit to Supine: Independent with assistive device Transfers Transfers: Lateral/Scoot Transfers Lateral/Scoot Transfers: Supervision/Verbal cueing Transfer (Assistive device): Other (Comment)(slide board) Locomotion  Gait Ambulation: No Gait Gait: No Stairs / Additional Locomotion Stairs: No Architect: Yes Wheelchair Assistance: Independent with Camera operator: Both upper  extremities Wheelchair Parts Management: Independent Distance: 150  Trunk/Postural Assessment  Cervical Assessment Cervical Assessment: Exceptions to WFL(forward head) Thoracic Assessment Thoracic Assessment: Exceptions to WFL(kyphotic; rounded shoulders) Lumbar Assessment Lumbar Assessment: Exceptions to WFL(posterior pelvic tilt) Postural Control Postural Control: Deficits on evaluation Righting Reactions: delayed;  insufficient  Balance Balance Balance Assessed: Yes Static Sitting Balance Static Sitting - Balance Support: No upper extremity supported;Feet unsupported Static Sitting - Level of Assistance: 6: Modified independent (Device/Increase time) Dynamic Sitting Balance Dynamic Sitting - Balance Support: Feet unsupported;No upper extremity supported Dynamic Sitting - Level of Assistance: 5: Stand by assistance Extremity Assessment   RLE Assessment RLE Assessment: Exceptions to [facility] Passive Range of Motion (PROM) Comments: R BKA General Strength Comments: grossly 3/5 LLE Assessment LLE Assessment: Exceptions to [facility] Passive Range of Motion (PROM) Comments: L AKA General Strength Comments: hip 3/5     Excell Seltzer, PT, DPT 02/05/2019, 3:34 PM

## 2019-02-05 NOTE — Progress Notes (Signed)
Occupational Therapy Discharge Summary  Patient Details  Name: Danielle Buchanan MRN: [id number] Date of Birth: 07-11-1956   Patient has met 7 of 7 long term goals due to improved activity tolerance, improved balance, postural control, ability to compensate for deficits and improved coordination.  Patient to discharge at overall Supervision level.  Patient's care partner is independent to provide the necessary physical assistance at discharge.  [name] and niece will be primary caregivers at d/c and report ability to provide the care pt will require. Her niece has come in to complete hands on family education training. She voices and demonstrates willing and ableness to provide necessary assist at d/c. Recommend only A/P transfers when pt is alone and using sliding board only when assist is present.  Pt is completing functional transfers A/P or using slide board at overall supervision level with assist to place sliding board and stabilze equipment. At this time, pt is completing bathing/dressing from bed level. She will d/c home with hospital bed.   Recommendation:  Patient will benefit from ongoing skilled OT services in home health setting to continue to advance functional skills in the area of BADL and Reduce care partner burden.  Equipment: No equipment provided; pt has BSC  Reasons for discharge: treatment goals met and discharge from hospital  Patient/family agrees with progress made and goals achieved: Yes  OT Discharge Precautions/Restrictions  Precautions Precautions: Fall Restrictions Weight Bearing Restrictions: Yes LLE Weight Bearing: Weight bearing as tolerated Vision Baseline Vision/History: No visual deficits Patient Visual Report: No change from baseline Vision Assessment?: No apparent visual deficits Perception  Perception: Within Functional Limits Praxis Praxis: Intact Cognition Overall Cognitive Status: Within Functional Limits for tasks  assessed Arousal/Alertness: Awake/alert Orientation Level: Oriented X4 Selective Attention: Appears intact Memory: Appears intact Awareness: Appears intact Problem Solving: Appears intact Safety/Judgment: Appears intact Sensation Sensation Light Touch: Impaired Detail Light Touch Impaired Details: Impaired RLE;Impaired LUE;Impaired RUE Proprioception: Appears Intact Additional Comments: hx nerve/sensation impairment B hands Coordination Gross Motor Movements are Fluid and Coordinated: No Fine Motor Movements are Fluid and Coordinated: No Coordination and Movement Description: Impaired 2/2 new R BKA and old L AKA; digits 3-5 numb on B hands Motor  Motor Motor: Within Functional Limits Motor - Discharge Observations: generalized weakness Trunk/Postural Assessment  Cervical Assessment Cervical Assessment: Exceptions to WFL(Forward head) Thoracic Assessment Thoracic Assessment: Exceptions to WFL(Kyphotic; Rounded shoulders) Lumbar Assessment Lumbar Assessment: Exceptions to WFL(Posterior pelvic tilt) Postural Control Postural Control: Deficits on evaluation Righting Reactions: delayed; insufficient  Balance Balance Balance Assessed: Yes Static Sitting Balance Static Sitting - Balance Support: No upper extremity supported;Feet unsupported Static Sitting - Level of Assistance: 6: Modified independent (Device/Increase time) Static Sitting - Comment/# of Minutes: Sitting EOB Dynamic Sitting Balance Dynamic Sitting - Balance Support: Feet unsupported;No upper extremity supported Dynamic Sitting - Level of Assistance: 5: Stand by assistance Sitting balance - Comments: Sitting on BSC to complete clothing management in prep for toileting task Extremity/Trunk Assessment RUE Assessment RUE Assessment: Within Functional Limits General Strength Comments: Generalized weakness, however, able to use WFL LUE Assessment LUE Assessment: Within Functional Limits General Strength Comments:  Generalized weakness, however, able to use [facility]   Danielle Buchanan L 02/05/2019, 9:58 AM

## 2019-02-05 NOTE — Progress Notes (Signed)
Occupational Therapy Session Note  Patient Details  Name: Danielle Buchanan MRN: [id number] Date of Birth: [birth year]  Today's Date: 02/05/2019 OT Individual Time: 1020-1130 OT Individual Time Calculation (min): 70 min    Short Term Goals: Week 2:  OT Short Term Goal 1 (Week 2): STG=LTG due to LOS  Skilled Therapeutic Interventions/Progress Updates:    Pt seen for OT session focusing on caregiver education and d/c planning. Pt sitting up in w/c upon arrival, mild complaints of R residual limb pain, however, reports being pre-medicated and agreeable to tx session. [name] who will be primary caregiver present for tx session. She took videos of transfers and education throughout session as a reference for her husband who will also be care giving.  Completed functional transfers throughout session including w/c<> BSC via A/P method with supervision, sliding board w/c>bed. Posterior transfer EOB> w/c and sliding board transfer w/c<> low recliner. Each transfer completed with supervision and assist to stabilize equipment, VCs for weight shift and head/hip relationship.  Completed simulated toileting task, pt able to pull pants down, however, required min A to pull pants entirely up following void. She was able to complete hygiene from anterior approach. Pt will require min A for toileting at d/c. Discussed modified ADLs and positioning. Bating/dressing to be completed from bed level for increased safety and independence with task.  Recommending pt complete sliding board transfers only when someone is with her and complete A/P transfers only when alone, pt and caregiver voiced understanding.  Education provided for donning/doffing of shrinker, care of shrinker and purpose, pt able to don/doff with min A. Pt left seated in w/c at end of session with caregiver present and hand off to PT.   Therapy Documentation Precautions:  Precautions Precautions: Fall Precaution Comments: now bilateral amputee  with change to CoG causing increased posterior lean Restrictions Weight Bearing Restrictions: Yes RLE Weight Bearing: Non weight bearing LLE Weight Bearing: Non weight bearing   Therapy/Group: Individual Therapy  Drenda Sobecki L 02/05/2019, 7:03 AM

## 2019-02-05 NOTE — Progress Notes (Signed)
Physical Therapy Note  Patient Details  Name: Danielle Buchanan MRN: [id number] Date of Birth: 1955/11/04 Today's Date: 02/05/2019    Attempted to see patient for scheduled PM session. Pt declines participation this PM due to fatigue. Pt missed 60 min of scheduled skilled therapy services due to fatigue.   Peter Congo, PT, DPT  02/05/2019, 3:41 PM

## 2019-02-05 NOTE — Progress Notes (Signed)
Physical Therapy Session Note  Patient Details  Name: Danielle Buchanan MRN: [id number] Date of Birth: 1956-01-20  Today's Date: 02/05/2019 PT Individual Time: 0830-0900 PT Individual Time Calculation (min): 30 min   Short Term Goals: Week 1:  PT Short Term Goal 1 (Week 2): =LTG due to ELOS  Skilled Therapeutic Interventions/Progress Updates:    Patient in w/c ready for PT.  Performed grooming in chair indep.  Patient propelled in w/c to dayroom mod I.  Transfer to simulated car height on mat @22 -23" with min A increased time, cues for safe board placement.  Patient sit to supine to fix shorts with S.  Supine to sit min A due to flat mat.  Patient seated for chin tuck exercise for relieving pressure on cervical nerves pt reports helps with neuropathy in her hands.  Performed transfer back to w/c S.  Issued to HEP chin tuck exercise.  Patient verbalized and demo understanding.  Discussed plan if she falls at home.  Reports keeps phone in pocket on side of her electric scooter and would call neighbors if unharmed or 911 if injured.  Patient propelled to room mod I and left with needs in reach.   Therapy Documentation Precautions:  Precautions Precautions: Fall Precaution Comments: now bilateral amputee with change to CoG causing increased posterior lean Restrictions Weight Bearing Restrictions: Yes RLE Weight Bearing: Non weight bearing LLE Weight Bearing: Non weight bearing Pain: Pain Assessment Pain Score: 0-No pain    Therapy/Group: Individual Therapy  Elray Mcgregor  [place], PT 02/05/2019, 9:05 AM

## 2019-02-05 NOTE — Discharge Summary (Signed)
Physician Discharge Summary  Patient ID: Danielle Buchanan MRN: [id number] DOB/AGE: July 13, 1956 63 y.o.  Admit date: 01/23/2019 Discharge date: 02/06/2019  Discharge Diagnoses:  Active Problems:   Right below-knee amputee [facility])   Discharged Condition: Stable  Significant Diagnostic Studies: Dg Foot Complete Right  Result Date: 01/17/2019 CLINICAL DATA:  Right foot stump infection. Hx of diabetes, hypertension, right foot transmetatarsal amputation. EXAM: RIGHT FOOT COMPLETE - 3+ VIEW COMPARISON:  Plain film of the RIGHT foot dated 11/11/2018. FINDINGS: Interval amputation at the first through fifth TMT joints. At least some fragmentation overlying the distal margin of the first cuneiform bone, of uncertain acuity and could be benign postsurgical change or sequela of osteomyelitis. Additional fragmentation adjacent to the lateral margin of the cuboid bone appears chronic. Probable soft tissue edema. IMPRESSION: 1. Interval amputation at the first through fifth TMT joints. At least some fragmentation overlying the distal margin of the first cuneiform bone, of uncertain acuity and could be benign postsurgical change or sequela of osteomyelitis. 2. Probable soft tissue edema over the midfoot. No soft tissue gas seen. Electronically Signed   By: Bary Richard M.D.   On: 01/17/2019 13:41    Labs:  Basic Metabolic Panel: Recent Labs  Lab 02/03/19 0550  NA 130*  K 4.0  CL 94*  CO2 23  GLUCOSE 127*  BUN 19  CREATININE 0.56  CALCIUM 9.2    CBC: Recent Labs  Lab 02/03/19 0550  WBC 8.4  HGB 12.9  HCT 37.2  MCV 82.5  PLT 188    CBG: Recent Labs  Lab 02/04/19 2107 02/05/19 0658 02/05/19 1229 02/05/19 1650 02/05/19 2133  GLUCAP 87 154* 102* 109* 114*   Family history Mother with CAD as well as alcohol abuse.  Father with CAD myocardial infarction.  Sister with CAD myocardial infarction.  Sister with hyperlipidemia negative for diabetes  Brief HPI:    Danielle Buchanan is a  63 year old right-handed female with history of type 2 diabetes mellitus, CAD with tobacco abuse, TBI with memory deficits, hypertension, left AKA 2018 as well as recent right fourth amputation of toe 11/11/2018 had been discharged to a skilled nursing facility before returning home.  Patient lives alone handicap accessible apartment primarily used a wheelchair prior to admission although she does have a prosthesis for left AKA.  She has a niece and nephew who plan to assist on discharge.  Presented 01/19/2019 with gangrenous right foot ischemic changes.  Limb was not felt to be salvageable and underwent right BKA 01/19/2019 per Dr. Darrick Penna.  Hospital course pain management.  Stump sock applied.  Subcutaneous heparin for DVT prophylaxis.  Therapy evaluations completed and patient was admitted for a comprehensive rehab program  Hospital Course: MARIROSE DEVENEY was admitted to rehab 01/23/2019 for inpatient therapies to consist of PT, ST and OT at least three hours five days a week. Past admission physiatrist, therapy team and rehab RN have worked together to provide customized collaborative inpatient rehab.  Pertaining to patient right BKA 01/19/2019 as well as history of left AKA 2018.  Shrinker as directed.  Patient would follow-up with vascular surgery.  Surgical site healing nicely.  Patient remains on subcutaneous heparin for DVT prophylaxis.  Pain management with the use of Elavil as well as Neurontin.  Initially receiving scheduled oxycodone at bedtime for pain control.  Mood stabilization with trazodone at bedtime as well as Zoloft with Ativan as needed.  Emotional support provided.  She had no chest pain or shortness of breath  she remained on aspirin therapy.  Blood sugars overall controlled hemoglobin A1c of 8.5 insulin therapy as directed as well as Glucophage but  Caused some stomach nausea and changed to Amaryl.  Full diabetic teaching completed.  Blood pressures controlled and monitored with Cozaar and she  would follow-up with her primary MD.  Pravachol ongoing for hyperlipidemia.  She did have a history of tobacco abuse maintained on Nicorette gum counseling provided in regards to cessation of nicotine products and it was questionable she would be compliant with these request.   Rehab course: During patient's stay in rehab weekly team conferences were held to monitor patient's progress, set goals and discuss barriers to discharge. At admission, patient required moderate assist lateral scoot transfers, moderate assist supine to sit.  Minimal assist upper body bathing and dressing moderate assist lower body bathing max assist lower body dressing.  Moderate assist toilet transfers  Physical exam.  Blood pressure 162/76 pulse 67 temperature 97.9 respirations 18 oxygen saturations 98% room air. Constitutional.  Alert and oriented HEENT Head.  Normocephalic and atraumatic Eyes.  Round and reactive to light no discharge without nystagmus EOMs normal Neck.  Normal range of motion no tracheal deviation no thyromegaly without JVD Cardiovascular normal rate and rhythm no friction rub or murmur heard Respiratory.  Effort normal no respiratory distress no wheezes no rails GI.  Soft exhibits no distention nontender without rebound Musculoskeletal right BKA very tender to palpation Neurological alert and oriented no cranial nerve deviation coordination normal follows full commands  /She  has had improvement in activity tolerance, balance, postural control as well as ability to compensate for deficits. He/She has had improvement in functional use RUE/LUE  and RLE/LLE as well as improvement in awareness.  Patient propels wheelchair supervision.  Sliding board transfers wheelchair to recliner with set up.  Slide board transfers recliner to chair to wheelchair without cushion for lower surface height with max assist.  Supine, side-lying and prone bilateral lower extremities working with range of motion.  Activities of  daily living energy conservation techniques transferred to sitting edge of bed with supervision using hospital bed functions.  Able to direct therapist with set up a slide board transfers with minimal cueing.  Completed anterior posterior transfers wheelchair to standard bedside commode.  Full family teaching was completed and plan discharged to home       Disposition: Discharge disposition: 01-Home or Self Care     Discharge to home   Diet: Diabetic diet  Special Instructions: No smoking driving or alcohol  Medications at discharge. 1.  Tylenol as needed 2 aspirin 81 mg p.o. daily 3 Colace 100 mg p.o. twice daily 4.  Neurontin 300 mg p.o. nightly 5.  NovoLog Mix 70/30 75 units at breakfast 75 units at supper 6.  Ativan 0.5 mg twice daily as needed 7.  Cozaar 100 mg p.o. daily 8.Amaryl 2 mg daily 9.  Multivitamin daily 10.  Nicorette gum as needed 11.  Oxycodone 5 to 10 mg every 4 hours as needed pain 11.  Protonix 40 mg p.o. daily 12.  Pravachol 40 mg p.o. daily 13.  Zoloft 100 mg p.o. daily 14.  Trazodone 50 mg nightly 15.  Zinc sulfate 220 mg p.o. daily   Discharge Instructions    Ambulatory referral to Physical Medicine Rehab   Complete by:  As directed    Moderate complexity follow-up 1-2 weeks right BKA with history of left AKA      Follow-up Information    Faith RogueSwartz, Zachary  T, MD Follow up.   Specialty:  Physical Medicine and Rehabilitation Why:  as directed Contact information: [address] [place] [address] [phone]        Sherren Kerns, MD Follow up.   Specialties:  Vascular Surgery, Cardiology Why:  call for appointment Contact information: [address] [place] [address] [phone]        [name], MD Follow up.   Specialty:  Family Medicine Why:  Call the office to schedule a tele-visit for hospital follow up  Contact information: [address] [place]  [address] [phone]           Signed: Mcarthur Rossetti Angiulli 02/06/2019, 6:09 AM

## 2019-02-05 NOTE — Progress Notes (Signed)
[place] PHYSICAL MEDICINE & REHABILITATION PROGRESS NOTE   Subjective/Complaints:  No new complaints. Doing well with shrinker. Asked her about her diet and she denies eating things which would raise her sugars  ROS: Patient denies fever, rash, sore throat, blurred vision, nausea, vomiting, diarrhea, cough, shortness of breath or chest pain, joint or back pain, headache, or mood change.     Objective:   No results found. Recent Labs    02/03/19 0550  WBC 8.4  HGB 12.9  HCT 37.2  PLT 188   Recent Labs    02/03/19 0550  NA 130*  K 4.0  CL 94*  CO2 23  GLUCOSE 127*  BUN 19  CREATININE 0.56  CALCIUM 9.2    Intake/Output Summary (Last 24 hours) at 02/05/2019 0938 Last data filed at 02/04/2019 1743 Gross per 24 hour  Intake 340 ml  Output -  Net 340 ml     Physical Exam: Vital Signs Blood pressure (!) 150/72, pulse 68, temperature 98.1 F (36.7 C), temperature source Oral, resp. rate 18, height 5\' 3"  (1.6 m), weight 93.1 kg, SpO2 96 %. Constitutional: No distress . Vital signs reviewed. HEENT: EOMI, oral membranes moist Neck: supple Cardiovascular: RRR without murmur. No JVD    Respiratory: CTA Bilaterally without wheezes or rales. Normal effort    GI: BS +, non-tender, non-distended   Musculoskeletal:  Comments: right BK remains somewhat tender Neurological: alert and  oriented to person, place, and time. Nocranial nerve deficit.Coordinationnormal. Reasonable insight and awareness  Skin: Skin iswarm. AKA well-healed. Right BK clean with staples intact Psychiatric: cooperative   Assessment/Plan: 1. Functional deficits secondary to right BKA which require 3+ hours per day of interdisciplinary therapy in a comprehensive inpatient rehab setting.  Physiatrist is providing close team supervision and 24 hour management of active medical problems listed below.  Physiatrist and rehab team continue to assess barriers to discharge/monitor patient progress  toward functional and medical goals  Care Tool:  Bathing    Body parts bathed by patient: Right arm, Left arm, Chest, Abdomen, Front perineal area, Buttocks, Right upper leg, Left upper leg, Face     Body parts n/a: Right lower leg, Left lower leg   Bathing assist Assist Level: Set up assist(Pt report)     Upper Body Dressing/Undressing Upper body dressing   What is the patient wearing?: Pull over shirt    Upper body assist Assist Level: Set up assist    Lower Body Dressing/Undressing Lower body dressing      What is the patient wearing?: Pants, Incontinence brief     Lower body assist Assist for lower body dressing: Set up assist(Pt report from bed level using hospital bed functions)     Toileting Toileting    Toileting assist Assist for toileting: Moderate Assistance - Patient 50 - 74%     Transfers Chair/bed transfer  Transfers assist     Chair/bed transfer assist level: Supervision/Verbal cueing     Locomotion Ambulation   Ambulation assist   Ambulation activity did not occur: N/A(bilat amputee)          Walk 10 feet activity   Assist  Walk 10 feet activity did not occur: N/A        Walk 50 feet activity   Assist Walk 50 feet with 2 turns activity did not occur: N/A         Walk 150 feet activity   Assist Walk 150 feet activity did not occur: N/A  Walk 10 feet on uneven surface  activity   Assist Walk 10 feet on uneven surfaces activity did not occur: N/A         Wheelchair     Assist Will patient use wheelchair at discharge?: Yes Type of Wheelchair: Manual    Wheelchair assist level: Independent Max wheelchair distance: 100'    Wheelchair 50 feet with 2 turns activity    Assist        Assist Level: Independent   Wheelchair 150 feet activity     Assist Wheelchair 150 feet activity did not occur: Safety/medical concerns   Assist Level: Independent    Medical Problem List and  Plan: 1.Decreased functional mobilitysecondary to right BKA 01/19/2019 as well as history of left AKA 2018 -Continue CIR therapies including PT, OT  -pt tolerating shrinker 2. Antithrombotics: -DVT/anticoagulation:Subcutaneous heparin -antiplatelet therapy: aspirin 81 mg daily    3. Pain Management:Elavil 25 mg daily at bedtime,Neurontin 300 mg daily at bedtime,oxycodone as needed   oxyCR 10mg  qhs helping     -elavil at night stopped---with improvement in sedation -continue massage and desensitization for R BKA  -continue edema control with ACE  -gradually improving 4. Mood:trazodone 50 mg daily at bedtime, Zoloft 100 mg daily, Ativan 0.5 mg twice a day as needed -antipsychotic agents: N/A 5. Neuropsych: This patientiscapable of making decisions on herown behalf. 6. Skin/Wound Care:Routine skin checks  - now in shrinker 7. Fluids/Electrolytes/Nutrition: encourage po 8. CAD. Continue aspirin therapy. No chest pain 9. Diabetes mellitus with peripheral neuropathy. Hemoglobin A1c 8.5.  -cbg's with improvement, remain higher in AM typically still -NovoLog 70/30 70 units at breakfast 60 units at supper   -increased to 75u at dinner 4/26   -increased am 70/30 to 75 -amaryl 2mg  daily to replace metformin (makes her sick to stomach)  -dietary  education    CBG (last 3)  Recent Labs    02/04/19 1631 02/04/19 2107 02/05/19 0658  GLUCAP 209* 87 154*    10. Hypertension. Cozaar 100 mg daily. Monitor with increased mobility Vitals:   02/04/19 1950 02/05/19 0517  BP: (!) 136/58 (!) 150/72  Pulse: 71 68  Resp: 17 18  Temp: 98.7 F (37.1 C) 98.1 F (36.7 C)  SpO2: 94% 96%  fair to borderline control 4/30 11. Tobacco abuse. Provide counseling 12. Pravachol 40 mg daily 13. Constipation. Laxative assistance    LOS: 13 days A FACE TO FACE EVALUATION WAS PERFORMED  Ranelle Oyster 02/05/2019, 9:38 AM

## 2019-02-05 NOTE — Progress Notes (Signed)
Physical Therapy Session Note  Patient Details  Name: Danielle Buchanan MRN: [id number] Date of Birth: 07/21/1956  Today's Date: 02/05/2019 PT Individual Time: 0915-0945 PT Individual Time Calculation (min): 30 min   Short Term Goals: Week 1:  PT Short Term Goal 1 (Week 1): pt will perform functional transfers with min A PT Short Term Goal 1 - Progress (Week 1): Met PT Short Term Goal 2 (Week 1): pt will demo dynamic sitting balance for therapeutic activities with supervision PT Short Term Goal 2 - Progress (Week 1): Met Week 2:  PT Short Term Goal 1 (Week 2): =LTG due to ELOS Week 3:     Skilled Therapeutic Interventions/Progress Updates:     Patient in w/c upon PT arrival. Patient alert and agreeable to PT session. Stated she was incontinent of bladder and needed to get changed at beginning of session.  Therapeutic Activity: Bed Mobility: Patient performed rolling L and R x4 during peri-care and LB dressing performed with supervision for cuing. She performed supine to/from sit with supervision with hospital bed accessories, patient reports that she will have a hospital bed at home. Provided verbal cues for rolling technique during LB dressing. With patient lying supine, PT donned a Tefla dressing and clean black compression sock to L residual limb.  Transfers: Patient performed slide board transfers to/form the bed and the w/c with supervision for safety and min A for board placement x1. Provided verbal cues for board placement and hand position for scooting.  Wheelchair Mobility:  Patient propelled wheelchair 10 feet in the room  with mod I.  Patient in w/c at end of session with breaks locked and all needs within reach.    Therapy Documentation Precautions:  Precautions Precautions: Fall Precaution Comments: now bilateral amputee with change to CoG causing increased posterior lean Restrictions Weight Bearing Restrictions: Yes RLE Weight Bearing: Non weight bearing LLE Weight  Bearing: Weight bearing as tolerated Pain: Pain Assessment Pain Score: 0-No pain   Therapy/Group: Individual Therapy   L  PT, DPT  02/05/2019, 12:32 PM  

## 2019-02-06 ENCOUNTER — Inpatient Hospital Stay (HOSPITAL_COMMUNITY): Payer: Medicare HMO | Admitting: Physical Therapy

## 2019-02-06 LAB — GLUCOSE, CAPILLARY: Glucose-Capillary: 126 mg/dL — ABNORMAL HIGH (ref 70–99)

## 2019-02-06 MED ORDER — SERTRALINE HCL 100 MG PO TABS: 100.0000 mg | ORAL_TABLET | Freq: Every day | ORAL | 0 refills | Status: AC

## 2019-02-06 MED ORDER — GABAPENTIN 300 MG PO CAPS: 300.0000 mg | ORAL_CAPSULE | Freq: Every day | ORAL | 0 refills | Status: AC

## 2019-02-06 MED ORDER — ACETAMINOPHEN 325 MG PO TABS: 325.0000 mg | ORAL_TABLET | ORAL | Status: AC | PRN

## 2019-02-06 MED ORDER — INSULIN ASPART PROT & ASPART (70-30 MIX) 100 UNIT/ML PEN: 75.0000 [IU] | PEN_INJECTOR | Freq: Two times a day (BID) | SUBCUTANEOUS | 11 refills | Status: AC

## 2019-02-06 MED ORDER — PRAVASTATIN SODIUM 40 MG PO TABS: 40.0000 mg | ORAL_TABLET | Freq: Every day | ORAL | 0 refills | Status: AC

## 2019-02-06 MED ORDER — GLIMEPIRIDE 2 MG PO TABS: 2.0000 mg | ORAL_TABLET | Freq: Every day | ORAL | 1 refills | Status: AC

## 2019-02-06 MED ORDER — TRAZODONE HCL 50 MG PO TABS: 50.0000 mg | ORAL_TABLET | Freq: Every day | ORAL | 0 refills | Status: AC

## 2019-02-06 MED ORDER — OXYCODONE HCL ER 10 MG PO T12A: 10.0000 mg | EXTENDED_RELEASE_TABLET | Freq: Every day | ORAL | 0 refills | Status: DC

## 2019-02-06 MED ORDER — PANTOPRAZOLE SODIUM 40 MG PO TBEC: 40.0000 mg | DELAYED_RELEASE_TABLET | Freq: Every day | ORAL | 0 refills | Status: AC

## 2019-02-06 MED ORDER — LORAZEPAM 0.5 MG PO TABS: 0.5000 mg | ORAL_TABLET | Freq: Two times a day (BID) | ORAL | 0 refills | Status: AC | PRN

## 2019-02-06 MED ORDER — LOSARTAN POTASSIUM 100 MG PO TABS: 100.0000 mg | ORAL_TABLET | Freq: Every day | ORAL | 0 refills | Status: AC

## 2019-02-06 MED ORDER — TRUE METRIX BLOOD GLUCOSE TEST VI STRP: ORAL_STRIP | 12 refills | Status: AC

## 2019-02-06 MED ORDER — OXYCODONE HCL 5 MG PO TABS: 5.0000 mg | ORAL_TABLET | ORAL | 0 refills | Status: AC | PRN

## 2019-02-06 MED ORDER — ASPIRIN 81 MG PO TBEC: 81.0000 mg | DELAYED_RELEASE_TABLET | Freq: Every day | ORAL | 0 refills | Status: AC

## 2019-02-06 NOTE — Progress Notes (Signed)
Physical Therapy Session Note  Patient Details  Name: Danielle Buchanan MRN: [id number] Date of Birth: [birth year]  Today's Date: 02/06/2019 PT Individual Time: 1130-1145 PT Individual Time Calculation (min): 15 min   Short Term Goals: Week 2:  PT Short Term Goal 1 (Week 2): =LTG due to ELOS  Skilled Therapeutic Interventions/Progress Updates:    Pt received seated in bed, family here to take her home. Slide board transfer bed to w/c with Supervision. Transport from patient room to front doors via w/c dependently for time conservation. Slide board transfer w/c to car with min A with cueing for head/hips relationship. Pt's family able to observe transfer and provided education about how to safely transfer from car to w/c once they are home. Pt to d/c home at this time.  Therapy Documentation Precautions:  Precautions Precautions: Fall Precaution Comments: now bilateral amputee with change to CoG causing increased posterior lean Restrictions Weight Bearing Restrictions: Yes RLE Weight Bearing: Non weight bearing LLE Weight Bearing: Weight bearing as tolerated Pain: Pain Assessment Pain Scale: 0-10 Pain Score: 0-No pain    Therapy/Group: Individual Therapy   Peter Congo, PT, DPT  02/06/2019, 11:51 AM

## 2019-02-06 NOTE — Progress Notes (Signed)
Social Work  Discharge Note  The overall goal for the admission was met for:   Discharge location: Yes - pt returning to her apt with niece and her husband to share in providing 24/7 assistance.  Family education with niece completed yesterday.  Length of Stay: Yes - 14 days  Discharge activity level: Yes - supervision overall  Home/community participation: Yes  Services provided included: MD, RD, PT, OT, RN, TR, Pharmacy and SW  Financial Services: Medicare  Follow-up services arranged: Home Health: RN, PT, OT via [facility], DME: right amputee support pad for existing w/c, semi-electric hospital bed via [facility]. and Patient/Family has no preference for HH/DME agencies  Comments (or additional information):   Contact info:  Pt @ [phone]      Dow Adolph @ [phone]  Patient/Family verbalized understanding of follow-up arrangements: Yes  Individual responsible for coordination of the follow-up plan: pt  Confirmed correct DME delivered: Lennart Pall 02/06/2019    Ramonita Koenig

## 2019-02-06 NOTE — Progress Notes (Signed)
[place] PHYSICAL MEDICINE & REHABILITATION PROGRESS NOTE   Subjective/Complaints:  Up in bed. No new issues. Tolerated amaryl yesterday without any problems. Ready for discharge  ROS: Patient denies fever, rash, sore throat, blurred vision, nausea, vomiting, diarrhea, cough, shortness of breath or chest pain, back pain, headache, or mood change. .     Objective:   No results found. No results for input(s): WBC, HGB, HCT, PLT in the last 72 hours. No results for input(s): NA, K, CL, CO2, GLUCOSE, BUN, CREATININE, CALCIUM in the last 72 hours.  Intake/Output Summary (Last 24 hours) at 02/06/2019 1030 Last data filed at 02/06/2019 0742 Gross per 24 hour  Intake 720 ml  Output -  Net 720 ml     Physical Exam: Vital Signs Blood pressure (!) 146/75, pulse 65, temperature 97.7 F (36.5 C), temperature source Oral, resp. rate 17, height 5\' 3"  (1.6 m), weight 93.1 kg, SpO2 95 %. Constitutional: No distress . Vital signs reviewed. HEENT: EOMI, oral membranes moist Neck: supple Cardiovascular: RRR without murmur. No JVD    Respiratory: CTA Bilaterally without wheezes or rales. Normal effort    GI: BS +, non-tender, non-distended  Musculoskeletal:  Comments: right BK remains somewhat tender to touch Neurological: alert and  oriented to person, place, and time. Nocranial nerve deficit.Coordinationnormal. Reasonable insight and awareness  Skin: Skin iswarm. AKA well-healed. Right BK clean with incision intact Psychiatric: cooperative   Assessment/Plan: 1. Functional deficits secondary to right BKA which require 3+ hours per day of interdisciplinary therapy in a comprehensive inpatient rehab setting.  Physiatrist is providing close team supervision and 24 hour management of active medical problems listed below.  Physiatrist and rehab team continue to assess barriers to discharge/monitor patient progress toward functional and medical goals  Care Tool:  Bathing    Body  parts bathed by patient: Right arm, Left arm, Chest, Abdomen, Front perineal area, Buttocks, Right upper leg, Left upper leg, Face     Body parts n/a: Right lower leg, Left lower leg   Bathing assist Assist Level: Set up assist     Upper Body Dressing/Undressing Upper body dressing   What is the patient wearing?: Pull over shirt    Upper body assist Assist Level: Independent    Lower Body Dressing/Undressing Lower body dressing      What is the patient wearing?: Pants, Incontinence brief     Lower body assist Assist for lower body dressing: Set up assist(pt report from bed level)     Toileting Toileting    Toileting assist Assist for toileting: Supervision/Verbal cueing     Transfers Chair/bed transfer  Transfers assist     Chair/bed transfer assist level: Supervision/Verbal cueing     Locomotion Ambulation   Ambulation assist   Ambulation activity did not occur: Safety/medical concerns          Walk 10 feet activity   Assist  Walk 10 feet activity did not occur: Safety/medical concerns        Walk 50 feet activity   Assist Walk 50 feet with 2 turns activity did not occur: Safety/medical concerns         Walk 150 feet activity   Assist Walk 150 feet activity did not occur: Safety/medical concerns         Walk 10 feet on uneven surface  activity   Assist Walk 10 feet on uneven surfaces activity did not occur: Safety/medical concerns         Wheelchair  Assist Will patient use wheelchair at discharge?: Yes Type of Wheelchair: Manual    Wheelchair assist level: Independent Max wheelchair distance: 150'    Wheelchair 50 feet with 2 turns activity    Assist        Assist Level: Independent   Wheelchair 150 feet activity     Assist Wheelchair 150 feet activity did not occur: Safety/medical concerns   Assist Level: Independent    Medical Problem List and Plan: 1.Decreased functional  mobilitysecondary to right BKA 01/19/2019 as well as history of left AKA 2018 -dc home today  -Patient to see Rehab MD/provider in the office for transitional care encounter in 2-4 weeks.   -surgical follow up  2. Antithrombotics: -DVT/anticoagulation:Subcutaneous heparin -antiplatelet therapy: aspirin 81 mg daily    3. Pain Management:Elavil 25 mg daily at bedtime,Neurontin 300 mg daily at bedtime,oxycodone as needed   oxyCR 10mg  qhs helping     -elavil at night stopped---with improvement in sedation -continue massage and desensitization for R BKA  -continue edema control with ACE  -gradually improvement 4. Mood:trazodone 50 mg daily at bedtime, Zoloft 100 mg daily, Ativan 0.5 mg twice a day as needed -antipsychotic agents: N/A 5. Neuropsych: This patientiscapable of making decisions on herown behalf. 6. Skin/Wound Care:Routine skin checks  - now in shrinker 7. Fluids/Electrolytes/Nutrition: encourage po 8. CAD. Continue aspirin therapy. No chest pain 9. Diabetes mellitus with peripheral neuropathy. Hemoglobin A1c 8.5.  -cbg's with improvement, remain higher in AM typically still -NovoLog 70/30 70 units at breakfast 60 units at supper   -increased to 75u at dinner 4/26   -increased am 70/30 to 75   -wean dosing if amaryl improves cbg's significantly -amaryl 2mg  daily to replace metformin with immediate improvement of CBG's.  -dietary  education    CBG (last 3)  Recent Labs    02/05/19 1650 02/05/19 2133 02/06/19 0642  GLUCAP 109* 114* 126*    10. Hypertension. Cozaar 100 mg daily. Monitor with increased mobility Vitals:   02/05/19 2038 02/06/19 0512  BP: (!) 145/67 (!) 146/75  Pulse: 67 65  Resp: 19 17  Temp: 98.3 F (36.8 C) 97.7 F (36.5 C)  SpO2: 98% 95%  fair to borderline control 5/1 11. Tobacco abuse. Provide counseling 12. Pravachol 40 mg daily 13.  Constipation. Laxative assistance    LOS: 14 days A FACE TO FACE EVALUATION WAS PERFORMED  Ranelle OysterZachary T Marcheta Horsey 02/06/2019, 10:30 AM

## 2019-02-06 NOTE — Progress Notes (Signed)
Patient given discharge instructions by Jesusita Oka, [place]. All questions were answered.  Patient was wheeled down in wheel chair with all personal belongings by PT and nurse.  Lorri Frederick, LPN

## 2019-02-08 DIAGNOSIS — Z4800 Encounter for change or removal of nonsurgical wound dressing: Secondary | ICD-10-CM | POA: Diagnosis not present

## 2019-02-08 DIAGNOSIS — E1151 Type 2 diabetes mellitus with diabetic peripheral angiopathy without gangrene: Secondary | ICD-10-CM | POA: Diagnosis not present

## 2019-02-08 DIAGNOSIS — I70209 Unspecified atherosclerosis of native arteries of extremities, unspecified extremity: Secondary | ICD-10-CM | POA: Diagnosis not present

## 2019-02-08 DIAGNOSIS — Z89421 Acquired absence of other right toe(s): Secondary | ICD-10-CM | POA: Diagnosis not present

## 2019-02-08 DIAGNOSIS — Z89411 Acquired absence of right great toe: Secondary | ICD-10-CM | POA: Diagnosis not present

## 2019-02-08 DIAGNOSIS — Z4781 Encounter for orthopedic aftercare following surgical amputation: Secondary | ICD-10-CM | POA: Diagnosis not present

## 2019-02-08 DIAGNOSIS — Z7984 Long term (current) use of oral hypoglycemic drugs: Secondary | ICD-10-CM | POA: Diagnosis not present

## 2019-02-08 DIAGNOSIS — Z89612 Acquired absence of left leg above knee: Secondary | ICD-10-CM | POA: Diagnosis not present

## 2019-02-08 DIAGNOSIS — I10 Essential (primary) hypertension: Secondary | ICD-10-CM | POA: Diagnosis not present

## 2019-02-09 DIAGNOSIS — Z89612 Acquired absence of left leg above knee: Secondary | ICD-10-CM | POA: Diagnosis not present

## 2019-02-09 DIAGNOSIS — Z89421 Acquired absence of other right toe(s): Secondary | ICD-10-CM | POA: Diagnosis not present

## 2019-02-09 DIAGNOSIS — Z4781 Encounter for orthopedic aftercare following surgical amputation: Secondary | ICD-10-CM | POA: Diagnosis not present

## 2019-02-09 DIAGNOSIS — Z89511 Acquired absence of right leg below knee: Secondary | ICD-10-CM | POA: Diagnosis not present

## 2019-02-09 DIAGNOSIS — E1151 Type 2 diabetes mellitus with diabetic peripheral angiopathy without gangrene: Secondary | ICD-10-CM | POA: Diagnosis not present

## 2019-02-09 DIAGNOSIS — Z4800 Encounter for change or removal of nonsurgical wound dressing: Secondary | ICD-10-CM | POA: Diagnosis not present

## 2019-02-09 DIAGNOSIS — I70209 Unspecified atherosclerosis of native arteries of extremities, unspecified extremity: Secondary | ICD-10-CM | POA: Diagnosis not present

## 2019-02-09 DIAGNOSIS — Z89411 Acquired absence of right great toe: Secondary | ICD-10-CM | POA: Diagnosis not present

## 2019-02-09 DIAGNOSIS — I10 Essential (primary) hypertension: Secondary | ICD-10-CM | POA: Diagnosis not present

## 2019-02-09 DIAGNOSIS — Z7984 Long term (current) use of oral hypoglycemic drugs: Secondary | ICD-10-CM | POA: Diagnosis not present

## 2019-02-10 ENCOUNTER — Telehealth: Payer: Self-pay | Admitting: *Deleted

## 2019-02-10 DIAGNOSIS — Z4800 Encounter for change or removal of nonsurgical wound dressing: Secondary | ICD-10-CM | POA: Diagnosis not present

## 2019-02-10 DIAGNOSIS — I10 Essential (primary) hypertension: Secondary | ICD-10-CM | POA: Diagnosis not present

## 2019-02-10 DIAGNOSIS — I70209 Unspecified atherosclerosis of native arteries of extremities, unspecified extremity: Secondary | ICD-10-CM | POA: Diagnosis not present

## 2019-02-10 DIAGNOSIS — Z89421 Acquired absence of other right toe(s): Secondary | ICD-10-CM | POA: Diagnosis not present

## 2019-02-10 DIAGNOSIS — Z89612 Acquired absence of left leg above knee: Secondary | ICD-10-CM | POA: Diagnosis not present

## 2019-02-10 DIAGNOSIS — Z7984 Long term (current) use of oral hypoglycemic drugs: Secondary | ICD-10-CM | POA: Diagnosis not present

## 2019-02-10 DIAGNOSIS — Z89411 Acquired absence of right great toe: Secondary | ICD-10-CM | POA: Diagnosis not present

## 2019-02-10 DIAGNOSIS — E1151 Type 2 diabetes mellitus with diabetic peripheral angiopathy without gangrene: Secondary | ICD-10-CM | POA: Diagnosis not present

## 2019-02-10 DIAGNOSIS — Z4781 Encounter for orthopedic aftercare following surgical amputation: Secondary | ICD-10-CM | POA: Diagnosis not present

## 2019-02-10 NOTE — Telephone Encounter (Signed)
I spoke with [name] about return appointment to office after discharge from inpt rehab. She is adamant she will not be coming to the appointment as she did not like Dr Riley Kill. They will be following up with Dr Darrick Penna and her primary care Dr Hyman Hopes.  I informed them (her and her nephew) that they must talk with Dr Darrick Penna and Dr Hyman Hopes about home health therapy and management of her stump because we will not be able to sign off on orders for [facility] therapist and prosthetic management. They were in agreement.

## 2019-02-12 DIAGNOSIS — Z89421 Acquired absence of other right toe(s): Secondary | ICD-10-CM | POA: Diagnosis not present

## 2019-02-12 DIAGNOSIS — Z89411 Acquired absence of right great toe: Secondary | ICD-10-CM | POA: Diagnosis not present

## 2019-02-12 DIAGNOSIS — I70209 Unspecified atherosclerosis of native arteries of extremities, unspecified extremity: Secondary | ICD-10-CM | POA: Diagnosis not present

## 2019-02-12 DIAGNOSIS — E1151 Type 2 diabetes mellitus with diabetic peripheral angiopathy without gangrene: Secondary | ICD-10-CM | POA: Diagnosis not present

## 2019-02-12 DIAGNOSIS — Z7984 Long term (current) use of oral hypoglycemic drugs: Secondary | ICD-10-CM | POA: Diagnosis not present

## 2019-02-12 DIAGNOSIS — Z89612 Acquired absence of left leg above knee: Secondary | ICD-10-CM | POA: Diagnosis not present

## 2019-02-12 DIAGNOSIS — Z4781 Encounter for orthopedic aftercare following surgical amputation: Secondary | ICD-10-CM | POA: Diagnosis not present

## 2019-02-12 DIAGNOSIS — I10 Essential (primary) hypertension: Secondary | ICD-10-CM | POA: Diagnosis not present

## 2019-02-12 DIAGNOSIS — Z4800 Encounter for change or removal of nonsurgical wound dressing: Secondary | ICD-10-CM | POA: Diagnosis not present

## 2019-02-16 DIAGNOSIS — Z4781 Encounter for orthopedic aftercare following surgical amputation: Secondary | ICD-10-CM | POA: Diagnosis not present

## 2019-02-16 DIAGNOSIS — F17210 Nicotine dependence, cigarettes, uncomplicated: Secondary | ICD-10-CM | POA: Diagnosis not present

## 2019-02-16 DIAGNOSIS — I2510 Atherosclerotic heart disease of native coronary artery without angina pectoris: Secondary | ICD-10-CM | POA: Diagnosis not present

## 2019-02-16 DIAGNOSIS — I70209 Unspecified atherosclerosis of native arteries of extremities, unspecified extremity: Secondary | ICD-10-CM | POA: Diagnosis not present

## 2019-02-16 DIAGNOSIS — I10 Essential (primary) hypertension: Secondary | ICD-10-CM | POA: Diagnosis not present

## 2019-02-16 DIAGNOSIS — E1151 Type 2 diabetes mellitus with diabetic peripheral angiopathy without gangrene: Secondary | ICD-10-CM | POA: Diagnosis not present

## 2019-02-16 DIAGNOSIS — Z89511 Acquired absence of right leg below knee: Secondary | ICD-10-CM | POA: Diagnosis not present

## 2019-02-16 DIAGNOSIS — Z89612 Acquired absence of left leg above knee: Secondary | ICD-10-CM | POA: Diagnosis not present

## 2019-02-16 DIAGNOSIS — E1142 Type 2 diabetes mellitus with diabetic polyneuropathy: Secondary | ICD-10-CM | POA: Diagnosis not present

## 2019-02-17 ENCOUNTER — Encounter: Payer: Medicare HMO | Admitting: Physical Medicine & Rehabilitation

## 2019-02-17 DIAGNOSIS — Z4781 Encounter for orthopedic aftercare following surgical amputation: Secondary | ICD-10-CM | POA: Diagnosis not present

## 2019-02-17 DIAGNOSIS — Z89511 Acquired absence of right leg below knee: Secondary | ICD-10-CM | POA: Diagnosis not present

## 2019-02-17 DIAGNOSIS — I10 Essential (primary) hypertension: Secondary | ICD-10-CM | POA: Diagnosis not present

## 2019-02-17 DIAGNOSIS — E1142 Type 2 diabetes mellitus with diabetic polyneuropathy: Secondary | ICD-10-CM | POA: Diagnosis not present

## 2019-02-17 DIAGNOSIS — I2510 Atherosclerotic heart disease of native coronary artery without angina pectoris: Secondary | ICD-10-CM | POA: Diagnosis not present

## 2019-02-17 DIAGNOSIS — F17210 Nicotine dependence, cigarettes, uncomplicated: Secondary | ICD-10-CM | POA: Diagnosis not present

## 2019-02-17 DIAGNOSIS — E1151 Type 2 diabetes mellitus with diabetic peripheral angiopathy without gangrene: Secondary | ICD-10-CM | POA: Diagnosis not present

## 2019-02-17 DIAGNOSIS — Z89612 Acquired absence of left leg above knee: Secondary | ICD-10-CM | POA: Diagnosis not present

## 2019-02-17 DIAGNOSIS — I70209 Unspecified atherosclerosis of native arteries of extremities, unspecified extremity: Secondary | ICD-10-CM | POA: Diagnosis not present

## 2019-02-18 ENCOUNTER — Telehealth (HOSPITAL_COMMUNITY): Payer: Self-pay | Admitting: Rehabilitation

## 2019-02-18 NOTE — Telephone Encounter (Signed)
The above patient or their representative was contacted and gave the following answers to these questions:         Do you have any of the following symptoms? No  Fever                    Cough                   Shortness of breath  Do  you have any of the following other symptoms? No   muscle pain         vomiting,        diarrhea        rash         weakness        red eye        abdominal pain         bruising          bruising or bleeding              joint pain           severe headache    Have you been in contact with someone who was or has been sick in the past 2 weeks? No  Yes                 Unsure                         Unable to assess   Does the person that you were in contact with have any of the following symptoms?   Cough         shortness of breath           muscle pain         vomiting,            diarrhea            rash            weakness           fever            red eye           abdominal pain           bruising  or  bleeding                joint pain                severe headache               Have you  or someone you have been in contact with traveled internationally in th last month? No        If yes, which countries?   Have you  or someone you have been in contact with traveled outside [place] in th last month? No         If yes, which state and city?   COMMENTS OR ACTION PLAN FOR THIS PATIENT:          

## 2019-02-19 ENCOUNTER — Encounter: Payer: Self-pay | Admitting: Vascular Surgery

## 2019-02-19 ENCOUNTER — Other Ambulatory Visit: Payer: Self-pay

## 2019-02-19 ENCOUNTER — Ambulatory Visit (INDEPENDENT_AMBULATORY_CARE_PROVIDER_SITE_OTHER): Payer: Medicare HMO | Admitting: Vascular Surgery

## 2019-02-19 VITALS — BP 126/72 | HR 74 | Temp 97.3°F | Resp 18 | Ht 63.0 in | Wt 205.0 lb

## 2019-02-19 DIAGNOSIS — I739 Peripheral vascular disease, unspecified: Secondary | ICD-10-CM

## 2019-02-19 DIAGNOSIS — I70209 Unspecified atherosclerosis of native arteries of extremities, unspecified extremity: Secondary | ICD-10-CM | POA: Diagnosis not present

## 2019-02-19 DIAGNOSIS — F17210 Nicotine dependence, cigarettes, uncomplicated: Secondary | ICD-10-CM | POA: Diagnosis not present

## 2019-02-19 DIAGNOSIS — E1142 Type 2 diabetes mellitus with diabetic polyneuropathy: Secondary | ICD-10-CM | POA: Diagnosis not present

## 2019-02-19 DIAGNOSIS — I2510 Atherosclerotic heart disease of native coronary artery without angina pectoris: Secondary | ICD-10-CM | POA: Diagnosis not present

## 2019-02-19 DIAGNOSIS — E1151 Type 2 diabetes mellitus with diabetic peripheral angiopathy without gangrene: Secondary | ICD-10-CM | POA: Diagnosis not present

## 2019-02-19 DIAGNOSIS — Z89612 Acquired absence of left leg above knee: Secondary | ICD-10-CM | POA: Diagnosis not present

## 2019-02-19 DIAGNOSIS — Z89511 Acquired absence of right leg below knee: Secondary | ICD-10-CM | POA: Diagnosis not present

## 2019-02-19 DIAGNOSIS — Z4781 Encounter for orthopedic aftercare following surgical amputation: Secondary | ICD-10-CM | POA: Diagnosis not present

## 2019-02-19 DIAGNOSIS — I10 Essential (primary) hypertension: Secondary | ICD-10-CM | POA: Diagnosis not present

## 2019-02-19 NOTE — Progress Notes (Addendum)
Patient is a 63 year old female who returns for postoperative follow-up today.  She underwent right below-knee amputation on January 19, 2019.  She reports a small amount of incisional drainage from the left medial aspect.  He denies fever or chills.  She had previously undergone left below-knee amputation by [name] October 2018.  She subsequently had a left above-knee amputation by [name] November 2018.  She had a angioplasty of her right posterior tibial artery by [name] January 2019.  She then underwent right fourth toe amputation January 2019.  This was followed by an angioplasty of her right tibioperoneal trunk and peroneal artery by Dr. Myra Gianotti February 2020.  She then had a right transmetatarsal amputation in February 2020.  Physical exam:  Vitals:   02/19/19 0853  BP: 126/72  Pulse: 74  Resp: 18  Temp: (!) 97.3 F (36.3 C)  SpO2: 98%  Weight: 205 lb (93 kg)  Height: 5\' 3"  (1.6 m)    Right lower extremity: Healing right below-knee amputation with some maceration of the skin over a 2 cm segment on the medial aspect of the left below-knee amputation.  No significant drainage.  Staples were removed today.  Assessment: Slowly healing right below-knee amputation  Plan: Staples were removed today shrinker was prescribed patient will follow-up on an as-needed basis if she has any further breakdown of the right below-knee amputation.  I believe her right transtibial amputation require shrinkers and a limb guard to protect and shape her residual limb for possible future prosthetic treatment.  [name], MD [facility] [place] Office: [phone] Pager: [phone]

## 2019-02-20 DIAGNOSIS — I70209 Unspecified atherosclerosis of native arteries of extremities, unspecified extremity: Secondary | ICD-10-CM | POA: Diagnosis not present

## 2019-02-20 DIAGNOSIS — E1151 Type 2 diabetes mellitus with diabetic peripheral angiopathy without gangrene: Secondary | ICD-10-CM | POA: Diagnosis not present

## 2019-02-20 DIAGNOSIS — Z89612 Acquired absence of left leg above knee: Secondary | ICD-10-CM | POA: Diagnosis not present

## 2019-02-20 DIAGNOSIS — F17210 Nicotine dependence, cigarettes, uncomplicated: Secondary | ICD-10-CM | POA: Diagnosis not present

## 2019-02-20 DIAGNOSIS — I2510 Atherosclerotic heart disease of native coronary artery without angina pectoris: Secondary | ICD-10-CM | POA: Diagnosis not present

## 2019-02-20 DIAGNOSIS — I10 Essential (primary) hypertension: Secondary | ICD-10-CM | POA: Diagnosis not present

## 2019-02-20 DIAGNOSIS — E1142 Type 2 diabetes mellitus with diabetic polyneuropathy: Secondary | ICD-10-CM | POA: Diagnosis not present

## 2019-02-20 DIAGNOSIS — Z89511 Acquired absence of right leg below knee: Secondary | ICD-10-CM | POA: Diagnosis not present

## 2019-02-20 DIAGNOSIS — Z4781 Encounter for orthopedic aftercare following surgical amputation: Secondary | ICD-10-CM | POA: Diagnosis not present

## 2019-02-23 ENCOUNTER — Other Ambulatory Visit: Payer: Medicare HMO

## 2019-02-23 DIAGNOSIS — Z89511 Acquired absence of right leg below knee: Secondary | ICD-10-CM | POA: Diagnosis not present

## 2019-02-23 DIAGNOSIS — I10 Essential (primary) hypertension: Secondary | ICD-10-CM | POA: Diagnosis not present

## 2019-02-23 DIAGNOSIS — Z89612 Acquired absence of left leg above knee: Secondary | ICD-10-CM | POA: Diagnosis not present

## 2019-02-23 DIAGNOSIS — Z4781 Encounter for orthopedic aftercare following surgical amputation: Secondary | ICD-10-CM | POA: Diagnosis not present

## 2019-02-23 DIAGNOSIS — I2510 Atherosclerotic heart disease of native coronary artery without angina pectoris: Secondary | ICD-10-CM | POA: Diagnosis not present

## 2019-02-23 DIAGNOSIS — E1151 Type 2 diabetes mellitus with diabetic peripheral angiopathy without gangrene: Secondary | ICD-10-CM | POA: Diagnosis not present

## 2019-02-23 DIAGNOSIS — E1142 Type 2 diabetes mellitus with diabetic polyneuropathy: Secondary | ICD-10-CM | POA: Diagnosis not present

## 2019-02-23 DIAGNOSIS — F17210 Nicotine dependence, cigarettes, uncomplicated: Secondary | ICD-10-CM | POA: Diagnosis not present

## 2019-02-23 DIAGNOSIS — I70209 Unspecified atherosclerosis of native arteries of extremities, unspecified extremity: Secondary | ICD-10-CM | POA: Diagnosis not present

## 2019-02-24 DIAGNOSIS — I10 Essential (primary) hypertension: Secondary | ICD-10-CM | POA: Diagnosis not present

## 2019-02-24 DIAGNOSIS — E1151 Type 2 diabetes mellitus with diabetic peripheral angiopathy without gangrene: Secondary | ICD-10-CM | POA: Diagnosis not present

## 2019-02-24 DIAGNOSIS — I70209 Unspecified atherosclerosis of native arteries of extremities, unspecified extremity: Secondary | ICD-10-CM | POA: Diagnosis not present

## 2019-02-24 DIAGNOSIS — Z89612 Acquired absence of left leg above knee: Secondary | ICD-10-CM | POA: Diagnosis not present

## 2019-02-24 DIAGNOSIS — I2510 Atherosclerotic heart disease of native coronary artery without angina pectoris: Secondary | ICD-10-CM | POA: Diagnosis not present

## 2019-02-24 DIAGNOSIS — E1142 Type 2 diabetes mellitus with diabetic polyneuropathy: Secondary | ICD-10-CM | POA: Diagnosis not present

## 2019-02-24 DIAGNOSIS — F17210 Nicotine dependence, cigarettes, uncomplicated: Secondary | ICD-10-CM | POA: Diagnosis not present

## 2019-02-24 DIAGNOSIS — Z89511 Acquired absence of right leg below knee: Secondary | ICD-10-CM | POA: Diagnosis not present

## 2019-02-24 DIAGNOSIS — Z4781 Encounter for orthopedic aftercare following surgical amputation: Secondary | ICD-10-CM | POA: Diagnosis not present

## 2019-02-26 ENCOUNTER — Telehealth: Payer: Self-pay

## 2019-02-26 DIAGNOSIS — I70209 Unspecified atherosclerosis of native arteries of extremities, unspecified extremity: Secondary | ICD-10-CM | POA: Diagnosis not present

## 2019-02-26 DIAGNOSIS — F17210 Nicotine dependence, cigarettes, uncomplicated: Secondary | ICD-10-CM | POA: Diagnosis not present

## 2019-02-26 DIAGNOSIS — I2510 Atherosclerotic heart disease of native coronary artery without angina pectoris: Secondary | ICD-10-CM | POA: Diagnosis not present

## 2019-02-26 DIAGNOSIS — I10 Essential (primary) hypertension: Secondary | ICD-10-CM | POA: Diagnosis not present

## 2019-02-26 DIAGNOSIS — Z89511 Acquired absence of right leg below knee: Secondary | ICD-10-CM | POA: Diagnosis not present

## 2019-02-26 DIAGNOSIS — E1142 Type 2 diabetes mellitus with diabetic polyneuropathy: Secondary | ICD-10-CM | POA: Diagnosis not present

## 2019-02-26 DIAGNOSIS — Z4781 Encounter for orthopedic aftercare following surgical amputation: Secondary | ICD-10-CM | POA: Diagnosis not present

## 2019-02-26 DIAGNOSIS — E1151 Type 2 diabetes mellitus with diabetic peripheral angiopathy without gangrene: Secondary | ICD-10-CM | POA: Diagnosis not present

## 2019-02-26 DIAGNOSIS — Z89612 Acquired absence of left leg above knee: Secondary | ICD-10-CM | POA: Diagnosis not present

## 2019-02-26 NOTE — Telephone Encounter (Signed)
Pt called and asked if she could get her sutures wet. She said that she is bleeding from her incision site.    Called pt back and discussed with her that I dont see that she has stitches in and she was here on the 14th to have her staples removed. She said that she was confused and she was just asking if the area where she had the staples could be cleaned.   Advised her that she could clean the area gently with a mild soap and told her that she needs to keep the area dry and elevate it above her heart when she is seated.   She then asked for a refill on her Tramadol. Advised that I did not see this medication on her list. She said that Dr Myra Gianotti had given her a script at some point. Discussed with her that she is on Oxycontin and Oxycodone and does not need to take multiple forms of narcotics. She asked if we could refill those - advised her that she needed to contact her prescribing physician.   She denies any issues with the surgical site other than a small amount of oozing.,   Julious Payer, CMA

## 2019-03-03 DIAGNOSIS — I10 Essential (primary) hypertension: Secondary | ICD-10-CM | POA: Diagnosis not present

## 2019-03-03 DIAGNOSIS — E1142 Type 2 diabetes mellitus with diabetic polyneuropathy: Secondary | ICD-10-CM | POA: Diagnosis not present

## 2019-03-03 DIAGNOSIS — F17210 Nicotine dependence, cigarettes, uncomplicated: Secondary | ICD-10-CM | POA: Diagnosis not present

## 2019-03-03 DIAGNOSIS — Z89612 Acquired absence of left leg above knee: Secondary | ICD-10-CM | POA: Diagnosis not present

## 2019-03-03 DIAGNOSIS — I70209 Unspecified atherosclerosis of native arteries of extremities, unspecified extremity: Secondary | ICD-10-CM | POA: Diagnosis not present

## 2019-03-03 DIAGNOSIS — E1151 Type 2 diabetes mellitus with diabetic peripheral angiopathy without gangrene: Secondary | ICD-10-CM | POA: Diagnosis not present

## 2019-03-03 DIAGNOSIS — Z89511 Acquired absence of right leg below knee: Secondary | ICD-10-CM | POA: Diagnosis not present

## 2019-03-03 DIAGNOSIS — Z4781 Encounter for orthopedic aftercare following surgical amputation: Secondary | ICD-10-CM | POA: Diagnosis not present

## 2019-03-03 DIAGNOSIS — I2510 Atherosclerotic heart disease of native coronary artery without angina pectoris: Secondary | ICD-10-CM | POA: Diagnosis not present

## 2019-03-04 DIAGNOSIS — Z4781 Encounter for orthopedic aftercare following surgical amputation: Secondary | ICD-10-CM | POA: Diagnosis not present

## 2019-03-04 DIAGNOSIS — Z89612 Acquired absence of left leg above knee: Secondary | ICD-10-CM | POA: Diagnosis not present

## 2019-03-04 DIAGNOSIS — E1142 Type 2 diabetes mellitus with diabetic polyneuropathy: Secondary | ICD-10-CM | POA: Diagnosis not present

## 2019-03-04 DIAGNOSIS — I10 Essential (primary) hypertension: Secondary | ICD-10-CM | POA: Diagnosis not present

## 2019-03-04 DIAGNOSIS — I70209 Unspecified atherosclerosis of native arteries of extremities, unspecified extremity: Secondary | ICD-10-CM | POA: Diagnosis not present

## 2019-03-04 DIAGNOSIS — Z89511 Acquired absence of right leg below knee: Secondary | ICD-10-CM | POA: Diagnosis not present

## 2019-03-04 DIAGNOSIS — F17210 Nicotine dependence, cigarettes, uncomplicated: Secondary | ICD-10-CM | POA: Diagnosis not present

## 2019-03-04 DIAGNOSIS — E1151 Type 2 diabetes mellitus with diabetic peripheral angiopathy without gangrene: Secondary | ICD-10-CM | POA: Diagnosis not present

## 2019-03-04 DIAGNOSIS — I2510 Atherosclerotic heart disease of native coronary artery without angina pectoris: Secondary | ICD-10-CM | POA: Diagnosis not present

## 2019-03-06 ENCOUNTER — Telehealth: Payer: Self-pay

## 2019-03-06 DIAGNOSIS — Z4781 Encounter for orthopedic aftercare following surgical amputation: Secondary | ICD-10-CM | POA: Diagnosis not present

## 2019-03-06 DIAGNOSIS — I2510 Atherosclerotic heart disease of native coronary artery without angina pectoris: Secondary | ICD-10-CM | POA: Diagnosis not present

## 2019-03-06 DIAGNOSIS — Z89511 Acquired absence of right leg below knee: Secondary | ICD-10-CM | POA: Diagnosis not present

## 2019-03-06 DIAGNOSIS — F17210 Nicotine dependence, cigarettes, uncomplicated: Secondary | ICD-10-CM | POA: Diagnosis not present

## 2019-03-06 DIAGNOSIS — I10 Essential (primary) hypertension: Secondary | ICD-10-CM | POA: Diagnosis not present

## 2019-03-06 DIAGNOSIS — I70209 Unspecified atherosclerosis of native arteries of extremities, unspecified extremity: Secondary | ICD-10-CM | POA: Diagnosis not present

## 2019-03-06 DIAGNOSIS — E1151 Type 2 diabetes mellitus with diabetic peripheral angiopathy without gangrene: Secondary | ICD-10-CM | POA: Diagnosis not present

## 2019-03-06 DIAGNOSIS — E1142 Type 2 diabetes mellitus with diabetic polyneuropathy: Secondary | ICD-10-CM | POA: Diagnosis not present

## 2019-03-06 DIAGNOSIS — Z89612 Acquired absence of left leg above knee: Secondary | ICD-10-CM | POA: Diagnosis not present

## 2019-03-06 NOTE — Telephone Encounter (Signed)
Pt called and said that she would like to get an order to have her hospital bed taken out of her house. She said that she would also like to get an rx for pain medication.   Called her back and asked her when home health would be back out for evaluation - she said that they would come out next week. Told her that home health would need to advise the ordering dr that she is ok without having this bed and then an order can be given.   Advised her that according to her chart, Dr Charisse Klinefelter has been prescribing her pain medication and she would need to contact him for a refill.   Julious Payer, CMA

## 2019-03-10 ENCOUNTER — Telehealth: Payer: Self-pay | Admitting: Vascular Surgery

## 2019-03-10 DIAGNOSIS — Z4781 Encounter for orthopedic aftercare following surgical amputation: Secondary | ICD-10-CM | POA: Diagnosis not present

## 2019-03-10 DIAGNOSIS — E1151 Type 2 diabetes mellitus with diabetic peripheral angiopathy without gangrene: Secondary | ICD-10-CM | POA: Diagnosis not present

## 2019-03-10 DIAGNOSIS — F17210 Nicotine dependence, cigarettes, uncomplicated: Secondary | ICD-10-CM | POA: Diagnosis not present

## 2019-03-10 DIAGNOSIS — I70209 Unspecified atherosclerosis of native arteries of extremities, unspecified extremity: Secondary | ICD-10-CM | POA: Diagnosis not present

## 2019-03-10 DIAGNOSIS — E1142 Type 2 diabetes mellitus with diabetic polyneuropathy: Secondary | ICD-10-CM | POA: Diagnosis not present

## 2019-03-10 DIAGNOSIS — I2510 Atherosclerotic heart disease of native coronary artery without angina pectoris: Secondary | ICD-10-CM | POA: Diagnosis not present

## 2019-03-10 DIAGNOSIS — I10 Essential (primary) hypertension: Secondary | ICD-10-CM | POA: Diagnosis not present

## 2019-03-10 DIAGNOSIS — Z89612 Acquired absence of left leg above knee: Secondary | ICD-10-CM | POA: Diagnosis not present

## 2019-03-10 DIAGNOSIS — Z89511 Acquired absence of right leg below knee: Secondary | ICD-10-CM | POA: Diagnosis not present

## 2019-03-10 NOTE — Telephone Encounter (Signed)
I called Tresa Endo, PT with [facility] and left voice message relaying Dr. Darrick Penna message.  I left our phone number for her to call back with any questions.   Ernst Spell., LPN

## 2019-03-10 NOTE — Telephone Encounter (Signed)
-----   Message from Sherren Kerns, MD sent at 03/10/2019 10:57 AM EDT ----- Regarding: RE: Pain Meds Contact: [phone] She is 6 weeks out.  If she needs anything more than extra strength tylenol we need to take a look at it  [name] ----- Message ----- From: [name], LPN Sent: 10/11/4313   9:27 AM EDT To: Sherren Kerns, MD Subject: Pain Meds                                      Danielle Buchanan, PT with [facility] called.  Pt is having R stump pain after BKA and is out of pain medication x 3 days.  She only has pain at night and rates pain a 9 or 10.  Please advise.  Thank you,  Ernst Spell., LPN

## 2019-03-11 ENCOUNTER — Other Ambulatory Visit: Payer: Medicare HMO

## 2019-03-11 DIAGNOSIS — I70209 Unspecified atherosclerosis of native arteries of extremities, unspecified extremity: Secondary | ICD-10-CM | POA: Diagnosis not present

## 2019-03-11 DIAGNOSIS — E1142 Type 2 diabetes mellitus with diabetic polyneuropathy: Secondary | ICD-10-CM | POA: Diagnosis not present

## 2019-03-11 DIAGNOSIS — I2510 Atherosclerotic heart disease of native coronary artery without angina pectoris: Secondary | ICD-10-CM | POA: Diagnosis not present

## 2019-03-11 DIAGNOSIS — F17210 Nicotine dependence, cigarettes, uncomplicated: Secondary | ICD-10-CM | POA: Diagnosis not present

## 2019-03-11 DIAGNOSIS — Z89612 Acquired absence of left leg above knee: Secondary | ICD-10-CM | POA: Diagnosis not present

## 2019-03-11 DIAGNOSIS — Z4781 Encounter for orthopedic aftercare following surgical amputation: Secondary | ICD-10-CM | POA: Diagnosis not present

## 2019-03-11 DIAGNOSIS — I10 Essential (primary) hypertension: Secondary | ICD-10-CM | POA: Diagnosis not present

## 2019-03-11 DIAGNOSIS — Z89511 Acquired absence of right leg below knee: Secondary | ICD-10-CM | POA: Diagnosis not present

## 2019-03-11 DIAGNOSIS — E1151 Type 2 diabetes mellitus with diabetic peripheral angiopathy without gangrene: Secondary | ICD-10-CM | POA: Diagnosis not present

## 2019-03-12 DIAGNOSIS — Z89511 Acquired absence of right leg below knee: Secondary | ICD-10-CM | POA: Diagnosis not present

## 2019-03-12 DIAGNOSIS — E1142 Type 2 diabetes mellitus with diabetic polyneuropathy: Secondary | ICD-10-CM | POA: Diagnosis not present

## 2019-03-12 DIAGNOSIS — I70209 Unspecified atherosclerosis of native arteries of extremities, unspecified extremity: Secondary | ICD-10-CM | POA: Diagnosis not present

## 2019-03-12 DIAGNOSIS — Z89612 Acquired absence of left leg above knee: Secondary | ICD-10-CM | POA: Diagnosis not present

## 2019-03-12 DIAGNOSIS — I10 Essential (primary) hypertension: Secondary | ICD-10-CM | POA: Diagnosis not present

## 2019-03-12 DIAGNOSIS — I2510 Atherosclerotic heart disease of native coronary artery without angina pectoris: Secondary | ICD-10-CM | POA: Diagnosis not present

## 2019-03-12 DIAGNOSIS — F17210 Nicotine dependence, cigarettes, uncomplicated: Secondary | ICD-10-CM | POA: Diagnosis not present

## 2019-03-12 DIAGNOSIS — E1151 Type 2 diabetes mellitus with diabetic peripheral angiopathy without gangrene: Secondary | ICD-10-CM | POA: Diagnosis not present

## 2019-03-12 DIAGNOSIS — Z4781 Encounter for orthopedic aftercare following surgical amputation: Secondary | ICD-10-CM | POA: Diagnosis not present

## 2019-03-17 DIAGNOSIS — Z89612 Acquired absence of left leg above knee: Secondary | ICD-10-CM | POA: Diagnosis not present

## 2019-03-17 DIAGNOSIS — I70209 Unspecified atherosclerosis of native arteries of extremities, unspecified extremity: Secondary | ICD-10-CM | POA: Diagnosis not present

## 2019-03-17 DIAGNOSIS — F17210 Nicotine dependence, cigarettes, uncomplicated: Secondary | ICD-10-CM | POA: Diagnosis not present

## 2019-03-17 DIAGNOSIS — E1142 Type 2 diabetes mellitus with diabetic polyneuropathy: Secondary | ICD-10-CM | POA: Diagnosis not present

## 2019-03-17 DIAGNOSIS — E1151 Type 2 diabetes mellitus with diabetic peripheral angiopathy without gangrene: Secondary | ICD-10-CM | POA: Diagnosis not present

## 2019-03-17 DIAGNOSIS — Z89511 Acquired absence of right leg below knee: Secondary | ICD-10-CM | POA: Diagnosis not present

## 2019-03-17 DIAGNOSIS — I2510 Atherosclerotic heart disease of native coronary artery without angina pectoris: Secondary | ICD-10-CM | POA: Diagnosis not present

## 2019-03-17 DIAGNOSIS — Z4781 Encounter for orthopedic aftercare following surgical amputation: Secondary | ICD-10-CM | POA: Diagnosis not present

## 2019-03-17 DIAGNOSIS — I10 Essential (primary) hypertension: Secondary | ICD-10-CM | POA: Diagnosis not present

## 2019-03-20 DIAGNOSIS — Z89612 Acquired absence of left leg above knee: Secondary | ICD-10-CM | POA: Diagnosis not present

## 2019-03-20 DIAGNOSIS — Z4781 Encounter for orthopedic aftercare following surgical amputation: Secondary | ICD-10-CM | POA: Diagnosis not present

## 2019-03-20 DIAGNOSIS — F17210 Nicotine dependence, cigarettes, uncomplicated: Secondary | ICD-10-CM | POA: Diagnosis not present

## 2019-03-20 DIAGNOSIS — E1142 Type 2 diabetes mellitus with diabetic polyneuropathy: Secondary | ICD-10-CM | POA: Diagnosis not present

## 2019-03-20 DIAGNOSIS — Z89511 Acquired absence of right leg below knee: Secondary | ICD-10-CM | POA: Diagnosis not present

## 2019-03-20 DIAGNOSIS — I70209 Unspecified atherosclerosis of native arteries of extremities, unspecified extremity: Secondary | ICD-10-CM | POA: Diagnosis not present

## 2019-03-20 DIAGNOSIS — I10 Essential (primary) hypertension: Secondary | ICD-10-CM | POA: Diagnosis not present

## 2019-03-20 DIAGNOSIS — I2510 Atherosclerotic heart disease of native coronary artery without angina pectoris: Secondary | ICD-10-CM | POA: Diagnosis not present

## 2019-03-20 DIAGNOSIS — E1151 Type 2 diabetes mellitus with diabetic peripheral angiopathy without gangrene: Secondary | ICD-10-CM | POA: Diagnosis not present

## 2019-03-21 ENCOUNTER — Other Ambulatory Visit: Payer: Self-pay | Admitting: Endocrinology

## 2019-03-22 DIAGNOSIS — I2510 Atherosclerotic heart disease of native coronary artery without angina pectoris: Secondary | ICD-10-CM | POA: Diagnosis not present

## 2019-03-22 DIAGNOSIS — Z4781 Encounter for orthopedic aftercare following surgical amputation: Secondary | ICD-10-CM | POA: Diagnosis not present

## 2019-03-22 DIAGNOSIS — I70209 Unspecified atherosclerosis of native arteries of extremities, unspecified extremity: Secondary | ICD-10-CM | POA: Diagnosis not present

## 2019-03-22 DIAGNOSIS — Z89511 Acquired absence of right leg below knee: Secondary | ICD-10-CM | POA: Diagnosis not present

## 2019-03-22 DIAGNOSIS — F17210 Nicotine dependence, cigarettes, uncomplicated: Secondary | ICD-10-CM | POA: Diagnosis not present

## 2019-03-22 DIAGNOSIS — I10 Essential (primary) hypertension: Secondary | ICD-10-CM | POA: Diagnosis not present

## 2019-03-22 DIAGNOSIS — Z89612 Acquired absence of left leg above knee: Secondary | ICD-10-CM | POA: Diagnosis not present

## 2019-03-22 DIAGNOSIS — E1151 Type 2 diabetes mellitus with diabetic peripheral angiopathy without gangrene: Secondary | ICD-10-CM | POA: Diagnosis not present

## 2019-03-22 DIAGNOSIS — E1142 Type 2 diabetes mellitus with diabetic polyneuropathy: Secondary | ICD-10-CM | POA: Diagnosis not present

## 2019-03-23 DIAGNOSIS — Z89511 Acquired absence of right leg below knee: Secondary | ICD-10-CM | POA: Diagnosis not present

## 2019-03-23 DIAGNOSIS — I70209 Unspecified atherosclerosis of native arteries of extremities, unspecified extremity: Secondary | ICD-10-CM | POA: Diagnosis not present

## 2019-03-23 DIAGNOSIS — F17210 Nicotine dependence, cigarettes, uncomplicated: Secondary | ICD-10-CM | POA: Diagnosis not present

## 2019-03-23 DIAGNOSIS — I10 Essential (primary) hypertension: Secondary | ICD-10-CM | POA: Diagnosis not present

## 2019-03-23 DIAGNOSIS — I2510 Atherosclerotic heart disease of native coronary artery without angina pectoris: Secondary | ICD-10-CM | POA: Diagnosis not present

## 2019-03-23 DIAGNOSIS — E1151 Type 2 diabetes mellitus with diabetic peripheral angiopathy without gangrene: Secondary | ICD-10-CM | POA: Diagnosis not present

## 2019-03-23 DIAGNOSIS — Z4781 Encounter for orthopedic aftercare following surgical amputation: Secondary | ICD-10-CM | POA: Diagnosis not present

## 2019-03-23 DIAGNOSIS — Z89612 Acquired absence of left leg above knee: Secondary | ICD-10-CM | POA: Diagnosis not present

## 2019-03-23 DIAGNOSIS — E1142 Type 2 diabetes mellitus with diabetic polyneuropathy: Secondary | ICD-10-CM | POA: Diagnosis not present

## 2019-03-23 NOTE — Telephone Encounter (Signed)
Please refill x 1 F/u is due  

## 2019-03-24 ENCOUNTER — Telehealth: Payer: Self-pay

## 2019-03-24 DIAGNOSIS — E1142 Type 2 diabetes mellitus with diabetic polyneuropathy: Secondary | ICD-10-CM | POA: Diagnosis not present

## 2019-03-24 DIAGNOSIS — F17210 Nicotine dependence, cigarettes, uncomplicated: Secondary | ICD-10-CM | POA: Diagnosis not present

## 2019-03-24 DIAGNOSIS — I2510 Atherosclerotic heart disease of native coronary artery without angina pectoris: Secondary | ICD-10-CM | POA: Diagnosis not present

## 2019-03-24 DIAGNOSIS — Z4781 Encounter for orthopedic aftercare following surgical amputation: Secondary | ICD-10-CM | POA: Diagnosis not present

## 2019-03-24 DIAGNOSIS — Z89511 Acquired absence of right leg below knee: Secondary | ICD-10-CM | POA: Diagnosis not present

## 2019-03-24 DIAGNOSIS — E1151 Type 2 diabetes mellitus with diabetic peripheral angiopathy without gangrene: Secondary | ICD-10-CM | POA: Diagnosis not present

## 2019-03-24 DIAGNOSIS — I10 Essential (primary) hypertension: Secondary | ICD-10-CM | POA: Diagnosis not present

## 2019-03-24 DIAGNOSIS — Z89612 Acquired absence of left leg above knee: Secondary | ICD-10-CM | POA: Diagnosis not present

## 2019-03-24 DIAGNOSIS — I70209 Unspecified atherosclerosis of native arteries of extremities, unspecified extremity: Secondary | ICD-10-CM | POA: Diagnosis not present

## 2019-03-24 NOTE — Telephone Encounter (Signed)
Pt son called and said that when she changed her dressing that it had a bit of an odor to it. He said that it was not bad but smelled kind of like smelly feet and they were told to call if anything like that occurred.   Called and spoke with patient. She said that what is draining is fairly clear with just a tinge of yellow, denies any fevers, no redness to the area and no heat to the touch.   Offered her an appt if she would like to be seen with the PA but she said that she will just continue to monitor for now and call for an appt if symptoms change.   York Cerise, CMA

## 2019-03-25 DIAGNOSIS — Z89511 Acquired absence of right leg below knee: Secondary | ICD-10-CM | POA: Diagnosis not present

## 2019-03-27 DIAGNOSIS — I10 Essential (primary) hypertension: Secondary | ICD-10-CM | POA: Diagnosis not present

## 2019-03-27 DIAGNOSIS — Z794 Long term (current) use of insulin: Secondary | ICD-10-CM | POA: Diagnosis not present

## 2019-03-27 DIAGNOSIS — E119 Type 2 diabetes mellitus without complications: Secondary | ICD-10-CM | POA: Diagnosis not present

## 2019-03-27 DIAGNOSIS — E785 Hyperlipidemia, unspecified: Secondary | ICD-10-CM | POA: Diagnosis not present

## 2019-03-30 ENCOUNTER — Telehealth: Payer: Self-pay

## 2019-03-30 NOTE — Telephone Encounter (Signed)
Son called and said that he was helping her change her dressing today and she has pus coming out of the area where her steri strips were and there is also a place that is black. He said that he is not sure if it is the skin tissue or if it is dried old blood.   Appt made for pt to be looked at tomorrow. Son said that the area is red and a little warm.   York Cerise, CMA

## 2019-03-31 ENCOUNTER — Ambulatory Visit (INDEPENDENT_AMBULATORY_CARE_PROVIDER_SITE_OTHER): Payer: Self-pay | Admitting: Physician Assistant

## 2019-03-31 ENCOUNTER — Other Ambulatory Visit: Payer: Self-pay

## 2019-03-31 VITALS — BP 139/77 | HR 67 | Temp 97.6°F | Resp 14 | Ht 61.0 in

## 2019-03-31 DIAGNOSIS — Z89511 Acquired absence of right leg below knee: Secondary | ICD-10-CM

## 2019-03-31 NOTE — Progress Notes (Signed)
POST OPERATIVE OFFICE NOTE    CC:  F/u for surgery  HPI:  This is a 63 y.o. female who is s/p right BKA on 01/19/2019 by Dr. Oneida Alar.   She was seen back on 02/19/2019 by Dr. Oneida Alar and at that time, had a small amount of incisional drainage from the left medial aspect.  She had her staples removed at that time.  She had a shrinker prescribed and scheduled to follow up as needed.  She presents today with c/o changes around the incision.  Her nephew [name] states that he has been putting dry dressing on the wound daily.  She has steri strip in place that have been there since the staples were removed last month.  She denies any fevers/chills.    She had previously undergone left below-knee amputation by Dr. Donzetta Matters October 2018.  She subsequently had a left above-knee amputation by Dr. Donzetta Matters November 2018.  She had a angioplasty of her right posterior tibial artery by Dr. Donzetta Matters January 2019.  She then underwent right fourth toe amputation January 2019.  This was followed by an angioplasty of her right tibioperoneal trunk and peroneal artery by Dr. Trula Slade February 2020.  She then had a right transmetatarsal amputation in February 2020.  Allergies  Allergen Reactions  . Metformin And Related Diarrhea  . Milk-Related Compounds Diarrhea and Other (See Comments)    incontinence  . Doxycycline Rash    Current Outpatient Medications  Medication Sig Dispense Refill  . acetaminophen (TYLENOL) 325 MG tablet Take 1-2 tablets (325-650 mg total) by mouth every 4 (four) hours as needed for mild pain (headache, or temp >/= 101 F).    Marland Kitchen aspirin 81 MG EC tablet Take 1 tablet (81 mg total) by mouth daily. 30 tablet 0  . gabapentin (NEURONTIN) 300 MG capsule Take 1 capsule (300 mg total) by mouth at bedtime. 30 capsule 0  . glimepiride (AMARYL) 2 MG tablet Take 1 tablet (2 mg total) by mouth daily with breakfast. 30 tablet 1  . insulin aspart protamine - aspart (NOVOLOG MIX 70/30 FLEXPEN) (70-30) 100 UNIT/ML FlexPen  Inject 0.75 mLs (75 Units total) into the skin 2 (two) times daily with a meal. 15 mL 11  . LORazepam (ATIVAN) 0.5 MG tablet Take 1 tablet (0.5 mg total) by mouth 2 (two) times daily as needed for anxiety. 15 tablet 0  . losartan (COZAAR) 100 MG tablet Take 1 tablet (100 mg total) by mouth daily. 30 tablet 0  . Multiple Vitamin (MULTIVITAMIN WITH MINERALS) TABS tablet Place 1 tablet into feeding tube daily. (Patient taking differently: Take 1 tablet by mouth daily. ) 30 tablet 0  . nicotine polacrilex (NICORELIEF) 2 MG gum Take 1 each (2 mg total) by mouth daily as needed for smoking cessation. (Patient taking differently: Take 2 mg by mouth 4 (four) times daily as needed for smoking cessation. ) 30 tablet 0  . NOVOLIN 70/30 (70-30) 100 UNIT/ML injection INJECT SUBCUTANEOUSLY  120 UNITS WITH BREAKFAST  AND  90 UNITS WITH SUPPER 63 mL 0  . oxyCODONE (OXY IR/ROXICODONE) 5 MG immediate release tablet Take 1-2 tablets (5-10 mg total) by mouth every 4 (four) hours as needed for moderate pain. (Patient not taking: Reported on 02/19/2019) 30 tablet 0  . oxyCODONE (OXYCONTIN) 10 mg 12 hr tablet Take 1 tablet (10 mg total) by mouth at bedtime. (Patient not taking: Reported on 02/19/2019) 14 tablet 0  . pantoprazole (PROTONIX) 40 MG tablet Take 1 tablet (40 mg total) by  mouth daily. 30 tablet 0  . polyethylene glycol (MIRALAX / GLYCOLAX) packet Take 17 g by mouth daily as needed for moderate constipation. 14 each 0  . pravastatin (PRAVACHOL) 40 MG tablet Take 1 tablet (40 mg total) by mouth daily. 30 tablet 0  . sertraline (ZOLOFT) 100 MG tablet Take 1 tablet (100 mg total) by mouth daily. 30 tablet 0  . traZODone (DESYREL) 50 MG tablet Take 1 tablet (50 mg total) by mouth at bedtime. 30 tablet 0  . TRUE METRIX BLOOD GLUCOSE TEST test strip USE TO CHECK YOUR BLOOD SUGAR TWICE A DAY (DX  E11.21) IN VITRO 90 DAYS 100 each 12  . Vitamin E 180 MG CAPS Take 180 mg by mouth daily. 30 capsule 0  . Zinc 50 MG TABS Take 1  tablet (50 mg total) by mouth daily. 30 tablet 0   No current facility-administered medications for this visit.      ROS:  See HPI  Physical Exam:  Today's Vitals   03/31/19 1536  BP: 139/77  Pulse: 67  Resp: 14  Temp: 97.6 F (36.4 C)  TempSrc: Temporal  SpO2: 97%  Height: 5\' 1"  (1.549 m)   Body mass index is 38.73 kg/m.   Incision:      Extremities:  Good ROM right knee   Assessment/Plan:  This is a 63 y.o. female who is s/p: right BKA on 01/19/2019 by Dr. Darrick PennaFields  -non healing area on the lateral aspect of the BKA incision on the right.  The wound was sharply debrided today.   -will do wet to dry saline dressing changes.  Nephew Tammy SoursGreg was instructed on how to do the dressing changes and he will do this twice daily.  HHRN, wound care center referral ordered. -will have her return in 2 weeks to see Dr. Darrick PennaFields to evaluate wound.  Should she have any issues prior to then, she will call sooner and be seen if needed.     Doreatha MassedSamantha Ketih Goodie, PA-C [facility] [phone]  Clinic MD:  Early

## 2019-04-01 ENCOUNTER — Encounter: Payer: Self-pay | Admitting: Vascular Surgery

## 2019-04-01 NOTE — Progress Notes (Signed)
Patient is a 63 year old female who was seen by my PA Samantha Rhyne yesterday in follow-up after a recent right below-knee amputation.  She has had some difficulty healing up 1 corner of the incision.  She is currently doing local wound care on this.  Patient has a history of diabetes.  She also has a history of smoking.  She also probably has a component of malnutrition related to her wound healing.  In light of these mitigating factors and wound healing obstacles, I believe she would best be managed at the wound center.  We will ask Dr. Dellia Nims for his assistance from the wound center to see if we can get this completely healed.  Ruta Hinds, MD [facility] Office: [phone] Pager: [phone]

## 2019-04-02 ENCOUNTER — Telehealth: Payer: Self-pay

## 2019-04-02 NOTE — Telephone Encounter (Signed)
Michelle with [facility] called and said that she would like to get orders to continue care for 2 weeks longer. She said that it was recommended per her understanding that patient should have amputation done and she refused. Wanted home health to help with wound care and help save the leg.   Spoke with Dr Oneida Alar and he said that pt has already had amputation and thinks that [facility] is confused. He said that they can do a wet to dry saline dressing as requested.   Message left for [facility] advising. Also told her that according to her note she is being referred to wound care and also has an appt in 2 weeks to be seen.   York Cerise, CMA

## 2019-04-06 DIAGNOSIS — N3946 Mixed incontinence: Secondary | ICD-10-CM | POA: Diagnosis not present

## 2019-04-09 ENCOUNTER — Encounter (HOSPITAL_BASED_OUTPATIENT_CLINIC_OR_DEPARTMENT_OTHER): Payer: Medicare HMO | Attending: Internal Medicine

## 2019-04-09 DIAGNOSIS — E1151 Type 2 diabetes mellitus with diabetic peripheral angiopathy without gangrene: Secondary | ICD-10-CM | POA: Diagnosis not present

## 2019-04-09 DIAGNOSIS — Y835 Amputation of limb(s) as the cause of abnormal reaction of the patient, or of later complication, without mention of misadventure at the time of the procedure: Secondary | ICD-10-CM | POA: Diagnosis not present

## 2019-04-09 DIAGNOSIS — Z89511 Acquired absence of right leg below knee: Secondary | ICD-10-CM | POA: Insufficient documentation

## 2019-04-09 DIAGNOSIS — Z89612 Acquired absence of left leg above knee: Secondary | ICD-10-CM | POA: Diagnosis not present

## 2019-04-09 DIAGNOSIS — L97819 Non-pressure chronic ulcer of other part of right lower leg with unspecified severity: Secondary | ICD-10-CM | POA: Diagnosis not present

## 2019-04-09 DIAGNOSIS — T8781 Dehiscence of amputation stump: Secondary | ICD-10-CM | POA: Diagnosis not present

## 2019-04-09 DIAGNOSIS — F17210 Nicotine dependence, cigarettes, uncomplicated: Secondary | ICD-10-CM | POA: Insufficient documentation

## 2019-04-09 DIAGNOSIS — Z794 Long term (current) use of insulin: Secondary | ICD-10-CM | POA: Insufficient documentation

## 2019-04-09 DIAGNOSIS — E1142 Type 2 diabetes mellitus with diabetic polyneuropathy: Secondary | ICD-10-CM | POA: Diagnosis not present

## 2019-04-09 DIAGNOSIS — I10 Essential (primary) hypertension: Secondary | ICD-10-CM | POA: Diagnosis not present

## 2019-04-09 DIAGNOSIS — I2510 Atherosclerotic heart disease of native coronary artery without angina pectoris: Secondary | ICD-10-CM | POA: Insufficient documentation

## 2019-04-09 DIAGNOSIS — L97812 Non-pressure chronic ulcer of other part of right lower leg with fat layer exposed: Secondary | ICD-10-CM | POA: Diagnosis not present

## 2019-04-09 DIAGNOSIS — E11622 Type 2 diabetes mellitus with other skin ulcer: Secondary | ICD-10-CM | POA: Diagnosis not present

## 2019-04-13 ENCOUNTER — Encounter (HOSPITAL_BASED_OUTPATIENT_CLINIC_OR_DEPARTMENT_OTHER): Payer: Medicare HMO

## 2019-04-15 DIAGNOSIS — I10 Essential (primary) hypertension: Secondary | ICD-10-CM | POA: Diagnosis not present

## 2019-04-15 DIAGNOSIS — F17210 Nicotine dependence, cigarettes, uncomplicated: Secondary | ICD-10-CM | POA: Diagnosis not present

## 2019-04-15 DIAGNOSIS — Z89511 Acquired absence of right leg below knee: Secondary | ICD-10-CM | POA: Diagnosis not present

## 2019-04-15 DIAGNOSIS — I70209 Unspecified atherosclerosis of native arteries of extremities, unspecified extremity: Secondary | ICD-10-CM | POA: Diagnosis not present

## 2019-04-15 DIAGNOSIS — E1142 Type 2 diabetes mellitus with diabetic polyneuropathy: Secondary | ICD-10-CM | POA: Diagnosis not present

## 2019-04-15 DIAGNOSIS — E1151 Type 2 diabetes mellitus with diabetic peripheral angiopathy without gangrene: Secondary | ICD-10-CM | POA: Diagnosis not present

## 2019-04-15 DIAGNOSIS — I2510 Atherosclerotic heart disease of native coronary artery without angina pectoris: Secondary | ICD-10-CM | POA: Diagnosis not present

## 2019-04-15 DIAGNOSIS — Z89612 Acquired absence of left leg above knee: Secondary | ICD-10-CM | POA: Diagnosis not present

## 2019-04-15 DIAGNOSIS — Z4781 Encounter for orthopedic aftercare following surgical amputation: Secondary | ICD-10-CM | POA: Diagnosis not present

## 2019-04-16 ENCOUNTER — Ambulatory Visit: Payer: Medicare HMO | Admitting: Vascular Surgery

## 2019-04-21 DIAGNOSIS — E1151 Type 2 diabetes mellitus with diabetic peripheral angiopathy without gangrene: Secondary | ICD-10-CM | POA: Diagnosis not present

## 2019-04-21 DIAGNOSIS — Z89612 Acquired absence of left leg above knee: Secondary | ICD-10-CM | POA: Diagnosis not present

## 2019-04-21 DIAGNOSIS — Z4781 Encounter for orthopedic aftercare following surgical amputation: Secondary | ICD-10-CM | POA: Diagnosis not present

## 2019-04-21 DIAGNOSIS — Z89511 Acquired absence of right leg below knee: Secondary | ICD-10-CM | POA: Diagnosis not present

## 2019-04-21 DIAGNOSIS — F17210 Nicotine dependence, cigarettes, uncomplicated: Secondary | ICD-10-CM | POA: Diagnosis not present

## 2019-04-21 DIAGNOSIS — E1142 Type 2 diabetes mellitus with diabetic polyneuropathy: Secondary | ICD-10-CM | POA: Diagnosis not present

## 2019-04-21 DIAGNOSIS — I70209 Unspecified atherosclerosis of native arteries of extremities, unspecified extremity: Secondary | ICD-10-CM | POA: Diagnosis not present

## 2019-04-21 DIAGNOSIS — I2510 Atherosclerotic heart disease of native coronary artery without angina pectoris: Secondary | ICD-10-CM | POA: Diagnosis not present

## 2019-04-21 DIAGNOSIS — I10 Essential (primary) hypertension: Secondary | ICD-10-CM | POA: Diagnosis not present

## 2019-04-22 DIAGNOSIS — E1142 Type 2 diabetes mellitus with diabetic polyneuropathy: Secondary | ICD-10-CM | POA: Diagnosis not present

## 2019-04-22 DIAGNOSIS — F17210 Nicotine dependence, cigarettes, uncomplicated: Secondary | ICD-10-CM | POA: Diagnosis not present

## 2019-04-22 DIAGNOSIS — Z4781 Encounter for orthopedic aftercare following surgical amputation: Secondary | ICD-10-CM | POA: Diagnosis not present

## 2019-04-22 DIAGNOSIS — Z89511 Acquired absence of right leg below knee: Secondary | ICD-10-CM | POA: Diagnosis not present

## 2019-04-22 DIAGNOSIS — I2510 Atherosclerotic heart disease of native coronary artery without angina pectoris: Secondary | ICD-10-CM | POA: Diagnosis not present

## 2019-04-22 DIAGNOSIS — E1151 Type 2 diabetes mellitus with diabetic peripheral angiopathy without gangrene: Secondary | ICD-10-CM | POA: Diagnosis not present

## 2019-04-22 DIAGNOSIS — I10 Essential (primary) hypertension: Secondary | ICD-10-CM | POA: Diagnosis not present

## 2019-04-22 DIAGNOSIS — I70209 Unspecified atherosclerosis of native arteries of extremities, unspecified extremity: Secondary | ICD-10-CM | POA: Diagnosis not present

## 2019-04-22 DIAGNOSIS — Z89612 Acquired absence of left leg above knee: Secondary | ICD-10-CM | POA: Diagnosis not present

## 2019-04-23 DIAGNOSIS — I10 Essential (primary) hypertension: Secondary | ICD-10-CM | POA: Diagnosis not present

## 2019-04-23 DIAGNOSIS — Z89511 Acquired absence of right leg below knee: Secondary | ICD-10-CM | POA: Diagnosis not present

## 2019-04-23 DIAGNOSIS — Z794 Long term (current) use of insulin: Secondary | ICD-10-CM | POA: Diagnosis not present

## 2019-04-23 DIAGNOSIS — E1151 Type 2 diabetes mellitus with diabetic peripheral angiopathy without gangrene: Secondary | ICD-10-CM | POA: Diagnosis not present

## 2019-04-23 DIAGNOSIS — F17210 Nicotine dependence, cigarettes, uncomplicated: Secondary | ICD-10-CM | POA: Diagnosis not present

## 2019-04-23 DIAGNOSIS — E1142 Type 2 diabetes mellitus with diabetic polyneuropathy: Secondary | ICD-10-CM | POA: Diagnosis not present

## 2019-04-23 DIAGNOSIS — I2510 Atherosclerotic heart disease of native coronary artery without angina pectoris: Secondary | ICD-10-CM | POA: Diagnosis not present

## 2019-04-23 DIAGNOSIS — T8781 Dehiscence of amputation stump: Secondary | ICD-10-CM | POA: Diagnosis not present

## 2019-04-23 DIAGNOSIS — Z89612 Acquired absence of left leg above knee: Secondary | ICD-10-CM | POA: Diagnosis not present

## 2019-04-27 DIAGNOSIS — E1142 Type 2 diabetes mellitus with diabetic polyneuropathy: Secondary | ICD-10-CM | POA: Diagnosis not present

## 2019-04-27 DIAGNOSIS — Z89612 Acquired absence of left leg above knee: Secondary | ICD-10-CM | POA: Diagnosis not present

## 2019-04-27 DIAGNOSIS — Z89511 Acquired absence of right leg below knee: Secondary | ICD-10-CM | POA: Diagnosis not present

## 2019-04-27 DIAGNOSIS — F17210 Nicotine dependence, cigarettes, uncomplicated: Secondary | ICD-10-CM | POA: Diagnosis not present

## 2019-04-27 DIAGNOSIS — I70209 Unspecified atherosclerosis of native arteries of extremities, unspecified extremity: Secondary | ICD-10-CM | POA: Diagnosis not present

## 2019-04-27 DIAGNOSIS — E1151 Type 2 diabetes mellitus with diabetic peripheral angiopathy without gangrene: Secondary | ICD-10-CM | POA: Diagnosis not present

## 2019-04-27 DIAGNOSIS — Z4781 Encounter for orthopedic aftercare following surgical amputation: Secondary | ICD-10-CM | POA: Diagnosis not present

## 2019-04-27 DIAGNOSIS — I2510 Atherosclerotic heart disease of native coronary artery without angina pectoris: Secondary | ICD-10-CM | POA: Diagnosis not present

## 2019-04-27 DIAGNOSIS — I10 Essential (primary) hypertension: Secondary | ICD-10-CM | POA: Diagnosis not present

## 2019-04-28 DIAGNOSIS — Z89612 Acquired absence of left leg above knee: Secondary | ICD-10-CM | POA: Diagnosis not present

## 2019-04-28 DIAGNOSIS — F17210 Nicotine dependence, cigarettes, uncomplicated: Secondary | ICD-10-CM | POA: Diagnosis not present

## 2019-04-28 DIAGNOSIS — I10 Essential (primary) hypertension: Secondary | ICD-10-CM | POA: Diagnosis not present

## 2019-04-28 DIAGNOSIS — E1151 Type 2 diabetes mellitus with diabetic peripheral angiopathy without gangrene: Secondary | ICD-10-CM | POA: Diagnosis not present

## 2019-04-28 DIAGNOSIS — I2510 Atherosclerotic heart disease of native coronary artery without angina pectoris: Secondary | ICD-10-CM | POA: Diagnosis not present

## 2019-04-28 DIAGNOSIS — Z4781 Encounter for orthopedic aftercare following surgical amputation: Secondary | ICD-10-CM | POA: Diagnosis not present

## 2019-04-28 DIAGNOSIS — E1142 Type 2 diabetes mellitus with diabetic polyneuropathy: Secondary | ICD-10-CM | POA: Diagnosis not present

## 2019-04-28 DIAGNOSIS — Z89511 Acquired absence of right leg below knee: Secondary | ICD-10-CM | POA: Diagnosis not present

## 2019-04-28 DIAGNOSIS — I70209 Unspecified atherosclerosis of native arteries of extremities, unspecified extremity: Secondary | ICD-10-CM | POA: Diagnosis not present

## 2019-04-29 DIAGNOSIS — Z4781 Encounter for orthopedic aftercare following surgical amputation: Secondary | ICD-10-CM | POA: Diagnosis not present

## 2019-04-29 DIAGNOSIS — E1151 Type 2 diabetes mellitus with diabetic peripheral angiopathy without gangrene: Secondary | ICD-10-CM | POA: Diagnosis not present

## 2019-04-29 DIAGNOSIS — E1142 Type 2 diabetes mellitus with diabetic polyneuropathy: Secondary | ICD-10-CM | POA: Diagnosis not present

## 2019-04-29 DIAGNOSIS — I10 Essential (primary) hypertension: Secondary | ICD-10-CM | POA: Diagnosis not present

## 2019-04-29 DIAGNOSIS — I2510 Atherosclerotic heart disease of native coronary artery without angina pectoris: Secondary | ICD-10-CM | POA: Diagnosis not present

## 2019-04-29 DIAGNOSIS — I70209 Unspecified atherosclerosis of native arteries of extremities, unspecified extremity: Secondary | ICD-10-CM | POA: Diagnosis not present

## 2019-04-29 DIAGNOSIS — F17210 Nicotine dependence, cigarettes, uncomplicated: Secondary | ICD-10-CM | POA: Diagnosis not present

## 2019-04-29 DIAGNOSIS — Z89511 Acquired absence of right leg below knee: Secondary | ICD-10-CM | POA: Diagnosis not present

## 2019-04-29 DIAGNOSIS — Z89612 Acquired absence of left leg above knee: Secondary | ICD-10-CM | POA: Diagnosis not present

## 2019-04-30 DIAGNOSIS — I10 Essential (primary) hypertension: Secondary | ICD-10-CM | POA: Diagnosis not present

## 2019-04-30 DIAGNOSIS — Z89511 Acquired absence of right leg below knee: Secondary | ICD-10-CM | POA: Diagnosis not present

## 2019-04-30 DIAGNOSIS — Z89612 Acquired absence of left leg above knee: Secondary | ICD-10-CM | POA: Diagnosis not present

## 2019-04-30 DIAGNOSIS — F17210 Nicotine dependence, cigarettes, uncomplicated: Secondary | ICD-10-CM | POA: Diagnosis not present

## 2019-04-30 DIAGNOSIS — E1142 Type 2 diabetes mellitus with diabetic polyneuropathy: Secondary | ICD-10-CM | POA: Diagnosis not present

## 2019-04-30 DIAGNOSIS — E1151 Type 2 diabetes mellitus with diabetic peripheral angiopathy without gangrene: Secondary | ICD-10-CM | POA: Diagnosis not present

## 2019-04-30 DIAGNOSIS — I2510 Atherosclerotic heart disease of native coronary artery without angina pectoris: Secondary | ICD-10-CM | POA: Diagnosis not present

## 2019-04-30 DIAGNOSIS — T8781 Dehiscence of amputation stump: Secondary | ICD-10-CM | POA: Diagnosis not present

## 2019-04-30 DIAGNOSIS — Z794 Long term (current) use of insulin: Secondary | ICD-10-CM | POA: Diagnosis not present

## 2019-05-05 DIAGNOSIS — I10 Essential (primary) hypertension: Secondary | ICD-10-CM | POA: Diagnosis not present

## 2019-05-05 DIAGNOSIS — Z89612 Acquired absence of left leg above knee: Secondary | ICD-10-CM | POA: Diagnosis not present

## 2019-05-05 DIAGNOSIS — I70209 Unspecified atherosclerosis of native arteries of extremities, unspecified extremity: Secondary | ICD-10-CM | POA: Diagnosis not present

## 2019-05-05 DIAGNOSIS — E1151 Type 2 diabetes mellitus with diabetic peripheral angiopathy without gangrene: Secondary | ICD-10-CM | POA: Diagnosis not present

## 2019-05-05 DIAGNOSIS — E1142 Type 2 diabetes mellitus with diabetic polyneuropathy: Secondary | ICD-10-CM | POA: Diagnosis not present

## 2019-05-05 DIAGNOSIS — F17210 Nicotine dependence, cigarettes, uncomplicated: Secondary | ICD-10-CM | POA: Diagnosis not present

## 2019-05-05 DIAGNOSIS — Z89511 Acquired absence of right leg below knee: Secondary | ICD-10-CM | POA: Diagnosis not present

## 2019-05-05 DIAGNOSIS — Z4781 Encounter for orthopedic aftercare following surgical amputation: Secondary | ICD-10-CM | POA: Diagnosis not present

## 2019-05-05 DIAGNOSIS — I2510 Atherosclerotic heart disease of native coronary artery without angina pectoris: Secondary | ICD-10-CM | POA: Diagnosis not present

## 2019-05-06 DIAGNOSIS — E1151 Type 2 diabetes mellitus with diabetic peripheral angiopathy without gangrene: Secondary | ICD-10-CM | POA: Diagnosis not present

## 2019-05-06 DIAGNOSIS — E1142 Type 2 diabetes mellitus with diabetic polyneuropathy: Secondary | ICD-10-CM | POA: Diagnosis not present

## 2019-05-06 DIAGNOSIS — Z4781 Encounter for orthopedic aftercare following surgical amputation: Secondary | ICD-10-CM | POA: Diagnosis not present

## 2019-05-06 DIAGNOSIS — Z89511 Acquired absence of right leg below knee: Secondary | ICD-10-CM | POA: Diagnosis not present

## 2019-05-06 DIAGNOSIS — I10 Essential (primary) hypertension: Secondary | ICD-10-CM | POA: Diagnosis not present

## 2019-05-06 DIAGNOSIS — I70209 Unspecified atherosclerosis of native arteries of extremities, unspecified extremity: Secondary | ICD-10-CM | POA: Diagnosis not present

## 2019-05-06 DIAGNOSIS — I2510 Atherosclerotic heart disease of native coronary artery without angina pectoris: Secondary | ICD-10-CM | POA: Diagnosis not present

## 2019-05-06 DIAGNOSIS — Z89612 Acquired absence of left leg above knee: Secondary | ICD-10-CM | POA: Diagnosis not present

## 2019-05-06 DIAGNOSIS — F17210 Nicotine dependence, cigarettes, uncomplicated: Secondary | ICD-10-CM | POA: Diagnosis not present

## 2019-05-07 DIAGNOSIS — I2510 Atherosclerotic heart disease of native coronary artery without angina pectoris: Secondary | ICD-10-CM | POA: Diagnosis not present

## 2019-05-07 DIAGNOSIS — F17210 Nicotine dependence, cigarettes, uncomplicated: Secondary | ICD-10-CM | POA: Diagnosis not present

## 2019-05-07 DIAGNOSIS — Z89612 Acquired absence of left leg above knee: Secondary | ICD-10-CM | POA: Diagnosis not present

## 2019-05-07 DIAGNOSIS — I10 Essential (primary) hypertension: Secondary | ICD-10-CM | POA: Diagnosis not present

## 2019-05-07 DIAGNOSIS — T8781 Dehiscence of amputation stump: Secondary | ICD-10-CM | POA: Diagnosis not present

## 2019-05-07 DIAGNOSIS — E1151 Type 2 diabetes mellitus with diabetic peripheral angiopathy without gangrene: Secondary | ICD-10-CM | POA: Diagnosis not present

## 2019-05-07 DIAGNOSIS — Z89511 Acquired absence of right leg below knee: Secondary | ICD-10-CM | POA: Diagnosis not present

## 2019-05-07 DIAGNOSIS — Z794 Long term (current) use of insulin: Secondary | ICD-10-CM | POA: Diagnosis not present

## 2019-05-07 DIAGNOSIS — E1142 Type 2 diabetes mellitus with diabetic polyneuropathy: Secondary | ICD-10-CM | POA: Diagnosis not present

## 2019-05-12 ENCOUNTER — Other Ambulatory Visit: Payer: Self-pay | Admitting: Surgery

## 2019-05-12 DIAGNOSIS — Z4781 Encounter for orthopedic aftercare following surgical amputation: Secondary | ICD-10-CM | POA: Diagnosis not present

## 2019-05-12 DIAGNOSIS — Z89612 Acquired absence of left leg above knee: Secondary | ICD-10-CM | POA: Diagnosis not present

## 2019-05-12 DIAGNOSIS — F17210 Nicotine dependence, cigarettes, uncomplicated: Secondary | ICD-10-CM | POA: Diagnosis not present

## 2019-05-12 DIAGNOSIS — Z89511 Acquired absence of right leg below knee: Secondary | ICD-10-CM | POA: Diagnosis not present

## 2019-05-12 DIAGNOSIS — I2510 Atherosclerotic heart disease of native coronary artery without angina pectoris: Secondary | ICD-10-CM | POA: Diagnosis not present

## 2019-05-12 DIAGNOSIS — I70209 Unspecified atherosclerosis of native arteries of extremities, unspecified extremity: Secondary | ICD-10-CM | POA: Diagnosis not present

## 2019-05-12 DIAGNOSIS — E1151 Type 2 diabetes mellitus with diabetic peripheral angiopathy without gangrene: Secondary | ICD-10-CM | POA: Diagnosis not present

## 2019-05-12 DIAGNOSIS — I10 Essential (primary) hypertension: Secondary | ICD-10-CM | POA: Diagnosis not present

## 2019-05-12 DIAGNOSIS — E1142 Type 2 diabetes mellitus with diabetic polyneuropathy: Secondary | ICD-10-CM | POA: Diagnosis not present

## 2019-05-13 DIAGNOSIS — Z89612 Acquired absence of left leg above knee: Secondary | ICD-10-CM | POA: Diagnosis not present

## 2019-05-13 DIAGNOSIS — E1142 Type 2 diabetes mellitus with diabetic polyneuropathy: Secondary | ICD-10-CM | POA: Diagnosis not present

## 2019-05-13 DIAGNOSIS — I70209 Unspecified atherosclerosis of native arteries of extremities, unspecified extremity: Secondary | ICD-10-CM | POA: Diagnosis not present

## 2019-05-13 DIAGNOSIS — I2510 Atherosclerotic heart disease of native coronary artery without angina pectoris: Secondary | ICD-10-CM | POA: Diagnosis not present

## 2019-05-13 DIAGNOSIS — I10 Essential (primary) hypertension: Secondary | ICD-10-CM | POA: Diagnosis not present

## 2019-05-13 DIAGNOSIS — F17210 Nicotine dependence, cigarettes, uncomplicated: Secondary | ICD-10-CM | POA: Diagnosis not present

## 2019-05-13 DIAGNOSIS — Z4781 Encounter for orthopedic aftercare following surgical amputation: Secondary | ICD-10-CM | POA: Diagnosis not present

## 2019-05-13 DIAGNOSIS — Z89511 Acquired absence of right leg below knee: Secondary | ICD-10-CM | POA: Diagnosis not present

## 2019-05-13 DIAGNOSIS — E1151 Type 2 diabetes mellitus with diabetic peripheral angiopathy without gangrene: Secondary | ICD-10-CM | POA: Diagnosis not present

## 2019-05-18 DIAGNOSIS — Z89511 Acquired absence of right leg below knee: Secondary | ICD-10-CM | POA: Diagnosis not present

## 2019-05-18 DIAGNOSIS — E1142 Type 2 diabetes mellitus with diabetic polyneuropathy: Secondary | ICD-10-CM | POA: Diagnosis not present

## 2019-05-18 DIAGNOSIS — E1151 Type 2 diabetes mellitus with diabetic peripheral angiopathy without gangrene: Secondary | ICD-10-CM | POA: Diagnosis not present

## 2019-05-18 DIAGNOSIS — Z4781 Encounter for orthopedic aftercare following surgical amputation: Secondary | ICD-10-CM | POA: Diagnosis not present

## 2019-05-18 DIAGNOSIS — I10 Essential (primary) hypertension: Secondary | ICD-10-CM | POA: Diagnosis not present

## 2019-05-18 DIAGNOSIS — Z89612 Acquired absence of left leg above knee: Secondary | ICD-10-CM | POA: Diagnosis not present

## 2019-05-18 DIAGNOSIS — I2510 Atherosclerotic heart disease of native coronary artery without angina pectoris: Secondary | ICD-10-CM | POA: Diagnosis not present

## 2019-05-18 DIAGNOSIS — F17210 Nicotine dependence, cigarettes, uncomplicated: Secondary | ICD-10-CM | POA: Diagnosis not present

## 2019-05-18 DIAGNOSIS — I70209 Unspecified atherosclerosis of native arteries of extremities, unspecified extremity: Secondary | ICD-10-CM | POA: Diagnosis not present

## 2019-05-19 ENCOUNTER — Ambulatory Visit
Admission: RE | Admit: 2019-05-19 | Discharge: 2019-05-19 | Disposition: A | Discharge status: Home / Self Care | Payer: Medicare HMO | Source: Ambulatory Visit | Attending: Family Medicine | Admitting: Family Medicine

## 2019-05-19 DIAGNOSIS — I6523 Occlusion and stenosis of bilateral carotid arteries: Secondary | ICD-10-CM | POA: Diagnosis not present

## 2019-05-19 DIAGNOSIS — M542 Cervicalgia: Secondary | ICD-10-CM

## 2019-05-20 DIAGNOSIS — Z89511 Acquired absence of right leg below knee: Secondary | ICD-10-CM | POA: Diagnosis not present

## 2019-05-20 DIAGNOSIS — F17210 Nicotine dependence, cigarettes, uncomplicated: Secondary | ICD-10-CM | POA: Diagnosis not present

## 2019-05-20 DIAGNOSIS — I10 Essential (primary) hypertension: Secondary | ICD-10-CM | POA: Diagnosis not present

## 2019-05-20 DIAGNOSIS — I70209 Unspecified atherosclerosis of native arteries of extremities, unspecified extremity: Secondary | ICD-10-CM | POA: Diagnosis not present

## 2019-05-20 DIAGNOSIS — I2510 Atherosclerotic heart disease of native coronary artery without angina pectoris: Secondary | ICD-10-CM | POA: Diagnosis not present

## 2019-05-20 DIAGNOSIS — Z4781 Encounter for orthopedic aftercare following surgical amputation: Secondary | ICD-10-CM | POA: Diagnosis not present

## 2019-05-20 DIAGNOSIS — E1142 Type 2 diabetes mellitus with diabetic polyneuropathy: Secondary | ICD-10-CM | POA: Diagnosis not present

## 2019-05-20 DIAGNOSIS — E1151 Type 2 diabetes mellitus with diabetic peripheral angiopathy without gangrene: Secondary | ICD-10-CM | POA: Diagnosis not present

## 2019-05-20 DIAGNOSIS — Z89612 Acquired absence of left leg above knee: Secondary | ICD-10-CM | POA: Diagnosis not present

## 2019-05-21 ENCOUNTER — Encounter (HOSPITAL_BASED_OUTPATIENT_CLINIC_OR_DEPARTMENT_OTHER): Payer: Medicare HMO | Attending: Internal Medicine

## 2019-05-21 DIAGNOSIS — L97812 Non-pressure chronic ulcer of other part of right lower leg with fat layer exposed: Secondary | ICD-10-CM | POA: Diagnosis not present

## 2019-05-21 DIAGNOSIS — E11622 Type 2 diabetes mellitus with other skin ulcer: Secondary | ICD-10-CM | POA: Insufficient documentation

## 2019-05-21 DIAGNOSIS — E1140 Type 2 diabetes mellitus with diabetic neuropathy, unspecified: Secondary | ICD-10-CM | POA: Insufficient documentation

## 2019-05-21 DIAGNOSIS — Z89511 Acquired absence of right leg below knee: Secondary | ICD-10-CM | POA: Insufficient documentation

## 2019-05-21 DIAGNOSIS — E1151 Type 2 diabetes mellitus with diabetic peripheral angiopathy without gangrene: Secondary | ICD-10-CM | POA: Insufficient documentation

## 2019-05-21 DIAGNOSIS — I10 Essential (primary) hypertension: Secondary | ICD-10-CM | POA: Insufficient documentation

## 2019-05-21 DIAGNOSIS — Z89612 Acquired absence of left leg above knee: Secondary | ICD-10-CM | POA: Insufficient documentation

## 2019-05-21 DIAGNOSIS — T8781 Dehiscence of amputation stump: Secondary | ICD-10-CM | POA: Diagnosis not present

## 2019-05-22 DIAGNOSIS — I10 Essential (primary) hypertension: Secondary | ICD-10-CM | POA: Diagnosis not present

## 2019-05-22 DIAGNOSIS — Z89612 Acquired absence of left leg above knee: Secondary | ICD-10-CM | POA: Diagnosis not present

## 2019-05-22 DIAGNOSIS — F17210 Nicotine dependence, cigarettes, uncomplicated: Secondary | ICD-10-CM | POA: Diagnosis not present

## 2019-05-22 DIAGNOSIS — E1151 Type 2 diabetes mellitus with diabetic peripheral angiopathy without gangrene: Secondary | ICD-10-CM | POA: Diagnosis not present

## 2019-05-22 DIAGNOSIS — Z89511 Acquired absence of right leg below knee: Secondary | ICD-10-CM | POA: Diagnosis not present

## 2019-05-22 DIAGNOSIS — E1142 Type 2 diabetes mellitus with diabetic polyneuropathy: Secondary | ICD-10-CM | POA: Diagnosis not present

## 2019-05-22 DIAGNOSIS — I2510 Atherosclerotic heart disease of native coronary artery without angina pectoris: Secondary | ICD-10-CM | POA: Diagnosis not present

## 2019-05-22 DIAGNOSIS — Z4781 Encounter for orthopedic aftercare following surgical amputation: Secondary | ICD-10-CM | POA: Diagnosis not present

## 2019-05-22 DIAGNOSIS — I70209 Unspecified atherosclerosis of native arteries of extremities, unspecified extremity: Secondary | ICD-10-CM | POA: Diagnosis not present

## 2019-05-25 DIAGNOSIS — Z89612 Acquired absence of left leg above knee: Secondary | ICD-10-CM | POA: Diagnosis not present

## 2019-05-25 DIAGNOSIS — F17210 Nicotine dependence, cigarettes, uncomplicated: Secondary | ICD-10-CM | POA: Diagnosis not present

## 2019-05-25 DIAGNOSIS — Z4781 Encounter for orthopedic aftercare following surgical amputation: Secondary | ICD-10-CM | POA: Diagnosis not present

## 2019-05-25 DIAGNOSIS — I2510 Atherosclerotic heart disease of native coronary artery without angina pectoris: Secondary | ICD-10-CM | POA: Diagnosis not present

## 2019-05-25 DIAGNOSIS — Z89511 Acquired absence of right leg below knee: Secondary | ICD-10-CM | POA: Diagnosis not present

## 2019-05-25 DIAGNOSIS — E1151 Type 2 diabetes mellitus with diabetic peripheral angiopathy without gangrene: Secondary | ICD-10-CM | POA: Diagnosis not present

## 2019-05-25 DIAGNOSIS — I70209 Unspecified atherosclerosis of native arteries of extremities, unspecified extremity: Secondary | ICD-10-CM | POA: Diagnosis not present

## 2019-05-25 DIAGNOSIS — I10 Essential (primary) hypertension: Secondary | ICD-10-CM | POA: Diagnosis not present

## 2019-05-25 DIAGNOSIS — E1142 Type 2 diabetes mellitus with diabetic polyneuropathy: Secondary | ICD-10-CM | POA: Diagnosis not present

## 2019-05-29 DIAGNOSIS — I2510 Atherosclerotic heart disease of native coronary artery without angina pectoris: Secondary | ICD-10-CM | POA: Diagnosis not present

## 2019-05-29 DIAGNOSIS — Z4781 Encounter for orthopedic aftercare following surgical amputation: Secondary | ICD-10-CM | POA: Diagnosis not present

## 2019-05-29 DIAGNOSIS — Z89511 Acquired absence of right leg below knee: Secondary | ICD-10-CM | POA: Diagnosis not present

## 2019-05-29 DIAGNOSIS — E1142 Type 2 diabetes mellitus with diabetic polyneuropathy: Secondary | ICD-10-CM | POA: Diagnosis not present

## 2019-05-29 DIAGNOSIS — I10 Essential (primary) hypertension: Secondary | ICD-10-CM | POA: Diagnosis not present

## 2019-05-29 DIAGNOSIS — F17210 Nicotine dependence, cigarettes, uncomplicated: Secondary | ICD-10-CM | POA: Diagnosis not present

## 2019-05-29 DIAGNOSIS — E1151 Type 2 diabetes mellitus with diabetic peripheral angiopathy without gangrene: Secondary | ICD-10-CM | POA: Diagnosis not present

## 2019-05-29 DIAGNOSIS — Z89612 Acquired absence of left leg above knee: Secondary | ICD-10-CM | POA: Diagnosis not present

## 2019-05-29 DIAGNOSIS — I70209 Unspecified atherosclerosis of native arteries of extremities, unspecified extremity: Secondary | ICD-10-CM | POA: Diagnosis not present

## 2019-06-01 DIAGNOSIS — I70209 Unspecified atherosclerosis of native arteries of extremities, unspecified extremity: Secondary | ICD-10-CM | POA: Diagnosis not present

## 2019-06-01 DIAGNOSIS — F17210 Nicotine dependence, cigarettes, uncomplicated: Secondary | ICD-10-CM | POA: Diagnosis not present

## 2019-06-01 DIAGNOSIS — I10 Essential (primary) hypertension: Secondary | ICD-10-CM | POA: Diagnosis not present

## 2019-06-01 DIAGNOSIS — E1151 Type 2 diabetes mellitus with diabetic peripheral angiopathy without gangrene: Secondary | ICD-10-CM | POA: Diagnosis not present

## 2019-06-01 DIAGNOSIS — I2510 Atherosclerotic heart disease of native coronary artery without angina pectoris: Secondary | ICD-10-CM | POA: Diagnosis not present

## 2019-06-01 DIAGNOSIS — E1142 Type 2 diabetes mellitus with diabetic polyneuropathy: Secondary | ICD-10-CM | POA: Diagnosis not present

## 2019-06-01 DIAGNOSIS — Z4781 Encounter for orthopedic aftercare following surgical amputation: Secondary | ICD-10-CM | POA: Diagnosis not present

## 2019-06-01 DIAGNOSIS — Z89511 Acquired absence of right leg below knee: Secondary | ICD-10-CM | POA: Diagnosis not present

## 2019-06-01 DIAGNOSIS — Z89612 Acquired absence of left leg above knee: Secondary | ICD-10-CM | POA: Diagnosis not present

## 2019-06-02 DIAGNOSIS — I70209 Unspecified atherosclerosis of native arteries of extremities, unspecified extremity: Secondary | ICD-10-CM | POA: Diagnosis not present

## 2019-06-02 DIAGNOSIS — E1142 Type 2 diabetes mellitus with diabetic polyneuropathy: Secondary | ICD-10-CM | POA: Diagnosis not present

## 2019-06-02 DIAGNOSIS — I10 Essential (primary) hypertension: Secondary | ICD-10-CM | POA: Diagnosis not present

## 2019-06-02 DIAGNOSIS — F17210 Nicotine dependence, cigarettes, uncomplicated: Secondary | ICD-10-CM | POA: Diagnosis not present

## 2019-06-02 DIAGNOSIS — Z89511 Acquired absence of right leg below knee: Secondary | ICD-10-CM | POA: Diagnosis not present

## 2019-06-02 DIAGNOSIS — Z89612 Acquired absence of left leg above knee: Secondary | ICD-10-CM | POA: Diagnosis not present

## 2019-06-02 DIAGNOSIS — I2510 Atherosclerotic heart disease of native coronary artery without angina pectoris: Secondary | ICD-10-CM | POA: Diagnosis not present

## 2019-06-02 DIAGNOSIS — E1151 Type 2 diabetes mellitus with diabetic peripheral angiopathy without gangrene: Secondary | ICD-10-CM | POA: Diagnosis not present

## 2019-06-02 DIAGNOSIS — Z4781 Encounter for orthopedic aftercare following surgical amputation: Secondary | ICD-10-CM | POA: Diagnosis not present

## 2019-06-03 DIAGNOSIS — I10 Essential (primary) hypertension: Secondary | ICD-10-CM | POA: Diagnosis not present

## 2019-06-03 DIAGNOSIS — Z4781 Encounter for orthopedic aftercare following surgical amputation: Secondary | ICD-10-CM | POA: Diagnosis not present

## 2019-06-03 DIAGNOSIS — I70209 Unspecified atherosclerosis of native arteries of extremities, unspecified extremity: Secondary | ICD-10-CM | POA: Diagnosis not present

## 2019-06-03 DIAGNOSIS — I2510 Atherosclerotic heart disease of native coronary artery without angina pectoris: Secondary | ICD-10-CM | POA: Diagnosis not present

## 2019-06-03 DIAGNOSIS — E1151 Type 2 diabetes mellitus with diabetic peripheral angiopathy without gangrene: Secondary | ICD-10-CM | POA: Diagnosis not present

## 2019-06-03 DIAGNOSIS — Z89612 Acquired absence of left leg above knee: Secondary | ICD-10-CM | POA: Diagnosis not present

## 2019-06-03 DIAGNOSIS — Z89511 Acquired absence of right leg below knee: Secondary | ICD-10-CM | POA: Diagnosis not present

## 2019-06-03 DIAGNOSIS — E1142 Type 2 diabetes mellitus with diabetic polyneuropathy: Secondary | ICD-10-CM | POA: Diagnosis not present

## 2019-06-03 DIAGNOSIS — F17210 Nicotine dependence, cigarettes, uncomplicated: Secondary | ICD-10-CM | POA: Diagnosis not present

## 2019-06-05 DIAGNOSIS — E1151 Type 2 diabetes mellitus with diabetic peripheral angiopathy without gangrene: Secondary | ICD-10-CM | POA: Diagnosis not present

## 2019-06-05 DIAGNOSIS — I70209 Unspecified atherosclerosis of native arteries of extremities, unspecified extremity: Secondary | ICD-10-CM | POA: Diagnosis not present

## 2019-06-05 DIAGNOSIS — Z89612 Acquired absence of left leg above knee: Secondary | ICD-10-CM | POA: Diagnosis not present

## 2019-06-05 DIAGNOSIS — E1142 Type 2 diabetes mellitus with diabetic polyneuropathy: Secondary | ICD-10-CM | POA: Diagnosis not present

## 2019-06-05 DIAGNOSIS — I10 Essential (primary) hypertension: Secondary | ICD-10-CM | POA: Diagnosis not present

## 2019-06-05 DIAGNOSIS — F17210 Nicotine dependence, cigarettes, uncomplicated: Secondary | ICD-10-CM | POA: Diagnosis not present

## 2019-06-05 DIAGNOSIS — Z89511 Acquired absence of right leg below knee: Secondary | ICD-10-CM | POA: Diagnosis not present

## 2019-06-05 DIAGNOSIS — Z4781 Encounter for orthopedic aftercare following surgical amputation: Secondary | ICD-10-CM | POA: Diagnosis not present

## 2019-06-05 DIAGNOSIS — I2510 Atherosclerotic heart disease of native coronary artery without angina pectoris: Secondary | ICD-10-CM | POA: Diagnosis not present

## 2019-06-08 DIAGNOSIS — N3946 Mixed incontinence: Secondary | ICD-10-CM | POA: Diagnosis not present

## 2019-06-08 DIAGNOSIS — E119 Type 2 diabetes mellitus without complications: Secondary | ICD-10-CM | POA: Diagnosis not present

## 2019-06-10 DIAGNOSIS — Z89511 Acquired absence of right leg below knee: Secondary | ICD-10-CM | POA: Diagnosis not present

## 2019-06-10 DIAGNOSIS — E1151 Type 2 diabetes mellitus with diabetic peripheral angiopathy without gangrene: Secondary | ICD-10-CM | POA: Diagnosis not present

## 2019-06-10 DIAGNOSIS — M50122 Cervical disc disorder at C5-C6 level with radiculopathy: Secondary | ICD-10-CM | POA: Diagnosis not present

## 2019-06-10 DIAGNOSIS — I2510 Atherosclerotic heart disease of native coronary artery without angina pectoris: Secondary | ICD-10-CM | POA: Diagnosis not present

## 2019-06-10 DIAGNOSIS — I10 Essential (primary) hypertension: Secondary | ICD-10-CM | POA: Diagnosis not present

## 2019-06-10 DIAGNOSIS — E1142 Type 2 diabetes mellitus with diabetic polyneuropathy: Secondary | ICD-10-CM | POA: Diagnosis not present

## 2019-06-10 DIAGNOSIS — Z89612 Acquired absence of left leg above knee: Secondary | ICD-10-CM | POA: Diagnosis not present

## 2019-06-10 DIAGNOSIS — M4003 Postural kyphosis, cervicothoracic region: Secondary | ICD-10-CM | POA: Diagnosis not present

## 2019-06-10 DIAGNOSIS — M9902 Segmental and somatic dysfunction of thoracic region: Secondary | ICD-10-CM | POA: Diagnosis not present

## 2019-06-10 DIAGNOSIS — M9901 Segmental and somatic dysfunction of cervical region: Secondary | ICD-10-CM | POA: Diagnosis not present

## 2019-06-10 DIAGNOSIS — F17210 Nicotine dependence, cigarettes, uncomplicated: Secondary | ICD-10-CM | POA: Diagnosis not present

## 2019-06-10 DIAGNOSIS — I70209 Unspecified atherosclerosis of native arteries of extremities, unspecified extremity: Secondary | ICD-10-CM | POA: Diagnosis not present

## 2019-06-10 DIAGNOSIS — Z4781 Encounter for orthopedic aftercare following surgical amputation: Secondary | ICD-10-CM | POA: Diagnosis not present

## 2019-06-11 ENCOUNTER — Encounter (HOSPITAL_BASED_OUTPATIENT_CLINIC_OR_DEPARTMENT_OTHER): Payer: Medicare HMO | Attending: Internal Medicine

## 2019-06-11 DIAGNOSIS — L97812 Non-pressure chronic ulcer of other part of right lower leg with fat layer exposed: Secondary | ICD-10-CM | POA: Diagnosis not present

## 2019-06-11 DIAGNOSIS — Z89612 Acquired absence of left leg above knee: Secondary | ICD-10-CM | POA: Diagnosis not present

## 2019-06-11 DIAGNOSIS — Z89511 Acquired absence of right leg below knee: Secondary | ICD-10-CM | POA: Diagnosis not present

## 2019-06-11 DIAGNOSIS — E1140 Type 2 diabetes mellitus with diabetic neuropathy, unspecified: Secondary | ICD-10-CM | POA: Diagnosis not present

## 2019-06-11 DIAGNOSIS — I10 Essential (primary) hypertension: Secondary | ICD-10-CM | POA: Diagnosis not present

## 2019-06-11 DIAGNOSIS — N3946 Mixed incontinence: Secondary | ICD-10-CM | POA: Diagnosis not present

## 2019-06-11 DIAGNOSIS — E11622 Type 2 diabetes mellitus with other skin ulcer: Secondary | ICD-10-CM | POA: Diagnosis not present

## 2019-06-11 DIAGNOSIS — E1151 Type 2 diabetes mellitus with diabetic peripheral angiopathy without gangrene: Secondary | ICD-10-CM | POA: Insufficient documentation

## 2019-06-11 DIAGNOSIS — T8789 Other complications of amputation stump: Secondary | ICD-10-CM | POA: Diagnosis not present

## 2019-06-11 HISTORY — DX: Mixed incontinence: N39.46

## 2019-06-12 ENCOUNTER — Other Ambulatory Visit: Payer: Self-pay

## 2019-06-12 DIAGNOSIS — I70209 Unspecified atherosclerosis of native arteries of extremities, unspecified extremity: Secondary | ICD-10-CM | POA: Diagnosis not present

## 2019-06-12 DIAGNOSIS — Z89612 Acquired absence of left leg above knee: Secondary | ICD-10-CM | POA: Diagnosis not present

## 2019-06-12 DIAGNOSIS — E1142 Type 2 diabetes mellitus with diabetic polyneuropathy: Secondary | ICD-10-CM | POA: Diagnosis not present

## 2019-06-12 DIAGNOSIS — E1151 Type 2 diabetes mellitus with diabetic peripheral angiopathy without gangrene: Secondary | ICD-10-CM | POA: Diagnosis not present

## 2019-06-12 DIAGNOSIS — Z89511 Acquired absence of right leg below knee: Secondary | ICD-10-CM | POA: Diagnosis not present

## 2019-06-12 DIAGNOSIS — F17210 Nicotine dependence, cigarettes, uncomplicated: Secondary | ICD-10-CM | POA: Diagnosis not present

## 2019-06-12 DIAGNOSIS — Z4781 Encounter for orthopedic aftercare following surgical amputation: Secondary | ICD-10-CM | POA: Diagnosis not present

## 2019-06-12 DIAGNOSIS — I2510 Atherosclerotic heart disease of native coronary artery without angina pectoris: Secondary | ICD-10-CM | POA: Diagnosis not present

## 2019-06-12 DIAGNOSIS — I10 Essential (primary) hypertension: Secondary | ICD-10-CM | POA: Diagnosis not present

## 2019-06-16 DIAGNOSIS — M50122 Cervical disc disorder at C5-C6 level with radiculopathy: Secondary | ICD-10-CM | POA: Diagnosis not present

## 2019-06-16 DIAGNOSIS — M4003 Postural kyphosis, cervicothoracic region: Secondary | ICD-10-CM | POA: Diagnosis not present

## 2019-06-16 DIAGNOSIS — M9902 Segmental and somatic dysfunction of thoracic region: Secondary | ICD-10-CM | POA: Diagnosis not present

## 2019-06-16 DIAGNOSIS — M9901 Segmental and somatic dysfunction of cervical region: Secondary | ICD-10-CM | POA: Diagnosis not present

## 2019-06-17 DIAGNOSIS — M4003 Postural kyphosis, cervicothoracic region: Secondary | ICD-10-CM | POA: Diagnosis not present

## 2019-06-17 DIAGNOSIS — M9901 Segmental and somatic dysfunction of cervical region: Secondary | ICD-10-CM | POA: Diagnosis not present

## 2019-06-17 DIAGNOSIS — M9902 Segmental and somatic dysfunction of thoracic region: Secondary | ICD-10-CM | POA: Diagnosis not present

## 2019-06-17 DIAGNOSIS — M50122 Cervical disc disorder at C5-C6 level with radiculopathy: Secondary | ICD-10-CM | POA: Diagnosis not present

## 2019-06-18 DIAGNOSIS — I2510 Atherosclerotic heart disease of native coronary artery without angina pectoris: Secondary | ICD-10-CM | POA: Diagnosis not present

## 2019-06-18 DIAGNOSIS — N3946 Mixed incontinence: Secondary | ICD-10-CM | POA: Diagnosis not present

## 2019-06-18 DIAGNOSIS — E1151 Type 2 diabetes mellitus with diabetic peripheral angiopathy without gangrene: Secondary | ICD-10-CM | POA: Diagnosis not present

## 2019-06-18 DIAGNOSIS — M4003 Postural kyphosis, cervicothoracic region: Secondary | ICD-10-CM | POA: Diagnosis not present

## 2019-06-18 DIAGNOSIS — F17210 Nicotine dependence, cigarettes, uncomplicated: Secondary | ICD-10-CM | POA: Diagnosis not present

## 2019-06-18 DIAGNOSIS — N3941 Urge incontinence: Secondary | ICD-10-CM | POA: Diagnosis not present

## 2019-06-18 DIAGNOSIS — Z89511 Acquired absence of right leg below knee: Secondary | ICD-10-CM | POA: Diagnosis not present

## 2019-06-18 DIAGNOSIS — M50122 Cervical disc disorder at C5-C6 level with radiculopathy: Secondary | ICD-10-CM | POA: Diagnosis not present

## 2019-06-18 DIAGNOSIS — M9901 Segmental and somatic dysfunction of cervical region: Secondary | ICD-10-CM | POA: Diagnosis not present

## 2019-06-18 DIAGNOSIS — R8289 Other abnormal findings on cytological and histological examination of urine: Secondary | ICD-10-CM | POA: Diagnosis not present

## 2019-06-18 DIAGNOSIS — Z89612 Acquired absence of left leg above knee: Secondary | ICD-10-CM | POA: Diagnosis not present

## 2019-06-18 DIAGNOSIS — I10 Essential (primary) hypertension: Secondary | ICD-10-CM | POA: Diagnosis not present

## 2019-06-18 DIAGNOSIS — Z4781 Encounter for orthopedic aftercare following surgical amputation: Secondary | ICD-10-CM | POA: Diagnosis not present

## 2019-06-18 DIAGNOSIS — M9902 Segmental and somatic dysfunction of thoracic region: Secondary | ICD-10-CM | POA: Diagnosis not present

## 2019-06-18 DIAGNOSIS — I70209 Unspecified atherosclerosis of native arteries of extremities, unspecified extremity: Secondary | ICD-10-CM | POA: Diagnosis not present

## 2019-06-18 DIAGNOSIS — E1142 Type 2 diabetes mellitus with diabetic polyneuropathy: Secondary | ICD-10-CM | POA: Diagnosis not present

## 2019-06-19 DIAGNOSIS — Z89612 Acquired absence of left leg above knee: Secondary | ICD-10-CM | POA: Diagnosis not present

## 2019-06-19 DIAGNOSIS — I2510 Atherosclerotic heart disease of native coronary artery without angina pectoris: Secondary | ICD-10-CM | POA: Diagnosis not present

## 2019-06-19 DIAGNOSIS — I70209 Unspecified atherosclerosis of native arteries of extremities, unspecified extremity: Secondary | ICD-10-CM | POA: Diagnosis not present

## 2019-06-19 DIAGNOSIS — F17210 Nicotine dependence, cigarettes, uncomplicated: Secondary | ICD-10-CM | POA: Diagnosis not present

## 2019-06-19 DIAGNOSIS — E1151 Type 2 diabetes mellitus with diabetic peripheral angiopathy without gangrene: Secondary | ICD-10-CM | POA: Diagnosis not present

## 2019-06-19 DIAGNOSIS — Z89511 Acquired absence of right leg below knee: Secondary | ICD-10-CM | POA: Diagnosis not present

## 2019-06-19 DIAGNOSIS — E1142 Type 2 diabetes mellitus with diabetic polyneuropathy: Secondary | ICD-10-CM | POA: Diagnosis not present

## 2019-06-19 DIAGNOSIS — I10 Essential (primary) hypertension: Secondary | ICD-10-CM | POA: Diagnosis not present

## 2019-06-19 DIAGNOSIS — Z4781 Encounter for orthopedic aftercare following surgical amputation: Secondary | ICD-10-CM | POA: Diagnosis not present

## 2019-06-22 DIAGNOSIS — M4003 Postural kyphosis, cervicothoracic region: Secondary | ICD-10-CM | POA: Diagnosis not present

## 2019-06-22 DIAGNOSIS — M9902 Segmental and somatic dysfunction of thoracic region: Secondary | ICD-10-CM | POA: Diagnosis not present

## 2019-06-22 DIAGNOSIS — M9901 Segmental and somatic dysfunction of cervical region: Secondary | ICD-10-CM | POA: Diagnosis not present

## 2019-06-22 DIAGNOSIS — M50122 Cervical disc disorder at C5-C6 level with radiculopathy: Secondary | ICD-10-CM | POA: Diagnosis not present

## 2019-06-23 DIAGNOSIS — I2510 Atherosclerotic heart disease of native coronary artery without angina pectoris: Secondary | ICD-10-CM | POA: Diagnosis not present

## 2019-06-23 DIAGNOSIS — I10 Essential (primary) hypertension: Secondary | ICD-10-CM | POA: Diagnosis not present

## 2019-06-23 DIAGNOSIS — M4003 Postural kyphosis, cervicothoracic region: Secondary | ICD-10-CM | POA: Diagnosis not present

## 2019-06-23 DIAGNOSIS — E1151 Type 2 diabetes mellitus with diabetic peripheral angiopathy without gangrene: Secondary | ICD-10-CM | POA: Diagnosis not present

## 2019-06-23 DIAGNOSIS — M50122 Cervical disc disorder at C5-C6 level with radiculopathy: Secondary | ICD-10-CM | POA: Diagnosis not present

## 2019-06-23 DIAGNOSIS — Z4781 Encounter for orthopedic aftercare following surgical amputation: Secondary | ICD-10-CM | POA: Diagnosis not present

## 2019-06-23 DIAGNOSIS — M9901 Segmental and somatic dysfunction of cervical region: Secondary | ICD-10-CM | POA: Diagnosis not present

## 2019-06-23 DIAGNOSIS — I70209 Unspecified atherosclerosis of native arteries of extremities, unspecified extremity: Secondary | ICD-10-CM | POA: Diagnosis not present

## 2019-06-23 DIAGNOSIS — M9902 Segmental and somatic dysfunction of thoracic region: Secondary | ICD-10-CM | POA: Diagnosis not present

## 2019-06-23 DIAGNOSIS — Z89612 Acquired absence of left leg above knee: Secondary | ICD-10-CM | POA: Diagnosis not present

## 2019-06-23 DIAGNOSIS — E1142 Type 2 diabetes mellitus with diabetic polyneuropathy: Secondary | ICD-10-CM | POA: Diagnosis not present

## 2019-06-23 DIAGNOSIS — F17210 Nicotine dependence, cigarettes, uncomplicated: Secondary | ICD-10-CM | POA: Diagnosis not present

## 2019-06-23 DIAGNOSIS — Z89511 Acquired absence of right leg below knee: Secondary | ICD-10-CM | POA: Diagnosis not present

## 2019-06-24 DIAGNOSIS — I2510 Atherosclerotic heart disease of native coronary artery without angina pectoris: Secondary | ICD-10-CM | POA: Diagnosis not present

## 2019-06-24 DIAGNOSIS — Z794 Long term (current) use of insulin: Secondary | ICD-10-CM | POA: Diagnosis not present

## 2019-06-24 DIAGNOSIS — E1151 Type 2 diabetes mellitus with diabetic peripheral angiopathy without gangrene: Secondary | ICD-10-CM | POA: Diagnosis not present

## 2019-06-24 DIAGNOSIS — L97513 Non-pressure chronic ulcer of other part of right foot with necrosis of muscle: Secondary | ICD-10-CM | POA: Diagnosis not present

## 2019-06-24 DIAGNOSIS — E11621 Type 2 diabetes mellitus with foot ulcer: Secondary | ICD-10-CM | POA: Diagnosis not present

## 2019-06-24 DIAGNOSIS — Z4781 Encounter for orthopedic aftercare following surgical amputation: Secondary | ICD-10-CM | POA: Diagnosis not present

## 2019-06-24 DIAGNOSIS — L97523 Non-pressure chronic ulcer of other part of left foot with necrosis of muscle: Secondary | ICD-10-CM | POA: Diagnosis not present

## 2019-06-24 DIAGNOSIS — I10 Essential (primary) hypertension: Secondary | ICD-10-CM | POA: Diagnosis not present

## 2019-06-24 DIAGNOSIS — Z89612 Acquired absence of left leg above knee: Secondary | ICD-10-CM | POA: Diagnosis not present

## 2019-06-24 DIAGNOSIS — E1142 Type 2 diabetes mellitus with diabetic polyneuropathy: Secondary | ICD-10-CM | POA: Diagnosis not present

## 2019-06-24 DIAGNOSIS — I70209 Unspecified atherosclerosis of native arteries of extremities, unspecified extremity: Secondary | ICD-10-CM | POA: Diagnosis not present

## 2019-06-24 DIAGNOSIS — Z89511 Acquired absence of right leg below knee: Secondary | ICD-10-CM | POA: Diagnosis not present

## 2019-06-24 DIAGNOSIS — F17210 Nicotine dependence, cigarettes, uncomplicated: Secondary | ICD-10-CM | POA: Diagnosis not present

## 2019-06-29 DIAGNOSIS — M50122 Cervical disc disorder at C5-C6 level with radiculopathy: Secondary | ICD-10-CM | POA: Diagnosis not present

## 2019-06-29 DIAGNOSIS — M9902 Segmental and somatic dysfunction of thoracic region: Secondary | ICD-10-CM | POA: Diagnosis not present

## 2019-06-29 DIAGNOSIS — M9901 Segmental and somatic dysfunction of cervical region: Secondary | ICD-10-CM | POA: Diagnosis not present

## 2019-06-29 DIAGNOSIS — M4003 Postural kyphosis, cervicothoracic region: Secondary | ICD-10-CM | POA: Diagnosis not present

## 2019-06-30 DIAGNOSIS — E1140 Type 2 diabetes mellitus with diabetic neuropathy, unspecified: Secondary | ICD-10-CM | POA: Diagnosis not present

## 2019-06-30 DIAGNOSIS — Z89612 Acquired absence of left leg above knee: Secondary | ICD-10-CM | POA: Diagnosis not present

## 2019-06-30 DIAGNOSIS — E1151 Type 2 diabetes mellitus with diabetic peripheral angiopathy without gangrene: Secondary | ICD-10-CM | POA: Diagnosis not present

## 2019-06-30 DIAGNOSIS — Z89511 Acquired absence of right leg below knee: Secondary | ICD-10-CM | POA: Diagnosis not present

## 2019-06-30 DIAGNOSIS — T8789 Other complications of amputation stump: Secondary | ICD-10-CM | POA: Diagnosis not present

## 2019-06-30 DIAGNOSIS — L97812 Non-pressure chronic ulcer of other part of right lower leg with fat layer exposed: Secondary | ICD-10-CM | POA: Diagnosis not present

## 2019-06-30 DIAGNOSIS — I10 Essential (primary) hypertension: Secondary | ICD-10-CM | POA: Diagnosis not present

## 2019-06-30 DIAGNOSIS — E11622 Type 2 diabetes mellitus with other skin ulcer: Secondary | ICD-10-CM | POA: Diagnosis not present

## 2019-07-01 DIAGNOSIS — I10 Essential (primary) hypertension: Secondary | ICD-10-CM | POA: Diagnosis not present

## 2019-07-01 DIAGNOSIS — F17210 Nicotine dependence, cigarettes, uncomplicated: Secondary | ICD-10-CM | POA: Diagnosis not present

## 2019-07-01 DIAGNOSIS — Z89511 Acquired absence of right leg below knee: Secondary | ICD-10-CM | POA: Diagnosis not present

## 2019-07-01 DIAGNOSIS — M9901 Segmental and somatic dysfunction of cervical region: Secondary | ICD-10-CM | POA: Diagnosis not present

## 2019-07-01 DIAGNOSIS — E1151 Type 2 diabetes mellitus with diabetic peripheral angiopathy without gangrene: Secondary | ICD-10-CM | POA: Diagnosis not present

## 2019-07-01 DIAGNOSIS — M50122 Cervical disc disorder at C5-C6 level with radiculopathy: Secondary | ICD-10-CM | POA: Diagnosis not present

## 2019-07-01 DIAGNOSIS — Z4781 Encounter for orthopedic aftercare following surgical amputation: Secondary | ICD-10-CM | POA: Diagnosis not present

## 2019-07-01 DIAGNOSIS — I2510 Atherosclerotic heart disease of native coronary artery without angina pectoris: Secondary | ICD-10-CM | POA: Diagnosis not present

## 2019-07-01 DIAGNOSIS — I70209 Unspecified atherosclerosis of native arteries of extremities, unspecified extremity: Secondary | ICD-10-CM | POA: Diagnosis not present

## 2019-07-01 DIAGNOSIS — M9902 Segmental and somatic dysfunction of thoracic region: Secondary | ICD-10-CM | POA: Diagnosis not present

## 2019-07-01 DIAGNOSIS — Z89612 Acquired absence of left leg above knee: Secondary | ICD-10-CM | POA: Diagnosis not present

## 2019-07-01 DIAGNOSIS — E1142 Type 2 diabetes mellitus with diabetic polyneuropathy: Secondary | ICD-10-CM | POA: Diagnosis not present

## 2019-07-01 DIAGNOSIS — M4003 Postural kyphosis, cervicothoracic region: Secondary | ICD-10-CM | POA: Diagnosis not present

## 2019-07-02 DIAGNOSIS — M9901 Segmental and somatic dysfunction of cervical region: Secondary | ICD-10-CM | POA: Diagnosis not present

## 2019-07-02 DIAGNOSIS — L97523 Non-pressure chronic ulcer of other part of left foot with necrosis of muscle: Secondary | ICD-10-CM | POA: Diagnosis not present

## 2019-07-02 DIAGNOSIS — M50122 Cervical disc disorder at C5-C6 level with radiculopathy: Secondary | ICD-10-CM | POA: Diagnosis not present

## 2019-07-02 DIAGNOSIS — E11621 Type 2 diabetes mellitus with foot ulcer: Secondary | ICD-10-CM | POA: Diagnosis not present

## 2019-07-02 DIAGNOSIS — M4003 Postural kyphosis, cervicothoracic region: Secondary | ICD-10-CM | POA: Diagnosis not present

## 2019-07-02 DIAGNOSIS — L97513 Non-pressure chronic ulcer of other part of right foot with necrosis of muscle: Secondary | ICD-10-CM | POA: Diagnosis not present

## 2019-07-02 DIAGNOSIS — M9902 Segmental and somatic dysfunction of thoracic region: Secondary | ICD-10-CM | POA: Diagnosis not present

## 2019-07-02 DIAGNOSIS — Z794 Long term (current) use of insulin: Secondary | ICD-10-CM | POA: Diagnosis not present

## 2019-07-03 DIAGNOSIS — F17210 Nicotine dependence, cigarettes, uncomplicated: Secondary | ICD-10-CM | POA: Diagnosis not present

## 2019-07-03 DIAGNOSIS — I70209 Unspecified atherosclerosis of native arteries of extremities, unspecified extremity: Secondary | ICD-10-CM | POA: Diagnosis not present

## 2019-07-03 DIAGNOSIS — I2510 Atherosclerotic heart disease of native coronary artery without angina pectoris: Secondary | ICD-10-CM | POA: Diagnosis not present

## 2019-07-03 DIAGNOSIS — I10 Essential (primary) hypertension: Secondary | ICD-10-CM | POA: Diagnosis not present

## 2019-07-03 DIAGNOSIS — E1151 Type 2 diabetes mellitus with diabetic peripheral angiopathy without gangrene: Secondary | ICD-10-CM | POA: Diagnosis not present

## 2019-07-03 DIAGNOSIS — Z89612 Acquired absence of left leg above knee: Secondary | ICD-10-CM | POA: Diagnosis not present

## 2019-07-03 DIAGNOSIS — E1142 Type 2 diabetes mellitus with diabetic polyneuropathy: Secondary | ICD-10-CM | POA: Diagnosis not present

## 2019-07-03 DIAGNOSIS — Z4781 Encounter for orthopedic aftercare following surgical amputation: Secondary | ICD-10-CM | POA: Diagnosis not present

## 2019-07-03 DIAGNOSIS — Z89511 Acquired absence of right leg below knee: Secondary | ICD-10-CM | POA: Diagnosis not present

## 2019-07-06 DIAGNOSIS — M9901 Segmental and somatic dysfunction of cervical region: Secondary | ICD-10-CM | POA: Diagnosis not present

## 2019-07-06 DIAGNOSIS — M4003 Postural kyphosis, cervicothoracic region: Secondary | ICD-10-CM | POA: Diagnosis not present

## 2019-07-06 DIAGNOSIS — M50122 Cervical disc disorder at C5-C6 level with radiculopathy: Secondary | ICD-10-CM | POA: Diagnosis not present

## 2019-07-06 DIAGNOSIS — M9902 Segmental and somatic dysfunction of thoracic region: Secondary | ICD-10-CM | POA: Diagnosis not present

## 2019-07-08 DIAGNOSIS — Z4781 Encounter for orthopedic aftercare following surgical amputation: Secondary | ICD-10-CM | POA: Diagnosis not present

## 2019-07-08 DIAGNOSIS — E1151 Type 2 diabetes mellitus with diabetic peripheral angiopathy without gangrene: Secondary | ICD-10-CM | POA: Diagnosis not present

## 2019-07-08 DIAGNOSIS — I2510 Atherosclerotic heart disease of native coronary artery without angina pectoris: Secondary | ICD-10-CM | POA: Diagnosis not present

## 2019-07-08 DIAGNOSIS — I10 Essential (primary) hypertension: Secondary | ICD-10-CM | POA: Diagnosis not present

## 2019-07-08 DIAGNOSIS — Z89511 Acquired absence of right leg below knee: Secondary | ICD-10-CM | POA: Diagnosis not present

## 2019-07-08 DIAGNOSIS — I70209 Unspecified atherosclerosis of native arteries of extremities, unspecified extremity: Secondary | ICD-10-CM | POA: Diagnosis not present

## 2019-07-08 DIAGNOSIS — Z89612 Acquired absence of left leg above knee: Secondary | ICD-10-CM | POA: Diagnosis not present

## 2019-07-08 DIAGNOSIS — F17210 Nicotine dependence, cigarettes, uncomplicated: Secondary | ICD-10-CM | POA: Diagnosis not present

## 2019-07-08 DIAGNOSIS — E1142 Type 2 diabetes mellitus with diabetic polyneuropathy: Secondary | ICD-10-CM | POA: Diagnosis not present

## 2019-07-09 DIAGNOSIS — M50122 Cervical disc disorder at C5-C6 level with radiculopathy: Secondary | ICD-10-CM | POA: Diagnosis not present

## 2019-07-09 DIAGNOSIS — M4003 Postural kyphosis, cervicothoracic region: Secondary | ICD-10-CM | POA: Diagnosis not present

## 2019-07-09 DIAGNOSIS — M9902 Segmental and somatic dysfunction of thoracic region: Secondary | ICD-10-CM | POA: Diagnosis not present

## 2019-07-09 DIAGNOSIS — M9901 Segmental and somatic dysfunction of cervical region: Secondary | ICD-10-CM | POA: Diagnosis not present

## 2019-07-10 DIAGNOSIS — I10 Essential (primary) hypertension: Secondary | ICD-10-CM | POA: Diagnosis not present

## 2019-07-10 DIAGNOSIS — I70209 Unspecified atherosclerosis of native arteries of extremities, unspecified extremity: Secondary | ICD-10-CM | POA: Diagnosis not present

## 2019-07-10 DIAGNOSIS — E1151 Type 2 diabetes mellitus with diabetic peripheral angiopathy without gangrene: Secondary | ICD-10-CM | POA: Diagnosis not present

## 2019-07-10 DIAGNOSIS — Z4781 Encounter for orthopedic aftercare following surgical amputation: Secondary | ICD-10-CM | POA: Diagnosis not present

## 2019-07-10 DIAGNOSIS — Z89612 Acquired absence of left leg above knee: Secondary | ICD-10-CM | POA: Diagnosis not present

## 2019-07-10 DIAGNOSIS — I2510 Atherosclerotic heart disease of native coronary artery without angina pectoris: Secondary | ICD-10-CM | POA: Diagnosis not present

## 2019-07-10 DIAGNOSIS — F17210 Nicotine dependence, cigarettes, uncomplicated: Secondary | ICD-10-CM | POA: Diagnosis not present

## 2019-07-10 DIAGNOSIS — Z89511 Acquired absence of right leg below knee: Secondary | ICD-10-CM | POA: Diagnosis not present

## 2019-07-10 DIAGNOSIS — E1142 Type 2 diabetes mellitus with diabetic polyneuropathy: Secondary | ICD-10-CM | POA: Diagnosis not present

## 2019-07-13 DIAGNOSIS — M9901 Segmental and somatic dysfunction of cervical region: Secondary | ICD-10-CM | POA: Diagnosis not present

## 2019-07-13 DIAGNOSIS — M9902 Segmental and somatic dysfunction of thoracic region: Secondary | ICD-10-CM | POA: Diagnosis not present

## 2019-07-13 DIAGNOSIS — M50122 Cervical disc disorder at C5-C6 level with radiculopathy: Secondary | ICD-10-CM | POA: Diagnosis not present

## 2019-07-13 DIAGNOSIS — M4003 Postural kyphosis, cervicothoracic region: Secondary | ICD-10-CM | POA: Diagnosis not present

## 2019-07-14 ENCOUNTER — Other Ambulatory Visit: Payer: Self-pay

## 2019-07-14 ENCOUNTER — Other Ambulatory Visit: Payer: Self-pay | Admitting: Endocrinology

## 2019-07-14 ENCOUNTER — Encounter (HOSPITAL_BASED_OUTPATIENT_CLINIC_OR_DEPARTMENT_OTHER): Payer: Medicare HMO | Admitting: Internal Medicine

## 2019-07-14 DIAGNOSIS — E11622 Type 2 diabetes mellitus with other skin ulcer: Secondary | ICD-10-CM | POA: Diagnosis not present

## 2019-07-14 DIAGNOSIS — E1151 Type 2 diabetes mellitus with diabetic peripheral angiopathy without gangrene: Secondary | ICD-10-CM | POA: Diagnosis not present

## 2019-07-14 DIAGNOSIS — Z89612 Acquired absence of left leg above knee: Secondary | ICD-10-CM | POA: Diagnosis not present

## 2019-07-14 DIAGNOSIS — Z89511 Acquired absence of right leg below knee: Secondary | ICD-10-CM | POA: Diagnosis not present

## 2019-07-14 DIAGNOSIS — T8781 Dehiscence of amputation stump: Secondary | ICD-10-CM | POA: Diagnosis not present

## 2019-07-14 DIAGNOSIS — I10 Essential (primary) hypertension: Secondary | ICD-10-CM | POA: Insufficient documentation

## 2019-07-14 DIAGNOSIS — L97812 Non-pressure chronic ulcer of other part of right lower leg with fat layer exposed: Secondary | ICD-10-CM | POA: Insufficient documentation

## 2019-07-14 NOTE — Telephone Encounter (Signed)
Please advise 

## 2019-07-14 NOTE — Telephone Encounter (Signed)
1.  Please schedule f/u appt 2.  Then please refill x 1, pending that appt.  

## 2019-07-14 NOTE — Progress Notes (Signed)
[name], Danielle Buchanan ([id number]) Visit Report for 07/14/2019 Debridement Details Patient Name: Date of Service: Danielle Buchanan, Danielle Buchanan 07/14/2019 2:00 PM Medical Record [id number] Patient Account Number: [id number] Date of Birth/Sex: Treating RN: 17-Aug-1956 (63 y.o. Danielle Buchanan Primary Care Provider: Shirlean Buchanan Other Clinician: Karl Buchanan Referring Provider: Treating Provider/Extender:Danielle Buchanan, Danielle Buchanan, Danielle Buchanan in Treatment: 13 Debridement Performed for Wound #8 Right,Medial Amputation Site - Below Knee Assessment: Performed By: Physician Danielle Buchanan., MD Debridement Type: Debridement Level of Consciousness (Pre- Awake and Alert procedure): Pre-procedure Yes - 16:15 Verification/Time Out Taken: Start Time: 16:15 Pain Control: Lidocaine 5% topical ointment Total Area Debrided (L x W): 2.5 (cm) x 4 (cm) = 10 (cm) Tissue and other material Viable, Non-Viable, Slough, Subcutaneous, Slough debrided: Level: Skin/Subcutaneous Tissue Debridement Description: Excisional Instrument: Curette Bleeding: Moderate Hemostasis Achieved: Pressure End Time: 16:19 Procedural Pain: 0 Post Procedural Pain: 0 Response to Treatment: Procedure was tolerated well Level of Consciousness Awake and Alert (Post-procedure): Post Debridement Measurements of Total Wound Length: (cm) 2.5 Width: (cm) 4 Depth: (cm) 0.2 Volume: (cm) 1.571 Character of Wound/Ulcer Post Improved Debridement: Post Procedure Diagnosis Same as Pre-procedure Electronic Signature(s) Signed: 07/14/2019 5:29:39 PM By: Danielle Najjar MD Signed: 07/14/2019 6:04:54 PM By: Danielle Pax RN Entered By: Danielle Buchanan on 07/14/2019 16:30:55 -------------------------------------------------------------------------------- HPI Details Patient Name: Date of Service: Danielle Buchanan. 07/14/2019 2:00 PM Medical Record [id number] Patient Account Number: [id number] Date of Birth/Sex: Treating RN: 1956/02/16 (63  y.o. Danielle Buchanan Primary Care Provider: Shirlean Buchanan Other Clinician: Karl Buchanan Referring Provider: Treating Provider/Extender:Danielle Buchanan, Danielle Buchanan, Danielle Buchanan in Treatment: 13 History of Present Illness Location: right foot Quality: Patient reports experiencing a dull pain to affected area(s). Severity: Patient states wound(s) are getting worse. Duration: Patient has had the wound for > 3 months prior to seeking treatment at the wound center Timing: Pain in wound is constant (hurts all the time) Context: the original wound on the right foot is the same and she's recently had a left below-knee amputation Modifying Factors: Patient wound(s)/ulcer(s) are worsening due to :significant pain at rest HPI Description: 63 year old diabetic patient with a past medical history of essential hypertension, hyper lipidemia, PTSD, was seen recently and a walk-in clinic a week ago for pain and ulcers on the feet which she's had for 2 months. The diagnosis of foot cellulitis was made and the patient was put on Bactrim DS for 14 days along with Neurontin and pain medication and asked to see the wound center. The patient has been seen by the endocrinologist Danielle Buchanan and noted that she has had diabetes since 1992 and has polyneuropathy and peripheral arterial disease.She is a smoker and smokes about a pack of cigarettes a day. She was recently referred to a dermatologist. Most recently she had a lower arterial duplex study done which showed the patient had no evidence of significant right or left lower extremity arterial disease based on the ABI which was 1.09 on the right and 1.01 on the left with triphasic flow except the left anterior tibial which suggest arterial occlusive disease. The bilateral toe brachial indices were abnormal with the right being 0.23 and the left being 0.24. the patient also had an arterial duplex examination which showed plaque bilaterally in the common femoral  artery and the proximal superficial femoral artery and also in the popliteal artery and the right anterior tibial artery and the left posterior tibial artery. These tests were requested by her primary care physician Danielle Buchanan, but the  patient and her caregiver who was her nephew say they did not receive any call back regarding the test nor have they been told to go to a vascular surgeon. 06/19/2017 -- was seen by Danielle Buchanan who has scheduled her for a aortogram and runoff on 06/24/2017-- he suspects this is going to be microvascular disease in addition to her diabetic ulcers. The benefit from an angiogram may be just as a baseline study to see if there is any pedal work that can be done to help improve the blood flow. He also discussed that she may lose her toes or have more proximal amputation of her limbs. She was recently admitted to the hospital between 06/09/2017 and 06/11/2017 with uncontrolled diabetes mellitus type 2, hypertension, nicotine dependence, worsening leg leg pain. She was treated appropriately for cellulitis she was sent home on Clindamycin 300 mg 3 times a day. Left foot x-ray done during this admission showed no radiographic evidence of osteomyelitis. Last hemoglobin A1c was 11.6% She has given up smoking for the last week. 06/26/2017 -- she was seen by Danielle Buchanan recently and an angiogram was done and found that she does not have any options for reconstruction but does have flow bilaterally to the ankles. He was hopeful that with wound care and smoking sensation she could heal her wounds. Procedure performed on 06/24/2017 was a aortogram with bilateral lower extremity runoff. The findings were that of a patent aortobiiliac segments with a 30% stenosis of the left SFA and otherwise patent runoff to the popliteal arteries bilaterally. Predominant runoff on the right is via the peroneal artery with not much flow to the right foot. On the left side dominant runoff is  via the posterior tibial artery and there is also peroneal artery. There is no appreciable plantar arch on the left either. No intervention was undertaken. 07/17/2017 -- the initial paperwork to [facility] has denied her treatment for hyperbaric oxygen therapy. We will re- file the request. In the meanwhile she is scheduled to see Danielle Buchanan on Friday for further discussions regarding amputation of her left forefoot. she was in the ER on 07/12/2017 and I have reviewed these notes including the workup and the treatment plan and I agree with this. 08/21/2017 -- the patient was in and out of the hospital after a revision surgery on 08/12/2017 where she underwent a revision left below-knee amputation after she had a fall and distracted the previous amputation site. Her first BKA on that side was done on 07/23/2017. Further amount of necrotic tissue and bone was resected and primary closure was performed. she was discharged home on 08/15/2017 the patient is been using Santyl ointment on the right foot and the left below-knee amputation site is under the care of her surgeon Danielle Buchanan. She says her blood glucose control has been good and she still not smoking READMISSION 04/09/2019 This is a now 63 year old woman who we cared for in this clinic in 2018 predominantly with a left foot wound. She ultimately had a left BKA and a left U KA. More recently she developed wounds on her feet on the right. She underwent a right BKA on 01/19/2019 for gangrene of the right foot. Her nephew is present tells me that the staples were removed about 2 weeks after that and she had Steri-Strips placed. They noted some drainage under the Steri- Strips and some black discoloration. This reopened with a fairly large wound on the medial aspect of the stump incision and a smaller  eschar laterally. Looking through the notes there may have been a stump infection in April as well. They have been using wet-to-dry dressings to this  area twice daily. They are referred here by vein and vascular Dr. Darrick Penna for our review. Past medical history includes type 2 diabetes with a history of polyneuropathy and PAD. Last hemoglobin A1c at 8.5. Hypertension hyperlipidemia, coronary artery disease, continued tobacco abuse at 1/2 pack/day and some degree of memory decline is listed on her problem list Not possible to do ABIs of course however it is listed that it 1.2 years ago the angiogram suggest that there was enough blood flow down to the level of the ankle. She has had a angioplasty of her right posterior tibial artery and the right tibioperoneal trunk in February 2020 and then a right transmetatarsal amputation in February 2020 before the below-knee amputation. I do not see specifically the angiogram which I will need to look up at a later time. Social history; patient lives alone although she has help from family for her dressings. She has a continued half a pack per day smoker 7/16; difficult wound on the right BKA stump. There is a large wound anteriorly and laterally a small satellite area. We have been using Santyl. She tells me she is got her cigarette smoking down to 4 cigarettes a day 7/23; difficult wounds on the right BKA stump. There is a large wound anteriorly and medially and a smaller area laterally. We have been using Santyl. Family is changing this daily. Still smoking 4 cigarettes a day. 7/30; no real improvement in the area on the right BKA stump. There is a large wound anterior and medially and a much smaller wound laterally. Both of these have necrotic surface is certainly not viable over the large wound. Been using Santyl for several weeks without real improvement. Changed to Iodoflex today 8/13-Is back at 2 weeks, the wound of the right BKA stump looks about the same perhaps marginally better, with some granulation tissue formation, we are using Iodoflex. The much smaller lateral wound is healed 9/3; the right  BKA stump wound is slightly smaller. Surface quite a bit better. She has palpable popliteal pulses. We have been using Iodoflex. Her nephew to helps her with the dressings and washing of the wound I think they have made some progress 9/22; the right BKA stump is slightly smaller. Probably 80% of this as healthy granulation now. Debrided with Anasept and gauze no mechanical debridement. We have been ordering Iodoflex but apparently getting [facility] alg[facility] from [facility] who apparently do not have the Iodoflex 10/6; right BKA stump 2-week follow-up. We have been using Iodoflex but [facility] is providing an iodine-based polymer foam dressing called Iodoplex. These may not be comparable. They got a very large supply of this at home. Electronic Signature(s) Signed: 07/14/2019 5:29:39 PM By: Danielle Najjar MD Entered By: Danielle Buchanan on 07/14/2019 16:29:15 -------------------------------------------------------------------------------- Physical Exam Details Patient Name: Date of Service: Danielle Buchanan. 07/14/2019 2:00 PM Medical Record [id number] Patient Account Number: [id number] Date of Birth/Sex: Treating RN: 01/22/1956 (63 y.o. Danielle Buchanan Primary Care Provider: Shirlean Buchanan Other Clinician: Karl Buchanan Referring Provider: Treating Provider/Extender:Denisha Hoel, Danielle Buchanan, Danielle Buchanan in Treatment: 13 Notes Wound exam; BKA stump. Under illumination a nonviable surface. #5 curette debridement of tightly adherent fibrinous debris. Post debridement the wound looks very healthy. No evidence of surrounding infection Electronic Signature(s) Signed: 07/14/2019 5:29:39 PM By: Danielle Najjar MD Entered By: Danielle Buchanan on 07/14/2019  16:29:51 -------------------------------------------------------------------------------- Physician Orders Details Patient Name: Date of Service: Danielle Buchanan, Danielle Buchanan 07/14/2019 2:00 PM Medical Record [id number] Patient  Account Number: [id number] Date of Birth/Sex: Treating RN: Aug 20, 1956 (63 y.o. Danielle Buchanan Primary Care Provider: Shirlean Buchanan Other Clinician: Karl Buchanan Referring Provider: Treating Provider/Extender:Micayla Brathwaite, Danielle Buchanan, Danielle Buchanan in Treatment: 13 Verbal / Phone Orders: No Diagnosis Coding ICD-10 Coding Code Description E11.51 Type 2 diabetes mellitus with diabetic peripheral angiopathy without gangrene E11.622 Type 2 diabetes mellitus with other skin ulcer L97.818 Non-pressure chronic ulcer of other part of right lower leg with other specified severity Z89.511 Acquired absence of right leg below knee Z89.612 Acquired absence of left leg above knee Follow-up Appointments Return Appointment in 2 weeks. Dressing Change Frequency Change Dressing every other day. - home health to change twice a week. Wound Cleansing May shower and wash wound with soap and water. Primary Wound Dressing Wound #8 Right,Medial Amputation Site - Below Knee Other: - Ioplex Secondary Dressing Wound #8 Right,Medial Amputation Site - Below Knee Kerlix/Rolled Gauze ABD pad Edema Control Elevate legs to the level of the heart or above for 30 minutes daily and/or when sitting, a frequency of: - 3-4 a day to aid in swelling. Additional Orders / Instructions Stop/Decrease Smoking Follow Nutritious Diet - increase protein and vegetables within diabetic diet. Home Health Continue Home Health skilled nursing for wound care. - [facility]. Electronic Signature(s) Signed: 07/14/2019 5:29:39 PM By: Danielle Najjar MD Signed: 07/14/2019 6:04:54 PM By: Danielle Pax RN Entered By: Danielle Buchanan on 07/14/2019 16:19:53 -------------------------------------------------------------------------------- Problem List Details Patient Name: Date of Service: Danielle Buchanan. 07/14/2019 2:00 PM Medical Record [id number] Patient Account Number: [id number] Date of Birth/Sex: Treating RN: [birth year] (63  y.o. Danielle Buchanan Primary Care Provider: Shirlean Buchanan Other Clinician: Karl Buchanan Referring Provider: Treating Provider/Extender:Shulamit Donofrio, Danielle Buchanan, Danielle Buchanan in Treatment: 13 Active Problems ICD-10 Evaluated Encounter Code Description Active Date Today Diagnosis E11.51 Type 2 diabetes mellitus with diabetic peripheral 04/09/2019 No Yes angiopathy without gangrene E11.622 Type 2 diabetes mellitus with other skin ulcer 04/09/2019 No Yes L97.818 Non-pressure chronic ulcer of other part of right lower 04/09/2019 No Yes leg with other specified severity Z89.511 Acquired absence of right leg below knee 04/09/2019 No Yes Z89.612 Acquired absence of left leg above knee 04/09/2019 No Yes Inactive Problems Resolved Problems Electronic Signature(s) Signed: 07/14/2019 5:29:39 PM By: Danielle Najjar MD Entered By: Danielle Buchanan on 07/14/2019 16:28:07 -------------------------------------------------------------------------------- Progress Note Details Patient Name: Date of Service: Danielle Buchanan. 07/14/2019 2:00 PM Medical Record [id number] Patient Account Number: [id number] Date of Birth/Sex: Treating RN: 1956-07-12 (63 y.o. Danielle Buchanan Primary Care Provider: Shirlean Buchanan Other Clinician: Karl Buchanan Referring Provider: Treating Provider/Extender:Jermya Dowding, Danielle Buchanan, Danielle Buchanan in Treatment: 13 Subjective History of Present Illness (HPI) The following HPI elements were documented for the patient's wound: Location: right foot Quality: Patient reports experiencing a dull pain to affected area(s). Severity: Patient states wound(s) are getting worse. Duration: Patient has had the wound for > 3 months prior to seeking treatment at the wound center Timing: Pain in wound is constant (hurts all the time) Context: the original wound on the right foot is the same and she's recently had a left below-knee amputation Modifying Factors: Patient wound(s)/ulcer(s) are worsening  due to :significant pain at rest 63 year old diabetic patient with a past medical history of essential hypertension, hyper lipidemia, PTSD, was seen recently and a walk-in clinic a week ago for pain and ulcers on the feet which she's had  for 2 months. The diagnosis of foot cellulitis was made and the patient was put on Bactrim DS for 14 days along with Neurontin and pain medication and asked to see the wound center. The patient has been seen by the endocrinologist Danielle Buchanan and noted that she has had diabetes since 1992 and has polyneuropathy and peripheral arterial disease.She is a smoker and smokes about a pack of cigarettes a day. She was recently referred to a dermatologist. Most recently she had a lower arterial duplex study done which showed the patient had no evidence of significant right or left lower extremity arterial disease based on the ABI which was 1.09 on the right and 1.01 on the left with triphasic flow except the left anterior tibial which suggest arterial occlusive disease. The bilateral toe brachial indices were abnormal with the right being 0.23 and the left being 0.24. the patient also had an arterial duplex examination which showed plaque bilaterally in the common femoral artery and the proximal superficial femoral artery and also in the popliteal artery and the right anterior tibial artery and the left posterior tibial artery. These tests were requested by her primary care physician Danielle Buchanan, but the patient and her caregiver who was her nephew say they did not receive any call back regarding the test nor have they been told to go to a vascular surgeon. 06/19/2017 -- was seen by [name] who has scheduled her for a aortogram and runoff on 06/24/2017-- he suspects this is going to be microvascular disease in addition to her diabetic ulcers. The benefit from an angiogram may be just as a baseline study to see if there is any pedal work that can be done to help  improve the blood flow. He also discussed that she may lose her toes or have more proximal amputation of her limbs. She was recently admitted to the hospital between 06/09/2017 and 06/11/2017 with uncontrolled diabetes mellitus type 2, hypertension, nicotine dependence, worsening leg leg pain. She was treated appropriately for cellulitis she was sent home on Clindamycin 300 mg 3 times a day. Left foot x-ray done during this admission showed no radiographic evidence of osteomyelitis. Last hemoglobin A1c was 11.6% She has given up smoking for the last week. 06/26/2017 -- she was seen by [name] recently and an angiogram was done and found that she does not have any options for reconstruction but does have flow bilaterally to the ankles. He was hopeful that with wound care and smoking sensation she could heal her wounds. Procedure performed on 06/24/2017 was a aortogram with bilateral lower extremity runoff. The findings were that of a patent aortobiiliac segments with a 30% stenosis of the left SFA and otherwise patent runoff to the popliteal arteries bilaterally. Predominant runoff on the right is via the peroneal artery with not much flow to the right foot. On the left side dominant runoff is via the posterior tibial artery and there is also peroneal artery. There is no appreciable plantar arch on the left either. No intervention was undertaken. 07/17/2017 -- the initial paperwork to [facility] has denied her treatment for hyperbaric oxygen therapy. We will re- file the request. In the meanwhile she is scheduled to see [name] on Friday for further discussions regarding amputation of her left forefoot. she was in the ER on 07/12/2017 and I have reviewed these notes including the workup and the treatment plan and I agree with this. 08/21/2017 -- the patient was in and out of the  hospital after a revision surgery on 08/12/2017 where she underwent a revision left below-knee amputation after  she had a fall and distracted the previous amputation site. Her first BKA on that side was done on 07/23/2017. Further amount of necrotic tissue and bone was resected and primary closure was performed. she was discharged home on 08/15/2017 the patient is been using Santyl ointment on the right foot and the left below-knee amputation site is under the care of her surgeon [name]. She says her blood glucose control has been good and she still not smoking READMISSION 04/09/2019 This is a now 63 year old woman who we cared for in this clinic in 2018 predominantly with a left foot wound. She ultimately had a left BKA and a left U KA. More recently she developed wounds on her feet on the right. She underwent a right BKA on 01/19/2019 for gangrene of the right foot. Her nephew is present tells me that the staples were removed about 2 weeks after that and she had Steri-Strips placed. They noted some drainage under the Steri- Strips and some black discoloration. This reopened with a fairly large wound on the medial aspect of the stump incision and a smaller eschar laterally. Looking through the notes there may have been a stump infection in April as well. They have been using wet-to-dry dressings to this area twice daily. They are referred here by vein and vascular Dr. Darrick Penna for our review. Past medical history includes type 2 diabetes with a history of polyneuropathy and PAD. Last hemoglobin A1c at 8.5. Hypertension hyperlipidemia, coronary artery disease, continued tobacco abuse at 1/2 pack/day and some degree of memory decline is listed on her problem list Not possible to do ABIs of course however it is listed that it 1.2 years ago the angiogram suggest that there was enough blood flow down to the level of the ankle. She has had a angioplasty of her right posterior tibial artery and the right tibioperoneal trunk in February 2020 and then a right transmetatarsal amputation in February 2020  before the below-knee amputation. I do not see specifically the angiogram which I will need to look up at a later time. Social history; patient lives alone although she has help from family for her dressings. She has a continued half a pack per day smoker 7/16; difficult wound on the right BKA stump. There is a large wound anteriorly and laterally a small satellite area. We have been using Santyl. She tells me she is got her cigarette smoking down to 4 cigarettes a day 7/23; difficult wounds on the right BKA stump. There is a large wound anteriorly and medially and a smaller area laterally. We have been using Santyl. Family is changing this daily. Still smoking 4 cigarettes a day. 7/30; no real improvement in the area on the right BKA stump. There is a large wound anterior and medially and a much smaller wound laterally. Both of these have necrotic surface is certainly not viable over the large wound. Been using Santyl for several weeks without real improvement. Changed to Iodoflex today 8/13-Is back at 2 weeks, the wound of the right BKA stump looks about the same perhaps marginally better, with some granulation tissue formation, we are using Iodoflex. The much smaller lateral wound is healed 9/3; the right BKA stump wound is slightly smaller. Surface quite a bit better. She has palpable popliteal pulses. We have been using Iodoflex. Her nephew to helps her with the dressings and washing of the wound I  think they have made some progress 9/22; the right BKA stump is slightly smaller. Probably 80% of this as healthy granulation now. Debrided with Anasept and gauze no mechanical debridement. We have been ordering Iodoflex but apparently getting [facility] who apparently do not have the Iodoflex 10/6; right BKA stump 2-week follow-up. We have been using Iodoflex but [facility] is providing an iodine-based polymer foam dressing called Iodoplex. These may not be  comparable. They got a very large supply of this at home. Objective Constitutional Vitals Time Taken: 3:00 PM, Height: 62 in, Weight: 190 lbs, BMI: 34.7, Temperature: 98.4 F, Pulse: 68 bpm, Respiratory Rate: 18 breaths/min, Blood Pressure: 117/55 mmHg, Capillary Blood Glucose: 96 mg/dl. General Notes: patient stated last CBG was 96 Integumentary (Hair, Skin) Wound #8 status is Open. Original cause of wound was Surgical Injury. The wound is located on the Right,Medial Amputation Site - Below Knee. The wound measures 2.5cm length x 4cm width x 0.2cm depth; 7.854cm^2 area and 1.571cm^3 volume. There is Fat Layer (Subcutaneous Tissue) Exposed exposed. There is no undermining noted, however, there is tunneling at 11:00 with a maximum distance of 1.1cm. There is a medium amount of purulent drainage noted. The wound margin is flat and intact. There is medium (34-66%) red granulation within the wound bed. There is a medium (34-66%) amount of necrotic tissue within the wound bed including Adherent Slough. 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 Dressing Change Frequency: Change Dressing every other day. - home health to change twice a week. Wound Cleansing: May shower and wash wound with soap and water. Primary Wound Dressing: Wound #8 Right,Medial Amputation Site - Below Knee: Other: - Ioplex Secondary Dressing: Wound #8 Right,Medial Amputation Site - Below Knee: Kerlix/Rolled Gauze ABD pad Edema Control: Elevate legs to the level of the heart or above for 30 minutes daily and/or when sitting, a frequency of: - 3-4 a day to aid in  swelling. Additional Orders / Instructions: Stop/Decrease Smoking Follow Nutritious Diet - increase protein and vegetables within diabetic diet. Home Health: Continue Home Health skilled nursing for wound care. - [facility]. 1. I am continuing with Ioplex since he just got a large supply of this 2. The wound really does not look too bad although the dimensions are about the same. I was planning to change possibly to [facility] this week however since we have had such trouble getting supplies on this wound I left what they had just received on the wound Electronic Signature(s) Signed: 07/14/2019 5:29:39 PM By: Danielle Najjar MD Entered By: Danielle Buchanan on 07/14/2019 16:31:30 -------------------------------------------------------------------------------- SuperBill Details Patient Name: Date of Service: Danielle Buchanan 07/14/2019 Medical Record [id number] Patient Account Number: [id number] Date of Birth/Sex: Treating RN: 12-08-1955 (63 y.o. Danielle Buchanan Primary Care Provider: Shirlean Buchanan Other Clinician: Karl Buchanan Referring Provider: Treating Provider/Extender:Melyssa Signor, Danielle Buchanan, Danielle Buchanan in Treatment: 13 Diagnosis Coding ICD-10 Codes Code Description E11.51 Type 2 diabetes mellitus with diabetic peripheral angiopathy without gangrene E11.622 Type 2 diabetes mellitus with other skin ulcer L97.818 Non-pressure chronic ulcer of other part of right lower leg with other specified severity Z89.511 Acquired absence of right leg below knee Z89.612 Acquired  absence of left leg above knee Facility Procedures The patient participates with Medicare or their insurance follows the Medicare Facility Guidelines: CPT4 Code Description Modifier Quantity [id number] 11042 - DEB SUBQ TISSUE 20 SQ CM/< 1 ICD-10 Diagnosis Description L97.818 Non-pressure chronic ulcer of  other part of right lower leg with other specified severity Physician Procedures CPT4 Code  Description: [id number] 11042 - WC PHYS SUBQ TISS 20 SQ CM ICD-10 Diagnosis Description L97.818 Non-pressure chronic ulcer of other part of right lower leg severity Modifier: with other sp Quantity: 1 ecified Electronic Signature(s) Signed: 07/14/2019 5:29:39 PM By: Danielle Najjarobson, Kielyn Kardell MD Entered By: Danielle Najjarobson, Ambrosia Wisnewski on 07/14/2019 16:31:47

## 2019-07-15 DIAGNOSIS — M9901 Segmental and somatic dysfunction of cervical region: Secondary | ICD-10-CM | POA: Diagnosis not present

## 2019-07-15 DIAGNOSIS — E1151 Type 2 diabetes mellitus with diabetic peripheral angiopathy without gangrene: Secondary | ICD-10-CM | POA: Diagnosis not present

## 2019-07-15 DIAGNOSIS — M9902 Segmental and somatic dysfunction of thoracic region: Secondary | ICD-10-CM | POA: Diagnosis not present

## 2019-07-15 DIAGNOSIS — I2510 Atherosclerotic heart disease of native coronary artery without angina pectoris: Secondary | ICD-10-CM | POA: Diagnosis not present

## 2019-07-15 DIAGNOSIS — Z4781 Encounter for orthopedic aftercare following surgical amputation: Secondary | ICD-10-CM | POA: Diagnosis not present

## 2019-07-15 DIAGNOSIS — Z89612 Acquired absence of left leg above knee: Secondary | ICD-10-CM | POA: Diagnosis not present

## 2019-07-15 DIAGNOSIS — Z89511 Acquired absence of right leg below knee: Secondary | ICD-10-CM | POA: Diagnosis not present

## 2019-07-15 DIAGNOSIS — I70209 Unspecified atherosclerosis of native arteries of extremities, unspecified extremity: Secondary | ICD-10-CM | POA: Diagnosis not present

## 2019-07-15 DIAGNOSIS — M50122 Cervical disc disorder at C5-C6 level with radiculopathy: Secondary | ICD-10-CM | POA: Diagnosis not present

## 2019-07-15 DIAGNOSIS — E1142 Type 2 diabetes mellitus with diabetic polyneuropathy: Secondary | ICD-10-CM | POA: Diagnosis not present

## 2019-07-15 DIAGNOSIS — M4003 Postural kyphosis, cervicothoracic region: Secondary | ICD-10-CM | POA: Diagnosis not present

## 2019-07-15 DIAGNOSIS — F17210 Nicotine dependence, cigarettes, uncomplicated: Secondary | ICD-10-CM | POA: Diagnosis not present

## 2019-07-15 DIAGNOSIS — I10 Essential (primary) hypertension: Secondary | ICD-10-CM | POA: Diagnosis not present

## 2019-07-15 NOTE — Telephone Encounter (Signed)
LVM to Schedule °

## 2019-07-15 NOTE — Telephone Encounter (Signed)
Per Dr. Ellison, unable to refill Novolin 70/30 without an appt. Routing this message to the front desk for scheduling purposes.  

## 2019-07-16 DIAGNOSIS — M9901 Segmental and somatic dysfunction of cervical region: Secondary | ICD-10-CM | POA: Diagnosis not present

## 2019-07-16 DIAGNOSIS — M50122 Cervical disc disorder at C5-C6 level with radiculopathy: Secondary | ICD-10-CM | POA: Diagnosis not present

## 2019-07-16 DIAGNOSIS — M9902 Segmental and somatic dysfunction of thoracic region: Secondary | ICD-10-CM | POA: Diagnosis not present

## 2019-07-16 DIAGNOSIS — M4003 Postural kyphosis, cervicothoracic region: Secondary | ICD-10-CM | POA: Diagnosis not present

## 2019-07-17 DIAGNOSIS — E1142 Type 2 diabetes mellitus with diabetic polyneuropathy: Secondary | ICD-10-CM | POA: Diagnosis not present

## 2019-07-17 DIAGNOSIS — Z89612 Acquired absence of left leg above knee: Secondary | ICD-10-CM | POA: Diagnosis not present

## 2019-07-17 DIAGNOSIS — F17210 Nicotine dependence, cigarettes, uncomplicated: Secondary | ICD-10-CM | POA: Diagnosis not present

## 2019-07-17 DIAGNOSIS — I2510 Atherosclerotic heart disease of native coronary artery without angina pectoris: Secondary | ICD-10-CM | POA: Diagnosis not present

## 2019-07-17 DIAGNOSIS — E1151 Type 2 diabetes mellitus with diabetic peripheral angiopathy without gangrene: Secondary | ICD-10-CM | POA: Diagnosis not present

## 2019-07-17 DIAGNOSIS — Z89511 Acquired absence of right leg below knee: Secondary | ICD-10-CM | POA: Diagnosis not present

## 2019-07-17 DIAGNOSIS — I70209 Unspecified atherosclerosis of native arteries of extremities, unspecified extremity: Secondary | ICD-10-CM | POA: Diagnosis not present

## 2019-07-17 DIAGNOSIS — I10 Essential (primary) hypertension: Secondary | ICD-10-CM | POA: Diagnosis not present

## 2019-07-17 DIAGNOSIS — Z4781 Encounter for orthopedic aftercare following surgical amputation: Secondary | ICD-10-CM | POA: Diagnosis not present

## 2019-07-20 NOTE — Telephone Encounter (Signed)
LMTCB (x2) and schedule

## 2019-07-21 DIAGNOSIS — M50122 Cervical disc disorder at C5-C6 level with radiculopathy: Secondary | ICD-10-CM | POA: Diagnosis not present

## 2019-07-21 DIAGNOSIS — M4003 Postural kyphosis, cervicothoracic region: Secondary | ICD-10-CM | POA: Diagnosis not present

## 2019-07-21 DIAGNOSIS — M9901 Segmental and somatic dysfunction of cervical region: Secondary | ICD-10-CM | POA: Diagnosis not present

## 2019-07-21 DIAGNOSIS — M9902 Segmental and somatic dysfunction of thoracic region: Secondary | ICD-10-CM | POA: Diagnosis not present

## 2019-07-22 DIAGNOSIS — Z89511 Acquired absence of right leg below knee: Secondary | ICD-10-CM | POA: Diagnosis not present

## 2019-07-22 DIAGNOSIS — Z89612 Acquired absence of left leg above knee: Secondary | ICD-10-CM | POA: Diagnosis not present

## 2019-07-22 DIAGNOSIS — E1151 Type 2 diabetes mellitus with diabetic peripheral angiopathy without gangrene: Secondary | ICD-10-CM | POA: Diagnosis not present

## 2019-07-22 DIAGNOSIS — Z4781 Encounter for orthopedic aftercare following surgical amputation: Secondary | ICD-10-CM | POA: Diagnosis not present

## 2019-07-22 DIAGNOSIS — I2510 Atherosclerotic heart disease of native coronary artery without angina pectoris: Secondary | ICD-10-CM | POA: Diagnosis not present

## 2019-07-22 DIAGNOSIS — I10 Essential (primary) hypertension: Secondary | ICD-10-CM | POA: Diagnosis not present

## 2019-07-22 DIAGNOSIS — E1142 Type 2 diabetes mellitus with diabetic polyneuropathy: Secondary | ICD-10-CM | POA: Diagnosis not present

## 2019-07-22 DIAGNOSIS — F17210 Nicotine dependence, cigarettes, uncomplicated: Secondary | ICD-10-CM | POA: Diagnosis not present

## 2019-07-22 DIAGNOSIS — I70209 Unspecified atherosclerosis of native arteries of extremities, unspecified extremity: Secondary | ICD-10-CM | POA: Diagnosis not present

## 2019-07-22 NOTE — Telephone Encounter (Signed)
Letter sent.

## 2019-07-23 DIAGNOSIS — M9902 Segmental and somatic dysfunction of thoracic region: Secondary | ICD-10-CM | POA: Diagnosis not present

## 2019-07-23 DIAGNOSIS — M50122 Cervical disc disorder at C5-C6 level with radiculopathy: Secondary | ICD-10-CM | POA: Diagnosis not present

## 2019-07-23 DIAGNOSIS — M4003 Postural kyphosis, cervicothoracic region: Secondary | ICD-10-CM | POA: Diagnosis not present

## 2019-07-23 DIAGNOSIS — M9901 Segmental and somatic dysfunction of cervical region: Secondary | ICD-10-CM | POA: Diagnosis not present

## 2019-07-27 DIAGNOSIS — M9902 Segmental and somatic dysfunction of thoracic region: Secondary | ICD-10-CM | POA: Diagnosis not present

## 2019-07-27 DIAGNOSIS — M9901 Segmental and somatic dysfunction of cervical region: Secondary | ICD-10-CM | POA: Diagnosis not present

## 2019-07-27 DIAGNOSIS — R300 Dysuria: Secondary | ICD-10-CM | POA: Diagnosis not present

## 2019-07-27 DIAGNOSIS — N3941 Urge incontinence: Secondary | ICD-10-CM | POA: Diagnosis not present

## 2019-07-27 DIAGNOSIS — R350 Frequency of micturition: Secondary | ICD-10-CM | POA: Diagnosis not present

## 2019-07-27 DIAGNOSIS — M50122 Cervical disc disorder at C5-C6 level with radiculopathy: Secondary | ICD-10-CM | POA: Diagnosis not present

## 2019-07-27 DIAGNOSIS — M4003 Postural kyphosis, cervicothoracic region: Secondary | ICD-10-CM | POA: Diagnosis not present

## 2019-07-27 DIAGNOSIS — R3915 Urgency of urination: Secondary | ICD-10-CM | POA: Diagnosis not present

## 2019-07-28 ENCOUNTER — Encounter (HOSPITAL_BASED_OUTPATIENT_CLINIC_OR_DEPARTMENT_OTHER): Payer: Medicare HMO | Attending: Internal Medicine | Admitting: Internal Medicine

## 2019-07-28 ENCOUNTER — Other Ambulatory Visit: Payer: Self-pay

## 2019-07-28 DIAGNOSIS — L97812 Non-pressure chronic ulcer of other part of right lower leg with fat layer exposed: Secondary | ICD-10-CM | POA: Diagnosis not present

## 2019-07-28 DIAGNOSIS — I10 Essential (primary) hypertension: Secondary | ICD-10-CM | POA: Diagnosis not present

## 2019-07-28 DIAGNOSIS — Z89511 Acquired absence of right leg below knee: Secondary | ICD-10-CM | POA: Diagnosis not present

## 2019-07-28 DIAGNOSIS — Z89612 Acquired absence of left leg above knee: Secondary | ICD-10-CM | POA: Diagnosis not present

## 2019-07-28 DIAGNOSIS — E11622 Type 2 diabetes mellitus with other skin ulcer: Secondary | ICD-10-CM | POA: Diagnosis not present

## 2019-07-28 DIAGNOSIS — E1151 Type 2 diabetes mellitus with diabetic peripheral angiopathy without gangrene: Secondary | ICD-10-CM | POA: Diagnosis not present

## 2019-07-28 DIAGNOSIS — T8789 Other complications of amputation stump: Secondary | ICD-10-CM | POA: Diagnosis not present

## 2019-07-30 DIAGNOSIS — M4003 Postural kyphosis, cervicothoracic region: Secondary | ICD-10-CM | POA: Diagnosis not present

## 2019-07-30 DIAGNOSIS — M9901 Segmental and somatic dysfunction of cervical region: Secondary | ICD-10-CM | POA: Diagnosis not present

## 2019-07-30 DIAGNOSIS — M9902 Segmental and somatic dysfunction of thoracic region: Secondary | ICD-10-CM | POA: Diagnosis not present

## 2019-07-30 DIAGNOSIS — M50122 Cervical disc disorder at C5-C6 level with radiculopathy: Secondary | ICD-10-CM | POA: Diagnosis not present

## 2019-07-31 DIAGNOSIS — Z89511 Acquired absence of right leg below knee: Secondary | ICD-10-CM | POA: Diagnosis not present

## 2019-07-31 DIAGNOSIS — E1151 Type 2 diabetes mellitus with diabetic peripheral angiopathy without gangrene: Secondary | ICD-10-CM | POA: Diagnosis not present

## 2019-07-31 DIAGNOSIS — E1142 Type 2 diabetes mellitus with diabetic polyneuropathy: Secondary | ICD-10-CM | POA: Diagnosis not present

## 2019-07-31 DIAGNOSIS — I70209 Unspecified atherosclerosis of native arteries of extremities, unspecified extremity: Secondary | ICD-10-CM | POA: Diagnosis not present

## 2019-07-31 DIAGNOSIS — I2510 Atherosclerotic heart disease of native coronary artery without angina pectoris: Secondary | ICD-10-CM | POA: Diagnosis not present

## 2019-07-31 DIAGNOSIS — I10 Essential (primary) hypertension: Secondary | ICD-10-CM | POA: Diagnosis not present

## 2019-07-31 DIAGNOSIS — F17210 Nicotine dependence, cigarettes, uncomplicated: Secondary | ICD-10-CM | POA: Diagnosis not present

## 2019-07-31 DIAGNOSIS — Z4781 Encounter for orthopedic aftercare following surgical amputation: Secondary | ICD-10-CM | POA: Diagnosis not present

## 2019-07-31 DIAGNOSIS — Z89612 Acquired absence of left leg above knee: Secondary | ICD-10-CM | POA: Diagnosis not present

## 2019-08-07 DIAGNOSIS — E1142 Type 2 diabetes mellitus with diabetic polyneuropathy: Secondary | ICD-10-CM | POA: Diagnosis not present

## 2019-08-07 DIAGNOSIS — Z89511 Acquired absence of right leg below knee: Secondary | ICD-10-CM | POA: Diagnosis not present

## 2019-08-07 DIAGNOSIS — Z89612 Acquired absence of left leg above knee: Secondary | ICD-10-CM | POA: Diagnosis not present

## 2019-08-07 DIAGNOSIS — I2510 Atherosclerotic heart disease of native coronary artery without angina pectoris: Secondary | ICD-10-CM | POA: Diagnosis not present

## 2019-08-07 DIAGNOSIS — I70209 Unspecified atherosclerosis of native arteries of extremities, unspecified extremity: Secondary | ICD-10-CM | POA: Diagnosis not present

## 2019-08-07 DIAGNOSIS — E1151 Type 2 diabetes mellitus with diabetic peripheral angiopathy without gangrene: Secondary | ICD-10-CM | POA: Diagnosis not present

## 2019-08-07 DIAGNOSIS — Z4781 Encounter for orthopedic aftercare following surgical amputation: Secondary | ICD-10-CM | POA: Diagnosis not present

## 2019-08-07 DIAGNOSIS — F17210 Nicotine dependence, cigarettes, uncomplicated: Secondary | ICD-10-CM | POA: Diagnosis not present

## 2019-08-07 DIAGNOSIS — I10 Essential (primary) hypertension: Secondary | ICD-10-CM | POA: Diagnosis not present

## 2019-08-11 ENCOUNTER — Other Ambulatory Visit: Payer: Self-pay

## 2019-08-11 ENCOUNTER — Encounter (HOSPITAL_BASED_OUTPATIENT_CLINIC_OR_DEPARTMENT_OTHER): Payer: Medicare HMO | Attending: Internal Medicine | Admitting: Internal Medicine

## 2019-08-11 DIAGNOSIS — L97818 Non-pressure chronic ulcer of other part of right lower leg with other specified severity: Secondary | ICD-10-CM | POA: Insufficient documentation

## 2019-08-11 DIAGNOSIS — E1151 Type 2 diabetes mellitus with diabetic peripheral angiopathy without gangrene: Secondary | ICD-10-CM | POA: Insufficient documentation

## 2019-08-11 DIAGNOSIS — Z89511 Acquired absence of right leg below knee: Secondary | ICD-10-CM | POA: Diagnosis not present

## 2019-08-11 DIAGNOSIS — E11622 Type 2 diabetes mellitus with other skin ulcer: Secondary | ICD-10-CM | POA: Insufficient documentation

## 2019-08-11 DIAGNOSIS — T8131XA Disruption of external operation (surgical) wound, not elsewhere classified, initial encounter: Secondary | ICD-10-CM | POA: Diagnosis not present

## 2019-08-11 DIAGNOSIS — Z89612 Acquired absence of left leg above knee: Secondary | ICD-10-CM | POA: Diagnosis not present

## 2019-08-12 DIAGNOSIS — Z89511 Acquired absence of right leg below knee: Secondary | ICD-10-CM | POA: Diagnosis not present

## 2019-08-12 DIAGNOSIS — I70209 Unspecified atherosclerosis of native arteries of extremities, unspecified extremity: Secondary | ICD-10-CM | POA: Diagnosis not present

## 2019-08-12 DIAGNOSIS — F17210 Nicotine dependence, cigarettes, uncomplicated: Secondary | ICD-10-CM | POA: Diagnosis not present

## 2019-08-12 DIAGNOSIS — E1142 Type 2 diabetes mellitus with diabetic polyneuropathy: Secondary | ICD-10-CM | POA: Diagnosis not present

## 2019-08-12 DIAGNOSIS — Z4781 Encounter for orthopedic aftercare following surgical amputation: Secondary | ICD-10-CM | POA: Diagnosis not present

## 2019-08-12 DIAGNOSIS — I10 Essential (primary) hypertension: Secondary | ICD-10-CM | POA: Diagnosis not present

## 2019-08-12 DIAGNOSIS — Z89612 Acquired absence of left leg above knee: Secondary | ICD-10-CM | POA: Diagnosis not present

## 2019-08-12 DIAGNOSIS — E1151 Type 2 diabetes mellitus with diabetic peripheral angiopathy without gangrene: Secondary | ICD-10-CM | POA: Diagnosis not present

## 2019-08-12 DIAGNOSIS — I2510 Atherosclerotic heart disease of native coronary artery without angina pectoris: Secondary | ICD-10-CM | POA: Diagnosis not present

## 2019-08-20 DIAGNOSIS — E1142 Type 2 diabetes mellitus with diabetic polyneuropathy: Secondary | ICD-10-CM | POA: Diagnosis not present

## 2019-08-20 DIAGNOSIS — I2510 Atherosclerotic heart disease of native coronary artery without angina pectoris: Secondary | ICD-10-CM | POA: Diagnosis not present

## 2019-08-20 DIAGNOSIS — Z4801 Encounter for change or removal of surgical wound dressing: Secondary | ICD-10-CM | POA: Diagnosis not present

## 2019-08-20 DIAGNOSIS — I10 Essential (primary) hypertension: Secondary | ICD-10-CM | POA: Diagnosis not present

## 2019-08-20 DIAGNOSIS — E1151 Type 2 diabetes mellitus with diabetic peripheral angiopathy without gangrene: Secondary | ICD-10-CM | POA: Diagnosis not present

## 2019-08-20 DIAGNOSIS — Z89511 Acquired absence of right leg below knee: Secondary | ICD-10-CM | POA: Diagnosis not present

## 2019-08-20 DIAGNOSIS — F17210 Nicotine dependence, cigarettes, uncomplicated: Secondary | ICD-10-CM | POA: Diagnosis not present

## 2019-08-20 DIAGNOSIS — I70209 Unspecified atherosclerosis of native arteries of extremities, unspecified extremity: Secondary | ICD-10-CM | POA: Diagnosis not present

## 2019-08-20 DIAGNOSIS — Z4781 Encounter for orthopedic aftercare following surgical amputation: Secondary | ICD-10-CM | POA: Diagnosis not present

## 2019-08-25 ENCOUNTER — Encounter (HOSPITAL_BASED_OUTPATIENT_CLINIC_OR_DEPARTMENT_OTHER): Payer: Medicare HMO | Admitting: Internal Medicine

## 2019-08-25 ENCOUNTER — Other Ambulatory Visit: Payer: Self-pay

## 2019-08-25 DIAGNOSIS — Z89511 Acquired absence of right leg below knee: Secondary | ICD-10-CM | POA: Diagnosis not present

## 2019-08-25 DIAGNOSIS — E11622 Type 2 diabetes mellitus with other skin ulcer: Secondary | ICD-10-CM | POA: Diagnosis not present

## 2019-08-25 DIAGNOSIS — L97818 Non-pressure chronic ulcer of other part of right lower leg with other specified severity: Secondary | ICD-10-CM | POA: Diagnosis not present

## 2019-08-25 DIAGNOSIS — E1151 Type 2 diabetes mellitus with diabetic peripheral angiopathy without gangrene: Secondary | ICD-10-CM | POA: Diagnosis not present

## 2019-08-25 DIAGNOSIS — Z89612 Acquired absence of left leg above knee: Secondary | ICD-10-CM | POA: Diagnosis not present

## 2019-08-25 DIAGNOSIS — T8131XA Disruption of external operation (surgical) wound, not elsewhere classified, initial encounter: Secondary | ICD-10-CM | POA: Diagnosis not present

## 2019-08-28 DIAGNOSIS — I2510 Atherosclerotic heart disease of native coronary artery without angina pectoris: Secondary | ICD-10-CM | POA: Diagnosis not present

## 2019-08-28 DIAGNOSIS — I10 Essential (primary) hypertension: Secondary | ICD-10-CM | POA: Diagnosis not present

## 2019-08-28 DIAGNOSIS — E1151 Type 2 diabetes mellitus with diabetic peripheral angiopathy without gangrene: Secondary | ICD-10-CM | POA: Diagnosis not present

## 2019-08-28 DIAGNOSIS — Z4801 Encounter for change or removal of surgical wound dressing: Secondary | ICD-10-CM | POA: Diagnosis not present

## 2019-08-28 DIAGNOSIS — E1142 Type 2 diabetes mellitus with diabetic polyneuropathy: Secondary | ICD-10-CM | POA: Diagnosis not present

## 2019-08-28 DIAGNOSIS — F17210 Nicotine dependence, cigarettes, uncomplicated: Secondary | ICD-10-CM | POA: Diagnosis not present

## 2019-08-28 DIAGNOSIS — Z89511 Acquired absence of right leg below knee: Secondary | ICD-10-CM | POA: Diagnosis not present

## 2019-08-28 DIAGNOSIS — I70209 Unspecified atherosclerosis of native arteries of extremities, unspecified extremity: Secondary | ICD-10-CM | POA: Diagnosis not present

## 2019-08-28 DIAGNOSIS — Z4781 Encounter for orthopedic aftercare following surgical amputation: Secondary | ICD-10-CM | POA: Diagnosis not present

## 2019-09-01 DIAGNOSIS — E1151 Type 2 diabetes mellitus with diabetic peripheral angiopathy without gangrene: Secondary | ICD-10-CM | POA: Diagnosis not present

## 2019-09-01 DIAGNOSIS — I10 Essential (primary) hypertension: Secondary | ICD-10-CM | POA: Diagnosis not present

## 2019-09-01 DIAGNOSIS — F17210 Nicotine dependence, cigarettes, uncomplicated: Secondary | ICD-10-CM | POA: Diagnosis not present

## 2019-09-01 DIAGNOSIS — E1142 Type 2 diabetes mellitus with diabetic polyneuropathy: Secondary | ICD-10-CM | POA: Diagnosis not present

## 2019-09-01 DIAGNOSIS — Z4801 Encounter for change or removal of surgical wound dressing: Secondary | ICD-10-CM | POA: Diagnosis not present

## 2019-09-01 DIAGNOSIS — Z89511 Acquired absence of right leg below knee: Secondary | ICD-10-CM | POA: Diagnosis not present

## 2019-09-01 DIAGNOSIS — I2510 Atherosclerotic heart disease of native coronary artery without angina pectoris: Secondary | ICD-10-CM | POA: Diagnosis not present

## 2019-09-01 DIAGNOSIS — Z4781 Encounter for orthopedic aftercare following surgical amputation: Secondary | ICD-10-CM | POA: Diagnosis not present

## 2019-09-01 DIAGNOSIS — I70209 Unspecified atherosclerosis of native arteries of extremities, unspecified extremity: Secondary | ICD-10-CM | POA: Diagnosis not present

## 2019-09-08 ENCOUNTER — Encounter (HOSPITAL_BASED_OUTPATIENT_CLINIC_OR_DEPARTMENT_OTHER): Payer: Medicare HMO | Attending: Internal Medicine | Admitting: Internal Medicine

## 2019-09-08 ENCOUNTER — Other Ambulatory Visit: Payer: Self-pay

## 2019-09-08 DIAGNOSIS — T8781 Dehiscence of amputation stump: Secondary | ICD-10-CM | POA: Diagnosis not present

## 2019-09-11 DIAGNOSIS — Z4781 Encounter for orthopedic aftercare following surgical amputation: Secondary | ICD-10-CM | POA: Diagnosis not present

## 2019-09-11 DIAGNOSIS — I10 Essential (primary) hypertension: Secondary | ICD-10-CM | POA: Diagnosis not present

## 2019-09-11 DIAGNOSIS — F17210 Nicotine dependence, cigarettes, uncomplicated: Secondary | ICD-10-CM | POA: Diagnosis not present

## 2019-09-11 DIAGNOSIS — I70209 Unspecified atherosclerosis of native arteries of extremities, unspecified extremity: Secondary | ICD-10-CM | POA: Diagnosis not present

## 2019-09-11 DIAGNOSIS — E1142 Type 2 diabetes mellitus with diabetic polyneuropathy: Secondary | ICD-10-CM | POA: Diagnosis not present

## 2019-09-11 DIAGNOSIS — Z4801 Encounter for change or removal of surgical wound dressing: Secondary | ICD-10-CM | POA: Diagnosis not present

## 2019-09-11 DIAGNOSIS — Z89511 Acquired absence of right leg below knee: Secondary | ICD-10-CM | POA: Diagnosis not present

## 2019-09-11 DIAGNOSIS — I2510 Atherosclerotic heart disease of native coronary artery without angina pectoris: Secondary | ICD-10-CM | POA: Diagnosis not present

## 2019-09-11 DIAGNOSIS — E1151 Type 2 diabetes mellitus with diabetic peripheral angiopathy without gangrene: Secondary | ICD-10-CM | POA: Diagnosis not present

## 2019-09-14 ENCOUNTER — Other Ambulatory Visit: Payer: Self-pay | Admitting: Surgery

## 2019-09-15 NOTE — Progress Notes (Signed)
Danielle Buchanan, Danielle Buchanan ([id number]) Visit Report for 09/08/2019 HPI Details Patient Name: Date of Service: Danielle Buchanan, Danielle Buchanan 09/08/2019 12:30 PM Medical Record [id number] Patient Account Number: [id number] Date of Birth/Sex: Treating RN: 1956/01/28 (63 y.o. F) Primary Care Provider: [name] Other Clinician: Referring Provider: Treating Provider/Extender:, Ivin Booty, Dellia Nims in Treatment: 21 History of Present Illness Location: right foot Quality: Patient reports experiencing a dull pain to affected area(s). Severity: Patient states wound(s) are getting worse. Duration: Patient has had the wound for > 3 months prior to seeking treatment at the wound center Timing: Pain in wound is constant (hurts all the time) Context: the original wound on the right foot is the same and she's recently had a left below-knee amputation Modifying Factors: Patient wound(s)/ulcer(s) are worsening due to :significant pain at rest HPI Description: 63 year old diabetic patient with a past medical history of essential hypertension, hyper lipidemia, PTSD, was seen recently and a walk-in clinic a week ago for pain and ulcers on the feet which she's had for 2 months. The diagnosis of foot cellulitis was made and the patient was put on Bactrim DS for 14 days along with Neurontin and pain medication and asked to see the wound center. The patient has been seen by the endocrinologist [name] and noted that she has had diabetes since 1992 and has polyneuropathy and peripheral arterial disease.She is a smoker and smokes about a pack of cigarettes a day. She was recently referred to a dermatologist. Most recently she had a lower arterial duplex study done which showed the patient had no evidence of significant right or left lower extremity arterial disease based on the ABI which was 1.09 on the right and 1.01 on the left with triphasic flow except the left anterior tibial which suggest arterial  occlusive disease. The bilateral toe brachial indices were abnormal with the right being 0.23 and the left being 0.24. the patient also had an arterial duplex examination which showed plaque bilaterally in the common femoral artery and the proximal superficial femoral artery and also in the popliteal artery and the right anterior tibial artery and the left posterior tibial artery. These tests were requested by her primary care physician Dr. [name], but the patient and her caregiver who was her nephew say they did not receive any call back regarding the test nor have they been told to go to a vascular surgeon. 06/19/2017 -- was seen by [name] who has scheduled her for a aortogram and runoff on 06/24/2017-- he suspects this is going to be microvascular disease in addition to her diabetic ulcers. The benefit from an angiogram may be just as a baseline study to see if there is any pedal work that can be done to help improve the blood flow. He also discussed that she may lose her toes or have more proximal amputation of her limbs. She was recently admitted to the hospital between 06/09/2017 and 06/11/2017 with uncontrolled diabetes mellitus type 2, hypertension, nicotine dependence, worsening leg leg pain. She was treated appropriately for cellulitis she was sent home on Clindamycin 300 mg 3 times a day. Left foot x-ray done during this admission showed no radiographic evidence of osteomyelitis. Last hemoglobin A1c was 11.6% She has given up smoking for the last week. 06/26/2017 -- she was seen by [name] recently and an angiogram was done and found that she does not have any options for reconstruction but does have flow bilaterally to the ankles. He was hopeful that with wound  care and smoking sensation she could heal her wounds. Procedure performed on 06/24/2017 was a aortogram with bilateral lower extremity runoff. The findings were that of a patent aortobiiliac segments with a 30%  stenosis of the left SFA and otherwise patent runoff to the popliteal arteries bilaterally. Predominant runoff on the right is via the peroneal artery with not much flow to the right foot. On the left side dominant runoff is via the posterior tibial artery and there is also peroneal artery. There is no appreciable plantar arch on the left either. No intervention was undertaken. 07/17/2017 -- the initial paperwork to [facility] has denied her treatment for hyperbaric oxygen therapy. We will re- file the request. In the meanwhile she is scheduled to see [name] on Friday for further discussions regarding amputation of her left forefoot. she was in the ER on 07/12/2017 and I have reviewed these notes including the workup and the treatment plan and I agree with this. 08/21/2017 -- the patient was in and out of the hospital after a revision surgery on 08/12/2017 where she underwent a revision left below-knee amputation after she had a fall and distracted the previous amputation site. Her first BKA on that side was done on 07/23/2017. Further amount of necrotic tissue and bone was resected and primary closure was performed. she was discharged home on 08/15/2017 the patient is been using Santyl ointment on the right foot and the left below-knee amputation site is under the care of her surgeon [name]. She says her blood glucose control has been good and she still not smoking READMISSION 04/09/2019 This is a now 63 year old woman who we cared for in this clinic in 2018 predominantly with a left foot wound. She ultimately had a left BKA and a left U KA. More recently she developed wounds on her feet on the right. She underwent a right BKA on 01/19/2019 for gangrene of the right foot. Her nephew is present tells me that the staples were removed about 2 weeks after that and she had Steri-Strips placed. They noted some drainage under the Steri- Strips and some black discoloration. This reopened with a  fairly large wound on the medial aspect of the stump incision and a smaller eschar laterally. Looking through the notes there may have been a stump infection in April as well. They have been using wet-to-dry dressings to this area twice daily. They are referred here by vein and vascular Dr. Darrick Penna for our review. Past medical history includes type 2 diabetes with a history of polyneuropathy and PAD. Last hemoglobin A1c at 8.5. Hypertension hyperlipidemia, coronary artery disease, continued tobacco abuse at 1/2 pack/day and some degree of memory decline is listed on her problem list Not possible to do ABIs of course however it is listed that it 1.2 years ago the angiogram suggest that there was enough blood flow down to the level of the ankle. She has had a angioplasty of her right posterior tibial artery and the right tibioperoneal trunk in February 2020 and then a right transmetatarsal amputation in February 2020 before the below-knee amputation. I do not see specifically the angiogram which I will need to look up at a later time. Social history; patient lives alone although she has help from family for her dressings. She has a continued half a pack per day smoker 7/16; difficult wound on the right BKA stump. There is a large wound anteriorly and laterally a small satellite area. We have been using Santyl. She tells me she  is got her cigarette smoking down to 4 cigarettes a day 7/23; difficult wounds on the right BKA stump. There is a large wound anteriorly and medially and a smaller area laterally. We have been using Santyl. Family is changing this daily. Still smoking 4 cigarettes a day. 7/30; no real improvement in the area on the right BKA stump. There is a large wound anterior and medially and a much smaller wound laterally. Both of these have necrotic surface is certainly not viable over the large wound. Been using Santyl for several weeks without real improvement. Changed to Iodoflex  today 8/13-Is back at 2 weeks, the wound of the right BKA stump looks about the same perhaps marginally better, with some granulation tissue formation, we are using Iodoflex. The much smaller lateral wound is healed 9/3; the right BKA stump wound is slightly smaller. Surface quite a bit better. She has palpable popliteal pulses. We have been using Iodoflex. Her nephew to helps her with the dressings and washing of the wound I think they have made some progress 9/22; the right BKA stump is slightly smaller. Probably 80% of this as healthy granulation now. Debrided with Anasept and gauze no mechanical debridement. We have been ordering Iodoflex but apparently getting [facility] who apparently do not have the [place] 10/6; right BKA stump 2-week follow-up. We have been using Iodoflex but [facility] is providing an iodine-based polymer foam dressing called Iodoplex. These may not be comparable. They got a very large supply of this at home. 10/20; right BKA stump 2-week follow-up. Using Iodoplex at home. Changing this every second day. The wound is making nice progress measures smaller today 11/3; right BKA stump. 2-week follow-up. Using Iodoflex at home. For the first time recently a completely nonviable surface which was difficult to explain. No evidence of infection. Required debridement in spite of this reasonably bad news the surface area of the wound is better 11/17; right BKA stump. Using Iodoflex. Much better appearance this week required mechanical debridement last week none done today 12/1; right BKA stump. 2-week follow-up. We have been using Iodoflex. She comes in today with a much better looking wound surface and a smaller surface area Electronic Signature(s) Signed: 09/08/2019 6:45:39 PM By: Linton Ham MD Entered By: Linton Ham on 09/08/2019  13:38:39 -------------------------------------------------------------------------------- Physical Exam Details Patient Name: Date of Service: Danielle Macho. 09/08/2019 12:30 PM Medical Record [id number] Patient Account Number: [id number] Date of Birth/Sex: Treating RN: May 05, 1956 (63 y.o. F) Primary Care Provider: Maurice Small Other Clinician: Referring Provider: Treating Provider/Extender:, Michiel Cowboy, Judson Roch in Treatment: 21 Constitutional Sitting or standing Blood Pressure is within target range for patient.. Pulse regular and within target range for patient.Marland Kitchen Respirations regular, non-labored and within target range.. Temperature is normal and within the target range for the patient.Marland Kitchen Appears in no distress. Eyes Conjunctivae clear. No discharge.no icterus. Respiratory work of breathing is normal. Cardiovascular Popliteal pulses palpable on the right. Integumentary (Hair, Skin) No erythema around the wound. Psychiatric appears at normal baseline. Notes Wound exam; right BKA stump. Really healthy looking wound and smaller. No mechanical debridement was felt to be necessary. Surface looks vibrant. No evidence of surrounding infection there was no erythema around the wound Electronic Signature(s) Signed: 09/08/2019 6:45:39 PM By: Linton Ham MD Entered By: Linton Ham on 09/08/2019 13:39:55 -------------------------------------------------------------------------------- Physician Orders Details Patient Name: Date of Service: Danielle Macho. 09/08/2019 12:30 PM Medical Record [id number] Patient Account Number: [id number] Date of Birth/Sex: Treating RN:  1956-09-10 (63 y.o. [name] Primary Care Provider: [name] Other Clinician: Referring Provider: Treating Provider/Extender:, Ivin Booty, Dellia Nims in Treatment: 21 Verbal / Phone Orders: No Diagnosis Coding ICD-10 Coding Code Description E11.51 Type 2 diabetes  mellitus with diabetic peripheral angiopathy without gangrene E11.622 Type 2 diabetes mellitus with other skin ulcer L97.818 Non-pressure chronic ulcer of other part of right lower leg with other specified severity Z89.511 Acquired absence of right leg below knee Z89.612 Acquired absence of left leg above knee Follow-up Appointments Return Appointment in 2 weeks. Dressing Change Frequency Change Dressing every other day. - home health to change twice a week. Wound Cleansing May shower and wash wound with soap and water. Primary Wound Dressing Wound #8 Right,Medial Amputation Site - Below Knee Other: - iodoflex in clinic, home health to apply Ioplex Secondary Dressing Wound #8 Right,Medial Amputation Site - Below Knee Kerlix/Rolled Gauze ABD pad Edema Control Elevate legs to the level of the heart or above for 30 minutes daily and/or when sitting, a frequency of: - 3-4 a day to aid in swelling. Additional Orders / Instructions Stop/Decrease Smoking Follow Nutritious Diet - increase protein and vegetables within diabetic diet. Home Health Continue Home Health skilled nursing for wound care. - [facility]. Electronic Signature(s) Signed: 09/08/2019 6:45:39 PM By: Baltazar Najjar MD Signed: 09/15/2019 2:56:39 PM By: Yevonne Pax RN Entered By: Yevonne Pax on 09/08/2019 13:13:50 -------------------------------------------------------------------------------- Problem List Details Patient Name: Date of Service: Danielle Contes. 09/08/2019 12:30 PM Medical Record [id number] Patient Account Number: [id number] Date of Birth/Sex: Treating RN: [birth year] (63 y.o. [name] Primary Care Provider: [name] Other Clinician: Referring Provider: Treating Provider/Extender:, Ivin Booty, Dellia Nims in Treatment: 21 Active Problems ICD-10 Evaluated Encounter Code Description Active Date Today Diagnosis E11.51 Type 2 diabetes mellitus with diabetic peripheral  04/09/2019 No Yes angiopathy without gangrene E11.622 Type 2 diabetes mellitus with other skin ulcer 04/09/2019 No Yes L97.818 Non-pressure chronic ulcer of other part of right lower 04/09/2019 No Yes leg with other specified severity Z89.511 Acquired absence of right leg below knee 04/09/2019 No Yes Z89.612 Acquired absence of left leg above knee 04/09/2019 No Yes Inactive Problems Resolved Problems Electronic Signature(s) Signed: 09/08/2019 6:45:39 PM By: Baltazar Najjar MD Entered By: Baltazar Najjar on 09/08/2019 13:37:53 -------------------------------------------------------------------------------- Progress Note Details Patient Name: Date of Service: Danielle Contes. 09/08/2019 12:30 PM Medical Record [id number] Patient Account Number: [id number] Date of Birth/Sex: Treating RN: 03/24/1956 (63 y.o. F) Primary Care Provider: [name] Other Clinician: Referring Provider: Treating Provider/Extender:, Ivin Booty, Dellia Nims in Treatment: 21 Subjective History of Present Illness (HPI) The following HPI elements were documented for the patient's wound: Location: right foot Quality: Patient reports experiencing a dull pain to affected area(s). Severity: Patient states wound(s) are getting worse. Duration: Patient has had the wound for > 3 months prior to seeking treatment at the wound center Timing: Pain in wound is constant (hurts all the time) Context: the original wound on the right foot is the same and she's recently had a left below-knee amputation Modifying Factors: Patient wound(s)/ulcer(s) are worsening due to :significant pain at rest 63 year old diabetic patient with a past medical history of essential hypertension, hyper lipidemia, PTSD, was seen recently and a walk-in clinic a week ago for pain and ulcers on the feet which she's had for 2 months. The diagnosis of foot cellulitis was made and the patient was put on Bactrim DS for 14 days along with Neurontin and  pain medication and asked  to see the wound center. The patient has been seen by the endocrinologist [name] and noted that she has had diabetes since 1992 and has polyneuropathy and peripheral arterial disease.She is a smoker and smokes about a pack of cigarettes a day. She was recently referred to a dermatologist. Most recently she had a lower arterial duplex study done which showed the patient had no evidence of significant right or left lower extremity arterial disease based on the ABI which was 1.09 on the right and 1.01 on the left with triphasic flow except the left anterior tibial which suggest arterial occlusive disease. The bilateral toe brachial indices were abnormal with the right being 0.23 and the left being 0.24. the patient also had an arterial duplex examination which showed plaque bilaterally in the common femoral artery and the proximal superficial femoral artery and also in the popliteal artery and the right anterior tibial artery and the left posterior tibial artery. These tests were requested by her primary care physician Dr. [name], but the patient and her caregiver who was her nephew say they did not receive any call back regarding the test nor have they been told to go to a vascular surgeon. 06/19/2017 -- was seen by [name] who has scheduled her for a aortogram and runoff on 06/24/2017-- he suspects this is going to be microvascular disease in addition to her diabetic ulcers. The benefit from an angiogram may be just as a baseline study to see if there is any pedal work that can be done to help improve the blood flow. He also discussed that she may lose her toes or have more proximal amputation of her limbs. She was recently admitted to the hospital between 06/09/2017 and 06/11/2017 with uncontrolled diabetes mellitus type 2, hypertension, nicotine dependence, worsening leg leg pain. She was treated appropriately for cellulitis she was sent home on  Clindamycin 300 mg 3 times a day. Left foot x-ray done during this admission showed no radiographic evidence of osteomyelitis. Last hemoglobin A1c was 11.6% She has given up smoking for the last week. 06/26/2017 -- she was seen by [name] recently and an angiogram was done and found that she does not have any options for reconstruction but does have flow bilaterally to the ankles. He was hopeful that with wound care and smoking sensation she could heal her wounds. Procedure performed on 06/24/2017 was a aortogram with bilateral lower extremity runoff. The findings were that of a patent aortobiiliac segments with a 30% stenosis of the left SFA and otherwise patent runoff to the popliteal arteries bilaterally. Predominant runoff on the right is via the peroneal artery with not much flow to the right foot. On the left side dominant runoff is via the posterior tibial artery and there is also peroneal artery. There is no appreciable plantar arch on the left either. No intervention was undertaken. 07/17/2017 -- the initial paperwork to [facility] has denied her treatment for hyperbaric oxygen therapy. We will re- file the request. In the meanwhile she is scheduled to see [name] on Friday for further discussions regarding amputation of her left forefoot. she was in the ER on 07/12/2017 and I have reviewed these notes including the workup and the treatment plan and I agree with this. 08/21/2017 -- the patient was in and out of the hospital after a revision surgery on 08/12/2017 where she underwent a revision left below-knee amputation after she had a fall and distracted the previous amputation site. Her first BKA  on that side was done on 07/23/2017. Further amount of necrotic tissue and bone was resected and primary closure was performed. she was discharged home on 08/15/2017 the patient is been using Santyl ointment on the right foot and the left below-knee amputation site is under the  care of her surgeon [name]. She says her blood glucose control has been good and she still not smoking READMISSION 04/09/2019 This is a now 63 year old woman who we cared for in this clinic in 2018 predominantly with a left foot wound. She ultimately had a left BKA and a left U KA. More recently she developed wounds on her feet on the right. She underwent a right BKA on 01/19/2019 for gangrene of the right foot. Her nephew is present tells me that the staples were removed about 2 weeks after that and she had Steri-Strips placed. They noted some drainage under the Steri- Strips and some black discoloration. This reopened with a fairly large wound on the medial aspect of the stump incision and a smaller eschar laterally. Looking through the notes there may have been a stump infection in April as well. They have been using wet-to-dry dressings to this area twice daily. They are referred here by vein and vascular Dr. Darrick Penna for our review. Past medical history includes type 2 diabetes with a history of polyneuropathy and PAD. Last hemoglobin A1c at 8.5. Hypertension hyperlipidemia, coronary artery disease, continued tobacco abuse at 1/2 pack/day and some degree of memory decline is listed on her problem list Not possible to do ABIs of course however it is listed that it 1.2 years ago the angiogram suggest that there was enough blood flow down to the level of the ankle. She has had a angioplasty of her right posterior tibial artery and the right tibioperoneal trunk in February 2020 and then a right transmetatarsal amputation in February 2020 before the below-knee amputation. I do not see specifically the angiogram which I will need to look up at a later time. Social history; patient lives alone although she has help from family for her dressings. She has a continued half a pack per day smoker 7/16; difficult wound on the right BKA stump. There is a large wound anteriorly and laterally a small  satellite area. We have been using Santyl. She tells me she is got her cigarette smoking down to 4 cigarettes a day 7/23; difficult wounds on the right BKA stump. There is a large wound anteriorly and medially and a smaller area laterally. We have been using Santyl. Family is changing this daily. Still smoking 4 cigarettes a day. 7/30; no real improvement in the area on the right BKA stump. There is a large wound anterior and medially and a much smaller wound laterally. Both of these have necrotic surface is certainly not viable over the large wound. Been using Santyl for several weeks without real improvement. Changed to Iodoflex today 8/13-Is back at 2 weeks, the wound of the right BKA stump looks about the same perhaps marginally better, with some granulation tissue formation, we are using Iodoflex. The much smaller lateral wound is healed 9/3; the right BKA stump wound is slightly smaller. Surface quite a bit better. She has palpable popliteal pulses. We have been using Iodoflex. Her nephew to helps her with the dressings and washing of the wound I think they have made some progress 9/22; the right BKA stump is slightly smaller. Probably 80% of this as healthy granulation now. Debrided with Anasept and gauze no mechanical  debridement. We have been ordering Iodoflex but apparently [facility] who apparently do not have the Iodoflex 10/6; right BKA stump 2-week follow-up. We have been using Iodoflex but [facility] is providing an iodine-based polymer foam dressing called Iodoplex. These may not be comparable. They got a very large supply of this at home. 10/20; right BKA stump 2-week follow-up. Using Iodoplex at home. Changing this every second day. The wound is making nice progress measures smaller today 11/3; right BKA stump. 2-week follow-up. Using Iodoflex at home. For the first time recently a completely nonviable surface which was difficult to  explain. No evidence of infection. Required debridement in spite of this reasonably bad news the surface area of the wound is better 11/17; right BKA stump. Using Iodoflex. Much better appearance this week required mechanical debridement last week none done today 12/1; right BKA stump. 2-week follow-up. We have been using Iodoflex. She comes in today with a much better looking wound surface and a smaller surface area Objective Constitutional Sitting or standing Blood Pressure is within target range for patient.. Pulse regular and within target range for patient.Marland Kitchen Respirations regular, non-labored and within target range.. Temperature is normal and within the target range for the patient.Marland Kitchen Appears in no distress. Vitals Time Taken: 12:57 PM, Height: 62 in, Source: Stated, Weight: 190 lbs, Source: Stated, BMI: 34.7, Temperature: 98.1 F, Pulse: 64 bpm, Respiratory Rate: 18 breaths/min, Blood Pressure: 107/51 mmHg, Capillary Blood Glucose: 150 mg/dl. General Notes: glucose per pt report Eyes Conjunctivae clear. No discharge.no icterus. Respiratory work of breathing is normal. Cardiovascular Popliteal pulses palpable on the right. Psychiatric appears at normal baseline. General Notes: Wound exam; right BKA stump. Really healthy looking wound and smaller. No mechanical debridement was felt to be necessary. Surface looks vibrant. No evidence of surrounding infection there was no erythema around the wound Integumentary (Hair, Skin) No erythema around the wound. Wound #8 status is Open. Original cause of wound was Surgical Injury. The wound is located on the Right,Medial Amputation Site - Below Knee. The wound measures 0.7cm length x 1.5cm width x 0.1cm depth; 0.825cm^2 area and 0.082cm^3 volume. There is Fat Layer (Subcutaneous Tissue) Exposed exposed. There is no tunneling or undermining noted. There is a small amount of serosanguineous drainage noted. The wound margin is flat and intact.  There is large (67-100%) red granulation within the wound bed. There is no necrotic tissue within the wound bed. 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 Dressing Change Frequency: Change Dressing every other day. - home health to change twice a week. Wound Cleansing: May shower and wash wound with soap and water. Primary Wound Dressing: Wound #8 Right,Medial Amputation Site - Below Knee: Other: - iodoflex in clinic, home health to apply Ioplex Secondary Dressing: Wound #8 Right,Medial Amputation Site - Below Knee: Kerlix/Rolled Gauze ABD pad Edema Control: Elevate legs to the level of the heart or above for 30 minutes daily and/or when sitting, a frequency of: - 3-4 a day to aid in swelling. Additional Orders / Instructions: Stop/Decrease Smoking Follow Nutritious Diet - increase protein and vegetables within diabetic diet. Home Health: Continue Home Health skilled nursing for wound care. - [facility]. 1. As the patient's wound is doing so well I am continuing the Iodoflex. 2. No evidence of surrounding infection 3. Popliteal pulses palpable, no ischemic concerns Electronic Signature(s) Signed: 09/08/2019 6:45:39 PM By: Baltazar Najjarobson,  MD Entered By: Baltazar Najjarobson,  on 09/08/2019 13:40:33 -------------------------------------------------------------------------------- SuperBill Details Patient Name: Date of Service: Danielle Buchanan, Danielle M. 09/08/2019 Medical Record [id number] Patient Account Number: [id number] Date of Birth/Sex: Treating RN: 12-23-1955 (63 y.o. F) Primary Care Provider: Shirlean MylarWebb, Carol Other  Clinician: Referring Provider: Treating Provider/Extender:, Ivin Booty Webb, Dellia Nimsarol Weeks in Treatment: 21 Diagnosis Coding ICD-10 Codes Code Description E11.51 Type 2 diabetes mellitus with diabetic peripheral angiopathy without gangrene E11.622 Type 2 diabetes mellitus with other skin ulcer L97.818 Non-pressure chronic ulcer of other part of right lower leg with other specified severity Z89.511 Acquired absence of right leg below knee Z89.612 Acquired absence of left leg above knee Physician Procedures CPT4 Code Description: [id number] - WC PHYS LEVEL 3 - EST PT ICD-10 Diagnosis Description L97.818 Non-pressure chronic ulcer of other part of right lower leg wit severity E11.51 Type 2 diabetes mellitus with diabetic peripheral angiopathy wi Z89.511  Acquired absence of right leg below knee Modifier: h other specified thout gangrene Quantity: 1 Electronic Signature(s) Signed: 09/08/2019 6:45:39 PM By: Baltazar Najjarobson,  MD Entered By: Baltazar Najjarobson,  on 09/08/2019 13:41:01

## 2019-09-15 NOTE — Progress Notes (Signed)
[name], [name] ([id number]) Visit Report for 08/25/2019 HPI Details Patient Name: Date of Service: Danielle Buchanan, Danielle Buchanan 08/25/2019 12:30 PM Medical Record [id number] Patient Account Number: [id number] Date of Birth/Sex: Treating RN: [birth year] (63 y.o. [name] Primary Care Provider: [name] Other Clinician: Referring Provider: Treating Provider/Extender:Robson, Ivin Booty, Dellia Nims in Treatment: 19 History of Present Illness Location: right foot Quality: Patient reports experiencing a dull pain to affected area(s). Severity: Patient states wound(s) are getting worse. Duration: Patient has had the wound for > 3 months prior to seeking treatment at the wound center Timing: Pain in wound is constant (hurts all the time) Context: the original wound on the right foot is the same and she's recently had a left below-knee amputation Modifying Factors: Patient wound(s)/ulcer(s) are worsening due to :significant pain at rest HPI Description: 63 year old diabetic patient with a past medical history of essential hypertension, hyper lipidemia, PTSD, was seen recently and a walk-in clinic a week ago for pain and ulcers on the feet which she's had for 2 months. The diagnosis of foot cellulitis was made and the patient was put on Bactrim DS for 14 days along with Neurontin and pain medication and asked to see the wound center. The patient has been seen by the endocrinologist [name] and noted that she has had diabetes since 1992 and has polyneuropathy and peripheral arterial disease.She is a smoker and smokes about a pack of cigarettes a day. She was recently referred to a dermatologist. Most recently she had a lower arterial duplex study done which showed the patient had no evidence of significant right or left lower extremity arterial disease based on the ABI which was 1.09 on the right and 1.01 on the left with triphasic flow except the left anterior tibial which  suggest arterial occlusive disease. The bilateral toe brachial indices were abnormal with the right being 0.23 and the left being 0.24. the patient also had an arterial duplex examination which showed plaque bilaterally in the common femoral artery and the proximal superficial femoral artery and also in the popliteal artery and the right anterior tibial artery and the left posterior tibial artery. These tests were requested by her primary care physician Dr. [name], but the patient and her caregiver who was her nephew say they did not receive any call back regarding the test nor have they been told to go to a vascular surgeon. 06/19/2017 -- was seen by [name] who has scheduled her for a aortogram and runoff on 06/24/2017-- he suspects this is going to be microvascular disease in addition to her diabetic ulcers. The benefit from an angiogram may be just as a baseline study to see if there is any pedal work that can be done to help improve the blood flow. He also discussed that she may lose her toes or have more proximal amputation of her limbs. She was recently admitted to the hospital between 06/09/2017 and 06/11/2017 with uncontrolled diabetes mellitus type 2, hypertension, nicotine dependence, worsening leg leg pain. She was treated appropriately for cellulitis she was sent home on Clindamycin 300 mg 3 times a day. Left foot x-ray done during this admission showed no radiographic evidence of osteomyelitis. Last hemoglobin A1c was 11.6% She has given up smoking for the last week. 06/26/2017 -- she was seen by [name] recently and an angiogram was done and found that she does not have any options for reconstruction but does have flow bilaterally to the ankles. He was hopeful that  with wound care and smoking sensation she could heal her wounds. Procedure performed on 06/24/2017 was a aortogram with bilateral lower extremity runoff. The findings were that of a patent aortobiiliac  segments with a 30% stenosis of the left SFA and otherwise patent runoff to the popliteal arteries bilaterally. Predominant runoff on the right is via the peroneal artery with not much flow to the right foot. On the left side dominant runoff is via the posterior tibial artery and there is also peroneal artery. There is no appreciable plantar arch on the left either. No intervention was undertaken. 07/17/2017 -- the initial paperwork to [facility] has denied her treatment for hyperbaric oxygen therapy. We will re- file the request. In the meanwhile she is scheduled to see Dr. Randie Heinzain on Friday for further discussions regarding amputation of her left forefoot. she was in the ER on 07/12/2017 and I have reviewed these notes including the workup and the treatment plan and I agree with this. 08/21/2017 -- the patient was in and out of the hospital after a revision surgery on 08/12/2017 where she underwent a revision left below-knee amputation after she had a fall and distracted the previous amputation site. Her first BKA on that side was done on 07/23/2017. Further amount of necrotic tissue and bone was resected and primary closure was performed. she was discharged home on 08/15/2017 the patient is been using Santyl ointment on the right foot and the left below-knee amputation site is under the care of her surgeon Dr. Lemar LivingsBrandon Cain. She says her blood glucose control has been good and she still not smoking READMISSION 04/09/2019 This is a now 63 year old woman who we cared for in this clinic in 2018 predominantly with a left foot wound. She ultimately had a left BKA and a left U KA. More recently she developed wounds on her feet on the right. She underwent a right BKA on 01/19/2019 for gangrene of the right foot. Her nephew is present tells me that the staples were removed about 2 weeks after that and she had Steri-Strips placed. They noted some drainage under the Steri- Strips and some black discoloration.  This reopened with a fairly large wound on the medial aspect of the stump incision and a smaller eschar laterally. Looking through the notes there may have been a stump infection in April as well. They have been using wet-to-dry dressings to this area twice daily. They are referred here by vein and vascular Dr. Darrick Pennafields for our review. Past medical history includes type 2 diabetes with a history of polyneuropathy and PAD. Last hemoglobin A1c at 8.5. Hypertension hyperlipidemia, coronary artery disease, continued tobacco abuse at 1/2 pack/day and some degree of memory decline is listed on her problem list Not possible to do ABIs of course however it is listed that it 1.2 years ago the angiogram suggest that there was enough blood flow down to the level of the ankle. She has had a angioplasty of her right posterior tibial artery and the right tibioperoneal trunk in February 2020 and then a right transmetatarsal amputation in February 2020 before the below-knee amputation. I do not see specifically the angiogram which I will need to look up at a later time. Social history; patient lives alone although she has help from family for her dressings. She has a continued half a pack per day smoker 7/16; difficult wound on the right BKA stump. There is a large wound anteriorly and laterally a small satellite area. We have been using Santyl. She tells  me she is got her cigarette smoking down to 4 cigarettes a day 7/23; difficult wounds on the right BKA stump. There is a large wound anteriorly and medially and a smaller area laterally. We have been using Santyl. Family is changing this daily. Still smoking 4 cigarettes a day. 7/30; no real improvement in the area on the right BKA stump. There is a large wound anterior and medially and a much smaller wound laterally. Both of these have necrotic surface is certainly not viable over the large wound. Been using Santyl for several weeks without real improvement.  Changed to Iodoflex today 8/13-Is back at 2 weeks, the wound of the right BKA stump looks about the same perhaps marginally better, with some granulation tissue formation, we are using Iodoflex. The much smaller lateral wound is healed 9/3; the right BKA stump wound is slightly smaller. Surface quite a bit better. She has palpable popliteal pulses. We have been using Iodoflex. Her nephew to helps her with the dressings and washing of the wound I think they have made some progress 9/22; the right BKA stump is slightly smaller. Probably 80% of this as healthy granulation now. Debrided with Anasept and gauze no mechanical debridement. We have been ordering Iodoflex but apparently getting [facility] alg[facility] from [facility] who apparently do not have the Iodoflex 10/6; right BKA stump 2-week follow-up. We have been using Iodoflex but [facility] is providing an iodine-based polymer foam dressing called Iodoplex. These may not be comparable. They got a very large supply of this at home. 10/20; right BKA stump 2-week follow-up. Using Iodoplex at home. Changing this every second day. The wound is making nice progress measures smaller today 11/3; right BKA stump. 2-week follow-up. Using Iodoflex at home. For the first time recently a completely nonviable surface which was difficult to explain. No evidence of infection. Required debridement in spite of this reasonably bad news the surface area of the wound is better 11/17; right BKA stump. Using Iodoflex. Much better appearance this week required mechanical debridement last week none done today Electronic Signature(s) Signed: 08/25/2019 6:07:35 PM By: Baltazar Najjar MD Entered By: Baltazar Najjar on 08/25/2019 13:58:37 -------------------------------------------------------------------------------- Physical Exam Details Patient Name: Date of Service: [name]. 08/25/2019 12:30 PM Medical Record [id number] Patient Account  Number: [id number] Date of Birth/Sex: Treating RN: 1955-11-24 (63 y.o. [name] Primary Care Provider: [name] Other Clinician: Referring Provider: Treating Provider/Extender:Robson, Ivin Booty, Dellia Nims in Treatment: 19 Constitutional Sitting or standing Blood Pressure is within target range for patient.. Pulse regular and within target range for patient.Marland Kitchen Respirations regular, non-labored and within target range.. Temperature is normal and within the target range for the patient.Marland Kitchen Appears in no distress. Eyes Conjunctivae clear. No discharge.no icterus. Respiratory work of breathing is normal. Cardiovascular Popliteal pulses palpable. Integumentary (Hair, Skin) No evidence of infection around the wound. Psychiatric appears at normal baseline. Notes Wound exam; right BKA stump. No debridement required this week as opposed to last week. No evidence of surrounding infection. There is a nice rim of epithelialization here. Electronic Signature(s) Signed: 08/25/2019 6:07:35 PM By: Baltazar Najjar MD Entered By: Baltazar Najjar on 08/25/2019 13:59:33 -------------------------------------------------------------------------------- Physician Orders Details Patient Name: Date of Service: [name]. 08/25/2019 12:30 PM Medical Record [id number] Patient Account Number: [id number] Date of Birth/Sex: Treating RN: 1956-03-31 (63 y.o. [name] Primary Care Provider: [name] Other Clinician: Referring Provider: Treating Provider/Extender:Robson, Ivin Booty, Dellia Nims in Treatment: [id number] Verbal / Phone Orders: No Diagnosis  Coding ICD-10 Coding Code Description E11.51 Type 2 diabetes mellitus with diabetic peripheral angiopathy without gangrene E11.622 Type 2 diabetes mellitus with other skin ulcer L97.818 Non-pressure chronic ulcer of other part of right lower leg with other specified severity Z89.511 Acquired absence of right leg below  knee Z89.612 Acquired absence of left leg above knee Follow-up Appointments Return Appointment in 2 weeks. Dressing Change Frequency Change Dressing every other day. - home health to change twice a week. Wound Cleansing May shower and wash wound with soap and water. Primary Wound Dressing Wound #8 Right,Medial Amputation Site - Below Knee Other: - iodoflex in clinic, home health to apply Ioplex Secondary Dressing Wound #8 Right,Medial Amputation Site - Below Knee Kerlix/Rolled Gauze ABD pad Edema Control Elevate legs to the level of the heart or above for 30 minutes daily and/or when sitting, a frequency of: - 3-4 a day to aid in swelling. Additional Orders / Instructions Stop/Decrease Smoking Follow Nutritious Diet - increase protein and vegetables within diabetic diet. Home Health Continue Home Health skilled nursing for wound care. - [facility]. Electronic Signature(s) Signed: 08/25/2019 6:07:35 PM By: Baltazar Najjar MD Signed: 09/15/2019 2:50:47 PM By: Yevonne Pax RN Entered By: Yevonne Pax on 08/25/2019 13:47:29 -------------------------------------------------------------------------------- Problem List Details Patient Name: Date of Service: [name]. 08/25/2019 12:30 PM Medical Record [id number] Patient Account Number: [id number] Date of Birth/Sex: Treating RN: 01-23-1956 (63 y.o. [name] Primary Care Provider: [name] Other Clinician: Referring Provider: Treating Provider/Extender:Robson, Ivin Booty, Dellia Nims in Treatment: 19 Active Problems ICD-10 Evaluated Encounter Code Description Active Date Today Diagnosis E11.51 Type 2 diabetes mellitus with diabetic peripheral 04/09/2019 No Yes angiopathy without gangrene E11.622 Type 2 diabetes mellitus with other skin ulcer 04/09/2019 No Yes L97.818 Non-pressure chronic ulcer of other part of right lower 04/09/2019 No Yes leg with other specified severity Z89.511 Acquired absence of  right leg below knee 04/09/2019 No Yes Z89.612 Acquired absence of left leg above knee 04/09/2019 No Yes Inactive Problems Resolved Problems Electronic Signature(s) Signed: 08/25/2019 6:07:35 PM By: Baltazar Najjar MD Entered By: Baltazar Najjar on 08/25/2019 13:57:10 -------------------------------------------------------------------------------- Progress Note Details Patient Name: Date of Service: [name]. 08/25/2019 12:30 PM Medical Record [id number] Patient Account Number: [id number] Date of Birth/Sex: Treating RN: 1956-01-22 (63 y.o. [name] Primary Care Provider: [name] Other Clinician: Referring Provider: Treating Provider/Extender:Robson, Ivin Booty, Dellia Nims in Treatment: 19 Subjective History of Present Illness (HPI) The following HPI elements were documented for the patient's wound: Location: right foot Quality: Patient reports experiencing a dull pain to affected area(s). Severity: Patient states wound(s) are getting worse. Duration: Patient has had the wound for > 3 months prior to seeking treatment at the wound center Timing: Pain in wound is constant (hurts all the time) Context: the original wound on the right foot is the same and she's recently had a left below-knee amputation Modifying Factors: Patient wound(s)/ulcer(s) are worsening due to :significant pain at rest 62 year old diabetic patient with a past medical history of essential hypertension, hyper lipidemia, PTSD, was seen recently and a walk-in clinic a week ago for pain and ulcers on the feet which she's had for 2 months. The diagnosis of foot cellulitis was made and the patient was put on Bactrim DS for 14 days along with Neurontin and pain medication and asked to see the wound center. The patient has been seen by the endocrinologist [name] and noted that she has had diabetes since 1992 and has polyneuropathy and  peripheral arterial disease.She is a smoker and smokes  about a pack of cigarettes a day. She was recently referred to a dermatologist. Most recently she had a lower arterial duplex study done which showed the patient had no evidence of significant right or left lower extremity arterial disease based on the ABI which was 1.09 on the right and 1.01 on the left with triphasic flow except the left anterior tibial which suggest arterial occlusive disease. The bilateral toe brachial indices were abnormal with the right being 0.23 and the left being 0.24. the patient also had an arterial duplex examination which showed plaque bilaterally in the common femoral artery and the proximal superficial femoral artery and also in the popliteal artery and the right anterior tibial artery and the left posterior tibial artery. These tests were requested by her primary care physician Dr. Shirlean Mylararol Webb, but the patient and her caregiver who was her nephew say they did not receive any call back regarding the test nor have they been told to go to a vascular surgeon. 06/19/2017 -- was seen by Dr. Randie Heinzain who has scheduled her for a aortogram and runoff on 06/24/2017-- he suspects this is going to be microvascular disease in addition to her diabetic ulcers. The benefit from an angiogram may be just as a baseline study to see if there is any pedal work that can be done to help improve the blood flow. He also discussed that she may lose her toes or have more proximal amputation of her limbs. She was recently admitted to the hospital between 06/09/2017 and 06/11/2017 with uncontrolled diabetes mellitus type 2, hypertension, nicotine dependence, worsening leg leg pain. She was treated appropriately for cellulitis she was sent home on Clindamycin 300 mg 3 times a day. Left foot x-ray done during this admission showed no radiographic evidence of osteomyelitis. Last hemoglobin A1c was 11.6% She has given up smoking for the last week. 06/26/2017 -- she was seen by Dr. Lemar LivingsBrandon Cain  recently and an angiogram was done and found that she does not have any options for reconstruction but does have flow bilaterally to the ankles. He was hopeful that with wound care and smoking sensation she could heal her wounds. Procedure performed on 06/24/2017 was a aortogram with bilateral lower extremity runoff. The findings were that of a patent aortobiiliac segments with a 30% stenosis of the left SFA and otherwise patent runoff to the popliteal arteries bilaterally. Predominant runoff on the right is via the peroneal artery with not much flow to the right foot. On the left side dominant runoff is via the posterior tibial artery and there is also peroneal artery. There is no appreciable plantar arch on the left either. No intervention was undertaken. 07/17/2017 -- the [facility] her treatment for hyperbaric oxygen therapy. We will re- file the request. In the meanwhile she is scheduled to see Dr. Randie Heinzain on Friday for further discussions regarding amputation of her left forefoot. she was in the ER on 07/12/2017 and I have reviewed these notes including the workup and the treatment plan and I agree with this. 08/21/2017 -- the patient was in and out of the hospital after a revision surgery on 08/12/2017 where she underwent a revision left below-knee amputation after she had a fall and distracted the previous amputation site. Her first BKA on that side was done on 07/23/2017. Further amount of necrotic tissue and bone was resected and primary closure was performed. she was discharged home on 08/15/2017 the  patient is been using Santyl ointment on the right foot and the left below-knee amputation site is under the care of her surgeon [name]. She says her blood glucose control has been good and she still not smoking READMISSION 04/09/2019 This is a now 63 year old woman who we cared for in this clinic in 2018 predominantly with a left foot wound.  She ultimately had a left BKA and a left U KA. More recently she developed wounds on her feet on the right. She underwent a right BKA on 01/19/2019 for gangrene of the right foot. Her nephew is present tells me that the staples were removed about 2 weeks after that and she had Steri-Strips placed. They noted some drainage under the Steri- Strips and some black discoloration. This reopened with a fairly large wound on the medial aspect of the stump incision and a smaller eschar laterally. Looking through the notes there may have been a stump infection in April as well. They have been using wet-to-dry dressings to this area twice daily. They are referred here by vein and vascular Dr. Darrick Penna for our review. Past medical history includes type 2 diabetes with a history of polyneuropathy and PAD. Last hemoglobin A1c at 8.5. Hypertension hyperlipidemia, coronary artery disease, continued tobacco abuse at 1/2 pack/day and some degree of memory decline is listed on her problem list Not possible to do ABIs of course however it is listed that it 1.2 years ago the angiogram suggest that there was enough blood flow down to the level of the ankle. She has had a angioplasty of her right posterior tibial artery and the right tibioperoneal trunk in February 2020 and then a right transmetatarsal amputation in February 2020 before the below-knee amputation. I do not see specifically the angiogram which I will need to look up at a later time. Social history; patient lives alone although she has help from family for her dressings. She has a continued half a pack per day smoker 7/16; difficult wound on the right BKA stump. There is a large wound anteriorly and laterally a small satellite area. We have been using Santyl. She tells me she is got her cigarette smoking down to 4 cigarettes a day 7/23; difficult wounds on the right BKA stump. There is a large wound anteriorly and medially and a smaller area laterally. We  have been using Santyl. Family is changing this daily. Still smoking 4 cigarettes a day. 7/30; no real improvement in the area on the right BKA stump. There is a large wound anterior and medially and a much smaller wound laterally. Both of these have necrotic surface is certainly not viable over the large wound. Been using Santyl for several weeks without real improvement. Changed to Iodoflex today 8/13-Is back at 2 weeks, the wound of the right BKA stump looks about the same perhaps marginally better, with some granulation tissue formation, we are using Iodoflex. The much smaller lateral wound is healed 9/3; the right BKA stump wound is slightly smaller. Surface quite a bit better. She has palpable popliteal pulses. We have been using Iodoflex. Her nephew to helps her with the dressings and washing of the wound I think they have made some progress 9/22; the right BKA stump is slightly smaller. Probably 80% of this as healthy granulation now. Debrided with Anasept and gauze no mechanical debridement. We have been ordering Iodoflex but apparently getting [facility] alg[facility] who apparently do not have the Iodoflex 10/6; right BKA stump 2-week follow-up.  We have been using Iodoflex but [facility] is providing an iodine-based polymer foam dressing called Iodoplex. These may not be comparable. They got a very large supply of this at home. 10/20; right BKA stump 2-week follow-up. Using Iodoplex at home. Changing this every second day. The wound is making nice progress measures smaller today 11/3; right BKA stump. 2-week follow-up. Using Iodoflex at home. For the first time recently a completely nonviable surface which was difficult to explain. No evidence of infection. Required debridement in spite of this reasonably bad news the surface area of the wound is better 11/17; right BKA stump. Using Iodoflex. Much better appearance this week required mechanical debridement  last week none done today Objective Constitutional Sitting or standing Blood Pressure is within target range for patient.. Pulse regular and within target range for patient.Marland Kitchen Respirations regular, non-labored and within target range.. Temperature is normal and within the target range for the patient.Marland Kitchen Appears in no distress. Vitals Time Taken: 1:00 PM, Height: 62 in, Weight: 190 lbs, BMI: 34.7, Temperature: 98.5 F, Pulse: 73 bpm, Respiratory Rate: 16 breaths/min, Blood Pressure: 124/54 mmHg, Capillary Blood Glucose: 131 mg/dl. General Notes: glucose per pt report Eyes Conjunctivae clear. No discharge.no icterus. Respiratory work of breathing is normal. Cardiovascular Popliteal pulses palpable. Psychiatric appears at normal baseline. General Notes: Wound exam; right BKA stump. No debridement required this week as opposed to last week. No evidence of surrounding infection. There is a nice rim of epithelialization here. Integumentary (Hair, Skin) No evidence of infection around the wound. Wound #8 status is Open. Original cause of wound was Surgical Injury. The wound is located on the Right,Medial Amputation Site - Below Knee. The wound measures 1.2cm length x 2.5cm width x 0.1cm depth; 2.356cm^2 area and 0.236cm^3 volume. There is Fat Layer (Subcutaneous Tissue) Exposed exposed. There is no tunneling or undermining noted. There is a medium amount of serosanguineous drainage noted. The wound margin is flat and intact. There is large (67-100%) red, pink granulation within the wound bed. There is a small (1-33%) amount of necrotic tissue within the wound bed including Adherent Slough. 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 Dressing Change Frequency: Change Dressing every other day. - home health to change twice a week. Wound Cleansing: May shower and wash wound with soap and water. Primary Wound Dressing: Wound #8 Right,Medial Amputation Site - Below Knee: Other: - iodoflex in clinic, home health to apply Ioplex Secondary Dressing: Wound #8 Right,Medial Amputation Site - Below Knee: Kerlix/Rolled Gauze ABD pad Edema Control: Elevate legs to the level of the heart or above for 30 minutes daily and/or when sitting, a frequency of: - 3-4 a day to aid in swelling. Additional Orders / Instructions: Stop/Decrease Smoking Follow Nutritious Diet - increase protein and vegetables within diabetic diet. Home Health: Continue Home Health skilled nursing for wound care. - [facility]. 1. I am continuing Iodoflex 2. Depending on measurements next time may need to probe the wound area as this has a gritty surface to its on occasion which is more tactile than visual. 3. This really looks quite clean today. Electronic Signature(s) Signed: 08/25/2019 6:07:35 PM By: Baltazar Najjar MD Entered By: Baltazar Najjar on  08/25/2019 14:00:20 -------------------------------------------------------------------------------- SuperBill Details Patient Name: Date of Service: TRIANNA, LUPIEN 08/25/2019 Medical Record [id number] Patient Account Number: [id number] Date of Birth/Sex: Treating RN: 01/15/1956 (63 y.o. Orvan Falconer Primary Care Provider: Maurice Small Other Clinician: Referring Provider: Treating Provider/Extender:Robson, Michiel Cowboy, Judson Roch in Treatment: 19 Diagnosis Coding ICD-10 Codes Code Description E11.51 Type 2 diabetes mellitus with diabetic peripheral angiopathy without gangrene E11.622 Type 2 diabetes mellitus with other skin ulcer L97.818 Non-pressure chronic ulcer of other part of right  lower leg with other specified severity Z89.511 Acquired absence of right leg below knee Z89.612 Acquired absence of left leg above knee Facility Procedures The patient participates with Medicare or their insurance follows the Medicare Facility Guidelines: CPT4 Code Description Modifier Quantity [id number] [phone] - WOUND CARE VISIT-LEV 3 EST PT 1 Physician Procedures CPT4 Code Description: [id number] - WC PHYS LEVEL 3 - EST PT ICD-10 Diagnosis Description L97.818 Non-pressure chronic ulcer of other part of right lower leg wit severity E11.622 Type 2 diabetes mellitus with other skin ulcer Modifier: 1 h other specified Quantity: Electronic Signature(s) Signed: 08/25/2019 6:07:35 PM By: Linton Ham MD Entered By: Linton Ham on 08/25/2019 14:00:39

## 2019-09-15 NOTE — Progress Notes (Signed)
Danielle Buchanan, Danielle Buchanan ([id number]) Visit Report for 07/28/2019 HPI Details Patient Name: Date of Service: Danielle Buchanan, Danielle Buchanan 07/28/2019 11:00 AM Medical Record [id number] Patient Account Number: [id number] Date of Birth/Sex: Treating RN: 01/02/1956 (63 y.o. [name] Primary Care Provider: [name] Other Clinician: Referring Provider: Treating Provider/Extender:Anie Juniel, Ivin Booty, Dellia Nims in Treatment: 15 History of Present Illness Location: right foot Quality: Patient reports experiencing a dull pain to affected area(s). Severity: Patient states wound(s) are getting worse. Duration: Patient has had the wound for > 3 months prior to seeking treatment at the wound center Timing: Pain in wound is constant (hurts all the time) Context: the original wound on the right foot is the same and she's recently had a left below-knee amputation Modifying Factors: Patient wound(s)/ulcer(s) are worsening due to :significant pain at rest HPI Description: 63 year old diabetic patient with a past medical history of essential hypertension, hyper lipidemia, PTSD, was seen recently and a walk-in clinic a week ago for pain and ulcers on the feet which she's had for 2 months. The diagnosis of foot cellulitis was made and the patient was put on Bactrim DS for 14 days along with Neurontin and pain medication and asked to see the wound center. The patient has been seen by the endocrinologist [name] and noted that she has had diabetes since 1992 and has polyneuropathy and peripheral arterial disease.She is a smoker and smokes about a pack of cigarettes a day. She was recently referred to a dermatologist. Most recently she had a lower arterial duplex study done which showed the patient had no evidence of significant right or left lower extremity arterial disease based on the ABI which was 1.09 on the right and 1.01 on the left with triphasic flow except the left anterior tibial which  suggest arterial occlusive disease. The bilateral toe brachial indices were abnormal with the right being 0.23 and the left being 0.24. the patient also had an arterial duplex examination which showed plaque bilaterally in the common femoral artery and the proximal superficial femoral artery and also in the popliteal artery and the right anterior tibial artery and the left posterior tibial artery. These tests were requested by her primary care physician Dr. [name], but the patient and her caregiver who was her nephew say they did not receive any call back regarding the test nor have they been told to go to a vascular surgeon. 06/19/2017 -- was seen by [name] who has scheduled her for a aortogram and runoff on 06/24/2017-- he suspects this is going to be microvascular disease in addition to her diabetic ulcers. The benefit from an angiogram may be just as a baseline study to see if there is any pedal work that can be done to help improve the blood flow. He also discussed that she may lose her toes or have more proximal amputation of her limbs. She was recently admitted to the hospital between 06/09/2017 and 06/11/2017 with uncontrolled diabetes mellitus type 2, hypertension, nicotine dependence, worsening leg leg pain. She was treated appropriately for cellulitis she was sent home on Clindamycin 300 mg 3 times a day. Left foot x-ray done during this admission showed no radiographic evidence of osteomyelitis. Last hemoglobin A1c was 11.6% She has given up smoking for the last week. 06/26/2017 -- she was seen by [name] recently and an angiogram was done and found that she does not have any options for reconstruction but does have flow bilaterally to the ankles. He was hopeful that  with wound care and smoking sensation she could heal her wounds. Procedure performed on 06/24/2017 was a aortogram with bilateral lower extremity runoff. The findings were that of a patent aortobiiliac  segments with a 30% stenosis of the left SFA and otherwise patent runoff to the popliteal arteries bilaterally. Predominant runoff on the right is via the peroneal artery with not much flow to the right foot. On the left side dominant runoff is via the posterior tibial artery and there is also peroneal artery. There is no appreciable plantar arch on the left either. No intervention was undertaken. 07/17/2017 -- the initial paperwork to [facility] has denied her treatment for hyperbaric oxygen therapy. We will re- file the request. In the meanwhile she is scheduled to see Dr. Randie Heinzain on Friday for further discussions regarding amputation of her left forefoot. she was in the ER on 07/12/2017 and I have reviewed these notes including the workup and the treatment plan and I agree with this. 08/21/2017 -- the patient was in and out of the hospital after a revision surgery on 08/12/2017 where she underwent a revision left below-knee amputation after she had a fall and distracted the previous amputation site. Her first BKA on that side was done on 07/23/2017. Further amount of necrotic tissue and bone was resected and primary closure was performed. she was discharged home on 08/15/2017 the patient is been using Santyl ointment on the right foot and the left below-knee amputation site is under the care of her surgeon Dr. Lemar LivingsBrandon Cain. She says her blood glucose control has been good and she still not smoking READMISSION 04/09/2019 This is a now 63 year old woman who we cared for in this clinic in 2018 predominantly with a left foot wound. She ultimately had a left BKA and a left U KA. More recently she developed wounds on her feet on the right. She underwent a right BKA on 01/19/2019 for gangrene of the right foot. Her nephew is present tells me that the staples were removed about 2 weeks after that and she had Steri-Strips placed. They noted some drainage under the Steri- Strips and some black discoloration.  This reopened with a fairly large wound on the medial aspect of the stump incision and a smaller eschar laterally. Looking through the notes there may have been a stump infection in April as well. They have been using wet-to-dry dressings to this area twice daily. They are referred here by vein and vascular Dr. Darrick Pennafields for our review. Past medical history includes type 2 diabetes with a history of polyneuropathy and PAD. Last hemoglobin A1c at 8.5. Hypertension hyperlipidemia, coronary artery disease, continued tobacco abuse at 1/2 pack/day and some degree of memory decline is listed on her problem list Not possible to do ABIs of course however it is listed that it 1.2 years ago the angiogram suggest that there was enough blood flow down to the level of the ankle. She has had a angioplasty of her right posterior tibial artery and the right tibioperoneal trunk in February 2020 and then a right transmetatarsal amputation in February 2020 before the below-knee amputation. I do not see specifically the angiogram which I will need to look up at a later time. Social history; patient lives alone although she has help from family for her dressings. She has a continued half a pack per day smoker 7/16; difficult wound on the right BKA stump. There is a large wound anteriorly and laterally a small satellite area. We have been using Santyl. She tells  me she is got her cigarette smoking down to 4 cigarettes a day 7/23; difficult wounds on the right BKA stump. There is a large wound anteriorly and medially and a smaller area laterally. We have been using Santyl. Family is changing this daily. Still smoking 4 cigarettes a day. 7/30; no real improvement in the area on the right BKA stump. There is a large wound anterior and medially and a much smaller wound laterally. Both of these have necrotic surface is certainly not viable over the large wound. Been using Santyl for several weeks without real improvement.  Changed to Iodoflex today 8/13-Is back at 2 weeks, the wound of the right BKA stump looks about the same perhaps marginally better, with some granulation tissue formation, we are using Iodoflex. The much smaller lateral wound is healed 9/3; the right BKA stump wound is slightly smaller. Surface quite a bit better. She has palpable popliteal pulses. We have been using Iodoflex. Her nephew to helps her with the dressings and washing of the wound I think they have made some progress 9/22; the right BKA stump is slightly smaller. Probably 80% of this as healthy granulation now. Debrided with Anasept and gauze no mechanical debridement. We have been ordering Iodoflex but apparently getting [facility] alg[facility] from [facility] who apparently do not have the Iodoflex 10/6; right BKA stump 2-week follow-up. We have been using Iodoflex but [facility] is providing an iodine-based polymer foam dressing called Iodoplex. These may not be comparable. They got a very large supply of this at home. 10/20; right BKA stump 2-week follow-up. Using Iodoplex at home. Changing this every second day. The wound is making nice progress measures smaller today Electronic Signature(s) Signed: 07/28/2019 6:17:56 PM By: Baltazar Najjar MD Entered By: Baltazar Najjar on 07/28/2019 12:55:48 -------------------------------------------------------------------------------- Physical Exam Details Patient Name: Date of Service: Danielle Buchanan. 07/28/2019 11:00 AM Medical Record [id number] Patient Account Number: [id number] Date of Birth/Sex: Treating RN: 11/22/1955 (63 y.o. [name] Primary Care Provider: [name] Other Clinician: Referring Provider: Treating Provider/Extender:Viktoriya Glaspy, Ivin Booty, Dellia Nims in Treatment: 15 Constitutional Sitting or standing Blood Pressure is within target range for patient.. Pulse regular and within target range for patient.Marland Kitchen Respirations regular,  non-labored and within target range.. Temperature is normal and within the target range for the patient.Marland Kitchen Appears in no distress. Notes Wound exam; right BKA stump. Healthy looking surface. No debridement was done. No evidence of surrounding infection Electronic Signature(s) Signed: 07/28/2019 6:17:56 PM By: Baltazar Najjar MD Entered By: Baltazar Najjar on 07/28/2019 13:40:17 -------------------------------------------------------------------------------- Physician Orders Details Patient Name: Date of Service: Danielle Buchanan. 07/28/2019 11:00 AM Medical Record [id number] Patient Account Number: [id number] Date of Birth/Sex: Treating RN: 1956-05-03 (63 y.o. [name] Primary Care Provider: [name] Other Clinician: Referring Provider: Treating Provider/Extender:Shaquita Fort, Ivin Booty, Dellia Nims in Treatment: 15 Verbal / Phone Orders: No Diagnosis Coding ICD-10 Coding Code Description E11.51 Type 2 diabetes mellitus with diabetic peripheral angiopathy without gangrene E11.622 Type 2 diabetes mellitus with other skin ulcer L97.818 Non-pressure chronic ulcer of other part of right lower leg with other specified severity Z89.511 Acquired absence of right leg below knee Z89.612 Acquired absence of left leg above knee Follow-up Appointments Return Appointment in 2 weeks. Dressing Change Frequency Change Dressing every other day. - home health to change twice a week. Wound Cleansing May shower and wash wound with soap and water. Primary Wound Dressing Wound #8 Right,Medial Amputation Site - Below Knee Other: - Ioplex Secondary Dressing  Wound #8 Right,Medial Amputation Site - Below Knee Kerlix/Rolled Gauze ABD pad Edema Control Elevate legs to the level of the heart or above for 30 minutes daily and/or when sitting, a frequency of: - 3-4 a day to aid in swelling. Additional Orders / Instructions Stop/Decrease Smoking Follow Nutritious Diet - increase protein  and vegetables within diabetic diet. [facility] for wound care. - [facility]. Electronic Signature(s) Signed: 07/28/2019 6:17:56 PM By: Linton Ham MD Signed: 09/15/2019 3:01:15 PM By: [name] RN Entered By: [name] on 07/28/2019 11:10:15 -------------------------------------------------------------------------------- Problem List Details Patient Name: Date of Service: Danielle Buchanan. 07/28/2019 11:00 AM Medical Record [id number] Patient Account Number: [id number] Date of Birth/Sex: Treating RN: 1955/10/23 (64 y.o. Orvan Falconer Primary Care Provider: Maurice Small Other Clinician: Referring Provider: Treating Provider/Extender:Hussam Muniz, Michiel Cowboy, Judson Roch in Treatment: 15 Active Problems ICD-10 Evaluated Encounter Code Description Active Date Today Diagnosis E11.51 Type 2 diabetes mellitus with diabetic peripheral 04/09/2019 No Yes angiopathy without gangrene E11.622 Type 2 diabetes mellitus with other skin ulcer 04/09/2019 No Yes L97.818 Non-pressure chronic ulcer of other part of right lower 04/09/2019 No Yes leg with other specified severity Z89.511 Acquired absence of right leg below knee 04/09/2019 No Yes Z89.612 Acquired absence of left leg above knee 04/09/2019 No Yes Inactive Problems Resolved Problems Electronic Signature(s) Signed: 07/28/2019 6:17:56 PM By: Linton Ham MD Entered By: Linton Ham on 07/28/2019 12:55:00 -------------------------------------------------------------------------------- Progress Note Details Patient Name: Date of Service: Danielle Buchanan. 07/28/2019 11:00 AM Medical Record [id number] Patient Account Number: [id number] Date of Birth/Sex: Treating RN: 1955/12/03 (63 y.o. Orvan Falconer Primary Care Provider: Maurice Small Other Clinician: Referring Provider: Treating Provider/Extender:Loraine Freid, Michiel Cowboy, Judson Roch in Treatment: 15 Subjective History of  Present Illness (HPI) The following HPI elements were documented for the patient's wound: Location: right foot Quality: Patient reports experiencing a dull pain to affected area(s). Severity: Patient states wound(s) are getting worse. Duration: Patient has had the wound for > 3 months prior to seeking treatment at the wound center Timing: Pain in wound is constant (hurts all the time) Context: the original wound on the right foot is the same and she's recently had a left below-knee amputation Modifying Factors: Patient wound(s)/ulcer(s) are worsening due to :significant pain at rest 63 year old diabetic patient with a past medical history of essential hypertension, hyper lipidemia, PTSD, was seen recently and a walk-in clinic a week ago for pain and ulcers on the feet which she's had for 2 months. The diagnosis of foot cellulitis was made and the patient was put on Bactrim DS for 14 days along with Neurontin and pain medication and asked to see the wound center. The patient has been seen by the endocrinologist Dr. Renato Shin and noted that she has had diabetes since 1992 and has polyneuropathy and peripheral arterial disease.She is a smoker and smokes about a pack of cigarettes a day. She was recently referred to a dermatologist. Most recently she had a lower arterial duplex study done which showed the patient had no evidence of significant right or left lower extremity arterial disease based on the ABI which was 1.09 on the right and 1.01 on the left with triphasic flow except the left anterior tibial which suggest arterial occlusive disease. The bilateral toe brachial indices were abnormal with the right being 0.23 and the left being 0.24. the patient also had an arterial duplex examination which showed plaque bilaterally in the common femoral artery and the proximal  superficial femoral artery and also in the popliteal artery and the right anterior tibial artery and the left posterior tibial  artery. These tests were requested by her primary care physician Dr. [name], but the patient and her caregiver who was her nephew say they did not receive any call back regarding the test nor have they been told to go to a vascular surgeon. 06/19/2017 -- was seen by [name] who has scheduled her for a aortogram and runoff on 06/24/2017-- he suspects this is going to be microvascular disease in addition to her diabetic ulcers. The benefit from an angiogram may be just as a baseline study to see if there is any pedal work that can be done to help improve the blood flow. He also discussed that she may lose her toes or have more proximal amputation of her limbs. She was recently admitted to the hospital between 06/09/2017 and 06/11/2017 with uncontrolled diabetes mellitus type 2, hypertension, nicotine dependence, worsening leg leg pain. She was treated appropriately for cellulitis she was sent home on Clindamycin 300 mg 3 times a day. Left foot x-ray done during this admission showed no radiographic evidence of osteomyelitis. Last hemoglobin A1c was 11.6% She has given up smoking for the last week. 06/26/2017 -- she was seen by [name] recently and an angiogram was done and found that she does not have any options for reconstruction but does have flow bilaterally to the ankles. He was hopeful that with wound care and smoking sensation she could heal her wounds. Procedure performed on 06/24/2017 was a aortogram with bilateral lower extremity runoff. The findings were that of a patent aortobiiliac segments with a 30% stenosis of the left SFA and otherwise patent runoff to the popliteal arteries bilaterally. Predominant runoff on the right is via the peroneal artery with not much flow to the right foot. On the left side dominant runoff is via the posterior tibial artery and there is also peroneal artery. There is no appreciable plantar arch on the left either. No intervention was  undertaken. 07/17/2017 -- the initial paperwork to [facility] has denied her treatment for hyperbaric oxygen therapy. We will re- file the request. In the meanwhile she is scheduled to see [name] on Friday for further discussions regarding amputation of her left forefoot. she was in the ER on 07/12/2017 and I have reviewed these notes including the workup and the treatment plan and I agree with this. 08/21/2017 -- the patient was in and out of the hospital after a revision surgery on 08/12/2017 where she underwent a revision left below-knee amputation after she had a fall and distracted the previous amputation site. Her first BKA on that side was done on 07/23/2017. Further amount of necrotic tissue and bone was resected and primary closure was performed. she was discharged home on 08/15/2017 the patient is been using Santyl ointment on the right foot and the left below-knee amputation site is under the care of her surgeon [name]. She says her blood glucose control has been good and she still not smoking READMISSION 04/09/2019 This is a now 63 year old woman who we cared for in this clinic in 2018 predominantly with a left foot wound. She ultimately had a left BKA and a left U KA. More recently she developed wounds on her feet on the right. She underwent a right BKA on 01/19/2019 for gangrene of the right foot. Her nephew is present tells me that the staples were removed about 2 weeks after  that and she had Steri-Strips placed. They noted some drainage under the Steri- Strips and some black discoloration. This reopened with a fairly large wound on the medial aspect of the stump incision and a smaller eschar laterally. Looking through the notes there may have been a stump infection in April as well. They have been using wet-to-dry dressings to this area twice daily. They are referred here by vein and vascular Dr. Darrick Penna for our review. Past medical history includes type 2 diabetes with a  history of polyneuropathy and PAD. Last hemoglobin A1c at 8.5. Hypertension hyperlipidemia, coronary artery disease, continued tobacco abuse at 1/2 pack/day and some degree of memory decline is listed on her problem list Not possible to do ABIs of course however it is listed that it 1.2 years ago the angiogram suggest that there was enough blood flow down to the level of the ankle. She has had a angioplasty of her right posterior tibial artery and the right tibioperoneal trunk in February 2020 and then a right transmetatarsal amputation in February 2020 before the below-knee amputation. I do not see specifically the angiogram which I will need to look up at a later time. Social history; patient lives alone although she has help from family for her dressings. She has a continued half a pack per day smoker 7/16; difficult wound on the right BKA stump. There is a large wound anteriorly and laterally a small satellite area. We have been using Santyl. She tells me she is got her cigarette smoking down to 4 cigarettes a day 7/23; difficult wounds on the right BKA stump. There is a large wound anteriorly and medially and a smaller area laterally. We have been using Santyl. Family is changing this daily. Still smoking 4 cigarettes a day. 7/30; no real improvement in the area on the right BKA stump. There is a large wound anterior and medially and a much smaller wound laterally. Both of these have necrotic surface is certainly not viable over the large wound. Been using Santyl for several weeks without real improvement. Changed to Iodoflex today 8/13-Is back at 2 weeks, the wound of the right BKA stump looks about the same perhaps marginally better, with some granulation tissue formation, we are using Iodoflex. The much smaller lateral wound is healed 9/3; the right BKA stump wound is slightly smaller. Surface quite a bit better. She has palpable popliteal pulses. We have been using Iodoflex. Her nephew to  helps her with the dressings and washing of the wound I think they have made some progress 9/22; the right BKA stump is slightly smaller. Probably 80% of this as healthy granulation now. Debrided with Anasept and gauze no mechanical debridement. We have been ordering Iodoflex but apparently getting [facility] who apparently do not have the Iodoflex 10/6; right BKA stump 2-week follow-up. We have been using Iodoflex but [facility] is providing an iodine-based polymer foam dressing called Iodoplex. These may not be comparable. They got a very large supply of this at home. 10/20; right BKA stump 2-week follow-up. Using Iodoplex at home. Changing this every second day. The wound is making nice progress measures smaller today Objective Constitutional Sitting or standing Blood Pressure is within target range for patient.. Pulse regular and within target range for patient.Marland Kitchen Respirations regular, non-labored and within target range.. Temperature is normal and within the target range for the patient.Marland Kitchen Appears in no distress. Vitals Time Taken: 11:32 AM, Height: 62 in, Weight: 190 lbs, BMI: 34.7,  Temperature: 98.3 F, Pulse: 68 bpm, Respiratory Rate: 18 breaths/min, Blood Pressure: 125/57 mmHg, Capillary Blood Glucose: 130 mg/dl. General Notes: glucose per pt report General Notes: Wound exam; right BKA stump. Healthy looking surface. No debridement was done. No evidence of surrounding infection Integumentary (Hair, Skin) Wound #8 status is Open. Original cause of wound was Surgical Injury. The wound is located on the Right,Medial Amputation Site - Below Knee. The wound measures 1.7cm length x 3.2cm width x 0.1cm depth; 4.273cm^2 area and 0.427cm^3 volume. There is Fat Layer (Subcutaneous Tissue) Exposed exposed. There is no tunneling or undermining noted. There is a medium amount of serosanguineous drainage noted. The wound margin is flat and intact. There is  large (67-100%) red, pink granulation within the wound bed. There is a small (1-33%) amount of necrotic tissue within the wound bed including Adherent Slough. 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 Dressing Change Frequency: Change Dressing every other day. - home health to change twice a week. Wound Cleansing: May shower and wash wound with soap and water. Primary Wound Dressing: Wound #8 Right,Medial Amputation Site - Below Knee: Other: - Ioplex Secondary Dressing: Wound #8 Right,Medial Amputation Site - Below Knee: Kerlix/Rolled Gauze ABD pad Edema Control: Elevate legs to the level of the heart or above for 30 minutes daily and/or when sitting, a frequency of: - 3-4 a day to aid in swelling. Additional Orders / Instructions: Stop/Decrease Smoking Follow Nutritious Diet - increase protein and vegetables within diabetic diet. Home Health: Continue Home Health skilled nursing for wound care. - [facility]. 1. Iodoplex; nice improvement. Changing every second day appears to be doing very well Electronic Signature(s) Signed: 07/28/2019 6:17:56 PM By: Baltazar Najjar MD Entered By: Baltazar Najjar on 07/28/2019 13:41:27 -------------------------------------------------------------------------------- SuperBill Details Patient Name: Date of Service: Danielle Buchanan 07/28/2019 Medical Record [id number] Patient Account Number: [id number] Date of Birth/Sex: Treating RN: 05/15/1956 (63 y.o. [name] Primary Care Provider: [name] Other Clinician: Referring Provider: Treating Provider/Extender:Bruno Leach,  Ivin Booty, Dellia Nims in Treatment: 15 Diagnosis Coding ICD-10 Codes Code Description E11.51 Type 2 diabetes mellitus with diabetic peripheral angiopathy without gangrene E11.622 Type 2 diabetes mellitus with other skin ulcer L97.818 Non-pressure chronic ulcer of other part of right lower leg with other specified severity Z89.511 Acquired absence of right leg below knee Z89.612 Acquired absence of left leg above knee Facility Procedures The patient participates with Medicare or their insurance follows the Medicare Facility Guidelines: CPT4 Code Description Modifier Quantity [id number] [phone] - WOUND CARE VISIT-LEV 3 EST PT 1 Physician Procedures CPT4 Code Description: [id number] - WC PHYS LEVEL 2 - EST PT ICD-10 Diagnosis Description E11.622 Type 2 diabetes mellitus with other skin ulcer L97.818 Non-pressure chronic ulcer of other part of right lower leg w severity Modifier: ith other specified Quantity: 1 Electronic Signature(s) Signed: 07/28/2019 6:17:56 PM By: Baltazar Najjar MD Entered By: Baltazar Najjar on 07/28/2019 13:41:48

## 2019-09-15 NOTE — Progress Notes (Signed)
KIMYATTA, LECY ([id number]) Visit Report for 07/28/2019 Arrival Information Details Patient Name: Date of Service: MUNIRA, POLSON 07/28/2019 11:00 AM Medical Record [id number] Patient Account Number: [id number] Date of Birth/Sex: Treating RN: 1956-08-07 (63 y.o. Wynelle Link Primary Care Treyveon Mochizuki: [name] Other Clinician: Referring Maico Mulvehill: Treating Jenisa Monty/Extender:Robson, Ivin Booty, Dellia Nims in Treatment: 15 Visit Information History Since Last Visit Added or deleted any medications: No Patient Arrived: Wheel Chair Any new allergies or adverse reactions: No Arrival Time: 11:29 Had a fall or experienced change in No activities of daily living that may affect Accompanied By: son risk of falls: Transfer Assistance: None Signs or symptoms of abuse/neglect since last No Patient Identification Verified: Yes visito Secondary Verification Process Completed: Yes Hospitalized since last visit: No Patient Requires Transmission-Based No Implantable device outside of the clinic excluding No Precautions: cellular tissue based products placed in the center Patient Has Alerts: No since last visit: Has Dressing in Place as Prescribed: Yes Pain Present Now: No Electronic Signature(s) Signed: 07/29/2019 6:50:57 PM By: Zandra Abts RN, BSN Entered By: Zandra Abts on 07/28/2019 11:30:25 -------------------------------------------------------------------------------- Clinic Level of Care Assessment Details Patient Name: Date of Service: [name]. 07/28/2019 11:00 AM Medical Record [id number] Patient Account Number: [id number] Date of Birth/Sex: Treating RN: 01/01/1956 (63 y.o. [name] Primary Care Tyler Cubit: [name] Other Clinician: Referring Aniya Jolicoeur: Treating Pink Maye/Extender:Robson, Ivin Booty, Dellia Nims in Treatment: 15 Clinic Level of Care Assessment Items TOOL 4 Quantity Score X - Use when only an EandM is performed  on FOLLOW-UP visit 1 0 ASSESSMENTS - Nursing Assessment / Reassessment X - Reassessment of Co-morbidities (includes updates in patient status) 1 10 X - Reassessment of Adherence to Treatment Plan 1 5 ASSESSMENTS - Wound and Skin Assessment / Reassessment X - Simple Wound Assessment / Reassessment - one wound 1 5  - Complex Wound Assessment / Reassessment - multiple wounds 0  - Dermatologic / Skin Assessment (not related to wound area) 0 ASSESSMENTS - Focused Assessment  - Circumferential Edema Measurements - multi extremities 0  - Nutritional Assessment / Counseling / Intervention 0  - Lower Extremity Assessment (monofilament, tuning fork, pulses) 0  - Peripheral Arterial Disease Assessment (using hand held doppler) 0 ASSESSMENTS - Ostomy and/or Continence Assessment and Care  - Incontinence Assessment and Management 0  - Ostomy Care Assessment and Management (repouching, etc.) 0 PROCESS - Coordination of Care X - Simple Patient / Family Education for ongoing care 1 15  - Complex (extensive) Patient / Family Education for ongoing care 0 X - Staff obtains Chiropractor, Records, Test Results / Process Orders 1 10  - Staff telephones HHA, Nursing Homes / Clarify orders / etc 0  - Routine Transfer to another Facility (non-emergent condition) 0  - Routine Hospital Admission (non-emergent condition) 0  - New Admissions / Manufacturing engineer / Ordering NPWT, Apligraf, etc. 0  - Emergency Hospital Admission (emergent condition) 0 X - Simple Discharge Coordination 1 10  - Complex (extensive) Discharge Coordination 0 PROCESS - Special Needs  - Pediatric / Minor Patient Management 0  - Isolation Patient Management 0  - Hearing / Language / Visual special needs 0  - Assessment of Community assistance (transportation, D/C planning, etc.) 0  - Additional assistance / Altered mentation 0  - Support Surface(s) Assessment (bed, cushion, seat, etc.)  0 INTERVENTIONS - Wound Cleansing / Measurement X - Simple Wound Cleansing - one wound 1 5  - Complex Wound Cleansing - multiple wounds 0 X - Wound  Imaging (photographs - any number of wounds) 1 5 []  - Wound Tracing (instead of photographs) 0 X - Simple Wound Measurement - one wound 1 5 []  - Complex Wound Measurement - multiple wounds 0 INTERVENTIONS - Wound Dressings []  - Small Wound Dressing one or multiple wounds 0 X - Medium Wound Dressing one or multiple wounds 1 15 []  - Large Wound Dressing one or multiple wounds 0 []  - Application of Medications - topical 0 []  - Application of Medications - injection 0 INTERVENTIONS - Miscellaneous []  - External ear exam 0 []  - Specimen Collection (cultures, biopsies, blood, body fluids, etc.) 0 []  - Specimen(s) / Culture(s) sent or taken to Lab for analysis 0 []  - Patient Transfer (multiple staff / Nurse, adultHoyer Lift / Similar devices) 0 []  - Simple Staple / Suture removal (25 or less) 0 []  - Complex Staple / Suture removal (26 or more) 0 []  - Hypo / Hyperglycemic Management (close monitor of Blood Glucose) 0 []  - Ankle / Brachial Index (ABI) - do not check if billed separately 0 X - Vital Signs 1 5 Has the patient been seen at the hospital within the last three years: Yes Total Score: 90 Level Of Care: New/Established - Level 3 Electronic Signature(s) Signed: 09/15/2019 3:01:15 PM By: Yevonne PaxEpps, Carrie RN Entered By: Yevonne PaxEpps, Carrie on 07/28/2019 12:20:58 -------------------------------------------------------------------------------- Encounter Discharge Information Details Patient Name: Date of Service: Aldine ContesWEINKE, Phuong M. 07/28/2019 11:00 AM Medical Record [id number] Patient Account Number: [id number] Date of Birth/Sex: Treating RN: 25-Sep-1956 (63 y.o. Wynelle LinkF) Lynch, Shatara Primary Care Valli Randol: Shirlean MylarWebb, Carol Other Clinician: Referring Dia Donate: Treating Gwin Eagon/Extender:Robson, Ivin BootyMichael Webb, Dellia Nimsarol Weeks in Treatment: 15 Encounter Discharge  Information Items Discharge Condition: Stable Ambulatory Status: Wheelchair Discharge Destination: Home Transportation: Private Auto Accompanied By: son Schedule Follow-up Appointment: Yes Clinical Summary of Care: Patient Declined Electronic Signature(s) Signed: 07/29/2019 6:50:57 PM By: Zandra AbtsLynch, Shatara RN, BSN Entered By: Zandra AbtsLynch, Shatara on 07/28/2019 13:48:58 -------------------------------------------------------------------------------- Multi Wound Chart Details Patient Name: Date of Service: Aldine ContesWEINKE, Loan M. 07/28/2019 11:00 AM Medical Record [id number] Patient Account Number: [id number] Date of Birth/Sex: Treating RN: 25-Sep-1956 (63 y.o. [name]) Epps, Carrie Primary Care Eytan Carrigan: Shirlean MylarWebb, Carol Other Clinician: Referring Danetta Prom: Treating Wylene Weissman/Extender:Robson, Ivin BootyMichael Webb, Dellia Nimsarol Weeks in Treatment: 15 Vital Signs Height(in): 62 Capillary Blood 130 Glucose(mg/dl): Weight(lbs): 528190 Pulse(bpm): 68 Body Mass Index(BMI): 35 Blood Pressure(mmHg): 125/57 Temperature(F): 98.3 Respiratory 18 Rate(breaths/min): Photos: [8:No Photos] [N/A:N/A] Wound Location: [8:Right Amputation Site - Below Knee - Medial] [N/A:N/A] Wounding Event: [8:Surgical Injury] [N/A:N/A] Primary Etiology: [8:Dehisced Wound] [N/A:N/A] Comorbid History: [8:Hypertension, Peripheral Arterial Disease, Type II Diabetes, Neuropathy] [N/A:N/A] Date Acquired: [8:04/01/2019] [N/A:N/A] Weeks of Treatment: [8:15] [N/A:N/A] Wound Status: [8:Open] [N/A:N/A] Measurements L x W x D [8:1.7x3.2x0.1] [N/A:N/A] (cm) Area (cm) : [8:4.273] [N/A:N/A] Volume (cm) : [8:0.427] [N/A:N/A] % Reduction in Area: [8:57.00%] [N/A:N/A] % Reduction in Volume: [8:57.00%] [N/A:N/A] Classification: [8:Full Thickness Without Exposed Support Structures] [N/A:N/A] Exudate Amount: [8:Medium] [N/A:N/A] Exudate Type: [8:Serosanguineous] [N/A:N/A] Exudate Color: [8:red, brown] [N/A:N/A] Wound Margin: [8:Flat and Intact]  [N/A:N/A] Granulation Amount: [8:Large (67-100%)] [N/A:N/A] Granulation Quality: [8:Red, Pink] [N/A:N/A] Necrotic Amount: [8:Small (1-33%)] [N/A:N/A] Exposed Structures: [8:Fat Layer (Subcutaneous N/A Tissue) Exposed: Yes Fascia: No Tendon: No Muscle: No Joint: No Bone: No Small (1-33%)] [N/A:N/A] Treatment Notes Electronic Signature(s) Signed: 07/28/2019 6:17:56 PM By: Baltazar Najjarobson, Michael MD Signed: 09/15/2019 3:01:15 PM By: Yevonne PaxEpps, Carrie RN Entered By: Baltazar Najjarobson, Michael on 07/28/2019 12:55:09 -------------------------------------------------------------------------------- Multi-Disciplinary Care Plan Details Patient Name: Date of Service: Aldine ContesWEINKE, Veona M. 07/28/2019 11:00 AM Medical Record [id number] Patient Account Number: [id number]  Date of Birth/Sex: Treating RN: [birth year] (63 y.o. Orvan Falconer Primary Care Lamekia Nolden: Maurice Small Other Clinician: Referring Izaac Reisig: Treating Lebert Lovern/Extender:Robson, Michiel Cowboy, Judson Roch in Treatment: 15 Active Inactive Pain, Acute or Chronic Nursing Diagnoses: Pain, acute or chronic: actual or potential Potential alteration in comfort, pain Goals: Patient will verbalize adequate pain control and receive pain control interventions during procedures as needed Date Initiated: 04/09/2019 Target Resolution Date: 08/14/2019 Goal Status: Active Interventions: Provide education on pain management Reposition patient for comfort Treatment Activities: Administer pain control measures as ordered : 04/09/2019 Notes: Electronic Signature(s) Signed: 09/15/2019 3:01:15 PM By: [name] RN Entered By: [name] on 07/28/2019 11:10:25 -------------------------------------------------------------------------------- Pain Assessment Details Patient Name: Date of Service: Franky Macho. 07/28/2019 11:00 AM Medical Record [id number] Patient Account Number: [id number] Date of Birth/Sex: Treating RN: 1956-02-28 (63 y.o. Nancy Fetter Primary Care Rogen Porte: Maurice Small Other Clinician: Referring Preslea Rhodus: Treating Enrico Eaddy/Extender:Robson, Michiel Cowboy, Judson Roch in Treatment: 15 Active Problems Location of Pain Severity and Description of Pain Patient Has Paino No Site Locations Pain Management and Medication Current Pain Management: Electronic Signature(s) Signed: 07/29/2019 6:50:57 PM By: Levan Hurst RN, BSN Entered By: Levan Hurst on 07/28/2019 11:30:33 -------------------------------------------------------------------------------- Patient/Caregiver Education Details Patient Name: Date of Service: Franky Macho 10/20/2020andnbsp11:00 AM Medical Record Patient Account Number: [id number] Number: Treating RN: [name] Date of Birth/Gender: 1956/10/07 (63 y.o. F) Other Clinician: Primary Care Physician: Maurice Small Treating Linton Ham Referring Physician: Physician/Extender: Hope Budds in Treatment: 15 Education Assessment Education Provided To: Patient Education Topics Provided Pain: Methods: Explain/Verbal Responses: State content correctly Electronic Signature(s) Signed: 09/15/2019 3:01:15 PM By: [name] RN Entered By: [name] on 07/28/2019 11:10:39 -------------------------------------------------------------------------------- Wound Assessment Details Patient Name: Date of Service: ALFONSO, SHACKETT 07/28/2019 11:00 AM Medical Record [id number] Patient Account Number: [id number] Date of Birth/Sex: Treating RN: April 25, 1956 (63 y.o. Nancy Fetter Primary Care Tylan Briguglio: Maurice Small Other Clinician: Referring Ariz Terrones: Treating Nyeem Stoke/Extender:Robson, Michiel Cowboy, Judson Roch in Treatment: 15 Wound Status Wound Number: 8 Primary Dehisced Wound Etiology: Wound Location: Right Amputation Site - Below Knee - Medial Wound Open Status: Wounding Event: Surgical Injury Comorbid Hypertension, Peripheral Arterial Disease, Date  Acquired: 04/01/2019 History: Type II Diabetes, Neuropathy Weeks Of Treatment: 15 Clustered Wound: No Photos Wound Measurements Length: (cm) 1.7 % Reduc Width: (cm) 3.2 % Reduc Depth: (cm) 0.1 Epithel Area: (cm) 4.273 Tunnel Volume: (cm) 0.427 Underm Wound Description Full Thickness Without Exposed Support Foul O Classification: Structures Slough Wound Flat and Intact Margin: Exudate Medium Amount: Exudate Serosanguineous Type: Exudate red, brown Color: Wound Bed Granulation Amount: Large (67-100%) Granulation Quality: Red, Pink Fascia Necrotic Amount: Small (1-33%) Fat Lay Necrotic Quality: Adherent Slough Tendon Muscle Joint E Bone Ex dor After Cleansing: No /Fibrino Yes Exposed Structure Exposed: No er (Subcutaneous Tissue) Exposed: Yes Exposed: No Exposed: No xposed: No posed: No tion in Area: 57% tion in Volume: 57% ialization: Small (1-33%) ing: No ining: No Electronic Signature(s) Signed: 07/29/2019 6:50:57 PM By: Levan Hurst RN, BSN Signed: 07/30/2019 4:25:07 PM By: Mikeal Hawthorne EMT/HBOT Entered By: Mikeal Hawthorne on 07/29/2019 11:38:56 -------------------------------------------------------------------------------- [place] Details Patient Name: Date of Service: Franky Macho. 07/28/2019 11:00 AM Medical Record [id number] Patient Account Number: [id number] Date of Birth/Sex: Treating RN: 1956/08/01 (63 y.o. Nancy Fetter Primary Care Marquelle Musgrave: Maurice Small Other Clinician: Referring Kyleah Pensabene: Treating Graiden Henes/Extender:Robson, Michiel Cowboy, Judson Roch in Treatment: 15 Vital Signs Time Taken: 11:32 Temperature (F): 98.3 Height (in): 62 Pulse (bpm): 68 Weight (lbs): 190 Respiratory  Rate (breaths/min): 18 Body Mass Index (BMI): 34.7 Blood Pressure (mmHg): 125/57 Capillary Blood Glucose (mg/dl): 517 Reference Range: 80 - 120 mg / dl Notes glucose per pt report Electronic Signature(s) Signed: 07/29/2019 6:50:57 PM By:  Zandra Abts RN, BSN Entered By: Zandra Abts on 07/28/2019 11:33:05

## 2019-09-16 NOTE — Progress Notes (Signed)
Danielle Buchanan, Danielle M. ([id number]) Visit Report for 08/25/2019 Arrival Information Details Patient Name: Date of Service: Danielle Buchanan, Danielle M. 08/25/2019 12:30 PM Medical Record [id number] Patient Account Number: [id number] Date of Birth/Sex: Treating RN: [birth year] (63 y.o. Danielle Buchanan) Lynch, Danielle Buchanan Primary Care Demario Faniel: Danielle MylarWebb, Carol Other Clinician: Referring Levonte Molina: Treating Shatika Grinnell/Extender:Robson, Ivin BootyMichael Webb, Dellia Nimsarol Weeks in Treatment: 19 Visit Information History Since Last Visit Added or deleted any medications: No Patient Arrived: Wheel Chair Any new allergies or adverse reactions: No Arrival Time: 12:58 Had a fall or experienced change in No activities of daily living that may affect Accompanied By: son risk of falls: Transfer Assistance: None Signs or symptoms of abuse/neglect since last No Patient Identification Verified: Yes visito Secondary Verification Process Completed: Yes Hospitalized since last visit: No Patient Requires Transmission-Based No Implantable device outside of the clinic excluding No Precautions: cellular tissue based products placed in the center Patient Has Alerts: No since last visit: Has Dressing in Place as Prescribed: Yes Pain Present Now: No Electronic Signature(s) Signed: 08/31/2019 2:18:43 PM By: Zandra AbtsLynch, Shatara RN, BSN Entered By: Zandra AbtsLynch, Danielle Buchanan on 08/25/2019 13:01:15 -------------------------------------------------------------------------------- Clinic Level of Care Assessment Details Patient Name: Date of Service: Danielle Buchanan, Shazia M. 08/25/2019 12:30 PM Medical Record [id number] Patient Account Number: [id number] Date of Birth/Sex: Treating RN: [birth year] (63 y.o. Danielle Buchanan) Epps, Carrie Primary Care Dealva Lafoy: Danielle MylarWebb, Carol Other Clinician: Referring Inita Uram: Treating Arinze Rivadeneira/Extender:Robson, Ivin BootyMichael Webb, Dellia Nimsarol Weeks in Treatment: 19 Clinic Level of Care Assessment Items TOOL 4 Quantity Score X - Use when only an EandM is performed  on FOLLOW-UP visit 1 0 ASSESSMENTS - Nursing Assessment / Reassessment X - Reassessment of Co-morbidities (includes updates in patient status) 1 10 X - Reassessment of Adherence to Treatment Plan 1 5 ASSESSMENTS - Wound and Skin Assessment / Reassessment X - Simple Wound Assessment / Reassessment - one wound 1 5 []  - Complex Wound Assessment / Reassessment - multiple wounds 0 []  - Dermatologic / Skin Assessment (not related to wound area) 0 ASSESSMENTS - Focused Assessment []  - Circumferential Edema Measurements - multi extremities 0 []  - Nutritional Assessment / Counseling / Intervention 0 []  - Lower Extremity Assessment (monofilament, tuning fork, pulses) 0 []  - Peripheral Arterial Disease Assessment (using hand held doppler) 0 ASSESSMENTS - Ostomy and/or Continence Assessment and Care []  - Incontinence Assessment and Management 0 []  - Ostomy Care Assessment and Management (repouching, etc.) 0 PROCESS - Coordination of Care X - Simple Patient / Family Education for ongoing care 1 15 []  - Complex (extensive) Patient / Family Education for ongoing care 0 X - Staff obtains ChiropractorConsents, Records, Test Results / Process Orders 1 10 []  - Staff telephones HHA, Nursing Homes / Clarify orders / etc 0 []  - Routine Transfer to another Facility (non-emergent condition) 0 []  - Routine Hospital Admission (non-emergent condition) 0 []  - New Admissions / Manufacturing engineernsurance Authorizations / Ordering NPWT, Apligraf, etc. 0 []  - Emergency Hospital Admission (emergent condition) 0 X - Simple Discharge Coordination 1 10 []  - Complex (extensive) Discharge Coordination 0 PROCESS - Special Needs []  - Pediatric / Minor Patient Management 0 []  - Isolation Patient Management 0 []  - Hearing / Language / Visual special needs 0 []  - Assessment of Community assistance (transportation, D/C planning, etc.) 0 []  - Additional assistance / Altered mentation 0 []  - Support Surface(s) Assessment (bed, cushion, seat, etc.)  0 INTERVENTIONS - Wound Cleansing / Measurement X - Simple Wound Cleansing - one wound 1 5 []  - Complex Wound Cleansing - multiple wounds 0 X - Wound  Imaging (photographs - any number of wounds) 1 5 []  - Wound Tracing (instead of photographs) 0 X - Simple Wound Measurement - one wound 1 5 []  - Complex Wound Measurement - multiple wounds 0 INTERVENTIONS - Wound Dressings []  - Small Wound Dressing one or multiple wounds 0 X - Medium Wound Dressing one or multiple wounds 1 15 []  - Large Wound Dressing one or multiple wounds 0 []  - Application of Medications - topical 0 []  - Application of Medications - injection 0 INTERVENTIONS - Miscellaneous []  - External ear exam 0 []  - Specimen Collection (cultures, biopsies, blood, body fluids, etc.) 0 []  - Specimen(s) / Culture(s) sent or taken to Lab for analysis 0 []  - Patient Transfer (multiple staff / / Similar devices) 0 []  - Simple Staple / Suture removal (25 or less) 0 []  - Complex Staple / Suture removal (26 or more) 0 []  - Hypo / Hyperglycemic Management (close monitor of Blood Glucose) 0 []  - Ankle / Brachial Index (ABI) - do not check if billed separately 0 X - Vital Signs 1 5 Has the patient been seen at the hospital within the last three years: Yes Total Score: 90 Level Of Care: New/Established - Level 3 Electronic Signature(s) Signed: 09/15/2019 2:50:47 PM By: RN Entered By: on 08/25/2019 13:57:18 -------------------------------------------------------------------------------- Encounter Discharge Information Details Patient Name: Date of Service: . 08/25/2019 12:30 PM Medical Record Patient Account Number: Date of Birth/Sex: Treating RN: 1956/06/19 (63 y.o. Primary Care Javiel Canepa: Other Clinician: Referring Sven Pinheiro: Treating Arzell Mcgeehan/Extender:Robson, , in Treatment: 19 Encounter Discharge  Information Items Discharge Condition: Stable Ambulatory Status: Wheelchair Discharge Destination: Home Transportation: Private Auto Accompanied By: son Schedule Follow-up Appointment: Yes Clinical Summary of Care: Patient Declined Electronic Signature(s) Signed: 08/27/2019 6:05:31 PM By: Yevonne Pax Entered By: 08/27/2019 on 08/25/2019 14:05:45 -------------------------------------------------------------------------------- Multi Wound Chart Details Patient Name: Date of Service: 08/27/2019. 08/25/2019 12:30 PM Medical Record [id number] Patient Account Number: 02/17/1956 Date of Birth/Sex: Treating RN: [birth year] (63 y.o. Danielle Buchanan Primary Care Matayah Reyburn: Ivin Booty Other Clinician: Referring Adair Lemar: Treating Rigoberto Repass/Extender:Robson, Dellia Nims, 20 in Treatment: 19 Vital Signs Height(in): 62 Capillary Blood 131 Glucose(mg/dl): Weight(lbs): Cherylin Buchanan Pulse(bpm): 73 Body Mass Index(BMI): 35 Blood Pressure(mmHg): 124/54 Temperature(F): 98.5 Respiratory 16 Rate(breaths/min): Photos: [8:No Photos] [N/A:N/A] Wound Location: [8:Right Amputation Site - Below Knee - Medial] [N/A:N/A] Wounding Event: [8:Surgical Injury] [N/A:N/A] Primary Etiology: [8:Dehisced Wound] [N/A:N/A] Comorbid History: [8:Hypertension, Peripheral Arterial Disease, Type II Diabetes, Neuropathy] [N/A:N/A] Date Acquired: [8:04/01/2019] [N/A:N/A] Weeks of Treatment: [8:19] [N/A:N/A] Wound Status: [8:Open] [N/A:N/A] Measurements L x W x D [8:1.2x2.5x0.1] [N/A:N/A] (cm) Area (cm) : [8:2.356] [N/A:N/A] Volume (cm) : [8:0.236] [N/A:N/A] % Reduction in Area: [8:76.30%] [N/A:N/A] % Reduction in Volume: [8:76.30%] [N/A:N/A] Classification: [8:Full Thickness Without Exposed Support Structures] [N/A:N/A] Exudate Amount: [8:Medium] [N/A:N/A] Exudate Type: [8:Serosanguineous] [N/A:N/A] Exudate Color: [8:red, brown] [N/A:N/A] Wound Margin: [8:Flat and Intact]  [N/A:N/A] Granulation Amount: [8:Large (67-100%)] [N/A:N/A] Granulation Quality: [8:Red, Pink] [N/A:N/A] Necrotic Amount: [8:Small (1-33%)] [N/A:N/A] Exposed Structures: [8:Fat Layer (Subcutaneous N/A Tissue) Exposed: Yes Fascia: No Tendon: No Muscle: No Joint: No Bone: No Small (1-33%)] [N/A:N/A] Treatment Notes Electronic Signature(s) Signed: 08/25/2019 6:07:35 PM By: Danielle Contes MD Signed: 09/15/2019 2:50:47 PM By: [id number] RN Entered By: [id number] on 08/25/2019 13:57:16 -------------------------------------------------------------------------------- Multi-Disciplinary Care Plan Details Patient Name: Date of Service: 64. 08/25/2019 12:30 PM Medical Record Danielle Buchanan Patient Account Number: Ivin Booty Date of  Birth/Sex: Treating RN: [birth year] (63 y.o. Orvan Falconer Primary Care Murlin Schrieber: Maurice Small Other Clinician: Referring Kaydon Creedon: Treating Geraldine Tesar/Extender:Robson, Michiel Cowboy, Judson Roch in Treatment: 19 Active Inactive Pain, Acute or Chronic Nursing Diagnoses: Pain, acute or chronic: actual or potential Potential alteration in comfort, pain Goals: Patient will verbalize adequate pain control and receive pain control interventions during procedures as needed Date Initiated: 04/09/2019 Target Resolution Date: 09/11/2019 Goal Status: Active Interventions: Provide education on pain management Reposition patient for comfort Treatment Activities: Administer pain control measures as ordered : 04/09/2019 Notes: Electronic Signature(s) Signed: 09/15/2019 2:50:47 PM By: [name] RN Entered By: [name] on 08/25/2019 13:47:43 -------------------------------------------------------------------------------- Pain Assessment Details Patient Name: Date of Service: Danielle Buchanan, Danielle Buchanan 08/25/2019 12:30 PM Medical Record [id number] Patient Account Number: [id number] Date of Birth/Sex: Treating RN: 01-12-1956 (63 y.o. Danielle Buchanan Primary Care Desmond Szabo: Maurice Small Other Clinician: Referring Delbra Zellars: Treating Reginald Mangels/Extender:Robson, Michiel Cowboy, Judson Roch in Treatment: 19 Active Problems Location of Pain Severity and Description of Pain Patient Has Paino No Site Locations Pain Management and Medication Current Pain Management: Electronic Signature(s) Signed: 08/31/2019 2:18:43 PM By: Levan Hurst RN, BSN Entered By: Levan Hurst on 08/25/2019 13:01:49 -------------------------------------------------------------------------------- Patient/Caregiver Education Details Patient Name: Date of Service: Danielle Buchanan 11/17/2020andnbsp12:30 PM Medical Record Patient Account Number: [id number] Number: Treating RN: [name] Date of Birth/Gender: 12-Jan-1956 (63 y.o. F) Other Clinician: Primary Care Physician: Maurice Small Treating Linton Ham Referring Physician: Physician/Extender: Hope Budds in Treatment: 19 Education Assessment Education Provided To: Patient Education Topics Provided Pain: Methods: Explain/Verbal Responses: State content correctly Electronic Signature(s) Signed: 09/15/2019 2:50:47 PM By: [name] RN Entered By: [name] on 08/25/2019 13:48:14 -------------------------------------------------------------------------------- Wound Assessment Details Patient Name: Date of Service: Danielle Buchanan, Danielle Buchanan 08/25/2019 12:30 PM Medical Record [id number] Patient Account Number: [id number] Date of Birth/Sex: Treating RN: [birth year] (64 y.o. Danielle Buchanan Primary Care Maribeth Jiles: Maurice Small Other Clinician: Referring Denice Cardon: Treating Rosalita Carey/Extender:Robson, Michiel Cowboy, Judson Roch in Treatment: 19 Wound Status Wound Number: [id number] Primary Dehisced Wound Etiology: Wound Location: Right Amputation Site - Below Knee - Medial Wound Open Status: Wounding Event: Surgical Injury Comorbid Hypertension, Peripheral Arterial Disease, Date  Acquired: 04/01/2019 History: Type II Diabetes, Neuropathy Weeks Of Treatment: 19 Clustered Wound: No Photos Wound Measurements Length: (cm) 1.2 % Reduc Width: (cm) 2.5 % Reduc Depth: (cm) 0.1 Epithel Area: (cm) 2.356 Tunnel Volume: (cm) 0.236 Underm Wound Description Full Thickness Without Exposed Support Foul O Classification: Structures Slough Wound Flat and Intact Margin: Exudate Medium Amount: Exudate Serosanguineous Type: Exudate red, brown Color: Wound Bed Granulation Amount: Large (67-100%) Granulation Quality: Red, Pink Fascia Necrotic Amount: Small (1-33%) Fat Lay Necrotic Quality: Adherent Slough Tendon Muscle Joint E Bone Ex dor After Cleansing: No /Fibrino Yes Exposed Structure Exposed: No er (Subcutaneous Tissue) Exposed: Yes Exposed: No Exposed: No xposed: No posed: No tion in Area: 76.3% tion in Volume: 76.3% ialization: Small (1-33%) ing: No ining: No Electronic Signature(s) Signed: 09/07/2019 4:19:43 PM By: Mikeal Hawthorne EMT/HBOT Signed: 09/16/2019 12:09:06 PM By: Levan Hurst RN, BSN Previous Signature: 08/31/2019 2:18:43 PM Version By: Levan Hurst RN, BSN Entered By: Mikeal Hawthorne on 09/07/2019 11:09:28 -------------------------------------------------------------------------------- [place] Details Patient Name: Date of Service: Danielle Buchanan. 08/25/2019 12:30 PM Medical Record [id number] Patient Account Number: [id number] Date of Birth/Sex: Treating RN: Apr 11, 1956 (63 y.o. Danielle Buchanan Primary Care Wynette Buchanan: Maurice Small Other Clinician: Referring Raheem Kolbe: Treating Nehal Shives/Extender:Robson, Michiel Cowboy, Judson Roch in Treatment: 19 Vital Signs Time Taken: 13:00 Temperature (F): 98.5 Height (  in): 62 Pulse (bpm): 73 Weight (lbs): 190 Respiratory Rate (breaths/min): 16 Body Mass Index (BMI): 34.7 Blood Pressure (mmHg): 124/54 Capillary Blood Glucose (mg/dl): 098 Reference Range: 80 - 120 mg /  dl Notes glucose per pt report Electronic Signature(s) Signed: 08/31/2019 2:18:43 PM By: Zandra Abts RN, BSN Entered By: Zandra Abts on 08/25/2019 13:01:43

## 2019-09-16 NOTE — Progress Notes (Signed)
Danielle Buchanan, Danielle M. ([id number]) Visit Report for 08/11/2019 Arrival Information Details Patient Name: Date of Service: Danielle Buchanan, Natoya M. 08/11/2019 12:30 PM Medical Record [id number] Patient Account Number: [id number] Date of Birth/Sex: Treating RN: [birth year] (63 y.o. [name]) Epps, Carrie Primary Care Shakyra Mattera: Shirlean MylarWebb, Carol Other Clinician: Referring Stefano Trulson: Treating Darel Ricketts/Extender:Robson, Ivin BootyMichael Webb, Dellia Nimsarol Weeks in Treatment: 17 Visit Information History Since Last Visit Added or deleted any medications: No Patient Arrived: Wheel Chair Any new allergies or adverse reactions: No Arrival Time: 13:30 Had a fall or experienced change in No activities of daily living that may affect Accompanied By: self risk of falls: Transfer Assistance: None Signs or symptoms of abuse/neglect since last No Patient Identification Verified: Yes visito Secondary Verification Process Completed: Yes Hospitalized since last visit: No Patient Requires Transmission-Based No Implantable device outside of the clinic excluding No Precautions: cellular tissue based products placed in the center Patient Has Alerts: No since last visit: Has Dressing in Place as Prescribed: Yes Pain Present Now: No Electronic Signature(s) Signed: 08/13/2019 5:16:49 PM By: Cherylin Mylarwiggins, Shannon Entered By: Cherylin Mylarwiggins, Shannon on 08/11/2019 13:32:10 -------------------------------------------------------------------------------- Lower Extremity Assessment Details Patient Name: Date of Service: Danielle Buchanan, Danielle M. 08/11/2019 12:30 PM Medical Record [id number] Patient Account Number: [id number] Date of Birth/Sex: Treating RN: [birth year] (63 y.o. Harvest DarkF) Dwiggins, Shannon Primary Care Umaima Scholten: Shirlean MylarWebb, Carol Other Clinician: Referring Karlei Waldo: Treating Kaylen Motl/Extender:Robson, Ivin BootyMichael Webb, Dellia Nimsarol Weeks in Treatment: 17 Electronic Signature(s) Signed: 08/13/2019 5:16:49 PM By: Cherylin Mylarwiggins, Shannon Entered By: Cherylin Mylarwiggins, Shannon on  08/11/2019 13:34:07 -------------------------------------------------------------------------------- Multi Wound Chart Details Patient Name: Date of Service: Danielle Buchanan, Danielle M. 08/11/2019 12:30 PM Medical Record [id number] Patient Account Number: [id number] Date of Birth/Sex: Treating RN: [birth year] (63 y.o. [name]) Epps, Carrie Primary Care Jayne Peckenpaugh: Shirlean MylarWebb, Carol Other Clinician: Referring Aniston Christman: Treating Alyah Boehning/Extender:Robson, Ivin BootyMichael Webb, Dellia Nimsarol Weeks in Treatment: 17 Vital Signs Height(in): 62 Capillary Blood 91 Glucose(mg/dl): Weight(lbs): 536190 Pulse(bpm): 72 Body Mass Index(BMI): 35 Blood Pressure(mmHg): 130/57 Temperature(F): 98.2 Respiratory 18 Rate(breaths/min): Photos: [8:No Photos] [N/A:N/A] Wound Location: [8:Right Amputation Site - Below Knee - Medial] [N/A:N/A] Wounding Event: [8:Surgical Injury] [N/A:N/A] Primary Etiology: [8:Dehisced Wound] [N/A:N/A] Comorbid History: [8:Hypertension, Peripheral N/A Arterial Disease, Type II Diabetes, Neuropathy] Date Acquired: [8:04/01/2019] [N/A:N/A] Weeks of Treatment: [8:17] [N/A:N/A] Wound Status: [8:Open] [N/A:N/A] Measurements L x W x D 1x2.5x0.1 [N/A:N/A] (cm) Area (cm) : [8:1.963] [N/A:N/A] Volume (cm) : [8:0.196] [N/A:N/A] % Reduction in Area: [8:80.20%] [N/A:N/A] % Reduction in Volume: 80.30% [N/A:N/A] Classification: [8:Full Thickness Without Exposed Support Structures] [N/A:N/A] Exudate Amount: [8:Medium] [N/A:N/A] Exudate Type: [8:Serosanguineous] [N/A:N/A] Exudate Color: [8:red, brown] [N/A:N/A] Wound Margin: [8:Flat and Intact] [N/A:N/A] Granulation Amount: [8:Large (67-100%)] [N/A:N/A] Granulation Quality: [8:Red, Pink] [N/A:N/A] Necrotic Amount: [8:Small (1-33%)] [N/A:N/A] Exposed Structures: [8:Fat Layer (Subcutaneous N/A Tissue) Exposed: Yes Fascia: No Tendon: No Muscle: No Joint: No Bone: No] Epithelialization: [8:Small (1-33%)] [N/A:N/A] Debridement: [8:Debridement - Excisional]  [N/A:N/A] Pre-procedure [8:13:50] [N/A:N/A] Verification/Time Out Taken: Pain Control: [8:Lidocaine 5% topical ointment] [N/A:N/A] Tissue Debrided: [8:Subcutaneous, Slough] [N/A:N/A] Level: [8:Skin/Subcutaneous Tissue] [N/A:N/A] Debridement Area (sq cm):2.5 [N/A:N/A] Instrument: [8:Curette] [N/A:N/A] Bleeding: [8:Moderate] [N/A:N/A] Hemostasis Achieved: [8:Pressure] [N/A:N/A] Procedural Pain: [8:0] [N/A:N/A] Post Procedural Pain: [8:0] [N/A:N/A] Debridement Treatment Procedure was tolerated [N/A:N/A] Response: [8:well] Post Debridement [8:1x2.5x0.1] [N/A:N/A] Measurements L x W x D (cm) Post Debridement [8:0.196] [N/A:N/A] Volume: (cm) Procedures Performed: Debridement [N/A:N/A] Treatment Notes Electronic Signature(s) Signed: 08/11/2019 6:03:12 PM By: Baltazar Najjarobson, Michael MD Signed: 09/16/2019 12:05:18 PM By: Yevonne PaxEpps, Carrie RN Entered By: Baltazar Najjarobson, Michael on 08/11/2019 14:09:55 -------------------------------------------------------------------------------- Multi-Disciplinary Care Plan Details Patient Name: Date of Service: Danielle Buchanan, Danielle M. 08/11/2019 12:30 PM  Medical Record [id number] Patient Account Number: [id number] Date of Birth/Sex: Treating RN: 04-30-1956 (63 y.o. Orvan Falconer Primary Care Sachiko Methot: Maurice Small Other Clinician: Referring Douglas Smolinsky: Treating Davidson Palmieri/Extender:Robson, Michiel Cowboy, Judson Roch in Treatment: 17 Active Inactive Pain, Acute or Chronic Nursing Diagnoses: Pain, acute or chronic: actual or potential Potential alteration in comfort, pain Goals: Patient will verbalize adequate pain control and receive pain control interventions during procedures as needed Date Initiated: 04/09/2019 Target Resolution Date: 08/14/2019 Goal Status: Active Interventions: Provide education on pain management Reposition patient for comfort Treatment Activities: Administer pain control measures as ordered : 04/09/2019 Notes: Electronic Signature(s) Signed:  09/16/2019 12:05:18 PM By: [name] RN Entered By: [name] on 08/11/2019 13:29:55 -------------------------------------------------------------------------------- Pain Assessment Details Patient Name: Date of Service: Franky Macho. 08/11/2019 12:30 PM Medical Record [id number] Patient Account Number: [id number] Date of Birth/Sex: Treating RN: 08-04-1956 (63 y.o. Clearnce Sorrel Primary Care Anjel Perfetti: Maurice Small Other Clinician: Referring Nechelle Petrizzo: Treating Jabe Jeanbaptiste/Extender:Robson, Michiel Cowboy, Judson Roch in Treatment: 17 Active Problems Location of Pain Severity and Description of Pain Patient Has Paino No Site Locations Pain Management and Medication Current Pain Management: Electronic Signature(s) Signed: 08/13/2019 5:16:49 PM By: Kela Millin Entered By: Kela Millin on 08/11/2019 13:33:58 -------------------------------------------------------------------------------- Patient/Caregiver Education Details Patient Name: Date of Service: Franky Macho 11/3/2020andnbsp12:30 PM Medical Record [id number] Patient Account Number: [id number] Date of Birth/Gender: Treating RN: 1956/05/12 (63 y.o. Orvan Falconer Primary Care Physician: Maurice Small Other Clinician: Referring Physician: Treating Physician/Extender:Robson, Michiel Cowboy, Judson Roch in Treatment: 17 Education Assessment Education Provided To: Patient Education Topics Provided Pain: Methods: Explain/Verbal Responses: State content correctly Electronic Signature(s) Signed: 09/16/2019 12:05:18 PM By: [name] RN Entered By: [name] on 08/11/2019 13:47:32 -------------------------------------------------------------------------------- Wound Assessment Details Patient Name: Date of Service: YOLANI, VO 08/11/2019 12:30 PM Medical Record [id number] Patient Account Number: [id number] Date of Birth/Sex: Treating RN: 08-29-1956 (63 y.o. Clearnce Sorrel Primary Care Melia Hopes: Maurice Small Other Clinician: Referring El Pile: Treating Kess Mcilwain/Extender:Robson, Michiel Cowboy, Judson Roch in Treatment: 17 Wound Status Wound Number: [id number] Primary Dehisced Wound Etiology: Wound Location: Right Amputation Site - Below Knee - Medial Wound Open Status: Wounding Event: Surgical Injury Comorbid Hypertension, Peripheral Arterial Disease, Date Acquired: 04/01/2019 History: Type II Diabetes, Neuropathy Weeks Of Treatment: 17 Clustered Wound: No Photos Wound Measurements Length: (cm) 1 % Reduct Width: (cm) 2.5 % Reduct Depth: (cm) 0.1 Epitheli Area: (cm) 1.963 Tunneli Volume: (cm) 0.196 Undermi Wound Description Full Thickness Without Exposed Support Foul Od Classification: Structures Slough/ Wound Flat and Intact Margin: Exudate Medium Amount: Exudate Serosanguineous Type: Exudate red, brown Color: Wound Bed Granulation Amount: Large (67-100%) Granulation Quality: Red, Pink Fascia Necrotic Amount: Small (1-33%) Fat Lay Necrotic Quality: Adherent Slough Tendon Muscle Joint E Bone Ex or After Cleansing: No Fibrino Yes Exposed Structure Exposed: No er (Subcutaneous Tissue) Exposed: Yes Exposed: No Exposed: No xposed: No posed: No ion in Area: 80.2% ion in Volume: 80.3% alization: Small (1-33%) ng: No ning: No Electronic Signature(s) Signed: 08/14/2019 6:04:56 PM By: Kela Millin Signed: 08/17/2019 4:06:15 PM By: Mikeal Hawthorne EMT/HBOT Previous Signature: 08/13/2019 5:16:49 PM Version By: Kela Millin Entered By: Mikeal Hawthorne on 08/14/2019 11:09:14 -------------------------------------------------------------------------------- Vitals Details Patient Name: Date of Service: Franky Macho. 08/11/2019 12:30 PM Medical Record [id number] Patient Account Number: [id number] Date of Birth/Sex: Treating RN: 05/21/1956 (63 y.o. Clearnce Sorrel Primary Care Jermany Sundell: Maurice Small Other  Clinician: Referring Elianah Karis: Treating Charolett Yarrow/Extender:Robson, Michiel Cowboy, Judson Roch in Treatment: 17 Vital Signs Time Taken: 13:30 Temperature (F):  98.2 Height (in): 62 Pulse (bpm): 72 Weight (lbs): 190 Respiratory Rate (breaths/min): 18 Body Mass Index (BMI): 34.7 Blood Pressure (mmHg): 130/57 Capillary Blood Glucose (mg/dl): 91 Reference Range: 80 - 120 mg / dl Notes patient reported last blood sugar was 91 Electronic Signature(s) Signed: 08/13/2019 5:16:49 PM By: Cherylin Mylar Entered By: Cherylin Mylar on 08/11/2019 13:33:51

## 2019-09-16 NOTE — Progress Notes (Signed)
Danielle Buchanan, Danielle Buchanan ([id number]) Visit Report for 08/11/2019 Debridement Details Patient Name: Date of Service: Danielle Buchanan, Danielle Buchanan 08/11/2019 12:30 PM Medical Record [id number] Patient Account Number: [id number] Date of Birth/Sex: Treating RN: [birth year] (63 y.o. [name] Primary Care Provider: [name] Other Clinician: Referring Provider: Treating Provider/Extender:Temple Sporer, Ivin Booty, Dellia Nims in Treatment: 17 Debridement Performed for Wound #8 Right,Medial Amputation Site - Below Knee Assessment: Performed By: Physician Maxwell Caul., MD Debridement Type: Debridement Level of Consciousness (Pre- Awake and Alert procedure): Pre-procedure Yes - 13:50 Verification/Time Out Taken: Start Time: 13:50 Pain Control: Lidocaine 5% topical ointment Total Area Debrided (L x W): 1 (cm) x 2.5 (cm) = 2.5 (cm) Tissue and other material Viable, Non-Viable, Slough, Subcutaneous, Slough debrided: Level: Skin/Subcutaneous Tissue Debridement Description: Excisional Instrument: Curette Bleeding: Moderate Hemostasis Achieved: Pressure End Time: 13:54 Procedural Pain: 0 Post Procedural Pain: 0 Response to Treatment: Procedure was tolerated well Level of Consciousness Awake and Alert (Post-procedure): Post Debridement Measurements of Total Wound Length: (cm) 1 Width: (cm) 2.5 Depth: (cm) 0.1 Volume: (cm) 0.196 Character of Wound/Ulcer Post Improved Debridement: Post Procedure Diagnosis Same as Pre-procedure Electronic Signature(s) Signed: 08/11/2019 6:03:12 PM By: Baltazar Najjar MD Signed: 09/16/2019 12:05:18 PM By: Yevonne Pax RN Entered By: Baltazar Najjar on 08/11/2019 14:10:04 -------------------------------------------------------------------------------- HPI Details Patient Name: Date of Service: Danielle Contes. 08/11/2019 12:30 PM Medical Record [id number] Patient Account Number: [id number] Date of Birth/Sex: Treating RN: 15-Jun-1956 (63 y.o. [name] Primary Care Provider: [name] Other Clinician: Referring Provider: Treating Provider/Extender:Shashana Fullington, Ivin Booty, Dellia Nims in Treatment: 17 History of Present Illness Location: right foot Quality: Patient reports experiencing a dull pain to affected area(s). Severity: Patient states wound(s) are getting worse. Duration: Patient has had the wound for > 3 months prior to seeking treatment at the wound center Timing: Pain in wound is constant (hurts all the time) Context: the original wound on the right foot is the same and she's recently had a left below-knee amputation Modifying Factors: Patient wound(s)/ulcer(s) are worsening due to :significant pain at rest HPI Description: 63 year old diabetic patient with a past medical history of essential hypertension, hyper lipidemia, PTSD, was seen recently and a walk-in clinic a week ago for pain and ulcers on the feet which she's had for 2 months. The diagnosis of foot cellulitis was made and the patient was put on Bactrim DS for 14 days along with Neurontin and pain medication and asked to see the wound center. The patient has been seen by the endocrinologist [name] and noted that she has had diabetes since 1992 and has polyneuropathy and peripheral arterial disease.She is a smoker and smokes about a pack of cigarettes a day. She was recently referred to a dermatologist. Most recently she had a lower arterial duplex study done which showed the patient had no evidence of significant right or left lower extremity arterial disease based on the ABI which was 1.09 on the right and 1.01 on the left with triphasic flow except the left anterior tibial which suggest arterial occlusive disease. The bilateral toe brachial indices were abnormal with the right being 0.23 and the left being 0.24. the patient also had an arterial duplex examination which showed plaque bilaterally in the common femoral artery and the proximal  superficial femoral artery and also in the popliteal artery and the right anterior tibial artery and the left posterior tibial artery. These tests were requested by her primary care physician Dr. [name], but the patient and her caregiver  who was her nephew say they did not receive any call back regarding the test nor have they been told to go to a vascular surgeon. 06/19/2017 -- was seen by [name] who has scheduled her for a aortogram and runoff on 06/24/2017-- he suspects this is going to be microvascular disease in addition to her diabetic ulcers. The benefit from an angiogram may be just as a baseline study to see if there is any pedal work that can be done to help improve the blood flow. He also discussed that she may lose her toes or have more proximal amputation of her limbs. She was recently admitted to the hospital between 06/09/2017 and 06/11/2017 with uncontrolled diabetes mellitus type 2, hypertension, nicotine dependence, worsening leg leg pain. She was treated appropriately for cellulitis she was sent home on Clindamycin 300 mg 3 times a day. Left foot x-ray done during this admission showed no radiographic evidence of osteomyelitis. Last hemoglobin A1c was 11.6% She has given up smoking for the last week. 06/26/2017 -- she was seen by [name] recently and an angiogram was done and found that she does not have any options for reconstruction but does have flow bilaterally to the ankles. He was hopeful that with wound care and smoking sensation she could heal her wounds. Procedure performed on 06/24/2017 was a aortogram with bilateral lower extremity runoff. The findings were that of a patent aortobiiliac segments with a 30% stenosis of the left SFA and otherwise patent runoff to the popliteal arteries bilaterally. Predominant runoff on the right is via the peroneal artery with not much flow to the right foot. On the left side dominant runoff is via the posterior tibial  artery and there is also peroneal artery. There is no appreciable plantar arch on the left either. No intervention was undertaken. 07/17/2017 -- the initial paperwork to [facility] has denied her treatment for hyperbaric oxygen therapy. We will re- file the request. In the meanwhile she is scheduled to see [name] on Friday for further discussions regarding amputation of her left forefoot. she was in the ER on 07/12/2017 and I have reviewed these notes including the workup and the treatment plan and I agree with this. 08/21/2017 -- the patient was in and out of the hospital after a revision surgery on 08/12/2017 where she underwent a revision left below-knee amputation after she had a fall and distracted the previous amputation site. Her first BKA on that side was done on 07/23/2017. Further amount of necrotic tissue and bone was resected and primary closure was performed. she was discharged home on 08/15/2017 the patient is been using Santyl ointment on the right foot and the left below-knee amputation site is under the care of her surgeon [name]. She says her blood glucose control has been good and she still not smoking READMISSION 04/09/2019 This is a now 63 year old woman who we cared for in this clinic in 2018 predominantly with a left foot wound. She ultimately had a left BKA and a left U KA. More recently she developed wounds on her feet on the right. She underwent a right BKA on 01/19/2019 for gangrene of the right foot. Her nephew is present tells me that the staples were removed about 2 weeks after that and she had Steri-Strips placed. They noted some drainage under the Steri- Strips and some black discoloration. This reopened with a fairly large wound on the medial aspect of the stump incision and a smaller eschar laterally. Looking through  the notes there may have been a stump infection in April as well. They have been using wet-to-dry dressings to this area twice daily. They  are referred here by vein and vascular Dr. Darrick Penna for our review. Past medical history includes type 2 diabetes with a history of polyneuropathy and PAD. Last hemoglobin A1c at 8.5. Hypertension hyperlipidemia, coronary artery disease, continued tobacco abuse at 1/2 pack/day and some degree of memory decline is listed on her problem list Not possible to do ABIs of course however it is listed that it 1.2 years ago the angiogram suggest that there was enough blood flow down to the level of the ankle. She has had a angioplasty of her right posterior tibial artery and the right tibioperoneal trunk in February 2020 and then a right transmetatarsal amputation in February 2020 before the below-knee amputation. I do not see specifically the angiogram which I will need to look up at a later time. Social history; patient lives alone although she has help from family for her dressings. She has a continued half a pack per day smoker 7/16; difficult wound on the right BKA stump. There is a large wound anteriorly and laterally a small satellite area. We have been using Santyl. She tells me she is got her cigarette smoking down to 4 cigarettes a day 7/23; difficult wounds on the right BKA stump. There is a large wound anteriorly and medially and a smaller area laterally. We have been using Santyl. Family is changing this daily. Still smoking 4 cigarettes a day. 7/30; no real improvement in the area on the right BKA stump. There is a large wound anterior and medially and a much smaller wound laterally. Both of these have necrotic surface is certainly not viable over the large wound. Been using Santyl for several weeks without real improvement. Changed to Iodoflex today 8/13-Is back at 2 weeks, the wound of the right BKA stump looks about the same perhaps marginally better, with some granulation tissue formation, we are using Iodoflex. The much smaller lateral wound is healed 9/3; the right BKA stump wound is  slightly smaller. Surface quite a bit better. She has palpable popliteal pulses. We have been using Iodoflex. Her nephew to helps her with the dressings and washing of the wound I think they have made some progress 9/22; the right BKA stump is slightly smaller. Probably 80% of this as healthy granulation now. Debrided with Anasept and gauze no mechanical debridement. We have been ordering Iodoflex but apparently getting [facility] alg[facility] from [facility] who apparently do not have the Iodoflex 10/6; right BKA stump 2-week follow-up. We have been using Iodoflex but [facility] is providing an iodine-based polymer foam dressing called Iodoplex. These may not be comparable. They got a very large supply of this at home. 10/20; right BKA stump 2-week follow-up. Using Iodoplex at home. Changing this every second day. The wound is making nice progress measures smaller today 11/3; right BKA stump. 2-week follow-up. Using Iodoflex at home. For the first time recently a completely nonviable surface which was difficult to explain. No evidence of infection. Required debridement in spite of this reasonably bad news the surface area of the wound is better Electronic Signature(s) Signed: 08/11/2019 6:03:12 PM By: Baltazar Najjar MD Entered By: Baltazar Najjar on 08/11/2019 14:10:47 -------------------------------------------------------------------------------- Physical Exam Details Patient Name: Date of Service: Danielle Contes. 08/11/2019 12:30 PM Medical Record [id number] Patient Account Number: [id number] Date of Birth/Sex: Treating RN: Nov 20, 1955 (63 y.o. F) Epps, Carrie Primary  Care Provider: Maurice Small Other Clinician: Referring Provider: Treating Provider/Extender:Koraima Albertsen, Michiel Cowboy, Judson Roch in Treatment: 17 Constitutional Sitting or standing Blood Pressure is within target range for patient.. Pulse regular and within target range for patient.Marland Kitchen Respirations regular,  non-labored and within target range.. Temperature is normal and within the target range for the patient.Marland Kitchen Appears in no distress. Notes Wound exam; right BKA stump. Required debridement today with a #5 curette. Very adherent gritty material. No evidence of surrounding infection Electronic Signature(s) Signed: 08/11/2019 6:03:12 PM By: Linton Ham MD Entered By: Linton Ham on 08/11/2019 14:11:45 -------------------------------------------------------------------------------- Physician Orders Details Patient Name: Date of Service: Danielle Macho. 08/11/2019 12:30 PM Medical Record [id number] Patient Account Number: [id number] Date of Birth/Sex: Treating RN: 08/23/1956 (63 y.o. Orvan Falconer Primary Care Provider: Maurice Small Other Clinician: Referring Provider: Treating Provider/Extender:Ivis Henneman, Michiel Cowboy, Judson Roch in Treatment: 17 Verbal / Phone Orders: No Diagnosis Coding ICD-10 Coding Code Description E11.51 Type 2 diabetes mellitus with diabetic peripheral angiopathy without gangrene E11.622 Type 2 diabetes mellitus with other skin ulcer L97.818 Non-pressure chronic ulcer of other part of right lower leg with other specified severity Z89.511 Acquired absence of right leg below knee Z89.612 Acquired absence of left leg above knee Follow-up Appointments Return Appointment in 2 weeks. Dressing Change Frequency Change Dressing every other day. - home health to change twice a week. Wound Cleansing May shower and wash wound with soap and water. Primary Wound Dressing Wound #8 Right,Medial Amputation Site - Below Knee Other: - iodoflex in clinic, home health to apply Ioplex Secondary Dressing Wound #8 Right,Medial Amputation Site - Below Knee Kerlix/Rolled Gauze ABD pad Edema Control Elevate legs to the level of the heart or above for 30 minutes daily and/or when sitting, a frequency of: - 3-4 a day to aid in swelling. Additional Orders /  Instructions Stop/Decrease Smoking Follow Nutritious Diet - increase protein and vegetables within diabetic diet. [facility] for wound care. - [facility]. Electronic Signature(s) Signed: 08/11/2019 6:03:12 PM By: Linton Ham MD Signed: 09/16/2019 12:05:18 PM By: [name] RN Entered By: [name] on 08/11/2019 13:55:47 -------------------------------------------------------------------------------- Problem List Details Patient Name: Date of Service: Danielle Macho. 08/11/2019 12:30 PM Medical Record [id number] Patient Account Number: [id number] Date of Birth/Sex: Treating RN: July 29, 1956 (63 y.o. Orvan Falconer Primary Care Provider: Maurice Small Other Clinician: Referring Provider: Treating Provider/Extender:Jasean Ambrosia, Michiel Cowboy, Judson Roch in Treatment: 17 Active Problems ICD-10 Evaluated Encounter Code Description Active Date Today Diagnosis E11.51 Type 2 diabetes mellitus with diabetic peripheral 04/09/2019 No Yes angiopathy without gangrene E11.622 Type 2 diabetes mellitus with other skin ulcer 04/09/2019 No Yes L97.818 Non-pressure chronic ulcer of other part of right lower 04/09/2019 No Yes leg with other specified severity Z89.511 Acquired absence of right leg below knee 04/09/2019 No Yes Z89.612 Acquired absence of left leg above knee 04/09/2019 No Yes Inactive Problems Resolved Problems Electronic Signature(s) Signed: 08/11/2019 6:03:12 PM By: Linton Ham MD Entered By: Linton Ham on 08/11/2019 14:09:42 -------------------------------------------------------------------------------- Progress Note Details Patient Name: Date of Service: Danielle Macho. 08/11/2019 12:30 PM Medical Record [id number] Patient Account Number: [id number] Date of Birth/Sex: Treating RN: 1955/12/26 (63 y.o. Orvan Falconer Primary Care Provider: Maurice Small Other Clinician: Referring Provider: Treating  Provider/Extender:Adisen Bennion, Michiel Cowboy, Judson Roch in Treatment: 17 Subjective History of Present Illness (HPI) The following HPI elements were documented for the patient's wound: Location: right foot Quality: Patient reports experiencing a dull pain to affected area(s). Severity: Patient  states wound(s) are getting worse. Duration: Patient has had the wound for > 3 months prior to seeking treatment at the wound center Timing: Pain in wound is constant (hurts all the time) Context: the original wound on the right foot is the same and she's recently had a left below-knee amputation Modifying Factors: Patient wound(s)/ulcer(s) are worsening due to :significant pain at rest 63 year old diabetic patient with a past medical history of essential hypertension, hyper lipidemia, PTSD, was seen recently and a walk-in clinic a week ago for pain and ulcers on the feet which she's had for 2 months. The diagnosis of foot cellulitis was made and the patient was put on Bactrim DS for 14 days along with Neurontin and pain medication and asked to see the wound center. The patient has been seen by the endocrinologist [name] and noted that she has had diabetes since 1992 and has polyneuropathy and peripheral arterial disease.She is a smoker and smokes about a pack of cigarettes a day. She was recently referred to a dermatologist. Most recently she had a lower arterial duplex study done which showed the patient had no evidence of significant right or left lower extremity arterial disease based on the ABI which was 1.09 on the right and 1.01 on the left with triphasic flow except the left anterior tibial which suggest arterial occlusive disease. The bilateral toe brachial indices were abnormal with the right being 0.23 and the left being 0.24. the patient also had an arterial duplex examination which showed plaque bilaterally in the common femoral artery and the proximal superficial femoral artery and  also in the popliteal artery and the right anterior tibial artery and the left posterior tibial artery. These tests were requested by her primary care physician Dr. [name], but the patient and her caregiver who was her nephew say they did not receive any call back regarding the test nor have they been told to go to a vascular surgeon. 06/19/2017 -- was seen by [name] who has scheduled her for a aortogram and runoff on 06/24/2017-- he suspects this is going to be microvascular disease in addition to her diabetic ulcers. The benefit from an angiogram may be just as a baseline study to see if there is any pedal work that can be done to help improve the blood flow. He also discussed that she may lose her toes or have more proximal amputation of her limbs. She was recently admitted to the hospital between 06/09/2017 and 06/11/2017 with uncontrolled diabetes mellitus type 2, hypertension, nicotine dependence, worsening leg leg pain. She was treated appropriately for cellulitis she was sent home on Clindamycin 300 mg 3 times a day. Left foot x-ray done during this admission showed no radiographic evidence of osteomyelitis. Last hemoglobin A1c was 11.6% She has given up smoking for the last week. 06/26/2017 -- she was seen by [name] recently and an angiogram was done and found that she does not have any options for reconstruction but does have flow bilaterally to the ankles. He was hopeful that with wound care and smoking sensation she could heal her wounds. Procedure performed on 06/24/2017 was a aortogram with bilateral lower extremity runoff. The findings were that of a patent aortobiiliac segments with a 30% stenosis of the left SFA and otherwise patent runoff to the popliteal arteries bilaterally. Predominant runoff on the right is via the peroneal artery with not much flow to the right foot. On the left side dominant runoff is via the posterior tibial  artery and there is also  peroneal artery. There is no appreciable plantar arch on the left either. No intervention was undertaken. 07/17/2017 -- the initial paperwork to [facility] has denied her treatment for hyperbaric oxygen therapy. We will re- file the request. In the meanwhile she is scheduled to see [name] on Friday for further discussions regarding amputation of her left forefoot. she was in the ER on 07/12/2017 and I have reviewed these notes including the workup and the treatment plan and I agree with this. 08/21/2017 -- the patient was in and out of the hospital after a revision surgery on 08/12/2017 where she underwent a revision left below-knee amputation after she had a fall and distracted the previous amputation site. Her first BKA on that side was done on 07/23/2017. Further amount of necrotic tissue and bone was resected and primary closure was performed. she was discharged home on 08/15/2017 the patient is been using Santyl ointment on the right foot and the left below-knee amputation site is under the care of her surgeon [name]. She says her blood glucose control has been good and she still not smoking READMISSION 04/09/2019 This is a now 63 year old woman who we cared for in this clinic in 2018 predominantly with a left foot wound. She ultimately had a left BKA and a left U KA. More recently she developed wounds on her feet on the right. She underwent a right BKA on 01/19/2019 for gangrene of the right foot. Her nephew is present tells me that the staples were removed about 2 weeks after that and she had Steri-Strips placed. They noted some drainage under the Steri- Strips and some black discoloration. This reopened with a fairly large wound on the medial aspect of the stump incision and a smaller eschar laterally. Looking through the notes there may have been a stump infection in April as well. They have been using wet-to-dry dressings to this area twice daily. They are referred here by vein  and vascular Dr. Darrick Penna for our review. Past medical history includes type 2 diabetes with a history of polyneuropathy and PAD. Last hemoglobin A1c at 8.5. Hypertension hyperlipidemia, coronary artery disease, continued tobacco abuse at 1/2 pack/day and some degree of memory decline is listed on her problem list Not possible to do ABIs of course however it is listed that it 1.2 years ago the angiogram suggest that there was enough blood flow down to the level of the ankle. She has had a angioplasty of her right posterior tibial artery and the right tibioperoneal trunk in February 2020 and then a right transmetatarsal amputation in February 2020 before the below-knee amputation. I do not see specifically the angiogram which I will need to look up at a later time. Social history; patient lives alone although she has help from family for her dressings. She has a continued half a pack per day smoker 7/16; difficult wound on the right BKA stump. There is a large wound anteriorly and laterally a small satellite area. We have been using Santyl. She tells me she is got her cigarette smoking down to 4 cigarettes a day 7/23; difficult wounds on the right BKA stump. There is a large wound anteriorly and medially and a smaller area laterally. We have been using Santyl. Family is changing this daily. Still smoking 4 cigarettes a day. 7/30; no real improvement in the area on the right BKA stump. There is a large wound anterior and medially and a much smaller wound laterally. Both of  these have necrotic surface is certainly not viable over the large wound. Been using Santyl for several weeks without real improvement. Changed to Iodoflex today 8/13-Is back at 2 weeks, the wound of the right BKA stump looks about the same perhaps marginally better, with some granulation tissue formation, we are using Iodoflex. The much smaller lateral wound is healed 9/3; the right BKA stump wound is slightly smaller. Surface  quite a bit better. She has palpable popliteal pulses. We have been using Iodoflex. Her nephew to helps her with the dressings and washing of the wound I think they have made some progress 9/22; the right BKA stump is slightly smaller. Probably 80% of this as healthy granulation now. Debrided with Anasept and gauze no mechanical debridement. We have been ordering Iodoflex but apparently getting [facility] alg[facility] who apparently do not have the Iodoflex 10/6; right BKA stump 2-week follow-up. We have been using Iodoflex but [facility] is providing an iodine-based polymer foam dressing called Iodoplex. These may not be comparable. They got a very large supply of this at home. 10/20; right BKA stump 2-week follow-up. Using Iodoplex at home. Changing this every second day. The wound is making nice progress measures smaller today 11/3; right BKA stump. 2-week follow-up. Using Iodoflex at home. For the first time recently a completely nonviable surface which was difficult to explain. No evidence of infection. Required debridement in spite of this reasonably bad news the surface area of the wound is better Objective Constitutional Sitting or standing Blood Pressure is within target range for patient.. Pulse regular and within target range for patient.Marland Kitchen. Respirations regular, non-labored and within target range.. Temperature is normal and within the target range for the patient.Marland Kitchen. Appears in no distress. Vitals Time Taken: 1:30 PM, Height: 62 in, Weight: 190 lbs, BMI: 34.7, Temperature: 98.2 F, Pulse: 72 bpm, Respiratory Rate: 18 breaths/min, Blood Pressure: 130/57 mmHg, Capillary Blood Glucose: 91 mg/dl. General Notes: patient reported last blood sugar was 91 General Notes: Wound exam; right BKA stump. Required debridement today with a #5 curette. Very adherent gritty material. No evidence of surrounding infection Integumentary (Hair, Skin) Wound #8 status is Open.  Original cause of wound was Surgical Injury. The wound is located on the Right,Medial Amputation Site - Below Knee. The wound measures 1cm length x 2.5cm width x 0.1cm depth; 1.963cm^2 area and 0.196cm^3 volume. There is Fat Layer (Subcutaneous Tissue) Exposed exposed. There is no tunneling or undermining noted. There is a medium amount of serosanguineous drainage noted. The wound margin is flat and intact. There is large (67-100%) red, pink granulation within the wound bed. There is a small (1-33%) amount of necrotic tissue within the wound bed including Adherent Slough. 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 There was a Excisional Skin/Subcutaneous Tissue Debridement with a total area of 2.5 sq cm performed by Maxwell Caulobson, Aysel Gilchrest G., MD. With the following instrument(s): Curette to remove Viable and Non-Viable tissue/material. Material removed includes Subcutaneous Tissue and Slough and after achieving pain control using Lidocaine 5% topical ointment. No specimens were taken. A time out was conducted at 13:50, prior  to the start of the procedure. A Moderate amount of bleeding was controlled with Pressure. The procedure was tolerated well with a pain level of 0 throughout and a pain level of 0 following the procedure. Post Debridement Measurements: 1cm length x 2.5cm width x 0.1cm depth; 0.196cm^3 volume. Character of Wound/Ulcer Post Debridement is improved. Post procedure Diagnosis Wound #8: Same as Pre-Procedure Plan Follow-up Appointments: Return Appointment in 2  weeks. Dressing Change Frequency: Change Dressing every other day. - home health to change twice a week. Wound Cleansing: May shower and wash wound with soap and water. Primary Wound Dressing: Wound #8 Right,Medial Amputation Site - Below Knee: Other: - iodoflex in clinic, home health to apply Ioplex Secondary Dressing: Wound #8 Right,Medial Amputation Site - Below Knee: Kerlix/Rolled Gauze ABD pad Edema Control: Elevate legs to the level of the heart or above for 30 minutes daily and/or when sitting, a frequency of: - 3-4 a day to aid in swelling. Additional Orders / Instructions: Stop/Decrease Smoking Follow Nutritious Diet - increase protein and vegetables within diabetic diet. Home Health: Continue Home Health skilled nursing for wound care. - [facility]. 1. Continuing with the Iodoflex here and Iodoflex at home. The wound is measuring smaller. I am a bit perturbed about the surface change today and the need for an extensive debridement but I was not prepared to change the dressing as we had such success with this Electronic Signature(s) Signed: 08/11/2019 6:03:12 PM By: Baltazar Najjar MD Entered By: Baltazar Najjar on 08/11/2019 14:12:42 -------------------------------------------------------------------------------- SuperBill Details Patient Name: Date of Service: Danielle Contes 08/11/2019 Medical Record [id number] Patient Account Number: [id number] Date of Birth/Sex: Treating RN: [birth year] (63 y.o. [name] Primary Care Provider: [name] Other Clinician: Referring Provider: Treating Provider/Extender:Chalise Pe, Ivin Booty, Dellia Nims in Treatment: 17 Diagnosis Coding ICD-10 Codes Code Description E11.51 Type 2 diabetes mellitus with diabetic peripheral angiopathy without gangrene E11.622 Type 2 diabetes mellitus with other skin ulcer L97.818 Non-pressure chronic ulcer of other part of right lower leg with other specified severity Z89.511  Acquired absence of right leg below knee Z89.612 Acquired absence of left leg above knee Facility Procedures The patient participates with Medicare or their insurance follows the Medicare Facility Guidelines: CPT4 Code Description Modifier Quantity [id number] 11042 - DEB SUBQ TISSUE 20 SQ CM/< 1 ICD-10 Diagnosis Description L97.818 Non-pressure chronic ulcer of  other part of right lower leg with other specified severity E11.622 Type 2 diabetes mellitus with other skin ulcer Physician Procedures CPT4 Code Description: [id number] 11042 - WC PHYS SUBQ TISS 20 SQ CM ICD-10 Diagnosis Description L97.818 Non-pressure chronic ulcer of other part of right lower leg w severity E11.622 Type 2 diabetes mellitus with other skin ulcer Modifier: ith other specified Quantity: 1 Electronic Signature(s) Signed: 08/11/2019 6:03:12 PM By: Baltazar Najjar MD Entered By: Baltazar Najjar on 08/11/2019 14:23:35

## 2019-09-17 DIAGNOSIS — Z4801 Encounter for change or removal of surgical wound dressing: Secondary | ICD-10-CM | POA: Diagnosis not present

## 2019-09-17 DIAGNOSIS — E1151 Type 2 diabetes mellitus with diabetic peripheral angiopathy without gangrene: Secondary | ICD-10-CM | POA: Diagnosis not present

## 2019-09-17 DIAGNOSIS — Z89511 Acquired absence of right leg below knee: Secondary | ICD-10-CM | POA: Diagnosis not present

## 2019-09-17 DIAGNOSIS — Z4781 Encounter for orthopedic aftercare following surgical amputation: Secondary | ICD-10-CM | POA: Diagnosis not present

## 2019-09-17 DIAGNOSIS — I10 Essential (primary) hypertension: Secondary | ICD-10-CM | POA: Diagnosis not present

## 2019-09-17 DIAGNOSIS — I2510 Atherosclerotic heart disease of native coronary artery without angina pectoris: Secondary | ICD-10-CM | POA: Diagnosis not present

## 2019-09-17 DIAGNOSIS — F17210 Nicotine dependence, cigarettes, uncomplicated: Secondary | ICD-10-CM | POA: Diagnosis not present

## 2019-09-17 DIAGNOSIS — I70209 Unspecified atherosclerosis of native arteries of extremities, unspecified extremity: Secondary | ICD-10-CM | POA: Diagnosis not present

## 2019-09-17 DIAGNOSIS — E1142 Type 2 diabetes mellitus with diabetic polyneuropathy: Secondary | ICD-10-CM | POA: Diagnosis not present

## 2019-09-22 ENCOUNTER — Encounter (HOSPITAL_BASED_OUTPATIENT_CLINIC_OR_DEPARTMENT_OTHER): Payer: Medicare HMO | Admitting: Internal Medicine

## 2019-09-22 ENCOUNTER — Other Ambulatory Visit: Payer: Self-pay

## 2019-09-22 DIAGNOSIS — T8789 Other complications of amputation stump: Secondary | ICD-10-CM | POA: Diagnosis not present

## 2019-09-23 NOTE — Progress Notes (Signed)
Danielle Buchanan, Danielle Buchanan ([id number]) Visit Report for 09/22/2019 HPI Details Patient Name: Date of Service: Danielle Buchanan, Danielle Buchanan 09/22/2019 12:30 PM Medical Record [id number] Patient Account Number: [id number] Date of Birth/Sex: Treating RN: Jan 10, 1956 (63 y.o. F) Primary Care Provider: [name] Other Clinician: Referring Provider: Treating Provider/Extender:Green Quincy, Ivin Booty, Dellia Nims in Treatment: 23 History of Present Illness Location: right foot Quality: Patient reports experiencing a dull pain to affected area(s). Severity: Patient states wound(s) are getting worse. Duration: Patient has had the wound for > 3 months prior to seeking treatment at the wound center Timing: Pain in wound is constant (hurts all the time) Context: the original wound on the right foot is the same and she's recently had a left below-knee amputation Modifying Factors: Patient wound(s)/ulcer(s) are worsening due to :significant pain at rest HPI Description: 63 year old diabetic patient with a past medical history of essential hypertension, hyper lipidemia, PTSD, was seen recently and a walk-in clinic a week ago for pain and ulcers on the feet which she's had for 2 months. The diagnosis of foot cellulitis was made and the patient was put on Bactrim DS for 14 days along with Neurontin and pain medication and asked to see the wound center. The patient has been seen by the endocrinologist [name] and noted that she has had diabetes since 1992 and has polyneuropathy and peripheral arterial disease.She is a smoker and smokes about a pack of cigarettes a day. She was recently referred to a dermatologist. Most recently she had a lower arterial duplex study done which showed the patient had no evidence of significant right or left lower extremity arterial disease based on the ABI which was 1.09 on the right and 1.01 on the left with triphasic flow except the left anterior tibial which suggest arterial  occlusive disease. The bilateral toe brachial indices were abnormal with the right being 0.23 and the left being 0.24. the patient also had an arterial duplex examination which showed plaque bilaterally in the common femoral artery and the proximal superficial femoral artery and also in the popliteal artery and the right anterior tibial artery and the left posterior tibial artery. These tests were requested by her primary care physician Dr. [name], but the patient and her caregiver who was her nephew say they did not receive any call back regarding the test nor have they been told to go to a vascular surgeon. 06/19/2017 -- was seen by [name] who has scheduled her for a aortogram and runoff on 06/24/2017-- he suspects this is going to be microvascular disease in addition to her diabetic ulcers. The benefit from an angiogram may be just as a baseline study to see if there is any pedal work that can be done to help improve the blood flow. He also discussed that she may lose her toes or have more proximal amputation of her limbs. She was recently admitted to the hospital between 06/09/2017 and 06/11/2017 with uncontrolled diabetes mellitus type 2, hypertension, nicotine dependence, worsening leg leg pain. She was treated appropriately for cellulitis she was sent home on Clindamycin 300 mg 3 times a day. Left foot x-ray done during this admission showed no radiographic evidence of osteomyelitis. Last hemoglobin A1c was 11.6% She has given up smoking for the last week. 06/26/2017 -- she was seen by [name] recently and an angiogram was done and found that she does not have any options for reconstruction but does have flow bilaterally to the ankles. He was hopeful that with wound  care and smoking sensation she could heal her wounds. Procedure performed on 06/24/2017 was a aortogram with bilateral lower extremity runoff. The findings were that of a patent aortobiiliac segments with a 30%  stenosis of the left SFA and otherwise patent runoff to the popliteal arteries bilaterally. Predominant runoff on the right is via the peroneal artery with not much flow to the right foot. On the left side dominant runoff is via the posterior tibial artery and there is also peroneal artery. There is no appreciable plantar arch on the left either. No intervention was undertaken. 07/17/2017 -- the initial paperwork to [facility] has denied her treatment for hyperbaric oxygen therapy. We will re- file the request. In the meanwhile she is scheduled to see [name] on Friday for further discussions regarding amputation of her left forefoot. she was in the ER on 07/12/2017 and I have reviewed these notes including the workup and the treatment plan and I agree with this. 08/21/2017 -- the patient was in and out of the hospital after a revision surgery on 08/12/2017 where she underwent a revision left below-knee amputation after she had a fall and distracted the previous amputation site. Her first BKA on that side was done on 07/23/2017. Further amount of necrotic tissue and bone was resected and primary closure was performed. she was discharged home on 08/15/2017 the patient is been using Santyl ointment on the right foot and the left below-knee amputation site is under the care of her surgeon [name]. She says her blood glucose control has been good and she still not smoking READMISSION 04/09/2019 This is a now 63 year old woman who we cared for in this clinic in 2018 predominantly with a left foot wound. She ultimately had a left BKA and a left U KA. More recently she developed wounds on her feet on the right. She underwent a right BKA on 01/19/2019 for gangrene of the right foot. Her nephew is present tells me that the staples were removed about 2 weeks after that and she had Steri-Strips placed. They noted some drainage under the Steri- Strips and some black discoloration. This reopened with a  fairly large wound on the medial aspect of the stump incision and a smaller eschar laterally. Looking through the notes there may have been a stump infection in April as well. They have been using wet-to-dry dressings to this area twice daily. They are referred here by vein and vascular Dr. Darrick Penna for our review. Past medical history includes type 2 diabetes with a history of polyneuropathy and PAD. Last hemoglobin A1c at 8.5. Hypertension hyperlipidemia, coronary artery disease, continued tobacco abuse at 1/2 pack/day and some degree of memory decline is listed on her problem list Not possible to do ABIs of course however it is listed that it 1.2 years ago the angiogram suggest that there was enough blood flow down to the level of the ankle. She has had a angioplasty of her right posterior tibial artery and the right tibioperoneal trunk in February 2020 and then a right transmetatarsal amputation in February 2020 before the below-knee amputation. I do not see specifically the angiogram which I will need to look up at a later time. Social history; patient lives alone although she has help from family for her dressings. She has a continued half a pack per day smoker 7/16; difficult wound on the right BKA stump. There is a large wound anteriorly and laterally a small satellite area. We have been using Santyl. She tells me she  is got her cigarette smoking down to 4 cigarettes a day 7/23; difficult wounds on the right BKA stump. There is a large wound anteriorly and medially and a smaller area laterally. We have been using Santyl. Family is changing this daily. Still smoking 4 cigarettes a day. 7/30; no real improvement in the area on the right BKA stump. There is a large wound anterior and medially and a much smaller wound laterally. Both of these have necrotic surface is certainly not viable over the large wound. Been using Santyl for several weeks without real improvement. Changed to Iodoflex  today 8/13-Is back at 2 weeks, the wound of the right BKA stump looks about the same perhaps marginally better, with some granulation tissue formation, we are using Iodoflex. The much smaller lateral wound is healed 9/3; the right BKA stump wound is slightly smaller. Surface quite a bit better. She has palpable popliteal pulses. We have been using Iodoflex. Her nephew to helps her with the dressings and washing of the wound I think they have made some progress 9/22; the right BKA stump is slightly smaller. Probably 80% of this as healthy granulation now. Debrided with Anasept and gauze no mechanical debridement. We have been ordering Iodoflex but apparently getting [facility] who apparently do not have the Iodoflex 10/6; right BKA stump 2-week follow-up. We have been using Iodoflex but [facility] is providing an iodine-based polymer foam dressing called Iodoplex. These may not be comparable. They got a very large supply of this at home. 10/20; right BKA stump 2-week follow-up. Using Iodoplex at home. Changing this every second day. The wound is making nice progress measures smaller today 11/3; right BKA stump. 2-week follow-up. Using Iodoflex at home. For the first time recently a completely nonviable surface which was difficult to explain. No evidence of infection. Required debridement in spite of this reasonably bad news the surface area of the wound is better 11/17; right BKA stump. Using Iodoflex. Much better appearance this week required mechanical debridement last week none done today 12/1; right BKA stump. 2-week follow-up. We have been using Iodoflex. She comes in today with a much better looking wound surface and a smaller surface area 12/15; right BKA stump. 2-week follow-up. We have been using Iodoflex wound is smaller and healthy looking Electronic Signature(s) Signed: 09/22/2019 5:53:46 PM By: Baltazar Najjar MD Entered By: Baltazar Najjar on  09/22/2019 13:45:00 -------------------------------------------------------------------------------- Physical Exam Details Patient Name: Date of Service: Danielle Buchanan. 09/22/2019 12:30 PM Medical Record [id number] Patient Account Number: [id number] Date of Birth/Sex: Treating RN: 10-Mar-1956 (63 y.o. F) Primary Care Provider: [name] Other Clinician: Referring Provider: Treating Provider/Extender:Michaeljohn Biss, Ivin Booty, Dellia Nims in Treatment: 23 Constitutional Patient is hypertensive.. Pulse regular and within target range for patient.Marland Kitchen Respirations regular, non-labored and within target range.. Temperature is normal and within the target range for the patient.Marland Kitchen Appears in no distress. Eyes Conjunctivae clear. No discharge.no icterus. Respiratory work of breathing is normal. Cardiovascular Dorsalis pedis pulses palpable. Integumentary (Hair, Skin) No erythema around the wound. Psychiatric appears at normal baseline. Notes Wound exam; right BKA stump. Only a very small wound remains. No mechanical debridement was felt to be necessary. Surface looks healthy. Electronic Signature(s) Signed: 09/22/2019 5:53:46 PM By: Baltazar Najjar MD Entered By: Baltazar Najjar on 09/22/2019 13:46:18 -------------------------------------------------------------------------------- Physician Orders Details Patient Name: Date of Service: Danielle Buchanan. 09/22/2019 12:30 PM Medical Record [id number] Patient Account Number: [id number] Date of Birth/Sex: Treating RN: [birth year] (63 y.o. F) Epps,  Lyla Sonarrie Primary Care Provider: Shirlean MylarWebb, Carol Other Clinician: Referring Provider: Treating Provider/Extender:Buffie Herne, Ivin BootyMichael Webb, Dellia Nimsarol Weeks in Treatment: 23 Verbal / Phone Orders: No Diagnosis Coding ICD-10 Coding Code Description E11.51 Type 2 diabetes mellitus with diabetic peripheral angiopathy without gangrene E11.622 Type 2 diabetes mellitus with other skin ulcer L97.818  Non-pressure chronic ulcer of other part of right lower leg with other specified severity Z89.511 Acquired absence of right leg below knee Z89.612 Acquired absence of left leg above knee Follow-up Appointments Return appointment in 3 weeks. Dressing Change Frequency Change Dressing every other day. - home health to change twice a week. Wound Cleansing May shower and wash wound with soap and water. Primary Wound Dressing Wound #8 Right,Medial Amputation Site - Below Knee Other: - iodoflex in clinic, home health to apply Ioplex Secondary Dressing Wound #8 Right,Medial Amputation Site - Below Knee Kerlix/Rolled Gauze ABD pad Edema Control Elevate legs to the level of the heart or above for 30 minutes daily and/or when sitting, a frequency of: - 3-4 a day to aid in swelling. Additional Orders / Instructions Stop/Decrease Smoking Follow Nutritious Diet - increase protein and vegetables within diabetic diet. Home Health Continue Home Health skilled nursing for wound care. - [facility]. Electronic Signature(s) Signed: 09/22/2019 5:53:46 PM By: Baltazar Najjarobson, Sathvik Tiedt MD Signed: 09/23/2019 9:49:12 AM By: Yevonne PaxEpps, Carrie RN Entered By: Yevonne PaxEpps, Carrie on 09/22/2019 13:32:00 -------------------------------------------------------------------------------- Problem List Details Patient Name: Date of Service: Danielle ContesWEINKE, Danielle M. 09/22/2019 12:30 PM Medical Record [id number] Patient Account Number: [id number] Date of Birth/Sex: Treating RN: October 14, 1955 (63 y.o. [name]) Epps, Carrie Primary Care Provider: Shirlean MylarWebb, Carol Other Clinician: Referring Provider: Treating Provider/Extender:Gracey Tolle, Ivin BootyMichael Webb, Dellia Nimsarol Weeks in Treatment: 23 Active Problems ICD-10 Evaluated Encounter Code Description Active Date Today Diagnosis E11.51 Type 2 diabetes mellitus with diabetic peripheral 04/09/2019 No Yes angiopathy without gangrene E11.622 Type 2 diabetes mellitus with other skin ulcer 04/09/2019 No Yes L97.818  Non-pressure chronic ulcer of other part of right lower 04/09/2019 No Yes leg with other specified severity Z89.511 Acquired absence of right leg below knee 04/09/2019 No Yes Z89.612 Acquired absence of left leg above knee 04/09/2019 No Yes Inactive Problems Resolved Problems Electronic Signature(s) Signed: 09/22/2019 5:53:46 PM By: Baltazar Najjarobson, Macyn Shropshire MD Entered By: Baltazar Najjarobson, Natahlia Hoggard on 09/22/2019 13:44:15 -------------------------------------------------------------------------------- Progress Note Details Patient Name: Date of Service: Danielle ContesWEINKE, Danielle M. 09/22/2019 12:30 PM Medical Record [id number] Patient Account Number: [id number] Date of Birth/Sex: Treating RN: October 14, 1955 (63 y.o. F) Primary Care Provider: Shirlean MylarWebb, Carol Other Clinician: Referring Provider: Treating Provider/Extender:Ora Bollig, Ivin BootyMichael Webb, Dellia Nimsarol Weeks in Treatment: 23 Subjective History of Present Illness (HPI) The following HPI elements were documented for the patient's wound: Location: right foot Quality: Patient reports experiencing a dull pain to affected area(s). Severity: Patient states wound(s) are getting worse. Duration: Patient has had the wound for > 3 months prior to seeking treatment at the wound center Timing: Pain in wound is constant (hurts all the time) Context: the original wound on the right foot is the same and she's recently had a left below-knee amputation Modifying Factors: Patient wound(s)/ulcer(s) are worsening due to :significant pain at rest 63 year old diabetic patient with a past medical history of essential hypertension, hyper lipidemia, PTSD, was seen recently and a walk-in clinic a week ago for pain and ulcers on the feet which she's had for 2 months. The diagnosis of foot cellulitis was made and the patient was put on Bactrim DS for 14 days along with Neurontin and pain medication and asked to see the wound center. The  patient has been seen by the endocrinologist [name] and  noted that she has had diabetes since 1992 and has polyneuropathy and peripheral arterial disease.She is a smoker and smokes about a pack of cigarettes a day. She was recently referred to a dermatologist. Most recently she had a lower arterial duplex study done which showed the patient had no evidence of significant right or left lower extremity arterial disease based on the ABI which was 1.09 on the right and 1.01 on the left with triphasic flow except the left anterior tibial which suggest arterial occlusive disease. The bilateral toe brachial indices were abnormal with the right being 0.23 and the left being 0.24. the patient also had an arterial duplex examination which showed plaque bilaterally in the common femoral artery and the proximal superficial femoral artery and also in the popliteal artery and the right anterior tibial artery and the left posterior tibial artery. These tests were requested by her primary care physician Dr. [name], but the patient and her caregiver who was her nephew say they did not receive any call back regarding the test nor have they been told to go to a vascular surgeon. 06/19/2017 -- was seen by [name] who has scheduled her for a aortogram and runoff on 06/24/2017-- he suspects this is going to be microvascular disease in addition to her diabetic ulcers. The benefit from an angiogram may be just as a baseline study to see if there is any pedal work that can be done to help improve the blood flow. He also discussed that she may lose her toes or have more proximal amputation of her limbs. She was recently admitted to the hospital between 06/09/2017 and 06/11/2017 with uncontrolled diabetes mellitus type 2, hypertension, nicotine dependence, worsening leg leg pain. She was treated appropriately for cellulitis she was sent home on Clindamycin 300 mg 3 times a day. Left foot x-ray done during this admission showed no radiographic evidence of  osteomyelitis. Last hemoglobin A1c was 11.6% She has given up smoking for the last week. 06/26/2017 -- she was seen by [name] recently and an angiogram was done and found that she does not have any options for reconstruction but does have flow bilaterally to the ankles. He was hopeful that with wound care and smoking sensation she could heal her wounds. Procedure performed on 06/24/2017 was a aortogram with bilateral lower extremity runoff. The findings were that of a patent aortobiiliac segments with a 30% stenosis of the left SFA and otherwise patent runoff to the popliteal arteries bilaterally. Predominant runoff on the right is via the peroneal artery with not much flow to the right foot. On the left side dominant runoff is via the posterior tibial artery and there is also peroneal artery. There is no appreciable plantar arch on the left either. No intervention was undertaken. 07/17/2017 -- the initial paperwork to [facility] has denied her treatment for hyperbaric oxygen therapy. We will re- file the request. In the meanwhile she is scheduled to see [name] on Friday for further discussions regarding amputation of her left forefoot. she was in the ER on 07/12/2017 and I have reviewed these notes including the workup and the treatment plan and I agree with this. 08/21/2017 -- the patient was in and out of the hospital after a revision surgery on 08/12/2017 where she underwent a revision left below-knee amputation after she had a fall and distracted the previous amputation site. Her first BKA on that side was done  on 07/23/2017. Further amount of necrotic tissue and bone was resected and primary closure was performed. she was discharged home on 08/15/2017 the patient is been using Santyl ointment on the right foot and the left below-knee amputation site is under the care of her surgeon [name]. She says her blood glucose control has been good and she still not  smoking READMISSION 04/09/2019 This is a now 63 year old woman who we cared for in this clinic in 2018 predominantly with a left foot wound. She ultimately had a left BKA and a left U KA. More recently she developed wounds on her feet on the right. She underwent a right BKA on 01/19/2019 for gangrene of the right foot. Her nephew is present tells me that the staples were removed about 2 weeks after that and she had Steri-Strips placed. They noted some drainage under the Steri- Strips and some black discoloration. This reopened with a fairly large wound on the medial aspect of the stump incision and a smaller eschar laterally. Looking through the notes there may have been a stump infection in April as well. They have been using wet-to-dry dressings to this area twice daily. They are referred here by vein and vascular Dr. Darrick Penna for our review. Past medical history includes type 2 diabetes with a history of polyneuropathy and PAD. Last hemoglobin A1c at 8.5. Hypertension hyperlipidemia, coronary artery disease, continued tobacco abuse at 1/2 pack/day and some degree of memory decline is listed on her problem list Not possible to do ABIs of course however it is listed that it 1.2 years ago the angiogram suggest that there was enough blood flow down to the level of the ankle. She has had a angioplasty of her right posterior tibial artery and the right tibioperoneal trunk in February 2020 and then a right transmetatarsal amputation in February 2020 before the below-knee amputation. I do not see specifically the angiogram which I will need to look up at a later time. Social history; patient lives alone although she has help from family for her dressings. She has a continued half a pack per day smoker 7/16; difficult wound on the right BKA stump. There is a large wound anteriorly and laterally a small satellite area. We have been using Santyl. She tells me she is got her cigarette smoking down to 4  cigarettes a day 7/23; difficult wounds on the right BKA stump. There is a large wound anteriorly and medially and a smaller area laterally. We have been using Santyl. Family is changing this daily. Still smoking 4 cigarettes a day. 7/30; no real improvement in the area on the right BKA stump. There is a large wound anterior and medially and a much smaller wound laterally. Both of these have necrotic surface is certainly not viable over the large wound. Been using Santyl for several weeks without real improvement. Changed to Iodoflex today 8/13-Is back at 2 weeks, the wound of the right BKA stump looks about the same perhaps marginally better, with some granulation tissue formation, we are using Iodoflex. The much smaller lateral wound is healed 9/3; the right BKA stump wound is slightly smaller. Surface quite a bit better. She has palpable popliteal pulses. We have been using Iodoflex. Her nephew to helps her with the dressings and washing of the wound I think they have made some progress 9/22; the right BKA stump is slightly smaller. Probably 80% of this as healthy granulation now. Debrided with Anasept and gauze no mechanical debridement. We have been ordering  Iodoflex but apparently getting [facility] from [facility] who apparently do not have the Iodoflex 10/6; right BKA stump 2-week follow-up. We have been using Iodoflex but [facility] is providing an iodine-based polymer foam dressing called Iodoplex. These may not be comparable. They got a very large supply of this at home. 10/20; right BKA stump 2-week follow-up. Using Iodoplex at home. Changing this every second day. The wound is making nice progress measures smaller today 11/3; right BKA stump. 2-week follow-up. Using Iodoflex at home. For the first time recently a completely nonviable surface which was difficult to explain. No evidence of infection. Required debridement in spite of this reasonably bad news the  surface area of the wound is better 11/17; right BKA stump. Using Iodoflex. Much better appearance this week required mechanical debridement last week none done today 12/1; right BKA stump. 2-week follow-up. We have been using Iodoflex. She comes in today with a much better looking wound surface and a smaller surface area 12/15; right BKA stump. 2-week follow-up. We have been using Iodoflex wound is smaller and healthy looking Objective Constitutional Patient is hypertensive.. Pulse regular and within target range for patient.Marland Kitchen Respirations regular, non-labored and within target range.. Temperature is normal and within the target range for the patient.Marland Kitchen Appears in no distress. Vitals Time Taken: 12:50 PM, Height: 62 in, Weight: 190 lbs, BMI: 34.7, Temperature: 98.6 F, Pulse: 70 bpm, Respiratory Rate: 20 breaths/min, Blood Pressure: 151/64 mmHg, Capillary Blood Glucose: 160 mg/dl. Eyes Conjunctivae clear. No discharge.no icterus. Respiratory work of breathing is normal. Cardiovascular Dorsalis pedis pulses palpable. Psychiatric appears at normal baseline. General Notes: Wound exam; right BKA stump. Only a very small wound remains. No mechanical debridement was felt to be necessary. Surface looks healthy. Integumentary (Hair, Skin) No erythema around the wound. Wound #8 status is Open. Original cause of wound was Surgical Injury. The wound is located on the Right,Medial Amputation Site - Below Knee. The wound measures 0.4cm length x 0.6cm width x 0.1cm depth; 0.188cm^2 area and 0.019cm^3 volume. There is Fat Layer (Subcutaneous Tissue) Exposed exposed. There is no tunneling or undermining noted. There is a small amount of serosanguineous drainage noted. The wound margin is flat and intact. There is large (67-100%) red granulation within the wound bed. There is no necrotic tissue within the wound bed. 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 Dressing Change Frequency: Change Dressing every other day. - home health to change twice a week. Wound Cleansing: May shower and wash wound with soap and water. Primary Wound Dressing: Wound #8 Right,Medial Amputation Site - Below Knee: Other: - iodoflex in clinic, home health to apply Ioplex Secondary Dressing: Wound #8 Right,Medial Amputation Site - Below Knee: Kerlix/Rolled Gauze ABD pad Edema Control: Elevate legs to the level of the heart or above for 30 minutes daily and/or when sitting, a frequency of: - 3-4 a day to aid in swelling. Additional Orders / Instructions: Stop/Decrease Smoking Follow Nutritious Diet - increase protein and vegetables within diabetic diet. Home Health: [facility] for wound care. - [facility]. 1. The wound looks very healthy 2. Continuing Iodoflex 3. I have warned her that it will take  several weeks after this closes for the area to be mature enough to consider a prosthesis Electronic Signature(s) Signed: 09/22/2019 5:53:46 PM By: Baltazar Najjar MD Entered By: Baltazar Najjar on 09/22/2019 13:48:44 -------------------------------------------------------------------------------- SuperBill Details Patient Name: Date of Service: Danielle Buchanan 09/22/2019 Medical Record [id number] Patient Account Number: [id number] Date of Birth/Sex: Treating RN: 05-29-1956 (63 y.o. F) Primary Care Provider: [name] Other Clinician: Referring Provider: Treating Provider/Extender:Moishy Laday, Ivin Booty, Dellia Nims in Treatment: 23 Diagnosis Coding ICD-10 Codes Code Description E11.51 Type 2 diabetes mellitus  with diabetic peripheral angiopathy without gangrene E11.622 Type 2 diabetes mellitus with other skin ulcer L97.818 Non-pressure chronic ulcer of other part of right lower leg with other specified severity Z89.511 Acquired absence of right leg below knee Z89.612 Acquired absence of left leg above knee Physician Procedures CPT4 Code Description: [id number] - WC PHYS LEVEL 3 - EST PT ICD-10 Diagnosis Description E11.622 Type 2 diabetes mellitus with other skin ulcer L97.818 Non-pressure chronic ulcer of other part of right lower leg wi severity Z89.511 Acquired absence  of right leg below knee Modifier: th other specified Quantity: 1 Electronic Signature(s) Signed: 09/22/2019 5:53:46 PM By: Baltazar Najjar MD Entered By: Baltazar Najjar on 09/22/2019 13:49:09

## 2019-09-24 DIAGNOSIS — I70209 Unspecified atherosclerosis of native arteries of extremities, unspecified extremity: Secondary | ICD-10-CM | POA: Diagnosis not present

## 2019-09-24 DIAGNOSIS — Z4801 Encounter for change or removal of surgical wound dressing: Secondary | ICD-10-CM | POA: Diagnosis not present

## 2019-09-24 DIAGNOSIS — E1151 Type 2 diabetes mellitus with diabetic peripheral angiopathy without gangrene: Secondary | ICD-10-CM | POA: Diagnosis not present

## 2019-09-24 DIAGNOSIS — I10 Essential (primary) hypertension: Secondary | ICD-10-CM | POA: Diagnosis not present

## 2019-09-24 DIAGNOSIS — Z89511 Acquired absence of right leg below knee: Secondary | ICD-10-CM | POA: Diagnosis not present

## 2019-09-24 DIAGNOSIS — Z4781 Encounter for orthopedic aftercare following surgical amputation: Secondary | ICD-10-CM | POA: Diagnosis not present

## 2019-09-24 DIAGNOSIS — E1142 Type 2 diabetes mellitus with diabetic polyneuropathy: Secondary | ICD-10-CM | POA: Diagnosis not present

## 2019-09-24 DIAGNOSIS — I2510 Atherosclerotic heart disease of native coronary artery without angina pectoris: Secondary | ICD-10-CM | POA: Diagnosis not present

## 2019-09-24 DIAGNOSIS — F17210 Nicotine dependence, cigarettes, uncomplicated: Secondary | ICD-10-CM | POA: Diagnosis not present

## 2019-09-24 NOTE — Progress Notes (Signed)
Danielle Buchanan, Danielle Buchanan ([id number]) Visit Report for 09/22/2019 Arrival Information Details Patient Name: Date of Service: Danielle Buchanan, Danielle Buchanan 09/22/2019 12:30 PM Medical Record [id number] Patient Account Number: [id number] Date of Birth/Sex: Treating RN: 06-12-1956 (63 y.o. Danielle Buchanan, Tammi Klippel Primary Care Naina Sleeper: Maurice Small Other Clinician: Referring Ariyon Gerstenberger: Treating Kailani Brass/Extender:Robson, Michiel Cowboy, Judson Roch in Treatment: 23 Visit Information History Since Last Visit Added or deleted any medications: No Patient Arrived: Wheel Chair Any new allergies or adverse reactions: No Arrival Time: 12:50 Had a fall or experienced change in No activities of daily living that may affect Accompanied By: nephew risk of falls: Transfer Assistance: None Signs or symptoms of abuse/neglect since last No Patient Identification Verified: Yes visito Secondary Verification Process Completed: Yes Hospitalized since last visit: No Patient Requires Transmission-Based No Implantable device outside of the clinic excluding No Precautions: cellular tissue based products placed in the center Patient Has Alerts: No since last visit: Has Dressing in Place as Prescribed: Yes Pain Present Now: No Electronic Signature(s) Signed: 09/22/2019 1:45:50 PM By: Deon Pilling Entered By: Deon Pilling on 09/22/2019 12:59:53 -------------------------------------------------------------------------------- Encounter Discharge Information Details Patient Name: Date of Service: Danielle Macho. 09/22/2019 12:30 PM Medical Record [id number] Patient Account Number: [id number] Date of Birth/Sex: Treating RN: [birth year] (63 y.o. Danielle Buchanan Primary Care Newell Wafer: Maurice Small Other Clinician: Referring Trey Bebee: Treating Shalane Florendo/Extender:Robson, Michiel Cowboy, Judson Roch in Treatment: 23 Encounter Discharge Information Items Discharge Condition: Stable Ambulatory Status:  Wheelchair Discharge Destination: Home Transportation: Private Auto Accompanied By: son Schedule Follow-up Appointment: Yes Clinical Summary of Care: Patient Declined Electronic Signature(s) Signed: 09/24/2019 5:33:40 PM By: Kela Millin Entered By: Kela Millin on 09/22/2019 13:37:42 -------------------------------------------------------------------------------- Lower Extremity Assessment Details Patient Name: Date of Service: Danielle Macho. 09/22/2019 12:30 PM Medical Record [id number] Patient Account Number: [id number] Date of Birth/Sex: Treating RN: [birth year] (63 y.o. Danielle Buchanan Primary Care Melaney Tellefsen: Maurice Small Other Clinician: Referring Alonda Weaber: Treating Tensley Wery/Extender:Robson, Michiel Cowboy, Judson Roch in Treatment: 23 Electronic Signature(s) Signed: 09/22/2019 1:45:50 PM By: Deon Pilling Entered By: Deon Pilling on 09/22/2019 13:00:56 -------------------------------------------------------------------------------- Multi Wound Chart Details Patient Name: Date of Service: Danielle Macho. 09/22/2019 12:30 PM Medical Record [id number] Patient Account Number: [id number] Date of Birth/Sex: Treating RN: May 19, 1956 (63 y.o. F) Primary Care Michaelina Blandino: Maurice Small Other Clinician: Referring Shenay Torti: Treating Aeisha Minarik/Extender:Robson, Michiel Cowboy, Judson Roch in Treatment: 23 Vital Signs Height(in): 62 Capillary Blood 160 Glucose(mg/dl): Weight(lbs): 190 Pulse(bpm): 76 Body Mass Index(BMI): 35 Blood Pressure(mmHg): 151/64 Temperature(F): 98.6 Respiratory 20 Rate(breaths/min): Photos: [8:No Photos] [N/A:N/A] Wound Location: [8:Right Amputation Site - Below Knee - Medial] [N/A:N/A] Wounding Event: [8:Surgical Injury] [N/A:N/A] Primary Etiology: [8:Dehisced Wound] [N/A:N/A] Comorbid History: [8:Hypertension, Peripheral N/A Arterial Disease, Type II Diabetes, Neuropathy] Date Acquired: [8:04/01/2019] [N/A:N/A] Weeks of Treatment:  [8:23] [N/A:N/A] Wound Status: [8:Open] [N/A:N/A] Measurements L x W x D 0.4x0.6x0.1 [N/A:N/A] (cm) Area (cm) : [8:0.188] [N/A:N/A] Volume (cm) : [8:0.019] [N/A:N/A] % Reduction in Area: [8:98.10%] [N/A:N/A] % Reduction in Volume: 98.10% [N/A:N/A] Classification: [8:Full Thickness Without Exposed Support Structures] [N/A:N/A] Exudate Amount: [8:Small] [N/A:N/A] Exudate Type: [8:Serosanguineous] [N/A:N/A] Exudate Color: [8:red, brown] [N/A:N/A] Wound Margin: [8:Flat and Intact] [N/A:N/A] Granulation Amount: [8:Large (67-100%)] [N/A:N/A] Granulation Quality: [8:Red] [N/A:N/A] Necrotic Amount: [8:None Present (0%)] [N/A:N/A] Exposed Structures: [8:Fat Layer (Subcutaneous N/A Tissue) Exposed: Yes Fascia: No Tendon: No Muscle: No Joint: No Bone: No Large (67-100%)] [N/A:N/A] Treatment Notes Wound #8 (Right, Medial Amputation Site - Below Knee) 1. Cleanse With Wound Cleanser 2. Periwound Care Skin Prep 3. Primary Dressing Applied Iodoflex 4. Secondary Dressing  ABD Pad Dry Gauze Roll Gauze 5. Secured With Tape Notes netting. Electronic Signature(s) Signed: 09/22/2019 5:53:46 PM By: Baltazar Najjarobson, Michael MD Entered By: Baltazar Najjarobson, Michael on 09/22/2019 13:44:22 -------------------------------------------------------------------------------- Multi-Disciplinary Care Plan Details Patient Name: Date of Service: Danielle Buchanan, Danielle M. 09/22/2019 12:30 PM Medical Record [id number] Patient Account Number: [id number] Date of Birth/Sex: Treating RN: 1956-08-19 (63 y.o. Danielle Buchanan) Epps, Carrie Primary Care Sky Primo: Shirlean MylarWebb, Carol Other Clinician: Referring Vivika Poythress: Treating Helina Hullum/Extender:Robson, Ivin BootyMichael Webb, Dellia Nimsarol Weeks in Treatment: 23 Active Inactive Pain, Acute or Chronic Nursing Diagnoses: Pain, acute or chronic: actual or potential Potential alteration in comfort, pain Goals: Patient will verbalize adequate pain control and receive pain control interventions during procedures as  needed Date Initiated: 04/09/2019 Target Resolution Date: 10/16/2019 Goal Status: Active Interventions: Provide education on pain management Reposition patient for comfort Treatment Activities: Administer pain control measures as ordered : 04/09/2019 Notes: Electronic Signature(s) Signed: 09/23/2019 9:49:12 AM By: Yevonne PaxEpps, Carrie RN Entered By: Yevonne PaxEpps, Carrie on 09/22/2019 13:17:19 -------------------------------------------------------------------------------- Pain Assessment Details Patient Name: Date of Service: Danielle Buchanan, Danielle M. 09/22/2019 12:30 PM Medical Record [id number] Patient Account Number: [id number] Date of Birth/Sex: Treating RN: 1956-08-19 (63 y.o. Arta SilenceF) Deaton, Bobbi Primary Care Edye Hainline: Shirlean MylarWebb, Carol Other Clinician: Referring Aleeya Veitch: Treating Raychelle Hudman/Extender:Robson, Ivin BootyMichael Webb, Dellia Nimsarol Weeks in Treatment: 23 Active Problems Location of Pain Severity and Description of Pain Patient Has Paino No Site Locations Rate the pain. Current Pain Level: 0 Pain Management and Medication Current Pain Management: Medication: No Cold Application: No Rest: No Massage: No Activity: No T.E.N.S.: No Heat Application: No Leg drop or elevation: No Is the Current Pain Management Adequate: Adequate How does your wound impact your activities of daily livingo Sleep: No Bathing: No Appetite: No Relationship With Others: No Bladder Continence: No Emotions: No Bowel Continence: No Work: No Toileting: No Drive: No Dressing: No Hobbies: No Notes phantom pains at times to Bilateral BKA. Electronic Signature(s) Signed: 09/22/2019 1:45:50 PM By: Shawn Stalleaton, Bobbi Entered By: Shawn Stalleaton, Bobbi on 09/22/2019 13:00:51 -------------------------------------------------------------------------------- Patient/Caregiver Education Details Patient Name: Date of Service: Danielle Buchanan, Danielle M. 12/15/2020andnbsp12:30 PM Medical Record Patient Account Number:  [id number] Number: Treating RN: Yevonne PaxEpps, Carrie Date of Birth/Gender: 1956-08-19 (63 y.o. F) Other Clinician: Primary Care Physician: Shirlean MylarWebb, Carol Treating Baltazar Najjarobson, Michael Referring Physician: Physician/Extender: Lennie OdorWebb, Carol Weeks in Treatment: 23 Education Assessment Education Provided To: Patient Education Topics Provided Pain: Methods: Explain/Verbal Responses: State content correctly Wound/Skin Impairment: Methods: Explain/Verbal Responses: State content correctly Electronic Signature(s) Signed: 09/23/2019 9:49:12 AM By: Yevonne PaxEpps, Carrie RN Entered By: Yevonne PaxEpps, Carrie on 09/22/2019 13:17:44 -------------------------------------------------------------------------------- Wound Assessment Details Patient Name: Date of Service: Danielle Buchanan, Danielle M. 09/22/2019 12:30 PM Medical Record [id number] Patient Account Number: [id number] Date of Birth/Sex: Treating RN: 1956-08-19 (63 y.o. Debara PickettF) Deaton, Yvonne KendallBobbi Primary Care Antavius Sperbeck: Shirlean MylarWebb, Carol Other Clinician: Referring Antavius Sperbeck: Treating Richey Doolittle/Extender:Robson, Ivin BootyMichael Webb, Dellia Nimsarol Weeks in Treatment: 23 Wound Status Wound Number: 8 Primary Dehisced Wound Etiology: Wound Location: Right Amputation Site - Below Knee - Medial Wound Open Status: Wounding Event: Surgical Injury Comorbid Hypertension, Peripheral Arterial Disease, Date Acquired: 04/01/2019 History: Type II Diabetes, Neuropathy Weeks Of Treatment: 23 Clustered Wound: No Photos Wound Measurements Length: (cm) 0.4 % Reductio Width: (cm) 0.6 % Reductio Depth: (cm) 0.1 Epithelial Area: (cm) 0.188 Tunneling Volume: (cm) 0.019 Undermini Wound Description Classification: Full Thickness Without Exposed Support Foul O Structures Slough Wound Flat and Intact Margin: Exudate Small Amount: Exudate Serosanguineous Type: Exudate red, brown Color: Wound Bed Granulation Amount: Large (67-100%) Granulation Quality: Red Fascia Necrotic Amount: None Present (0%)  Fat Lay Tendon Muscle  Joint E Bone Ex dor After Cleansing: No /Fibrino No Exposed Structure Exposed: No er (Subcutaneous Tissue) Exposed: Yes Exposed: No Exposed: No xposed: No posed: No n in Area: 98.1% n in Volume: 98.1% ization: Large (67-100%) : No ng: No Treatment Notes Wound #8 (Right, Medial Amputation Site - Below Knee) 1. Cleanse With Wound Cleanser 2. Periwound Care Skin Prep 3. Primary Dressing Applied Iodoflex 4. Secondary Dressing ABD Pad Dry Gauze Roll Gauze 5. Secured With Tape Notes netting. Electronic Signature(s) Signed: 09/23/2019 3:47:19 PM By: Benjaman Kindler EMT/HBOT Signed: 09/23/2019 5:21:15 PM By: Shawn Stall Previous Signature: 09/22/2019 1:45:50 PM Version By: Shawn Stall Entered By: Benjaman Kindler on 09/23/2019 11:48:34 -------------------------------------------------------------------------------- Vitals Details Patient Name: Date of Service: Danielle Contes. 09/22/2019 12:30 PM Medical Record [id number] Patient Account Number: [id number] Date of Birth/Sex: Treating RN: [birth year] (63 y.o. Debara Pickett, Yvonne Kendall Primary Care Conrad Zajkowski: [name] Other Clinician: Referring Lashya Passe: Treating Braelynn Benning/Extender:Robson, Ivin Booty, Dellia Nims in Treatment: 23 Vital Signs Time Taken: 12:50 Temperature (F): 98.6 Height (in): 62 Pulse (bpm): 70 Weight (lbs): 190 Respiratory Rate (breaths/min): 20 Body Mass Index (BMI): 34.7 Blood Pressure (mmHg): 151/64 Capillary Blood Glucose (mg/dl): 941 Reference Range: 80 - 120 mg / dl Electronic Signature(s) Signed: 09/22/2019 1:45:50 PM By: Shawn Stall Entered By: Shawn Stall on 09/22/2019 13:00:25

## 2019-10-01 DIAGNOSIS — E1151 Type 2 diabetes mellitus with diabetic peripheral angiopathy without gangrene: Secondary | ICD-10-CM | POA: Diagnosis not present

## 2019-10-01 DIAGNOSIS — E1142 Type 2 diabetes mellitus with diabetic polyneuropathy: Secondary | ICD-10-CM | POA: Diagnosis not present

## 2019-10-01 DIAGNOSIS — I70209 Unspecified atherosclerosis of native arteries of extremities, unspecified extremity: Secondary | ICD-10-CM | POA: Diagnosis not present

## 2019-10-01 DIAGNOSIS — Z4781 Encounter for orthopedic aftercare following surgical amputation: Secondary | ICD-10-CM | POA: Diagnosis not present

## 2019-10-01 DIAGNOSIS — I2510 Atherosclerotic heart disease of native coronary artery without angina pectoris: Secondary | ICD-10-CM | POA: Diagnosis not present

## 2019-10-01 DIAGNOSIS — F17210 Nicotine dependence, cigarettes, uncomplicated: Secondary | ICD-10-CM | POA: Diagnosis not present

## 2019-10-01 DIAGNOSIS — Z4801 Encounter for change or removal of surgical wound dressing: Secondary | ICD-10-CM | POA: Diagnosis not present

## 2019-10-01 DIAGNOSIS — I10 Essential (primary) hypertension: Secondary | ICD-10-CM | POA: Diagnosis not present

## 2019-10-01 DIAGNOSIS — Z89511 Acquired absence of right leg below knee: Secondary | ICD-10-CM | POA: Diagnosis not present

## 2019-10-08 DIAGNOSIS — E1142 Type 2 diabetes mellitus with diabetic polyneuropathy: Secondary | ICD-10-CM | POA: Diagnosis not present

## 2019-10-08 DIAGNOSIS — I2510 Atherosclerotic heart disease of native coronary artery without angina pectoris: Secondary | ICD-10-CM | POA: Diagnosis not present

## 2019-10-08 DIAGNOSIS — Z4801 Encounter for change or removal of surgical wound dressing: Secondary | ICD-10-CM | POA: Diagnosis not present

## 2019-10-08 DIAGNOSIS — E1151 Type 2 diabetes mellitus with diabetic peripheral angiopathy without gangrene: Secondary | ICD-10-CM | POA: Diagnosis not present

## 2019-10-08 DIAGNOSIS — Z89511 Acquired absence of right leg below knee: Secondary | ICD-10-CM | POA: Diagnosis not present

## 2019-10-08 DIAGNOSIS — I70209 Unspecified atherosclerosis of native arteries of extremities, unspecified extremity: Secondary | ICD-10-CM | POA: Diagnosis not present

## 2019-10-08 DIAGNOSIS — Z4781 Encounter for orthopedic aftercare following surgical amputation: Secondary | ICD-10-CM | POA: Diagnosis not present

## 2019-10-08 DIAGNOSIS — F17210 Nicotine dependence, cigarettes, uncomplicated: Secondary | ICD-10-CM | POA: Diagnosis not present

## 2019-10-08 DIAGNOSIS — I10 Essential (primary) hypertension: Secondary | ICD-10-CM | POA: Diagnosis not present

## 2019-10-13 ENCOUNTER — Other Ambulatory Visit: Payer: Self-pay

## 2019-10-13 ENCOUNTER — Encounter (HOSPITAL_BASED_OUTPATIENT_CLINIC_OR_DEPARTMENT_OTHER): Payer: Medicare HMO | Attending: Internal Medicine | Admitting: Internal Medicine

## 2019-10-13 DIAGNOSIS — Z89511 Acquired absence of right leg below knee: Secondary | ICD-10-CM | POA: Diagnosis not present

## 2019-10-13 DIAGNOSIS — E1151 Type 2 diabetes mellitus with diabetic peripheral angiopathy without gangrene: Secondary | ICD-10-CM | POA: Diagnosis not present

## 2019-10-13 DIAGNOSIS — I10 Essential (primary) hypertension: Secondary | ICD-10-CM | POA: Diagnosis not present

## 2019-10-13 DIAGNOSIS — E1142 Type 2 diabetes mellitus with diabetic polyneuropathy: Secondary | ICD-10-CM | POA: Insufficient documentation

## 2019-10-13 DIAGNOSIS — F4310 Post-traumatic stress disorder, unspecified: Secondary | ICD-10-CM | POA: Diagnosis not present

## 2019-10-13 DIAGNOSIS — I2510 Atherosclerotic heart disease of native coronary artery without angina pectoris: Secondary | ICD-10-CM | POA: Insufficient documentation

## 2019-10-13 DIAGNOSIS — Z89512 Acquired absence of left leg below knee: Secondary | ICD-10-CM | POA: Insufficient documentation

## 2019-10-13 DIAGNOSIS — T8789 Other complications of amputation stump: Secondary | ICD-10-CM | POA: Diagnosis not present

## 2019-10-13 DIAGNOSIS — E785 Hyperlipidemia, unspecified: Secondary | ICD-10-CM | POA: Diagnosis not present

## 2019-10-13 DIAGNOSIS — F17210 Nicotine dependence, cigarettes, uncomplicated: Secondary | ICD-10-CM | POA: Diagnosis not present

## 2019-10-13 DIAGNOSIS — E11622 Type 2 diabetes mellitus with other skin ulcer: Secondary | ICD-10-CM | POA: Diagnosis not present

## 2019-10-13 DIAGNOSIS — L97818 Non-pressure chronic ulcer of other part of right lower leg with other specified severity: Secondary | ICD-10-CM | POA: Insufficient documentation

## 2019-10-13 NOTE — Progress Notes (Addendum)
Danielle Buchanan, Danielle Buchanan ([id number]) Visit Report for 10/13/2019 Arrival Information Details Patient Name: Date of Service: Danielle Buchanan, Danielle Buchanan 10/13/2019 12:30 PM Medical Record [id number] Patient Account Number: [id number] Date of Birth/Sex: Treating RN: 1956-03-18 (64 y.o. Tommye Standard Primary Care Deano Tomaszewski: [name] Other Clinician: Referring Jakira Mcfadden: Treating Mikell Camp/Extender:Robson, Ivin Booty, Dellia Nims in Treatment: 26 Visit Information History Since Last Visit Added or deleted any medications: No Patient Arrived: Wheel Chair Any new allergies or adverse reactions: No Arrival Time: 12:54 Had a fall or experienced change in No activities of daily living that may affect Accompanied By: nephew risk of falls: Transfer Assistance: None Signs or symptoms of abuse/neglect since last No Patient Identification Verified: Yes visito Secondary Verification Process Completed: Yes Hospitalized since last visit: No Patient Requires Transmission-Based No Implantable device outside of the clinic excluding No Precautions: cellular tissue based products placed in the center Patient Has Alerts: No since last visit: Has Dressing in Place as Prescribed: Yes Pain Present Now: No Electronic Signature(s) Signed: 10/13/2019 5:52:47 PM By: Zenaida Deed RN, BSN Entered By: Zenaida Deed on 10/13/2019 12:55:50 -------------------------------------------------------------------------------- Clinic Level of Care Assessment Details Patient Name: Date of Service: Danielle Buchanan, LAMPRON. 10/13/2019 12:30 PM Medical Record [id number] Patient Account Number: [id number] Date of Birth/Sex: Treating RN: 1956/07/17 (64 y.o. [name] Primary Care Tremain Rucinski: [name] Other Clinician: Referring Vernell Back: Treating Antonio Woodhams/Extender:Robson, Ivin Booty, Dellia Nims in Treatment: 26 Clinic Level of Care Assessment Items TOOL 4 Quantity Score X - Use when only an EandM is performed on  FOLLOW-UP visit 1 0 ASSESSMENTS - Nursing Assessment / Reassessment X - Reassessment of Co-morbidities (includes updates in patient status) 1 10 X - Reassessment of Adherence to Treatment Plan 1 5 ASSESSMENTS - Wound and Skin Assessment / Reassessment X - Simple Wound Assessment / Reassessment - one wound 1 5 []  - Complex Wound Assessment / Reassessment - multiple wounds 0 []  - Dermatologic / Skin Assessment (not related to wound area) 0 ASSESSMENTS - Focused Assessment []  - Circumferential Edema Measurements - multi extremities 0 []  - Nutritional Assessment / Counseling / Intervention 0 []  - Lower Extremity Assessment (monofilament, tuning fork, pulses) 0 []  - Peripheral Arterial Disease Assessment (using hand held doppler) 0 ASSESSMENTS - Ostomy and/or Continence Assessment and Care []  - Incontinence Assessment and Management 0 []  - Ostomy Care Assessment and Management (repouching, etc.) 0 PROCESS - Coordination of Care X - Simple Patient / Family Education for ongoing care 1 15 []  - Complex (extensive) Patient / Family Education for ongoing care 0 X - Staff obtains , Records, Test Results / Process Orders 1 10 []  - Staff telephones HHA, Nursing Homes / Clarify orders / etc 0 []  - Routine Transfer to another Facility (non-emergent condition) 0 []  - Routine Hospital Admission (non-emergent condition) 0 []  - New Admissions / / Ordering NPWT, Apligraf, etc. 0 []  - Emergency Hospital Admission (emergent condition) 0 X - Simple Discharge Coordination 1 10 []  - Complex (extensive) Discharge Coordination 0 PROCESS - Special Needs []  - Pediatric / Minor Patient Management 0 []  - Isolation Patient Management 0 []  - Hearing / Language / Visual special needs 0 []  - Assessment of Community assistance (transportation, D/C planning, etc.) 0 []  - Additional assistance / Altered mentation 0 []  - Support Surface(s) Assessment (bed, cushion, seat, etc.)  0 INTERVENTIONS - Wound Cleansing / Measurement X - Simple Wound Cleansing - one wound 1 5 []  - Complex Wound Cleansing - multiple wounds 0 X - Wound  Imaging (photographs - any number of wounds) 1 5 []  - Wound Tracing (instead of photographs) 0 X - Simple Wound Measurement - one wound 1 5 []  - Complex Wound Measurement - multiple wounds 0 INTERVENTIONS - Wound Dressings []  - Small Wound Dressing one or multiple wounds 0 []  - Medium Wound Dressing one or multiple wounds 0 []  - Large Wound Dressing one or multiple wounds 0 X - Application of Medications - topical 1 5 []  - Application of Medications - injection 0 INTERVENTIONS - Miscellaneous []  - External ear exam 0 []  - Specimen Collection (cultures, biopsies, blood, body fluids, etc.) 0 []  - Specimen(s) / Culture(s) sent or taken to Lab for analysis 0 []  - Patient Transfer (multiple staff / / Similar devices) 0 []  - Simple Staple / Suture removal (25 or less) 0 []  - Complex Staple / Suture removal (26 or more) 0 []  - Hypo / Hyperglycemic Management (close monitor of Blood Glucose) 0 []  - Ankle / Brachial Index (ABI) - do not check if billed separately 0 X - Vital Signs 1 5 Has the patient been seen at the hospital within the last three years: Yes Total Score: 80 Level Of Care: New/Established - Level 3 Electronic Signature(s) Signed: 10/13/2019 5:08:58 PM By: RN Entered By: on 10/13/2019 13:28:48 -------------------------------------------------------------------------------- Encounter Discharge Information Details Patient Name: Date of Service: . 10/13/2019 12:30 PM Medical Record Patient Account Number: Date of Birth/Sex: Treating RN: [birth year] (64 y.o. Primary Care Hadyn Blanck: Other Clinician: Referring Whittany Parish: Treating Elandra Powell/Extender:Robson, , in Treatment: 26 Encounter Discharge  Information Items Discharge Condition: Stable Ambulatory Status: Wheelchair Discharge Destination: Home Transportation: Private Auto Accompanied By: son Schedule Follow-up Appointment: Yes Clinical Summary of Care: Patient Declined Electronic Signature(s) Signed: 10/13/2019 5:08:58 PM By: Yevonne Pax RN Entered By: 12/11/2019 on 10/13/2019 13:46:20 -------------------------------------------------------------------------------- Lower Extremity Assessment Details Patient Name: Date of Service: Danielle Buchanan, Danielle Buchanan 10/13/2019 12:30 PM Medical Record [id number] Patient Account Number: 02/17/1956 Date of Birth/Sex: Treating RN: 08/23/1956 (64 y.o. [name] Primary Care Vincente Asbridge: Ivin Booty Other Clinician: Referring Eleanna Theilen: Treating Hamdi Kley/Extender:Robson, Dellia Nims, 27 in Treatment: 26 Electronic Signature(s) Signed: 10/13/2019 5:52:47 PM By: Yevonne Pax RN, BSN Entered By: Yevonne Pax on 10/13/2019 12:57:16 -------------------------------------------------------------------------------- Multi Wound Chart Details Patient Name: Date of Service: Danielle Contes. 10/13/2019 12:30 PM Medical Record [id number] Patient Account Number: [id number] Date of Birth/Sex: Treating RN: 12/09/1955 (64 y.o. F) Primary Care Lundynn Cohoon: Tommye Standard Other Clinician: Referring Marinda Tyer: Treating Nikka Hakimian/Extender:Robson, [name], Ivin Booty in Treatment: 26 Vital Signs Height(in): 62 Pulse(bpm): 70 Weight(lbs): 190 Blood Pressure(mmHg): 135/55 Body Mass Index(BMI): 35 Temperature(F): 99.1 Respiratory 18 Rate(breaths/min): Photos: [8:No Photos] [N/A:N/A] Wound Location: [8:Right Amputation Site - Below Knee - Medial] [N/A:N/A] Wounding Event: [8:Surgical Injury] [N/A:N/A] Primary Etiology: [8:Dehisced Wound] [N/A:N/A] Comorbid History: [8:Hypertension, Peripheral N/A Arterial Disease, Type II Diabetes, Neuropathy] Date Acquired: [8:04/01/2019]  [N/A:N/A] Weeks of Treatment: [8:26] [N/A:N/A] Wound Status: [8:Open] [N/A:N/A] Measurements L x W x D 0x0x0 [N/A:N/A] (cm) Area (cm) : [8:0] [N/A:N/A] Volume (cm) : [8:0] [N/A:N/A] % Reduction in Area: [8:100.00%] [N/A:N/A] % Reduction in Volume: 100.00% [N/A:N/A] Classification: [8:Full Thickness Without Exposed Support Structures] [N/A:N/A] Exudate Amount: [8:None Present] [N/A:N/A] Wound Margin: [8:Flat and Intact] [N/A:N/A] Granulation Amount: [8:None Present (0%)] [N/A:N/A] Necrotic Amount: [8:None Present (0%)] [N/A:N/A] Exposed Structures: [8:Fascia: No Fat Layer (Subcutaneous Tissue) Exposed: No Tendon: No Muscle: No Joint: No Bone: No Large (67-100%)] [  N/A:N/A N/A] Treatment Notes Electronic Signature(s) Signed: 10/13/2019 5:30:44 PM By: Linton Ham MD Entered By: Linton Ham on 10/13/2019 14:21:15 -------------------------------------------------------------------------------- Multi-Disciplinary Care Plan Details Patient Name: Date of Service: Danielle Macho. 10/13/2019 12:30 PM Medical Record [id number] Patient Account Number: [id number] Date of Birth/Sex: Treating RN: 05-03-1956 (64 y.o. Danielle Buchanan Primary Care Marko Skalski: Maurice Small Other Clinician: Referring Elias Bordner: Treating Charla Criscione/Extender:Robson, Michiel Cowboy, Judson Roch in Treatment: 26 Active Inactive Electronic Signature(s) Signed: 10/13/2019 5:08:58 PM By: [name] RN Entered By: [name] on 10/13/2019 13:26:51 -------------------------------------------------------------------------------- Pain Assessment Details Patient Name: Date of Service: Danielle Buchanan, Danielle Buchanan 10/13/2019 12:30 PM Medical Record [id number] Patient Account Number: [id number] Date of Birth/Sex: Treating RN: [birth year] (64 y.o. Danielle Buchanan Primary Care Tuvia Woodrick: Maurice Small Other Clinician: Referring Zamyah Wiesman: Treating Leahna Hewson/Extender:Robson, Michiel Cowboy, Judson Roch in Treatment: 26 Active  Problems Location of Pain Severity and Description of Pain Patient Has Paino No Site Locations Rate the pain. Current Pain Level: 0 Pain Management and Medication Current Pain Management: Electronic Signature(s) Signed: 10/13/2019 5:52:47 PM By: Baruch Gouty RN, BSN Entered By: Baruch Gouty on 10/13/2019 12:57:05 -------------------------------------------------------------------------------- Patient/Caregiver Education Details Patient Name: Date of Service: Danielle Macho 1/5/2021andnbsp12:30 PM Medical Record [id number] Patient Account Number: [id number] Date of Birth/Gender: Treating RN: Feb 25, 1956 (63 y.o. Danielle Buchanan Primary Care Physician: Maurice Small Other Clinician: Referring Physician: Treating Physician/Extender:Robson, Michiel Cowboy, Judson Roch in Treatment: 26 Education Assessment Education Provided To: Patient Education Topics Provided Pain: Methods: Explain/Verbal Responses: State content correctly Electronic Signature(s) Signed: 10/13/2019 5:08:58 PM By: [name] RN Entered By: [name] on 10/13/2019 13:26:59 -------------------------------------------------------------------------------- Wound Assessment Details Patient Name: Date of Service: Danielle Macho. 10/13/2019 12:30 PM Medical Record [id number] Patient Account Number: [id number] Date of Birth/Sex: Treating RN: 12/26/1955 (64 y.o. Danielle Buchanan, Danielle Buchanan Primary Care Crayton Savarese: Maurice Small Other Clinician: Referring Shakiyah Cirilo: Treating Adiana Smelcer/Extender:Robson, Michiel Cowboy, Judson Roch in Treatment: 26 Wound Status Wound Number: 8 Primary Dehisced Wound Etiology: Wound Location: Right Amputation Site - Below Knee - Medial Wound Healed - Epithelialized Status: Wounding Event: Surgical Injury Comorbid Hypertension, Peripheral Arterial Disease, Date Acquired: 04/01/2019 History: Type II Diabetes, Neuropathy Weeks Of Treatment: 26 Clustered Wound: No Photos Wound  Measurements Length: (cm) 0 % Reduct Width: (cm) 0 % Reduct Depth: (cm) 0 Epitheli Area: (cm) 0 Tunneli Volume: (cm) 0 Undermi Wound Description Classification: Full Thickness Without Exposed Support Foul Odo Structures Slough/F Wound Flat and Intact Margin: Exudate None Present Amount: Wound Bed Granulation Amount: None Present (0%) Necrotic Amount: None Present (0%) Fascia E Fat Laye Tendon E Muscle E Joint Ex Bone Exp r After Cleansing: No ibrino No Exposed Structure xposed: No r (Subcutaneous Tissue) Exposed: No xposed: No xposed: No posed: No osed: No ion in Area: 100% ion in Volume: 100% alization: Large (67-100%) ng: No ning: No Electronic Signature(s) Signed: 10/14/2019 3:43:47 PM By: Danielle Buchanan EMT/HBOT Signed: 10/14/2019 6:06:10 PM By: Baruch Gouty RN, BSN Previous Signature: 10/13/2019 5:52:47 PM Version By: Baruch Gouty RN, BSN Entered By: Danielle Buchanan on 10/14/2019 09:46:11 -------------------------------------------------------------------------------- Vitals Details Patient Name: Date of Service: Danielle Macho. 10/13/2019 12:30 PM Medical Record [id number] Patient Account Number: [id number] Date of Birth/Sex: Treating RN: [birth year] (64 y.o. Danielle Buchanan Primary Care Kadarrius Yanke: Maurice Small Other Clinician: Referring Taneeka Curtner: Treating Brianna Bennett/Extender:Robson, Michiel Cowboy, Judson Roch in Treatment: 26 Vital Signs Time Taken: 12:55 Temperature (F): 99.1 Height (in): 62 Pulse (bpm): 70 Source: Stated Respiratory Rate (breaths/min): 18 Weight (lbs): 190 Blood Pressure (mmHg): 135/55 Source: Stated Reference  Range: 80 - 120 mg / dl Body Mass Index (BMI): 34.7 Notes pt did not check blood sugar this am Electronic Signature(s) Signed: 10/13/2019 5:52:47 PM By: Zenaida Deed RN, BSN Entered By: Zenaida Deed on 10/13/2019 12:56:55

## 2019-10-13 NOTE — Progress Notes (Signed)
WILLODENE, STALLINGS ([id number]) Visit Report for 10/13/2019 HPI Details Patient Name: Date of Service: Danielle Buchanan, Danielle Buchanan 10/13/2019 12:30 PM Medical Record [id number] Patient Account Number: [id number] Date of Birth/Sex: Treating RN: 06-Nov-1955 (64 y.o. F) Primary Care Provider: [name] Other Clinician: Referring Provider: Treating Provider/Extender:Albert Hersch, Ivin Booty, Dellia Nims in Treatment: 26 History of Present Illness Location: right foot Quality: Patient reports experiencing a dull pain to affected area(s). Severity: Patient states wound(s) are getting worse. Duration: Patient has had the wound for > 3 months prior to seeking treatment at the wound center Timing: Pain in wound is constant (hurts all the time) Context: the original wound on the right foot is the same and she's recently had a left below-knee amputation Modifying Factors: Patient wound(s)/ulcer(s) are worsening due to :significant pain at rest HPI Description: 64 year old diabetic patient with a past medical history of essential hypertension, hyper lipidemia, PTSD, was seen recently and a walk-in clinic a week ago for pain and ulcers on the feet which she's had for 2 months. The diagnosis of foot cellulitis was made and the patient was put on Bactrim DS for 14 days along with Neurontin and pain medication and asked to see the wound center. The patient has been seen by the endocrinologist [name] and noted that she has had diabetes since 1992 and has polyneuropathy and peripheral arterial disease.She is a smoker and smokes about a pack of cigarettes a day. She was recently referred to a dermatologist. Most recently she had a lower arterial duplex study done which showed the patient had no evidence of significant right or left lower extremity arterial disease based on the ABI which was 1.09 on the right and 1.01 on the left with triphasic flow except the left anterior tibial which suggest arterial  occlusive disease. The bilateral toe brachial indices were abnormal with the right being 0.23 and the left being 0.24. the patient also had an arterial duplex examination which showed plaque bilaterally in the common femoral artery and the proximal superficial femoral artery and also in the popliteal artery and the right anterior tibial artery and the left posterior tibial artery. These tests were requested by her primary care physician Dr. [name], but the patient and her caregiver who was her nephew say they did not receive any call back regarding the test nor have they been told to go to a vascular surgeon. 06/19/2017 -- was seen by [name] who has scheduled her for a aortogram and runoff on 06/24/2017-- he suspects this is going to be microvascular disease in addition to her diabetic ulcers. The benefit from an angiogram may be just as a baseline study to see if there is any pedal work that can be done to help improve the blood flow. He also discussed that she may lose her toes or have more proximal amputation of her limbs. She was recently admitted to the hospital between 06/09/2017 and 06/11/2017 with uncontrolled diabetes mellitus type 2, hypertension, nicotine dependence, worsening leg leg pain. She was treated appropriately for cellulitis she was sent home on Clindamycin 300 mg 3 times a day. Left foot x-ray done during this admission showed no radiographic evidence of osteomyelitis. Last hemoglobin A1c was 11.6% She has given up smoking for the last week. 06/26/2017 -- she was seen by [name] recently and an angiogram was done and found that she does not have any options for reconstruction but does have flow bilaterally to the ankles. He was hopeful that with wound  care and smoking sensation she could heal her wounds. Procedure performed on 06/24/2017 was a aortogram with bilateral lower extremity runoff. The findings were that of a patent aortobiiliac segments with a 30%  stenosis of the left SFA and otherwise patent runoff to the popliteal arteries bilaterally. Predominant runoff on the right is via the peroneal artery with not much flow to the right foot. On the left side dominant runoff is via the posterior tibial artery and there is also peroneal artery. There is no appreciable plantar arch on the left either. No intervention was undertaken. 07/17/2017 -- the initial paperwork to [facility] has denied her treatment for hyperbaric oxygen therapy. We will re- file the request. In the meanwhile she is scheduled to see [name] on Friday for further discussions regarding amputation of her left forefoot. she was in the ER on 07/12/2017 and I have reviewed these notes including the workup and the treatment plan and I agree with this. 08/21/2017 -- the patient was in and out of the hospital after a revision surgery on 08/12/2017 where she underwent a revision left below-knee amputation after she had a fall and distracted the previous amputation site. Her first BKA on that side was done on 07/23/2017. Further amount of necrotic tissue and bone was resected and primary closure was performed. she was discharged home on 08/15/2017 the patient is been using Santyl ointment on the right foot and the left below-knee amputation site is under the care of her surgeon [name]. She says her blood glucose control has been good and she still not smoking READMISSION 04/09/2019 This is a now 64 year old woman who we cared for in this clinic in 2018 predominantly with a left foot wound. She ultimately had a left BKA and a left U KA. More recently she developed wounds on her feet on the right. She underwent a right BKA on 01/19/2019 for gangrene of the right foot. Her nephew is present tells me that the staples were removed about 2 weeks after that and she had Steri-Strips placed. They noted some drainage under the Steri- Strips and some black discoloration. This reopened with a  fairly large wound on the medial aspect of the stump incision and a smaller eschar laterally. Looking through the notes there may have been a stump infection in April as well. They have been using wet-to-dry dressings to this area twice daily. They are referred here by vein and vascular Dr. Darrick Penna for our review. Past medical history includes type 2 diabetes with a history of polyneuropathy and PAD. Last hemoglobin A1c at 8.5. Hypertension hyperlipidemia, coronary artery disease, continued tobacco abuse at 1/2 pack/day and some degree of memory decline is listed on her problem list Not possible to do ABIs of course however it is listed that it 1.2 years ago the angiogram suggest that there was enough blood flow down to the level of the ankle. She has had a angioplasty of her right posterior tibial artery and the right tibioperoneal trunk in February 2020 and then a right transmetatarsal amputation in February 2020 before the below-knee amputation. I do not see specifically the angiogram which I will need to look up at a later time. Social history; patient lives alone although she has help from family for her dressings. She has a continued half a pack per day smoker 7/16; difficult wound on the right BKA stump. There is a large wound anteriorly and laterally a small satellite area. We have been using Santyl. She tells me she  is got her cigarette smoking down to 4 cigarettes a day 7/23; difficult wounds on the right BKA stump. There is a large wound anteriorly and medially and a smaller area laterally. We have been using Santyl. Family is changing this daily. Still smoking 4 cigarettes a day. 7/30; no real improvement in the area on the right BKA stump. There is a large wound anterior and medially and a much smaller wound laterally. Both of these have necrotic surface is certainly not viable over the large wound. Been using Santyl for several weeks without real improvement. Changed to Iodoflex  today 8/13-Is back at 2 weeks, the wound of the right BKA stump looks about the same perhaps marginally better, with some granulation tissue formation, we are using Iodoflex. The much smaller lateral wound is healed 9/3; the right BKA stump wound is slightly smaller. Surface quite a bit better. She has palpable popliteal pulses. We have been using Iodoflex. Her nephew to helps her with the dressings and washing of the wound I think they have made some progress 9/22; the right BKA stump is slightly smaller. Probably 80% of this as healthy granulation now. Debrided with Anasept and gauze no mechanical debridement. We have been ordering Iodoflex but apparently getting [facility] who apparently do not have the [place] Negron Torres 10/6; right BKA stump 2-week follow-up. We have been using Iodoflex but [facility] is providing an iodine-based polymer foam dressing called Iodoplex. These may not be comparable. They got a very large supply of this at home. 10/20; right BKA stump 2-week follow-up. Using Iodoplex at home. Changing this every second day. The wound is making nice progress measures smaller today 11/3; right BKA stump. 2-week follow-up. Using Iodoflex at home. For the first time recently a completely nonviable surface which was difficult to explain. No evidence of infection. Required debridement in spite of this reasonably bad news the surface area of the wound is better 11/17; right BKA stump. Using Iodoflex. Much better appearance this week required mechanical debridement last week none done today 12/1; right BKA stump. 2-week follow-up. We have been using Iodoflex. She comes in today with a much better looking wound surface and a smaller surface area 12/15; right BKA stump. 2-week follow-up. We have been using Iodoflex wound is smaller and healthy looking 10/13/2019. Right BKA stump. 2-week follow-up this is healed. Has been healed for about a week and a half per  her nephew who accompanies her. Her goal is to walk with an AKA prosthesis on the left and a BKA prosthesis on the right. She has not yet been measured for a prosthesis. I have cautioned her that I think she should lay off considering any weightbearing on the right stump for at least another 5 weeks however that may be enough time for her to get measured Electronic Signature(s) Signed: 10/13/2019 5:30:44 PM By: Linton Ham MD Entered By: Linton Ham on 10/13/2019 14:23:14 -------------------------------------------------------------------------------- Physical Exam Details Patient Name: Date of Service: Franky Macho. 10/13/2019 12:30 PM Medical Record [id number] Patient Account Number: [id number] Date of Birth/Sex: Treating RN: 11/26/1955 (64 y.o. F) Primary Care Provider: Maurice Small Other Clinician: Referring Provider: Treating Provider/Extender:Jalisia Puchalski, Michiel Cowboy, Judson Roch in Treatment: 26 Constitutional Sitting or standing Blood Pressure is within target range for patient.. Pulse regular and within target range for patient.Marland Kitchen Respirations regular, non-labored and within target range.. Temperature is normal and within the target range for the patient.Marland Kitchen Appears in no distress. Respiratory work of breathing is normal.  Cardiovascular Popliteal pulses faintly palpable. Notes Wound exam; right BKA stump. Everything is healed here. Tissue looks very healthy. The stump is warm. Electronic Signature(s) Signed: 10/13/2019 5:30:44 PM By: Baltazar Najjarobson, Jazon Jipson MD Entered By: Baltazar Najjarobson, Jaece Ducharme on 10/13/2019 14:23:56 -------------------------------------------------------------------------------- Physician Orders Details Patient Name: Date of Service: Danielle Buchanan, Danielle M. 10/13/2019 12:30 PM Medical Record [id number] Patient Account Number: [id number] Date of Birth/Sex: Treating RN: Jul 04, 1956 (64 y.o. Danielle Buchanan) Epps, Carrie Primary Care Provider: Shirlean MylarWebb, Carol Other  Clinician: Referring Provider: Treating Provider/Extender:Abbegayle Denault, Ivin BootyMichael Webb, Dellia Nimsarol Weeks in Treatment: 26 Verbal / Phone Orders: No Diagnosis Coding ICD-10 Coding Code Description E11.51 Type 2 diabetes mellitus with diabetic peripheral angiopathy without gangrene E11.622 Type 2 diabetes mellitus with other skin ulcer L97.818 Non-pressure chronic ulcer of other part of right lower leg with other specified severity Z89.511 Acquired absence of right leg below knee Z89.612 Acquired absence of left leg above knee [facility] - may wear stump shrinker Home Health Discontinue home health - advance Electronic Signature(s) Signed: 10/13/2019 5:08:58 PM By: Yevonne PaxEpps, Carrie RN Signed: 10/13/2019 5:30:44 PM By: Baltazar Najjarobson, Adalyn Pennock MD Entered By: Yevonne PaxEpps, Carrie on 10/13/2019 13:27:54 -------------------------------------------------------------------------------- Problem List Details Patient Name: Date of Service: Danielle Buchanan, Danielle M. 10/13/2019 12:30 PM Medical Record [id number] Patient Account Number: [id number] Date of Birth/Sex: Treating RN: Jul 04, 1956 (64 y.o. Danielle Buchanan) Epps, Carrie Primary Care Provider: Shirlean MylarWebb, Carol Other Clinician: Referring Provider: Treating Provider/Extender:Toua Stites, Ivin BootyMichael Webb, Dellia Nimsarol Weeks in Treatment: 26 Active Problems ICD-10 Evaluated Encounter Code Description Active Date Today Diagnosis E11.51 Type 2 diabetes mellitus with diabetic peripheral 04/09/2019 No Yes angiopathy without gangrene E11.622 Type 2 diabetes mellitus with other skin ulcer 04/09/2019 No Yes L97.818 Non-pressure chronic ulcer of other part of right lower 04/09/2019 No Yes leg with other specified severity Z89.511 Acquired absence of right leg below knee 04/09/2019 No Yes Z89.612 Acquired absence of left leg above knee 04/09/2019 No Yes Inactive Problems Resolved Problems Electronic Signature(s) Signed: 10/13/2019 5:30:44 PM By: Baltazar Najjarobson, Rinda Rollyson MD Entered  By: Baltazar Najjarobson, Keirstan Iannello on 10/13/2019 14:21:07 -------------------------------------------------------------------------------- Progress Note Details Patient Name: Date of Service: Danielle Buchanan, Danielle M. 10/13/2019 12:30 PM Medical Record [id number] Patient Account Number: [id number] Date of Birth/Sex: Treating RN: Jul 04, 1956 (64 y.o. F) Primary Care Provider: Shirlean MylarWebb, Carol Other Clinician: Referring Provider: Treating Provider/Extender:Makalya Nave, Ivin BootyMichael Webb, Dellia Nimsarol Weeks in Treatment: 26 Subjective History of Present Illness (HPI) The following HPI elements were documented for the patient's wound: Location: right foot Quality: Patient reports experiencing a dull pain to affected area(s). Severity: Patient states wound(s) are getting worse. Duration: Patient has had the wound for > 3 months prior to seeking treatment at the wound center Timing: Pain in wound is constant (hurts all the time) Context: the original wound on the right foot is the same and she's recently had a left below-knee amputation Modifying Factors: Patient wound(s)/ulcer(s) are worsening due to :significant pain at rest 64 year old diabetic patient with a past medical history of essential hypertension, hyper lipidemia, PTSD, was seen recently and a walk-in clinic a week ago for pain and ulcers on the feet which she's had for 2 months. The diagnosis of foot cellulitis was made and the patient was put on Bactrim DS for 14 days along with Neurontin and pain medication and asked to see the wound center. The patient has been seen by the endocrinologist Dr. Romero BellingSean Ellison and noted that she has had diabetes since 1992 and has polyneuropathy and peripheral arterial disease.She is a smoker and smokes about a pack of cigarettes a  day. She was recently referred to a dermatologist. Most recently she had a lower arterial duplex study done which showed the patient had no evidence of significant right or left lower extremity arterial disease  based on the ABI which was 1.09 on the right and 1.01 on the left with triphasic flow except the left anterior tibial which suggest arterial occlusive disease. The bilateral toe brachial indices were abnormal with the right being 0.23 and the left being 0.24. the patient also had an arterial duplex examination which showed plaque bilaterally in the common femoral artery and the proximal superficial femoral artery and also in the popliteal artery and the right anterior tibial artery and the left posterior tibial artery. These tests were requested by her primary care physician Dr. [name], but the patient and her caregiver who was her nephew say they did not receive any call back regarding the test nor have they been told to go to a vascular surgeon. 06/19/2017 -- was seen by [name] who has scheduled her for a aortogram and runoff on 06/24/2017-- he suspects this is going to be microvascular disease in addition to her diabetic ulcers. The benefit from an angiogram may be just as a baseline study to see if there is any pedal work that can be done to help improve the blood flow. He also discussed that she may lose her toes or have more proximal amputation of her limbs. She was recently admitted to the hospital between 06/09/2017 and 06/11/2017 with uncontrolled diabetes mellitus type 2, hypertension, nicotine dependence, worsening leg leg pain. She was treated appropriately for cellulitis she was sent home on Clindamycin 300 mg 3 times a day. Left foot x-ray done during this admission showed no radiographic evidence of osteomyelitis. Last hemoglobin A1c was 11.6% She has given up smoking for the last week. 06/26/2017 -- she was seen by [name] recently and an angiogram was done and found that she does not have any options for reconstruction but does have flow bilaterally to the ankles. He was hopeful that with wound care and smoking sensation she could heal her wounds. Procedure  performed on 06/24/2017 was a aortogram with bilateral lower extremity runoff. The findings were that of a patent aortobiiliac segments with a 30% stenosis of the left SFA and otherwise patent runoff to the popliteal arteries bilaterally. Predominant runoff on the right is via the peroneal artery with not much flow to the right foot. On the left side dominant runoff is via the posterior tibial artery and there is also peroneal artery. There is no appreciable plantar arch on the left either. No intervention was undertaken. 07/17/2017 -- the initial paperwork to [facility] has denied her treatment for hyperbaric oxygen therapy. We will re- file the request. In the meanwhile she is scheduled to see [name] on Friday for further discussions regarding amputation of her left forefoot. she was in the ER on 07/12/2017 and I have reviewed these notes including the workup and the treatment plan and I agree with this. 08/21/2017 -- the patient was in and out of the hospital after a revision surgery on 08/12/2017 where she underwent a revision left below-knee amputation after she had a fall and distracted the previous amputation site. Her first BKA on that side was done on 07/23/2017. Further amount of necrotic tissue and bone was resected and primary closure was performed. she was discharged home on 08/15/2017 the patient is been using Santyl ointment on the right foot and the left below-knee  amputation site is under the care of her surgeon [name]. She says her blood glucose control has been good and she still not smoking READMISSION 04/09/2019 This is a now 64 year old woman who we cared for in this clinic in 2018 predominantly with a left foot wound. She ultimately had a left BKA and a left U KA. More recently she developed wounds on her feet on the right. She underwent a right BKA on 01/19/2019 for gangrene of the right foot. Her nephew is present tells me that the staples were removed about 2  weeks after that and she had Steri-Strips placed. They noted some drainage under the Steri- Strips and some black discoloration. This reopened with a fairly large wound on the medial aspect of the stump incision and a smaller eschar laterally. Looking through the notes there may have been a stump infection in April as well. They have been using wet-to-dry dressings to this area twice daily. They are referred here by vein and vascular Dr. Darrick Penna for our review. Past medical history includes type 2 diabetes with a history of polyneuropathy and PAD. Last hemoglobin A1c at 8.5. Hypertension hyperlipidemia, coronary artery disease, continued tobacco abuse at 1/2 pack/day and some degree of memory decline is listed on her problem list Not possible to do ABIs of course however it is listed that it 1.2 years ago the angiogram suggest that there was enough blood flow down to the level of the ankle. She has had a angioplasty of her right posterior tibial artery and the right tibioperoneal trunk in February 2020 and then a right transmetatarsal amputation in February 2020 before the below-knee amputation. I do not see specifically the angiogram which I will need to look up at a later time. Social history; patient lives alone although she has help from family for her dressings. She has a continued half a pack per day smoker 7/16; difficult wound on the right BKA stump. There is a large wound anteriorly and laterally a small satellite area. We have been using Santyl. She tells me she is got her cigarette smoking down to 4 cigarettes a day 7/23; difficult wounds on the right BKA stump. There is a large wound anteriorly and medially and a smaller area laterally. We have been using Santyl. Family is changing this daily. Still smoking 4 cigarettes a day. 7/30; no real improvement in the area on the right BKA stump. There is a large wound anterior and medially and a much smaller wound laterally. Both of these have  necrotic surface is certainly not viable over the large wound. Been using Santyl for several weeks without real improvement. Changed to Iodoflex today 8/13-Is back at 2 weeks, the wound of the right BKA stump looks about the same perhaps marginally better, with some granulation tissue formation, we are using Iodoflex. The much smaller lateral wound is healed 9/3; the right BKA stump wound is slightly smaller. Surface quite a bit better. She has palpable popliteal pulses. We have been using Iodoflex. Her nephew to helps her with the dressings and washing of the wound I think they have made some progress 9/22; the right BKA stump is slightly smaller. Probably 80% of this as healthy granulation now. Debrided with Anasept and gauze no mechanical debridement. We have been ordering Iodoflex but apparently getting [facility] who apparently do not have the Iodoflex 10/6; right BKA stump 2-week follow-up. We have been using Iodoflex but [facility] is providing an iodine-based polymer  foam dressing called Iodoplex. These may not be comparable. They got a very large supply of this at home. 10/20; right BKA stump 2-week follow-up. Using Iodoplex at home. Changing this every second day. The wound is making nice progress measures smaller today 11/3; right BKA stump. 2-week follow-up. Using Iodoflex at home. For the first time recently a completely nonviable surface which was difficult to explain. No evidence of infection. Required debridement in spite of this reasonably bad news the surface area of the wound is better 11/17; right BKA stump. Using Iodoflex. Much better appearance this week required mechanical debridement last week none done today 12/1; right BKA stump. 2-week follow-up. We have been using Iodoflex. She comes in today with a much better looking wound surface and a smaller surface area 12/15; right BKA stump. 2-week follow-up. We have been using Iodoflex  wound is smaller and healthy looking 10/13/2019. Right BKA stump. 2-week follow-up this is healed. Has been healed for about a week and a half per her nephew who accompanies her. Her goal is to walk with an AKA prosthesis on the left and a BKA prosthesis on the right. She has not yet been measured for a prosthesis. I have cautioned her that I think she should lay off considering any weightbearing on the right stump for at least another 5 weeks however that may be enough time for her to get measured Objective Constitutional Sitting or standing Blood Pressure is within target range for patient.. Pulse regular and within target range for patient.Marland Kitchen Respirations regular, non-labored and within target range.. Temperature is normal and within the target range for the patient.Marland Kitchen Appears in no distress. Vitals Time Taken: 12:55 PM, Height: 62 in, Source: Stated, Weight: 190 lbs, Source: Stated, BMI: 34.7, Temperature: 99.1 F, Pulse: 70 bpm, Respiratory Rate: 18 breaths/min, Blood Pressure: 135/55 mmHg. General Notes: pt did not check blood sugar this am Respiratory work of breathing is normal. Cardiovascular Popliteal pulses faintly palpable. General Notes: Wound exam; right BKA stump. Everything is healed here. Tissue looks very healthy. The stump is warm. Integumentary (Hair, Skin) Wound #8 status is Open. Original cause of wound was Surgical Injury. The wound is located on the Right,Medial Amputation Site - Below Knee. The wound measures 0cm length x 0cm width x 0cm depth; 0cm^2 area and 0cm^3 volume. There is no tunneling or undermining noted. There is a none present amount of drainage noted. The wound margin is flat and intact. There is no granulation within the wound bed. There is no necrotic tissue within the wound bed. 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 The patient's right BKA stump is healed. Fortunately at least for the foreseeable future avoided a recommended right AKA. I have advised at least 4 to 6 weeks of nonpressure on the stump to continue. This will allow her to get measured for her prosthesis. 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 Whether this stump will stand this type of bilateral prosthetic ambulation I am unclear however clearly the patient has the opportunity to go forward with this. 2. The patient can be discharged from the clinic Electronic  Signature(s) Signed: 10/13/2019 5:30:44 PM By: Baltazar Najjarobson, Jolon Degante MD Entered By: Baltazar Najjarobson, Zandra Lajeunesse on 10/13/2019 14:28:01 -------------------------------------------------------------------------------- SuperBill Details Patient Name: Date of Service: Danielle Buchanan, Danielle M. 10/13/2019 Medical Record [id number] Patient Account Number: [id number] Date of Birth/Sex: Treating RN: 10-May-1956 (64 y.o. Danielle Buchanan) Epps, Carrie Primary Care Provider: Shirlean MylarWebb, Carol Other Clinician: Referring Provider: Treating Provider/Extender:Juliett Eastburn, Ivin BootyMichael Webb, Dellia Nimsarol Weeks in Treatment: 26 Diagnosis Coding ICD-10 Codes Code Description E11.51 Type 2 diabetes mellitus with diabetic peripheral angiopathy without gangrene E11.622 Type 2 diabetes mellitus with other skin ulcer L97.818 Non-pressure chronic ulcer of other part of right lower leg with other specified severity Z89.511 Acquired absence of right leg below knee Z89.612 Acquired absence of left leg above knee Facility Procedures The patient participates with Medicare or their insurance follows the Medicare Facility Guidelines: CPT4 Code Description Modifier  Quantity [phone] - WOUND CARE VISIT-LEV 3 EST PT 1 Physician Procedures CPT4 Code Description: [id number] - WC PHYS LEVEL 3 - EST PT ICD-10 Diagnosis Description L97.818 Non-pressure chronic ulcer of other part of right lower leg w severity Z89.511 Acquired absence of right leg below knee E11.622 Type 2 diabetes mellitus  with other skin ulcer Modifier: ith other specified Quantity: 1 Electronic Signature(s) Signed: 10/13/2019 5:30:44 PM By: Baltazar Najjarobson, Doy Taaffe MD Entered By: Baltazar Najjarobson, Shelle Galdamez on 10/13/2019 14:28:26

## 2019-10-15 DIAGNOSIS — I2510 Atherosclerotic heart disease of native coronary artery without angina pectoris: Secondary | ICD-10-CM | POA: Diagnosis not present

## 2019-10-15 DIAGNOSIS — E1142 Type 2 diabetes mellitus with diabetic polyneuropathy: Secondary | ICD-10-CM | POA: Diagnosis not present

## 2019-10-15 DIAGNOSIS — E1151 Type 2 diabetes mellitus with diabetic peripheral angiopathy without gangrene: Secondary | ICD-10-CM | POA: Diagnosis not present

## 2019-10-15 DIAGNOSIS — Z4801 Encounter for change or removal of surgical wound dressing: Secondary | ICD-10-CM | POA: Diagnosis not present

## 2019-10-15 DIAGNOSIS — Z4781 Encounter for orthopedic aftercare following surgical amputation: Secondary | ICD-10-CM | POA: Diagnosis not present

## 2019-10-15 DIAGNOSIS — I10 Essential (primary) hypertension: Secondary | ICD-10-CM | POA: Diagnosis not present

## 2019-10-15 DIAGNOSIS — I70209 Unspecified atherosclerosis of native arteries of extremities, unspecified extremity: Secondary | ICD-10-CM | POA: Diagnosis not present

## 2019-10-15 DIAGNOSIS — F17210 Nicotine dependence, cigarettes, uncomplicated: Secondary | ICD-10-CM | POA: Diagnosis not present

## 2019-10-15 DIAGNOSIS — Z89511 Acquired absence of right leg below knee: Secondary | ICD-10-CM | POA: Diagnosis not present

## 2019-10-28 DIAGNOSIS — E1121 Type 2 diabetes mellitus with diabetic nephropathy: Secondary | ICD-10-CM | POA: Diagnosis not present

## 2019-10-28 DIAGNOSIS — I10 Essential (primary) hypertension: Secondary | ICD-10-CM | POA: Diagnosis not present

## 2019-10-28 DIAGNOSIS — E782 Mixed hyperlipidemia: Secondary | ICD-10-CM | POA: Diagnosis not present

## 2019-10-28 DIAGNOSIS — Z0000 Encounter for general adult medical examination without abnormal findings: Secondary | ICD-10-CM | POA: Diagnosis not present

## 2019-10-28 DIAGNOSIS — Z89619 Acquired absence of unspecified leg above knee: Secondary | ICD-10-CM | POA: Diagnosis not present

## 2019-10-28 DIAGNOSIS — I739 Peripheral vascular disease, unspecified: Secondary | ICD-10-CM | POA: Diagnosis not present

## 2019-10-28 DIAGNOSIS — F331 Major depressive disorder, recurrent, moderate: Secondary | ICD-10-CM | POA: Diagnosis not present

## 2019-10-28 DIAGNOSIS — Z794 Long term (current) use of insulin: Secondary | ICD-10-CM | POA: Diagnosis not present

## 2019-10-28 DIAGNOSIS — Z1211 Encounter for screening for malignant neoplasm of colon: Secondary | ICD-10-CM | POA: Diagnosis not present

## 2019-10-30 DIAGNOSIS — E782 Mixed hyperlipidemia: Secondary | ICD-10-CM | POA: Diagnosis not present

## 2019-10-30 DIAGNOSIS — E1121 Type 2 diabetes mellitus with diabetic nephropathy: Secondary | ICD-10-CM | POA: Diagnosis not present

## 2019-10-30 DIAGNOSIS — I10 Essential (primary) hypertension: Secondary | ICD-10-CM | POA: Diagnosis not present

## 2019-11-02 DIAGNOSIS — E1121 Type 2 diabetes mellitus with diabetic nephropathy: Secondary | ICD-10-CM | POA: Diagnosis not present

## 2019-11-04 DIAGNOSIS — R350 Frequency of micturition: Secondary | ICD-10-CM | POA: Diagnosis not present

## 2019-11-04 DIAGNOSIS — N3941 Urge incontinence: Secondary | ICD-10-CM | POA: Diagnosis not present

## 2020-01-18 DIAGNOSIS — I10 Essential (primary) hypertension: Secondary | ICD-10-CM | POA: Diagnosis not present

## 2020-01-18 DIAGNOSIS — Z7982 Long term (current) use of aspirin: Secondary | ICD-10-CM | POA: Diagnosis not present

## 2020-01-18 DIAGNOSIS — Z4781 Encounter for orthopedic aftercare following surgical amputation: Secondary | ICD-10-CM | POA: Diagnosis not present

## 2020-01-18 DIAGNOSIS — Z794 Long term (current) use of insulin: Secondary | ICD-10-CM | POA: Diagnosis not present

## 2020-01-18 DIAGNOSIS — E1140 Type 2 diabetes mellitus with diabetic neuropathy, unspecified: Secondary | ICD-10-CM | POA: Diagnosis not present

## 2020-01-18 DIAGNOSIS — G4700 Insomnia, unspecified: Secondary | ICD-10-CM | POA: Diagnosis not present

## 2020-01-18 DIAGNOSIS — F329 Major depressive disorder, single episode, unspecified: Secondary | ICD-10-CM | POA: Diagnosis not present

## 2020-01-18 DIAGNOSIS — F4310 Post-traumatic stress disorder, unspecified: Secondary | ICD-10-CM | POA: Diagnosis not present

## 2020-01-18 DIAGNOSIS — E785 Hyperlipidemia, unspecified: Secondary | ICD-10-CM | POA: Diagnosis not present

## 2020-01-20 DIAGNOSIS — I10 Essential (primary) hypertension: Secondary | ICD-10-CM | POA: Diagnosis not present

## 2020-01-20 DIAGNOSIS — F4310 Post-traumatic stress disorder, unspecified: Secondary | ICD-10-CM | POA: Diagnosis not present

## 2020-01-20 DIAGNOSIS — Z4781 Encounter for orthopedic aftercare following surgical amputation: Secondary | ICD-10-CM | POA: Diagnosis not present

## 2020-01-20 DIAGNOSIS — G4700 Insomnia, unspecified: Secondary | ICD-10-CM | POA: Diagnosis not present

## 2020-01-20 DIAGNOSIS — F329 Major depressive disorder, single episode, unspecified: Secondary | ICD-10-CM | POA: Diagnosis not present

## 2020-01-20 DIAGNOSIS — E1140 Type 2 diabetes mellitus with diabetic neuropathy, unspecified: Secondary | ICD-10-CM | POA: Diagnosis not present

## 2020-01-20 DIAGNOSIS — Z7982 Long term (current) use of aspirin: Secondary | ICD-10-CM | POA: Diagnosis not present

## 2020-01-20 DIAGNOSIS — Z794 Long term (current) use of insulin: Secondary | ICD-10-CM | POA: Diagnosis not present

## 2020-01-20 DIAGNOSIS — E785 Hyperlipidemia, unspecified: Secondary | ICD-10-CM | POA: Diagnosis not present

## 2020-01-21 DIAGNOSIS — Z89619 Acquired absence of unspecified leg above knee: Secondary | ICD-10-CM | POA: Diagnosis not present

## 2020-01-21 DIAGNOSIS — I739 Peripheral vascular disease, unspecified: Secondary | ICD-10-CM | POA: Diagnosis not present

## 2020-01-21 DIAGNOSIS — Z993 Dependence on wheelchair: Secondary | ICD-10-CM | POA: Diagnosis not present

## 2020-01-25 DIAGNOSIS — E1140 Type 2 diabetes mellitus with diabetic neuropathy, unspecified: Secondary | ICD-10-CM | POA: Diagnosis not present

## 2020-01-25 DIAGNOSIS — Z794 Long term (current) use of insulin: Secondary | ICD-10-CM | POA: Diagnosis not present

## 2020-01-25 DIAGNOSIS — G4700 Insomnia, unspecified: Secondary | ICD-10-CM | POA: Diagnosis not present

## 2020-01-25 DIAGNOSIS — I10 Essential (primary) hypertension: Secondary | ICD-10-CM | POA: Diagnosis not present

## 2020-01-25 DIAGNOSIS — F4310 Post-traumatic stress disorder, unspecified: Secondary | ICD-10-CM | POA: Diagnosis not present

## 2020-01-25 DIAGNOSIS — Z89511 Acquired absence of right leg below knee: Secondary | ICD-10-CM | POA: Diagnosis not present

## 2020-01-25 DIAGNOSIS — Z7982 Long term (current) use of aspirin: Secondary | ICD-10-CM | POA: Diagnosis not present

## 2020-01-25 DIAGNOSIS — F329 Major depressive disorder, single episode, unspecified: Secondary | ICD-10-CM | POA: Diagnosis not present

## 2020-01-25 DIAGNOSIS — E785 Hyperlipidemia, unspecified: Secondary | ICD-10-CM | POA: Diagnosis not present

## 2020-01-25 DIAGNOSIS — Z4781 Encounter for orthopedic aftercare following surgical amputation: Secondary | ICD-10-CM | POA: Diagnosis not present

## 2020-01-26 DIAGNOSIS — F4310 Post-traumatic stress disorder, unspecified: Secondary | ICD-10-CM | POA: Diagnosis not present

## 2020-01-26 DIAGNOSIS — Z7982 Long term (current) use of aspirin: Secondary | ICD-10-CM | POA: Diagnosis not present

## 2020-01-26 DIAGNOSIS — E1140 Type 2 diabetes mellitus with diabetic neuropathy, unspecified: Secondary | ICD-10-CM | POA: Diagnosis not present

## 2020-01-26 DIAGNOSIS — I10 Essential (primary) hypertension: Secondary | ICD-10-CM | POA: Diagnosis not present

## 2020-01-26 DIAGNOSIS — Z794 Long term (current) use of insulin: Secondary | ICD-10-CM | POA: Diagnosis not present

## 2020-01-26 DIAGNOSIS — G4700 Insomnia, unspecified: Secondary | ICD-10-CM | POA: Diagnosis not present

## 2020-01-26 DIAGNOSIS — Z4781 Encounter for orthopedic aftercare following surgical amputation: Secondary | ICD-10-CM | POA: Diagnosis not present

## 2020-01-26 DIAGNOSIS — E785 Hyperlipidemia, unspecified: Secondary | ICD-10-CM | POA: Diagnosis not present

## 2020-01-26 DIAGNOSIS — F329 Major depressive disorder, single episode, unspecified: Secondary | ICD-10-CM | POA: Diagnosis not present

## 2020-01-27 ENCOUNTER — Other Ambulatory Visit: Payer: Self-pay | Admitting: Family Medicine

## 2020-01-27 DIAGNOSIS — N6310 Unspecified lump in the right breast, unspecified quadrant: Secondary | ICD-10-CM

## 2020-01-29 ENCOUNTER — Encounter (HOSPITAL_COMMUNITY): Payer: Self-pay | Admitting: Radiology

## 2020-01-29 ENCOUNTER — Other Ambulatory Visit: Payer: Self-pay

## 2020-01-29 ENCOUNTER — Emergency Department (HOSPITAL_COMMUNITY): Payer: Medicare HMO

## 2020-01-29 ENCOUNTER — Emergency Department (HOSPITAL_COMMUNITY)
Admission: EM | Admit: 2020-01-29 | Discharge: 2020-01-29 | Disposition: A | Discharge status: Home / Self Care | Payer: Medicare HMO | Attending: Emergency Medicine | Admitting: Emergency Medicine

## 2020-01-29 DIAGNOSIS — R739 Hyperglycemia, unspecified: Secondary | ICD-10-CM

## 2020-01-29 DIAGNOSIS — Z79899 Other long term (current) drug therapy: Secondary | ICD-10-CM | POA: Insufficient documentation

## 2020-01-29 DIAGNOSIS — Z7982 Long term (current) use of aspirin: Secondary | ICD-10-CM | POA: Diagnosis not present

## 2020-01-29 DIAGNOSIS — F17210 Nicotine dependence, cigarettes, uncomplicated: Secondary | ICD-10-CM | POA: Diagnosis not present

## 2020-01-29 DIAGNOSIS — I10 Essential (primary) hypertension: Secondary | ICD-10-CM | POA: Diagnosis not present

## 2020-01-29 DIAGNOSIS — L0390 Cellulitis, unspecified: Secondary | ICD-10-CM | POA: Diagnosis not present

## 2020-01-29 DIAGNOSIS — Z7401 Bed confinement status: Secondary | ICD-10-CM | POA: Diagnosis not present

## 2020-01-29 DIAGNOSIS — K760 Fatty (change of) liver, not elsewhere classified: Secondary | ICD-10-CM | POA: Diagnosis not present

## 2020-01-29 DIAGNOSIS — M5489 Other dorsalgia: Secondary | ICD-10-CM | POA: Diagnosis not present

## 2020-01-29 DIAGNOSIS — F29 Unspecified psychosis not due to a substance or known physiological condition: Secondary | ICD-10-CM | POA: Diagnosis not present

## 2020-01-29 DIAGNOSIS — E1165 Type 2 diabetes mellitus with hyperglycemia: Secondary | ICD-10-CM | POA: Insufficient documentation

## 2020-01-29 DIAGNOSIS — R52 Pain, unspecified: Secondary | ICD-10-CM | POA: Diagnosis not present

## 2020-01-29 DIAGNOSIS — R0902 Hypoxemia: Secondary | ICD-10-CM | POA: Diagnosis not present

## 2020-01-29 DIAGNOSIS — L02232 Carbuncle of back [any part, except buttock]: Secondary | ICD-10-CM | POA: Diagnosis not present

## 2020-01-29 DIAGNOSIS — R918 Other nonspecific abnormal finding of lung field: Secondary | ICD-10-CM | POA: Diagnosis not present

## 2020-01-29 DIAGNOSIS — R5381 Other malaise: Secondary | ICD-10-CM | POA: Diagnosis not present

## 2020-01-29 DIAGNOSIS — L03312 Cellulitis of back [any part except buttock]: Secondary | ICD-10-CM | POA: Diagnosis not present

## 2020-01-29 DIAGNOSIS — R109 Unspecified abdominal pain: Secondary | ICD-10-CM | POA: Diagnosis not present

## 2020-01-29 DIAGNOSIS — M549 Dorsalgia, unspecified: Secondary | ICD-10-CM | POA: Diagnosis present

## 2020-01-29 DIAGNOSIS — Z794 Long term (current) use of insulin: Secondary | ICD-10-CM | POA: Diagnosis not present

## 2020-01-29 DIAGNOSIS — L0293 Carbuncle, unspecified: Secondary | ICD-10-CM | POA: Insufficient documentation

## 2020-01-29 DIAGNOSIS — M2550 Pain in unspecified joint: Secondary | ICD-10-CM | POA: Diagnosis not present

## 2020-01-29 DIAGNOSIS — K8020 Calculus of gallbladder without cholecystitis without obstruction: Secondary | ICD-10-CM | POA: Diagnosis not present

## 2020-01-29 LAB — POCT I-STAT EG7
Acid-Base Excess: 4 mmol/L — ABNORMAL HIGH (ref 0.0–2.0)
Bicarbonate: 30.2 mmol/L — ABNORMAL HIGH (ref 20.0–28.0)
Calcium, Ion: 1.07 mmol/L — ABNORMAL LOW (ref 1.15–1.40)
HCT: 44 % (ref 36.0–46.0)
Hemoglobin: 15 g/dL (ref 12.0–15.0)
O2 Saturation: 98 %
Potassium: 5.6 mmol/L — ABNORMAL HIGH (ref 3.5–5.1)
Sodium: 131 mmol/L — ABNORMAL LOW (ref 135–145)
TCO2: 32 mmol/L (ref 22–32)
pCO2, Ven: 48.8 mmHg (ref 44.0–60.0)
pH, Ven: 7.399 (ref 7.250–7.430)
pO2, Ven: 104 mmHg — ABNORMAL HIGH (ref 32.0–45.0)

## 2020-01-29 LAB — BASIC METABOLIC PANEL
Anion gap: 12 (ref 5–15)
BUN: 13 mg/dL (ref 8–23)
CO2: 26 mmol/L (ref 22–32)
Calcium: 8.8 mg/dL — ABNORMAL LOW (ref 8.9–10.3)
Chloride: 96 mmol/L — ABNORMAL LOW (ref 98–111)
Creatinine, Ser: 0.73 mg/dL (ref 0.44–1.00)
GFR calc Af Amer: >60 mL/min (ref >60)
GFR calc non Af Amer: >60 mL/min (ref >60)
Glucose, Bld: 217 mg/dL — ABNORMAL HIGH (ref 70–99)
Potassium: 3.9 mmol/L (ref 3.5–5.1)
Sodium: 134 mmol/L — ABNORMAL LOW (ref 135–145)

## 2020-01-29 LAB — COMPREHENSIVE METABOLIC PANEL
ALT: 16 U/L (ref 0–44)
AST: 46 U/L — ABNORMAL HIGH (ref 15–41)
Albumin: 3.2 g/dL — ABNORMAL LOW (ref 3.5–5.0)
Alkaline Phosphatase: 59 U/L (ref 38–126)
Anion gap: 11 (ref 5–15)
BUN: 16 mg/dL (ref 8–23)
CO2: 25 mmol/L (ref 22–32)
Calcium: 8.8 mg/dL — ABNORMAL LOW (ref 8.9–10.3)
Chloride: 96 mmol/L — ABNORMAL LOW (ref 98–111)
Creatinine, Ser: 0.94 mg/dL (ref 0.44–1.00)
GFR calc Af Amer: >60 mL/min (ref >60)
GFR calc non Af Amer: >60 mL/min (ref >60)
Glucose, Bld: 367 mg/dL — ABNORMAL HIGH (ref 70–99)
Potassium: 5.8 mmol/L — ABNORMAL HIGH (ref 3.5–5.1)
Sodium: 132 mmol/L — ABNORMAL LOW (ref 135–145)
Total Bilirubin: 2.7 mg/dL — ABNORMAL HIGH (ref 0.3–1.2)
Total Protein: 6.2 g/dL — ABNORMAL LOW (ref 6.5–8.1)

## 2020-01-29 LAB — CBC WITH DIFFERENTIAL/PLATELET
Abs Immature Granulocytes: 0.23 10*3/uL — ABNORMAL HIGH (ref 0.00–0.07)
Basophils Absolute: 0.1 10*3/uL (ref 0.0–0.1)
Basophils Relative: 1 %
Eosinophils Absolute: 0.3 10*3/uL (ref 0.0–0.5)
Eosinophils Relative: 2 %
HCT: 44.4 % (ref 36.0–46.0)
Hemoglobin: 14.6 g/dL (ref 12.0–15.0)
Immature Granulocytes: 2 %
Lymphocytes Relative: 10 %
Lymphs Abs: 1.3 10*3/uL (ref 0.7–4.0)
MCH: 29.2 pg (ref 26.0–34.0)
MCHC: 32.9 g/dL (ref 30.0–36.0)
MCV: 88.8 fL (ref 80.0–100.0)
Monocytes Absolute: 0.7 10*3/uL (ref 0.1–1.0)
Monocytes Relative: 6 %
Neutro Abs: 10.1 10*3/uL — ABNORMAL HIGH (ref 1.7–7.7)
Neutrophils Relative %: 79 %
Platelets: 239 10*3/uL (ref 150–400)
RBC: 5 MIL/uL (ref 3.87–5.11)
RDW: 13.6 % (ref 11.5–15.5)
WBC: 12.7 10*3/uL — ABNORMAL HIGH (ref 4.0–10.5)
nRBC: 0 % (ref 0.0–0.2)

## 2020-01-29 LAB — URINALYSIS, ROUTINE W REFLEX MICROSCOPIC
Bilirubin Urine: NEGATIVE
Glucose, UA: >=500 mg/dL — AB
Hgb urine dipstick: NEGATIVE
Ketones, ur: NEGATIVE mg/dL
Leukocytes,Ua: NEGATIVE
Nitrite: NEGATIVE
Protein, ur: 100 mg/dL — AB
Specific Gravity, Urine: 1.028 (ref 1.005–1.030)
pH: 5 (ref 5.0–8.0)

## 2020-01-29 LAB — CBG MONITORING, ED: Glucose-Capillary: 190 mg/dL — ABNORMAL HIGH (ref 70–99)

## 2020-01-29 LAB — I-STAT CREATININE, ED: Creatinine, Ser: 0.8 mg/dL (ref 0.44–1.00)

## 2020-01-29 LAB — LACTIC ACID, PLASMA: Lactic Acid, Venous: 2.7 mmol/L (ref 0.5–1.9)

## 2020-01-29 MED ORDER — SODIUM CHLORIDE 0.9 % IV BOLUS
1000.0000 mL | Freq: Once | INTRAVENOUS | Status: AC
  Administered 2020-01-29: 17:00:00 1000 mL via INTRAVENOUS

## 2020-01-29 MED ORDER — AMOXICILLIN-POT CLAVULANATE 875-125 MG PO TABS
1.0000 | ORAL_TABLET | Freq: Once | ORAL | Status: AC
  Administered 2020-01-29: 1 via ORAL
  Filled 2020-01-29: qty 1

## 2020-01-29 MED ORDER — AMOXICILLIN-POT CLAVULANATE 875-125 MG PO TABS: 1.0000 | ORAL_TABLET | Freq: Two times a day (BID) | ORAL | 0 refills | Status: AC

## 2020-01-29 MED ORDER — IPRATROPIUM-ALBUTEROL 0.5-2.5 (3) MG/3ML IN SOLN
3.0000 mL | Freq: Once | RESPIRATORY_TRACT | Status: AC
  Administered 2020-01-29: 17:00:00 3 mL via RESPIRATORY_TRACT
  Filled 2020-01-29: qty 3

## 2020-01-29 MED ORDER — IOHEXOL 300 MG/ML  SOLN
100.0000 mL | Freq: Once | INTRAMUSCULAR | Status: AC | PRN
  Administered 2020-01-29: 100 mL via INTRAVENOUS

## 2020-01-29 NOTE — ED Triage Notes (Signed)
Pt from home via EMS for mid back pain. EMS noted a abscess located near area of pain that was draining purulent drainage. Area is very tender to touch.   VS: 150/70, HR 86, 98.3 temp, 91% O2 RA CBG 459

## 2020-01-29 NOTE — ED Notes (Signed)
Called [facility] for transport home--Danielle Buchanan 

## 2020-01-29 NOTE — ED Provider Notes (Signed)
4:20 PM: Checkout from Dr. Rex Kras to evaluate after CT scan.  Patient has a suspected cellulitis versus abscess of her lower back.  Earlier evaluation was consistent with infection, and incidental note of hyperkalemia, thought to be related to hemolysis.  Vital signs are stable, she is not suspected to have severe sepsis.  Patient took a high dose of insulin, prior to arrival.  CT Chest W Contrast  Result Date: 01/29/2020 CLINICAL DATA:  64 year old female with back pain. Abscess in the region of the pain with apparent and drainage. EXAM: CT CHEST, ABDOMEN, AND PELVIS WITH CONTRAST TECHNIQUE: Multidetector CT imaging of the chest, abdomen and pelvis was performed following the standard protocol during bolus administration of intravenous contrast. CONTRAST:  184m OMNIPAQUE IOHEXOL 300 MG/ML  SOLN COMPARISON:  None. FINDINGS: CT CHEST FINDINGS Cardiovascular: There is no cardiomegaly or pericardial effusion. There is 3 vessel coronary vascular calcification. There is moderate atherosclerotic calcification of the thoracic aorta. No aneurysmal dilatation or dissection. The origins of the great vessels of the aortic arch appear patent as visualized. The central pulmonary arteries are unremarkable. Mediastinum/Nodes: There is no hilar or mediastinal adenopathy. The esophagus and the thyroid gland are grossly unremarkable. No mediastinal fluid collection or hematoma. Lungs/Pleura: The lungs are clear. There is no pleural effusion or pneumothorax. The central airways are patent. Musculoskeletal: Mildly thickened appearance of the skin of the left breast. Clinical correlation is recommended. There is mild subcutaneous edema of the posterior thoracic wall. There is a 1 cm low attenuating focus with induration of the surrounding tissue underneath the skin at the level of T4 (19/3 and 99/7) likely a small phlegmon. Ultrasound may provide better evaluation. CT ABDOMEN PELVIS FINDINGS There is no intra-abdominal free air or  free fluid. Hepatobiliary: There is fatty infiltration of the liver. No intrahepatic biliary ductal dilatation. There is a small stone in the neck of the gallbladder. No pericholecystic fluid or evidence of acute cholecystitis by CT. Pancreas: Unremarkable. No pancreatic ductal dilatation or surrounding inflammatory changes. Spleen: Normal in size without focal abnormality. Adrenals/Urinary Tract: The adrenal glands are unremarkable. There is no hydronephrosis on either side. Subcentimeter right renal inferior pole hypodense focus is too small to characterize. Mild bilateral perinephric haziness, nonspecific. Correlation with urinalysis recommended to exclude UTI. The visualized ureters and urinary bladder appear unremarkable. Stomach/Bowel: There is no bowel obstruction or active inflammation. The appendix is normal. Vascular/Lymphatic: There is moderate aortoiliac atherosclerotic disease. The IVC is unremarkable. No portal venous gas. There is no adenopathy. Ectasia of the infrarenal abdominal aorta measuring up to 2.5 cm. Reproductive: The uterus and ovaries are grossly unremarkable. Other: Mild subcutaneous edema. There is diastasis of anterior abdominal wall musculature in the midline. Musculoskeletal: Osteopenia. No acute osseous pathology. IMPRESSION: 1. A 1 cm low attenuating focus with induration of the surrounding tissue at the level of T4 likely a small phlegmon or tiny inflamed fluid. Ultrasound may provide better evaluation. 2. Fatty liver. 3. Cholelithiasis. 4. No bowel obstruction. Normal appendix. 5. Aortic Atherosclerosis (ICD10-I70.0). Electronically Signed   By: AAnner CreteM.D.   On: 01/29/2020 18:07   CT ABDOMEN W CONTRAST  Result Date: 01/29/2020 CLINICAL DATA:  64year old female with back pain. Abscess in the region of the pain with apparent and drainage. EXAM: CT CHEST, ABDOMEN, AND PELVIS WITH CONTRAST TECHNIQUE: Multidetector CT imaging of the chest, abdomen and pelvis was  performed following the standard protocol during bolus administration of intravenous contrast. CONTRAST:  1055mOMNIPAQUE IOHEXOL 300 MG/ML  SOLN  COMPARISON:  None. FINDINGS: CT CHEST FINDINGS Cardiovascular: There is no cardiomegaly or pericardial effusion. There is 3 vessel coronary vascular calcification. There is moderate atherosclerotic calcification of the thoracic aorta. No aneurysmal dilatation or dissection. The origins of the great vessels of the aortic arch appear patent as visualized. The central pulmonary arteries are unremarkable. Mediastinum/Nodes: There is no hilar or mediastinal adenopathy. The esophagus and the thyroid gland are grossly unremarkable. No mediastinal fluid collection or hematoma. Lungs/Pleura: The lungs are clear. There is no pleural effusion or pneumothorax. The central airways are patent. Musculoskeletal: Mildly thickened appearance of the skin of the left breast. Clinical correlation is recommended. There is mild subcutaneous edema of the posterior thoracic wall. There is a 1 cm low attenuating focus with induration of the surrounding tissue underneath the skin at the level of T4 (19/3 and 99/7) likely a small phlegmon. Ultrasound may provide better evaluation. CT ABDOMEN PELVIS FINDINGS There is no intra-abdominal free air or free fluid. Hepatobiliary: There is fatty infiltration of the liver. No intrahepatic biliary ductal dilatation. There is a small stone in the neck of the gallbladder. No pericholecystic fluid or evidence of acute cholecystitis by CT. Pancreas: Unremarkable. No pancreatic ductal dilatation or surrounding inflammatory changes. Spleen: Normal in size without focal abnormality. Adrenals/Urinary Tract: The adrenal glands are unremarkable. There is no hydronephrosis on either side. Subcentimeter right renal inferior pole hypodense focus is too small to characterize. Mild bilateral perinephric haziness, nonspecific. Correlation with urinalysis recommended to  exclude UTI. The visualized ureters and urinary bladder appear unremarkable. Stomach/Bowel: There is no bowel obstruction or active inflammation. The appendix is normal. Vascular/Lymphatic: There is moderate aortoiliac atherosclerotic disease. The IVC is unremarkable. No portal venous gas. There is no adenopathy. Ectasia of the infrarenal abdominal aorta measuring up to 2.5 cm. Reproductive: The uterus and ovaries are grossly unremarkable. Other: Mild subcutaneous edema. There is diastasis of anterior abdominal wall musculature in the midline. Musculoskeletal: Osteopenia. No acute osseous pathology. IMPRESSION: 1. A 1 cm low attenuating focus with induration of the surrounding tissue at the level of T4 likely a small phlegmon or tiny inflamed fluid. Ultrasound may provide better evaluation. 2. Fatty liver. 3. Cholelithiasis. 4. No bowel obstruction. Normal appendix. 5. Aortic Atherosclerosis (ICD10-I70.0). Electronically Signed   By: Anner Crete M.D.   On: 01/29/2020 18:07     Patient Vitals for the past 24 hrs:  BP Temp Pulse Resp SpO2  01/29/20 2037 (!) 153/62 -- 85 17 94 %  01/29/20 1830 (!) 142/70 -- -- (!) 26 --  01/29/20 1815 127/70 -- 78 (!) 22 93 %  01/29/20 1800 (!) 141/63 -- 79 (!) 23 92 %  01/29/20 1630 (!) 145/69 -- 80 (!) 23 94 %  01/29/20 1624 136/66 -- 78 18 94 %  01/29/20 1414 -- -- -- -- 91 %  01/29/20 1411 -- -- -- -- 91 %  01/29/20 1358 123/67 98.5 F (36.9 C) 78 (!) 24 92 %       8:35 PM Reevaluation with update and discussion. After initial assessment and treatment, an updated evaluation reveals she is comfortable, moves about easily on the stretcher and states she is ready to go home.  She states she can manage applications of warm compresses on her back, and that she has a home health service that can come see her and help her.  I have discussed this with the care management team and they will make arrangements for home health.  I evaluated the patient's back  wound.   Representative photograph above.  There is an area of redness, slightly raised, approximately 1 inch x 3 inches, vertical, just to the right of the midline.  There is induration without fluctuance.  There is no significant pain with palpation of this area.  There is very indistinct erythema, spreading to the left however this area is not raised at all, and is nontender to palpation. Daleen Bo   Medical Decision Making:  This patient is presenting for evaluation of a sore on her back, which does require a range of treatment options, and is a complaint that involves a moderate risk of morbidity and mortality. The differential diagnoses include cellulitis, deep tissue infection, DKA, serious bacterial infection. I decided   to review old records, and in summary obese patient with insulin-dependent diabetes, status post leg amputations.  She has been troubled by soreness of her back for several days.  No prior similar problems.  She thinks that it is "a boil.". I did not require additional historical information from anyone. Clinical Laboratory Tests Ordered, included .  be met ,blood gas, lactate, chemistry panel, urinalysis, CBC.  Labs reviewed, mild elevation white blood cell count, glucose high repeated and improved, CO2 normal, pH normal, lactate slightly elevated, potassium high, repeated and improved, urinalysis no infection.  Radiologic tests ordered, CT. I independently Visualized: CT images images, which show small carbuncle mid back, at T4 level no deep tissue infection or abscess.  Critical Interventions-clinical evaluation, laboratory evaluation, CT imaging, observation, repeat labs.  Augmentin started in the ED.  After These Interventions, the Patient was reevaluated and was found patient calm comfortable.  She has a carbuncle with very mild surrounding cellulitis.  There is no indication for I&D at this time.  Mild hyperglycemia without DKA.  Glucose improved with fluids and time.  Patient  stable for discharge with outpatient management.  Medications  amoxicillin-clavulanate (AUGMENTIN) 875-125 MG per tablet 1 tablet (has no administration in time range)  ipratropium-albuterol (DUONEB) 0.5-2.5 (3) MG/3ML nebulizer solution 3 mL (3 mLs Nebulization Given 01/29/20 1718)  sodium chloride 0.9 % bolus 1,000 mL (0 mLs Intravenous Stopped 01/29/20 1900)  iohexol (OMNIPAQUE) 300 MG/ML solution 100 mL (100 mLs Intravenous Contrast Given 01/29/20 1653)     CRITICAL CARE-no Performed by: Daleen Bo   Nursing Notes Reviewed/ Care Coordinated Applicable Imaging Reviewed Interpretation of Laboratory Data incorporated into ED treatment  The patient appears reasonably screened and/or stabilized for discharge and I doubt any other medical condition or other [facility] requiring further screening, evaluation, or treatment in the ED at this time prior to discharge.  8:45 PM-I discussed the situation with her legal guardian, [name], who helps manage things at home and will help watch over her and get her prescription, tomorrow.  Plan: Home Medications-continue usual; Home Treatments-moist compresses to sore area back, 3 times daily until better.; return here if the recommended treatment, does not improve the symptoms; Recommended follow up-PCP follow-up 1 week and as needed      Daleen Bo, MD 01/29/20 2050

## 2020-01-29 NOTE — ED Notes (Signed)
Taken to CT.

## 2020-01-29 NOTE — ED Provider Notes (Signed)
[facility] EMERGENCY DEPARTMENT Provider Note   CSN: [id number] Arrival date & time: 01/29/20  1344     History Chief Complaint  Patient presents with  . Back Pain    Danielle Buchanan is a 64 y.o. female.  64yo F w/ PMH including IDDM, HTN, HLD, b/l leg amputations, OSA who p/w back pain.  Yesterday the patient began having midline thoracic back soreness that has progressed since it began and she did not sleep because of the area is so painful.  Pain is worse if she tries to lay on her back. EMS noted an abscess appearing area w/ drainage. She denies any fevers. She states she is compliant w/ meds and took 90u insulin prior to arrival. BG 459 by EMS.   She reports tobacco use but denies SOB or h/o COPD/asthma.  The history is provided by the patient.  Optician, dispensing      Past Medical History:  Diagnosis Date  . Allergic rhinitis   . Depression   . Diabetes (HCC)   . Dyslipidemia   . HTN (hypertension)   . Insomnia   . OSA (obstructive sleep apnea)    no cpap  . Urinary incontinence     Patient Active Problem List   Diagnosis Date Noted  . Right below-knee amputee (HCC) 01/23/2019  . Gangrene of right foot (HCC) 01/17/2019  . History of transmetatarsal amputation of right foot (HCC) 12/13/2018  . At risk for adverse drug event 11/27/2018  . Osteomyelitis of third toe of right foot (HCC) 11/12/2018  . Pain in finger of right hand 02/20/2018  . Atherosclerosis of native arteries of the extremities with gangrene (HCC) 11/01/2017  . Unilateral AKA, left (HCC)   . Post-operative pain   . OSA (obstructive sleep apnea)   . Benign essential HTN   . DM (diabetes mellitus), secondary, uncontrolled, with peripheral vascular complications (HCC)   . Hyperkalemia   . Leukocytosis   . Acute blood loss anemia   . Amputation stump infection (HCC) 08/23/2017  . Amputation of left lower extremity above knee upon examination (HCC) 08/23/2017  . PAD  (peripheral artery disease) (HCC) 07/23/2017  . Cellulitis 06/10/2017  . Hyponatremia 06/10/2017  . Fever 06/10/2017  . Rash 05/24/2017  . HTN (hypertension)   . Allergic rhinitis   . Insomnia   . Depression     Past Surgical History:  Procedure Laterality Date  . ABDOMINAL AORTOGRAM W/LOWER EXTREMITY N/A 06/24/2017   Procedure: ABDOMINAL AORTOGRAM W/LOWER EXTREMITY;  Surgeon: Maeola Harman, MD;  Location: [facility] INVASIVE CV LAB;  Service: Cardiovascular;  Laterality: N/A;  . ABDOMINAL AORTOGRAM W/LOWER EXTREMITY Right 10/09/2017   Procedure: ABDOMINAL AORTOGRAM W/LOWER EXTREMITY;  Surgeon: Maeola Harman, MD;  Location: [facility] INVASIVE CV LAB;  Service: Cardiovascular;  Laterality: Right;  . AMPUTATION Left 07/23/2017   Procedure: AMPUTATION  BELOW KNEE;  Surgeon: Maeola Harman, MD;  Location: [facility] OR;  Service: Vascular;  Laterality: Left;  . AMPUTATION Left 08/12/2017   Procedure: REVISION BELOW KNEE;  Surgeon: Maeola Harman, MD;  Location: [facility] OR;  Service: Vascular;  Laterality: Left;  . AMPUTATION Left 08/27/2017   Procedure: left ABOVE KNEE AMPUTATION;  Surgeon: Maeola Harman, MD;  Location: [facility] OR;  Service: Vascular;  Laterality: Left;  . AMPUTATION Left 08/30/2017   Procedure: REVISION AMPUTATION ABOVE KNEE;  Surgeon: Nada Libman, MD;  Location: [facility] OR;  Service: Vascular;  Laterality: Left;  . AMPUTATION Right 11/04/2017  Procedure: AMPUTATION RIGHT FOURTH TOE;  Surgeon: Maeola Harman, MD;  Location: [facility] OR;  Service: Vascular;  Laterality: Right;  . AMPUTATION Right 11/12/2018   Procedure: AMPUTATION OF second, third and fifth toes;  Surgeon: Nada Libman, MD;  Location: [facility] OR;  Service: Vascular;  Laterality: Right;  . AMPUTATION Right 01/19/2019   Procedure: AMPUTATION BELOW KNEE;  Surgeon: Sherren Kerns, MD;  Location: [facility] OR;  Service: Vascular;  Laterality: Right;  . APPLICATION OF WOUND VAC Left 08/27/2017     Procedure: APPLICATION OF WOUND VAC;  Surgeon: Maeola Harman, MD;  Location: [facility] OR;  Service: Vascular;  Laterality: Left;  . CARPAL TUNNEL RELEASE Right   . COLONOSCOPY    . LOWER EXTREMITY ANGIOGRAM Right 11/12/2018   Procedure: ANGIOGRAM OF Right LEG;  Surgeon: Nada Libman, MD;  Location: [facility] OR;  Service: Vascular;  Laterality: Right;  . LOWER EXTREMITY INTERVENTION Right 10/09/2017   Procedure: LOWER EXTREMITY INTERVENTION;  Surgeon: Maeola Harman, MD;  Location: [facility] INVASIVE CV LAB;  Service: Cardiovascular;  Laterality: Right;  . TOE AMPUTATION Right 11/12/2018   2ND 3RD & 5TH TOE   . TRANSMETATARSAL AMPUTATION Right 11/19/2018   Procedure: TRANSMETATARSAL AMPUTATION REVISION;  Surgeon: Nada Libman, MD;  Location: [facility] OR;  Service: Vascular;  Laterality: Right;     OB History   No obstetric history on file.     Family History  Problem Relation Age of Onset  . Heart attack Mother   . Alcohol abuse Mother   . Heart attack Father   . Alcohol abuse Father   . Heart attack Sister   . Heart attack Brother   . Hypercholesterolemia Sister   . Diabetes Neg Hx     Social History   Tobacco Use  . Smoking status: Current Every Day Smoker    Packs/day: 1.00    Years: 51.00    Pack years: 51.00    Types: Cigarettes  . Smokeless tobacco: Never Used  Substance Use Topics  . Alcohol use: No    Alcohol/week: 0.0 standard drinks  . Drug use: No    Home Medications Prior to Admission medications   Medication Sig Start Date End Date Taking? Authorizing Provider  acetaminophen (TYLENOL) 325 MG tablet Take 1-2 tablets (325-650 mg total) by mouth every 4 (four) hours as needed for mild pain (headache, or temp >/= 101 F). 02/06/19   Angiulli, Mcarthur Rossetti, PA-C  aspirin 81 MG EC tablet Take 1 tablet (81 mg total) by mouth daily. 02/06/19   Angiulli, Mcarthur Rossetti, PA-C  gabapentin (NEURONTIN) 300 MG capsule Take 1 capsule (300 mg total) by mouth at bedtime. 02/06/19    Angiulli, Mcarthur Rossetti, PA-C  glimepiride (AMARYL) 2 MG tablet Take 1 tablet (2 mg total) by mouth daily with breakfast. 02/06/19   Angiulli, Mcarthur Rossetti, PA-C  insulin aspart protamine - aspart (NOVOLOG MIX 70/30 FLEXPEN) (70-30) 100 UNIT/ML FlexPen Inject 0.75 mLs (75 Units total) into the skin 2 (two) times daily with a meal. 02/06/19   Angiulli, Mcarthur Rossetti, PA-C  LORazepam (ATIVAN) 0.5 MG tablet Take 1 tablet (0.5 mg total) by mouth 2 (two) times daily as needed for anxiety. 02/06/19   Angiulli, Mcarthur Rossetti, PA-C  losartan (COZAAR) 100 MG tablet Take 1 tablet (100 mg total) by mouth daily. 02/06/19   Angiulli, Mcarthur Rossetti, PA-C  Multiple Vitamin (MULTIVITAMIN WITH MINERALS) TABS tablet Place 1 tablet into feeding tube daily. Patient taking differently: Take 1 tablet by  mouth daily.  11/23/18   Marguerita Merles Latif, DO  nicotine polacrilex (NICORELIEF) 2 MG gum Take 1 each (2 mg total) by mouth daily as needed for smoking cessation. Patient taking differently: Take 2 mg by mouth 4 (four) times daily as needed for smoking cessation.  12/15/18   Trudie Reed, Arlo C, PA-C  NOVOLIN 70/30 (70-30) 100 UNIT/ML injection INJECT SUBCUTANEOUSLY  120 UNITS WITH BREAKFAST  AND  90 UNITS WITH SUPPER 03/24/19   [name], MD  oxyCODONE (OXY IR/ROXICODONE) 5 MG immediate release tablet Take 1-2 tablets (5-10 mg total) by mouth every 4 (four) hours as needed for moderate pain. 02/06/19   Angiulli, Mcarthur Rossetti, PA-C  pantoprazole (PROTONIX) 40 MG tablet Take 1 tablet (40 mg total) by mouth daily. 02/06/19   Angiulli, Mcarthur Rossetti, PA-C  polyethylene glycol (MIRALAX / GLYCOLAX) packet Take 17 g by mouth daily as needed for moderate constipation. 12/15/18   Edmon Crape C, PA-C  pravastatin (PRAVACHOL) 40 MG tablet Take 1 tablet (40 mg total) by mouth daily. 02/06/19   Angiulli, Mcarthur Rossetti, PA-C  sertraline (ZOLOFT) 100 MG tablet Take 1 tablet (100 mg total) by mouth daily. 02/06/19   Angiulli, Mcarthur Rossetti, PA-C  traZODone (DESYREL) 50 MG tablet Take 1 tablet (50 mg  total) by mouth at bedtime. 02/06/19   Angiulli, Mcarthur Rossetti, PA-C  TRUE METRIX BLOOD GLUCOSE TEST test strip USE TO CHECK YOUR BLOOD SUGAR TWICE A DAY (DX  E11.21) IN VITRO 90 DAYS 02/06/19   Angiulli, Mcarthur Rossetti, PA-C  Vitamin E 180 MG CAPS Take 180 mg by mouth daily. 12/15/18   Edmon Crape C, PA-C  Zinc 50 MG TABS Take 1 tablet (50 mg total) by mouth daily. 12/15/18   Edmon Crape C, PA-C    Allergies    Metformin and related, Milk-related compounds, and Doxycycline  Review of Systems   Review of Systems All other systems reviewed and are negative except that which was mentioned in HPI  Physical Exam Updated Vital Signs BP 123/67 (BP Location: Right Arm)   Pulse 78   Temp 98.5 F (36.9 C)   Resp (!) 24   SpO2 91%   Physical Exam Vitals and nursing note reviewed.  Constitutional:      General: She is not in acute distress.    Appearance: She is well-developed. She is obese.     Comments: Uncomfortable, chronically ill appearing  HENT:     Head: Normocephalic and atraumatic.  Eyes:     Conjunctiva/sclera: Conjunctivae normal.     Pupils: Pupils are equal, round, and reactive to light.  Cardiovascular:     Rate and Rhythm: Normal rate and regular rhythm.     Heart sounds: Normal heart sounds. No murmur.  Pulmonary:     Effort: Pulmonary effort is normal.     Breath sounds: Wheezing present.     Comments: Scattered expiratory wheezes b/l Abdominal:     General: Bowel sounds are normal. There is no distension.     Palpations: Abdomen is soft.     Tenderness: There is no abdominal tenderness.  Musculoskeletal:     Cervical back: Neck supple.     Comments: R well-healed BKA, L well-healed AKA  Skin:    General: Skin is warm and dry.     Comments: Central area of erythema, induration, and darkened skin on central mid-thoracic back with large area of surrounding swelling, tender to palpation, no active drainage, no crepitus  Neurological:     Mental  Status: She is alert and oriented  to person, place, and time.     Comments: Fluent speech  Psychiatric:        Judgment: Judgment normal.     ED Results / Procedures / Treatments   Labs (all labs ordered are listed, but only abnormal results are displayed) Labs Reviewed  COMPREHENSIVE METABOLIC PANEL - Abnormal; Notable for the following components:      Result Value   Sodium 132 (*)    Potassium 5.8 (*)    Chloride 96 (*)    Glucose, Bld 367 (*)    Calcium 8.8 (*)    Total Protein 6.2 (*)    Albumin 3.2 (*)    AST 46 (*)    Total Bilirubin 2.7 (*)    All other components within normal limits  CBC WITH DIFFERENTIAL/PLATELET - Abnormal; Notable for the following components:   WBC 12.7 (*)    Neutro Abs 10.1 (*)    Abs Immature Granulocytes 0.23 (*)    All other components within normal limits  POCT I-STAT EG7 - Abnormal; Notable for the following components:   pO2, Ven 104.0 (*)    Bicarbonate 30.2 (*)    Acid-Base Excess 4.0 (*)    Sodium 131 (*)    Potassium 5.6 (*)    Calcium, Ion 1.07 (*)    All other components within normal limits  LACTIC ACID, PLASMA  URINALYSIS, ROUTINE W REFLEX MICROSCOPIC  BASIC METABOLIC PANEL  I-STAT CREATININE, ED  I-STAT VENOUS BLOOD GAS, ED    EKG None  Radiology No results found.  Procedures Procedures (including critical care time)  Medications Ordered in ED Medications  ipratropium-albuterol (DUONEB) 0.5-2.5 (3) MG/3ML nebulizer solution 3 mL (has no administration in time range)  sodium chloride 0.9 % bolus 1,000 mL (has no administration in time range)    ED Course  I have reviewed the triage vital signs and the nursing notes.  Pertinent labs & imaging results that were available during my care of the patient were reviewed by me and considered in my medical decision making (see chart for details).    MDM Rules/Calculators/A&P                      VSS on arrival, no signs/sx of sepsis. Exam suggests skin infection on central thoracic back. She is at  risk for deep space infection and complications such as nec fasc given her diabetes and previous amputations, therefore obtained CT to evaluate for deep space infection or gas in tissues.   Labs show normal creatinine, K 5.8 but suspect hemolysis, glucose 367 w/ normal AG; WBC 12.7.  I am signing out to the oncoming provider pending CT results.   Final Clinical Impression(s) / ED Diagnoses Final diagnoses:  None    Rx / DC Orders ED Discharge Orders    None       Demontray Franta, Wenda Overland, MD 01/29/20 1537

## 2020-01-29 NOTE — Discharge Instructions (Signed)
The sore on your back is secondary to a carbuncle with mild associated cellulitis.  We are treating you with an antibiotic which was sent to your pharmacy.  To help it improve, use a warm moist compress on the sore area 3 times a day for 30 minutes, to improve healing.

## 2020-01-29 NOTE — ED Notes (Signed)
Ask patient for urine sample, patient stated that she did not need to urinate at this time. 

## 2020-01-29 NOTE — ED Notes (Signed)
Tammy Sours nephew [phone] looking for an update on pt

## 2020-01-29 NOTE — Care Management (Addendum)
ED CM met with patient at bedside, Patient is active with [facility] at home. EDP is adding additional HH. ED CM faxed orders to [facility] and sent message to [facility] for [facility].  Patient will be seen over the weekend.

## 2020-02-01 DIAGNOSIS — Z794 Long term (current) use of insulin: Secondary | ICD-10-CM | POA: Diagnosis not present

## 2020-02-01 DIAGNOSIS — E1140 Type 2 diabetes mellitus with diabetic neuropathy, unspecified: Secondary | ICD-10-CM | POA: Diagnosis not present

## 2020-02-01 DIAGNOSIS — F4310 Post-traumatic stress disorder, unspecified: Secondary | ICD-10-CM | POA: Diagnosis not present

## 2020-02-01 DIAGNOSIS — I10 Essential (primary) hypertension: Secondary | ICD-10-CM | POA: Diagnosis not present

## 2020-02-01 DIAGNOSIS — Z7982 Long term (current) use of aspirin: Secondary | ICD-10-CM | POA: Diagnosis not present

## 2020-02-01 DIAGNOSIS — Z4781 Encounter for orthopedic aftercare following surgical amputation: Secondary | ICD-10-CM | POA: Diagnosis not present

## 2020-02-01 DIAGNOSIS — E785 Hyperlipidemia, unspecified: Secondary | ICD-10-CM | POA: Diagnosis not present

## 2020-02-01 DIAGNOSIS — G4700 Insomnia, unspecified: Secondary | ICD-10-CM | POA: Diagnosis not present

## 2020-02-01 DIAGNOSIS — F329 Major depressive disorder, single episode, unspecified: Secondary | ICD-10-CM | POA: Diagnosis not present

## 2020-02-03 DIAGNOSIS — E785 Hyperlipidemia, unspecified: Secondary | ICD-10-CM | POA: Diagnosis not present

## 2020-02-03 DIAGNOSIS — F329 Major depressive disorder, single episode, unspecified: Secondary | ICD-10-CM | POA: Diagnosis not present

## 2020-02-03 DIAGNOSIS — G4700 Insomnia, unspecified: Secondary | ICD-10-CM | POA: Diagnosis not present

## 2020-02-03 DIAGNOSIS — Z794 Long term (current) use of insulin: Secondary | ICD-10-CM | POA: Diagnosis not present

## 2020-02-03 DIAGNOSIS — Z7982 Long term (current) use of aspirin: Secondary | ICD-10-CM | POA: Diagnosis not present

## 2020-02-03 DIAGNOSIS — E1140 Type 2 diabetes mellitus with diabetic neuropathy, unspecified: Secondary | ICD-10-CM | POA: Diagnosis not present

## 2020-02-03 DIAGNOSIS — Z4781 Encounter for orthopedic aftercare following surgical amputation: Secondary | ICD-10-CM | POA: Diagnosis not present

## 2020-02-03 DIAGNOSIS — F4310 Post-traumatic stress disorder, unspecified: Secondary | ICD-10-CM | POA: Diagnosis not present

## 2020-02-03 DIAGNOSIS — I10 Essential (primary) hypertension: Secondary | ICD-10-CM | POA: Diagnosis not present

## 2020-02-04 DIAGNOSIS — G4700 Insomnia, unspecified: Secondary | ICD-10-CM | POA: Diagnosis not present

## 2020-02-04 DIAGNOSIS — I10 Essential (primary) hypertension: Secondary | ICD-10-CM | POA: Diagnosis not present

## 2020-02-04 DIAGNOSIS — E785 Hyperlipidemia, unspecified: Secondary | ICD-10-CM | POA: Diagnosis not present

## 2020-02-04 DIAGNOSIS — Z4781 Encounter for orthopedic aftercare following surgical amputation: Secondary | ICD-10-CM | POA: Diagnosis not present

## 2020-02-04 DIAGNOSIS — Z794 Long term (current) use of insulin: Secondary | ICD-10-CM | POA: Diagnosis not present

## 2020-02-04 DIAGNOSIS — Z7982 Long term (current) use of aspirin: Secondary | ICD-10-CM | POA: Diagnosis not present

## 2020-02-04 DIAGNOSIS — F329 Major depressive disorder, single episode, unspecified: Secondary | ICD-10-CM | POA: Diagnosis not present

## 2020-02-04 DIAGNOSIS — F4310 Post-traumatic stress disorder, unspecified: Secondary | ICD-10-CM | POA: Diagnosis not present

## 2020-02-04 DIAGNOSIS — E1140 Type 2 diabetes mellitus with diabetic neuropathy, unspecified: Secondary | ICD-10-CM | POA: Diagnosis not present

## 2020-02-05 DIAGNOSIS — F4310 Post-traumatic stress disorder, unspecified: Secondary | ICD-10-CM | POA: Diagnosis not present

## 2020-02-05 DIAGNOSIS — Z4781 Encounter for orthopedic aftercare following surgical amputation: Secondary | ICD-10-CM | POA: Diagnosis not present

## 2020-02-05 DIAGNOSIS — F329 Major depressive disorder, single episode, unspecified: Secondary | ICD-10-CM | POA: Diagnosis not present

## 2020-02-05 DIAGNOSIS — Z7982 Long term (current) use of aspirin: Secondary | ICD-10-CM | POA: Diagnosis not present

## 2020-02-05 DIAGNOSIS — G4700 Insomnia, unspecified: Secondary | ICD-10-CM | POA: Diagnosis not present

## 2020-02-05 DIAGNOSIS — E785 Hyperlipidemia, unspecified: Secondary | ICD-10-CM | POA: Diagnosis not present

## 2020-02-05 DIAGNOSIS — I10 Essential (primary) hypertension: Secondary | ICD-10-CM | POA: Diagnosis not present

## 2020-02-05 DIAGNOSIS — Z794 Long term (current) use of insulin: Secondary | ICD-10-CM | POA: Diagnosis not present

## 2020-02-05 DIAGNOSIS — E1140 Type 2 diabetes mellitus with diabetic neuropathy, unspecified: Secondary | ICD-10-CM | POA: Diagnosis not present

## 2020-02-09 DIAGNOSIS — L02212 Cutaneous abscess of back [any part, except buttock]: Secondary | ICD-10-CM | POA: Diagnosis not present

## 2020-02-10 DIAGNOSIS — F329 Major depressive disorder, single episode, unspecified: Secondary | ICD-10-CM | POA: Diagnosis not present

## 2020-02-10 DIAGNOSIS — Z7982 Long term (current) use of aspirin: Secondary | ICD-10-CM | POA: Diagnosis not present

## 2020-02-10 DIAGNOSIS — G4700 Insomnia, unspecified: Secondary | ICD-10-CM | POA: Diagnosis not present

## 2020-02-10 DIAGNOSIS — Z4781 Encounter for orthopedic aftercare following surgical amputation: Secondary | ICD-10-CM | POA: Diagnosis not present

## 2020-02-10 DIAGNOSIS — I10 Essential (primary) hypertension: Secondary | ICD-10-CM | POA: Diagnosis not present

## 2020-02-10 DIAGNOSIS — Z794 Long term (current) use of insulin: Secondary | ICD-10-CM | POA: Diagnosis not present

## 2020-02-10 DIAGNOSIS — E1140 Type 2 diabetes mellitus with diabetic neuropathy, unspecified: Secondary | ICD-10-CM | POA: Diagnosis not present

## 2020-02-10 DIAGNOSIS — F4310 Post-traumatic stress disorder, unspecified: Secondary | ICD-10-CM | POA: Diagnosis not present

## 2020-02-10 DIAGNOSIS — E785 Hyperlipidemia, unspecified: Secondary | ICD-10-CM | POA: Diagnosis not present

## 2020-02-11 DIAGNOSIS — Z7982 Long term (current) use of aspirin: Secondary | ICD-10-CM | POA: Diagnosis not present

## 2020-02-11 DIAGNOSIS — F4310 Post-traumatic stress disorder, unspecified: Secondary | ICD-10-CM | POA: Diagnosis not present

## 2020-02-11 DIAGNOSIS — G4700 Insomnia, unspecified: Secondary | ICD-10-CM | POA: Diagnosis not present

## 2020-02-11 DIAGNOSIS — E1140 Type 2 diabetes mellitus with diabetic neuropathy, unspecified: Secondary | ICD-10-CM | POA: Diagnosis not present

## 2020-02-11 DIAGNOSIS — Z4781 Encounter for orthopedic aftercare following surgical amputation: Secondary | ICD-10-CM | POA: Diagnosis not present

## 2020-02-11 DIAGNOSIS — F329 Major depressive disorder, single episode, unspecified: Secondary | ICD-10-CM | POA: Diagnosis not present

## 2020-02-11 DIAGNOSIS — E785 Hyperlipidemia, unspecified: Secondary | ICD-10-CM | POA: Diagnosis not present

## 2020-02-11 DIAGNOSIS — Z794 Long term (current) use of insulin: Secondary | ICD-10-CM | POA: Diagnosis not present

## 2020-02-11 DIAGNOSIS — I10 Essential (primary) hypertension: Secondary | ICD-10-CM | POA: Diagnosis not present

## 2020-02-12 DIAGNOSIS — F329 Major depressive disorder, single episode, unspecified: Secondary | ICD-10-CM | POA: Diagnosis not present

## 2020-02-12 DIAGNOSIS — I10 Essential (primary) hypertension: Secondary | ICD-10-CM | POA: Diagnosis not present

## 2020-02-12 DIAGNOSIS — Z794 Long term (current) use of insulin: Secondary | ICD-10-CM | POA: Diagnosis not present

## 2020-02-12 DIAGNOSIS — Z4781 Encounter for orthopedic aftercare following surgical amputation: Secondary | ICD-10-CM | POA: Diagnosis not present

## 2020-02-12 DIAGNOSIS — F4310 Post-traumatic stress disorder, unspecified: Secondary | ICD-10-CM | POA: Diagnosis not present

## 2020-02-12 DIAGNOSIS — E785 Hyperlipidemia, unspecified: Secondary | ICD-10-CM | POA: Diagnosis not present

## 2020-02-12 DIAGNOSIS — G4700 Insomnia, unspecified: Secondary | ICD-10-CM | POA: Diagnosis not present

## 2020-02-12 DIAGNOSIS — Z7982 Long term (current) use of aspirin: Secondary | ICD-10-CM | POA: Diagnosis not present

## 2020-02-12 DIAGNOSIS — E1140 Type 2 diabetes mellitus with diabetic neuropathy, unspecified: Secondary | ICD-10-CM | POA: Diagnosis not present

## 2020-02-16 DIAGNOSIS — F4310 Post-traumatic stress disorder, unspecified: Secondary | ICD-10-CM | POA: Diagnosis not present

## 2020-02-16 DIAGNOSIS — Z794 Long term (current) use of insulin: Secondary | ICD-10-CM | POA: Diagnosis not present

## 2020-02-16 DIAGNOSIS — F329 Major depressive disorder, single episode, unspecified: Secondary | ICD-10-CM | POA: Diagnosis not present

## 2020-02-16 DIAGNOSIS — Z7982 Long term (current) use of aspirin: Secondary | ICD-10-CM | POA: Diagnosis not present

## 2020-02-16 DIAGNOSIS — E1140 Type 2 diabetes mellitus with diabetic neuropathy, unspecified: Secondary | ICD-10-CM | POA: Diagnosis not present

## 2020-02-16 DIAGNOSIS — G4700 Insomnia, unspecified: Secondary | ICD-10-CM | POA: Diagnosis not present

## 2020-02-16 DIAGNOSIS — I10 Essential (primary) hypertension: Secondary | ICD-10-CM | POA: Diagnosis not present

## 2020-02-16 DIAGNOSIS — Z4781 Encounter for orthopedic aftercare following surgical amputation: Secondary | ICD-10-CM | POA: Diagnosis not present

## 2020-02-16 DIAGNOSIS — E785 Hyperlipidemia, unspecified: Secondary | ICD-10-CM | POA: Diagnosis not present

## 2020-02-17 DIAGNOSIS — F329 Major depressive disorder, single episode, unspecified: Secondary | ICD-10-CM | POA: Diagnosis not present

## 2020-02-17 DIAGNOSIS — F4310 Post-traumatic stress disorder, unspecified: Secondary | ICD-10-CM | POA: Diagnosis not present

## 2020-02-17 DIAGNOSIS — Z4781 Encounter for orthopedic aftercare following surgical amputation: Secondary | ICD-10-CM | POA: Diagnosis not present

## 2020-02-17 DIAGNOSIS — G4700 Insomnia, unspecified: Secondary | ICD-10-CM | POA: Diagnosis not present

## 2020-02-17 DIAGNOSIS — E785 Hyperlipidemia, unspecified: Secondary | ICD-10-CM | POA: Diagnosis not present

## 2020-02-17 DIAGNOSIS — I10 Essential (primary) hypertension: Secondary | ICD-10-CM | POA: Diagnosis not present

## 2020-02-17 DIAGNOSIS — Z7982 Long term (current) use of aspirin: Secondary | ICD-10-CM | POA: Diagnosis not present

## 2020-02-17 DIAGNOSIS — E1140 Type 2 diabetes mellitus with diabetic neuropathy, unspecified: Secondary | ICD-10-CM | POA: Diagnosis not present

## 2020-02-17 DIAGNOSIS — Z794 Long term (current) use of insulin: Secondary | ICD-10-CM | POA: Diagnosis not present

## 2020-02-18 DIAGNOSIS — F329 Major depressive disorder, single episode, unspecified: Secondary | ICD-10-CM | POA: Diagnosis not present

## 2020-02-18 DIAGNOSIS — E1140 Type 2 diabetes mellitus with diabetic neuropathy, unspecified: Secondary | ICD-10-CM | POA: Diagnosis not present

## 2020-02-18 DIAGNOSIS — Z7982 Long term (current) use of aspirin: Secondary | ICD-10-CM | POA: Diagnosis not present

## 2020-02-18 DIAGNOSIS — G4700 Insomnia, unspecified: Secondary | ICD-10-CM | POA: Diagnosis not present

## 2020-02-18 DIAGNOSIS — Z4781 Encounter for orthopedic aftercare following surgical amputation: Secondary | ICD-10-CM | POA: Diagnosis not present

## 2020-02-18 DIAGNOSIS — F4310 Post-traumatic stress disorder, unspecified: Secondary | ICD-10-CM | POA: Diagnosis not present

## 2020-02-18 DIAGNOSIS — E785 Hyperlipidemia, unspecified: Secondary | ICD-10-CM | POA: Diagnosis not present

## 2020-02-18 DIAGNOSIS — I10 Essential (primary) hypertension: Secondary | ICD-10-CM | POA: Diagnosis not present

## 2020-02-18 DIAGNOSIS — Z794 Long term (current) use of insulin: Secondary | ICD-10-CM | POA: Diagnosis not present

## 2020-02-18 DIAGNOSIS — L03312 Cellulitis of back [any part except buttock]: Secondary | ICD-10-CM | POA: Diagnosis not present

## 2020-02-24 DIAGNOSIS — F329 Major depressive disorder, single episode, unspecified: Secondary | ICD-10-CM | POA: Diagnosis not present

## 2020-02-24 DIAGNOSIS — G4700 Insomnia, unspecified: Secondary | ICD-10-CM | POA: Diagnosis not present

## 2020-02-24 DIAGNOSIS — Z4781 Encounter for orthopedic aftercare following surgical amputation: Secondary | ICD-10-CM | POA: Diagnosis not present

## 2020-02-24 DIAGNOSIS — Z794 Long term (current) use of insulin: Secondary | ICD-10-CM | POA: Diagnosis not present

## 2020-02-24 DIAGNOSIS — E785 Hyperlipidemia, unspecified: Secondary | ICD-10-CM | POA: Diagnosis not present

## 2020-02-24 DIAGNOSIS — I10 Essential (primary) hypertension: Secondary | ICD-10-CM | POA: Diagnosis not present

## 2020-02-24 DIAGNOSIS — E1140 Type 2 diabetes mellitus with diabetic neuropathy, unspecified: Secondary | ICD-10-CM | POA: Diagnosis not present

## 2020-02-24 DIAGNOSIS — F4310 Post-traumatic stress disorder, unspecified: Secondary | ICD-10-CM | POA: Diagnosis not present

## 2020-02-24 DIAGNOSIS — Z7982 Long term (current) use of aspirin: Secondary | ICD-10-CM | POA: Diagnosis not present

## 2020-02-25 DIAGNOSIS — G4700 Insomnia, unspecified: Secondary | ICD-10-CM | POA: Diagnosis not present

## 2020-02-25 DIAGNOSIS — F4310 Post-traumatic stress disorder, unspecified: Secondary | ICD-10-CM | POA: Diagnosis not present

## 2020-02-25 DIAGNOSIS — I10 Essential (primary) hypertension: Secondary | ICD-10-CM | POA: Diagnosis not present

## 2020-02-25 DIAGNOSIS — Z794 Long term (current) use of insulin: Secondary | ICD-10-CM | POA: Diagnosis not present

## 2020-02-25 DIAGNOSIS — F329 Major depressive disorder, single episode, unspecified: Secondary | ICD-10-CM | POA: Diagnosis not present

## 2020-02-25 DIAGNOSIS — E785 Hyperlipidemia, unspecified: Secondary | ICD-10-CM | POA: Diagnosis not present

## 2020-02-25 DIAGNOSIS — Z7982 Long term (current) use of aspirin: Secondary | ICD-10-CM | POA: Diagnosis not present

## 2020-02-25 DIAGNOSIS — E1140 Type 2 diabetes mellitus with diabetic neuropathy, unspecified: Secondary | ICD-10-CM | POA: Diagnosis not present

## 2020-02-25 DIAGNOSIS — Z4781 Encounter for orthopedic aftercare following surgical amputation: Secondary | ICD-10-CM | POA: Diagnosis not present

## 2020-02-26 DIAGNOSIS — Z4781 Encounter for orthopedic aftercare following surgical amputation: Secondary | ICD-10-CM | POA: Diagnosis not present

## 2020-02-26 DIAGNOSIS — E785 Hyperlipidemia, unspecified: Secondary | ICD-10-CM | POA: Diagnosis not present

## 2020-02-26 DIAGNOSIS — I10 Essential (primary) hypertension: Secondary | ICD-10-CM | POA: Diagnosis not present

## 2020-02-26 DIAGNOSIS — Z7982 Long term (current) use of aspirin: Secondary | ICD-10-CM | POA: Diagnosis not present

## 2020-02-26 DIAGNOSIS — G4700 Insomnia, unspecified: Secondary | ICD-10-CM | POA: Diagnosis not present

## 2020-02-26 DIAGNOSIS — F4310 Post-traumatic stress disorder, unspecified: Secondary | ICD-10-CM | POA: Diagnosis not present

## 2020-02-26 DIAGNOSIS — E1140 Type 2 diabetes mellitus with diabetic neuropathy, unspecified: Secondary | ICD-10-CM | POA: Diagnosis not present

## 2020-02-26 DIAGNOSIS — Z794 Long term (current) use of insulin: Secondary | ICD-10-CM | POA: Diagnosis not present

## 2020-02-26 DIAGNOSIS — F329 Major depressive disorder, single episode, unspecified: Secondary | ICD-10-CM | POA: Diagnosis not present

## 2020-02-29 DIAGNOSIS — E785 Hyperlipidemia, unspecified: Secondary | ICD-10-CM | POA: Diagnosis not present

## 2020-02-29 DIAGNOSIS — I10 Essential (primary) hypertension: Secondary | ICD-10-CM | POA: Diagnosis not present

## 2020-02-29 DIAGNOSIS — Z794 Long term (current) use of insulin: Secondary | ICD-10-CM | POA: Diagnosis not present

## 2020-02-29 DIAGNOSIS — F329 Major depressive disorder, single episode, unspecified: Secondary | ICD-10-CM | POA: Diagnosis not present

## 2020-02-29 DIAGNOSIS — E1140 Type 2 diabetes mellitus with diabetic neuropathy, unspecified: Secondary | ICD-10-CM | POA: Diagnosis not present

## 2020-02-29 DIAGNOSIS — G4700 Insomnia, unspecified: Secondary | ICD-10-CM | POA: Diagnosis not present

## 2020-02-29 DIAGNOSIS — F4310 Post-traumatic stress disorder, unspecified: Secondary | ICD-10-CM | POA: Diagnosis not present

## 2020-02-29 DIAGNOSIS — Z7982 Long term (current) use of aspirin: Secondary | ICD-10-CM | POA: Diagnosis not present

## 2020-02-29 DIAGNOSIS — Z4781 Encounter for orthopedic aftercare following surgical amputation: Secondary | ICD-10-CM | POA: Diagnosis not present

## 2020-03-01 DIAGNOSIS — E1140 Type 2 diabetes mellitus with diabetic neuropathy, unspecified: Secondary | ICD-10-CM | POA: Diagnosis not present

## 2020-03-01 DIAGNOSIS — Z7982 Long term (current) use of aspirin: Secondary | ICD-10-CM | POA: Diagnosis not present

## 2020-03-01 DIAGNOSIS — E785 Hyperlipidemia, unspecified: Secondary | ICD-10-CM | POA: Diagnosis not present

## 2020-03-01 DIAGNOSIS — I10 Essential (primary) hypertension: Secondary | ICD-10-CM | POA: Diagnosis not present

## 2020-03-01 DIAGNOSIS — Z4781 Encounter for orthopedic aftercare following surgical amputation: Secondary | ICD-10-CM | POA: Diagnosis not present

## 2020-03-01 DIAGNOSIS — Z794 Long term (current) use of insulin: Secondary | ICD-10-CM | POA: Diagnosis not present

## 2020-03-01 DIAGNOSIS — G4700 Insomnia, unspecified: Secondary | ICD-10-CM | POA: Diagnosis not present

## 2020-03-01 DIAGNOSIS — F4310 Post-traumatic stress disorder, unspecified: Secondary | ICD-10-CM | POA: Diagnosis not present

## 2020-03-01 DIAGNOSIS — F329 Major depressive disorder, single episode, unspecified: Secondary | ICD-10-CM | POA: Diagnosis not present

## 2020-03-01 DIAGNOSIS — L03312 Cellulitis of back [any part except buttock]: Secondary | ICD-10-CM | POA: Diagnosis not present

## 2020-03-02 DIAGNOSIS — Z7982 Long term (current) use of aspirin: Secondary | ICD-10-CM | POA: Diagnosis not present

## 2020-03-02 DIAGNOSIS — G4700 Insomnia, unspecified: Secondary | ICD-10-CM | POA: Diagnosis not present

## 2020-03-02 DIAGNOSIS — E785 Hyperlipidemia, unspecified: Secondary | ICD-10-CM | POA: Diagnosis not present

## 2020-03-02 DIAGNOSIS — Z4781 Encounter for orthopedic aftercare following surgical amputation: Secondary | ICD-10-CM | POA: Diagnosis not present

## 2020-03-02 DIAGNOSIS — I10 Essential (primary) hypertension: Secondary | ICD-10-CM | POA: Diagnosis not present

## 2020-03-02 DIAGNOSIS — Z794 Long term (current) use of insulin: Secondary | ICD-10-CM | POA: Diagnosis not present

## 2020-03-02 DIAGNOSIS — E1140 Type 2 diabetes mellitus with diabetic neuropathy, unspecified: Secondary | ICD-10-CM | POA: Diagnosis not present

## 2020-03-02 DIAGNOSIS — F329 Major depressive disorder, single episode, unspecified: Secondary | ICD-10-CM | POA: Diagnosis not present

## 2020-03-02 DIAGNOSIS — F4310 Post-traumatic stress disorder, unspecified: Secondary | ICD-10-CM | POA: Diagnosis not present

## 2020-03-03 DIAGNOSIS — Z4781 Encounter for orthopedic aftercare following surgical amputation: Secondary | ICD-10-CM | POA: Diagnosis not present

## 2020-03-03 DIAGNOSIS — G4700 Insomnia, unspecified: Secondary | ICD-10-CM | POA: Diagnosis not present

## 2020-03-03 DIAGNOSIS — I10 Essential (primary) hypertension: Secondary | ICD-10-CM | POA: Diagnosis not present

## 2020-03-03 DIAGNOSIS — F329 Major depressive disorder, single episode, unspecified: Secondary | ICD-10-CM | POA: Diagnosis not present

## 2020-03-03 DIAGNOSIS — Z7982 Long term (current) use of aspirin: Secondary | ICD-10-CM | POA: Diagnosis not present

## 2020-03-03 DIAGNOSIS — E1140 Type 2 diabetes mellitus with diabetic neuropathy, unspecified: Secondary | ICD-10-CM | POA: Diagnosis not present

## 2020-03-03 DIAGNOSIS — E785 Hyperlipidemia, unspecified: Secondary | ICD-10-CM | POA: Diagnosis not present

## 2020-03-03 DIAGNOSIS — Z794 Long term (current) use of insulin: Secondary | ICD-10-CM | POA: Diagnosis not present

## 2020-03-03 DIAGNOSIS — F4310 Post-traumatic stress disorder, unspecified: Secondary | ICD-10-CM | POA: Diagnosis not present

## 2020-03-10 DIAGNOSIS — Z4781 Encounter for orthopedic aftercare following surgical amputation: Secondary | ICD-10-CM | POA: Diagnosis not present

## 2020-03-10 DIAGNOSIS — Z7982 Long term (current) use of aspirin: Secondary | ICD-10-CM | POA: Diagnosis not present

## 2020-03-10 DIAGNOSIS — Z794 Long term (current) use of insulin: Secondary | ICD-10-CM | POA: Diagnosis not present

## 2020-03-10 DIAGNOSIS — E785 Hyperlipidemia, unspecified: Secondary | ICD-10-CM | POA: Diagnosis not present

## 2020-03-10 DIAGNOSIS — E1140 Type 2 diabetes mellitus with diabetic neuropathy, unspecified: Secondary | ICD-10-CM | POA: Diagnosis not present

## 2020-03-10 DIAGNOSIS — I10 Essential (primary) hypertension: Secondary | ICD-10-CM | POA: Diagnosis not present

## 2020-03-10 DIAGNOSIS — F329 Major depressive disorder, single episode, unspecified: Secondary | ICD-10-CM | POA: Diagnosis not present

## 2020-03-10 DIAGNOSIS — F4310 Post-traumatic stress disorder, unspecified: Secondary | ICD-10-CM | POA: Diagnosis not present

## 2020-03-10 DIAGNOSIS — G4700 Insomnia, unspecified: Secondary | ICD-10-CM | POA: Diagnosis not present

## 2020-03-11 DIAGNOSIS — Z7982 Long term (current) use of aspirin: Secondary | ICD-10-CM | POA: Diagnosis not present

## 2020-03-11 DIAGNOSIS — E1140 Type 2 diabetes mellitus with diabetic neuropathy, unspecified: Secondary | ICD-10-CM | POA: Diagnosis not present

## 2020-03-11 DIAGNOSIS — Z794 Long term (current) use of insulin: Secondary | ICD-10-CM | POA: Diagnosis not present

## 2020-03-11 DIAGNOSIS — Z4781 Encounter for orthopedic aftercare following surgical amputation: Secondary | ICD-10-CM | POA: Diagnosis not present

## 2020-03-11 DIAGNOSIS — F329 Major depressive disorder, single episode, unspecified: Secondary | ICD-10-CM | POA: Diagnosis not present

## 2020-03-11 DIAGNOSIS — F4310 Post-traumatic stress disorder, unspecified: Secondary | ICD-10-CM | POA: Diagnosis not present

## 2020-03-11 DIAGNOSIS — E785 Hyperlipidemia, unspecified: Secondary | ICD-10-CM | POA: Diagnosis not present

## 2020-03-11 DIAGNOSIS — G4700 Insomnia, unspecified: Secondary | ICD-10-CM | POA: Diagnosis not present

## 2020-03-11 DIAGNOSIS — I10 Essential (primary) hypertension: Secondary | ICD-10-CM | POA: Diagnosis not present

## 2020-03-15 DIAGNOSIS — I10 Essential (primary) hypertension: Secondary | ICD-10-CM | POA: Diagnosis not present

## 2020-03-15 DIAGNOSIS — F4310 Post-traumatic stress disorder, unspecified: Secondary | ICD-10-CM | POA: Diagnosis not present

## 2020-03-15 DIAGNOSIS — G4700 Insomnia, unspecified: Secondary | ICD-10-CM | POA: Diagnosis not present

## 2020-03-15 DIAGNOSIS — Z794 Long term (current) use of insulin: Secondary | ICD-10-CM | POA: Diagnosis not present

## 2020-03-15 DIAGNOSIS — F329 Major depressive disorder, single episode, unspecified: Secondary | ICD-10-CM | POA: Diagnosis not present

## 2020-03-15 DIAGNOSIS — Z4781 Encounter for orthopedic aftercare following surgical amputation: Secondary | ICD-10-CM | POA: Diagnosis not present

## 2020-03-15 DIAGNOSIS — Z7982 Long term (current) use of aspirin: Secondary | ICD-10-CM | POA: Diagnosis not present

## 2020-03-15 DIAGNOSIS — E785 Hyperlipidemia, unspecified: Secondary | ICD-10-CM | POA: Diagnosis not present

## 2020-03-15 DIAGNOSIS — E1140 Type 2 diabetes mellitus with diabetic neuropathy, unspecified: Secondary | ICD-10-CM | POA: Diagnosis not present

## 2020-03-17 DIAGNOSIS — Z794 Long term (current) use of insulin: Secondary | ICD-10-CM | POA: Diagnosis not present

## 2020-03-17 DIAGNOSIS — Z7982 Long term (current) use of aspirin: Secondary | ICD-10-CM | POA: Diagnosis not present

## 2020-03-17 DIAGNOSIS — E1140 Type 2 diabetes mellitus with diabetic neuropathy, unspecified: Secondary | ICD-10-CM | POA: Diagnosis not present

## 2020-03-17 DIAGNOSIS — F4310 Post-traumatic stress disorder, unspecified: Secondary | ICD-10-CM | POA: Diagnosis not present

## 2020-03-17 DIAGNOSIS — F329 Major depressive disorder, single episode, unspecified: Secondary | ICD-10-CM | POA: Diagnosis not present

## 2020-03-17 DIAGNOSIS — G4700 Insomnia, unspecified: Secondary | ICD-10-CM | POA: Diagnosis not present

## 2020-03-17 DIAGNOSIS — E785 Hyperlipidemia, unspecified: Secondary | ICD-10-CM | POA: Diagnosis not present

## 2020-03-17 DIAGNOSIS — I10 Essential (primary) hypertension: Secondary | ICD-10-CM | POA: Diagnosis not present

## 2020-03-17 DIAGNOSIS — Z4781 Encounter for orthopedic aftercare following surgical amputation: Secondary | ICD-10-CM | POA: Diagnosis not present

## 2020-03-18 DIAGNOSIS — F4310 Post-traumatic stress disorder, unspecified: Secondary | ICD-10-CM | POA: Diagnosis not present

## 2020-03-18 DIAGNOSIS — Z7982 Long term (current) use of aspirin: Secondary | ICD-10-CM | POA: Diagnosis not present

## 2020-03-18 DIAGNOSIS — Z794 Long term (current) use of insulin: Secondary | ICD-10-CM | POA: Diagnosis not present

## 2020-03-18 DIAGNOSIS — E1140 Type 2 diabetes mellitus with diabetic neuropathy, unspecified: Secondary | ICD-10-CM | POA: Diagnosis not present

## 2020-03-18 DIAGNOSIS — E785 Hyperlipidemia, unspecified: Secondary | ICD-10-CM | POA: Diagnosis not present

## 2020-03-18 DIAGNOSIS — G4700 Insomnia, unspecified: Secondary | ICD-10-CM | POA: Diagnosis not present

## 2020-03-18 DIAGNOSIS — I10 Essential (primary) hypertension: Secondary | ICD-10-CM | POA: Diagnosis not present

## 2020-03-18 DIAGNOSIS — Z4781 Encounter for orthopedic aftercare following surgical amputation: Secondary | ICD-10-CM | POA: Diagnosis not present

## 2020-03-18 DIAGNOSIS — F329 Major depressive disorder, single episode, unspecified: Secondary | ICD-10-CM | POA: Diagnosis not present

## 2020-03-21 DIAGNOSIS — F4310 Post-traumatic stress disorder, unspecified: Secondary | ICD-10-CM | POA: Diagnosis not present

## 2020-03-21 DIAGNOSIS — E1140 Type 2 diabetes mellitus with diabetic neuropathy, unspecified: Secondary | ICD-10-CM | POA: Diagnosis not present

## 2020-03-21 DIAGNOSIS — E785 Hyperlipidemia, unspecified: Secondary | ICD-10-CM | POA: Diagnosis not present

## 2020-03-21 DIAGNOSIS — F329 Major depressive disorder, single episode, unspecified: Secondary | ICD-10-CM | POA: Diagnosis not present

## 2020-03-21 DIAGNOSIS — G4700 Insomnia, unspecified: Secondary | ICD-10-CM | POA: Diagnosis not present

## 2020-03-21 DIAGNOSIS — Z794 Long term (current) use of insulin: Secondary | ICD-10-CM | POA: Diagnosis not present

## 2020-03-21 DIAGNOSIS — Z4781 Encounter for orthopedic aftercare following surgical amputation: Secondary | ICD-10-CM | POA: Diagnosis not present

## 2020-03-21 DIAGNOSIS — I10 Essential (primary) hypertension: Secondary | ICD-10-CM | POA: Diagnosis not present

## 2020-03-21 DIAGNOSIS — Z7982 Long term (current) use of aspirin: Secondary | ICD-10-CM | POA: Diagnosis not present

## 2020-03-23 DIAGNOSIS — F4310 Post-traumatic stress disorder, unspecified: Secondary | ICD-10-CM | POA: Diagnosis not present

## 2020-03-23 DIAGNOSIS — E785 Hyperlipidemia, unspecified: Secondary | ICD-10-CM | POA: Diagnosis not present

## 2020-03-23 DIAGNOSIS — F329 Major depressive disorder, single episode, unspecified: Secondary | ICD-10-CM | POA: Diagnosis not present

## 2020-03-23 DIAGNOSIS — I10 Essential (primary) hypertension: Secondary | ICD-10-CM | POA: Diagnosis not present

## 2020-03-23 DIAGNOSIS — Z794 Long term (current) use of insulin: Secondary | ICD-10-CM | POA: Diagnosis not present

## 2020-03-23 DIAGNOSIS — Z7982 Long term (current) use of aspirin: Secondary | ICD-10-CM | POA: Diagnosis not present

## 2020-03-23 DIAGNOSIS — E1140 Type 2 diabetes mellitus with diabetic neuropathy, unspecified: Secondary | ICD-10-CM | POA: Diagnosis not present

## 2020-03-23 DIAGNOSIS — G4700 Insomnia, unspecified: Secondary | ICD-10-CM | POA: Diagnosis not present

## 2020-03-23 DIAGNOSIS — Z4781 Encounter for orthopedic aftercare following surgical amputation: Secondary | ICD-10-CM | POA: Diagnosis not present

## 2020-03-24 DIAGNOSIS — I10 Essential (primary) hypertension: Secondary | ICD-10-CM | POA: Diagnosis not present

## 2020-03-24 DIAGNOSIS — E785 Hyperlipidemia, unspecified: Secondary | ICD-10-CM | POA: Diagnosis not present

## 2020-03-24 DIAGNOSIS — Z4781 Encounter for orthopedic aftercare following surgical amputation: Secondary | ICD-10-CM | POA: Diagnosis not present

## 2020-03-24 DIAGNOSIS — E1140 Type 2 diabetes mellitus with diabetic neuropathy, unspecified: Secondary | ICD-10-CM | POA: Diagnosis not present

## 2020-03-24 DIAGNOSIS — G4700 Insomnia, unspecified: Secondary | ICD-10-CM | POA: Diagnosis not present

## 2020-03-24 DIAGNOSIS — F329 Major depressive disorder, single episode, unspecified: Secondary | ICD-10-CM | POA: Diagnosis not present

## 2020-03-24 DIAGNOSIS — Z794 Long term (current) use of insulin: Secondary | ICD-10-CM | POA: Diagnosis not present

## 2020-03-24 DIAGNOSIS — Z7982 Long term (current) use of aspirin: Secondary | ICD-10-CM | POA: Diagnosis not present

## 2020-03-24 DIAGNOSIS — F4310 Post-traumatic stress disorder, unspecified: Secondary | ICD-10-CM | POA: Diagnosis not present

## 2020-03-28 DIAGNOSIS — I10 Essential (primary) hypertension: Secondary | ICD-10-CM | POA: Diagnosis not present

## 2020-03-28 DIAGNOSIS — Z7982 Long term (current) use of aspirin: Secondary | ICD-10-CM | POA: Diagnosis not present

## 2020-03-28 DIAGNOSIS — E1140 Type 2 diabetes mellitus with diabetic neuropathy, unspecified: Secondary | ICD-10-CM | POA: Diagnosis not present

## 2020-03-28 DIAGNOSIS — E785 Hyperlipidemia, unspecified: Secondary | ICD-10-CM | POA: Diagnosis not present

## 2020-03-28 DIAGNOSIS — F4310 Post-traumatic stress disorder, unspecified: Secondary | ICD-10-CM | POA: Diagnosis not present

## 2020-03-28 DIAGNOSIS — F329 Major depressive disorder, single episode, unspecified: Secondary | ICD-10-CM | POA: Diagnosis not present

## 2020-03-28 DIAGNOSIS — Z4781 Encounter for orthopedic aftercare following surgical amputation: Secondary | ICD-10-CM | POA: Diagnosis not present

## 2020-03-28 DIAGNOSIS — Z794 Long term (current) use of insulin: Secondary | ICD-10-CM | POA: Diagnosis not present

## 2020-03-28 DIAGNOSIS — G4700 Insomnia, unspecified: Secondary | ICD-10-CM | POA: Diagnosis not present

## 2020-03-29 DIAGNOSIS — E1140 Type 2 diabetes mellitus with diabetic neuropathy, unspecified: Secondary | ICD-10-CM | POA: Diagnosis not present

## 2020-03-29 DIAGNOSIS — E785 Hyperlipidemia, unspecified: Secondary | ICD-10-CM | POA: Diagnosis not present

## 2020-03-29 DIAGNOSIS — F4310 Post-traumatic stress disorder, unspecified: Secondary | ICD-10-CM | POA: Diagnosis not present

## 2020-03-29 DIAGNOSIS — F329 Major depressive disorder, single episode, unspecified: Secondary | ICD-10-CM | POA: Diagnosis not present

## 2020-03-29 DIAGNOSIS — G4700 Insomnia, unspecified: Secondary | ICD-10-CM | POA: Diagnosis not present

## 2020-03-29 DIAGNOSIS — Z794 Long term (current) use of insulin: Secondary | ICD-10-CM | POA: Diagnosis not present

## 2020-03-29 DIAGNOSIS — I10 Essential (primary) hypertension: Secondary | ICD-10-CM | POA: Diagnosis not present

## 2020-03-29 DIAGNOSIS — Z7982 Long term (current) use of aspirin: Secondary | ICD-10-CM | POA: Diagnosis not present

## 2020-03-29 DIAGNOSIS — Z4781 Encounter for orthopedic aftercare following surgical amputation: Secondary | ICD-10-CM | POA: Diagnosis not present

## 2020-03-31 DIAGNOSIS — I10 Essential (primary) hypertension: Secondary | ICD-10-CM | POA: Diagnosis not present

## 2020-03-31 DIAGNOSIS — Z7982 Long term (current) use of aspirin: Secondary | ICD-10-CM | POA: Diagnosis not present

## 2020-03-31 DIAGNOSIS — Z794 Long term (current) use of insulin: Secondary | ICD-10-CM | POA: Diagnosis not present

## 2020-03-31 DIAGNOSIS — E1140 Type 2 diabetes mellitus with diabetic neuropathy, unspecified: Secondary | ICD-10-CM | POA: Diagnosis not present

## 2020-03-31 DIAGNOSIS — F4310 Post-traumatic stress disorder, unspecified: Secondary | ICD-10-CM | POA: Diagnosis not present

## 2020-03-31 DIAGNOSIS — E785 Hyperlipidemia, unspecified: Secondary | ICD-10-CM | POA: Diagnosis not present

## 2020-03-31 DIAGNOSIS — F329 Major depressive disorder, single episode, unspecified: Secondary | ICD-10-CM | POA: Diagnosis not present

## 2020-03-31 DIAGNOSIS — Z4781 Encounter for orthopedic aftercare following surgical amputation: Secondary | ICD-10-CM | POA: Diagnosis not present

## 2020-03-31 DIAGNOSIS — G4700 Insomnia, unspecified: Secondary | ICD-10-CM | POA: Diagnosis not present

## 2020-04-04 DIAGNOSIS — F4310 Post-traumatic stress disorder, unspecified: Secondary | ICD-10-CM | POA: Diagnosis not present

## 2020-04-04 DIAGNOSIS — I10 Essential (primary) hypertension: Secondary | ICD-10-CM | POA: Diagnosis not present

## 2020-04-04 DIAGNOSIS — E785 Hyperlipidemia, unspecified: Secondary | ICD-10-CM | POA: Diagnosis not present

## 2020-04-04 DIAGNOSIS — F329 Major depressive disorder, single episode, unspecified: Secondary | ICD-10-CM | POA: Diagnosis not present

## 2020-04-04 DIAGNOSIS — G4700 Insomnia, unspecified: Secondary | ICD-10-CM | POA: Diagnosis not present

## 2020-04-04 DIAGNOSIS — E1140 Type 2 diabetes mellitus with diabetic neuropathy, unspecified: Secondary | ICD-10-CM | POA: Diagnosis not present

## 2020-04-04 DIAGNOSIS — Z794 Long term (current) use of insulin: Secondary | ICD-10-CM | POA: Diagnosis not present

## 2020-04-04 DIAGNOSIS — Z4781 Encounter for orthopedic aftercare following surgical amputation: Secondary | ICD-10-CM | POA: Diagnosis not present

## 2020-04-04 DIAGNOSIS — Z7982 Long term (current) use of aspirin: Secondary | ICD-10-CM | POA: Diagnosis not present

## 2020-04-06 DIAGNOSIS — E785 Hyperlipidemia, unspecified: Secondary | ICD-10-CM | POA: Diagnosis not present

## 2020-04-06 DIAGNOSIS — Z794 Long term (current) use of insulin: Secondary | ICD-10-CM | POA: Diagnosis not present

## 2020-04-06 DIAGNOSIS — F4310 Post-traumatic stress disorder, unspecified: Secondary | ICD-10-CM | POA: Diagnosis not present

## 2020-04-06 DIAGNOSIS — I10 Essential (primary) hypertension: Secondary | ICD-10-CM | POA: Diagnosis not present

## 2020-04-06 DIAGNOSIS — G4700 Insomnia, unspecified: Secondary | ICD-10-CM | POA: Diagnosis not present

## 2020-04-06 DIAGNOSIS — Z4781 Encounter for orthopedic aftercare following surgical amputation: Secondary | ICD-10-CM | POA: Diagnosis not present

## 2020-04-06 DIAGNOSIS — F329 Major depressive disorder, single episode, unspecified: Secondary | ICD-10-CM | POA: Diagnosis not present

## 2020-04-06 DIAGNOSIS — Z7982 Long term (current) use of aspirin: Secondary | ICD-10-CM | POA: Diagnosis not present

## 2020-04-06 DIAGNOSIS — E1140 Type 2 diabetes mellitus with diabetic neuropathy, unspecified: Secondary | ICD-10-CM | POA: Diagnosis not present

## 2020-04-08 DIAGNOSIS — Z7982 Long term (current) use of aspirin: Secondary | ICD-10-CM | POA: Diagnosis not present

## 2020-04-08 DIAGNOSIS — Z4781 Encounter for orthopedic aftercare following surgical amputation: Secondary | ICD-10-CM | POA: Diagnosis not present

## 2020-04-08 DIAGNOSIS — G4700 Insomnia, unspecified: Secondary | ICD-10-CM | POA: Diagnosis not present

## 2020-04-08 DIAGNOSIS — I10 Essential (primary) hypertension: Secondary | ICD-10-CM | POA: Diagnosis not present

## 2020-04-08 DIAGNOSIS — Z794 Long term (current) use of insulin: Secondary | ICD-10-CM | POA: Diagnosis not present

## 2020-04-08 DIAGNOSIS — F4310 Post-traumatic stress disorder, unspecified: Secondary | ICD-10-CM | POA: Diagnosis not present

## 2020-04-08 DIAGNOSIS — E1140 Type 2 diabetes mellitus with diabetic neuropathy, unspecified: Secondary | ICD-10-CM | POA: Diagnosis not present

## 2020-04-08 DIAGNOSIS — E785 Hyperlipidemia, unspecified: Secondary | ICD-10-CM | POA: Diagnosis not present

## 2020-04-08 DIAGNOSIS — F329 Major depressive disorder, single episode, unspecified: Secondary | ICD-10-CM | POA: Diagnosis not present

## 2020-04-14 DIAGNOSIS — Z794 Long term (current) use of insulin: Secondary | ICD-10-CM | POA: Diagnosis not present

## 2020-04-14 DIAGNOSIS — I10 Essential (primary) hypertension: Secondary | ICD-10-CM | POA: Diagnosis not present

## 2020-04-14 DIAGNOSIS — Z4781 Encounter for orthopedic aftercare following surgical amputation: Secondary | ICD-10-CM | POA: Diagnosis not present

## 2020-04-14 DIAGNOSIS — F4310 Post-traumatic stress disorder, unspecified: Secondary | ICD-10-CM | POA: Diagnosis not present

## 2020-04-14 DIAGNOSIS — E785 Hyperlipidemia, unspecified: Secondary | ICD-10-CM | POA: Diagnosis not present

## 2020-04-14 DIAGNOSIS — Z7982 Long term (current) use of aspirin: Secondary | ICD-10-CM | POA: Diagnosis not present

## 2020-04-14 DIAGNOSIS — G4700 Insomnia, unspecified: Secondary | ICD-10-CM | POA: Diagnosis not present

## 2020-04-14 DIAGNOSIS — E1140 Type 2 diabetes mellitus with diabetic neuropathy, unspecified: Secondary | ICD-10-CM | POA: Diagnosis not present

## 2020-04-14 DIAGNOSIS — F329 Major depressive disorder, single episode, unspecified: Secondary | ICD-10-CM | POA: Diagnosis not present

## 2020-04-15 DIAGNOSIS — G4700 Insomnia, unspecified: Secondary | ICD-10-CM | POA: Diagnosis not present

## 2020-04-15 DIAGNOSIS — E1140 Type 2 diabetes mellitus with diabetic neuropathy, unspecified: Secondary | ICD-10-CM | POA: Diagnosis not present

## 2020-04-15 DIAGNOSIS — F329 Major depressive disorder, single episode, unspecified: Secondary | ICD-10-CM | POA: Diagnosis not present

## 2020-04-15 DIAGNOSIS — F4310 Post-traumatic stress disorder, unspecified: Secondary | ICD-10-CM | POA: Diagnosis not present

## 2020-04-15 DIAGNOSIS — I10 Essential (primary) hypertension: Secondary | ICD-10-CM | POA: Diagnosis not present

## 2020-04-15 DIAGNOSIS — E785 Hyperlipidemia, unspecified: Secondary | ICD-10-CM | POA: Diagnosis not present

## 2020-04-15 DIAGNOSIS — Z7982 Long term (current) use of aspirin: Secondary | ICD-10-CM | POA: Diagnosis not present

## 2020-04-15 DIAGNOSIS — Z794 Long term (current) use of insulin: Secondary | ICD-10-CM | POA: Diagnosis not present

## 2020-04-15 DIAGNOSIS — Z4781 Encounter for orthopedic aftercare following surgical amputation: Secondary | ICD-10-CM | POA: Diagnosis not present

## 2020-04-17 DIAGNOSIS — F4310 Post-traumatic stress disorder, unspecified: Secondary | ICD-10-CM | POA: Diagnosis not present

## 2020-04-17 DIAGNOSIS — E1140 Type 2 diabetes mellitus with diabetic neuropathy, unspecified: Secondary | ICD-10-CM | POA: Diagnosis not present

## 2020-04-17 DIAGNOSIS — F329 Major depressive disorder, single episode, unspecified: Secondary | ICD-10-CM | POA: Diagnosis not present

## 2020-04-17 DIAGNOSIS — Z794 Long term (current) use of insulin: Secondary | ICD-10-CM | POA: Diagnosis not present

## 2020-04-17 DIAGNOSIS — Z7982 Long term (current) use of aspirin: Secondary | ICD-10-CM | POA: Diagnosis not present

## 2020-04-17 DIAGNOSIS — Z4781 Encounter for orthopedic aftercare following surgical amputation: Secondary | ICD-10-CM | POA: Diagnosis not present

## 2020-04-17 DIAGNOSIS — G4700 Insomnia, unspecified: Secondary | ICD-10-CM | POA: Diagnosis not present

## 2020-04-17 DIAGNOSIS — E785 Hyperlipidemia, unspecified: Secondary | ICD-10-CM | POA: Diagnosis not present

## 2020-04-17 DIAGNOSIS — I10 Essential (primary) hypertension: Secondary | ICD-10-CM | POA: Diagnosis not present

## 2020-04-18 DIAGNOSIS — Z7982 Long term (current) use of aspirin: Secondary | ICD-10-CM | POA: Diagnosis not present

## 2020-04-18 DIAGNOSIS — F4310 Post-traumatic stress disorder, unspecified: Secondary | ICD-10-CM | POA: Diagnosis not present

## 2020-04-18 DIAGNOSIS — I10 Essential (primary) hypertension: Secondary | ICD-10-CM | POA: Diagnosis not present

## 2020-04-18 DIAGNOSIS — Z794 Long term (current) use of insulin: Secondary | ICD-10-CM | POA: Diagnosis not present

## 2020-04-18 DIAGNOSIS — Z4781 Encounter for orthopedic aftercare following surgical amputation: Secondary | ICD-10-CM | POA: Diagnosis not present

## 2020-04-18 DIAGNOSIS — G4700 Insomnia, unspecified: Secondary | ICD-10-CM | POA: Diagnosis not present

## 2020-04-18 DIAGNOSIS — E785 Hyperlipidemia, unspecified: Secondary | ICD-10-CM | POA: Diagnosis not present

## 2020-04-18 DIAGNOSIS — F329 Major depressive disorder, single episode, unspecified: Secondary | ICD-10-CM | POA: Diagnosis not present

## 2020-04-18 DIAGNOSIS — E1140 Type 2 diabetes mellitus with diabetic neuropathy, unspecified: Secondary | ICD-10-CM | POA: Diagnosis not present

## 2020-04-19 DIAGNOSIS — G4700 Insomnia, unspecified: Secondary | ICD-10-CM | POA: Diagnosis not present

## 2020-04-19 DIAGNOSIS — E1140 Type 2 diabetes mellitus with diabetic neuropathy, unspecified: Secondary | ICD-10-CM | POA: Diagnosis not present

## 2020-04-19 DIAGNOSIS — I10 Essential (primary) hypertension: Secondary | ICD-10-CM | POA: Diagnosis not present

## 2020-04-19 DIAGNOSIS — Z4781 Encounter for orthopedic aftercare following surgical amputation: Secondary | ICD-10-CM | POA: Diagnosis not present

## 2020-04-19 DIAGNOSIS — Z7982 Long term (current) use of aspirin: Secondary | ICD-10-CM | POA: Diagnosis not present

## 2020-04-19 DIAGNOSIS — F329 Major depressive disorder, single episode, unspecified: Secondary | ICD-10-CM | POA: Diagnosis not present

## 2020-04-19 DIAGNOSIS — E785 Hyperlipidemia, unspecified: Secondary | ICD-10-CM | POA: Diagnosis not present

## 2020-04-19 DIAGNOSIS — Z794 Long term (current) use of insulin: Secondary | ICD-10-CM | POA: Diagnosis not present

## 2020-04-19 DIAGNOSIS — F4310 Post-traumatic stress disorder, unspecified: Secondary | ICD-10-CM | POA: Diagnosis not present

## 2020-04-20 DIAGNOSIS — I10 Essential (primary) hypertension: Secondary | ICD-10-CM | POA: Diagnosis not present

## 2020-04-20 DIAGNOSIS — Z4781 Encounter for orthopedic aftercare following surgical amputation: Secondary | ICD-10-CM | POA: Diagnosis not present

## 2020-04-20 DIAGNOSIS — F4310 Post-traumatic stress disorder, unspecified: Secondary | ICD-10-CM | POA: Diagnosis not present

## 2020-04-20 DIAGNOSIS — G4700 Insomnia, unspecified: Secondary | ICD-10-CM | POA: Diagnosis not present

## 2020-04-20 DIAGNOSIS — Z7982 Long term (current) use of aspirin: Secondary | ICD-10-CM | POA: Diagnosis not present

## 2020-04-20 DIAGNOSIS — E785 Hyperlipidemia, unspecified: Secondary | ICD-10-CM | POA: Diagnosis not present

## 2020-04-20 DIAGNOSIS — F329 Major depressive disorder, single episode, unspecified: Secondary | ICD-10-CM | POA: Diagnosis not present

## 2020-04-20 DIAGNOSIS — Z794 Long term (current) use of insulin: Secondary | ICD-10-CM | POA: Diagnosis not present

## 2020-04-20 DIAGNOSIS — E1140 Type 2 diabetes mellitus with diabetic neuropathy, unspecified: Secondary | ICD-10-CM | POA: Diagnosis not present

## 2020-04-25 DIAGNOSIS — F4310 Post-traumatic stress disorder, unspecified: Secondary | ICD-10-CM | POA: Diagnosis not present

## 2020-04-25 DIAGNOSIS — I10 Essential (primary) hypertension: Secondary | ICD-10-CM | POA: Diagnosis not present

## 2020-04-25 DIAGNOSIS — Z794 Long term (current) use of insulin: Secondary | ICD-10-CM | POA: Diagnosis not present

## 2020-04-25 DIAGNOSIS — F329 Major depressive disorder, single episode, unspecified: Secondary | ICD-10-CM | POA: Diagnosis not present

## 2020-04-25 DIAGNOSIS — G4700 Insomnia, unspecified: Secondary | ICD-10-CM | POA: Diagnosis not present

## 2020-04-25 DIAGNOSIS — Z7982 Long term (current) use of aspirin: Secondary | ICD-10-CM | POA: Diagnosis not present

## 2020-04-25 DIAGNOSIS — E1140 Type 2 diabetes mellitus with diabetic neuropathy, unspecified: Secondary | ICD-10-CM | POA: Diagnosis not present

## 2020-04-25 DIAGNOSIS — E785 Hyperlipidemia, unspecified: Secondary | ICD-10-CM | POA: Diagnosis not present

## 2020-04-25 DIAGNOSIS — Z4781 Encounter for orthopedic aftercare following surgical amputation: Secondary | ICD-10-CM | POA: Diagnosis not present

## 2020-04-26 DIAGNOSIS — Z4781 Encounter for orthopedic aftercare following surgical amputation: Secondary | ICD-10-CM | POA: Diagnosis not present

## 2020-04-26 DIAGNOSIS — E782 Mixed hyperlipidemia: Secondary | ICD-10-CM | POA: Diagnosis not present

## 2020-04-26 DIAGNOSIS — F331 Major depressive disorder, recurrent, moderate: Secondary | ICD-10-CM | POA: Diagnosis not present

## 2020-04-26 DIAGNOSIS — E119 Type 2 diabetes mellitus without complications: Secondary | ICD-10-CM | POA: Diagnosis not present

## 2020-04-26 DIAGNOSIS — Z89619 Acquired absence of unspecified leg above knee: Secondary | ICD-10-CM | POA: Diagnosis not present

## 2020-04-26 DIAGNOSIS — F4310 Post-traumatic stress disorder, unspecified: Secondary | ICD-10-CM | POA: Diagnosis not present

## 2020-04-26 DIAGNOSIS — E785 Hyperlipidemia, unspecified: Secondary | ICD-10-CM | POA: Diagnosis not present

## 2020-04-26 DIAGNOSIS — E1140 Type 2 diabetes mellitus with diabetic neuropathy, unspecified: Secondary | ICD-10-CM | POA: Diagnosis not present

## 2020-04-26 DIAGNOSIS — Z7982 Long term (current) use of aspirin: Secondary | ICD-10-CM | POA: Diagnosis not present

## 2020-04-26 DIAGNOSIS — G4700 Insomnia, unspecified: Secondary | ICD-10-CM | POA: Diagnosis not present

## 2020-04-26 DIAGNOSIS — E1121 Type 2 diabetes mellitus with diabetic nephropathy: Secondary | ICD-10-CM | POA: Diagnosis not present

## 2020-04-26 DIAGNOSIS — I739 Peripheral vascular disease, unspecified: Secondary | ICD-10-CM | POA: Diagnosis not present

## 2020-04-26 DIAGNOSIS — Z794 Long term (current) use of insulin: Secondary | ICD-10-CM | POA: Diagnosis not present

## 2020-04-26 DIAGNOSIS — I10 Essential (primary) hypertension: Secondary | ICD-10-CM | POA: Diagnosis not present

## 2020-04-26 DIAGNOSIS — F329 Major depressive disorder, single episode, unspecified: Secondary | ICD-10-CM | POA: Diagnosis not present

## 2020-04-27 ENCOUNTER — Encounter: Payer: Self-pay | Admitting: Neurology

## 2020-04-28 DIAGNOSIS — E785 Hyperlipidemia, unspecified: Secondary | ICD-10-CM | POA: Diagnosis not present

## 2020-04-28 DIAGNOSIS — Z794 Long term (current) use of insulin: Secondary | ICD-10-CM | POA: Diagnosis not present

## 2020-04-28 DIAGNOSIS — G4700 Insomnia, unspecified: Secondary | ICD-10-CM | POA: Diagnosis not present

## 2020-04-28 DIAGNOSIS — E1140 Type 2 diabetes mellitus with diabetic neuropathy, unspecified: Secondary | ICD-10-CM | POA: Diagnosis not present

## 2020-04-28 DIAGNOSIS — Z4781 Encounter for orthopedic aftercare following surgical amputation: Secondary | ICD-10-CM | POA: Diagnosis not present

## 2020-04-28 DIAGNOSIS — Z7982 Long term (current) use of aspirin: Secondary | ICD-10-CM | POA: Diagnosis not present

## 2020-04-28 DIAGNOSIS — F329 Major depressive disorder, single episode, unspecified: Secondary | ICD-10-CM | POA: Diagnosis not present

## 2020-04-28 DIAGNOSIS — F4310 Post-traumatic stress disorder, unspecified: Secondary | ICD-10-CM | POA: Diagnosis not present

## 2020-04-28 DIAGNOSIS — I10 Essential (primary) hypertension: Secondary | ICD-10-CM | POA: Diagnosis not present

## 2020-05-03 DIAGNOSIS — Z7982 Long term (current) use of aspirin: Secondary | ICD-10-CM | POA: Diagnosis not present

## 2020-05-03 DIAGNOSIS — F329 Major depressive disorder, single episode, unspecified: Secondary | ICD-10-CM | POA: Diagnosis not present

## 2020-05-03 DIAGNOSIS — I10 Essential (primary) hypertension: Secondary | ICD-10-CM | POA: Diagnosis not present

## 2020-05-03 DIAGNOSIS — F4310 Post-traumatic stress disorder, unspecified: Secondary | ICD-10-CM | POA: Diagnosis not present

## 2020-05-03 DIAGNOSIS — E1140 Type 2 diabetes mellitus with diabetic neuropathy, unspecified: Secondary | ICD-10-CM | POA: Diagnosis not present

## 2020-05-03 DIAGNOSIS — Z4781 Encounter for orthopedic aftercare following surgical amputation: Secondary | ICD-10-CM | POA: Diagnosis not present

## 2020-05-03 DIAGNOSIS — Z794 Long term (current) use of insulin: Secondary | ICD-10-CM | POA: Diagnosis not present

## 2020-05-03 DIAGNOSIS — G4700 Insomnia, unspecified: Secondary | ICD-10-CM | POA: Diagnosis not present

## 2020-05-03 DIAGNOSIS — E785 Hyperlipidemia, unspecified: Secondary | ICD-10-CM | POA: Diagnosis not present

## 2020-05-04 DIAGNOSIS — I10 Essential (primary) hypertension: Secondary | ICD-10-CM | POA: Diagnosis not present

## 2020-05-04 DIAGNOSIS — F329 Major depressive disorder, single episode, unspecified: Secondary | ICD-10-CM | POA: Diagnosis not present

## 2020-05-04 DIAGNOSIS — F4310 Post-traumatic stress disorder, unspecified: Secondary | ICD-10-CM | POA: Diagnosis not present

## 2020-05-04 DIAGNOSIS — Z7982 Long term (current) use of aspirin: Secondary | ICD-10-CM | POA: Diagnosis not present

## 2020-05-04 DIAGNOSIS — Z794 Long term (current) use of insulin: Secondary | ICD-10-CM | POA: Diagnosis not present

## 2020-05-04 DIAGNOSIS — Z4781 Encounter for orthopedic aftercare following surgical amputation: Secondary | ICD-10-CM | POA: Diagnosis not present

## 2020-05-04 DIAGNOSIS — E1140 Type 2 diabetes mellitus with diabetic neuropathy, unspecified: Secondary | ICD-10-CM | POA: Diagnosis not present

## 2020-05-04 DIAGNOSIS — E785 Hyperlipidemia, unspecified: Secondary | ICD-10-CM | POA: Diagnosis not present

## 2020-05-04 DIAGNOSIS — G4700 Insomnia, unspecified: Secondary | ICD-10-CM | POA: Diagnosis not present

## 2020-05-06 DIAGNOSIS — Z7982 Long term (current) use of aspirin: Secondary | ICD-10-CM | POA: Diagnosis not present

## 2020-05-06 DIAGNOSIS — I10 Essential (primary) hypertension: Secondary | ICD-10-CM | POA: Diagnosis not present

## 2020-05-06 DIAGNOSIS — E1140 Type 2 diabetes mellitus with diabetic neuropathy, unspecified: Secondary | ICD-10-CM | POA: Diagnosis not present

## 2020-05-06 DIAGNOSIS — F4310 Post-traumatic stress disorder, unspecified: Secondary | ICD-10-CM | POA: Diagnosis not present

## 2020-05-06 DIAGNOSIS — Z794 Long term (current) use of insulin: Secondary | ICD-10-CM | POA: Diagnosis not present

## 2020-05-06 DIAGNOSIS — G4700 Insomnia, unspecified: Secondary | ICD-10-CM | POA: Diagnosis not present

## 2020-05-06 DIAGNOSIS — Z4781 Encounter for orthopedic aftercare following surgical amputation: Secondary | ICD-10-CM | POA: Diagnosis not present

## 2020-05-06 DIAGNOSIS — E785 Hyperlipidemia, unspecified: Secondary | ICD-10-CM | POA: Diagnosis not present

## 2020-05-06 DIAGNOSIS — F329 Major depressive disorder, single episode, unspecified: Secondary | ICD-10-CM | POA: Diagnosis not present

## 2020-05-09 DIAGNOSIS — Z4781 Encounter for orthopedic aftercare following surgical amputation: Secondary | ICD-10-CM | POA: Diagnosis not present

## 2020-05-09 DIAGNOSIS — F329 Major depressive disorder, single episode, unspecified: Secondary | ICD-10-CM | POA: Diagnosis not present

## 2020-05-09 DIAGNOSIS — E785 Hyperlipidemia, unspecified: Secondary | ICD-10-CM | POA: Diagnosis not present

## 2020-05-09 DIAGNOSIS — E1140 Type 2 diabetes mellitus with diabetic neuropathy, unspecified: Secondary | ICD-10-CM | POA: Diagnosis not present

## 2020-05-09 DIAGNOSIS — I10 Essential (primary) hypertension: Secondary | ICD-10-CM | POA: Diagnosis not present

## 2020-05-09 DIAGNOSIS — Z794 Long term (current) use of insulin: Secondary | ICD-10-CM | POA: Diagnosis not present

## 2020-05-09 DIAGNOSIS — F4310 Post-traumatic stress disorder, unspecified: Secondary | ICD-10-CM | POA: Diagnosis not present

## 2020-05-09 DIAGNOSIS — Z7982 Long term (current) use of aspirin: Secondary | ICD-10-CM | POA: Diagnosis not present

## 2020-05-09 DIAGNOSIS — G4700 Insomnia, unspecified: Secondary | ICD-10-CM | POA: Diagnosis not present

## 2020-05-12 DIAGNOSIS — F329 Major depressive disorder, single episode, unspecified: Secondary | ICD-10-CM | POA: Diagnosis not present

## 2020-05-12 DIAGNOSIS — Z7982 Long term (current) use of aspirin: Secondary | ICD-10-CM | POA: Diagnosis not present

## 2020-05-12 DIAGNOSIS — E1140 Type 2 diabetes mellitus with diabetic neuropathy, unspecified: Secondary | ICD-10-CM | POA: Diagnosis not present

## 2020-05-12 DIAGNOSIS — F4310 Post-traumatic stress disorder, unspecified: Secondary | ICD-10-CM | POA: Diagnosis not present

## 2020-05-12 DIAGNOSIS — Z794 Long term (current) use of insulin: Secondary | ICD-10-CM | POA: Diagnosis not present

## 2020-05-12 DIAGNOSIS — G4700 Insomnia, unspecified: Secondary | ICD-10-CM | POA: Diagnosis not present

## 2020-05-12 DIAGNOSIS — E785 Hyperlipidemia, unspecified: Secondary | ICD-10-CM | POA: Diagnosis not present

## 2020-05-12 DIAGNOSIS — Z4781 Encounter for orthopedic aftercare following surgical amputation: Secondary | ICD-10-CM | POA: Diagnosis not present

## 2020-05-12 DIAGNOSIS — I10 Essential (primary) hypertension: Secondary | ICD-10-CM | POA: Diagnosis not present

## 2020-05-30 ENCOUNTER — Other Ambulatory Visit: Payer: Self-pay

## 2020-05-30 ENCOUNTER — Ambulatory Visit: Payer: Medicare HMO | Admitting: Physical Therapy

## 2020-05-30 ENCOUNTER — Encounter: Payer: Self-pay | Admitting: Physical Therapy

## 2020-05-30 DIAGNOSIS — R2681 Unsteadiness on feet: Secondary | ICD-10-CM | POA: Diagnosis not present

## 2020-05-30 DIAGNOSIS — R293 Abnormal posture: Secondary | ICD-10-CM | POA: Diagnosis not present

## 2020-05-30 DIAGNOSIS — M79641 Pain in right hand: Secondary | ICD-10-CM | POA: Diagnosis not present

## 2020-05-30 DIAGNOSIS — M25652 Stiffness of left hip, not elsewhere classified: Secondary | ICD-10-CM

## 2020-05-30 DIAGNOSIS — R2689 Other abnormalities of gait and mobility: Secondary | ICD-10-CM | POA: Diagnosis not present

## 2020-05-30 DIAGNOSIS — M6281 Muscle weakness (generalized): Secondary | ICD-10-CM | POA: Diagnosis not present

## 2020-05-31 NOTE — Therapy (Addendum)
[facility] Physical Therapy [address] [place], [place], [address] Phone: [phone]   Fax:  [phone]  Physical Therapy Evaluation  Patient Details  Name: KIMARIA STRUTHERS MRN: [id number] Date of Birth: 1956/04/28 Referring Provider (PT): [name], MD   Encounter Date: 05/30/2020   PT End of Session - 05/30/20 1415    Visit Number 1    Number of Visits 52    Date for PT Re-Evaluation 08/26/20    Authorization Type [employer]    Authorization Time Period $10 co-pay,    PT Start Time 1307    PT Stop Time 1350    PT Time Calculation (min) 43 min    Equipment Utilized During Treatment Gait belt    Activity Tolerance Patient tolerated treatment well    Behavior During Therapy Flat affect;WFL for tasks assessed/performed           Past Medical History:  Diagnosis Date  . Allergic rhinitis   . Depression   . Diabetes (HCC)   . Dyslipidemia   . HTN (hypertension)   . Insomnia   . OSA (obstructive sleep apnea)    no cpap  . Urinary incontinence     Past Surgical History:  Procedure Laterality Date  . ABDOMINAL AORTOGRAM W/LOWER EXTREMITY N/A 06/24/2017   Procedure: ABDOMINAL AORTOGRAM W/LOWER EXTREMITY;  Surgeon: Maeola Harman, MD;  Location: [facility] INVASIVE CV LAB;  Service: Cardiovascular;  Laterality: N/A;  . ABDOMINAL AORTOGRAM W/LOWER EXTREMITY Right 10/09/2017   Procedure: ABDOMINAL AORTOGRAM W/LOWER EXTREMITY;  Surgeon: Maeola Harman, MD;  Location: [facility] INVASIVE CV LAB;  Service: Cardiovascular;  Laterality: Right;  . AMPUTATION Left 07/23/2017   Procedure: AMPUTATION  BELOW KNEE;  Surgeon: Maeola Harman, MD;  Location: [facility] OR;  Service: Vascular;  Laterality: Left;  . AMPUTATION Left 08/12/2017   Procedure: REVISION BELOW KNEE;  Surgeon: Maeola Harman, MD;  Location: [facility] OR;  Service: Vascular;  Laterality: Left;  . AMPUTATION Left 08/27/2017   Procedure: left ABOVE KNEE AMPUTATION;  Surgeon:  Maeola Harman, MD;  Location: [facility] OR;  Service: Vascular;  Laterality: Left;  . AMPUTATION Left 08/30/2017   Procedure: REVISION AMPUTATION ABOVE KNEE;  Surgeon: Nada Libman, MD;  Location: [facility] OR;  Service: Vascular;  Laterality: Left;  . AMPUTATION Right 11/04/2017   Procedure: AMPUTATION RIGHT FOURTH TOE;  Surgeon: Maeola Harman, MD;  Location: [facility] OR;  Service: Vascular;  Laterality: Right;  . AMPUTATION Right 11/12/2018   Procedure: AMPUTATION OF second, third and fifth toes;  Surgeon: Nada Libman, MD;  Location: [facility] OR;  Service: Vascular;  Laterality: Right;  . AMPUTATION Right 01/19/2019   Procedure: AMPUTATION BELOW KNEE;  Surgeon: Sherren Kerns, MD;  Location: [facility] OR;  Service: Vascular;  Laterality: Right;  . APPLICATION OF WOUND VAC Left 08/27/2017   Procedure: APPLICATION OF WOUND VAC;  Surgeon: Maeola Harman, MD;  Location: [facility] OR;  Service: Vascular;  Laterality: Left;  . CARPAL TUNNEL RELEASE Right   . COLONOSCOPY    . LOWER EXTREMITY ANGIOGRAM Right 11/12/2018   Procedure: ANGIOGRAM OF Right LEG;  Surgeon: Nada Libman, MD;  Location: [facility] OR;  Service: Vascular;  Laterality: Right;  . LOWER EXTREMITY INTERVENTION Right 10/09/2017   Procedure: LOWER EXTREMITY INTERVENTION;  Surgeon: Maeola Harman, MD;  Location: [facility] INVASIVE CV LAB;  Service: Cardiovascular;  Laterality: Right;  . TOE AMPUTATION Right 11/12/2018   2ND 3RD & 5TH TOE   . TRANSMETATARSAL AMPUTATION Right  11/19/2018   Procedure: TRANSMETATARSAL AMPUTATION REVISION;  Surgeon: Nada Libman, MD;  Location: [facility] OR;  Service: Vascular;  Laterality: Right;    There were no vitals filed for this visit.    Subjective Assessment - 05/30/20 1312    Subjective This 64yo female was referred to PT on 05/19/2020 by [name], MD. She underwent a right Transtibial Amputation on 01/19/2019 due to non-healing Transmetatarsal Amputation.  She has history of left Transfemoral  Amputation on 08/27/2017. She recieved her Transtibial Amputation in Spring 2021 after lengthy wound care. She has not had socket revision for Transfemoral prosthesis.  HHPT discharged her end of July 2021who was working on transfers with BKA side.    Patient Stated Goals Walk with 2 prostheses in home mostly and maybe limited community.    Currently in Pain? Yes    Pain Score 7    in last week, worst 9/10 and best 4/10   Pain Location Hand    Pain Orientation Right;Left   mostly right   Pain Descriptors / Indicators Sharp;Aching    Pain Type Chronic pain    Pain Onset More than a month ago    Pain Frequency Constant    Aggravating Factors  use right hand for transfers & walking    Pain Relieving Factors parafin wax,              [facility] PT Assessment - 05/30/20 1310      Assessment   Medical Diagnosis Right Transtibial Amputation Z89.511  & Hx Left Transfermoral Amputation  Z61.096    Referring Provider (PT) [name], MD    Onset Date/Surgical Date 05/19/20   MD referral to PT   Hand Dominance Right    Prior Therapy HHPT thru end of July 2021      Precautions   Precautions Fall      Balance Screen   Has the patient fallen in the past 6 months No    Has the patient had a decrease in activity level because of a fear of falling?  Yes    Is the patient reluctant to leave their home because of a fear of falling?  Yes      Home Environment   Living Environment Private residence    Living Arrangements Alone    Type of Home Apartment    Home Access Level entry    Home Layout One level    Home Equipment Walker - 2 wheels;Bedside commode;Tub bench;Hand held shower head;Wheelchair - IT trainer      Prior Function   Level of Independence Needs assistance with gait;Independent with transfers   was ambulating w/family supervision in apt & basic community   Vocation On disability      Posture/Postural Control   Posture/Postural Control Postural limitations    Postural  Limitations Rounded Shoulders;Forward head;Flexed trunk;Weight shift right      ROM / Strength   AROM / PROM / Strength PROM;Strength      PROM   Overall PROM  Deficits    Overall PROM Comments Left knee extension 0*  Hip extension in standing grossly -30* bilaterally      Strength   Overall Strength Deficits    Overall Strength Comments BUEs grossly 5/5 except right grip fair    Strength Assessment Site Hip;Knee    Right Hip Flexion 4/5    Right Hip Extension 3-/5   gross MMT sitting & functional standing   Right Hip ABduction 3/5   gross MMT sitting &  functional standing   Left Hip Flexion 4/5    Left Hip Extension 3-/5   gross MMT sitting & functional standing   Left Hip ABduction 3/5   gross MMT sitting & functional standing   Right Knee Flexion 3+/5   gross MMT sitting & functional standing   Right Knee Extension 3/5   gross MMT sitting & functional standing     Transfers   Transfers Sit to Stand;Stand to Sit;Lateral/Scoot Transfers    Sit to Stand 3: Mod assist;With upper extremity assist;With armrests;From chair/3-in-1;Other (comment)   pulling on //bars, TFA prosthetic knee locking upon arising   Stand to Sit 3: Mod assist;With upper extremity assist;With armrests;To chair/3-in-1;Other (comment)   using //bars to lower into w/c, prosthetic knee locked   Lateral/Scoot Transfers 4: Min assist;With armrests removed;Other (comment)   with TTA prosthesis only   Lateral/Scoot Transfer Details (indicate cue type and reason) reports with sliding board no assistance required      Ambulation/Gait   Ambulation/Gait Yes    Ambulation/Gait Assistance 2: Max assist    Ambulation/Gait Assistance Details excessive UE weight bearing on //bars, PT manually controlling left TFA movement and blocking / extending right knee in stance     Ambulation Distance (Feet) 2 Feet    Assistive device Parallel bars;Prostheses   right TTA & left TFA locked knee prostheses   Gait Pattern Step-to  pattern;Decreased step length - right;Decreased stance time - left;Decreased stride length;Antalgic;Lateral hip instability;Trunk flexed;Wide base of support;Abducted - left;Poor foot clearance - left;Poor foot clearance - right    Ambulation Surface Level;Indoor      Balance   Balance Assessed Yes      Static Sitting Balance   Static Sitting - Balance Support Feet supported;No upper extremity supported    Static Sitting - Level of Assistance 6: Modified independent (Device/Increase time)      Dynamic Sitting Balance   Dynamic Sitting - Balance Support Left upper extremity supported;Feet supported;During functional activity    Dynamic Sitting - Level of Assistance 6: Modified independent (Device/Increase time)    Reach (Patient is able to reach ___ inches to right, left, forward, back) 5      Static Standing Balance   Static Standing - Balance Support Bilateral upper extremity supported    Static Standing - Level of Assistance 3: Mod assist   PT manually positioning pelvis over feet & trunk extension   Static Standing - Comment/# of Minutes 1 minute      Dynamic Standing Balance   Dynamic Standing - Balance Support Left upper extremity supported;During functional activity   //bars, locked TFA knee & PT blocking / extending right knee   Dynamic Standing - Level of Assistance 3: Mod assist    Dynamic Standing - Comments Patient able to touch PT shoulder who was standing directly in front of pt. She was only able to release bar for <3seconds.            Prosthetics Assessment - 05/30/20 1310      Prosthetics   Prosthetic Care Dependent with Skin check;Residual limb care;Prosthetic cleaning;Correct ply sock adjustment;Proper wear schedule/adjustment;Proper weight-bearing schedule/adjustment    Donning prosthesis  Mod assist;Min assist   ModA TFA prosthesis, MinA TTA prosthesis & TFA liner   Doffing prosthesis  Supervision    Current prosthetic wear tolerance (days/week)  TTA prosthesis  2 days last week, but skin changes so none.  She only wore TTA prosthesis with HHPT prior to last week.  TFA prosthesis not  since TTA 01/19/2019. Started wearing TFA liner only daily for last week.      Current prosthetic wear tolerance (#hours/day)  last week tried wearing TTA liner for 4-6 hours but deep red rash on limb so stopped.     Current prosthetic weight-bearing tolerance (hours/day)  No pain or discomfort with standing in //bars.     Edema pitting edema TTA limb    Residual limb condition  right TTA has deep red rash over distal limb but no signs of infection.  bulbous edematous shape.  no open areas.    TFA no issues noted.     Prosthesis Description TTA prosthesis silicon liner with shuttle pin lock suspension, SACH foot.   TFA prosthesis manual locked multiaxial knee, single axis foot, silicon liner with velcro lanyard suspension.      K code/activity level with prosthetic use  K2 limited community with fixed cadence  Prosthesis has manual lock knee posteriorly offset, single axis foot, silicon liner with velcro lateral suspension.                       Objective measurements completed on examination: See above findings.       [facility] Adult PT Treatment/Exercise - 05/30/20 1310      Prosthetics   Prosthetic Care Comments  wear shrinker 24hrs/day to decrease edema in TTA limb.     Education Provided Skin check;Residual limb care    Person(s) Educated Patient;Caregiver(s)    Education Method Explanation;Verbal cues    Education Method Verbalized understanding;Verbal cues required;Needs further instruction                    PT Short Term Goals - 05/30/20 1611      PT SHORT TERM GOAL #1   Title Patient tolerates wearing TTA prosthesis >4 hrs total / day without increase in skin issues. (All STGs Target Date: 06/30/2020)    Time 1    Period Months    Status New    Target Date 06/30/20      PT SHORT TERM GOAL #2   Title Patient donnes transtibial  prosthesis with minimal assist.    Time 1    Period Months    Status New    Target Date 06/30/20      PT SHORT TERM GOAL #3   Title Sit to stand pulling on parallel bars with moderate assist.    Time 1    Period Months    Status New    Target Date 06/30/20      PT SHORT TERM GOAL #4   Title Patient able to stand with parallel bars & bilateral prostheses statically with minimal assist for 2 minutes.    Time 1    Period Months    Status New    Target Date 06/30/20             PT Long Term Goals - 05/30/20 1903      PT LONG TERM GOAL #1   Title Patient tolerates wear of Transtibial prosthesis and Transfemoral liner  >80% of awake hours and Transfemoral prosthesis >5hours total / day without skin or limb pain issues.  (All Target Date 11/25/2020)    Time 6    Period Months    Status New    Target Date 11/25/20      PT LONG TERM GOAL #2   Title Patient verbalizes and demonstrates understading of proper prosthetic care including donning both prostheses.  Time 6    Period Months    Status New    Target Date 11/25/20      PT LONG TERM GOAL #3   Title Patient's standing balance with RW & bilateral prostheses modified independent: sit to/from stand chairs with armrests, reaching 5" anteriorly & knee level and managing clothes.    Time 6    Period Months    Status New    Target Date 11/25/20      PT LONG TERM GOAL #4   Title Patient ambulates 4' around furniture with RW & bilateral prostheses modified independent.    Time 6    Period Months    Status New    Target Date 11/25/20                  Plan - 05/30/20 1935    Clinical Impression Statement This 64yo female was undergoing OPPT until January 2020 with left Transfemoral prosthesis with significant gains in her mobility and function. She underwent a right Transtibial Amputation on 01/19/2019 with prolonged healing issues. She received a right Transtibial prosthesis 01/25/2020 but has only used it during HHPT  for transfer training. She has not worn or used Transfemoral prosthesis until OPPT evaluation on 05/30/2020. She attempted to increase wear of Transtibial liner last week with daily wear a couple of days for 4-6 hours but developed deep red rash so stopped. She has not been wearing her shrinker so her Transtibial residual limb is significantly edematous. PT was able to donne without flexible inner liner and no socks.  Patient requires maximal assist to stand pulling on parallel bars. She requires PT to block her right knee in standing, left prosthetic knee locked and parallel bar support for static stance with assistance. She was able to release right bar to reach to PT shoulder standing directly in front for <3 seconds with assistance.  Prosthetic gait for 2' in parallel bars with maximal assist with PT manually blocking right knee, advancing Transfemoral prosthesis, weight shift and blocking / extending right knee.  Patient has significant weakness & deconditioning from prolonged limited mobility.  Patient would benefit from skilled PT to improve function & safety with bilateral prostheses.    Personal Factors and Comorbidities Comorbidity 3+;Education;Fitness;Past/Current Experience;Social Background;Time since onset of injury/illness/exacerbation    Comorbidities Rt TTA, Lt TFA, DM, HTN, PVD, depression, right Carpal Tunnel Release surgery    Examination-Activity Limitations Locomotion Level;Squat;Stand;Transfers;Stairs    Examination-Participation Restrictions Community Activity    Stability/Clinical Decision Making Evolving/Moderate complexity    Clinical Decision Making Moderate    Rehab Potential Good    PT Frequency 2x / week    PT Duration Other (comment)   6 months (26 weeks)   PT Treatment/Interventions ADLs/Self Care Home Management;DME Instruction;Gait training;Stair training;Functional mobility training;Therapeutic activities;Therapeutic exercise;Balance training;Neuromuscular  re-education;Patient/family education;Prosthetic Training;Manual techniques;Scar mobilization;Passive range of motion;Vasopneumatic Device;Vestibular    PT Next Visit Plan check skin, initiate limited wear, HEP & therapeutic exercise for strengthening, standing in //bars    Consulted and Agree with Plan of Care Patient;Family member/caregiver    Family Member Consulted nephew / caregiver Marikay Alar           Patient will benefit from skilled therapeutic intervention in order to improve the following deficits and impairments:  Abnormal gait, Cardiopulmonary status limiting activity, Decreased activity tolerance, Decreased balance, Decreased endurance, Decreased knowledge of use of DME, Decreased mobility, Decreased range of motion, Decreased skin integrity, Decreased scar mobility, Decreased strength, Dizziness, Increased edema, Impaired flexibility,  Postural dysfunction, Prosthetic Dependency, Obesity, Pain  Check all possible CPT codes:       97110 (Therapeutic Exercise)   40981 (SLP Treatment)   97112 (Neuro Re-ed)    92526 (Swallowing Treatment)    97116 (Gait Training)    [id number] (Cognitive Training, 1st 15 minutes)  97140 (Manual Therapy)    97130 (Cognitive Training, each add'l 15 minutes)   97530 (Therapeutic Activities)   Other, List CPT Code ____________     97535 (Self Care)        All codes above (97110 - 97535)   97012 (Mechanical Traction)   97014 (E-stim Unattended)   97032 (E-stim manual)   97033 (Ionto)   97035 (Ultrasound)   97016 (Vaso)   97760 (Orthotic Fit)  [id number] (Prosthetic Training)  [id number] (Physical Performance Training)  [id number] (Aquatic Therapy)  [id number] (Canalith Repositioning)  [id number] (Contrast Bath)  [id number] (Paraffin)  97597 (Wound Care 1st 20 sq cm)  97598 (Wound Care each add'l 20 sq cm)      Visit Diagnosis: Muscle weakness (generalized)  Unsteadiness on feet  Other abnormalities of  gait and mobility  Abnormal posture  Pain in right hand  Stiffness of left hip, not elsewhere classified      Problem List Patient Active Problem List   Diagnosis Date Noted  . Right below-knee amputee (HCC) 01/23/2019  . Gangrene of right foot (HCC) 01/17/2019  . History of transmetatarsal amputation of right foot (HCC) 12/13/2018  . At risk for adverse drug event 11/27/2018  . Osteomyelitis of third toe of right foot (HCC) 11/12/2018  . Pain in finger of right hand 02/20/2018  . Atherosclerosis of native arteries of the extremities with gangrene (HCC) 11/01/2017  . Unilateral AKA, left (HCC)   . Post-operative pain   . OSA (obstructive sleep apnea)   . Benign essential HTN   . DM (diabetes mellitus), secondary, uncontrolled, with peripheral vascular complications (HCC)   . Hyperkalemia   . Leukocytosis   . Acute blood loss anemia   . Amputation stump infection (HCC) 08/23/2017  . Amputation of left lower extremity above knee upon examination (HCC) 08/23/2017  . PAD (peripheral artery disease) (HCC) 07/23/2017  . Cellulitis 06/10/2017  . Hyponatremia 06/10/2017  . Fever 06/10/2017  . Rash 05/24/2017  . HTN (hypertension)   . Allergic rhinitis   . Insomnia   . Depression     Vladimir Faster PT, DPT 05/31/2020, 4:14 PM  [facility] Physical Therapy [address] [place], [place], [address] Phone: [phone]   Fax:  [phone]  Name: TU SHIMMEL MRN: [id number] Date of Birth: 03/13/1956

## 2020-06-01 ENCOUNTER — Ambulatory Visit (INDEPENDENT_AMBULATORY_CARE_PROVIDER_SITE_OTHER): Payer: Medicare HMO | Admitting: Physical Therapy

## 2020-06-01 ENCOUNTER — Other Ambulatory Visit: Payer: Self-pay

## 2020-06-01 ENCOUNTER — Encounter: Payer: Self-pay | Admitting: Physical Therapy

## 2020-06-01 DIAGNOSIS — M6281 Muscle weakness (generalized): Secondary | ICD-10-CM

## 2020-06-01 DIAGNOSIS — R293 Abnormal posture: Secondary | ICD-10-CM

## 2020-06-01 DIAGNOSIS — M25652 Stiffness of left hip, not elsewhere classified: Secondary | ICD-10-CM | POA: Diagnosis not present

## 2020-06-01 DIAGNOSIS — M79641 Pain in right hand: Secondary | ICD-10-CM | POA: Diagnosis not present

## 2020-06-01 DIAGNOSIS — R2681 Unsteadiness on feet: Secondary | ICD-10-CM

## 2020-06-01 DIAGNOSIS — R2689 Other abnormalities of gait and mobility: Secondary | ICD-10-CM | POA: Diagnosis not present

## 2020-06-01 NOTE — Therapy (Signed)
[facility] [address] [place], [place], [address] Phone: [phone]   Fax:  [phone]  Physical Therapy Treatment  Patient Details  Name: Danielle Buchanan MRN: [id number] Date of Birth: 1956/01/05 Referring Provider (PT): [name], MD   Encounter Date: 06/01/2020   PT End of Session - 06/01/20 1100    Visit Number 2    Number of Visits 52    Date for PT Re-Evaluation 08/26/20    Authorization Type [employer]    Authorization Time Period $10 co-pay,    PT Start Time 1100    PT Stop Time 1148    PT Time Calculation (min) 48 min    Equipment Utilized During Treatment Gait belt    Activity Tolerance Patient tolerated treatment well    Behavior During Therapy Flat affect;WFL for tasks assessed/performed           Past Medical History:  Diagnosis Date  . Allergic rhinitis   . Depression   . Diabetes (HCC)   . Dyslipidemia   . HTN (hypertension)   . Insomnia   . OSA (obstructive sleep apnea)    no cpap  . Urinary incontinence     Past Surgical History:  Procedure Laterality Date  . ABDOMINAL AORTOGRAM W/LOWER EXTREMITY N/A 06/24/2017   Procedure: ABDOMINAL AORTOGRAM W/LOWER EXTREMITY;  Surgeon: Maeola Harman, MD;  Location: [facility] INVASIVE CV LAB;  Service: Cardiovascular;  Laterality: N/A;  . ABDOMINAL AORTOGRAM W/LOWER EXTREMITY Right 10/09/2017   Procedure: ABDOMINAL AORTOGRAM W/LOWER EXTREMITY;  Surgeon: Maeola Harman, MD;  Location: [facility] INVASIVE CV LAB;  Service: Cardiovascular;  Laterality: Right;  . AMPUTATION Left 07/23/2017   Procedure: AMPUTATION  BELOW KNEE;  Surgeon: Maeola Harman, MD;  Location: [facility] OR;  Service: Vascular;  Laterality: Left;  . AMPUTATION Left 08/12/2017   Procedure: REVISION BELOW KNEE;  Surgeon: Maeola Harman, MD;  Location: [facility] OR;  Service: Vascular;  Laterality: Left;  . AMPUTATION Left 08/27/2017   Procedure: left ABOVE KNEE AMPUTATION;  Surgeon:  Maeola Harman, MD;  Location: [facility] OR;  Service: Vascular;  Laterality: Left;  . AMPUTATION Left 08/30/2017   Procedure: REVISION AMPUTATION ABOVE KNEE;  Surgeon: Nada Libman, MD;  Location: [facility] OR;  Service: Vascular;  Laterality: Left;  . AMPUTATION Right 11/04/2017   Procedure: AMPUTATION RIGHT FOURTH TOE;  Surgeon: Maeola Harman, MD;  Location: [facility] OR;  Service: Vascular;  Laterality: Right;  . AMPUTATION Right 11/12/2018   Procedure: AMPUTATION OF second, third and fifth toes;  Surgeon: Nada Libman, MD;  Location: [facility] OR;  Service: Vascular;  Laterality: Right;  . AMPUTATION Right 01/19/2019   Procedure: AMPUTATION BELOW KNEE;  Surgeon: Sherren Kerns, MD;  Location: [facility] OR;  Service: Vascular;  Laterality: Right;  . APPLICATION OF WOUND VAC Left 08/27/2017   Procedure: APPLICATION OF WOUND VAC;  Surgeon: Maeola Harman, MD;  Location: [facility] OR;  Service: Vascular;  Laterality: Left;  . CARPAL TUNNEL RELEASE Right   . COLONOSCOPY    . LOWER EXTREMITY ANGIOGRAM Right 11/12/2018   Procedure: ANGIOGRAM OF Right LEG;  Surgeon: Nada Libman, MD;  Location: [facility] OR;  Service: Vascular;  Laterality: Right;  . LOWER EXTREMITY INTERVENTION Right 10/09/2017   Procedure: LOWER EXTREMITY INTERVENTION;  Surgeon: Maeola Harman, MD;  Location: [facility] INVASIVE CV LAB;  Service: Cardiovascular;  Laterality: Right;  . TOE AMPUTATION Right 11/12/2018   2ND 3RD & 5TH TOE   . TRANSMETATARSAL AMPUTATION Right  11/19/2018   Procedure: TRANSMETATARSAL AMPUTATION REVISION;  Surgeon: Nada Libman, MD;  Location: [facility] OR;  Service: Vascular;  Laterality: Right;    There were no vitals filed for this visit.   Subjective Assessment - 06/01/20 1100    Subjective She has been wearing shrinker at all times. They applied Cortizone one time only yesterday. The rash looks less angry and seems to be improving. The Transfemoral liner ~9 hours without issues.    Patient Stated  Goals Walk with 2 prostheses in home mostly and maybe limited community.    Currently in Pain? No/denies    Pain Onset More than a month ago           Prosthetic Training Transtibial residual limb has rash still present but decreased redness. She has shrinker on limb but distal portion not in contact with residual limb. PT instructed with demo & verbal cues on proper donning of shrinker & adjusting during day to maintain contact distally. Pt & nephew verbalized understanding. PT demo & verbal cues on donning Transtibial prosthetic liner with alignment of suspension pin. Pt verbalizes understanding but requires assistance to start liner on limb.   PT instructed in donning & adjusting ply socks for Transtibial prosthesis. Pt & nephew verbalized understanding.  PT donned transtibial prosthesis without flexible inner liner as limb is still too edematous with 5-ply fit. Patient will probably not be able to utilize flexible inner liner until she needs 10-ply fit.  Pt & nephew verbalized understanding. PT demo & instructed in positioning transtibial prosthesis in sitting for support. Pt & nephew verbalized understanding.  PT recommended wearing transtibial prosthesis 1 hour 2x/day with minimum of 2 hours off between wears.  PT recommended wearing transfemoral liner most of awake hours. Nephew and patient report that she sleeps during day and stays up all night.  PT instructed that goal is to wear prosthesis most awake hours not to conform to awake hours.  Liner / prosthesis needs to be off 6-8 hours to enable her skin to not stay over moist which leads to breakdown.  Patient & nephew verbalized understanding. PT instructed patient in maneuvering w/c using right TTA prosthesis and LUE. She can limit use of RUE to not inflame carpal tunnel issues. Pt return demo and verbalized understanding.   PT demo & instructed patient in w/c push-up forward to shift weight over TTA prosthesis. PT recommended 5 reps 3 sets  to build strength as initial component of sit to stand.  PT demo how bar stool under left ischium with right TTA prosthetic foot on floor.  However time constraints limit ability to perform today.    Nephew reports they ordered tennis elbow splint with gel pad.  PT recommended trying with gel at room temperature 1-2 days with gel pad over extensor muscles distal to elbow.  Then refrigerate overnight and assess if helps her pain more than room temperature.  If cold helps but cools off too rapidly then try freezing it overnight. Nephew and patient verbalized understanding.                        PT Education - 06/01/20 1130    Education Details w/c push-up    Person(s) Educated Patient;Caregiver(s)    Methods Explanation;Demonstration;Verbal cues    Comprehension Verbalized understanding;Need further instruction            PT Short Term Goals - 06/01/20 1854      PT SHORT TERM GOAL #1  Title Patient tolerates wearing TTA prosthesis >4 hrs total / day without increase in skin issues. (All STGs Target Date: 06/30/2020)    Time 1    Period Months    Status On-going    Target Date 06/30/20      PT SHORT TERM GOAL #2   Title Patient donnes transtibial prosthesis with minimal assist.    Time 1    Period Months    Target Date 06/30/20      PT SHORT TERM GOAL #3   Title Sit to stand pulling on parallel bars with moderate assist.    Time 1    Period Months    Status On-going    Target Date 06/30/20      PT SHORT TERM GOAL #4   Title Patient able to stand with parallel bars & bilateral prostheses statically with minimal assist for 2 minutes.    Time 1    Period Months    Status On-going    Target Date 06/30/20             PT Long Term Goals - 06/01/20 1854      PT LONG TERM GOAL #1   Title Patient tolerates wear of Transtibial prosthesis and Transfemoral liner  >80% of awake hours and Transfemoral prosthesis >5hours total / day without skin or limb pain  issues.  (All Target Date 11/25/2020)    Time 6    Period Months    Status On-going    Target Date 11/26/19      PT LONG TERM GOAL #2   Title Patient verbalizes and demonstrates understading of proper prosthetic care including donning both prostheses.    Time 6    Period Months    Status On-going    Target Date 11/26/19      PT LONG TERM GOAL #3   Title Patient's standing balance with RW & bilateral prostheses modified independent: sit to/from stand chairs with armrests, reaching 5" anteriorly & knee level and managing clothes.    Time 6    Period Months    Status On-going    Target Date 11/26/19      PT LONG TERM GOAL #4   Title Patient ambulates 5950' around furniture with RW & bilateral prostheses modified independent.    Time 6    Period Months    Status On-going    Target Date 11/26/19                 Plan - 06/01/20 1100    Clinical Impression Statement PT session focused on wear & use of Transtibial prosthesis. With right leg having anatomical knee, she was greater potential to be able to introduce for limited exercises & activities at home with family.  Patient's right limb rash appears less red and less edematous.    Personal Factors and Comorbidities Comorbidity 3+;Education;Fitness;Past/Current Experience;Social Background;Time since onset of injury/illness/exacerbation    Comorbidities Rt TTA, Lt TFA, DM, HTN, PVD, depression, right Carpal Tunnel Release surgery    Examination-Activity Limitations Locomotion Level;Squat;Stand;Transfers;Stairs    Examination-Participation Restrictions Community Activity    Stability/Clinical Decision Making Evolving/Moderate complexity    Rehab Potential Good    PT Frequency 2x / week    PT Duration Other (comment)   6 months (26 weeks)   PT Treatment/Interventions ADLs/Self Care Home Management;DME Instruction;Gait training;Stair training;Functional mobility training;Therapeutic activities;Therapeutic exercise;Balance  training;Neuromuscular re-education;Patient/family education;Prosthetic Training;Manual techniques;Scar mobilization;Passive range of motion;Vasopneumatic Device;Vestibular    PT Next Visit Plan check skin, initiate limited  wear, HEP & therapeutic exercise for strengthening, HEP to start with sitting on bar stool with left ischuim on stool & RLE on floor near sink.    Consulted and Agree with Plan of Care Patient;Family member/caregiver    Family Member Consulted nephew / caregiver Marikay Alar           Patient will benefit from skilled therapeutic intervention in order to improve the following deficits and impairments:  Abnormal gait, Cardiopulmonary status limiting activity, Decreased activity tolerance, Decreased balance, Decreased endurance, Decreased knowledge of use of DME, Decreased mobility, Decreased range of motion, Decreased skin integrity, Decreased scar mobility, Decreased strength, Dizziness, Increased edema, Impaired flexibility, Postural dysfunction, Prosthetic Dependency, Obesity, Pain  Visit Diagnosis: Muscle weakness (generalized)  Unsteadiness on feet  Other abnormalities of gait and mobility  Abnormal posture  Pain in right hand  Stiffness of left hip, not elsewhere classified     Problem List Patient Active Problem List   Diagnosis Date Noted  . Right below-knee amputee (HCC) 01/23/2019  . Gangrene of right foot (HCC) 01/17/2019  . History of transmetatarsal amputation of right foot (HCC) 12/13/2018  . At risk for adverse drug event 11/27/2018  . Osteomyelitis of third toe of right foot (HCC) 11/12/2018  . Pain in finger of right hand 02/20/2018  . Atherosclerosis of native arteries of the extremities with gangrene (HCC) 11/01/2017  . Unilateral AKA, left (HCC)   . Post-operative pain   . OSA (obstructive sleep apnea)   . Benign essential HTN   . DM (diabetes mellitus), secondary, uncontrolled, with peripheral vascular complications (HCC)   .  Hyperkalemia   . Leukocytosis   . Acute blood loss anemia   . Amputation stump infection (HCC) 08/23/2017  . Amputation of left lower extremity above knee upon examination (HCC) 08/23/2017  . PAD (peripheral artery disease) (HCC) 07/23/2017  . Cellulitis 06/10/2017  . Hyponatremia 06/10/2017  . Fever 06/10/2017  . Rash 05/24/2017  . HTN (hypertension)   . Allergic rhinitis   . Insomnia   . Depression     Vladimir Faster, PT, DPT 06/01/2020, 6:56 PM  [facility] [address] [place], [place], [address] Phone: [phone]   Fax:  [phone]  Name: Danielle Buchanan MRN: [id number] Date of Birth: 08/28/1956

## 2020-06-06 ENCOUNTER — Encounter: Payer: Self-pay | Admitting: Neurology

## 2020-06-06 ENCOUNTER — Other Ambulatory Visit (INDEPENDENT_AMBULATORY_CARE_PROVIDER_SITE_OTHER): Payer: Medicare HMO

## 2020-06-06 ENCOUNTER — Other Ambulatory Visit: Payer: Self-pay

## 2020-06-06 ENCOUNTER — Ambulatory Visit: Payer: Medicare HMO | Admitting: Neurology

## 2020-06-06 VITALS — BP 122/67 | HR 67 | Resp 18 | Ht 62.0 in

## 2020-06-06 DIAGNOSIS — R413 Other amnesia: Secondary | ICD-10-CM

## 2020-06-06 LAB — TSH: TSH: 2.4 u[IU]/mL (ref 0.35–4.50)

## 2020-06-06 LAB — VITAMIN B12: Vitamin B-12: 197 pg/mL — ABNORMAL LOW (ref 211–911)

## 2020-06-06 NOTE — Patient Instructions (Addendum)
Your provider has requested that you have labwork completed today. Please go to [facility] ([phone]) on the second floor of this building before leaving the office today. You do not need to check in. If you are not called within 15 minutes please check with the front desk.  Your provider has requested that you have an appointment with Neuropsychology testing.
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
## 2020-06-08 ENCOUNTER — Encounter: Payer: Self-pay | Admitting: Physical Therapy

## 2020-06-08 ENCOUNTER — Other Ambulatory Visit: Payer: Self-pay

## 2020-06-08 ENCOUNTER — Ambulatory Visit (INDEPENDENT_AMBULATORY_CARE_PROVIDER_SITE_OTHER): Payer: Medicare HMO | Admitting: Physical Therapy

## 2020-06-08 DIAGNOSIS — M25652 Stiffness of left hip, not elsewhere classified: Secondary | ICD-10-CM | POA: Diagnosis not present

## 2020-06-08 DIAGNOSIS — R293 Abnormal posture: Secondary | ICD-10-CM

## 2020-06-08 DIAGNOSIS — M79641 Pain in right hand: Secondary | ICD-10-CM

## 2020-06-08 DIAGNOSIS — R2689 Other abnormalities of gait and mobility: Secondary | ICD-10-CM | POA: Diagnosis not present

## 2020-06-08 DIAGNOSIS — M6281 Muscle weakness (generalized): Secondary | ICD-10-CM | POA: Diagnosis not present

## 2020-06-08 DIAGNOSIS — R2681 Unsteadiness on feet: Secondary | ICD-10-CM | POA: Diagnosis not present

## 2020-06-08 NOTE — Therapy (Signed)
[facility] Physical Therapy [address] [place], [place], [address] Phone: [phone]   Fax:  [phone]  Physical Therapy Treatment  Patient Details  Name: Danielle Buchanan MRN: [id number] Date of Birth: Jul 17, 1956 Referring Provider (PT): [name], MD   Encounter Date: 06/08/2020   PT End of Session - 06/08/20 1037    Visit Number 3    Number of Visits 52    Date for PT Re-Evaluation 08/26/20    Authorization Type [employer]    Authorization Time Period $10 co-pay,    PT Start Time 0933    PT Stop Time 1016    PT Time Calculation (min) 43 min    Equipment Utilized During Treatment Gait belt    Activity Tolerance Patient tolerated treatment well    Behavior During Therapy Flat affect;WFL for tasks assessed/performed           Past Medical History:  Diagnosis Date  . Allergic rhinitis   . Depression   . Diabetes (HCC)   . Dyslipidemia   . HTN (hypertension)   . Insomnia   . OSA (obstructive sleep apnea)    no cpap  . Urinary incontinence     Past Surgical History:  Procedure Laterality Date  . ABDOMINAL AORTOGRAM W/LOWER EXTREMITY N/A 06/24/2017   Procedure: ABDOMINAL AORTOGRAM W/LOWER EXTREMITY;  Surgeon: Maeola Harman, MD;  Location: [facility] INVASIVE CV LAB;  Service: Cardiovascular;  Laterality: N/A;  . ABDOMINAL AORTOGRAM W/LOWER EXTREMITY Right 10/09/2017   Procedure: ABDOMINAL AORTOGRAM W/LOWER EXTREMITY;  Surgeon: Maeola Harman, MD;  Location: [facility] INVASIVE CV LAB;  Service: Cardiovascular;  Laterality: Right;  . AMPUTATION Left 07/23/2017   Procedure: AMPUTATION  BELOW KNEE;  Surgeon: Maeola Harman, MD;  Location: [facility] OR;  Service: Vascular;  Laterality: Left;  . AMPUTATION Left 08/12/2017   Procedure: REVISION BELOW KNEE;  Surgeon: Maeola Harman, MD;  Location: [facility] OR;  Service: Vascular;  Laterality: Left;  . AMPUTATION Left 08/27/2017   Procedure: left ABOVE KNEE AMPUTATION;  Surgeon:  Maeola Harman, MD;  Location: [facility] OR;  Service: Vascular;  Laterality: Left;  . AMPUTATION Left 08/30/2017   Procedure: REVISION AMPUTATION ABOVE KNEE;  Surgeon: Nada Libman, MD;  Location: [facility] OR;  Service: Vascular;  Laterality: Left;  . AMPUTATION Right 11/04/2017   Procedure: AMPUTATION RIGHT FOURTH TOE;  Surgeon: Maeola Harman, MD;  Location: [facility] OR;  Service: Vascular;  Laterality: Right;  . AMPUTATION Right 11/12/2018   Procedure: AMPUTATION OF second, third and fifth toes;  Surgeon: Nada Libman, MD;  Location: [facility] OR;  Service: Vascular;  Laterality: Right;  . AMPUTATION Right 01/19/2019   Procedure: AMPUTATION BELOW KNEE;  Surgeon: Sherren Kerns, MD;  Location: [facility] OR;  Service: Vascular;  Laterality: Right;  . APPLICATION OF WOUND VAC Left 08/27/2017   Procedure: APPLICATION OF WOUND VAC;  Surgeon: Maeola Harman, MD;  Location: [facility] OR;  Service: Vascular;  Laterality: Left;  . CARPAL TUNNEL RELEASE Right   . COLONOSCOPY    . LOWER EXTREMITY ANGIOGRAM Right 11/12/2018   Procedure: ANGIOGRAM OF Right LEG;  Surgeon: Nada Libman, MD;  Location: [facility] OR;  Service: Vascular;  Laterality: Right;  . LOWER EXTREMITY INTERVENTION Right 10/09/2017   Procedure: LOWER EXTREMITY INTERVENTION;  Surgeon: Maeola Harman, MD;  Location: [facility] INVASIVE CV LAB;  Service: Cardiovascular;  Laterality: Right;  . TOE AMPUTATION Right 11/12/2018   2ND 3RD & 5TH TOE   . TRANSMETATARSAL AMPUTATION Right  11/19/2018   Procedure: TRANSMETATARSAL AMPUTATION REVISION;  Surgeon: Nada LibmanBrabham, Vance W, MD;  [facility];  Service: Vascular;  Laterality: Right;    There were no vitals filed for this visit.   Subjective Assessment - 06/08/20 0930    Subjective She has been wearing TTA prosthetic liner 1 hour 2x/day and yesterday she forgot & wore it 2hr 15 min with increased redness.    Patient Stated Goals Walk with 2 prostheses in home mostly and maybe limited  community.    Currently in Pain? Yes    Pain Score 7     Pain Location Wrist    Pain Orientation Right    Pain Descriptors / Indicators Aching;Sore    Pain Type Chronic pain    Pain Onset More than a month ago    Aggravating Factors  using right hand especially with pressure    Pain Relieving Factors ice or heat, wear brace               Prosthetics Assessment - 06/08/20 1251      Prosthetics   Donning prosthesis  Min assist    Doffing prosthesis  Supervision   using cane to reach release button. Pt return demo   Prosthesis Description TTA prosthesis silicon liner with shuttle pin lock suspension, SACH foot.   TFA prosthesis manual locked multiaxial knee, single axis foot, silicon liner with velcro lanyard suspension.      K code/activity level with prosthetic use  K2 limited community with fixed cadence  Prosthesis has manual lock knee posteriorly offset, single axis foot, silicon liner with velcro lateral suspension.                          [facility] Adult PT Treatment/Exercise - 06/08/20 1251      Transfers   Transfers Sit to Stand;Stand to Sit;Lateral/Scoot Transfers    Sit to Stand 3: Mod assist;With upper extremity assist;With armrests;From chair/3-in-1;Other (comment)   pulling on //bars, TFA prosthetic knee locking upon arising   Sit to Stand Details Tactile cues for weight shifting;Visual cues for safe use of DME/AE;Verbal cues for sequencing;Verbal cues for technique;Verbal cues for safe use of DME/AE    Stand to Sit 4: Min assist;With upper extremity assist;With armrests;To chair/3-in-1;Other (comment)   using //bars to lower into w/c, prosthetic knee locked   Stand to Sit Details (indicate cue type and reason) Tactile cues for weight shifting;Visual cues for safe use of DME/AE;Verbal cues for technique;Verbal cues for safe use of DME/AE    Lateral/Scoot Transfers --      Ambulation/Gait   Ambulation/Gait Yes    Ambulation/Gait Assistance 2: Max assist   1  person assist today   Ambulation/Gait Assistance Details tactile & verbal cues on right knee extension with PT bracing intermittently, weight shift over stance limb and sequence.     Ambulation Distance (Feet) 4 Feet   3' & 4'   Assistive device Parallel bars;Prostheses   right TTA & left TFA locked knee prostheses   Gait Pattern Step-to pattern;Decreased step length - right;Decreased stance time - left;Decreased stride length;Antalgic;Lateral hip instability;Trunk flexed;Wide base of support;Abducted - left;Poor foot clearance - left;Poor foot clearance - right    Ambulation Surface Level;Indoor      Posture/Postural Control   Posture/Postural Control --    Postural Limitations --      Neck Exercises: Seated   Neck Retraction 10 reps;3 secs    Neck Retraction Limitations demo &  verbal cues    Other Seated Exercise shoulder depression with scapular retraction  pushing elbows into w/c armrests  10 reps with tactile & verbal cues      Knee/Hip Exercises: Seated   Other Seated Knee/Hip Exercises modified leg press with TTA prosthesis positioned with foot against wall resting on chair: pressing into wall for 5 sec hold.  PT recommended building up tolerance to 5 min with 5 sec hold 5 sec relax 3x/day.  Pt & nephew verbalized understanding.       Prosthetics   Prosthetic Care Comments  wear prosthesis & liner 1.5 hours 2-3 x/day.  PT verbal instruction on cloth liner-liner that can be used under liner to wick moisture but also can cause slippage of liner creating an air pocket.      Current prosthetic wear tolerance (days/week)  liner daily    Current prosthetic wear tolerance (#hours/day)  liner for TTA 1 hour 2x/day except once 2.25 hours with increased redness.  liner TFA most of awake hours.     Current prosthetic weight-bearing tolerance (hours/day)  No pain or discomfort with standing in //bars.     Edema pitting edema TTA limb    Residual limb condition  right TTA has medium red rash over  distal limb but no signs of infection.  bulbous edematous shape.  no open areas.    TFA no issues noted.     Education Provided Skin check;Residual limb care;Proper Donning;Correct ply sock adjustment;Proper wear schedule/adjustment;Other (comment)   see prosthetic care comments   Person(s) Educated Patient;Caregiver(s)    Education Method Explanation;Verbal cues;Demonstration;Tactile cues    Education Method Verbalized understanding;Verbal cues required;Needs further instruction;Tactile cues required      Neck Exercises: Stretches   Other Neck Stretches RUE stabilization holding w/c wheel and cervical side bend and lean to stretch upper trapezius 30 second hold 3 reps.                     PT Short Term Goals - 06/01/20 1854      PT SHORT TERM GOAL #1   Title Patient tolerates wearing TTA prosthesis >4 hrs total / day without increase in skin issues. (All STGs Target Date: 06/30/2020)    Time 1    Period Months    Status On-going    Target Date 06/30/20      PT SHORT TERM GOAL #2   Title Patient donnes transtibial prosthesis with minimal assist.    Time 1    Period Months    Target Date 06/30/20      PT SHORT TERM GOAL #3   Title Sit to stand pulling on parallel bars with moderate assist.    Time 1    Period Months    Status On-going    Target Date 06/30/20      PT SHORT TERM GOAL #4   Title Patient able to stand with parallel bars & bilateral prostheses statically with minimal assist for 2 minutes.    Time 1    Period Months    Status On-going    Target Date 06/30/20             PT Long Term Goals - 06/01/20 1854      PT LONG TERM GOAL #1   Title Patient tolerates wear of Transtibial prosthesis and Transfemoral liner  >80% of awake hours and Transfemoral prosthesis >5hours total / day without skin or limb pain issues.  (All Target Date 11/25/2020)  Time 6    Period Months    Status On-going    Target Date 11/26/19      PT LONG TERM GOAL #2   Title  Patient verbalizes and demonstrates understading of proper prosthetic care including donning both prostheses.    Time 6    Period Months    Status On-going    Target Date 11/26/19      PT LONG TERM GOAL #3   Title Patient's standing balance with RW & bilateral prostheses modified independent: sit to/from stand chairs with armrests, reaching 5" anteriorly & knee level and managing clothes.    Time 6    Period Months    Status On-going    Target Date 11/26/19      PT LONG TERM GOAL #4   Title Patient ambulates 18' around furniture with RW & bilateral prostheses modified independent.    Time 6    Period Months    Status On-going    Target Date 11/26/19                 Plan - 06/08/20 1037    Clinical Impression Statement PT instructed pt & caregiver in exercises for right UE to improve position & ice massage and modified leg press. She has general understanding & nephew will review at home. PT also worked on prosthetic gait in //bars with bilateral prostheses with improved right knee extension strength but fatigued quickly.    Personal Factors and Comorbidities Comorbidity 3+;Education;Fitness;Past/Current Experience;Social Background;Time since onset of injury/illness/exacerbation    Comorbidities Rt TTA, Lt TFA, DM, HTN, PVD, depression, right Carpal Tunnel Release surgery    Examination-Activity Limitations Locomotion Level;Squat;Stand;Transfers;Stairs    Examination-Participation Restrictions Community Activity    Stability/Clinical Decision Making Evolving/Moderate complexity    Rehab Potential Good    PT Frequency 2x / week    PT Duration Other (comment)   6 months (26 weeks)   PT Treatment/Interventions ADLs/Self Care Home Management;DME Instruction;Gait training;Stair training;Functional mobility training;Therapeutic activities;Therapeutic exercise;Balance training;Neuromuscular re-education;Patient/family education;Prosthetic Training;Manual techniques;Scar  mobilization;Passive range of motion;Vasopneumatic Device;Vestibular    PT Next Visit Plan review exercises, check skin, standing & gait    Consulted and Agree with Plan of Care Patient;Family member/caregiver    Family Member Consulted nephew / caregiver Marikay Alar           Patient will benefit from skilled therapeutic intervention in order to improve the following deficits and impairments:  Abnormal gait, Cardiopulmonary status limiting activity, Decreased activity tolerance, Decreased balance, Decreased endurance, Decreased knowledge of use of DME, Decreased mobility, Decreased range of motion, Decreased skin integrity, Decreased scar mobility, Decreased strength, Dizziness, Increased edema, Impaired flexibility, Postural dysfunction, Prosthetic Dependency, Obesity, Pain  Visit Diagnosis: Muscle weakness (generalized)  Unsteadiness on feet  Other abnormalities of gait and mobility  Abnormal posture  Pain in right hand  Stiffness of left hip, not elsewhere classified     Problem List Patient Active Problem List   Diagnosis Date Noted  . Right below-knee amputee (HCC) 01/23/2019  . Gangrene of right foot (HCC) 01/17/2019  . History of transmetatarsal amputation of right foot (HCC) 12/13/2018  . At risk for adverse drug event 11/27/2018  . Osteomyelitis of third toe of right foot (HCC) 11/12/2018  . Pain in finger of right hand 02/20/2018  . Atherosclerosis of native arteries of the extremities with gangrene (HCC) 11/01/2017  . Unilateral AKA, left (HCC)   . Post-operative pain   . OSA (obstructive sleep apnea)   . Benign  essential HTN   . DM (diabetes mellitus), secondary, uncontrolled, with peripheral vascular complications (HCC)   . Hyperkalemia   . Leukocytosis   . Acute blood loss anemia   . Amputation stump infection (HCC) 08/23/2017  . Amputation of left lower extremity above knee upon examination (HCC) 08/23/2017  . PAD (peripheral artery disease) (HCC)  07/23/2017  . Cellulitis 06/10/2017  . Hyponatremia 06/10/2017  . Fever 06/10/2017  . Rash 05/24/2017  . HTN (hypertension)   . Allergic rhinitis   . Insomnia   . Depression     Vladimir Faster PT, DPT 06/08/2020, 4:19 PM  [facility] Physical Therapy [address] [place], [place], [address] Phone: [phone]   Fax:  [phone]  Name: Danielle Buchanan MRN: [id number] Date of Birth: Mar 18, 1956

## 2020-06-09 ENCOUNTER — Ambulatory Visit (INDEPENDENT_AMBULATORY_CARE_PROVIDER_SITE_OTHER): Payer: Medicare HMO | Admitting: Physical Therapy

## 2020-06-09 ENCOUNTER — Encounter: Payer: Self-pay | Admitting: Physical Therapy

## 2020-06-09 DIAGNOSIS — R2681 Unsteadiness on feet: Secondary | ICD-10-CM | POA: Diagnosis not present

## 2020-06-09 DIAGNOSIS — M25652 Stiffness of left hip, not elsewhere classified: Secondary | ICD-10-CM

## 2020-06-09 DIAGNOSIS — M79641 Pain in right hand: Secondary | ICD-10-CM

## 2020-06-09 DIAGNOSIS — R2689 Other abnormalities of gait and mobility: Secondary | ICD-10-CM

## 2020-06-09 DIAGNOSIS — M6281 Muscle weakness (generalized): Secondary | ICD-10-CM

## 2020-06-09 DIAGNOSIS — R293 Abnormal posture: Secondary | ICD-10-CM

## 2020-06-09 NOTE — Therapy (Signed)
[facility] [address] [place], [place], [address] Phone: [phone]   Fax:  [phone]  Physical Therapy Treatment  Patient Details  Name: Danielle Buchanan MRN: [id number] Date of Birth: [birth year] Referring Provider (PT): [name], MD   Encounter Date: 06/09/2020   PT End of Session - 06/09/20 0845    Visit Number 4    Number of Visits 52    Date for PT Re-Evaluation 08/26/20    Authorization Type [employer]    Authorization Time Period $10 co-pay,    Progress Note Due on Visit 10    PT Start Time 0846    PT Stop Time 0932    PT Time Calculation (min) 46 min    Equipment Utilized During Treatment Gait belt    Activity Tolerance Patient tolerated treatment well    Behavior During Therapy Flat affect;WFL for tasks assessed/performed           Past Medical History:  Diagnosis Date   Allergic rhinitis    Depression    Diabetes (HCC)    Dyslipidemia    HTN (hypertension)    Insomnia    OSA (obstructive sleep apnea)    no cpap   Urinary incontinence     Past Surgical History:  Procedure Laterality Date   ABDOMINAL AORTOGRAM W/LOWER EXTREMITY N/A 06/24/2017   Procedure: ABDOMINAL AORTOGRAM W/LOWER EXTREMITY;  Surgeon: Maeola Harman, MD;  Location: [facility] INVASIVE CV LAB;  Service: Cardiovascular;  Laterality: N/A;   ABDOMINAL AORTOGRAM W/LOWER EXTREMITY Right 10/09/2017   Procedure: ABDOMINAL AORTOGRAM W/LOWER EXTREMITY;  Surgeon: Maeola Harman, MD;  Location: [facility] INVASIVE CV LAB;  Service: Cardiovascular;  Laterality: Right;   AMPUTATION Left 07/23/2017   Procedure: AMPUTATION  BELOW KNEE;  Surgeon: Maeola Harman, MD;  Location: [facility] OR;  Service: Vascular;  Laterality: Left;   AMPUTATION Left 08/12/2017   Procedure: REVISION BELOW KNEE;  Surgeon: Maeola Harman, MD;  Location: [facility] OR;  Service: Vascular;  Laterality: Left;   AMPUTATION Left 08/27/2017   Procedure: left  ABOVE KNEE AMPUTATION;  Surgeon: Maeola Harman, MD;  Location: [facility] OR;  Service: Vascular;  Laterality: Left;   AMPUTATION Left 08/30/2017   Procedure: REVISION AMPUTATION ABOVE KNEE;  Surgeon: Nada Libman, MD;  Location: [facility] OR;  Service: Vascular;  Laterality: Left;   AMPUTATION Right 11/04/2017   Procedure: AMPUTATION RIGHT FOURTH TOE;  Surgeon: Maeola Harman, MD;  Location: [facility] OR;  Service: Vascular;  Laterality: Right;   AMPUTATION Right 11/12/2018   Procedure: AMPUTATION OF second, third and fifth toes;  Surgeon: Nada Libman, MD;  Location: [facility] OR;  Service: Vascular;  Laterality: Right;   AMPUTATION Right 01/19/2019   Procedure: AMPUTATION BELOW KNEE;  Surgeon: Sherren Kerns, MD;  Location: [facility] OR;  Service: Vascular;  Laterality: Right;   APPLICATION OF WOUND VAC Left 08/27/2017   Procedure: APPLICATION OF WOUND VAC;  Surgeon: Maeola Harman, MD;  Location: [facility] OR;  Service: Vascular;  Laterality: Left;   CARPAL TUNNEL RELEASE Right    COLONOSCOPY     LOWER EXTREMITY ANGIOGRAM Right 11/12/2018   Procedure: Rosalin Hawking OF Right LEG;  Surgeon: Nada Libman, MD;  Location: [facility] OR;  Service: Vascular;  Laterality: Right;   LOWER EXTREMITY INTERVENTION Right 10/09/2017   Procedure: LOWER EXTREMITY INTERVENTION;  Surgeon: Maeola Harman, MD;  Location: [facility] INVASIVE CV LAB;  Service: Cardiovascular;  Laterality: Right;   TOE AMPUTATION Right 11/12/2018   2ND 3RD &  5TH TOE    TRANSMETATARSAL AMPUTATION Right 11/19/2018   Procedure: TRANSMETATARSAL AMPUTATION REVISION;  Surgeon: Nada Libman, MD;  Location: [facility] OR;  Service: Vascular;  Laterality: Right;    There were no vitals filed for this visit.   Subjective Assessment - 06/09/20 0845    Subjective She wore TTA liner only for 1.5 hours 2x yesterday with no skin changes.  She did the three arm exercises but has not found set up for modified leg press into wall.    Patient Stated  Goals Walk with 2 prostheses in home mostly and maybe limited community.    Currently in Pain? Yes    Pain Score 5     Pain Location Wrist    Pain Orientation Right    Pain Descriptors / Indicators Aching;Sore;Tingling    Pain Type Chronic pain    Pain Onset More than a month ago    Pain Frequency Constant    Aggravating Factors  standing with weight on arm    Pain Relieving Factors ice, heat, rest, medication                             [facility] Adult PT Treatment/Exercise - 06/09/20 0845      Transfers   Transfers Sit to Stand;Stand to Sit;Lateral/Scoot Transfers    Sit to Stand 3: Mod assist;With upper extremity assist;With armrests;From chair/3-in-1;Other (comment)   pulling on //bars, TFA prosthetic knee locking upon arising   Sit to Stand Details Tactile cues for weight shifting;Visual cues for safe use of DME/AE;Verbal cues for sequencing;Verbal cues for technique;Verbal cues for safe use of DME/AE    Stand to Sit 4: Min assist;With upper extremity assist;With armrests;To chair/3-in-1;Other (comment)   using //bars to lower into w/c, prosthetic knee locked   Stand to Sit Details (indicate cue type and reason) Tactile cues for weight shifting;Visual cues for safe use of DME/AE;Verbal cues for technique;Verbal cues for safe use of DME/AE      Ambulation/Gait   Ambulation/Gait Yes    Ambulation/Gait Assistance 2: Max assist   1 person assist today   Ambulation/Gait Assistance Details tactile & verbal cues on right knee extension with PT bracing intermittently, weight shift over stance limb and sequence.    Ambulation Distance (Feet) 8 Feet    Assistive device Parallel bars;Prostheses   right TTA & left TFA locked knee prostheses   Gait Pattern Step-to pattern;Decreased step length - right;Decreased stance time - left;Decreased stride length;Antalgic;Lateral hip instability;Trunk flexed;Wide base of support;Abducted - left;Poor foot clearance - left;Poor foot clearance  - right    Ambulation Surface Level;Indoor      Engineer, water Assistance 5: Quarry manager cues for sequencing;Verbal cues for Conservation officer, nature Both upper extremities    Comments cues on not stopping motion of w/c when repositioning hands for next push.       Neck Exercises: Seated   Neck Retraction --    Neck Retraction Limitations --    Other Seated Exercise --      Knee/Hip Exercises: Seated   Other Seated Knee/Hip Exercises quad set with RLE on chair bottom with towel roll under limb.  5 sec hold 10 reps 2 sets      Knee/Hip Exercises: Supine   Other Supine Knee/Hip Exercises modified leg press with TTA prosthesis positioned with foot against wall:  pressing into wall for 5 sec hold.  PT recommended building up tolerance to 5 min with 5 sec hold 5 sec relax 3x/day.  Pt & nephew verbalized understanding.       Prosthetics   Prosthetic Care Comments  wear prosthesis & liner 1.5 hours 2-3 x/day.  PT verbal instruction on cloth liner-liner that can be used under liner to wick moisture but also can cause slippage of liner creating an air pocket.      Current prosthetic wear tolerance (days/week)  liner daily    Current prosthetic wear tolerance (#hours/day)  liner for TTA 1.5 hour 2x/day except once 2.25 hours with increased redness.  liner TFA most of awake hours.     Current prosthetic weight-bearing tolerance (hours/day)  No pain or discomfort with standing in //bars.     Edema pitting edema TTA limb    Residual limb condition  right TTA has medium red rash over distal limb but no signs of infection.  bulbous edematous shape.  no open areas.    TFA no issues noted.     Education Provided Skin check;Residual limb care;Proper Donning;Correct ply sock adjustment;Proper wear schedule/adjustment;Other (comment)   see prosthetic care comments   Person(s) Educated Patient;Caregiver(s)     Education Method Explanation;Verbal cues    Education Method Verbalized understanding;Verbal cues required;Needs further instruction      Neck Exercises: Stretches   Other Neck Stretches RUE stabilization holding w/c wheel and cervical side bend and lean to stretch upper trapezius 30 second hold 3 reps.                     PT Short Term Goals - 06/01/20 1854      PT SHORT TERM GOAL #1   Title Patient tolerates wearing TTA prosthesis >4 hrs total / day without increase in skin issues. (All STGs Target Date: 06/30/2020)    Time 1    Period Months    Status On-going    Target Date 06/30/20      PT SHORT TERM GOAL #2   Title Patient donnes transtibial prosthesis with minimal assist.    Time 1    Period Months    Target Date 06/30/20      PT SHORT TERM GOAL #3   Title Sit to stand pulling on parallel bars with moderate assist.    Time 1    Period Months    Status On-going    Target Date 06/30/20      PT SHORT TERM GOAL #4   Title Patient able to stand with parallel bars & bilateral prostheses statically with minimal assist for 2 minutes.    Time 1    Period Months    Status On-going    Target Date 06/30/20             PT Long Term Goals - 06/01/20 1854      PT LONG TERM GOAL #1   Title Patient tolerates wear of Transtibial prosthesis and Transfemoral liner  >80% of awake hours and Transfemoral prosthesis >5hours total / day without skin or limb pain issues.  (All Target Date 11/25/2020)    Time 6    Period Months    Status On-going    Target Date 11/26/19      PT LONG TERM GOAL #2   Title Patient verbalizes and demonstrates understading of proper prosthetic care including donning both prostheses.    Time 6    Period Months    Status  On-going    Target Date 11/26/19      PT LONG TERM GOAL #3   Title Patient's standing balance with RW & bilateral prostheses modified independent: sit to/from stand chairs with armrests, reaching 5" anteriorly & knee level and  managing clothes.    Time 6    Period Months    Status On-going    Target Date 11/26/19      PT LONG TERM GOAL #4   Title Patient ambulates 47' around furniture with RW & bilateral prostheses modified independent.    Time 6    Period Months    Status On-going    Target Date 11/26/19                 Plan - 06/09/20 0848    Clinical Impression Statement Patient able to ambulate in //bars further today with improved control stepping and weight shift over stance prosthesis.    Personal Factors and Comorbidities Comorbidity 3+;Education;Fitness;Past/Current Experience;Social Background;Time since onset of injury/illness/exacerbation    Comorbidities Rt TTA, Lt TFA, DM, HTN, PVD, depression, right Carpal Tunnel Release surgery    Examination-Activity Limitations Locomotion Level;Squat;Stand;Transfers;Stairs    Examination-Participation Restrictions Community Activity    Stability/Clinical Decision Making Evolving/Moderate complexity    Rehab Potential Good    PT Frequency 2x / week    PT Duration Other (comment)   6 months (26 weeks)   PT Treatment/Interventions ADLs/Self Care Home Management;DME Instruction;Gait training;Stair training;Functional mobility training;Therapeutic activities;Therapeutic exercise;Balance training;Neuromuscular re-education;Patient/family education;Prosthetic Training;Manual techniques;Scar mobilization;Passive range of motion;Vasopneumatic Device;Vestibular    PT Next Visit Plan review exercises, check skin, standing & gait    Consulted and Agree with Plan of Care Patient;Family member/caregiver    Family Member Consulted nephew / caregiver Marikay Alar           Patient will benefit from skilled therapeutic intervention in order to improve the following deficits and impairments:  Abnormal gait, Cardiopulmonary status limiting activity, Decreased activity tolerance, Decreased balance, Decreased endurance, Decreased knowledge of use of DME, Decreased  mobility, Decreased range of motion, Decreased skin integrity, Decreased scar mobility, Decreased strength, Dizziness, Increased edema, Impaired flexibility, Postural dysfunction, Prosthetic Dependency, Obesity, Pain  Visit Diagnosis: Muscle weakness (generalized)  Unsteadiness on feet  Other abnormalities of gait and mobility  Abnormal posture  Pain in right hand  Stiffness of left hip, not elsewhere classified     Problem List Patient Active Problem List   Diagnosis Date Noted   Right below-knee amputee (HCC) 01/23/2019   Gangrene of right foot (HCC) 01/17/2019   History of transmetatarsal amputation of right foot (HCC) 12/13/2018   At risk for adverse drug event 11/27/2018   Osteomyelitis of third toe of right foot (HCC) 11/12/2018   Pain in finger of right hand 02/20/2018   Atherosclerosis of native arteries of the extremities with gangrene (HCC) 11/01/2017   Unilateral AKA, left (HCC)    Post-operative pain    OSA (obstructive sleep apnea)    Benign essential HTN    DM (diabetes mellitus), secondary, uncontrolled, with peripheral vascular complications (HCC)    Hyperkalemia    Leukocytosis    Acute blood loss anemia    Amputation stump infection (HCC) 08/23/2017   Amputation of left lower extremity above knee upon examination (HCC) 08/23/2017   PAD (peripheral artery disease) (HCC) 07/23/2017   Cellulitis 06/10/2017   Hyponatremia 06/10/2017   Fever 06/10/2017   Rash 05/24/2017   HTN (hypertension)    Allergic rhinitis    Insomnia  Depression     Vladimir Faster PT, DPT 06/09/2020, 12:53 PM  [facility] [address] [place], [place], [address] Phone: [phone]   Fax:  [phone]  Name: Danielle Buchanan MRN: [id number] Date of Birth: Jan 04, 1956

## 2020-06-14 ENCOUNTER — Encounter: Payer: Self-pay | Admitting: Physical Therapy

## 2020-06-14 ENCOUNTER — Encounter: Payer: Medicare HMO | Admitting: Physical Therapy

## 2020-06-14 ENCOUNTER — Ambulatory Visit (INDEPENDENT_AMBULATORY_CARE_PROVIDER_SITE_OTHER): Payer: Medicare HMO | Admitting: Physical Therapy

## 2020-06-14 ENCOUNTER — Other Ambulatory Visit: Payer: Self-pay

## 2020-06-14 DIAGNOSIS — R2689 Other abnormalities of gait and mobility: Secondary | ICD-10-CM

## 2020-06-14 DIAGNOSIS — R2681 Unsteadiness on feet: Secondary | ICD-10-CM | POA: Diagnosis not present

## 2020-06-14 DIAGNOSIS — R293 Abnormal posture: Secondary | ICD-10-CM

## 2020-06-14 DIAGNOSIS — M25652 Stiffness of left hip, not elsewhere classified: Secondary | ICD-10-CM

## 2020-06-14 DIAGNOSIS — M79641 Pain in right hand: Secondary | ICD-10-CM | POA: Diagnosis not present

## 2020-06-14 DIAGNOSIS — M6281 Muscle weakness (generalized): Secondary | ICD-10-CM | POA: Diagnosis not present

## 2020-06-14 NOTE — Therapy (Signed)
[facility] Physical Therapy [address] [place], [place], [address] Phone: [phone]   Fax:  [phone]  Physical Therapy Treatment  Patient Details  Name: Danielle Buchanan MRN: [id number] Date of Birth: [birth year] Referring Provider (PT): [name], MD   Encounter Date: 06/14/2020   PT End of Session - 06/14/20 1258    Visit Number 5    Number of Visits 52    Date for PT Re-Evaluation 08/26/20    Authorization Type [employer]    Authorization Time Period $10 co-pay,    Progress Note Due on Visit 10    PT Start Time 1258    PT Stop Time 1345    PT Time Calculation (min) 47 min    Equipment Utilized During Treatment Gait belt    Activity Tolerance Patient tolerated treatment well    Behavior During Therapy Flat affect;WFL for tasks assessed/performed           Past Medical History:  Diagnosis Date  . Allergic rhinitis   . Depression   . Diabetes (HCC)   . Dyslipidemia   . HTN (hypertension)   . Insomnia   . OSA (obstructive sleep apnea)    no cpap  . Urinary incontinence     Past Surgical History:  Procedure Laterality Date  . ABDOMINAL AORTOGRAM W/LOWER EXTREMITY N/A 06/24/2017   Procedure: ABDOMINAL AORTOGRAM W/LOWER EXTREMITY;  Surgeon: Maeola Harman, MD;  Location: [facility] INVASIVE CV LAB;  Service: Cardiovascular;  Laterality: N/A;  . ABDOMINAL AORTOGRAM W/LOWER EXTREMITY Right 10/09/2017   Procedure: ABDOMINAL AORTOGRAM W/LOWER EXTREMITY;  Surgeon: Maeola Harman, MD;  Location: [facility] INVASIVE CV LAB;  Service: Cardiovascular;  Laterality: Right;  . AMPUTATION Left 07/23/2017   Procedure: AMPUTATION  BELOW KNEE;  Surgeon: Maeola Harman, MD;  Location: [facility] OR;  Service: Vascular;  Laterality: Left;  . AMPUTATION Left 08/12/2017   Procedure: REVISION BELOW KNEE;  Surgeon: Maeola Harman, MD;  Location: [facility] OR;  Service: Vascular;  Laterality: Left;  . AMPUTATION Left 08/27/2017   Procedure: left  ABOVE KNEE AMPUTATION;  Surgeon: Maeola Harman, MD;  Location: [facility] OR;  Service: Vascular;  Laterality: Left;  . AMPUTATION Left 08/30/2017   Procedure: REVISION AMPUTATION ABOVE KNEE;  Surgeon: Nada Libman, MD;  Location: [facility] OR;  Service: Vascular;  Laterality: Left;  . AMPUTATION Right 11/04/2017   Procedure: AMPUTATION RIGHT FOURTH TOE;  Surgeon: Maeola Harman, MD;  Location: [facility] OR;  Service: Vascular;  Laterality: Right;  . AMPUTATION Right 11/12/2018   Procedure: AMPUTATION OF second, third and fifth toes;  Surgeon: Nada Libman, MD;  Location: [facility] OR;  Service: Vascular;  Laterality: Right;  . AMPUTATION Right 01/19/2019   Procedure: AMPUTATION BELOW KNEE;  Surgeon: Sherren Kerns, MD;  Location: [facility] OR;  Service: Vascular;  Laterality: Right;  . APPLICATION OF WOUND VAC Left 08/27/2017   Procedure: APPLICATION OF WOUND VAC;  Surgeon: Maeola Harman, MD;  Location: [facility] OR;  Service: Vascular;  Laterality: Left;  . CARPAL TUNNEL RELEASE Right   . COLONOSCOPY    . LOWER EXTREMITY ANGIOGRAM Right 11/12/2018   Procedure: ANGIOGRAM OF Right LEG;  Surgeon: Nada Libman, MD;  Location: [facility] OR;  Service: Vascular;  Laterality: Right;  . LOWER EXTREMITY INTERVENTION Right 10/09/2017   Procedure: LOWER EXTREMITY INTERVENTION;  Surgeon: Maeola Harman, MD;  Location: [facility] INVASIVE CV LAB;  Service: Cardiovascular;  Laterality: Right;  . TOE AMPUTATION Right 11/12/2018   2ND 3RD &  5TH TOE   . TRANSMETATARSAL AMPUTATION Right 11/19/2018   Procedure: TRANSMETATARSAL AMPUTATION REVISION;  Surgeon: Nada LibmanBrabham, Vance W, MD;  Location: [facility] OR;  Service: Vascular;  Laterality: Right;    There were no vitals filed for this visit.   Subjective Assessment - 06/14/20 1258    Subjective She is wearing TTA liner & prosthesis 1.5 hrs 3x/day. The bottom of limb is real red for ~30 minutes after wear.    Patient Stated Goals Walk with 2 prostheses in home mostly and  maybe limited community.    Currently in Pain? Yes    Pain Score 4     Pain Location Wrist    Pain Orientation Right    Pain Descriptors / Indicators Aching;Sore    Pain Type Chronic pain    Pain Onset More than a month ago    Pain Frequency Constant    Aggravating Factors  use of hand especially pressing down    Pain Relieving Factors heat / ice & medication, compression glove, stretches.                             [facility] Adult PT Treatment/Exercise - 06/14/20 1300      Transfers   Transfers Sit to Stand;Stand to Sit;Lateral/Scoot Transfers    Sit to Stand 3: Mod assist;With upper extremity assist;With armrests;From chair/3-in-1;Other (comment)   pulling on //bars, TFA prosthetic knee locking upon arising   Sit to Stand Details Tactile cues for weight shifting;Visual cues for safe use of DME/AE;Verbal cues for sequencing;Verbal cues for technique;Verbal cues for safe use of DME/AE    Stand to Sit 4: Min assist;With upper extremity assist;With armrests;To chair/3-in-1;Other (comment)   using //bars to lower into w/c, prosthetic knee locked   Stand to Sit Details (indicate cue type and reason) Tactile cues for weight shifting;Visual cues for safe use of DME/AE;Verbal cues for technique;Verbal cues for safe use of DME/AE      Ambulation/Gait   Ambulation/Gait Yes    Ambulation/Gait Assistance 2: Max assist   1 person assist today   Ambulation/Gait Assistance Details RLE too weak to support her today.     Ambulation Distance (Feet) 1 Feet    Assistive device Parallel bars;Prostheses   right TTA & left TFA locked knee prostheses   Gait Pattern Step-to pattern;Decreased step length - right;Decreased stance time - left;Decreased stride length;Antalgic;Lateral hip instability;Trunk flexed;Wide base of support;Abducted - left;Poor foot clearance - left;Poor foot clearance - right    Ambulation Surface Level;Indoor      BankerWheelchair Mobility   Wheelchair Mobility --     Wheelchair Assistance --    Wheelchair Assistance Details --    Wheelchair Propulsion --      Therapeutic Activites    Therapeutic Activities Other Therapeutic Activities    Other Therapeutic Activities sitting in w/c without back or UE support with Right TTA supported on floor.  PT recommended to work on building tolerance with counter or table in front of her.        Neuro Re-ed    Neuro Re-ed Details  Standing in //bars with modA to maintain upright: RLE terminal knee extension 5 reps 2 sets with PT assisting with PT's knee and glut sets 5 reps 2 sets.   In // bars with left ischuim on 24" bar stool: RLE extension 5 reps 2 sets and single UE support & reaching overhead with 5 sec hold 3 reps per UE  Exercises   Exercises Other Exercises    Other Exercises  Modified w/c push up with RLE braced against counter and BUEs pushing forward for weight shift over foot.  PT manually & verbally cueing to shift weight forward.       Knee/Hip Exercises: Seated   Other Seated Knee/Hip Exercises --      Knee/Hip Exercises: Supine   Other Supine Knee/Hip Exercises modified leg press with TTA prosthesis positioned with foot against wall: pressing into wall for 5 sec hold.  PT recommended building up tolerance to 5 min with 5 sec hold 5 sec relax 3x/day.  Pt & nephew verbalized understanding.       Prosthetics   Prosthetic Care Comments  contine wear 1.5 hrs 3x/day try wearing double shrinkers up to 1 hour or to tolerance prior to wearing TTA prosthesis.      Current prosthetic wear tolerance (days/week)  liner daily    Current prosthetic wear tolerance (#hours/day)  TTA liner & prosthesis 1.5 hour 3x/day.  liner TFA most of awake hours.     Current prosthetic weight-bearing tolerance (hours/day)  No pain or discomfort with standing in //bars.     Edema pitting edema TTA limb    Residual limb condition  right TTA has medium red rash over distal limb but no signs of infection.  bulbous edematous  shape.  no open areas.    TFA no issues noted.     Education Provided Skin check;Residual limb care;Proper Donning;Correct ply sock adjustment;Proper wear schedule/adjustment;Other (comment)   see prosthetic care comments   Person(s) Educated Patient;Caregiver(s)    Education Method Explanation;Demonstration;Tactile cues;Verbal cues    Education Method Verbalized understanding;Verbal cues required;Needs further instruction      Neck Exercises: Stretches   Other Neck Stretches RUE stabilization holding w/c wheel and cervical side bend and lean to stretch upper trapezius 30 second hold 3 reps.                     PT Short Term Goals - 06/01/20 1854      PT SHORT TERM GOAL #1   Title Patient tolerates wearing TTA prosthesis >4 hrs total / day without increase in skin issues. (All STGs Target Date: 06/30/2020)    Time 1    Period Months    Status On-going    Target Date 06/30/20      PT SHORT TERM GOAL #2   Title Patient donnes transtibial prosthesis with minimal assist.    Time 1    Period Months    Target Date 06/30/20      PT SHORT TERM GOAL #3   Title Sit to stand pulling on parallel bars with moderate assist.    Time 1    Period Months    Status On-going    Target Date 06/30/20      PT SHORT TERM GOAL #4   Title Patient able to stand with parallel bars & bilateral prostheses statically with minimal assist for 2 minutes.    Time 1    Period Months    Status On-going    Target Date 06/30/20             PT Long Term Goals - 06/01/20 1854      PT LONG TERM GOAL #1   Title Patient tolerates wear of Transtibial prosthesis and Transfemoral liner  >80% of awake hours and Transfemoral prosthesis >5hours total / day without skin or limb pain issues.  (All Target Date  11/25/2020)    Time 6    Period Months    Status On-going    Target Date 11/26/19      PT LONG TERM GOAL #2   Title Patient verbalizes and demonstrates understading of proper prosthetic care  including donning both prostheses.    Time 6    Period Months    Status On-going    Target Date 11/26/19      PT LONG TERM GOAL #3   Title Patient's standing balance with RW & bilateral prostheses modified independent: sit to/from stand chairs with armrests, reaching 5" anteriorly & knee level and managing clothes.    Time 6    Period Months    Status On-going    Target Date 11/26/19      PT LONG TERM GOAL #4   Title Patient ambulates 54' around furniture with RW & bilateral prostheses modified independent.    Time 6    Period Months    Status On-going    Target Date 11/26/19                 Plan - 06/14/20 1258    Clinical Impression Statement Patient had increased difficulty with right knee extension in stance today compared to last week. PT session focused on functional strengthening activities.    Personal Factors and Comorbidities Comorbidity 3+;Education;Fitness;Past/Current Experience;Social Background;Time since onset of injury/illness/exacerbation    Comorbidities Rt TTA, Lt TFA, DM, HTN, PVD, depression, right Carpal Tunnel Release surgery    Examination-Activity Limitations Locomotion Level;Squat;Stand;Transfers;Stairs    Examination-Participation Restrictions Community Activity    Stability/Clinical Decision Making Evolving/Moderate complexity    Rehab Potential Good    PT Frequency 2x / week    PT Duration Other (comment)   6 months (26 weeks)   PT Treatment/Interventions ADLs/Self Care Home Management;DME Instruction;Gait training;Stair training;Functional mobility training;Therapeutic activities;Therapeutic exercise;Balance training;Neuromuscular re-education;Patient/family education;Prosthetic Training;Manual techniques;Scar mobilization;Passive range of motion;Vasopneumatic Device;Vestibular    PT Next Visit Plan review exercises, check skin, standing & gait, prosthetist to attend 9/8 PT session    Consulted and Agree with Plan of Care Patient;Family  member/caregiver    Family Member Consulted nephew / caregiver Marikay Alar           Patient will benefit from skilled therapeutic intervention in order to improve the following deficits and impairments:  Abnormal gait, Cardiopulmonary status limiting activity, Decreased activity tolerance, Decreased balance, Decreased endurance, Decreased knowledge of use of DME, Decreased mobility, Decreased range of motion, Decreased skin integrity, Decreased scar mobility, Decreased strength, Dizziness, Increased edema, Impaired flexibility, Postural dysfunction, Prosthetic Dependency, Obesity, Pain  Visit Diagnosis: Muscle weakness (generalized)  Unsteadiness on feet  Other abnormalities of gait and mobility  Abnormal posture  Pain in right hand  Stiffness of left hip, not elsewhere classified     Problem List Patient Active Problem List   Diagnosis Date Noted  . Right below-knee amputee (HCC) 01/23/2019  . Gangrene of right foot (HCC) 01/17/2019  . History of transmetatarsal amputation of right foot (HCC) 12/13/2018  . At risk for adverse drug event 11/27/2018  . Osteomyelitis of third toe of right foot (HCC) 11/12/2018  . Pain in finger of right hand 02/20/2018  . Atherosclerosis of native arteries of the extremities with gangrene (HCC) 11/01/2017  . Unilateral AKA, left (HCC)   . Post-operative pain   . OSA (obstructive sleep apnea)   . Benign essential HTN   . DM (diabetes mellitus), secondary, uncontrolled, with peripheral vascular complications (HCC)   .  Hyperkalemia   . Leukocytosis   . Acute blood loss anemia   . Amputation stump infection (HCC) 08/23/2017  . Amputation of left lower extremity above knee upon examination (HCC) 08/23/2017  . PAD (peripheral artery disease) (HCC) 07/23/2017  . Cellulitis 06/10/2017  . Hyponatremia 06/10/2017  . Fever 06/10/2017  . Rash 05/24/2017  . HTN (hypertension)   . Allergic rhinitis   . Insomnia   . Depression     Vladimir Faster, PT, DPT 06/14/2020, 4:34 PM  [facility] Physical Therapy [address] [place], [place], [address] Phone: [phone]   Fax:  [phone]  Name: Danielle Buchanan MRN: [id number] Date of Birth: 1956/05/14

## 2020-06-15 ENCOUNTER — Ambulatory Visit (INDEPENDENT_AMBULATORY_CARE_PROVIDER_SITE_OTHER): Payer: Medicare HMO | Admitting: Physical Therapy

## 2020-06-15 ENCOUNTER — Encounter: Payer: Self-pay | Admitting: Physical Therapy

## 2020-06-15 DIAGNOSIS — R293 Abnormal posture: Secondary | ICD-10-CM | POA: Diagnosis not present

## 2020-06-15 DIAGNOSIS — M6281 Muscle weakness (generalized): Secondary | ICD-10-CM | POA: Diagnosis not present

## 2020-06-15 DIAGNOSIS — M25652 Stiffness of left hip, not elsewhere classified: Secondary | ICD-10-CM | POA: Diagnosis not present

## 2020-06-15 DIAGNOSIS — M79641 Pain in right hand: Secondary | ICD-10-CM | POA: Diagnosis not present

## 2020-06-15 DIAGNOSIS — R2689 Other abnormalities of gait and mobility: Secondary | ICD-10-CM | POA: Diagnosis not present

## 2020-06-15 DIAGNOSIS — R2681 Unsteadiness on feet: Secondary | ICD-10-CM | POA: Diagnosis not present

## 2020-06-15 NOTE — Therapy (Signed)
[facility] [address] [place], [place], [address] Phone: [phone]   Fax:  [phone]  Physical Therapy Treatment  Patient Details  Name: Danielle Buchanan MRN: [id number] Date of Birth: 04-08-1956 Referring Provider (PT): [name], MD   Encounter Date: 06/15/2020   PT End of Session - 06/15/20 1354    Visit Number 6    Number of Visits 52    Date for PT Re-Evaluation 08/26/20    Authorization Type [employer]    Authorization Time Period $10 co-pay,    Progress Note Due on Visit 10    PT Start Time 1300    PT Stop Time 1347    PT Time Calculation (min) 47 min    Equipment Utilized During Treatment Gait belt    Activity Tolerance Patient tolerated treatment well    Behavior During Therapy Flat affect;WFL for tasks assessed/performed           Past Medical History:  Diagnosis Date  . Allergic rhinitis   . Depression   . Diabetes (HCC)   . Dyslipidemia   . HTN (hypertension)   . Insomnia   . OSA (obstructive sleep apnea)    no cpap  . Urinary incontinence     Past Surgical History:  Procedure Laterality Date  . ABDOMINAL AORTOGRAM W/LOWER EXTREMITY N/A 06/24/2017   Procedure: ABDOMINAL AORTOGRAM W/LOWER EXTREMITY;  Surgeon: Maeola Harman, MD;  Location: [facility] INVASIVE CV LAB;  Service: Cardiovascular;  Laterality: N/A;  . ABDOMINAL AORTOGRAM W/LOWER EXTREMITY Right 10/09/2017   Procedure: ABDOMINAL AORTOGRAM W/LOWER EXTREMITY;  Surgeon: Maeola Harman, MD;  Location: [facility] INVASIVE CV LAB;  Service: Cardiovascular;  Laterality: Right;  . AMPUTATION Left 07/23/2017   Procedure: AMPUTATION  BELOW KNEE;  Surgeon: Maeola Harman, MD;  Location: [facility] OR;  Service: Vascular;  Laterality: Left;  . AMPUTATION Left 08/12/2017   Procedure: REVISION BELOW KNEE;  Surgeon: Maeola Harman, MD;  Location: [facility] OR;  Service: Vascular;  Laterality: Left;  . AMPUTATION Left 08/27/2017   Procedure: left  ABOVE KNEE AMPUTATION;  Surgeon: Maeola Harman, MD;  Location: [facility] OR;  Service: Vascular;  Laterality: Left;  . AMPUTATION Left 08/30/2017   Procedure: REVISION AMPUTATION ABOVE KNEE;  Surgeon: Nada Libman, MD;  Location: [facility] OR;  Service: Vascular;  Laterality: Left;  . AMPUTATION Right 11/04/2017   Procedure: AMPUTATION RIGHT FOURTH TOE;  Surgeon: Maeola Harman, MD;  Location: [facility] OR;  Service: Vascular;  Laterality: Right;  . AMPUTATION Right 11/12/2018   Procedure: AMPUTATION OF second, third and fifth toes;  Surgeon: Nada Libman, MD;  Location: [facility] OR;  Service: Vascular;  Laterality: Right;  . AMPUTATION Right 01/19/2019   Procedure: AMPUTATION BELOW KNEE;  Surgeon: Sherren Kerns, MD;  Location: [facility] OR;  Service: Vascular;  Laterality: Right;  . APPLICATION OF WOUND VAC Left 08/27/2017   Procedure: APPLICATION OF WOUND VAC;  Surgeon: Maeola Harman, MD;  Location: [facility] OR;  Service: Vascular;  Laterality: Left;  . CARPAL TUNNEL RELEASE Right   . COLONOSCOPY    . LOWER EXTREMITY ANGIOGRAM Right 11/12/2018   Procedure: ANGIOGRAM OF Right LEG;  Surgeon: Nada Libman, MD;  Location: [facility] OR;  Service: Vascular;  Laterality: Right;  . LOWER EXTREMITY INTERVENTION Right 10/09/2017   Procedure: LOWER EXTREMITY INTERVENTION;  Surgeon: Maeola Harman, MD;  Location: [facility] INVASIVE CV LAB;  Service: Cardiovascular;  Laterality: Right;  . TOE AMPUTATION Right 11/12/2018   2ND 3RD &  5TH TOE   . TRANSMETATARSAL AMPUTATION Right 11/19/2018   Procedure: TRANSMETATARSAL AMPUTATION REVISION;  Surgeon: Nada LibmanBrabham, Vance W, MD;  [facility];  Service: Vascular;  Laterality: Right;    There were no vitals filed for this visit.   Subjective Assessment - 06/15/20 1300    Subjective Patient reports wearing prosthesis 1.5 hours 2x/day.  She has been doing her exercise of sitting without back or arm support to tolerance.    Patient Stated Goals Walk with 2  prostheses in home mostly and maybe limited community.    Currently in Pain? Yes    Pain Score 4     Pain Location Wrist    Pain Orientation Right    Pain Descriptors / Indicators Aching;Sore    Pain Type Chronic pain    Pain Onset More than a month ago    Pain Frequency Constant    Aggravating Factors  using RUE especially to push    Pain Relieving Factors ice, heat & medications                             [facility] Adult PT Treatment/Exercise - 06/15/20 1300      Transfers   Transfers Sit to Stand;Stand to Sit;Lateral/Scoot Transfers    Sit to Stand 3: Mod assist;With upper extremity assist;With armrests;From chair/3-in-1;Other (comment)   pulling on //bars, TFA prosthetic knee locking upon arising   Sit to Stand Details Tactile cues for weight shifting;Visual cues for safe use of DME/AE;Verbal cues for sequencing;Verbal cues for technique;Verbal cues for safe use of DME/AE    Stand to Sit 4: Min assist;With upper extremity assist;With armrests;To chair/3-in-1;Other (comment)   using //bars to lower into w/c, prosthetic knee locked   Stand to Sit Details (indicate cue type and reason) Tactile cues for weight shifting;Visual cues for safe use of DME/AE;Verbal cues for technique;Verbal cues for safe use of DME/AE      Ambulation/Gait   Ambulation/Gait Yes    Ambulation/Gait Assistance 2: Max assist   1 person assist today   Ambulation/Gait Assistance Details PT blocking right knee in stance, demo /verbal cues on step width, wt shift & upright posture.    Ambulation Distance (Feet) 8 Feet   3', 5' & 8'   Assistive device Parallel bars;Prostheses   right TTA & left TFA locked knee prostheses   Gait Pattern Step-to pattern;Decreased step length - right;Decreased stance time - left;Decreased stride length;Antalgic;Lateral hip instability;Trunk flexed;Wide base of support;Abducted - left;Poor foot clearance - left;Poor foot clearance - right    Ambulation Surface Level;Indoor       Psychologist, clinicalWheelchair Mobility   Wheelchair Mobility --    Wheelchair Assistance --    Camera operatorWheelchair Assistance Details --    Wheelchair Propulsion --      Knee/Hip Exercises: Seated   Other Seated Knee/Hip Exercises --      Knee/Hip Exercises: Supine   Other Supine Knee/Hip Exercises --      Prosthetics   Prosthetic Care Comments  wear double shrinkers for 30 minutes prior to wearing TTA prosthesis.  Increase TTA wear to 2 hrs 3x/day.      Current prosthetic wear tolerance (days/week)  daily (TTA prosthesis & TFA liner only)    Current prosthetic wear tolerance (#hours/day)  TTA 1.5 hour 2-3x/day.  liner TFA most of awake hours.     Current prosthetic weight-bearing tolerance (hours/day)  No pain or discomfort with standing in //bars.  Edema pitting edema TTA limb    Residual limb condition  right TTA has medium red rash over distal limb but no signs of infection.  bulbous edematous shape.  no open areas.    TFA no issues noted.     Education Provided Skin check;Residual limb care;Proper Donning;Correct ply sock adjustment;Proper wear schedule/adjustment;Other (comment)   see prosthetic care comments   Person(s) Educated Patient;Child(ren)    Education Method Demonstration;Explanation;Verbal cues    Education Method Verbalized understanding;Returned demonstration;Verbal cues required;Needs further instruction      Neck Exercises: Stretches   Other Neck Stretches --                    PT Short Term Goals - 06/01/20 1854      PT SHORT TERM GOAL #1   Title Patient tolerates wearing TTA prosthesis >4 hrs total / day without increase in skin issues. (All STGs Target Date: 06/30/2020)    Time 1    Period Months    Status On-going    Target Date 06/30/20      PT SHORT TERM GOAL #2   Title Patient donnes transtibial prosthesis with minimal assist.    Time 1    Period Months    Target Date 06/30/20      PT SHORT TERM GOAL #3   Title Sit to stand pulling on parallel bars with  moderate assist.    Time 1    Period Months    Status On-going    Target Date 06/30/20      PT SHORT TERM GOAL #4   Title Patient able to stand with parallel bars & bilateral prostheses statically with minimal assist for 2 minutes.    Time 1    Period Months    Status On-going    Target Date 06/30/20             PT Long Term Goals - 06/01/20 1854      PT LONG TERM GOAL #1   Title Patient tolerates wear of Transtibial prosthesis and Transfemoral liner  >80% of awake hours and Transfemoral prosthesis >5hours total / day without skin or limb pain issues.  (All Target Date 11/25/2020)    Time 6    Period Months    Status On-going    Target Date 11/26/19      PT LONG TERM GOAL #2   Title Patient verbalizes and demonstrates understading of proper prosthetic care including donning both prostheses.    Time 6    Period Months    Status On-going    Target Date 11/26/19      PT LONG TERM GOAL #3   Title Patient's standing balance with RW & bilateral prostheses modified independent: sit to/from stand chairs with armrests, reaching 5" anteriorly & knee level and managing clothes.    Time 6    Period Months    Status On-going    Target Date 11/26/19      PT LONG TERM GOAL #4   Title Patient ambulates 38' around furniture with RW & bilateral prostheses modified independent.    Time 6    Period Months    Status On-going    Target Date 11/26/19                 Plan - 06/15/20 1356    Clinical Impression Statement Prosthetist present for PT session to make changes to prostheses as needed for alignment.  PT recommended using stiffer TTA prosthetic foot to  control stance dorsiflexion which results in knee flexion moment.  Prosthetist tighten current foot but plans to order a different foot.  PT instructed in proper step width of TFA prosthesis as adducting results in lateral hip instability.    Personal Factors and Comorbidities Comorbidity 3+;Education;Fitness;Past/Current  Experience;Social Background;Time since onset of injury/illness/exacerbation    Comorbidities Rt TTA, Lt TFA, DM, HTN, PVD, depression, right Carpal Tunnel Release surgery    Examination-Activity Limitations Locomotion Level;Squat;Stand;Transfers;Stairs    Examination-Participation Restrictions Community Activity    Stability/Clinical Decision Making Evolving/Moderate complexity    Rehab Potential Good    PT Frequency 2x / week    PT Duration Other (comment)   6 months (26 weeks)   PT Treatment/Interventions ADLs/Self Care Home Management;DME Instruction;Gait training;Stair training;Functional mobility training;Therapeutic activities;Therapeutic exercise;Balance training;Neuromuscular re-education;Patient/family education;Prosthetic Training;Manual techniques;Scar mobilization;Passive range of motion;Vasopneumatic Device;Vestibular    PT Next Visit Plan review exercises, check skin, standing & gait,    Consulted and Agree with Plan of Care Patient;Family member/caregiver    Family Member Consulted nephew / caregiver Marikay Alar           Patient will benefit from skilled therapeutic intervention in order to improve the following deficits and impairments:  Abnormal gait, Cardiopulmonary status limiting activity, Decreased activity tolerance, Decreased balance, Decreased endurance, Decreased knowledge of use of DME, Decreased mobility, Decreased range of motion, Decreased skin integrity, Decreased scar mobility, Decreased strength, Dizziness, Increased edema, Impaired flexibility, Postural dysfunction, Prosthetic Dependency, Obesity, Pain  Visit Diagnosis: Muscle weakness (generalized)  Unsteadiness on feet  Other abnormalities of gait and mobility  Abnormal posture  Pain in right hand  Stiffness of left hip, not elsewhere classified     Problem List Patient Active Problem List   Diagnosis Date Noted  . Right below-knee amputee (HCC) 01/23/2019  . Gangrene of right foot (HCC)  01/17/2019  . History of transmetatarsal amputation of right foot (HCC) 12/13/2018  . At risk for adverse drug event 11/27/2018  . Osteomyelitis of third toe of right foot (HCC) 11/12/2018  . Pain in finger of right hand 02/20/2018  . Atherosclerosis of native arteries of the extremities with gangrene (HCC) 11/01/2017  . Unilateral AKA, left (HCC)   . Post-operative pain   . OSA (obstructive sleep apnea)   . Benign essential HTN   . DM (diabetes mellitus), secondary, uncontrolled, with peripheral vascular complications (HCC)   . Hyperkalemia   . Leukocytosis   . Acute blood loss anemia   . Amputation stump infection (HCC) 08/23/2017  . Amputation of left lower extremity above knee upon examination (HCC) 08/23/2017  . PAD (peripheral artery disease) (HCC) 07/23/2017  . Cellulitis 06/10/2017  . Hyponatremia 06/10/2017  . Fever 06/10/2017  . Rash 05/24/2017  . HTN (hypertension)   . Allergic rhinitis   . Insomnia   . Depression     Vladimir Faster PT, DPT 06/15/2020, 4:00 PM  [facility] [address] [place], [place], [address] Phone: [phone]   Fax:  [phone]  Name: Danielle Buchanan MRN: [id number] Date of Birth: [birth year]

## 2020-06-20 ENCOUNTER — Ambulatory Visit (INDEPENDENT_AMBULATORY_CARE_PROVIDER_SITE_OTHER): Payer: Medicare HMO | Admitting: Physical Therapy

## 2020-06-20 ENCOUNTER — Encounter: Payer: Self-pay | Admitting: Physical Therapy

## 2020-06-20 ENCOUNTER — Telehealth: Payer: Self-pay | Admitting: Physical Therapy

## 2020-06-20 ENCOUNTER — Other Ambulatory Visit: Payer: Self-pay

## 2020-06-20 DIAGNOSIS — R293 Abnormal posture: Secondary | ICD-10-CM

## 2020-06-20 DIAGNOSIS — R2681 Unsteadiness on feet: Secondary | ICD-10-CM

## 2020-06-20 DIAGNOSIS — M25652 Stiffness of left hip, not elsewhere classified: Secondary | ICD-10-CM | POA: Diagnosis not present

## 2020-06-20 DIAGNOSIS — R2689 Other abnormalities of gait and mobility: Secondary | ICD-10-CM | POA: Diagnosis not present

## 2020-06-20 DIAGNOSIS — M79641 Pain in right hand: Secondary | ICD-10-CM

## 2020-06-20 DIAGNOSIS — M6281 Muscle weakness (generalized): Secondary | ICD-10-CM | POA: Diagnosis not present
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
## 2020-06-20 NOTE — Telephone Encounter (Signed)
Open in error

## 2020-06-22 ENCOUNTER — Other Ambulatory Visit: Payer: Self-pay

## 2020-06-22 ENCOUNTER — Ambulatory Visit (INDEPENDENT_AMBULATORY_CARE_PROVIDER_SITE_OTHER): Payer: Medicare HMO | Admitting: Physical Therapy

## 2020-06-22 DIAGNOSIS — R2689 Other abnormalities of gait and mobility: Secondary | ICD-10-CM | POA: Diagnosis not present

## 2020-06-22 DIAGNOSIS — R2681 Unsteadiness on feet: Secondary | ICD-10-CM

## 2020-06-22 DIAGNOSIS — M6281 Muscle weakness (generalized): Secondary | ICD-10-CM

## 2020-06-22 DIAGNOSIS — R293 Abnormal posture: Secondary | ICD-10-CM

## 2020-06-22 DIAGNOSIS — M79641 Pain in right hand: Secondary | ICD-10-CM

## 2020-06-22 DIAGNOSIS — M25652 Stiffness of left hip, not elsewhere classified: Secondary | ICD-10-CM | POA: Diagnosis not present

## 2020-06-22 NOTE — Therapy (Signed)
[facility] Physical Therapy [address] [place], [place], [address] Phone: [phone]   Fax:  [phone]  Physical Therapy Treatment  Patient Details  Name: Danielle Buchanan MRN: [id number] Date of Birth: 11/28/1955 Referring Provider (PT): [name], MD   Encounter Date: 06/22/2020   PT End of Session - 06/22/20 1409    Visit Number 8    Number of Visits 52    Date for PT Re-Evaluation 08/26/20    Authorization Type [employer]    Authorization Time Period $10 co-pay,    Progress Note Due on Visit 10    PT Start Time 1307    PT Stop Time 1346    PT Time Calculation (min) 39 min    Equipment Utilized During Treatment Gait belt    Activity Tolerance Patient tolerated treatment well    Behavior During Therapy Flat affect;WFL for tasks assessed/performed           Past Medical History:  Diagnosis Date  . Allergic rhinitis   . Depression   . Diabetes (HCC)   . Dyslipidemia   . HTN (hypertension)   . Insomnia   . OSA (obstructive sleep apnea)    no cpap  . Urinary incontinence     Past Surgical History:  Procedure Laterality Date  . ABDOMINAL AORTOGRAM W/LOWER EXTREMITY N/A 06/24/2017   Procedure: ABDOMINAL AORTOGRAM W/LOWER EXTREMITY;  Surgeon: Maeola Harman, MD;  Location: [facility] INVASIVE CV LAB;  Service: Cardiovascular;  Laterality: N/A;  . ABDOMINAL AORTOGRAM W/LOWER EXTREMITY Right 10/09/2017   Procedure: ABDOMINAL AORTOGRAM W/LOWER EXTREMITY;  Surgeon: Maeola Harman, MD;  Location: [facility] INVASIVE CV LAB;  Service: Cardiovascular;  Laterality: Right;  . AMPUTATION Left 07/23/2017   Procedure: AMPUTATION  BELOW KNEE;  Surgeon: Maeola Harman, MD;  Location: [facility] OR;  Service: Vascular;  Laterality: Left;  . AMPUTATION Left 08/12/2017   Procedure: REVISION BELOW KNEE;  Surgeon: Maeola Harman, MD;  Location: [facility] OR;  Service: Vascular;  Laterality: Left;  . AMPUTATION Left 08/27/2017   Procedure: left  ABOVE KNEE AMPUTATION;  Surgeon: Maeola Harman, MD;  Location: [facility] OR;  Service: Vascular;  Laterality: Left;  . AMPUTATION Left 08/30/2017   Procedure: REVISION AMPUTATION ABOVE KNEE;  Surgeon: Nada Libman, MD;  Location: [facility] OR;  Service: Vascular;  Laterality: Left;  . AMPUTATION Right 11/04/2017   Procedure: AMPUTATION RIGHT FOURTH TOE;  Surgeon: Maeola Harman, MD;  Location: [facility] OR;  Service: Vascular;  Laterality: Right;  . AMPUTATION Right 11/12/2018   Procedure: AMPUTATION OF second, third and fifth toes;  Surgeon: Nada Libman, MD;  Location: [facility] OR;  Service: Vascular;  Laterality: Right;  . AMPUTATION Right 01/19/2019   Procedure: AMPUTATION BELOW KNEE;  Surgeon: Sherren Kerns, MD;  Location: [facility] OR;  Service: Vascular;  Laterality: Right;  . APPLICATION OF WOUND VAC Left 08/27/2017   Procedure: APPLICATION OF WOUND VAC;  Surgeon: Maeola Harman, MD;  Location: [facility] OR;  Service: Vascular;  Laterality: Left;  . CARPAL TUNNEL RELEASE Right   . COLONOSCOPY    . LOWER EXTREMITY ANGIOGRAM Right 11/12/2018   Procedure: ANGIOGRAM OF Right LEG;  Surgeon: Nada Libman, MD;  Location: [facility] OR;  Service: Vascular;  Laterality: Right;  . LOWER EXTREMITY INTERVENTION Right 10/09/2017   Procedure: LOWER EXTREMITY INTERVENTION;  Surgeon: Maeola Harman, MD;  Location: [facility] INVASIVE CV LAB;  Service: Cardiovascular;  Laterality: Right;  . TOE AMPUTATION Right 11/12/2018   2ND 3RD &  5TH TOE   . TRANSMETATARSAL AMPUTATION Right 11/19/2018   Procedure: TRANSMETATARSAL AMPUTATION REVISION;  Surgeon: Nada Libman, MD;  Location: [facility] OR;  Service: Vascular;  Laterality: Right;    There were no vitals filed for this visit.   Subjective Assessment - 06/22/20 1348    Subjective She relays still having some redness in her left leg but no pain today.    Patient Stated Goals Walk with 2 prostheses in home mostly and maybe limited community.    Pain Onset More  than a month ago             [facility] Adult PT Treatment/Exercise - 06/22/20 0001      Transfers   Transfers Sit to Stand;Stand to Sit;Lateral/Scoot Transfers    Sit to Stand 3: Mod assist;With upper extremity assist;With armrests;From chair/3-in-1;Other (comment)   pulling on //bars, TFA prosthetic knee locking upon arising   Sit to Stand Details Tactile cues for weight shifting;Visual cues for safe use of DME/AE;Verbal cues for sequencing;Verbal cues for technique;Verbal cues for safe use of DME/AE    Stand to Sit 4: Min assist;With upper extremity assist;With armrests;To chair/3-in-1;Other (comment)   using //bars to lower into w/c, prosthetic knee locked   Stand to Sit Details (indicate cue type and reason) Tactile cues for weight shifting;Visual cues for safe use of DME/AE;Verbal cues for technique;Verbal cues for safe use of DME/AE    Lateral/Scoot Transfers --      Ambulation/Gait   Ambulation/Gait Yes    Ambulation/Gait Assistance 2: Max assist   1 person assist today   Ambulation Distance (Feet) 5 Feet   x3   Assistive device Parallel bars;Prostheses   right TTA & left TFA locked knee prostheses   Gait Pattern Step-to pattern;Decreased step length - right;Decreased stance time - left;Decreased stride length;Antalgic;Lateral hip instability;Trunk flexed;Wide base of support;Abducted - left;Poor foot clearance - left;Poor foot clearance - right      Exercises   Other Exercises  in  bars: standing weight shifts lateral holding 5 sec with UE support X 10 reps bilat, then seated rest break and worked on standing with equal weight shift while sliding arms up/down on bars      Prosthetics   Prosthetic Care Comments  wear double shrinkers for 30 minutes prior to wearing TTA prosthesis.  Increase TTA wear to 2 hrs 3x/day.      Current prosthetic wear tolerance (days/week)  daily (TTA prosthesis & TFA liner only)    Current prosthetic wear tolerance (#hours/day)  TTA prosthesis & liner 2 hrs  2x/day.  TFA liner only most of awake hours.     Current prosthetic weight-bearing tolerance (hours/day)  No pain or discomfort with standing in //bars.     Edema pitting edema TTA limb    Residual limb condition  right TTA has medium red rash over distal limb but no signs of infection.  bulbous edematous shape.  no open areas.    TFA no issues noted.     Education Provided Residual limb care;Proper Donning;Correct ply sock adjustment;Proper wear schedule/adjustment;Other (comment)   see prosthetic care comments                   PT Short Term Goals - 06/01/20 1854      PT SHORT TERM GOAL #1   Title Patient tolerates wearing TTA prosthesis >4 hrs total / day without increase in skin issues. (All STGs Target Date: 06/30/2020)    Time 1  Period Months    Status On-going    Target Date 06/30/20      PT SHORT TERM GOAL #2   Title Patient donnes transtibial prosthesis with minimal assist.    Time 1    Period Months    Target Date 06/30/20      PT SHORT TERM GOAL #3   Title Sit to stand pulling on parallel bars with moderate assist.    Time 1    Period Months    Status On-going    Target Date 06/30/20      PT SHORT TERM GOAL #4   Title Patient able to stand with parallel bars & bilateral prostheses statically with minimal assist for 2 minutes.    Time 1    Period Months    Status On-going    Target Date 06/30/20             PT Long Term Goals - 06/01/20 1854      PT LONG TERM GOAL #1   Title Patient tolerates wear of Transtibial prosthesis and Transfemoral liner  >80% of awake hours and Transfemoral prosthesis >5hours total / day without skin or limb pain issues.  (All Target Date 11/25/2020)    Time 6    Period Months    Status On-going    Target Date 11/26/19      PT LONG TERM GOAL #2   Title Patient verbalizes and demonstrates understading of proper prosthetic care including donning both prostheses.    Time 6    Period Months    Status On-going    Target  Date 11/26/19      PT LONG TERM GOAL #3   Title Patient's standing balance with RW & bilateral prostheses modified independent: sit to/from stand chairs with armrests, reaching 5" anteriorly & knee level and managing clothes.    Time 6    Period Months    Status On-going    Target Date 11/26/19      PT LONG TERM GOAL #4   Title Patient ambulates 42' around furniture with RW & bilateral prostheses modified independent.    Time 6    Period Months    Status On-going    Target Date 11/26/19                 Plan - 06/22/20 1410    Clinical Impression Statement Session focused on prosthetic education for wear time and shrinker time along with standing, weight shifting, and ambuation in bars for safety with close wheelchair follow. PT will continue to progress as able.    Personal Factors and Comorbidities Comorbidity 3+;Education;Fitness;Past/Current Experience;Social Background;Time since onset of injury/illness/exacerbation    Comorbidities Rt TTA, Lt TFA, DM, HTN, PVD, depression, right Carpal Tunnel Release surgery    Examination-Activity Limitations Locomotion Level;Squat;Stand;Transfers;Stairs    Examination-Participation Restrictions Community Activity    Stability/Clinical Decision Making Evolving/Moderate complexity    Rehab Potential Good    PT Frequency 2x / week    PT Duration Other (comment)   6 months (26 weeks)   PT Treatment/Interventions ADLs/Self Care Home Management;DME Instruction;Gait training;Stair training;Functional mobility training;Therapeutic activities;Therapeutic exercise;Balance training;Neuromuscular re-education;Patient/family education;Prosthetic Training;Manual techniques;Scar mobilization;Passive range of motion;Vasopneumatic Device;Vestibular    PT Next Visit Plan check skin, standing & gait,    Consulted and Agree with Plan of Care Patient;Family member/caregiver    Family Member Consulted nephew / caregiver Marikay Alar           Patient  will benefit from skilled therapeutic intervention  in order to improve the following deficits and impairments:  Abnormal gait, Cardiopulmonary status limiting activity, Decreased activity tolerance, Decreased balance, Decreased endurance, Decreased knowledge of use of DME, Decreased mobility, Decreased range of motion, Decreased skin integrity, Decreased scar mobility, Decreased strength, Dizziness, Increased edema, Impaired flexibility, Postural dysfunction, Prosthetic Dependency, Obesity, Pain  Visit Diagnosis: Muscle weakness (generalized)  Unsteadiness on feet  Other abnormalities of gait and mobility  Abnormal posture  Pain in right hand  Stiffness of left hip, not elsewhere classified     Problem List Patient Active Problem List   Diagnosis Date Noted  . Right below-knee amputee (HCC) 01/23/2019  . Gangrene of right foot (HCC) 01/17/2019  . History of transmetatarsal amputation of right foot (HCC) 12/13/2018  . At risk for adverse drug event 11/27/2018  . Osteomyelitis of third toe of right foot (HCC) 11/12/2018  . Pain in finger of right hand 02/20/2018  . Atherosclerosis of native arteries of the extremities with gangrene (HCC) 11/01/2017  . Unilateral AKA, left (HCC)   . Post-operative pain   . OSA (obstructive sleep apnea)   . Benign essential HTN   . DM (diabetes mellitus), secondary, uncontrolled, with peripheral vascular complications (HCC)   . Hyperkalemia   . Leukocytosis   . Acute blood loss anemia   . Amputation stump infection (HCC) 08/23/2017  . Amputation of left lower extremity above knee upon examination (HCC) 08/23/2017  . PAD (peripheral artery disease) (HCC) 07/23/2017  . Cellulitis 06/10/2017  . Hyponatremia 06/10/2017  . Fever 06/10/2017  . Rash 05/24/2017  . HTN (hypertension)   . Allergic rhinitis   . Insomnia   . Depression     Birdie RiddleBrian R Sandralee Tarkington,PT,DPT 06/22/2020, 2:12 PM  [facility] [address] [place], [place], [address] Phone: [phone]   Fax:  [phone]  Name: Danielle Buchanan MRN: [id number] Date of Birth: 10-03-1956

## 2020-06-27 ENCOUNTER — Encounter: Payer: Self-pay | Admitting: Physical Therapy

## 2020-06-27 ENCOUNTER — Other Ambulatory Visit: Payer: Self-pay

## 2020-06-27 ENCOUNTER — Ambulatory Visit (INDEPENDENT_AMBULATORY_CARE_PROVIDER_SITE_OTHER): Payer: Medicare HMO | Admitting: Physical Therapy

## 2020-06-27 DIAGNOSIS — M79641 Pain in right hand: Secondary | ICD-10-CM | POA: Diagnosis not present

## 2020-06-27 DIAGNOSIS — M25652 Stiffness of left hip, not elsewhere classified: Secondary | ICD-10-CM | POA: Diagnosis not present

## 2020-06-27 DIAGNOSIS — R293 Abnormal posture: Secondary | ICD-10-CM | POA: Diagnosis not present

## 2020-06-27 DIAGNOSIS — R2681 Unsteadiness on feet: Secondary | ICD-10-CM | POA: Diagnosis not present

## 2020-06-27 DIAGNOSIS — R2689 Other abnormalities of gait and mobility: Secondary | ICD-10-CM | POA: Diagnosis not present

## 2020-06-27 DIAGNOSIS — M6281 Muscle weakness (generalized): Secondary | ICD-10-CM | POA: Diagnosis not present

## 2020-06-27 NOTE — Therapy (Signed)
[facility] [address] [place], [place], [address] Phone: [phone]   Fax:  [phone]  Physical Therapy Treatment  Patient Details  Name: Danielle Buchanan MRN: [id number] Date of Birth: April 28, 1956 Referring Provider (PT): Maurice Small, MD   Encounter Date: 06/27/2020   PT End of Session - 06/27/20 2040    Visit Number 9    Number of Visits 52    Date for PT Re-Evaluation 08/26/20    Authorization Type [employer]    Authorization Time Period $10 co-pay,    Progress Note Due on Visit 10    PT Start Time 1355    PT Stop Time 1433    PT Time Calculation (min) 38 min    Equipment Utilized During Treatment Gait belt    Activity Tolerance Patient tolerated treatment well    Behavior During Therapy Flat affect;WFL for tasks assessed/performed           Past Medical History:  Diagnosis Date  . Allergic rhinitis   . Depression   . Diabetes ([place])   . Dyslipidemia   . HTN (hypertension)   . Insomnia   . OSA (obstructive sleep apnea)    no cpap  . Urinary incontinence     Past Surgical History:  Procedure Laterality Date  . ABDOMINAL AORTOGRAM W/LOWER EXTREMITY N/A 06/24/2017   Procedure: ABDOMINAL AORTOGRAM W/LOWER EXTREMITY;  Surgeon: Waynetta Sandy, MD;  Location: [facility];  Service: Cardiovascular;  Laterality: N/A;  . ABDOMINAL AORTOGRAM W/LOWER EXTREMITY Right 10/09/2017   Procedure: ABDOMINAL AORTOGRAM W/LOWER EXTREMITY;  Surgeon: Waynetta Sandy, MD;  Location: [facility];  Service: Cardiovascular;  Laterality: Right;  . AMPUTATION Left 07/23/2017   Procedure: AMPUTATION  BELOW KNEE;  Surgeon: Waynetta Sandy, MD;  Location: [facility];  Service: Vascular;  Laterality: Left;  . AMPUTATION Left 08/12/2017   Procedure: REVISION BELOW KNEE;  Surgeon: Waynetta Sandy, MD;  Location: [place];  Service: Vascular;  Laterality: Left;  . AMPUTATION Left 08/27/2017   Procedure: left  ABOVE KNEE AMPUTATION;  Surgeon: Waynetta Sandy, MD;  Location: [place];  Service: Vascular;  Laterality: Left;  . AMPUTATION Left 08/30/2017   Procedure: REVISION AMPUTATION ABOVE KNEE;  Surgeon: Serafina Mitchell, MD;  Location: [place];  Service: Vascular;  Laterality: Left;  . AMPUTATION Right 11/04/2017   Procedure: AMPUTATION RIGHT FOURTH TOE;  Surgeon: Waynetta Sandy, MD;  Location: [place];  Service: Vascular;  Laterality: Right;  . AMPUTATION Right 11/12/2018   Procedure: AMPUTATION OF second, third and fifth toes;  Surgeon: Serafina Mitchell, MD;  Location: [place];  Service: Vascular;  Laterality: Right;  . AMPUTATION Right 01/19/2019   Procedure: AMPUTATION BELOW KNEE;  Surgeon: Elam Dutch, MD;  Location: [place];  Service: Vascular;  Laterality: Right;  . APPLICATION OF WOUND VAC Left 08/27/2017   Procedure: APPLICATION OF WOUND VAC;  Surgeon: Waynetta Sandy, MD;  Location: [place];  Service: Vascular;  Laterality: Left;  . CARPAL TUNNEL RELEASE Right   . COLONOSCOPY    . LOWER EXTREMITY ANGIOGRAM Right 11/12/2018   Procedure: ANGIOGRAM OF Right LEG;  Surgeon: Serafina Mitchell, MD;  Location: [place];  Service: Vascular;  Laterality: Right;  . LOWER EXTREMITY INTERVENTION Right 10/09/2017   Procedure: LOWER EXTREMITY INTERVENTION;  Surgeon: Waynetta Sandy, MD;  Location: [facility];  Service: Cardiovascular;  Laterality: Right;  . TOE AMPUTATION Right 11/12/2018   2ND 3RD &  5TH TOE   . TRANSMETATARSAL AMPUTATION Right 11/19/2018   Procedure: TRANSMETATARSAL AMPUTATION REVISION;  Surgeon: Serafina Mitchell, MD;  Location: [place];  Service: Vascular;  Laterality: Right;    There were no vitals filed for this visit.   Subjective Assessment - 06/27/20 1355    Subjective She has been wearing TTA prosthesis 2-3 hours 2x/day and wearing Vive shrinker.    Patient Stated Goals Walk with 2 prostheses in home mostly and maybe limited community.     Currently in Pain? Yes    Pain Score 7     Pain Location Wrist    Pain Orientation Right    Pain Descriptors / Indicators Aching;Sore    Pain Type Chronic pain    Pain Onset More than a month ago    Pain Frequency Constant    Aggravating Factors  weight bearing on RW    Pain Relieving Factors medication, heat &We  ice                             [facility] Adult PT Treatment/Exercise - 06/27/20 1455      Transfers   Transfers Sit to Stand;Stand to Sit;Lateral/Scoot Transfers    Sit to Stand 3: Mod assist;With upper extremity assist;With armrests;From chair/3-in-1;Other (comment)   pulling on //bars & to RW with LUE on walker & RUE w/c push   Sit to Stand Details Tactile cues for weight shifting;Visual cues for safe use of DME/AE;Verbal cues for sequencing;Verbal cues for technique;Verbal cues for safe use of DME/AE;Manual facilitation for weight bearing;Manual facilitation for weight shifting    Sit to Stand Details (indicate cue type and reason) stood to RW with TFA prosthesis locked prior to arising.     Stand to Sit 4: Min assist;With upper extremity assist;With armrests;To chair/3-in-1;Other (comment)   using //bars to lower into w/c & from RW; with TFA locked   Stand to Sit Details (indicate cue type and reason) Tactile cues for weight shifting;Visual cues for safe use of DME/AE;Verbal cues for technique;Verbal cues for safe use of DME/AE;Manual facilitation for weight shifting      Ambulation/Gait   Ambulation/Gait Yes    Ambulation/Gait Assistance 3: Mod assist;2: Max assist   1 person assist //bars & 2 person RW   Ambulation/Gait Assistance Details verbal & manual cues on weight shift over stance limb, upright posture, RLE knee extension in stance    Ambulation Distance (Feet) 5 Feet   5' in //bars, 5' & 7' with RW   Assistive device Parallel bars;Prostheses;Rolling walker   right TTA & left TFA locked knee prostheses   Gait Pattern Step-to pattern;Decreased step  length - right;Decreased stance time - left;Decreased stride length;Antalgic;Lateral hip instability;Trunk flexed;Wide base of support;Abducted - left;Poor foot clearance - left;Poor foot clearance - right    Ambulation Surface Level;Indoor      Neuro Re-ed    Neuro Re-ed Details  MinA once positioned for standing 30 second & 60 second with RW support with TFA prosthesis locked, verbal cues on RLE knee extension & upright posture      Exercises   Other Exercises  --      Prosthetics   Prosthetic Care Comments  PT able to donne TTA prosthesis with flexible inner liner & no socks today.  She reported that it felt better with standing & gait. Continue to wear double shrinkers for 30 minutes prior to wearing TTA prosthesis.  Increase TTA wear to  3 hrs on, 2hrs off for 2-3X/day    Current prosthetic wear tolerance (days/week)  daily (TTA prosthesis & TFA liner only)    Current prosthetic wear tolerance (#hours/day)  TTA prosthesis & liner 2-2.5 hrs 2x/day.  TFA liner only most of awake hours.     Current prosthetic weight-bearing tolerance (hours/day)  No pain or discomfort with standing in //bars.     Edema pitting edema TTA limb    Residual limb condition  right TTA has medium red rash over distal limb but no signs of infection.  bulbous edematous shape.  no open areas.    TFA no issues noted.     Education Provided Residual limb care;Proper Donning;Correct ply sock adjustment;Proper wear schedule/adjustment;Other (comment)   see prosthetic care comments   Person(s) Educated Patient;Caregiver(s)    Education Method Explanation;Demonstration;Tactile cues;Verbal cues    Education Method Verbalized understanding;Tactile cues required;Verbal cues required;Needs further instruction    Donning Prosthesis Supervision   TTA supervision                   PT Short Term Goals - 06/27/20 2041      PT SHORT TERM GOAL #1   Title Patient tolerates wearing TTA prosthesis >4 hrs total / day without  increase in skin issues. (All STGs Target Date: 06/30/2020)    Baseline MET 06/27/2020    Time 1    Period Months    Status Achieved    Target Date 06/30/20      PT SHORT TERM GOAL #2   Title Patient donnes transtibial prosthesis with minimal assist.    Baseline MET 06/27/2020    Time 1    Period Months    Status Achieved    Target Date 06/30/20      PT SHORT TERM GOAL #3   Title Sit to stand pulling on parallel bars with moderate assist.    Baseline MET 06/27/2020    Time 1    Period Months    Status Achieved    Target Date 06/30/20      PT SHORT TERM GOAL #4   Title Patient able to stand with parallel bars & bilateral prostheses statically with minimal assist for 2 minutes.    Baseline MET 06/27/2020    Time 1    Period Months    Status Achieved    Target Date 06/30/20             PT Long Term Goals - 06/01/20 1854      PT LONG TERM GOAL #1   Title Patient tolerates wear of Transtibial prosthesis and Transfemoral liner  >80% of awake hours and Transfemoral prosthesis >5hours total / day without skin or limb pain issues.  (All Target Date 11/25/2020)    Time 6    Period Months    Status On-going    Target Date 11/26/19      PT LONG TERM GOAL #2   Title Patient verbalizes and demonstrates understading of proper prosthetic care including donning both prostheses.    Time 6    Period Months    Status On-going    Target Date 11/26/19      PT LONG TERM GOAL #3   Title Patient's standing balance with RW & bilateral prostheses modified independent: sit to/from stand chairs with armrests, reaching 5" anteriorly & knee level and managing clothes.    Time 6    Period Months    Status On-going    Target Date 11/26/19  PT LONG TERM GOAL #4   Title Patient ambulates 69' around furniture with RW & bilateral prostheses modified independent.    Time 6    Period Months    Status On-going    Target Date 11/26/19                 Plan - 06/27/20 2046    Clinical  Impression Statement Patient met all short term goals set for initial 30-day period.  PT progressed standing & gait to RW today with assistance.  Patient able to use flexible inner liner due to shrinkage of TTA limb.    Personal Factors and Comorbidities Comorbidity 3+;Education;Fitness;Past/Current Experience;Social Background;Time since onset of injury/illness/exacerbation    Comorbidities Rt TTA, Lt TFA, DM, HTN, PVD, depression, right Carpal Tunnel Release surgery    Examination-Activity Limitations Locomotion Level;Squat;Stand;Transfers;Stairs    Examination-Participation Restrictions Community Activity    Stability/Clinical Decision Making Evolving/Moderate complexity    Rehab Potential Good    PT Frequency 2x / week    PT Duration Other (comment)   6 months (26 weeks)   PT Treatment/Interventions ADLs/Self Care Home Management;DME Instruction;Gait training;Stair training;Functional mobility training;Therapeutic activities;Therapeutic exercise;Balance training;Neuromuscular re-education;Patient/family education;Prosthetic Training;Manual techniques;Scar mobilization;Passive range of motion;Vasopneumatic Device;Vestibular    PT Next Visit Plan work towards to updated STGs,  standing & gait with RW    Consulted and Agree with Plan of Care Patient;Family member/caregiver    Family Member Consulted nephew / caregiver Mora Bellman           Patient will benefit from skilled therapeutic intervention in order to improve the following deficits and impairments:  Abnormal gait, Cardiopulmonary status limiting activity, Decreased activity tolerance, Decreased balance, Decreased endurance, Decreased knowledge of use of DME, Decreased mobility, Decreased range of motion, Decreased skin integrity, Decreased scar mobility, Decreased strength, Dizziness, Increased edema, Impaired flexibility, Postural dysfunction, Prosthetic Dependency, Obesity, Pain  Visit Diagnosis: Muscle weakness  (generalized)  Unsteadiness on feet  Other abnormalities of gait and mobility  Abnormal posture  Pain in right hand  Stiffness of left hip, not elsewhere classified     Problem List Patient Active Problem List   Diagnosis Date Noted  . Right below-knee amputee ([place]) 01/23/2019  . Gangrene of right foot ([place]) 01/17/2019  . History of transmetatarsal amputation of right foot ([place]) 12/13/2018  . At risk for adverse drug event 11/27/2018  . Osteomyelitis of third toe of right foot ([place]) 11/12/2018  . Pain in finger of right hand 02/20/2018  . Atherosclerosis of native arteries of the extremities with gangrene ([place]) 11/01/2017  . Unilateral AKA, left ([place])   . Post-operative pain   . OSA (obstructive sleep apnea)   . Benign essential HTN   . DM (diabetes mellitus), secondary, uncontrolled, with peripheral vascular complications ([place])   . Hyperkalemia   . Leukocytosis   . Acute blood loss anemia   . Amputation stump infection ([place]) 08/23/2017  . Amputation of left lower extremity above knee upon examination ([place]) 08/23/2017  . PAD (peripheral artery disease) ([place]) 07/23/2017  . Cellulitis 06/10/2017  . Hyponatremia 06/10/2017  . Fever 06/10/2017  . Rash 05/24/2017  . HTN (hypertension)   . Allergic rhinitis   . Insomnia   . Depression     Jamey Reas PT, DPT 06/27/2020, 9:09 PM  [facility] Physical [facility] [address] [place], [place], [address] Phone: [phone]   Fax:  [phone]  Name: Danielle Buchanan MRN: [id number] Date of Birth: 24-Dec-1955

## 2020-06-29 ENCOUNTER — Other Ambulatory Visit: Payer: Self-pay

## 2020-06-29 ENCOUNTER — Ambulatory Visit (INDEPENDENT_AMBULATORY_CARE_PROVIDER_SITE_OTHER): Payer: Medicare HMO | Admitting: Physical Therapy

## 2020-06-29 DIAGNOSIS — R293 Abnormal posture: Secondary | ICD-10-CM | POA: Diagnosis not present

## 2020-06-29 DIAGNOSIS — R2681 Unsteadiness on feet: Secondary | ICD-10-CM | POA: Diagnosis not present

## 2020-06-29 DIAGNOSIS — M6281 Muscle weakness (generalized): Secondary | ICD-10-CM | POA: Diagnosis not present

## 2020-06-29 DIAGNOSIS — M25652 Stiffness of left hip, not elsewhere classified: Secondary | ICD-10-CM

## 2020-06-29 DIAGNOSIS — R2689 Other abnormalities of gait and mobility: Secondary | ICD-10-CM

## 2020-06-29 DIAGNOSIS — M79641 Pain in right hand: Secondary | ICD-10-CM

## 2020-06-29 NOTE — Therapy (Signed)
[facility] Physical Therapy [address] [place], [place], [address] Phone: [phone]   Fax:  [phone]  Physical Therapy Treatment/Progress note Progress Note reporting period 05/30/20 to 06/29/20  See below for objective and subjective measurements relating to patients progress with PT.   Patient Details  Name: Danielle Buchanan MRN: [id number] Date of Birth: November 16, 1955 Referring Provider (PT): Maurice Small, MD   Encounter Date: 06/29/2020   PT End of Session - 06/29/20 1610    Visit Number 10    Number of Visits 32    Date for PT Re-Evaluation 08/26/20    Authorization Type [employer]    Authorization Time Period $10 co-pay,    Progress Note Due on Visit 20    PT Start Time 1302    PT Stop Time 1345    PT Time Calculation (min) 43 min    Equipment Utilized During Treatment Gait belt    Activity Tolerance Patient tolerated treatment well    Behavior During Therapy Flat affect;WFL for tasks assessed/performed           Past Medical History:  Diagnosis Date   Allergic rhinitis    Depression    Diabetes ([place])    Dyslipidemia    HTN (hypertension)    Insomnia    OSA (obstructive sleep apnea)    no cpap   Urinary incontinence     Past Surgical History:  Procedure Laterality Date   ABDOMINAL AORTOGRAM W/LOWER EXTREMITY N/A 06/24/2017   Procedure: ABDOMINAL AORTOGRAM W/LOWER EXTREMITY;  Surgeon: Waynetta Sandy, MD;  Location: [place] [facility];  Service: Cardiovascular;  Laterality: N/A;   ABDOMINAL AORTOGRAM W/LOWER EXTREMITY Right 10/09/2017   Procedure: ABDOMINAL AORTOGRAM W/LOWER EXTREMITY;  Surgeon: Waynetta Sandy, MD;  Location: [place] [facility];  Service: Cardiovascular;  Laterality: Right;   AMPUTATION Left 07/23/2017   Procedure: AMPUTATION  BELOW KNEE;  Surgeon: Waynetta Sandy, MD;  Location: [place];  Service: Vascular;  Laterality: Left;   AMPUTATION Left 08/12/2017   Procedure: REVISION  BELOW KNEE;  Surgeon: Waynetta Sandy, MD;  Location: [place];  Service: Vascular;  Laterality: Left;   AMPUTATION Left 08/27/2017   Procedure: left ABOVE KNEE AMPUTATION;  Surgeon: Waynetta Sandy, MD;  Location: [place];  Service: Vascular;  Laterality: Left;   AMPUTATION Left 08/30/2017   Procedure: REVISION AMPUTATION ABOVE KNEE;  Surgeon: Serafina Mitchell, MD;  Location: [place];  Service: Vascular;  Laterality: Left;   AMPUTATION Right 11/04/2017   Procedure: AMPUTATION RIGHT FOURTH TOE;  Surgeon: Waynetta Sandy, MD;  Location: [place];  Service: Vascular;  Laterality: Right;   AMPUTATION Right 11/12/2018   Procedure: AMPUTATION OF second, third and fifth toes;  Surgeon: Serafina Mitchell, MD;  Location: [place];  Service: Vascular;  Laterality: Right;   AMPUTATION Right 01/19/2019   Procedure: AMPUTATION BELOW KNEE;  Surgeon: Elam Dutch, MD;  Location: [place];  Service: Vascular;  Laterality: Right;   APPLICATION OF WOUND VAC Left 08/27/2017   Procedure: APPLICATION OF WOUND VAC;  Surgeon: Waynetta Sandy, MD;  Location: [place];  Service: Vascular;  Laterality: Left;   CARPAL TUNNEL RELEASE Right    COLONOSCOPY     LOWER EXTREMITY ANGIOGRAM Right 11/12/2018   Procedure: Cyril Loosen OF Right LEG;  Surgeon: Serafina Mitchell, MD;  Location: [place];  Service: Vascular;  Laterality: Right;   LOWER EXTREMITY INTERVENTION Right 10/09/2017   Procedure: LOWER EXTREMITY INTERVENTION;  Surgeon: Waynetta Sandy,  MD;  Location: [place] [facility];  Service: Cardiovascular;  Laterality: Right;   TOE AMPUTATION Right 11/12/2018   2ND 3RD & 5TH TOE    TRANSMETATARSAL AMPUTATION Right 11/19/2018   Procedure: TRANSMETATARSAL AMPUTATION REVISION;  Surgeon: Serafina Mitchell, MD;  Location: [place];  Service: Vascular;  Laterality: Right;    There were no vitals filed for this visit.   Subjective Assessment - 06/29/20 1606    Subjective She relays some redness  and swelling in her Rt leg but no other complaints. PT doffed prosthesis to check skin and some redness to report but no warmth or skin breakdown.    Patient Stated Goals Walk with 2 prostheses in home mostly and maybe limited community.    Pain Onset More than a month ago                             [facility] Adult PT Treatment/Exercise - 06/29/20 0001      Transfers   Sit to Stand 3: Mod assist;With upper extremity assist;With armrests;From chair/3-in-1;Other (comment)    Sit to Stand Details Tactile cues for weight shifting;Visual cues for safe use of DME/AE;Verbal cues for sequencing;Verbal cues for technique;Verbal cues for safe use of DME/AE;Manual facilitation for weight bearing;Manual facilitation for weight shifting    Stand to Sit 4: Min assist;With upper extremity assist;With armrests;To chair/3-in-1;Other (comment)    Stand to Sit Details (indicate cue type and reason) Tactile cues for weight shifting;Visual cues for safe use of DME/AE;Verbal cues for technique;Verbal cues for safe use of DME/AE;Manual facilitation for weight shifting      Ambulation/Gait   Ambulation/Gait Yes    Ambulation/Gait Assistance 3: Mod assist    Ambulation Distance (Feet) 5 Feet   x 4 reps in bars with WC follow   Assistive device Parallel bars;Prostheses;Rolling walker    Gait Pattern Step-to pattern;Decreased step length - right;Decreased stance time - left;Decreased stride length;Antalgic;Lateral hip instability;Trunk flexed;Wide base of support;Abducted - left;Poor foot clearance - left;Poor foot clearance - right      Exercises   Other Exercises  in  bars: standing weight shifts lateral holding 5 sec with UE support X 10 reps bilat, then seated rest break and moved to standing with bilat UE support and alternating removing one UE support  X10 reps bilat, 2 sets with sitting rest break in between                    PT Short Term Goals - 06/29/20 1613      PT SHORT TERM  GOAL #1   Title Patient tolerates wearing TTA prosthesis >4 hrs total / day without increase in skin issues. (All STGs Target Date: 06/30/2020)    Baseline MET 06/27/2020    Time 1    Period Months    Status Achieved    Target Date 06/30/20      PT SHORT TERM GOAL #2   Title Patient donnes transtibial prosthesis with minimal assist.    Baseline MET 06/27/2020    Time 1    Period Months    Status Achieved    Target Date 06/30/20      PT SHORT TERM GOAL #3   Title Sit to stand pulling on parallel bars with moderate assist.    Baseline MET 06/27/2020    Time 1    Period Months    Status Achieved    Target Date 06/30/20  PT SHORT TERM GOAL #4   Title Patient able to stand with parallel bars & bilateral prostheses statically with minimal assist for 2 minutes.    Baseline MET 06/27/2020    Time 1    Period Months    Status Achieved    Target Date 06/30/20             PT Long Term Goals - 06/29/20 1613      PT LONG TERM GOAL #1   Title Patient tolerates wear of Transtibial prosthesis and Transfemoral liner  >80% of awake hours and Transfemoral prosthesis >5hours total / day without skin or limb pain issues.  (All Target Date 11/25/2020)    Time 6    Period Months    Status On-going      PT LONG TERM GOAL #2   Title Patient verbalizes and demonstrates understading of proper prosthetic care including donning both prostheses.    Time 6    Period Months    Status On-going      PT LONG TERM GOAL #3   Title Patient's standing balance with RW & bilateral prostheses modified independent: sit to/from stand chairs with armrests, reaching 5" anteriorly & knee level and managing clothes.    Time 6    Period Months    Status On-going      PT LONG TERM GOAL #4   Title Patient ambulates 58' around furniture with RW & bilateral prostheses modified independent.    Time 6    Period Months    Status On-going                 Plan - 06/29/20 1611    Clinical Impression  Statement She is progressing well with PT in all areas. Patient met all short term goals set for initial 30-day period.  PT progressed standing & gait to RW today with assistance. She did have more rotation of her left prosthesis noted and it needed to be adjusted after every bout of ambulation. She will continue to benefit from skilled PT to address her functional deficits and decrease caregiver burden.    Personal Factors and Comorbidities Comorbidity 3+;Education;Fitness;Past/Current Experience;Social Background;Time since onset of injury/illness/exacerbation    Comorbidities Rt TTA, Lt TFA, DM, HTN, PVD, depression, right Carpal Tunnel Release surgery    Examination-Activity Limitations Locomotion Level;Squat;Stand;Transfers;Stairs    Examination-Participation Restrictions Community Activity    Stability/Clinical Decision Making Evolving/Moderate complexity    Rehab Potential Good    PT Frequency 2x / week    PT Duration Other (comment)   6 months (26 weeks)   PT Treatment/Interventions ADLs/Self Care Home Management;DME Instruction;Gait training;Stair training;Functional mobility training;Therapeutic activities;Therapeutic exercise;Balance training;Neuromuscular re-education;Patient/family education;Prosthetic Training;Manual techniques;Scar mobilization;Passive range of motion;Vasopneumatic Device;Vestibular    PT Next Visit Plan work towards to updated STGs,  standing & gait with RW    Consulted and Agree with Plan of Care Patient;Family member/caregiver    Family Member Consulted nephew / caregiver Mora Bellman           Patient will benefit from skilled therapeutic intervention in order to improve the following deficits and impairments:  Abnormal gait, Cardiopulmonary status limiting activity, Decreased activity tolerance, Decreased balance, Decreased endurance, Decreased knowledge of use of DME, Decreased mobility, Decreased range of motion, Decreased skin integrity, Decreased scar  mobility, Decreased strength, Dizziness, Increased edema, Impaired flexibility, Postural dysfunction, Prosthetic Dependency, Obesity, Pain  Visit Diagnosis: Muscle weakness (generalized)  Unsteadiness on feet  Other abnormalities of gait and mobility  Abnormal posture  Pain in right hand  Stiffness of left hip, not elsewhere classified     Problem List Patient Active Problem List   Diagnosis Date Noted   Right below-knee amputee ([place]) 01/23/2019   Gangrene of right foot ([place]) 01/17/2019   History of transmetatarsal amputation of right foot ([place]) 12/13/2018   At risk for adverse drug event 11/27/2018   Osteomyelitis of third toe of right foot ([place]) 11/12/2018   Pain in finger of right hand 02/20/2018   Atherosclerosis of native arteries of the extremities with gangrene ([place]) 11/01/2017   Unilateral AKA, left (HCC)    Post-operative pain    OSA (obstructive sleep apnea)    Benign essential HTN    DM (diabetes mellitus), secondary, uncontrolled, with peripheral vascular complications ([facility])    Hyperkalemia    Leukocytosis    Acute blood loss anemia    Amputation stump infection ([place]) 08/23/2017   Amputation of left lower extremity above knee upon examination ([place]) 08/23/2017   PAD (peripheral artery disease) ([place]) 07/23/2017   Cellulitis 06/10/2017   Hyponatremia 06/10/2017   Fever 06/10/2017   Rash 05/24/2017   HTN (hypertension)    Allergic rhinitis    Insomnia    Depression     Silvestre Mesi 06/29/2020, 4:13 PM  [facility] Physical Therapy [address] [place], [place], [address] Phone: [phone]   Fax:  [phone]  Name: Danielle Buchanan MRN: [id number] Date of Birth: 1956/08/19

## 2020-07-04 ENCOUNTER — Other Ambulatory Visit: Payer: Self-pay

## 2020-07-04 ENCOUNTER — Ambulatory Visit (INDEPENDENT_AMBULATORY_CARE_PROVIDER_SITE_OTHER): Payer: Medicare HMO | Admitting: Physical Therapy

## 2020-07-04 ENCOUNTER — Encounter: Payer: Self-pay | Admitting: Physical Therapy

## 2020-07-04 DIAGNOSIS — R2681 Unsteadiness on feet: Secondary | ICD-10-CM

## 2020-07-04 DIAGNOSIS — M6281 Muscle weakness (generalized): Secondary | ICD-10-CM | POA: Diagnosis not present

## 2020-07-04 DIAGNOSIS — M25652 Stiffness of left hip, not elsewhere classified: Secondary | ICD-10-CM | POA: Diagnosis not present

## 2020-07-04 DIAGNOSIS — M79641 Pain in right hand: Secondary | ICD-10-CM

## 2020-07-04 DIAGNOSIS — R2689 Other abnormalities of gait and mobility: Secondary | ICD-10-CM | POA: Diagnosis not present

## 2020-07-04 DIAGNOSIS — R293 Abnormal posture: Secondary | ICD-10-CM

## 2020-07-04 NOTE — Therapy (Signed)
[facility] Physical Therapy [address] [place], [place], [address] Phone: [phone]   Fax:  [phone]  Physical Therapy Treatment  Patient Details  Name: MALASHIA KAMAKA MRN: [id number] Date of Birth: [birth year] Referring Provider (PT): Maurice Small, MD   Encounter Date: 07/04/2020   PT End of Session - 07/04/20 1151    Visit Number 11    Number of Visits 61    Date for PT Re-Evaluation 08/26/20    Authorization Type [employer]    Authorization Time Period $10 co-pay,    Progress Note Due on Visit 20    PT Start Time 1148    PT Stop Time 1233    PT Time Calculation (min) 45 min    Equipment Utilized During Treatment Gait belt    Activity Tolerance Patient tolerated treatment well    Behavior During Therapy Flat affect;WFL for tasks assessed/performed           Past Medical History:  Diagnosis Date   Allergic rhinitis    Depression    Diabetes ([place])    Dyslipidemia    HTN (hypertension)    Insomnia    OSA (obstructive sleep apnea)    no cpap   Urinary incontinence     Past Surgical History:  Procedure Laterality Date   ABDOMINAL AORTOGRAM W/LOWER EXTREMITY N/A 06/24/2017   Procedure: ABDOMINAL AORTOGRAM W/LOWER EXTREMITY;  Surgeon: Waynetta Sandy, MD;  Location: [place] [facility];  Service: Cardiovascular;  Laterality: N/A;   ABDOMINAL AORTOGRAM W/LOWER EXTREMITY Right 10/09/2017   Procedure: ABDOMINAL AORTOGRAM W/LOWER EXTREMITY;  Surgeon: Waynetta Sandy, MD;  Location: [place] [facility];  Service: Cardiovascular;  Laterality: Right;   AMPUTATION Left 07/23/2017   Procedure: AMPUTATION  BELOW KNEE;  Surgeon: Waynetta Sandy, MD;  Location: [place];  Service: Vascular;  Laterality: Left;   AMPUTATION Left 08/12/2017   Procedure: REVISION BELOW KNEE;  Surgeon: Waynetta Sandy, MD;  Location: [place];  Service: Vascular;  Laterality: Left;   AMPUTATION Left 08/27/2017   Procedure: left  ABOVE KNEE AMPUTATION;  Surgeon: Waynetta Sandy, MD;  Location: [place];  Service: Vascular;  Laterality: Left;   AMPUTATION Left 08/30/2017   Procedure: REVISION AMPUTATION ABOVE KNEE;  Surgeon: Serafina Mitchell, MD;  Location: [place];  Service: Vascular;  Laterality: Left;   AMPUTATION Right 11/04/2017   Procedure: AMPUTATION RIGHT FOURTH TOE;  Surgeon: Waynetta Sandy, MD;  Location: [place];  Service: Vascular;  Laterality: Right;   AMPUTATION Right 11/12/2018   Procedure: AMPUTATION OF second, third and fifth toes;  Surgeon: Serafina Mitchell, MD;  Location: [place];  Service: Vascular;  Laterality: Right;   AMPUTATION Right 01/19/2019   Procedure: AMPUTATION BELOW KNEE;  Surgeon: Elam Dutch, MD;  Location: [place];  Service: Vascular;  Laterality: Right;   APPLICATION OF WOUND VAC Left 08/27/2017   Procedure: APPLICATION OF WOUND VAC;  Surgeon: Waynetta Sandy, MD;  Location: [place];  Service: Vascular;  Laterality: Left;   CARPAL TUNNEL RELEASE Right    COLONOSCOPY     LOWER EXTREMITY ANGIOGRAM Right 11/12/2018   Procedure: Cyril Loosen OF Right LEG;  Surgeon: Serafina Mitchell, MD;  Location: [place];  Service: Vascular;  Laterality: Right;   LOWER EXTREMITY INTERVENTION Right 10/09/2017   Procedure: LOWER EXTREMITY INTERVENTION;  Surgeon: Waynetta Sandy, MD;  Location: [place] [facility];  Service: Cardiovascular;  Laterality: Right;   TOE AMPUTATION Right 11/12/2018   2ND 3RD &  5TH TOE    TRANSMETATARSAL AMPUTATION Right 11/19/2018   Procedure: TRANSMETATARSAL AMPUTATION REVISION;  Surgeon: Serafina Mitchell, MD;  Location: [place];  Service: Vascular;  Laterality: Right;    There were no vitals filed for this visit.   Subjective Assessment - 07/04/20 1148    Subjective She wore TTA prosthesis 2-3 hrs 2x/day. Her right wrist is hurting since yesterday.    Patient Stated Goals Walk with 2 prostheses in home mostly and maybe limited community.     Currently in Pain? Yes    Pain Score 10-Worst pain ever    Pain Location Wrist    Pain Orientation Right    Pain Descriptors / Indicators Throbbing    Pain Type Chronic pain    Pain Onset More than a month ago    Pain Frequency Constant    Aggravating Factors  weight bearing    Pain Relieving Factors medication, ice, pariffin               Prosthetics Assessment - 07/04/20 0001      Prosthetics   Prosthetic Care Independent with Skin check;Residual limb care                        [facility] Adult PT Treatment/Exercise - 07/04/20 1150      Transfers   Sit to Stand 3: Mod assist;With upper extremity assist;With armrests;From chair/3-in-1;Other (comment)   to //bars & to RW   Sit to Stand Details Tactile cues for weight shifting;Visual cues for safe use of DME/AE;Verbal cues for sequencing;Verbal cues for technique;Verbal cues for safe use of DME/AE;Manual facilitation for weight bearing;Manual facilitation for weight shifting    Stand to Sit 4: Min assist;With upper extremity assist;With armrests;To chair/3-in-1;Other (comment)   from //bars & from RW   Stand to Sit Details (indicate cue type and reason) Tactile cues for weight shifting;Visual cues for safe use of DME/AE;Verbal cues for technique;Verbal cues for safe use of DME/AE;Manual facilitation for weight shifting      Ambulation/Gait   Ambulation/Gait Yes    Ambulation/Gait Assistance 3: Mod assist;2: Max assist   [place] 1 person in //bars & MaxA 2 person RW   Ambulation/Gait Assistance Details manual / tactile, verbal & demo cues on upright posture, wt shift over stance limb and sequence.     Ambulation Distance (Feet) 8 Feet   5' //bars & 8' RW   Assistive device Parallel bars;Prostheses;Rolling walker    Gait Pattern Step-to pattern;Decreased step length - right;Decreased stance time - left;Decreased stride length;Antalgic;Lateral hip instability;Trunk flexed;Wide base of support;Abducted - left;Poor foot  clearance - left;Poor foot clearance - right    Ambulation Surface Level;Indoor      Neuro Re-ed    Neuro Re-ed Details  Standing with RW support with demo & verbal cues on foot position to facilitate stability & upright posture.  Pt stood for 20 sec 2 reps with modA 2 person.        Exercises   Other Exercises  --      Prosthetics   Prosthetic Care Comments  PT recommended wear schedule 4 hrs on 3x/day with 2 hours off and off from 4 am to 10am.      Current prosthetic wear tolerance (days/week)  daily (TTA prosthesis & TFA liner only)    Current prosthetic wear tolerance (#hours/day)  TTA prosthesis 2-3 hours 2x/day & TFA most of awake hours.     Current prosthetic weight-bearing tolerance (hours/day)  No pain or discomfort with standing in //bars.     Edema pitting edema TTA limb    Residual limb condition  right TTA has medium red rash over distal limb but no signs of infection.  bulbous edematous shape.  no open areas.    TFA no issues noted.     Education Provided Skin check;Residual limb care;Correct ply sock adjustment;Proper Donning;Proper wear schedule/adjustment    Person(s) Educated Patient;Caregiver(s)    Education Method Explanation;Demonstration;Tactile cues;Verbal cues    Education Method Verbalized understanding;Returned demonstration;Tactile cues required;Verbal cues required;Needs further instruction                    PT Short Term Goals - 06/29/20 1613      PT SHORT TERM GOAL #1   Title Patient tolerates wearing TTA prosthesis >4 hrs total / day without increase in skin issues. (All STGs Target Date: 06/30/2020)    Baseline MET 06/27/2020    Time 1    Period Months    Status Achieved    Target Date 06/30/20      PT SHORT TERM GOAL #2   Title Patient donnes transtibial prosthesis with minimal assist.    Baseline MET 06/27/2020    Time 1    Period Months    Status Achieved    Target Date 06/30/20      PT SHORT TERM GOAL #3   Title Sit to stand  pulling on parallel bars with moderate assist.    Baseline MET 06/27/2020    Time 1    Period Months    Status Achieved    Target Date 06/30/20      PT SHORT TERM GOAL #4   Title Patient able to stand with parallel bars & bilateral prostheses statically with minimal assist for 2 minutes.    Baseline MET 06/27/2020    Time 1    Period Months    Status Achieved    Target Date 06/30/20             PT Long Term Goals - 06/29/20 1613      PT LONG TERM GOAL #1   Title Patient tolerates wear of Transtibial prosthesis and Transfemoral liner  >80% of awake hours and Transfemoral prosthesis >5hours total / day without skin or limb pain issues.  (All Target Date 11/25/2020)    Time 6    Period Months    Status On-going      PT LONG TERM GOAL #2   Title Patient verbalizes and demonstrates understading of proper prosthetic care including donning both prostheses.    Time 6    Period Months    Status On-going      PT LONG TERM GOAL #3   Title Patient's standing balance with RW & bilateral prostheses modified independent: sit to/from stand chairs with armrests, reaching 5" anteriorly & knee level and managing clothes.    Time 6    Period Months    Status On-going      PT LONG TERM GOAL #4   Title Patient ambulates 33' around furniture with RW & bilateral prostheses modified independent.    Time 6    Period Months    Status On-going                 Plan - 07/04/20 1151    Clinical Impression Statement PT worked on standing & gait with bilateral prostheses.  She had more difficulty today extending right knee in stance which may be  related to increased right hand pain today.    Personal Factors and Comorbidities Comorbidity 3+;Education;Fitness;Past/Current Experience;Social Background;Time since onset of injury/illness/exacerbation    Comorbidities Rt TTA, Lt TFA, DM, HTN, PVD, depression, right Carpal Tunnel Release surgery    Examination-Activity Limitations Locomotion  Level;Squat;Stand;Transfers;Stairs    Examination-Participation Restrictions Community Activity    Stability/Clinical Decision Making Evolving/Moderate complexity    Rehab Potential Good    PT Frequency 2x / week    PT Duration Other (comment)   6 months (26 weeks)   PT Treatment/Interventions ADLs/Self Care Home Management;DME Instruction;Gait training;Stair training;Functional mobility training;Therapeutic activities;Therapeutic exercise;Balance training;Neuromuscular re-education;Patient/family education;Prosthetic Training;Manual techniques;Scar mobilization;Passive range of motion;Vasopneumatic Device;Vestibular    PT Next Visit Plan work towards to updated STGs,  standing & gait with RW    Consulted and Agree with Plan of Care Patient;Family member/caregiver    Family Member Consulted nephew / caregiver Mora Bellman           Patient will benefit from skilled therapeutic intervention in order to improve the following deficits and impairments:  Abnormal gait, Cardiopulmonary status limiting activity, Decreased activity tolerance, Decreased balance, Decreased endurance, Decreased knowledge of use of DME, Decreased mobility, Decreased range of motion, Decreased skin integrity, Decreased scar mobility, Decreased strength, Dizziness, Increased edema, Impaired flexibility, Postural dysfunction, Prosthetic Dependency, Obesity, Pain  Visit Diagnosis: Unsteadiness on feet  Muscle weakness (generalized)  Other abnormalities of gait and mobility  Abnormal posture  Pain in right hand  Stiffness of left hip, not elsewhere classified     Problem List Patient Active Problem List   Diagnosis Date Noted   Right below-knee amputee ([place]) 01/23/2019   Gangrene of right foot ([place]) 01/17/2019   History of transmetatarsal amputation of right foot ([place]) 12/13/2018   At risk for adverse drug event 11/27/2018   Osteomyelitis of third toe of right foot ([place]) 11/12/2018   Pain in finger  of right hand 02/20/2018   Atherosclerosis of native arteries of the extremities with gangrene ([place]) 11/01/2017   Unilateral AKA, left (HCC)    Post-operative pain    OSA (obstructive sleep apnea)    Benign essential HTN    DM (diabetes mellitus), secondary, uncontrolled, with peripheral vascular complications (HCC)    Hyperkalemia    Leukocytosis    Acute blood loss anemia    Amputation stump infection ([place]) 08/23/2017   Amputation of left lower extremity above knee upon examination ([place]) 08/23/2017   PAD (peripheral artery disease) ([place]) 07/23/2017   Cellulitis 06/10/2017   Hyponatremia 06/10/2017   Fever 06/10/2017   Rash 05/24/2017   HTN (hypertension)    Allergic rhinitis    Insomnia    Depression     Jamey Reas PT, DPT 07/04/2020, 12:59 PM  [facility] [address] [place], [place], [address] Phone: [phone]   Fax:  [phone]  Name: LELANI GARNETT MRN: [id number] Date of Birth: September 11, 1956

## 2020-07-06 ENCOUNTER — Ambulatory Visit (INDEPENDENT_AMBULATORY_CARE_PROVIDER_SITE_OTHER): Payer: Medicare HMO | Admitting: Physical Therapy

## 2020-07-06 ENCOUNTER — Other Ambulatory Visit: Payer: Self-pay

## 2020-07-06 ENCOUNTER — Encounter: Payer: Self-pay | Admitting: Physical Therapy

## 2020-07-06 DIAGNOSIS — M6281 Muscle weakness (generalized): Secondary | ICD-10-CM | POA: Diagnosis not present

## 2020-07-06 DIAGNOSIS — R293 Abnormal posture: Secondary | ICD-10-CM | POA: Diagnosis not present

## 2020-07-06 DIAGNOSIS — R2689 Other abnormalities of gait and mobility: Secondary | ICD-10-CM

## 2020-07-06 DIAGNOSIS — M79641 Pain in right hand: Secondary | ICD-10-CM | POA: Diagnosis not present

## 2020-07-06 DIAGNOSIS — R2681 Unsteadiness on feet: Secondary | ICD-10-CM

## 2020-07-06 DIAGNOSIS — M25652 Stiffness of left hip, not elsewhere classified: Secondary | ICD-10-CM | POA: Diagnosis not present

## 2020-07-06 NOTE — Therapy (Signed)
[facility] Physical Therapy [address] [place], [place], [address] Phone: [phone]   Fax:  [phone]  Physical Therapy Treatment  Patient Details  Name: Danielle Buchanan MRN: [id number] Date of Birth: October 20, 1955 Referring Provider (PT): [name], MD   Encounter Date: 07/06/2020   PT End of Session - 07/06/20 1152    Visit Number 12    Number of Visits 52    Date for PT Re-Evaluation 08/26/20    Authorization Type [employer]    Authorization Time Period $10 co-pay,    Progress Note Due on Visit 20    PT Start Time 1148    PT Stop Time 1230    PT Time Calculation (min) 42 min    Equipment Utilized During Treatment Gait belt    Activity Tolerance Patient tolerated treatment well    Behavior During Therapy Flat affect;WFL for tasks assessed/performed           Past Medical History:  Diagnosis Date  . Allergic rhinitis   . Depression   . Diabetes (HCC)   . Dyslipidemia   . HTN (hypertension)   . Insomnia   . OSA (obstructive sleep apnea)    no cpap  . Urinary incontinence     Past Surgical History:  Procedure Laterality Date  . ABDOMINAL AORTOGRAM W/LOWER EXTREMITY N/A 06/24/2017   Procedure: ABDOMINAL AORTOGRAM W/LOWER EXTREMITY;  Surgeon: Maeola Harman, MD;  Location: [facility] INVASIVE CV LAB;  Service: Cardiovascular;  Laterality: N/A;  . ABDOMINAL AORTOGRAM W/LOWER EXTREMITY Right 10/09/2017   Procedure: ABDOMINAL AORTOGRAM W/LOWER EXTREMITY;  Surgeon: Maeola Harman, MD;  Location: [facility] INVASIVE CV LAB;  Service: Cardiovascular;  Laterality: Right;  . AMPUTATION Left 07/23/2017   Procedure: AMPUTATION  BELOW KNEE;  Surgeon: Maeola Harman, MD;  Location: [facility] OR;  Service: Vascular;  Laterality: Left;  . AMPUTATION Left 08/12/2017   Procedure: REVISION BELOW KNEE;  Surgeon: Maeola Harman, MD;  Location: [facility] OR;  Service: Vascular;  Laterality: Left;  . AMPUTATION Left 08/27/2017   Procedure: left  ABOVE KNEE AMPUTATION;  Surgeon: Maeola Harman, MD;  Location: [facility] OR;  Service: Vascular;  Laterality: Left;  . AMPUTATION Left 08/30/2017   Procedure: REVISION AMPUTATION ABOVE KNEE;  Surgeon: Nada Libman, MD;  Location: [facility] OR;  Service: Vascular;  Laterality: Left;  . AMPUTATION Right 11/04/2017   Procedure: AMPUTATION RIGHT FOURTH TOE;  Surgeon: Maeola Harman, MD;  Location: [facility] OR;  Service: Vascular;  Laterality: Right;  . AMPUTATION Right 11/12/2018   Procedure: AMPUTATION OF second, third and fifth toes;  Surgeon: Nada Libman, MD;  Location: [facility] OR;  Service: Vascular;  Laterality: Right;  . AMPUTATION Right 01/19/2019   Procedure: AMPUTATION BELOW KNEE;  Surgeon: Sherren Kerns, MD;  Location: [facility] OR;  Service: Vascular;  Laterality: Right;  . APPLICATION OF WOUND VAC Left 08/27/2017   Procedure: APPLICATION OF WOUND VAC;  Surgeon: Maeola Harman, MD;  Location: [facility] OR;  Service: Vascular;  Laterality: Left;  . CARPAL TUNNEL RELEASE Right   . COLONOSCOPY    . LOWER EXTREMITY ANGIOGRAM Right 11/12/2018   Procedure: ANGIOGRAM OF Right LEG;  Surgeon: Nada Libman, MD;  Location: [facility] OR;  Service: Vascular;  Laterality: Right;  . LOWER EXTREMITY INTERVENTION Right 10/09/2017   Procedure: LOWER EXTREMITY INTERVENTION;  Surgeon: Maeola Harman, MD;  Location: [facility] INVASIVE CV LAB;  Service: Cardiovascular;  Laterality: Right;  . TOE AMPUTATION Right 11/12/2018   2ND 3RD &  5TH TOE   . TRANSMETATARSAL AMPUTATION Right 11/19/2018   Procedure: TRANSMETATARSAL AMPUTATION REVISION;  Surgeon: Nada Libman, MD;  Location: [facility] OR;  Service: Vascular;  Laterality: Right;    There were no vitals filed for this visit.   Subjective Assessment - 07/06/20 1148    Subjective She wore TTA prosthesis 2-3 hrs 2x/day. Her right wrist is hurting since yesterday.    Patient Stated Goals Walk with 2 prostheses in home mostly and maybe limited community.     Pain Onset More than a month ago                             [facility] Adult PT Treatment/Exercise - 07/06/20 1145      Transfers   Transfers Sit to Stand;Stand to [employer] Transfers    Sit to Stand 3: Mod assist;With upper extremity assist;With armrests;From chair/3-in-1;Other (comment)   to //bars & to RW   Sit to Stand Details Tactile cues for weight shifting;Visual cues for safe use of DME/AE;Verbal cues for sequencing;Verbal cues for technique;Verbal cues for safe use of DME/AE;Manual facilitation for weight bearing;Manual facilitation for weight shifting    Stand to Sit 4: Min assist;With upper extremity assist;With armrests;To chair/3-in-1;Other (comment)   from //bars & from RW   Stand to Sit Details (indicate cue type and reason) Tactile cues for weight shifting;Visual cues for safe use of DME/AE;Verbal cues for technique;Verbal cues for safe use of DME/AE;Manual facilitation for weight shifting    Stand Pivot Transfers 2: Max assist   RW & TTA/TFA prostheses, 2nd person for safety   Stand Pivot Transfer Details (indicate cue type and reason) PT demo & verbal cues prior and manual & verbal cues during transfer.       Ambulation/Gait   Ambulation/Gait Yes    Ambulation/Gait Assistance 3: Mod assist;2: Max assist   ModA 1 person in //bars & ModA 2 person RW   Ambulation/Gait Assistance Details excessive UE weight bearing on //bars, PT manually controlling left TFA movement and blocking / extending right knee in stance     Ambulation Distance (Feet) 8 Feet   5' //bars & 8' RW   Assistive device Parallel bars;Prostheses;Rolling walker    Gait Pattern Step-to pattern;Decreased step length - right;Decreased stance time - left;Decreased stride length;Antalgic;Lateral hip instability;Trunk flexed;Wide base of support;Abducted - left;Poor foot clearance - left;Poor foot clearance - right    Ambulation Surface Level;Indoor      Neuro Re-ed    Neuro Re-ed Details  Standing  with RW support with demo & verbal cues on foot position to facilitate stability & upright posture.  Pt stood for 40 sec 2 reps with modA 2 person.        Prosthetics   Prosthetic Care Comments  PT recommended wear schedule 4 hrs on 3x/day with 2 hours off and off from 4 am to 10am.      Current prosthetic wear tolerance (days/week)  daily (TTA prosthesis & TFA liner only)    Current prosthetic wear tolerance (#hours/day)  4 hrs 3x/day    Current prosthetic weight-bearing tolerance (hours/day)  No pain or discomfort with standing in //bars.     Edema pitting edema TTA limb    Residual limb condition  right TTA has medium red rash over distal limb but no signs of infection.  bulbous edematous shape.  no open areas.    TFA no issues noted.     Education  Provided Skin check;Residual limb care;Correct ply sock adjustment;Proper Donning;Proper wear schedule/adjustment    Person(s) Educated Patient;Caregiver(s)    Education Method Explanation;Demonstration    Education Method Verbalized understanding;Verbal cues required;Needs further instruction                    PT Short Term Goals - 07/06/20 1647      PT SHORT TERM GOAL #1   Title Patient tolerates wearing TTA prosthesis >10 hrs total / day without increase in skin issues. (All STGs Target Date: 07/29/2020)    Time 1    Period Months    Status Revised    Target Date 07/29/20      PT SHORT TERM GOAL #2   Title Patient donnes transtibial prosthesis with supervision    Time 1    Period Months    Status Revised    Target Date 07/29/20      PT SHORT TERM GOAL #3   Title Sit to stand w/c to RW with moderate assist.    Time 1    Period Months    Status Revised    Target Date 07/29/20      PT SHORT TERM GOAL #4   Title Stand pivot transfer with RW & bilateral prostheses with moderate assist.    Time 1    Period Months    Status New    Target Date 07/29/20      PT SHORT TERM GOAL #5   Title Patient ambulates 10' with RW  & bilateral prostheses with maxA.    Time 1    Period Months    Status New    Target Date 07/29/20             PT Long Term Goals - 06/29/20 1613      PT LONG TERM GOAL #1   Title Patient tolerates wear of Transtibial prosthesis and Transfemoral liner  >80% of awake hours and Transfemoral prosthesis >5hours total / day without skin or limb pain issues.  (All Target Date 11/25/2020)    Time 6    Period Months    Status On-going      PT LONG TERM GOAL #2   Title Patient verbalizes and demonstrates understading of proper prosthetic care including donning both prostheses.    Time 6    Period Months    Status On-going      PT LONG TERM GOAL #3   Title Patient's standing balance with RW & bilateral prostheses modified independent: sit to/from stand chairs with armrests, reaching 5" anteriorly & knee level and managing clothes.    Time 6    Period Months    Status On-going      PT LONG TERM GOAL #4   Title Patient ambulates 5950' around furniture with RW & bilateral prostheses modified independent.    Time 6    Period Months    Status On-going                 Plan - 07/06/20 1153    Clinical Impression Statement PT instructed and began stand pivot transfers with RW today and sit/stand without pulling on bars.  Patient's residual limb is tolerating increased wear without redness.    Personal Factors and Comorbidities Comorbidity 3+;Education;Fitness;Past/Current Experience;Social Background;Time since onset of injury/illness/exacerbation    Comorbidities Rt TTA, Lt TFA, DM, HTN, PVD, depression, right Carpal Tunnel Release surgery    Examination-Activity Limitations Locomotion Level;Squat;Stand;Transfers;Stairs    Examination-Participation Restrictions Community Activity  Stability/Clinical Decision Making Evolving/Moderate complexity    Rehab Potential Good    PT Frequency 2x / week    PT Duration Other (comment)   6 months (26 weeks)   PT Treatment/Interventions  ADLs/Self Care Home Management;DME Instruction;Gait training;Stair training;Functional mobility training;Therapeutic activities;Therapeutic exercise;Balance training;Neuromuscular re-education;Patient/family education;Prosthetic Training;Manual techniques;Scar mobilization;Passive range of motion;Vasopneumatic Device;Vestibular    PT Next Visit Plan work towards to updated STGs,  standing & gait with RW    Consulted and Agree with Plan of Care Patient;Family member/caregiver    Family Member Consulted nephew / caregiver Marikay Alar           Patient will benefit from skilled therapeutic intervention in order to improve the following deficits and impairments:  Abnormal gait, Cardiopulmonary status limiting activity, Decreased activity tolerance, Decreased balance, Decreased endurance, Decreased knowledge of use of DME, Decreased mobility, Decreased range of motion, Decreased skin integrity, Decreased scar mobility, Decreased strength, Dizziness, Increased edema, Impaired flexibility, Postural dysfunction, Prosthetic Dependency, Obesity, Pain  Visit Diagnosis: Unsteadiness on feet  Muscle weakness (generalized)  Other abnormalities of gait and mobility  Abnormal posture  Pain in right hand  Stiffness of left hip, not elsewhere classified     Problem List Patient Active Problem List   Diagnosis Date Noted  . Right below-knee amputee (HCC) 01/23/2019  . Gangrene of right foot (HCC) 01/17/2019  . History of transmetatarsal amputation of right foot (HCC) 12/13/2018  . At risk for adverse drug event 11/27/2018  . Osteomyelitis of third toe of right foot (HCC) 11/12/2018  . Pain in finger of right hand 02/20/2018  . Atherosclerosis of native arteries of the extremities with gangrene (HCC) 11/01/2017  . Unilateral AKA, left (HCC)   . Post-operative pain   . OSA (obstructive sleep apnea)   . Benign essential HTN   . DM (diabetes mellitus), secondary, uncontrolled, with peripheral  vascular complications (HCC)   . Hyperkalemia   . Leukocytosis   . Acute blood loss anemia   . Amputation stump infection (HCC) 08/23/2017  . Amputation of left lower extremity above knee upon examination (HCC) 08/23/2017  . PAD (peripheral artery disease) (HCC) 07/23/2017  . Cellulitis 06/10/2017  . Hyponatremia 06/10/2017  . Fever 06/10/2017  . Rash 05/24/2017  . HTN (hypertension)   . Allergic rhinitis   . Insomnia   . Depression     Vladimir Faster PT, DPT 07/06/2020, 4:52 PM  [facility] Physical Therapy [address] [place], [place], [address] Phone: [phone]   Fax:  [phone]  Name: Danielle Buchanan MRN: [id number] Date of Birth: [birth year]

## 2020-07-11 ENCOUNTER — Ambulatory Visit (INDEPENDENT_AMBULATORY_CARE_PROVIDER_SITE_OTHER): Payer: Medicare HMO | Admitting: Physical Therapy

## 2020-07-11 ENCOUNTER — Encounter: Payer: Self-pay | Admitting: Physical Therapy

## 2020-07-11 ENCOUNTER — Other Ambulatory Visit: Payer: Self-pay

## 2020-07-11 DIAGNOSIS — M25652 Stiffness of left hip, not elsewhere classified: Secondary | ICD-10-CM | POA: Diagnosis not present

## 2020-07-11 DIAGNOSIS — R2681 Unsteadiness on feet: Secondary | ICD-10-CM

## 2020-07-11 DIAGNOSIS — M6281 Muscle weakness (generalized): Secondary | ICD-10-CM | POA: Diagnosis not present

## 2020-07-11 DIAGNOSIS — M79641 Pain in right hand: Secondary | ICD-10-CM | POA: Diagnosis not present

## 2020-07-11 DIAGNOSIS — R293 Abnormal posture: Secondary | ICD-10-CM | POA: Diagnosis not present

## 2020-07-11 DIAGNOSIS — R2689 Other abnormalities of gait and mobility: Secondary | ICD-10-CM | POA: Diagnosis not present

## 2020-07-11 NOTE — Therapy (Signed)
[facility] [address] [place], [place], [address] Phone: [phone]   Fax:  [phone]  Physical Therapy Treatment  Patient Details  Name: Danielle Buchanan MRN: [id number] Date of Birth: 11/17/1955 Referring Provider (PT): [name], MD   Encounter Date: 07/11/2020   PT End of Session - 07/11/20 1258    Visit Number 13    Number of Visits 52    Date for PT Re-Evaluation 08/26/20    Authorization Type [employer]    Authorization Time Period $10 co-pay,    Progress Note Due on Visit 20    PT Start Time 1258    PT Stop Time 1345    PT Time Calculation (min) 47 min    Equipment Utilized During Treatment Gait belt    Activity Tolerance Patient tolerated treatment well    Behavior During Therapy Flat affect;WFL for tasks assessed/performed           Past Medical History:  Diagnosis Date  . Allergic rhinitis   . Depression   . Diabetes (HCC)   . Dyslipidemia   . HTN (hypertension)   . Insomnia   . OSA (obstructive sleep apnea)    no cpap  . Urinary incontinence     Past Surgical History:  Procedure Laterality Date  . ABDOMINAL AORTOGRAM W/LOWER EXTREMITY N/A 06/24/2017   Procedure: ABDOMINAL AORTOGRAM W/LOWER EXTREMITY;  Surgeon: Maeola Harman, MD;  Location: [facility] INVASIVE CV LAB;  Service: Cardiovascular;  Laterality: N/A;  . ABDOMINAL AORTOGRAM W/LOWER EXTREMITY Right 10/09/2017   Procedure: ABDOMINAL AORTOGRAM W/LOWER EXTREMITY;  Surgeon: Maeola Harman, MD;  Location: [facility] INVASIVE CV LAB;  Service: Cardiovascular;  Laterality: Right;  . AMPUTATION Left 07/23/2017   Procedure: AMPUTATION  BELOW KNEE;  Surgeon: Maeola Harman, MD;  Location: [facility] OR;  Service: Vascular;  Laterality: Left;  . AMPUTATION Left 08/12/2017   Procedure: REVISION BELOW KNEE;  Surgeon: Maeola Harman, MD;  Location: [facility] OR;  Service: Vascular;  Laterality: Left;  . AMPUTATION Left 08/27/2017   Procedure: left  ABOVE KNEE AMPUTATION;  Surgeon: Maeola Harman, MD;  Location: [facility] OR;  Service: Vascular;  Laterality: Left;  . AMPUTATION Left 08/30/2017   Procedure: REVISION AMPUTATION ABOVE KNEE;  Surgeon: Nada Libman, MD;  Location: [facility] OR;  Service: Vascular;  Laterality: Left;  . AMPUTATION Right 11/04/2017   Procedure: AMPUTATION RIGHT FOURTH TOE;  Surgeon: Maeola Harman, MD;  Location: [facility] OR;  Service: Vascular;  Laterality: Right;  . AMPUTATION Right 11/12/2018   Procedure: AMPUTATION OF second, third and fifth toes;  Surgeon: Nada Libman, MD;  Location: [facility] OR;  Service: Vascular;  Laterality: Right;  . AMPUTATION Right 01/19/2019   Procedure: AMPUTATION BELOW KNEE;  Surgeon: Sherren Kerns, MD;  Location: [facility] OR;  Service: Vascular;  Laterality: Right;  . APPLICATION OF WOUND VAC Left 08/27/2017   Procedure: APPLICATION OF WOUND VAC;  Surgeon: Maeola Harman, MD;  Location: [facility] [place] [facility] OR;  Service: Vascular;  Laterality: Left;  . CARPAL TUNNEL RELEASE Right   . COLONOSCOPY    . LOWER EXTREMITY ANGIOGRAM Right 11/12/2018   Procedure: ANGIOGRAM OF Right LEG;  Surgeon: Nada Libman, MD;  Location: [facility] OR;  Service: Vascular;  Laterality: Right;  . LOWER EXTREMITY INTERVENTION Right 10/09/2017   Procedure: LOWER EXTREMITY INTERVENTION;  Surgeon: Maeola Harman, MD;  Location: [facility] INVASIVE CV LAB;  Service: Cardiovascular;  Laterality: Right;  . TOE AMPUTATION Right 11/12/2018   2ND 3RD &  5TH TOE   . TRANSMETATARSAL AMPUTATION Right 11/19/2018   Procedure: TRANSMETATARSAL AMPUTATION REVISION;  Surgeon: Nada LibmanBrabham, Vance W, MD;  [facility];  Service: Vascular;  Laterality: Right;    There were no vitals filed for this visit.   Subjective Assessment - 07/11/20 1258    Subjective She slipped out of recliner into floor without injury. Maintainance at her apt complex helped her get up.    Patient Stated Goals Walk with 2 prostheses in home mostly and maybe  limited community.    Currently in Pain? Yes    Pain Score 10-Worst pain ever    Pain Location Hand    Pain Orientation Right    Pain Descriptors / Indicators Aching;Sore    Pain Type Chronic pain;Neuropathic pain    Pain Onset More than a month ago    Pain Frequency Constant    Aggravating Factors  using hands    Pain Relieving Factors heat, ice, medications                             [facility] Adult PT Treatment/Exercise - 07/11/20 1258      Transfers   Transfers --    Sit to Stand --    Sit to Stand Details --    Stand to Sit --    Stand to Sit Details (indicate cue type and reason) --    Stand Pivot Transfers --      Ambulation/Gait   Ambulation/Gait --    Ambulation/Gait Assistance --    Ambulation Distance (Feet) --    Assistive device --    Gait Pattern --      Neuro Re-ed    Neuro Re-ed Details  --      Prosthetics   Prosthetic Care Comments  donning with RLE extended in chair positioned anteriorly.  PT used cylinder from wipes to aid: invert socks on cylinder then invert liner over the socks.  Place cylinder in chair and place towel roll under knee.  Place distal limb against liner then roll liner onto limb. Pull socks onto limb.  Pt reports donning at home can take 30-60 minutes but this technique only took 5-15 minutes.  Chair anteriorly reduces risk of falling forward without LE support prior to donning TTA.      Current prosthetic wear tolerance (days/week)  daily (TTA prosthesis & TFA liner only)    Current prosthetic wear tolerance (#hours/day)  3-4 hrs 2x/day   PT recommended increase wear to max tolerable with removing for 2 hours before redonning for max tolerable.  Pt verbalized understanding.     Current prosthetic weight-bearing tolerance (hours/day)  No pain or discomfort with standing in //bars.     Edema pitting edema TTA limb    Residual limb condition  right TTA has medium red rash over distal limb but no signs of infection.  bulbous  edematous shape.  no open areas.    TFA no issues noted.     Education Provided Skin check;Residual limb care;Correct ply sock adjustment;Proper Donning;Proper wear schedule/adjustment;Other (comment)   see prosthetic care comments   Person(s) Educated Patient;Caregiver(s)    Education Method Explanation;Demonstration;Tactile cues;Verbal cues    Education Method Verbalized understanding;Returned demonstration;Tactile cues required;Verbal cues required;Needs further instruction                    PT Short Term Goals - 07/11/20 1716      PT SHORT TERM GOAL #  1   Title Patient tolerates wearing TTA prosthesis >10 hrs total / day without increase in skin issues. (All STGs Target Date: 07/29/2020)    Time 1    Period Months    Status On-going    Target Date 07/29/20      PT SHORT TERM GOAL #2   Title Patient donnes transtibial prosthesis with supervision    Time 1    Period Months    Status On-going    Target Date 07/29/20      PT SHORT TERM GOAL #3   Title Sit to stand w/c to RW with moderate assist.    Time 1    Period Months    Status On-going    Target Date 07/29/20      PT SHORT TERM GOAL #4   Title Stand pivot transfer with RW & bilateral prostheses with moderate assist.    Time 1    Period Months    Status On-going    Target Date 07/29/20      PT SHORT TERM GOAL #5   Title Patient ambulates 10' with RW & bilateral prostheses with maxA.    Time 1    Period Months    Status On-going    Target Date 07/29/20             PT Long Term Goals - 06/29/20 1613      PT LONG TERM GOAL #1   Title Patient tolerates wear of Transtibial prosthesis and Transfemoral liner  >80% of awake hours and Transfemoral prosthesis >5hours total / day without skin or limb pain issues.  (All Target Date 11/25/2020)    Time 6    Period Months    Status On-going      PT LONG TERM GOAL #2   Title Patient verbalizes and demonstrates understading of proper prosthetic care including  donning both prostheses.    Time 6    Period Months    Status On-going      PT LONG TERM GOAL #3   Title Patient's standing balance with RW & bilateral prostheses modified independent: sit to/from stand chairs with armrests, reaching 5" anteriorly & knee level and managing clothes.    Time 6    Period Months    Status On-going      PT LONG TERM GOAL #4   Title Patient ambulates 62' around furniture with RW & bilateral prostheses modified independent.    Time 6    Period Months    Status On-going                 Plan - 07/11/20 1258    Clinical Impression Statement Patient's hands were too painful today to tolerate standing or gait.  PT focused on donning TTA prosthesis with improved safety. Using wipes cylinder & towel roll improved safety & efficiency.    Personal Factors and Comorbidities Comorbidity 3+;Education;Fitness;Past/Current Experience;Social Background;Time since onset of injury/illness/exacerbation    Comorbidities Rt TTA, Lt TFA, DM, HTN, PVD, depression, right Carpal Tunnel Release surgery    Examination-Activity Limitations Locomotion Level;Squat;Stand;Transfers;Stairs    Examination-Participation Restrictions Community Activity    Stability/Clinical Decision Making Evolving/Moderate complexity    Rehab Potential Good    PT Frequency 2x / week    PT Duration Other (comment)   6 months (26 weeks)   PT Treatment/Interventions ADLs/Self Care Home Management;DME Instruction;Gait training;Stair training;Functional mobility training;Therapeutic activities;Therapeutic exercise;Balance training;Neuromuscular re-education;Patient/family education;Prosthetic Training;Manual techniques;Scar mobilization;Passive range of motion;Vasopneumatic Device;Vestibular    PT Next Visit  Plan work towards to updated STGs,  standing & gait with RW    Consulted and Agree with Plan of Care Patient;Family member/caregiver    Family Member Consulted nephew / caregiver Marikay Alar            Patient will benefit from skilled therapeutic intervention in order to improve the following deficits and impairments:  Abnormal gait, Cardiopulmonary status limiting activity, Decreased activity tolerance, Decreased balance, Decreased endurance, Decreased knowledge of use of DME, Decreased mobility, Decreased range of motion, Decreased skin integrity, Decreased scar mobility, Decreased strength, Dizziness, Increased edema, Impaired flexibility, Postural dysfunction, Prosthetic Dependency, Obesity, Pain  Visit Diagnosis: Unsteadiness on feet  Muscle weakness (generalized)  Other abnormalities of gait and mobility  Abnormal posture  Pain in right hand  Stiffness of left hip, not elsewhere classified     Problem List Patient Active Problem List   Diagnosis Date Noted  . Right below-knee amputee (HCC) 01/23/2019  . Gangrene of right foot (HCC) 01/17/2019  . History of transmetatarsal amputation of right foot (HCC) 12/13/2018  . At risk for adverse drug event 11/27/2018  . Osteomyelitis of third toe of right foot (HCC) 11/12/2018  . Pain in finger of right hand 02/20/2018  . Atherosclerosis of native arteries of the extremities with gangrene (HCC) 11/01/2017  . Unilateral AKA, left (HCC)   . Post-operative pain   . OSA (obstructive sleep apnea)   . Benign essential HTN   . DM (diabetes mellitus), secondary, uncontrolled, with peripheral vascular complications (HCC)   . Hyperkalemia   . Leukocytosis   . Acute blood loss anemia   . Amputation stump infection (HCC) 08/23/2017  . Amputation of left lower extremity above knee upon examination (HCC) 08/23/2017  . PAD (peripheral artery disease) (HCC) 07/23/2017  . Cellulitis 06/10/2017  . Hyponatremia 06/10/2017  . Fever 06/10/2017  . Rash 05/24/2017  . HTN (hypertension)   . Allergic rhinitis   . Insomnia   . Depression     Vladimir Faster PT, DPT 07/11/2020, 5:27 PM  [facility] [address] [place], [place], [address] Phone: [phone]   Fax:  [phone]  Name: Danielle Buchanan MRN: [id number] Date of Birth: 1955/11/17

## 2020-07-12 DIAGNOSIS — E1121 Type 2 diabetes mellitus with diabetic nephropathy: Secondary | ICD-10-CM | POA: Diagnosis not present

## 2020-07-12 DIAGNOSIS — F331 Major depressive disorder, recurrent, moderate: Secondary | ICD-10-CM | POA: Diagnosis not present

## 2020-07-13 ENCOUNTER — Other Ambulatory Visit: Payer: Self-pay

## 2020-07-13 ENCOUNTER — Encounter: Payer: Self-pay | Admitting: Physical Therapy

## 2020-07-13 ENCOUNTER — Ambulatory Visit (INDEPENDENT_AMBULATORY_CARE_PROVIDER_SITE_OTHER): Payer: Medicare HMO | Admitting: Physical Therapy

## 2020-07-13 DIAGNOSIS — M79641 Pain in right hand: Secondary | ICD-10-CM | POA: Diagnosis not present

## 2020-07-13 DIAGNOSIS — R2681 Unsteadiness on feet: Secondary | ICD-10-CM | POA: Diagnosis not present

## 2020-07-13 DIAGNOSIS — M25652 Stiffness of left hip, not elsewhere classified: Secondary | ICD-10-CM

## 2020-07-13 DIAGNOSIS — M6281 Muscle weakness (generalized): Secondary | ICD-10-CM

## 2020-07-13 DIAGNOSIS — R293 Abnormal posture: Secondary | ICD-10-CM | POA: Diagnosis not present

## 2020-07-13 DIAGNOSIS — R2689 Other abnormalities of gait and mobility: Secondary | ICD-10-CM

## 2020-07-13 NOTE — Therapy (Signed)
[facility] [address] [place], [place], [address] Phone: [phone]   Fax:  [phone]  Physical Therapy Treatment  Patient Details  Name: Danielle Buchanan MRN: [id number] Date of Birth: [birth year] Referring Provider (PT): [name], MD   Encounter Date: 07/13/2020   PT End of Session - 07/13/20 1150    Visit Number 14    Number of Visits 52    Date for PT Re-Evaluation 08/26/20    Authorization Type [employer]    Authorization Time Period $10 co-pay,    Progress Note Due on Visit 20    PT Start Time 1147    PT Stop Time 1225    PT Time Calculation (min) 38 min    Equipment Utilized During Treatment Gait belt    Activity Tolerance Patient tolerated treatment well    Behavior During Therapy Flat affect;WFL for tasks assessed/performed           Past Medical History:  Diagnosis Date  . Allergic rhinitis   . Depression   . Diabetes (HCC)   . Dyslipidemia   . HTN (hypertension)   . Insomnia   . OSA (obstructive sleep apnea)    no cpap  . Urinary incontinence     Past Surgical History:  Procedure Laterality Date  . ABDOMINAL AORTOGRAM W/LOWER EXTREMITY N/A 06/24/2017   Procedure: ABDOMINAL AORTOGRAM W/LOWER EXTREMITY;  Surgeon: Maeola Harman, MD;  Location: [facility] INVASIVE CV LAB;  Service: Cardiovascular;  Laterality: N/A;  . ABDOMINAL AORTOGRAM W/LOWER EXTREMITY Right 10/09/2017   Procedure: ABDOMINAL AORTOGRAM W/LOWER EXTREMITY;  Surgeon: Maeola Harman, MD;  Location: [facility] INVASIVE CV LAB;  Service: Cardiovascular;  Laterality: Right;  . AMPUTATION Left 07/23/2017   Procedure: AMPUTATION  BELOW KNEE;  Surgeon: Maeola Harman, MD;  Location: [facility] OR;  Service: Vascular;  Laterality: Left;  . AMPUTATION Left 08/12/2017   Procedure: REVISION BELOW KNEE;  Surgeon: Maeola Harman, MD;  Location: [facility] OR;  Service: Vascular;  Laterality: Left;  . AMPUTATION Left 08/27/2017   Procedure: left  ABOVE KNEE AMPUTATION;  Surgeon: Maeola Harman, MD;  Location: [facility] OR;  Service: Vascular;  Laterality: Left;  . AMPUTATION Left 08/30/2017   Procedure: REVISION AMPUTATION ABOVE KNEE;  Surgeon: Nada Libman, MD;  Location: [facility] OR;  Service: Vascular;  Laterality: Left;  . AMPUTATION Right 11/04/2017   Procedure: AMPUTATION RIGHT FOURTH TOE;  Surgeon: Maeola Harman, MD;  Location: [facility] OR;  Service: Vascular;  Laterality: Right;  . AMPUTATION Right 11/12/2018   Procedure: AMPUTATION OF second, third and fifth toes;  Surgeon: Nada Libman, MD;  Location: [facility] OR;  Service: Vascular;  Laterality: Right;  . AMPUTATION Right 01/19/2019   Procedure: AMPUTATION BELOW KNEE;  Surgeon: Sherren Kerns, MD;  Location: [facility] OR;  Service: Vascular;  Laterality: Right;  . APPLICATION OF WOUND VAC Left 08/27/2017   Procedure: APPLICATION OF WOUND VAC;  Surgeon: Maeola Harman, MD;  Location: [facility] OR;  Service: Vascular;  Laterality: Left;  . CARPAL TUNNEL RELEASE Right   . COLONOSCOPY    . LOWER EXTREMITY ANGIOGRAM Right 11/12/2018   Procedure: ANGIOGRAM OF Right LEG;  Surgeon: Nada Libman, MD;  Location: [facility] OR;  Service: Vascular;  Laterality: Right;  . LOWER EXTREMITY INTERVENTION Right 10/09/2017   Procedure: LOWER EXTREMITY INTERVENTION;  Surgeon: Maeola Harman, MD;  Location: [facility] INVASIVE CV LAB;  Service: Cardiovascular;  Laterality: Right;  . TOE AMPUTATION Right 11/12/2018   2ND 3RD &  5TH TOE   . TRANSMETATARSAL AMPUTATION Right 11/19/2018   Procedure: TRANSMETATARSAL AMPUTATION REVISION;  Surgeon: Nada LibmanBrabham, Vance W, MD;  Location[facility] OR;  Service: Vascular;  Laterality: Right;    There were no vitals filed for this visit.   Subjective Assessment - 07/13/20 1147    Subjective She had teleconferene with Dr Hyman HopesWebb and she is ordering medication for depression.  She wore prosthesis for longer on Monday but was too depressed on Tuesday so did not wear  prosthesis.    Patient Stated Goals Walk with 2 prostheses in home mostly and maybe limited community.    Currently in Pain? Yes    Pain Score 10-Worst pain ever    Pain Location Hand    Pain Orientation Right;Left    Pain Descriptors / Indicators Aching;Numbness;Pins and needles    Pain Type Neuropathic pain;Chronic pain    Pain Onset More than a month ago    Pain Frequency Constant    Aggravating Factors  using hands especially pressing down    Pain Relieving Factors medication, ice, heat                             [facility] Adult PT Treatment/Exercise - 07/13/20 1147      Transfers   Transfers Sit to Stand;Stand to Dollar GeneralSit;Stand Pivot Transfers    Sit to Stand 3: Mod assist;With upper extremity assist;With armrests;From chair/3-in-1;Other (comment)   to //bars & to RW   Sit to Stand Details Tactile cues for weight shifting;Visual cues for safe use of DME/AE;Verbal cues for sequencing;Verbal cues for technique;Verbal cues for safe use of DME/AE;Manual facilitation for weight bearing;Manual facilitation for weight shifting    Stand to Sit 4: Min assist;With upper extremity assist;With armrests;To chair/3-in-1;Other (comment)   from //bars & from RW   Stand to Sit Details (indicate cue type and reason) Tactile cues for weight shifting;Visual cues for safe use of DME/AE;Verbal cues for technique;Verbal cues for safe use of DME/AE;Manual facilitation for weight shifting    Stand Pivot Transfers --      Ambulation/Gait   Ambulation/Gait Yes    Ambulation/Gait Assistance 3: Mod assist   ModA 1 person in //bars & ModA 2 person RW   Ambulation/Gait Assistance Details changed sequence to RW, RLE then LLE.  progressed to turning 90* to position in front of chair to sit.     Ambulation Distance (Feet) 10 Feet   5' //bars & RW 10' straight, 3' X2 w/90* turn   Assistive device Parallel bars;Prostheses;Rolling walker    Gait Pattern Step-to pattern;Decreased step length -  right;Decreased stance time - left;Decreased stride length;Antalgic;Lateral hip instability;Trunk flexed;Wide base of support;Abducted - left;Poor foot clearance - left;Poor foot clearance - right    Ambulation Surface Level;Indoor      Neuro Re-ed    Neuro Re-ed Details  Standing with RW support with demo & verbal cues on foot position to facilitate stability & upright posture.  Pt stood for 40 sec 2 reps with modA 1 person.        Prosthetics   Prosthetic Care Comments  PT recommended asking prosthetist to switch TTA pin to 2" from 1.5".  PT recommended wear schedule 4 hrs on 3x/day with 2 hours off and off from 4 am to 10am.      Current prosthetic wear tolerance (days/week)  daily (TTA prosthesis & TFA liner only)    Current prosthetic wear tolerance (#hours/day)  4 hrs  3x/day    Current prosthetic weight-bearing tolerance (hours/day)  No pain or discomfort with standing in //bars.     Edema pitting edema TTA limb    Residual limb condition  right TTA has medium red rash over distal limb but no signs of infection.  bulbous edematous shape.  no open areas.    TFA no issues noted.     Education Provided Skin check;Residual limb care;Correct ply sock adjustment;Proper Donning;Proper wear schedule/adjustment    Person(s) Educated Patient;Caregiver(s)    Education Method Explanation;Demonstration;Tactile cues;Verbal cues    Education Method Verbalized understanding;Returned demonstration;Tactile cues required;Verbal cues required;Needs further instruction                    PT Short Term Goals - 07/11/20 1716      PT SHORT TERM GOAL #1   Title Patient tolerates wearing TTA prosthesis >10 hrs total / day without increase in skin issues. (All STGs Target Date: 07/29/2020)    Time 1    Period Months    Status On-going    Target Date 07/29/20      PT SHORT TERM GOAL #2   Title Patient donnes transtibial prosthesis with supervision    Time 1    Period Months    Status On-going      Target Date 07/29/20      PT SHORT TERM GOAL #3   Title Sit to stand w/c to RW with moderate assist.    Time 1    Period Months    Status On-going    Target Date 07/29/20      PT SHORT TERM GOAL #4   Title Stand pivot transfer with RW & bilateral prostheses with moderate assist.    Time 1    Period Months    Status On-going    Target Date 07/29/20      PT SHORT TERM GOAL #5   Title Patient ambulates 10' with RW & bilateral prostheses with maxA.    Time 1    Period Months    Status On-going    Target Date 07/29/20             PT Long Term Goals - 06/29/20 1613      PT LONG TERM GOAL #1   Title Patient tolerates wear of Transtibial prosthesis and Transfemoral liner  >80% of awake hours and Transfemoral prosthesis >5hours total / day without skin or limb pain issues.  (All Target Date 11/25/2020)    Time 6    Period Months    Status On-going      PT LONG TERM GOAL #2   Title Patient verbalizes and demonstrates understading of proper prosthetic care including donning both prostheses.    Time 6    Period Months    Status On-going      PT LONG TERM GOAL #3   Title Patient's standing balance with RW & bilateral prostheses modified independent: sit to/from stand chairs with armrests, reaching 5" anteriorly & knee level and managing clothes.    Time 6    Period Months    Status On-going      PT LONG TERM GOAL #4   Title Patient ambulates 63' around furniture with RW & bilateral prostheses modified independent.    Time 6    Period Months    Status On-going                 Plan - 07/13/20 1150    Clinical Impression Statement  PT switched gait sequence to advance RLE first and improved stability in stance on LLE (TFA prosthesis).  PT also progressed gait to turn 90* to position to chair to sit.    Personal Factors and Comorbidities Comorbidity 3+;Education;Fitness;Past/Current Experience;Social Background;Time since onset of injury/illness/exacerbation     Comorbidities Rt TTA, Lt TFA, DM, HTN, PVD, depression, right Carpal Tunnel Release surgery    Examination-Activity Limitations Locomotion Level;Squat;Stand;Transfers;Stairs    Examination-Participation Restrictions Community Activity    Stability/Clinical Decision Making Evolving/Moderate complexity    Rehab Potential Good    PT Frequency 2x / week    PT Duration Other (comment)   6 months (26 weeks)   PT Treatment/Interventions ADLs/Self Care Home Management;DME Instruction;Gait training;Stair training;Functional mobility training;Therapeutic activities;Therapeutic exercise;Balance training;Neuromuscular re-education;Patient/family education;Prosthetic Training;Manual techniques;Scar mobilization;Passive range of motion;Vasopneumatic Device;Vestibular    PT Next Visit Plan work towards to updated STGs,  standing & gait with RW    Consulted and Agree with Plan of Care Patient;Family member/caregiver    Family Member Consulted nephew / caregiver Marikay Alar           Patient will benefit from skilled therapeutic intervention in order to improve the following deficits and impairments:  Abnormal gait, Cardiopulmonary status limiting activity, Decreased activity tolerance, Decreased balance, Decreased endurance, Decreased knowledge of use of DME, Decreased mobility, Decreased range of motion, Decreased skin integrity, Decreased scar mobility, Decreased strength, Dizziness, Increased edema, Impaired flexibility, Postural dysfunction, Prosthetic Dependency, Obesity, Pain  Visit Diagnosis: Unsteadiness on feet  Muscle weakness (generalized)  Other abnormalities of gait and mobility  Abnormal posture  Pain in right hand  Stiffness of left hip, not elsewhere classified     Problem List Patient Active Problem List   Diagnosis Date Noted  . Right below-knee amputee (HCC) 01/23/2019  . Gangrene of right foot (HCC) 01/17/2019  . History of transmetatarsal amputation of right foot (HCC)  12/13/2018  . At risk for adverse drug event 11/27/2018  . Osteomyelitis of third toe of right foot (HCC) 11/12/2018  . Pain in finger of right hand 02/20/2018  . Atherosclerosis of native arteries of the extremities with gangrene (HCC) 11/01/2017  . Unilateral AKA, left (HCC)   . Post-operative pain   . OSA (obstructive sleep apnea)   . Benign essential HTN   . DM (diabetes mellitus), secondary, uncontrolled, with peripheral vascular complications (HCC)   . Hyperkalemia   . Leukocytosis   . Acute blood loss anemia   . Amputation stump infection (HCC) 08/23/2017  . Amputation of left lower extremity above knee upon examination (HCC) 08/23/2017  . PAD (peripheral artery disease) (HCC) 07/23/2017  . Cellulitis 06/10/2017  . Hyponatremia 06/10/2017  . Fever 06/10/2017  . Rash 05/24/2017  . HTN (hypertension)   . Allergic rhinitis   . Insomnia   . Depression     Vladimir Faster PT, DPT 07/13/2020, 1:35 PM  [facility] [address] [place], [place], [address] Phone: [phone]   Fax:  [phone]  Name: NIRA VISSCHER MRN: [id number] Date of Birth: 06/25/1956

## 2020-07-18 ENCOUNTER — Other Ambulatory Visit: Payer: Self-pay

## 2020-07-18 ENCOUNTER — Ambulatory Visit (INDEPENDENT_AMBULATORY_CARE_PROVIDER_SITE_OTHER): Payer: Medicare HMO | Admitting: Physical Therapy

## 2020-07-18 ENCOUNTER — Encounter: Payer: Self-pay | Admitting: Physical Therapy

## 2020-07-18 DIAGNOSIS — R2681 Unsteadiness on feet: Secondary | ICD-10-CM

## 2020-07-18 DIAGNOSIS — M6281 Muscle weakness (generalized): Secondary | ICD-10-CM | POA: Diagnosis not present

## 2020-07-18 DIAGNOSIS — R293 Abnormal posture: Secondary | ICD-10-CM

## 2020-07-18 DIAGNOSIS — M25652 Stiffness of left hip, not elsewhere classified: Secondary | ICD-10-CM | POA: Diagnosis not present

## 2020-07-18 DIAGNOSIS — M79641 Pain in right hand: Secondary | ICD-10-CM

## 2020-07-18 DIAGNOSIS — R2689 Other abnormalities of gait and mobility: Secondary | ICD-10-CM

## 2020-07-18 NOTE — Therapy (Signed)
[facility] [address] [place], [place], [id number] Phone: [phone]   Fax:  [phone]  Physical Therapy Treatment  Patient Details  Name: Danielle ContesJanice M Pinder MRN: [id number] Date of Birth: [birth year] Referring Provider (PT): Shirlean Mylararol Webb, MD   Encounter Date: 07/18/2020   PT End of Session - 07/18/20 1257    Visit Number 15    Number of Visits 52    Date for PT Re-Evaluation 08/26/20    Authorization Type [employer]    Authorization Time Period $10 co-pay,    Progress Note Due on Visit 20    PT Start Time 1258    PT Stop Time 1344    PT Time Calculation (min) 46 min    Equipment Utilized During Treatment Gait belt    Activity Tolerance Patient tolerated treatment well    Behavior During Therapy Flat affect;WFL for tasks assessed/performed           Past Medical History:  Diagnosis Date  . Allergic rhinitis   . Depression   . Diabetes (HCC)   . Dyslipidemia   . HTN (hypertension)   . Insomnia   . OSA (obstructive sleep apnea)    no cpap  . Urinary incontinence     Past Surgical History:  Procedure Laterality Date  . ABDOMINAL AORTOGRAM W/LOWER EXTREMITY N/A 06/24/2017   Procedure: ABDOMINAL AORTOGRAM W/LOWER EXTREMITY;  Surgeon: Maeola Harmanain, Brandon Christopher, MD;  [facility];  Service: Cardiovascular;  Laterality: N/A;  . ABDOMINAL AORTOGRAM W/LOWER EXTREMITY Right 10/09/2017   Procedure: ABDOMINAL AORTOGRAM W/LOWER EXTREMITY;  Surgeon: Maeola Harmanain, Brandon Christopher, MD;  [facility];  Service: Cardiovascular;  Laterality: Right;  . AMPUTATION Left 07/23/2017   Procedure: AMPUTATION  BELOW KNEE;  Surgeon: Maeola Harmanain, Brandon Christopher, MD;  [facility];  Service: Vascular;  Laterality: Left;  . AMPUTATION Left 08/12/2017   Procedure: REVISION BELOW KNEE;  Surgeon: Maeola Harmanain, Brandon Christopher, MD;  [facility];  Service: Vascular;  Laterality: Left;  . AMPUTATION Left 08/27/2017   Procedure: left  ABOVE KNEE AMPUTATION;  Surgeon: Maeola Harmanain, Brandon Christopher, MD;  [facility];  Service: Vascular;  Laterality: Left;  . AMPUTATION Left 08/30/2017   Procedure: REVISION AMPUTATION ABOVE KNEE;  Surgeon: Nada LibmanBrabham, Vance W, MD;  [facility];  Service: Vascular;  Laterality: Left;  . AMPUTATION Right 11/04/2017   Procedure: AMPUTATION RIGHT FOURTH TOE;  Surgeon: Maeola Harmanain, Brandon Christopher, MD;  [facility];  Service: Vascular;  Laterality: Right;  . AMPUTATION Right 11/12/2018   Procedure: AMPUTATION OF second, third and fifth toes;  Surgeon: Nada LibmanBrabham, Vance W, MD;  Location: [facility];  Service: Vascular;  Laterality: Right;  . AMPUTATION Right 01/19/2019   Procedure: AMPUTATION BELOW KNEE;  Surgeon: Sherren KernsFields, Charles E, MD;  [facility];  Service: Vascular;  Laterality: Right;  . APPLICATION OF WOUND VAC Left 08/27/2017   Procedure: APPLICATION OF WOUND VAC;  Surgeon: Maeola Harmanain, Brandon Christopher, MD;  [facility];  Service: Vascular;  Laterality: Left;  . CARPAL TUNNEL RELEASE Right   . COLONOSCOPY    . LOWER EXTREMITY ANGIOGRAM Right 11/12/2018   Procedure: ANGIOGRAM OF Right LEG;  Surgeon: Nada LibmanBrabham, Vance W, MD;  [facility];  Service: Vascular;  Laterality: Right;  . LOWER EXTREMITY INTERVENTION Right 10/09/2017   Procedure: LOWER EXTREMITY INTERVENTION;  Surgeon: Maeola Harmanain, Brandon Christopher, MD;  [facility];  Service: Cardiovascular;  Laterality: Right;  . TOE AMPUTATION Right 11/12/2018   2ND 3RD &  5TH TOE   . TRANSMETATARSAL AMPUTATION Right 11/19/2018   Procedure: TRANSMETATARSAL AMPUTATION REVISION;  Surgeon: Nada Libman, MD;  [facility];  Service: Vascular;  Laterality: Right;    There were no vitals filed for this visit.   Subjective Assessment - 07/18/20 1258    Subjective She wore the prosthesis 4-5 hours at a time.  Prosthetist put longer pin in one liner for now.  She has been dreaming about having both legs and walking. No falls.    Patient Stated  Goals Walk with 2 prostheses in home mostly and maybe limited community.    Pain Score 7     Pain Location Hand    Pain Orientation Right;Left    Pain Descriptors / Indicators Aching;Numbness;Pins and needles;Sore    Pain Type Chronic pain;Neuropathic pain    Pain Onset More than a month ago    Pain Frequency Constant    Aggravating Factors  weightbearing    Pain Relieving Factors medication esp new one Dr. Hyman Hopes ordered, resting, heat/ice                             [facility] Adult PT Treatment/Exercise - 07/18/20 1300      Transfers   Transfers Sit to Stand;Stand to [employer]    Sit to Stand 3: Mod assist;With upper extremity assist;With armrests;From chair/3-in-1;Other (comment)   to //bars & to RW   Sit to Stand Details Tactile cues for weight shifting;Visual cues for safe use of DME/AE;Verbal cues for sequencing;Verbal cues for technique;Verbal cues for safe use of DME/AE;Manual facilitation for weight bearing;Manual facilitation for weight shifting    Stand to Sit 4: Min assist;With upper extremity assist;With armrests;To chair/3-in-1;Other (comment)   from //bars & from RW   Stand to Sit Details (indicate cue type and reason) Tactile cues for weight shifting;Visual cues for safe use of DME/AE;Verbal cues for technique;Verbal cues for safe use of DME/AE;Manual facilitation for weight shifting    Stand Pivot Transfers 2: Max assist   2 person, RW & Bil prostheses   Stand Pivot Transfer Details (indicate cue type and reason) constant verbal & manual cues on technique.  Only made 45* turn ea way and chair had to be moved to pt.     Lateral/Scoot Transfers 3: Mod assist;With armrests removed   w/c <> Nustep w/TTA prosthesis only, no sliding board.   Lateral/Scoot Transfer Details (indicate cue type and reason) manual & verbal cues on technique      Ambulation/Gait   Ambulation/Gait Yes    Ambulation/Gait Assistance 2: Max assist   MaxA 1 person in //bars &  MaxA 2 person RW   Ambulation/Gait Assistance Details verbal & manual cues on sequence, wt shift, posture & RLE knee extension in stance.     Ambulation Distance (Feet) 10 Feet   3' X 2 //bars & RW 4' straight path   Assistive device Parallel bars;Prostheses;Rolling walker    Gait Pattern Step-to pattern;Decreased step length - right;Decreased stance time - left;Decreased stride length;Antalgic;Lateral hip instability;Trunk flexed;Wide base of support;Abducted - left;Poor foot clearance - left;Poor foot clearance - right    Ambulation Surface Level;Indoor      Neuro Re-ed    Neuro Re-ed Details  --      Knee/Hip Exercises: Aerobic   Nustep BUEs & RLE (TTA) level 5 for 5 minutes      Prosthetics   Prosthetic Care Comments  continue wear as  long as tolerates then removing for 2 hours during awake hours.     Current prosthetic wear tolerance (days/week)  daily (TTA prosthesis & TFA liner only)    Current prosthetic wear tolerance (#hours/day)  3-5hrs 2-3x/day    Current prosthetic weight-bearing tolerance (hours/day)  No pain or discomfort with standing in //bars.     Edema pitting edema TTA limb    Residual limb condition  right TTA has medium red rash over distal limb but no signs of infection.  bulbous edematous shape.  no open areas.    TFA no issues noted.     Education Provided Skin check;Residual limb care;Correct ply sock adjustment;Proper Donning;Proper wear schedule/adjustment;Other (comment)   see prosthetic care comments   Person(s) Educated Patient;Caregiver(s)    Education Method Explanation;Verbal cues    Education Method Verbalized understanding;Verbal cues required;Needs further instruction                    PT Short Term Goals - 07/11/20 1716      PT SHORT TERM GOAL #1   Title Patient tolerates wearing TTA prosthesis >10 hrs total / day without increase in skin issues. (All STGs Target Date: 07/29/2020)    Time 1    Period Months    Status On-going     Target Date 07/29/20      PT SHORT TERM GOAL #2   Title Patient donnes transtibial prosthesis with supervision    Time 1    Period Months    Status On-going    Target Date 07/29/20      PT SHORT TERM GOAL #3   Title Sit to stand w/c to RW with moderate assist.    Time 1    Period Months    Status On-going    Target Date 07/29/20      PT SHORT TERM GOAL #4   Title Stand pivot transfer with RW & bilateral prostheses with moderate assist.    Time 1    Period Months    Status On-going    Target Date 07/29/20      PT SHORT TERM GOAL #5   Title Patient ambulates 10' with RW & bilateral prostheses with maxA.    Time 1    Period Months    Status On-going    Target Date 07/29/20             PT Long Term Goals - 06/29/20 1613      PT LONG TERM GOAL #1   Title Patient tolerates wear of Transtibial prosthesis and Transfemoral liner  >80% of awake hours and Transfemoral prosthesis >5hours total / day without skin or limb pain issues.  (All Target Date 11/25/2020)    Time 6    Period Months    Status On-going      PT LONG TERM GOAL #2   Title Patient verbalizes and demonstrates understading of proper prosthetic care including donning both prostheses.    Time 6    Period Months    Status On-going      PT LONG TERM GOAL #3   Title Patient's standing balance with RW & bilateral prostheses modified independent: sit to/from stand chairs with armrests, reaching 5" anteriorly & knee level and managing clothes.    Time 6    Period Months    Status On-going      PT LONG TERM GOAL #4   Title Patient ambulates 59' around furniture with RW & bilateral prostheses modified independent.  Time 6    Period Months    Status On-going                 Plan - 07/18/20 1257    Clinical Impression Statement Patient did not stand, walk or transfer as well today as last couple of weeks.  She reports that she has not slept much for last 24-48 hours.  The longer pin in TTA liner does  improve her ability to donne with flexible inner liner.    Personal Factors and Comorbidities Comorbidity 3+;Education;Fitness;Past/Current Experience;Social Background;Time since onset of injury/illness/exacerbation    Comorbidities Rt TTA, Lt TFA, DM, HTN, PVD, depression, right Carpal Tunnel Release surgery    Examination-Activity Limitations Locomotion Level;Squat;Stand;Transfers;Stairs    Examination-Participation Restrictions Community Activity    Stability/Clinical Decision Making Evolving/Moderate complexity    Rehab Potential Good    PT Frequency 2x / week    PT Duration Other (comment)   6 months (26 weeks)   PT Treatment/Interventions ADLs/Self Care Home Management;DME Instruction;Gait training;Stair training;Functional mobility training;Therapeutic activities;Therapeutic exercise;Balance training;Neuromuscular re-education;Patient/family education;Prosthetic Training;Manual techniques;Scar mobilization;Passive range of motion;Vasopneumatic Device;Vestibular    PT Next Visit Plan work towards to updated STGs,  standing & gait with RW    Consulted and Agree with Plan of Care Patient;Family member/caregiver    Family Member Consulted nephew / caregiver Marikay Alar           Patient will benefit from skilled therapeutic intervention in order to improve the following deficits and impairments:  Abnormal gait, Cardiopulmonary status limiting activity, Decreased activity tolerance, Decreased balance, Decreased endurance, Decreased knowledge of use of DME, Decreased mobility, Decreased range of motion, Decreased skin integrity, Decreased scar mobility, Decreased strength, Dizziness, Increased edema, Impaired flexibility, Postural dysfunction, Prosthetic Dependency, Obesity, Pain  Visit Diagnosis: Unsteadiness on feet  Muscle weakness (generalized)  Other abnormalities of gait and mobility  Abnormal posture  Pain in right hand  Stiffness of left hip, not elsewhere  classified     Problem List Patient Active Problem List   Diagnosis Date Noted  . Right below-knee amputee (HCC) 01/23/2019  . Gangrene of right foot (HCC) 01/17/2019  . History of transmetatarsal amputation of right foot (HCC) 12/13/2018  . At risk for adverse drug event 11/27/2018  . Osteomyelitis of third toe of right foot (HCC) 11/12/2018  . Pain in finger of right hand 02/20/2018  . Atherosclerosis of native arteries of the extremities with gangrene (HCC) 11/01/2017  . Unilateral AKA, left (HCC)   . Post-operative pain   . OSA (obstructive sleep apnea)   . Benign essential HTN   . DM (diabetes mellitus), secondary, uncontrolled, with peripheral vascular complications (HCC)   . Hyperkalemia   . Leukocytosis   . Acute blood loss anemia   . Amputation stump infection (HCC) 08/23/2017  . Amputation of left lower extremity above knee upon examination (HCC) 08/23/2017  . PAD (peripheral artery disease) (HCC) 07/23/2017  . Cellulitis 06/10/2017  . Hyponatremia 06/10/2017  . Fever 06/10/2017  . Rash 05/24/2017  . HTN (hypertension)   . Allergic rhinitis   . Insomnia   . Depression     Vladimir Faster PT, DPT 07/18/2020, 1:52 PM  [facility] [address] [place], [place], [address] Phone: [phone]   Fax:  [phone]  Name: STESHA NEYENS MRN: [id number] Date of Birth: 1956-06-09

## 2020-07-20 ENCOUNTER — Encounter: Payer: Self-pay | Admitting: Physical Therapy

## 2020-07-20 ENCOUNTER — Other Ambulatory Visit: Payer: Self-pay

## 2020-07-20 ENCOUNTER — Ambulatory Visit (INDEPENDENT_AMBULATORY_CARE_PROVIDER_SITE_OTHER): Payer: Medicare HMO | Admitting: Physical Therapy

## 2020-07-20 DIAGNOSIS — M6281 Muscle weakness (generalized): Secondary | ICD-10-CM | POA: Diagnosis not present

## 2020-07-20 DIAGNOSIS — R293 Abnormal posture: Secondary | ICD-10-CM

## 2020-07-20 DIAGNOSIS — M79641 Pain in right hand: Secondary | ICD-10-CM

## 2020-07-20 DIAGNOSIS — R2681 Unsteadiness on feet: Secondary | ICD-10-CM | POA: Diagnosis not present

## 2020-07-20 DIAGNOSIS — M25652 Stiffness of left hip, not elsewhere classified: Secondary | ICD-10-CM | POA: Diagnosis not present

## 2020-07-20 DIAGNOSIS — R2689 Other abnormalities of gait and mobility: Secondary | ICD-10-CM

## 2020-07-20 NOTE — Therapy (Signed)
[facility] [address] [place], [place], [address] Phone: [phone]   Fax:  [phone]  Physical Therapy Treatment  Patient Details  Name: Danielle Buchanan MRN: [id number] Date of Birth: 1955-10-30 Referring Provider (PT): [name], MD   Encounter Date: 07/20/2020   PT End of Session - 07/20/20 1641    Visit Number 16    Number of Visits 52    Date for PT Re-Evaluation 08/26/20    Authorization Type [employer]    Authorization Time Period $10 co-pay,    Progress Note Due on Visit 20    PT Start Time 1300    PT Stop Time 1345    PT Time Calculation (min) 45 min    Equipment Utilized During Treatment Gait belt    Activity Tolerance Patient tolerated treatment well    Behavior During Therapy Flat affect;WFL for tasks assessed/performed           Past Medical History:  Diagnosis Date   Allergic rhinitis    Depression    Diabetes (HCC)    Dyslipidemia    HTN (hypertension)    Insomnia    OSA (obstructive sleep apnea)    no cpap   Urinary incontinence     Past Surgical History:  Procedure Laterality Date   ABDOMINAL AORTOGRAM W/LOWER EXTREMITY N/A 06/24/2017   Procedure: ABDOMINAL AORTOGRAM W/LOWER EXTREMITY;  Surgeon: Maeola Harman, MD;  Location: [facility] INVASIVE CV LAB;  Service: Cardiovascular;  Laterality: N/A;   ABDOMINAL AORTOGRAM W/LOWER EXTREMITY Right 10/09/2017   Procedure: ABDOMINAL AORTOGRAM W/LOWER EXTREMITY;  Surgeon: Maeola Harman, MD;  Location: [facility] INVASIVE CV LAB;  Service: Cardiovascular;  Laterality: Right;   AMPUTATION Left 07/23/2017   Procedure: AMPUTATION  BELOW KNEE;  Surgeon: Maeola Harman, MD;  Location: [facility] OR;  Service: Vascular;  Laterality: Left;   AMPUTATION Left 08/12/2017   Procedure: REVISION BELOW KNEE;  Surgeon: Maeola Harman, MD;  Location: [facility] OR;  Service: Vascular;  Laterality: Left;   AMPUTATION Left 08/27/2017   Procedure: left  ABOVE KNEE AMPUTATION;  Surgeon: Maeola Harman, MD;  Location: [facility] OR;  Service: Vascular;  Laterality: Left;   AMPUTATION Left 08/30/2017   Procedure: REVISION AMPUTATION ABOVE KNEE;  Surgeon: Nada Libman, MD;  Location: [facility] OR;  Service: Vascular;  Laterality: Left;   AMPUTATION Right 11/04/2017   Procedure: AMPUTATION RIGHT FOURTH TOE;  Surgeon: Maeola Harman, MD;  Location: [facility] OR;  Service: Vascular;  Laterality: Right;   AMPUTATION Right 11/12/2018   Procedure: AMPUTATION OF second, third and fifth toes;  Surgeon: Nada Libman, MD;  Location: [facility] OR;  Service: Vascular;  Laterality: Right;   AMPUTATION Right 01/19/2019   Procedure: AMPUTATION BELOW KNEE;  Surgeon: Sherren Kerns, MD;  Location: [facility] OR;  Service: Vascular;  Laterality: Right;   APPLICATION OF WOUND VAC Left 08/27/2017   Procedure: APPLICATION OF WOUND VAC;  Surgeon: Maeola Harman, MD;  Location: [facility] OR;  Service: Vascular;  Laterality: Left;   CARPAL TUNNEL RELEASE Right    COLONOSCOPY     LOWER EXTREMITY ANGIOGRAM Right 11/12/2018   Procedure: Rosalin Hawking OF Right LEG;  Surgeon: Nada Libman, MD;  Location: [facility] OR;  Service: Vascular;  Laterality: Right;   LOWER EXTREMITY INTERVENTION Right 10/09/2017   Procedure: LOWER EXTREMITY INTERVENTION;  Surgeon: Maeola Harman, MD;  Location: [facility] INVASIVE CV LAB;  Service: Cardiovascular;  Laterality: Right;   TOE AMPUTATION Right 11/12/2018   2ND 3RD &  5TH TOE    TRANSMETATARSAL AMPUTATION Right 11/19/2018   Procedure: TRANSMETATARSAL AMPUTATION REVISION;  Surgeon: Nada LibmanBrabham, Vance W, MD;  [facility];  Service: Vascular;  Laterality: Right;    There were no vitals filed for this visit.   Subjective Assessment - 07/20/20 1300    Subjective She is having difficulty releasing longer pin and had to get neighbor to come over to get prosthesis off her limb.    Patient Stated Goals Walk with 2 prostheses in home mostly and  maybe limited community.    Currently in Pain? Yes    Pain Score 6     Pain Location Hand    Pain Orientation Right;Left    Pain Descriptors / Indicators Numbness;Pins and needles    Pain Type Neuropathic pain;Chronic pain    Pain Onset More than a month ago    Pain Frequency Constant    Aggravating Factors  weight bearing or use of hands    Pain Relieving Factors rest, heat, ice                             [facility] Adult PT Treatment/Exercise - 07/20/20 1300      Transfers   Transfers Sit to Stand;Stand to Dollar GeneralSit;Stand Pivot Transfers    Sit to Stand 3: Mod assist;With upper extremity assist;With armrests;From chair/3-in-1;Other (comment)   to //bars & to RW   Sit to Stand Details Tactile cues for weight shifting;Visual cues for safe use of DME/AE;Verbal cues for sequencing;Verbal cues for technique;Verbal cues for safe use of DME/AE;Manual facilitation for weight bearing;Manual facilitation for weight shifting    Stand to Sit 4: Min assist;With upper extremity assist;With armrests;To chair/3-in-1;Other (comment)   from //bars & from RW   Stand to Sit Details (indicate cue type and reason) Tactile cues for weight shifting;Visual cues for safe use of DME/AE;Verbal cues for technique;Verbal cues for safe use of DME/AE;Manual facilitation for weight shifting    Stand Pivot Transfers --    Lateral/Scoot Transfers --      Ambulation/Gait   Ambulation/Gait Yes    Ambulation/Gait Assistance 2: Max assist   MaxA 1 person in //bars   Ambulation/Gait Assistance Details excessive UE weight bearing on //bars, PT manually controlling left TFA movement and blocking / extending right knee in stance.  Prosthetist present & Tarry KosAshley Kubinski,  CPO plantarflexed prosthetic foot on TTA prosthesis which improved knee stabilization.      Ambulation Distance (Feet) 8 Feet   8' X 2   Assistive device Parallel bars;Prostheses    Gait Pattern Step-to pattern;Decreased step length - right;Decreased  stance time - left;Decreased stride length;Antalgic;Lateral hip instability;Trunk flexed;Wide base of support;Abducted - left;Poor foot clearance - left;Poor foot clearance - right    Ambulation Surface Level;Indoor      Neuro Re-ed    Neuro Re-ed Details  sitting with left ischium on 24" bar stool and right TTA prosthesis on floor: looking right /left & up down,  reaching overhead reciprocal UE & BUEs.  close supervision & no UE support.       Knee/Hip Exercises: Aerobic   Nustep --      Prosthetics   Prosthetic Care Comments  Doffing initially tried with cane put unable to disengage pin. switched to pressing button into chair leg but inconsistent.  Then positioned cane horizontal ~11" off floor at height of button and prosthetic heel on floor. She was able to consistently used cane tip to  press button, hold socket down medial / lateral then lift limb out of socket.  Her nephew had idea of place to attach a cane tip at 59' from floor that she could use to get prosthesis off.     continue wear as long as tolerates then removing for 2 hours during awake hours.     Current prosthetic wear tolerance (days/week)  daily (TTA prosthesis & TFA liner only)    Current prosthetic wear tolerance (#hours/day)  3-5hrs 2-3x/day    Current prosthetic weight-bearing tolerance (hours/day)  No pain or discomfort with standing in //bars.     Edema pitting edema TTA limb    Residual limb condition  right TTA has medium red rash over distal limb but no signs of infection.  bulbous edematous shape.  no open areas.    TFA no issues noted.     Education Provided Skin check;Residual limb care;Correct ply sock adjustment;Proper Donning;Proper wear schedule/adjustment;Other (comment);Proper Doffing   see prosthetic care comments   Person(s) Educated Patient;Caregiver(s)    Education Method Explanation;Demonstration;Tactile cues;Verbal cues    Education Method Verbalized understanding;Returned demonstration;Tactile cues  required;Verbal cues required;Needs further instruction                    PT Short Term Goals - 07/11/20 1716      PT SHORT TERM GOAL #1   Title Patient tolerates wearing TTA prosthesis >10 hrs total / day without increase in skin issues. (All STGs Target Date: 07/29/2020)    Time 1    Period Months    Status On-going    Target Date 07/29/20      PT SHORT TERM GOAL #2   Title Patient donnes transtibial prosthesis with supervision    Time 1    Period Months    Status On-going    Target Date 07/29/20      PT SHORT TERM GOAL #3   Title Sit to stand w/c to RW with moderate assist.    Time 1    Period Months    Status On-going    Target Date 07/29/20      PT SHORT TERM GOAL #4   Title Stand pivot transfer with RW & bilateral prostheses with moderate assist.    Time 1    Period Months    Status On-going    Target Date 07/29/20      PT SHORT TERM GOAL #5   Title Patient ambulates 10' with RW & bilateral prostheses with maxA.    Time 1    Period Months    Status On-going    Target Date 07/29/20             PT Long Term Goals - 06/29/20 1613      PT LONG TERM GOAL #1   Title Patient tolerates wear of Transtibial prosthesis and Transfemoral liner  >80% of awake hours and Transfemoral prosthesis >5hours total / day without skin or limb pain issues.  (All Target Date 11/25/2020)    Time 6    Period Months    Status On-going      PT LONG TERM GOAL #2   Title Patient verbalizes and demonstrates understading of proper prosthetic care including donning both prostheses.    Time 6    Period Months    Status On-going      PT LONG TERM GOAL #3   Title Patient's standing balance with RW & bilateral prostheses modified independent: sit to/from stand chairs with armrests, reaching  5" anteriorly & knee level and managing clothes.    Time 6    Period Months    Status On-going      PT LONG TERM GOAL #4   Title Patient ambulates 30' around furniture with RW &  bilateral prostheses modified independent.    Time 6    Period Months    Status On-going                 Plan - 07/20/20 1642    Clinical Impression Statement Patient was able to doffe prosthesis with mounted tip at 11" from floor.  Patient forgot TFA liner today but prosthetist had her old one with cracks at umbrella of liner causing limb discomfort with gait.    Personal Factors and Comorbidities Comorbidity 3+;Education;Fitness;Past/Current Experience;Social Background;Time since onset of injury/illness/exacerbation    Comorbidities Rt TTA, Lt TFA, DM, HTN, PVD, depression, right Carpal Tunnel Release surgery    Examination-Activity Limitations Locomotion Level;Squat;Stand;Transfers;Stairs    Examination-Participation Restrictions Community Activity    Stability/Clinical Decision Making Evolving/Moderate complexity    Rehab Potential Good    PT Frequency 2x / week    PT Duration Other (comment)   6 months (26 weeks)   PT Treatment/Interventions ADLs/Self Care Home Management;DME Instruction;Gait training;Stair training;Functional mobility training;Therapeutic activities;Therapeutic exercise;Balance training;Neuromuscular re-education;Patient/family education;Prosthetic Training;Manual techniques;Scar mobilization;Passive range of motion;Vasopneumatic Device;Vestibular    PT Next Visit Plan check updated STGs,  standing & gait with RW    Consulted and Agree with Plan of Care Patient;Family member/caregiver    Family Member Consulted nephew / caregiver Marikay Alar           Patient will benefit from skilled therapeutic intervention in order to improve the following deficits and impairments:  Abnormal gait, Cardiopulmonary status limiting activity, Decreased activity tolerance, Decreased balance, Decreased endurance, Decreased knowledge of use of DME, Decreased mobility, Decreased range of motion, Decreased skin integrity, Decreased scar mobility, Decreased strength, Dizziness,  Increased edema, Impaired flexibility, Postural dysfunction, Prosthetic Dependency, Obesity, Pain  Visit Diagnosis: Unsteadiness on feet  Muscle weakness (generalized)  Other abnormalities of gait and mobility  Abnormal posture  Pain in right hand  Stiffness of left hip, not elsewhere classified     Problem List Patient Active Problem List   Diagnosis Date Noted   Right below-knee amputee (HCC) 01/23/2019   Gangrene of right foot (HCC) 01/17/2019   History of transmetatarsal amputation of right foot (HCC) 12/13/2018   At risk for adverse drug event 11/27/2018   Osteomyelitis of third toe of right foot (HCC) 11/12/2018   Pain in finger of right hand 02/20/2018   Atherosclerosis of native arteries of the extremities with gangrene (HCC) 11/01/2017   Unilateral AKA, left (HCC)    Post-operative pain    OSA (obstructive sleep apnea)    Benign essential HTN    DM (diabetes mellitus), secondary, uncontrolled, with peripheral vascular complications (HCC)    Hyperkalemia    Leukocytosis    Acute blood loss anemia    Amputation stump infection (HCC) 08/23/2017   Amputation of left lower extremity above knee upon examination (HCC) 08/23/2017   PAD (peripheral artery disease) (HCC) 07/23/2017   Cellulitis 06/10/2017   Hyponatremia 06/10/2017   Fever 06/10/2017   Rash 05/24/2017   HTN (hypertension)    Allergic rhinitis    Insomnia    Depression     Vladimir Faster PT, DPT 07/20/2020, 4:45 PM  [facility] [address] [place], [place], [address] Phone: [phone]  Fax:  [phone]  Name: ALLANI REBER MRN: [id number] Date of Birth: 1956/08/25

## 2020-07-21 ENCOUNTER — Ambulatory Visit: Payer: Medicare HMO | Admitting: Psychology

## 2020-07-21 ENCOUNTER — Encounter: Payer: Self-pay | Admitting: Psychology

## 2020-07-21 ENCOUNTER — Ambulatory Visit (INDEPENDENT_AMBULATORY_CARE_PROVIDER_SITE_OTHER): Payer: Medicare HMO | Admitting: Psychology

## 2020-07-21 DIAGNOSIS — E1351 Other specified diabetes mellitus with diabetic peripheral angiopathy without gangrene: Secondary | ICD-10-CM

## 2020-07-21 DIAGNOSIS — G4733 Obstructive sleep apnea (adult) (pediatric): Secondary | ICD-10-CM

## 2020-07-21 DIAGNOSIS — F4310 Post-traumatic stress disorder, unspecified: Secondary | ICD-10-CM | POA: Diagnosis not present

## 2020-07-21 DIAGNOSIS — G3184 Mild cognitive impairment, so stated: Secondary | ICD-10-CM

## 2020-07-21 DIAGNOSIS — E1365 Other specified diabetes mellitus with hyperglycemia: Secondary | ICD-10-CM | POA: Diagnosis not present

## 2020-07-21 DIAGNOSIS — R4189 Other symptoms and signs involving cognitive functions and awareness: Secondary | ICD-10-CM

## 2020-07-21 DIAGNOSIS — IMO0002 Reserved for concepts with insufficient information to code with codable children: Secondary | ICD-10-CM

## 2020-07-21 NOTE — Progress Notes (Signed)
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    This information was confirmed by Ms. Bartell.  Academic/Vocational History: Highest level of educational attainment: 3rd grade. Ms. Shannahan attended school regularly through the third grade. However, after that, she stopped attending. She is able to read and write.   History of developmental delay: Denied. History of grade repetition: Denied. Enrollment in special education courses: Denied. History of LD/ADHD: Denied.  Employment: Retired. She previously worked as a Arboriculturist for 30 years.   Evaluation Results:   Behavioral Observations: Ms. Koppenhaver was unaccompanied, arrived to her appointment on time, and was appropriately dressed and groomed. She appeared alert and oriented. Observed gait and station could not be assessed as she ambulated via a wheelchair pushed by her nephew. Gross motor functioning appeared intact upon informal observation and no abnormal movements (e.g., tremors) were noted. Her affect was generally relaxed and positive, but did range appropriately given the subject being discussed during the clinical interview or the task at hand during testing procedures. Spontaneous speech was fluent and word finding difficulties were not observed during the clinical interview. Thought  processes were coherent, organized, and normal in content. Insight into her cognitive difficulties appeared limited in that objective testing revealed more significant deficits than what were reported during interview. She also denied several medical conditions which were present in her records and it was unclear if she was truly unaware of this or if she was actively downplaying her cognitive deficits and medical history. During testing, sustained attention was appropriate. Task engagement was adequate and she persisted when challenged. She did appear to fatigue as the evaluation progressed. She also made several self-critical comments surrounding her intelligence. Overall, Ms. Hermida was cooperative with the clinical interview and subsequent testing procedures.   Adequacy of Effort: The validity of neuropsychological testing is limited by the extent to which the individual being tested may be assumed to have exerted adequate effort during testing. Ms. Pai expressed her intention to perform to the best of her abilities and exhibited adequate task engagement and persistence. Scores across stand-alone and embedded performance validity measures were variable but generally below expectation. While this likely reflects true cognitive dysfunction, interpretation should be completed with a mild degree of caution.  Test Results: Ms. Salaz was poorly oriented at the time of the current evaluation. She was unable to name the current city which she lives in, as well as her full phone number. She also incorrectly stated the current year ("1921"), date, and location.   Intellectual abilities based upon educational and vocational attainment were estimated to be in the well below average range. Premorbid abilities were estimated to be  within the exceptionally low range based upon a single-word reading test.   Processing speed was exceptionally low. Basic attention was average. More complex attention (e.g., working  memory) was below average. Assessed executive functioning was exceptionally low.  Assessed receptive language abilities were average. Likewise, Ms. Oriordan did not exhibit any difficulties comprehending task instructions and answered all questions asked of her appropriately. Assessed expressive language was exceptionally low across verbal fluency measures but below average across confrontation naming.      Assessed visuospatial/visuoconstructional abilities were variable, ranging from the exceptionally low to below average normative ranges. Points were lost on her drawing of a complex figure due to an overall quick and sloppy approach. She did not exhibit notable visual distortions or spatial abnormalities.    Learning (i.e., encoding) of novel verbal and visual information was exceptionally low. Spontaneous delayed recall (i.e., retrieval) of previously learned information was exceptionally low to below average. Retention rates were 133% across a story learning task, 20-60% across a list learning task, and 45% across a complex figure drawing task. Performance across recognition tasks was exceptionally low to below average, suggesting limited evidence for information consolidation.   Results of emotional screening instruments suggested that recent symptoms of generalized anxiety were in the moderate range, while symptoms of depression were within normal limits. She did elevate a trauma symptom questionnaire suggesting current symptoms consistent with an active PTSD diagnosis. A screening instrument assessing recent sleep quality suggested the presence of moderate sleep dysfunction.  Tables of Scores:   Note: This summary of test scores accompanies the interpretive report and should not be considered in isolation without reference to the appropriate sections in the text. Descriptors are based on appropriate normative data and may be adjusted based on clinical judgment. The terms "impaired" and "within  normal limits (WNL)" are used when a more specific level of functioning cannot be determined. Descriptors refer to the current evaluation only.         [facility]:    [facility]    ACS Word Choice: --- --- --- Below Expectation  Dot Counting Test: --- --- --- Below Expectation  WAIS-IV Reliable Digit Span: --- --- --- Within Expectation  CVLT-III Forced Choice Recognition: --- --- --- Below Expectation        Orientation:       Raw Score Raw Score Percentile   NAB Orientation, Form 1 --- 21/29 --- ---        Cognitive Screening:             Raw Score Raw Score Percentile   SLUMS: --- 19/30 --- ---        Intellectual Functioning:             Standard Score Standard Score Percentile   Test of Premorbid Functioning: 69 68 2 Exceptionally Low        Wechsler Adult Intelligence Scale (WAIS-IV): Standard Score Standard Score Percentile     Full Scale IQ*  64 --- --- ---  *8-subtest            Memory:            Wechsler Memory Scale (WMS-IV):                       Scaled Score Raw Score (Scaled Score) Percentile     Logical Memory I 5 9/50 (2) <1 Exceptionally Low    Logical Memory II 7 12/50 (6) 9 Below Average  Logical Memory Recognition 3-9% 17/30 <2 Exceptionally Low        [place] Verbal Learning Test (CVLT-III) Brief Form: Z-Score Raw Score (Scaled/Standard Score) Percentile     Total Trials 1-4 34 (T) 15/36 (60) <1 Exceptionally Low    Short-Delay Free Recall -1 3/9 (1) <1 Exceptionally Low    Long-Delay Free Recall -1.5 1/9 (2) <1 Exceptionally Low    Long-Delay Cued Recall -3.5 3/9 (2) <1 Exceptionally Low      Recognition Hits 8/9 7/9 (6) 9 Below Average      False Positive Errors 1 4 (3) 1 Exceptionally Low         Z-Score Raw Score (Scaled Score) Percentile   RBANS Figure Copy: -5.4 11/20 (2) <1 Exceptionally Low  RBANS Figure Recall: -2.4 5/20 (4) 2 Well Below Average        Attention/Executive Function:            Trail Making  Test (TMT): T Score Raw Score (T Score) Percentile     Part A 15 91 secs.,  1 error (29) 2 Exceptionally Low    Part B Discontinued DC'D @ 300,  3 errors --- Impaired          Scaled Score Scaled Score Percentile   WAIS-IV Coding: 3 1 <1 Exceptionally Low         Scaled Score Scaled Score Percentile   WAIS-IV Digit Span: 5 6 9  Below Average    Forward --- 9 37 Average    Backward --- 6 9 Below Average    Sequencing --- 6 9 Below Average         Scaled Score Scaled Score Percentile   WAIS-IV Similarities: 5 4 2  Well Below Average        D-KEFS Color-Word Interference Test: Raw Score (Scaled Score) Raw Score (Scaled Score) Percentile     Color Naming --- 81 secs. (1) <1 Exceptionally Low    Word Reading --- 84 secs. (1) <1 Exceptionally Low    Inhibition --- Discontinued --- Impaired    Inhibition/Switching --- Discontinued --- Impaired        D-KEFS Verbal Fluency Test: Raw Score (Scaled Score) Raw Score (Scaled Score) Percentile     Letter Total Correct --- 9 (2) <1 Exceptionally Low    Category Total Correct --- 16 (2) <1 Exceptionally Low    Category Switching Total Correct --- 1 (1) <1 Exceptionally Low    Category Switching Accuracy --- 0 (1) <1 Exceptionally Low      Total Set Loss Errors --- 0 (13) 84 Above Average      Total Repetition Errors --- 3 (10) 50 Average        Language:            Verbal Fluency Test: T Score Raw Score (T Score) Percentile     Phonemic Fluency (FAS) 34 9 (21) <1 Exceptionally Low    Animal Fluency 37 6 (21) <1 Exceptionally Low         NAB Language Module, Form 1: T Score T Score Percentile     Auditory Comprehension 40 45 31 Average    Naming 32 28/31 (40) 16 Below Average        Visuospatial/Visuoconstruction:       Raw Score Raw Score Percentile   Clock Drawing: WNL 10/10 --- Within Normal Limits        NAB Spatial Module, Form 1: T Score T Score Percentile     Visual Discrimination ---  26 1 Exceptionally Low         Scaled  Score Scaled Score Percentile   WAIS-IV Block Design: 6 6 9  Below Average        Mood and Personality:       Raw Score Raw Score Percentile   Beck Depression Inventory - II: 35 13 --- Within Normal Limits  PROMIS Anxiety Questionnaire: --- 20 --- Moderate  PTSD Checklist for DSM-5: 41 37 --- Above Threshold        Additional Questionnaires:       Raw Score Raw Score Percentile   PROMIS Sleep Disturbance Questionnaire: --- 31 --- Moderate   Informed Consent and Coding/Compliance:   Ms. Sarratt was provided with a verbal description of the nature and purpose of the present neuropsychological evaluation. Also reviewed were the foreseeable risks and/or discomforts and benefits of the procedure, limits of confidentiality, and mandatory reporting requirements of this provider. The patient was given the opportunity to ask questions and receive answers about the evaluation. Oral consent to participate was provided by the patient.   This evaluation was conducted by Anthony Sar, Ph.D., licensed clinical neuropsychologist. Ms. Symons completed a comprehensive clinical interview with Dr. Anthony Sar, billed as one unit [phone], and 145 minutes of cognitive testing and scoring, billed as one unit [phone] and four additional units [id number]. Psychometrist [id number], B.S., assisted Dr. Wallace Keller with test administration and scoring procedures. As a separate and discrete service, Dr. Milbert Coulter spent a total of 160 minutes in interpretation and report writing billed as one unit [phone] and two units [id number].

## 2020-07-21 NOTE — Progress Notes (Signed)
   Psychometrician Note   Cognitive testing was administered to Danielle Buchanan by Wallace Keller, B.S. (psychometrist) under the supervision of Dr. Newman Nickels, Ph.D., licensed psychologist on 07/21/20. Danielle Buchanan did not appear overtly distressed by the testing session per behavioral observation or responses across self-report questionnaires. Dr. Newman Nickels, Ph.D. checked in with Danielle Buchanan as needed to manage any distress related to testing procedures (if applicable). Rest breaks were offered.    The battery of tests administered was selected by Dr. Newman Nickels, Ph.D. with consideration to Danielle Buchanan's current level of functioning, the nature of her symptoms, emotional and behavioral responses during interview, level of literacy, observed level of motivation/effort, and the nature of the referral question. This battery was communicated to the psychometrist. Communication between Dr. Newman Nickels, Ph.D. and the psychometrist was ongoing throughout the evaluation and Dr. Newman Nickels, Ph.D. was immediately accessible at all times. Dr. Newman Nickels, Ph.D. provided supervision to the psychometrist on the date of this service to the extent necessary to assure the quality of all services provided.    Danielle Buchanan will return within approximately 1-2 weeks for an interactive feedback session with Dr. Milbert Coulter at which time her test performances, clinical impressions, and treatment recommendations will be reviewed in detail. Danielle Buchanan understands she can contact our office should she require our assistance before this time.  A total of 145 minutes of billable time were spent face-to-face with Danielle Buchanan by the psychometrist. This includes both test administration and scoring time. Billing for these services is reflected in the clinical report generated by Dr. Newman Nickels, Ph.D..  This note reflects time spent with the psychometrician and does not include test scores or any clinical  interpretations made by Dr. Milbert Coulter. The full report will follow in a separate note.

## 2020-07-22 ENCOUNTER — Encounter: Payer: Self-pay | Admitting: Psychology

## 2020-07-22 DIAGNOSIS — F4310 Post-traumatic stress disorder, unspecified: Secondary | ICD-10-CM | POA: Insufficient documentation

## 2020-07-25 ENCOUNTER — Ambulatory Visit (INDEPENDENT_AMBULATORY_CARE_PROVIDER_SITE_OTHER): Payer: Medicare HMO | Admitting: Physical Therapy

## 2020-07-25 ENCOUNTER — Other Ambulatory Visit: Payer: Self-pay

## 2020-07-25 ENCOUNTER — Encounter: Payer: Self-pay | Admitting: Physical Therapy

## 2020-07-25 DIAGNOSIS — R2681 Unsteadiness on feet: Secondary | ICD-10-CM

## 2020-07-25 DIAGNOSIS — M6281 Muscle weakness (generalized): Secondary | ICD-10-CM

## 2020-07-25 DIAGNOSIS — R2689 Other abnormalities of gait and mobility: Secondary | ICD-10-CM

## 2020-07-25 DIAGNOSIS — M79641 Pain in right hand: Secondary | ICD-10-CM | POA: Diagnosis not present

## 2020-07-25 DIAGNOSIS — M25652 Stiffness of left hip, not elsewhere classified: Secondary | ICD-10-CM | POA: Diagnosis not present

## 2020-07-25 DIAGNOSIS — R293 Abnormal posture: Secondary | ICD-10-CM

## 2020-07-25 NOTE — Therapy (Signed)
[facility] [address] [place], [place], [address] Phone: [phone]   Fax:  [phone]  Physical Therapy Treatment  Patient Details  Name: Danielle Buchanan MRN: [id number] Date of Birth: [birth year] Referring Provider (PT): [name], MD   Encounter Date: 07/25/2020   PT End of Session - 07/25/20 1308    Visit Number 17    Number of Visits 52    Date for PT Re-Evaluation 08/26/20    Authorization Type [employer]    Authorization Time Period $10 co-pay,    Progress Note Due on Visit 20    PT Start Time 1308    PT Stop Time 1348    PT Time Calculation (min) 40 min    Equipment Utilized During Treatment Gait belt    Activity Tolerance Patient tolerated treatment well    Behavior During Therapy Flat affect;WFL for tasks assessed/performed           Past Medical History:  Diagnosis Date   Allergic rhinitis    Amputation of left lower extremity above knee 08/23/2017   Amputation stump infection 08/23/2017   Atherosclerosis of native arteries of the extremities with gangrene 11/01/2017   2/4-2/15/2020 infected/necrotic third right toe 2/5 transmetatarsal amputation of toes 2-5 with removal of the big toe 2/12 by Dr Myra Gianotti All margins clean; Bactrim prescribed for Serratia osteomyelitis   DM (diabetes mellitus), secondary, uncontrolled, with peripheral vascular complications    Current 11/12/2018 A1c 10.9%   Dyslipidemia    Essential hypertension, benign    Gangrene of right foot 01/17/2019   Hyperkalemia    Hyponatremia 06/10/2017   Insomnia    Leukocytosis    Mixed stress and urge urinary incontinence 06/11/2019   OSA (obstructive sleep apnea)    no cpap   Osteomyelitis of third toe of right foot 11/12/2018   PAD (peripheral artery disease) 07/23/2017   Pain in finger of right hand 02/20/2018   PTSD (post-traumatic stress disorder)    Right below-knee amputee 01/23/2019   Ulnar neuropathy of left upper extremity  10/23/2018    Past Surgical History:  Procedure Laterality Date   ABDOMINAL AORTOGRAM W/LOWER EXTREMITY N/A 06/24/2017   Procedure: ABDOMINAL AORTOGRAM W/LOWER EXTREMITY;  Surgeon: Maeola Harman, MD;  Location: [facility] INVASIVE CV LAB;  Service: Cardiovascular;  Laterality: N/A;   ABDOMINAL AORTOGRAM W/LOWER EXTREMITY Right 10/09/2017   Procedure: ABDOMINAL AORTOGRAM W/LOWER EXTREMITY;  Surgeon: Maeola Harman, MD;  Location: [facility] INVASIVE CV LAB;  Service: Cardiovascular;  Laterality: Right;   AMPUTATION Left 07/23/2017   Procedure: AMPUTATION  BELOW KNEE;  Surgeon: Maeola Harman, MD;  Location: [facility] OR;  Service: Vascular;  Laterality: Left;   AMPUTATION Left 08/12/2017   Procedure: REVISION BELOW KNEE;  Surgeon: Maeola Harman, MD;  Location: [facility] OR;  Service: Vascular;  Laterality: Left;   AMPUTATION Left 08/27/2017   Procedure: left ABOVE KNEE AMPUTATION;  Surgeon: Maeola Harman, MD;  Location: [facility] OR;  Service: Vascular;  Laterality: Left;   AMPUTATION Left 08/30/2017   Procedure: REVISION AMPUTATION ABOVE KNEE;  Surgeon: Nada Libman, MD;  Location: [facility] OR;  Service: Vascular;  Laterality: Left;   AMPUTATION Right 11/04/2017   Procedure: AMPUTATION RIGHT FOURTH TOE;  Surgeon: Maeola Harman, MD;  Location: [facility] OR;  Service: Vascular;  Laterality: Right;   AMPUTATION Right 11/12/2018   Procedure: AMPUTATION OF second, third and fifth toes;  Surgeon: Nada Libman, MD;  Location: [facility] OR;  Service: Vascular;  Laterality: Right;  AMPUTATION Right 01/19/2019   Procedure: AMPUTATION BELOW KNEE;  Surgeon: Sherren Kerns, MD;  Location: [facility] OR;  Service: Vascular;  Laterality: Right;   APPLICATION OF WOUND VAC Left 08/27/2017   Procedure: APPLICATION OF WOUND VAC;  Surgeon: Maeola Harman, MD;  Location: [facility] OR;  Service: Vascular;  Laterality: Left;   CARPAL TUNNEL RELEASE Right    COLONOSCOPY     LOWER  EXTREMITY ANGIOGRAM Right 11/12/2018   Procedure: Rosalin Hawking OF Right LEG;  Surgeon: Nada Libman, MD;  Location: [facility] OR;  Service: Vascular;  Laterality: Right;   LOWER EXTREMITY INTERVENTION Right 10/09/2017   Procedure: LOWER EXTREMITY INTERVENTION;  Surgeon: Maeola Harman, MD;  Location: [facility] INVASIVE CV LAB;  Service: Cardiovascular;  Laterality: Right;   TOE AMPUTATION Right 11/12/2018   2ND 3RD & 5TH TOE    TRANSMETATARSAL AMPUTATION Right 11/19/2018   Procedure: TRANSMETATARSAL AMPUTATION REVISION;  Surgeon: Nada Libman, MD;  Location: [facility] OR;  Service: Vascular;  Laterality: Right;    There were no vitals filed for this visit.   Subjective Assessment - 07/25/20 1308    Subjective She has been able to donne BKA prosthesis with flexible liner & longer pins. She is still struggling with getting it off and had to call a neighbor for help.  She is wearing TTA prosthesis most of awake hours.    Patient Stated Goals Walk with 2 prostheses in home mostly and maybe limited community.    Currently in Pain? Yes    Pain Location Hand    Pain Orientation Right;Left    Pain Descriptors / Indicators Pins and needles    Pain Type Chronic pain;Neuropathic pain    Pain Onset More than a month ago    Pain Frequency Constant    Aggravating Factors  using hands    Pain Relieving Factors medication, heat & ice                             [facility] Adult PT Treatment/Exercise - 07/25/20 1308      Transfers   Transfers Sit to Stand;Stand to [employer] Transfers    Sit to Stand 3: Mod assist;With upper extremity assist;With armrests;From chair/3-in-1;Other (comment)   to //bars & to RW   Sit to Stand Details Tactile cues for weight shifting;Visual cues for safe use of DME/AE;Verbal cues for sequencing;Verbal cues for technique;Verbal cues for safe use of DME/AE;Manual facilitation for weight bearing;Manual facilitation for weight shifting    Stand to Sit 4: Min  assist;With upper extremity assist;With armrests;To chair/3-in-1;Other (comment)   from //bars & from RW   Stand to Sit Details (indicate cue type and reason) Tactile cues for weight shifting;Visual cues for safe use of DME/AE;Verbal cues for technique;Verbal cues for safe use of DME/AE;Manual facilitation for weight shifting      Ambulation/Gait   Ambulation/Gait Yes    Ambulation/Gait Assistance 2: Max assist   MaxA 1 person in //bars & MaxA 2 persons RW   Ambulation/Gait Assistance Details excessive UE weight bearing on //bars, PT manually controlling left TFA movement and blocking / extending right knee in stance    Ambulation Distance (Feet) 8 Feet   //bars 8' and RW 8' straight, 3' x 2 w/90* turn to sit   Assistive device Parallel bars;Prostheses    Gait Pattern Step-to pattern;Decreased step length - right;Decreased stance time - left;Decreased stride length;Antalgic;Lateral hip instability;Trunk flexed;Wide base of support;Abducted - left;Poor foot  clearance - left;Poor foot clearance - right    Ambulation Surface Level;Indoor      Neuro Re-ed    Neuro Re-ed Details  --      Exercises   Exercises Knee/Hip      Knee/Hip Exercises: Stretches   Passive Hamstring Stretch Right;2 reps;20 seconds    Passive Hamstring Stretch Limitations RLE extended on plate of leg press with strap to dorsiflex foot      Knee/Hip Exercises: Machines for Strengthening   Cybex Leg Press shuttle leg press RLE 50# 1st set 15 reps, 2nd set 7 reps      Prosthetics   Prosthetic Care Comments  Using tape to hold release button on TTA prosthesis. PT recommended working with prosthesis off limb initially then progress to using to doffe prosthesis     Current prosthetic wear tolerance (days/week)  daily (TTA prosthesis & TFA liner only)    Current prosthetic wear tolerance (#hours/day)  3-5hrs 2-3x/day    Current prosthetic weight-bearing tolerance (hours/day)  No pain or discomfort with standing in //bars.      Edema pitting edema TTA limb    Residual limb condition  right TTA has medium red rash over distal limb but no signs of infection.  bulbous edematous shape.  no open areas.    TFA no issues noted.     Education Provided Skin check;Residual limb care;Correct ply sock adjustment;Proper Donning;Proper wear schedule/adjustment;Other (comment);Proper Doffing   see prosthetic care comments   Person(s) Educated Patient;Caregiver(s)    Education Method Explanation;Demonstration;Tactile cues;Verbal cues    Education Method Verbalized understanding;Returned demonstration;Tactile cues required;Verbal cues required;Needs further instruction    Donning Prosthesis Supervision    Doffing Prosthesis Minimal assist                    PT Short Term Goals - 07/11/20 1716      PT SHORT TERM GOAL #1   Title Patient tolerates wearing TTA prosthesis >10 hrs total / day without increase in skin issues. (All STGs Target Date: 07/29/2020)    Time 1    Period Months    Status On-going    Target Date 07/29/20      PT SHORT TERM GOAL #2   Title Patient donnes transtibial prosthesis with supervision    Time 1    Period Months    Status On-going    Target Date 07/29/20      PT SHORT TERM GOAL #3   Title Sit to stand w/c to RW with moderate assist.    Time 1    Period Months    Status On-going    Target Date 07/29/20      PT SHORT TERM GOAL #4   Title Stand pivot transfer with RW & bilateral prostheses with moderate assist.    Time 1    Period Months    Status On-going    Target Date 07/29/20      PT SHORT TERM GOAL #5   Title Patient ambulates 10' with RW & bilateral prostheses with maxA.    Time 1    Period Months    Status On-going    Target Date 07/29/20             PT Long Term Goals - 06/29/20 1613      PT LONG TERM GOAL #1   Title Patient tolerates wear of Transtibial prosthesis and Transfemoral liner  >80% of awake hours and Transfemoral prosthesis >5hours total / day  without skin  or limb pain issues.  (All Target Date 11/25/2020)    Time 6    Period Months    Status On-going      PT LONG TERM GOAL #2   Title Patient verbalizes and demonstrates understading of proper prosthetic care including donning both prostheses.    Time 6    Period Months    Status On-going      PT LONG TERM GOAL #3   Title Patient's standing balance with RW & bilateral prostheses modified independent: sit to/from stand chairs with armrests, reaching 5" anteriorly & knee level and managing clothes.    Time 6    Period Months    Status On-going      PT LONG TERM GOAL #4   Title Patient ambulates 52' around furniture with RW & bilateral prostheses modified independent.    Time 6    Period Months    Status On-going                 Plan - 07/25/20 1311    Clinical Impression Statement PT instructed in using tape to hold release button and appears will enable her to doffe without assistance.  PT used leg press to program for strengthening.    Personal Factors and Comorbidities Comorbidity 3+;Education;Fitness;Past/Current Experience;Social Background;Time since onset of injury/illness/exacerbation    Comorbidities Rt TTA, Lt TFA, DM, HTN, PVD, depression, right Carpal Tunnel Release surgery    Examination-Activity Limitations Locomotion Level;Squat;Stand;Transfers;Stairs    Examination-Participation Restrictions Community Activity    Stability/Clinical Decision Making Evolving/Moderate complexity    Rehab Potential Good    PT Frequency 2x / week    PT Duration Other (comment)   6 months (26 weeks)   PT Treatment/Interventions ADLs/Self Care Home Management;DME Instruction;Gait training;Stair training;Functional mobility training;Therapeutic activities;Therapeutic exercise;Balance training;Neuromuscular re-education;Patient/family education;Prosthetic Training;Manual techniques;Scar mobilization;Passive range of motion;Vasopneumatic Device;Vestibular    PT Next Visit Plan  check updated STGs,  standing & gait with RW    Consulted and Agree with Plan of Care Patient;Family member/caregiver    Family Member Consulted nephew / caregiver Marikay Alar           Patient will benefit from skilled therapeutic intervention in order to improve the following deficits and impairments:  Abnormal gait, Cardiopulmonary status limiting activity, Decreased activity tolerance, Decreased balance, Decreased endurance, Decreased knowledge of use of DME, Decreased mobility, Decreased range of motion, Decreased skin integrity, Decreased scar mobility, Decreased strength, Dizziness, Increased edema, Impaired flexibility, Postural dysfunction, Prosthetic Dependency, Obesity, Pain  Visit Diagnosis: Unsteadiness on feet  Muscle weakness (generalized)  Other abnormalities of gait and mobility  Abnormal posture  Pain in right hand  Stiffness of left hip, not elsewhere classified     Problem List Patient Active Problem List   Diagnosis Date Noted   PTSD (post-traumatic stress disorder)    Mixed stress and urge urinary incontinence 06/11/2019   Right below-knee amputee 01/23/2019   Ulnar neuropathy of left upper extremity 10/23/2018   Pain in finger of right hand 02/20/2018   Atherosclerosis of native arteries of the extremities with gangrene 11/01/2017   OSA (obstructive sleep apnea)    Benign essential HTN    DM (diabetes mellitus), secondary, uncontrolled, with peripheral vascular complications    Hyperkalemia    Leukocytosis    Acute blood loss anemia    Amputation of left lower extremity above knee 08/23/2017   PAD (peripheral artery disease) 07/23/2017   Cellulitis 06/10/2017   Hyponatremia 06/10/2017   Fever 06/10/2017   Rash  05/24/2017   Allergic rhinitis    Insomnia     Vladimir Fasterobin Sianna Garofano, PT, DPT 07/25/2020, 4:51 PM  [facility] [address] [place], [place], [id number] Phone: [phone]   Fax:   [phone]  Name: Danielle Buchanan MRN: [id number] Date of Birth: [birth year]

## 2020-07-27 ENCOUNTER — Encounter: Payer: Medicare HMO | Admitting: Physical Therapy

## 2020-07-28 ENCOUNTER — Other Ambulatory Visit: Payer: Self-pay

## 2020-07-28 ENCOUNTER — Ambulatory Visit (INDEPENDENT_AMBULATORY_CARE_PROVIDER_SITE_OTHER): Payer: Medicare HMO | Admitting: Psychology

## 2020-07-28 ENCOUNTER — Encounter: Payer: Medicare HMO | Admitting: Physical Therapy

## 2020-07-28 DIAGNOSIS — R4189 Other symptoms and signs involving cognitive functions and awareness: Secondary | ICD-10-CM | POA: Diagnosis not present

## 2020-07-28 DIAGNOSIS — G4733 Obstructive sleep apnea (adult) (pediatric): Secondary | ICD-10-CM

## 2020-07-28 DIAGNOSIS — F4310 Post-traumatic stress disorder, unspecified: Secondary | ICD-10-CM | POA: Diagnosis not present

## 2020-07-28 NOTE — Progress Notes (Signed)
   Neuropsychology Feedback Session Danielle Buchanan. [facility] Department of Neurology  Reason for Referral:   Danielle Buchanan a 64 y.o. right-handed Caucasian female referred by Nita Sickle, D.O.,to characterize hercurrent cognitive functioning and assist with diagnostic clarity and treatment planning in the context of subjective cognitive decline and several medical/cardiovascular and psychiatric comorbidities.   Feedback:   Danielle Buchanan completed a comprehensive neuropsychological evaluation on 07/21/2020. Please refer to that encounter for the full report and recommendations. Overall, interpretation of Danielle Buchanan's cognitive testing is challenging due to her limited educational history. Based upon normative comparisons, her pattern of performance is suggestive of cognitive impairments across several domains including processing speed, executive functioning, verbal fluency, and learning and memory. Performance variability was exhibited across visuospatial functioning, while performance was appropriate across attention/concentration, receptive language, and confrontation naming. Relative to her previous evaluation in 2017, performances were largely stable. She did appear to exhibit mild declines across verbal fluency and some memory measures. Specific to the latter, these were seen while learning story-based information, as well as across retrieval/consolidation aspects of a list learning task. Mild improvements were seen across receptive language and confrontation naming. Improvements across depressive symptoms were also noted via mood-related questionnaires. It is possible, as previously theorized by Dr. Dimas Chyle, that current cognitive deficits are related to longstanding limitations in educational attainment, exacerbated by mood and sleep-related factors. There is also a good likelihood for an underlying vascular contribution given her numerous medical ailments. However, her  most recent brain MRI was completed in 2017 and updated imaging is likely warranted to comment on this further. Her current performance was largely stable relative to her previous evaluation and areas which did show mild declines did not demonstrate a pattern concerning for Alzheimer's disease or another degenerative illness.  Danielle Buchanan was accompanied by her nephew during the current feedback appointment. Content of the current session focused on the results of her neuropsychological evaluation. Danielle Buchanan and her nephew were given the opportunity to ask questions and their questions were answered. They were encouraged to reach out should additional questions arise. A copy of her report was provided at the conclusion of the visit.      16 minutes were spent conducting the current feedback session with Danielle Buchanan, billed as one unit [phone].

## 2020-07-28 NOTE — Patient Instructions (Signed)
Given Danielle Buchanan's nephew's report of newly emerging cognitive difficulties and behavioral changes (including incontinence), I recommend that she complete updated neuroimaging (e.g., brain MRI) to assess for any sub-acute intracranial abnormalities or evidence for hydrocephalus. This will also allow for the assessment of any anatomical changes since her previous 2017 scan.  During interview, Danielle Buchanan denied a prior history of sleep apnea. However, medical records suggest a history of obstructive sleep apnea and that she has been prescribed a CPAP machine in the past. Danielle Buchanan is strongly encouraged to use her CPAP machine nightly as directed. Untreated sleep apnea will certainly impact her day-to-day cognitive functioning. If left untreated, it would increase her risk for heart attack, stroke, and future cognitive decline (i.e., dementia).  A combination of medication and psychotherapy has been shown to be most effective at treating symptoms of anxiety and depression. As such, Danielle Buchanan is encouraged to speak with her prescribing physician regarding medication adjustments to optimally manage these symptoms. Likewise, Danielle Buchanan is encouraged to consider engaging in short-term psychotherapy to address symptoms of psychiatric distress. She would benefit from an active and collaborative therapeutic environment, rather than one purely supportive in nature. Recommended treatment modalities include Cognitive Behavioral Therapy (CBT) or Acceptance and Commitment Therapy (ACT).  Should there be a progression of her current deficits over time, Danielle Buchanan is unlikely to regain any independent living skills lost. Therefore, it is recommended that she remain as involved as possible in all aspects of household chores, finances, and medication management, with supervision to ensure adequate performance. She will likely benefit from the establishment and maintenance of a routine in order to maximize her functional  abilities over time.  It will be important for Danielle Buchanan to have another person with her when in situations where she may need to process information, weigh the pros and cons of different options, and make decisions, in order to ensure that she fully understands and recalls all information to be considered.  Danielle Buchanan is encouraged to attend to lifestyle factors for brain health (e.g., regular physical exercise, good nutrition habits, regular participation in cognitively-stimulating activities, and general stress management techniques), which are likely to have benefits for both emotional adjustment and cognition. In fact, in addition to promoting good general health, regular exercise incorporating aerobic activities (e.g., brisk walking, jogging, cycling, etc.) has been demonstrated to be a very effective treatment for depression and stress, with similar efficacy rates to both antidepressant medication and psychotherapy. Optimal control of vascular risk factors (including safe cardiovascular exercise and adherence to dietary recommendations) is encouraged.  If interested, there are some activities which have therapeutic value and can be useful in keeping her cognitively stimulated. For suggestions, Danielle Buchanan is encouraged to go to the following website: https://www.[facility].org/get-to-know-barrow/centers-programs/neurorehabilitation-center/neuro-rehab-apps-and-games/ which has options, categorized by level of difficulty. It should be noted that these activities should not be viewed as a substitute for therapy.  When learning new information, she would benefit from information being broken up into small, manageable pieces. She may also find it helpful to articulate the material in her own words and in a context to promote encoding at the onset of a new task. This material may need to be repeated multiple times to promote encoding.  Memory can be improved using internal strategies such as  rehearsal, repetition, chunking, mnemonics, association, and imagery. External strategies such as written notes in a consistently used memory journal, visual and nonverbal auditory cues such as a calendar on the refrigerator or appointments with alarm,  such as on a cell phone, can also help maximize recall.    To address problems with processing speed, she may wish to consider:   -Ensuring that she is alerted when essential material or instructions are being presented   -Adjusting the speed at which new information is presented   -Allowing for more time in comprehending, processing, and responding in conversation  To address problems with fluctuating attention and executive dysfunction, she may wish to consider:   -Avoiding external distractions when needing to concentrate   -Limiting exposure to fast paced environments with multiple sensory demands   -Writing down complicated information and using checklists   -Attempting and completing one task at a time (i.e., no multi-tasking)   -Verbalizing aloud each step of a task to maintain focus   -Taking frequent breaks during the completion of steps/tasks to avoid fatigue   -Reducing the amount of information considered at one time

## 2020-08-01 ENCOUNTER — Ambulatory Visit (INDEPENDENT_AMBULATORY_CARE_PROVIDER_SITE_OTHER): Payer: Medicare HMO | Admitting: Physical Therapy

## 2020-08-01 ENCOUNTER — Other Ambulatory Visit: Payer: Self-pay

## 2020-08-01 ENCOUNTER — Encounter: Payer: Self-pay | Admitting: Physical Therapy

## 2020-08-01 DIAGNOSIS — M25652 Stiffness of left hip, not elsewhere classified: Secondary | ICD-10-CM | POA: Diagnosis not present

## 2020-08-01 DIAGNOSIS — M6281 Muscle weakness (generalized): Secondary | ICD-10-CM | POA: Diagnosis not present

## 2020-08-01 DIAGNOSIS — R2689 Other abnormalities of gait and mobility: Secondary | ICD-10-CM | POA: Diagnosis not present

## 2020-08-01 DIAGNOSIS — R2681 Unsteadiness on feet: Secondary | ICD-10-CM | POA: Diagnosis not present

## 2020-08-01 DIAGNOSIS — M79641 Pain in right hand: Secondary | ICD-10-CM

## 2020-08-01 DIAGNOSIS — R293 Abnormal posture: Secondary | ICD-10-CM | POA: Diagnosis not present

## 2020-08-01 NOTE — Therapy (Signed)
[facility] Physical Therapy [address] [place], [place], [address] Phone: [phone]   Fax:  [phone]  Physical Therapy Treatment  Patient Details  Name: Danielle Buchanan MRN: [id number] Date of Birth: 09-30-1956 Referring Provider (PT): Maurice Small, MD   Encounter Date: 08/01/2020   PT End of Session - 08/01/20 1302    Visit Number 18    Number of Visits 97    Date for PT Re-Evaluation 08/26/20    Authorization Type [employer]    Authorization Time Period $10 co-pay,    Progress Note Due on Visit 20    PT Start Time 1300    PT Stop Time 1344    PT Time Calculation (min) 44 min    Equipment Utilized During Treatment Gait belt    Activity Tolerance Patient tolerated treatment well    Behavior During Therapy Flat affect;WFL for tasks assessed/performed           Past Medical History:  Diagnosis Date   Allergic rhinitis    Amputation of left lower extremity above knee 08/23/2017   Amputation stump infection 08/23/2017   Atherosclerosis of native arteries of the extremities with gangrene 11/01/2017   2/4-2/15/2020 infected/necrotic third right toe 2/5 transmetatarsal amputation of toes 2-5 with removal of the big toe 2/12 by Dr Trula Slade All margins clean; Bactrim prescribed for Serratia osteomyelitis   DM (diabetes mellitus), secondary, uncontrolled, with peripheral vascular complications    Current 11/12/2018 A1c 10.9%   Dyslipidemia    Essential hypertension, benign    Gangrene of right foot 01/17/2019   Hyperkalemia    Hyponatremia 06/10/2017   Insomnia    Leukocytosis    Mixed stress and urge urinary incontinence 06/11/2019   OSA (obstructive sleep apnea)    no cpap   Osteomyelitis of third toe of right foot 11/12/2018   PAD (peripheral artery disease) 07/23/2017   Pain in finger of right hand 02/20/2018   PTSD (post-traumatic stress disorder)    Right below-knee amputee 01/23/2019   Ulnar neuropathy of left upper extremity  10/23/2018    Past Surgical History:  Procedure Laterality Date   ABDOMINAL AORTOGRAM W/LOWER EXTREMITY N/A 06/24/2017   Procedure: ABDOMINAL AORTOGRAM W/LOWER EXTREMITY;  Surgeon: Waynetta Sandy, MD;  Location: [place] [facility];  Service: Cardiovascular;  Laterality: N/A;   ABDOMINAL AORTOGRAM W/LOWER EXTREMITY Right 10/09/2017   Procedure: ABDOMINAL AORTOGRAM W/LOWER EXTREMITY;  Surgeon: Waynetta Sandy, MD;  Location: [place] [facility];  Service: Cardiovascular;  Laterality: Right;   AMPUTATION Left 07/23/2017   Procedure: AMPUTATION  BELOW KNEE;  Surgeon: Waynetta Sandy, MD;  Location: [place];  Service: Vascular;  Laterality: Left;   AMPUTATION Left 08/12/2017   Procedure: REVISION BELOW KNEE;  Surgeon: Waynetta Sandy, MD;  Location: [place];  Service: Vascular;  Laterality: Left;   AMPUTATION Left 08/27/2017   Procedure: left ABOVE KNEE AMPUTATION;  Surgeon: Waynetta Sandy, MD;  Location: [place];  Service: Vascular;  Laterality: Left;   AMPUTATION Left 08/30/2017   Procedure: REVISION AMPUTATION ABOVE KNEE;  Surgeon: Serafina Mitchell, MD;  Location: [place];  Service: Vascular;  Laterality: Left;   AMPUTATION Right 11/04/2017   Procedure: AMPUTATION RIGHT FOURTH TOE;  Surgeon: Waynetta Sandy, MD;  Location: [place];  Service: Vascular;  Laterality: Right;   AMPUTATION Right 11/12/2018   Procedure: AMPUTATION OF second, third and fifth toes;  Surgeon: Serafina Mitchell, MD;  Location: [place];  Service: Vascular;  Laterality: Right;  AMPUTATION Right 01/19/2019   Procedure: AMPUTATION BELOW KNEE;  Surgeon: Elam Dutch, MD;  Location: [place];  Service: Vascular;  Laterality: Right;   APPLICATION OF WOUND VAC Left 08/27/2017   Procedure: APPLICATION OF WOUND VAC;  Surgeon: Waynetta Sandy, MD;  Location: [place];  Service: Vascular;  Laterality: Left;   CARPAL TUNNEL RELEASE Right    COLONOSCOPY     LOWER  EXTREMITY ANGIOGRAM Right 11/12/2018   Procedure: Cyril Loosen OF Right LEG;  Surgeon: Serafina Mitchell, MD;  Location: [place];  Service: Vascular;  Laterality: Right;   LOWER EXTREMITY INTERVENTION Right 10/09/2017   Procedure: LOWER EXTREMITY INTERVENTION;  Surgeon: Waynetta Sandy, MD;  Location: [place] [facility];  Service: Cardiovascular;  Laterality: Right;   TOE AMPUTATION Right 11/12/2018   2ND 3RD & 5TH TOE    TRANSMETATARSAL AMPUTATION Right 11/19/2018   Procedure: TRANSMETATARSAL AMPUTATION REVISION;  Surgeon: Serafina Mitchell, MD;  Location: [place];  Service: Vascular;  Laterality: Right;    There were no vitals filed for this visit.   Subjective Assessment - 08/01/20 1300    Subjective She got flu shot over weekend. She has been wearing prosthesis when awake. She is able to get TTA prosthsis off using painters tape.    Patient Stated Goals Walk with 2 prostheses in home mostly and maybe limited community.    Currently in Pain? Yes    Pain Score 5     Pain Location Hand   little fingers   Pain Orientation Right;Left    Pain Descriptors / Indicators Numbness;Tingling    Pain Type Chronic pain;Neuropathic pain    Pain Onset More than a month ago    Pain Frequency Constant    Aggravating Factors  using hands and weight bearing.    Pain Relieving Factors meds, heat & ice                             [facility] Adult PT Treatment/Exercise - 08/01/20 1300      Transfers   Transfers Sit to Stand;Stand to [employer] Transfers    Sit to Stand 3: Mod assist;With upper extremity assist;With armrests;From chair/3-in-1;Other (comment)   to //bars & to RW   Sit to Stand Details Tactile cues for weight shifting;Visual cues for safe use of DME/AE;Verbal cues for sequencing;Verbal cues for technique;Verbal cues for safe use of DME/AE;Manual facilitation for weight bearing;Manual facilitation for weight shifting    Stand to Sit 4: Min assist;With upper extremity  assist;With armrests;To chair/3-in-1;Other (comment)   from //bars & from RW   Stand to Sit Details (indicate cue type and reason) Tactile cues for weight shifting;Visual cues for safe use of DME/AE;Verbal cues for technique;Verbal cues for safe use of DME/AE;Manual facilitation for weight shifting      Ambulation/Gait   Ambulation/Gait Yes    Ambulation/Gait Assistance 2: Max assist   MaxA 1 person in //bars & MaxA 2 persons RW   Ambulation/Gait Assistance Details verbal & tactile/manual cues on sequence, upright posture & powering RLE in stance.  Turning to position to sit in w/c with cues on sequence, foot position & technique.     Ambulation Distance (Feet) 8 Feet   //bars 8' and RW 8' straight, 3' x 2 w/90* turn to sit   Assistive device Parallel bars;Prostheses    Gait Pattern Step-to pattern;Decreased step length - right;Decreased stance time - left;Decreased stride length;Antalgic;Lateral hip instability;Trunk flexed;Wide base  of support;Abducted - left;Poor foot clearance - left;Poor foot clearance - right    Ambulation Surface Level;Indoor      Exercises   Exercises Knee/Hip      Knee/Hip Exercises: Stretches   Passive Hamstring Stretch --    Passive Hamstring Stretch Limitations --      Knee/Hip Exercises: Aerobic   Nustep BUEs & RLE (TTA) level 5 for 5 minutes      Knee/Hip Exercises: Machines for Strengthening   Cybex Leg Press --      Prosthetics   Prosthetic Care Comments  --    Current prosthetic wear tolerance (days/week)  daily (TTA prosthesis & TFA liner only)    Current prosthetic wear tolerance (#hours/day)  3-5hrs 2-3x/day    Current prosthetic weight-bearing tolerance (hours/day)  No pain or discomfort with standing in //bars.     Edema pitting edema TTA limb    Residual limb condition  right TTA has medium red rash over distal limb but no signs of infection.  bulbous edematous shape.  no open areas.    TFA no issues noted.     Education Provided --                     PT Short Term Goals - 08/01/20 1338      PT SHORT TERM GOAL #1   Title Patient tolerates wearing TTA prosthesis >10 hrs total / day without increase in skin issues. (All STGs Target Date: 07/29/2020)    Baseline MET 08/01/2020    Time 1    Period Months    Status Achieved    Target Date 07/29/20      PT SHORT TERM GOAL #2   Title Patient donnes transtibial prosthesis with supervision    Baseline MET 08/01/2020    Time 1    Period Months    Status Achieved    Target Date 07/29/20      PT SHORT TERM GOAL #3   Title Sit to stand w/c to RW with moderate assist.    Baseline MET 08/01/2020    Time 1    Period Months    Status Achieved    Target Date 07/29/20      PT SHORT TERM GOAL #4   Title Stand pivot transfer with RW & bilateral prostheses with moderate assist.    Time 1    Period Months    Status On-going    Target Date 07/29/20      PT SHORT TERM GOAL #5   Title Patient ambulates 10' with RW & bilateral prostheses with maxA.    Baseline Partially MET 08/01/2020    Time 1    Period Months    Status Partially Met    Target Date 07/29/20             PT Long Term Goals - 06/29/20 1613      PT LONG TERM GOAL #1   Title Patient tolerates wear of Transtibial prosthesis and Transfemoral liner  >80% of awake hours and Transfemoral prosthesis >5hours total / day without skin or limb pain issues.  (All Target Date 11/25/2020)    Time 6    Period Months    Status On-going      PT LONG TERM GOAL #2   Title Patient verbalizes and demonstrates understading of proper prosthetic care including donning both prostheses.    Time 6    Period Months    Status On-going      PT  LONG TERM GOAL #3   Title Patient's standing balance with RW & bilateral prostheses modified independent: sit to/from stand chairs with armrests, reaching 5" anteriorly & knee level and managing clothes.    Time 6    Period Months    Status On-going      PT LONG TERM GOAL #4    Title Patient ambulates 31' around furniture with RW & bilateral prostheses modified independent.    Time 6    Period Months    Status On-going                 Plan - 08/01/20 1302    Clinical Impression Statement Patient met or partially met all STGs set for this 30-day period.  She reports improved consistency now with doffing prosthesis either using cane tip or tape as PT instructed.    Personal Factors and Comorbidities Comorbidity 3+;Education;Fitness;Past/Current Experience;Social Background;Time since onset of injury/illness/exacerbation    Comorbidities Rt TTA, Lt TFA, DM, HTN, PVD, depression, right Carpal Tunnel Release surgery    Examination-Activity Limitations Locomotion Level;Squat;Stand;Transfers;Stairs    Examination-Participation Restrictions Community Activity    Stability/Clinical Decision Making Evolving/Moderate complexity    Rehab Potential Good    PT Frequency 2x / week    PT Duration Other (comment)   6 months (26 weeks)   PT Treatment/Interventions ADLs/Self Care Home Management;DME Instruction;Gait training;Stair training;Functional mobility training;Therapeutic activities;Therapeutic exercise;Balance training;Neuromuscular re-education;Patient/family education;Prosthetic Training;Manual techniques;Scar mobilization;Passive range of motion;Vasopneumatic Device;Vestibular    PT Next Visit Plan set updated STGs,  standing & gait with RW    Consulted and Agree with Plan of Care Patient;Family member/caregiver    Family Member Consulted nephew / caregiver Mora Bellman           Patient will benefit from skilled therapeutic intervention in order to improve the following deficits and impairments:  Abnormal gait, Cardiopulmonary status limiting activity, Decreased activity tolerance, Decreased balance, Decreased endurance, Decreased knowledge of use of DME, Decreased mobility, Decreased range of motion, Decreased skin integrity, Decreased scar mobility,  Decreased strength, Dizziness, Increased edema, Impaired flexibility, Postural dysfunction, Prosthetic Dependency, Obesity, Pain  Visit Diagnosis: Unsteadiness on feet  Muscle weakness (generalized)  Other abnormalities of gait and mobility  Abnormal posture  Pain in right hand  Stiffness of left hip, not elsewhere classified     Problem List Patient Active Problem List   Diagnosis Date Noted   PTSD (post-traumatic stress disorder)    Mixed stress and urge urinary incontinence 06/11/2019   Right below-knee amputee 01/23/2019   Ulnar neuropathy of left upper extremity 10/23/2018   Pain in finger of right hand 02/20/2018   Atherosclerosis of native arteries of the extremities with gangrene 11/01/2017   OSA (obstructive sleep apnea)    Benign essential HTN    DM (diabetes mellitus), secondary, uncontrolled, with peripheral vascular complications    Hyperkalemia    Leukocytosis    Acute blood loss anemia    Amputation of left lower extremity above knee 08/23/2017   PAD (peripheral artery disease) 07/23/2017   Cellulitis 06/10/2017   Hyponatremia 06/10/2017   Fever 06/10/2017   Rash 05/24/2017   Allergic rhinitis    Insomnia     Jamey Reas, PT, DPT 08/01/2020, 2:10 PM  [facility] Physical Therapy [address] [place], [place], [address] Phone: [phone]   Fax:  [phone]  Name: TYKEISHA PEER MRN: [id number] Date of Birth: 1956/08/03

## 2020-08-03 ENCOUNTER — Encounter: Payer: Self-pay | Admitting: Physical Therapy

## 2020-08-03 ENCOUNTER — Other Ambulatory Visit: Payer: Self-pay

## 2020-08-03 ENCOUNTER — Ambulatory Visit (INDEPENDENT_AMBULATORY_CARE_PROVIDER_SITE_OTHER): Payer: Medicare HMO | Admitting: Physical Therapy

## 2020-08-03 DIAGNOSIS — R293 Abnormal posture: Secondary | ICD-10-CM | POA: Diagnosis not present

## 2020-08-03 DIAGNOSIS — R2681 Unsteadiness on feet: Secondary | ICD-10-CM | POA: Diagnosis not present

## 2020-08-03 DIAGNOSIS — M6281 Muscle weakness (generalized): Secondary | ICD-10-CM

## 2020-08-03 DIAGNOSIS — M25652 Stiffness of left hip, not elsewhere classified: Secondary | ICD-10-CM | POA: Diagnosis not present

## 2020-08-03 DIAGNOSIS — R2689 Other abnormalities of gait and mobility: Secondary | ICD-10-CM

## 2020-08-03 DIAGNOSIS — M79641 Pain in right hand: Secondary | ICD-10-CM

## 2020-08-03 NOTE — Therapy (Signed)
[facility] [address] [place], [place], [address] Phone: [phone]   Fax:  [phone]  Physical Therapy Treatment  Patient Details  Name: Danielle Buchanan MRN: [id number] Date of Birth: 08-Oct-1956 Referring Provider (PT): Maurice Small, MD   Encounter Date: 08/03/2020   PT End of Session - 08/03/20 1306    Visit Number 19    Number of Visits 37    Date for PT Re-Evaluation 08/26/20    Authorization Type [employer]    Authorization Time Period $10 co-pay,    Progress Note Due on Visit 20    PT Start Time 1300    PT Stop Time 1345    PT Time Calculation (min) 45 min    Equipment Utilized During Treatment Gait belt    Activity Tolerance Patient tolerated treatment well    Behavior During Therapy Flat affect;WFL for tasks assessed/performed           Past Medical History:  Diagnosis Date  . Allergic rhinitis   . Amputation of left lower extremity above knee 08/23/2017  . Amputation stump infection 08/23/2017  . Atherosclerosis of native arteries of the extremities with gangrene 11/01/2017   2/4-2/15/2020 infected/necrotic third right toe 2/5 transmetatarsal amputation of toes 2-5 with removal of the big toe 2/12 by Dr Trula Slade All margins clean; Bactrim prescribed for Serratia osteomyelitis  . DM (diabetes mellitus), secondary, uncontrolled, with peripheral vascular complications    Current 11/12/2018 A1c 10.9%  . Dyslipidemia   . Essential hypertension, benign   . Gangrene of right foot 01/17/2019  . Hyperkalemia   . Hyponatremia 06/10/2017  . Insomnia   . Leukocytosis   . Mixed stress and urge urinary incontinence 06/11/2019  . OSA (obstructive sleep apnea)    no cpap  . Osteomyelitis of third toe of right foot 11/12/2018  . PAD (peripheral artery disease) 07/23/2017  . Pain in finger of right hand 02/20/2018  . PTSD (post-traumatic stress disorder)   . Right below-knee amputee 01/23/2019  . Ulnar neuropathy of left upper extremity  10/23/2018    Past Surgical History:  Procedure Laterality Date  . ABDOMINAL AORTOGRAM W/LOWER EXTREMITY N/A 06/24/2017   Procedure: ABDOMINAL AORTOGRAM W/LOWER EXTREMITY;  Surgeon: Waynetta Sandy, MD;  Location: [place] [facility];  Service: Cardiovascular;  Laterality: N/A;  . ABDOMINAL AORTOGRAM W/LOWER EXTREMITY Right 10/09/2017   Procedure: ABDOMINAL AORTOGRAM W/LOWER EXTREMITY;  Surgeon: Waynetta Sandy, MD;  Location: [place] [facility];  Service: Cardiovascular;  Laterality: Right;  . AMPUTATION Left 07/23/2017   Procedure: AMPUTATION  BELOW KNEE;  Surgeon: Waynetta Sandy, MD;  Location: [place];  Service: Vascular;  Laterality: Left;  . AMPUTATION Left 08/12/2017   Procedure: REVISION BELOW KNEE;  Surgeon: Waynetta Sandy, MD;  Location: [place];  Service: Vascular;  Laterality: Left;  . AMPUTATION Left 08/27/2017   Procedure: left ABOVE KNEE AMPUTATION;  Surgeon: Waynetta Sandy, MD;  Location: [place];  Service: Vascular;  Laterality: Left;  . AMPUTATION Left 08/30/2017   Procedure: REVISION AMPUTATION ABOVE KNEE;  Surgeon: Serafina Mitchell, MD;  Location: [place];  Service: Vascular;  Laterality: Left;  . AMPUTATION Right 11/04/2017   Procedure: AMPUTATION RIGHT FOURTH TOE;  Surgeon: Waynetta Sandy, MD;  Location: [place];  Service: Vascular;  Laterality: Right;  . AMPUTATION Right 11/12/2018   Procedure: AMPUTATION OF second, third and fifth toes;  Surgeon: Serafina Mitchell, MD;  Location: [place];  Service: Vascular;  Laterality: Right;  .  AMPUTATION Right 01/19/2019   Procedure: AMPUTATION BELOW KNEE;  Surgeon: Elam Dutch, MD;  Location: [place];  Service: Vascular;  Laterality: Right;  . APPLICATION OF WOUND VAC Left 08/27/2017   Procedure: APPLICATION OF WOUND VAC;  Surgeon: Waynetta Sandy, MD;  Location: [place];  Service: Vascular;  Laterality: Left;  . CARPAL TUNNEL RELEASE Right   . COLONOSCOPY    . LOWER  EXTREMITY ANGIOGRAM Right 11/12/2018   Procedure: ANGIOGRAM OF Right LEG;  Surgeon: Serafina Mitchell, MD;  Location: [place];  Service: Vascular;  Laterality: Right;  . LOWER EXTREMITY INTERVENTION Right 10/09/2017   Procedure: LOWER EXTREMITY INTERVENTION;  Surgeon: Waynetta Sandy, MD;  Location: [place] [facility];  Service: Cardiovascular;  Laterality: Right;  . TOE AMPUTATION Right 11/12/2018   2ND 3RD & 5TH TOE   . TRANSMETATARSAL AMPUTATION Right 11/19/2018   Procedure: TRANSMETATARSAL AMPUTATION REVISION;  Surgeon: Serafina Mitchell, MD;  Location: [place];  Service: Vascular;  Laterality: Right;    There were no vitals filed for this visit.   Subjective Assessment - 08/03/20 1300    Subjective She is working on getting TTA prosthesis off and it is slowly getting better.    Patient Stated Goals Walk with 2 prostheses in home mostly and maybe limited community.    Pain Score 3     Pain Location Hand   little finger area   Pain Orientation Left;Right    Pain Descriptors / Indicators Aching;Pins and needles    Pain Type Chronic pain;Neuropathic pain    Pain Onset More than a month ago    Pain Frequency Constant    Aggravating Factors  using hands    Pain Relieving Factors resting & changing position of hands                             [facility] Adult PT Treatment/Exercise - 08/03/20 1300      Transfers   Transfers Sit to Stand;Stand to [employer] Transfers    Sit to Stand 3: Mod assist;With upper extremity assist;With armrests;From chair/3-in-1;Other (comment)   to //bars & to RW   Sit to Stand Details Tactile cues for weight shifting;Visual cues for safe use of DME/AE;Verbal cues for sequencing;Verbal cues for technique;Verbal cues for safe use of DME/AE;Manual facilitation for weight bearing;Manual facilitation for weight shifting    Stand to Sit 4: Min assist;With upper extremity assist;With armrests;To chair/3-in-1;Other (comment)   from //bars & from RW     Stand to Sit Details (indicate cue type and reason) Tactile cues for weight shifting;Visual cues for safe use of DME/AE;Verbal cues for technique;Verbal cues for safe use of DME/AE;Manual facilitation for weight shifting      Ambulation/Gait   Ambulation/Gait --    Ambulation/Gait Assistance --    Ambulation Distance (Feet) --    Assistive device --    Gait Pattern --      Exercises   Exercises --      Knee/Hip Exercises: Aerobic   Nustep --      Prosthetics   Prosthetic Care Comments  worked on standing & donning TFA prosthesis including tightening suspension strap at home - see pt instructions    Current prosthetic wear tolerance (days/week)  daily (TTA prosthesis & TFA liner only)    Current prosthetic wear tolerance (#hours/day)  3-5hrs 2-3x/day.  PT recommended wearing liner when napping in recliner and donning prosthesis when getting out of  recliner.  Need to remove liner & wear shrinker for 4-6hours /day to enable skin to dry.     Current prosthetic weight-bearing tolerance (hours/day)  No pain or discomfort with standing in //bars.     Edema pitting edema TTA limb    Residual limb condition  right TTA has medium red rash over distal limb but no signs of infection.  bulbous edematous shape.  no open areas.    TFA no issues noted.     Education Provided Skin check;Residual limb care;Proper Donning;Proper wear schedule/adjustment    Person(s) Educated Patient;Caregiver(s)    Education Method Explanation;Demonstration;Tactile cues;Verbal cues    Education Method Verbalized understanding;Returned demonstration;Tactile cues required;Verbal cues required;Needs further instruction                    PT Short Term Goals - 08/01/20 1338      PT SHORT TERM GOAL #1   Title Patient tolerates wearing TTA prosthesis >10 hrs total / day without increase in skin issues. (All STGs Target Date: 07/29/2020)    Baseline MET 08/01/2020    Time 1    Period Months    Status Achieved     Target Date 07/29/20      PT SHORT TERM GOAL #2   Title Patient donnes transtibial prosthesis with supervision    Baseline MET 08/01/2020    Time 1    Period Months    Status Achieved    Target Date 07/29/20      PT SHORT TERM GOAL #3   Title Sit to stand w/c to RW with moderate assist.    Baseline MET 08/01/2020    Time 1    Period Months    Status Achieved    Target Date 07/29/20      PT SHORT TERM GOAL #4   Title Stand pivot transfer with RW & bilateral prostheses with moderate assist.    Time 1    Period Months    Status On-going    Target Date 07/29/20      PT SHORT TERM GOAL #5   Title Patient ambulates 10' with RW & bilateral prostheses with maxA.    Baseline Partially MET 08/01/2020    Time 1    Period Months    Status Partially Met    Target Date 07/29/20             PT Long Term Goals - 06/29/20 1613      PT LONG TERM GOAL #1   Title Patient tolerates wear of Transtibial prosthesis and Transfemoral liner  >80% of awake hours and Transfemoral prosthesis >5hours total / day without skin or limb pain issues.  (All Target Date 11/25/2020)    Time 6    Period Months    Status On-going      PT LONG TERM GOAL #2   Title Patient verbalizes and demonstrates understading of proper prosthetic care including donning both prostheses.    Time 6    Period Months    Status On-going      PT LONG TERM GOAL #3   Title Patient's standing balance with RW & bilateral prostheses modified independent: sit to/from stand chairs with armrests, reaching 5" anteriorly & knee level and managing clothes.    Time 6    Period Months    Status On-going      PT LONG TERM GOAL #4   Title Patient ambulates 63' around furniture with RW & bilateral prostheses modified independent.  Time 6    Period Months    Status On-going                 Plan - 08/03/20 1306    Clinical Impression Statement PT worked on procedure / technique to donne AKA prosthesis at home with  caregivers assist.  Since AKA is locked, she can not face the sink which increases difficulty.  Pt appears can perform from 24" bar stool. Her nephew verbalized general understanding and plans to attempt over weekend when both he & his wife are able to assist.    Personal Factors and Comorbidities Comorbidity 3+;Education;Fitness;Past/Current Experience;Social Background;Time since onset of injury/illness/exacerbation    Comorbidities Rt TTA, Lt TFA, DM, HTN, PVD, depression, right Carpal Tunnel Release surgery    Examination-Activity Limitations Locomotion Level;Squat;Stand;Transfers;Stairs    Examination-Participation Restrictions Community Activity    Stability/Clinical Decision Making Evolving/Moderate complexity    Rehab Potential Good    PT Frequency 2x / week    PT Duration Other (comment)   6 months (26 weeks)   PT Treatment/Interventions ADLs/Self Care Home Management;DME Instruction;Gait training;Stair training;Functional mobility training;Therapeutic activities;Therapeutic exercise;Balance training;Neuromuscular re-education;Patient/family education;Prosthetic Training;Manual techniques;Scar mobilization;Passive range of motion;Vasopneumatic Device;Vestibular    PT Next Visit Plan do 10th visit note, set updated STGs,  standing & gait with RW    Consulted and Agree with Plan of Care Patient;Family member/caregiver    Family Member Consulted nephew / caregiver Mora Bellman           Patient will benefit from skilled therapeutic intervention in order to improve the following deficits and impairments:  Abnormal gait, Cardiopulmonary status limiting activity, Decreased activity tolerance, Decreased balance, Decreased endurance, Decreased knowledge of use of DME, Decreased mobility, Decreased range of motion, Decreased skin integrity, Decreased scar mobility, Decreased strength, Dizziness, Increased edema, Impaired flexibility, Postural dysfunction, Prosthetic Dependency, Obesity,  Pain  Visit Diagnosis: Unsteadiness on feet  Muscle weakness (generalized)  Other abnormalities of gait and mobility  Abnormal posture  Pain in right hand  Stiffness of left hip, not elsewhere classified     Problem List Patient Active Problem List   Diagnosis Date Noted  . PTSD (post-traumatic stress disorder)   . Mixed stress and urge urinary incontinence 06/11/2019  . Right below-knee amputee 01/23/2019  . Ulnar neuropathy of left upper extremity 10/23/2018  . Pain in finger of right hand 02/20/2018  . Atherosclerosis of native arteries of the extremities with gangrene 11/01/2017  . OSA (obstructive sleep apnea)   . Benign essential HTN   . DM (diabetes mellitus), secondary, uncontrolled, with peripheral vascular complications   . Hyperkalemia   . Leukocytosis   . Acute blood loss anemia   . Amputation of left lower extremity above knee 08/23/2017  . PAD (peripheral artery disease) 07/23/2017  . Cellulitis 06/10/2017  . Hyponatremia 06/10/2017  . Fever 06/10/2017  . Rash 05/24/2017  . Allergic rhinitis   . Insomnia     Danielle Buchanan, PT, DPT 08/03/2020, 4:36 PM  [facility] [address] [place], [place], [address] Phone: [phone]   Fax:  [phone]  Name: Danielle Buchanan MRN: [id number] Date of Birth: 1956/03/27

## 2020-08-03 NOTE — Patient Instructions (Signed)
Donne below knee prosthesis & above knee liner / sock seated in w/c Transfer w/c to 24" bar stool with RLE on floor.   Turn so right side is to sink with walker in front of you.  Donne above knee in sitting tightening strap as much as possible. Lock above knee prosthesis and stand from 24" bar stool pressing on RW. Make sure you straighten right knee. Caregiver to tighten above knee strap while pt stands as upright as possible.   Sit back on bar stool leaving above knee prosthesis locked.   When not standing any more, switch w/c behind and sit down to w/c.

## 2020-08-08 ENCOUNTER — Other Ambulatory Visit: Payer: Self-pay

## 2020-08-08 ENCOUNTER — Ambulatory Visit (INDEPENDENT_AMBULATORY_CARE_PROVIDER_SITE_OTHER): Payer: Medicare HMO | Admitting: Physical Therapy

## 2020-08-08 DIAGNOSIS — R2689 Other abnormalities of gait and mobility: Secondary | ICD-10-CM | POA: Diagnosis not present

## 2020-08-08 DIAGNOSIS — R2681 Unsteadiness on feet: Secondary | ICD-10-CM

## 2020-08-08 DIAGNOSIS — M6281 Muscle weakness (generalized): Secondary | ICD-10-CM | POA: Diagnosis not present

## 2020-08-08 DIAGNOSIS — R293 Abnormal posture: Secondary | ICD-10-CM | POA: Diagnosis not present

## 2020-08-08 NOTE — Therapy (Signed)
[facility] Physical Therapy [address] [place], [place], [address] Phone: [phone]   Fax:  [phone]  Physical Therapy Treatment/Progress note Progress Note reporting period date 06/29/20 to 08/08/20  See below for objective and subjective measurements relating to patients progress with PT.   Patient Details  Name: Danielle Buchanan MRN: [id number] Date of Birth: 10/26/1955 Referring Provider (PT): Maurice Small, MD   Encounter Date: 08/08/2020   PT End of Session - 08/08/20 1357    Visit Number 20    Number of Visits 64    Date for PT Re-Evaluation 08/26/20    Authorization Type [employer]    Authorization Time Period $10 co-pay,    Progress Note Due on Visit 30    PT Start Time 1300    PT Stop Time 1340    PT Time Calculation (min) 40 min    Equipment Utilized During Treatment Gait belt    Activity Tolerance Patient tolerated treatment well    Behavior During Therapy Flat affect;WFL for tasks assessed/performed           Past Medical History:  Diagnosis Date  . Allergic rhinitis   . Amputation of left lower extremity above knee 08/23/2017  . Amputation stump infection 08/23/2017  . Atherosclerosis of native arteries of the extremities with gangrene 11/01/2017   2/4-2/15/2020 infected/necrotic third right toe 2/5 transmetatarsal amputation of toes 2-5 with removal of the big toe 2/12 by Dr Trula Slade All margins clean; Bactrim prescribed for Serratia osteomyelitis  . DM (diabetes mellitus), secondary, uncontrolled, with peripheral vascular complications    Current 11/12/2018 A1c 10.9%  . Dyslipidemia   . Essential hypertension, benign   . Gangrene of right foot 01/17/2019  . Hyperkalemia   . Hyponatremia 06/10/2017  . Insomnia   . Leukocytosis   . Mixed stress and urge urinary incontinence 06/11/2019  . OSA (obstructive sleep apnea)    no cpap  . Osteomyelitis of third toe of right foot 11/12/2018  . PAD (peripheral artery disease) 07/23/2017  .  Pain in finger of right hand 02/20/2018  . PTSD (post-traumatic stress disorder)   . Right below-knee amputee 01/23/2019  . Ulnar neuropathy of left upper extremity 10/23/2018    Past Surgical History:  Procedure Laterality Date  . ABDOMINAL AORTOGRAM W/LOWER EXTREMITY N/A 06/24/2017   Procedure: ABDOMINAL AORTOGRAM W/LOWER EXTREMITY;  Surgeon: Waynetta Sandy, MD;  Location: [place] [facility];  Service: Cardiovascular;  Laterality: N/A;  . ABDOMINAL AORTOGRAM W/LOWER EXTREMITY Right 10/09/2017   Procedure: ABDOMINAL AORTOGRAM W/LOWER EXTREMITY;  Surgeon: Waynetta Sandy, MD;  Location: [place] [facility];  Service: Cardiovascular;  Laterality: Right;  . AMPUTATION Left 07/23/2017   Procedure: AMPUTATION  BELOW KNEE;  Surgeon: Waynetta Sandy, MD;  Location: [place];  Service: Vascular;  Laterality: Left;  . AMPUTATION Left 08/12/2017   Procedure: REVISION BELOW KNEE;  Surgeon: Waynetta Sandy, MD;  Location: [place];  Service: Vascular;  Laterality: Left;  . AMPUTATION Left 08/27/2017   Procedure: left ABOVE KNEE AMPUTATION;  Surgeon: Waynetta Sandy, MD;  Location: [place];  Service: Vascular;  Laterality: Left;  . AMPUTATION Left 08/30/2017   Procedure: REVISION AMPUTATION ABOVE KNEE;  Surgeon: Serafina Mitchell, MD;  Location: [place];  Service: Vascular;  Laterality: Left;  . AMPUTATION Right 11/04/2017   Procedure: AMPUTATION RIGHT FOURTH TOE;  Surgeon: Waynetta Sandy, MD;  Location: [place];  Service: Vascular;  Laterality: Right;  . AMPUTATION Right 11/12/2018   Procedure:  AMPUTATION OF second, third and fifth toes;  Surgeon: Serafina Mitchell, MD;  Location: [place];  Service: Vascular;  Laterality: Right;  . AMPUTATION Right 01/19/2019   Procedure: AMPUTATION BELOW KNEE;  Surgeon: Elam Dutch, MD;  Location: [place];  Service: Vascular;  Laterality: Right;  . APPLICATION OF WOUND VAC Left 08/27/2017   Procedure: APPLICATION OF WOUND VAC;   Surgeon: Waynetta Sandy, MD;  Location: [place];  Service: Vascular;  Laterality: Left;  . CARPAL TUNNEL RELEASE Right   . COLONOSCOPY    . LOWER EXTREMITY ANGIOGRAM Right 11/12/2018   Procedure: ANGIOGRAM OF Right LEG;  Surgeon: Serafina Mitchell, MD;  Location: [place];  Service: Vascular;  Laterality: Right;  . LOWER EXTREMITY INTERVENTION Right 10/09/2017   Procedure: LOWER EXTREMITY INTERVENTION;  Surgeon: Waynetta Sandy, MD;  Location: [place] [facility];  Service: Cardiovascular;  Laterality: Right;  . TOE AMPUTATION Right 11/12/2018   2ND 3RD & 5TH TOE   . TRANSMETATARSAL AMPUTATION Right 11/19/2018   Procedure: TRANSMETATARSAL AMPUTATION REVISION;  Surgeon: Serafina Mitchell, MD;  Location: [place];  Service: Vascular;  Laterality: Right;    There were no vitals filed for this visit.   Subjective Assessment - 08/08/20 1349    Subjective relays she has some concern that her Rt leg feels like it is getting weaker    Patient Stated Goals Walk with 2 prostheses in home mostly and maybe limited community.    Currently in Pain? No/denies    Pain Onset More than a month ago              [facility] PT Assessment - 08/08/20 0001      Assessment   Medical Diagnosis Right Transtibial Amputation Z89.511  & Hx Left Transfermoral Amputation  G86.761    Referring Provider (PT) Maurice Small, MD    Onset Date/Surgical Date 05/19/20      Strength   Right Hip Flexion 4/5    Right Hip Extension 3/5    Right Hip ABduction 3+/5    Left Hip Flexion 4/5    Left Hip Extension 3/5    Left Hip ABduction 3+/5    Right Knee Flexion 4/5    Right Knee Extension 4-/5      Transfers   Transfers Sit to Stand;Stand to Sit;Stand Pivot Transfers    Sit to Stand 3: Mod assist;With upper extremity assist;With armrests;From chair/3-in-1;Other (comment)    Sit to Stand Details Tactile cues for weight shifting;Visual cues for safe use of DME/AE;Verbal cues for sequencing;Verbal cues for  technique;Verbal cues for safe use of DME/AE;Manual facilitation for weight bearing;Manual facilitation for weight shifting    Stand to Sit 4: Min assist;With upper extremity assist;With armrests;To chair/3-in-1;Other (comment)    Stand to Sit Details (indicate cue type and reason) Tactile cues for weight shifting;Visual cues for safe use of DME/AE;Verbal cues for technique;Verbal cues for safe use of DME/AE;Manual facilitation for weight shifting      Ambulation/Gait   Ambulation/Gait Yes    Ambulation/Gait Assistance 2: Max assist    Ambulation/Gait Assistance Details excessive UE weight bearing on //bars, PT manually controlling left TFA movement and blocking / extending right knee in stance     Gait Comments X 5 reps in bars                                   PT Short Term Goals -  08/08/20 1358      PT SHORT TERM GOAL #1   Title Patient tolerates wearing TTA prosthesis >10 hrs total / day without increase in skin issues. (All STGs Target Date: 07/29/2020)    Baseline MET 08/01/2020    Time 1    Period Months    Status Achieved    Target Date 07/29/20      PT SHORT TERM GOAL #2   Title Patient donnes transtibial prosthesis with supervision    Baseline MET 08/01/2020    Time 1    Period Months    Status Achieved    Target Date 07/29/20      PT SHORT TERM GOAL #3   Title Sit to stand w/c to RW with moderate assist.    Baseline MET 08/01/2020    Time 1    Period Months    Status Achieved    Target Date 07/29/20      PT SHORT TERM GOAL #4   Title Stand pivot transfer with RW & bilateral prostheses with moderate assist.    Baseline mod to max    Time 1    Period Months    Status On-going    Target Date 07/29/20      PT SHORT TERM GOAL #5   Title Patient ambulates 10' with RW & bilateral prostheses with maxA.    Baseline Partially MET 08/01/2020    Time 1    Period Months    Status Achieved    Target Date 07/29/20             PT Long  Term Goals - 06/29/20 1613      PT LONG TERM GOAL #1   Title Patient tolerates wear of Transtibial prosthesis and Transfemoral liner  >80% of awake hours and Transfemoral prosthesis >5hours total / day without skin or limb pain issues.  (All Target Date 11/25/2020)    Time 6    Period Months    Status On-going      PT LONG TERM GOAL #2   Title Patient verbalizes and demonstrates understading of proper prosthetic care including donning both prostheses.    Time 6    Period Months    Status On-going      PT LONG TERM GOAL #3   Title Patient's standing balance with RW & bilateral prostheses modified independent: sit to/from stand chairs with armrests, reaching 5" anteriorly & knee level and managing clothes.    Time 6    Period Months    Status On-going      PT LONG TERM GOAL #4   Title Patient ambulates 61' around furniture with RW & bilateral prostheses modified independent.    Time 6    Period Months    Status On-going                 Plan - 08/08/20 1358    Clinical Impression Statement She has made overall fair to good progress with PT and has met 4/5 short term goals. She does still lack strength, endurance, balance, and has difficulty with gait due to increased posterior weight shift and decreased weight shiht onto Lt LE in order to advance Rt leg. She will continue to benefit from skilled PT to address her functional deficits.    Personal Factors and Comorbidities Comorbidity 3+;Education;Fitness;Past/Current Experience;Social Background;Time since onset of injury/illness/exacerbation    Comorbidities Rt TTA, Lt TFA, DM, HTN, PVD, depression, right Carpal Tunnel Release surgery    Examination-Activity Limitations Locomotion Level;Squat;Stand;Transfers;Stairs  Examination-Participation Restrictions Community Activity    Stability/Clinical Decision Making Evolving/Moderate complexity    Rehab Potential Good    PT Frequency 2x / week    PT Duration Other (comment)   6  months (26 weeks)   PT Treatment/Interventions ADLs/Self Care Home Management;DME Instruction;Gait training;Stair training;Functional mobility training;Therapeutic activities;Therapeutic exercise;Balance training;Neuromuscular re-education;Patient/family education;Prosthetic Training;Manual techniques;Scar mobilization;Passive range of motion;Vasopneumatic Device;Vestibular    PT Next Visit Plan set updated STGs,  standing & gait with RW    Consulted and Agree with Plan of Care Patient;Family member/caregiver    Family Member Consulted nephew / caregiver Mora Bellman           Patient will benefit from skilled therapeutic intervention in order to improve the following deficits and impairments:  Abnormal gait, Cardiopulmonary status limiting activity, Decreased activity tolerance, Decreased balance, Decreased endurance, Decreased knowledge of use of DME, Decreased mobility, Decreased range of motion, Decreased skin integrity, Decreased scar mobility, Decreased strength, Dizziness, Increased edema, Impaired flexibility, Postural dysfunction, Prosthetic Dependency, Obesity, Pain  Visit Diagnosis: Unsteadiness on feet  Muscle weakness (generalized)  Other abnormalities of gait and mobility  Abnormal posture     Problem List Patient Active Problem List   Diagnosis Date Noted  . PTSD (post-traumatic stress disorder)   . Mixed stress and urge urinary incontinence 06/11/2019  . Right below-knee amputee 01/23/2019  . Ulnar neuropathy of left upper extremity 10/23/2018  . Pain in finger of right hand 02/20/2018  . Atherosclerosis of native arteries of the extremities with gangrene 11/01/2017  . OSA (obstructive sleep apnea)   . Benign essential HTN   . DM (diabetes mellitus), secondary, uncontrolled, with peripheral vascular complications   . Hyperkalemia   . Leukocytosis   . Acute blood loss anemia   . Amputation of left lower extremity above knee 08/23/2017  . PAD (peripheral  artery disease) 07/23/2017  . Cellulitis 06/10/2017  . Hyponatremia 06/10/2017  . Fever 06/10/2017  . Rash 05/24/2017  . Allergic rhinitis   . Insomnia     Silvestre Mesi 08/08/2020, 2:03 PM  [facility] Physical Therapy [address] [place], [place], [address] Phone: [phone]   Fax:  [phone]  Name: Danielle Buchanan MRN: [id number] Date of Birth: 08/27/1956

## 2020-08-10 ENCOUNTER — Encounter: Payer: Medicare HMO | Admitting: Physical Therapy

## 2020-08-15 ENCOUNTER — Encounter: Payer: Medicare HMO | Admitting: Physical Therapy

## 2020-08-17 ENCOUNTER — Ambulatory Visit (INDEPENDENT_AMBULATORY_CARE_PROVIDER_SITE_OTHER): Payer: Medicare HMO | Admitting: Physical Therapy

## 2020-08-17 ENCOUNTER — Encounter: Payer: Self-pay | Admitting: Physical Therapy

## 2020-08-17 ENCOUNTER — Other Ambulatory Visit: Payer: Self-pay

## 2020-08-17 DIAGNOSIS — R2681 Unsteadiness on feet: Secondary | ICD-10-CM | POA: Diagnosis not present

## 2020-08-17 DIAGNOSIS — M79641 Pain in right hand: Secondary | ICD-10-CM

## 2020-08-17 DIAGNOSIS — R2689 Other abnormalities of gait and mobility: Secondary | ICD-10-CM

## 2020-08-17 DIAGNOSIS — R293 Abnormal posture: Secondary | ICD-10-CM

## 2020-08-17 DIAGNOSIS — M25652 Stiffness of left hip, not elsewhere classified: Secondary | ICD-10-CM | POA: Diagnosis not present

## 2020-08-17 DIAGNOSIS — M6281 Muscle weakness (generalized): Secondary | ICD-10-CM | POA: Diagnosis not present

## 2020-08-17 NOTE — Therapy (Signed)
[facility] Physical Therapy [address] [place], [place], [address] Phone: [phone]   Fax:  [phone]  Physical Therapy Treatment  Patient Details  Name: Danielle Buchanan MRN: [id number] Date of Birth: 12/15/1955 Referring Provider (PT): [name], MD   Encounter Date: 08/17/2020   PT End of Session - 08/17/20 1301    Visit Number 21    Number of Visits 52    Date for PT Re-Evaluation 08/26/20    Authorization Type [employer]    Authorization Time Period $10 co-pay,    Progress Note Due on Visit 30    PT Start Time 1300    PT Stop Time 1345    PT Time Calculation (min) 45 min    Equipment Utilized During Treatment Gait belt    Activity Tolerance Patient tolerated treatment well    Behavior During Therapy Flat affect;WFL for tasks assessed/performed           Past Medical History:  Diagnosis Date   Allergic rhinitis    Amputation of left lower extremity above knee 08/23/2017   Amputation stump infection 08/23/2017   Atherosclerosis of native arteries of the extremities with gangrene 11/01/2017   2/4-2/15/2020 infected/necrotic third right toe 2/5 transmetatarsal amputation of toes 2-5 with removal of the big toe 2/12 by Dr Myra Gianotti All margins clean; Bactrim prescribed for Serratia osteomyelitis   DM (diabetes mellitus), secondary, uncontrolled, with peripheral vascular complications    Current 11/12/2018 A1c 10.9%   Dyslipidemia    Essential hypertension, benign    Gangrene of right foot 01/17/2019   Hyperkalemia    Hyponatremia 06/10/2017   Insomnia    Leukocytosis    Mixed stress and urge urinary incontinence 06/11/2019   OSA (obstructive sleep apnea)    no cpap   Osteomyelitis of third toe of right foot 11/12/2018   PAD (peripheral artery disease) 07/23/2017   Pain in finger of right hand 02/20/2018   PTSD (post-traumatic stress disorder)    Right below-knee amputee 01/23/2019   Ulnar neuropathy of left upper extremity  10/23/2018    Past Surgical History:  Procedure Laterality Date   ABDOMINAL AORTOGRAM W/LOWER EXTREMITY N/A 06/24/2017   Procedure: ABDOMINAL AORTOGRAM W/LOWER EXTREMITY;  Surgeon: Maeola Harman, MD;  Location: [facility] INVASIVE CV LAB;  Service: Cardiovascular;  Laterality: N/A;   ABDOMINAL AORTOGRAM W/LOWER EXTREMITY Right 10/09/2017   Procedure: ABDOMINAL AORTOGRAM W/LOWER EXTREMITY;  Surgeon: Maeola Harman, MD;  Location: [facility] INVASIVE CV LAB;  Service: Cardiovascular;  Laterality: Right;   AMPUTATION Left 07/23/2017   Procedure: AMPUTATION  BELOW KNEE;  Surgeon: Maeola Harman, MD;  Location: [facility] OR;  Service: Vascular;  Laterality: Left;   AMPUTATION Left 08/12/2017   Procedure: REVISION BELOW KNEE;  Surgeon: Maeola Harman, MD;  Location: [facility] OR;  Service: Vascular;  Laterality: Left;   AMPUTATION Left 08/27/2017   Procedure: left ABOVE KNEE AMPUTATION;  Surgeon: Maeola Harman, MD;  Location: [facility] OR;  Service: Vascular;  Laterality: Left;   AMPUTATION Left 08/30/2017   Procedure: REVISION AMPUTATION ABOVE KNEE;  Surgeon: Nada Libman, MD;  Location: [facility] OR;  Service: Vascular;  Laterality: Left;   AMPUTATION Right 11/04/2017   Procedure: AMPUTATION RIGHT FOURTH TOE;  Surgeon: Maeola Harman, MD;  Location: [facility] OR;  Service: Vascular;  Laterality: Right;   AMPUTATION Right 11/12/2018   Procedure: AMPUTATION OF second, third and fifth toes;  Surgeon: Nada Libman, MD;  Location: [facility] OR;  Service: Vascular;  Laterality: Right;  AMPUTATION Right 01/19/2019   Procedure: AMPUTATION BELOW KNEE;  Surgeon: Sherren KernsFields, Charles E, MD;  [facility];  Service: Vascular;  Laterality: Right;   APPLICATION OF WOUND VAC Left 08/27/2017   Procedure: APPLICATION OF WOUND VAC;  Surgeon: Maeola Harmanain, Brandon Christopher, MD;  [facility];  Service: Vascular;  Laterality: Left;   CARPAL TUNNEL RELEASE Right    COLONOSCOPY     LOWER  EXTREMITY ANGIOGRAM Right 11/12/2018   Procedure: Rosalin HawkingANGIOGRAM OF Right LEG;  Surgeon: Nada LibmanBrabham, Vance W, MD;  [facility];  Service: Vascular;  Laterality: Right;   LOWER EXTREMITY INTERVENTION Right 10/09/2017   Procedure: LOWER EXTREMITY INTERVENTION;  Surgeon: Maeola Harmanain, Brandon Christopher, MD;  [facility];  Service: Cardiovascular;  Laterality: Right;   TOE AMPUTATION Right 11/12/2018   2ND 3RD & 5TH TOE    TRANSMETATARSAL AMPUTATION Right 11/19/2018   Procedure: TRANSMETATARSAL AMPUTATION REVISION;  Surgeon: Nada LibmanBrabham, Vance W, MD;  Location: [facility] OR;  Service: Vascular;  Laterality: Right;    There were no vitals filed for this visit.   Subjective Assessment - 08/17/20 1302    Subjective Her caregiver & she have stood pulling rail on porch with BKA prosthesis only.  She is not able to get out of recliner with prosthesis on limb yet.    Patient Stated Goals Walk with 2 prostheses in home mostly and maybe limited community.    Currently in Pain? Yes    Pain Location Hand    Pain Orientation Right;Left    Pain Descriptors / Indicators Aching;Pins and needles    Pain Type Chronic pain;Neuropathic pain    Pain Onset More than a month ago    Pain Frequency Constant    Aggravating Factors  using hands    Pain Relieving Factors meds & rest                             [facility] Adult PT Treatment/Exercise - 08/17/20 1300      Transfers   Transfers Sit to Stand;Stand to Dollar GeneralSit;Stand Pivot Transfers    Sit to Stand 3: Mod assist;With upper extremity assist;With armrests;From chair/3-in-1;Other (comment)   to //bars & to RW   Sit to Stand Details Tactile cues for weight shifting;Visual cues for safe use of DME/AE;Verbal cues for sequencing;Verbal cues for technique;Verbal cues for safe use of DME/AE;Manual facilitation for weight bearing;Manual facilitation for weight shifting    Sit to Stand Details (indicate cue type and reason) pt stood with caregiver assist with PT  supervision / verbal cues w/c to RW 2 reps    Stand to Sit 4: Min assist;With upper extremity assist;With armrests;To chair/3-in-1;Other (comment)   from //bars & from RW   Stand to Sit Details (indicate cue type and reason) Tactile cues for weight shifting;Visual cues for safe use of DME/AE;Verbal cues for technique;Verbal cues for safe use of DME/AE;Manual facilitation for weight shifting      Ambulation/Gait   Ambulation/Gait Yes    Ambulation/Gait Assistance 3: Mod assist    Ambulation/Gait Assistance Details verbal cues on sequence including UE movement    Ambulation Distance (Feet) 8 Feet   8' // bars & 5' X 2 RW   Assistive device Parallel bars;Prostheses;Rolling walker    Ambulation Surface Level;Indoor      Prosthetics   Prosthetic Care Comments  pt & caregiver to take video of recliner to w/c transfer & pic of pt sitting in recliner with TTA  prosthesis     Current prosthetic wear tolerance (days/week)  daily (TTA prosthesis & TFA liner only)    Current prosthetic wear tolerance (#hours/day)  3-5hrs 2-3x/day.  PT recommended wearing liner when napping in recliner and donning prosthesis when getting out of recliner.  Need to remove liner & wear shrinker for 4-6hours /day to enable skin to dry.     Current prosthetic weight-bearing tolerance (hours/day)  No pain or discomfort with standing in //bars.     Edema pitting edema TTA limb    Residual limb condition  right TTA has medium red rash over distal limb but no signs of infection.  bulbous edematous shape.  no open areas.    TFA no issues noted.     Education Provided Skin check;Residual limb care;Proper Donning;Proper wear schedule/adjustment;Other (comment)    Person(s) Educated Patient;Caregiver(s)    Education Method Explanation;Verbal cues    Education Method Verbalized understanding;Verbal cues required;Needs further instruction                    PT Short Term Goals - 08/17/20 1411      PT SHORT TERM GOAL #1    Title Patient reports able to donne & doffe TTA prosthesis without assistance at home.    Time 1    Period Months    Status Revised    Target Date 08/26/20      PT SHORT TERM GOAL #2   Title Patient tolerates wear of TTA prosthesis >12hrs /day without issues.    Time 1    Period Months    Status Revised    Target Date 08/26/20      PT SHORT TERM GOAL #3   Title Sit to stand w/c to RW with moderate assist with  nephew assist    Time 1    Period Months    Status Revised    Target Date 08/26/20      PT SHORT TERM GOAL #4   Title Stand pivot transfer with RW & bilateral prostheses with moderate assist.    Time 1    Period Months    Status On-going    Target Date 08/26/20      PT SHORT TERM GOAL #5   Title Patient ambulates 10' with RW & bilateral prostheses with maxA.    Time 1    Period Months    Status On-going    Target Date 08/26/20             PT Long Term Goals - 06/29/20 1613      PT LONG TERM GOAL #1   Title Patient tolerates wear of Transtibial prosthesis and Transfemoral liner  >80% of awake hours and Transfemoral prosthesis >5hours total / day without skin or limb pain issues.  (All Target Date 11/25/2020)    Time 6    Period Months    Status On-going      PT LONG TERM GOAL #2   Title Patient verbalizes and demonstrates understading of proper prosthetic care including donning both prostheses.    Time 6    Period Months    Status On-going      PT LONG TERM GOAL #3   Title Patient's standing balance with RW & bilateral prostheses modified independent: sit to/from stand chairs with armrests, reaching 5" anteriorly & knee level and managing clothes.    Time 6    Period Months    Status On-going      PT LONG TERM GOAL #4  Title Patient ambulates 93' around furniture with RW & bilateral prostheses modified independent.    Time 6    Period Months    Status On-going                 Plan - 08/17/20 1302    Clinical Impression Statement Patient  was able to stand with nephew's assist to RW for first time today.  She is progressing towards STGs    Personal Factors and Comorbidities Comorbidity 3+;Education;Fitness;Past/Current Experience;Social Background;Time since onset of injury/illness/exacerbation    Comorbidities Rt TTA, Lt TFA, DM, HTN, PVD, depression, right Carpal Tunnel Release surgery    Examination-Activity Limitations Locomotion Level;Squat;Stand;Transfers;Stairs    Examination-Participation Restrictions Community Activity    Stability/Clinical Decision Making Evolving/Moderate complexity    Rehab Potential Good    PT Frequency 2x / week    PT Duration Other (comment)   6 months (26 weeks)   PT Treatment/Interventions ADLs/Self Care Home Management;DME Instruction;Gait training;Stair training;Functional mobility training;Therapeutic activities;Therapeutic exercise;Balance training;Neuromuscular re-education;Patient/family education;Prosthetic Training;Manual techniques;Scar mobilization;Passive range of motion;Vasopneumatic Device;Vestibular    PT Next Visit Plan standing & gait with RW    Consulted and Agree with Plan of Care Patient;Family member/caregiver    Family Member Consulted nephew / caregiver Marikay Alar           Patient will benefit from skilled therapeutic intervention in order to improve the following deficits and impairments:  Abnormal gait, Cardiopulmonary status limiting activity, Decreased activity tolerance, Decreased balance, Decreased endurance, Decreased knowledge of use of DME, Decreased mobility, Decreased range of motion, Decreased skin integrity, Decreased scar mobility, Decreased strength, Dizziness, Increased edema, Impaired flexibility, Postural dysfunction, Prosthetic Dependency, Obesity, Pain  Visit Diagnosis: Unsteadiness on feet  Muscle weakness (generalized)  Other abnormalities of gait and mobility  Abnormal posture  Pain in right hand  Stiffness of left hip, not elsewhere  classified     Problem List Patient Active Problem List   Diagnosis Date Noted   PTSD (post-traumatic stress disorder)    Mixed stress and urge urinary incontinence 06/11/2019   Right below-knee amputee 01/23/2019   Ulnar neuropathy of left upper extremity 10/23/2018   Pain in finger of right hand 02/20/2018   Atherosclerosis of native arteries of the extremities with gangrene 11/01/2017   OSA (obstructive sleep apnea)    Benign essential HTN    DM (diabetes mellitus), secondary, uncontrolled, with peripheral vascular complications    Hyperkalemia    Leukocytosis    Acute blood loss anemia    Amputation of left lower extremity above knee 08/23/2017   PAD (peripheral artery disease) 07/23/2017   Cellulitis 06/10/2017   Hyponatremia 06/10/2017   Fever 06/10/2017   Rash 05/24/2017   Allergic rhinitis    Insomnia     Vladimir Faster PT, DPT 08/17/2020, 2:18 PM  [facility] Physical Therapy [address] [place], [place], [address] Phone: [phone]   Fax:  [phone]  Name: Danielle Buchanan MRN: [id number] Date of Birth: [birth year]

## 2020-08-22 ENCOUNTER — Other Ambulatory Visit: Payer: Self-pay

## 2020-08-22 ENCOUNTER — Ambulatory Visit (INDEPENDENT_AMBULATORY_CARE_PROVIDER_SITE_OTHER): Payer: Medicare HMO | Admitting: Physical Therapy

## 2020-08-22 ENCOUNTER — Encounter: Payer: Self-pay | Admitting: Physical Therapy

## 2020-08-22 DIAGNOSIS — R2689 Other abnormalities of gait and mobility: Secondary | ICD-10-CM | POA: Diagnosis not present

## 2020-08-22 DIAGNOSIS — M25652 Stiffness of left hip, not elsewhere classified: Secondary | ICD-10-CM

## 2020-08-22 DIAGNOSIS — M6281 Muscle weakness (generalized): Secondary | ICD-10-CM

## 2020-08-22 DIAGNOSIS — R2681 Unsteadiness on feet: Secondary | ICD-10-CM | POA: Diagnosis not present

## 2020-08-22 DIAGNOSIS — R293 Abnormal posture: Secondary | ICD-10-CM

## 2020-08-22 DIAGNOSIS — M79641 Pain in right hand: Secondary | ICD-10-CM | POA: Diagnosis not present

## 2020-08-22 NOTE — Therapy (Signed)
[facility] Physical Therapy [address] [place], [place], [address] Phone: [phone]   Fax:  [phone]  Physical Therapy Treatment  Patient Details  Name: CHAUNA OSORIA MRN: [id number] Date of Birth: 1956-06-16 Referring Provider (PT): [name], MD   Encounter Date: 08/22/2020   PT End of Session - 08/22/20 1257    Visit Number 22    Number of Visits 52    Date for PT Re-Evaluation 08/26/20    Authorization Type [employer]    Authorization Time Period $10 co-pay,    Progress Note Due on Visit 30    PT Start Time 1259    PT Stop Time 1345    PT Time Calculation (min) 46 min    Equipment Utilized During Treatment Gait belt    Activity Tolerance Patient tolerated treatment well    Behavior During Therapy Flat affect;WFL for tasks assessed/performed           Past Medical History:  Diagnosis Date  . Allergic rhinitis   . Amputation of left lower extremity above knee 08/23/2017  . Amputation stump infection 08/23/2017  . Atherosclerosis of native arteries of the extremities with gangrene 11/01/2017   2/4-2/15/2020 infected/necrotic third right toe 2/5 transmetatarsal amputation of toes 2-5 with removal of the big toe 2/12 by Dr Myra Gianotti All margins clean; Bactrim prescribed for Serratia osteomyelitis  . DM (diabetes mellitus), secondary, uncontrolled, with peripheral vascular complications    Current 11/12/2018 A1c 10.9%  . Dyslipidemia   . Essential hypertension, benign   . Gangrene of right foot 01/17/2019  . Hyperkalemia   . Hyponatremia 06/10/2017  . Insomnia   . Leukocytosis   . Mixed stress and urge urinary incontinence 06/11/2019  . OSA (obstructive sleep apnea)    no cpap  . Osteomyelitis of third toe of right foot 11/12/2018  . PAD (peripheral artery disease) 07/23/2017  . Pain in finger of right hand 02/20/2018  . PTSD (post-traumatic stress disorder)   . Right below-knee amputee 01/23/2019  . Ulnar neuropathy of left upper extremity  10/23/2018    Past Surgical History:  Procedure Laterality Date  . ABDOMINAL AORTOGRAM W/LOWER EXTREMITY N/A 06/24/2017   Procedure: ABDOMINAL AORTOGRAM W/LOWER EXTREMITY;  Surgeon: Maeola Harman, MD;  Location: [facility] INVASIVE CV LAB;  Service: Cardiovascular;  Laterality: N/A;  . ABDOMINAL AORTOGRAM W/LOWER EXTREMITY Right 10/09/2017   Procedure: ABDOMINAL AORTOGRAM W/LOWER EXTREMITY;  Surgeon: Maeola Harman, MD;  Location: [facility] INVASIVE CV LAB;  Service: Cardiovascular;  Laterality: Right;  . AMPUTATION Left 07/23/2017   Procedure: AMPUTATION  BELOW KNEE;  Surgeon: Maeola Harman, MD;  Location: [facility] OR;  Service: Vascular;  Laterality: Left;  . AMPUTATION Left 08/12/2017   Procedure: REVISION BELOW KNEE;  Surgeon: Maeola Harman, MD;  Location: [facility] OR;  Service: Vascular;  Laterality: Left;  . AMPUTATION Left 08/27/2017   Procedure: left ABOVE KNEE AMPUTATION;  Surgeon: Maeola Harman, MD;  Location: [facility] OR;  Service: Vascular;  Laterality: Left;  . AMPUTATION Left 08/30/2017   Procedure: REVISION AMPUTATION ABOVE KNEE;  Surgeon: Nada Libman, MD;  Location: [facility] OR;  Service: Vascular;  Laterality: Left;  . AMPUTATION Right 11/04/2017   Procedure: AMPUTATION RIGHT FOURTH TOE;  Surgeon: Maeola Harman, MD;  Location: [facility] OR;  Service: Vascular;  Laterality: Right;  . AMPUTATION Right 11/12/2018   Procedure: AMPUTATION OF second, third and fifth toes;  Surgeon: Nada Libman, MD;  Location: [facility] OR;  Service: Vascular;  Laterality: Right;  .  AMPUTATION Right 01/19/2019   Procedure: AMPUTATION BELOW KNEE;  Surgeon: Sherren Kerns, MD;  Location: [facility] OR;  Service: Vascular;  Laterality: Right;  . APPLICATION OF WOUND VAC Left 08/27/2017   Procedure: APPLICATION OF WOUND VAC;  Surgeon: Maeola Harman, MD;  Location: [facility] OR;  Service: Vascular;  Laterality: Left;  . CARPAL TUNNEL RELEASE Right   . COLONOSCOPY    . LOWER  EXTREMITY ANGIOGRAM Right 11/12/2018   Procedure: ANGIOGRAM OF Right LEG;  Surgeon: Nada Libman, MD;  Location: [facility] OR;  Service: Vascular;  Laterality: Right;  . LOWER EXTREMITY INTERVENTION Right 10/09/2017   Procedure: LOWER EXTREMITY INTERVENTION;  Surgeon: Maeola Harman, MD;  Location: [facility] INVASIVE CV LAB;  Service: Cardiovascular;  Laterality: Right;  . TOE AMPUTATION Right 11/12/2018   2ND 3RD & 5TH TOE   . TRANSMETATARSAL AMPUTATION Right 11/19/2018   Procedure: TRANSMETATARSAL AMPUTATION REVISION;  Surgeon: Nada Libman, MD;  Location: [facility] OR;  Service: Vascular;  Laterality: Right;    There were no vitals filed for this visit.   Subjective Assessment - 08/22/20 1258    Patient Stated Goals Walk with 2 prostheses in home mostly and maybe limited community.    Pain Onset More than a month ago                             [facility] Adult PT Treatment/Exercise - 08/22/20 1259      Transfers   Transfers Sit to Stand;Stand to [employer] Transfers    Sit to Stand 3: Mod assist;With upper extremity assist;With armrests;From chair/3-in-1;Other (comment)   to RW with bil. prostheses, nephew able to assist   Sit to Stand Details Tactile cues for weight shifting;Visual cues for safe use of DME/AE;Verbal cues for sequencing;Verbal cues for technique;Verbal cues for safe use of DME/AE;Manual facilitation for weight bearing;Manual facilitation for weight shifting    Stand to Sit 4: Min assist;With upper extremity assist;With armrests;To chair/3-in-1;Other (comment)   from RW with bil. prostheses, nephew able to assist   Stand to Sit Details (indicate cue type and reason) Tactile cues for weight shifting;Visual cues for safe use of DME/AE;Verbal cues for technique;Verbal cues for safe use of DME/AE;Manual facilitation for weight shifting    Lateral/Scoot Transfers 5: Supervision    Lateral/Scoot Transfer Details (indicate cue type and reason) [name] brought  video of transfer at home from recliner to w/c.  She is doing head-on set up with 180* turning.  PT recommended 90* set up to left using Right TTA prosthesis.  Pt & nephew verbalized & demo understanding with plan to practice at home.        Ambulation/Gait   Ambulation/Gait Yes    Ambulation/Gait Assistance 3: Mod assist    Ambulation/Gait Assistance Details verbal & tactile/manual cues on right knee ext in stance, upright posture & wt shift.      Ambulation Distance (Feet) 10 Feet    Assistive device Prostheses;Rolling walker    Ambulation Surface Level;Indoor      Prosthetics   Prosthetic Care Comments  --    Current prosthetic wear tolerance (days/week)  daily (TTA prosthesis & TFA liner only)    Current prosthetic wear tolerance (#hours/day)  3-5hrs 2-3x/day.  PT recommended wearing liner when napping in recliner and donning prosthesis when getting out of recliner.  Need to remove liner & wear shrinker for 4-6hours /day to enable skin to dry.  Current prosthetic weight-bearing tolerance (hours/day)  No pain or discomfort with standing in //bars.     Edema pitting edema TTA limb    Residual limb condition  right TTA has medium red rash over distal limb but no signs of infection.  bulbous edematous shape.  no open areas.    TFA no issues noted.     Education Provided Skin check;Residual limb care;Proper Donning;Proper wear schedule/adjustment;Other (comment)    Person(s) Educated Patient;Caregiver(s)    Education Method Explanation;Verbal cues    Education Method Verbalized understanding;Verbal cues required;Needs further instruction                    PT Short Term Goals - 08/22/20 1522      PT SHORT TERM GOAL #1   Title Patient reports able to donne & doffe TTA prosthesis without assistance at home.    Time 1    Period Months    Status On-going    [employer] Date 08/26/20      PT SHORT TERM GOAL #2   Title Patient tolerates wear of TTA prosthesis >12hrs /day without  issues.    Time 1    Period Months    Status On-going    Target Date 08/26/20      PT SHORT TERM GOAL #3   Title Sit to stand w/c to RW with moderate assist with  nephew assist    Time 1    Period Months    Status On-going    Target Date 08/26/20      PT SHORT TERM GOAL #4   Title Stand pivot transfer with RW & bilateral prostheses with moderate assist.    Time 1    Period Months    Status On-going    Target Date 08/26/20      PT SHORT TERM GOAL #5   Title Patient ambulates 10' with RW & bilateral prostheses with maxA.    Time 1    Period Months    Status On-going    Target Date 08/26/20             PT Long Term Goals - 06/29/20 1613      PT LONG TERM GOAL #1   Title Patient tolerates wear of Transtibial prosthesis and Transfemoral liner  >80% of awake hours and Transfemoral prosthesis >5hours total / day without skin or limb pain issues.  (All [employer] Date 11/25/2020)    Time 6    Period Months    Status On-going      PT LONG TERM GOAL #2   Title Patient verbalizes and demonstrates understading of proper prosthetic care including donning both prostheses.    Time 6    Period Months    Status On-going      PT LONG TERM GOAL #3   Title Patient's standing balance with RW & bilateral prostheses modified independent: sit to/from stand chairs with armrests, reaching 5" anteriorly & knee level and managing clothes.    Time 6    Period Months    Status On-going      PT LONG TERM GOAL #4   Title Patient ambulates 72' around furniture with RW & bilateral prostheses modified independent.    Time 6    Period Months    Status On-going                 Plan - 08/22/20 1258    Clinical Impression Statement Patient's ability to stand with nephew improved.  PT worked  on transfers from chair with armrests (like her recliner) to/from w/c with armrests removed using TTA prosthesis.    Personal Factors and Comorbidities Comorbidity 3+;Education;Fitness;Past/Current  Experience;Social Background;Time since onset of injury/illness/exacerbation    Comorbidities Rt TTA, Lt TFA, DM, HTN, PVD, depression, right Carpal Tunnel Release surgery    Examination-Activity Limitations Locomotion Level;Squat;Stand;Transfers;Stairs    Examination-Participation Restrictions Community Activity    Stability/Clinical Decision Making Evolving/Moderate complexity    Rehab Potential Good    PT Frequency 2x / week    PT Duration Other (comment)   6 months (26 weeks)   PT Treatment/Interventions ADLs/Self Care Home Management;DME Instruction;Gait training;Stair training;Functional mobility training;Therapeutic activities;Therapeutic exercise;Balance training;Neuromuscular re-education;Patient/family education;Prosthetic Training;Manual techniques;Scar mobilization;Passive range of motion;Vasopneumatic Device;Vestibular    PT Next Visit Plan standing & gait with RW    Consulted and Agree with Plan of Care Patient;Family member/caregiver    Family Member Consulted nephew / caregiver Marikay AlarGreg Richardson           Patient will benefit from skilled therapeutic intervention in order to improve the following deficits and impairments:  Abnormal gait, Cardiopulmonary status limiting activity, Decreased activity tolerance, Decreased balance, Decreased endurance, Decreased knowledge of use of DME, Decreased mobility, Decreased range of motion, Decreased skin integrity, Decreased scar mobility, Decreased strength, Dizziness, Increased edema, Impaired flexibility, Postural dysfunction, Prosthetic Dependency, Obesity, Pain  Visit Diagnosis: Unsteadiness on feet  Muscle weakness (generalized)  Other abnormalities of gait and mobility  Abnormal posture  Pain in right hand  Stiffness of left hip, not elsewhere classified     Problem List Patient Active Problem List   Diagnosis Date Noted  . PTSD (post-traumatic stress disorder)   . Mixed stress and urge urinary incontinence 06/11/2019    . Right below-knee amputee 01/23/2019  . Ulnar neuropathy of left upper extremity 10/23/2018  . Pain in finger of right hand 02/20/2018  . Atherosclerosis of native arteries of the extremities with gangrene 11/01/2017  . OSA (obstructive sleep apnea)   . Benign essential HTN   . DM (diabetes mellitus), secondary, uncontrolled, with peripheral vascular complications   . Hyperkalemia   . Leukocytosis   . Acute blood loss anemia   . Amputation of left lower extremity above knee 08/23/2017  . PAD (peripheral artery disease) 07/23/2017  . Cellulitis 06/10/2017  . Hyponatremia 06/10/2017  . Fever 06/10/2017  . Rash 05/24/2017  . Allergic rhinitis   . Insomnia     Vladimir Fasterobin Paislie Tessler PT, DPT 08/22/2020, 3:25 PM  [facility] [address] [place], [place], [address] Phone: [phone]   Fax:  [phone]  Name: Aldine ContesJanice M Johanning MRN: [id number] Date of Birth: 11-19-1955

## 2020-08-24 ENCOUNTER — Ambulatory Visit (INDEPENDENT_AMBULATORY_CARE_PROVIDER_SITE_OTHER): Payer: Medicare HMO | Admitting: Physical Therapy

## 2020-08-24 ENCOUNTER — Encounter: Payer: Self-pay | Admitting: Physical Therapy

## 2020-08-24 ENCOUNTER — Other Ambulatory Visit: Payer: Self-pay

## 2020-08-24 DIAGNOSIS — M6281 Muscle weakness (generalized): Secondary | ICD-10-CM

## 2020-08-24 DIAGNOSIS — M25652 Stiffness of left hip, not elsewhere classified: Secondary | ICD-10-CM

## 2020-08-24 DIAGNOSIS — R293 Abnormal posture: Secondary | ICD-10-CM

## 2020-08-24 DIAGNOSIS — R2681 Unsteadiness on feet: Secondary | ICD-10-CM

## 2020-08-24 DIAGNOSIS — R2689 Other abnormalities of gait and mobility: Secondary | ICD-10-CM

## 2020-08-24 DIAGNOSIS — M79641 Pain in right hand: Secondary | ICD-10-CM

## 2020-08-24 NOTE — Therapy (Signed)
[facility] [address] [place], [place], [address] Phone: [phone]   Fax:  [phone]  Physical Therapy Treatment, Recertification & 76LY Visit Progress Note  Patient Details  Name: Danielle Buchanan MRN: [id number] Date of Birth: 1956/03/03 Referring Provider (PT): Maurice Small, MD   Encounter Date: 08/24/2020   Progress Note Reporting Period 08/17/2020 to 08/24/2020  See note below for Objective Data and Assessment of Progress/Goals.        PT End of Session - 08/24/20 1515    Visit Number 23    Number of Visits 52    Date for PT Re-Evaluation 11/18/20    Authorization Type [employer]    Authorization Time Period $10 co-pay,    Progress Note Due on Visit 33    PT Start Time 1515    PT Stop Time 1558    PT Time Calculation (min) 43 min    Equipment Utilized During Treatment Gait belt    Activity Tolerance Patient tolerated treatment well    Behavior During Therapy Flat affect;WFL for tasks assessed/performed           Past Medical History:  Diagnosis Date  . Allergic rhinitis   . Amputation of left lower extremity above knee 08/23/2017  . Amputation stump infection 08/23/2017  . Atherosclerosis of native arteries of the extremities with gangrene 11/01/2017   2/4-2/15/2020 infected/necrotic third right toe 2/5 transmetatarsal amputation of toes 2-5 with removal of the big toe 2/12 by Dr Trula Slade All margins clean; Bactrim prescribed for Serratia osteomyelitis  . DM (diabetes mellitus), secondary, uncontrolled, with peripheral vascular complications    Current 11/12/2018 A1c 10.9%  . Dyslipidemia   . Essential hypertension, benign   . Gangrene of right foot 01/17/2019  . Hyperkalemia   . Hyponatremia 06/10/2017  . Insomnia   . Leukocytosis   . Mixed stress and urge urinary incontinence 06/11/2019  . OSA (obstructive sleep apnea)    no cpap  . Osteomyelitis of third toe of right foot 11/12/2018  . PAD (peripheral artery  disease) 07/23/2017  . Pain in finger of right hand 02/20/2018  . PTSD (post-traumatic stress disorder)   . Right below-knee amputee 01/23/2019  . Ulnar neuropathy of left upper extremity 10/23/2018    Past Surgical History:  Procedure Laterality Date  . ABDOMINAL AORTOGRAM W/LOWER EXTREMITY N/A 06/24/2017   Procedure: ABDOMINAL AORTOGRAM W/LOWER EXTREMITY;  Surgeon: Waynetta Sandy, MD;  Location: [place] [facility];  Service: Cardiovascular;  Laterality: N/A;  . ABDOMINAL AORTOGRAM W/LOWER EXTREMITY Right 10/09/2017   Procedure: ABDOMINAL AORTOGRAM W/LOWER EXTREMITY;  Surgeon: Waynetta Sandy, MD;  Location: [place] [facility];  Service: Cardiovascular;  Laterality: Right;  . AMPUTATION Left 07/23/2017   Procedure: AMPUTATION  BELOW KNEE;  Surgeon: Waynetta Sandy, MD;  Location: [place];  Service: Vascular;  Laterality: Left;  . AMPUTATION Left 08/12/2017   Procedure: REVISION BELOW KNEE;  Surgeon: Waynetta Sandy, MD;  Location: [place];  Service: Vascular;  Laterality: Left;  . AMPUTATION Left 08/27/2017   Procedure: left ABOVE KNEE AMPUTATION;  Surgeon: Waynetta Sandy, MD;  Location: [place];  Service: Vascular;  Laterality: Left;  . AMPUTATION Left 08/30/2017   Procedure: REVISION AMPUTATION ABOVE KNEE;  Surgeon: Serafina Mitchell, MD;  Location: [place];  Service: Vascular;  Laterality: Left;  . AMPUTATION Right 11/04/2017   Procedure: AMPUTATION RIGHT FOURTH TOE;  Surgeon: Waynetta Sandy, MD;  Location: [place];  Service: Vascular;  Laterality: Right;  .  AMPUTATION Right 11/12/2018   Procedure: AMPUTATION OF second, third and fifth toes;  Surgeon: Serafina Mitchell, MD;  Location: [place];  Service: Vascular;  Laterality: Right;  . AMPUTATION Right 01/19/2019   Procedure: AMPUTATION BELOW KNEE;  Surgeon: Elam Dutch, MD;  Location: [place];  Service: Vascular;  Laterality: Right;  . APPLICATION OF WOUND VAC Left 08/27/2017   Procedure:  APPLICATION OF WOUND VAC;  Surgeon: Waynetta Sandy, MD;  Location: [place];  Service: Vascular;  Laterality: Left;  . CARPAL TUNNEL RELEASE Right   . COLONOSCOPY    . LOWER EXTREMITY ANGIOGRAM Right 11/12/2018   Procedure: ANGIOGRAM OF Right LEG;  Surgeon: Serafina Mitchell, MD;  Location: [place];  Service: Vascular;  Laterality: Right;  . LOWER EXTREMITY INTERVENTION Right 10/09/2017   Procedure: LOWER EXTREMITY INTERVENTION;  Surgeon: Waynetta Sandy, MD;  Location: [place] [facility];  Service: Cardiovascular;  Laterality: Right;  . TOE AMPUTATION Right 11/12/2018   2ND 3RD & 5TH TOE   . TRANSMETATARSAL AMPUTATION Right 11/19/2018   Procedure: TRANSMETATARSAL AMPUTATION REVISION;  Surgeon: Serafina Mitchell, MD;  Location: [place];  Service: Vascular;  Laterality: Right;    There were no vitals filed for this visit.   Subjective Assessment - 08/24/20 1521    Subjective She stood with nephew to walker with both prostheses.  He was able to release her for short periods. Danielle Buchanan reports she feels that she is getting better and would like to continue PT.    Patient Stated Goals Walk with 2 prostheses in home mostly and maybe limited community.    Currently in Pain? Yes    Pain Score 5     Pain Location Hand    Pain Orientation Right;Left;Lateral    Pain Descriptors / Indicators Pins and needles;Tingling    Pain Type Chronic pain    Pain Onset More than a month ago    Aggravating Factors  neuropathy    Pain Relieving Factors rest, medication                             [facility] Adult PT Treatment/Exercise - 08/24/20 1515      Transfers   Transfers Sit to Stand;Stand to [employer] Transfers    Sit to Stand 3: Mod assist;With upper extremity assist;With armrests;From chair/3-in-1;Other (comment)   to RW with bil. prostheses, nephew able to assist   Sit to Stand Details Tactile cues for weight shifting;Visual cues for safe use of DME/AE;Verbal cues for  sequencing;Verbal cues for technique;Verbal cues for safe use of DME/AE;Manual facilitation for weight bearing;Manual facilitation for weight shifting    Stand to Sit 4: Min assist;With upper extremity assist;With armrests;To chair/3-in-1;Other (comment)   from RW with bil. prostheses, nephew able to assist   Stand to Sit Details (indicate cue type and reason) Tactile cues for weight shifting;Visual cues for safe use of DME/AE;Verbal cues for technique;Verbal cues for safe use of DME/AE;Manual facilitation for weight shifting    Stand Pivot Transfers 3: Mod assist   RW & bil. prostheses   Stand Pivot Transfer Details (indicate cue type and reason) PT demo & verbal cues on technique including prosthetic feet under pelvis and stepping feet for turns not swivelling    Lateral/Scoot Transfers --      Ambulation/Gait   Ambulation/Gait Yes    Ambulation/Gait Assistance 2: Max assist;3: Mod assist    Ambulation/Gait Assistance Details Manual assist for  upright, verbal, tactile & manual cues on step length & wt shift without feet under RW bar.      Ambulation Distance (Feet) 14 Feet   max tolerable distance   Assistive device Prostheses;Rolling walker    Ambulation Surface Level;Indoor      Neuro Re-ed    Neuro Re-ed Details  standing in //bars working on stepping feet in rotation / turn right & left.        Prosthetics   Current prosthetic wear tolerance (days/week)  daily (TTA prosthesis & TFA liner only)    Current prosthetic wear tolerance (#hours/day)  3-5hrs 2-3x/day.  PT recommended wearing liner when napping in recliner and donning prosthesis when getting out of recliner.  Need to remove liner & wear shrinker for 4-6hours /day to enable skin to dry.     Current prosthetic weight-bearing tolerance (hours/day)  No pain or discomfort with standing in //bars.     Edema pitting edema TTA limb    Residual limb condition  right TTA has medium red rash over distal limb but no signs of infection.   bulbous edematous shape.  no open areas.    TFA no issues noted.     Education Provided Proper Donning;Proper wear schedule/adjustment    Person(s) Educated Patient;Caregiver(s)    Education Method Explanation;Verbal cues    Education Method Verbalized understanding;Verbal cues required;Needs further instruction                    PT Short Term Goals - 08/24/20 1628      PT SHORT TERM GOAL #1   Title Patient reports able to donne & doffe TTA prosthesis without assistance at home.    Baseline Met per patient report    Time 1    Period Months    Status Achieved    Target Date 08/26/20      PT SHORT TERM GOAL #2   Title Patient tolerates wear of TTA prosthesis >12hrs /day without issues.    Baseline MET    Time 1    Period Months    Status Achieved    Target Date 08/26/20      PT SHORT TERM GOAL #3   Title Sit to stand w/c to RW with moderate assist with  nephew assist    Baseline MET 08/24/2020    Time 1    Period Months    Status Achieved    Target Date 08/26/20      PT SHORT TERM GOAL #4   Title Stand pivot transfer with RW & bilateral prostheses with moderate assist.    Baseline Partially MET 08/24/2020   she is inconsistent in amount of assistance.    Time 1    Period Months    Status Partially Met    Target Date 08/26/20      PT SHORT TERM GOAL #5   Title Patient ambulates 10' with RW & bilateral prostheses with maxA.    Baseline MET 08/24/2020    Time 1    Period Months    Status Achieved    Target Date 08/26/20             PT Short Term Goals - 08/24/20 1645      PT SHORT TERM GOAL #1   Title Patient reports ability to transfer from her recliner to w/c with TTA proshesis.    Time 1    Period Months    Status New    Target Date 09/29/20  PT SHORT TERM GOAL #2   Title Stand pivot transfer with RW between chairs with armrests to right or left with modA    Time 1    Period Months    Status On-going    Target Date 09/29/20      PT  SHORT TERM GOAL #3   Title Sit to/form stand w/c to RW with minimal assist    Time 1    Period Months    Status Revised    Target Date 09/29/20      PT SHORT TERM GOAL #4   Title patient ambulates 37' with RW & bilateral prostheses with modA.    Time 1    Period Months    Status Revised    Target Date 09/29/20      PT SHORT TERM GOAL #5   Title --    Baseline --    Time --    Period --    Status --    Target Date --             PT Long Term Goals - 08/24/20 1631      PT LONG TERM GOAL #1   Title Patient tolerates wear of Transtibial prosthesis and Transfemoral liner  >80% of awake hours and Transfemoral prosthesis >5hours total / day without skin or limb pain issues.  (All Target Date 11/24/2020)    Time 3    Period Months    Status On-going    Target Date 11/24/20      PT LONG TERM GOAL #2   Title Patient verbalizes and demonstrates understading of proper prosthetic care including donning both prostheses.    Time 3    Period Months    Status On-going    Target Date 11/24/20      PT LONG TERM GOAL #3   Title sit to / from stand from chairs with armrests to RW and maintains upright 2 minutes with supervision.    Time 3    Period Months    Status Revised    Target Date 11/24/20      PT LONG TERM GOAL #4   Title Patient ambulates 75' with RW & bilateral prostheses with minA from nephew.    Time 3    Period Months    Status Revised    Target Date 11/24/20                 Plan - 08/24/20 1521    Clinical Impression Statement Patient met or partially met STGs set for this 30-day period.  Initial PT assessment anticipated 6 months ( currently 3 months into initial plan) of care to maximize function with bilateral prostheses due to severe deconditioning, obesity, education level, lives alone so limited practice outside PT and level of amputations.  Patient would benefit from an additional 90 days of skilled care to maximize her function & safety.    Personal  Factors and Comorbidities Comorbidity 3+;Education;Fitness;Past/Current Experience;Social Background;Time since onset of injury/illness/exacerbation    Comorbidities Rt TTA, Lt TFA, DM, HTN, PVD, depression, right Carpal Tunnel Release surgery    Examination-Activity Limitations Locomotion Level;Squat;Stand;Transfers;Stairs    Examination-Participation Restrictions Community Activity    Stability/Clinical Decision Making Evolving/Moderate complexity    Rehab Potential Good    PT Frequency 2x / week    PT Duration 12 weeks    PT Treatment/Interventions ADLs/Self Care Home Management;DME Instruction;Gait training;Stair training;Functional mobility training;Therapeutic activities;Therapeutic exercise;Balance training;Neuromuscular re-education;Patient/family education;Prosthetic Training;Manual techniques;Scar mobilization;Passive range of motion;Vasopneumatic Device;Vestibular  PT Next Visit Plan standing & gait with RW    Consulted and Agree with Plan of Care Patient;Family member/caregiver    Family Member Consulted nephew / caregiver Danielle Buchanan           Patient will benefit from skilled therapeutic intervention in order to improve the following deficits and impairments:  Abnormal gait, Cardiopulmonary status limiting activity, Decreased activity tolerance, Decreased balance, Decreased endurance, Decreased knowledge of use of DME, Decreased mobility, Decreased range of motion, Decreased skin integrity, Decreased scar mobility, Decreased strength, Dizziness, Increased edema, Impaired flexibility, Postural dysfunction, Prosthetic Dependency, Obesity, Pain  Visit Diagnosis: Unsteadiness on feet  Muscle weakness (generalized)  Other abnormalities of gait and mobility  Abnormal posture  Pain in right hand  Stiffness of left hip, not elsewhere classified     Problem List Patient Active Problem List   Diagnosis Date Noted  . PTSD (post-traumatic stress disorder)   . Mixed  stress and urge urinary incontinence 06/11/2019  . Right below-knee amputee 01/23/2019  . Ulnar neuropathy of left upper extremity 10/23/2018  . Pain in finger of right hand 02/20/2018  . Atherosclerosis of native arteries of the extremities with gangrene 11/01/2017  . OSA (obstructive sleep apnea)   . Benign essential HTN   . DM (diabetes mellitus), secondary, uncontrolled, with peripheral vascular complications   . Hyperkalemia   . Leukocytosis   . Acute blood loss anemia   . Amputation of left lower extremity above knee 08/23/2017  . PAD (peripheral artery disease) 07/23/2017  . Cellulitis 06/10/2017  . Hyponatremia 06/10/2017  . Fever 06/10/2017  . Rash 05/24/2017  . Allergic rhinitis   . Insomnia     Jamey Reas PT, DPT 08/24/2020, 4:40 PM  [facility] [address] [place], [place], [address] Phone: [phone]   Fax:  [phone]  Name: Danielle Buchanan MRN: [id number] Date of Birth: [birth year]

## 2020-09-06 ENCOUNTER — Encounter: Payer: Self-pay | Admitting: Physical Therapy

## 2020-09-06 ENCOUNTER — Ambulatory Visit (INDEPENDENT_AMBULATORY_CARE_PROVIDER_SITE_OTHER): Payer: Medicare HMO | Admitting: Physical Therapy

## 2020-09-06 ENCOUNTER — Other Ambulatory Visit: Payer: Self-pay

## 2020-09-06 DIAGNOSIS — R293 Abnormal posture: Secondary | ICD-10-CM

## 2020-09-06 DIAGNOSIS — M79641 Pain in right hand: Secondary | ICD-10-CM | POA: Diagnosis not present

## 2020-09-06 DIAGNOSIS — R2681 Unsteadiness on feet: Secondary | ICD-10-CM

## 2020-09-06 DIAGNOSIS — M6281 Muscle weakness (generalized): Secondary | ICD-10-CM | POA: Diagnosis not present

## 2020-09-06 DIAGNOSIS — M25652 Stiffness of left hip, not elsewhere classified: Secondary | ICD-10-CM

## 2020-09-06 DIAGNOSIS — R2689 Other abnormalities of gait and mobility: Secondary | ICD-10-CM

## 2020-09-06 NOTE — Therapy (Signed)
[facility] [address] [place], [place], [id number] Phone: [phone]   Fax:  [phone]  Physical Therapy Treatment  Patient Details  Name: Danielle Buchanan MRN: [id number] Date of Birth: Jul 19, 1956 Referring Provider (PT): Shirlean Mylararol Webb, MD   Encounter Date: 09/06/2020   PT End of Session - 09/06/20 1339    Visit Number 24    Number of Visits 52    Date for PT Re-Evaluation 11/18/20    Authorization Type [employer]    Authorization Time Period $10 co-pay,    Progress Note Due on Visit 33    PT Start Time 1344    PT Stop Time 1430    PT Time Calculation (min) 46 min    Equipment Utilized During Treatment Gait belt    Activity Tolerance Patient tolerated treatment well    Behavior During Therapy Flat affect;WFL for tasks assessed/performed           Past Medical History:  Diagnosis Date  . Allergic rhinitis   . Amputation of left lower extremity above knee 08/23/2017  . Amputation stump infection 08/23/2017  . Atherosclerosis of native arteries of the extremities with gangrene 11/01/2017   2/4-2/15/2020 infected/necrotic third right toe 2/5 transmetatarsal amputation of toes 2-5 with removal of the big toe 2/12 by Dr Myra GianottiBrabham All margins clean; Bactrim prescribed for Serratia osteomyelitis  . DM (diabetes mellitus), secondary, uncontrolled, with peripheral vascular complications    Current 11/12/2018 A1c 10.9%  . Dyslipidemia   . Essential hypertension, benign   . Gangrene of right foot 01/17/2019  . Hyperkalemia   . Hyponatremia 06/10/2017  . Insomnia   . Leukocytosis   . Mixed stress and urge urinary incontinence 06/11/2019  . OSA (obstructive sleep apnea)    no cpap  . Osteomyelitis of third toe of right foot 11/12/2018  . PAD (peripheral artery disease) 07/23/2017  . Pain in finger of right hand 02/20/2018  . PTSD (post-traumatic stress disorder)   . Right below-knee amputee 01/23/2019  . Ulnar neuropathy of left upper extremity  10/23/2018    Past Surgical History:  Procedure Laterality Date  . ABDOMINAL AORTOGRAM W/LOWER EXTREMITY N/A 06/24/2017   Procedure: ABDOMINAL AORTOGRAM W/LOWER EXTREMITY;  Surgeon: Maeola Harmanain, Brandon Christopher, MD;  [facility];  Service: Cardiovascular;  Laterality: N/A;  . ABDOMINAL AORTOGRAM W/LOWER EXTREMITY Right 10/09/2017   Procedure: ABDOMINAL AORTOGRAM W/LOWER EXTREMITY;  Surgeon: Maeola Harmanain, Brandon Christopher, MD;  [facility];  Service: Cardiovascular;  Laterality: Right;  . AMPUTATION Left 07/23/2017   Procedure: AMPUTATION  BELOW KNEE;  Surgeon: Maeola Harmanain, Brandon Christopher, MD;  [facility];  Service: Vascular;  Laterality: Left;  . AMPUTATION Left 08/12/2017   Procedure: REVISION BELOW KNEE;  Surgeon: Maeola Harmanain, Brandon Christopher, MD;  [facility];  Service: Vascular;  Laterality: Left;  . AMPUTATION Left 08/27/2017   Procedure: left ABOVE KNEE AMPUTATION;  Surgeon: Maeola Harmanain, Brandon Christopher, MD;  [facility];  Service: Vascular;  Laterality: Left;  . AMPUTATION Left 08/30/2017   Procedure: REVISION AMPUTATION ABOVE KNEE;  Surgeon: Nada LibmanBrabham, Vance W, MD;  [facility];  Service: Vascular;  Laterality: Left;  . AMPUTATION Right 11/04/2017   Procedure: AMPUTATION RIGHT FOURTH TOE;  Surgeon: Maeola Harmanain, Brandon Christopher, MD;  [facility];  Service: Vascular;  Laterality: Right;  . AMPUTATION Right 11/12/2018   Procedure: AMPUTATION OF second, third and fifth toes;  Surgeon: Nada LibmanBrabham, Vance W, MD;  [facility];  Service: Vascular;  Laterality: Right;  .  AMPUTATION Right 01/19/2019   Procedure: AMPUTATION BELOW KNEE;  Surgeon: Sherren Kerns, MD;  Location: [facility] OR;  Service: Vascular;  Laterality: Right;  . APPLICATION OF WOUND VAC Left 08/27/2017   Procedure: APPLICATION OF WOUND VAC;  Surgeon: Maeola Harman, MD;  Location: [facility] OR;  Service: Vascular;  Laterality: Left;  . CARPAL TUNNEL RELEASE Right   . COLONOSCOPY    . LOWER  EXTREMITY ANGIOGRAM Right 11/12/2018   Procedure: ANGIOGRAM OF Right LEG;  Surgeon: Nada Libman, MD;  Location[facility] OR;  Service: Vascular;  Laterality: Right;  . LOWER EXTREMITY INTERVENTION Right 10/09/2017   Procedure: LOWER EXTREMITY INTERVENTION;  Surgeon: Maeola Harman, MD;  Location: [facility] INVASIVE CV LAB;  Service: Cardiovascular;  Laterality: Right;  . TOE AMPUTATION Right 11/12/2018   2ND 3RD & 5TH TOE   . TRANSMETATARSAL AMPUTATION Right 11/19/2018   Procedure: TRANSMETATARSAL AMPUTATION REVISION;  Surgeon: Nada Libman, MD;  Location: [facility] OR;  Service: Vascular;  Laterality: Right;    There were no vitals filed for this visit.   Subjective Assessment - 09/06/20 1343    Subjective She has been able to stand to RW with both prostheses with nephew daily.    Patient Stated Goals Walk with 2 prostheses in home mostly and maybe limited community.    Currently in Pain? Yes    Pain Score 5    right 5/10, left 3/10   Pain Location Hand    Pain Orientation Right;Left    Pain Descriptors / Indicators Pins and needles;Tingling    Pain Type Chronic pain;Neuropathic pain    Pain Onset More than a month ago    Pain Frequency Constant    Aggravating Factors  use of hands    Pain Relieving Factors meds, rest                             [facility] Adult PT Treatment/Exercise - 09/06/20 1345      Transfers   Transfers Sit to Stand;Stand to [employer] Transfers    Sit to Stand 3: Mod assist;With upper extremity assist;With armrests;From chair/3-in-1;Other (comment)   to RW with bil. prostheses, nephew able to assist   Sit to Stand Details Tactile cues for weight shifting;Visual cues for safe use of DME/AE;Verbal cues for sequencing;Verbal cues for technique;Verbal cues for safe use of DME/AE;Manual facilitation for weight bearing;Manual facilitation for weight shifting    Stand to Sit 4: Min assist;With upper extremity assist;With armrests;To chair/3-in-1;Other  (comment)   from RW with bil. prostheses, nephew able to assist   Stand to Sit Details (indicate cue type and reason) Tactile cues for weight shifting;Visual cues for safe use of DME/AE;Verbal cues for technique;Verbal cues for safe use of DME/AE;Manual facilitation for weight shifting    Stand Pivot Transfers 3: Mod assist   RW & bil. prostheses, 1x to right & 1x to left   Stand Pivot Transfer Details (indicate cue type and reason) PT demo & verbal cues prior and manual & verbal cues during transfer.       Ambulation/Gait   Ambulation/Gait Yes    Ambulation/Gait Assistance 2: Max assist;3: Mod assist    Ambulation/Gait Assistance Details excessive UE weight bearing on //bars, PT manually controlling left TFA movement and blocking / extending right knee in stance     Ambulation Distance (Feet) 5 Feet   5' X 2   Assistive device Prostheses;Rolling walker    Ambulation  Surface Level;Indoor      Neuro Re-ed    Neuro Re-ed Details  --      Prosthetics   Prosthetic Care Comments  reviewed donning TTA prosthesis. Pt return demo understanding.  PT recommending prosthetist to fix TFA suspension strap so does not rotate on liner & check offset plate in relation to line of progression to decrease TFA toe-in with standing.  PT reminded nephew to decrease hammock effect of w/c seat with plywood under w/c cushion. He verbalized understanding.     Current prosthetic wear tolerance (days/week)  daily (TTA prosthesis & TFA liner only)    Current prosthetic wear tolerance (#hours/day)  3-5hrs 2-3x/day.  PT recommended wearing liner when napping in recliner and donning prosthesis when getting out of recliner.  Need to remove liner & wear shrinker for 4-6hours /day to enable skin to dry.     Current prosthetic weight-bearing tolerance (hours/day)  No pain or discomfort with standing in //bars.     Edema pitting edema TTA limb    Residual limb condition  right TTA has medium red rash over distal limb but no signs  of infection.  bulbous edematous shape.  no open areas.    TFA no issues noted.     Education Provided Proper Donning;Proper wear schedule/adjustment    Person(s) Educated Patient;Caregiver(s)    Education Method Explanation;Verbal cues    Education Method Verbalized understanding;Verbal cues required;Needs further instruction                    PT Short Term Goals - 08/24/20 1645      PT SHORT TERM GOAL #1   Title Patient reports ability to transfer from her recliner to w/c with TTA proshesis.    Time 1    Period Months    Status New    Target Date 09/29/20      PT SHORT TERM GOAL #2   Title Stand pivot transfer with RW between chairs with armrests to right or left with modA    Time 1    Period Months    Status On-going    Target Date 09/29/20      PT SHORT TERM GOAL #3   Title Sit to/form stand w/c to RW with minimal assist    Time 1    Period Months    Status Revised    Target Date 09/29/20      PT SHORT TERM GOAL #4   Title patient ambulates 6' with RW & bilateral prostheses with modA.    Time 1    Period Months    Status Revised    Target Date 09/29/20      PT SHORT TERM GOAL #5   Title --    Baseline --    Time --    Period --    Status --    Target Date --             PT Long Term Goals - 08/24/20 1631      PT LONG TERM GOAL #1   Title Patient tolerates wear of Transtibial prosthesis and Transfemoral liner  >80% of awake hours and Transfemoral prosthesis >5hours total / day without skin or limb pain issues.  (All Target Date 11/24/2020)    Time 3    Period Months    Status On-going    Target Date 11/24/20      PT LONG TERM GOAL #2   Title Patient verbalizes and demonstrates understading of proper  prosthetic care including donning both prostheses.    Time 3    Period Months    Status On-going    Target Date 11/24/20      PT LONG TERM GOAL #3   Title sit to / from stand from chairs with armrests to RW and maintains upright 2 minutes  with supervision.    Time 3    Period Months    Status Revised    Target Date 11/24/20      PT LONG TERM GOAL #4   Title Patient ambulates 42' with RW & bilateral prostheses with minA from nephew.    Time 3    Period Months    Status Revised    Target Date 11/24/20                 Plan - 09/06/20 1341    Clinical Impression Statement PT reviewed how to donne prosthesis engaging pin and verbalizes better understanding.  Pt is slow improving her ability to turn right & left to position to sit.    Personal Factors and Comorbidities Comorbidity 3+;Education;Fitness;Past/Current Experience;Social Background;Time since onset of injury/illness/exacerbation    Comorbidities Rt TTA, Lt TFA, DM, HTN, PVD, depression, right Carpal Tunnel Release surgery    Examination-Activity Limitations Locomotion Level;Squat;Stand;Transfers;Stairs    Examination-Participation Restrictions Community Activity    Stability/Clinical Decision Making Evolving/Moderate complexity    Rehab Potential Good    PT Frequency 2x / week    PT Duration 12 weeks    PT Treatment/Interventions ADLs/Self Care Home Management;DME Instruction;Gait training;Stair training;Functional mobility training;Therapeutic activities;Therapeutic exercise;Balance training;Neuromuscular re-education;Patient/family education;Prosthetic Training;Manual techniques;Scar mobilization;Passive range of motion;Vasopneumatic Device;Vestibular    PT Next Visit Plan standing & gait with RW    Consulted and Agree with Plan of Care Patient;Family member/caregiver    Family Member Consulted nephew / caregiver Marikay Alar           Patient will benefit from skilled therapeutic intervention in order to improve the following deficits and impairments:  Abnormal gait, Cardiopulmonary status limiting activity, Decreased activity tolerance, Decreased balance, Decreased endurance, Decreased knowledge of use of DME, Decreased mobility, Decreased range of  motion, Decreased skin integrity, Decreased scar mobility, Decreased strength, Dizziness, Increased edema, Impaired flexibility, Postural dysfunction, Prosthetic Dependency, Obesity, Pain  Visit Diagnosis: Unsteadiness on feet  Muscle weakness (generalized)  Other abnormalities of gait and mobility  Abnormal posture  Pain in right hand  Stiffness of left hip, not elsewhere classified     Problem List Patient Active Problem List   Diagnosis Date Noted  . PTSD (post-traumatic stress disorder)   . Mixed stress and urge urinary incontinence 06/11/2019  . Right below-knee amputee 01/23/2019  . Ulnar neuropathy of left upper extremity 10/23/2018  . Pain in finger of right hand 02/20/2018  . Atherosclerosis of native arteries of the extremities with gangrene 11/01/2017  . OSA (obstructive sleep apnea)   . Benign essential HTN   . DM (diabetes mellitus), secondary, uncontrolled, with peripheral vascular complications   . Hyperkalemia   . Leukocytosis   . Acute blood loss anemia   . Amputation of left lower extremity above knee 08/23/2017  . PAD (peripheral artery disease) 07/23/2017  . Cellulitis 06/10/2017  . Hyponatremia 06/10/2017  . Fever 06/10/2017  . Rash 05/24/2017  . Allergic rhinitis   . Insomnia     Vladimir Faster, PT, DPT 09/06/2020, 3:40 PM  [facility] [address] [place], [place], [address] Phone: [phone]   Fax:  [phone]  Name: Danielle Buchanan MRN: [id number] Date of Birth: Feb 25, 1956

## 2020-09-08 ENCOUNTER — Ambulatory Visit (INDEPENDENT_AMBULATORY_CARE_PROVIDER_SITE_OTHER): Payer: Medicare HMO | Admitting: Physical Therapy

## 2020-09-08 ENCOUNTER — Other Ambulatory Visit: Payer: Self-pay

## 2020-09-08 ENCOUNTER — Encounter: Payer: Self-pay | Admitting: Physical Therapy

## 2020-09-08 DIAGNOSIS — R293 Abnormal posture: Secondary | ICD-10-CM

## 2020-09-08 DIAGNOSIS — M79641 Pain in right hand: Secondary | ICD-10-CM

## 2020-09-08 DIAGNOSIS — M6281 Muscle weakness (generalized): Secondary | ICD-10-CM | POA: Diagnosis not present

## 2020-09-08 DIAGNOSIS — R2681 Unsteadiness on feet: Secondary | ICD-10-CM

## 2020-09-08 DIAGNOSIS — R2689 Other abnormalities of gait and mobility: Secondary | ICD-10-CM | POA: Diagnosis not present

## 2020-09-08 DIAGNOSIS — M25652 Stiffness of left hip, not elsewhere classified: Secondary | ICD-10-CM | POA: Diagnosis not present

## 2020-09-08 NOTE — Therapy (Signed)
[facility] Physical Therapy [address] [place], [place], [address] Phone: [phone]   Fax:  [phone]  Physical Therapy Treatment  Patient Details  Name: Danielle Buchanan MRN: [id number] Date of Birth: 1956-01-02 Referring Provider (PT): [name], MD   Encounter Date: 09/08/2020   PT End of Session - 09/08/20 1300    Visit Number 25    Number of Visits 52    Date for PT Re-Evaluation 11/18/20    Authorization Type [employer]    Authorization Time Period $10 co-pay,    Progress Note Due on Visit 33    PT Start Time 1300    PT Stop Time 1342    PT Time Calculation (min) 42 min    Equipment Utilized During Treatment Gait belt    Activity Tolerance Patient tolerated treatment well    Behavior During Therapy Flat affect;WFL for tasks assessed/performed           Past Medical History:  Diagnosis Date   Allergic rhinitis    Amputation of left lower extremity above knee 08/23/2017   Amputation stump infection 08/23/2017   Atherosclerosis of native arteries of the extremities with gangrene 11/01/2017   2/4-2/15/2020 infected/necrotic third right toe 2/5 transmetatarsal amputation of toes 2-5 with removal of the big toe 2/12 by Dr Myra Gianotti All margins clean; Bactrim prescribed for Serratia osteomyelitis   DM (diabetes mellitus), secondary, uncontrolled, with peripheral vascular complications    Current 11/12/2018 A1c 10.9%   Dyslipidemia    Essential hypertension, benign    Gangrene of right foot 01/17/2019   Hyperkalemia    Hyponatremia 06/10/2017   Insomnia    Leukocytosis    Mixed stress and urge urinary incontinence 06/11/2019   OSA (obstructive sleep apnea)    no cpap   Osteomyelitis of third toe of right foot 11/12/2018   PAD (peripheral artery disease) 07/23/2017   Pain in finger of right hand 02/20/2018   PTSD (post-traumatic stress disorder)    Right below-knee amputee 01/23/2019   Ulnar neuropathy of left upper extremity  10/23/2018    Past Surgical History:  Procedure Laterality Date   ABDOMINAL AORTOGRAM W/LOWER EXTREMITY N/A 06/24/2017   Procedure: ABDOMINAL AORTOGRAM W/LOWER EXTREMITY;  Surgeon: Maeola Harman, MD;  Location: [facility] INVASIVE CV LAB;  Service: Cardiovascular;  Laterality: N/A;   ABDOMINAL AORTOGRAM W/LOWER EXTREMITY Right 10/09/2017   Procedure: ABDOMINAL AORTOGRAM W/LOWER EXTREMITY;  Surgeon: Maeola Harman, MD;  Location: [facility] INVASIVE CV LAB;  Service: Cardiovascular;  Laterality: Right;   AMPUTATION Left 07/23/2017   Procedure: AMPUTATION  BELOW KNEE;  Surgeon: Maeola Harman, MD;  Location: [facility] OR;  Service: Vascular;  Laterality: Left;   AMPUTATION Left 08/12/2017   Procedure: REVISION BELOW KNEE;  Surgeon: Maeola Harman, MD;  Location: [facility] OR;  Service: Vascular;  Laterality: Left;   AMPUTATION Left 08/27/2017   Procedure: left ABOVE KNEE AMPUTATION;  Surgeon: Maeola Harman, MD;  Location: [facility] OR;  Service: Vascular;  Laterality: Left;   AMPUTATION Left 08/30/2017   Procedure: REVISION AMPUTATION ABOVE KNEE;  Surgeon: Nada Libman, MD;  Location: [facility] OR;  Service: Vascular;  Laterality: Left;   AMPUTATION Right 11/04/2017   Procedure: AMPUTATION RIGHT FOURTH TOE;  Surgeon: Maeola Harman, MD;  Location: [facility] OR;  Service: Vascular;  Laterality: Right;   AMPUTATION Right 11/12/2018   Procedure: AMPUTATION OF second, third and fifth toes;  Surgeon: Nada Libman, MD;  Location: [facility] OR;  Service: Vascular;  Laterality: Right;  AMPUTATION Right 01/19/2019   Procedure: AMPUTATION BELOW KNEE;  Surgeon: Sherren Kerns, MD;  Location: [facility] OR;  Service: Vascular;  Laterality: Right;   APPLICATION OF WOUND VAC Left 08/27/2017   Procedure: APPLICATION OF WOUND VAC;  Surgeon: Maeola Harman, MD;  Location: [facility] OR;  Service: Vascular;  Laterality: Left;   CARPAL TUNNEL RELEASE Right    COLONOSCOPY     LOWER  EXTREMITY ANGIOGRAM Right 11/12/2018   Procedure: Rosalin Hawking OF Right LEG;  Surgeon: Nada Libman, MD;  Location: [facility] OR;  Service: Vascular;  Laterality: Right;   LOWER EXTREMITY INTERVENTION Right 10/09/2017   Procedure: LOWER EXTREMITY INTERVENTION;  Surgeon: Maeola Harman, MD;  Location: [facility] INVASIVE CV LAB;  Service: Cardiovascular;  Laterality: Right;   TOE AMPUTATION Right 11/12/2018   2ND 3RD & 5TH TOE    TRANSMETATARSAL AMPUTATION Right 11/19/2018   Procedure: TRANSMETATARSAL AMPUTATION REVISION;  Surgeon: Nada Libman, MD;  Location: [facility] OR;  Service: Vascular;  Laterality: Right;    There were no vitals filed for this visit.   Subjective Assessment - 09/08/20 1300    Subjective She sees prosthetist tomorrow and thinks they will get new liners.  Nephew verbalizes PT recommendations for discussion with prosthetist.    Patient Stated Goals Walk with 2 prostheses in home mostly and maybe limited community.    Currently in Pain? Yes    Pain Score 5     Pain Location Hand    Pain Orientation Right;Left    Pain Descriptors / Indicators Pins and needles;Tingling    Pain Type Chronic pain;Neuropathic pain    Pain Onset More than a month ago    Aggravating Factors  use of hands & positioning    Pain Relieving Factors meds & ice                             [facility] Adult PT Treatment/Exercise - 09/08/20 1300      Transfers   Transfers Sit to Stand;Stand to [employer] Transfers    Sit to Stand 3: Mod assist;With upper extremity assist;With armrests;From chair/3-in-1;Other (comment)   to RW with bil. prostheses   Sit to Stand Details Tactile cues for weight shifting;Visual cues for safe use of DME/AE;Verbal cues for sequencing;Verbal cues for technique;Verbal cues for safe use of DME/AE;Manual facilitation for weight bearing;Manual facilitation for weight shifting    Stand to Sit 4: Min assist;With upper extremity assist;With armrests;To  chair/3-in-1;Other (comment)   from RW with bil. prostheses   Stand to Sit Details (indicate cue type and reason) Tactile cues for weight shifting;Visual cues for safe use of DME/AE;Verbal cues for technique;Verbal cues for safe use of DME/AE;Manual facilitation for weight shifting    Stand Pivot Transfers 2: Max assist   RW & bil. prostheses, 1x to right & 1x to left   Stand Pivot Transfer Details (indicate cue type and reason) PT demo & verbal cues prior and manual & verbal cues during transfer.    Lateral/Scoot Transfers 4: Min assist;With armrests removed    Lateral/Scoot Transfer Details (indicate cue type and reason) w/c to Nustep to left with TTA prosthesis only.       Ambulation/Gait   Ambulation/Gait Yes    Ambulation/Gait Assistance 2: Max assist;3: Mod assist    Ambulation/Gait Assistance Details excessive UE weight bearing on //bars, PT manually controlling left TFA movement and blocking / extending right knee in stance  Ambulation Distance (Feet) 5 Feet   5' X 2   Assistive device Prostheses;Rolling walker    Ambulation Surface Level;Indoor      Neuro Re-ed    Neuro Re-ed Details  standing with RW support: head turns right/left & up/down 3 reps ea. with minA to modA once PT positioned for stability.  2nd stand worked on turning RW to right, straight & to left 2x ea with modA then maxA when fatigued.       Knee/Hip Exercises: Aerobic   Nustep BUEs & RLE (TTA) level 5 for 8 minutes      Prosthetics   Prosthetic Care Comments  reviewed donning TTA prosthesis. Pt return demo understanding.  PT recommending prosthetist to fix TFA suspension strap so does not rotate on liner & check offset plate in relation to line of progression to decrease TFA toe-in with standing.  PT reminded nephew to decrease hammock effect of w/c seat with plywood under w/c cushion. He verbalized understanding.     Current prosthetic wear tolerance (days/week)  daily (TTA prosthesis & TFA liner only)     Current prosthetic wear tolerance (#hours/day)  3-5hrs 2-3x/day.  PT recommended wearing liner when napping in recliner and donning prosthesis when getting out of recliner.  Need to remove liner & wear shrinker for 4-6hours /day to enable skin to dry.     Current prosthetic weight-bearing tolerance (hours/day)  No pain or discomfort with standing in //bars.     Edema pitting edema TTA limb    Residual limb condition  right TTA has medium red rash over distal limb but no signs of infection.  bulbous edematous shape.  no open areas.    TFA no issues noted.     Education Provided Proper Donning;Proper wear schedule/adjustment                    PT Short Term Goals - 08/24/20 1645      PT SHORT TERM GOAL #1   Title Patient reports ability to transfer from her recliner to w/c with TTA proshesis.    Time 1    Period Months    Status New    Target Date 09/29/20      PT SHORT TERM GOAL #2   Title Stand pivot transfer with RW between chairs with armrests to right or left with modA    Time 1    Period Months    Status On-going    Target Date 09/29/20      PT SHORT TERM GOAL #3   Title Sit to/form stand w/c to RW with minimal assist    Time 1    Period Months    Status Revised    Target Date 09/29/20      PT SHORT TERM GOAL #4   Title patient ambulates 8215' with RW & bilateral prostheses with modA.    Time 1    Period Months    Status Revised    Target Date 09/29/20      PT SHORT TERM GOAL #5   Title --    Baseline --    Time --    Period --    Status --    Target Date --             PT Long Term Goals - 08/24/20 1631      PT LONG TERM GOAL #1   Title Patient tolerates wear of Transtibial prosthesis and Transfemoral liner  >80% of awake hours and  Transfemoral prosthesis >5hours total / day without skin or limb pain issues.  (All Target Date 11/24/2020)    Time 3    Period Months    Status On-going    Target Date 11/24/20      PT LONG TERM GOAL #2   Title  Patient verbalizes and demonstrates understading of proper prosthetic care including donning both prostheses.    Time 3    Period Months    Status On-going    Target Date 11/24/20      PT LONG TERM GOAL #3   Title sit to / from stand from chairs with armrests to RW and maintains upright 2 minutes with supervision.    Time 3    Period Months    Status Revised    Target Date 11/24/20      PT LONG TERM GOAL #4   Title Patient ambulates 67' with RW & bilateral prostheses with minA from nephew.    Time 3    Period Months    Status Revised    Target Date 11/24/20                 Plan - 09/08/20 1300    Clinical Impression Statement Patient's posture makes her lean hard on UEs with RW which limits her standing balance.  She is improving prosthetic gait with RW on straight path but continues to struggle with turns to position to sit.    Personal Factors and Comorbidities Comorbidity 3+;Education;Fitness;Past/Current Experience;Social Background;Time since onset of injury/illness/exacerbation    Comorbidities Rt TTA, Lt TFA, DM, HTN, PVD, depression, right Carpal Tunnel Release surgery    Examination-Activity Limitations Locomotion Level;Squat;Stand;Transfers;Stairs    Examination-Participation Restrictions Community Activity    Stability/Clinical Decision Making Evolving/Moderate complexity    Rehab Potential Good    PT Frequency 2x / week    PT Duration 12 weeks    PT Treatment/Interventions ADLs/Self Care Home Management;DME Instruction;Gait training;Stair training;Functional mobility training;Therapeutic activities;Therapeutic exercise;Balance training;Neuromuscular re-education;Patient/family education;Prosthetic Training;Manual techniques;Scar mobilization;Passive range of motion;Vasopneumatic Device;Vestibular    PT Next Visit Plan standing & gait with RW    Consulted and Agree with Plan of Care Patient;Family member/caregiver    Family Member Consulted nephew / caregiver Marikay Alar           Patient will benefit from skilled therapeutic intervention in order to improve the following deficits and impairments:  Abnormal gait, Cardiopulmonary status limiting activity, Decreased activity tolerance, Decreased balance, Decreased endurance, Decreased knowledge of use of DME, Decreased mobility, Decreased range of motion, Decreased skin integrity, Decreased scar mobility, Decreased strength, Dizziness, Increased edema, Impaired flexibility, Postural dysfunction, Prosthetic Dependency, Obesity, Pain  Visit Diagnosis: Unsteadiness on feet  Muscle weakness (generalized)  Other abnormalities of gait and mobility  Abnormal posture  Pain in right hand  Stiffness of left hip, not elsewhere classified     Problem List Patient Active Problem List   Diagnosis Date Noted   PTSD (post-traumatic stress disorder)    Mixed stress and urge urinary incontinence 06/11/2019   Right below-knee amputee 01/23/2019   Ulnar neuropathy of left upper extremity 10/23/2018   Pain in finger of right hand 02/20/2018   Atherosclerosis of native arteries of the extremities with gangrene 11/01/2017   OSA (obstructive sleep apnea)    Benign essential HTN    DM (diabetes mellitus), secondary, uncontrolled, with peripheral vascular complications    Hyperkalemia    Leukocytosis    Acute blood loss anemia    Amputation of left lower  extremity above knee 08/23/2017   PAD (peripheral artery disease) 07/23/2017   Cellulitis 06/10/2017   Hyponatremia 06/10/2017   Fever 06/10/2017   Rash 05/24/2017   Allergic rhinitis    Insomnia     Vladimir Faster, PT, DPT 09/08/2020, 1:49 PM  [facility] Physical Therapy [address] [place], [place], [address] Phone: [phone]   Fax:  [phone]  Name: Danielle Buchanan MRN: [id number] Date of Birth: 09-Dec-1955

## 2020-09-09 DIAGNOSIS — Z89619 Acquired absence of unspecified leg above knee: Secondary | ICD-10-CM | POA: Diagnosis not present

## 2020-09-13 ENCOUNTER — Other Ambulatory Visit: Payer: Self-pay

## 2020-09-13 ENCOUNTER — Encounter: Payer: Self-pay | Admitting: Physical Therapy

## 2020-09-13 ENCOUNTER — Ambulatory Visit (INDEPENDENT_AMBULATORY_CARE_PROVIDER_SITE_OTHER): Payer: Medicare HMO | Admitting: Physical Therapy

## 2020-09-13 DIAGNOSIS — M6281 Muscle weakness (generalized): Secondary | ICD-10-CM | POA: Diagnosis not present

## 2020-09-13 DIAGNOSIS — R2681 Unsteadiness on feet: Secondary | ICD-10-CM | POA: Diagnosis not present

## 2020-09-13 DIAGNOSIS — R2689 Other abnormalities of gait and mobility: Secondary | ICD-10-CM

## 2020-09-13 DIAGNOSIS — M25652 Stiffness of left hip, not elsewhere classified: Secondary | ICD-10-CM | POA: Diagnosis not present

## 2020-09-13 DIAGNOSIS — M79641 Pain in right hand: Secondary | ICD-10-CM | POA: Diagnosis not present

## 2020-09-13 DIAGNOSIS — R293 Abnormal posture: Secondary | ICD-10-CM | POA: Diagnosis not present

## 2020-09-13 NOTE — Therapy (Signed)
[facility] [address] [place], [place], [id number] Phone: [phone]   Fax:  [phone]  Physical Therapy Treatment  Patient Details  Name: Danielle Buchanan MRN: [id number] Date of Birth: 1956-01-12 Referring Provider (PT): Shirlean Mylararol Webb, MD   Encounter Date: 09/13/2020   PT End of Session - 09/13/20 1308    Visit Number 26    Number of Visits 52    Date for PT Re-Evaluation 11/18/20    Authorization Type [employer]    Authorization Time Period $10 co-pay,  11/30 -    Authorization - Visit Number 3    Authorization - Number of Visits 12    Progress Note Due on Visit 33    PT Start Time 1300    PT Stop Time 1345    PT Time Calculation (min) 45 min    Equipment Utilized During Treatment Gait belt    Activity Tolerance Patient tolerated treatment well    Behavior During Therapy Flat affect;WFL for tasks assessed/performed           Past Medical History:  Diagnosis Date  . Allergic rhinitis   . Amputation of left lower extremity above knee 08/23/2017  . Amputation stump infection 08/23/2017  . Atherosclerosis of native arteries of the extremities with gangrene 11/01/2017   2/4-2/15/2020 infected/necrotic third right toe 2/5 transmetatarsal amputation of toes 2-5 with removal of the big toe 2/12 by Dr Myra GianottiBrabham All margins clean; Bactrim prescribed for Serratia osteomyelitis  . DM (diabetes mellitus), secondary, uncontrolled, with peripheral vascular complications    Current 11/12/2018 A1c 10.9%  . Dyslipidemia   . Essential hypertension, benign   . Gangrene of right foot 01/17/2019  . Hyperkalemia   . Hyponatremia 06/10/2017  . Insomnia   . Leukocytosis   . Mixed stress and urge urinary incontinence 06/11/2019  . OSA (obstructive sleep apnea)    no cpap  . Osteomyelitis of third toe of right foot 11/12/2018  . PAD (peripheral artery disease) 07/23/2017  . Pain in finger of right hand 02/20/2018  . PTSD (post-traumatic stress disorder)    . Right below-knee amputee 01/23/2019  . Ulnar neuropathy of left upper extremity 10/23/2018    Past Surgical History:  Procedure Laterality Date  . ABDOMINAL AORTOGRAM W/LOWER EXTREMITY N/A 06/24/2017   Procedure: ABDOMINAL AORTOGRAM W/LOWER EXTREMITY;  Surgeon: Maeola Harmanain, Brandon Christopher, MD;  [facility];  Service: Cardiovascular;  Laterality: N/A;  . ABDOMINAL AORTOGRAM W/LOWER EXTREMITY Right 10/09/2017   Procedure: ABDOMINAL AORTOGRAM W/LOWER EXTREMITY;  Surgeon: Maeola Harmanain, Brandon Christopher, MD;  [facility];  Service: Cardiovascular;  Laterality: Right;  . AMPUTATION Left 07/23/2017   Procedure: AMPUTATION  BELOW KNEE;  Surgeon: Maeola Harmanain, Brandon Christopher, MD;  [facility];  Service: Vascular;  Laterality: Left;  . AMPUTATION Left 08/12/2017   Procedure: REVISION BELOW KNEE;  Surgeon: Maeola Harmanain, Brandon Christopher, MD;  [facility];  Service: Vascular;  Laterality: Left;  . AMPUTATION Left 08/27/2017   Procedure: left ABOVE KNEE AMPUTATION;  Surgeon: Maeola Harmanain, Brandon Christopher, MD;  [facility];  Service: Vascular;  Laterality: Left;  . AMPUTATION Left 08/30/2017   Procedure: REVISION AMPUTATION ABOVE KNEE;  Surgeon: Nada LibmanBrabham, Vance W, MD;  [facility];  Service: Vascular;  Laterality: Left;  . AMPUTATION Right 11/04/2017   Procedure: AMPUTATION RIGHT FOURTH TOE;  Surgeon: Maeola Harmanain, Brandon Christopher, MD;  [facility];  Service: Vascular;  Laterality: Right;  . AMPUTATION Right 11/12/2018   Procedure: AMPUTATION OF second, third  and fifth toes;  Surgeon: Nada Libman, MD;  Location[facility] OR;  Service: Vascular;  Laterality: Right;  . AMPUTATION Right 01/19/2019   Procedure: AMPUTATION BELOW KNEE;  Surgeon: Sherren Kerns, MD;  Location: [facility] OR;  Service: Vascular;  Laterality: Right;  . APPLICATION OF WOUND VAC Left 08/27/2017   Procedure: APPLICATION OF WOUND VAC;  Surgeon: Maeola Harman, MD;  Location: [facility];  Service: Vascular;   Laterality: Left;  . CARPAL TUNNEL RELEASE Right   . COLONOSCOPY    . LOWER EXTREMITY ANGIOGRAM Right 11/12/2018   Procedure: ANGIOGRAM OF Right LEG;  Surgeon: Nada Libman, MD;  Location[facility] OR;  Service: Vascular;  Laterality: Right;  . LOWER EXTREMITY INTERVENTION Right 10/09/2017   Procedure: LOWER EXTREMITY INTERVENTION;  Surgeon: Maeola Harman, MD;  Location: [facility] INVASIVE CV LAB;  Service: Cardiovascular;  Laterality: Right;  . TOE AMPUTATION Right 11/12/2018   2ND 3RD & 5TH TOE   . TRANSMETATARSAL AMPUTATION Right 11/19/2018   Procedure: TRANSMETATARSAL AMPUTATION REVISION;  Surgeon: Nada Libman, MD;  Location: [facility] OR;  Service: Vascular;  Laterality: Right;    There were no vitals filed for this visit.   Subjective Assessment - 09/13/20 1303    Subjective She saw prosthetist on Friday who issued 2 new TFA liners with lock tighting strap.  She also added trouser pad to posterior outside of TFA socket.    Currently in Pain? Yes    Pain Score 5     Pain Location Hand    Pain Orientation Right;Left    Pain Descriptors / Indicators Pins and needles;Tingling    Pain Type Chronic pain;Neuropathic pain    Pain Onset More than a month ago    Pain Frequency Constant    Aggravating Factors  neuropathy    Pain Relieving Factors meds & rest                             [facility] Adult PT Treatment/Exercise - 09/13/20 1300      Transfers   Transfers Sit to Stand;Stand to [employer] Transfers    Sit to Stand 3: Mod assist;With upper extremity assist;With armrests;From chair/3-in-1;Other (comment)   to RW with bil. prostheses   Sit to Stand Details Tactile cues for weight shifting;Visual cues for safe use of DME/AE;Verbal cues for sequencing;Verbal cues for technique;Verbal cues for safe use of DME/AE;Manual facilitation for weight bearing;Manual facilitation for weight shifting    Stand to Sit 4: Min assist;With upper extremity assist;With armrests;To  chair/3-in-1;Other (comment)   from RW with bil. prostheses   Stand to Sit Details (indicate cue type and reason) Tactile cues for weight shifting;Visual cues for safe use of DME/AE;Verbal cues for technique;Verbal cues for safe use of DME/AE;Manual facilitation for weight shifting    Stand Pivot Transfers 2: Max assist   //bars & bil. prostheses, 1x to right & 1x to left   Lateral/Scoot Transfers --      Ambulation/Gait   Ambulation/Gait Yes    Ambulation/Gait Assistance 3: Mod assist    Ambulation/Gait Assistance Details theraband on RW for visual cues to step width.  verbal & manual/tactile on upright posture and sequence.      Ambulation Distance (Feet) 20 Feet   20' & 14'   Assistive device Prostheses;Rolling walker    Ambulation Surface Level;Indoor      Neuro Re-ed    Neuro Re-ed Details  standing in //bars: moving  UEs opposite directions (simulating UE motion to turn RW);  turning feet 90* right/forward /left / forward  2 sets;  turning 180* CW & CCW using //bars with maxA.       Knee/Hip Exercises: Aerobic   Nustep --      Prosthetics   Prosthetic Care Comments  --    Current prosthetic wear tolerance (days/week)  daily (TTA prosthesis & TFA liner only)    Current prosthetic wear tolerance (#hours/day)  3-5hrs 2-3x/day.  PT recommended wearing liner when napping in recliner and donning prosthesis when getting out of recliner.  Need to remove liner & wear shrinker for 4-6hours /day to enable skin to dry.     Current prosthetic weight-bearing tolerance (hours/day)  No pain or discomfort with standing in //bars.     Edema pitting edema TTA limb    Residual limb condition  right TTA has medium red rash over distal limb but no signs of infection.  bulbous edematous shape.  no open areas.    TFA no issues noted.     Education Provided Proper Donning;Proper wear schedule/adjustment                    PT Short Term Goals - 08/24/20 1645      PT SHORT TERM GOAL #1   Title  Patient reports ability to transfer from her recliner to w/c with TTA proshesis.    Time 1    Period Months    Status New    Target Date 09/29/20      PT SHORT TERM GOAL #2   Title Stand pivot transfer with RW between chairs with armrests to right or left with modA    Time 1    Period Months    Status On-going    Target Date 09/29/20      PT SHORT TERM GOAL #3   Title Sit to/form stand w/c to RW with minimal assist    Time 1    Period Months    Status Revised    Target Date 09/29/20      PT SHORT TERM GOAL #4   Title patient ambulates 31' with RW & bilateral prostheses with modA.    Time 1    Period Months    Status Revised    Target Date 09/29/20      PT SHORT TERM GOAL #5   Title --    Baseline --    Time --    Period --    Status --    Target Date --             PT Long Term Goals - 08/24/20 1631      PT LONG TERM GOAL #1   Title Patient tolerates wear of Transtibial prosthesis and Transfemoral liner  >80% of awake hours and Transfemoral prosthesis >5hours total / day without skin or limb pain issues.  (All Target Date 11/24/2020)    Time 3    Period Months    Status On-going    Target Date 11/24/20      PT LONG TERM GOAL #2   Title Patient verbalizes and demonstrates understading of proper prosthetic care including donning both prostheses.    Time 3    Period Months    Status On-going    Target Date 11/24/20      PT LONG TERM GOAL #3   Title sit to / from stand from chairs with armrests to RW and maintains upright 2 minutes  with supervision.    Time 3    Period Months    Status Revised    Target Date 11/24/20      PT LONG TERM GOAL #4   Title Patient ambulates 60' with RW & bilateral prostheses with minA from nephew.    Time 3    Period Months    Status Revised    Target Date 11/24/20                 Plan - 09/13/20 1309    Clinical Impression Statement PT worked on upper body & lower body motion for turning 90* right & left which she  improved with instruction & practice.    Personal Factors and Comorbidities Comorbidity 3+;Education;Fitness;Past/Current Experience;Social Background;Time since onset of injury/illness/exacerbation    Comorbidities Rt TTA, Lt TFA, DM, HTN, PVD, depression, right Carpal Tunnel Release surgery    Examination-Activity Limitations Locomotion Level;Squat;Stand;Transfers;Stairs    Examination-Participation Restrictions Community Activity    Stability/Clinical Decision Making Evolving/Moderate complexity    Rehab Potential Good    PT Frequency 2x / week    PT Duration 12 weeks    PT Treatment/Interventions ADLs/Self Care Home Management;DME Instruction;Gait training;Stair training;Functional mobility training;Therapeutic activities;Therapeutic exercise;Balance training;Neuromuscular re-education;Patient/family education;Prosthetic Training;Manual techniques;Scar mobilization;Passive range of motion;Vasopneumatic Device;Vestibular    PT Next Visit Plan standing & gait with RW    Consulted and Agree with Plan of Care Patient;Family member/caregiver    Family Member Consulted nephew / caregiver Marikay Alar           Patient will benefit from skilled therapeutic intervention in order to improve the following deficits and impairments:  Abnormal gait, Cardiopulmonary status limiting activity, Decreased activity tolerance, Decreased balance, Decreased endurance, Decreased knowledge of use of DME, Decreased mobility, Decreased range of motion, Decreased skin integrity, Decreased scar mobility, Decreased strength, Dizziness, Increased edema, Impaired flexibility, Postural dysfunction, Prosthetic Dependency, Obesity, Pain  Visit Diagnosis: Unsteadiness on feet  Muscle weakness (generalized)  Other abnormalities of gait and mobility  Abnormal posture  Pain in right hand  Stiffness of left hip, not elsewhere classified     Problem List Patient Active Problem List   Diagnosis Date Noted  .  PTSD (post-traumatic stress disorder)   . Mixed stress and urge urinary incontinence 06/11/2019  . Right below-knee amputee 01/23/2019  . Ulnar neuropathy of left upper extremity 10/23/2018  . Pain in finger of right hand 02/20/2018  . Atherosclerosis of native arteries of the extremities with gangrene 11/01/2017  . OSA (obstructive sleep apnea)   . Benign essential HTN   . DM (diabetes mellitus), secondary, uncontrolled, with peripheral vascular complications   . Hyperkalemia   . Leukocytosis   . Acute blood loss anemia   . Amputation of left lower extremity above knee 08/23/2017  . PAD (peripheral artery disease) 07/23/2017  . Cellulitis 06/10/2017  . Hyponatremia 06/10/2017  . Fever 06/10/2017  . Rash 05/24/2017  . Allergic rhinitis   . Insomnia     Vladimir Faster PT, DPT 09/13/2020, 3:37 PM  [facility] [address] [place], [place], [address] Phone: [phone]   Fax:  [phone]  Name: Danielle Buchanan MRN: [id number] Date of Birth: 05-Feb-1956

## 2020-09-15 ENCOUNTER — Encounter: Payer: Self-pay | Admitting: Physical Therapy

## 2020-09-15 ENCOUNTER — Ambulatory Visit (INDEPENDENT_AMBULATORY_CARE_PROVIDER_SITE_OTHER): Payer: Medicare HMO | Admitting: Physical Therapy

## 2020-09-15 ENCOUNTER — Other Ambulatory Visit: Payer: Self-pay

## 2020-09-15 DIAGNOSIS — R293 Abnormal posture: Secondary | ICD-10-CM

## 2020-09-15 DIAGNOSIS — M79641 Pain in right hand: Secondary | ICD-10-CM

## 2020-09-15 DIAGNOSIS — M25652 Stiffness of left hip, not elsewhere classified: Secondary | ICD-10-CM

## 2020-09-15 DIAGNOSIS — R2681 Unsteadiness on feet: Secondary | ICD-10-CM

## 2020-09-15 DIAGNOSIS — M6281 Muscle weakness (generalized): Secondary | ICD-10-CM

## 2020-09-15 DIAGNOSIS — R2689 Other abnormalities of gait and mobility: Secondary | ICD-10-CM | POA: Diagnosis not present

## 2020-09-15 NOTE — Patient Instructions (Addendum)
Stand up & sit down 2 times 3rd time stand as long as tolerable (time & keep record) Rest >/= 60 seconds 4th stand looking right - middle - left - middle (time & keep record) Rest >/= 60 seconds 5th stand looking up (chin up) forward, look down (chin down) forward (time & keep record) Rest >/= 60 seconds 6th stand turn walker right, straight, left, straight Rest >/= 60 seconds 7th or more back to stand up & sit downs.  Rest >/= 30 seconds between each rep.

## 2020-09-15 NOTE — Therapy (Signed)
[facility] [address] [place], [place], [id number] Phone: [phone]   Fax:  [phone]  Physical Therapy Treatment  Patient Details  Name: Danielle ContesJanice M Estis MRN: [id number] Date of Birth: 1955/10/25 Referring Provider (PT): Shirlean Mylararol Webb, MD   Encounter Date: 09/15/2020   PT End of Session - 09/15/20 1300    Visit Number 27    Number of Visits 52    Date for PT Re-Evaluation 11/18/20    Authorization Type [employer]    Authorization Time Period $10 co-pay,  09/08/2020 - 10/20/2020    Authorization - Visit Number 4    Authorization - Number of Visits 12    Progress Note Due on Visit 33    PT Start Time 1300    PT Stop Time 1344    PT Time Calculation (min) 44 min    Equipment Utilized During Treatment Gait belt    Activity Tolerance Patient tolerated treatment well    Behavior During Therapy Flat affect;WFL for tasks assessed/performed           Past Medical History:  Diagnosis Date  . Allergic rhinitis   . Amputation of left lower extremity above knee 08/23/2017  . Amputation stump infection 08/23/2017  . Atherosclerosis of native arteries of the extremities with gangrene 11/01/2017   2/4-2/15/2020 infected/necrotic third right toe 2/5 transmetatarsal amputation of toes 2-5 with removal of the big toe 2/12 by Dr Myra GianottiBrabham All margins clean; Bactrim prescribed for Serratia osteomyelitis  . DM (diabetes mellitus), secondary, uncontrolled, with peripheral vascular complications    Current 11/12/2018 A1c 10.9%  . Dyslipidemia   . Essential hypertension, benign   . Gangrene of right foot 01/17/2019  . Hyperkalemia   . Hyponatremia 06/10/2017  . Insomnia   . Leukocytosis   . Mixed stress and urge urinary incontinence 06/11/2019  . OSA (obstructive sleep apnea)    no cpap  . Osteomyelitis of third toe of right foot 11/12/2018  . PAD (peripheral artery disease) 07/23/2017  . Pain in finger of right hand 02/20/2018  . PTSD (post-traumatic  stress disorder)   . Right below-knee amputee 01/23/2019  . Ulnar neuropathy of left upper extremity 10/23/2018    Past Surgical History:  Procedure Laterality Date  . ABDOMINAL AORTOGRAM W/LOWER EXTREMITY N/A 06/24/2017   Procedure: ABDOMINAL AORTOGRAM W/LOWER EXTREMITY;  Surgeon: Maeola Harmanain, Brandon Christopher, MD;  [facility];  Service: Cardiovascular;  Laterality: N/A;  . ABDOMINAL AORTOGRAM W/LOWER EXTREMITY Right 10/09/2017   Procedure: ABDOMINAL AORTOGRAM W/LOWER EXTREMITY;  Surgeon: Maeola Harmanain, Brandon Christopher, MD;  [facility];  Service: Cardiovascular;  Laterality: Right;  . AMPUTATION Left 07/23/2017   Procedure: AMPUTATION  BELOW KNEE;  Surgeon: Maeola Harmanain, Brandon Christopher, MD;  [facility];  Service: Vascular;  Laterality: Left;  . AMPUTATION Left 08/12/2017   Procedure: REVISION BELOW KNEE;  Surgeon: Maeola Harmanain, Brandon Christopher, MD;  [facility];  Service: Vascular;  Laterality: Left;  . AMPUTATION Left 08/27/2017   Procedure: left ABOVE KNEE AMPUTATION;  Surgeon: Maeola Harmanain, Brandon Christopher, MD;  [facility];  Service: Vascular;  Laterality: Left;  . AMPUTATION Left 08/30/2017   Procedure: REVISION AMPUTATION ABOVE KNEE;  Surgeon: Nada LibmanBrabham, Vance W, MD;  [facility];  Service: Vascular;  Laterality: Left;  . AMPUTATION Right 11/04/2017   Procedure: AMPUTATION RIGHT FOURTH TOE;  Surgeon: Maeola Harmanain, Brandon Christopher, MD;  [facility];  Service: Vascular;  Laterality: Right;  . AMPUTATION Right 11/12/2018   Procedure: AMPUTATION OF second,  third and fifth toes;  Surgeon: Nada Libman, MD;  Location[facility] OR;  Service: Vascular;  Laterality: Right;  . AMPUTATION Right 01/19/2019   Procedure: AMPUTATION BELOW KNEE;  Surgeon: Sherren Kerns, MD;  Location: [facility] OR;  Service: Vascular;  Laterality: Right;  . APPLICATION OF WOUND VAC Left 08/27/2017   Procedure: APPLICATION OF WOUND VAC;  Surgeon: Maeola Harman, MD;  Location[facility] OR;   Service: Vascular;  Laterality: Left;  . CARPAL TUNNEL RELEASE Right   . COLONOSCOPY    . LOWER EXTREMITY ANGIOGRAM Right 11/12/2018   Procedure: ANGIOGRAM OF Right LEG;  Surgeon: Nada Libman, MD;  Location: [facility] OR;  Service: Vascular;  Laterality: Right;  . LOWER EXTREMITY INTERVENTION Right 10/09/2017   Procedure: LOWER EXTREMITY INTERVENTION;  Surgeon: Maeola Harman, MD;  Location: [facility] INVASIVE CV LAB;  Service: Cardiovascular;  Laterality: Right;  . TOE AMPUTATION Right 11/12/2018   2ND 3RD & 5TH TOE   . TRANSMETATARSAL AMPUTATION Right 11/19/2018   Procedure: TRANSMETATARSAL AMPUTATION REVISION;  Surgeon: Nada Libman, MD;  Location: [facility] OR;  Service: Vascular;  Laterality: Right;    There were no vitals filed for this visit.   Subjective Assessment - 09/15/20 1300    Subjective She was able to stand 10-12 times with her nephew.    Currently in Pain? Yes    Pain Score 5     Pain Location Hand    Pain Orientation Right    Pain Descriptors / Indicators Pins and needles;Tingling    Pain Type Chronic pain;Neuropathic pain    Pain Onset More than a month ago    Pain Frequency Constant    Aggravating Factors  neuropathic    Pain Relieving Factors meds                             [facility] Adult PT Treatment/Exercise - 09/15/20 1300      Transfers   Transfers Sit to Stand;Stand to [employer] Transfers    Sit to Stand 3: Mod assist;4: Min assist;With upper extremity assist;With armrests;From chair/3-in-1;Other (comment)   to RW with bil. prostheses   Sit to Stand Details Tactile cues for weight shifting;Visual cues for safe use of DME/AE;Verbal cues for sequencing;Verbal cues for technique;Verbal cues for safe use of DME/AE;Manual facilitation for weight bearing;Manual facilitation for weight shifting    Stand to Sit 4: Min assist;With upper extremity assist;With armrests;To chair/3-in-1;Other (comment)   from RW with bil. prostheses   Stand to Sit  Details (indicate cue type and reason) Tactile cues for weight shifting;Visual cues for safe use of DME/AE;Verbal cues for technique;Verbal cues for safe use of DME/AE;Manual facilitation for weight shifting      Ambulation/Gait   Ambulation/Gait Yes    Ambulation/Gait Assistance 3: Mod assist    Ambulation/Gait Assistance Details theraband on RW for visual cues to step width.  verbal & manual/tactile on upright posture and sequence.    Ambulation Distance (Feet) 27 Feet   27   Assistive device Prostheses;Rolling walker    Ambulation Surface Level;Indoor      Neuro Re-ed    Neuro Re-ed Details  standing in //bars: moving UEs opposite directions (simulating UE motion to turn RW);  turning feet 90* right/forward /left / forward  2 sets;  with modA      Prosthetics   Current prosthetic wear tolerance (#hours/day)  3-5hrs 2-3x/day.  PT recommended wearing liner when napping in  recliner and donning prosthesis when getting out of recliner.  Need to remove liner & wear shrinker for 4-6hours /day to enable skin to dry.     Current prosthetic weight-bearing tolerance (hours/day)  No pain or discomfort with standing in //bars.     Edema pitting edema TTA limb    Residual limb condition  right TTA has medium red rash over distal limb but no signs of infection.  bulbous edematous shape.  no open areas.    TFA no issues noted.              Stand up & sit down 2 times 3rd time stand as long as tolerable (time & keep record) Rest >/= 60 seconds 4th stand looking right - middle - left - middle (time & keep record) Rest >/= 60 seconds 5th stand looking up (chin up) forward, look down (chin down) forward (time & keep record) Rest >/= 60 seconds 6th stand turn walker right, straight, left, straight Rest >/= 60 seconds 7th or more back to stand up & sit downs.  Rest >/= 30 seconds between each rep.      PT Education - 09/15/20 1348    Education Details standing HEP with nephew (see pt  instructions)    Person(s) Educated Patient;Caregiver(s)    Methods Explanation;Demonstration;Tactile cues;Verbal cues;Handout    Comprehension Verbalized understanding;Returned demonstration;Verbal cues required;Tactile cues required            PT Short Term Goals - 08/24/20 1645      PT SHORT TERM GOAL #1   Title Patient reports ability to transfer from her recliner to w/c with TTA proshesis.    Time 1    Period Months    Status New    Target Date 09/29/20      PT SHORT TERM GOAL #2   Title Stand pivot transfer with RW between chairs with armrests to right or left with modA    Time 1    Period Months    Status On-going    Target Date 09/29/20      PT SHORT TERM GOAL #3   Title Sit to/form stand w/c to RW with minimal assist    Time 1    Period Months    Status Revised    Target Date 09/29/20      PT SHORT TERM GOAL #4   Title patient ambulates 104' with RW & bilateral prostheses with modA.    Time 1    Period Months    Status Revised    Target Date 09/29/20      PT SHORT TERM GOAL #5   Title --    Baseline --    Time --    Period --    Status --    Target Date --             PT Long Term Goals - 08/24/20 1631      PT LONG TERM GOAL #1   Title Patient tolerates wear of Transtibial prosthesis and Transfemoral liner  >80% of awake hours and Transfemoral prosthesis >5hours total / day without skin or limb pain issues.  (All Target Date 11/24/2020)    Time 3    Period Months    Status On-going    Target Date 11/24/20      PT LONG TERM GOAL #2   Title Patient verbalizes and demonstrates understading of proper prosthetic care including donning both prostheses.    Time 3    Period Months  Status On-going    Target Date 11/24/20      PT LONG TERM GOAL #3   Title sit to / from stand from chairs with armrests to RW and maintains upright 2 minutes with supervision.    Time 3    Period Months    Status Revised    Target Date 11/24/20      PT LONG TERM  GOAL #4   Title Patient ambulates 7' with RW & bilateral prostheses with minA from nephew.    Time 3    Period Months    Status Revised    Target Date 11/24/20                 Plan - 09/15/20 1353    Clinical Impression Statement PT updated standing HEP with nephew with RW & bilateral prostheses.  They both appear to understand HEP.  She improved distance & less assistance for prosthetic gait.    Personal Factors and Comorbidities Comorbidity 3+;Education;Fitness;Past/Current Experience;Social Background;Time since onset of injury/illness/exacerbation    Comorbidities Rt TTA, Lt TFA, DM, HTN, PVD, depression, right Carpal Tunnel Release surgery    Examination-Activity Limitations Locomotion Level;Squat;Stand;Transfers;Stairs    Examination-Participation Restrictions Community Activity    Stability/Clinical Decision Making Evolving/Moderate complexity    Rehab Potential Good    PT Frequency 2x / week    PT Duration 12 weeks    PT Treatment/Interventions ADLs/Self Care Home Management;DME Instruction;Gait training;Stair training;Functional mobility training;Therapeutic activities;Therapeutic exercise;Balance training;Neuromuscular re-education;Patient/family education;Prosthetic Training;Manual techniques;Scar mobilization;Passive range of motion;Vasopneumatic Device;Vestibular    PT Next Visit Plan standing & gait with RW    Consulted and Agree with Plan of Care Patient;Family member/caregiver    Family Member Consulted nephew / caregiver Marikay Alar           Patient will benefit from skilled therapeutic intervention in order to improve the following deficits and impairments:  Abnormal gait,Cardiopulmonary status limiting activity,Decreased activity tolerance,Decreased balance,Decreased endurance,Decreased knowledge of use of DME,Decreased mobility,Decreased range of motion,Decreased skin integrity,Decreased scar mobility,Decreased strength,Dizziness,Increased edema,Impaired  flexibility,Postural dysfunction,Prosthetic Dependency,Obesity,Pain  Visit Diagnosis: Unsteadiness on feet  Muscle weakness (generalized)  Other abnormalities of gait and mobility  Abnormal posture  Pain in right hand  Stiffness of left hip, not elsewhere classified     Problem List Patient Active Problem List   Diagnosis Date Noted  . PTSD (post-traumatic stress disorder)   . Mixed stress and urge urinary incontinence 06/11/2019  . Right below-knee amputee 01/23/2019  . Ulnar neuropathy of left upper extremity 10/23/2018  . Pain in finger of right hand 02/20/2018  . Atherosclerosis of native arteries of the extremities with gangrene 11/01/2017  . OSA (obstructive sleep apnea)   . Benign essential HTN   . DM (diabetes mellitus), secondary, uncontrolled, with peripheral vascular complications   . Hyperkalemia   . Leukocytosis   . Acute blood loss anemia   . Amputation of left lower extremity above knee 08/23/2017  . PAD (peripheral artery disease) 07/23/2017  . Cellulitis 06/10/2017  . Hyponatremia 06/10/2017  . Fever 06/10/2017  . Rash 05/24/2017  . Allergic rhinitis   . Insomnia     Vladimir Faster, PT, DPT 09/15/2020, 1:55 PM  [facility] [address] [place], [place], [address] Phone: [phone]   Fax:  [phone]  Name: ALZINA GOLDA MRN: [id number] Date of Birth: 10/05/1956

## 2020-09-20 ENCOUNTER — Ambulatory Visit (INDEPENDENT_AMBULATORY_CARE_PROVIDER_SITE_OTHER): Payer: Medicare HMO | Admitting: Physical Therapy

## 2020-09-20 ENCOUNTER — Encounter: Payer: Self-pay | Admitting: Physical Therapy

## 2020-09-20 ENCOUNTER — Other Ambulatory Visit: Payer: Self-pay

## 2020-09-20 DIAGNOSIS — M25652 Stiffness of left hip, not elsewhere classified: Secondary | ICD-10-CM | POA: Diagnosis not present

## 2020-09-20 DIAGNOSIS — R293 Abnormal posture: Secondary | ICD-10-CM | POA: Diagnosis not present

## 2020-09-20 DIAGNOSIS — M6281 Muscle weakness (generalized): Secondary | ICD-10-CM | POA: Diagnosis not present

## 2020-09-20 DIAGNOSIS — R2681 Unsteadiness on feet: Secondary | ICD-10-CM

## 2020-09-20 DIAGNOSIS — R2689 Other abnormalities of gait and mobility: Secondary | ICD-10-CM

## 2020-09-20 DIAGNOSIS — M79641 Pain in right hand: Secondary | ICD-10-CM | POA: Diagnosis not present

## 2020-09-20 NOTE — Therapy (Signed)
[facility] [address] [place], [place], [id number] Phone: [phone]   Fax:  [phone]  Physical Therapy Treatment  Patient Details  Name: Danielle Buchanan MRN: [id number] Date of Birth: 09-02-1956 Referring Provider (PT): Shirlean Mylararol Webb, MD   Encounter Date: 09/20/2020   PT End of Session - 09/20/20 1346    Visit Number 28    Number of Visits 52    Date for PT Re-Evaluation 11/18/20    Authorization Type [employer]    Authorization Time Period $10 co-pay,  09/08/2020 - 10/20/2020    Authorization - Visit Number 5    Authorization - Number of Visits 12    Progress Note Due on Visit 33    PT Start Time 1345    PT Stop Time 1430    PT Time Calculation (min) 45 min    Equipment Utilized During Treatment Gait belt    Activity Tolerance Patient tolerated treatment well    Behavior During Therapy Flat affect;WFL for tasks assessed/performed           Past Medical History:  Diagnosis Date  . Allergic rhinitis   . Amputation of left lower extremity above knee 08/23/2017  . Amputation stump infection 08/23/2017  . Atherosclerosis of native arteries of the extremities with gangrene 11/01/2017   2/4-2/15/2020 infected/necrotic third right toe 2/5 transmetatarsal amputation of toes 2-5 with removal of the big toe 2/12 by Dr Myra GianottiBrabham All margins clean; Bactrim prescribed for Serratia osteomyelitis  . DM (diabetes mellitus), secondary, uncontrolled, with peripheral vascular complications    Current 11/12/2018 A1c 10.9%  . Dyslipidemia   . Essential hypertension, benign   . Gangrene of right foot 01/17/2019  . Hyperkalemia   . Hyponatremia 06/10/2017  . Insomnia   . Leukocytosis   . Mixed stress and urge urinary incontinence 06/11/2019  . OSA (obstructive sleep apnea)    no cpap  . Osteomyelitis of third toe of right foot 11/12/2018  . PAD (peripheral artery disease) 07/23/2017  . Pain in finger of right hand 02/20/2018  . PTSD (post-traumatic  stress disorder)   . Right below-knee amputee 01/23/2019  . Ulnar neuropathy of left upper extremity 10/23/2018    Past Surgical History:  Procedure Laterality Date  . ABDOMINAL AORTOGRAM W/LOWER EXTREMITY N/A 06/24/2017   Procedure: ABDOMINAL AORTOGRAM W/LOWER EXTREMITY;  Surgeon: Maeola Harmanain, Brandon Christopher, MD;  [facility];  Service: Cardiovascular;  Laterality: N/A;  . ABDOMINAL AORTOGRAM W/LOWER EXTREMITY Right 10/09/2017   Procedure: ABDOMINAL AORTOGRAM W/LOWER EXTREMITY;  Surgeon: Maeola Harmanain, Brandon Christopher, MD;  [facility];  Service: Cardiovascular;  Laterality: Right;  . AMPUTATION Left 07/23/2017   Procedure: AMPUTATION  BELOW KNEE;  Surgeon: Maeola Harmanain, Brandon Christopher, MD;  [facility];  Service: Vascular;  Laterality: Left;  . AMPUTATION Left 08/12/2017   Procedure: REVISION BELOW KNEE;  Surgeon: Maeola Harmanain, Brandon Christopher, MD;  [facility];  Service: Vascular;  Laterality: Left;  . AMPUTATION Left 08/27/2017   Procedure: left ABOVE KNEE AMPUTATION;  Surgeon: Maeola Harmanain, Brandon Christopher, MD;  [facility];  Service: Vascular;  Laterality: Left;  . AMPUTATION Left 08/30/2017   Procedure: REVISION AMPUTATION ABOVE KNEE;  Surgeon: Nada LibmanBrabham, Vance W, MD;  [facility];  Service: Vascular;  Laterality: Left;  . AMPUTATION Right 11/04/2017   Procedure: AMPUTATION RIGHT FOURTH TOE;  Surgeon: Maeola Harmanain, Brandon Christopher, MD;  [facility];  Service: Vascular;  Laterality: Right;  . AMPUTATION Right 11/12/2018   Procedure: AMPUTATION OF second,  third and fifth toes;  Surgeon: Nada Libman, MD;  Location: [facility] OR;  Service: Vascular;  Laterality: Right;  . AMPUTATION Right 01/19/2019   Procedure: AMPUTATION BELOW KNEE;  Surgeon: Sherren Kerns, MD;  Location: [facility] OR;  Service: Vascular;  Laterality: Right;  . APPLICATION OF WOUND VAC Left 08/27/2017   Procedure: APPLICATION OF WOUND VAC;  Surgeon: Maeola Harman, MD;  Location: [facility] OR;   Service: Vascular;  Laterality: Left;  . CARPAL TUNNEL RELEASE Right   . COLONOSCOPY    . LOWER EXTREMITY ANGIOGRAM Right 11/12/2018   Procedure: ANGIOGRAM OF Right LEG;  Surgeon: Nada Libman, MD;  Location: [facility] OR;  Service: Vascular;  Laterality: Right;  . LOWER EXTREMITY INTERVENTION Right 10/09/2017   Procedure: LOWER EXTREMITY INTERVENTION;  Surgeon: Maeola Harman, MD;  Location[facility] INVASIVE CV LAB;  Service: Cardiovascular;  Laterality: Right;  . TOE AMPUTATION Right 11/12/2018   2ND 3RD & 5TH TOE   . TRANSMETATARSAL AMPUTATION Right 11/19/2018   Procedure: TRANSMETATARSAL AMPUTATION REVISION;  Surgeon: Nada Libman, MD;  Location: [facility] OR;  Service: Vascular;  Laterality: Right;    There were no vitals filed for this visit.   Subjective Assessment - 09/20/20 1352    Subjective She reports doing updated HEP without issues except sat back unexpectedly into w/c.    Patient Stated Goals Walk with 2 prostheses in home mostly and maybe limited community.    Currently in Pain? Yes    Pain Score 10-Worst pain ever    Pain Location Hand    Pain Orientation Right   little finger   Pain Descriptors / Indicators Pins and needles;Tingling    Pain Type Chronic pain;Neuropathic pain    Pain Onset More than a month ago    Pain Frequency Constant                             [facility] Adult PT Treatment/Exercise - 09/20/20 1345      Transfers   Transfers Sit to Stand;Stand to [employer] Transfers    Sit to Stand 3: Mod assist;4: Min assist;With upper extremity assist;With armrests;From chair/3-in-1;Other (comment)   to RW with bil. prostheses   Sit to Stand Details Tactile cues for weight shifting;Visual cues for safe use of DME/AE;Verbal cues for sequencing;Verbal cues for technique;Verbal cues for safe use of DME/AE;Manual facilitation for weight bearing;Manual facilitation for weight shifting    Stand to Sit 4: Min assist;With upper extremity assist;With  armrests;To chair/3-in-1;Other (comment)   from RW with bil. prostheses   Stand to Sit Details (indicate cue type and reason) Tactile cues for weight shifting;Visual cues for safe use of DME/AE;Verbal cues for technique;Verbal cues for safe use of DME/AE;Manual facilitation for weight shifting    Stand Pivot Transfers 3: Mod assist   RW & bil. prostheses   Lateral/Scoot Transfers 5: Supervision;With armrests removed    Lateral/Scoot Transfer Details (indicate cue type and reason) verbal & demo cues on wt shift over TTA prosthesis using to lift buttocks clear of seat prior to pivoting      Ambulation/Gait   Ambulation/Gait Yes    Ambulation/Gait Assistance 3: Mod assist    Ambulation Distance (Feet) 27 Feet   27   Assistive device Prostheses;Rolling walker    Ambulation Surface Level;Indoor      Neuro Re-ed    Neuro Re-ed Details  --      Prosthetics   Current prosthetic  wear tolerance (#hours/day)  3-5hrs 2-3x/day.  PT recommended wearing liner when napping in recliner and donning prosthesis when getting out of recliner.  Need to remove liner & wear shrinker for 4-6hours /day to enable skin to dry.     Current prosthetic weight-bearing tolerance (hours/day)  No pain or discomfort with standing in //bars.     Edema pitting edema TTA limb    Residual limb condition  right TTA has medium red rash over distal limb but no signs of infection.  bulbous edematous shape.  no open areas.    TFA no issues noted.                     PT Short Term Goals - 08/24/20 1645      PT SHORT TERM GOAL #1   Title Patient reports ability to transfer from her recliner to w/c with TTA proshesis.    Time 1    Period Months    Status New    Target Date 09/29/20      PT SHORT TERM GOAL #2   Title Stand pivot transfer with RW between chairs with armrests to right or left with modA    Time 1    Period Months    Status On-going    Target Date 09/29/20      PT SHORT TERM GOAL #3   Title Sit to/form  stand w/c to RW with minimal assist    Time 1    Period Months    Status Revised    Target Date 09/29/20      PT SHORT TERM GOAL #4   Title patient ambulates 69' with RW & bilateral prostheses with modA.    Time 1    Period Months    Status Revised    Target Date 09/29/20      PT SHORT TERM GOAL #5   Title --    Baseline --    Time --    Period --    Status --    Target Date --             PT Long Term Goals - 08/24/20 1631      PT LONG TERM GOAL #1   Title Patient tolerates wear of Transtibial prosthesis and Transfemoral liner  >80% of awake hours and Transfemoral prosthesis >5hours total / day without skin or limb pain issues.  (All Target Date 11/24/2020)    Time 3    Period Months    Status On-going    Target Date 11/24/20      PT LONG TERM GOAL #2   Title Patient verbalizes and demonstrates understading of proper prosthetic care including donning both prostheses.    Time 3    Period Months    Status On-going    Target Date 11/24/20      PT LONG TERM GOAL #3   Title sit to / from stand from chairs with armrests to RW and maintains upright 2 minutes with supervision.    Time 3    Period Months    Status Revised    Target Date 11/24/20      PT LONG TERM GOAL #4   Title Patient ambulates 65' with RW & bilateral prostheses with minA from nephew.    Time 3    Period Months    Status Revised    Target Date 11/24/20                 Plan -  09/20/20 1347    Clinical Impression Statement PT session worked on squat pivot with clearance of buttocks & stand pivot with turning 90* which was improved from previous sessions.    Personal Factors and Comorbidities Comorbidity 3+;Education;Fitness;Past/Current Experience;Social Background;Time since onset of injury/illness/exacerbation    Comorbidities Rt TTA, Lt TFA, DM, HTN, PVD, depression, right Carpal Tunnel Release surgery    Examination-Activity Limitations Locomotion Level;Squat;Stand;Transfers;Stairs     Examination-Participation Restrictions Community Activity    Stability/Clinical Decision Making Evolving/Moderate complexity    Rehab Potential Good    PT Frequency 2x / week    PT Duration 12 weeks    PT Treatment/Interventions ADLs/Self Care Home Management;DME Instruction;Gait training;Stair training;Functional mobility training;Therapeutic activities;Therapeutic exercise;Balance training;Neuromuscular re-education;Patient/family education;Prosthetic Training;Manual techniques;Scar mobilization;Passive range of motion;Vasopneumatic Device;Vestibular    PT Next Visit Plan standing & gait with RW    Consulted and Agree with Plan of Care Patient;Family member/caregiver    Family Member Consulted nephew / caregiver Marikay Alar           Patient will benefit from skilled therapeutic intervention in order to improve the following deficits and impairments:  Abnormal gait,Cardiopulmonary status limiting activity,Decreased activity tolerance,Decreased balance,Decreased endurance,Decreased knowledge of use of DME,Decreased mobility,Decreased range of motion,Decreased skin integrity,Decreased scar mobility,Decreased strength,Dizziness,Increased edema,Impaired flexibility,Postural dysfunction,Prosthetic Dependency,Obesity,Pain  Visit Diagnosis: Muscle weakness (generalized)  Unsteadiness on feet  Other abnormalities of gait and mobility  Abnormal posture  Pain in right hand  Stiffness of left hip, not elsewhere classified     Problem List Patient Active Problem List   Diagnosis Date Noted  . PTSD (post-traumatic stress disorder)   . Mixed stress and urge urinary incontinence 06/11/2019  . Right below-knee amputee 01/23/2019  . Ulnar neuropathy of left upper extremity 10/23/2018  . Pain in finger of right hand 02/20/2018  . Atherosclerosis of native arteries of the extremities with gangrene 11/01/2017  . OSA (obstructive sleep apnea)   . Benign essential HTN   . DM (diabetes  mellitus), secondary, uncontrolled, with peripheral vascular complications   . Hyperkalemia   . Leukocytosis   . Acute blood loss anemia   . Amputation of left lower extremity above knee 08/23/2017  . PAD (peripheral artery disease) 07/23/2017  . Cellulitis 06/10/2017  . Hyponatremia 06/10/2017  . Fever 06/10/2017  . Rash 05/24/2017  . Allergic rhinitis   . Insomnia     Vladimir Faster, PT, DPT 09/20/2020, 3:06 PM  [facility] Therapy [address] [place], [place], [address] Phone: [phone]   Fax:  [phone]  Name: Danielle Buchanan MRN: [id number] Date of Birth: [birth year]

## 2020-09-26 ENCOUNTER — Encounter: Payer: Medicare HMO | Admitting: Physical Therapy

## 2020-09-28 ENCOUNTER — Other Ambulatory Visit: Payer: Self-pay

## 2020-09-28 ENCOUNTER — Ambulatory Visit: Payer: Medicare HMO | Admitting: Physical Therapy

## 2020-09-28 ENCOUNTER — Encounter: Payer: Self-pay | Admitting: Physical Therapy

## 2020-09-28 DIAGNOSIS — R2689 Other abnormalities of gait and mobility: Secondary | ICD-10-CM | POA: Diagnosis not present

## 2020-09-28 DIAGNOSIS — M79641 Pain in right hand: Secondary | ICD-10-CM

## 2020-09-28 DIAGNOSIS — M25652 Stiffness of left hip, not elsewhere classified: Secondary | ICD-10-CM | POA: Diagnosis not present

## 2020-09-28 DIAGNOSIS — R2681 Unsteadiness on feet: Secondary | ICD-10-CM

## 2020-09-28 DIAGNOSIS — M6281 Muscle weakness (generalized): Secondary | ICD-10-CM

## 2020-09-28 DIAGNOSIS — R293 Abnormal posture: Secondary | ICD-10-CM

## 2020-09-28 NOTE — Therapy (Signed)
[facility] Physical Therapy [address] [place], [place], [address] Phone: [phone]   Fax:  [phone]  Physical Therapy Treatment  Patient Details  Name: Danielle Buchanan MRN: [id number] Date of Birth: 1956/04/10 Referring Provider (PT): Maurice Small, MD   Encounter Date: 09/28/2020   PT End of Session - 09/28/20 2376    Visit Number 29    Number of Visits 17    Date for PT Re-Evaluation 11/18/20    Authorization Type [employer]    Authorization Time Period $10 co-pay,  09/08/2020 - 10/20/2020    Authorization - Visit Number 6    Authorization - Number of Visits 12    Progress Note Due on Visit 75    PT Start Time 1300    PT Stop Time 2831    PT Time Calculation (min) 45 min    Equipment Utilized During Treatment Gait belt    Activity Tolerance Patient tolerated treatment well    Behavior During Therapy Flat affect;WFL for tasks assessed/performed           Past Medical History:  Diagnosis Date   Allergic rhinitis    Amputation of left lower extremity above knee 08/23/2017   Amputation stump infection 08/23/2017   Atherosclerosis of native arteries of the extremities with gangrene 11/01/2017   2/4-2/15/2020 infected/necrotic third right toe 2/5 transmetatarsal amputation of toes 2-5 with removal of the big toe 2/12 by Dr Trula Slade All margins clean; Bactrim prescribed for Serratia osteomyelitis   DM (diabetes mellitus), secondary, uncontrolled, with peripheral vascular complications    Current 11/12/2018 A1c 10.9%   Dyslipidemia    Essential hypertension, benign    Gangrene of right foot 01/17/2019   Hyperkalemia    Hyponatremia 06/10/2017   Insomnia    Leukocytosis    Mixed stress and urge urinary incontinence 06/11/2019   OSA (obstructive sleep apnea)    no cpap   Osteomyelitis of third toe of right foot 11/12/2018   PAD (peripheral artery disease) 07/23/2017   Pain in finger of right hand 02/20/2018   PTSD (post-traumatic  stress disorder)    Right below-knee amputee 01/23/2019   Ulnar neuropathy of left upper extremity 10/23/2018    Past Surgical History:  Procedure Laterality Date   ABDOMINAL AORTOGRAM W/LOWER EXTREMITY N/A 06/24/2017   Procedure: ABDOMINAL AORTOGRAM W/LOWER EXTREMITY;  Surgeon: Waynetta Sandy, MD;  Location: [place] [facility];  Service: Cardiovascular;  Laterality: N/A;   ABDOMINAL AORTOGRAM W/LOWER EXTREMITY Right 10/09/2017   Procedure: ABDOMINAL AORTOGRAM W/LOWER EXTREMITY;  Surgeon: Waynetta Sandy, MD;  Location: [place] [facility];  Service: Cardiovascular;  Laterality: Right;   AMPUTATION Left 07/23/2017   Procedure: AMPUTATION  BELOW KNEE;  Surgeon: Waynetta Sandy, MD;  Location: [place];  Service: Vascular;  Laterality: Left;   AMPUTATION Left 08/12/2017   Procedure: REVISION BELOW KNEE;  Surgeon: Waynetta Sandy, MD;  Location: [place];  Service: Vascular;  Laterality: Left;   AMPUTATION Left 08/27/2017   Procedure: left ABOVE KNEE AMPUTATION;  Surgeon: Waynetta Sandy, MD;  Location: [place];  Service: Vascular;  Laterality: Left;   AMPUTATION Left 08/30/2017   Procedure: REVISION AMPUTATION ABOVE KNEE;  Surgeon: Serafina Mitchell, MD;  Location: [place];  Service: Vascular;  Laterality: Left;   AMPUTATION Right 11/04/2017   Procedure: AMPUTATION RIGHT FOURTH TOE;  Surgeon: Waynetta Sandy, MD;  Location: [place];  Service: Vascular;  Laterality: Right;   AMPUTATION Right 11/12/2018   Procedure: AMPUTATION OF second,  third and fifth toes;  Surgeon: Serafina Mitchell, MD;  Location: [place];  Service: Vascular;  Laterality: Right;   AMPUTATION Right 01/19/2019   Procedure: AMPUTATION BELOW KNEE;  Surgeon: Elam Dutch, MD;  Location: [place];  Service: Vascular;  Laterality: Right;   APPLICATION OF WOUND VAC Left 08/27/2017   Procedure: APPLICATION OF WOUND VAC;  Surgeon: Waynetta Sandy, MD;  Location: [place];   Service: Vascular;  Laterality: Left;   CARPAL TUNNEL RELEASE Right    COLONOSCOPY     LOWER EXTREMITY ANGIOGRAM Right 11/12/2018   Procedure: Cyril Loosen OF Right LEG;  Surgeon: Serafina Mitchell, MD;  Location: [place];  Service: Vascular;  Laterality: Right;   LOWER EXTREMITY INTERVENTION Right 10/09/2017   Procedure: LOWER EXTREMITY INTERVENTION;  Surgeon: Waynetta Sandy, MD;  Location: [place] [facility];  Service: Cardiovascular;  Laterality: Right;   TOE AMPUTATION Right 11/12/2018   2ND 3RD & 5TH TOE    TRANSMETATARSAL AMPUTATION Right 11/19/2018   Procedure: TRANSMETATARSAL AMPUTATION REVISION;  Surgeon: Serafina Mitchell, MD;  Location: [place];  Service: Vascular;  Laterality: Right;    There were no vitals filed for this visit.   Subjective Assessment - 09/28/20 1257    Subjective She has been standing with RW with nephew except 2 days. She was too weak to stand those 2 days. Her blood sugar was high due to poor food choices.    Patient Stated Goals Walk with 2 prostheses in home mostly and maybe limited community.    Currently in Pain? Yes    Pain Score 6     Pain Location Hand    Pain Orientation Right;Left    Pain Descriptors / Indicators Pins and needles;Tingling    Pain Type Chronic pain;Neuropathic pain    Pain Onset More than a month ago    Pain Frequency Constant    Aggravating Factors  using hands    Pain Relieving Factors meds                             [facility] Adult PT Treatment/Exercise - 09/28/20 1257      Transfers   Transfers Sit to Stand;Stand to Sit;Stand Pivot Transfers    Sit to Stand 3: Mod assist;4: Min assist;With upper extremity assist;With armrests;From chair/3-in-1;Other (comment)   to RW with bil. prostheses   Sit to Stand Details Tactile cues for weight shifting;Visual cues for safe use of DME/AE;Verbal cues for sequencing;Verbal cues for technique;Verbal cues for safe use of DME/AE;Manual facilitation for weight  bearing;Manual facilitation for weight shifting    Stand to Sit 4: Min assist;With upper extremity assist;With armrests;To chair/3-in-1;Other (comment)   from RW with bil. prostheses   Stand to Sit Details (indicate cue type and reason) Tactile cues for weight shifting;Visual cues for safe use of DME/AE;Verbal cues for technique;Verbal cues for safe use of DME/AE;Manual facilitation for weight shifting    Stand Pivot Transfers 3: Mod assist   RW & bil. prostheses   Lateral/Scoot Transfers 5: Supervision;With armrests removed    Lateral/Scoot Transfer Details (indicate cue type and reason) verbal & demo cues on wt shift over TTA prosthesis using to lift buttocks clear of seat prior to pivoting.  Pt to work on using TTA prosthesis to lift buttocks off w/c when alone with counter or table in front of her.  When nephew is present he is to help her work on transferring w/c to/from  recliner using TTA prosthesis. Both verbalized understanding.      Ambulation/Gait   Ambulation/Gait Yes    Ambulation/Gait Assistance 3: Mod assist    Ambulation/Gait Assistance Details tactile / manual & verbal cues on RW movement, wt shift over stance limb, upright posture & colored bands for step width.    Ambulation Distance (Feet) 15 Feet   5' X 2 with 69* turn to sit, 15' w/nephew & PT assist, 105' with PT only assist   Assistive device Prostheses;Rolling walker    Ambulation Surface Level;Indoor    Gait Comments PT instructed nephew in technique for assisting gait.  He assisted one gait with PT also assisting on her left side with 3rd person following with w/c.  He is not ready to assist outside of PT but PT is beginning to initate the training.      Prosthetics   Prosthetic Care Comments  need to check TTA prosthesis for slippage prior to standing or walking.    Current prosthetic wear tolerance (#hours/day)  3-5hrs 2-3x/day.  PT recommended wearing liner when napping in recliner and donning prosthesis when getting out  of recliner.  Need to remove liner & wear shrinker for 4-6hours /day to enable skin to dry.     Current prosthetic weight-bearing tolerance (hours/day)  No pain or discomfort with standing in //bars.     Edema pitting edema TTA limb    Residual limb condition  right TTA has medium red rash over distal limb but no signs of infection.  bulbous edematous shape.  no open areas.    TFA no issues noted.     Education Provided Other (comment);Proper Donning   see prosthetic care comments   Person(s) Educated Patient;Caregiver(s)    Education Method Explanation;Verbal cues    Education Method Verbalized understanding;Verbal cues required;Needs further instruction                    PT Short Term Goals - 09/28/20 1358      PT SHORT TERM GOAL #1   Title Patient reports ability to transfer from her recliner to w/c with TTA proshesis.    Baseline NOT MET 09/28/2020 Pt reports she is confused on how to transfer with TTA prosthesis so she just does it the way she has been doing it.    Time 1    Period Months    Status New    Target Date 09/29/20      PT SHORT TERM GOAL #2   Title Stand pivot transfer with RW between chairs with armrests to right or left with modA    Baseline MET 09/28/2020    Time 1    Period Months    Status Achieved    Target Date 09/29/20      PT SHORT TERM GOAL #3   Title Sit to/form stand w/c to RW with minimal assist    Baseline Partially MET 09/28/2020 She is inconsistent requiring minA then modA other times.    Time 1    Period Months    Status Partially Met    Target Date 09/29/20      PT SHORT TERM GOAL #4   Title patient ambulates 4' with RW & bilateral prostheses with modA.    Baseline MET 09/28/2020    Time 1    Period Months    Status Achieved    Target Date 09/29/20             PT Long Term Goals - 08/24/20  [place] #1   Title Patient tolerates wear of Transtibial prosthesis and Transfemoral liner  >80% of awake  hours and Transfemoral prosthesis >5hours total / day without skin or limb pain issues.  (All Target Date 11/24/2020)    Time 3    Period Months    Status On-going    Target Date 11/24/20      PT LONG TERM GOAL #2   Title Patient verbalizes and demonstrates understading of proper prosthetic care including donning both prostheses.    Time 3    Period Months    Status On-going    Target Date 11/24/20      PT LONG TERM GOAL #3   Title sit to / from stand from chairs with armrests to RW and maintains upright 2 minutes with supervision.    Time 3    Period Months    Status Revised    Target Date 11/24/20      PT LONG TERM GOAL #4   Title Patient ambulates 72' with RW & bilateral prostheses with minA from nephew.    Time 3    Period Months    Status Revised    Target Date 11/24/20                 Plan - 09/28/20 1256    Clinical Impression Statement PT reviewed scooting / squat pivot transfers using TTA prosthesis and established more solid plan for carryover.  PT began training nephew in assisting gait when another person can follow closely with w/c. He is not ready to attempt outside of PT.    Personal Factors and Comorbidities Comorbidity 3+;Education;Fitness;Past/Current Experience;Social Background;Time since onset of injury/illness/exacerbation    Comorbidities Rt TTA, Lt TFA, DM, HTN, PVD, depression, right Carpal Tunnel Release surgery    Examination-Activity Limitations Locomotion Level;Squat;Stand;Transfers;Stairs    Examination-Participation Restrictions Community Activity    Stability/Clinical Decision Making Evolving/Moderate complexity    Rehab Potential Good    PT Frequency 2x / week    PT Duration 12 weeks    PT Treatment/Interventions ADLs/Self Care Home Management;DME Instruction;Gait training;Stair training;Functional mobility training;Therapeutic activities;Therapeutic exercise;Balance training;Neuromuscular re-education;Patient/family education;Prosthetic  Training;Manual techniques;Scar mobilization;Passive range of motion;Vasopneumatic Device;Vestibular    PT Next Visit Plan standing & gait with RW    Consulted and Agree with Plan of Care Patient;Family member/caregiver    Family Member Consulted nephew / caregiver Mora Bellman           Patient will benefit from skilled therapeutic intervention in order to improve the following deficits and impairments:  Abnormal gait,Cardiopulmonary status limiting activity,Decreased activity tolerance,Decreased balance,Decreased endurance,Decreased knowledge of use of DME,Decreased mobility,Decreased range of motion,Decreased skin integrity,Decreased scar mobility,Decreased strength,Dizziness,Increased edema,Impaired flexibility,Postural dysfunction,Prosthetic Dependency,Obesity,Pain  Visit Diagnosis: Muscle weakness (generalized)  Unsteadiness on feet  Other abnormalities of gait and mobility  Abnormal posture  Pain in right hand  Stiffness of left hip, not elsewhere classified     Problem List Patient Active Problem List   Diagnosis Date Noted   PTSD (post-traumatic stress disorder)    Mixed stress and urge urinary incontinence 06/11/2019   Right below-knee amputee 01/23/2019   Ulnar neuropathy of left upper extremity 10/23/2018   Pain in finger of right hand 02/20/2018   Atherosclerosis of native arteries of the extremities with gangrene 11/01/2017   OSA (obstructive sleep apnea)    Benign essential HTN    DM (diabetes mellitus), secondary, uncontrolled, with peripheral vascular complications    Hyperkalemia  Leukocytosis    Acute blood loss anemia    Amputation of left lower extremity above knee 08/23/2017   PAD (peripheral artery disease) 07/23/2017   Cellulitis 06/10/2017   Hyponatremia 06/10/2017   Fever 06/10/2017   Rash 05/24/2017   Allergic rhinitis    Insomnia     Jamey Reas PT, DPT 09/28/2020, 2:03 PM  [facility]  Therapy [address] [place], [place], [address] Phone: [phone]   Fax:  [phone]  Name: Danielle Buchanan MRN: [id number] Date of Birth: Jan 27, 1956

## 2020-10-05 ENCOUNTER — Encounter: Payer: Self-pay | Admitting: Physical Therapy

## 2020-10-05 ENCOUNTER — Other Ambulatory Visit: Payer: Self-pay

## 2020-10-05 ENCOUNTER — Ambulatory Visit: Payer: Medicare HMO | Admitting: Physical Therapy

## 2020-10-05 DIAGNOSIS — M25652 Stiffness of left hip, not elsewhere classified: Secondary | ICD-10-CM | POA: Diagnosis not present

## 2020-10-05 DIAGNOSIS — M79641 Pain in right hand: Secondary | ICD-10-CM

## 2020-10-05 DIAGNOSIS — R293 Abnormal posture: Secondary | ICD-10-CM

## 2020-10-05 DIAGNOSIS — M6281 Muscle weakness (generalized): Secondary | ICD-10-CM

## 2020-10-05 DIAGNOSIS — R2689 Other abnormalities of gait and mobility: Secondary | ICD-10-CM

## 2020-10-05 DIAGNOSIS — R2681 Unsteadiness on feet: Secondary | ICD-10-CM

## 2020-10-05 NOTE — Therapy (Signed)
[facility] [address] [place], [place], [id number] Phone: [phone]   Fax:  [phone]  Physical Therapy Treatment  Patient Details  Name: Aldine ContesJanice M Sena MRN: [id number] Date of Birth: 04-Jan-1956 Referring Provider (PT): Shirlean Mylararol Webb, MD   Encounter Date: 10/05/2020   PT End of Session - 10/05/20 1351    Visit Number 30    Number of Visits 52    Date for PT Re-Evaluation 11/18/20    Authorization Type [employer]    Authorization Time Period $10 co-pay,  09/08/2020 - 10/20/2020    Authorization - Visit Number 7    Authorization - Number of Visits 12    Progress Note Due on Visit 33    PT Start Time 1345    PT Stop Time 1430    PT Time Calculation (min) 45 min    Equipment Utilized During Treatment Gait belt    Activity Tolerance Patient tolerated treatment well    Behavior During Therapy Flat affect;WFL for tasks assessed/performed           Past Medical History:  Diagnosis Date  . Allergic rhinitis   . Amputation of left lower extremity above knee 08/23/2017  . Amputation stump infection 08/23/2017  . Atherosclerosis of native arteries of the extremities with gangrene 11/01/2017   2/4-2/15/2020 infected/necrotic third right toe 2/5 transmetatarsal amputation of toes 2-5 with removal of the big toe 2/12 by Dr Myra GianottiBrabham All margins clean; Bactrim prescribed for Serratia osteomyelitis  . DM (diabetes mellitus), secondary, uncontrolled, with peripheral vascular complications    Current 11/12/2018 A1c 10.9%  . Dyslipidemia   . Essential hypertension, benign   . Gangrene of right foot 01/17/2019  . Hyperkalemia   . Hyponatremia 06/10/2017  . Insomnia   . Leukocytosis   . Mixed stress and urge urinary incontinence 06/11/2019  . OSA (obstructive sleep apnea)    no cpap  . Osteomyelitis of third toe of right foot 11/12/2018  . PAD (peripheral artery disease) 07/23/2017  . Pain in finger of right hand 02/20/2018  . PTSD (post-traumatic  stress disorder)   . Right below-knee amputee 01/23/2019  . Ulnar neuropathy of left upper extremity 10/23/2018    Past Surgical History:  Procedure Laterality Date  . ABDOMINAL AORTOGRAM W/LOWER EXTREMITY N/A 06/24/2017   Procedure: ABDOMINAL AORTOGRAM W/LOWER EXTREMITY;  Surgeon: Maeola Harmanain, Brandon Christopher, MD;  [facility];  Service: Cardiovascular;  Laterality: N/A;  . ABDOMINAL AORTOGRAM W/LOWER EXTREMITY Right 10/09/2017   Procedure: ABDOMINAL AORTOGRAM W/LOWER EXTREMITY;  Surgeon: Maeola Harmanain, Brandon Christopher, MD;  [facility];  Service: Cardiovascular;  Laterality: Right;  . AMPUTATION Left 07/23/2017   Procedure: AMPUTATION  BELOW KNEE;  Surgeon: Maeola Harmanain, Brandon Christopher, MD;  [facility];  Service: Vascular;  Laterality: Left;  . AMPUTATION Left 08/12/2017   Procedure: REVISION BELOW KNEE;  Surgeon: Maeola Harmanain, Brandon Christopher, MD;  [facility];  Service: Vascular;  Laterality: Left;  . AMPUTATION Left 08/27/2017   Procedure: left ABOVE KNEE AMPUTATION;  Surgeon: Maeola Harmanain, Brandon Christopher, MD;  [facility];  Service: Vascular;  Laterality: Left;  . AMPUTATION Left 08/30/2017   Procedure: REVISION AMPUTATION ABOVE KNEE;  Surgeon: Nada LibmanBrabham, Vance W, MD;  [facility];  Service: Vascular;  Laterality: Left;  . AMPUTATION Right 11/04/2017   Procedure: AMPUTATION RIGHT FOURTH TOE;  Surgeon: Maeola Harmanain, Brandon Christopher, MD;  [facility];  Service: Vascular;  Laterality: Right;  . AMPUTATION Right 11/12/2018   Procedure: AMPUTATION OF second,  third and fifth toes;  Surgeon: Nada Libman, MD;  Location: [facility];  Service: Vascular;  Laterality: Right;  . AMPUTATION Right 01/19/2019   Procedure: AMPUTATION BELOW KNEE;  Surgeon: Sherren Kerns, MD;  Location: [facility] OR;  Service: Vascular;  Laterality: Right;  . APPLICATION OF WOUND VAC Left 08/27/2017   Procedure: APPLICATION OF WOUND VAC;  Surgeon: Maeola Harman, MD;  Location: [facility] OR;   Service: Vascular;  Laterality: Left;  . CARPAL TUNNEL RELEASE Right   . COLONOSCOPY    . LOWER EXTREMITY ANGIOGRAM Right 11/12/2018   Procedure: ANGIOGRAM OF Right LEG;  Surgeon: Nada Libman, MD;  Location: [facility] OR;  Service: Vascular;  Laterality: Right;  . LOWER EXTREMITY INTERVENTION Right 10/09/2017   Procedure: LOWER EXTREMITY INTERVENTION;  Surgeon: Maeola Harman, MD;  Location: [facility] INVASIVE CV LAB;  Service: Cardiovascular;  Laterality: Right;  . TOE AMPUTATION Right 11/12/2018   2ND 3RD & 5TH TOE   . TRANSMETATARSAL AMPUTATION Right 11/19/2018   Procedure: TRANSMETATARSAL AMPUTATION REVISION;  Surgeon: Nada Libman, MD;  Location[facility] OR;  Service: Vascular;  Laterality: Right;    There were no vitals filed for this visit.   Subjective Assessment - 10/05/20 1345    Subjective She has been working on lifting buttocks off chair using TTA prosthesis & squat pivot transfers with nephew.  She thinks that she had flu for 2 days.    Patient Stated Goals Walk with 2 prostheses in home mostly and maybe limited community.    Currently in Pain? Yes    Pain Score 7     Pain Location Hand    Pain Orientation Right;Left;Lateral    Pain Descriptors / Indicators Pins and needles;Tingling    Pain Type Chronic pain;Neuropathic pain    Pain Onset More than a month ago    Pain Frequency Constant    Aggravating Factors  neuropathy    Pain Relieving Factors meds & rest                             [facility] Adult PT Treatment/Exercise - 10/05/20 1345      Transfers   Transfers Sit to Stand;Stand to [employer] Transfers    Sit to Stand 3: Mod assist;4: Min assist;With upper extremity assist;With armrests;From chair/3-in-1;Other (comment)   to RW with bil. prostheses   Sit to Stand Details Tactile cues for weight shifting;Visual cues for safe use of DME/AE;Verbal cues for sequencing;Verbal cues for technique;Verbal cues for safe use of DME/AE;Manual facilitation for  weight bearing;Manual facilitation for weight shifting    Stand to Sit 4: Min assist;With upper extremity assist;With armrests;To chair/3-in-1;Other (comment)   from RW with bil. prostheses   Stand to Sit Details (indicate cue type and reason) Tactile cues for weight shifting;Visual cues for safe use of DME/AE;Verbal cues for technique;Verbal cues for safe use of DME/AE;Manual facilitation for weight shifting    Stand Pivot Transfers 3: Mod assist   RW & bil. prostheses   Lateral/Scoot Transfers 5: Supervision;With armrests removed      Ambulation/Gait   Ambulation/Gait Yes    Ambulation/Gait Assistance 3: Mod assist    Ambulation/Gait Assistance Details PT verbal & demo on technique to assist patient. Nephew reports that he is not comfortable assisting her yet.    Ambulation Distance (Feet) 15 Feet   10' X 2 with 90* turn to sit, 25' X 1 straight path   Assistive device  Prostheses;Rolling walker    Ambulation Surface Level;Indoor    Gait Comments PT verbal cues while PT assisting pt on techniques being used.      Knee/Hip Exercises: Aerobic   Nustep BUEs & RLE (TTA) level 5 for 8 minutes      Knee/Hip Exercises: Seated   Other Seated Knee/Hip Exercises modified leg press with blue doubled theraband tied to w/c armrests 20 reps 2 sets.  PT instructed as HEP & she verbalized / return demo understanding.      Prosthetics   Prosthetic Care Comments  need to check TTA prosthesis for slippage prior to standing or walking.    Current prosthetic wear tolerance (#hours/day)  3-5hrs 2-3x/day.  PT recommended wearing liner when napping in recliner and donning prosthesis when getting out of recliner.  Need to remove liner & wear shrinker for 4-6hours /day to enable skin to dry.     Current prosthetic weight-bearing tolerance (hours/day)  No pain or discomfort with standing in //bars.     Edema pitting edema TTA limb    Residual limb condition  right TTA has medium red rash over distal limb but no  signs of infection.  bulbous edematous shape.  no open areas.    TFA no issues noted.     Education Provided Other (comment);Proper Donning   see prosthetic care comments                   PT Short Term Goals - 10/05/20 1649      PT SHORT TERM GOAL #1   Title Patient reports ability to transfer from her recliner to w/c with TTA proshesis.    Time 1    Period Months    Status On-going    Target Date 10/28/20      PT SHORT TERM GOAL #2   Title Stand pivot transfer with RW between chairs with armrests to right or left with min A    Time 1    Period Months    Status Revised    Target Date 10/28/20      PT SHORT TERM GOAL #3   Title Sit to/from stand 5 reps w/c to RW with minimal assist    Baseline .    Time 1    Period Months    Status Revised    Target Date 10/28/20      PT SHORT TERM GOAL #4   Title patient ambulates 28' with RW & bilateral prostheses with minA.    Time 1    Period Months    Status Revised    Target Date 10/28/20             PT Long Term Goals - 08/24/20 1631      PT LONG TERM GOAL #1   Title Patient tolerates wear of Transtibial prosthesis and Transfemoral liner  >80% of awake hours and Transfemoral prosthesis >5hours total / day without skin or limb pain issues.  (All Target Date 11/24/2020)    Time 3    Period Months    Status On-going    Target Date 11/24/20      PT LONG TERM GOAL #2   Title Patient verbalizes and demonstrates understading of proper prosthetic care including donning both prostheses.    Time 3    Period Months    Status On-going    Target Date 11/24/20      PT LONG TERM GOAL #3   Title sit to / from stand from chairs  with armrests to RW and maintains upright 2 minutes with supervision.    Time 3    Period Months    Status Revised    Target Date 11/24/20      PT LONG TERM GOAL #4   Title Patient ambulates 72' with RW & bilateral prostheses with minA from nephew.    Time 3    Period Months    Status Revised     Target Date 11/24/20                 Plan - 10/05/20 1352    Clinical Impression Statement Patient improved her scooting / squat pivot transfers with TTA prosthesis only.  She continues to struggle with turning 90* to position to sit.    Personal Factors and Comorbidities Comorbidity 3+;Education;Fitness;Past/Current Experience;Social Background;Time since onset of injury/illness/exacerbation    Comorbidities Rt TTA, Lt TFA, DM, HTN, PVD, depression, right Carpal Tunnel Release surgery    Examination-Activity Limitations Locomotion Level;Squat;Stand;Transfers;Stairs    Examination-Participation Restrictions Community Activity    Stability/Clinical Decision Making Evolving/Moderate complexity    Rehab Potential Good    PT Frequency 2x / week    PT Duration 12 weeks    PT Treatment/Interventions ADLs/Self Care Home Management;DME Instruction;Gait training;Stair training;Functional mobility training;Therapeutic activities;Therapeutic exercise;Balance training;Neuromuscular re-education;Patient/family education;Prosthetic Training;Manual techniques;Scar mobilization;Passive range of motion;Vasopneumatic Device;Vestibular    PT Next Visit Plan standing & gait with RW, work towards updated STGs.    Consulted and Agree with Plan of Care Patient;Family member/caregiver    Family Member Consulted nephew / caregiver Marikay Alar           Patient will benefit from skilled therapeutic intervention in order to improve the following deficits and impairments:  Abnormal gait,Cardiopulmonary status limiting activity,Decreased activity tolerance,Decreased balance,Decreased endurance,Decreased knowledge of use of DME,Decreased mobility,Decreased range of motion,Decreased skin integrity,Decreased scar mobility,Decreased strength,Dizziness,Increased edema,Impaired flexibility,Postural dysfunction,Prosthetic Dependency,Obesity,Pain  Visit Diagnosis: Muscle weakness (generalized)  Unsteadiness on  feet  Other abnormalities of gait and mobility  Abnormal posture  Pain in right hand  Stiffness of left hip, not elsewhere classified     Problem List Patient Active Problem List   Diagnosis Date Noted  . PTSD (post-traumatic stress disorder)   . Mixed stress and urge urinary incontinence 06/11/2019  . Right below-knee amputee 01/23/2019  . Ulnar neuropathy of left upper extremity 10/23/2018  . Pain in finger of right hand 02/20/2018  . Atherosclerosis of native arteries of the extremities with gangrene 11/01/2017  . OSA (obstructive sleep apnea)   . Benign essential HTN   . DM (diabetes mellitus), secondary, uncontrolled, with peripheral vascular complications   . Hyperkalemia   . Leukocytosis   . Acute blood loss anemia   . Amputation of left lower extremity above knee 08/23/2017  . PAD (peripheral artery disease) 07/23/2017  . Cellulitis 06/10/2017  . Hyponatremia 06/10/2017  . Fever 06/10/2017  . Rash 05/24/2017  . Allergic rhinitis   . Insomnia     Vladimir Faster, PT, DPT 10/05/2020, 4:51 PM  [facility] Therapy [address] [place], [place], [address] Phone: [phone]   Fax:  [phone]  Name: JOHNETTA SLONIKER MRN: [id number] Date of Birth: 08-14-1956

## 2020-10-06 ENCOUNTER — Ambulatory Visit: Payer: Medicare HMO | Admitting: Physical Therapy

## 2020-10-06 ENCOUNTER — Encounter: Payer: Self-pay | Admitting: Physical Therapy

## 2020-10-06 DIAGNOSIS — M79641 Pain in right hand: Secondary | ICD-10-CM

## 2020-10-06 DIAGNOSIS — M25652 Stiffness of left hip, not elsewhere classified: Secondary | ICD-10-CM | POA: Diagnosis not present

## 2020-10-06 DIAGNOSIS — R293 Abnormal posture: Secondary | ICD-10-CM | POA: Diagnosis not present

## 2020-10-06 DIAGNOSIS — M6281 Muscle weakness (generalized): Secondary | ICD-10-CM | POA: Diagnosis not present

## 2020-10-06 DIAGNOSIS — R2689 Other abnormalities of gait and mobility: Secondary | ICD-10-CM | POA: Diagnosis not present

## 2020-10-06 DIAGNOSIS — R2681 Unsteadiness on feet: Secondary | ICD-10-CM

## 2020-10-06 NOTE — Therapy (Signed)
[facility] Therapy [address] [place], [place], [address] Phone: [phone]   Fax:  [phone]  Physical Therapy Treatment  Patient Details  Name: Danielle Buchanan MRN: [id number] Date of Birth: 1956/04/30 Referring Provider (PT): [name], MD   Encounter Date: 10/06/2020   PT End of Session - 10/06/20 1258    Visit Number 31    Number of Visits 52    Date for PT Re-Evaluation 11/18/20    Authorization Type [employer]    Authorization Time Period $10 co-pay,  09/08/2020 - 10/20/2020    Authorization - Visit Number 8    Authorization - Number of Visits 12    Progress Note Due on Visit 33    PT Start Time 1300    PT Stop Time 1345    PT Time Calculation (min) 45 min    Equipment Utilized During Treatment Gait belt    Activity Tolerance Patient tolerated treatment well    Behavior During Therapy Flat affect;WFL for tasks assessed/performed           Past Medical History:  Diagnosis Date  . Allergic rhinitis   . Amputation of left lower extremity above knee 08/23/2017  . Amputation stump infection 08/23/2017  . Atherosclerosis of native arteries of the extremities with gangrene 11/01/2017   2/4-2/15/2020 infected/necrotic third right toe 2/5 transmetatarsal amputation of toes 2-5 with removal of the big toe 2/12 by Dr Myra Gianotti All margins clean; Bactrim prescribed for Serratia osteomyelitis  . DM (diabetes mellitus), secondary, uncontrolled, with peripheral vascular complications    Current 11/12/2018 A1c 10.9%  . Dyslipidemia   . Essential hypertension, benign   . Gangrene of right foot 01/17/2019  . Hyperkalemia   . Hyponatremia 06/10/2017  . Insomnia   . Leukocytosis   . Mixed stress and urge urinary incontinence 06/11/2019  . OSA (obstructive sleep apnea)    no cpap  . Osteomyelitis of third toe of right foot 11/12/2018  . PAD (peripheral artery disease) 07/23/2017  . Pain in finger of right hand 02/20/2018  . PTSD (post-traumatic  stress disorder)   . Right below-knee amputee 01/23/2019  . Ulnar neuropathy of left upper extremity 10/23/2018    Past Surgical History:  Procedure Laterality Date  . ABDOMINAL AORTOGRAM W/LOWER EXTREMITY N/A 06/24/2017   Procedure: ABDOMINAL AORTOGRAM W/LOWER EXTREMITY;  Surgeon: Maeola Harman, MD;  Location: [facility] INVASIVE CV LAB;  Service: Cardiovascular;  Laterality: N/A;  . ABDOMINAL AORTOGRAM W/LOWER EXTREMITY Right 10/09/2017   Procedure: ABDOMINAL AORTOGRAM W/LOWER EXTREMITY;  Surgeon: Maeola Harman, MD;  Location: [facility] INVASIVE CV LAB;  Service: Cardiovascular;  Laterality: Right;  . AMPUTATION Left 07/23/2017   Procedure: AMPUTATION  BELOW KNEE;  Surgeon: Maeola Harman, MD;  Location: [facility] OR;  Service: Vascular;  Laterality: Left;  . AMPUTATION Left 08/12/2017   Procedure: REVISION BELOW KNEE;  Surgeon: Maeola Harman, MD;  Location: [facility] OR;  Service: Vascular;  Laterality: Left;  . AMPUTATION Left 08/27/2017   Procedure: left ABOVE KNEE AMPUTATION;  Surgeon: Maeola Harman, MD;  Location: [facility] OR;  Service: Vascular;  Laterality: Left;  . AMPUTATION Left 08/30/2017   Procedure: REVISION AMPUTATION ABOVE KNEE;  Surgeon: Nada Libman, MD;  Location: [facility] OR;  Service: Vascular;  Laterality: Left;  . AMPUTATION Right 11/04/2017   Procedure: AMPUTATION RIGHT FOURTH TOE;  Surgeon: Maeola Harman, MD;  Location: [facility] OR;  Service: Vascular;  Laterality: Right;  . AMPUTATION Right 11/12/2018   Procedure: AMPUTATION OF second,  third and fifth toes;  Surgeon: Nada Libman, MD;  Location: [facility] OR;  Service: Vascular;  Laterality: Right;  . AMPUTATION Right 01/19/2019   Procedure: AMPUTATION BELOW KNEE;  Surgeon: Sherren Kerns, MD;  Location: [facility] OR;  Service: Vascular;  Laterality: Right;  . APPLICATION OF WOUND VAC Left 08/27/2017   Procedure: APPLICATION OF WOUND VAC;  Surgeon: Maeola Harman, MD;  Location: [facility] OR;   Service: Vascular;  Laterality: Left;  . CARPAL TUNNEL RELEASE Right   . COLONOSCOPY    . LOWER EXTREMITY ANGIOGRAM Right 11/12/2018   Procedure: ANGIOGRAM OF Right LEG;  Surgeon: Nada Libman, MD;  Location: [facility] OR;  Service: Vascular;  Laterality: Right;  . LOWER EXTREMITY INTERVENTION Right 10/09/2017   Procedure: LOWER EXTREMITY INTERVENTION;  Surgeon: Maeola Harman, MD;  Location: [facility] INVASIVE CV LAB;  Service: Cardiovascular;  Laterality: Right;  . TOE AMPUTATION Right 11/12/2018   2ND 3RD & 5TH TOE   . TRANSMETATARSAL AMPUTATION Right 11/19/2018   Procedure: TRANSMETATARSAL AMPUTATION REVISION;  Surgeon: Nada Libman, MD;  Location: [facility] OR;  Service: Vascular;  Laterality: Right;    There were no vitals filed for this visit.   Subjective Assessment - 10/06/20 1300    Subjective She has been doing her modified leg press with blue theraband exercise without any issues.    Patient Stated Goals Walk with 2 prostheses in home mostly and maybe limited community.    Currently in Pain? Yes    Pain Score 5     Pain Location Hand    Pain Orientation Right;Left    Pain Descriptors / Indicators Pins and needles;Tingling    Pain Type Chronic pain;Neuropathic pain    Pain Onset More than a month ago    Aggravating Factors  neuropathy    Pain Relieving Factors meds                             [facility] Adult PT Treatment/Exercise - 10/06/20 1300      Transfers   Transfers Sit to Stand;Stand to [employer] Transfers    Sit to Stand 3: Mod assist;4: Min assist;With upper extremity assist;With armrests;From chair/3-in-1;Other (comment)   to RW with bil. prostheses   Sit to Stand Details Tactile cues for weight shifting;Visual cues for safe use of DME/AE;Verbal cues for sequencing;Verbal cues for technique;Verbal cues for safe use of DME/AE;Manual facilitation for weight bearing;Manual facilitation for weight shifting    Stand to Sit 4: Min assist;With upper  extremity assist;With armrests;To chair/3-in-1;Other (comment)   from RW with bil. prostheses   Stand to Sit Details (indicate cue type and reason) Tactile cues for weight shifting;Visual cues for safe use of DME/AE;Verbal cues for technique;Verbal cues for safe use of DME/AE;Manual facilitation for weight shifting    Stand Pivot Transfers 3: Mod assist   RW & bil. prostheses   Lateral/Scoot Transfers 5: Supervision;With armrests removed      Ambulation/Gait   Ambulation/Gait Yes    Ambulation/Gait Assistance 3: Mod assist    Ambulation/Gait Assistance Details demo, verbal & visual cues on moving / looking to target, stepping Lt &Rt prostheses to colored target on front of RW.    Ambulation Distance (Feet) 30 Feet   10' X 4 with 90* turn to sit, 30' X 1 straight path   Assistive device Prostheses;Rolling walker    Ambulation Surface Level;Indoor    Gait Comments PT verbal cues while PT  assisting pt on techniques being used.      Knee/Hip Exercises: Aerobic   Nustep BUEs & RLE (TTA) level 6 for 8 minutes      Prosthetics   Prosthetic Care Comments  need to check TTA prosthesis for slippage prior to standing or walking. She may need smaller size liner when it is time to replace them.  Make sure prosthetist measures limb prior to ordering new liners when it is time.  reviewed adjusting ply socks with TTA prosthesis    Current prosthetic wear tolerance (#hours/day)  3-5hrs 2-3x/day.  PT recommended wearing liner when napping in recliner and donning prosthesis when getting out of recliner.  Need to remove liner & wear shrinker for 4-6hours /day to enable skin to dry.     Current prosthetic weight-bearing tolerance (hours/day)  No pain or discomfort with standing in //bars.     Edema pitting edema TTA limb    Residual limb condition  right TTA has medium red rash over distal limb but no signs of infection.  bulbous edematous shape.  no open areas.    TFA no issues noted.     Education Provided  Correct ply sock adjustment;Other (comment)   see prosthetic care comments   Person(s) Educated Patient;Caregiver(s)    Education Method Explanation;Verbal cues    Education Method Verbalized understanding                    PT Short Term Goals - 10/05/20 1649      PT SHORT TERM GOAL #1   Title Patient reports ability to transfer from her recliner to w/c with TTA proshesis.    Time 1    Period Months    Status On-going    Target Date 10/28/20      PT SHORT TERM GOAL #2   Title Stand pivot transfer with RW between chairs with armrests to right or left with min A    Time 1    Period Months    Status Revised    Target Date 10/28/20      PT SHORT TERM GOAL #3   Title Sit to/from stand 5 reps w/c to RW with minimal assist    Baseline .    Time 1    Period Months    Status Revised    Target Date 10/28/20      PT SHORT TERM GOAL #4   Title patient ambulates 28' with RW & bilateral prostheses with minA.    Time 1    Period Months    Status Revised    Target Date 10/28/20             PT Long Term Goals - 08/24/20 1631      PT LONG TERM GOAL #1   Title Patient tolerates wear of Transtibial prosthesis and Transfemoral liner  >80% of awake hours and Transfemoral prosthesis >5hours total / day without skin or limb pain issues.  (All Target Date 11/24/2020)    Time 3    Period Months    Status On-going    Target Date 11/24/20      PT LONG TERM GOAL #2   Title Patient verbalizes and demonstrates understading of proper prosthetic care including donning both prostheses.    Time 3    Period Months    Status On-going    Target Date 11/24/20      PT LONG TERM GOAL #3   Title sit to / from stand from chairs with  armrests to RW and maintains upright 2 minutes with supervision.    Time 3    Period Months    Status Revised    Target Date 11/24/20      PT LONG TERM GOAL #4   Title Patient ambulates 5130' with RW & bilateral prostheses with minA from nephew.    Time 3     Period Months    Status Revised    Target Date 11/24/20                 Plan - 10/06/20 1259    Clinical Impression Statement PT instructed pt to look to target chair when moving RW. This improved her gait & posture with gait.    Personal Factors and Comorbidities Comorbidity 3+;Education;Fitness;Past/Current Experience;Social Background;Time since onset of injury/illness/exacerbation    Comorbidities Rt TTA, Lt TFA, DM, HTN, PVD, depression, right Carpal Tunnel Release surgery    Examination-Activity Limitations Locomotion Level;Squat;Stand;Transfers;Stairs    Examination-Participation Restrictions Community Activity    Stability/Clinical Decision Making Evolving/Moderate complexity    Rehab Potential Good    PT Frequency 2x / week    PT Duration 12 weeks    PT Treatment/Interventions ADLs/Self Care Home Management;DME Instruction;Gait training;Stair training;Functional mobility training;Therapeutic activities;Therapeutic exercise;Balance training;Neuromuscular re-education;Patient/family education;Prosthetic Training;Manual techniques;Scar mobilization;Passive range of motion;Vasopneumatic Device;Vestibular    PT Next Visit Plan standing & gait with RW, work towards updated STGs.    Consulted and Agree with Plan of Care Patient;Family member/caregiver    Family Member Consulted nephew / caregiver Marikay AlarGreg Richardson           Patient will benefit from skilled therapeutic intervention in order to improve the following deficits and impairments:  Abnormal gait,Cardiopulmonary status limiting activity,Decreased activity tolerance,Decreased balance,Decreased endurance,Decreased knowledge of use of DME,Decreased mobility,Decreased range of motion,Decreased skin integrity,Decreased scar mobility,Decreased strength,Dizziness,Increased edema,Impaired flexibility,Postural dysfunction,Prosthetic Dependency,Obesity,Pain  Visit Diagnosis: Muscle weakness (generalized)  Unsteadiness on  feet  Other abnormalities of gait and mobility  Abnormal posture  Pain in right hand  Stiffness of left hip, not elsewhere classified     Problem List Patient Active Problem List   Diagnosis Date Noted  . PTSD (post-traumatic stress disorder)   . Mixed stress and urge urinary incontinence 06/11/2019  . Right below-knee amputee 01/23/2019  . Ulnar neuropathy of left upper extremity 10/23/2018  . Pain in finger of right hand 02/20/2018  . Atherosclerosis of native arteries of the extremities with gangrene 11/01/2017  . OSA (obstructive sleep apnea)   . Benign essential HTN   . DM (diabetes mellitus), secondary, uncontrolled, with peripheral vascular complications   . Hyperkalemia   . Leukocytosis   . Acute blood loss anemia   . Amputation of left lower extremity above knee 08/23/2017  . PAD (peripheral artery disease) 07/23/2017  . Cellulitis 06/10/2017  . Hyponatremia 06/10/2017  . Fever 06/10/2017  . Rash 05/24/2017  . Allergic rhinitis   . Insomnia     Vladimir Fasterobin Stephie Xu, PT, DPT 10/06/2020, 1:50 PM  [facility] [address] [place], [place], [address] Phone: [phone]   Fax:  [phone]  Name: Danielle Buchanan MRN: [id number] Date of Birth: 1956/03/07

## 2020-10-11 ENCOUNTER — Other Ambulatory Visit: Payer: Self-pay

## 2020-10-11 ENCOUNTER — Encounter: Payer: Self-pay | Admitting: Physical Therapy

## 2020-10-11 ENCOUNTER — Ambulatory Visit: Payer: Medicare HMO | Admitting: Physical Therapy

## 2020-10-11 DIAGNOSIS — R293 Abnormal posture: Secondary | ICD-10-CM

## 2020-10-11 DIAGNOSIS — R2681 Unsteadiness on feet: Secondary | ICD-10-CM

## 2020-10-11 DIAGNOSIS — M6281 Muscle weakness (generalized): Secondary | ICD-10-CM

## 2020-10-11 DIAGNOSIS — M25652 Stiffness of left hip, not elsewhere classified: Secondary | ICD-10-CM | POA: Diagnosis not present

## 2020-10-11 DIAGNOSIS — R2689 Other abnormalities of gait and mobility: Secondary | ICD-10-CM

## 2020-10-11 DIAGNOSIS — M79641 Pain in right hand: Secondary | ICD-10-CM | POA: Diagnosis not present

## 2020-10-11 NOTE — Therapy (Signed)
[facility] Physical Therapy [address] [place], [place], [address] Phone: [phone]   Fax:  [phone]  Physical Therapy Treatment  Patient Details  Name: Danielle Buchanan MRN: [id number] Date of Birth: [birth year] Referring Provider (PT): [name], MD   Encounter Date: 10/11/2020   PT End of Session - 10/11/20 1348    Visit Number 32    Number of Visits 52    Date for PT Re-Evaluation 11/18/20    Authorization Type [employer]    Authorization Time Period $10 co-pay,  09/08/2020 - 10/20/2020    Authorization - Visit Number 9    Authorization - Number of Visits 12    Progress Note Due on Visit 33    PT Start Time 1345    PT Stop Time 1426    PT Time Calculation (min) 41 min    Equipment Utilized During Treatment Gait belt    Activity Tolerance Patient tolerated treatment well    Behavior During Therapy Flat affect;WFL for tasks assessed/performed           Past Medical History:  Diagnosis Date  . Allergic rhinitis   . Amputation of left lower extremity above knee 08/23/2017  . Amputation stump infection 08/23/2017  . Atherosclerosis of native arteries of the extremities with gangrene 11/01/2017   2/4-2/15/2020 infected/necrotic third right toe 2/5 transmetatarsal amputation of toes 2-5 with removal of the big toe 2/12 by Dr Myra Gianotti All margins clean; Bactrim prescribed for Serratia osteomyelitis  . DM (diabetes mellitus), secondary, uncontrolled, with peripheral vascular complications    Current 11/12/2018 A1c 10.9%  . Dyslipidemia   . Essential hypertension, benign   . Gangrene of right foot 01/17/2019  . Hyperkalemia   . Hyponatremia 06/10/2017  . Insomnia   . Leukocytosis   . Mixed stress and urge urinary incontinence 06/11/2019  . OSA (obstructive sleep apnea)    no cpap  . Osteomyelitis of third toe of right foot 11/12/2018  . PAD (peripheral artery disease) 07/23/2017  . Pain in finger of right hand 02/20/2018  . PTSD (post-traumatic stress  disorder)   . Right below-knee amputee 01/23/2019  . Ulnar neuropathy of left upper extremity 10/23/2018    Past Surgical History:  Procedure Laterality Date  . ABDOMINAL AORTOGRAM W/LOWER EXTREMITY N/A 06/24/2017   Procedure: ABDOMINAL AORTOGRAM W/LOWER EXTREMITY;  Surgeon: Maeola Harman, MD;  Location: [facility] INVASIVE CV LAB;  Service: Cardiovascular;  Laterality: N/A;  . ABDOMINAL AORTOGRAM W/LOWER EXTREMITY Right 10/09/2017   Procedure: ABDOMINAL AORTOGRAM W/LOWER EXTREMITY;  Surgeon: Maeola Harman, MD;  Location: [facility] INVASIVE CV LAB;  Service: Cardiovascular;  Laterality: Right;  . AMPUTATION Left 07/23/2017   Procedure: AMPUTATION  BELOW KNEE;  Surgeon: Maeola Harman, MD;  Location: [facility] OR;  Service: Vascular;  Laterality: Left;  . AMPUTATION Left 08/12/2017   Procedure: REVISION BELOW KNEE;  Surgeon: Maeola Harman, MD;  Location: [facility] OR;  Service: Vascular;  Laterality: Left;  . AMPUTATION Left 08/27/2017   Procedure: left ABOVE KNEE AMPUTATION;  Surgeon: Maeola Harman, MD;  Location: [facility] OR;  Service: Vascular;  Laterality: Left;  . AMPUTATION Left 08/30/2017   Procedure: REVISION AMPUTATION ABOVE KNEE;  Surgeon: Nada Libman, MD;  Location: [facility] OR;  Service: Vascular;  Laterality: Left;  . AMPUTATION Right 11/04/2017   Procedure: AMPUTATION RIGHT FOURTH TOE;  Surgeon: Maeola Harman, MD;  Location: [facility] OR;  Service: Vascular;  Laterality: Right;  . AMPUTATION Right 11/12/2018   Procedure: AMPUTATION OF second,  third and fifth toes;  Surgeon: Serafina Mitchell, MD;  Location: [place];  Service: Vascular;  Laterality: Right;  . AMPUTATION Right 01/19/2019   Procedure: AMPUTATION BELOW KNEE;  Surgeon: Elam Dutch, MD;  Location: [place];  Service: Vascular;  Laterality: Right;  . APPLICATION OF WOUND VAC Left 08/27/2017   Procedure: APPLICATION OF WOUND VAC;  Surgeon: Waynetta Sandy, MD;  Location: [place];  Service:  Vascular;  Laterality: Left;  . CARPAL TUNNEL RELEASE Right   . COLONOSCOPY    . LOWER EXTREMITY ANGIOGRAM Right 11/12/2018   Procedure: ANGIOGRAM OF Right LEG;  Surgeon: Serafina Mitchell, MD;  Location: [place];  Service: Vascular;  Laterality: Right;  . LOWER EXTREMITY INTERVENTION Right 10/09/2017   Procedure: LOWER EXTREMITY INTERVENTION;  Surgeon: Waynetta Sandy, MD;  Location: [place] CV LAB;  Service: Cardiovascular;  Laterality: Right;  . TOE AMPUTATION Right 11/12/2018   2ND 3RD & 5TH TOE   . TRANSMETATARSAL AMPUTATION Right 11/19/2018   Procedure: TRANSMETATARSAL AMPUTATION REVISION;  Surgeon: Serafina Mitchell, MD;  Location: [place];  Service: Vascular;  Laterality: Right;    There were no vitals filed for this visit.   Subjective Assessment - 10/11/20 1345    Subjective She has been standing with nephew. Yesterday she had difficulty.    Patient Stated Goals Walk with 2 prostheses in home mostly and maybe limited community.    Currently in Pain? Yes    Pain Score 5     Pain Location Hand    Pain Orientation Right;Left    Pain Descriptors / Indicators Pins and needles;Tingling    Pain Type Chronic pain;Neuropathic pain    Pain Onset More than a month ago    Pain Frequency Constant                             [facility] Adult PT Treatment/Exercise - 10/11/20 1345      Transfers   Transfers Sit to Stand;Stand to [employer] Transfers    Sit to Stand 3: Mod assist;4: Min assist;With upper extremity assist;With armrests;From chair/3-in-1;Other (comment)   to RW with bil. prostheses   Sit to Stand Details Tactile cues for weight shifting;Visual cues for safe use of DME/AE;Verbal cues for sequencing;Verbal cues for technique;Verbal cues for safe use of DME/AE;Manual facilitation for weight bearing;Manual facilitation for weight shifting    Stand to Sit 4: Min assist;With upper extremity assist;With armrests;To chair/3-in-1;Other (comment)   from RW with  bil. prostheses   Stand to Sit Details (indicate cue type and reason) Tactile cues for weight shifting;Visual cues for safe use of DME/AE;Verbal cues for technique;Verbal cues for safe use of DME/AE;Manual facilitation for weight shifting    Stand Pivot Transfers --    Lateral/Scoot Transfers --    Comments PT recommended trying to stand with TTA prosthesis 1-2 itmes prior to standing activities with bil. prostheses      Ambulation/Gait   Ambulation/Gait Yes    Ambulation/Gait Assistance 3: Mod assist    Ambulation/Gait Assistance Details PT demo & verbal cues on technique to pivot 90* left & right to position to sit and looking forward when moving RW and visual cues for step width for Rt & Lt prosthesis.    Ambulation Distance (Feet) 30 Feet   15' X 4 with 90* turn to sit,   Assistive device Prostheses;Rolling walker    Gait Comments PT verbal cues while PT assisting  pt on techniques being used.      Knee/Hip Exercises: Aerobic   Nustep BUEs & RLE (TTA) level 6 for 8 minutes      Prosthetics   Prosthetic Care Comments  --    Current prosthetic wear tolerance (#hours/day)  3-5hrs 2-3x/day.  PT recommended wearing liner when napping in recliner and donning prosthesis when getting out of recliner.  Need to remove liner & wear shrinker for 4-6hours /day to enable skin to dry.     Current prosthetic weight-bearing tolerance (hours/day)  No pain or discomfort with standing in //bars.     Edema pitting edema TTA limb    Residual limb condition  right TTA has medium red rash over distal limb but no signs of infection.  bulbous edematous shape.  no open areas.    TFA no issues noted.     Education Provided --                    PT Short Term Goals - 10/05/20 1649      PT SHORT TERM GOAL #1   Title Patient reports ability to transfer from her recliner to w/c with TTA proshesis.    Time 1    Period Months    Status On-going    Target Date 10/28/20      PT SHORT TERM GOAL #2    Title Stand pivot transfer with RW between chairs with armrests to right or left with min A    Time 1    Period Months    Status Revised    Target Date 10/28/20      PT SHORT TERM GOAL #3   Title Sit to/from stand 5 reps w/c to RW with minimal assist    Baseline .    Time 1    Period Months    Status Revised    Target Date 10/28/20      PT SHORT TERM GOAL #4   Title patient ambulates 33' with RW & bilateral prostheses with minA.    Time 1    Period Months    Status Revised    Target Date 10/28/20             PT Long Term Goals - 08/24/20 1631      PT LONG TERM GOAL #1   Title Patient tolerates wear of Transtibial prosthesis and Transfemoral liner  >80% of awake hours and Transfemoral prosthesis >5hours total / day without skin or limb pain issues.  (All Target Date 11/24/2020)    Time 3    Period Months    Status On-going    Target Date 11/24/20      PT LONG TERM GOAL #2   Title Patient verbalizes and demonstrates understading of proper prosthetic care including donning both prostheses.    Time 3    Period Months    Status On-going    Target Date 11/24/20      PT LONG TERM GOAL #3   Title sit to / from stand from chairs with armrests to RW and maintains upright 2 minutes with supervision.    Time 3    Period Months    Status Revised    Target Date 11/24/20      PT LONG TERM GOAL #4   Title Patient ambulates 27' with RW & bilateral prostheses with minA from nephew.    Time 3    Period Months    Status Revised    Target Date 11/24/20  Plan - 10/11/20 1345    Clinical Impression Statement PT session focused on sitting technique including when fatigues with gait & needs to sit.  She was able to follow diretion during session but needs multiple instructions for carryover.  PT also worked on turning 90* to position to sit.    Personal Factors and Comorbidities Comorbidity 3+;Education;Fitness;Past/Current Experience;Social Background;Time  since onset of injury/illness/exacerbation    Comorbidities Rt TTA, Lt TFA, DM, HTN, PVD, depression, right Carpal Tunnel Release surgery    Examination-Activity Limitations Locomotion Level;Squat;Stand;Transfers;Stairs    Examination-Participation Restrictions Community Activity    Stability/Clinical Decision Making Evolving/Moderate complexity    Rehab Potential Good    PT Frequency 2x / week    PT Duration 12 weeks    PT Treatment/Interventions ADLs/Self Care Home Management;DME Instruction;Gait training;Stair training;Functional mobility training;Therapeutic activities;Therapeutic exercise;Balance training;Neuromuscular re-education;Patient/family education;Prosthetic Training;Manual techniques;Scar mobilization;Passive range of motion;Vasopneumatic Device;Vestibular    PT Next Visit Plan standing & gait with RW, work towards updated STGs.    Consulted and Agree with Plan of Care Patient;Family member/caregiver    Family Member Consulted nephew / caregiver Marikay Alar           Patient will benefit from skilled therapeutic intervention in order to improve the following deficits and impairments:  Abnormal gait,Cardiopulmonary status limiting activity,Decreased activity tolerance,Decreased balance,Decreased endurance,Decreased knowledge of use of DME,Decreased mobility,Decreased range of motion,Decreased skin integrity,Decreased scar mobility,Decreased strength,Dizziness,Increased edema,Impaired flexibility,Postural dysfunction,Prosthetic Dependency,Obesity,Pain  Visit Diagnosis: Muscle weakness (generalized)  Unsteadiness on feet  Other abnormalities of gait and mobility  Abnormal posture  Pain in right hand  Stiffness of left hip, not elsewhere classified     Problem List Patient Active Problem List   Diagnosis Date Noted  . PTSD (post-traumatic stress disorder)   . Mixed stress and urge urinary incontinence 06/11/2019  . Right below-knee amputee 01/23/2019  . Ulnar  neuropathy of left upper extremity 10/23/2018  . Pain in finger of right hand 02/20/2018  . Atherosclerosis of native arteries of the extremities with gangrene 11/01/2017  . OSA (obstructive sleep apnea)   . Benign essential HTN   . DM (diabetes mellitus), secondary, uncontrolled, with peripheral vascular complications   . Hyperkalemia   . Leukocytosis   . Acute blood loss anemia   . Amputation of left lower extremity above knee 08/23/2017  . PAD (peripheral artery disease) 07/23/2017  . Cellulitis 06/10/2017  . Hyponatremia 06/10/2017  . Fever 06/10/2017  . Rash 05/24/2017  . Allergic rhinitis   . Insomnia     Vladimir Faster, PT, DPT 10/11/2020, 8:55 PM  [facility] Physical Therapy [address] [place], [place], [address] Phone: [phone]   Fax:  [phone]  Name: Danielle Buchanan MRN: [id number] Date of Birth: Aug 29, 1956

## 2020-10-13 ENCOUNTER — Ambulatory Visit: Payer: Medicare HMO | Admitting: Physical Therapy

## 2020-10-13 ENCOUNTER — Other Ambulatory Visit: Payer: Self-pay

## 2020-10-13 ENCOUNTER — Encounter: Payer: Self-pay | Admitting: Physical Therapy

## 2020-10-13 DIAGNOSIS — R2689 Other abnormalities of gait and mobility: Secondary | ICD-10-CM

## 2020-10-13 DIAGNOSIS — M6281 Muscle weakness (generalized): Secondary | ICD-10-CM

## 2020-10-13 DIAGNOSIS — R2681 Unsteadiness on feet: Secondary | ICD-10-CM | POA: Diagnosis not present

## 2020-10-13 DIAGNOSIS — M79641 Pain in right hand: Secondary | ICD-10-CM | POA: Diagnosis not present

## 2020-10-13 DIAGNOSIS — M25652 Stiffness of left hip, not elsewhere classified: Secondary | ICD-10-CM

## 2020-10-13 DIAGNOSIS — R293 Abnormal posture: Secondary | ICD-10-CM

## 2020-10-13 NOTE — Therapy (Signed)
[facility] Physical Therapy [address] [place], [place], [address] Phone: [phone]   Fax:  [phone]  Physical Therapy Treatment  Patient Details  Name: Danielle Buchanan MRN: [id number] Date of Birth: [birth year] Referring Provider (PT): [name], MD   Encounter Date: 10/13/2020   PT End of Session - 10/13/20 1258    Visit Number 33    Number of Visits 52    Date for PT Re-Evaluation 11/18/20    Authorization Type [employer]    Authorization Time Period $10 co-pay,  09/08/2020 - 10/20/2020    Authorization - Visit Number 10    Authorization - Number of Visits 12    Progress Note Due on Visit 33    PT Start Time 1300    PT Stop Time 1345    PT Time Calculation (min) 45 min    Equipment Utilized During Treatment Gait belt    Activity Tolerance Patient tolerated treatment well    Behavior During Therapy Flat affect;WFL for tasks assessed/performed           Past Medical History:  Diagnosis Date  . Allergic rhinitis   . Amputation of left lower extremity above knee 08/23/2017  . Amputation stump infection 08/23/2017  . Atherosclerosis of native arteries of the extremities with gangrene 11/01/2017   2/4-2/15/2020 infected/necrotic third right toe 2/5 transmetatarsal amputation of toes 2-5 with removal of the big toe 2/12 by Dr Myra Gianotti All margins clean; Bactrim prescribed for Serratia osteomyelitis  . DM (diabetes mellitus), secondary, uncontrolled, with peripheral vascular complications    Current 11/12/2018 A1c 10.9%  . Dyslipidemia   . Essential hypertension, benign   . Gangrene of right foot 01/17/2019  . Hyperkalemia   . Hyponatremia 06/10/2017  . Insomnia   . Leukocytosis   . Mixed stress and urge urinary incontinence 06/11/2019  . OSA (obstructive sleep apnea)    no cpap  . Osteomyelitis of third toe of right foot 11/12/2018  . PAD (peripheral artery disease) 07/23/2017  . Pain in finger of right hand 02/20/2018  . PTSD (post-traumatic  stress disorder)   . Right below-knee amputee 01/23/2019  . Ulnar neuropathy of left upper extremity 10/23/2018    Past Surgical History:  Procedure Laterality Date  . ABDOMINAL AORTOGRAM W/LOWER EXTREMITY N/A 06/24/2017   Procedure: ABDOMINAL AORTOGRAM W/LOWER EXTREMITY;  Surgeon: Maeola Harman, MD;  Location: [facility] INVASIVE CV LAB;  Service: Cardiovascular;  Laterality: N/A;  . ABDOMINAL AORTOGRAM W/LOWER EXTREMITY Right 10/09/2017   Procedure: ABDOMINAL AORTOGRAM W/LOWER EXTREMITY;  Surgeon: Maeola Harman, MD;  Location: [facility] INVASIVE CV LAB;  Service: Cardiovascular;  Laterality: Right;  . AMPUTATION Left 07/23/2017   Procedure: AMPUTATION  BELOW KNEE;  Surgeon: Maeola Harman, MD;  Location: [facility] OR;  Service: Vascular;  Laterality: Left;  . AMPUTATION Left 08/12/2017   Procedure: REVISION BELOW KNEE;  Surgeon: Maeola Harman, MD;  Location: [facility] OR;  Service: Vascular;  Laterality: Left;  . AMPUTATION Left 08/27/2017   Procedure: left ABOVE KNEE AMPUTATION;  Surgeon: Maeola Harman, MD;  Location: [facility] OR;  Service: Vascular;  Laterality: Left;  . AMPUTATION Left 08/30/2017   Procedure: REVISION AMPUTATION ABOVE KNEE;  Surgeon: Nada Libman, MD;  Location: [facility] OR;  Service: Vascular;  Laterality: Left;  . AMPUTATION Right 11/04/2017   Procedure: AMPUTATION RIGHT FOURTH TOE;  Surgeon: Maeola Harman, MD;  Location: [facility] OR;  Service: Vascular;  Laterality: Right;  . AMPUTATION Right 11/12/2018   Procedure: AMPUTATION OF second,  third and fifth toes;  Surgeon: Nada Libman, MD;  Location: [facility] OR;  Service: Vascular;  Laterality: Right;  . AMPUTATION Right 01/19/2019   Procedure: AMPUTATION BELOW KNEE;  Surgeon: Sherren Kerns, MD;  Location: [facility] OR;  Service: Vascular;  Laterality: Right;  . APPLICATION OF WOUND VAC Left 08/27/2017   Procedure: APPLICATION OF WOUND VAC;  Surgeon: Maeola Harman, MD;  Location: [facility] OR;   Service: Vascular;  Laterality: Left;  . CARPAL TUNNEL RELEASE Right   . COLONOSCOPY    . LOWER EXTREMITY ANGIOGRAM Right 11/12/2018   Procedure: ANGIOGRAM OF Right LEG;  Surgeon: Nada Libman, MD;  Location: [facility] OR;  Service: Vascular;  Laterality: Right;  . LOWER EXTREMITY INTERVENTION Right 10/09/2017   Procedure: LOWER EXTREMITY INTERVENTION;  Surgeon: Maeola Harman, MD;  Location: [facility] INVASIVE CV LAB;  Service: Cardiovascular;  Laterality: Right;  . TOE AMPUTATION Right 11/12/2018   2ND 3RD & 5TH TOE   . TRANSMETATARSAL AMPUTATION Right 11/19/2018   Procedure: TRANSMETATARSAL AMPUTATION REVISION;  Surgeon: Nada Libman, MD;  Location: [facility] OR;  Service: Vascular;  Laterality: Right;    There were no vitals filed for this visit.   Subjective Assessment - 10/13/20 1258    Subjective Her nephew reports that he is done with assisting her at home.  He is willing to bring her to & from PT but does not feel qualified to continue to help her.    Patient Stated Goals Walk with 2 prostheses in home mostly and maybe limited community.    Currently in Pain? Yes    Pain Score 5     Pain Location Hand    Pain Orientation Right;Left    Pain Descriptors / Indicators Pins and needles;Tingling    Pain Type Chronic pain;Neuropathic pain    Pain Onset More than a month ago    Pain Frequency Constant          Patient's nephew reports that he feels he can no longer lift & exercise her outside PT. He thinks it is beyond his scope of abilities.  PT, pt and nephew discussed options of paying someone to come into apt ~3 days / wk to exercise & help her stand vs assisted living situations. Patient reported after nephew went to waiting room that she is not able to get in her bed to exercise as niece & nephew have items stacked on her bed.  Also when nephew becomes frustrated he yells at her a lot.   Patient would like to continue PT while options for assistance are researched.                               PT Short Term Goals - 10/05/20 1649      PT SHORT TERM GOAL #1   Title Patient reports ability to transfer from her recliner to w/c with TTA proshesis.    Time 1    Period Months    Status On-going    Target Date 10/28/20      PT SHORT TERM GOAL #2   Title Stand pivot transfer with RW between chairs with armrests to right or left with min A    Time 1    Period Months    Status Revised    Target Date 10/28/20      PT SHORT TERM GOAL #3   Title Sit to/from stand 5 reps w/c to RW with  minimal assist    Baseline .    Time 1    Period Months    Status Revised    Target Date 10/28/20      PT SHORT TERM GOAL #4   Title patient ambulates 26' with RW & bilateral prostheses with minA.    Time 1    Period Months    Status Revised    Target Date 10/28/20             PT Long Term Goals - 08/24/20 1631      PT LONG TERM GOAL #1   Title Patient tolerates wear of Transtibial prosthesis and Transfemoral liner  >80% of awake hours and Transfemoral prosthesis >5hours total / day without skin or limb pain issues.  (All Target Date 11/24/2020)    Time 3    Period Months    Status On-going    Target Date 11/24/20      PT LONG TERM GOAL #2   Title Patient verbalizes and demonstrates understading of proper prosthetic care including donning both prostheses.    Time 3    Period Months    Status On-going    Target Date 11/24/20      PT LONG TERM GOAL #3   Title sit to / from stand from chairs with armrests to RW and maintains upright 2 minutes with supervision.    Time 3    Period Months    Status Revised    Target Date 11/24/20      PT LONG TERM GOAL #4   Title Patient ambulates 19' with RW & bilateral prostheses with minA from nephew.    Time 3    Period Months    Status Revised    Target Date 11/24/20                 Plan - 10/13/20 1258    Clinical Impression Statement Long discussion with patient (& nephew  for first 30 minuntes) on plan & options.   See PT note for details.    Personal Factors and Comorbidities Comorbidity 3+;Education;Fitness;Past/Current Experience;Social Background;Time since onset of injury/illness/exacerbation    Comorbidities Rt TTA, Lt TFA, DM, HTN, PVD, depression, right Carpal Tunnel Release surgery    Examination-Activity Limitations Locomotion Level;Squat;Stand;Transfers;Stairs    Examination-Participation Restrictions Community Activity    Stability/Clinical Decision Making Evolving/Moderate complexity    Rehab Potential Good    PT Frequency 2x / week    PT Duration 12 weeks    PT Treatment/Interventions ADLs/Self Care Home Management;DME Instruction;Gait training;Stair training;Functional mobility training;Therapeutic activities;Therapeutic exercise;Balance training;Neuromuscular re-education;Patient/family education;Prosthetic Training;Manual techniques;Scar mobilization;Passive range of motion;Vasopneumatic Device;Vestibular    PT Next Visit Plan standing & gait with RW, work towards updated STGs.    Consulted and Agree with Plan of Care Patient;Family member/caregiver    Family Member Consulted nephew / caregiver Marikay Alar           Patient will benefit from skilled therapeutic intervention in order to improve the following deficits and impairments:  Abnormal gait,Cardiopulmonary status limiting activity,Decreased activity tolerance,Decreased balance,Decreased endurance,Decreased knowledge of use of DME,Decreased mobility,Decreased range of motion,Decreased skin integrity,Decreased scar mobility,Decreased strength,Dizziness,Increased edema,Impaired flexibility,Postural dysfunction,Prosthetic Dependency,Obesity,Pain  Visit Diagnosis: Muscle weakness (generalized)  Unsteadiness on feet  Other abnormalities of gait and mobility  Abnormal posture  Pain in right hand  Stiffness of left hip, not elsewhere classified     Problem List Patient Active  Problem List   Diagnosis Date Noted  . PTSD (post-traumatic stress disorder)   .  Mixed stress and urge urinary incontinence 06/11/2019  . Right below-knee amputee 01/23/2019  . Ulnar neuropathy of left upper extremity 10/23/2018  . Pain in finger of right hand 02/20/2018  . Atherosclerosis of native arteries of the extremities with gangrene 11/01/2017  . OSA (obstructive sleep apnea)   . Benign essential HTN   . DM (diabetes mellitus), secondary, uncontrolled, with peripheral vascular complications   . Hyperkalemia   . Leukocytosis   . Acute blood loss anemia   . Amputation of left lower extremity above knee 08/23/2017  . PAD (peripheral artery disease) 07/23/2017  . Cellulitis 06/10/2017  . Hyponatremia 06/10/2017  . Fever 06/10/2017  . Rash 05/24/2017  . Allergic rhinitis   . Insomnia     Jamey Reas, PT, DPT 10/13/2020, 2:01 PM  [facility] Physical Therapy [address] [place], [place], [address] Phone: [phone]   Fax:  [phone]  Name: Danielle Buchanan MRN: [id number] Date of Birth: 18-Mar-1956

## 2020-10-17 ENCOUNTER — Other Ambulatory Visit: Payer: Self-pay

## 2020-10-17 ENCOUNTER — Encounter: Payer: Self-pay | Admitting: Physical Therapy

## 2020-10-17 ENCOUNTER — Ambulatory Visit: Payer: Medicare HMO | Admitting: Physical Therapy

## 2020-10-17 DIAGNOSIS — R2681 Unsteadiness on feet: Secondary | ICD-10-CM

## 2020-10-17 DIAGNOSIS — M25652 Stiffness of left hip, not elsewhere classified: Secondary | ICD-10-CM

## 2020-10-17 DIAGNOSIS — R2689 Other abnormalities of gait and mobility: Secondary | ICD-10-CM | POA: Diagnosis not present

## 2020-10-17 DIAGNOSIS — M79641 Pain in right hand: Secondary | ICD-10-CM | POA: Diagnosis not present

## 2020-10-17 DIAGNOSIS — M6281 Muscle weakness (generalized): Secondary | ICD-10-CM

## 2020-10-17 DIAGNOSIS — R293 Abnormal posture: Secondary | ICD-10-CM | POA: Diagnosis not present

## 2020-10-17 NOTE — Therapy (Signed)
[facility] [address] [place], [place], [id number] Phone: [phone]   Fax:  [phone]  Physical Therapy Treatment  Patient Details  Name: Danielle Buchanan MRN: [id number] Date of Birth: June 02, 1956 Referring Provider (PT): Shirlean Mylararol Webb, MD   Encounter Date: 10/17/2020   PT End of Session - 10/17/20 1258    Visit Number 34    Number of Visits 52    Date for PT Re-Evaluation 11/18/20    Authorization Type [employer]    Authorization Time Period $10 co-pay,  09/08/2020 - 10/20/2020    Authorization - Visit Number 11    Authorization - Number of Visits 12    Progress Note Due on Visit 33    PT Start Time 1300    PT Stop Time 1345    PT Time Calculation (min) 45 min    Equipment Utilized During Treatment Gait belt    Activity Tolerance Patient tolerated treatment well    Behavior During Therapy Flat affect;WFL for tasks assessed/performed           Past Medical History:  Diagnosis Date  . Allergic rhinitis   . Amputation of left lower extremity above knee 08/23/2017  . Amputation stump infection 08/23/2017  . Atherosclerosis of native arteries of the extremities with gangrene 11/01/2017   2/4-2/15/2020 infected/necrotic third right toe 2/5 transmetatarsal amputation of toes 2-5 with removal of the big toe 2/12 by Dr Myra GianottiBrabham All margins clean; Bactrim prescribed for Serratia osteomyelitis  . DM (diabetes mellitus), secondary, uncontrolled, with peripheral vascular complications    Current 11/12/2018 A1c 10.9%  . Dyslipidemia   . Essential hypertension, benign   . Gangrene of right foot 01/17/2019  . Hyperkalemia   . Hyponatremia 06/10/2017  . Insomnia   . Leukocytosis   . Mixed stress and urge urinary incontinence 06/11/2019  . OSA (obstructive sleep apnea)    no cpap  . Osteomyelitis of third toe of right foot 11/12/2018  . PAD (peripheral artery disease) 07/23/2017  . Pain in finger of right hand 02/20/2018  . PTSD (post-traumatic  stress disorder)   . Right below-knee amputee 01/23/2019  . Ulnar neuropathy of left upper extremity 10/23/2018    Past Surgical History:  Procedure Laterality Date  . ABDOMINAL AORTOGRAM W/LOWER EXTREMITY N/A 06/24/2017   Procedure: ABDOMINAL AORTOGRAM W/LOWER EXTREMITY;  Surgeon: Maeola Harmanain, Brandon Christopher, MD;  [facility];  Service: Cardiovascular;  Laterality: N/A;  . ABDOMINAL AORTOGRAM W/LOWER EXTREMITY Right 10/09/2017   Procedure: ABDOMINAL AORTOGRAM W/LOWER EXTREMITY;  Surgeon: Maeola Harmanain, Brandon Christopher, MD;  [facility];  Service: Cardiovascular;  Laterality: Right;  . AMPUTATION Left 07/23/2017   Procedure: AMPUTATION  BELOW KNEE;  Surgeon: Maeola Harmanain, Brandon Christopher, MD;  [facility];  Service: Vascular;  Laterality: Left;  . AMPUTATION Left 08/12/2017   Procedure: REVISION BELOW KNEE;  Surgeon: Maeola Harmanain, Brandon Christopher, MD;  [facility];  Service: Vascular;  Laterality: Left;  . AMPUTATION Left 08/27/2017   Procedure: left ABOVE KNEE AMPUTATION;  Surgeon: Maeola Harmanain, Brandon Christopher, MD;  [facility];  Service: Vascular;  Laterality: Left;  . AMPUTATION Left 08/30/2017   Procedure: REVISION AMPUTATION ABOVE KNEE;  Surgeon: Nada LibmanBrabham, Vance W, MD;  [facility];  Service: Vascular;  Laterality: Left;  . AMPUTATION Right 11/04/2017   Procedure: AMPUTATION RIGHT FOURTH TOE;  Surgeon: Maeola Harmanain, Brandon Christopher, MD;  [facility];  Service: Vascular;  Laterality: Right;  . AMPUTATION Right 11/12/2018   Procedure: AMPUTATION OF second,  third and fifth toes;  Surgeon: Nada Libman, MD;  Location: [facility] OR;  Service: Vascular;  Laterality: Right;  . AMPUTATION Right 01/19/2019   Procedure: AMPUTATION BELOW KNEE;  Surgeon: Sherren Kerns, MD;  Location[facility] OR;  Service: Vascular;  Laterality: Right;  . APPLICATION OF WOUND VAC Left 08/27/2017   Procedure: APPLICATION OF WOUND VAC;  Surgeon: Maeola Harman, MD;  Location: [facility] OR;   Service: Vascular;  Laterality: Left;  . CARPAL TUNNEL RELEASE Right   . COLONOSCOPY    . LOWER EXTREMITY ANGIOGRAM Right 11/12/2018   Procedure: ANGIOGRAM OF Right LEG;  Surgeon: Nada Libman, MD;  [facility] OR;  Service: Vascular;  Laterality: Right;  . LOWER EXTREMITY INTERVENTION Right 10/09/2017   Procedure: LOWER EXTREMITY INTERVENTION;  Surgeon: Maeola Harman, MD;  Location: [facility] INVASIVE CV LAB;  Service: Cardiovascular;  Laterality: Right;  . TOE AMPUTATION Right 11/12/2018   2ND 3RD & 5TH TOE   . TRANSMETATARSAL AMPUTATION Right 11/19/2018   Procedure: TRANSMETATARSAL AMPUTATION REVISION;  Surgeon: Nada Libman, MD;  [facility] OR;  Service: Vascular;  Laterality: Right;    There were no vitals filed for this visit.   Subjective Assessment - 10/17/20 1258    Subjective She reports worked on standing on porch with fence & transferring with BKA prosthesis.  She did go outside wtih her power w/c without issues.    Patient Stated Goals Walk with 2 prostheses in home mostly and maybe limited community.    Currently in Pain? Yes    Pain Score 9     Pain Location Hand    Pain Orientation Right;Left    Pain Descriptors / Indicators Pins and needles;Tingling    Pain Type Chronic pain;Neuropathic pain    Pain Onset More than a month ago                             [facility] Adult PT Treatment/Exercise - 10/17/20 1300      Transfers   Transfers Sit to Stand;Stand to [employer]    Sit to Stand 3: Mod assist;4: Min assist;With upper extremity assist;With armrests;From chair/3-in-1;Other (comment)   to RW with bil. prostheses   Sit to Stand Details Tactile cues for weight shifting;Visual cues for safe use of DME/AE;Verbal cues for sequencing;Verbal cues for technique;Verbal cues for safe use of DME/AE;Manual facilitation for weight bearing;Manual facilitation for weight shifting    Stand to Sit 4: Min assist;With upper extremity assist;With  armrests;To chair/3-in-1;Other (comment)   from RW with bil. prostheses   Stand to Sit Details (indicate cue type and reason) Tactile cues for weight shifting;Visual cues for safe use of DME/AE;Verbal cues for technique;Verbal cues for safe use of DME/AE;Manual facilitation for weight shifting      Ambulation/Gait   Ambulation/Gait Yes    Ambulation/Gait Assistance 3: Mod assist    Ambulation/Gait Assistance Details PT demo & verbal cues on technique to pivot 90* left & right to position to sit and looking forward when moving RW and visual cues for step width for Rt & Lt prosthesis.    Ambulation Distance (Feet) 17 Feet   15' X 3 with 90* turn to sit,17' straight path max tolerable distance   Assistive device Prostheses;Rolling walker      Knee/Hip Exercises: Aerobic   Nustep BUEs & RLE (TTA) level 6 for 8 minutes      Prosthetics   Current prosthetic wear  tolerance (#hours/day)  3-5hrs 2-3x/day.  PT recommended wearing liner when napping in recliner and donning prosthesis when getting out of recliner.  Need to remove liner & wear shrinker for 4-6hours /day to enable skin to dry.     Current prosthetic weight-bearing tolerance (hours/day)  No pain or discomfort with standing in //bars.     Edema pitting edema TTA limb    Residual limb condition  right TTA has medium red rash over distal limb but no signs of infection.  bulbous edematous shape.  no open areas.    TFA no issues noted.                     PT Short Term Goals - 10/05/20 1649      PT SHORT TERM GOAL #1   Title Patient reports ability to transfer from her recliner to w/c with TTA proshesis.    Time 1    Period Months    Status On-going    Target Date 10/28/20      PT SHORT TERM GOAL #2   Title Stand pivot transfer with RW between chairs with armrests to right or left with min A    Time 1    Period Months    Status Revised    Target Date 10/28/20      PT SHORT TERM GOAL #3   Title Sit to/from stand 5 reps w/c  to RW with minimal assist    Baseline .    Time 1    Period Months    Status Revised    Target Date 10/28/20      PT SHORT TERM GOAL #4   Title patient ambulates 39' with RW & bilateral prostheses with minA.    Time 1    Period Months    Status Revised    Target Date 10/28/20             PT Long Term Goals - 08/24/20 1631      PT LONG TERM GOAL #1   Title Patient tolerates wear of Transtibial prosthesis and Transfemoral liner  >80% of awake hours and Transfemoral prosthesis >5hours total / day without skin or limb pain issues.  (All Target Date 11/24/2020)    Time 3    Period Months    Status On-going    Target Date 11/24/20      PT LONG TERM GOAL #2   Title Patient verbalizes and demonstrates understading of proper prosthetic care including donning both prostheses.    Time 3    Period Months    Status On-going    Target Date 11/24/20      PT LONG TERM GOAL #3   Title sit to / from stand from chairs with armrests to RW and maintains upright 2 minutes with supervision.    Time 3    Period Months    Status Revised    Target Date 11/24/20      PT LONG TERM GOAL #4   Title Patient ambulates 14' with RW & bilateral prostheses with minA from nephew.    Time 3    Period Months    Status Revised    Target Date 11/24/20                 Plan - 10/17/20 1258    Clinical Impression Statement PT worked on sit to/from stand, turning 90* right & left (she is better to right than left) and prosthetic gait. Her gait improves  requiring less assistance when she pushes trunk upright upon cues.    Personal Factors and Comorbidities Comorbidity 3+;Education;Fitness;Past/Current Experience;Social Background;Time since onset of injury/illness/exacerbation    Comorbidities Rt TTA, Lt TFA, DM, HTN, PVD, depression, right Carpal Tunnel Release surgery    Examination-Activity Limitations Locomotion Level;Squat;Stand;Transfers;Stairs    Examination-Participation Restrictions  Community Activity    Stability/Clinical Decision Making Evolving/Moderate complexity    Rehab Potential Good    PT Frequency 2x / week    PT Duration 12 weeks    PT Treatment/Interventions ADLs/Self Care Home Management;DME Instruction;Gait training;Stair training;Functional mobility training;Therapeutic activities;Therapeutic exercise;Balance training;Neuromuscular re-education;Patient/family education;Prosthetic Training;Manual techniques;Scar mobilization;Passive range of motion;Vasopneumatic Device;Vestibular    PT Next Visit Plan reauthorization to [facility],  standing & gait with RW, work towards updated STGs.    Consulted and Agree with Plan of Care Patient;Family member/caregiver    Family Member Consulted nephew / caregiver Marikay Alar           Patient will benefit from skilled therapeutic intervention in order to improve the following deficits and impairments:  Abnormal gait,Cardiopulmonary status limiting activity,Decreased activity tolerance,Decreased balance,Decreased endurance,Decreased knowledge of use of DME,Decreased mobility,Decreased range of motion,Decreased skin integrity,Decreased scar mobility,Decreased strength,Dizziness,Increased edema,Impaired flexibility,Postural dysfunction,Prosthetic Dependency,Obesity,Pain  Visit Diagnosis: Muscle weakness (generalized)  Unsteadiness on feet  Other abnormalities of gait and mobility  Abnormal posture  Pain in right hand  Stiffness of left hip, not elsewhere classified     Problem List Patient Active Problem List   Diagnosis Date Noted  . PTSD (post-traumatic stress disorder)   . Mixed stress and urge urinary incontinence 06/11/2019  . Right below-knee amputee 01/23/2019  . Ulnar neuropathy of left upper extremity 10/23/2018  . Pain in finger of right hand 02/20/2018  . Atherosclerosis of native arteries of the extremities with gangrene 11/01/2017  . OSA (obstructive sleep apnea)   . Benign essential HTN   .  DM (diabetes mellitus), secondary, uncontrolled, with peripheral vascular complications   . Hyperkalemia   . Leukocytosis   . Acute blood loss anemia   . Amputation of left lower extremity above knee 08/23/2017  . PAD (peripheral artery disease) 07/23/2017  . Cellulitis 06/10/2017  . Hyponatremia 06/10/2017  . Fever 06/10/2017  . Rash 05/24/2017  . Allergic rhinitis   . Insomnia     Vladimir Faster, PT, DPT 10/17/2020, 2:31 PM  [facility] [address] [place], [place], [address] Phone: [phone]   Fax:  [phone]  Name: Danielle Buchanan MRN: [id number] Date of Birth: May 26, 1956

## 2020-10-19 ENCOUNTER — Other Ambulatory Visit: Payer: Self-pay

## 2020-10-19 ENCOUNTER — Ambulatory Visit: Payer: Medicare HMO | Admitting: Physical Therapy

## 2020-10-19 ENCOUNTER — Encounter: Payer: Self-pay | Admitting: Physical Therapy

## 2020-10-19 DIAGNOSIS — M25652 Stiffness of left hip, not elsewhere classified: Secondary | ICD-10-CM

## 2020-10-19 DIAGNOSIS — R2689 Other abnormalities of gait and mobility: Secondary | ICD-10-CM

## 2020-10-19 DIAGNOSIS — R2681 Unsteadiness on feet: Secondary | ICD-10-CM

## 2020-10-19 DIAGNOSIS — R293 Abnormal posture: Secondary | ICD-10-CM | POA: Diagnosis not present

## 2020-10-19 DIAGNOSIS — M79641 Pain in right hand: Secondary | ICD-10-CM

## 2020-10-19 DIAGNOSIS — M6281 Muscle weakness (generalized): Secondary | ICD-10-CM | POA: Diagnosis not present

## 2020-10-19 NOTE — Therapy (Addendum)
[facility] Physical Therapy [address] [place], [place], [address] Phone: [phone]   Fax:  [phone]  Physical Therapy Treatment & Discharge Summary  Patient Details  Name: Danielle Buchanan MRN: [id number] Date of Birth: 1956-05-20 Referring Provider (PT): Maurice Small, MD   Encounter Date: 10/19/2020   Referring diagnosis? Right Transtibial Amputation Z89.511  & Hx Left Transfermoral Amputation  C58.527 Treatment diagnosis? (if different than referring diagnosis)    ICD-10-CM   PL           1.   Muscle weakness (generalized)  M62.81  Change Dx     2.   Unsteadiness on feet  R26.81  Change Dx     3.   Other abnormalities of gait and mobility  R26.89  Change Dx     4.   Abnormal posture  R29.3  Change Dx     5.   Pain in right hand  M79.641  Change Dx     6.   Stiffness of left hip, not elsewhere classified  M25.652  Change Dx      What was this (referring dx) caused by? '[x]'  Surgery '[]'  Fall '[]'  Ongoing issue '[]'  Arthritis '[]'  Other: ____________  Laterality: '[x]'  Rt '[x]'  Lt '[]'  Both  Check all possible CPT codes:      '[x]'  97110 (Therapeutic Exercise)  '[]'  92507 (SLP Treatment)  '[x]'  78242 (Neuro Re-ed)   '[]'  92526 (Swallowing Treatment)   '[x]'  97116 (Gait Training)   '[]'  [id number] (Cognitive Training, 1st 15 minutes) '[]'  97140 (Manual Therapy)   '[]'  97130 (Cognitive Training, each add'l 15 minutes)  '[x]'  97530 (Therapeutic Activities)  '[]'  Other, List CPT Code ____________    '[x]'  35361 (Self Care)       '[]'  All codes above (97110 - 97535)  '[]'  97012 (Mechanical Traction)  '[]'  97014 (E-stim Unattended)  '[]'  97032 (E-stim manual)  '[]'  97033 (Ionto)  '[]'  97035 (Ultrasound)  '[]'  97760 (Orthotic Fit) '[]'  97750 (Physical Performance Training) '[]'  [id number] (Aquatic Therapy) '[]'  97034 (Contrast Bath) '[]'  97018 (Paraffin) '[]'  97597 (Wound Care 1st 20 sq cm) '[]'  97598 (Wound Care each add'l 20 sq cm) '[]'  97016 (Vasopneumatic Device) '[]'  [id number] Comptroller) '[x]'  [id number]  (Prosthetic Training)     10/19/20 1256  PT Visits / Re-Eval  Visit Number 35  Number of Visits 2  Date for PT Re-Evaluation 11/18/20  Authorization  Authorization Type [employer]  Authorization Time Period $10 co-pay,  09/08/2020 - 10/20/2020  Authorization - Visit Number 12  Authorization - Number of Visits 12  Progress Note Due on Visit 33  PT Time Calculation  PT Start Time 1256  PT Stop Time 1345  PT Time Calculation (min) 49 min  PT - End of Session  Equipment Utilized During Treatment Gait belt  Activity Tolerance Patient tolerated treatment well  Behavior During Therapy Flat affect;WFL for tasks assessed/performed    Past Medical History:  Diagnosis Date  . Allergic rhinitis   . Amputation of left lower extremity above knee 08/23/2017  . Amputation stump infection 08/23/2017  . Atherosclerosis of native arteries of the extremities with gangrene 11/01/2017   2/4-2/15/2020 infected/necrotic third right toe 2/5 transmetatarsal amputation of toes 2-5 with removal of the big toe 2/12 by Dr Trula Slade All margins clean; Bactrim prescribed for Serratia osteomyelitis  . DM (diabetes mellitus), secondary, uncontrolled, with peripheral vascular complications    Current 11/12/2018 A1c 10.9%  . Dyslipidemia   . Essential hypertension, benign   . Gangrene of right  foot 01/17/2019  . Hyperkalemia   . Hyponatremia 06/10/2017  . Insomnia   . Leukocytosis   . Mixed stress and urge urinary incontinence 06/11/2019  . OSA (obstructive sleep apnea)    no cpap  . Osteomyelitis of third toe of right foot 11/12/2018  . PAD (peripheral artery disease) 07/23/2017  . Pain in finger of right hand 02/20/2018  . PTSD (post-traumatic stress disorder)   . Right below-knee amputee 01/23/2019  . Ulnar neuropathy of left upper extremity 10/23/2018    Past Surgical History:  Procedure Laterality Date  . ABDOMINAL AORTOGRAM W/LOWER EXTREMITY N/A 06/24/2017   Procedure: ABDOMINAL AORTOGRAM W/LOWER  EXTREMITY;  Surgeon: Waynetta Sandy, MD;  Location: [place] [facility];  Service: Cardiovascular;  Laterality: N/A;  . ABDOMINAL AORTOGRAM W/LOWER EXTREMITY Right 10/09/2017   Procedure: ABDOMINAL AORTOGRAM W/LOWER EXTREMITY;  Surgeon: Waynetta Sandy, MD;  Location: [place] [facility];  Service: Cardiovascular;  Laterality: Right;  . AMPUTATION Left 07/23/2017   Procedure: AMPUTATION  BELOW KNEE;  Surgeon: Waynetta Sandy, MD;  Location: [place];  Service: Vascular;  Laterality: Left;  . AMPUTATION Left 08/12/2017   Procedure: REVISION BELOW KNEE;  Surgeon: Waynetta Sandy, MD;  Location: [place];  Service: Vascular;  Laterality: Left;  . AMPUTATION Left 08/27/2017   Procedure: left ABOVE KNEE AMPUTATION;  Surgeon: Waynetta Sandy, MD;  Location: [place];  Service: Vascular;  Laterality: Left;  . AMPUTATION Left 08/30/2017   Procedure: REVISION AMPUTATION ABOVE KNEE;  Surgeon: Serafina Mitchell, MD;  Location: [place];  Service: Vascular;  Laterality: Left;  . AMPUTATION Right 11/04/2017   Procedure: AMPUTATION RIGHT FOURTH TOE;  Surgeon: Waynetta Sandy, MD;  Location: [place];  Service: Vascular;  Laterality: Right;  . AMPUTATION Right 11/12/2018   Procedure: AMPUTATION OF second, third and fifth toes;  Surgeon: Serafina Mitchell, MD;  Location: [place];  Service: Vascular;  Laterality: Right;  . AMPUTATION Right 01/19/2019   Procedure: AMPUTATION BELOW KNEE;  Surgeon: Elam Dutch, MD;  Location: [place];  Service: Vascular;  Laterality: Right;  . APPLICATION OF WOUND VAC Left 08/27/2017   Procedure: APPLICATION OF WOUND VAC;  Surgeon: Waynetta Sandy, MD;  Location: [place];  Service: Vascular;  Laterality: Left;  . CARPAL TUNNEL RELEASE Right   . COLONOSCOPY    . LOWER EXTREMITY ANGIOGRAM Right 11/12/2018   Procedure: ANGIOGRAM OF Right LEG;  Surgeon: Serafina Mitchell, MD;  Location: [place];  Service: Vascular;  Laterality: Right;  . LOWER  EXTREMITY INTERVENTION Right 10/09/2017   Procedure: LOWER EXTREMITY INTERVENTION;  Surgeon: Waynetta Sandy, MD;  Location: [place] [facility];  Service: Cardiovascular;  Laterality: Right;  . TOE AMPUTATION Right 11/12/2018   2ND 3RD & 5TH TOE   . TRANSMETATARSAL AMPUTATION Right 11/19/2018   Procedure: TRANSMETATARSAL AMPUTATION REVISION;  Surgeon: Serafina Mitchell, MD;  Location: [place];  Service: Vascular;  Laterality: Right;    There were no vitals filed for this visit.   Subjective Assessment - 10/19/20 1257    Subjective She has been getting out with electric w/c. She is transferring w/c to recliner without issues & uses TTA prosthesis ~1/3 time. Nephew was able to speak with Dr Beverly Gust office & they are looking into home health nursing & therapy.    Patient Stated Goals Walk with 2 prostheses in home mostly and maybe limited community.    Currently in Pain? Yes    Pain Score 10-Worst  pain ever    Pain Location Hand    Pain Orientation Right;Left    Pain Descriptors / Indicators Pins and needles;Tingling    Pain Type Chronic pain;Neuropathic pain    Pain Onset More than a month ago                             [facility] Adult PT Treatment/Exercise - 10/19/20 1258      Transfers   Transfers Sit to Stand;Stand to [employer] Transfers    Sit to Stand 3: Mod assist;4: Min assist;With upper extremity assist;With armrests;From chair/3-in-1;Other (comment)   to RW with bil. prostheses   Sit to Stand Details Tactile cues for weight shifting;Visual cues for safe use of DME/AE;Verbal cues for sequencing;Verbal cues for technique;Verbal cues for safe use of DME/AE;Manual facilitation for weight bearing;Manual facilitation for weight shifting    Sit to Stand Details (indicate cue type and reason) 1st sit to stand modA (PT fully tightened TFA suspension strap);  then 5X sit to stand with minA    Stand to Sit 4: Min assist;4: Min guard;With upper extremity assist;With  armrests;To chair/3-in-1;Other (comment)   from RW with bil. prostheses   Stand to Sit Details (indicate cue type and reason) Tactile cues for weight shifting;Visual cues for safe use of DME/AE;Verbal cues for technique;Verbal cues for safe use of DME/AE;Manual facilitation for weight shifting    Stand Pivot Transfers 4: Min assist;3: Mod assist   MinA to right & modA to left, RW & TTA/TFA bil prostheses   Stand Pivot Transfer Details (indicate cue type and reason) constant verbal & manual cues on technique      Ambulation/Gait   Ambulation/Gait Yes    Ambulation/Gait Assistance 3: Mod assist    Ambulation/Gait Assistance Details verbal, manual & visual (target on RW for step width) on upright posture, step width & wt shift over stance limb.    Ambulation Distance (Feet) 17 Feet   10" with 90* turn to sit,21' straight path max tolerable distance   Assistive device Prostheses;Rolling walker      Knee/Hip Exercises: Aerobic   Nustep BUEs & RLE (TTA) level 6 for 8 minutes      Prosthetics   Prosthetic Care Comments  TTA liners are too large for limb with shrinkage of limb volume & stretching over time of liners.  When liner slips down, it can slip completely off which would result in a fall.    Current prosthetic wear tolerance (#hours/day)  3-5hrs 2-3x/day.  PT recommended wearing liner when napping in recliner and donning prosthesis when getting out of recliner.  Need to remove liner & wear shrinker for 4-6hours /day to enable skin to dry.     Current prosthetic weight-bearing tolerance (hours/day)  No pain or discomfort with standing in //bars.     Edema pitting edema TTA limb    Residual limb condition  right TTA has medium red rash over distal limb but no signs of infection.  bulbous edematous shape.  no open areas.    TFA no issues noted.     Education Provided Other (comment)   see prosthetic care comments.                   PT Short Term Goals - 10/19/20 1340      PT SHORT  TERM GOAL #1   Title Patient reports ability to transfer from her recliner to w/c with TTA proshesis.  Baseline Partially met 10/19/2020  She reports using prosthesis only ~1/3 of time but can do it now.    Time 1    Period Months    Status Partially Met    Target Date 10/28/20      PT SHORT TERM GOAL #2   Title Stand pivot transfer with RW between chairs with armrests to right or left with min A    Baseline Partially MET 10/19/2020 minA to right & modA to left    Time 1    Period Months    Status Partially Met    Target Date 10/28/20      PT SHORT TERM GOAL #3   Title Sit to/from stand 5 reps w/c to RW with minimal assist    Baseline .MET 10/19/2020    Time 1    Period Months    Status Achieved    Target Date 10/28/20      PT SHORT TERM GOAL #4   Title patient ambulates 28' with RW & bilateral prostheses with minA.    Baseline Not MET 10/19/2020  Pt ambulates 21' with RW with modA    Time 1    Period Months    Status Not Met    Target Date 10/28/20             PT Long Term Goals - 08/24/20 1631      PT LONG TERM GOAL #1   Title Patient tolerates wear of Transtibial prosthesis and Transfemoral liner  >80% of awake hours and Transfemoral prosthesis >5hours total / day without skin or limb pain issues.  (All Target Date 11/24/2020)    Time 3    Period Months    Status On-going    Target Date 11/24/20      PT LONG TERM GOAL #2   Title Patient verbalizes and demonstrates understading of proper prosthetic care including donning both prostheses.    Time 3    Period Months    Status On-going    Target Date 11/24/20      PT LONG TERM GOAL #3   Title sit to / from stand from chairs with armrests to RW and maintains upright 2 minutes with supervision.    Time 3    Period Months    Status Revised    Target Date 11/24/20      PT LONG TERM GOAL #4   Title Patient ambulates 54' with RW & bilateral prostheses with minA from nephew.    Time 3    Period Months    Status  Revised    Target Date 11/24/20                 Plan - 10/19/20 1256    Clinical Impression Statement Patient is progressing towards STGs. [employer] authorization ran out today. PT checked STGs 1 week early to show progress with PT.  Current PT POC ends 2/17.  Patient relies on nephew who is her primary caregiver but does not live with her.  Nephew has decided that he is no longer able to assist patient.  They are working to get some Home Health services to aid with her care which appears to be a good ideal for safety & her ability to progress.    Personal Factors and Comorbidities Comorbidity 3+;Education;Fitness;Past/Current Experience;Social Background;Time since onset of injury/illness/exacerbation    Comorbidities Rt TTA, Lt TFA, DM, HTN, PVD, depression, right Carpal Tunnel Release surgery    Examination-Activity Limitations Locomotion Level;Squat;Stand;Transfers;Stairs  Examination-Participation Restrictions Community Activity    Stability/Clinical Decision Making Evolving/Moderate complexity    Rehab Potential Good    PT Frequency 2x / week    PT Duration 12 weeks    PT Treatment/Interventions ADLs/Self Care Home Management;DME Instruction;Gait training;Stair training;Functional mobility training;Therapeutic activities;Therapeutic exercise;Balance training;Neuromuscular re-education;Patient/family education;Prosthetic Training;Manual techniques;Scar mobilization;Passive range of motion;Vasopneumatic Device;Vestibular    PT Next Visit Plan standing & gait with RW, work towards [place].    Consulted and Agree with Plan of Care Patient;Family member/caregiver    Family Member Consulted nephew / caregiver Mora Bellman           Patient will benefit from skilled therapeutic intervention in order to improve the following deficits and impairments:  Abnormal gait,Cardiopulmonary status limiting activity,Decreased activity tolerance,Decreased balance,Decreased endurance,Decreased  knowledge of use of DME,Decreased mobility,Decreased range of motion,Decreased skin integrity,Decreased scar mobility,Decreased strength,Dizziness,Increased edema,Impaired flexibility,Postural dysfunction,Prosthetic Dependency,Obesity,Pain  Visit Diagnosis: Muscle weakness (generalized)  Unsteadiness on feet  Other abnormalities of gait and mobility  Abnormal posture  Pain in right hand  Stiffness of left hip, not elsewhere classified     Problem List Patient Active Problem List   Diagnosis Date Noted  . PTSD (post-traumatic stress disorder)   . Mixed stress and urge urinary incontinence 06/11/2019  . Right below-knee amputee 01/23/2019  . Ulnar neuropathy of left upper extremity 10/23/2018  . Pain in finger of right hand 02/20/2018  . Atherosclerosis of native arteries of the extremities with gangrene 11/01/2017  . OSA (obstructive sleep apnea)   . Benign essential HTN   . DM (diabetes mellitus), secondary, uncontrolled, with peripheral vascular complications   . Hyperkalemia   . Leukocytosis   . Acute blood loss anemia   . Amputation of left lower extremity above knee 08/23/2017  . PAD (peripheral artery disease) 07/23/2017  . Cellulitis 06/10/2017  . Hyponatremia 06/10/2017  . Fever 06/10/2017  . Rash 05/24/2017  . Allergic rhinitis   . Insomnia     Jamey Reas PT DPT 10/19/2020, 1:56 PM  [facility] Physical Therapy [address] [place], [place], [address] Phone: [phone]   Fax:  [phone]  Name: Danielle Buchanan MRN: [id number] Date of Birth: 09-Jan-1956    PHYSICAL THERAPY DISCHARGE SUMMARY  Visits from Start of Care:  35  Current functional level related to goals / functional outcomes: See above   Remaining deficits: See above   Education / Equipment: Prosthetic training & HEP  Plan: Patient agrees to discharge.  Patient goals were not met. Patient is being discharged due to  transfer to [place]. ?????          Jamey Reas, PT, DPT Physical Therapist Specializing in Prosthetic Rehab [facility]. [address] [place], [place] [address] Phone [phone] FAX [phone]

## 2020-10-24 ENCOUNTER — Encounter: Payer: Self-pay | Admitting: Physical Therapy

## 2020-10-26 ENCOUNTER — Encounter: Payer: Self-pay | Admitting: Physical Therapy

## 2020-10-27 DIAGNOSIS — Z4781 Encounter for orthopedic aftercare following surgical amputation: Secondary | ICD-10-CM | POA: Diagnosis not present

## 2020-10-27 DIAGNOSIS — F331 Major depressive disorder, recurrent, moderate: Secondary | ICD-10-CM | POA: Diagnosis not present

## 2020-10-27 DIAGNOSIS — E1151 Type 2 diabetes mellitus with diabetic peripheral angiopathy without gangrene: Secondary | ICD-10-CM | POA: Diagnosis not present

## 2020-10-27 DIAGNOSIS — F5101 Primary insomnia: Secondary | ICD-10-CM | POA: Diagnosis not present

## 2020-10-27 DIAGNOSIS — E782 Mixed hyperlipidemia: Secondary | ICD-10-CM | POA: Diagnosis not present

## 2020-10-27 DIAGNOSIS — E1121 Type 2 diabetes mellitus with diabetic nephropathy: Secondary | ICD-10-CM | POA: Diagnosis not present

## 2020-10-27 DIAGNOSIS — F4312 Post-traumatic stress disorder, chronic: Secondary | ICD-10-CM | POA: Diagnosis not present

## 2020-10-27 DIAGNOSIS — Z794 Long term (current) use of insulin: Secondary | ICD-10-CM | POA: Diagnosis not present

## 2020-10-27 DIAGNOSIS — I10 Essential (primary) hypertension: Secondary | ICD-10-CM | POA: Diagnosis not present

## 2020-10-31 ENCOUNTER — Encounter: Payer: Self-pay | Admitting: Physical Therapy

## 2020-11-02 ENCOUNTER — Encounter: Payer: Self-pay | Admitting: Physical Therapy

## 2020-11-04 DIAGNOSIS — Z0000 Encounter for general adult medical examination without abnormal findings: Secondary | ICD-10-CM | POA: Diagnosis not present

## 2020-11-04 DIAGNOSIS — Z89619 Acquired absence of unspecified leg above knee: Secondary | ICD-10-CM | POA: Diagnosis not present

## 2020-11-04 DIAGNOSIS — F5101 Primary insomnia: Secondary | ICD-10-CM | POA: Diagnosis not present

## 2020-11-04 DIAGNOSIS — I10 Essential (primary) hypertension: Secondary | ICD-10-CM | POA: Diagnosis not present

## 2020-11-04 DIAGNOSIS — E1151 Type 2 diabetes mellitus with diabetic peripheral angiopathy without gangrene: Secondary | ICD-10-CM | POA: Diagnosis not present

## 2020-11-04 DIAGNOSIS — E1121 Type 2 diabetes mellitus with diabetic nephropathy: Secondary | ICD-10-CM | POA: Diagnosis not present

## 2020-11-04 DIAGNOSIS — Z794 Long term (current) use of insulin: Secondary | ICD-10-CM | POA: Diagnosis not present

## 2020-11-04 DIAGNOSIS — F4312 Post-traumatic stress disorder, chronic: Secondary | ICD-10-CM | POA: Diagnosis not present

## 2020-11-04 DIAGNOSIS — Z4781 Encounter for orthopedic aftercare following surgical amputation: Secondary | ICD-10-CM | POA: Diagnosis not present

## 2020-11-04 DIAGNOSIS — E782 Mixed hyperlipidemia: Secondary | ICD-10-CM | POA: Diagnosis not present

## 2020-11-04 DIAGNOSIS — F331 Major depressive disorder, recurrent, moderate: Secondary | ICD-10-CM | POA: Diagnosis not present

## 2020-11-07 ENCOUNTER — Encounter: Payer: Self-pay | Admitting: Physical Therapy

## 2020-11-07 DIAGNOSIS — E1121 Type 2 diabetes mellitus with diabetic nephropathy: Secondary | ICD-10-CM | POA: Diagnosis not present

## 2020-11-07 DIAGNOSIS — F331 Major depressive disorder, recurrent, moderate: Secondary | ICD-10-CM | POA: Diagnosis not present

## 2020-11-07 DIAGNOSIS — E1151 Type 2 diabetes mellitus with diabetic peripheral angiopathy without gangrene: Secondary | ICD-10-CM | POA: Diagnosis not present

## 2020-11-07 DIAGNOSIS — Z794 Long term (current) use of insulin: Secondary | ICD-10-CM | POA: Diagnosis not present

## 2020-11-07 DIAGNOSIS — E782 Mixed hyperlipidemia: Secondary | ICD-10-CM | POA: Diagnosis not present

## 2020-11-07 DIAGNOSIS — F4312 Post-traumatic stress disorder, chronic: Secondary | ICD-10-CM | POA: Diagnosis not present

## 2020-11-07 DIAGNOSIS — I10 Essential (primary) hypertension: Secondary | ICD-10-CM | POA: Diagnosis not present

## 2020-11-07 DIAGNOSIS — F5101 Primary insomnia: Secondary | ICD-10-CM | POA: Diagnosis not present

## 2020-11-07 DIAGNOSIS — Z4781 Encounter for orthopedic aftercare following surgical amputation: Secondary | ICD-10-CM | POA: Diagnosis not present

## 2020-11-09 ENCOUNTER — Encounter: Payer: Self-pay | Admitting: Physical Therapy

## 2020-11-09 DIAGNOSIS — Z4781 Encounter for orthopedic aftercare following surgical amputation: Secondary | ICD-10-CM | POA: Diagnosis not present

## 2020-11-09 DIAGNOSIS — F5101 Primary insomnia: Secondary | ICD-10-CM | POA: Diagnosis not present

## 2020-11-09 DIAGNOSIS — I10 Essential (primary) hypertension: Secondary | ICD-10-CM | POA: Diagnosis not present

## 2020-11-09 DIAGNOSIS — E1121 Type 2 diabetes mellitus with diabetic nephropathy: Secondary | ICD-10-CM | POA: Diagnosis not present

## 2020-11-09 DIAGNOSIS — F4312 Post-traumatic stress disorder, chronic: Secondary | ICD-10-CM | POA: Diagnosis not present

## 2020-11-09 DIAGNOSIS — Z794 Long term (current) use of insulin: Secondary | ICD-10-CM | POA: Diagnosis not present

## 2020-11-09 DIAGNOSIS — F331 Major depressive disorder, recurrent, moderate: Secondary | ICD-10-CM | POA: Diagnosis not present

## 2020-11-09 DIAGNOSIS — E1151 Type 2 diabetes mellitus with diabetic peripheral angiopathy without gangrene: Secondary | ICD-10-CM | POA: Diagnosis not present

## 2020-11-09 DIAGNOSIS — E782 Mixed hyperlipidemia: Secondary | ICD-10-CM | POA: Diagnosis not present

## 2020-11-11 DIAGNOSIS — E782 Mixed hyperlipidemia: Secondary | ICD-10-CM | POA: Diagnosis not present

## 2020-11-11 DIAGNOSIS — F331 Major depressive disorder, recurrent, moderate: Secondary | ICD-10-CM | POA: Diagnosis not present

## 2020-11-11 DIAGNOSIS — Z794 Long term (current) use of insulin: Secondary | ICD-10-CM | POA: Diagnosis not present

## 2020-11-11 DIAGNOSIS — I10 Essential (primary) hypertension: Secondary | ICD-10-CM | POA: Diagnosis not present

## 2020-11-11 DIAGNOSIS — F4312 Post-traumatic stress disorder, chronic: Secondary | ICD-10-CM | POA: Diagnosis not present

## 2020-11-11 DIAGNOSIS — E1151 Type 2 diabetes mellitus with diabetic peripheral angiopathy without gangrene: Secondary | ICD-10-CM | POA: Diagnosis not present

## 2020-11-11 DIAGNOSIS — Z4781 Encounter for orthopedic aftercare following surgical amputation: Secondary | ICD-10-CM | POA: Diagnosis not present

## 2020-11-11 DIAGNOSIS — F5101 Primary insomnia: Secondary | ICD-10-CM | POA: Diagnosis not present

## 2020-11-11 DIAGNOSIS — E1121 Type 2 diabetes mellitus with diabetic nephropathy: Secondary | ICD-10-CM | POA: Diagnosis not present

## 2020-11-14 ENCOUNTER — Encounter: Payer: Self-pay | Admitting: Physical Therapy

## 2020-11-14 DIAGNOSIS — F5101 Primary insomnia: Secondary | ICD-10-CM | POA: Diagnosis not present

## 2020-11-14 DIAGNOSIS — F331 Major depressive disorder, recurrent, moderate: Secondary | ICD-10-CM | POA: Diagnosis not present

## 2020-11-14 DIAGNOSIS — I10 Essential (primary) hypertension: Secondary | ICD-10-CM | POA: Diagnosis not present

## 2020-11-14 DIAGNOSIS — F4312 Post-traumatic stress disorder, chronic: Secondary | ICD-10-CM | POA: Diagnosis not present

## 2020-11-14 DIAGNOSIS — Z794 Long term (current) use of insulin: Secondary | ICD-10-CM | POA: Diagnosis not present

## 2020-11-14 DIAGNOSIS — Z4781 Encounter for orthopedic aftercare following surgical amputation: Secondary | ICD-10-CM | POA: Diagnosis not present

## 2020-11-14 DIAGNOSIS — E782 Mixed hyperlipidemia: Secondary | ICD-10-CM | POA: Diagnosis not present

## 2020-11-14 DIAGNOSIS — E1151 Type 2 diabetes mellitus with diabetic peripheral angiopathy without gangrene: Secondary | ICD-10-CM | POA: Diagnosis not present

## 2020-11-14 DIAGNOSIS — E1121 Type 2 diabetes mellitus with diabetic nephropathy: Secondary | ICD-10-CM | POA: Diagnosis not present

## 2020-11-16 ENCOUNTER — Encounter: Payer: Self-pay | Admitting: Physical Therapy

## 2020-11-16 DIAGNOSIS — F4312 Post-traumatic stress disorder, chronic: Secondary | ICD-10-CM | POA: Diagnosis not present

## 2020-11-16 DIAGNOSIS — E1121 Type 2 diabetes mellitus with diabetic nephropathy: Secondary | ICD-10-CM | POA: Diagnosis not present

## 2020-11-16 DIAGNOSIS — F331 Major depressive disorder, recurrent, moderate: Secondary | ICD-10-CM | POA: Diagnosis not present

## 2020-11-16 DIAGNOSIS — Z4781 Encounter for orthopedic aftercare following surgical amputation: Secondary | ICD-10-CM | POA: Diagnosis not present

## 2020-11-16 DIAGNOSIS — E782 Mixed hyperlipidemia: Secondary | ICD-10-CM | POA: Diagnosis not present

## 2020-11-16 DIAGNOSIS — F5101 Primary insomnia: Secondary | ICD-10-CM | POA: Diagnosis not present

## 2020-11-16 DIAGNOSIS — Z794 Long term (current) use of insulin: Secondary | ICD-10-CM | POA: Diagnosis not present

## 2020-11-16 DIAGNOSIS — E1151 Type 2 diabetes mellitus with diabetic peripheral angiopathy without gangrene: Secondary | ICD-10-CM | POA: Diagnosis not present

## 2020-11-16 DIAGNOSIS — I10 Essential (primary) hypertension: Secondary | ICD-10-CM | POA: Diagnosis not present

## 2020-11-18 DIAGNOSIS — F4312 Post-traumatic stress disorder, chronic: Secondary | ICD-10-CM | POA: Diagnosis not present

## 2020-11-18 DIAGNOSIS — E1121 Type 2 diabetes mellitus with diabetic nephropathy: Secondary | ICD-10-CM | POA: Diagnosis not present

## 2020-11-18 DIAGNOSIS — Z4781 Encounter for orthopedic aftercare following surgical amputation: Secondary | ICD-10-CM | POA: Diagnosis not present

## 2020-11-18 DIAGNOSIS — E782 Mixed hyperlipidemia: Secondary | ICD-10-CM | POA: Diagnosis not present

## 2020-11-18 DIAGNOSIS — I10 Essential (primary) hypertension: Secondary | ICD-10-CM | POA: Diagnosis not present

## 2020-11-18 DIAGNOSIS — Z794 Long term (current) use of insulin: Secondary | ICD-10-CM | POA: Diagnosis not present

## 2020-11-18 DIAGNOSIS — E1151 Type 2 diabetes mellitus with diabetic peripheral angiopathy without gangrene: Secondary | ICD-10-CM | POA: Diagnosis not present

## 2020-11-18 DIAGNOSIS — F331 Major depressive disorder, recurrent, moderate: Secondary | ICD-10-CM | POA: Diagnosis not present

## 2020-11-18 DIAGNOSIS — F5101 Primary insomnia: Secondary | ICD-10-CM | POA: Diagnosis not present

## 2020-11-21 ENCOUNTER — Encounter: Payer: Self-pay | Admitting: Physical Therapy

## 2020-11-22 DIAGNOSIS — Z4781 Encounter for orthopedic aftercare following surgical amputation: Secondary | ICD-10-CM | POA: Diagnosis not present

## 2020-11-22 DIAGNOSIS — E782 Mixed hyperlipidemia: Secondary | ICD-10-CM | POA: Diagnosis not present

## 2020-11-22 DIAGNOSIS — F331 Major depressive disorder, recurrent, moderate: Secondary | ICD-10-CM | POA: Diagnosis not present

## 2020-11-22 DIAGNOSIS — F4312 Post-traumatic stress disorder, chronic: Secondary | ICD-10-CM | POA: Diagnosis not present

## 2020-11-22 DIAGNOSIS — I10 Essential (primary) hypertension: Secondary | ICD-10-CM | POA: Diagnosis not present

## 2020-11-22 DIAGNOSIS — E1121 Type 2 diabetes mellitus with diabetic nephropathy: Secondary | ICD-10-CM | POA: Diagnosis not present

## 2020-11-22 DIAGNOSIS — Z794 Long term (current) use of insulin: Secondary | ICD-10-CM | POA: Diagnosis not present

## 2020-11-22 DIAGNOSIS — F5101 Primary insomnia: Secondary | ICD-10-CM | POA: Diagnosis not present

## 2020-11-22 DIAGNOSIS — E1151 Type 2 diabetes mellitus with diabetic peripheral angiopathy without gangrene: Secondary | ICD-10-CM | POA: Diagnosis not present

## 2020-11-23 ENCOUNTER — Encounter: Payer: Self-pay | Admitting: Physical Therapy

## 2020-11-23 DIAGNOSIS — F5101 Primary insomnia: Secondary | ICD-10-CM | POA: Diagnosis not present

## 2020-11-23 DIAGNOSIS — E782 Mixed hyperlipidemia: Secondary | ICD-10-CM | POA: Diagnosis not present

## 2020-11-23 DIAGNOSIS — Z794 Long term (current) use of insulin: Secondary | ICD-10-CM | POA: Diagnosis not present

## 2020-11-23 DIAGNOSIS — F331 Major depressive disorder, recurrent, moderate: Secondary | ICD-10-CM | POA: Diagnosis not present

## 2020-11-23 DIAGNOSIS — F4312 Post-traumatic stress disorder, chronic: Secondary | ICD-10-CM | POA: Diagnosis not present

## 2020-11-23 DIAGNOSIS — I10 Essential (primary) hypertension: Secondary | ICD-10-CM | POA: Diagnosis not present

## 2020-11-23 DIAGNOSIS — E1121 Type 2 diabetes mellitus with diabetic nephropathy: Secondary | ICD-10-CM | POA: Diagnosis not present

## 2020-11-23 DIAGNOSIS — Z4781 Encounter for orthopedic aftercare following surgical amputation: Secondary | ICD-10-CM | POA: Diagnosis not present

## 2020-11-23 DIAGNOSIS — E1151 Type 2 diabetes mellitus with diabetic peripheral angiopathy without gangrene: Secondary | ICD-10-CM | POA: Diagnosis not present

## 2020-11-26 DIAGNOSIS — E1121 Type 2 diabetes mellitus with diabetic nephropathy: Secondary | ICD-10-CM | POA: Diagnosis not present

## 2020-11-26 DIAGNOSIS — I10 Essential (primary) hypertension: Secondary | ICD-10-CM | POA: Diagnosis not present

## 2020-11-26 DIAGNOSIS — E1151 Type 2 diabetes mellitus with diabetic peripheral angiopathy without gangrene: Secondary | ICD-10-CM | POA: Diagnosis not present

## 2020-11-26 DIAGNOSIS — Z794 Long term (current) use of insulin: Secondary | ICD-10-CM | POA: Diagnosis not present

## 2020-11-26 DIAGNOSIS — F331 Major depressive disorder, recurrent, moderate: Secondary | ICD-10-CM | POA: Diagnosis not present

## 2020-11-26 DIAGNOSIS — F5101 Primary insomnia: Secondary | ICD-10-CM | POA: Diagnosis not present

## 2020-11-26 DIAGNOSIS — Z4781 Encounter for orthopedic aftercare following surgical amputation: Secondary | ICD-10-CM | POA: Diagnosis not present

## 2020-11-26 DIAGNOSIS — F4312 Post-traumatic stress disorder, chronic: Secondary | ICD-10-CM | POA: Diagnosis not present

## 2020-11-26 DIAGNOSIS — E782 Mixed hyperlipidemia: Secondary | ICD-10-CM | POA: Diagnosis not present

## 2020-11-28 DIAGNOSIS — E1121 Type 2 diabetes mellitus with diabetic nephropathy: Secondary | ICD-10-CM | POA: Diagnosis not present

## 2020-11-28 DIAGNOSIS — F331 Major depressive disorder, recurrent, moderate: Secondary | ICD-10-CM | POA: Diagnosis not present

## 2020-11-28 DIAGNOSIS — F4312 Post-traumatic stress disorder, chronic: Secondary | ICD-10-CM | POA: Diagnosis not present

## 2020-11-28 DIAGNOSIS — E1151 Type 2 diabetes mellitus with diabetic peripheral angiopathy without gangrene: Secondary | ICD-10-CM | POA: Diagnosis not present

## 2020-11-28 DIAGNOSIS — Z4781 Encounter for orthopedic aftercare following surgical amputation: Secondary | ICD-10-CM | POA: Diagnosis not present

## 2020-11-28 DIAGNOSIS — I10 Essential (primary) hypertension: Secondary | ICD-10-CM | POA: Diagnosis not present

## 2020-11-28 DIAGNOSIS — Z794 Long term (current) use of insulin: Secondary | ICD-10-CM | POA: Diagnosis not present

## 2020-11-28 DIAGNOSIS — E782 Mixed hyperlipidemia: Secondary | ICD-10-CM | POA: Diagnosis not present

## 2020-11-28 DIAGNOSIS — F5101 Primary insomnia: Secondary | ICD-10-CM | POA: Diagnosis not present

## 2020-12-01 DIAGNOSIS — F5101 Primary insomnia: Secondary | ICD-10-CM | POA: Diagnosis not present

## 2020-12-01 DIAGNOSIS — F331 Major depressive disorder, recurrent, moderate: Secondary | ICD-10-CM | POA: Diagnosis not present

## 2020-12-01 DIAGNOSIS — E782 Mixed hyperlipidemia: Secondary | ICD-10-CM | POA: Diagnosis not present

## 2020-12-01 DIAGNOSIS — E1121 Type 2 diabetes mellitus with diabetic nephropathy: Secondary | ICD-10-CM | POA: Diagnosis not present

## 2020-12-01 DIAGNOSIS — Z794 Long term (current) use of insulin: Secondary | ICD-10-CM | POA: Diagnosis not present

## 2020-12-01 DIAGNOSIS — I10 Essential (primary) hypertension: Secondary | ICD-10-CM | POA: Diagnosis not present

## 2020-12-01 DIAGNOSIS — Z4781 Encounter for orthopedic aftercare following surgical amputation: Secondary | ICD-10-CM | POA: Diagnosis not present

## 2020-12-01 DIAGNOSIS — E1151 Type 2 diabetes mellitus with diabetic peripheral angiopathy without gangrene: Secondary | ICD-10-CM | POA: Diagnosis not present

## 2020-12-01 DIAGNOSIS — F4312 Post-traumatic stress disorder, chronic: Secondary | ICD-10-CM | POA: Diagnosis not present

## 2020-12-05 DIAGNOSIS — F4312 Post-traumatic stress disorder, chronic: Secondary | ICD-10-CM | POA: Diagnosis not present

## 2020-12-05 DIAGNOSIS — Z4781 Encounter for orthopedic aftercare following surgical amputation: Secondary | ICD-10-CM | POA: Diagnosis not present

## 2020-12-05 DIAGNOSIS — E1121 Type 2 diabetes mellitus with diabetic nephropathy: Secondary | ICD-10-CM | POA: Diagnosis not present

## 2020-12-05 DIAGNOSIS — Z794 Long term (current) use of insulin: Secondary | ICD-10-CM | POA: Diagnosis not present

## 2020-12-05 DIAGNOSIS — I10 Essential (primary) hypertension: Secondary | ICD-10-CM | POA: Diagnosis not present

## 2020-12-05 DIAGNOSIS — Z89619 Acquired absence of unspecified leg above knee: Secondary | ICD-10-CM | POA: Diagnosis not present

## 2020-12-05 DIAGNOSIS — F5101 Primary insomnia: Secondary | ICD-10-CM | POA: Diagnosis not present

## 2020-12-05 DIAGNOSIS — E782 Mixed hyperlipidemia: Secondary | ICD-10-CM | POA: Diagnosis not present

## 2020-12-05 DIAGNOSIS — E1151 Type 2 diabetes mellitus with diabetic peripheral angiopathy without gangrene: Secondary | ICD-10-CM | POA: Diagnosis not present

## 2020-12-05 DIAGNOSIS — F331 Major depressive disorder, recurrent, moderate: Secondary | ICD-10-CM | POA: Diagnosis not present

## 2020-12-07 DIAGNOSIS — Z4781 Encounter for orthopedic aftercare following surgical amputation: Secondary | ICD-10-CM | POA: Diagnosis not present

## 2020-12-07 DIAGNOSIS — F4312 Post-traumatic stress disorder, chronic: Secondary | ICD-10-CM | POA: Diagnosis not present

## 2020-12-07 DIAGNOSIS — F5101 Primary insomnia: Secondary | ICD-10-CM | POA: Diagnosis not present

## 2020-12-07 DIAGNOSIS — E1151 Type 2 diabetes mellitus with diabetic peripheral angiopathy without gangrene: Secondary | ICD-10-CM | POA: Diagnosis not present

## 2020-12-07 DIAGNOSIS — F331 Major depressive disorder, recurrent, moderate: Secondary | ICD-10-CM | POA: Diagnosis not present

## 2020-12-07 DIAGNOSIS — E1121 Type 2 diabetes mellitus with diabetic nephropathy: Secondary | ICD-10-CM | POA: Diagnosis not present

## 2020-12-07 DIAGNOSIS — E782 Mixed hyperlipidemia: Secondary | ICD-10-CM | POA: Diagnosis not present

## 2020-12-07 DIAGNOSIS — Z794 Long term (current) use of insulin: Secondary | ICD-10-CM | POA: Diagnosis not present

## 2020-12-07 DIAGNOSIS — I10 Essential (primary) hypertension: Secondary | ICD-10-CM | POA: Diagnosis not present

## 2020-12-23 DIAGNOSIS — F4312 Post-traumatic stress disorder, chronic: Secondary | ICD-10-CM | POA: Diagnosis not present

## 2020-12-23 DIAGNOSIS — E1121 Type 2 diabetes mellitus with diabetic nephropathy: Secondary | ICD-10-CM | POA: Diagnosis not present

## 2020-12-23 DIAGNOSIS — Z794 Long term (current) use of insulin: Secondary | ICD-10-CM | POA: Diagnosis not present

## 2020-12-23 DIAGNOSIS — E782 Mixed hyperlipidemia: Secondary | ICD-10-CM | POA: Diagnosis not present

## 2020-12-23 DIAGNOSIS — F5101 Primary insomnia: Secondary | ICD-10-CM | POA: Diagnosis not present

## 2020-12-23 DIAGNOSIS — F331 Major depressive disorder, recurrent, moderate: Secondary | ICD-10-CM | POA: Diagnosis not present

## 2020-12-23 DIAGNOSIS — I10 Essential (primary) hypertension: Secondary | ICD-10-CM | POA: Diagnosis not present

## 2020-12-23 DIAGNOSIS — Z4781 Encounter for orthopedic aftercare following surgical amputation: Secondary | ICD-10-CM | POA: Diagnosis not present

## 2020-12-23 DIAGNOSIS — E1151 Type 2 diabetes mellitus with diabetic peripheral angiopathy without gangrene: Secondary | ICD-10-CM | POA: Diagnosis not present

## 2021-01-02 DIAGNOSIS — Z89619 Acquired absence of unspecified leg above knee: Secondary | ICD-10-CM | POA: Diagnosis not present

## 2021-02-02 DIAGNOSIS — Z89619 Acquired absence of unspecified leg above knee: Secondary | ICD-10-CM | POA: Diagnosis not present

## 2021-03-01 DIAGNOSIS — R350 Frequency of micturition: Secondary | ICD-10-CM | POA: Diagnosis not present

## 2021-03-01 DIAGNOSIS — R3915 Urgency of urination: Secondary | ICD-10-CM | POA: Diagnosis not present

## 2021-03-01 DIAGNOSIS — N3941 Urge incontinence: Secondary | ICD-10-CM | POA: Diagnosis not present

## 2021-03-04 DIAGNOSIS — Z89619 Acquired absence of unspecified leg above knee: Secondary | ICD-10-CM | POA: Diagnosis not present

## 2021-04-04 DIAGNOSIS — Z89619 Acquired absence of unspecified leg above knee: Secondary | ICD-10-CM | POA: Diagnosis not present

## 2021-05-04 DIAGNOSIS — Z89619 Acquired absence of unspecified leg above knee: Secondary | ICD-10-CM | POA: Diagnosis not present

## 2021-05-23 DIAGNOSIS — R627 Adult failure to thrive: Secondary | ICD-10-CM | POA: Diagnosis not present

## 2021-05-23 DIAGNOSIS — Z7189 Other specified counseling: Secondary | ICD-10-CM | POA: Diagnosis not present

## 2021-06-04 DIAGNOSIS — Z89619 Acquired absence of unspecified leg above knee: Secondary | ICD-10-CM | POA: Diagnosis not present
